# Patient Record
Sex: Female | Born: 1995 | Race: White | Hispanic: No | Smoking: Current every day smoker
Health system: Southern US, Community
[De-identification: ages and names within clinical notes are randomized; demographics above are authoritative.]

## PROBLEM LIST (undated history)

## (undated) DIAGNOSIS — T50901A Poisoning by unspecified drugs, medicaments and biological substances, accidental (unintentional), initial encounter: Secondary | ICD-10-CM

## (undated) DIAGNOSIS — G43909 Migraine, unspecified, not intractable, without status migrainosus: Secondary | ICD-10-CM

## (undated) DIAGNOSIS — F419 Anxiety disorder, unspecified: Secondary | ICD-10-CM

## (undated) DIAGNOSIS — K219 Gastro-esophageal reflux disease without esophagitis: Secondary | ICD-10-CM

## (undated) DIAGNOSIS — F32A Depression, unspecified: Secondary | ICD-10-CM

## (undated) DIAGNOSIS — F1110 Opioid abuse, uncomplicated: Secondary | ICD-10-CM

## (undated) DIAGNOSIS — R51 Headache: Secondary | ICD-10-CM

## (undated) DIAGNOSIS — A749 Chlamydial infection, unspecified: Secondary | ICD-10-CM

## (undated) DIAGNOSIS — I639 Cerebral infarction, unspecified: Secondary | ICD-10-CM

## (undated) DIAGNOSIS — F329 Major depressive disorder, single episode, unspecified: Secondary | ICD-10-CM

## (undated) DIAGNOSIS — R519 Headache, unspecified: Secondary | ICD-10-CM

## (undated) DIAGNOSIS — N179 Acute kidney failure, unspecified: Secondary | ICD-10-CM

## (undated) HISTORY — DX: Opioid abuse, uncomplicated: F11.10

## (undated) HISTORY — DX: Chlamydial infection, unspecified: A74.9

## (undated) HISTORY — DX: Cerebral infarction, unspecified: I63.9

## (undated) HISTORY — PX: TONGUE SURGERY: SHX810

---
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
## 2015-07-13 ENCOUNTER — Emergency Department (HOSPITAL_COMMUNITY)
Admission: EM | Admit: 2015-07-13 | Discharge: 2015-07-13 | Disposition: A | Discharge status: Home / Self Care | Payer: 59 | Attending: Emergency Medicine | Admitting: Emergency Medicine

## 2015-07-13 ENCOUNTER — Encounter (HOSPITAL_COMMUNITY): Payer: Self-pay | Admitting: Emergency Medicine

## 2015-07-13 DIAGNOSIS — F17210 Nicotine dependence, cigarettes, uncomplicated: Secondary | ICD-10-CM | POA: Insufficient documentation

## 2015-07-13 DIAGNOSIS — M2560 Stiffness of unspecified joint, not elsewhere classified: Secondary | ICD-10-CM

## 2015-07-13 DIAGNOSIS — M542 Cervicalgia: Secondary | ICD-10-CM | POA: Diagnosis not present

## 2015-07-13 DIAGNOSIS — R200 Anesthesia of skin: Secondary | ICD-10-CM

## 2015-07-13 DIAGNOSIS — M25561 Pain in right knee: Secondary | ICD-10-CM | POA: Diagnosis present

## 2015-07-13 DIAGNOSIS — M25571 Pain in right ankle and joints of right foot: Secondary | ICD-10-CM | POA: Diagnosis not present

## 2015-07-13 DIAGNOSIS — M2550 Pain in unspecified joint: Secondary | ICD-10-CM

## 2015-07-13 DIAGNOSIS — M79601 Pain in right arm: Secondary | ICD-10-CM | POA: Diagnosis not present

## 2015-07-13 LAB — BASIC METABOLIC PANEL
ANION GAP: 5 (ref 5–15)
BUN: 12 mg/dL (ref 6–20)
CHLORIDE: 108 mmol/L (ref 101–111)
CO2: 26 mmol/L (ref 22–32)
Calcium: 9 mg/dL (ref 8.9–10.3)
Creatinine, Ser: 0.59 mg/dL (ref 0.44–1.00)
GFR calc non Af Amer: >60 mL/min (ref >60)
Glucose, Bld: 122 mg/dL — ABNORMAL HIGH (ref 65–99)
Potassium: 3.7 mmol/L (ref 3.5–5.1)
Sodium: 139 mmol/L (ref 135–145)

## 2015-07-13 MED ORDER — IBUPROFEN 200 MG PO TABS
600.0000 mg | ORAL_TABLET | Freq: Four times a day (QID) | ORAL | Status: DC | PRN
Start: 2015-07-13 — End: 2015-07-13
  Administered 2015-07-13: 600 mg via ORAL
  Filled 2015-07-13: qty 3

## 2015-07-13 MED ORDER — IBUPROFEN 600 MG PO TABS: 600.0000 mg | ORAL_TABLET | Freq: Four times a day (QID) | ORAL | Status: DC | PRN

## 2015-07-13 MED ORDER — PREDNISONE 20 MG PO TABS: 40.0000 mg | ORAL_TABLET | Freq: Every day | ORAL | Status: DC

## 2015-07-13 NOTE — ED Provider Notes (Signed)
CSN: [id number]     Arrival date & time 07/13/15  6962 History   None    Chief Complaint  Patient presents with  . Numbness  . Joint Pain     (Consider location/radiation/quality/duration/timing/severity/associated sxs/prior Treatment) The history is provided by the patient.  Patient is a 20 yo F with no significant PMHx presenting to the ED complaining of numbness and stiffness in her fingers bilaterally for the past 1 month. States she is also experiencing intermittent right arm pain and numbness. Denies history of fall or trauma. Also reports having right knee and ankle pain and stiffness for a week. Denies fevers or chills. No prior history of joint problems. Denies any family history of rheumatological disorders. States she has had back problems since childhood because her "one hip is higher than the other" and she used to see a chiropracter regularly. Patient reports being sexually active with her boyfriend and does not use condoms. She denies having any vaginal discharge, odor, dysuria, or dyspareunia.   History reviewed. No pertinent past medical history.  Past Surgical History  Procedure Laterality Date  . Appendectomy    . Mouth surgery     Family History  Problem Relation Age of Onset  . Anemia Father    Social History  Substance Use Topics  . Smoking status: Current Every Day Smoker -- 1.00 packs/day for 4 years    Types: Cigarettes  . Smokeless tobacco: None  . Alcohol Use: No   OB History    No data available     Review of Systems  Constitutional: Negative for fever and chills.  HENT: Negative for congestion and sore throat.   Eyes: Negative for pain and visual disturbance.  Respiratory: Negative for cough, shortness of breath and wheezing.   Cardiovascular: Negative for chest pain and leg swelling.  Gastrointestinal: Negative for nausea and vomiting.  Genitourinary: Negative for flank pain and difficulty urinating.  Musculoskeletal: Positive for back pain,  arthralgias, neck pain and neck stiffness. Negative for joint swelling.  Skin: Negative for pallor and rash.  Neurological: Positive for numbness. Negative for dizziness.    Allergies  Other and Sulfa antibiotics  Home Medications   Prior to Admission medications   Medication Sig Start Date End Date Taking? Authorizing Provider  HYDROcodone-acetaminophen (NORCO) 7.5-325 MG tablet Take 1 tablet by mouth daily as needed for moderate pain.   Yes Historical Provider, MD  ranitidine (ZANTAC) 150 MG tablet Take 150 mg by mouth daily as needed for heartburn.   Yes Historical Provider, MD  ibuprofen (ADVIL,MOTRIN) 600 MG tablet Take 1 tablet (600 mg total) by mouth every 6 (six) hours as needed for moderate pain. 07/13/15   John Giovanni, MD  predniSONE (DELTASONE) 20 MG tablet Take 2 tablets (40 mg total) by mouth daily with breakfast. 07/13/15   John Giovanni, MD   BP 102/62 mmHg  Pulse 94  Temp(Src) 98.3 F (36.8 C) (Oral)  Resp 17  Ht  (1.575 m)  Wt 52.164 kg  BMI 21.03 kg/m2  SpO2 100%  LMP 06/25/2015 Physical Exam  Constitutional: She is oriented to person, place, and time. She appears well-developed and well-nourished. No distress.  HENT:  Head: Normocephalic and atraumatic.  Mouth/Throat: Oropharynx is clear and moist.  Eyes: EOM are normal. Pupils are equal, round, and reactive to light.  Neck: Normal range of motion. Neck supple.  Cardiovascular: Normal rate, regular rhythm and intact distal pulses.   Pulmonary/Chest: Effort normal and breath sounds normal. No respiratory  distress. She has no wheezes. She has no rales.  Abdominal: Soft. Bowel sounds are normal. She exhibits no distension. There is no tenderness.  Musculoskeletal:  Right upper extremity: Decreased ROM of shoulder and elbow joints. No erythema, increased warmth, or swelling of the joints. Decreased sensation to light touch. Hands: No erythema, increased warmth, or swelling of the joints. Grip strength  4/5 in the right hand and 5/5 in the left hand.  Knees: Normal normal ROM. No erythema, increased warmth, or swelling.   Ankle joints: No erythema, increased warmth, or swelling.  Strength 5/5 and sensation to light touch grossly intact in bilateral lower extremities.   Neurological: She is alert and oriented to person, place, and time. No cranial nerve deficit.  Skin: Skin is warm and dry.    ED Course  Procedures (including critical care time) Labs Review Labs Reviewed  BASIC METABOLIC PANEL - Abnormal; Notable for the following:    Glucose, Bld 122 (*)    All other components within normal limits    Imaging Review No results found. I have personally reviewed and evaluated these images and lab results as part of my medical decision-making.   EKG Interpretation None      MDM   Final diagnoses:  Joint pain  Numbness of arm  Stiffness in joint   Diffuse joint pains and numbness of the right upper extremity  Patient is complaining of numbness and stiffness in her fingers. Also experiencing intermittent right arm pain and numbness. In addition, right knee and ankle pain and stiffness. Denies fevers or chills. No prior history of joint problems. She is currently sexually active with her boyfriend and does not use condoms. However, denies having any vaginal discharge, odor, dysuria, or dyspareunia. BMP showing normal potassium. Her symptoms are concerning for a rheumatologic disorder. -Ibuprofen 600 mg q6 prn pain -Prednisone 40 mg daily for 5 days  -Patient has been advised to follow up with rheumatology as outpatient for further workup -She does mention having back problems/ malalignment since childhood, as such, has been advised to follow-up with orthopedics as outpatient.   John GiovanniVasundhra Duffy Dantonio, MD 07/13/15 1132  John GiovanniVasundhra Maryse Brierley, MD 07/13/15 [id number]  John GiovanniVasundhra Jaydrian Corpening, MD 07/13/15 1223  Blane OharaJoshua Zavitz, MD 07/13/15 1432

## 2015-07-13 NOTE — ED Notes (Signed)
Patient c/o bilat hand numbness that has been going on over a month occuring in mornings for couple hours after waking up then now intermittently during the day. Patient states that she is also having joint pain--hips, ankles, knees.

## 2015-07-13 NOTE — Discharge Instructions (Signed)
Take Ibuprofen as instructed for pain.  Take Prednisone 40 mg daily for 5 days as instructed.   Please call and make an appointment with:  [facility] [address] [place], [place] [address] [phone]  [facility] [address] [place], [place] [address]  [phone]  Joint Pain Joint pain, which is also called arthralgia, can be caused by many things. Joint pain often goes away when you follow your health care provider's instructions for relieving pain at home. However, joint pain can also be caused by conditions that require further treatment. Common causes of joint pain include:  Bruising in the area of the joint.  Overuse of the joint.  Wear and tear on the joints that occur with aging (osteoarthritis).  Various other forms of arthritis.  A buildup of a crystal form of uric acid in the joint (gout).  Infections of the joint (septic arthritis) or of the bone (osteomyelitis). Your health care provider may recommend medicine to help with the pain. If your joint pain continues, additional tests may be needed to diagnose your condition. HOME CARE INSTRUCTIONS Watch your condition for any changes. Follow these instructions as directed to lessen the pain that you are feeling.  Take medicines only as directed by your health care provider.  Rest the affected area for as long as your health care provider says that you should. If directed to do so, raise the painful joint above the level of your heart while you are sitting or lying down.  Do not do things that cause or worsen pain.  If directed, apply ice to the painful area:  Put ice in a plastic bag.  Place a towel between your skin and the bag.  Leave the ice on for 20 minutes, 2-3 times per day.  Wear an elastic bandage, splint, or sling as directed by your health care provider. Loosen the elastic bandage or splint if your fingers or toes become numb and tingle, or if  they turn cold and blue.  Begin exercising or stretching the affected area as directed by your health care provider. Ask your health care provider what types of exercise are safe for you.  Keep all follow-up visits as directed by your health care provider. This is important. SEEK MEDICAL CARE IF:  Your pain increases, and medicine does not help.  Your joint pain does not improve within 3 days.  You have increased bruising or swelling.  You have a fever.  You lose 10 lb (4.5 kg) or more without trying. SEEK IMMEDIATE MEDICAL CARE IF:  You are not able to move the joint.  Your fingers or toes become numb or they turn cold and blue.   This information is not intended to replace advice given to you by your health care provider. Make sure you discuss any questions you have with your health care provider.   Document Released: 01/27/2005 Document Revised: 02/17/2014 Document Reviewed: 11/08/2013 Elsevier Interactive Patient Education [employer]2016 [employer]

## 2015-12-16 ENCOUNTER — Emergency Department (HOSPITAL_COMMUNITY): Payer: 59

## 2015-12-16 ENCOUNTER — Emergency Department (HOSPITAL_COMMUNITY)
Admission: EM | Admit: 2015-12-16 | Discharge: 2015-12-16 | Disposition: A | Discharge status: Home / Self Care | Payer: 59 | Attending: Emergency Medicine | Admitting: Emergency Medicine

## 2015-12-16 ENCOUNTER — Encounter (HOSPITAL_COMMUNITY): Payer: Self-pay

## 2015-12-16 DIAGNOSIS — Y9389 Activity, other specified: Secondary | ICD-10-CM | POA: Insufficient documentation

## 2015-12-16 DIAGNOSIS — F17210 Nicotine dependence, cigarettes, uncomplicated: Secondary | ICD-10-CM | POA: Diagnosis not present

## 2015-12-16 DIAGNOSIS — Y999 Unspecified external cause status: Secondary | ICD-10-CM | POA: Insufficient documentation

## 2015-12-16 DIAGNOSIS — S39012A Strain of muscle, fascia and tendon of lower back, initial encounter: Secondary | ICD-10-CM | POA: Diagnosis not present

## 2015-12-16 DIAGNOSIS — S199XXA Unspecified injury of neck, initial encounter: Secondary | ICD-10-CM | POA: Diagnosis present

## 2015-12-16 DIAGNOSIS — Y92410 Unspecified street and highway as the place of occurrence of the external cause: Secondary | ICD-10-CM | POA: Diagnosis not present

## 2015-12-16 DIAGNOSIS — S161XXA Strain of muscle, fascia and tendon at neck level, initial encounter: Secondary | ICD-10-CM | POA: Diagnosis not present

## 2015-12-16 LAB — I-STAT BETA HCG BLOOD, ED (MC, WL, AP ONLY)

## 2015-12-16 MED ORDER — HYDROMORPHONE HCL 2 MG/ML IJ SOLN
0.5000 mg | Freq: Once | INTRAMUSCULAR | Status: AC
  Administered 2015-12-16: 0.5 mg via INTRAVENOUS
  Filled 2015-12-16: qty 1

## 2015-12-16 MED ORDER — ONDANSETRON HCL 4 MG/2ML IJ SOLN
4.0000 mg | Freq: Once | INTRAMUSCULAR | Status: AC
  Administered 2015-12-16: 4 mg via INTRAVENOUS
  Filled 2015-12-16: qty 2

## 2015-12-16 NOTE — ED Notes (Signed)
Patient transported to CT 

## 2015-12-16 NOTE — ED Notes (Signed)
Xray notified Asher MuirJamie RN that negative pregnancy needed to complete tests. I-stat ordered.

## 2015-12-16 NOTE — ED Notes (Signed)
Pt to radiology via stretcher.  

## 2015-12-16 NOTE — ED Notes (Signed)
ED Provider at bedside. 
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
## 2015-12-18 ENCOUNTER — Emergency Department (HOSPITAL_COMMUNITY)
Admission: EM | Admit: 2015-12-18 | Discharge: 2015-12-18 | Disposition: A | Discharge status: Home / Self Care | Payer: 59 | Attending: Emergency Medicine | Admitting: Emergency Medicine

## 2015-12-18 ENCOUNTER — Encounter (HOSPITAL_COMMUNITY): Payer: Self-pay | Admitting: Emergency Medicine

## 2015-12-18 ENCOUNTER — Emergency Department (HOSPITAL_COMMUNITY): Payer: 59

## 2015-12-18 DIAGNOSIS — Y939 Activity, unspecified: Secondary | ICD-10-CM | POA: Diagnosis not present

## 2015-12-18 DIAGNOSIS — F17210 Nicotine dependence, cigarettes, uncomplicated: Secondary | ICD-10-CM | POA: Insufficient documentation

## 2015-12-18 DIAGNOSIS — S0990XA Unspecified injury of head, initial encounter: Secondary | ICD-10-CM | POA: Diagnosis not present

## 2015-12-18 DIAGNOSIS — F0781 Postconcussional syndrome: Secondary | ICD-10-CM | POA: Diagnosis not present

## 2015-12-18 DIAGNOSIS — Y92410 Unspecified street and highway as the place of occurrence of the external cause: Secondary | ICD-10-CM | POA: Diagnosis not present

## 2015-12-18 DIAGNOSIS — Y999 Unspecified external cause status: Secondary | ICD-10-CM | POA: Insufficient documentation

## 2015-12-18 MED ORDER — PROCHLORPERAZINE EDISYLATE 5 MG/ML IJ SOLN
10.0000 mg | Freq: Once | INTRAMUSCULAR | Status: AC
  Administered 2015-12-18: 10 mg via INTRAVENOUS
  Filled 2015-12-18: qty 2

## 2015-12-18 MED ORDER — DIPHENHYDRAMINE HCL 50 MG/ML IJ SOLN
12.5000 mg | Freq: Once | INTRAMUSCULAR | Status: AC
  Administered 2015-12-18: 12.5 mg via INTRAVENOUS
  Filled 2015-12-18: qty 1
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
## 2015-12-18 NOTE — Discharge Instructions (Signed)
Follow up with your primary care provider or with neurology for re-evaluation if your symptoms do not improve. Avoid strenuous activity until symptoms resolve. Return to the ED if you experience severe worsening of your symptoms, vomiting, severe worsening of your headache, change in vision.
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
## 2015-12-18 NOTE — ED Notes (Signed)
Patient transported to CT 

## 2015-12-18 NOTE — ED Notes (Signed)
Pt returned from CT °

## 2016-02-02 ENCOUNTER — Encounter (HOSPITAL_COMMUNITY): Payer: Self-pay | Admitting: Emergency Medicine

## 2016-02-02 ENCOUNTER — Emergency Department (HOSPITAL_COMMUNITY)
Admission: EM | Admit: 2016-02-02 | Discharge: 2016-02-03 | Disposition: A | Discharge status: Home / Self Care | Payer: 59 | Attending: Emergency Medicine | Admitting: Emergency Medicine

## 2016-02-02 DIAGNOSIS — Z202 Contact with and (suspected) exposure to infections with a predominantly sexual mode of transmission: Secondary | ICD-10-CM | POA: Insufficient documentation

## 2016-02-02 DIAGNOSIS — R112 Nausea with vomiting, unspecified: Secondary | ICD-10-CM | POA: Diagnosis not present

## 2016-02-02 DIAGNOSIS — R1030 Lower abdominal pain, unspecified: Secondary | ICD-10-CM

## 2016-02-02 DIAGNOSIS — F17210 Nicotine dependence, cigarettes, uncomplicated: Secondary | ICD-10-CM | POA: Diagnosis not present

## 2016-02-02 DIAGNOSIS — R197 Diarrhea, unspecified: Secondary | ICD-10-CM | POA: Insufficient documentation

## 2016-02-02 DIAGNOSIS — Z711 Person with feared health complaint in whom no diagnosis is made: Secondary | ICD-10-CM

## 2016-02-02 LAB — COMPREHENSIVE METABOLIC PANEL
ALT: 11 U/L — AB (ref 14–54)
AST: 14 U/L — AB (ref 15–41)
Albumin: 4.5 g/dL (ref 3.5–5.0)
Alkaline Phosphatase: 64 U/L (ref 38–126)
Anion gap: 6 (ref 5–15)
BUN: 17 mg/dL (ref 6–20)
CHLORIDE: 103 mmol/L (ref 101–111)
CO2: 24 mmol/L (ref 22–32)
CREATININE: 0.66 mg/dL (ref 0.44–1.00)
Calcium: 9 mg/dL (ref 8.9–10.3)
GFR calc non Af Amer: >60 mL/min (ref >60)
Glucose, Bld: 90 mg/dL (ref 65–99)
POTASSIUM: 3.8 mmol/L (ref 3.5–5.1)
SODIUM: 133 mmol/L — AB (ref 135–145)
Total Bilirubin: 1.5 mg/dL — ABNORMAL HIGH (ref 0.3–1.2)
Total Protein: 7 g/dL (ref 6.5–8.1)

## 2016-02-02 LAB — URINALYSIS, ROUTINE W REFLEX MICROSCOPIC
BACTERIA UA: NONE SEEN
BILIRUBIN URINE: NEGATIVE
Glucose, UA: NEGATIVE mg/dL
HGB URINE DIPSTICK: NEGATIVE
KETONES UR: 5 mg/dL — AB
Leukocytes, UA: NEGATIVE
Nitrite: NEGATIVE
PROTEIN: 100 mg/dL — AB
SPECIFIC GRAVITY, URINE: 1.03 (ref 1.005–1.030)
pH: 5 (ref 5.0–8.0)

## 2016-02-02 LAB — CBC
HEMATOCRIT: 38.4 % (ref 36.0–46.0)
Hemoglobin: 13 g/dL (ref 12.0–15.0)
MCH: 30.2 pg (ref 26.0–34.0)
MCHC: 33.9 g/dL (ref 30.0–36.0)
MCV: 89.3 fL (ref 78.0–100.0)
PLATELETS: 266 10*3/uL (ref 150–400)
RBC: 4.3 MIL/uL (ref 3.87–5.11)
RDW: 13 % (ref 11.5–15.5)
WBC: 11.9 10*3/uL — ABNORMAL HIGH (ref 4.0–10.5)

## 2016-02-02 LAB — I-STAT BETA HCG BLOOD, ED (MC, WL, AP ONLY)

## 2016-02-02 LAB — LIPASE, BLOOD: LIPASE: 19 U/L (ref 11–51)

## 2016-02-02 MED ORDER — DICYCLOMINE HCL 10 MG PO CAPS
10.0000 mg | ORAL_CAPSULE | Freq: Once | ORAL | Status: AC
  Administered 2016-02-02: 10 mg via ORAL
  Filled 2016-02-02: qty 1

## 2016-02-02 MED ORDER — SODIUM CHLORIDE 0.9 % IV BOLUS (SEPSIS)
1000.0000 mL | Freq: Once | INTRAVENOUS | Status: AC
  Administered 2016-02-02: 1000 mL via INTRAVENOUS

## 2016-02-02 MED ORDER — ONDANSETRON HCL 4 MG/2ML IJ SOLN
4.0000 mg | Freq: Once | INTRAMUSCULAR | Status: AC
  Administered 2016-02-02: 4 mg via INTRAVENOUS
  Filled 2016-02-02: qty 2

## 2016-02-02 NOTE — ED Provider Notes (Signed)
[facility] Provider Note   CSN: [id number] Arrival date & time: 02/02/16  2044     History   Chief Complaint Chief Complaint  Patient presents with  . Abdominal Pain    HPI Jane Watkins is a 20 y.o. female.  Jane Watkins is a 20 y.o. Female who presents to the ED with her boyfriend complaining of suprapubic abdominal pain ongoing for the past 5 days. She reports nausea with this pain and has had one episode of vomiting today. She also reports 2 episodes of diarrhea each day over the past 5 days. Previous abdominal surgery includes an appendectomy. Patient reports she is sexually active and does not use protection. She denies any vaginal bleeding or vaginal discharge. She reports having nausea earlier but ate some cheese-its which made her nausea better. Patient denies fevers, vaginal bleeding, vaginal discharge, urinary symptoms, hematemesis, hematochezia, recent antibiotic use, or rashes.   The history is provided by the patient. No language interpreter was used.  Abdominal Pain   Associated symptoms include diarrhea, nausea and vomiting. Pertinent negatives include fever, dysuria, frequency, hematuria and headaches.    History reviewed. No pertinent past medical history.  There are no active problems to display for this patient.   Past Surgical History:  Procedure Laterality Date  . APPENDECTOMY    . MOUTH SURGERY      OB History    No data available       Home Medications    Prior to Admission medications   Medication Sig Start Date End Date Taking? Authorizing Provider  ibuprofen (ADVIL,MOTRIN) 600 MG tablet Take 1 tablet (600 mg total) by mouth every 6 (six) hours as needed for moderate pain. 07/13/15  Yes John Giovanni, MD  dicyclomine (BENTYL) 20 MG tablet Take 1 tablet (20 mg total) by mouth 2 (two) times daily. 02/03/16   Everlene Farrier, PA-C  FLUoxetine (PROZAC) 20 MG tablet Take 30 mg by mouth daily.    Historical Provider, MD    HYDROcodone-acetaminophen (NORCO) 7.5-325 MG tablet Take 1 tablet by mouth daily as needed for moderate pain.    Historical Provider, MD  Lactobacillus (ACIDOPHILUS PROBIOTIC) 10 MG TABS Take 10 mg by mouth 3 (three) times daily. 02/03/16   Everlene Farrier, PA-C  metroNIDAZOLE (FLAGYL) 500 MG tablet Take 1 tablet (500 mg total) by mouth 2 (two) times daily. 02/03/16   Everlene Farrier, PA-C  ondansetron (ZOFRAN ODT) 4 MG disintegrating tablet Take 1 tablet (4 mg total) by mouth every 8 (eight) hours as needed for nausea or vomiting. 02/03/16   Everlene Farrier, PA-C  predniSONE (DELTASONE) 20 MG tablet Take 2 tablets (40 mg total) by mouth daily with breakfast. 07/13/15   John Giovanni, MD  ranitidine (ZANTAC) 150 MG tablet Take 150 mg by mouth daily as needed for heartburn.    Historical Provider, MD    Family History Family History  Problem Relation Age of Onset  . Anemia Father     Social History Social History  Substance Use Topics  . Smoking status: Current Every Day Smoker    Packs/day: 1.00    Years: 4.00    Types: Cigarettes  . Smokeless tobacco: Never Used  . Alcohol use No     Allergies   Other and Sulfa antibiotics   Review of Systems Review of Systems  Constitutional: Negative for chills and fever.  HENT: Negative for congestion and sore throat.   Eyes: Negative for visual disturbance.  Respiratory: Negative for cough, shortness of breath and  wheezing.   Cardiovascular: Negative for chest pain.  Gastrointestinal: Positive for abdominal pain, diarrhea, nausea and vomiting. Negative for blood in stool.  Genitourinary: Negative for difficulty urinating, dysuria, flank pain, frequency, hematuria, menstrual problem, pelvic pain, urgency, vaginal bleeding and vaginal discharge.  Musculoskeletal: Negative for back pain and neck pain.  Skin: Negative for rash.  Neurological: Negative for dizziness, light-headedness and headaches.     Physical Exam Updated Vital  Signs BP 122/74 (BP Location: Right Arm)   Pulse 76   Temp 97.6 F (36.4 C) (Oral)   Resp 16   Ht 5\' 2"  (1.575 m)   Wt 52.2 kg   LMP 12/16/2015   SpO2 95%   BMI 21.03 kg/m   Physical Exam  Constitutional: She appears well-developed and well-nourished. No distress.  Nontoxic appearing.  HENT:  Head: Normocephalic and atraumatic.  Mouth/Throat: Oropharynx is clear and moist.  Eyes: Conjunctivae are normal. Pupils are equal, round, and reactive to light. Right eye exhibits no discharge. Left eye exhibits no discharge.  Neck: Neck supple.  Cardiovascular: Normal rate, regular rhythm, normal heart sounds and intact distal pulses.  Exam reveals no gallop and no friction rub.   No murmur heard. Pulmonary/Chest: Effort normal and breath sounds normal. No respiratory distress. She has no wheezes. She has no rales.  Abdominal: Soft. Bowel sounds are normal. She exhibits no distension and no mass. There is tenderness. There is no rebound and no guarding. No hernia.  Abdomen is soft. Bowel sounds are present. Patient has mild suprapubic, right lower quadrant and left lower quadrant abdominal tenderness to palpation. No peritoneal signs. No psoas or obturator sign. No CVA or flank tenderness. No rebound tenderness.  Genitourinary:  Genitourinary Comments: Pelvic exam by myself with female NT as chaperone. No external rashes or lesions. Mild amount of vaginal discharge. No vaginal bleeding. Cervix is closed. No CMT. No adnexal tenderness or fullness.   Musculoskeletal: She exhibits no edema.  Lymphadenopathy:    She has no cervical adenopathy.  Neurological: She is alert. Coordination normal.  Skin: Skin is warm and dry. Capillary refill takes less than 2 seconds. No rash noted. She is not diaphoretic. No erythema. No pallor.  Psychiatric: She has a normal mood and affect. Her behavior is normal.  Nursing note and vitals reviewed.    ED Treatments / Results  Labs (all labs ordered are  listed, but only abnormal results are displayed) Labs Reviewed  WET PREP, GENITAL - Abnormal; Notable for the following:       Result Value   Clue Cells Wet Prep HPF POC PRESENT (*)    WBC, Wet Prep HPF POC MANY (*)    All other components within normal limits  COMPREHENSIVE METABOLIC PANEL - Abnormal; Notable for the following:    Sodium 133 (*)    AST 14 (*)    ALT 11 (*)    Total Bilirubin 1.5 (*)    All other components within normal limits  CBC - Abnormal; Notable for the following:    WBC 11.9 (*)    All other components within normal limits  URINALYSIS, ROUTINE W REFLEX MICROSCOPIC - Abnormal; Notable for the following:    APPearance CLOUDY (*)    Ketones, ur 5 (*)    Protein, ur 100 (*)    Squamous Epithelial / LPF 0-5 (*)    All other components within normal limits  LIPASE, BLOOD  RPR  HIV ANTIBODY (ROUTINE TESTING)  I-STAT BETA HCG BLOOD, ED ([facility], [facility],  [facility] ONLY)  GC/CHLAMYDIA PROBE AMP ([place]) NOT AT [facility]    EKG  EKG Interpretation None       Radiology No results found.  Procedures Procedures (including critical care time)  Medications Ordered in ED Medications  cefTRIAXone (ROCEPHIN) injection 250 mg (not administered)  azithromycin (ZITHROMAX) tablet 1,000 mg (not administered)  sodium chloride 0.9 % bolus 1,000 mL (1,000 mLs Intravenous New Bag/Given 02/02/16 2340)  ondansetron (ZOFRAN) injection 4 mg (4 mg Intravenous Given 02/02/16 2340)  dicyclomine (BENTYL) capsule 10 mg (10 mg Oral Given 02/02/16 2340)     Initial Impression / Assessment and Plan / ED Course  I have reviewed the triage vital signs and the nursing notes.  Pertinent labs & imaging results that were available during my care of the patient were reviewed by me and considered in my medical decision making (see chart for details).  Clinical Course    This is a 20 y.o. Female who presents to the ED with her boyfriend complaining of suprapubic abdominal pain ongoing for the past  5 days. She reports nausea with this pain and has had one episode of vomiting today. She also reports 2 episodes of diarrhea each day over the past 5 days. Previous abdominal surgery includes an appendectomy. Patient reports she is sexually active and does not use protection. She denies any vaginal bleeding or vaginal discharge. She reports having nausea earlier but ate some cheese-its which made her nausea better.  On exam patient is afebrile nontoxic appearing. Patient has mild bilateral lower abdominal tenderness to palpation suprapubic tenderness palpation. No peritoneal signs. On pelvic exam she has no cervical motion tenderness. No adnexal tenderness or fullness. Mild amount of vaginal discharge noted. Pregnancy test is negative. Urinalysis is nitrite and leukocyte negative. Lipase is within normal limits. CMP is unremarkable. CBC is word wallet for white count of 11,900. Wet prep is remarkable for clue cells and many white blood cells. Patient has pending testing for gonorrhea, chlamydia, RPR and HIV. I discussed the test results with the patient. As she has many white blood cells and is sexually active without protection with a curet and treat her for gonorrhea chlamydia with Rocephin and azithromycin. Patient agrees to this plan. I have low suspicion for PID is patient has no cervical motion tenderness. She tells me she believes her pain was higher than this. She does not believe she is having pelvic pain. No pain on pelvic exam. Will also treat with Flagyl for bacterial vaginosis. After fluid bolus, Bentyl and Zofran patient reports she is doing much better. She is tolerating by mouth without nausea or vomiting. Repeat abdominal exam is benign. No abdominal tenderness to palpation. Suspect viral GI illness. We'll discharge with prescriptions for Zofran, Bentyl, probiotic and Flagyl. Discussed safe sex practices. I encouraged her to follow-up on her STD testing. I discussed strict and specific return  precautions. I advised the patient to follow-up with their primary care provider this week. I advised the patient to return to the emergency department with new or worsening symptoms or new concerns. The patient verbalized understanding and agreement with plan.    Final Clinical Impressions(s) / ED Diagnoses   Final diagnoses:  Concern about STD in female without diagnosis  Lower abdominal pain  Nausea vomiting and diarrhea    New Prescriptions New Prescriptions   DICYCLOMINE (BENTYL) 20 MG TABLET    Take 1 tablet (20 mg total) by mouth 2 (two) times daily.   LACTOBACILLUS (ACIDOPHILUS PROBIOTIC) 10 MG  TABS    Take 10 mg by mouth 3 (three) times daily.   METRONIDAZOLE (FLAGYL) 500 MG TABLET    Take 1 tablet (500 mg total) by mouth 2 (two) times daily.   ONDANSETRON (ZOFRAN ODT) 4 MG DISINTEGRATING TABLET    Take 1 tablet (4 mg total) by mouth every 8 (eight) hours as needed for nausea or vomiting.     Everlene FarrierWilliam Brandun Pinn, PA-C 02/03/16 [id number]    Dione Boozeavid Glick, MD 02/03/16 [id number]

## 2016-02-02 NOTE — ED Triage Notes (Signed)
Pt c/o lower abdominal pain x1 week, pointing to the lower quadrants of her stomach. C/o tenderness, cold chills, cotton mouth, n/v/d.

## 2016-02-03 LAB — WET PREP, GENITAL
Sperm: NONE SEEN
Trich, Wet Prep: NONE SEEN
Yeast Wet Prep HPF POC: NONE SEEN

## 2016-02-03 LAB — HIV ANTIBODY (ROUTINE TESTING W REFLEX): HIV SCREEN 4TH GENERATION: NONREACTIVE

## 2016-02-03 LAB — RPR: RPR: NONREACTIVE

## 2016-02-03 MED ORDER — AZITHROMYCIN 250 MG PO TABS
1000.0000 mg | ORAL_TABLET | Freq: Once | ORAL | Status: AC
  Administered 2016-02-03: 1000 mg via ORAL
  Filled 2016-02-03: qty 4

## 2016-02-03 MED ORDER — ACIDOPHILUS PROBIOTIC 10 MG PO TABS: 10.0000 mg | ORAL_TABLET | Freq: Three times a day (TID) | ORAL | 0 refills | Status: DC

## 2016-02-03 MED ORDER — STERILE WATER FOR INJECTION IJ SOLN
INTRAMUSCULAR | Status: AC
  Filled 2016-02-03: qty 10

## 2016-02-03 MED ORDER — DICYCLOMINE HCL 20 MG PO TABS: 20.0000 mg | ORAL_TABLET | Freq: Two times a day (BID) | ORAL | 0 refills | Status: DC

## 2016-02-03 MED ORDER — METRONIDAZOLE 500 MG PO TABS: 500.0000 mg | ORAL_TABLET | Freq: Two times a day (BID) | ORAL | 0 refills | Status: DC

## 2016-02-03 MED ORDER — ONDANSETRON 4 MG PO TBDP: 4.0000 mg | ORAL_TABLET | Freq: Three times a day (TID) | ORAL | 0 refills | Status: DC | PRN

## 2016-02-03 MED ORDER — CEFTRIAXONE SODIUM 250 MG IJ SOLR
250.0000 mg | Freq: Once | INTRAMUSCULAR | Status: DC
  Filled 2016-02-03: qty 250

## 2016-02-03 NOTE — ED Notes (Signed)
Pt stable, understands discharge instructions, and reasons for return.   

## 2016-02-05 LAB — GC/CHLAMYDIA PROBE AMP (~~LOC~~) NOT AT ARMC
Chlamydia: NEGATIVE
NEISSERIA GONORRHEA: NEGATIVE

## 2016-02-11 DIAGNOSIS — N179 Acute kidney failure, unspecified: Secondary | ICD-10-CM

## 2016-02-11 HISTORY — DX: Acute kidney failure, unspecified: N17.9

## 2016-04-10 DIAGNOSIS — T50901A Poisoning by unspecified drugs, medicaments and biological substances, accidental (unintentional), initial encounter: Secondary | ICD-10-CM

## 2016-04-10 HISTORY — PX: TRACHEOSTOMY: SUR1362

## 2016-04-10 HISTORY — DX: Poisoning by unspecified drugs, medicaments and biological substances, accidental (unintentional), initial encounter: T50.901A

## 2016-04-19 ENCOUNTER — Inpatient Hospital Stay (HOSPITAL_COMMUNITY)
Admission: EM | Admit: 2016-04-19 | Discharge: 2016-05-01 | DRG: 004 | Disposition: A | Discharge status: Rehabilitation Facility | Payer: 59 | Attending: Internal Medicine | Admitting: Internal Medicine

## 2016-04-19 ENCOUNTER — Emergency Department (HOSPITAL_COMMUNITY): Payer: 59

## 2016-04-19 ENCOUNTER — Encounter (HOSPITAL_COMMUNITY): Payer: Self-pay | Admitting: Emergency Medicine

## 2016-04-19 ENCOUNTER — Inpatient Hospital Stay (HOSPITAL_COMMUNITY): Payer: 59

## 2016-04-19 DIAGNOSIS — J810 Acute pulmonary edema: Secondary | ICD-10-CM

## 2016-04-19 DIAGNOSIS — T401X1A Poisoning by heroin, accidental (unintentional), initial encounter: Principal | ICD-10-CM | POA: Diagnosis present

## 2016-04-19 DIAGNOSIS — T40601A Poisoning by unspecified narcotics, accidental (unintentional), initial encounter: Secondary | ICD-10-CM | POA: Diagnosis not present

## 2016-04-19 DIAGNOSIS — M6282 Rhabdomyolysis: Secondary | ICD-10-CM | POA: Diagnosis present

## 2016-04-19 DIAGNOSIS — E872 Acidosis: Secondary | ICD-10-CM | POA: Diagnosis present

## 2016-04-19 DIAGNOSIS — Z0189 Encounter for other specified special examinations: Secondary | ICD-10-CM

## 2016-04-19 DIAGNOSIS — R0600 Dyspnea, unspecified: Secondary | ICD-10-CM | POA: Diagnosis not present

## 2016-04-19 DIAGNOSIS — T50902D Poisoning by unspecified drugs, medicaments and biological substances, intentional self-harm, subsequent encounter: Secondary | ICD-10-CM | POA: Diagnosis not present

## 2016-04-19 DIAGNOSIS — T40605A Adverse effect of unspecified narcotics, initial encounter: Secondary | ICD-10-CM | POA: Diagnosis not present

## 2016-04-19 DIAGNOSIS — R451 Restlessness and agitation: Secondary | ICD-10-CM | POA: Diagnosis not present

## 2016-04-19 DIAGNOSIS — R7989 Other specified abnormal findings of blood chemistry: Secondary | ICD-10-CM

## 2016-04-19 DIAGNOSIS — R1312 Dysphagia, oropharyngeal phase: Secondary | ICD-10-CM | POA: Diagnosis not present

## 2016-04-19 DIAGNOSIS — R40243 Glasgow coma scale score 3-8, unspecified time: Secondary | ICD-10-CM | POA: Diagnosis present

## 2016-04-19 DIAGNOSIS — N390 Urinary tract infection, site not specified: Secondary | ICD-10-CM | POA: Diagnosis present

## 2016-04-19 DIAGNOSIS — E876 Hypokalemia: Secondary | ICD-10-CM | POA: Diagnosis not present

## 2016-04-19 DIAGNOSIS — R0902 Hypoxemia: Secondary | ICD-10-CM

## 2016-04-19 DIAGNOSIS — Z9289 Personal history of other medical treatment: Secondary | ICD-10-CM

## 2016-04-19 DIAGNOSIS — G92 Toxic encephalopathy: Secondary | ICD-10-CM | POA: Diagnosis present

## 2016-04-19 DIAGNOSIS — T40601D Poisoning by unspecified narcotics, accidental (unintentional), subsequent encounter: Secondary | ICD-10-CM | POA: Diagnosis not present

## 2016-04-19 DIAGNOSIS — R061 Stridor: Secondary | ICD-10-CM | POA: Diagnosis not present

## 2016-04-19 DIAGNOSIS — B957 Other staphylococcus as the cause of diseases classified elsewhere: Secondary | ICD-10-CM | POA: Diagnosis not present

## 2016-04-19 DIAGNOSIS — I248 Other forms of acute ischemic heart disease: Secondary | ICD-10-CM | POA: Diagnosis present

## 2016-04-19 DIAGNOSIS — F1110 Opioid abuse, uncomplicated: Secondary | ICD-10-CM

## 2016-04-19 DIAGNOSIS — M7989 Other specified soft tissue disorders: Secondary | ICD-10-CM | POA: Diagnosis not present

## 2016-04-19 DIAGNOSIS — R748 Abnormal levels of other serum enzymes: Secondary | ICD-10-CM | POA: Diagnosis not present

## 2016-04-19 DIAGNOSIS — R7889 Finding of other specified substances, not normally found in blood: Secondary | ICD-10-CM | POA: Diagnosis not present

## 2016-04-19 DIAGNOSIS — J15212 Pneumonia due to Methicillin resistant Staphylococcus aureus: Secondary | ICD-10-CM | POA: Diagnosis not present

## 2016-04-19 DIAGNOSIS — J9600 Acute respiratory failure, unspecified whether with hypoxia or hypercapnia: Secondary | ICD-10-CM

## 2016-04-19 DIAGNOSIS — B9562 Methicillin resistant Staphylococcus aureus infection as the cause of diseases classified elsewhere: Secondary | ICD-10-CM | POA: Diagnosis present

## 2016-04-19 DIAGNOSIS — J690 Pneumonitis due to inhalation of food and vomit: Secondary | ICD-10-CM | POA: Diagnosis present

## 2016-04-19 DIAGNOSIS — Z930 Tracheostomy status: Secondary | ICD-10-CM | POA: Diagnosis not present

## 2016-04-19 DIAGNOSIS — T50901A Poisoning by unspecified drugs, medicaments and biological substances, accidental (unintentional), initial encounter: Secondary | ICD-10-CM | POA: Diagnosis present

## 2016-04-19 DIAGNOSIS — R402431 Glasgow coma scale score 3-8, in the field [EMT or ambulance]: Secondary | ICD-10-CM

## 2016-04-19 DIAGNOSIS — F17210 Nicotine dependence, cigarettes, uncomplicated: Secondary | ICD-10-CM | POA: Diagnosis not present

## 2016-04-19 DIAGNOSIS — R778 Other specified abnormalities of plasma proteins: Secondary | ICD-10-CM

## 2016-04-19 DIAGNOSIS — Z720 Tobacco use: Secondary | ICD-10-CM | POA: Diagnosis not present

## 2016-04-19 DIAGNOSIS — T50901D Poisoning by unspecified drugs, medicaments and biological substances, accidental (unintentional), subsequent encounter: Secondary | ICD-10-CM | POA: Diagnosis not present

## 2016-04-19 DIAGNOSIS — T50904S Poisoning by unspecified drugs, medicaments and biological substances, undetermined, sequela: Secondary | ICD-10-CM | POA: Diagnosis not present

## 2016-04-19 DIAGNOSIS — Z01818 Encounter for other preprocedural examination: Secondary | ICD-10-CM

## 2016-04-19 DIAGNOSIS — T40601S Poisoning by unspecified narcotics, accidental (unintentional), sequela: Secondary | ICD-10-CM | POA: Diagnosis not present

## 2016-04-19 DIAGNOSIS — J9601 Acute respiratory failure with hypoxia: Secondary | ICD-10-CM | POA: Diagnosis present

## 2016-04-19 DIAGNOSIS — G9340 Encephalopathy, unspecified: Secondary | ICD-10-CM | POA: Diagnosis not present

## 2016-04-19 DIAGNOSIS — N179 Acute kidney failure, unspecified: Secondary | ICD-10-CM | POA: Diagnosis not present

## 2016-04-19 DIAGNOSIS — T40604A Poisoning by unspecified narcotics, undetermined, initial encounter: Secondary | ICD-10-CM | POA: Diagnosis not present

## 2016-04-19 DIAGNOSIS — T40604S Poisoning by unspecified narcotics, undetermined, sequela: Secondary | ICD-10-CM | POA: Diagnosis not present

## 2016-04-19 DIAGNOSIS — R0603 Acute respiratory distress: Secondary | ICD-10-CM

## 2016-04-19 DIAGNOSIS — J9690 Respiratory failure, unspecified, unspecified whether with hypoxia or hypercapnia: Secondary | ICD-10-CM

## 2016-04-19 LAB — BLOOD GAS, ARTERIAL
Acid-Base Excess: 0.9 mmol/L (ref 0.0–2.0)
Acid-base deficit: 3.4 mmol/L — ABNORMAL HIGH (ref 0.0–2.0)
BICARBONATE: 21.4 mmol/L (ref 20.0–28.0)
BICARBONATE: 23.4 mmol/L (ref 20.0–28.0)
DRAWN BY: 235341
Drawn by: 22223
FIO2: 100
FIO2: 50
LHR: 14 {breaths}/min
MECHVT: 450 mL
MECHVT: 450 mL
O2 SAT: 99 %
O2 Saturation: 99.5 %
PCO2 ART: 41.3 mmHg (ref 32.0–48.0)
PCO2 ART: 44.2 mmHg (ref 32.0–48.0)
PEEP/CPAP: 5 cmH2O
PEEP: 5 cmH2O
PO2 ART: 395 mmHg — AB (ref 83.0–108.0)
Patient temperature: 37
RATE: 14 resp/min
pH, Arterial: 7.336 — ABNORMAL LOW (ref 7.350–7.450)
pH, Arterial: 7.353 (ref 7.350–7.450)
pO2, Arterial: 194 mmHg — ABNORMAL HIGH (ref 83.0–108.0)

## 2016-04-19 LAB — COMPREHENSIVE METABOLIC PANEL
ALBUMIN: 3.8 g/dL (ref 3.5–5.0)
ALK PHOS: 47 U/L (ref 38–126)
ALT: 142 U/L — ABNORMAL HIGH (ref 14–54)
AST: 174 U/L — AB (ref 15–41)
Anion gap: 7 (ref 5–15)
BILIRUBIN TOTAL: 1.3 mg/dL — AB (ref 0.3–1.2)
BUN: 24 mg/dL — AB (ref 6–20)
CO2: 21 mmol/L — ABNORMAL LOW (ref 22–32)
CREATININE: 0.74 mg/dL (ref 0.44–1.00)
Calcium: 8.7 mg/dL — ABNORMAL LOW (ref 8.9–10.3)
Chloride: 113 mmol/L — ABNORMAL HIGH (ref 101–111)
GFR calc Af Amer: >60 mL/min (ref >60)
GLUCOSE: 118 mg/dL — AB (ref 65–99)
Potassium: 4.4 mmol/L (ref 3.5–5.1)
Sodium: 141 mmol/L (ref 135–145)
TOTAL PROTEIN: 6.1 g/dL — AB (ref 6.5–8.1)

## 2016-04-19 LAB — URINALYSIS, ROUTINE W REFLEX MICROSCOPIC
Bilirubin Urine: NEGATIVE
GLUCOSE, UA: NEGATIVE mg/dL
Ketones, ur: NEGATIVE mg/dL
Leukocytes, UA: NEGATIVE
Nitrite: NEGATIVE
PROTEIN: NEGATIVE mg/dL
Specific Gravity, Urine: 1.016 (ref 1.005–1.030)
pH: 5 (ref 5.0–8.0)

## 2016-04-19 LAB — CBC WITH DIFFERENTIAL/PLATELET
BASOS ABS: 0 10*3/uL (ref 0.0–0.1)
BASOS PCT: 0 %
Eosinophils Absolute: 0 10*3/uL (ref 0.0–0.7)
Eosinophils Relative: 0 %
HEMATOCRIT: 40.7 % (ref 36.0–46.0)
HEMOGLOBIN: 13.9 g/dL (ref 12.0–15.0)
Lymphocytes Relative: 5 %
Lymphs Abs: 1.1 10*3/uL (ref 0.7–4.0)
MCH: 31.2 pg (ref 26.0–34.0)
MCHC: 34.2 g/dL (ref 30.0–36.0)
MCV: 91.3 fL (ref 78.0–100.0)
MONOS PCT: 12 %
Monocytes Absolute: 2.7 10*3/uL — ABNORMAL HIGH (ref 0.1–1.0)
NEUTROS ABS: 18 10*3/uL — AB (ref 1.7–7.7)
NEUTROS PCT: 83 %
Platelets: 261 10*3/uL (ref 150–400)
RBC: 4.46 MIL/uL (ref 3.87–5.11)
RDW: 12.7 % (ref 11.5–15.5)
WBC: 21.8 10*3/uL — ABNORMAL HIGH (ref 4.0–10.5)

## 2016-04-19 LAB — TROPONIN I: Troponin I: 1.5 ng/mL (ref <0.03)

## 2016-04-19 LAB — ACETAMINOPHEN LEVEL: Acetaminophen (Tylenol), Serum: <10 ug/mL — ABNORMAL LOW (ref 10–30)

## 2016-04-19 LAB — CBG MONITORING, ED: Glucose-Capillary: 84 mg/dL (ref 65–99)

## 2016-04-19 LAB — RAPID URINE DRUG SCREEN, HOSP PERFORMED
AMPHETAMINES: NOT DETECTED
BARBITURATES: NOT DETECTED
BENZODIAZEPINES: NOT DETECTED
COCAINE: NOT DETECTED
Opiates: POSITIVE — AB
Tetrahydrocannabinol: NOT DETECTED

## 2016-04-19 LAB — ETHANOL: Alcohol, Ethyl (B): <5 mg/dL (ref <5)

## 2016-04-19 LAB — CBC
HEMATOCRIT: 39 % (ref 36.0–46.0)
Hemoglobin: 12.9 g/dL (ref 12.0–15.0)
MCH: 30 pg (ref 26.0–34.0)
MCHC: 33.1 g/dL (ref 30.0–36.0)
MCV: 90.7 fL (ref 78.0–100.0)
PLATELETS: 220 10*3/uL (ref 150–400)
RBC: 4.3 MIL/uL (ref 3.87–5.11)
RDW: 12.9 % (ref 11.5–15.5)
WBC: 17.7 10*3/uL — ABNORMAL HIGH (ref 4.0–10.5)

## 2016-04-19 LAB — CK: CK TOTAL: 10284 U/L — AB (ref 38–234)

## 2016-04-19 LAB — LACTIC ACID, PLASMA
Lactic Acid, Venous: 3.4 mmol/L (ref 0.5–1.9)
Lactic Acid, Venous: 1 mmol/L (ref 0.5–1.9)

## 2016-04-19 LAB — SALICYLATE LEVEL: Salicylate Lvl: <7 mg/dL (ref 2.8–30.0)

## 2016-04-19 LAB — AMMONIA: Ammonia: 31 umol/L (ref 9–35)

## 2016-04-19 LAB — PREGNANCY, URINE: Preg Test, Ur: NEGATIVE

## 2016-04-19 MED ORDER — SODIUM CHLORIDE 0.9 % IV SOLN
0.5000 mg/h | INTRAVENOUS | Status: DC
  Administered 2016-04-19: 5 mg/h via INTRAVENOUS
  Filled 2016-04-19 (×2): qty 10

## 2016-04-19 MED ORDER — SUCCINYLCHOLINE CHLORIDE 20 MG/ML IJ SOLN
100.0000 mg | Freq: Once | INTRAMUSCULAR | Status: AC
  Administered 2016-04-19: 100 mg via INTRAVENOUS

## 2016-04-19 MED ORDER — MIDAZOLAM 50MG/50ML (1MG/ML) PREMIX INFUSION
INTRAVENOUS | Status: AC
  Filled 2016-04-19: qty 50

## 2016-04-19 MED ORDER — PROPOFOL 1000 MG/100ML IV EMUL
40.0000 ug/kg/min | Freq: Once | INTRAVENOUS | Status: AC
  Administered 2016-04-19 (×4): 40 ug/kg/min via INTRAVENOUS

## 2016-04-19 MED ORDER — SODIUM CHLORIDE 0.9 % IV SOLN: 250.0000 mL | INTRAVENOUS | Status: DC | PRN

## 2016-04-19 MED ORDER — ENOXAPARIN SODIUM 40 MG/0.4ML ~~LOC~~ SOLN
40.0000 mg | SUBCUTANEOUS | Status: DC
  Administered 2016-04-20 – 2016-05-01 (×11): 40 mg via SUBCUTANEOUS
  Filled 2016-04-19 (×12): qty 0.4

## 2016-04-19 MED ORDER — FENTANYL CITRATE (PF) 100 MCG/2ML IJ SOLN
INTRAMUSCULAR | Status: AC
  Filled 2016-04-19: qty 2

## 2016-04-19 MED ORDER — ETOMIDATE 2 MG/ML IV SOLN
20.0000 mg | Freq: Once | INTRAVENOUS | Status: AC
  Administered 2016-04-19: 20 mg via INTRAVENOUS

## 2016-04-19 MED ORDER — ACETAMINOPHEN 650 MG RE SUPP
650.0000 mg | Freq: Once | RECTAL | Status: AC
  Administered 2016-04-19: 650 mg via RECTAL
  Filled 2016-04-19: qty 1

## 2016-04-19 MED ORDER — MIDAZOLAM HCL 5 MG/5ML IJ SOLN
INTRAMUSCULAR | Status: AC
  Filled 2016-04-19: qty 5

## 2016-04-19 MED ORDER — PROPOFOL 1000 MG/100ML IV EMUL
INTRAVENOUS | Status: AC
  Administered 2016-04-19: 40 ug/kg/min via INTRAVENOUS
  Filled 2016-04-19: qty 100

## 2016-04-19 MED ORDER — FENTANYL CITRATE (PF) 100 MCG/2ML IJ SOLN
100.0000 ug | Freq: Once | INTRAMUSCULAR | Status: AC
  Administered 2016-04-19: 100 ug via INTRAVENOUS

## 2016-04-19 MED ORDER — NALOXONE HCL 2 MG/2ML IJ SOSY
PREFILLED_SYRINGE | INTRAMUSCULAR | Status: AC
  Filled 2016-04-19: qty 2

## 2016-04-19 MED ORDER — NALOXONE HCL 2 MG/2ML IJ SOSY
2.0000 mg | PREFILLED_SYRINGE | Freq: Once | INTRAMUSCULAR | Status: AC
  Administered 2016-04-19: 2 mg via INTRAVENOUS

## 2016-04-19 MED ORDER — PROPOFOL 1000 MG/100ML IV EMUL
5.0000 ug/kg/min | Freq: Once | INTRAVENOUS | Status: AC
  Administered 2016-04-19: 80 ug/kg/min via INTRAVENOUS
  Administered 2016-04-19: 40 ug/kg/min via INTRAVENOUS
  Administered 2016-04-19: 5 ug/kg/min via INTRAVENOUS
  Administered 2016-04-19: 80 ug/kg/min via INTRAVENOUS
  Filled 2016-04-19: qty 100

## 2016-04-19 MED ORDER — MIDAZOLAM HCL 2 MG/2ML IJ SOLN
4.0000 mg | Freq: Once | INTRAMUSCULAR | Status: AC
Start: 2016-04-19 — End: 2016-04-19
  Administered 2016-04-19: 4 mg via INTRAVENOUS

## 2016-04-19 MED ORDER — MIDAZOLAM HCL 10 MG/2ML IJ SOLN
INTRAMUSCULAR | Status: AC
  Administered 2016-04-19: 4 mg
  Filled 2016-04-19: qty 2

## 2016-04-19 MED ORDER — PIPERACILLIN-TAZOBACTAM 3.375 G IVPB 30 MIN
3.3750 g | Freq: Once | INTRAVENOUS | Status: AC
  Administered 2016-04-19: 3.375 g via INTRAVENOUS
  Filled 2016-04-19: qty 50

## 2016-04-19 NOTE — ED Notes (Signed)
CRITICAL VALUE ALERT  Critical value received:  Troponin 1.5  Date of notification:  04/19/16  Time of notification:  1838  Critical value read back:Yes.    Nurse who received alert:  Berdine DanceMandi Windsor Zirkelbach RN  MD notified (1st page):  Patria Maneampos  Time of first page:  1839  MD notified (2nd page):  Time of second page:  Responding MD:  Patria Maneampos  Time MD responded:  [id number]

## 2016-04-19 NOTE — ED Notes (Signed)
Attempted to contact pt father. No answer. Pt stepmother also listed as emergency contact. Pt stepmother notified and reported would be to see pt. AC notified of pt status as well.
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
## 2016-04-19 NOTE — ED Provider Notes (Addendum)
[facility]-EM[facility]ENCY DEPT Provider Note   CSN: [id number] Arrival date & time: 04/19/16  1709     History   Chief Complaint Chief Complaint  Patient presents with  . Drug Overdose    Level V caveat: Altered mental status  HPI Jane Watkins is a 21 y.o. female.  HPI Patient is a 21 year old female with a prior history of heroin abuse who was found minimally responsive lying on the bed next to another young person who was deceased.  It is unclear who contacted EMS or for what reason EMS was contacted.  EMS reports that she was normal.  Therefore the patient was tachycardic and hypoxic.  Brought in on nasal cannula.  Somewhat combative in route per EMS.  No Narcan was given.  History reviewed. No pertinent past medical history.  Patient Active Problem List   Diagnosis Date Noted  . Encephalopathy 04/19/2016    Past Surgical History:  Procedure Laterality Date  . APPENDECTOMY    . MOUTH SURGERY      OB History    No data available       Home Medications    Prior to Admission medications   Medication Sig Start Date End Date Taking? Authorizing Provider  dicyclomine (BENTYL) 20 MG tablet Take 1 tablet (20 mg total) by mouth 2 (two) times daily. 02/03/16   Everlene Farrier, PA-C  FLUoxetine (PROZAC) 20 MG tablet Take 30 mg by mouth daily.    Historical Provider, MD  HYDROcodone-acetaminophen (NORCO) 7.5-325 MG tablet Take 1 tablet by mouth daily as needed for moderate pain.    Historical Provider, MD  ibuprofen (ADVIL,MOTRIN) 600 MG tablet Take 1 tablet (600 mg total) by mouth every 6 (six) hours as needed for moderate pain. 07/13/15   John Giovanni, MD  Lactobacillus (ACIDOPHILUS PROBIOTIC) 10 MG TABS Take 10 mg by mouth 3 (three) times daily. 02/03/16   Everlene Farrier, PA-C  metroNIDAZOLE (FLAGYL) 500 MG tablet Take 1 tablet (500 mg total) by mouth 2 (two) times daily. 02/03/16   Everlene Farrier, PA-C  ondansetron (ZOFRAN ODT) 4 MG disintegrating tablet Take 1 tablet (4 mg  total) by mouth every 8 (eight) hours as needed for nausea or vomiting. 02/03/16   Everlene Farrier, PA-C  predniSONE (DELTASONE) 20 MG tablet Take 2 tablets (40 mg total) by mouth daily with breakfast. 07/13/15   John Giovanni, MD  ranitidine (ZANTAC) 150 MG tablet Take 150 mg by mouth daily as needed for heartburn.    Historical Provider, MD    Family History Family History  Problem Relation Age of Onset  . Anemia Father     Social History Social History  Substance Use Topics  . Smoking status: Current Every Day Smoker    Packs/day: 1.00    Years: 4.00    Types: Cigarettes  . Smokeless tobacco: Never Used  . Alcohol use No     Allergies   Other and Sulfa antibiotics   Review of Systems Review of Systems  Unable to perform ROS: Mental status change     Physical Exam Updated Vital Signs BP 117/81   Pulse (!) 126   Temp 101 F (38.3 C) (Rectal)   Resp 17   Ht 5\' 4"  (1.626 m)   Wt 120 lb (54.4 kg)   SpO2 100%   BMI 20.60 kg/m   Physical Exam  Constitutional: She appears well-developed and well-nourished.  HENT:  Head: Normocephalic and atraumatic.  Dried vomit covering her right side of her face and evident in  both of her nares  Eyes:  Dilated pupils bilaterally but still responsive to light  Neck: Neck supple. No tracheal deviation present.  Cardiovascular: Regular rhythm.   Tachycardic  Pulmonary/Chest: No stridor.  Rhonchi bilaterally.  No tachypnea  Abdominal: Soft. She exhibits no distension.  Genitourinary:  Genitourinary Comments: Normal external genitalia  Musculoskeletal: She exhibits no edema or deformity.  Full range of motion of bilateral upper and lower extremity joints  Lymphadenopathy:    She has no cervical adenopathy.  Neurological:  Decorticate posturing.  Does not follow commands.    Skin: Skin is warm and dry. There is pallor.  Psychiatric:  Unable to test  Nursing note and vitals reviewed.    ED Treatments / Results   Labs (all labs ordered are listed, but only abnormal results are displayed) Labs Reviewed  CBC WITH DIFFERENTIAL/PLATELET - Abnormal; Notable for the following:       Result Value   WBC 21.8 (*)    Neutro Abs 18.0 (*)    Monocytes Absolute 2.7 (*)    All other components within normal limits  COMPREHENSIVE METABOLIC PANEL - Abnormal; Notable for the following:    Chloride 113 (*)    CO2 21 (*)    Glucose, Bld 118 (*)    BUN 24 (*)    Calcium 8.7 (*)    Total Protein 6.1 (*)    AST 174 (*)    ALT 142 (*)    Total Bilirubin 1.3 (*)    All other components within normal limits  CK - Abnormal; Notable for the following:    Total CK 10,284 (*)    All other components within normal limits  LACTIC ACID, PLASMA - Abnormal; Notable for the following:    Lactic Acid, Venous 3.4 (*)    All other components within normal limits  TROPONIN I - Abnormal; Notable for the following:    Troponin I 1.50 (*)    All other components within normal limits  RAPID URINE DRUG SCREEN, HOSP PERFORMED - Abnormal; Notable for the following:    Opiates POSITIVE (*)    All other components within normal limits  BLOOD GAS, ARTERIAL - Abnormal; Notable for the following:    pH, Arterial 7.336 (*)    pO2, Arterial 395 (*)    Acid-base deficit 3.4 (*)    All other components within normal limits  ACETAMINOPHEN LEVEL - Abnormal; Notable for the following:    Acetaminophen (Tylenol), Serum <10 (*)    All other components within normal limits  URINALYSIS, ROUTINE W REFLEX MICROSCOPIC - Abnormal; Notable for the following:    APPearance HAZY (*)    Hgb urine dipstick LARGE (*)    Bacteria, UA RARE (*)    All other components within normal limits  BLOOD GAS, ARTERIAL - Abnormal; Notable for the following:    pO2, Arterial 194.0 (*)    All other components within normal limits  URINE CULTURE  AMMONIA  ETHANOL  PREGNANCY, URINE  SALICYLATE LEVEL  CBG MONITORING, ED    EKG  EKG  Interpretation  Date/Time:  Saturday April 19 2016 18:11:32 EST Ventricular Rate:  135 PR Interval:    QRS Duration: 79 QT Interval:  300 QTC Calculation: 450 R Axis:   47 Text Interpretation:  Sinus tachycardia No old tracing to compare Confirmed by Princessa Lesmeister  MD, Caryn Bee ([id number]) on 04/19/2016 6:45:23 PM       Radiology Ct Head Wo Contrast  Result Date: 04/19/2016 CLINICAL DATA:  21 year old  female with altered mental status -possible overdose. EXAM: CT HEAD WITHOUT CONTRAST TECHNIQUE: Contiguous axial images were obtained from the base of the skull through the vertex without intravenous contrast. COMPARISON:  12/18/2015 CT FINDINGS: Brain: No evidence of infarction, hemorrhage, hydrocephalus, extra-axial collection or mass lesion/mass effect. Vascular: No hyperdense vessel or unexpected calcification. Skull: Normal. Negative for fracture or focal lesion. Sinuses/Orbits: No acute finding. Other: None. IMPRESSION: Unremarkable noncontrast head CT. Electronically Signed   By: Harmon PierJeffrey  Hu M.D.   On: 04/19/2016 19:32   Dg Chest Port 1 View  Result Date: 04/19/2016 CLINICAL DATA:  ETT, drug overdose, it OG tube placement EXAM: PORTABLE CHEST 1 VIEW COMPARISON:  12/16/2015 FINDINGS: Endotracheal tube terminates 4 cm above the Watkins. Enteric tube courses into the proximal stomach. Mild hazy right infrahilar opacity, suspicious for aspiration. Left lung is clear. No pleural effusion or pneumothorax. The heart is normal in size. IMPRESSION: Endotracheal tube terminates 4 cm above the Watkins. Enteric tube courses into the proximal stomach. Mild hazy right infrahilar opacity, suspicious for aspiration. Electronically Signed   By: Charline BillsSriyesh  Krishnan M.D.   On: 04/19/2016 18:06      +++++++++++++++++++++++++++++++++++++++++++++++   Procedures .Critical Care Performed by: Azalia BilisAMPOS, Ikran Patman Authorized by: Azalia BilisAMPOS, Cina Klumpp   Critical care provider statement:    Critical care time (minutes):  45   Critical  care was necessary to treat or prevent imminent or life-threatening deterioration of the following conditions:  CNS failure or compromise and respiratory failure   Critical care was time spent personally by me on the following activities:  Development of treatment plan with patient or surrogate, discussions with consultants, evaluation of patient's response to treatment, examination of patient, obtaining history from patient or surrogate, ordering and performing treatments and interventions, ordering and review of laboratory studies, ordering and review of radiographic studies, pulse oximetry, re-evaluation of patient's condition, review of old charts and ventilator management      INTUBATION Performed by: Lyanne CoAMPOS,Ahmaad Neidhardt M Required items: required blood products, implants, devices, and special equipment available Patient identity confirmed: provided demographic data and hospital-assigned identification number Time out: Immediately prior to procedure a "time out" was called to verify the correct patient, procedure, equipment, support staff and site/side marked as required. Indications: respiratory failure Intubation method: Glidescope Laryngoscopy  Preoxygenation: BVM Sedatives: Etomidate Paralytic: Succinylcholine Tube Size: 7.5 cuffed Post-procedure assessment: chest rise and ETCO2 monitor Breath sounds: equal and absent over the epigastrium Tube secured with: ETT holder Chest x-ray interpreted by radiologist and me. Chest x-ray findings: endotracheal tube in appropriate position Patient tolerated the procedure well with no immediate complications.   ++++++++++++++++++++++++++++++++++++++++++++++++++++++++   Medications Ordered in ED Medications  midazolam (VERSED) 50 mg in sodium chloride 0.9 % 50 mL (1 mg/mL) infusion (0.5 mg/hr Intravenous Transfusing/Transfer 04/19/16 2121)  naloxone [facility](NARCAN) injection 2 mg (2 mg Intravenous Given 04/19/16 1716)  succinylcholine (ANECTINE) injection  100 mg (100 mg Intravenous Given 04/19/16 1723)  etomidate (AMIDATE) injection 20 mg (20 mg Intravenous Given 04/19/16 1723)  propofol (DIPRIVAN) 1000 MG/100ML infusion (5 mcg/kg/min  54.4 kg Intravenous Transfusing/Transfer 04/19/16 2120)  propofol (DIPRIVAN) 1000 MG/100ML infusion (40 mcg/kg/min  54.4 kg Intravenous Bolus from Bag 04/19/16 1757)  fentaNYL (SUBLIMAZE) injection 100 mcg (100 mcg Intravenous Given 04/19/16 1749)  midazolam (VERSED) 10 MG/2ML injection (4 mg  Given 04/19/16 1801)  piperacillin-tazobactam (ZOSYN) IVPB 3.375 g (0 g Intravenous Stopped 04/19/16 1902)  midazolam (VERSED) injection 4 mg (4 mg Intravenous Given 04/19/16 1826)  acetaminophen (TYLENOL) suppository 650 mg (650  mg Rectal Given 04/19/16 1904)     Initial Impression / Assessment and Plan / ED Course  I have reviewed the triage vital signs and the nursing notes.  Pertinent labs & imaging results that were available during my care of the patient were reviewed by me and considered in my medical decision making (see chart for details).    patient's urine drug screen was obtained prior to all medications given here in the emergency department.  It is positive for opioids.  Given her prior history of heroin abuse suspect this is read heroin overdose with prolonged downtime.  I'm most concerned about hypoxic encephalopathy given her prolonged downtime.  Intubated for airway protection as she was somewhat combative on arrival.  Elevated troponin elevated liver enzymes likely secondary to shock.  Started on IV Zosyn for presumed aspiration pneumonia with infiltrative pattern on x-ray and obvious vomit dried around both nares.  Patient will be transferred to [facility] intensive care unit.  Case was discussed with Dr. Levada Schilling of the ICU team.  Will work on transport with her critical care transport team at this time.  I had long discussions with both the father and stepmother who understand the critical nature of the  patient's illness.  Final Clinical Impressions(s) / ED Diagnoses   Final diagnoses:  Glasgow coma scale total score 3-8, in the field (EMT or ambulance) (HCC)  Encephalopathy acute  Elevated troponin  Elevated liver enzymes  Opioid abuse    New Prescriptions New Prescriptions   No medications on file     Azalia Bilis, MD 04/19/16 2138    Azalia Bilis, MD 04/19/16 2230

## 2016-04-19 NOTE — ED Notes (Signed)
CRITICAL VALUE ALERT  Critical value received:  Lactic acid 3.4  Date of notification:  04/19/16  Time of notification:  1825  Critical value read back:Yes.    Nurse who received alert:  Tilman NeatJennifer Demetrios Byron,RN  MD notified (1st page):  Dr. Patria Maneampos  Time of first page:  1825  MD notified (2nd page):  Time of second page:  Responding MD:  Dr. Patria Maneampos  Time MD responded:  [phone]

## 2016-04-20 ENCOUNTER — Inpatient Hospital Stay (HOSPITAL_COMMUNITY): Payer: 59

## 2016-04-20 DIAGNOSIS — J690 Pneumonitis due to inhalation of food and vomit: Secondary | ICD-10-CM

## 2016-04-20 DIAGNOSIS — T40604A Poisoning by unspecified narcotics, undetermined, initial encounter: Secondary | ICD-10-CM

## 2016-04-20 DIAGNOSIS — J9601 Acute respiratory failure with hypoxia: Secondary | ICD-10-CM

## 2016-04-20 LAB — BLOOD GAS, ARTERIAL
Acid-base deficit: 0.6 mmol/L (ref 0.0–2.0)
Bicarbonate: 22.8 mmol/L (ref 20.0–28.0)
Drawn by: 244851
FIO2: 0.6
LHR: 22 {breaths}/min
MECHVT: 450 mL
O2 SAT: 99.2 %
PATIENT TEMPERATURE: 100.3
PCO2 ART: 34.8 mmHg (ref 32.0–48.0)
PEEP/CPAP: 10 cmH2O
PH ART: 7.438 (ref 7.350–7.450)
PO2 ART: 191 mmHg — AB (ref 83.0–108.0)

## 2016-04-20 LAB — BASIC METABOLIC PANEL
Anion gap: 3 — ABNORMAL LOW (ref 5–15)
BUN: 16 mg/dL (ref 6–20)
CO2: 23 mmol/L (ref 22–32)
Calcium: 8.2 mg/dL — ABNORMAL LOW (ref 8.9–10.3)
Chloride: 112 mmol/L — ABNORMAL HIGH (ref 101–111)
Creatinine, Ser: 0.78 mg/dL (ref 0.44–1.00)
GFR calc Af Amer: >60 mL/min (ref >60)
GFR calc non Af Amer: >60 mL/min (ref >60)
Glucose, Bld: 94 mg/dL (ref 65–99)
Potassium: 3.6 mmol/L (ref 3.5–5.1)
Sodium: 138 mmol/L (ref 135–145)

## 2016-04-20 LAB — CK: Total CK: 7770 U/L — ABNORMAL HIGH (ref 38–234)

## 2016-04-20 LAB — MRSA PCR SCREENING: MRSA by PCR: POSITIVE — AB

## 2016-04-20 LAB — TROPONIN I
Troponin I: 2.75 ng/mL (ref <0.03)
Troponin I: 2.94 ng/mL (ref <0.03)
Troponin I: 2.34 ng/mL (ref <0.03)

## 2016-04-20 LAB — LACTIC ACID, PLASMA: Lactic Acid, Venous: 0.8 mmol/L (ref 0.5–1.9)

## 2016-04-20 LAB — TRIGLYCERIDES: TRIGLYCERIDES: 77 mg/dL (ref <150)

## 2016-04-20 MED ORDER — SODIUM CHLORIDE 0.9 % IV SOLN
INTRAVENOUS | Status: DC
  Administered 2016-04-20: 03:00:00 via INTRAVENOUS

## 2016-04-20 MED ORDER — SODIUM CHLORIDE 0.9 % IV BOLUS (SEPSIS)
1000.0000 mL | Freq: Once | INTRAVENOUS | Status: AC
  Administered 2016-04-20: 1000 mL via INTRAVENOUS

## 2016-04-20 MED ORDER — VANCOMYCIN HCL IN DEXTROSE 750-5 MG/150ML-% IV SOLN
750.0000 mg | Freq: Three times a day (TID) | INTRAVENOUS | Status: DC
  Administered 2016-04-21 – 2016-04-22 (×6): 750 mg via INTRAVENOUS
  Filled 2016-04-20 (×7): qty 150

## 2016-04-20 MED ORDER — LACTATED RINGERS IV SOLN
INTRAVENOUS | Status: DC
  Administered 2016-04-20 – 2016-04-24 (×4): via INTRAVENOUS

## 2016-04-20 MED ORDER — CHLORHEXIDINE GLUCONATE 0.12 % MT SOLN
OROMUCOSAL | Status: AC
  Filled 2016-04-20: qty 15

## 2016-04-20 MED ORDER — SODIUM CHLORIDE 0.9 % IV SOLN
3.0000 g | Freq: Three times a day (TID) | INTRAVENOUS | Status: DC
  Administered 2016-04-20 – 2016-04-23 (×9): 3 g via INTRAVENOUS
  Filled 2016-04-20 (×11): qty 3

## 2016-04-20 MED ORDER — ALBUTEROL SULFATE (2.5 MG/3ML) 0.083% IN NEBU
2.5000 mg | INHALATION_SOLUTION | RESPIRATORY_TRACT | Status: DC | PRN
  Administered 2016-04-29 – 2016-04-30 (×3): 2.5 mg via RESPIRATORY_TRACT
  Filled 2016-04-20 (×3): qty 3

## 2016-04-20 MED ORDER — ONDANSETRON HCL 4 MG/2ML IJ SOLN
4.0000 mg | Freq: Four times a day (QID) | INTRAMUSCULAR | Status: DC | PRN
  Administered 2016-04-28: 4 mg via INTRAVENOUS
  Filled 2016-04-20: qty 2

## 2016-04-20 MED ORDER — ORAL CARE MOUTH RINSE
15.0000 mL | OROMUCOSAL | Status: DC
  Administered 2016-04-20 – 2016-04-29 (×92): 15 mL via OROMUCOSAL

## 2016-04-20 MED ORDER — FENTANYL BOLUS VIA INFUSION
50.0000 ug | INTRAVENOUS | Status: DC | PRN
  Filled 2016-04-20: qty 50

## 2016-04-20 MED ORDER — VANCOMYCIN HCL IN DEXTROSE 1-5 GM/200ML-% IV SOLN
1000.0000 mg | Freq: Once | INTRAVENOUS | Status: AC
  Administered 2016-04-20: 1000 mg via INTRAVENOUS
  Filled 2016-04-20: qty 200

## 2016-04-20 MED ORDER — PROPOFOL 1000 MG/100ML IV EMUL
5.0000 ug/kg/min | INTRAVENOUS | Status: DC
  Administered 2016-04-20: 60 ug/kg/min via INTRAVENOUS
  Administered 2016-04-20 (×2): 40 ug/kg/min via INTRAVENOUS
  Administered 2016-04-21: 60 ug/kg/min via INTRAVENOUS
  Administered 2016-04-21: 40 ug/kg/min via INTRAVENOUS
  Administered 2016-04-21: 30 ug/kg/min via INTRAVENOUS
  Administered 2016-04-21: 60 ug/kg/min via INTRAVENOUS
  Administered 2016-04-21: 40 ug/kg/min via INTRAVENOUS
  Administered 2016-04-22: 60 ug/kg/min via INTRAVENOUS
  Administered 2016-04-22: 45 ug/kg/min via INTRAVENOUS
  Administered 2016-04-22: 40 ug/kg/min via INTRAVENOUS
  Administered 2016-04-22: 50 ug/kg/min via INTRAVENOUS
  Administered 2016-04-23: 60 ug/kg/min via INTRAVENOUS
  Filled 2016-04-20 (×14): qty 100

## 2016-04-20 MED ORDER — CHLORHEXIDINE GLUCONATE 0.12% ORAL RINSE (MEDLINE KIT)
15.0000 mL | Freq: Two times a day (BID) | OROMUCOSAL | Status: DC
  Administered 2016-04-20 – 2016-04-29 (×20): 15 mL via OROMUCOSAL

## 2016-04-20 MED ORDER — MIDAZOLAM HCL 2 MG/2ML IJ SOLN: 2.0000 mg | INTRAMUSCULAR | Status: DC | PRN

## 2016-04-20 MED ORDER — PANTOPRAZOLE SODIUM 40 MG IV SOLR
40.0000 mg | INTRAVENOUS | Status: DC
  Administered 2016-04-20 – 2016-04-27 (×8): 40 mg via INTRAVENOUS
  Filled 2016-04-20 (×8): qty 40

## 2016-04-20 MED ORDER — LACTATED RINGERS IV SOLN
INTRAVENOUS | Status: DC
  Administered 2016-04-20 (×3): via INTRAVENOUS

## 2016-04-20 MED ORDER — FENTANYL 2500MCG IN NS 250ML (10MCG/ML) PREMIX INFUSION
25.0000 ug/h | INTRAVENOUS | Status: DC
  Administered 2016-04-20: 50 ug/h via INTRAVENOUS
  Administered 2016-04-21: 25 ug/h via INTRAVENOUS
  Administered 2016-04-23: 250 ug/h via INTRAVENOUS
  Administered 2016-04-23: 350 ug/h via INTRAVENOUS
  Administered 2016-04-23: 200 ug/h via INTRAVENOUS
  Administered 2016-04-24: 300 ug/h via INTRAVENOUS
  Administered 2016-04-24: 350 ug/h via INTRAVENOUS
  Administered 2016-04-24 (×2): 325 ug/h via INTRAVENOUS
  Administered 2016-04-25 (×3): 375 ug/h via INTRAVENOUS
  Administered 2016-04-25: 325 ug/h via INTRAVENOUS
  Administered 2016-04-26: 250 ug/h via INTRAVENOUS
  Administered 2016-04-26 – 2016-04-27 (×2): 150 ug/h via INTRAVENOUS
  Administered 2016-04-28: 100 ug/h via INTRAVENOUS
  Administered 2016-04-29: 175 ug/h via INTRAVENOUS
  Filled 2016-04-20 (×21): qty 250

## 2016-04-20 NOTE — Progress Notes (Signed)
Pharmacy Antibiotic Note  Jane Watkins is a 21 y.o. female admitted on 04/19/2016 with pneumonia.  Pharmacy has been consulted for unasyn and vancomycin dosing.  Plan: Vancomycin 1000mg  IV x1 followed by 750mg  IV q8h Unasyn 3g IV q8h Monitor culture data, renal function and clinical course  Height: 5\' 4"  (162.6 cm) Weight: 127 lb 3.3 oz (57.7 kg) IBW/kg (Calculated) : 54.7  Temp (24hrs), Avg:99.2 F (37.3 C), Min:98.2 F (36.8 C), Max:101 F (38.3 C)   Recent Labs Lab 04/19/16 1742 04/19/16 2324 04/19/16 2325 04/20/16 0450  WBC 21.8* 17.7*  --   --   CREATININE 0.74 0.78  --   --   LATICACIDVEN 3.4*  --  1.0 0.8    Estimated Creatinine Clearance: 96.9 mL/min (by C-G formula based on SCr of 0.78 mg/dL).    Allergies  Allergen Reactions  . Other Rash    Tide detergent   . Sulfa Antibiotics Rash     Harland Germanndrew Alsace Dowd, Pharm D 04/20/2016 3:10 PM

## 2016-04-20 NOTE — Progress Notes (Signed)
Pharmacy Antibiotic Note  Laqueta CarinaLydia Skoglund is a 21 y.o. female admitted on 04/19/2016 with pneumonia.  Pharmacy has been consulted for unasyn dosing.  Plan: Unasyn 3g IV q8h Monitor culture data, renal function and clinical course  Height: 5\' 4"  (162.6 cm) Weight: 127 lb 3.3 oz (57.7 kg) IBW/kg (Calculated) : 54.7  Temp (24hrs), Avg:99.2 F (37.3 C), Min:98.2 F (36.8 C), Max:101 F (38.3 C)   Recent Labs Lab 04/19/16 1742 04/19/16 2324 04/19/16 2325 04/20/16 0450  WBC 21.8* 17.7*  --   --   CREATININE 0.74 0.78  --   --   LATICACIDVEN 3.4*  --  1.0 0.8    Estimated Creatinine Clearance: 96.9 mL/min (by C-G formula based on SCr of 0.78 mg/dL).    Allergies  Allergen Reactions  . Other Rash    Tide detergent   . Sulfa Antibiotics Rash     Arlean Hoppingorey M. Newman PiesBall, PharmD, BCPS Clinical Pharmacist [phone] 04/20/2016 2:59 PM

## 2016-04-20 NOTE — Progress Notes (Addendum)
PCCM Progress Note  Admission date: 04/19/2016 Referring provider: Dr. Patria Maneampos  CC: Altered mental status  HPI: 21 [facility]  She was intubated for airway protection.  Concern for aspiration.  She has hx of opiate abuse, and UDS positive for opiates.  Subjective: Hypoxic.  Vital signs: BP (!) 126/92   Pulse (!) 126   Temp 99.1 F (37.3 C) (Oral)   Resp (!) 28   Ht 5\' 4"  (1.626 m)   Wt 127 lb 3.3 oz (57.7 kg)   SpO2 95%   BMI 21.83 kg/m   Intake/output: I/O last 3 completed shifts: In: 3161.8 [I.V.:2111.8; IV Piggyback:1050] Out: 1225 [Urine:1225]  General: agitated Neuro: RASS +2 to -2 HEENT: ETT in place Cardiac: regular, tachycardic Chest: b/l crackles Abd: soft, non tender Ext: no edema Skin: no rashes   CMP Latest Ref Rng & Units 04/19/2016 04/19/2016 02/02/2016  Glucose 65 - 99 mg/dL 94 161(W118(H) 90  BUN 6 - 20 mg/dL 16 96(E24(H) 17  Creatinine 0.44 - 1.00 mg/dL 4.540.78 0.980.74 1.190.66  Sodium 135 - 145 mmol/L 138 141 133(L)  Potassium 3.5 - 5.1 mmol/L 3.6 4.4 3.8  Chloride 101 - 111 mmol/L 112(H) 113(H) 103  CO2 22 - 32 mmol/L 23 21(L) 24  Calcium 8.9 - 10.3 mg/dL 8.2(L) 8.7(L) 9.0  Total Protein 6.5 - 8.1 g/dL - 6.1(L) 7.0  Total Bilirubin 0.3 - 1.2 mg/dL - 1.3(H) 1.5(H)  Alkaline Phos 38 - 126 U/L - 47 64  AST 15 - 41 U/L - 174(H) 14(L)  ALT 14 - 54 U/L - 142(H) 11(L)     CBC Latest Ref Rng & Units 04/19/2016 04/19/2016 02/02/2016  WBC 4.0 - 10.5 K/uL 17.7(H) 21.8(H) 11.9(H)  Hemoglobin 12.0 - 15.0 g/dL 14.712.9 82.913.9 56.213.0  Hematocrit 36.0 - 46.0 % 39.0 40.7 38.4  Platelets 150 - 400 K/uL 220 261 266     ABG    Component Value Date/Time   PHART 7.353 04/19/2016 2120   PCO2ART 44.2 04/19/2016 2120   PO2ART 194.0 (H) 04/19/2016 2120   HCO3 23.4 04/19/2016 2120   ACIDBASEDEF 3.4 (H) 04/19/2016 1830   O2SAT 99.0 04/19/2016 2120     CBG (last 3)   Recent Labs  04/19/16 2024  GLUCAP 84     Imaging: Ct Head Wo  Contrast  Result Date: 04/19/2016 CLINICAL DATA:  21 year old female with altered mental status -possible overdose. EXAM: CT HEAD WITHOUT CONTRAST TECHNIQUE: Contiguous axial images were obtained from the base of the skull through the vertex without intravenous contrast. COMPARISON:  12/18/2015 CT FINDINGS: Brain: No evidence of infarction, hemorrhage, hydrocephalus, extra-axial collection or mass lesion/mass effect. Vascular: No hyperdense vessel or unexpected calcification. Skull: Normal. Negative for fracture or focal lesion. Sinuses/Orbits: No acute finding. Other: None. IMPRESSION: Unremarkable noncontrast head CT. Electronically Signed   By: Harmon PierJeffrey  Hu M.D.   On: 04/19/2016 19:32   Dg Chest Port 1 View  Result Date: 04/20/2016 CLINICAL DATA:  Initial evaluation for acute respiratory arrest. EXAM: PORTABLE CHEST 1 VIEW COMPARISON:  Prior radiograph from earlier the same day. FINDINGS: Endotracheal tube in place with tip positioned approximately 3.8 cm above the carina. Enteric tube courses in the the abdomen. Cardiac and mediastinal silhouettes are stable. Lungs are normally inflated. Previously seen right infrahilar opacity has largely cleared. No new focal airspace disease. No pulmonary edema or pleural effusion. No pneumothorax. Osseous structures unchanged. IMPRESSION: 1. Tip of the endotracheal tube approximately 3.8 cm above the carina. Enteric  tube courses in the the abdomen. 2. Near complete interval clearing of right infrahilar opacity. No new airspace disease identified. Electronically Signed   By: Rise Mu M.D.   On: 04/20/2016 00:40   Dg Chest Port 1 View  Result Date: 04/19/2016 CLINICAL DATA:  ETT, drug overdose, it OG tube placement EXAM: PORTABLE CHEST 1 VIEW COMPARISON:  12/16/2015 FINDINGS: Endotracheal tube terminates 4 cm above the carina. Enteric tube courses into the proximal stomach. Mild hazy right infrahilar opacity, suspicious for aspiration. Left lung is clear.  No pleural effusion or pneumothorax. The heart is normal in size. IMPRESSION: Endotracheal tube terminates 4 cm above the carina. Enteric tube courses into the proximal stomach. Mild hazy right infrahilar opacity, suspicious for aspiration. Electronically Signed   By: Charline Bills M.D.   On: 04/19/2016 18:06     Studies: CT head 3/10 >> negative  Antibiotics: Unasyn 3/11 >> Vancomycin 3/11 >>   Cultures: Sputum 3/11 >> Blood 3/11 >>  Lines/tubes: ETT 3/11 >>  Events: 3/11 Admit  Assessment/plan:  Acute hypoxic respiratory failure with compromised airway from opiate overdose. Tobacco abuse. - full vent support - f/u CXR, ABG - FiO2/PEEP to keep SpO2 > 92% - prn BDs  Aspiration pneumonia. MRSA positive. - check cultures, procalcitonin - start unasyn, vancomycin  Lactic acidosis > improved Rhabdomyolysis. - CPK - continue IV fluid  Elevated troponin. - likely demand ischemia - check Echo  Acute encephalopathy. Opiate overdose. - RASS goal -1 to -2  DVT prophylaxis - Lovenox SUP - protonix Nutrition - NPO Goals of care - full code  CC time 46 minutes  Coralyn Helling, MD [facility] Pulmonary/Critical Care 04/20/2016, 3:07 PM Pager:  [phone] After 3pm call: [phone]

## 2016-04-20 NOTE — Progress Notes (Signed)
RT called to room due to patients desats, sats in 80's upon my arrival, RN had placed pt back in rest mode and increased FIO2 to 100%. Pt recovering at this time. RT suctioned patient for moderate amount of thick brown substance, looks to be vomit more than secretions. Pt currently stable. Will cont to monitor

## 2016-04-20 NOTE — H&P (Signed)
PULMONARY / CRITICAL CARE MEDICINE   Name: Jane Watkins MRN: [id number] DOB: 15-Oct-1995    ADMISSION DATE:  04/19/2016  CHIEF COMPLAINT:  Unresponsive  HISTORY OF PRESENT ILLNESS:   [name] is a 21 y/o woman with a history of opiate abuse who was found next to her boyfriend (who was deceased). She was unresponsive. She was brought to [facility] where she was found to combative and was intubated. Her CK was 15k, and had a mild lactic acidosis. She was brought to [facility].  PAST MEDICAL HISTORY :  She  has no past medical history on file.  PAST SURGICAL HISTORY: She  has a past surgical history that includes Appendectomy and Mouth surgery.  Allergies  Allergen Reactions  . Other Rash    Tide detergent   . Sulfa Antibiotics Rash    No current facility-administered medications on file prior to encounter.    Current Outpatient Prescriptions on File Prior to Encounter  Medication Sig  . dicyclomine (BENTYL) 20 MG tablet Take 1 tablet (20 mg total) by mouth 2 (two) times daily.  Marland Kitchen FLUoxetine (PROZAC) 20 MG tablet Take 30 mg by mouth daily.  Marland Kitchen HYDROcodone-acetaminophen (NORCO) 7.5-325 MG tablet Take 1 tablet by mouth daily as needed for moderate pain.  Marland Kitchen ibuprofen (ADVIL,MOTRIN) 600 MG tablet Take 1 tablet (600 mg total) by mouth every 6 (six) hours as needed for moderate pain.  . Lactobacillus (ACIDOPHILUS PROBIOTIC) 10 MG TABS Take 10 mg by mouth 3 (three) times daily.  . metroNIDAZOLE (FLAGYL) 500 MG tablet Take 1 tablet (500 mg total) by mouth 2 (two) times daily.  . ondansetron (ZOFRAN ODT) 4 MG disintegrating tablet Take 1 tablet (4 mg total) by mouth every 8 (eight) hours as needed for nausea or vomiting.  . predniSONE (DELTASONE) 20 MG tablet Take 2 tablets (40 mg total) by mouth daily with breakfast.  . ranitidine (ZANTAC) 150 MG tablet Take 150 mg by mouth daily as needed for heartburn.    FAMILY HISTORY:  Her indicated that her father is alive.    SOCIAL HISTORY: She   reports that she has been smoking Cigarettes.  She has a 4.00 pack-year smoking history. She has never used smokeless tobacco. She reports that she does not drink alcohol or use drugs.  REVIEW OF SYSTEMS:   Cannot obtain at this time.  VITAL SIGNS: BP (!) 124/100   Pulse (!) 116   Temp 98.4 F (36.9 C) (Axillary)   Resp 15   Ht 5\' 4"  (1.626 m)   Wt 127 lb 3.3 oz (57.7 kg)   SpO2 100%   BMI 21.83 kg/m   HEMODYNAMICS:    VENTILATOR SETTINGS: Vent Mode: PRVC FiO2 (%):  [30 %-100 %] 30 % Set Rate:  [14 bmp] 14 bmp Vt Set:  [450 mL] 450 mL PEEP:  [5 cmH20] 5 cmH20 Plateau Pressure:  [15 cmH20-24 cmH20] 16 cmH20  INTAKE / OUTPUT: No intake/output data recorded.  PHYSICAL EXAMINATION: General:  Young woman, intubated and sedated. Neuro: Sedated (was on infusion of midazolam @ 5/hr,  HEENT:  MMM Cardiovascular:  Normal S1/S2 Lungs:  CTA Abdomen:  Soft Musculoskeletal:  No Deformities Skin:  No rashes, no needle marks  LABS:  BMET  Recent Labs Lab 04/19/16 1742 04/19/16 2324  NA 141 138  K 4.4 3.6  CL 113* 112*  CO2 21* 23  BUN 24* 16  CREATININE 0.74 0.78  GLUCOSE 118* 94    Electrolytes  Recent Labs Lab 04/19/16 1742  04/19/16 2324  CALCIUM 8.7* 8.2*    CBC  Recent Labs Lab 04/19/16 1742 04/19/16 2324  WBC 21.8* 17.7*  HGB 13.9 12.9  HCT 40.7 39.0  PLT 261 220    Coag's No results for input(s): APTT, INR in the last 168 hours.  Sepsis Markers  Recent Labs Lab 04/19/16 1742 04/19/16 2325  LATICACIDVEN 3.4* 1.0    ABG  Recent Labs Lab 04/19/16 1830 04/19/16 2120  PHART 7.336* 7.353  PCO2ART 41.3 44.2  PO2ART 395* 194.0*    Liver Enzymes  Recent Labs Lab 04/19/16 1742  AST 174*  ALT 142*  ALKPHOS 47  BILITOT 1.3*  ALBUMIN 3.8    Cardiac Enzymes  Recent Labs Lab 04/19/16 1742 04/19/16 2324  TROPONINI 1.50* 2.75*    Glucose  Recent Labs Lab 04/19/16 2024  GLUCAP 84    Imaging Ct Head Wo  Contrast  Result Date: 04/19/2016 CLINICAL DATA:  21 year old female with altered mental status -possible overdose. EXAM: CT HEAD WITHOUT CONTRAST TECHNIQUE: Contiguous axial images were obtained from the base of the skull through the vertex without intravenous contrast. COMPARISON:  12/18/2015 CT FINDINGS: Brain: No evidence of infarction, hemorrhage, hydrocephalus, extra-axial collection or mass lesion/mass effect. Vascular: No hyperdense vessel or unexpected calcification. Skull: Normal. Negative for fracture or focal lesion. Sinuses/Orbits: No acute finding. Other: None. IMPRESSION: Unremarkable noncontrast head CT. Electronically Signed   By: Harmon Pier M.D.   On: 04/19/2016 19:32   Dg Chest Port 1 View  Result Date: 04/20/2016 CLINICAL DATA:  Initial evaluation for acute respiratory arrest. EXAM: PORTABLE CHEST 1 VIEW COMPARISON:  Prior radiograph from earlier the same day. FINDINGS: Endotracheal tube in place with tip positioned approximately 3.8 cm above the Watkins. Enteric tube courses in the the abdomen. Cardiac and mediastinal silhouettes are stable. Lungs are normally inflated. Previously seen right infrahilar opacity has largely cleared. No new focal airspace disease. No pulmonary edema or pleural effusion. No pneumothorax. Osseous structures unchanged. IMPRESSION: 1. Tip of the endotracheal tube approximately 3.8 cm above the Watkins. Enteric tube courses in the the abdomen. 2. Near complete interval clearing of right infrahilar opacity. No new airspace disease identified. Electronically Signed   By: Rise Mu M.D.   On: 04/20/2016 00:40   Dg Chest Port 1 View  Result Date: 04/19/2016 CLINICAL DATA:  ETT, drug overdose, it OG tube placement EXAM: PORTABLE CHEST 1 VIEW COMPARISON:  12/16/2015 FINDINGS: Endotracheal tube terminates 4 cm above the Watkins. Enteric tube courses into the proximal stomach. Mild hazy right infrahilar opacity, suspicious for aspiration. Left lung is clear.  No pleural effusion or pneumothorax. The heart is normal in size. IMPRESSION: Endotracheal tube terminates 4 cm above the Watkins. Enteric tube courses into the proximal stomach. Mild hazy right infrahilar opacity, suspicious for aspiration. Electronically Signed   By: Charline Bills M.D.   On: 04/19/2016 18:06    STUDIES:  Head CT >> Normal  CULTURES: None  ANTIBIOTICS: None  SIGNIFICANT EVENTS: Intubated in ED  LINES/TUBES: ETT OGT PIV  DISCUSSION: 21 y/o p/w AMS and intoxication following apparent opiate O/D  ASSESSMENT / PLAN:  PULMONARY A: Need for mechanical ventilation Respiratory failure P:   Full Vent Support, SBT once awake  CARDIOVASCULAR A:  Troponinemia P:  Echo to evaluate for LV fucntion, ?stress cardiomyopathy  RENAL A:   Rhabdomyolysis Lactic acidosis P:   Hydrate aggressively with LR @ 300 h/r (now that LA has improved)  GASTROINTESTINAL A:   No active issues P:  HEMATOLOGIC A:   No active issues P:   INFECTIOUS A:   No active issues P:    ENDOCRINE A:   No active issues   P:    NEUROLOGIC A:   Opiate o/d P:   Appears to have escaped from hypoxic injury. Will hold sedation and allow to wake up  RASS goal: 0   FAMILY  - Updates:   - Inter-disciplinary family meet or Palliative Care meeting due by:  04/26/16  CRITICAL CARE Performed by: Jamie KatoRIMBLE, Bradden Tadros   Total critical care time: 85 minutes  Critical care time was exclusive of separately billable procedures and treating other patients.  Critical care was necessary to treat or prevent imminent or life-threatening deterioration.  Critical care was time spent personally by me on the following activities: development of treatment plan with patient and/or surrogate as well as nursing, discussions with consultants, evaluation of patient's response to treatment, examination of patient, obtaining history from patient or surrogate, ordering and performing treatments  and interventions, ordering and review of laboratory studies, ordering and review of radiographic studies, pulse oximetry and re-evaluation of patient's condition.   Jamie KatoAaron Makira Holleman, MD Pulmonary and Critical Care Medicine [facility] Pager: [phone]  04/20/2016, 5:03 AM

## 2016-04-21 ENCOUNTER — Inpatient Hospital Stay (HOSPITAL_COMMUNITY): Payer: 59

## 2016-04-21 DIAGNOSIS — T50904S Poisoning by unspecified drugs, medicaments and biological substances, undetermined, sequela: Secondary | ICD-10-CM

## 2016-04-21 DIAGNOSIS — T50901A Poisoning by unspecified drugs, medicaments and biological substances, accidental (unintentional), initial encounter: Secondary | ICD-10-CM

## 2016-04-21 DIAGNOSIS — G9340 Encephalopathy, unspecified: Secondary | ICD-10-CM

## 2016-04-21 DIAGNOSIS — R748 Abnormal levels of other serum enzymes: Secondary | ICD-10-CM

## 2016-04-21 DIAGNOSIS — R7889 Finding of other specified substances, not normally found in blood: Secondary | ICD-10-CM

## 2016-04-21 DIAGNOSIS — J9601 Acute respiratory failure with hypoxia: Secondary | ICD-10-CM

## 2016-04-21 LAB — CBC
HEMATOCRIT: 33.9 % — AB (ref 36.0–46.0)
HEMOGLOBIN: 11.2 g/dL — AB (ref 12.0–15.0)
MCH: 29.6 pg (ref 26.0–34.0)
MCHC: 33 g/dL (ref 30.0–36.0)
MCV: 89.7 fL (ref 78.0–100.0)
Platelets: 186 10*3/uL (ref 150–400)
RBC: 3.78 MIL/uL — AB (ref 3.87–5.11)
RDW: 12.9 % (ref 11.5–15.5)
WBC: 12.9 10*3/uL — ABNORMAL HIGH (ref 4.0–10.5)

## 2016-04-21 LAB — BLOOD GAS, ARTERIAL
ACID-BASE EXCESS: 0.8 mmol/L (ref 0.0–2.0)
Bicarbonate: 24.3 mmol/L (ref 20.0–28.0)
DRAWN BY: 24513
FIO2: 40
MECHVT: 450 mL
O2 SAT: 98.3 %
PATIENT TEMPERATURE: 98.6
PCO2 ART: 35 mmHg (ref 32.0–48.0)
PEEP/CPAP: 10 cmH2O
PH ART: 7.456 — AB (ref 7.350–7.450)
RATE: 22 resp/min
pO2, Arterial: 120 mmHg — ABNORMAL HIGH (ref 83.0–108.0)

## 2016-04-21 LAB — COMPREHENSIVE METABOLIC PANEL
ALK PHOS: 42 U/L (ref 38–126)
ALT: 68 U/L — ABNORMAL HIGH (ref 14–54)
ANION GAP: 6 (ref 5–15)
AST: 66 U/L — ABNORMAL HIGH (ref 15–41)
Albumin: 2.7 g/dL — ABNORMAL LOW (ref 3.5–5.0)
BILIRUBIN TOTAL: 1.5 mg/dL — AB (ref 0.3–1.2)
BUN: <5 mg/dL — ABNORMAL LOW (ref 6–20)
CALCIUM: 7.9 mg/dL — AB (ref 8.9–10.3)
CO2: 23 mmol/L (ref 22–32)
Chloride: 110 mmol/L (ref 101–111)
Creatinine, Ser: 0.57 mg/dL (ref 0.44–1.00)
GFR calc non Af Amer: >60 mL/min (ref >60)
Glucose, Bld: 88 mg/dL (ref 65–99)
POTASSIUM: 3 mmol/L — AB (ref 3.5–5.1)
SODIUM: 139 mmol/L (ref 135–145)
TOTAL PROTEIN: 4.7 g/dL — AB (ref 6.5–8.1)

## 2016-04-21 LAB — ECHOCARDIOGRAM COMPLETE
Height: 64 in
WEIGHTICAEL: 2141.11 [oz_av]

## 2016-04-21 LAB — CK: CK TOTAL: 1704 U/L — AB (ref 38–234)

## 2016-04-21 MED ORDER — CHLORHEXIDINE GLUCONATE CLOTH 2 % EX PADS
6.0000 | MEDICATED_PAD | Freq: Every day | CUTANEOUS | Status: DC
  Administered 2016-04-22: 6 via TOPICAL

## 2016-04-21 MED ORDER — SODIUM CHLORIDE 0.9 % IV SOLN
30.0000 meq | Freq: Once | INTRAVENOUS | Status: AC
  Administered 2016-04-21: 30 meq via INTRAVENOUS
  Filled 2016-04-21: qty 15

## 2016-04-21 MED ORDER — MUPIROCIN 2 % EX OINT
1.0000 "application " | TOPICAL_OINTMENT | Freq: Two times a day (BID) | CUTANEOUS | Status: AC
  Administered 2016-04-21 – 2016-04-25 (×10): 1 via NASAL
  Filled 2016-04-21: qty 22

## 2016-04-21 NOTE — Progress Notes (Signed)
  Echocardiogram 2D Echocardiogram has been performed.  Leta JunglingCooper, Stanislaw Acton M 04/21/2016, 10:47 AM

## 2016-04-21 NOTE — Progress Notes (Signed)
[facility] FOR AM LAB REPLACEMENT ONLY  The patient does [facility] based on the criteria listed below:   1. Is GFR >/= 40 ml/min? Yes.    Patient's GFR today is >60 2. Is urine output >/= 0.5 ml/kg/hr for the last 6 hours? Yes.   Patient's UOP is 1.3 ml/kg/hr 3. Is BUN < 60 mg/dL? Yes.    Patient's BUN today is <5 4. Abnormal electrolyte(s): K 3 5. Ordered repletion with: protocol 6. If a panic level lab has been reported, has the CCM MD in charge been notified? No..   Physician:    Markus DaftWHELAN, Jane Caskey A 04/21/2016 5:29 AM

## 2016-04-21 NOTE — Progress Notes (Signed)
Placed patient back on full support per NP, we'll try wean again in AM.

## 2016-04-21 NOTE — Progress Notes (Signed)
During AM shift, pt unable to be extubated and CCM note states to begin tube feeding in PM if unable to do so. Called CCM via eLink for orders and no orders received. Per MD, will wait for AM (04/22/2016) to make decision about tube feeding orders.  Francia GreavesSavannah R Bijon Mineer, RN

## 2016-04-21 NOTE — Progress Notes (Signed)
PULMONARY / CRITICAL CARE MEDICINE   Name: Jane Watkins MRN: [id number] DOB: [birth year]    ADMISSION DATE:  04/19/2016  CHIEF COMPLAINT:  Unresponsive  BRIEF SUMMARY:  21 y/o F with a history of opiate abuse who was found next to her boyfriend (who was deceased). She was unresponsive. She was brought to [facility] where she was found to combative and was intubated for airway protection, concern for aspiration. Her CK was 15k, and had a mild lactic acidosis. She was brought to [facility] for further evaluation.  SUBJECTIVE:  RN reports no acute events overnight.  Pt remains on propofol/fentanyl.  VITAL SIGNS: BP 98/67 (BP Location: Left Arm)   Pulse 94   Temp 98.7 F (37.1 C) (Axillary)   Resp (!) 22   Ht 5\' 4"  (1.626 m)   Wt 133 lb 13.1 oz (60.7 kg)   SpO2 100%   BMI 22.97 kg/m   HEMODYNAMICS:    VENTILATOR SETTINGS: Vent Mode: PRVC FiO2 (%):  [30 %-100 %] 40 % Set Rate:  [14 bmp-22 bmp] 22 bmp Vt Set:  [450 mL] 450 mL PEEP:  [5 cmH20-10 cmH20] 5 cmH20 Pressure Support:  [10 cmH20] 10 cmH20 Plateau Pressure:  [22 cmH20-24 cmH20] 24 cmH20  INTAKE / OUTPUT: I/O last 3 completed shifts: In: 8115.9 [I.V.:6250.9; IV Piggyback:1865] Out: 4285 [Urine:3950; Emesis/NG output:335]  PHYSICAL EXAMINATION: General: young adult female in NAD HEENT: MM pink/moist, ETT Neuro: sedation turned off with provider in room, awakens & looks around but does not make eye contact, moves all extremities, localizes to pain, +cough, ? If she stuck her tongue out on command > became tachypneic and WUA aborted CV: s1s2 rrr, no m/r/g PULM: even/non-labored, lungs bilaterally coarse WU:JWJX, non-tender, bsx4 active  Extremities: warm/dry, no edema  Skin: no rashes or lesions  LABS:  BMET  Recent Labs Lab 04/19/16 1742 04/19/16 2324 04/21/16 0235  NA 141 138 139  K 4.4 3.6 3.0*  CL 113* 112* 110  CO2 21* 23 23  BUN 24* 16 <5*  CREATININE 0.74 0.78 0.57  GLUCOSE 118* 94 88    Electrolytes  Recent Labs Lab 04/19/16 1742 04/19/16 2324 04/21/16 0235  CALCIUM 8.7* 8.2* 7.9*   CBC  Recent Labs Lab 04/19/16 1742 04/19/16 2324 04/21/16 0235  WBC 21.8* 17.7* 12.9*  HGB 13.9 12.9 11.2*  HCT 40.7 39.0 33.9*  PLT 261 220 186   Coag's No results for input(s): APTT, INR in the last 168 hours.  Sepsis Markers  Recent Labs Lab 04/19/16 1742 04/19/16 2325 04/20/16 0450  LATICACIDVEN 3.4* 1.0 0.8   ABG  Recent Labs Lab 04/19/16 2120 04/20/16 1750 04/21/16 0340  PHART 7.353 7.438 7.456*  PCO2ART 44.2 34.8 35.0  PO2ART 194.0* 191* 120*   Liver Enzymes  Recent Labs Lab 04/19/16 1742 04/21/16 0235  AST 174* 66*  ALT 142* 68*  ALKPHOS 47 42  BILITOT 1.3* 1.5*  ALBUMIN 3.8 2.7*   Cardiac Enzymes  Recent Labs Lab 04/19/16 2324 04/20/16 0450 04/20/16 1149  TROPONINI 2.75* 2.94* 2.34*   Glucose  Recent Labs Lab 04/19/16 2024  GLUCAP 84   Imaging Dg Chest Port 1 View  Result Date: 04/21/2016 CLINICAL DATA:  Respiratory failure. EXAM: PORTABLE CHEST 1 VIEW COMPARISON:  04/20/2016. FINDINGS: Endotracheal tube and NG tube in stable position. Right base infiltrate noted consistent pneumonia/ aspiration. Slight interim improvement from prior exam . No pleural effusion or pneumothorax. IMPRESSION: 1. Lines and tubes in stable position. 2. Right base infiltrate noted consistent  with pneumonia/aspiration. Slight interim improvement from prior exam . Electronically Signed   By: Maisie Fushomas  Register   On: 04/21/2016 07:45   Dg Chest Port 1 View  Result Date: 04/20/2016 CLINICAL DATA:  Respiratory distress EXAM: PORTABLE CHEST 1 VIEW COMPARISON:  04/19/2016 FINDINGS: Cardiac shadow is within normal limits. The endotracheal tube and nasogastric catheter are in satisfactory position. The lungs are well aerated bilaterally. New bibasilar changes are noted which would correspond with the patient's given clinical history of aspiration. No sizable  effusion is seen. No bony abnormality is noted. IMPRESSION: Increasing bibasilar changes which would correspond with the given clinical history of aspiration. Electronically Signed   By: Alcide CleverMark  Lukens M.D.   On: 04/20/2016 15:19    STUDIES:  Head CT 3/10 >> Normal ECHO 3/11 >>  CULTURES: Sputum 3/11 >>  BCx2 3/11 >>   ANTIBIOTICS: Unasyn 3/11 >>  Vancomycin 3/11 >>   SIGNIFICANT EVENTS: 3/10  Admit after overdose  LINES/TUBES: ETT 3/10 >>   DISCUSSION: 21 y/o p/w AMS and intoxication following apparent opiate O/D  ASSESSMENT / PLAN:  PULMONARY A: Need for mechanical ventilation Acute Respiratory failure RLL Aspiration PNA - mild airspace disease Tobacco Abuse  P:   PRVC 8 cc/kg  Wean PEEP / FiO2 for sats > 92% Intermittent CXR  Avoid hypoxemia  PRN Bronchodilators   CARDIOVASCULAR A:  Troponinemia P:  Await ECHO findings ICU monitoring  Trend troponin   RENAL A:   Rhabdomyolysis Lactic acidosis Hypokalemia  P:   LR at 50 ml/hr Trend BMP / urinary output Replace electrolytes as indicated Avoid nephrotoxic agents, ensure adequate renal perfusion  GASTROINTESTINAL A:   No active issues P:   Begin TF this PM if not extubated  NPO  PPI for SUP   HEMATOLOGIC A:   No active issues P:  Lovenox for DVT prophylaxis   INFECTIOUS A:   Suspected Aspiration - minimal airspace disease on CXR P:   Unasyn / Vacno, D2/x  Follow cultures   ENDOCRINE A:   No active issues   P:   Monitor glucose on BMP   NEUROLOGIC A:   Opiate Overdose  Acute Encephalopathy  Rule Out Anoxic Injury P:   RASS Goal: 0 to -1 Fentanyl gtt for pain  Propofol for sedation  Minimize sedating agents as able  Repeat WUA ~ 2pm, if not improved, will send for MRI brain    FAMILY  - Updates: Mother / Father updated extensively at bedside.   - Inter-disciplinary family meet or Palliative Care meeting due by:  04/26/16   NP CC Time: 40 minutes  Canary BrimBrandi Ollis,  NP-C  Pulmonary & Critical Care Pgr: [phone] or if no answer [phone] 04/21/2016, 9:07 AM  Attending Note:  21 year old female with narcotic overdose where she was found unresponsive next to her boyfriend who was deceased.  [facility] and was intubated for airway protection.  Noted to have a small aspiration pneumonia on CXR that I reviewed myself with ETT in good position.  On exam, lungs with coarse BS diffusely and patient localizes, withdraws to pain and follows commands.  Will continue sedation for now, hold in the afternoon, if mental status not improved then will order an MRI and consider neuro consult.  Keep intubated for now and continue abx and will recheck in AM.  The patient is critically ill with multiple organ systems failure and requires high complexity decision making for assessment and support, frequent evaluation and  titration of therapies, application of advanced monitoring technologies and extensive interpretation of multiple databases.   Critical Care Time devoted to patient care services described in this note is  35  Minutes. This time reflects time of care of this signee Dr Jennet Maduro. This critical care time does not reflect procedure time, or teaching time or supervisory time of PA/NP/Med student/Med Resident etc but could involve care discussion time.  Rush Farmer, M.D. [facility] Pulmonary/Critical Care Medicine. Pager: [phone]. After hours pager: [phone].

## 2016-04-22 ENCOUNTER — Inpatient Hospital Stay (HOSPITAL_COMMUNITY): Payer: 59

## 2016-04-22 DIAGNOSIS — T40601D Poisoning by unspecified narcotics, accidental (unintentional), subsequent encounter: Secondary | ICD-10-CM

## 2016-04-22 DIAGNOSIS — T50901D Poisoning by unspecified drugs, medicaments and biological substances, accidental (unintentional), subsequent encounter: Secondary | ICD-10-CM

## 2016-04-22 DIAGNOSIS — R7989 Other specified abnormal findings of blood chemistry: Secondary | ICD-10-CM

## 2016-04-22 DIAGNOSIS — R778 Other specified abnormalities of plasma proteins: Secondary | ICD-10-CM

## 2016-04-22 LAB — URINE CULTURE: Culture: >=100000 — AB

## 2016-04-22 LAB — CK: Total CK: 1147 U/L — ABNORMAL HIGH (ref 38–234)

## 2016-04-22 LAB — COMPREHENSIVE METABOLIC PANEL
ALT: 60 U/L — ABNORMAL HIGH (ref 14–54)
ANION GAP: 8 (ref 5–15)
AST: 55 U/L — ABNORMAL HIGH (ref 15–41)
Albumin: 3 g/dL — ABNORMAL LOW (ref 3.5–5.0)
Alkaline Phosphatase: 42 U/L (ref 38–126)
BUN: 5 mg/dL — ABNORMAL LOW (ref 6–20)
CHLORIDE: 111 mmol/L (ref 101–111)
CO2: 23 mmol/L (ref 22–32)
Calcium: 8.2 mg/dL — ABNORMAL LOW (ref 8.9–10.3)
Creatinine, Ser: 0.66 mg/dL (ref 0.44–1.00)
Glucose, Bld: 87 mg/dL (ref 65–99)
POTASSIUM: 3.5 mmol/L (ref 3.5–5.1)
SODIUM: 142 mmol/L (ref 135–145)
Total Bilirubin: 1.5 mg/dL — ABNORMAL HIGH (ref 0.3–1.2)
Total Protein: 5.6 g/dL — ABNORMAL LOW (ref 6.5–8.1)

## 2016-04-22 LAB — CBC
HCT: 34 % — ABNORMAL LOW (ref 36.0–46.0)
HEMOGLOBIN: 11.3 g/dL — AB (ref 12.0–15.0)
MCH: 29.9 pg (ref 26.0–34.0)
MCHC: 33.2 g/dL (ref 30.0–36.0)
MCV: 89.9 fL (ref 78.0–100.0)
PLATELETS: 203 10*3/uL (ref 150–400)
RBC: 3.78 MIL/uL — AB (ref 3.87–5.11)
RDW: 13.1 % (ref 11.5–15.5)
WBC: 12.5 10*3/uL — AB (ref 4.0–10.5)

## 2016-04-22 LAB — VANCOMYCIN, TROUGH: Vancomycin Tr: 8 ug/mL — ABNORMAL LOW (ref 15–20)

## 2016-04-22 LAB — TROPONIN I: TROPONIN I: 0.51 ng/mL — AB (ref <0.03)

## 2016-04-22 MED ORDER — SODIUM CHLORIDE 0.9% FLUSH: 10.0000 mL | INTRAVENOUS | Status: DC | PRN

## 2016-04-22 MED ORDER — MAGNESIUM SULFATE 2 GM/50ML IV SOLN
2.0000 g | Freq: Once | INTRAVENOUS | Status: AC
  Administered 2016-04-22: 2 g via INTRAVENOUS
  Filled 2016-04-22: qty 50

## 2016-04-22 MED ORDER — CHLORHEXIDINE GLUCONATE CLOTH 2 % EX PADS
6.0000 | MEDICATED_PAD | Freq: Every day | CUTANEOUS | Status: DC
  Administered 2016-04-23 – 2016-04-26 (×5): 6 via TOPICAL

## 2016-04-22 MED ORDER — VECURONIUM BROMIDE 10 MG IV SOLR
5.0000 mg | Freq: Once | INTRAVENOUS | Status: AC
  Administered 2016-04-22: 5 mg via INTRAVENOUS

## 2016-04-22 MED ORDER — VANCOMYCIN HCL 10 G IV SOLR
1250.0000 mg | Freq: Three times a day (TID) | INTRAVENOUS | Status: DC
  Filled 2016-04-22 (×2): qty 1250

## 2016-04-22 MED ORDER — VANCOMYCIN HCL 10 G IV SOLR
1250.0000 mg | Freq: Three times a day (TID) | INTRAVENOUS | Status: DC
  Administered 2016-04-22 – 2016-04-24 (×6): 1250 mg via INTRAVENOUS
  Filled 2016-04-22 (×6): qty 1250

## 2016-04-22 MED ORDER — SODIUM CHLORIDE 0.9 % IV SOLN
30.0000 meq | Freq: Once | INTRAVENOUS | Status: AC
  Administered 2016-04-22: 30 meq via INTRAVENOUS
  Filled 2016-04-22: qty 15

## 2016-04-22 MED ORDER — SODIUM CHLORIDE 0.9% FLUSH
10.0000 mL | Freq: Two times a day (BID) | INTRAVENOUS | Status: DC
  Administered 2016-04-22: 20 mL
  Administered 2016-04-23 (×2): 10 mL
  Administered 2016-04-24: 30 mL
  Administered 2016-04-25 – 2016-04-26 (×2): 10 mL
  Administered 2016-04-27: 20 mL
  Administered 2016-04-27 – 2016-05-01 (×8): 10 mL

## 2016-04-22 MED ORDER — VECURONIUM BROMIDE 10 MG IV SOLR: 5.0000 mg | Freq: Once | INTRAVENOUS | Status: DC

## 2016-04-22 MED ORDER — VECURONIUM BROMIDE 10 MG IV SOLR
INTRAVENOUS | Status: AC
  Filled 2016-04-22: qty 10

## 2016-04-22 NOTE — Consult Note (Signed)
Neurology Consultation Reason for Consult: obtunded Referring Physician: ICU  CC: opioid overdose  History is obtained from parents  HPI: Jane Watkins is a 21 y.o. female who was found down prior to admission and since it has been determined that she had an opioid overdose.  She is vented and the team has not been able to extubate as she keeps fighting the vent and reaching for the ET tube.  I am called to help with prognostication.  MRI was just completed  ROS: Unable to obtain due to altered mental status.   History reviewed. No pertinent past medical history.  Family History  Problem Relation Age of Onset  . Anemia Father     Social History: per chart  reports that she has been smoking Cigarettes.  She has a 4.00 pack-year smoking history. She has never used smokeless tobacco. She reports that she does not drink alcohol or use drugs.  Exam: Current vital signs: BP (!) 114/56   Pulse 83   Temp 97.7 F (36.5 C) (Axillary)   Resp (!) 22   Ht 5\' 4"  (1.626 m)   Wt 60.7 kg (133 lb 13.1 oz)   SpO2 100%   BMI 22.97 kg/m  Vital signs in last 24 hours: Temp:  [97.7 F (36.5 C)-99.6 F (37.6 C)] 97.7 F (36.5 C) (03/13 0400) Pulse Rate:  [52-115] 83 (03/13 1120) Resp:  [13-24] 22 (03/13 1120) BP: (92-125)/(38-87) 114/56 (03/13 1100) SpO2:  [88 %-100 %] 100 % (03/13 1120) FiO2 (%):  [30 %] 30 % (03/13 1120) Weight:  [60.7 kg (133 lb 13.1 oz)] 60.7 kg (133 lb 13.1 oz) (03/13 0545)   Physical Exam  Constitutional: Appears well-developed and well-nourished.  Psych: Affect appropriate to situation Eyes: No scleral injection HENT: No OP obstrucion Head: Normocephalic.  Cardiovascular: Normal rate and regular rhythm.  Respiratory: Effort normal and breath sounds normal to anterior ascultation GI: Soft.  No distension. There is no tenderness.  Skin: WDI  Neuro: Mental Status: Intubated and sedated with both propofol and fentanyl Cranial Nerves: II: unable to  assess III,IV, VI: EOMI  Dysconjugate gaze (sedated) V: Facial sensation is symmetric to temperature VII: Facial movement is symmetric.  VIII: hearing is intact to voice X: Uvula elevates symmetrically XI: Shoulder shrug is symmetric. XII: tongue is midline without atrophy or fasciculations.  Motor: Tone is normal. Bulk is normal. Moves all extremities equally except for the right leg which is extended and tends to shake to some extent. Sensory: Sensation - unable to test Deep Tendon Reflexes: Unable to obtain Plantars: Equivocal - no clonus Cerebellar: Unable to test   I have reviewed the images obtained:  There is no obvious abnormality in the MRI - the high intensity in the cortex on the DWI has no adc correlate.    Impression:  Opioid overdose with possible ischemic vs hypoxic brain injury, although MRI does not appear to demonstrate clear pathology.  I am going to hook pt up to continuous monitor.  Consider decreasing propofol and fentanyl and starting precedex instead.  Will continue to follow closely

## 2016-04-22 NOTE — Progress Notes (Signed)
Peripherally Inserted Central Catheter/Midline Placement  The IV Nurse has discussed with the patient and/or persons authorized to consent for the patient, the purpose of this procedure and the potential benefits and risks involved with this procedure.  The benefits include less needle sticks, lab draws from the catheter, and the patient may be discharged home with the catheter. Risks include, but not limited to, infection, bleeding, blood clot (thrombus formation), and puncture of an artery; nerve damage and irregular heartbeat and possibility to perform a PICC exchange if needed/ordered by physician.  Alternatives to this procedure were also discussed.  Bard Power PICC patient education guide, fact sheet on infection prevention and patient information card has been provided to patient /or left at bedside.    PICC/Midline Placement Documentation        Romie Jumperlford, Cheskel Silverio Terry 04/22/2016, 5:54 PM

## 2016-04-22 NOTE — Progress Notes (Signed)
LTM Day 1 started; all impedances under 5 kohms; patient agitated, so glued and wrapped her head. Patient event button tested.

## 2016-04-22 NOTE — Progress Notes (Signed)
Pharmacy Antibiotic Note Jane Watkins is a 21 y.o. female admitted on 04/19/2016 that is currently on day 3 of Unasyn and vancomycin for treatment of aspiration and MRSA PNA.   Vancomycin trough of 8 this evening is below desired range of 15-20 based on indication. Renal function is stable.   Plan: 1. Adjust vancomycin to 1250 mg IV every 8 hours  2. Continue Unasyn 3 grams IV every 8 hours    Height: 5\' 4"  (162.6 cm) Weight: 133 lb 13.1 oz (60.7 kg) IBW/kg (Calculated) : 54.7  Temp (24hrs), Avg:98.7 F (37.1 C), Min:97.7 F (36.5 C), Max:99.6 F (37.6 C)   Recent Labs Lab 04/19/16 1742 04/19/16 2324 04/19/16 2325 04/20/16 0450 04/21/16 0235 04/22/16 0334 04/22/16 1505  WBC 21.8* 17.7*  --   --  12.9* 12.5*  --   CREATININE 0.74 0.78  --   --  0.57 0.66  --   LATICACIDVEN 3.4*  --  1.0 0.8  --   --   --   VANCOTROUGH  --   --   --   --   --   --  8*    Estimated Creatinine Clearance: 96.9 mL/min (by C-G formula based on SCr of 0.66 mg/dL).    Allergies  Allergen Reactions  . Other Rash    Tide detergent   . Sulfa Antibiotics Rash    Antimicrobials this admission: Zosyn 3/10 x1  Unasyn 3/11>>  Vancomycin 3/11>>  Microbiology results: 3/11 blood cx: ngtd 3/11 resp cx: few MRSA 3/10 UCx: CoNS 3/10 MRSA PCR: pos  Thank you for allowing pharmacy to be a part of this patient's care.  Pollyann SamplesAndy [name], PharmD, BCPS 04/22/2016, 4:24 PM

## 2016-04-22 NOTE — Progress Notes (Signed)
PULMONARY / CRITICAL CARE MEDICINE   Name: Jane Watkins MRN: [id number] DOB: 06/14/1995    ADMISSION DATE:  04/19/2016  CHIEF COMPLAINT:  Unresponsive  BRIEF SUMMARY:  21 y/o F with a history of opiate abuse who was found next to her boyfriend (who was deceased). She was unresponsive. [facility] was [facility] where she was found to combative and was intubated for airway protection, concern for aspiration. Her CK was 15k, and had a mild lactic acidosis. She was [facility] to [facility].  SUBJECTIVE:  RN reports sedation off at 0700.  Pt moving all ext's but no follow commands.  There was question she was sticking tongue out yesterday to command but none today.  No acute events overnight  VITAL SIGNS: BP 101/87 (BP Location: Right Arm)   Pulse (!) 104   Temp 97.7 F (36.5 C) (Axillary)   Resp 17   Ht 5\' 4"  (1.626 m)   Wt 133 lb 13.1 oz (60.7 kg)   SpO2 92%   BMI 22.97 kg/m   HEMODYNAMICS:    VENTILATOR SETTINGS: Vent Mode: PRVC FiO2 (%):  [30 %-40 %] 30 % Set Rate:  [22 bmp] 22 bmp Vt Set:  [450 mL] 450 mL PEEP:  [5 cmH20] 5 cmH20 Pressure Support:  [5 cmH20] 5 cmH20 Plateau Pressure:  [15 cmH20-20 cmH20] 19 cmH20  INTAKE / OUTPUT: I/O last 3 completed shifts: In: 4046.4 [I.V.:2516.4; IV Piggyback:1530] Out: 3735 [Urine:3025; Emesis/NG output:710]  PHYSICAL EXAMINATION: General: young adult female on vent HEENT: MM pink/moist, ETT Neuro: pupils 4mm reactive, +gag/cough, localizes to pain, weaning on PSV, moves all ext's but does not follow commands CV: s1s2 rrr, no m/r/g PULM: even/non-labored, lungs bilaterally  WU:JWJXGI:soft, non-tender, bsx4 active  Extremities: warm/dry, trace generalized edema  Skin: no rashes or lesions   LABS:  BMET  Recent Labs Lab 04/19/16 2324 04/21/16 0235 04/22/16 0334  NA 138 139 142  K 3.6 3.0* 3.5  CL 112* 110 111  CO2 23 23 23   BUN 16 <5* 5*  CREATININE 0.78 0.57 0.66  GLUCOSE 94 88 87    Electrolytes  Recent Labs Lab 04/19/16 2324 04/21/16 0235 04/22/16 0334  CALCIUM 8.2* 7.9* 8.2*   CBC  Recent Labs Lab 04/19/16 2324 04/21/16 0235 04/22/16 0334  WBC 17.7* 12.9* 12.5*  HGB 12.9 11.2* 11.3*  HCT 39.0 33.9* 34.0*  PLT 220 186 203   Coag's No results for input(s): APTT, INR in the last 168 hours.  Sepsis Markers  Recent Labs Lab 04/19/16 1742 04/19/16 2325 04/20/16 0450  LATICACIDVEN 3.4* 1.0 0.8   ABG  Recent Labs Lab 04/19/16 2120 04/20/16 1750 04/21/16 0340  PHART 7.353 7.438 7.456*  PCO2ART 44.2 34.8 35.0  PO2ART 194.0* 191* 120*   Liver Enzymes  Recent Labs Lab 04/19/16 1742 04/21/16 0235 04/22/16 0334  AST 174* 66* 55*  ALT 142* 68* 60*  ALKPHOS 47 42 42  BILITOT 1.3* 1.5* 1.5*  ALBUMIN 3.8 2.7* 3.0*   Cardiac Enzymes  Recent Labs Lab 04/19/16 2324 04/20/16 0450 04/20/16 1149  TROPONINI 2.75* 2.94* 2.34*   Glucose  Recent Labs Lab 04/19/16 2024  GLUCAP 84   Imaging Dg Chest Port 1 View  Result Date: 04/22/2016 CLINICAL DATA:  Drug overdose. EXAM: PORTABLE CHEST 1 VIEW COMPARISON:  04/21/2016 . FINDINGS: Endotracheal tube and NG tube in stable position. Heart size stable. Bilateral pulmonary infiltrates particularly on the right again noted. Findings consistent with bilateral pulmonary edema and/or pneumonia. Aspiration cannot  be excluded. Small right pleural effusion cannot be excluded. No pneumothorax . IMPRESSION: 1. Lines and tubes in stable position. 2. Bilateral pulmonary infiltrates particularly on the right again noted. Findings consistent with pulmonary edema and/or pneumonia. Aspiration cannot be excluded. Electronically Signed   By: Maisie Fus  Register   On: 04/22/2016 07:22    STUDIES:  Head CT 3/10 >> Normal ECHO 3/11 >> LVEF 65-70%, no RWMA, no evidence of vegetation  CULTURES: Sputum 3/11 >>  BCx2 3/11 >>  UC 3/10 >> greater 100k coag neg staph>>   ANTIBIOTICS: Unasyn 3/11 >>  Vancomycin 3/11  >>   SIGNIFICANT EVENTS: 3/10  Admit after overdose  LINES/TUBES: ETT 3/10 >>   DISCUSSION: 21 y/o p/w AMS and intoxication following apparent opiate O/D.  Boyfriend was deceased at her side.   ASSESSMENT / PLAN:  PULMONARY A: Need for mechanical ventilation Acute Respiratory failure RLL Aspiration PNA - mild airspace disease Tobacco Abuse  P:   PRVC 8 cc/kg  Wean PEEP / FiO2 for sats > 92% Intermittent CXR Avoid hypoxemia  PRN bronchodilators PSV as tolerated   CARDIOVASCULAR A:  Troponinemia - likely demand ischemia P:  ECHO as above ICU monitoring  Repeat troponin x1   RENAL A:   Rhabdomyolysis Lactic acidosis Hypokalemia  P:   LR at 50 ml/hr  Trend BMP / UOP  Replace electrolytes as indicated, K & Mg 3/13  Trend CK   GASTROINTESTINAL A:   No active issues P:   NPO / OGT  PPI for SUP Begin TF after MRI  HEMATOLOGIC A:   No active issues P:  Lovenox for DVT prophylaxis   INFECTIOUS A:   Suspected Aspiration - minimal airspace disease on CXR Coag Neg Staph UTI P:   Trend PCT  Follow cutlures Continue Unasyn / Vanco D3/x  Likely can d/c abx after 3/13 dosing  ENDOCRINE A:   No active issues   P:   Trend glucose on BMP  NEUROLOGIC A:   Opiate Overdose  Acute Encephalopathy  Rule Out Anoxic Injury P:   RASS Goal: 0 to -1  Fentanyl gtt for pain  Propofol for sedation  Minimize sedating medications as able  MRI Brain now  Neurology consulted, appreciate input   FAMILY  - Updates: Father and Step-Mother updated at bedside  - Inter-disciplinary family meet or Palliative Care meeting due by:  04/26/16   NP CC Time: 30 minutes   Canary Brim, NP-C [place] [facility] Pgr: [phone] or if no answer [phone] 04/22/2016, 8:57 AM  Attending Note:  21 year old female s/p heroin overdose that is unresponsive and remains unresponsive on exam.  I reviewed CXR myself, ETT ok.  Lungs clear on exam.  I am concerned about  the fact that she is not waking up.  Consulted neurology who recommended an MRI and will see patient.  Concern for watershed infarcts.  Will not extubate today.  Wean as able.  Need to determine neuro status prior to extubation.  The patient is critically ill with multiple organ systems failure and requires high complexity decision making for assessment and support, frequent evaluation and titration of therapies, application of advanced monitoring technologies and extensive interpretation of multiple databases.   Critical Care Time devoted to patient care services described in this note is  35  Minutes. This time reflects time of care of this signee Dr Koren Bound. This critical care time does not reflect procedure time, or teaching time or supervisory time of  PA/NP/Med student/Med Resident etc but could involve care discussion time.  Rush Farmer, M.D. [facility] Pulmonary/Critical Care Medicine. Pager: [phone]. After hours pager: [phone].

## 2016-04-22 NOTE — Progress Notes (Signed)
At 0330, noticed a rapid tremor of pt's RLE - occurred intermittently (witnessed 3 separate instances) and would subside after 10-20 seconds. Visitor in room noticed this had been occurring intermittently about 5 minutes prior to being witnessed by RN.   Notified CCM via eLink to inform of current pt situation. MD informed RN that PRN order for ativan would be placed.  Will continue to monitor closely.  Francia GreavesSavannah R Katrin Grabel, RN

## 2016-04-22 NOTE — Care Management Note (Signed)
Case Management Note  Patient Details  Name: Jane Watkins MRN: [id number] Date of Birth: 12/05/1995  Subjective/Objective: Pt admitted on 04/19/16 with an apparent opiate OD.  PTA, pt independent of ADLS.                     Action/Plan: Pt currently remains intubated; not responding currently.  Will follow progress.    Expected Discharge Date:                  Expected Discharge Plan:     In-House Referral:  Clinical Social Work  Discharge planning Services  CM Consult  Post Acute Care Choice:    Choice offered to:     DME Arranged:    DME Agency:     HH Arranged:    HH Agency:     Status of Service:  In process, will continue to follow  If discussed at Long Length of Stay Meetings, dates discussed:    Additional Comments:  Glennon Macmerson, Jaymison Luber M, RN 04/22/2016, 4:50 PM

## 2016-04-22 NOTE — Progress Notes (Signed)
[facility] REPLACEMENT ONLY  The [facility] based on the criteria listed below:   1. Is GFR >/= 40 ml/min? Yes.    Patient's GFR today is >60 2. Is urine output >/= 0.5 ml/kg/hr for the last 6 hours? Yes.   Patient's UOP is 0.86 ml/kg/hr 3. Is BUN < 60 mg/dL? Yes.    Patient's BUN today is 5 4. Abnormal electrolyte(s): Potassium 3.5 5. Ordered repletion with: Potassium per protocol 6. If a panic level lab has been reported, has the CCM MD in charge been notified? No..   Physician:    Thomasenia BottomsLANTZY, Ethridge Sollenberger P 04/22/2016 6:13 AM

## 2016-04-22 NOTE — Progress Notes (Signed)
Initial Nutrition Assessment  INTERVENTION:   Increase Vital High Protein to 55 ml/hr (1200 ml/day) MVI daily  Provides: 1200 kcal, 105 grams protein, and 1003 ml free water TF regimen and propofol at current rate providing 1672 total kcal/day (98 % of kcal needs)  NUTRITION DIAGNOSIS:   Inadequate oral intake related to inability to eat as evidenced by NPO status.  GOAL:   Patient will meet greater than or equal to 90% of their needs  MONITOR:   TF tolerance, Vent status, I & O's  REASON FOR ASSESSMENT:   Consult Enteral/tube feeding initiation and management  ASSESSMENT:   21 y/o p/w AMS and intoxication following apparent opiate O/D.  Boyfriend was deceased at her side.  Patient is currently intubated on ventilator support MV: 9.4 L/min Temp (24hrs), Avg:99 F (37.2 C), Min:98.1 F (36.7 C), Max:100.3 F (37.9 C)  Propofol: 17.9 provides: 472 kcal  PO4 and magnesium are WNL Adult TF protocol started 3/14, Vital High Protein @ 40 ml/hr with 30 ml Prostat BID providing 1160 kcal, 114 grams protein  Diet Order:    NPO  Skin:  Reviewed, no issues  Last BM:  unknown  Height:   Ht Readings from Last 1 Encounters:  04/19/16 5\' 4"  (1.626 m)    Weight:   Wt Readings from Last 1 Encounters:  04/23/16 131 lb 6.3 oz (59.6 kg)    Ideal Body Weight:  54.5 kg  BMI:  Body mass index is 22.55 kg/m.  Estimated Nutritional Needs:   Kcal:  1767  Protein:  75-90 grams  Fluid:  > 1.7 L/day  EDUCATION NEEDS:   No education needs identified at this time  Kendell BaneHeather Johniece Hornbaker RD, LDN, CNSC [phone] Pager [phone] After Hours Pager

## 2016-04-23 ENCOUNTER — Inpatient Hospital Stay (HOSPITAL_COMMUNITY): Payer: 59

## 2016-04-23 DIAGNOSIS — F1110 Opioid abuse, uncomplicated: Secondary | ICD-10-CM

## 2016-04-23 DIAGNOSIS — T50901A Poisoning by unspecified drugs, medicaments and biological substances, accidental (unintentional), initial encounter: Secondary | ICD-10-CM

## 2016-04-23 LAB — POCT I-STAT 3, ART BLOOD GAS (G3+)
Acid-base deficit: 7 mmol/L — ABNORMAL HIGH (ref 0.0–2.0)
Bicarbonate: 18.8 mmol/L — ABNORMAL LOW (ref 20.0–28.0)
O2 SAT: 99 %
PCO2 ART: 38.3 mmHg (ref 32.0–48.0)
Patient temperature: 98.8
TCO2: 20 mmol/L (ref 0–100)
pH, Arterial: 7.299 — ABNORMAL LOW (ref 7.350–7.450)
pO2, Arterial: 136 mmHg — ABNORMAL HIGH (ref 83.0–108.0)

## 2016-04-23 LAB — CULTURE, RESPIRATORY W GRAM STAIN

## 2016-04-23 LAB — BASIC METABOLIC PANEL
Anion gap: 11 (ref 5–15)
CALCIUM: 8 mg/dL — AB (ref 8.9–10.3)
CO2: 19 mmol/L — ABNORMAL LOW (ref 22–32)
CREATININE: 0.51 mg/dL (ref 0.44–1.00)
Chloride: 112 mmol/L — ABNORMAL HIGH (ref 101–111)
GFR calc Af Amer: >60 mL/min (ref >60)
GLUCOSE: 74 mg/dL (ref 65–99)
Potassium: 3.5 mmol/L (ref 3.5–5.1)
SODIUM: 142 mmol/L (ref 135–145)

## 2016-04-23 LAB — CBC
HCT: 30.5 % — ABNORMAL LOW (ref 36.0–46.0)
HEMOGLOBIN: 10 g/dL — AB (ref 12.0–15.0)
MCH: 29.5 pg (ref 26.0–34.0)
MCHC: 32.8 g/dL (ref 30.0–36.0)
MCV: 90 fL (ref 78.0–100.0)
PLATELETS: 179 10*3/uL (ref 150–400)
RBC: 3.39 MIL/uL — AB (ref 3.87–5.11)
RDW: 13.2 % (ref 11.5–15.5)
WBC: 7.6 10*3/uL (ref 4.0–10.5)

## 2016-04-23 LAB — MAGNESIUM
MAGNESIUM: 1.9 mg/dL (ref 1.7–2.4)
MAGNESIUM: 1.9 mg/dL (ref 1.7–2.4)
MAGNESIUM: 1.7 mg/dL (ref 1.7–2.4)

## 2016-04-23 LAB — GLUCOSE, CAPILLARY
Glucose-Capillary: 69 mg/dL (ref 65–99)
Glucose-Capillary: 132 mg/dL — ABNORMAL HIGH (ref 65–99)

## 2016-04-23 LAB — CK: CK TOTAL: 439 U/L — AB (ref 38–234)

## 2016-04-23 LAB — PHOSPHORUS
Phosphorus: 4.1 mg/dL (ref 2.5–4.6)
Phosphorus: 4.6 mg/dL (ref 2.5–4.6)

## 2016-04-23 LAB — TRIGLYCERIDES: TRIGLYCERIDES: 145 mg/dL (ref <150)

## 2016-04-23 LAB — CULTURE, RESPIRATORY

## 2016-04-23 MED ORDER — VITAL HIGH PROTEIN PO LIQD
1000.0000 mL | ORAL | Status: DC
  Administered 2016-04-23: 1000 mL
  Administered 2016-04-24 (×2)

## 2016-04-23 MED ORDER — FOLIC ACID 1 MG PO TABS
1.0000 mg | ORAL_TABLET | Freq: Every day | ORAL | Status: DC
  Administered 2016-04-23 – 2016-04-27 (×5): 1 mg via ORAL
  Filled 2016-04-23 (×7): qty 1

## 2016-04-23 MED ORDER — ROCURONIUM BROMIDE 50 MG/5ML IV SOLN
50.0000 mg | Freq: Once | INTRAVENOUS | Status: AC
  Administered 2016-04-23: 50 mg via INTRAVENOUS

## 2016-04-23 MED ORDER — PRO-STAT SUGAR FREE PO LIQD
30.0000 mL | Freq: Two times a day (BID) | ORAL | Status: DC
  Administered 2016-04-23 – 2016-04-24 (×3): 30 mL
  Filled 2016-04-23 (×3): qty 30

## 2016-04-23 MED ORDER — ADULT MULTIVITAMIN W/MINERALS CH
1.0000 | ORAL_TABLET | Freq: Every day | ORAL | Status: DC
  Administered 2016-04-23 – 2016-05-01 (×6): 1 via ORAL
  Filled 2016-04-23 (×7): qty 1

## 2016-04-23 MED ORDER — PNEUMOCOCCAL VAC POLYVALENT 25 MCG/0.5ML IJ INJ
0.5000 mL | INJECTION | INTRAMUSCULAR | Status: AC
  Administered 2016-04-24: 0.5 mL via INTRAMUSCULAR
  Filled 2016-04-23: qty 0.5

## 2016-04-23 MED ORDER — DEXTROSE 50 % IV SOLN
25.0000 mL | Freq: Once | INTRAVENOUS | Status: AC
  Administered 2016-04-23: 25 mL via INTRAVENOUS

## 2016-04-23 MED ORDER — DEXMEDETOMIDINE HCL IN NACL 200 MCG/50ML IV SOLN
0.2000 ug/kg/h | INTRAVENOUS | Status: AC
  Administered 2016-04-23: 0.2 ug/kg/h via INTRAVENOUS
  Administered 2016-04-23 – 2016-04-24 (×3): 0.9 ug/kg/h via INTRAVENOUS
  Filled 2016-04-23 (×4): qty 50

## 2016-04-23 MED ORDER — VITAMIN B-1 100 MG PO TABS
100.0000 mg | ORAL_TABLET | Freq: Every day | ORAL | Status: DC
  Administered 2016-04-23 – 2016-05-01 (×6): 100 mg via ORAL
  Filled 2016-04-23 (×7): qty 1

## 2016-04-23 MED ORDER — ETOMIDATE 2 MG/ML IV SOLN
20.0000 mg | Freq: Once | INTRAVENOUS | Status: AC
  Administered 2016-04-23: 20 mg via INTRAVENOUS

## 2016-04-23 MED ORDER — DEXTROSE 50 % IV SOLN
INTRAVENOUS | Status: AC
  Filled 2016-04-23: qty 50

## 2016-04-23 MED ORDER — MIDAZOLAM HCL 2 MG/2ML IJ SOLN
INTRAMUSCULAR | Status: AC
  Administered 2016-04-23: 4 mg
  Filled 2016-04-23: qty 4

## 2016-04-23 MED ORDER — FENTANYL CITRATE (PF) 100 MCG/2ML IJ SOLN
INTRAMUSCULAR | Status: AC
  Administered 2016-04-23: 200 ug
  Filled 2016-04-23: qty 4

## 2016-04-23 MED ORDER — INFLUENZA VAC SPLIT QUAD 0.5 ML IM SUSY
0.5000 mL | PREFILLED_SYRINGE | INTRAMUSCULAR | Status: AC
  Administered 2016-04-24: 0.5 mL via INTRAMUSCULAR
  Filled 2016-04-23: qty 0.5

## 2016-04-23 NOTE — Progress Notes (Signed)
S: little change since yesterday.  LTM was negative.  Team currently actively attempting to extubate patient. O: 119/73 88 100% Intubated. She is off sedation and combative trying to pull at the ETT tube. Moves all extremities equally. Not following commands nor tracking, but keeps eyes open. A/P: opioid intoxication, now beginning to wake up.  Possible extubation as soon as her mental status improves.  EEG was d/c as low suspicion for sz.  Will continue to follow

## 2016-04-23 NOTE — Progress Notes (Signed)
PULMONARY / CRITICAL CARE MEDICINE   Name: Jane Watkins MRN: [id number] DOB: 1995-06-10    ADMISSION DATE:  04/19/2016  CHIEF COMPLAINT:  Unresponsive  BRIEF SUMMARY:  21 y/o F with a history of opiate abuse who was found next to her boyfriend (who was deceased). She was unresponsive. She was brought to [facility] where she was found to combative and was intubated for airway protection, concern for aspiration. Her CK was 15k, and had a mild lactic acidosis. She was brought to [facility] for further evaluation.  SUBJECTIVE:  Continuous EEG in place.  RN reports intermittent agitation despite sedation.   VITAL SIGNS: BP (!) 99/58   Pulse 96   Temp 99 F (37.2 C) (Axillary)   Resp (!) 22   Ht 5\' 4"  (1.626 m)   Wt 131 lb 6.3 oz (59.6 kg)   SpO2 91%   BMI 22.55 kg/m   HEMODYNAMICS:    VENTILATOR SETTINGS: Vent Mode: PRVC FiO2 (%):  [30 %] 30 % Set Rate:  [22 bmp] 22 bmp Vt Set:  [450 mL] 450 mL PEEP:  [5 cmH20] 5 cmH20 Plateau Pressure:  [19 cmH20-22 cmH20] 21 cmH20  INTAKE / OUTPUT: I/O last 3 completed shifts: In: 4200.5 [I.V.:2785.5; IV Piggyback:1415] Out: 4475 [Urine:3275; Emesis/NG output:1200]  PHYSICAL EXAMINATION: General: young adult female in NAD on vent HEENT: MM pink/moist, ETT Neuro: sedate, intermittent agitation, moves all extremities but no follow commands CV: s1s2 rrr, no m/r/g PULM: even/non-labored, lungs bilaterally clear WU:JWJX, non-tender, bsx4 active  Extremities: warm/dry, no edema  Skin: no rashes or lesions    LABS:  BMET  Recent Labs Lab 04/21/16 0235 04/22/16 0334 04/23/16 0535  NA 139 142 142  K 3.0* 3.5 3.5  CL 110 111 112*  CO2 23 23 19*  BUN <5* 5* <5*  CREATININE 0.57 0.66 0.51  GLUCOSE 88 87 74   Electrolytes  Recent Labs Lab 04/21/16 0235 04/22/16 0334 04/23/16 0535  CALCIUM 7.9* 8.2* 8.0*  MG  --   --  1.9   CBC  Recent Labs Lab 04/21/16 0235 04/22/16 0334 04/23/16 0535  WBC 12.9* 12.5* 7.6  HGB 11.2*  11.3* 10.0*  HCT 33.9* 34.0* 30.5*  PLT 186 203 179   Coag's No results for input(s): APTT, INR in the last 168 hours.  Sepsis Markers  Recent Labs Lab 04/19/16 1742 04/19/16 2325 04/20/16 0450  LATICACIDVEN 3.4* 1.0 0.8   ABG  Recent Labs Lab 04/19/16 2120 04/20/16 1750 04/21/16 0340  PHART 7.353 7.438 7.456*  PCO2ART 44.2 34.8 35.0  PO2ART 194.0* 191* 120*   Liver Enzymes  Recent Labs Lab 04/19/16 1742 04/21/16 0235 04/22/16 0334  AST 174* 66* 55*  ALT 142* 68* 60*  ALKPHOS 47 42 42  BILITOT 1.3* 1.5* 1.5*  ALBUMIN 3.8 2.7* 3.0*   Cardiac Enzymes  Recent Labs Lab 04/20/16 0450 04/20/16 1149 04/22/16 1203  TROPONINI 2.94* 2.34* 0.51*   Glucose  Recent Labs Lab 04/19/16 2024  GLUCAP 84   Imaging Dg Chest Port 1 View  Result Date: 04/23/2016 CLINICAL DATA:  Acute respiratory failure with hypoxia, intubated patient. Opioid overdose with encephalopathy. Current smoker. EXAM: PORTABLE CHEST 1 VIEW COMPARISON:  Portable chest x-ray of April 22, 2016 FINDINGS: The lungs are well-expanded. The pulmonary interstitial markings are less prominent. There remains central pulmonary vascular prominence. There is no significant pleural effusion. The heart is normal in size. The endotracheal tube tip lies 2 point 5 cm above the Watkins. The esophagogastric tube tip  in proximal port project below the inferior margin of the image. The right-sided PICC line tip projects over the right cavoatrial junction. IMPRESSION: Improving pulmonary interstitial edema. No discrete infiltrate. Normal cardiac size. The support tubes are in reasonable position. Electronically Signed   By: David  [place] M.D.   On: 04/23/2016 07:59    STUDIES:  Head CT 3/10 >> Normal ECHO 3/11 >> LVEF 65-70%, no RWMA, no evidence of vegetation Continuous EEG 3/13 >> MRI 3/13 >> unremarkable appearance of the brain  CULTURES: Sputum 3/11 >> MRSA  BCx2 3/11 >>  UC 3/10 >> greater 100k coag neg staph >>  sens vanco  ANTIBIOTICS: Unasyn 3/11 >> 3/14 Vancomycin 3/11 >>   SIGNIFICANT EVENTS: 3/10  Admit after overdose 3/13  Neurology consulted, continuous EEG initiated  LINES/TUBES: ETT 3/10 >>   DISCUSSION: 21 y/o p/w AMS and intoxication following apparent opiate O/D.  Boyfriend was deceased at her side.   ASSESSMENT / PLAN:  PULMONARY A: Need for mechanical ventilation Acute Respiratory failure RLL Aspiration PNA - mild airspace disease Tobacco Abuse  P:   Continue vent support  PRVC 8 cc/kg  Wean PEEP / FiO2 for sats > 92% Intermittent CXR Avoid periods of hypoxemia  PRN bronchodilators PSV as tolerated  CARDIOVASCULAR A:  Troponinemia - likely demand ischemia P:  ICU monitoring  Troponin cleared   RENAL A:   Rhabdomyolysis Lactic acidosis Hypokalemia  P:   LR at 50 ml/hr  Trend BMP / urinary output Replace electrolytes as indicated Avoid nephrotoxic agents, ensure adequate renal perfusion Trend CK > clearing  GASTROINTESTINAL A:   No active issues P:   Begin TF  NPO / OGT PPI for SUP  HEMATOLOGIC A:   No active issues P:  Lovenox for DVT prophylaxis   INFECTIOUS A:   Suspected Aspiration - minimal airspace disease on CXR MRSA in Sputum  Coag Neg Staph UTI P:   Trend PCT Follow cultures  Discontinue unasyn Vanco D4/x  ENDOCRINE A:   No active issues   P:   Trend glucose on BMP  NEUROLOGIC A:   Opiate Overdose  Acute Encephalopathy  Rule Out Anoxic Injury P:   RASS Goal: 0 to -1  Trial of precedex  Discontinue propofol  Fentanyl gtt for pain Appreciate Neurology input  Continuous EEG in process   FAMILY  - Updates:  Father and Step-mother updated at bedside 3/14.    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  Patient is on continuous EEG.  On exam, unresponsive.  Lungs with coarse BS diffusely.  I reviewed CXR myself, ETT in good position.  EEG to be stopped today at 3 PM, once complete then will hold sedation and attempt a trial extubation prior to consideration of a trach.  Family updated bedside.  The patient is critically ill with multiple organ systems failure and requires high complexity decision making for assessment and support, frequent evaluation and titration of therapies, application of advanced monitoring technologies and extensive interpretation of multiple databases.   Critical Care Time devoted to patient care services described in this note is  35  Minutes. This time reflects time of care of this signee Dr Koren BoundWesam Yacoub. This critical care time does not reflect  procedure time, or teaching time or supervisory time of PA/NP/Med student/Med Resident etc but could involve care discussion time.  Alyson Reedy, M.D. [facility] Pulmonary/Critical Care Medicine. Pager: [phone]. After hours pager: [phone].

## 2016-04-23 NOTE — Progress Notes (Signed)
LTM discontinued; no skin breakdown was seen. 

## 2016-04-23 NOTE — Progress Notes (Signed)
Hypoglycemic Event  CBG: 69  Treatment: D50 IV 25 mL  Symptoms: None  Follow-up CBG: Time:2047 CBG Result:132  Possible Reasons for Event: Unknown  Comments/MD notified:    Tanea Moga, [place] Marie 04/23/2016 11:58 PM

## 2016-04-23 NOTE — Procedures (Signed)
Intubation Procedure Note Vito Backers [id number] 01-23-1996  Procedure: Intubation Indications: Respiratory insufficiency  Procedure Details Consent: Unable to obtain consent because of emergent medical necessity. Time Out: Verified patient identification, verified procedure, site/side was marked, verified correct patient position, special equipment/implants available, medications/allergies/relevent history reviewed, required imaging and test results available.  Performed  Maximum sterile technique was used including gloves, hand hygiene and mask.  MAC    Evaluation Hemodynamic Status: BP stable throughout; O2 sats: stable throughout Patient's Current Condition: stable Complications: No apparent complications Patient did tolerate procedure well. Chest X-ray ordered to verify placement.  CXR: pending.   Jennet Maduro 04/23/2016

## 2016-04-23 NOTE — Procedures (Signed)
Electroencephalogram report- LTM  Ordering Physician : Dr. Cena BentonVega    Beginning date or time: 04/22/2016 2:06 PM Ending date or time: 04/23/2016 7:30AM  Day of study: day 1  Medications include: Per EMR  MENTAL STATUS (per technician's notes): Intubated  HISTORY: This 24 hours of intensive EEG monitoring with simultaneous video monitoring was performed for this patient with altered mental status and difficult to extubate. This EEG was requested to rule out subclinical electrographic seizures contributing to the mental status.  TECHNICAL DESCRIPTION:  The study consists of a continuous 16-channel multi-montage digital video EEG recording with twenty-one electrodes placed according to the International 10-20 System. Additional leads included eye leads, true temporal leads (T1, T2), and an EKG lead. Activation procedures were not done due to mental status.  REPORT: The background activity in this tracing consisted of polymorphic delta and theta background, with overriding faster frequencies. The  Is symmetric bilaterally. Stimulation and state change result in appearance of generalized rhythmic delta activity. No focal slowing or epileptiform activity was identified.    There were no push button events during the monitoring.  IMPRESSION: This is an abnormal EEG due to  Diffuse slowing - Delta and theta activity  CLINICAL CORRELATION: This EEG is consistent with severe non-specific diffuse cerebral dysfunction, at least in part due to sedating medications. No electrographic seizures were seen to explain the patients altered mental status. Clinical correlation is required.

## 2016-04-23 NOTE — Procedures (Signed)
OGT Placement By Physician  OGT inserted under direct laryngoscopy.  Jane ReedyWesam G. Watkins, M.D. [facility] Pulmonary/Critical Care Medicine. Pager: [phone]. After hours pager: [phone].

## 2016-04-24 ENCOUNTER — Inpatient Hospital Stay (HOSPITAL_COMMUNITY): Payer: 59

## 2016-04-24 DIAGNOSIS — M6282 Rhabdomyolysis: Secondary | ICD-10-CM

## 2016-04-24 LAB — PREGNANCY, URINE: PREG TEST UR: NEGATIVE

## 2016-04-24 LAB — BASIC METABOLIC PANEL
Anion gap: 7 (ref 5–15)
CO2: 19 mmol/L — AB (ref 22–32)
CREATININE: 0.53 mg/dL (ref 0.44–1.00)
Calcium: 8.2 mg/dL — ABNORMAL LOW (ref 8.9–10.3)
Chloride: 113 mmol/L — ABNORMAL HIGH (ref 101–111)
GFR calc Af Amer: >60 mL/min (ref >60)
GFR calc non Af Amer: >60 mL/min (ref >60)
GLUCOSE: 132 mg/dL — AB (ref 65–99)
Potassium: 3.5 mmol/L (ref 3.5–5.1)
Sodium: 139 mmol/L (ref 135–145)

## 2016-04-24 LAB — CBC
HEMATOCRIT: 29.9 % — AB (ref 36.0–46.0)
Hemoglobin: 10.2 g/dL — ABNORMAL LOW (ref 12.0–15.0)
MCH: 30 pg (ref 26.0–34.0)
MCHC: 34.1 g/dL (ref 30.0–36.0)
MCV: 87.9 fL (ref 78.0–100.0)
Platelets: 197 10*3/uL (ref 150–400)
RBC: 3.4 MIL/uL — ABNORMAL LOW (ref 3.87–5.11)
RDW: 12.9 % (ref 11.5–15.5)
WBC: 8.5 10*3/uL (ref 4.0–10.5)

## 2016-04-24 LAB — GLUCOSE, CAPILLARY
GLUCOSE-CAPILLARY: 96 mg/dL (ref 65–99)
GLUCOSE-CAPILLARY: 121 mg/dL — AB (ref 65–99)
GLUCOSE-CAPILLARY: 140 mg/dL — AB (ref 65–99)
Glucose-Capillary: 125 mg/dL — ABNORMAL HIGH (ref 65–99)
Glucose-Capillary: 137 mg/dL — ABNORMAL HIGH (ref 65–99)
Glucose-Capillary: 150 mg/dL — ABNORMAL HIGH (ref 65–99)
Glucose-Capillary: 92 mg/dL (ref 65–99)

## 2016-04-24 LAB — VANCOMYCIN, TROUGH: Vancomycin Tr: 9 ug/mL — ABNORMAL LOW (ref 15–20)

## 2016-04-24 LAB — PHOSPHORUS
Phosphorus: 3.5 mg/dL (ref 2.5–4.6)
Phosphorus: 3.8 mg/dL (ref 2.5–4.6)

## 2016-04-24 LAB — MAGNESIUM
MAGNESIUM: 1.5 mg/dL — AB (ref 1.7–2.4)
Magnesium: 1.8 mg/dL (ref 1.7–2.4)

## 2016-04-24 LAB — TRIGLYCERIDES: Triglycerides: 173 mg/dL — ABNORMAL HIGH (ref <150)

## 2016-04-24 MED ORDER — PROPOFOL 1000 MG/100ML IV EMUL
0.0000 ug/kg/min | INTRAVENOUS | Status: DC
  Administered 2016-04-24: 50 ug/kg/min via INTRAVENOUS
  Administered 2016-04-24: 40 ug/kg/min via INTRAVENOUS
  Administered 2016-04-24: 30 ug/kg/min via INTRAVENOUS
  Administered 2016-04-25: 50 ug/kg/min via INTRAVENOUS
  Administered 2016-04-29 (×2): 35 ug/kg/min via INTRAVENOUS
  Filled 2016-04-24 (×12): qty 100

## 2016-04-24 MED ORDER — VITAL HIGH PROTEIN PO LIQD
1000.0000 mL | ORAL | Status: DC
  Administered 2016-04-24 – 2016-04-26 (×3): 1000 mL
  Filled 2016-04-24: qty 1000

## 2016-04-24 MED ORDER — POTASSIUM CHLORIDE 20 MEQ/15ML (10%) PO SOLN
20.0000 meq | ORAL | Status: AC
  Administered 2016-04-24 (×2): 20 meq
  Filled 2016-04-24 (×2): qty 15

## 2016-04-24 MED ORDER — SODIUM CHLORIDE 0.9 % IV SOLN
4.0000 mg | Freq: Four times a day (QID) | INTRAVENOUS | Status: AC
  Administered 2016-04-24 – 2016-04-25 (×4): 4 mg via INTRAVENOUS
  Filled 2016-04-24 (×4): qty 0.4

## 2016-04-24 MED ORDER — FLUOXETINE HCL 20 MG PO TABS
30.0000 mg | ORAL_TABLET | Freq: Every day | ORAL | Status: DC
  Filled 2016-04-24: qty 2

## 2016-04-24 MED ORDER — QUETIAPINE FUMARATE 25 MG PO TABS
25.0000 mg | ORAL_TABLET | Freq: Two times a day (BID) | ORAL | Status: DC
  Administered 2016-04-24 – 2016-04-28 (×9): 25 mg via ORAL
  Filled 2016-04-24 (×11): qty 1

## 2016-04-24 MED ORDER — CLONAZEPAM 0.5 MG PO TBDP
0.5000 mg | ORAL_TABLET | Freq: Two times a day (BID) | ORAL | Status: DC
  Administered 2016-04-24 – 2016-04-25 (×3): 0.5 mg via ORAL
  Filled 2016-04-24 (×3): qty 1

## 2016-04-24 MED ORDER — FLUOXETINE HCL 20 MG PO CAPS
30.0000 mg | ORAL_CAPSULE | Freq: Every day | ORAL | Status: DC
  Administered 2016-04-24 – 2016-04-27 (×4): 30 mg via ORAL
  Filled 2016-04-24 (×8): qty 1

## 2016-04-24 MED ORDER — VANCOMYCIN HCL 10 G IV SOLR
1750.0000 mg | Freq: Three times a day (TID) | INTRAVENOUS | Status: AC
  Administered 2016-04-24 – 2016-04-29 (×16): 1750 mg via INTRAVENOUS
  Filled 2016-04-24 (×16): qty 1750

## 2016-04-24 MED ORDER — PROPOFOL 1000 MG/100ML IV EMUL
INTRAVENOUS | Status: AC
  Filled 2016-04-24: qty 100

## 2016-04-24 NOTE — Progress Notes (Signed)
[facility] LAB REPLACEMENT ONLY  The patient does [facility] Protocol based on the criteria listed below:   1. Is GFR >/= 40 ml/min? Yes.    Patient's GFR today is >60 2. Is urine output >/= 0.5 ml/kg/hr for the last 6 hours? Yes.   Patient's UOP is 1.42 ml/kg/hr 3. Is BUN < 60 mg/dL? Yes.    Patient's BUN today is <5 4. Abnormal electrolyte(s):Potassium  5. Ordered repletion with: Potassium per Protocol 6. If a panic level lab has been reported, has the CCM MD in charge been notified? No..   Physician:    Thomasenia BottomsLANTZY, Charlsey Moragne P 04/24/2016 5:48 AM

## 2016-04-24 NOTE — Progress Notes (Signed)
Patient very agitated with the precedex and fentanyl maxed out. Opens eyes but still unable to follow commands. Moves all extremities strongly and is at risk of self extubating. Notified [facility], order received to restart propofol gtt. Will continue to monitor.

## 2016-04-24 NOTE — Progress Notes (Signed)
eLink Physician-Brief Progress Note Patient Name: Jane Watkins DOB: 09/30/1995 MRN: [id number]   Date of Service  04/24/2016  HPI/Events of Note  Hypoxia - FiO2 requirement has increased from 40% to 70%. PEEP has gone up from 5 to 8.   eICU Interventions  Will order: 1. Portable CXR now.     Intervention Category Major Interventions: Hypoxemia - evaluation and management  Jane Watkins 04/24/2016, 5:13 PM

## 2016-04-24 NOTE — Progress Notes (Addendum)
Subjective: Patient is currently intubated and breathing over the vent. Patient will open her eyes and withdrawal from pain but does not follow any commands. In attempting to look at her she actually rolls them to the back of her head and then clenches her eyelid shot.  Exam: Vitals:   04/24/16 0800 04/24/16 0801  BP: (!) 93/54 (!) 93/54  Pulse: 87 93  Resp: (!) 22 20  Temp: 99.1 F (37.3 C)       Neuro:  CN: Pupils are equal and round. They are symmetrically reactive from 3-->2 mm. EOMI without nystagmus. Facial sensation is intact to light touch. Face is symmetric at rest with normal strength and mobility. Hearing is intact to conversational voice. Palate elevates symmetrically and uvula is midline. Voice is normal in tone, pitch and quality. Bilateral SCM and trapezii are 5/5. Tongue is midline with normal bulk and mobility.  Motor: Normal bulk, tone, and strength. 5/5 throughout--when noxious stimuli is given to both upper and lower extremities Sensation: As stated withdraws briskly from noxious stimuli in all 4 extremities  DTRs: 2+, symmetric  Toes downgoing bilaterally. No pathologic reflexes.  Coordination: Unable to obtain secondary to patient being encephalopathic  Pertinent Labs/Diagnostics: MRI brain: IMPRESSION: Unremarkable appearance of the brain.  EEG of brain: IMPRESSION: This is an abnormal EEG due to  Diffuse slowing - Delta and theta activity  CLINICAL CORRELATION: This EEG is consistent with severe non-specific diffuse cerebral dysfunction, at least in part due to sedating medications. No electrographic seizures were seen to explain the patients altered mental status.   Felicie MornDavid Smith PA-C [facility] [phone]  Impression: Opiate overdose, initially started to wake up and was extubated however due to significant stridor patient required reintubation. EEG showed no etiology or signs of seizure. MRI did not show any abnormalities. At this point  patient is intubated and on sedation.  Discussed with PCCM  We will continue to follow     04/24/2016, 11:19 AM

## 2016-04-24 NOTE — Progress Notes (Addendum)
Pharmacy Antibiotic Note Jane Watkins is a 21 y.o. female admitted on 04/19/2016 that is currently on day 5 of vancomycin for treatment of aspiration and MRSA PNA.   Vancomycin trough of 9 this afternoon is below desired range of 15-20 based on indication. Level was drawn a little late but would not have resulted in subtherapeutic level. Renal function is stable.   Plan: Adjust vancomycin to 1750 mg IV every 8 hours  Monitor culture data, renal function and clinical course [facility] with higher dosing   Height: 5\' 4"  (162.6 cm) Weight: 130 lb 11.7 oz (59.3 kg) IBW/kg (Calculated) : 54.7  Temp (24hrs), Avg:98.5 F (36.9 C), Min:97.6 F (36.4 C), Max:99.2 F (37.3 C)   Recent Labs Lab 04/19/16 1742 04/19/16 2324 04/19/16 2325 04/20/16 0450 04/21/16 0235 04/22/16 0334 04/22/16 1505 04/23/16 0535 04/24/16 0254 04/24/16 1343  WBC 21.8* 17.7*  --   --  12.9* 12.5*  --  7.6 8.5  --   CREATININE 0.74 0.78  --   --  0.57 0.66  --  0.51 0.53  --   LATICACIDVEN 3.4*  --  1.0 0.8  --   --   --   --   --   --   VANCOTROUGH  --   --   --   --   --   --  8*  --   --  9*    Estimated Creatinine Clearance: 96.9 mL/min (by C-G formula based on SCr of 0.53 mg/dL).    Allergies  Allergen Reactions  . Other Rash    Tide detergent   . Sulfa Antibiotics Rash    Antimicrobials this admission: Zosyn 3/10 x1 Unasyn 3/11>> Vancomycin 3/11>>  Microbiology results: 3/11 blood cx: ngtd 3/11 resp cx: MRSA 3/10 UCx: CoNS 3/10 MRSA PCR: pos  Jane Watkins, PharmD, BCPS Clinical Pharmacist [phone] 04/24/2016, 2:19 PM

## 2016-04-24 NOTE — Progress Notes (Signed)
PULMONARY / CRITICAL CARE MEDICINE   Name: Laqueta Carina MRN: [id number] DOB: 1995/02/13    ADMISSION DATE:  04/19/2016  CHIEF COMPLAINT:  Unresponsive  BRIEF SUMMARY:  21 y/o F with a history of opiate abuse who was found next to her boyfriend (who was deceased). She was unresponsive. She was brought to [facility] where she was found to combative and was intubated for airway protection, concern for aspiration. Her CK was 15k, and had a mild lactic acidosis. She was brought to [facility] for further evaluation.  SUBJECTIVE:  Continuous EEG in place.  Wild despite sedation.   VITAL SIGNS: BP (!) 93/54   Pulse 93   Temp 97.6 F (36.4 C) (Axillary)   Resp 20   Ht 5\' 4"  (1.626 m)   Wt 59.3 kg (130 lb 11.7 oz)   SpO2 99%   BMI 22.44 kg/m   HEMODYNAMICS:    VENTILATOR SETTINGS: Vent Mode: PRVC FiO2 (%):  [30 %-100 %] 40 % Set Rate:  [22 bmp] 22 bmp Vt Set:  [450 mL] 450 mL PEEP:  [5 cmH20] 5 cmH20 Plateau Pressure:  [13 cmH20-21 cmH20] 19 cmH20  INTAKE / OUTPUT: I/O last 3 completed shifts: In: 5430.6 [I.V.:3265.3; NG/GT:715.3; IV Piggyback:1450] Out: 5050 [Urine:4450; Emesis/NG output:600]  PHYSICAL EXAMINATION: General: young adult female in NAD on vent HEENT: MM pink/moist, ETT Neuro: Sedated but writhing in bed CV: s1s2 rrr, no m/r/g PULM: even/non-labored, lungs bilaterally clear NW:GNFA, non-tender, bsx4 active  Extremities: warm/dry, no edema  Skin: no rashes or lesions    LABS:  BMET  Recent Labs Lab 04/22/16 0334 04/23/16 0535 04/24/16 0254  NA 142 142 139  K 3.5 3.5 3.5  CL 111 112* 113*  CO2 23 19* 19*  BUN 5* <5* <5*  CREATININE 0.66 0.51 0.53  GLUCOSE 87 74 132*   Electrolytes  Recent Labs Lab 04/22/16 0334  04/23/16 0535 04/23/16 1142 04/23/16 1827 04/24/16 0254  CALCIUM 8.2*  --  8.0*  --   --  8.2*  MG  --   < > 1.9 1.9 1.7 1.5*  PHOS  --   --   --  4.1 4.6 3.5  < > = values in this interval not displayed. CBC  Recent Labs Lab  04/22/16 0334 04/23/16 0535 04/24/16 0254  WBC 12.5* 7.6 8.5  HGB 11.3* 10.0* 10.2*  HCT 34.0* 30.5* 29.9*  PLT 203 179 197   Coag's No results for input(s): APTT, INR in the last 168 hours.  Sepsis Markers  Recent Labs Lab 04/19/16 1742 04/19/16 2325 04/20/16 0450  LATICACIDVEN 3.4* 1.0 0.8   ABG  Recent Labs Lab 04/20/16 1750 04/21/16 0340 04/23/16 1756  PHART 7.438 7.456* 7.299*  PCO2ART 34.8 35.0 38.3  PO2ART 191* 120* 136.0*   Liver Enzymes  Recent Labs Lab 04/19/16 1742 04/21/16 0235 04/22/16 0334  AST 174* 66* 55*  ALT 142* 68* 60*  ALKPHOS 47 42 42  BILITOT 1.3* 1.5* 1.5*  ALBUMIN 3.8 2.7* 3.0*   Cardiac Enzymes  Recent Labs Lab 04/20/16 0450 04/20/16 1149 04/22/16 1203  TROPONINI 2.94* 2.34* 0.51*   Glucose  Recent Labs Lab 04/19/16 2024 04/23/16 2018 04/23/16 2047 04/24/16 0007 04/24/16 0337  GLUCAP 84 69 132* 92 125*   Imaging Dg Chest Port 1 View  Result Date: 04/24/2016 CLINICAL DATA:  Acute respiratory failure EXAM: PORTABLE CHEST 1 VIEW COMPARISON:  Yesterday FINDINGS: Endotracheal tube tip 11 mm above the carina. An orogastric tube reaches stomach at least. Improved aeration  at the bases with better defined diaphragm. No edema, visible effusion, or pneumothorax. Normal heart size. IMPRESSION: 1. Endotracheal tube tip 11 mm above the carina. 2. Improved aeration compared to yesterday. Electronically Signed   By: Marnee Spring M.D.   On: 04/24/2016 07:22   Dg Chest Port 1 View  Result Date: 04/23/2016 CLINICAL DATA:  Intubation EXAM: PORTABLE CHEST 1 VIEW COMPARISON:  Radiograph 04/23/2016 FINDINGS: Endotracheal tube and PICC line are unchanged. Normal cardiac silhouette. New moderate bilateral pleural effusions. NG tube extends into the stomach. No pneumothorax. IMPRESSION: 1. Stable support apparatus. 2. New bilateral pleural effusions. These results will be called to the ordering clinician or representative by the Radiologist  Assistant, and communication documented in the PACS or zVision Dashboard. Electronically Signed   By: Genevive Bi M.D.   On: 04/23/2016 16:30    STUDIES:  Head CT 3/10 >> Normal ECHO 3/11 >> LVEF 65-70%, no RWMA, no evidence of vegetation Continuous EEG 3/13 >> MRI 3/13 >> unremarkable appearance of the brain  CULTURES: Sputum 3/11 >> MRSA  BCx2 3/11 >>  UC 3/10 >> greater 100k coag neg staph >> sens vanco  ANTIBIOTICS: Unasyn 3/11 >> 3/14 Vancomycin 3/11 >>   SIGNIFICANT EVENTS: 3/10  Admit after overdose 3/13  Neurology consulted, continuous EEG initiated 3/15 wild  LINES/TUBES: ETT 3/10 >> 3/14 ett 3/14(FAILED EXTUBATION)>>  DISCUSSION: 21 y/o p/w AMS and intoxication following apparent opiate O/D.  Boyfriend was deceased at her side.   ASSESSMENT / PLAN:  PULMONARY A: Need for mechanical ventilation(extubated 3/14 and developed stridor and reintubated) Acute Respiratory failure, stridor post extubation RLL Aspiration PNA - mild airspace disease Tobacco Abuse  P:   Continue vent support  PRVC 8 cc/kg  Wean PEEP / FiO2 for sats > 92% Intermittent CXR Avoid periods of hypoxemia  PRN bronchodilators PSV as tolerated  CARDIOVASCULAR A:  Troponinemia - likely demand ischemia P:  ICU monitoring  Troponin cleared   RENAL  Recent Labs Lab 04/22/16 0334 04/23/16 0535 04/24/16 0254  K 3.5 3.5 3.5     A:   Rhabdomyolysis(improving) Lactic acidosis->resolved Hypokalemia  P:   LR at 50 ml/hr  Trend BMP / urinary output Replace electrolytes as indicated Avoid nephrotoxic agents, ensure adequate renal perfusion Trend CK > clearing  GASTROINTESTINAL A:   No active issues P:   Begin TF  NPO / OGT PPI for SUP  HEMATOLOGIC A:   No active issues P:  Lovenox for DVT prophylaxis   INFECTIOUS A:   Suspected Aspiration - minimal airspace disease on CXR MRSA in Sputum  Coag Neg Staph UTI P:   Trend PCT Follow cultures  Discontinue  unasyn Vanco D5/x  ENDOCRINE A:   No active issues   P:   Trend glucose on BMP  NEUROLOGIC A:   Opiate Overdose  Acute Encephalopathy  Rule Out Anoxic Injury, not evident on head MRI 3/13 P:   RASS Goal: 0 to -1  precedex   propofol  Fentanyl gtt for pain Appreciate Neurology input  Continuous EEG in process  Thiamine/folic acid   FAMILY  - Updates:  Father and Step-mother updated at bedside 3/14.  No family at bedside 3/15 at time of exam.  - Inter-disciplinary family meet or Palliative Care meeting due by:  04/26/16   NP CC Time: 30 minutes  Brett Canales Minor ACNP Adolph Pollack PCCM Pager [phone] till 3 pm If no answer page [phone] 04/24/2016, 8:26 AM   ATTENDING NOTE / ATTESTATION NOTE :  I have discussed the case with the resident/APP  Brett Canales Minor NP  I agree with the resident/APP's  history, physical examination, assessment, and plans.    I have edited the above note and modified it according to our agreed history, physical examination, assessment and plan.   Briefly, 21 y/o F with a history of opiate abuse who was found next to her boyfriend (who was deceased). She was unresponsive. She was brought to [facility] where she was found to combative and was intubated for airway protection, concern for aspiration. Her CK was 15k, and had a mild lactic acidosis. She was brought to [facility] for further evaluation.  Patient was extubated on 3/14 but had to be re-intubated for stridor.  Gets intermittently agitated. BP is soft.   Vitals:  Vitals:   04/24/16 0700 04/24/16 0746 04/24/16 0800 04/24/16 0801  BP: (!) 89/53 (!) 89/53 (!) 93/54 (!) 93/54  Pulse: 71  87 93  Resp: (!) 22  (!) 22 20  Temp:   99.1 F (37.3 C)   TempSrc:   Axillary   SpO2: 100%  100% 99%  Weight:      Height:        Constitutional/General: well-nourished, well-developed, intubated, sedated, not in any distress, moves all over the bed  Body mass index is 22.44 kg/m. Wt Readings from Last 3  Encounters:  04/24/16 59.3 kg (130 lb 11.7 oz)  02/02/16 52.2 kg (115 lb)  12/16/15 52.2 kg (115 lb)    HEENT: PERLA, anicteric sclerae. (-) Oral thrush. Intubated, ETT in place  Neck: No masses. Midline trachea. No JVD, (-) LAD. (-) bruits appreciated.  Respiratory/Chest: Grossly normal chest. (-) deformity. (-) Accessory muscle use.  Symmetric expansion. Diminished BS on both lower lung zones. (-) wheezing, crackles, rhonchi (-) egophony  Cardiovascular: Regular rate and  rhythm, heart sounds normal, no murmur or gallops,  Trace peripheral edema  Gastrointestinal:  Normal bowel sounds. Soft, non-tender. No hepatosplenomegaly.  (-) masses.   Musculoskeletal:  Normal muscle tone.   Extremities: Grossly normal. (-) clubbing, cyanosis.  (-) edema  Skin: (-) rash,lesions seen.   Neurological/Psychiatric : sedated, intubated. CN grossly intact. (-) lateralizing signs.  Moves both UE and LE but non purposeful. Awake but does NOT follow commands.  I am not sure if she tries to track when you call her name.     CBC Recent Labs     04/22/16  0334  04/23/16  0535  04/24/16  0254  WBC  12.5*  7.6  8.5  HGB  11.3*  10.0*  10.2*  HCT  34.0*  30.5*  29.9*  PLT  203  179  197    Coag's No results for input(s): APTT, INR in the last 72 hours.  BMET Recent Labs     04/22/16  0334  04/23/16  0535  04/24/16  0254  NA  142  142  139  K  3.5  3.5  3.5  CL  111  112*  113*  CO2  23  19*  19*  BUN  5*  <5*  <5*  CREATININE  0.66  0.51  0.53  GLUCOSE  87  74  132*    Electrolytes Recent Labs     04/22/16  0334   04/23/16  0535  04/23/16  1142  04/23/16  1827  04/24/16  0254  CALCIUM  8.2*   --   8.0*   --    --   8.2*  MG   --    < >  1.9  1.9  1.7  1.5*  PHOS   --    --    --   4.1  4.6  3.5   < > = values in this interval not displayed.    Sepsis Markers No results for input(s): PROCALCITON, O2SATVEN in the last 72 hours.  Invalid input(s):  LACTICACIDVEN  ABG Recent Labs     04/23/16  1756  PHART  7.299*  PCO2ART  38.3  PO2ART  136.0*    Liver Enzymes Recent Labs     04/22/16  0334  AST  55*  ALT  60*  ALKPHOS  42  BILITOT  1.5*  ALBUMIN  3.0*    Cardiac Enzymes Recent Labs     04/22/16  1203  TROPONINI  0.51*    Glucose Recent Labs     04/23/16  2018  04/23/16  2047  04/24/16  0007  04/24/16  0337  04/24/16  0825  GLUCAP  69  132*  92  125*  96    Imaging Mr Brain Wo Contrast  Result Date: 04/22/2016 CLINICAL DATA:  Found unresponsive.  Opiate overdose. EXAM: MRI HEAD WITHOUT CONTRAST TECHNIQUE: Multiplanar, multiecho pulse sequences of the brain and surrounding structures were obtained without intravenous contrast. COMPARISON:  Head CT 04/19/2016 FINDINGS: Brain: There is no evidence of acute infarct, intracranial hemorrhage, mass, midline shift, or extra-axial fluid collection. The ventricles and sulci are normal in size. FLAIR sulcal hyperintensity is attributed to the patient's intubated state with supplemental oxygenation. The cerebellar tonsils project at or minimally below the foramen magnum, within normal limits. The brain is normal in signal. Vascular: Major intracranial vascular flow voids are preserved. Skull and upper cervical spine: Unremarkable bone marrow signal. Sinuses/Orbits: Unremarkable orbits. Fluid in the sphenoid sinuses. Mild bilateral ethmoid air cell mucosal thickening. Other: None. IMPRESSION: Unremarkable appearance of the brain. Electronically Signed   By: Sebastian Ache M.D.   On: 04/22/2016 12:30   Dg Chest Port 1 View  Result Date: 04/24/2016 CLINICAL DATA:  Acute respiratory failure EXAM: PORTABLE CHEST 1 VIEW COMPARISON:  Yesterday FINDINGS: Endotracheal tube tip 11 mm above the carina. An orogastric tube reaches stomach at least. Improved aeration at the bases with better defined diaphragm. No edema, visible effusion, or pneumothorax. Normal heart size. IMPRESSION: 1.  Endotracheal tube tip 11 mm above the carina. 2. Improved aeration compared to yesterday. Electronically Signed   By: Marnee Spring M.D.   On: 04/24/2016 07:22   Dg Chest Port 1 View  Result Date: 04/23/2016 CLINICAL DATA:  Intubation EXAM: PORTABLE CHEST 1 VIEW COMPARISON:  Radiograph 04/23/2016 FINDINGS: Endotracheal tube and PICC line are unchanged. Normal cardiac silhouette. New moderate bilateral pleural effusions. NG tube extends into the stomach. No pneumothorax. IMPRESSION: 1. Stable support apparatus. 2. New bilateral pleural effusions. These results will be called to the ordering clinician or representative by the Radiologist Assistant, and communication documented in the PACS or zVision Dashboard. Electronically Signed   By: Genevive Bi M.D.   On: 04/23/2016 16:30   Dg Chest Port 1 View  Result Date: 04/23/2016 CLINICAL DATA:  Acute respiratory failure with hypoxia, intubated patient. Opioid overdose with encephalopathy. Current smoker. EXAM: PORTABLE CHEST 1 VIEW COMPARISON:  Portable chest x-ray of April 22, 2016 FINDINGS: The lungs are well-expanded. The pulmonary interstitial markings are less prominent. There remains central pulmonary vascular prominence. There is no significant pleural effusion. The heart is normal in size. The endotracheal tube tip lies 2 point 5 cm  above the carina. The esophagogastric tube tip in proximal port project below the inferior margin of the image. The right-sided PICC line tip projects over the right cavoatrial junction. IMPRESSION: Improving pulmonary interstitial edema. No discrete infiltrate. Normal cardiac size. The support tubes are in reasonable position. Electronically Signed   By: David  SwazilandJordan M.D.   On: 04/23/2016 07:59    Assessment/Plan : Acute hypoxemic respiratory failure 2/2 unable to protect the airway 2/2 encephalopathy + stridor on extubation 3/14 + asp pna/pneumonitis - cont vent support - likely needs a trache - main barrier to  extubation is mental status. Need to adjust sedation meds - cont Vanc for now >> plan for 7 days - will give decadron x 24 hrs for stridor  Encephalopathy 2/2 Opiate Overdose  - work up U/R as far as EEG and MRI are concerned but pt remains encephalopathic, non purposeful - on fentanyl drip + propofol drip - start clonazepam 0.5 mg BID + seroquel 25 mg BID - will resume home dose prozac  AKI 2/2 Rhabdomyolysis - cont IVF  Best Practice - lovenox for DVT prophylaxi - cont PPI   I spent  30  minutes of Critical Care time with this patient today. This is my time spent independent of the APP or resident.   Family :Family updated at length today.  Father updated at bedside   J. Alexis FrockAngelo A de Dios, MD 04/24/2016, 9:31 AM [facility] Pager [phone] After 3 pm or if no answer, call [phone]

## 2016-04-25 ENCOUNTER — Inpatient Hospital Stay (HOSPITAL_COMMUNITY): Payer: 59

## 2016-04-25 DIAGNOSIS — J15212 Pneumonia due to Methicillin resistant Staphylococcus aureus: Secondary | ICD-10-CM

## 2016-04-25 DIAGNOSIS — J810 Acute pulmonary edema: Secondary | ICD-10-CM

## 2016-04-25 LAB — POCT I-STAT 3, ART BLOOD GAS (G3+)
Acid-Base Excess: 1 mmol/L (ref 0.0–2.0)
BICARBONATE: 26.7 mmol/L (ref 20.0–28.0)
O2 Saturation: 100 %
PCO2 ART: 47.2 mmHg (ref 32.0–48.0)
Patient temperature: 100
TCO2: 28 mmol/L (ref 0–100)
pH, Arterial: 7.364 (ref 7.350–7.450)
pO2, Arterial: 371 mmHg — ABNORMAL HIGH (ref 83.0–108.0)

## 2016-04-25 LAB — CK TOTAL AND CKMB (NOT AT ARMC)
CK TOTAL: 168 U/L (ref 38–234)
CK, MB: 2.4 ng/mL (ref 0.5–5.0)
RELATIVE INDEX: 1.4 (ref 0.0–2.5)

## 2016-04-25 LAB — CULTURE, BLOOD (ROUTINE X 2)
CULTURE: NO GROWTH
Culture: NO GROWTH

## 2016-04-25 LAB — BASIC METABOLIC PANEL
ANION GAP: 4 — AB (ref 5–15)
BUN: 8 mg/dL (ref 6–20)
CO2: 25 mmol/L (ref 22–32)
Calcium: 8.1 mg/dL — ABNORMAL LOW (ref 8.9–10.3)
Chloride: 112 mmol/L — ABNORMAL HIGH (ref 101–111)
Creatinine, Ser: 0.36 mg/dL — ABNORMAL LOW (ref 0.44–1.00)
GFR calc non Af Amer: >60 mL/min (ref >60)
Glucose, Bld: 145 mg/dL — ABNORMAL HIGH (ref 65–99)
POTASSIUM: 3.9 mmol/L (ref 3.5–5.1)
Sodium: 141 mmol/L (ref 135–145)

## 2016-04-25 LAB — HEPATIC FUNCTION PANEL
ALT: 36 U/L (ref 14–54)
AST: 25 U/L (ref 15–41)
Albumin: 2.6 g/dL — ABNORMAL LOW (ref 3.5–5.0)
Alkaline Phosphatase: 33 U/L — ABNORMAL LOW (ref 38–126)
BILIRUBIN INDIRECT: 0.3 mg/dL (ref 0.3–0.9)
Bilirubin, Direct: 0.1 mg/dL (ref 0.1–0.5)
TOTAL PROTEIN: 5.4 g/dL — AB (ref 6.5–8.1)
Total Bilirubin: 0.4 mg/dL (ref 0.3–1.2)

## 2016-04-25 LAB — CBC
HEMATOCRIT: 31.2 % — AB (ref 36.0–46.0)
HEMOGLOBIN: 10.7 g/dL — AB (ref 12.0–15.0)
MCH: 30.6 pg (ref 26.0–34.0)
MCHC: 34.3 g/dL (ref 30.0–36.0)
MCV: 89.1 fL (ref 78.0–100.0)
Platelets: 280 10*3/uL (ref 150–400)
RBC: 3.5 MIL/uL — ABNORMAL LOW (ref 3.87–5.11)
RDW: 13.2 % (ref 11.5–15.5)
WBC: 7.8 10*3/uL (ref 4.0–10.5)

## 2016-04-25 LAB — GLUCOSE, CAPILLARY
GLUCOSE-CAPILLARY: 104 mg/dL — AB (ref 65–99)
GLUCOSE-CAPILLARY: 111 mg/dL — AB (ref 65–99)
GLUCOSE-CAPILLARY: 116 mg/dL — AB (ref 65–99)
Glucose-Capillary: 154 mg/dL — ABNORMAL HIGH (ref 65–99)
Glucose-Capillary: 138 mg/dL — ABNORMAL HIGH (ref 65–99)
Glucose-Capillary: 140 mg/dL — ABNORMAL HIGH (ref 65–99)

## 2016-04-25 LAB — MAGNESIUM: Magnesium: 2.2 mg/dL (ref 1.7–2.4)

## 2016-04-25 LAB — PHOSPHORUS: PHOSPHORUS: 3.5 mg/dL (ref 2.5–4.6)

## 2016-04-25 MED ORDER — CLONAZEPAM 0.5 MG PO TBDP
1.0000 mg | ORAL_TABLET | Freq: Two times a day (BID) | ORAL | Status: DC
  Administered 2016-04-25: 1 mg via ORAL
  Filled 2016-04-25: qty 2

## 2016-04-25 MED ORDER — MIDAZOLAM HCL 2 MG/2ML IJ SOLN
INTRAMUSCULAR | Status: AC
  Administered 2016-04-25: 2 mg via INTRAVENOUS
  Filled 2016-04-25: qty 2

## 2016-04-25 MED ORDER — MIDAZOLAM HCL 2 MG/2ML IJ SOLN
2.0000 mg | INTRAMUSCULAR | Status: DC | PRN
  Administered 2016-04-25 – 2016-04-30 (×16): 2 mg via INTRAVENOUS
  Filled 2016-04-25 (×15): qty 2

## 2016-04-25 MED ORDER — FUROSEMIDE 10 MG/ML IJ SOLN
40.0000 mg | Freq: Every day | INTRAMUSCULAR | Status: DC
  Administered 2016-04-25: 40 mg via INTRAVENOUS
  Filled 2016-04-25: qty 4

## 2016-04-25 MED ORDER — MIDAZOLAM HCL 2 MG/2ML IJ SOLN
2.0000 mg | INTRAMUSCULAR | Status: DC | PRN
  Administered 2016-04-26: 2 mg via INTRAVENOUS
  Filled 2016-04-25 (×2): qty 2

## 2016-04-25 MED ORDER — MIDAZOLAM HCL 2 MG/2ML IJ SOLN
2.0000 mg | Freq: Once | INTRAMUSCULAR | Status: AC
  Administered 2016-04-25: 2 mg via INTRAVENOUS

## 2016-04-25 NOTE — Progress Notes (Signed)
PULMONARY / CRITICAL CARE MEDICINE   Name: Jane Watkins MRN: [id number] DOB: 1995/07/13    ADMISSION DATE:  04/19/2016  CHIEF COMPLAINT:  Unresponsive  BRIEF SUMMARY:  21 y/o F with a history of opiate abuse who was found next to her boyfriend (who was deceased). She was unresponsive. She was brought to [facility] where she was found to combative and was intubated for airway protection, concern for aspiration. Her CK was 15k, and had a mild lactic acidosis. She was brought to [facility] for further evaluation.  SUBJECTIVE:  Continues to require high dose sedation. Fio2 100% will check abg.  VITAL SIGNS: BP (!) 103/58   Pulse 67   Temp 100 F (37.8 C) (Axillary)   Resp (!) 22   Ht 5\' 4"  (1.626 m)   Wt 60.1 kg (132 lb 7.9 oz)   SpO2 98%   BMI 22.74 kg/m   HEMODYNAMICS:    VENTILATOR SETTINGS: Vent Mode: PRVC FiO2 (%):  [40 %-100 %] 100 % Set Rate:  [22 bmp] 22 bmp Vt Set:  [450 mL] 450 mL PEEP:  [5 cmH20-8 cmH20] 8 cmH20 Plateau Pressure:  [20 cmH20-25 cmH20] 22 cmH20  INTAKE / OUTPUT: I/O last 3 completed shifts: In: 7367.5 [I.V.:3544.6; NG/GT:1871.3; IV Piggyback:1951.6] Out: 5900 [Urine:5900]  PHYSICAL EXAMINATION: General: young adult female in NAD on vent HEENT: MM pink/moist, ETT Neuro: Sedated or writhing in bed CV: s1s2 rrr, no m/r/g PULM: even/non-labored, lungs bilaterally clear WU:JWJX, non-tender, bsx4 active  Extremities: warm/dry, no edema  Skin: no rashes or lesions    LABS:  BMET  Recent Labs Lab 04/23/16 0535 04/24/16 0254 04/25/16 0505  NA 142 139 141  K 3.5 3.5 3.9  CL 112* 113* 112*  CO2 19* 19* 25  BUN <5* <5* 8  CREATININE 0.51 0.53 0.36*  GLUCOSE 74 132* 145*   Electrolytes  Recent Labs Lab 04/23/16 0535  04/24/16 0254 04/24/16 1734 04/25/16 0505  CALCIUM 8.0*  --  8.2*  --  8.1*  MG 1.9  < > 1.5* 1.8 2.2  PHOS  --   < > 3.5 3.8 3.5  < > = values in this interval not displayed. CBC  Recent Labs Lab 04/23/16 0535  04/24/16 0254 04/25/16 0505  WBC 7.6 8.5 7.8  HGB 10.0* 10.2* 10.7*  HCT 30.5* 29.9* 31.2*  PLT 179 197 280   Coag's No results for input(s): APTT, INR in the last 168 hours.  Sepsis Markers  Recent Labs Lab 04/19/16 1742 04/19/16 2325 04/20/16 0450  LATICACIDVEN 3.4* 1.0 0.8   ABG  Recent Labs Lab 04/20/16 1750 04/21/16 0340 04/23/16 1756  PHART 7.438 7.456* 7.299*  PCO2ART 34.8 35.0 38.3  PO2ART 191* 120* 136.0*   Liver Enzymes  Recent Labs Lab 04/21/16 0235 04/22/16 0334 04/25/16 0505  AST 66* 55* 25  ALT 68* 60* 36  ALKPHOS 42 42 33*  BILITOT 1.5* 1.5* 0.4  ALBUMIN 2.7* 3.0* 2.6*   Cardiac Enzymes  Recent Labs Lab 04/20/16 0450 04/20/16 1149 04/22/16 1203  TROPONINI 2.94* 2.34* 0.51*   Glucose  Recent Labs Lab 04/24/16 1159 04/24/16 1528 04/24/16 1938 04/24/16 2304 04/25/16 0331 04/25/16 0755  GLUCAP 121* 140* 150* 137* 154* 138*   Imaging Dg Chest Port 1 View  Result Date: 04/25/2016 CLINICAL DATA:  Respiratory failure. EXAM: PORTABLE CHEST 1 VIEW COMPARISON:  04/24/2016. FINDINGS: Endotracheal tube, NG tube, right PICC line in stable position. Heart size normal. Diffuse bilateral pulmonary infiltrates and or edema noted on today's exam.  Small left pleural effusion. No pneumothorax . IMPRESSION: 1. Lines and tubes in stable position. 2. Diffuse mild bilateral pulmonary infiltrates and or edema noted on today's exam. Small left pleural effusion. Electronically Signed   By: Maisie Fus  Register   On: 04/25/2016 07:31   Dg Chest Port 1 View  Result Date: 04/24/2016 CLINICAL DATA:  Acute respiratory failure with hypoxia EXAM: PORTABLE CHEST 1 VIEW COMPARISON:  04/24/2016 FINDINGS: Endotracheal tube tip is approximately 1.5 cm superior to the Watkins. Esophageal tube tip is below the diaphragm but is not included. The right lung is grossly clear. There is slight increased consolidation at the left lung base. Heart size stable. No pneumothorax.  IMPRESSION: Slight increased consolidation at the left lung base. Otherwise no significant interval change. Electronically Signed   By: Jasmine Pang M.D.   On: 04/24/2016 19:05    STUDIES:  Head CT 3/10 >> Normal ECHO 3/11 >> LVEF 65-70%, no RWMA, no evidence of vegetation Continuous EEG 3/13 >> MRI 3/13 >> unremarkable appearance of the brain  CULTURES: Sputum 3/11 >> MRSA  BCx2 3/11 >>  UC 3/10 >> greater 100k coag neg staph >> sens vanco  ANTIBIOTICS: Unasyn 3/11 >> 3/14 Vancomycin 3/11 >>   SIGNIFICANT EVENTS: 3/10  Admit after overdose 3/13  Neurology consulted, continuous EEG initiated 3/15 wild   3/16 not as agitated  LINES/TUBES: ETT 3/10 >> 3/14 ett 3/14(FAILED EXTUBATION)>>  DISCUSSION: 21 y/o p/w AMS and intoxication following apparent opiate O/D.  Person was deceased at her side.   ASSESSMENT / PLAN:  PULMONARY A: Need for mechanical ventilation(extubated 3/14 and developed stridor and reintubated) Acute Respiratory failure, stridor post extubation RLL Aspiration PNA - mild airspace disease Tobacco Abuse  3/16 increased FIO2 needs P:   Continue vent support  PRVC 8 cc/kg  Wean PEEP / FiO2 for sats > 92% Intermittent CXR Avoid periods of hypoxemia  PRN bronchodilators PSV as tolerated 3/16 check abg to confirm FIO2 needs  CARDIOVASCULAR A:  Troponinemia - likely demand ischemia P:  ICU monitoring  Troponin cleared   RENAL  Recent Labs Lab 04/23/16 0535 04/24/16 0254 04/25/16 0505  K 3.5 3.5 3.9    Recent Labs Lab 04/23/16 0535 04/24/16 0254 04/25/16 0505  NA 142 139 141     A:   Rhabdomyolysis(improving) resolved 3/16 ck 168 Lactic acidosis->resolved Hypokalemia  P:   LR at 50 ml/hr  Trend BMP / urinary output Replace electrolytes as indicated Avoid nephrotoxic agents, ensure adequate renal perfusion Trend CK > cleared  GASTROINTESTINAL A:   No active issues P:   Begin TF  NPO / OGT PPI for SUP  HEMATOLOGIC A:    No active issues P:  Lovenox for DVT prophylaxis   INFECTIOUS A:   Suspected Aspiration - minimal airspace disease on CXR MRSA in Sputum  Coag Neg Staph UTI P:   Trend PCT Follow cultures  Discontinue unasyn Vanco D6/x  ENDOCRINE A:   No active issues   P:   Trend glucose on BMP  NEUROLOGIC A:   Opiate Overdose  Acute Encephalopathy  Rule Out Anoxic Injury, not evident on head MRI 3/13 P:   RASS Goal: 0 to -1  precedex   propofol  Fentanyl gtt for pain Appreciate Neurology input  Continuous EEG in process  Thiamine/folic acid Low dose Klonopin and Seroquel started 3/15   FAMILY  - Updates:  Father and Step-mother updated at bedside 3/14.  No family at bedside 3/15 at time of exam. Father  updated 3/16.  - Inter-disciplinary family meet or Palliative Care meeting due by:  04/26/16   NP CC Time: 30 minutes  Brett CanalesSteve Minor ACNP [facility] Pager [phone] till 3 pm If no answer page [phone] 04/25/2016, 9:39 AM   ATTENDING NOTE / ATTESTATION NOTE :   I have discussed the case with the resident/APP  Brett CanalesSteve Minor NP.   I agree with the resident/APP's  history, physical examination, assessment, and plans.    I have edited the above note and modified it according to our agreed history, physical examination, assessment and plan.   Briefly, 21 y/o F with a history of opiate abuse who was found next to a deceased man  (? dealer). She was unresponsive. [facility] was [facility] where she was found to combative and was intubated for airway protection, concern for aspiration. Her CK was 15k, and had a mild lactic acidosis. [facility].  Patient was extubated on 3/14 but had to be re-intubated for stridor.  Gets intermittently agitated and remains encephalopathic.    On PST, she was tachypneic and had alar flaring. FiO2 is better with ABG reassuring. CXR with pulm edema.   Vitals:  Vitals:   04/25/16 0700 04/25/16 0800 04/25/16  0943 04/25/16 1000  BP: (!) 105/58 (!) 103/58 111/60 (!) 109/54  Pulse: 68 67  92  Resp: (!) 22 (!) 22  20  Temp:  100 F (37.8 C)    TempSrc:  Axillary    SpO2: 100% 98%  100%  Weight:      Height:        Constitutional/General: chronically ill,  intubated, sedated, in mild resp distress during PST   Body mass index is 22.74 kg/m. Wt Readings from Last 3 Encounters:  04/25/16 60.1 kg (132 lb 7.9 oz)  02/02/16 52.2 kg (115 lb)  12/16/15 52.2 kg (115 lb)    HEENT: PERLA, anicteric sclerae. (-) Oral thrush. Intubated, ETT in place  Neck: No masses. Midline trachea. No JVD, (-) LAD. (-) bruits appreciated.  Respiratory/Chest: Grossly normal chest. (-) deformity. (-) Accessory muscle use.  Symmetric expansion. Diminished BS on both lower lung zones. (-) wheezing,  (+) rhonchi in BULF (none heard on 3/15) Crackles at the bases (-) egophony  Cardiovascular: Regular rate and  rhythm, heart sounds normal, no murmur or gallops,  Trace peripheral edema  Gastrointestinal:  Normal bowel sounds. Soft, non-tender. No hepatosplenomegaly.  (-) masses.   Musculoskeletal:  Normal muscle tone.   Extremities: Grossly normal. (-) clubbing, cyanosis.  (-) edema  Skin: (-) rash,lesions seen.   Neurological/Psychiatric : sedated, intubated. CN grossly intact. (-) lateralizing signs.     CBC Recent Labs     04/23/16  0535  04/24/16  0254  04/25/16  0505  WBC  7.6  8.5  7.8  HGB  10.0*  10.2*  10.7*  HCT  30.5*  29.9*  31.2*  PLT  179  197  280    Coag's No results for input(s): APTT, INR in the last 72 hours.  BMET Recent Labs     04/23/16  0535  04/24/16  0254  04/25/16  0505  NA  142  139  141  K  3.5  3.5  3.9  CL  112*  113*  112*  CO2  19*  19*  25  BUN  <5*  <5*  8  CREATININE  0.51  0.53  0.36*  GLUCOSE  74  132*  145*    Electrolytes Recent Labs     04/23/16  0535   04/24/16  0254  04/24/16  1734  04/25/16  0505  CALCIUM  8.0*   --   8.2*   --    8.1*  MG  1.9   < >  1.5*  1.8  2.2  PHOS   --    < >  3.5  3.8  3.5   < > = values in this interval not displayed.    Sepsis Markers No results for input(s): PROCALCITON, O2SATVEN in the last 72 hours.  Invalid input(s): LACTICACIDVEN  ABG Recent Labs     04/23/16  1756  04/25/16  0949  PHART  7.299*  7.364  PCO2ART  38.3  47.2  PO2ART  136.0*  371.0*    Liver Enzymes Recent Labs     04/25/16  0505  AST  25  ALT  36  ALKPHOS  33*  BILITOT  0.4  ALBUMIN  2.6*    Cardiac Enzymes Recent Labs     04/22/16  1203  TROPONINI  0.51*    Glucose Recent Labs     04/24/16  1528  04/24/16  1938  04/24/16  2304  04/25/16  0331  04/25/16  0755  04/25/16  1123  GLUCAP  140*  150*  137*  154*  138*  140*    Imaging Dg Chest Port 1 View  Result Date: 04/25/2016 CLINICAL DATA:  Respiratory failure. EXAM: PORTABLE CHEST 1 VIEW COMPARISON:  04/24/2016. FINDINGS: Endotracheal tube, NG tube, right PICC line in stable position. Heart size normal. Diffuse bilateral pulmonary infiltrates and or edema noted on today's exam. Small left pleural effusion. No pneumothorax . IMPRESSION: 1. Lines and tubes in stable position. 2. Diffuse mild bilateral pulmonary infiltrates and or edema noted on today's exam. Small left pleural effusion. Electronically Signed   By: Maisie Fus  Register   On: 04/25/2016 07:31   Dg Chest Port 1 View  Result Date: 04/24/2016 CLINICAL DATA:  Acute respiratory failure with hypoxia EXAM: PORTABLE CHEST 1 VIEW COMPARISON:  04/24/2016 FINDINGS: Endotracheal tube tip is approximately 1.5 cm superior to the Watkins. Esophageal tube tip is below the diaphragm but is not included. The right lung is grossly clear. There is slight increased consolidation at the left lung base. Heart size stable. No pneumothorax. IMPRESSION: Slight increased consolidation at the left lung base. Otherwise no significant interval change. Electronically Signed   By: Jasmine Pang M.D.   On:  04/24/2016 19:05   Dg Chest Port 1 View  Result Date: 04/24/2016 CLINICAL DATA:  Acute respiratory failure EXAM: PORTABLE CHEST 1 VIEW COMPARISON:  Yesterday FINDINGS: Endotracheal tube tip 11 mm above the Watkins. An orogastric tube reaches stomach at least. Improved aeration at the bases with better defined diaphragm. No edema, visible effusion, or pneumothorax. Normal heart size. IMPRESSION: 1. Endotracheal tube tip 11 mm above the Watkins. 2. Improved aeration compared to yesterday. Electronically Signed   By: Marnee Spring M.D.   On: 04/24/2016 07:22   Dg Chest Port 1 View  Result Date: 04/23/2016 CLINICAL DATA:  Intubation EXAM: PORTABLE CHEST 1 VIEW COMPARISON:  Radiograph 04/23/2016 FINDINGS: Endotracheal tube and PICC line are unchanged. Normal cardiac silhouette. New moderate bilateral pleural effusions. NG tube extends into the stomach. No pneumothorax. IMPRESSION: 1. Stable support apparatus. 2. New bilateral pleural effusions. These results will be called to the ordering clinician or representative by the Radiologist Assistant, and communication documented  in the PACS or zVision Dashboard. Electronically Signed   By: Genevive Bi M.D.   On: 04/23/2016 16:30   Assessment/Plan : Acute hypoxemic respiratory failure 2/2 unable to protect the airway 2/2 encephalopathy + stridor on extubation 3/14 + asp pna/pneumonitis + pulm edema on 3/16 - cont vent support - main barrier to extubation is mental status as she remains encephalopathic. See below.  - may need a trache - there is a component of pulm edema on 3/16 2/2 hydration related to rhabdo. Will diuresce.  - cont Vanc for now >> plan for 7 days - S/P decadron for 24 hrs to rx stridor.    Encephalopathy 2/2 Opiate Overdose  - work up U/R as far as EEG and MRI are concerned but pt remains encephalopathic, non purposeful. I think this is all related to opiate overdose/withdrawal/mental health issues/trauma (?PTSD) - on fentanyl drip  + propofol drip >> try to wean off propofol drip - we tried versed push and she liked it.  - will inc clonazepam from 0.5 mg BID to 1 mg BID on 3/16 and keep on versed prn.  - cont seroquel 25 mg BID - cont prozac  AKI 2/2 Rhabdomyolysis. Improved - will d/c IVF and start lasix 2/2 pulm edema   Best Practice - lovenox for DVT prophylaxi - cont PPI   I spent  30  minutes of Critical Care time with this patient today. This is my time spent independent of the APP or resident.   Family :Family updated at length today.  Father updated at bedside    J. Alexis Frock, MD 04/25/2016, 11:30 AM [place] [facility] Pager [phone] After 3 pm or if no answer, call [phone]

## 2016-04-25 NOTE — Progress Notes (Signed)
eLink Physician-Brief Progress Note Patient Name: Jane CarinaLydia Watkins DOB: 09/17/1995 MRN: [id number]   Date of Service  04/25/2016  HPI/Events of Note  Agitation - Unable to wean Propofol in spite of high dose Fentanyl IV infusion and Versed 2 mg IV Q 2 hours PRN.   eICU Interventions  Will order: 1. Increase ceiling on Propofol IV infusion to 60 mcg/kg/min.      Intervention Category Minor Interventions: Agitation / anxiety - evaluation and management  Lenell AntuSommer,Steven Eugene 04/25/2016, 5:33 PM

## 2016-04-26 ENCOUNTER — Inpatient Hospital Stay (HOSPITAL_COMMUNITY): Payer: 59

## 2016-04-26 DIAGNOSIS — R451 Restlessness and agitation: Secondary | ICD-10-CM

## 2016-04-26 LAB — GLUCOSE, CAPILLARY
GLUCOSE-CAPILLARY: 118 mg/dL — AB (ref 65–99)
GLUCOSE-CAPILLARY: 93 mg/dL (ref 65–99)
Glucose-Capillary: 111 mg/dL — ABNORMAL HIGH (ref 65–99)
Glucose-Capillary: 100 mg/dL — ABNORMAL HIGH (ref 65–99)
Glucose-Capillary: 100 mg/dL — ABNORMAL HIGH (ref 65–99)
Glucose-Capillary: 110 mg/dL — ABNORMAL HIGH (ref 65–99)

## 2016-04-26 LAB — BASIC METABOLIC PANEL
ANION GAP: 4 — AB (ref 5–15)
BUN: 11 mg/dL (ref 6–20)
CHLORIDE: 112 mmol/L — AB (ref 101–111)
CO2: 25 mmol/L (ref 22–32)
Calcium: 8.5 mg/dL — ABNORMAL LOW (ref 8.9–10.3)
Creatinine, Ser: 0.33 mg/dL — ABNORMAL LOW (ref 0.44–1.00)
GFR calc non Af Amer: >60 mL/min (ref >60)
GLUCOSE: 127 mg/dL — AB (ref 65–99)
Potassium: 3.7 mmol/L (ref 3.5–5.1)
Sodium: 141 mmol/L (ref 135–145)

## 2016-04-26 LAB — BLOOD GAS, ARTERIAL
ACID-BASE DEFICIT: 0 mmol/L (ref 0.0–2.0)
Bicarbonate: 24.4 mmol/L (ref 20.0–28.0)
Drawn by: 23604
FIO2: 30
O2 SAT: 95.2 %
PEEP/CPAP: 5 cmH2O
PH ART: 7.383 (ref 7.350–7.450)
Patient temperature: 98.6
RATE: 22 resp/min
VT: 450 mL
pCO2 arterial: 41.9 mmHg (ref 32.0–48.0)
pO2, Arterial: 80.8 mmHg — ABNORMAL LOW (ref 83.0–108.0)

## 2016-04-26 LAB — CBC
HCT: 31.5 % — ABNORMAL LOW (ref 36.0–46.0)
Hemoglobin: 10.5 g/dL — ABNORMAL LOW (ref 12.0–15.0)
MCH: 30.1 pg (ref 26.0–34.0)
MCHC: 33.3 g/dL (ref 30.0–36.0)
MCV: 90.3 fL (ref 78.0–100.0)
PLATELETS: 290 10*3/uL (ref 150–400)
RBC: 3.49 MIL/uL — AB (ref 3.87–5.11)
RDW: 13.5 % (ref 11.5–15.5)
WBC: 7 10*3/uL (ref 4.0–10.5)

## 2016-04-26 LAB — MAGNESIUM: Magnesium: 2 mg/dL (ref 1.7–2.4)

## 2016-04-26 LAB — VANCOMYCIN, TROUGH: Vancomycin Tr: 18 ug/mL (ref 15–20)

## 2016-04-26 LAB — PHOSPHORUS: PHOSPHORUS: 3.7 mg/dL (ref 2.5–4.6)

## 2016-04-26 MED ORDER — CLONAZEPAM 0.5 MG PO TBDP
2.0000 mg | ORAL_TABLET | Freq: Two times a day (BID) | ORAL | Status: DC
  Administered 2016-04-26 – 2016-05-01 (×8): 2 mg via ORAL
  Filled 2016-04-26 (×9): qty 4

## 2016-04-26 MED ORDER — DEXMEDETOMIDINE HCL IN NACL 200 MCG/50ML IV SOLN
0.4000 ug/kg/h | INTRAVENOUS | Status: DC
  Administered 2016-04-26: 0.4 ug/kg/h via INTRAVENOUS
  Administered 2016-04-26: 0.7 ug/kg/h via INTRAVENOUS
  Administered 2016-04-26: 0.4 ug/kg/h via INTRAVENOUS
  Administered 2016-04-27: 0.7 ug/kg/h via INTRAVENOUS
  Administered 2016-04-27: 0.6 ug/kg/h via INTRAVENOUS
  Administered 2016-04-28 (×3): 0.7 ug/kg/h via INTRAVENOUS
  Filled 2016-04-26 (×10): qty 50

## 2016-04-26 NOTE — Progress Notes (Signed)
PULMONARY / CRITICAL CARE MEDICINE   Name: Jane Watkins MRN: [id number] DOB: 16-Mar-1995    ADMISSION DATE:  04/19/2016  CHIEF COMPLAINT:  Unresponsive  BRIEF SUMMARY:  21 y/o F with a history of opiate abuse who was found next to her boyfriend (who was deceased). She was unresponsive. She was brought to [facility] where she was found to combative and was intubated for airway protection, concern for aspiration. Her CK was 15k, and had a mild lactic acidosis. She was brought to [facility] for further evaluation.  SUBJECTIVE:  Continues to require high dose sedation. Fio2 100% will check abg.  VITAL SIGNS: BP 108/72   Pulse 68   Temp 98.9 F (37.2 C) (Axillary)   Resp (!) 22   Ht 5\' 4"  (1.626 m)   Wt 56.6 kg (124 lb 12.5 oz)   SpO2 100%   BMI 21.42 kg/m   HEMODYNAMICS:    VENTILATOR SETTINGS: Vent Mode: PRVC FiO2 (%):  [30 %-100 %] 30 % Set Rate:  [22 bmp] 22 bmp Vt Set:  [450 mL] 450 mL PEEP:  [5 cmH20] 5 cmH20 Plateau Pressure:  [17 cmH20-25 cmH20] 21 cmH20  INTAKE / OUTPUT: I/O last 3 completed shifts: In: 7969.5 [I.V.:3237.5; NG/GT:2131.3; IV Piggyback:2600.8] Out: 7100 [Urine:7100]  PHYSICAL EXAMINATION: General: young adult female in NAD on vent, sedate HEENT: MM pink/moist, ETT Neuro: Sedated, moving all ext spontaneously CV: s1s2 rrr, no m/r/g PULM: even/non-labored, lungs bilaterally clear JX:BJYN, non-tender, bsx4 active  Extremities: warm/dry, no edema  Skin: no rashes or lesions  LABS:  BMET  Recent Labs Lab 04/24/16 0254 04/25/16 0505 04/26/16 0500  NA 139 141 141  K 3.5 3.9 3.7  CL 113* 112* 112*  CO2 19* 25 25  BUN <5* 8 11  CREATININE 0.53 0.36* 0.33*  GLUCOSE 132* 145* 127*   Electrolytes  Recent Labs Lab 04/24/16 0254 04/24/16 1734 04/25/16 0505 04/26/16 0500  CALCIUM 8.2*  --  8.1* 8.5*  MG 1.5* 1.8 2.2 2.0  PHOS 3.5 3.8 3.5 3.7   CBC  Recent Labs Lab 04/24/16 0254 04/25/16 0505 04/26/16 0500  WBC 8.5 7.8 7.0  HGB 10.2*  10.7* 10.5*  HCT 29.9* 31.2* 31.5*  PLT 197 280 290   Coag's No results for input(s): APTT, INR in the last 168 hours.  Sepsis Markers  Recent Labs Lab 04/19/16 1742 04/19/16 2325 04/20/16 0450  LATICACIDVEN 3.4* 1.0 0.8   ABG  Recent Labs Lab 04/23/16 1756 04/25/16 0949 04/26/16 0406  PHART 7.299* 7.364 7.383  PCO2ART 38.3 47.2 41.9  PO2ART 136.0* 371.0* 80.8*   Liver Enzymes  Recent Labs Lab 04/21/16 0235 04/22/16 0334 04/25/16 0505  AST 66* 55* 25  ALT 68* 60* 36  ALKPHOS 42 42 33*  BILITOT 1.5* 1.5* 0.4  ALBUMIN 2.7* 3.0* 2.6*   Cardiac Enzymes  Recent Labs Lab 04/20/16 0450 04/20/16 1149 04/22/16 1203  TROPONINI 2.94* 2.34* 0.51*   Glucose  Recent Labs Lab 04/25/16 1123 04/25/16 1545 04/25/16 2038 04/25/16 2332 04/26/16 0319 04/26/16 0742  GLUCAP 140* 116* 104* 111* 111* 100*   Imaging Dg Chest Port 1 View  Result Date: 04/26/2016 CLINICAL DATA:  Respiratory failure. EXAM: PORTABLE CHEST 1 VIEW COMPARISON:  April 25, 2016 FINDINGS: The ETT terminates 16 mm above the Watkins. Recommend withdrawing 1 cm. Right PICC line is in good position. No pneumothorax. The cardiomediastinal silhouette is normal. Mild increased interstitial markings on the right greater than left, particularly the medial lower lobe may be positional versusa  an early developing infiltrate. IMPRESSION: 1. The ETT is low lying, 16 mm above the Watkins. Recommend withdrawing 1 cm. Right PICC line in good position. 2. Increased interstitial markings in the right lung versus the left, particularly in the medial right lower lobe could be due to positioning or subtle developing infiltrate. Recommend attention on follow-up. These results will be called to the ordering clinician or representative by the Radiologist Assistant, and communication documented in the PACS or zVision Dashboard. Electronically Signed   By: Gerome Sam III M.D   On: 04/26/2016 07:58    STUDIES:  Head CT 3/10  >> Normal ECHO 3/11 >> LVEF 65-70%, no RWMA, no evidence of vegetation Continuous EEG 3/13 >> MRI 3/13 >> unremarkable appearance of the brain  CULTURES: Sputum 3/11 >> MRSA  BCx2 3/11 >>  UC 3/10 >> greater 100k coag neg staph >> sens vanco  ANTIBIOTICS: Unasyn 3/11 >> 3/14 Vancomycin 3/11 >>   SIGNIFICANT EVENTS: 3/10  Admit after overdose 3/13  Neurology consulted, continuous EEG initiated 3/15 wild   3/16 not as agitated  LINES/TUBES: ETT 3/10 >> 3/14 ett 3/14(FAILED EXTUBATION)>>  DISCUSSION: 21 y/o p/w AMS and intoxication following apparent opiate O/D.  Person was deceased at her side.   ASSESSMENT / PLAN:  PULMONARY A: Need for mechanical ventilation(extubated 3/14 and developed stridor and reintubated) Acute Respiratory failure, stridor post extubation RLL Aspiration PNA - mild airspace disease Tobacco Abuse  3/16 increased FIO2 needs P:   Begin PS trials but no extubation given mental status, anticipate will need trach next week. Intermittent CXR Avoid periods of hypoxemia  PRN bronchodilators  CARDIOVASCULAR A:  Troponinemia - likely demand ischemia P:  ICU monitoring  Troponin cleared   RENAL  Recent Labs Lab 04/24/16 0254 04/25/16 0505 04/26/16 0500  K 3.5 3.9 3.7    Recent Labs Lab 04/24/16 0254 04/25/16 0505 04/26/16 0500  NA 139 141 141   A:   Rhabdomyolysis(improving) resolved 3/16 ck 168 Lactic acidosis->resolved Hypokalemia  P:   KVO IVF Trend BMP / urinary output Replace electrolytes as indicated Avoid nephrotoxic agents, ensure adequate renal perfusion Trend CK > cleared D/C daily lasix  GASTROINTESTINAL A:   No active issues P:   TF  NPO / OGT PPI for SUP  HEMATOLOGIC A:   No active issues P:  Lovenox for DVT prophylaxis   INFECTIOUS A:   Suspected Aspiration - minimal airspace disease on CXR MRSA in Sputum  Coag Neg Staph UTI P:   Trend PCT Follow cultures  Discontinue unasyn Vanco  D6/x  ENDOCRINE A:   No active issues   P:   Trend glucose on BMP  NEUROLOGIC A:   Opiate Overdose  Acute Encephalopathy  Rule Out Anoxic Injury, not evident on head MRI 3/13 P:   RASS Goal: 0 to -1  Precedex will restart Propofol down to 35 mcg Fentanyl gtt for pain Appreciate Neurology input  Thiamine/folic acid Low dose Klonopin and Seroquel started 3/15,  Increase Klonopin to 2 mg PO BID  FAMILY  - Updates:  No family bedside  - Inter-disciplinary family meet or Palliative Care meeting due by:  04/26/16  The patient is critically ill with multiple organ systems failure and requires high complexity decision making for assessment and support, frequent evaluation and titration of therapies, application of advanced monitoring technologies and extensive interpretation of multiple databases.   Critical Care Time devoted to patient care services described in this note is  35  Minutes. This time  reflects time of care of this signee Dr Koren BoundWesam Yacoub. This critical care time does not reflect procedure time, or teaching time or supervisory time of PA/NP/Med student/Med Resident etc but could involve care discussion time.  Alyson ReedyWesam G. Yacoub, M.D. [facility]. Pager: [phone]. After hours pager: [phone].

## 2016-04-26 NOTE — Progress Notes (Signed)
Pharmacy Antibiotic Note Jane Watkins is a 21 y.o. female admitted on 04/19/2016 that is currently on day 7 of vancomycin for treatment of aspiration and MRSA PNA.   Vancomycin trough of 18 today is within desired range of 15-20 based on indication.  Plan: Continue vancomycin 1750 mg IV every 8 hours  Monitor culture data, renal function and clinical course   Height: 5\' 4"  (162.6 cm) Weight: 124 lb 12.5 oz (56.6 kg) IBW/kg (Calculated) : 54.7  Temp (24hrs), Avg:99 F (37.2 C), Min:98.4 F (36.9 C), Max:99.7 F (37.6 C)   Recent Labs Lab 04/19/16 1742  04/19/16 2325 04/20/16 0450  04/22/16 0334  04/23/16 0535 04/24/16 0254 04/24/16 1343 04/25/16 0505 04/26/16 0500 04/26/16 1030  WBC 21.8*  < >  --   --   < > 12.5*  --  7.6 8.5  --  7.8 7.0  --   CREATININE 0.74  < >  --   --   < > 0.66  --  0.51 0.53  --  0.36* 0.33*  --   LATICACIDVEN 3.4*  --  1.0 0.8  --   --   --   --   --   --   --   --   --   VANCOTROUGH  --   --   --   --   --   --   < >  --   --  9*  --   --  18  < > = values in this interval not displayed.  Estimated Creatinine Clearance: 96.9 mL/min (A) (by C-G formula based on SCr of 0.33 mg/dL (L)).    Allergies  Allergen Reactions  . Other Rash    Tide detergent   . Sulfa Antibiotics Rash    Antimicrobials this admission: Zosyn 3/10 x1 Unasyn 3/11>> Vancomycin 3/11>>  Microbiology results: 3/11 blood cx: NEG 3/11 resp cx: MRSA 3/10 UCx: CoNS 3/10 MRSA PCR: pos  Lysle Pearlachel Tatem Fesler, PharmD, BCPS Pager # [phone] 04/26/2016 12:24 PM

## 2016-04-27 ENCOUNTER — Inpatient Hospital Stay (HOSPITAL_COMMUNITY): Payer: 59

## 2016-04-27 DIAGNOSIS — T50902D Poisoning by unspecified drugs, medicaments and biological substances, intentional self-harm, subsequent encounter: Secondary | ICD-10-CM

## 2016-04-27 LAB — BLOOD GAS, ARTERIAL
ACID-BASE DEFICIT: 0.5 mmol/L (ref 0.0–2.0)
BICARBONATE: 23 mmol/L (ref 20.0–28.0)
Drawn by: 236041
FIO2: 0.3
LHR: 22 {breaths}/min
MECHVT: 450 mL
O2 Saturation: 97.1 %
PATIENT TEMPERATURE: 98.6
PCO2 ART: 33.5 mmHg (ref 32.0–48.0)
PEEP/CPAP: 5 cmH2O
PH ART: 7.451 — AB (ref 7.350–7.450)
PO2 ART: 94.8 mmHg (ref 83.0–108.0)

## 2016-04-27 LAB — BASIC METABOLIC PANEL
Anion gap: 7 (ref 5–15)
BUN: 14 mg/dL (ref 6–20)
CHLORIDE: 107 mmol/L (ref 101–111)
CO2: 25 mmol/L (ref 22–32)
Calcium: 8.2 mg/dL — ABNORMAL LOW (ref 8.9–10.3)
Creatinine, Ser: 0.3 mg/dL — ABNORMAL LOW (ref 0.44–1.00)
GFR calc Af Amer: >60 mL/min (ref >60)
GFR calc non Af Amer: >60 mL/min (ref >60)
GLUCOSE: 98 mg/dL (ref 65–99)
POTASSIUM: 3.3 mmol/L — AB (ref 3.5–5.1)
Sodium: 139 mmol/L (ref 135–145)

## 2016-04-27 LAB — CBC
HCT: 31 % — ABNORMAL LOW (ref 36.0–46.0)
HEMOGLOBIN: 10.5 g/dL — AB (ref 12.0–15.0)
MCH: 30.3 pg (ref 26.0–34.0)
MCHC: 33.9 g/dL (ref 30.0–36.0)
MCV: 89.6 fL (ref 78.0–100.0)
PLATELETS: 273 10*3/uL (ref 150–400)
RBC: 3.46 MIL/uL — AB (ref 3.87–5.11)
RDW: 13.1 % (ref 11.5–15.5)
WBC: 9.2 10*3/uL (ref 4.0–10.5)

## 2016-04-27 LAB — GLUCOSE, CAPILLARY
GLUCOSE-CAPILLARY: 113 mg/dL — AB (ref 65–99)
GLUCOSE-CAPILLARY: 97 mg/dL (ref 65–99)
Glucose-Capillary: 109 mg/dL — ABNORMAL HIGH (ref 65–99)
Glucose-Capillary: 70 mg/dL (ref 65–99)
Glucose-Capillary: 87 mg/dL (ref 65–99)
Glucose-Capillary: 96 mg/dL (ref 65–99)

## 2016-04-27 LAB — TRIGLYCERIDES: Triglycerides: 185 mg/dL — ABNORMAL HIGH (ref <150)

## 2016-04-27 LAB — PHOSPHORUS: Phosphorus: 3.2 mg/dL (ref 2.5–4.6)

## 2016-04-27 LAB — MAGNESIUM: Magnesium: 1.7 mg/dL (ref 1.7–2.4)

## 2016-04-27 MED ORDER — CHLORHEXIDINE GLUCONATE CLOTH 2 % EX PADS
6.0000 | MEDICATED_PAD | Freq: Every day | CUTANEOUS | Status: DC
  Administered 2016-04-28 – 2016-04-29 (×2): 6 via TOPICAL

## 2016-04-27 MED ORDER — POTASSIUM PHOSPHATES 15 MMOLE/5ML IV SOLN
30.0000 mmol | Freq: Once | INTRAVENOUS | Status: AC
  Administered 2016-04-27: 30 mmol via INTRAVENOUS
  Filled 2016-04-27: qty 10

## 2016-04-27 MED ORDER — MAGNESIUM SULFATE 2 GM/50ML IV SOLN
2.0000 g | Freq: Once | INTRAVENOUS | Status: AC
  Administered 2016-04-27: 2 g via INTRAVENOUS
  Filled 2016-04-27: qty 50

## 2016-04-27 MED ORDER — CHLORHEXIDINE GLUCONATE 0.12 % MT SOLN
OROMUCOSAL | Status: AC
  Administered 2016-04-27: 15 mL via OROMUCOSAL
  Filled 2016-04-27: qty 15

## 2016-04-27 MED ORDER — FUROSEMIDE 10 MG/ML IJ SOLN
20.0000 mg | Freq: Three times a day (TID) | INTRAMUSCULAR | Status: AC
  Administered 2016-04-27: 20 mg via INTRAVENOUS
  Filled 2016-04-27 (×2): qty 2

## 2016-04-27 MED ORDER — POTASSIUM CHLORIDE 20 MEQ/15ML (10%) PO SOLN
20.0000 meq | ORAL | Status: AC
  Administered 2016-04-27 (×2): 20 meq
  Filled 2016-04-27 (×2): qty 15

## 2016-04-27 NOTE — Progress Notes (Signed)
[facility] FOR AM LAB REPLACEMENT ONLY  The patient does [facility]ly[facility] Protocol based on the criteria listed below:   1. Is GFR >/= 40 ml/min? Yes.    Patient's GFR today is >60 2. Is urine output >/= 0.5 ml/kg/hr for the last 6 hours? Yes.   Patient's UOP is 1.4 ml/kg/hr 3. Is BUN < 60 mg/dL? Yes.    Patient's BUN today is 14 4. Abnormal electrolyte(s):K 3.3, Mag 1.7 5. Ordered repletion with: protocol 6. If a panic level lab has been reported, has the CCM MD in charge been notified? No..   Physician:    Markus DaftWHELAN, Iysis Germain A 04/27/2016 5:43 AM

## 2016-04-27 NOTE — Progress Notes (Signed)
S: She looks much better today and she is finally able to follow commands.  ZOX:WRUEAVROS:Unable to obtain due to pt is intubated  History reviewed. No pertinent past medical history.  Family History  Problem Relation Age of Onset  . Anemia Father      Social History:  reports that she has been smoking Cigarettes.  She has a 4.00 pack-year smoking history. She has never used smokeless tobacco. She reports that she does not drink alcohol or use drugs.   Exam: Current vital signs: BP 107/64   Pulse 77   Temp 98.2 F (36.8 C) (Axillary)   Resp (!) 22   Ht 5\' 4"  (1.626 m)   Wt 59 kg (130 lb 1.1 oz)   SpO2 95%   BMI 22.33 kg/m  Vital signs in last 24 hours: Temp:  [98.2 F (36.8 C)-99.3 F (37.4 C)] 98.2 F (36.8 C) (03/18 0345) Pulse Rate:  [66-121] 77 (03/18 0730) Resp:  [17-25] 22 (03/18 0730) BP: (99-134)/(56-93) 107/64 (03/18 0730) SpO2:  [90 %-100 %] 95 % (03/18 0730) FiO2 (%):  [30 %] 30 % (03/18 0400) Weight:  [59 kg (130 lb 1.1 oz)] 59 kg (130 lb 1.1 oz) (03/18 0200)   Physical Exam  Constitutional: Appears well-developed and well-nourished.  Psych: Affect appropriate to situation Eyes: No scleral injection HENT: No OP obstrucion Head: Normocephalic.  Cardiovascular: Normal rate and regular rhythm.  Respiratory: Effort normal and breath sounds normal to anterior ascultation GI: Soft.  No distension. There is no tenderness.  Skin: WDI  Neuro: Mental Status: Awake alert and following pulils are equal and react CNII-XII grossly intact Moves all extremities equally and on command  A/P - opioid overdose now awake and following commands. EEG and MRI negative.  Needs extubation and further supportive care.  Please feel free to reach out to us with any further questions you might have.  I will be signing off at this time

## 2016-04-27 NOTE — Progress Notes (Signed)
PULMONARY / CRITICAL CARE MEDICINE   Name: Jane Watkins MRN: [id number] DOB: [birth year]    ADMISSION DATE:  04/19/2016  CHIEF COMPLAINT:  Unresponsive  BRIEF SUMMARY:  21 y/o F with a history of opiate abuse who was found next to her boyfriend (who was deceased). She was unresponsive. She was brought to [facility] where she was found to combative and was intubated for airway protection, concern for aspiration. Her CK was 15k, and had a mild lactic acidosis. She was brought to [facility] for further evaluation.  SUBJECTIVE:  Weaning this AM, no events overnight  VITAL SIGNS: BP 113/67   Pulse (!) 103   Temp 98.2 F (36.8 C) (Axillary)   Resp (!) 23   Ht 5\' 4"  (1.626 m)   Wt 59 kg (130 lb 1.1 oz)   SpO2 97%   BMI 22.33 kg/m   HEMODYNAMICS:    VENTILATOR SETTINGS: Vent Mode: PSV;CPAP FiO2 (%):  [30 %-40 %] 40 % Set Rate:  [22 bmp] 22 bmp Vt Set:  [450 mL] 450 mL PEEP:  [5 cmH20] 5 cmH20 Pressure Support:  [5 cmH20] 5 cmH20 Plateau Pressure:  [16 cmH20-24 cmH20] 18 cmH20  INTAKE / OUTPUT: I/O last 3 completed shifts: In: 5494.5 [I.V.:1464.5; NG/GT:1980; IV Piggyback:2050] Out: 3980 [Urine:3980]  PHYSICAL EXAMINATION: General: Young adult female agitated off sedation but following commands this AM HEENT: MM pink/moist, ETT, [place]/AT, PERRL Neuro: Arousable, moving all ext to command CV: s1s2 rrr, no m/r/g PULM: CTA bilaterally WU:JWJX, non-tender, bsx4 active  Extremities: warm/dry, no edema  Skin: no rashes or lesions  LABS:  BMET  Recent Labs Lab 04/25/16 0505 04/26/16 0500 04/27/16 0501  NA 141 141 139  K 3.9 3.7 3.3*  CL 112* 112* 107  CO2 25 25 25   BUN 8 11 14   CREATININE 0.36* 0.33* 0.30*  GLUCOSE 145* 127* 98   Electrolytes  Recent Labs Lab 04/25/16 0505 04/26/16 0500 04/27/16 0501  CALCIUM 8.1* 8.5* 8.2*  MG 2.2 2.0 1.7  PHOS 3.5 3.7 3.2   CBC  Recent Labs Lab 04/25/16 0505 04/26/16 0500 04/27/16 0501  WBC 7.8 7.0 9.2  HGB 10.7* 10.5*  10.5*  HCT 31.2* 31.5* 31.0*  PLT 280 290 273   Coag's No results for input(s): APTT, INR in the last 168 hours.  Sepsis Markers No results for input(s): LATICACIDVEN, PROCALCITON, O2SATVEN in the last 168 hours. ABG  Recent Labs Lab 04/25/16 0949 04/26/16 0406 04/27/16 0420  PHART 7.364 7.383 7.451*  PCO2ART 47.2 41.9 33.5  PO2ART 371.0* 80.8* 94.8   Liver Enzymes  Recent Labs Lab 04/21/16 0235 04/22/16 0334 04/25/16 0505  AST 66* 55* 25  ALT 68* 60* 36  ALKPHOS 42 42 33*  BILITOT 1.5* 1.5* 0.4  ALBUMIN 2.7* 3.0* 2.6*   Cardiac Enzymes  Recent Labs Lab 04/20/16 1149 04/22/16 1203  TROPONINI 2.34* 0.51*   Glucose  Recent Labs Lab 04/26/16 1150 04/26/16 1559 04/26/16 2005 04/26/16 2343 04/27/16 0323 04/27/16 0814  GLUCAP 100* 110* 93 118* 109* 70   Imaging Dg Chest Port 1 View  Result Date: 04/27/2016 CLINICAL DATA:  Endotracheal tube placement. EXAM: PORTABLE CHEST 1 VIEW COMPARISON:  04/26/2016 FINDINGS: Endotracheal tube along the right side of the trachea at 2.2 cm above the Watkins. Nasogastric tube courses into the region of the stomach and off the inferior portion of the film as tip is not visualized although side port projects over the stomach. Right-sided PICC line tip at the cavoatrial junction. Lungs are  adequately inflated and demonstrate improvement in the previously noted hazy opacification of the right base. There is now mild hazy opacification of the left midlung/perihilar region. No effusion or pneumothorax. Cardiothymic silhouette and remainder of the exam is unchanged. IMPRESSION: Resolved hazy density over the right base with development of hazy opacification in the left midlung/perihilar region which may be due to atelectasis or infection. Tubes and lines as described. Electronically Signed   By: Elberta Fortis M.D.   On: 04/27/2016 07:28    STUDIES:  Head CT 3/10 >> Normal ECHO 3/11 >> LVEF 65-70%, no RWMA, no evidence of  vegetation Continuous EEG 3/13 >> MRI 3/13 >> unremarkable appearance of the brain  CULTURES: Sputum 3/11 >> MRSA  BCx2 3/11 >>  UC 3/10 >> greater 100k coag neg staph >> sens vanco  ANTIBIOTICS: Unasyn 3/11 >> 3/14 Vancomycin 3/11 >>   SIGNIFICANT EVENTS: 3/10  Admit after overdose 3/13  Neurology consulted, continuous EEG initiated 3/15 wild   3/16 not as agitated  LINES/TUBES: ETT 3/10 >> 3/14 ett 3/14(FAILED EXTUBATION)>>  DISCUSSION: 21 y/o p/w AMS and intoxication following apparent opiate O/D.  Person was deceased at her side.   ASSESSMENT / PLAN:  PULMONARY A: Need for mechanical ventilation(extubated 3/14 and developed stridor and reintubated) Acute Respiratory failure, stridor post extubation RLL Aspiration PNA - mild airspace disease Tobacco Abuse  3/16 increased FIO2 needs P:   Maintain on PS as tolerated but no extubation until mental status improves Intermittent CXR Avoid periods of hypoxemia  PRN bronchodilators  CARDIOVASCULAR A:  Troponinemia - likely demand ischemia P:  ICU monitoring  Troponin cleared, no further interventions needed  RENAL  Recent Labs Lab 04/25/16 0505 04/26/16 0500 04/27/16 0501  K 3.9 3.7 3.3*   Recent Labs Lab 04/25/16 0505 04/26/16 0500 04/27/16 0501  NA 141 141 139   A:   Rhabdomyolysis(improving) resolved 3/16 ck 168 Lactic acidosis->resolved Hypokalemia  P:   KVO IVF Trend BMP / urinary output Replace electrolytes as indicated (K, Mg and Phos). Avoid nephrotoxic agents, ensure adequate renal perfusion Trend CK > cleared Low dose lasix today  GASTROINTESTINAL A:   No active issues P:   TF  NPO / OGT PPI for SUP  HEMATOLOGIC A:   No active issues P:  Lovenox for DVT prophylaxis   INFECTIOUS A:   Suspected Aspiration - minimal airspace disease on CXR MRSA in Sputum  Coag Neg Staph UTI P:   Trend PCT Follow cultures  Discontinue unasyn Vanco D7/8, stop after Monday's  dose.  ENDOCRINE A:   No active issues   P:   Trend glucose on BMP  NEUROLOGIC A:   Opiate Overdose  Acute Encephalopathy  Rule Out Anoxic Injury, not evident on head MRI 3/13 P:   RASS Goal: 0 to -1  Precedex at 0.7 D/C Propofol Fentanyl gtt for pain, will add fentanyl patch to minimize drip Appreciate Neurology input  Thiamine/folic acid Increase Seroquel to 50 mg BID Increased Klonopin to 2 mg PO BID, will continue  FAMILY  - Updates:  No family bedside.  Weaning well today, likely give another chance at extubation in AM, if fails then family is ok with trach.  - Inter-disciplinary family meet or Palliative Care meeting due by:  04/26/16  The patient is critically ill with multiple organ systems failure and requires high complexity decision making for assessment and support, frequent evaluation and titration of therapies, application of advanced monitoring technologies and extensive interpretation of multiple databases.  Critical Care Time devoted to patient care services described in this note is  35  Minutes. This time reflects time of care of this signee Dr Koren BoundWesam Trayon Krantz. This critical care time does not reflect procedure time, or teaching time or supervisory time of PA/NP/Med student/Med Resident etc but could involve care discussion time.  Alyson ReedyWesam G. Avel Ogawa, M.D. [facility] Medicine. Pager: [phone]. After hours pager: [phone].

## 2016-04-28 ENCOUNTER — Inpatient Hospital Stay (HOSPITAL_COMMUNITY): Payer: 59

## 2016-04-28 DIAGNOSIS — R748 Abnormal levels of other serum enzymes: Secondary | ICD-10-CM

## 2016-04-28 DIAGNOSIS — F1110 Opioid abuse, uncomplicated: Secondary | ICD-10-CM

## 2016-04-28 DIAGNOSIS — R402431 Glasgow coma scale score 3-8, in the field [EMT or ambulance]: Secondary | ICD-10-CM

## 2016-04-28 DIAGNOSIS — R40243 Glasgow coma scale score 3-8, unspecified time: Secondary | ICD-10-CM

## 2016-04-28 LAB — BASIC METABOLIC PANEL
Anion gap: 9 (ref 5–15)
BUN: 9 mg/dL (ref 6–20)
CALCIUM: 8.6 mg/dL — AB (ref 8.9–10.3)
CO2: 25 mmol/L (ref 22–32)
CREATININE: 0.4 mg/dL — AB (ref 0.44–1.00)
Chloride: 103 mmol/L (ref 101–111)
Glucose, Bld: 92 mg/dL (ref 65–99)
Potassium: 3.9 mmol/L (ref 3.5–5.1)
Sodium: 137 mmol/L (ref 135–145)

## 2016-04-28 LAB — CBC
HCT: 32.6 % — ABNORMAL LOW (ref 36.0–46.0)
Hemoglobin: 11 g/dL — ABNORMAL LOW (ref 12.0–15.0)
MCH: 29.9 pg (ref 26.0–34.0)
MCHC: 33.7 g/dL (ref 30.0–36.0)
MCV: 88.6 fL (ref 78.0–100.0)
PLATELETS: 305 10*3/uL (ref 150–400)
RBC: 3.68 MIL/uL — ABNORMAL LOW (ref 3.87–5.11)
RDW: 12.8 % (ref 11.5–15.5)
WBC: 12 10*3/uL — AB (ref 4.0–10.5)

## 2016-04-28 LAB — BLOOD GAS, ARTERIAL
Acid-Base Excess: 0.3 mmol/L (ref 0.0–2.0)
BICARBONATE: 24.7 mmol/L (ref 20.0–28.0)
DRAWN BY: 50222
FIO2: 40
MECHVT: 450 mL
O2 Saturation: 97.3 %
PEEP: 5 cmH2O
PO2 ART: 104 mmHg (ref 83.0–108.0)
Patient temperature: 98.6
RATE: 15 resp/min
pCO2 arterial: 41.8 mmHg (ref 32.0–48.0)
pH, Arterial: 7.389 (ref 7.350–7.450)

## 2016-04-28 LAB — GLUCOSE, CAPILLARY
GLUCOSE-CAPILLARY: 89 mg/dL (ref 65–99)
GLUCOSE-CAPILLARY: 83 mg/dL (ref 65–99)
GLUCOSE-CAPILLARY: 74 mg/dL (ref 65–99)
Glucose-Capillary: 84 mg/dL (ref 65–99)
Glucose-Capillary: 89 mg/dL (ref 65–99)

## 2016-04-28 LAB — MAGNESIUM: Magnesium: 2 mg/dL (ref 1.7–2.4)

## 2016-04-28 LAB — PHOSPHORUS: Phosphorus: 4.2 mg/dL (ref 2.5–4.6)

## 2016-04-28 LAB — PROTIME-INR
INR: 1.08
Prothrombin Time: 14 seconds (ref 11.4–15.2)

## 2016-04-28 LAB — APTT: APTT: 28 s (ref 24–36)

## 2016-04-28 MED ORDER — VECURONIUM BROMIDE 10 MG IV SOLR
INTRAVENOUS | Status: AC
  Filled 2016-04-28: qty 10

## 2016-04-28 MED ORDER — FENTANYL CITRATE (PF) 100 MCG/2ML IJ SOLN
200.0000 ug | Freq: Once | INTRAMUSCULAR | Status: AC
  Administered 2016-04-28: 100 ug via INTRAVENOUS
  Filled 2016-04-28: qty 4

## 2016-04-28 MED ORDER — CHLORHEXIDINE GLUCONATE 0.12 % MT SOLN
OROMUCOSAL | Status: AC
  Administered 2016-04-28: 15 mL via OROMUCOSAL
  Filled 2016-04-28: qty 15

## 2016-04-28 MED ORDER — MIDAZOLAM HCL 2 MG/2ML IJ SOLN
4.0000 mg | Freq: Once | INTRAMUSCULAR | Status: AC
  Administered 2016-04-28: 2 mg via INTRAVENOUS
  Filled 2016-04-28: qty 4

## 2016-04-28 MED ORDER — PANTOPRAZOLE SODIUM 40 MG PO PACK
40.0000 mg | PACK | Freq: Every day | ORAL | Status: DC
  Filled 2016-04-28: qty 20

## 2016-04-28 MED ORDER — PROPOFOL 500 MG/50ML IV EMUL
5.0000 ug/kg/min | Freq: Once | INTRAVENOUS | Status: AC
  Administered 2016-04-28: 50 ug/kg/min via INTRAVENOUS

## 2016-04-28 MED ORDER — VECURONIUM BROMIDE 10 MG IV SOLR
10.0000 mg | Freq: Once | INTRAVENOUS | Status: AC
  Administered 2016-04-28: 7 mg via INTRAVENOUS
  Filled 2016-04-28: qty 10

## 2016-04-28 MED ORDER — BISACODYL 10 MG RE SUPP
10.0000 mg | Freq: Every day | RECTAL | Status: DC | PRN
Start: 2016-04-28 — End: 2016-05-01
  Administered 2016-04-28: 10 mg via RECTAL
  Filled 2016-04-28: qty 1

## 2016-04-28 MED ORDER — ETOMIDATE 2 MG/ML IV SOLN
40.0000 mg | Freq: Once | INTRAVENOUS | Status: AC
  Administered 2016-04-28: 20 mg via INTRAVENOUS
  Filled 2016-04-28: qty 20

## 2016-04-28 MED ORDER — FUROSEMIDE 10 MG/ML IJ SOLN
20.0000 mg | Freq: Two times a day (BID) | INTRAMUSCULAR | Status: DC
  Administered 2016-04-28 (×2): 20 mg via INTRAVENOUS
  Filled 2016-04-28: qty 2

## 2016-04-28 MED ORDER — FENTANYL CITRATE (PF) 100 MCG/2ML IJ SOLN
25.0000 ug | INTRAMUSCULAR | Status: AC | PRN
  Administered 2016-04-28: 100 ug via INTRAVENOUS
  Filled 2016-04-28: qty 2

## 2016-04-28 NOTE — Progress Notes (Signed)
Pt vomit X3 Elink MD made aware. OG placed to ILWS.

## 2016-04-28 NOTE — Progress Notes (Signed)
eLink Physician-Brief Progress Note Patient Name: Jane CarinaLydia Skoglund DOB: 03/09/1995 MRN: [id number]   Date of Service  04/28/2016  HPI/Events of Note  No bowel movements since February 28.   eICU Interventions  Will try suppository. If it doesn't work, we'll try enema. May need  SMOG.      Intervention Category Major Interventions: Other:  Daneen SchickJose Angelo A De Dios 04/28/2016, 6:02 PM

## 2016-04-28 NOTE — Progress Notes (Signed)
PULMONARY / CRITICAL CARE MEDICINE   Name: Jane Watkins MRN: [id number] DOB: Jan 12, 1996    ADMISSION DATE:  04/19/2016  CHIEF COMPLAINT:  Unresponsive  BRIEF SUMMARY:  21 y/o F with a history of opiate abuse who was found next to her boyfriend (who was deceased). She was unresponsive. She was brought to [facility] where she was found to combative and was intubated for airway protection, concern for aspiration. Her CK was 15k, and had a mild lactic acidosis. She was brought to [facility] for further evaluation.  SUBJECTIVE:  Follows commands, decadron prior for failed extubation, neg 1.2 liters  VITAL SIGNS: BP (!) 111/56   Pulse 89   Temp 99.9 F (37.7 C) (Axillary)   Resp 20   Ht 5\' 4"  (1.626 m)   Wt 59 kg (130 lb 1.1 oz)   LMP  (LMP Unknown)   SpO2 98%   BMI 22.33 kg/m   HEMODYNAMICS:    VENTILATOR SETTINGS: Vent Mode: CPAP;PSV FiO2 (%):  [40 %] 40 % Set Rate:  [15 bmp-22 bmp] 15 bmp Vt Set:  [450 mL] 450 mL PEEP:  [5 cmH20] 5 cmH20 Pressure Support:  [5 cmH20-10 cmH20] 5 cmH20 Plateau Pressure:  [17 cmH20-18 cmH20] 18 cmH20  INTAKE / OUTPUT: I/O last 3 completed shifts: In: 5415.9 [I.V.:825.9; NG/GT:1980; IV Piggyback:2610] Out: 6250 [Urine:5950; Emesis/NG output:300]  PHYSICAL EXAMINATION: General: Young adult female agitated off sedation but following commands HEENT: jvd w nl, ETT Neuro: FC, moves all ext eqaully CV: s1s2 rrr, no m/r/g PULM: CTA  WU:JWJX, non-tender, bsx4 active  Extremities: warm/dry, no edema  Skin: no rashes or lesions  LABS:  BMET  Recent Labs Lab 04/26/16 0500 04/27/16 0501 04/28/16 0519  NA 141 139 137  K 3.7 3.3* 3.9  CL 112* 107 103  CO2 25 25 25   BUN 11 14 9   CREATININE 0.33* 0.30* 0.40*  GLUCOSE 127* 98 92   Electrolytes  Recent Labs Lab 04/26/16 0500 04/27/16 0501 04/28/16 0519  CALCIUM 8.5* 8.2* 8.6*  MG 2.0 1.7 2.0  PHOS 3.7 3.2 4.2   CBC  Recent Labs Lab 04/26/16 0500 04/27/16 0501 04/28/16 0519  WBC  7.0 9.2 12.0*  HGB 10.5* 10.5* 11.0*  HCT 31.5* 31.0* 32.6*  PLT 290 273 305   Coag's No results for input(s): APTT, INR in the last 168 hours.  Sepsis Markers No results for input(s): LATICACIDVEN, PROCALCITON, O2SATVEN in the last 168 hours. ABG  Recent Labs Lab 04/26/16 0406 04/27/16 0420 04/28/16 0445  PHART 7.383 7.451* 7.389  PCO2ART 41.9 33.5 41.8  PO2ART 80.8* 94.8 104   Liver Enzymes  Recent Labs Lab 04/22/16 0334 04/25/16 0505  AST 55* 25  ALT 60* 36  ALKPHOS 42 33*  BILITOT 1.5* 0.4  ALBUMIN 3.0* 2.6*   Cardiac Enzymes  Recent Labs Lab 04/22/16 1203  TROPONINI 0.51*   Glucose  Recent Labs Lab 04/27/16 1148 04/27/16 1604 04/27/16 2014 04/27/16 2331 04/28/16 0340 04/28/16 0817  GLUCAP 113* 87 97 96 89 84   Imaging Dg Chest Port 1 View  Result Date: 04/28/2016 CLINICAL DATA:  Hypoxia EXAM: PORTABLE CHEST 1 VIEW COMPARISON:  April 27, 2016 FINDINGS: Endotracheal tube tip is 1.6 cm above the Watkins. Nasogastric tube tip and side port below the diaphragm. Central catheter tip is in the superior vena cava. No pneumothorax. No edema or consolidation. Heart size and pulmonary vascularity are normal. No adenopathy. No bone lesions. IMPRESSION: Tube and catheter positions as described without pneumothorax. No edema  or consolidation. Interval clearing of opacity from the left perihilar region. Electronically Signed   By: Bretta BangWilliam  Woodruff III M.D.   On: 04/28/2016 07:11   Dg Abd Portable 1v  Result Date: 04/27/2016 CLINICAL DATA:  21 year old female with enteric tube placement. EXAM: PORTABLE ABDOMEN - 1 VIEW COMPARISON:  Lumbar spine radiograph dated 12/16/2015 and chest radiograph dated 04/27/2016 FINDINGS: Partially visualized enteric tube with tip in the left upper abdomen likely in the proximal stomach. The side port of the enteric tube is in the region of the gastroesophageal junction. Recommend advancing of the tube further into the stomach. There is  no bowel dilatation or evidence of obstruction. Air is noted within the colon. No free air or radiopaque calculi identified on the provided image. The osseous structures and soft tissues are grossly unremarkable. IMPRESSION: Enteric tube with tip in the proximal stomach and side port likely in the region of the gastroesophageal junction. Recommend advancing the tube further into the stomach. Electronically Signed   By: Elgie CollardArash  Radparvar M.D.   On: 04/27/2016 22:20    STUDIES:  Head CT 3/10 >> Normal ECHO 3/11 >> LVEF 65-70%, no RWMA, no evidence of vegetation Continuous EEG 3/13 >> MRI 3/13 >> unremarkable appearance of the brain  CULTURES: Sputum 3/11 >> MRSA  BCx2 3/11 >>  UC 3/10 >> greater 100k coag neg staph >> sens vanco  ANTIBIOTICS: Unasyn 3/11 >> 3/14 Vancomycin 3/11 >> 3/20  SIGNIFICANT EVENTS: 3/10  Admit after overdose 3/13  Neurology consulted, continuous EEG initiated 3/15 wild   3/16 not as agitated  LINES/TUBES: ETT 3/10 >> 3/14 ett 3/14(FAILED EXTUBATION)>>  DISCUSSION: 21 y/o p/w AMS and intoxication following apparent opiate O/D.  Person was deceased at her side.   ASSESSMENT / PLAN:  PULMONARY A: Need for mechanical ventilation(extubated 3/14 and developed stridor and reintubated) Acute Respiratory failure, stridor post extubation RLL Aspiration PNA - mild airspace disease Tobacco Abuse  3/16 increased FIO2 needs P:   ABg reviewed, keep same MV Wean cpap 5 ps 5, goal 2 hour Leak test needed again Even to neg balance as tolerated PRN bronchodilators - not needed  CARDIOVASCULAR A:  Troponinemia - likely demand ischemia P:  ICU monitoring   RENAL  Recent Labs Lab 04/26/16 0500 04/27/16 0501 04/28/16 0519  K 3.7 3.3* 3.9    Recent Labs Lab 04/26/16 0500 04/27/16 0501 04/28/16 0519  NA 141 139 137   A:   Rhabdomyolysis(improving) resolved 3/16 ck 168 Lactic acidosis->resolved Hypokalemia  P:   Chem in am  Lasix repeat   kvo  GASTROINTESTINAL A:   vomting ( from upper airway irritation?) P:   TF on hold NPO / OGT PPI for SUP  HEMATOLOGIC A:   No active issues P:  Lovenox for DVT prophylaxis   INFECTIOUS A:   Suspected Aspiration - minimal airspace disease on CXR MRSA in Sputum  Coag Neg Staph UTI P:   vanc to 20th then dc pcxr in am   ENDOCRINE A:   No active issues   P:   Trend glucose on BMP  NEUROLOGIC A:   Opiate Overdose  Acute Encephalopathy  Rule Out Anoxic Injury, not evident on head MRI 3/13 P:   RASS Goal: 0  Precedex WUA Thiamine/folic acid Increase Seroquel to 50 mg BID Increased Klonopin to 2 mg PO BID, will continue  Ccm time 35 min   Mcarthur Rossettianiel J. Tyson AliasFeinstein, MD, FACP Pgr: [phone] [place] [facility]

## 2016-04-28 NOTE — Procedures (Signed)
Bronchoscopy Procedure Note Jane Watkins [id number] 11/21/1995  Procedure: Bronchoscopy Indications: Diagnostic evaluation of the airways  Procedure Details Consent: Risks of procedure as well as the alternatives and risks of each were explained to the (patient/caregiver).  Consent for procedure obtained. Time Out: Verified patient identification, verified procedure, site/side was marked, verified correct patient position, special equipment/implants available, medications/allergies/relevent history reviewed, required imaging and test results available.  Performed  In preparation for procedure, patient was given 100% FiO2 and bronchoscope lubricated. Sedation: Benzodiazepines, Muscle relaxants and Etomidate  Airway entered and the following bronchi were examined: RUL, RML, RLL, LUL, LLL and Bronchi.   Bronchoscope removed.  , Patient placed back on 100% FiO2 at conclusion of procedure.    Evaluation Hemodynamic Status: BP stable throughout; O2 sats: stable throughout Patient's Current Condition: stable Specimens:  None Complications: No apparent complications Patient did tolerate procedure well.   Jane BoundYACOUB,Miia Blanks 04/28/2016

## 2016-04-28 NOTE — Procedures (Signed)
Name:  Jane Watkins MRN:  [id number] DOB:  08/20/1995  OPERATIVE NOTE  Procedure:  Percutaneous tracheostomy.  Indications:  Ventilator-dependent respiratory failure.  Consent:  Procedure, alternatives, risks and benefits discussed with medical POA.  Questions answered.  Consent obtained.  Anesthesia:  Prop, versed, fent,. vec  Procedure summary:  Appropriate equipment was assembled.  The patient was identified as Jane Watkins and safety timeout was performed. The patient was placed in supine position with a towel roll behind shoulder blades and neck extended.  Sterile technique was used. The patient's neck and upper chest were prepped using chlorhexidine / alcohol scrub and the field was draped in usual sterile fashion with full body drape. After the adequate sedation / anesthesia was achieved, attention was directed at the midline trachea, where the cricothyroid membrane was palpated. Approximately two fingerbreadths above the sternal notch, a verticle  incision was created with a scalpel after local infiltration with 0.2% Lidocaine. Then, using Seldinger technique and a percutaneous tracheostomy set, the trachea was entered with a 14 gauge needle with an overlying sheath. This was all confirmed under direct visualization of a fiberoptic flexible bronchoscope. Entrance into the trachea was identified through the third tracheal ring interspace. Following this, a guidewire was inserted. The needle was removed, leaving the sheath and the guidewire intact. Next, the sheath was removed and a small dilator was inserted. The tracheal rings were then dilated. A #6 Shiley was then opened. The balloon was checked. It was placed over a tracheal dilator, which was then advanced over the guidewire and through the previously dilated tract. The Shiley tracheostomy tube was noted to pass in the trachea with little resistance. The guidewire and dilator tubes were removed from the trachea. An inner cannula was placed  through the tracheostomy tube. The tracheostomy was then secured at the anterior neck with 4 monofilament sutures. The oral endotracheal tube was removed and the ventilator was attached to the newly placed tracheostomy tube. Adequate tidal volumes were noted. The cuff was inflated and no evidence of air leak was noted. No evidence of bleeding was noted. At this point, the procedure was concluded. Post-procedure chest x-ray was ordered.  Complications:  No immediate complications were noted.  Hemodynamic parameters and oxygenation remained stable throughout the procedure.  Estimated blood loss:  Less then 1mL.  Nelda BucksFEINSTEIN,DANIEL J., MD Pulmonary and Critical Care Medicine [facility] Pager: [phone]  04/28/2016, 1:39 PM   Should follow up [facility] trach clinic [phone]

## 2016-04-28 NOTE — Procedures (Signed)
Bedside Tracheostomy Insertion Procedure Note   Patient Details:   Name: Jane Watkins DOB: 05/17/1995 MRN: [id number]  Procedure: Tracheostomy  Pre Procedure Assessment: ET Tube Size: 7.5 ET Tube secured at lip (cm): 21 lips Bite block in plac  yes Breath Sounds: Rhonch  Post Procedure Assessment: BP (!) 87/51   Pulse (!) 114   Temp 98.5 F (36.9 C) (Axillary)   Resp (!) 21   Ht 5\' 4"  (1.626 m)   Wt 130 lb 1.1 oz (59 kg)   LMP  (LMP Unknown)   SpO2 98%   BMI 22.33 kg/m  O2 sats: stable throughout Complications: No apparent complications Patient did tolerate procedure well Tracheostomy Brand:Shiley Tracheostomy Style:Cuffed Tracheostomy Size: 6 Tracheostomy Secured UXL:[id number]:Sutures and velcro Tracheostomy Placement Confirmation:Trach cuff visualized and in place and chest x-ray.  No complications, 100% oxygen given during procedure.  Full ventilator support during procedure.  RT Jane Watkins notified.    Jane Watkins, Jane Watkins 04/28/2016, 1:51 PM

## 2016-04-29 ENCOUNTER — Inpatient Hospital Stay (HOSPITAL_COMMUNITY): Payer: 59

## 2016-04-29 LAB — GLUCOSE, CAPILLARY
GLUCOSE-CAPILLARY: 112 mg/dL — AB (ref 65–99)
GLUCOSE-CAPILLARY: 86 mg/dL (ref 65–99)
Glucose-Capillary: 109 mg/dL — ABNORMAL HIGH (ref 65–99)
Glucose-Capillary: 75 mg/dL (ref 65–99)
Glucose-Capillary: 84 mg/dL (ref 65–99)
Glucose-Capillary: 91 mg/dL (ref 65–99)
Glucose-Capillary: 94 mg/dL (ref 65–99)

## 2016-04-29 LAB — BASIC METABOLIC PANEL
Anion gap: 12 (ref 5–15)
BUN: 12 mg/dL (ref 6–20)
CHLORIDE: 100 mmol/L — AB (ref 101–111)
CO2: 24 mmol/L (ref 22–32)
CREATININE: 0.49 mg/dL (ref 0.44–1.00)
Calcium: 9.2 mg/dL (ref 8.9–10.3)
GFR calc non Af Amer: >60 mL/min (ref >60)
GLUCOSE: 85 mg/dL (ref 65–99)
Potassium: 3.5 mmol/L (ref 3.5–5.1)
Sodium: 136 mmol/L (ref 135–145)

## 2016-04-29 MED ORDER — CHLORHEXIDINE GLUCONATE 0.12 % MT SOLN
OROMUCOSAL | Status: AC
  Filled 2016-04-29: qty 15

## 2016-04-29 MED ORDER — QUETIAPINE FUMARATE 100 MG PO TABS
100.0000 mg | ORAL_TABLET | Freq: Two times a day (BID) | ORAL | Status: DC
  Administered 2016-04-30 – 2016-05-01 (×3): 100 mg via ORAL
  Filled 2016-04-29 (×3): qty 1

## 2016-04-29 MED ORDER — CHLORHEXIDINE GLUCONATE 0.12 % MT SOLN
15.0000 mL | Freq: Two times a day (BID) | OROMUCOSAL | Status: DC
  Administered 2016-04-30 – 2016-05-01 (×3): 15 mL via OROMUCOSAL
  Filled 2016-04-29: qty 15

## 2016-04-29 MED ORDER — QUETIAPINE FUMARATE 25 MG PO TABS: 50.0000 mg | ORAL_TABLET | Freq: Two times a day (BID) | ORAL | Status: DC

## 2016-04-29 MED ORDER — DEXTROSE IN LACTATED RINGERS 5 % IV SOLN
INTRAVENOUS | Status: DC
  Administered 2016-04-29 – 2016-04-30 (×2): via INTRAVENOUS
  Filled 2016-04-29 (×6): qty 1000

## 2016-04-29 MED ORDER — SODIUM CHLORIDE 0.9 % IV SOLN
30.0000 meq | Freq: Once | INTRAVENOUS | Status: AC
  Administered 2016-04-29: 30 meq via INTRAVENOUS
  Filled 2016-04-29: qty 15

## 2016-04-29 MED ORDER — ORAL CARE MOUTH RINSE
15.0000 mL | Freq: Two times a day (BID) | OROMUCOSAL | Status: DC
  Administered 2016-04-30 – 2016-05-01 (×4): 15 mL via OROMUCOSAL

## 2016-04-29 MED ORDER — FENTANYL CITRATE (PF) 100 MCG/2ML IJ SOLN
25.0000 ug | INTRAMUSCULAR | Status: DC | PRN
  Administered 2016-04-29 – 2016-04-30 (×4): 50 ug via INTRAVENOUS
  Filled 2016-04-29 (×3): qty 2

## 2016-04-29 MED ORDER — DEXMEDETOMIDINE HCL IN NACL 200 MCG/50ML IV SOLN: 0.4000 ug/kg/h | INTRAVENOUS | Status: DC

## 2016-04-29 MED ORDER — ACETYLCYSTEINE 20 % IN SOLN
4.0000 mL | Freq: Two times a day (BID) | RESPIRATORY_TRACT | Status: AC
Start: 2016-04-29 — End: 2016-04-30
  Administered 2016-04-29 – 2016-04-30 (×3): 4 mL via RESPIRATORY_TRACT
  Filled 2016-04-29 (×3): qty 4

## 2016-04-29 MED ORDER — FUROSEMIDE 10 MG/ML IJ SOLN
20.0000 mg | Freq: Every day | INTRAMUSCULAR | Status: DC
  Administered 2016-04-29: 20 mg via INTRAVENOUS
  Filled 2016-04-29: qty 2

## 2016-04-29 MED ORDER — PANTOPRAZOLE SODIUM 40 MG IV SOLR
40.0000 mg | INTRAVENOUS | Status: DC
  Administered 2016-04-29 – 2016-05-01 (×3): 40 mg via INTRAVENOUS
  Filled 2016-04-29 (×3): qty 40

## 2016-04-29 NOTE — Progress Notes (Signed)
Instructed on procedure. Pt held breath during line pull. Pressure held to site with no s/sx of bleeding at this time. Instructed pt that pressure drsg needs to remain CDI for 24 hours. Monitor for s/sx of bleeding and to report. Pt nurse aware line is out at this time. VU. Tomasita MorrowHeather Maren Wiesen, RN VAST

## 2016-04-29 NOTE — Progress Notes (Signed)
Case Management Note  Patient Details  Name: Jane Watkins MRN: [id number] Date of Birth: 05/24/1995  Subjective/Objective: Pt admitted on 04/19/16 with an apparent opiate OD.  PTA, pt independent of ADLS.                     Action/Plan: Pt currently remains intubated; not responding currently.  Will follow progress.    Expected Discharge Date:                         Expected Discharge Plan:     In-House Referral:  Clinical Social Work  Discharge planning Services  CM Consult  Post Acute Care Choice:    Choice offered to:     DME Arranged:    DME Agency:     HH Arranged:    HH Agency:     Status of Service:  In process, will continue to follow  If discussed at Long Length of Stay Meetings, dates discussed:    Additional Comments:  04/29/16 J. Lealon Vanputten, RN, BSN Pt s/p tracheostomy on 04/28/16.  PT/OT evaluations done today; recommending CIR consult.  MD, please order if you agree.    Glennon MacAmerson, Khrystina Bonnes M, RN 04/22/2016, 4:50 PM

## 2016-04-29 NOTE — Progress Notes (Signed)
CPT done at 10:38.

## 2016-04-29 NOTE — Progress Notes (Addendum)
PULMONARY / CRITICAL CARE MEDICINE   Name: Jane Watkins MRN: [id number] DOB: 03/10/1995    ADMISSION DATE:  04/19/2016  CHIEF COMPLAINT:  Unresponsive  BRIEF SUMMARY:  21 y/o F with a history of opiate abuse who was found next to her boyfriend (who was deceased). She was unresponsive. She was brought to [facility] where she was found to combative and was intubated for airway protection, concern for aspiration. Her CK was 15k, and had a mild lactic acidosis. She was brought to [facility] for further evaluation.  SUBJECTIVE:  Follows commands, less agitated  VITAL SIGNS: BP 109/60   Pulse 95   Temp 99.8 F (37.7 C) (Axillary)   Resp 20   Ht 5\' 4"  (1.626 m)   Wt 55 kg (121 lb 4.1 oz)   LMP  (LMP Unknown)   SpO2 99%   BMI 20.81 kg/m   HEMODYNAMICS:    VENTILATOR SETTINGS: Vent Mode: PRVC FiO2 (%):  [40 %-90 %] 50 % Set Rate:  [20 bmp] 20 bmp Vt Set:  [450 mL] 450 mL PEEP:  [5 cmH20-8 cmH20] 8 cmH20 Pressure Support:  [5 cmH20] 5 cmH20 Plateau Pressure:  [18 cmH20-24 cmH20] 18 cmH20  INTAKE / OUTPUT: I/O last 3 completed shifts: In: 3350.6 [I.V.:690.6; NG/GT:660; IV Piggyback:2000] Out: 7829 [FAOZH:[id number] [Urine:4473; Emesis/NG output:300]  PHYSICAL EXAMINATION: General: Young adult female , on diprivan but follows commands HEENT: jvd w nl, Trach Neuro: FC, moves all ext eqaully CV: s1s2 rrr, no m/r/g PULM: CTA , full vent support with peep 8 HQ:IONGGI:soft, non-tender, bsx4 active , 1 bm, tf on hold Extremities: warm/dry, no edema  Skin: no rashes or lesions  LABS:  BMET  Recent Labs Lab 04/27/16 0501 04/28/16 0519 04/29/16 0500  NA 139 137 136  K 3.3* 3.9 3.5  CL 107 103 100*  CO2 25 25 24   BUN 14 9 12   CREATININE 0.30* 0.40* 0.49  GLUCOSE 98 92 85   Electrolytes  Recent Labs Lab 04/26/16 0500 04/27/16 0501 04/28/16 0519 04/29/16 0500  CALCIUM 8.5* 8.2* 8.6* 9.2  MG 2.0 1.7 2.0  --   PHOS 3.7 3.2 4.2  --    CBC  Recent Labs Lab 04/26/16 0500 04/27/16 0501  04/28/16 0519  WBC 7.0 9.2 12.0*  HGB 10.5* 10.5* 11.0*  HCT 31.5* 31.0* 32.6*  PLT 290 273 305   Coag's  Recent Labs Lab 04/28/16 1034  APTT 28  INR 1.08    Sepsis Markers No results for input(s): LATICACIDVEN, PROCALCITON, O2SATVEN in the last 168 hours. ABG  Recent Labs Lab 04/26/16 0406 04/27/16 0420 04/28/16 0445  PHART 7.383 7.451* 7.389  PCO2ART 41.9 33.5 41.8  PO2ART 80.8* 94.8 104   Liver Enzymes  Recent Labs Lab 04/25/16 0505  AST 25  ALT 36  ALKPHOS 33*  BILITOT 0.4  ALBUMIN 2.6*   Cardiac Enzymes  Recent Labs Lab 04/22/16 1203  TROPONINI 0.51*   Glucose  Recent Labs Lab 04/28/16 0817 04/28/16 1130 04/28/16 1554 04/28/16 2031 04/29/16 0052 04/29/16 0419  GLUCAP 84 89 83 74 84 86   Imaging Dg Chest Port 1 View  Result Date: 04/28/2016 CLINICAL DATA:  Acute respiratory failure. EXAM: PORTABLE CHEST 1 VIEW COMPARISON:  04/28/2016. FINDINGS: Interval extubation with placement of a midline tracheostomy tube. Right PICC tip projects over the low SVC. Heart size normal. New bibasilar airspace opacification, right greater than left. No pleural fluid. IMPRESSION: New bibasilar airspace opacification, right greater than left, may be due to atelectasis or aspiration.  Electronically Signed   By: Leanna Battles M.D.   On: 04/28/2016 14:30    STUDIES:  Head CT 3/10 >> Normal ECHO 3/11 >> LVEF 65-70%, no RWMA, no evidence of vegetation Continuous EEG 3/13 >> MRI 3/13 >> unremarkable appearance of the brain  CULTURES: Sputum 3/11 >> MRSA  BCx2 3/11 >>neg  UC 3/10 >> greater 100k coag neg staph >> sens vanco  ANTIBIOTICS: Unasyn 3/11 >> 3/14 Vancomycin 3/11 >> 05-25-2022  SIGNIFICANT EVENTS: 3/10  Admit after overdose 3/13  Neurology consulted, continuous EEG initiated 3/15 wild   3/16 not as agitated 05/25/22 will try t collar  LINES/TUBES: ETT 3/10 >> 3/14 ett 3/14(FAILED EXTUBATION)>>3/19 Trach 3/19>>  DISCUSSION: 21 y/o p/w AMS and  intoxication following apparent opiate O/D.  Person was deceased at her side. 05-25-2022 follows commands.   ASSESSMENT / PLAN:  PULMONARY A: Need for mechanical ventilation(extubated 3/14 and developed stridor and reintubated) Acute Respiratory failure, stridor post extubation RLL Aspiration PNA - mild airspace disease Tobacco Abuse  3/16 increased FIO2 needs Trached 3/19 and required increased fio2 and peep 8 with possible mucus plug. Resolved 2022-05-25 ? If clonidine might help with wd sxs but BP low P:  Decrease peep to 5  Try t collar May 25, 2022 Wean vent as tolerated Decrease sedation. Use precedex and turn diprivan off during day Increase Seroquel 2022/05/25 to 100 mg bid PRN bronchodilators - not needed  CARDIOVASCULAR A:  Troponinemia - likely demand ischemia P:  ICU monitoring   RENAL  Recent Labs Lab 04/27/16 0501 04/28/16 0519 May 24, 2016 0500  K 3.3* 3.9 3.5    Recent Labs Lab 04/27/16 0501 04/28/16 0519 05-24-2016 0500  NA 139 137 136   Filed Weights   04/27/16 0200 04/28/16 0600 May 24, 2016 0500  Weight: 59 kg (130 lb 1.1 oz) 59 kg (130 lb 1.1 oz) 55 kg (121 lb 4.1 oz)    Intake/Output Summary (Last 24 hours) at 2016/05/24 0802 Last data filed at 05-24-2016 0700  Gross per 24 hour  Intake          1908.34 ml  Output             2743 ml  Net          -834.66 ml   A:   Rhabdomyolysis(improving) resolved 3/16 ck 168 Lactic acidosis->resolved Hypokalemia  P:   Chem in am  kvo but if no tf will need to increase IVF to 50. Hold lasix 2022/05/25.  GASTROINTESTINAL A:   vomting ( from upper airway irritation?) P:   TF on hold NPO / no ogt waiting for swallow eval PPI for SUP  HEMATOLOGIC A:   No active issues P:  Lovenox for DVT prophylaxis   INFECTIOUS A:   Suspected Aspiration - minimal airspace disease on CXR MRSA in Sputum  Coag Neg Staph UTI P:   vanc to 20th then dc, stop date in place   ENDOCRINE A:   No active issues   P:   Trend glucose on  BMP  NEUROLOGIC A:   Opiate Overdose  Acute Encephalopathy  Rule Out Anoxic Injury, not evident on head MRI 3/13 P:   RASS Goal: 0  Diprivan + versed needed at night, weaning sedation as tolerated Thiamine/folic acid Increase Seroquel to 100mg  BID Increased Klonopin to 2 mg PO BID, will continue 25-May-2022 dc continuous IV fentanyl and go to intermittent  She will need psych consult in future  [facility] Minor ACNP Adolph Pollack PCCM Pager [phone] till 3 pm  If no answer page [phone] 04/29/2016, 7:45 AM    STAFF NOTE: I, Rory Percy, MD FACP have personally reviewed patient's available data, including medical history, events of note, physical examination and test results as part of my evaluation. I have discussed with resident/NP and other care providers such as pharmacist, RN and RRT. In addition, I personally evaluated patient and elicited key findings of: more awake, agitation, FC well, trach dry, lungs clarse improved rt base, improved O2 needs post trach had ATX from mucous, pcxr now resolved, consider mucomysts nebs and chest pt x 24 hours, dc foley, trach collar trial as goal, maintain lasix, k supp, re assess lytes in am, pt, ot consult, to triad as primary, stay pccm for trach, goal is to dc precedex The patient is critically ill with multiple organ systems failure and requires high complexity decision making for assessment and support, frequent evaluation and titration of therapies, application of advanced monitoring technologies and extensive interpretation of multiple databases.   Critical Care Time devoted to patient care services described in this note is 30 Minutes. This time reflects time of care of this signee: Rory Percy, MD FACP. This critical care time does not reflect procedure time, or teaching time or supervisory time of PA/NP/Med student/Med Resident etc but could involve care discussion time. Rest per NP/medical resident whose note is outlined above and that I agree  with   Mcarthur Rossetti. Tyson Alias, MD, FACP Pgr: [phone] [facility] 04/29/2016 9:10 AM

## 2016-04-29 NOTE — Progress Notes (Signed)
Inpatient Rehabilitation  Per OT request, patient was screened by Chrisean Kloth for appropriateness for an Inpatient Acute Rehab consult.  At this time we are recommending an Inpatient Rehab consult.  Please order if you are agreeable.    Jamey Demchak, M.A., CCC/SLP Admission Coordinator  [facility]  Cell [phone]  

## 2016-04-29 NOTE — Evaluation (Signed)
[facility] Speaking [facility]ve - Evaluation Patient Details  Name: Jane Watkins MRN: [id number] Date of Birth: 12/03/1995  Today's Date: 04/29/2016 Time: [id number] SLP Time Calculation (min) (ACUTE ONLY): 19 min  Past Medical History: History reviewed. No pertinent past medical history. Past Surgical History:  Past Surgical History:  Procedure Laterality Date  . APPENDECTOMY    . MOUTH SURGERY     HPI:  21 y/o F with a history of opiate abuse who was found unresponsive next to her boyfriend (who was deceased). She was intubated 3/10-3/14 but failed extubation due to stridor/swelling and was reintubated 3/14 until trach 3/19.    Assessment / Plan / Recommendation Clinical Impression  Pt's cuff was deflated with VS stable and no significant secretions. She did not show any signs of upper airway patency with digital occlusion and PMV placement though. Highly suspect edema is playing a role given swelling that was reportedly present prior to trach placement combined with recent trach placement on previous date. Size of trach may also be playing a role. Recommend use of PMV with SLP only for now, but would allow for cuff deflation as tolerated (cuff left deflated upon SLP departure - RN present and aware). Swallow eval deferred pending signs of increased upper airway patency.  SLP Visit Diagnosis: Aphonia (R49.1)    SLP Assessment  Patient needs continued Speech Lanaguage Pathology Services    Follow Up Recommendations  Inpatient Rehab    Frequency and Duration min 2x/week  2 weeks    PMSV Trial PMSV was placed for: few respiratory cycles Able to redirect subglottic air through upper airway: No Able to Attain Phonation: No Voice Quality: Aphonic Able to Expectorate Secretions: No attempts Level of Secretion Expectoration with PMSV: Not observed Breath Support for Phonation: Inadequate Intelligibility: Unable to assess (comment) Respirations During Trial:  (WFL) SpO2 During Trial:   (WFL) Pulse During Trial:  [facility](WFL) Behavior: Alert;Controlled;Cooperative   Tracheostomy Tube       Vent Dependency  FiO2 (%): 40 %    Cuff Deflation Trial  GO Tolerated Cuff Deflation: Yes Length of Time for Cuff Deflation Trial:  (left deflated) Behavior: Alert;Controlled;Cooperative        Maxcine Hamaiewonsky, Dellia Donnelly 04/29/2016, 1:08 PM  Maxcine HamLaura Paiewonsky, M.A. CCC-SLP [phone]

## 2016-04-29 NOTE — Evaluation (Signed)
Occupational Therapy Evaluation Patient Details Name: Jane Watkins MRN: [id number] DOB: 06/06/1995 Today's Date: 04/29/2016    History of Present Illness this 21 y.o. female admitted with opoid overdose after being found unresponsive next to boyfriend who was deceased.   She was intubated 3/10-3/14, but failed extubation and was reintubated.  Trach placed 3/19.  She has had periods of agitation with acute encephalopathy.  MRI of brain was negative for acute event.  PMH:  opoid abuse   Clinical Impression   Pt admitted with above. She demonstrates the below listed deficits and will benefit from continued OT to maximize safety and independence with BADLs.  Pt presents to OT with generalized weakness, decreased activity tolerance, generalized weakness, impaired balance, impaired cognition.  She requires max - total A for ADLs and max A +2 for functional mobility.  Family will be providing 24 hour assist at discharge and plans for her to eventually go to drug treatment/rehab.  Feel she will benefit from CIR to allow her perform ADLs and functional transfers with min A - mod I to allow her to return home safely with family and eventually to drug treatment/rehab.  Will follow acutely.       Follow Up Recommendations  CIR;Supervision/Assistance - 24 hour    Equipment Recommendations  3 in 1 bedside commode    Recommendations for Other Services Rehab consult     Precautions / Restrictions Precautions Precautions: Fall;Other (comment) (trach )      Mobility Bed Mobility Overal bed mobility: Needs Assistance Bed Mobility: Supine to Sit     Supine to sit: Max assist     General bed mobility comments: Pt able to assist with lifting trunk from bed   Transfers Overall transfer level: Needs assistance Equipment used: 1 person hand held assist Transfers: Sit to/from [employer] Transfers Sit to Stand: Mod assist Stand pivot transfers: Max assist;+2 physical assistance        General transfer comment: Pt stands with max prompting and assist to lift hips and extend trunk.  Knees buckl mid transfer requiring max A +2 to safely move to chair     Balance Overall balance assessment: Needs assistance Sitting-balance support: Feet supported;Single extremity supported Sitting balance-Leahy Scale: Poor Sitting balance - Comments: Pt leaning all directions with poor trunk control.  requires min guard to max A for balance  Postural control: Posterior lean;Right lateral lean;Left lateral lean Standing balance support: Bilateral upper extremity supported Standing balance-Leahy Scale: Poor Standing balance comment: requires mod - max A                             ADL Overall ADL's : Needs assistance/impaired Eating/Feeding: NPO   Grooming: Wash/dry hands;Wash/dry face;Oral care;Brushing hair;Maximal assistance;Sitting Grooming Details (indicate cue type and reason): requires assist to maintain attention to task  Upper Body Bathing: Total assistance;Sitting   Lower Body Bathing: Total assistance;Sit to/from stand   Upper Body Dressing : Total assistance;Sitting   Lower Body Dressing: Total assistance;Sit to/from stand   Toilet Transfer: Maximal assistance;+2 for physical assistance;Stand-pivot;BSC Toilet Transfer Details (indicate cue type and reason): knees buckle mid transfer  Toileting- Clothing Manipulation and Hygiene: Total assistance;Sit to/from stand       Functional mobility during ADLs: Maximal assistance;+2 for physical assistance General ADL Comments: step mom and dad present.  Pt unable to sustain attention to fully engage in ADL tasks      Vision   Additional Comments: unable to  accurately assess      Perception Perception Perception Tested?: No   Praxis Praxis Praxis tested?: Deficits Deficits: Motor Impersistence    Pertinent Vitals/Pain Pain Assessment: Faces Faces Pain Scale: No hurt     Hand Dominance Right    Extremity/Trunk Assessment Upper Extremity Assessment Upper Extremity Assessment: Generalized weakness   Lower Extremity Assessment Lower Extremity Assessment: Defer to PT evaluation   Cervical / Trunk Assessment Cervical / Trunk Assessment: Other exceptions Cervical / Trunk Exceptions: trunk weakness    Communication Communication Communication: Tracheostomy   Cognition Arousal/Alertness: Awake/alert Behavior During Therapy: Restless;Flat affect Overall Cognitive Status: Impaired/Different from baseline Area of Impairment: Orientation;Attention;Following commands;Safety/judgement;Problem solving Orientation Level: Disoriented to;Situation Current Attention Level: Sustained;Focused   Following Commands: Follows one step commands inconsistently;Follows one step commands with increased time Safety/Judgement: Decreased awareness of safety;Decreased awareness of deficits   Problem Solving: Slow processing;Decreased initiation;Difficulty sequencing;Requires verbal cues;Requires tactile cues General Comments: Pt requires mod multimodal commands to follow simple commands.  pt is easily distracted.  She knows she is in hospital, March, 2018   General Comments  Family plans for pt to eventually admit to drug treatment/rehab     Exercises       Shoulder Instructions      Home Living Family/patient expects to be discharged to:: Private residence Living Arrangements: Alone Available Help at Discharge: Family;Available 24 hours/day Type of Home: House Home Access: Stairs to enter [employer] of Steps: 5 at her dad's home; 1 step into her apartment    Home Layout: One level     Bathroom Shower/Tub: Tub/shower unit;Walk-in shower   Bathroom Toilet: Standard     Home Equipment: None   Additional Comments: Pt will either discharge to her apt with her dad staying with her, or she may discharge to her dad and stepmom's home.  She will have 24/7 asssit       Prior  Functioning/Environment Level of Independence: Independent                 OT Problem List: Decreased strength;Decreased activity tolerance;Impaired balance (sitting and/or standing);Decreased coordination;Decreased cognition;Decreased safety awareness;Decreased knowledge of use of DME or AE;Cardiopulmonary status limiting activity      OT Treatment/Interventions: Self-care/ADL training;Therapeutic exercise;Neuromuscular education;DME and/or AE instruction;Therapeutic activities;Cognitive remediation/compensation;Patient/family education;Balance training    OT Goals(Current goals can be found in the care plan section) Acute Rehab OT Goals Patient Stated Goal: unable  OT Goal Formulation: Patient unable to participate in goal setting Time For Goal Achievement: 05/13/16 Potential to Achieve Goals: Good  OT Frequency: Min 3X/week   Barriers to D/C:            Co-evaluation              End of Session Equipment Utilized During Treatment: Gait belt;Oxygen Nurse Communication: Mobility status  Activity Tolerance: Patient limited by fatigue Patient left: in chair;with call bell/phone within reach;with chair alarm set;with family/visitor present  OT Visit Diagnosis: Unsteadiness on feet (R26.81);Muscle weakness (generalized) (M62.81);Cognitive communication deficit (R41.841)                ADL either performed or assessed with clinical judgement  Time: 1237-1307 OT Time Calculation (min): 30 min Charges:  OT General Charges $OT Visit: 1 Procedure OT Evaluation $OT Eval Moderate Complexity: 1 Procedure OT Treatments $Therapeutic Activity: 8-22 mins G-Codes:     [employer], OTR/L [phone]   Kemiah Booz M 04/29/2016, 2:02 PM

## 2016-04-29 NOTE — Consult Note (Signed)
Physical Medicine and Rehabilitation Consult Reason for Consult: Acute encephalopathy Referring Physician: Dr. Tyson AliasFeinstein   HPI: Jane Watkins is a 21 y.o. right handed female with history of opiate abuse. Per chart review patient lived alone and was independent prior to admission. Current plan is to discharge to her apartment with her father staying with her and providing assistance or she may discharge to her father and stepmom's home who can provide care. Presented 04/19/2016 after being found next to her boyfriend who was deceased. She was unresponsive. She was brought initially to [facility] combative requiring intubation. Her CK was 15,000 with mild lactic acidosis. Cranial CT scan negative. Follow-up MRI of the brain unremarkable 04/22/2016. UDS positive for opiates. EEG consistent with severe nonspecific diffuse cerebral dysfunction with no seizure activity. Maintain on ventilatory support with failed extubation and ultimately with percutaneous tracheostomy placement 04/28/2016. Attempts to place a nasogastric tube was unsuccessful currently on IV fluids for hydration. MRSA PCR screen positive maintained on contact precautions. Subcutaneous Lovenox for DVT prophylaxis. Physical and Occupational therapy evaluations completed  with recommendations of physical medicine rehabilitation consult.  Discussed with speech therapy, tolerated ice chips, modified barium swallow this afternoon. Discussed with pulmonology, vocal cord edema noted during intubation. He does not think tracheostomy will be removed in the near term  Review of Systems  Unable to perform ROS: Acuity of condition   History reviewed. No pertinent past medical history. Past Surgical History:  Procedure Laterality Date  . APPENDECTOMY    . MOUTH SURGERY     Family History  Problem Relation Age of Onset  . Anemia Father    Social History:  reports that she has been smoking Cigarettes.  She has a 4.00 pack-year  smoking history. She has never used smokeless tobacco. She reports that she does not drink alcohol or use drugs. Allergies:  Allergies  Allergen Reactions  . Other Rash    Tide detergent   . Sulfa Antibiotics Rash   Medications Prior to Admission  Medication Sig Dispense Refill  . ranitidine (ZANTAC) 150 MG tablet Take 150 mg by mouth daily as needed for heartburn.    Marland Kitchen. FLUoxetine (PROZAC) 20 MG tablet Take 30 mg by mouth daily.    Marland Kitchen. HYDROcodone-acetaminophen (NORCO) 7.5-325 MG tablet Take 1 tablet by mouth daily as needed for moderate pain.    . [DISCONTINUED] dicyclomine (BENTYL) 20 MG tablet Take 1 tablet (20 mg total) by mouth 2 (two) times daily. (Patient not taking: Reported on 04/20/2016) 20 tablet 0  . [DISCONTINUED] ibuprofen (ADVIL,MOTRIN) 600 MG tablet Take 1 tablet (600 mg total) by mouth every 6 (six) hours as needed for moderate pain. (Patient not taking: Reported on 04/20/2016) 20 tablet 0  . [DISCONTINUED] Lactobacillus (ACIDOPHILUS PROBIOTIC) 10 MG TABS Take 10 mg by mouth 3 (three) times daily. (Patient not taking: Reported on 04/20/2016) 30 tablet 0  . [DISCONTINUED] metroNIDAZOLE (FLAGYL) 500 MG tablet Take 1 tablet (500 mg total) by mouth 2 (two) times daily. (Patient not taking: Reported on 04/20/2016) 14 tablet 0  . [DISCONTINUED] ondansetron (ZOFRAN ODT) 4 MG disintegrating tablet Take 1 tablet (4 mg total) by mouth every 8 (eight) hours as needed for nausea or vomiting. (Patient not taking: Reported on 04/20/2016) 10 tablet 0  . [DISCONTINUED] predniSONE (DELTASONE) 20 MG tablet Take 2 tablets (40 mg total) by mouth daily with breakfast. (Patient not taking: Reported on 04/20/2016) 10 tablet 0    Home: Home Living Family/patient expects to be  discharged to:: Private residence Living Arrangements: Alone Available Help at Discharge: Family, Available 24 hours/day Type of Home: House Home Access: Stairs to enter [employer] of Steps: 5 at her dad's home; 1 step  into her apartment  Home Layout: One level Bathroom Shower/Tub: Tub/shower unit, Health visitor: Standard Home Equipment: None Additional Comments: Pt will either discharge to her apt with her dad staying with her, or she may discharge to her dad and stepmom's home. She will have 24/7 assist.   Functional History: Prior Function Level of Independence: Independent Functional Status:  Mobility: Bed Mobility Overal bed mobility: Needs Assistance Bed Mobility: Supine to Sit Supine to sit: Max assist General bed mobility comments: Pt able to assist with lifting trunk from bed  Transfers Overall transfer level: Needs assistance Equipment used: 1 person hand held assist Transfers: Sit to/from Stand, Stand Pivot Transfers Sit to Stand: Mod assist Stand pivot transfers: Max assist, +2 physical assistance General transfer comment: Pt stands with max prompting and assist to lift hips and extend trunk.  Knees buckl mid transfer requiring max A +2 to safely move to chair       ADL: ADL Overall ADL's : Needs assistance/impaired Eating/Feeding: NPO Grooming: Wash/dry hands, Wash/dry face, Oral care, Brushing hair, Maximal assistance, Sitting Grooming Details (indicate cue type and reason): requires assist to maintain attention to task  Upper Body Bathing: Total assistance, Sitting Lower Body Bathing: Total assistance, Sit to/from stand Upper Body Dressing : Total assistance, Sitting Lower Body Dressing: Total assistance, Sit to/from stand Toilet Transfer: Maximal assistance, +2 for physical assistance, Stand-pivot, BSC Toilet Transfer Details (indicate cue type and reason): knees buckle mid transfer  Toileting- Clothing Manipulation and Hygiene: Total assistance, Sit to/from stand Functional mobility during ADLs: Maximal assistance, +2 for physical assistance General ADL Comments: step mom and dad present.  Pt unable to sustain attention to fully engage in ADL tasks    Cognition: Cognition Overall Cognitive Status: Impaired/Different from baseline Orientation Level: Intubated/Tracheostomy - Unable to assess Cognition Arousal/Alertness: Awake/alert Behavior During Therapy: Restless, Flat affect Overall Cognitive Status: Impaired/Different from baseline Area of Impairment: Attention, Following commands, Safety/judgement, Problem solving Orientation Level: Disoriented to, Situation Current Attention Level: Focused Following Commands: Follows one step commands inconsistently, Follows one step commands with increased time Safety/Judgement: Decreased awareness of safety, Decreased awareness of deficits Problem Solving: Slow processing, Decreased initiation, Difficulty sequencing, Requires verbal cues, Requires tactile cues General Comments: Pt requires mod multimodal commands to follow simple commands.  pt is easily distracted.  She knows she is in hospital, March, 2018  Blood pressure 123/70, pulse (!) 129, temperature 97.9 F (36.6 C), temperature source Axillary, resp. rate (!) 23, height 5\' 4"  (1.626 m), weight 55 kg (121 lb 4.1 oz), SpO2 94 %. Physical Exam  HENT:  Head: Normocephalic.  Eyes:  Pupils round and reactive to light  Neck:  Tracheostomy tube in place  Cardiovascular: Normal rate and regular rhythm.   Respiratory:  Limited inspiratory effort but clear to auscultation  GI: Soft. Bowel sounds are normal. She exhibits no distension.  Neurological:  Lethargic but arousable. She would make eye contact with examiner. Inconsistent to follow one-step commands. She was nonverbal  Per speech therapy, who is just finished working with the patient. She was oriented to person, place. Patient started becoming fatigued during the end of the session. She mouthes words. Strong cough through trach Motor exam limited by participation. She has 5/5. Upper limb strength at the deltoid, bicep, triceps grip.  Lower limb does not give full effort is briefly  able to lift both legs antigravity. Withdraws to pinch in both lower limbs. Tone without evidence of hyperreflexia in the lower limbs. No evidence of ataxia with finger nose finger testing Results for orders placed or performed during the hospital encounter of 04/19/16 (from the past 24 hour(s))  Glucose, capillary     Status: None   Collection Time: 04/28/16  3:54 PM  Result Value Ref Range   Glucose-Capillary 83 65 - 99 mg/dL  Glucose, capillary     Status: None   Collection Time: 04/28/16  8:31 PM  Result Value Ref Range   Glucose-Capillary 74 65 - 99 mg/dL   Comment 1 Notify RN    Comment 2 Document in Chart   Glucose, capillary     Status: None   Collection Time: 04/29/16 12:52 AM  Result Value Ref Range   Glucose-Capillary 84 65 - 99 mg/dL  Glucose, capillary     Status: None   Collection Time: 04/29/16  4:19 AM  Result Value Ref Range   Glucose-Capillary 86 65 - 99 mg/dL   Comment 1 Notify RN    Comment 2 Document in Chart   Basic metabolic panel     Status: Abnormal   Collection Time: 04/29/16  5:00 AM  Result Value Ref Range   Sodium 136 135 - 145 mmol/L   Potassium 3.5 3.5 - 5.1 mmol/L   Chloride 100 (L) 101 - 111 mmol/L   CO2 24 22 - 32 mmol/L   Glucose, Bld 85 65 - 99 mg/dL   BUN 12 6 - 20 mg/dL   Creatinine, Ser 1.61 0.44 - 1.00 mg/dL   Calcium 9.2 8.9 - 09.6 mg/dL   GFR calc non Af Amer >60 >60 mL/min   GFR calc Af Amer >60 >60 mL/min   Anion gap 12 5 - 15  Glucose, capillary     Status: None   Collection Time: 04/29/16  8:22 AM  Result Value Ref Range   Glucose-Capillary 75 65 - 99 mg/dL  Glucose, capillary     Status: None   Collection Time: 04/29/16 11:41 AM  Result Value Ref Range   Glucose-Capillary 91 65 - 99 mg/dL   Comment 1 Notify RN    Comment 2 Document in Chart    Dg Chest Port 1 View  Result Date: 04/29/2016 CLINICAL DATA:  Acute respiratory failure. EXAM: PORTABLE CHEST 1 VIEW COMPARISON:  04/28/2016. FINDINGS: Tracheostomy tube and  right PICC line stable position. Heart size normal. Interim near complete clearing of bibasilar atelectasis and infiltrates. Mild residual in the right base. No pleural effusion or pneumothorax . IMPRESSION: 1. Tracheostomy tube and right PICC line stable position. 2. Interim near complete clearing of bibasilar atelectasis and infiltrates. Mild residual in the right base . Electronically Signed   By: Maisie Fus  Register   On: 04/29/2016 07:57   Dg Chest Port 1 View  Result Date: 04/28/2016 CLINICAL DATA:  Acute respiratory failure. EXAM: PORTABLE CHEST 1 VIEW COMPARISON:  04/28/2016. FINDINGS: Interval extubation with placement of a midline tracheostomy tube. Right PICC tip projects over the low SVC. Heart size normal. New bibasilar airspace opacification, right greater than left. No pleural fluid. IMPRESSION: New bibasilar airspace opacification, right greater than left, may be due to atelectasis or aspiration. Electronically Signed   By: Leanna Battles M.D.   On: 04/28/2016 14:30   Dg Chest Port 1 View  Result Date: 04/28/2016 CLINICAL DATA:  Hypoxia EXAM:  PORTABLE CHEST 1 VIEW COMPARISON:  April 27, 2016 FINDINGS: Endotracheal tube tip is 1.6 cm above the carina. Nasogastric tube tip and side port below the diaphragm. Central catheter tip is in the superior vena cava. No pneumothorax. No edema or consolidation. Heart size and pulmonary vascularity are normal. No adenopathy. No bone lesions. IMPRESSION: Tube and catheter positions as described without pneumothorax. No edema or consolidation. Interval clearing of opacity from the left perihilar region. Electronically Signed   By: Bretta Bang III M.D.   On: 04/28/2016 07:11   Dg Abd Portable 1v  Result Date: 04/27/2016 CLINICAL DATA:  21 year old female with enteric tube placement. EXAM: PORTABLE ABDOMEN - 1 VIEW COMPARISON:  Lumbar spine radiograph dated 12/16/2015 and chest radiograph dated 04/27/2016 FINDINGS: Partially visualized enteric tube  with tip in the left upper abdomen likely in the proximal stomach. The side port of the enteric tube is in the region of the gastroesophageal junction. Recommend advancing of the tube further into the stomach. There is no bowel dilatation or evidence of obstruction. Air is noted within the colon. No free air or radiopaque calculi identified on the provided image. The osseous structures and soft tissues are grossly unremarkable. IMPRESSION: Enteric tube with tip in the proximal stomach and side port likely in the region of the gastroesophageal junction. Recommend advancing the tube further into the stomach. Electronically Signed   By: Elgie Collard M.D.   On: 04/27/2016 22:20    Assessment/Plan: Diagnosis: Hypoxic encephalopathy with cognitive deficits, balance disorder 1. Does the need for close, 24 hr/day medical supervision in concert with the patient's rehab needs make it unreasonable for this patient to be served in a less intensive setting? Yes 2. Co-Morbidities requiring supervision/potential complications: Opiate abuse, laryngeal edema 3. Due to bladder management, bowel management, safety, skin/wound care, disease management, medication administration, pain management and patient education, does the patient require 24 hr/day rehab nursing? Yes 4. Does the patient require coordinated care of a physician, rehab nurse, PT (1-2 hrs/day, 5 days/week), OT (1-2 hrs/day, 5 days/week) and SLP (.5 hrs/day, 1 days/week) to address physical and functional deficits in the context of the above medical diagnosis(es)? Yes Addressing deficits in the following areas: balance, endurance, locomotion, strength, transferring, bowel/bladder control, bathing, dressing, feeding, grooming, toileting, cognition, speech, language, swallowing and psychosocial support 5. Can the patient actively participate in an intensive therapy program of at least 3 hrs of therapy per day at least 5 days per week? Yes 6. The potential  for patient to make measurable gains while on inpatient rehab is excellent 7. Anticipated functional outcomes upon discharge from inpatient rehab are modified independent  with PT, supervision with OT, supervision with SLP. 8. Estimated rehab length of stay to reach the above functional goals is: 12-16d 9. Does the patient have adequate social supports and living environment to accommodate these discharge functional goals? Yes 10. Anticipated D/C setting: Home 11. Anticipated post D/C treatments: HH therapy 12. Overall Rehab/Functional Prognosis: good  RECOMMENDATIONS: This patient's condition is appropriate for continued rehabilitative care in the following setting: CIR Patient has agreed to participate in recommended program. N/A Note that insurance prior authorization may be required for reimbursement for recommended care.  Comment: Scheduled for modified barium swallow today   ANGIULLI,DANIEL J., PA-C 04/29/2016

## 2016-04-29 NOTE — Evaluation (Signed)
Physical Therapy Evaluation Patient Details Name: Jane Watkins MRN: [id number] DOB: 10/04/1995 Today's Date: 04/29/2016   History of Present Illness  Pt is a 21 y.o. female admitted with opoid overdose after being found unresponsive next to boyfriend who was deceased. Intubated 3/10-3/14, but failed extubation and was reintubated. Trach placed 3/19. She has had periods of agitation with acute encephalopathy. Brain MRI negative for acute event. PMH includes opoid abuse.  Clinical Impression  Pt presents to PT with generalized weakness, decreased awareness, and an overall decrease in functional mobility secondary to above. PTA, pt indep and living alone; family will be available 24/7 at d/c. Today, pt able to stand pivot to bed with bilat HHA and maxA; further mobility limited by fatigue. Pt would benefit from continued acute PT services to maximize functional mobility and independence.     Follow Up Recommendations CIR;Supervision/Assistance - 24 hour    Equipment Recommendations  Other (comment) (Defer to next venue)    Recommendations for Other Services Rehab consult     Precautions / Restrictions Precautions Precautions: Fall;Other (comment) Precaution Comments: Trach Restrictions Weight Bearing Restrictions: No      Mobility  Bed Mobility Overal bed mobility: Needs Assistance Bed Mobility: Sit to Supine            Transfers Overall transfer level: Needs assistance Equipment used: 1 person hand held assist Transfers: Sit to/from Stand;Stand Pivot Transfers Sit to Stand: Max assist Stand pivot transfers: Max assist       General transfer comment: Reliant on BUE support for standing. Locked bilat knees into extension for stand pivot with maxA.   Ambulation/Gait                Stairs            Wheelchair Mobility    Modified Rankin (Stroke Patients Only)       Balance Overall balance assessment: Needs assistance Sitting-balance support: No  upper extremity supported;Feet supported Sitting balance-Leahy Scale: Fair Sitting balance - Comments: Pt able to maintain upright sitting intermittently without support, leaning in all directions with poor trunk control overall.  Postural control: Posterior lean;Right lateral lean;Left lateral lean Standing balance support: Bilateral upper extremity supported Standing balance-Leahy Scale: Poor Standing balance comment: requires mod - max A                              Pertinent Vitals/Pain Pain Assessment: Faces Faces Pain Scale: No hurt    Home Living Family/patient expects to be discharged to:: Private residence Living Arrangements: Alone Available Help at Discharge: Family;Available 24 hours/day Type of Home: House Home Access: Stairs to enter   [employer] Stairs-Number of Steps: 5 at her dad's home; 1 step into her apartment  Home Layout: One level Home Equipment: None Additional Comments: Pt will either discharge to her apt with her dad staying with her, or she may discharge to her dad and stepmom's home. She will have 24/7 assist.     Prior Function Level of Independence: Independent               Hand Dominance   Dominant Hand: Right    Extremity/Trunk Assessment   Upper Extremity Assessment Upper Extremity Assessment: Defer to OT evaluation    Lower Extremity Assessment Lower Extremity Assessment: Generalized weakness    Cervical / Trunk Assessment Cervical / Trunk Assessment: Other exceptions Cervical / Trunk Exceptions: Trunk weakness  Communication   Communication: Tracheostomy  Cognition  Arousal/Alertness: Awake/alert Behavior During Therapy: Restless;Flat affect Overall Cognitive Status: Impaired/Different from baseline Area of Impairment: Attention;Following commands;Safety/judgement;Problem solving Current Attention Level: Focused   Following Commands: Follows one step commands inconsistently;Follows one step commands with increased  time Safety/Judgement: Decreased awareness of safety;Decreased awareness of deficits   Problem Solving: Slow processing;Decreased initiation;Difficulty sequencing;Requires verbal cues;Requires tactile cues    General Comments General comments (skin integrity, edema, etc.): HR up to 140s.     Exercises     Assessment/Plan    PT Assessment Patient needs continued PT services  PT Problem List Decreased strength;Decreased mobility;Decreased activity tolerance;Decreased cognition;Decreased knowledge of use of DME;Decreased balance;Cardiopulmonary status limiting activity;Decreased knowledge of precautions;Decreased safety awareness       PT Treatment Interventions DME instruction;Therapeutic activities;Therapeutic exercise;Gait training;Stair training;Balance training;Neuromuscular re-education;Functional mobility training;Patient/family education    PT Goals (Current goals can be found in the Care Plan section)  Acute Rehab PT Goals Patient Stated Goal: unable  PT Goal Formulation: Patient unable to participate in goal setting Time For Goal Achievement: 05/13/16    Frequency Min 3X/week   Barriers to discharge        Co-evaluation               End of Session Equipment Utilized During Treatment: Gait belt Activity Tolerance: Patient limited by fatigue Patient left: in bed;with bed alarm set;with family/visitor present;with call bell/phone within reach Nurse Communication: Mobility status PT Visit Diagnosis: Muscle weakness (generalized) (M62.81);Difficulty in walking, not elsewhere classified (R26.2)         Time: 9604-5409 PT Time Calculation (min) (ACUTE ONLY): 12 min   Charges:   PT Evaluation $PT Eval Low Complexity: 1 Procedure     PT G Codes:       Dewayne Hatch, SPT [phone]  Ina Homes 04/29/2016, 3:34 PM

## 2016-04-29 NOTE — Progress Notes (Signed)
This note also relates to the following rows which could not be included: BP - Cannot attach notes to unvalidated device data  Titrated FIO2 to 30%, sat 100%.

## 2016-04-29 NOTE — Progress Notes (Signed)
Cortrak feeding tube consult received and tube placement attempted but unsuccessful. Discussed with LawyerHeather RN.   Romelle Starcherate Bannie Lobban MS, RD, LDN [phone] Pager  [phone] Weekend/On-Call Pager

## 2016-04-29 NOTE — Progress Notes (Signed)
This note also relates to the following rows which could not be included: Pulse Rate - Cannot attach notes to unvalidated device data Resp - Cannot attach notes to unvalidated device data SpO2 - Cannot attach notes to unvalidated device data  Titrated to 6L, 28% on ATC, sat 96%.

## 2016-04-29 NOTE — Clinical Social Work Note (Signed)
Clinical Social Work Assessment  Patient Details  Name: Jane Watkins MRN: [id number] Date of Birth: [birth year]  Date of referral:  04/29/16               Reason for consult:  Substance Use/ETOH Abuse, Trauma, Grief and Loss                Permission sought to share information with:  Case Manager, Family Supports Permission granted to share information::  No  Name::     Jane Watkins (Father) [phone]   Agency::     Relationship::     Contact Information:     Housing/Transportation Living arrangements for the past 2 months:  Apartment Source of Information:  Medical Team, Sports coach, Parent Patient Interpreter Needed:  None Criminal Activity/Legal Involvement Pertinent to Current Situation/Hospitalization:  No - Comment as needed Significant Relationships:  Parents, Other Family Members, Siblings Lives with:  Self, Spouse Do you feel safe going back to the place where you live?  No Need for family participation in patient care:  Yes (Comment)  Care giving concerns:  Patient with history of opiate abuse. New trach. Will likely need SNF vs CIR at discharge from hospital.    Social Worker assessment / plan: 21 y/o F with a history of opiate abuse who was found next to her boyfriend (who was deceased). She was unresponsive. She was brought to [facility] where she was found to combative and was intubated for airway protection, concern for aspiration.   CSW engaged with Patient's father and stepmother at Patient's bedside. CSW and family then moved to waiting room. CSW introduced self, role of CSW, and provided emotional support and supportive counseling. Patient's family reports that Patient had been clean for 6 months prior to recent relapse. During this time, Patient's family notes that Patient's boyfriend (whom she told them was her ex-boyfriend) was in jail. Patient has never been to residential substance abuse treatment but family notes that she was going to a outpatient SA  treatment program where she was prescribed suboxone. Patient's family reports that Patient was eventually able to wean off of the suboxone. 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 Patient's father reports that he had also agreed to [place] an apartment for Patient. Patient's family identifies a lot of familial support systems for Patient including Patient's mother who lives in [place], [place]. Family notes that (ex)boyfriend, who is now deceased, was a negative influence on Patient and notes that he was recently released from jail and had unbeknownst of them, moved into the apartment with Patient. Patient's family reports Patient had also pressed charges against Patient's (ex) boyfriend in the past for assault and destruction of property.   Patient's father inquired about the process for obtaining temporary guardianship for Patient in order to maintain Patient's current financial responsibilities. CSW discussed this process with family. Family also inquired about residential substance abuse treatment facilities. CSW to provide Patient's family with resources. CSW discussed in depth the stages of change and Patient's need and desire to be ready for change. Family believes that this hospitalization and the events that occurred prior to will be enough for Patient to want to change. Patient's family also requesting Patient be evaluated by psychiatry once medically stable. CSW also discussed the possibility of needing physical rehabilitation at discharge. Family agreeable.   Patient's family reports that they have not yet told Patient that her boyfriend  is deceased. Patient's father reports concern for Patient's anxiety and Patient's uncertainty of what Patient remembers. CSW informed family that she is available to assist with facilitating that conversation along with chaplain if family would like once they are ready. CSW provided  family with CSW contact information. CSW will continue to follow for support.   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   Patient/Family's Understanding of and Emotional Response to Diagnosis, Current Treatment, and Prognosis:  Patient's family able to verbalize strong understanding of diagnosis, current treatment, and prognosis.   Emotional Assessment Appearance:  Appears stated age Attitude/Demeanor/Rapport:  Unable to Assess Affect (typically observed):  Unable to Assess Orientation:    Alcohol / Substance use:  Illicit Drugs Psych involvement (Current and /or in the community):  No (Comment)  Discharge Needs  Concerns to be addressed:  Discharge Planning Concerns, Grief and Loss Concerns, Substance Abuse Concerns, Care Coordination Readmission within the last 30 days:  No Current discharge risk:  Substance Abuse Barriers to Discharge:  Continued Medical Work up, Active Substance Use   Lew Dawesshley N Ellyssa Zagal, LCSW 04/29/2016, 11:58 AM

## 2016-04-29 NOTE — Progress Notes (Signed)
eLink Physician-Brief Progress Note Patient Name: Jane CarinaLydia Skoglund DOB: 08/27/1995 MRN: [id number]   Date of Service  04/29/2016  HPI/Events of Note  Unable to place NGT  eICU Interventions  Maintenance IVFs ordered     Intervention Category Intermediate Interventions: Other:  Merwyn KatosDavid B Niema Carrara 04/29/2016, 4:54 PM

## 2016-04-30 ENCOUNTER — Inpatient Hospital Stay (HOSPITAL_COMMUNITY): Payer: 59

## 2016-04-30 DIAGNOSIS — R0600 Dyspnea, unspecified: Secondary | ICD-10-CM

## 2016-04-30 DIAGNOSIS — R0603 Acute respiratory distress: Secondary | ICD-10-CM

## 2016-04-30 LAB — CBC
HCT: 33.9 % — ABNORMAL LOW (ref 36.0–46.0)
HEMOGLOBIN: 11.5 g/dL — AB (ref 12.0–15.0)
MCH: 29.4 pg (ref 26.0–34.0)
MCHC: 33.9 g/dL (ref 30.0–36.0)
MCV: 86.7 fL (ref 78.0–100.0)
Platelets: 410 10*3/uL — ABNORMAL HIGH (ref 150–400)
RBC: 3.91 MIL/uL (ref 3.87–5.11)
RDW: 12.6 % (ref 11.5–15.5)
WBC: 21.2 10*3/uL — AB (ref 4.0–10.5)

## 2016-04-30 LAB — BASIC METABOLIC PANEL
ANION GAP: 11 (ref 5–15)
BUN: 13 mg/dL (ref 6–20)
CHLORIDE: 104 mmol/L (ref 101–111)
CO2: 21 mmol/L — ABNORMAL LOW (ref 22–32)
Calcium: 9.1 mg/dL (ref 8.9–10.3)
Creatinine, Ser: 0.34 mg/dL — ABNORMAL LOW (ref 0.44–1.00)
GFR calc non Af Amer: >60 mL/min (ref >60)
Glucose, Bld: 112 mg/dL — ABNORMAL HIGH (ref 65–99)
Potassium: 3.8 mmol/L (ref 3.5–5.1)
SODIUM: 136 mmol/L (ref 135–145)

## 2016-04-30 LAB — GLUCOSE, CAPILLARY
GLUCOSE-CAPILLARY: 118 mg/dL — AB (ref 65–99)
GLUCOSE-CAPILLARY: 103 mg/dL — AB (ref 65–99)
GLUCOSE-CAPILLARY: 90 mg/dL (ref 65–99)
Glucose-Capillary: 107 mg/dL — ABNORMAL HIGH (ref 65–99)
Glucose-Capillary: 107 mg/dL — ABNORMAL HIGH (ref 65–99)

## 2016-04-30 LAB — PHOSPHORUS: PHOSPHORUS: 3.2 mg/dL (ref 2.5–4.6)

## 2016-04-30 LAB — MAGNESIUM: MAGNESIUM: 2 mg/dL (ref 1.7–2.4)

## 2016-04-30 MED ORDER — RESOURCE THICKENUP CLEAR PO POWD
ORAL | Status: DC | PRN
  Filled 2016-04-30: qty 125

## 2016-04-30 NOTE — Evaluation (Signed)
Speech Language Pathology Evaluation Patient Details Name: Jane CarinaLydia Skoglund MRN: [id number] DOB: 01/17/1996 Today's Date: 04/30/2016 Time: [id number] SLP Time Calculation (min) (ACUTE ONLY): 11 min  Problem List:  Patient Active Problem List   Diagnosis Date Noted  . Glasgow coma scale total score 3-8 (HCC)   . Pneumonia of both lower lobes due to methicillin resistant Staphylococcus aureus (MRSA) (HCC)   . Acute pulmonary edema (HCC)   . Non-traumatic rhabdomyolysis   . Opioid abuse   . Elevated troponin   . Acute respiratory failure with hypoxia (HCC)   . Drug overdose   . Elevated liver enzymes   . Encephalopathy acute   . Encephalopathy 04/19/2016  . Opiate overdose 04/19/2016   Past Medical History: History reviewed. No pertinent past medical history. Past Surgical History:  Past Surgical History:  Procedure Laterality Date  . APPENDECTOMY    . MOUTH SURGERY     HPI:  21 y/o F with a history of opiate abuse who was found unresponsive next to her boyfriend (who was deceased). She was intubated 3/10-3/14 but failed extubation due to stridor/swelling and was reintubated 3/14 until trach 3/19.    Assessment / Plan / Recommendation Clinical Impression  Pt responds to simple questions and commands with delayed responses but good accuracy. She used the calendar on her wall with Mod I to reorient herself to time, although she does need additional cues for recall of daily events. Her verbal expression is limited due to her inability to tolerate the PMV, but she used a complex communication board with pictures and words with Min cues. Father present was also instructed on use of communication board. Will continue to follow to assess higher levels of cognition and communication as pt progresses.    SLP Assessment  SLP Recommendation/Assessment: Patient needs continued Speech Lanaguage Pathology Services SLP Visit Diagnosis: Cognitive communication deficit (R41.841)    Follow Up  Recommendations  Inpatient Rehab    Frequency and Duration min 2x/week  2 weeks      SLP Evaluation Cognition  Overall Cognitive Status: Impaired/Different from baseline Arousal/Alertness: Awake/alert Orientation Level: Oriented to person;Oriented to place;Oriented to time Attention: Sustained Sustained Attention: Appears intact Memory: Impaired Memory Impairment: Decreased recall of new information (uses compensatory strategies) Behaviors: Impulsive Safety/Judgment: Impaired       Comprehension  Auditory Comprehension Overall Auditory Comprehension: Impaired Commands:  (follows commands but with delay)    Expression Expression Primary Mode of Expression: Other (comment) (mouths/gestures/writes  due to trach) Verbal Expression Overall Verbal Expression: Impaired (limited assessment due to trach) Non-Verbal Means of Communication: Communication board   Oral / Motor  Oral Motor/Sensory Function Overall Oral Motor/Sensory Function: Within functional limits Motor Speech Overall Motor Speech: Other (comment) (aphonic secondary to trach) Intelligibility: Unable to assess (comment)   GO                    Maxcine Hamaiewonsky, [place] Voytko 04/30/2016, 10:28 AM  Maxcine HamLaura Paiewonsky, M.A. CCC-SLP [phone]

## 2016-04-30 NOTE — Progress Notes (Signed)
  Physical Therapy Treatment Patient Details Name: Laqueta CarinaLydia Skoglund MRN: [id number] DOB: 02/19/1995 Today's Date: 04/30/2016    History of Present Illness      PT Comments    Pt slowly progressing with mobility, able to perform bed mob with min guard and stand pivot with bilat HHA and modA. Remains easily distracted, requiring multimodal cues to redirect to task; HR up to 130s with mobility. Appropriately responding to questions, able to communicate by mouthing words with intermittent soft vocalizations. Will continue to follow acutely.    Follow Up Recommendations        Equipment Recommendations       Recommendations for Other Services       Precautions / Restrictions      Mobility  Bed Mobility                  Transfers                    Ambulation/Gait                 Stairs            Wheelchair Mobility    Modified Rankin (Stroke Patients Only)       Balance                                    Cognition                            Exercises      General Comments        Pertinent Vitals/Pain Pain Assessment: No/denies pain    Home Living                      Prior Function            PT Goals (current goals can now be found in the care plan section)      Frequency           PT Plan      Co-evaluation             End of Session               Time:  -     Charges:  $Therapeutic Activity: 8-22 mins                    G Codes:      Dewayne HatchJaclyn Leigh Bowden Boody, SPT Office-[phone]  Ina HomesJaclyn Savvas Roper 04/30/2016, 3:06 PM

## 2016-04-30 NOTE — Progress Notes (Signed)
  Speech Language Pathology Treatment: Jane Watkins Speaking valve  Patient Details Name: Jane Watkins MRN: [id number] DOB: 03/22/1995 Today's Date: 04/30/2016 Time: [id number] SLP Time Calculation (min) (ACUTE ONLY): 8 min  Assessment / Plan / Recommendation Clinical Impression  Pt still does not show any evidence of upper airway patency with attempts at digital occlusion or PMV placement. Attempted cued coughs but no audible wetness is noted. Continue to suspect that this is largely edema related, but cannot exclude the size of the trach itself. Recommend PMV with SLP only but allowing for cuff deflation as tolerated.   HPI HPI: 21 y/o F with a history of opiate abuse who was found unresponsive next to her boyfriend (who was deceased). She was intubated 3/10-3/14 but failed extubation due to stridor/swelling and was reintubated 3/14 until trach 3/19.       SLP Plan  MBS       Recommendations         Patient may use Passy-Watkins Speech Valve: with SLP only MD: Please consider changing trach tube to : Cuffless;Smaller size (when medically appropriate)         Oral Care Recommendations: Oral care QID Follow up Recommendations: Inpatient Rehab SLP Visit Diagnosis: Aphonia (R49.1) Plan: MBS       GO                Jane Hamaiewonsky, Emie Sommerfeld 04/30/2016, 10:07 AM  Jane Watkins, M.A. CCC-SLP [phone]

## 2016-04-30 NOTE — Progress Notes (Signed)
Pt refused.

## 2016-04-30 NOTE — Evaluation (Signed)
Clinical/Bedside Swallow Evaluation Patient Details  Name: Jane Watkins MRN: [id number] Date of Birth: 02/27/1995  Today's Date: 04/30/2016 Time: SLP Start Time (ACUTE ONLY): 0934 SLP Stop Time (ACUTE ONLY): 0944 SLP Time Calculation (min) (ACUTE ONLY): 10 min  Past Medical History: History reviewed. No pertinent past medical history. Past Surgical History:  Past Surgical History:  Procedure Laterality Date  . APPENDECTOMY    . MOUTH SURGERY     HPI:  21 y/o F with a history of opiate abuse who was found unresponsive next to her boyfriend (who was deceased). She was intubated 3/10-3/14 but failed extubation due to stridor/swelling and was reintubated 3/14 until trach 3/19.    Assessment / Plan / Recommendation Clinical Impression  Pt has signs concerning for aspiration given ice chip trials and has several risk factors for reduced airway protection, including prolonged intubations, edema, generalized deconditioning, and open trach. Safe diet cannot be recommended at bedside; however, Cortrak was not able to be passed through her nare on previous date. Recommend to proceed with instrumental testing to assess for any PO readiness. Will plan for MBS as scope will likely be difficult to pass through her nare as well. SLP Visit Diagnosis: Dysphagia, unspecified (R13.10)    Aspiration Risk  Moderate aspiration risk;Severe aspiration risk    Diet Recommendation NPO   Medication Administration: Via alternative means    Other  Recommendations Oral Care Recommendations: Oral care QID   Follow up Recommendations Inpatient Rehab      Frequency and Duration            Prognosis Prognosis for Safe Diet Advancement: Good      Swallow Study   General HPI: 21 y/o F with a history of opiate abuse who was found unresponsive next to her boyfriend (who was deceased). She was intubated 3/10-3/14 but failed extubation due to stridor/swelling and was reintubated 3/14 until trach 3/19.  Type of  Study: Bedside Swallow Evaluation Previous Swallow Assessment: none in chart Diet Prior to this Study: NPO Temperature Spikes Noted: Yes (100) Respiratory Status: Room air;Trach Trach Size and Type: Cuff;#6;Deflated;With PMSV not in place History of Recent Intubation: Yes Length of Intubations (days): 9 days (across 2 intubations) Date extubated:  (trach 3/19) Behavior/Cognition: Alert;Cooperative Oral Cavity Assessment: Within Functional Limits Oral Care Completed by SLP: Yes Oral Cavity - Dentition: Adequate natural dentition Vision: Functional for self-feeding Patient Positioning: Upright in chair Baseline Vocal Quality: Aphonic (trach, unable to wear PMV) Volitional Cough: Strong;Other (Comment) (given no PMV)    Oral/Motor/Sensory Function Overall Oral Motor/Sensory Function: Within functional limits   Ice Chips Ice chips: Impaired Presentation: Spoon Pharyngeal Phase Impairments: Cough - Delayed   Thin Liquid Thin Liquid: Not tested    Nectar Thick Nectar Thick Liquid: Not tested   Honey Thick Honey Thick Liquid: Not tested   Puree Puree: Not tested   Solid   GO   Solid: Not tested        Maxcine Hamaiewonsky, Yehuda Printup 04/30/2016,10:21 AM  Maxcine HamLaura Paiewonsky, M.A. CCC-SLP [phone]

## 2016-04-30 NOTE — Progress Notes (Signed)
PULMONARY / CRITICAL CARE MEDICINE   Name: Jane Watkins MRN: [id number] DOB: May 14, 1995    ADMISSION DATE:  04/19/2016  CHIEF COMPLAINT:  Unresponsive  BRIEF SUMMARY:  21 y/o F with a history of opiate abuse who was found next to her boyfriend (who was deceased). She was unresponsive. She was brought to [facility] where she was found to combative and was intubated for airway protection, concern for aspiration. Her CK was 15k, and had a mild lactic acidosis. She was brought to [facility] for further evaluation.  SUBJECTIVE:  Follows commands, much more cooperative. No PO route available  VITAL SIGNS: BP 110/60   Pulse 98   Temp 100 F (37.8 C) (Oral)   Resp 14   Ht 5\' 4"  (1.626 m)   Wt 53 kg (116 lb 13.5 oz)   LMP  (LMP Unknown)   SpO2 97%   BMI 20.06 kg/m   HEMODYNAMICS:    VENTILATOR SETTINGS: FiO2 (%):  [28 %-40 %] 28 %  INTAKE / OUTPUT: I/O last 3 completed shifts: In: 1766.9 [I.V.:766.9; IV Piggyback:1000] Out: 2495 [Urine:2495]  PHYSICAL EXAMINATION: General: Young adult female ,off all IV sedation x 24 hours. Follows commands HEENT: jvd w nl, Trach Neuro: FC, moves all ext eqaully CV: s1s2 rrr, no m/r/g PULM: CTA , CTA, dry cough WU:JWJX, non-tender, bsx4 active , 1 bm, tf on hold as no ft. Try sip of H20 promptly coughed Extremities: warm/dry, no edema  Skin: no rashes or lesions  LABS:  BMET  Recent Labs Lab 04/28/16 0519 04/29/16 0500 04/30/16 0312  NA 137 136 136  K 3.9 3.5 3.8  CL 103 100* 104  CO2 25 24 21*  BUN 9 12 13   CREATININE 0.40* 0.49 0.34*  GLUCOSE 92 85 112*   Electrolytes  Recent Labs Lab 04/27/16 0501 04/28/16 0519 04/29/16 0500 04/30/16 0312  CALCIUM 8.2* 8.6* 9.2 9.1  MG 1.7 2.0  --  2.0  PHOS 3.2 4.2  --  3.2   CBC  Recent Labs Lab 04/27/16 0501 04/28/16 0519 04/30/16 0312  WBC 9.2 12.0* 21.2*  HGB 10.5* 11.0* 11.5*  HCT 31.0* 32.6* 33.9*  PLT 273 305 410*   Coag's  Recent Labs Lab 04/28/16 1034  APTT 28   INR 1.08    Sepsis Markers No results for input(s): LATICACIDVEN, PROCALCITON, O2SATVEN in the last 168 hours. ABG  Recent Labs Lab 04/26/16 0406 04/27/16 0420 04/28/16 0445  PHART 7.383 7.451* 7.389  PCO2ART 41.9 33.5 41.8  PO2ART 80.8* 94.8 104   Liver Enzymes  Recent Labs Lab 04/25/16 0505  AST 25  ALT 36  ALKPHOS 33*  BILITOT 0.4  ALBUMIN 2.6*   Cardiac Enzymes No results for input(s): TROPONINI, PROBNP in the last 168 hours. Glucose  Recent Labs Lab 04/29/16 0822 04/29/16 1141 04/29/16 1609 04/29/16 2013 04/29/16 2343 04/30/16 0441  GLUCAP 75 91 94 109* 112* 107*   Imaging No results found.  STUDIES:  Head CT 3/10 >> Normal ECHO 3/11 >> LVEF 65-70%, no RWMA, no evidence of vegetation Continuous EEG 3/13 >> MRI 3/13 >> unremarkable appearance of the brain  CULTURES: Sputum 3/11 >> MRSA  BCx2 3/11 >>neg  UC 3/10 >> greater 100k coag neg staph >> sens vanco  ANTIBIOTICS: Unasyn 3/11 >> 3/14 Vancomycin 3/11 >> 3/20  SIGNIFICANT EVENTS: 3/10  Admit after overdose 3/13  Neurology consulted, continuous EEG initiated 3/15 wild   3/16 not as agitated 3/20 will try t collar 3/21 24 hours off vent.  No feeding tube unable to place due to edema  LINES/TUBES: ETT 3/10 >> 3/14 ett 3/14(FAILED EXTUBATION)>>3/19 Trach (df) 3/19>>  DISCUSSION: 21 y/o p/w AMS and intoxication following apparent opiate O/D.  Person was deceased at her side. 2022-05-17 follows commands.   ASSESSMENT / PLAN:  PULMONARY A: Need for mechanical ventilation(extubated 3/14 and developed stridor and reintubated) Acute Respiratory failure, stridor post extubation RLL Aspiration PNA - mild airspace disease Tobacco Abuse  3/16 increased FIO2 needs Trached 3/19 and required increased fio2 and peep 8 with possible mucus plug. Resolved 05/17/22 ? If clonidine might help with wd sxs but BP low P:  T collar x 24 hours Leave off vent Decrease sedation.  Increase Seroquel 17-May-2022 to 100  mg bid(not getting 3/21) Mucomyst/percussion x 24 hours, complete 3/21  CARDIOVASCULAR A:  Troponinemia - likely demand ischemia P:  Resoved  RENAL  Recent Labs Lab 04/28/16 0519 May 16, 2016 0500 04/30/16 0312  K 3.9 3.5 3.8    Recent Labs Lab 04/28/16 0519 05/16/2016 0500 04/30/16 0312  NA 137 136 136   Filed Weights   04/28/16 0600 May 16, 2016 0500 04/30/16 0500  Weight: 59 kg (130 lb 1.1 oz) 55 kg (121 lb 4.1 oz) 53 kg (116 lb 13.5 oz)    Intake/Output Summary (Last 24 hours) at 04/30/16 0747 Last data filed at 04/30/16 0700  Gross per 24 hour  Intake          1063.03 ml  Output             2245 ml  Net         -1181.97 ml     A:   Rhabdomyolysis(improving) resolved 3/16 ck 168 Lactic acidosis->resolved Hypokalemia  P:   Chem in am  IVF to 50. Hold lasix 05-17-2022. 3/21 autodiuresing   GASTROINTESTINAL A:   vomting ( from upper airway irritation?) P:   TF on hold, no feeding tube able to be placed NPO / no ogt reorder swallow eval PPI for SUP  HEMATOLOGIC A:   No active issues P:  Lovenox for DVT prophylaxis   INFECTIOUS A:   Suspected Aspiration - minimal airspace disease on CXR MRSA in Sputum  Coag Neg Staph UTI P:   vanc finished 17-May-2022   ENDOCRINE A:   No active issues   P:   Trend glucose on BMP  NEUROLOGIC A:   Opiate Overdose  Acute Encephalopathy  Rule Out Anoxic Injury, not evident on head MRI 3/13 P:   RASS Goal: 0  Off sedation drips 3/21. Calmer and more interactive. No prn IV drugs overnight Thiamine/folic acid Increase Seroquel to 100mg  BID Increased Klonopin to 2 mg PO BID, will continue(note 3/21 no oral route available as no feeding tube) May 17, 2022 dc continuous IV fentanyl and go to intermittent  She will need psych consult in future  [facility] ACNP Adolph Pollack PCCM Pager [phone] till 3 pm If no answer page [phone] 04/30/2016, 7:47 AM   STAFF NOTE: I, Rory Percy, MD FACP have personally reviewed patient's  available data, including medical history, events of note, physical examination and test results as part of my evaluation. I have discussed with resident/NP and other care providers such as pharmacist, RN and RRT. In addition, I personally evaluated patient and elicited key findings of: awake, alert, calm in chair, lungs less ronchi, abdo soft, trach clean, was neg 1.2 liters, some hemoconcentration noted, tolerated lasix, could reduce, given upper airway concerns remains, would have concervatve slow approach to PMV, get  SLP, hope she can just swallow from here, cuff down okay if secretions lower, allow mucomysts to dc after 2 more doses, she is mobilizing secretions well now, to sdu, triad as primary, will follow for trach care plan, I updated dad at bedside   Mcarthur Rossettianiel J. Tyson AliasFeinstein, MD, FACP Pgr: [phone] [facility] 04/30/2016 10:07 AM

## 2016-04-30 NOTE — Progress Notes (Signed)
Modified Barium Swallow Progress Note  Patient Details  Name: Jane Watkins MRN: [id number] Date of Birth: 08/31/1995  Today's Date: 04/30/2016  Modified Barium Swallow completed.  Full report located under Chart Review in the Imaging Section.  Brief recommendations include the following:  Clinical Impression  Pt has a primarily pharyngeal dysphagia due to incomplete airway closure, suspect related to prolonged intubations and reduced subglottic pressure as PMV can not be worn. Nectar thick liquids are able to enter into the airway during the swallow with silent aspiration observed. A chin tuck still resulted in silent, frank penetration, and pt is not able to expel penetrates with a cued cough. Trace penetration is observed with spoonfuls of honey thick liquids, but it is spontaneously ejected with additional swallows. Her oral phase is mildly discoordinated with solid POs, resulting in mild premature spillage. Given the above and the likelihood that this is acute and reversible with good prognosis for advancement even as PMV can be tolerated, recommend a more conservative diet to start: Dys 1 textures, honey thick liquids by spoon. Will follow for tolerance and advancement as warranted.   Swallow Evaluation Recommendations       SLP Diet Recommendations: Dysphagia 1 (Puree) solids;Honey thick liquids   Liquid Administration via: Spoon   Medication Administration: Crushed with puree   Supervision: Patient able to self feed;Full supervision/cueing for compensatory strategies   Compensations: Slow rate;Small sips/bites   Postural Changes: Seated upright at 90 degrees   Oral Care Recommendations: Oral care BID   Other Recommendations: Order thickener from pharmacy;Prohibited food (jello, ice cream, thin soups);Remove water pitcher    Jane Watkins, Jane Watkins 04/30/2016,2:46 PM   Jane Watkins, Jane Watkins [phone]

## 2016-04-30 NOTE — Progress Notes (Signed)
[employer] camera placed in pts room. Report given to Mia, in monitoring. She confirms being able to visualize this patient.

## 2016-04-30 NOTE — Progress Notes (Signed)
Pt refused CPT at this time, sleeping.

## 2016-04-30 NOTE — Progress Notes (Signed)
Nutrition Follow-up  INTERVENTION:   Magic cup TID with meals, each supplement provides 290 kcal and 9 grams of protein  NUTRITION DIAGNOSIS:   Inadequate oral intake related to dysphagia as evidenced by  (altered texture diet). Ongoing.   GOAL:   Patient will meet greater than or equal to 90% of their needs Progressing.   MONITOR:   PO intake, Supplement acceptance, Diet advancement, Skin  ASSESSMENT:   21 y/o p/w AMS and intoxication following apparent opiate O/D.  Boyfriend was deceased at her side.  3/19 trach placed 3/20 failed Cortrak placement due to excessive swelling 3/21 MBS just completed, started dysphagia 1 diet with Honey thickened liquids   Diet Order:  DIET - DYS 1 Room service appropriate? Yes; Fluid consistency: Honey Thick  Skin:   (skin tear right face)  Last BM:  3/19  Height:   Ht Readings from Last 1 Encounters:  04/19/16 5\' 4"  (1.626 m)    Weight:   Wt Readings from Last 1 Encounters:  04/30/16 116 lb 13.5 oz (53 kg)    Ideal Body Weight:  54.5 kg  BMI:  Body mass index is 20.06 kg/m.  Estimated Nutritional Needs:   Kcal:  1700-1900  Protein:  70-85 grams   Fluid:  > 1.6 L/day  EDUCATION NEEDS:   No education needs identified at this time  Kendell BaneHeather Azriel Dancy RD, LDN, CNSC [phone] Pager [phone] After Hours Pager

## 2016-05-01 ENCOUNTER — Inpatient Hospital Stay (HOSPITAL_COMMUNITY)
Admission: RE | Admit: 2016-05-01 | Discharge: 2016-05-09 | DRG: 093 | Disposition: A | Discharge status: Home / Self Care | Payer: 59 | Source: Intra-hospital | Attending: Physical Medicine & Rehabilitation | Admitting: Physical Medicine & Rehabilitation

## 2016-05-01 DIAGNOSIS — Z930 Tracheostomy status: Secondary | ICD-10-CM

## 2016-05-01 DIAGNOSIS — R000 Tachycardia, unspecified: Secondary | ICD-10-CM | POA: Diagnosis not present

## 2016-05-01 DIAGNOSIS — R1310 Dysphagia, unspecified: Secondary | ICD-10-CM | POA: Diagnosis not present

## 2016-05-01 DIAGNOSIS — F419 Anxiety disorder, unspecified: Secondary | ICD-10-CM | POA: Diagnosis not present

## 2016-05-01 DIAGNOSIS — Z720 Tobacco use: Secondary | ICD-10-CM | POA: Diagnosis not present

## 2016-05-01 DIAGNOSIS — G931 Anoxic brain damage, not elsewhere classified: Secondary | ICD-10-CM | POA: Diagnosis present

## 2016-05-01 DIAGNOSIS — D72829 Elevated white blood cell count, unspecified: Secondary | ICD-10-CM

## 2016-05-01 DIAGNOSIS — F17210 Nicotine dependence, cigarettes, uncomplicated: Secondary | ICD-10-CM | POA: Diagnosis not present

## 2016-05-01 DIAGNOSIS — T40601A Poisoning by unspecified narcotics, accidental (unintentional), initial encounter: Secondary | ICD-10-CM | POA: Diagnosis not present

## 2016-05-01 DIAGNOSIS — M7989 Other specified soft tissue disorders: Secondary | ICD-10-CM | POA: Diagnosis not present

## 2016-05-01 DIAGNOSIS — R4189 Other symptoms and signs involving cognitive functions and awareness: Secondary | ICD-10-CM | POA: Diagnosis not present

## 2016-05-01 DIAGNOSIS — Z22322 Carrier or suspected carrier of Methicillin resistant Staphylococcus aureus: Secondary | ICD-10-CM

## 2016-05-01 DIAGNOSIS — T40601S Poisoning by unspecified narcotics, accidental (unintentional), sequela: Secondary | ICD-10-CM | POA: Diagnosis not present

## 2016-05-01 DIAGNOSIS — F1010 Alcohol abuse, uncomplicated: Secondary | ICD-10-CM

## 2016-05-01 DIAGNOSIS — T40604S Poisoning by unspecified narcotics, undetermined, sequela: Secondary | ICD-10-CM

## 2016-05-01 DIAGNOSIS — S069X9A Unspecified intracranial injury with loss of consciousness of unspecified duration, initial encounter: Secondary | ICD-10-CM | POA: Insufficient documentation

## 2016-05-01 DIAGNOSIS — S069XAA Unspecified intracranial injury with loss of consciousness status unknown, initial encounter: Secondary | ICD-10-CM | POA: Insufficient documentation

## 2016-05-01 DIAGNOSIS — T40601D Poisoning by unspecified narcotics, accidental (unintentional), subsequent encounter: Secondary | ICD-10-CM | POA: Diagnosis not present

## 2016-05-01 DIAGNOSIS — R1312 Dysphagia, oropharyngeal phase: Secondary | ICD-10-CM | POA: Diagnosis not present

## 2016-05-01 LAB — CBC
HEMATOCRIT: 39.7 % (ref 36.0–46.0)
HEMOGLOBIN: 13.3 g/dL (ref 12.0–15.0)
MCH: 29.9 pg (ref 26.0–34.0)
MCHC: 33.5 g/dL (ref 30.0–36.0)
MCV: 89.2 fL (ref 78.0–100.0)
Platelets: 496 10*3/uL — ABNORMAL HIGH (ref 150–400)
RBC: 4.45 MIL/uL (ref 3.87–5.11)
RDW: 13.4 % (ref 11.5–15.5)
WBC: 14.8 10*3/uL — ABNORMAL HIGH (ref 4.0–10.5)

## 2016-05-01 LAB — BASIC METABOLIC PANEL
Anion gap: 11 (ref 5–15)
BUN: 11 mg/dL (ref 6–20)
CALCIUM: 9.6 mg/dL (ref 8.9–10.3)
CO2: 23 mmol/L (ref 22–32)
CREATININE: 0.39 mg/dL — AB (ref 0.44–1.00)
Chloride: 107 mmol/L (ref 101–111)
Glucose, Bld: 102 mg/dL — ABNORMAL HIGH (ref 65–99)
Potassium: 3.8 mmol/L (ref 3.5–5.1)
Sodium: 141 mmol/L (ref 135–145)

## 2016-05-01 LAB — GLUCOSE, CAPILLARY
Glucose-Capillary: 79 mg/dL (ref 65–99)
Glucose-Capillary: 92 mg/dL (ref 65–99)

## 2016-05-01 MED ORDER — ENOXAPARIN SODIUM 40 MG/0.4ML ~~LOC~~ SOLN
40.0000 mg | SUBCUTANEOUS | Status: DC
  Administered 2016-05-02 – 2016-05-08 (×7): 40 mg via SUBCUTANEOUS
  Filled 2016-05-01 (×8): qty 0.4

## 2016-05-01 MED ORDER — CLONAZEPAM 0.5 MG PO TBDP
2.0000 mg | ORAL_TABLET | Freq: Two times a day (BID) | ORAL | Status: DC
  Administered 2016-05-02 – 2016-05-08 (×12): 2 mg via ORAL
  Filled 2016-05-01 (×12): qty 4

## 2016-05-01 MED ORDER — BISACODYL 10 MG RE SUPP: 10.0000 mg | Freq: Every day | RECTAL | Status: DC | PRN

## 2016-05-01 MED ORDER — SODIUM CHLORIDE 0.45 % IV SOLN
INTRAVENOUS | Status: DC
  Administered 2016-05-01 – 2016-05-06 (×6): via INTRAVENOUS

## 2016-05-01 MED ORDER — SORBITOL 70 % SOLN
30.0000 mL | Freq: Every day | Status: DC | PRN
  Administered 2016-05-08: 30 mL via ORAL
  Filled 2016-05-01: qty 30

## 2016-05-01 MED ORDER — RESOURCE THICKENUP CLEAR PO POWD
ORAL | Status: DC | PRN
  Filled 2016-05-01: qty 125

## 2016-05-01 MED ORDER — QUETIAPINE FUMARATE 100 MG PO TABS
100.0000 mg | ORAL_TABLET | Freq: Two times a day (BID) | ORAL | Status: DC
  Administered 2016-05-01 – 2016-05-06 (×10): 100 mg via ORAL
  Filled 2016-05-01 (×10): qty 1

## 2016-05-01 MED ORDER — VITAMIN B-1 100 MG PO TABS
100.0000 mg | ORAL_TABLET | Freq: Every day | ORAL | Status: DC
  Administered 2016-05-02 – 2016-05-07 (×6): 100 mg via ORAL
  Filled 2016-05-01 (×6): qty 1

## 2016-05-01 MED ORDER — ENOXAPARIN SODIUM 40 MG/0.4ML ~~LOC~~ SOLN: 40.0000 mg | SUBCUTANEOUS | Status: DC

## 2016-05-01 MED ORDER — FOLIC ACID 1 MG PO TABS
1.0000 mg | ORAL_TABLET | Freq: Every day | ORAL | Status: DC
  Administered 2016-05-02 – 2016-05-07 (×6): 1 mg via ORAL
  Filled 2016-05-01 (×6): qty 1

## 2016-05-01 MED ORDER — ADULT MULTIVITAMIN W/MINERALS CH
1.0000 | ORAL_TABLET | Freq: Every day | ORAL | Status: DC
  Administered 2016-05-02 – 2016-05-09 (×8): 1 via ORAL
  Filled 2016-05-01 (×8): qty 1

## 2016-05-01 MED ORDER — ALBUTEROL SULFATE (2.5 MG/3ML) 0.083% IN NEBU: 2.5000 mg | INHALATION_SOLUTION | RESPIRATORY_TRACT | Status: DC | PRN

## 2016-05-01 MED ORDER — FLUOXETINE HCL 20 MG/5ML PO SOLN
30.0000 mg | Freq: Every day | ORAL | Status: DC
  Administered 2016-05-02 – 2016-05-08 (×7): 30 mg via ORAL
  Filled 2016-05-01 (×7): qty 10

## 2016-05-01 MED ORDER — ONDANSETRON HCL 4 MG PO TABS: 4.0000 mg | ORAL_TABLET | Freq: Four times a day (QID) | ORAL | Status: DC | PRN

## 2016-05-01 MED ORDER — FLUOXETINE HCL 20 MG/5ML PO SOLN
30.0000 mg | Freq: Every day | ORAL | Status: DC
  Administered 2016-05-01: 30 mg via ORAL
  Filled 2016-05-01: qty 10

## 2016-05-01 MED ORDER — ONDANSETRON HCL 4 MG/2ML IJ SOLN
4.0000 mg | Freq: Four times a day (QID) | INTRAMUSCULAR | Status: DC | PRN
  Administered 2016-05-03: 4 mg via INTRAVENOUS
  Filled 2016-05-01 (×2): qty 2

## 2016-05-01 NOTE — Progress Notes (Signed)
Rehab admissions - I met with patient's dad and step mom yesterday morning.  I have opened the case with [facility] insurance carrier.  I await a call back from insurance case manager regarding acute inpatient rehab admission.  Dad does want inpatient rehab admission.  I should hear from insurance carrier today.  I will follow up this morning.  Call me for questions.  #[phone]

## 2016-05-01 NOTE — Progress Notes (Signed)
PROGRESS NOTE    Jane CarinaLydia Skoglund  WNU:[id number] DOB: 04/15/1995 DOA: 04/19/2016 PCP: No PCP Per Patient     Brief Narrative:  In brief, this is a 21 year old female with history of opiate abuse was found next to her boyfriend (who was found deceased). She was unresponsive at the time. [facility] was [facility] where she was found to be combative and was intubated for airway protection. [facility] MRI did not show any abnormalities, EEG showed no etiology or signs of seizure. Neurology was consulted during admission.  SIGNIFICANT EVENTS: 3/10  Admit after overdose, intubated  3/13  Neurology consulted, continuous EEG initiated 3/14  Failed extubation 3/19  Trach 3/21  24 hours off vent. No feeding tube unable to place due to edema  Assessment & Plan:   Active Problems:   Encephalopathy   Opiate overdose   Acute respiratory failure with hypoxia (HCC)   Drug overdose   Elevated liver enzymes   Encephalopathy acute   Elevated troponin   Opioid abuse   Non-traumatic rhabdomyolysis   Pneumonia of both lower lobes due to methicillin resistant Staphylococcus aureus (MRSA) (HCC)   Acute pulmonary edema (HCC)   Glasgow coma scale total score 3-8 (HCC)   Respiratory distress  Acute hypoxemic respiratory failure -Secondary to opioid overdose  -Now on trach collar  Acute Encephalopathy  -Due to above, possibly anoxic brain injury -Continue therapy, may need psych eval down the line   Suspected aspiration, MRSA in sputum, coag-negative staph UTI -Finished vancomycin -Blood culture negative   Leukocytosis -Improving   Elevated troponin -Demand ischemia, peak at 2.94 and trended down   Rhabdomyolysis -Resolved  Lactic acidosis -Resolved   Subjective: Patient remains calm, in no acute events overnight. She does not readily participate in exam or answer my questions. She does shake her head no when asked if she has had breakfast this  morning.   Patient to transfer to inpatient rehab today.   Objective: Vitals:   05/01/16 0700 05/01/16 0800 05/01/16 0900 05/01/16 1000  BP: 114/71 106/74 (!) 101/49 117/85  Pulse: 88 86 94   Resp: (!) 23 (!) 22 (!) 21 (!) 28  Temp:  100.2 F (37.9 C)    TempSrc:  Oral    SpO2: 100% 99% 100%   Weight:      Height:        Intake/Output Summary (Last 24 hours) at 05/01/16 1152 Last data filed at 05/01/16 1000  Gross per 24 hour  Intake             1160 ml  Output                0 ml  Net             1160 ml   Filed Weights   04/29/16 0500 04/30/16 0500 05/01/16 0500  Weight: 55 kg (121 lb 4.1 oz) 53 kg (116 lb 13.5 oz) 51.6 kg (113 lb 12.1 oz)    Examination:  General exam: Appears calm and comfortable  Respiratory system: Clear to auscultation. Respiratory effort normal. Cardiovascular system: S1 & S2 heard, RRR. No JVD, murmurs, rubs, gallops or clicks. No pedal edema. Gastrointestinal system: Abdomen is nondistended, soft and nontender. No organomegaly or masses felt. Normal bowel sounds heard. Central nervous system: Alert  Extremities: Symmetric 5 x 5 power. Skin: No rashes, lesions or ulcers  Data Reviewed: I have personally reviewed following labs and imaging studies  CBC:  Recent  Labs Lab 04/26/16 0500 04/27/16 0501 04/28/16 0519 04/30/16 0312 05/01/16 0842  WBC 7.0 9.2 12.0* 21.2* 14.8*  HGB 10.5* 10.5* 11.0* 11.5* 13.3  HCT 31.5* 31.0* 32.6* 33.9* 39.7  MCV 90.3 89.6 88.6 86.7 89.2  PLT 290 273 305 410* 496*   Basic Metabolic Panel:  Recent Labs Lab 04/25/16 0505 04/26/16 0500 04/27/16 0501 04/28/16 0519 04/29/16 0500 04/30/16 0312 05/01/16 0339  NA 141 141 139 137 136 136 141  K 3.9 3.7 3.3* 3.9 3.5 3.8 3.8  CL 112* 112* 107 103 100* 104 107  CO2 25 25 25 25 24  21* 23  GLUCOSE 145* 127* 98 92 85 112* 102*  BUN 8 11 14 9 12 13 11   CREATININE 0.36* 0.33* 0.30* 0.40* 0.49 0.34* 0.39*  CALCIUM 8.1* 8.5* 8.2* 8.6* 9.2 9.1 9.6  MG 2.2 2.0  1.7 2.0  --  2.0  --   PHOS 3.5 3.7 3.2 4.2  --  3.2  --    GFR: Estimated Creatinine Clearance: 91.4 mL/min (A) (by C-G formula based on SCr of 0.39 mg/dL (L)). Liver Function Tests:  Recent Labs Lab 04/25/16 0505  AST 25  ALT 36  ALKPHOS 33*  BILITOT 0.4  PROT 5.4*  ALBUMIN 2.6*   No results for input(s): LIPASE, AMYLASE in the last 168 hours. No results for input(s): AMMONIA in the last 168 hours. Coagulation Profile:  Recent Labs Lab 04/28/16 1034  INR 1.08   Cardiac Enzymes:  Recent Labs Lab 04/25/16 0505  CKTOTAL 168  CKMB 2.4   BNP (last 3 results) No results for input(s): PROBNP in the last 8760 hours. HbA1C: No results for input(s): HGBA1C in the last 72 hours. CBG:  Recent Labs Lab 04/30/16 0809 04/30/16 1131 04/30/16 2002 04/30/16 2308 05/01/16 0809  GLUCAP 107* 118* 103* 90 79   Lipid Profile: No results for input(s): CHOL, HDL, LDLCALC, TRIG, CHOLHDL, LDLDIRECT in the last 72 hours. Thyroid Function Tests: No results for input(s): TSH, T4TOTAL, FREET4, T3FREE, THYROIDAB in the last 72 hours. Anemia Panel: No results for input(s): VITAMINB12, FOLATE, FERRITIN, TIBC, IRON, RETICCTPCT in the last 72 hours. Sepsis Labs: No results for input(s): PROCALCITON, LATICACIDVEN in the last 168 hours.  No results found for this or any previous visit (from the past 240 hour(s)).     Radiology Studies: Dg Chest Port 1 View  Result Date: 04/30/2016 CLINICAL DATA:  Respiratory failure. EXAM: PORTABLE CHEST 1 VIEW COMPARISON:  April 29, 2016 FINDINGS: Tracheostomy catheter tip is 5.3 cm above the Watkins. No pneumothorax. There is no edema or consolidation. Heart size and pulmonary vascularity are normal. No adenopathy. No bone lesions. IMPRESSION: Tracheostomy catheter as described without pneumothorax. No edema or consolidation. Stable cardiac silhouette. Electronically Signed   By: Bretta Bang III M.D.   On: 04/30/2016 08:37   Dg Swallowing  Func-speech Pathology  Result Date: 04/30/2016 Objective Swallowing Evaluation: Type of Study: MBS-Modified Barium Swallow Study Patient Details Name: Jane Watkins MRN: [id number] Date of Birth: 22-Apr-1995 Today's Date: 04/30/2016 Time: SLP Start Time (ACUTE ONLY): 1325-SLP Stop Time (ACUTE ONLY): 1342 SLP Time Calculation (min) (ACUTE ONLY): 17 min Past Medical History: No past medical history on file. Past Surgical History: Past Surgical History: Procedure Laterality Date . APPENDECTOMY   . MOUTH SURGERY   HPI: 21 y/o F with a history of opiate abuse who was found unresponsive next to her boyfriend (who was deceased). She was intubated 3/10-3/14 but failed extubation due to stridor/swelling and was reintubated  3/14 until trach 3/19.  Subjective: pt alert, calm Assessment / Plan / Recommendation CHL IP CLINICAL IMPRESSIONS 04/30/2016 Clinical Impression Pt has a primarily pharyngeal dysphagia due to incomplete airway closure, suspect related to prolonged intubations and reduced subglottic pressure as PMV can not be worn. Nectar thick liquids are able to enter into the airway during the swallow with silent aspiration observed. A chin tuck still resulted in silent, frank penetration, and pt is not able to expel penetrates with a cued cough. Trace penetration is observed with spoonfuls of honey thick liquids, but it is spontaneously ejected with additional swallows. Her oral phase is mildly discoordinated with solid POs, resulting in mild premature spillage. Given the above and the likelihood that this is acute and reversible with good prognosis for advancement even as PMV can be tolerated, recommend a more conservative diet to start: Dys 1 textures, honey thick liquids by spoon. Will follow for tolerance and advancement as warranted. SLP Visit Diagnosis Dysphagia, pharyngeal phase (R13.13) Attention and concentration deficit following -- Frontal lobe and executive function deficit following -- Impact on safety and  function Mild aspiration risk;Moderate aspiration risk   CHL IP TREATMENT RECOMMENDATION 04/30/2016 Treatment Recommendations Therapy as outlined in treatment plan below   Prognosis 04/30/2016 Prognosis for Safe Diet Advancement Good Barriers to Reach Goals -- Barriers/Prognosis Comment -- CHL IP DIET RECOMMENDATION 04/30/2016 SLP Diet Recommendations Dysphagia 1 (Puree) solids;Honey thick liquids Liquid Administration via Spoon Medication Administration Crushed with puree Compensations Slow rate;Small sips/bites Postural Changes Seated upright at 90 degrees   CHL IP OTHER RECOMMENDATIONS 04/30/2016 Recommended Consults -- Oral Care Recommendations Oral care BID Other Recommendations Order thickener from pharmacy;Prohibited food (jello, ice cream, thin soups);Remove water pitcher   CHL IP FOLLOW UP RECOMMENDATIONS 04/30/2016 Follow up Recommendations Inpatient Rehab   CHL IP FREQUENCY AND DURATION 04/30/2016 Speech Therapy Frequency (ACUTE ONLY) min 2x/week Treatment Duration 2 weeks      CHL IP ORAL PHASE 04/30/2016 Oral Phase Impaired Oral - Pudding Teaspoon -- Oral - Pudding Cup -- Oral - Honey Teaspoon Piecemeal swallowing Oral - Honey Cup Piecemeal swallowing Oral - Nectar Teaspoon Piecemeal swallowing Oral - Nectar Cup -- Oral - Nectar Straw -- Oral - Thin Teaspoon -- Oral - Thin Cup -- Oral - Thin Straw -- Oral - Puree Piecemeal swallowing Oral - Mech Soft Decreased bolus cohesion;Premature spillage Oral - Regular -- Oral - Multi-Consistency -- Oral - Pill -- Oral Phase - Comment --  CHL IP PHARYNGEAL PHASE 04/30/2016 Pharyngeal Phase Impaired Pharyngeal- Pudding Teaspoon -- Pharyngeal -- Pharyngeal- Pudding Cup -- Pharyngeal -- Pharyngeal- Honey Teaspoon Reduced airway/laryngeal closure;Penetration/Aspiration during swallow Pharyngeal Material enters airway, remains ABOVE vocal cords then ejected out Pharyngeal- Honey Cup Reduced airway/laryngeal closure;Penetration/Aspiration during swallow Pharyngeal Material  enters airway, remains ABOVE vocal cords then ejected out Pharyngeal- Nectar Teaspoon Reduced airway/laryngeal closure;Penetration/Aspiration during swallow;Compensatory strategies attempted (with notebox) Pharyngeal Material enters airway, passes BELOW cords without attempt by patient to eject out (silent aspiration);Material enters airway, remains ABOVE vocal cords and not ejected out Pharyngeal- Nectar Cup -- Pharyngeal -- Pharyngeal- Nectar Straw -- Pharyngeal -- Pharyngeal- Thin Teaspoon -- Pharyngeal -- Pharyngeal- Thin Cup -- Pharyngeal -- Pharyngeal- Thin Straw -- Pharyngeal -- Pharyngeal- Puree Reduced airway/laryngeal closure Pharyngeal -- Pharyngeal- Mechanical Soft Reduced airway/laryngeal closure Pharyngeal -- Pharyngeal- Regular -- Pharyngeal -- Pharyngeal- Multi-consistency -- Pharyngeal -- Pharyngeal- Pill -- Pharyngeal -- Pharyngeal Comment --  CHL IP CERVICAL ESOPHAGEAL PHASE 04/30/2016 Cervical Esophageal Phase WFL Pudding Teaspoon -- Pudding Cup -- Honey  Teaspoon -- Honey Cup -- Nectar Teaspoon -- Nectar Cup -- Nectar Straw -- Thin Teaspoon -- Thin Cup -- Thin Straw -- Puree -- Mechanical Soft -- Regular -- Multi-consistency -- Pill -- Cervical Esophageal Comment -- No flowsheet data found. Maxcine Ham 04/30/2016, 2:44 PM  Maxcine Ham, M.A. CCC-SLP [phone]                Scheduled Meds: . chlorhexidine  15 mL Mouth Rinse BID  . clonazepam  2 mg Oral BID  . enoxaparin (LOVENOX) injection  40 mg Subcutaneous Q24H  . FLUoxetine  30 mg Oral Daily  . folic acid  1 mg Oral Daily  . mouth rinse  15 mL Mouth Rinse q12n4p  . multivitamin with minerals  1 tablet Oral Daily  . pantoprazole (PROTONIX) IV  40 mg Intravenous Q24H  . QUEtiapine  100 mg Oral BID  . sodium chloride flush  10-40 mL Intracatheter Q12H  . thiamine  100 mg Oral Daily   Continuous Infusions: . dextrose 5 % lactated ringers with kcl 50 mL/hr at 05/01/16 0600     LOS: 12 days    Time spent: 40  minutes   Noralee Stain, DO [facility] [email or web address] Password [facility] 05/01/2016, 11:52 AM

## 2016-05-01 NOTE — Progress Notes (Signed)
Trach check done changed pt inner cannula placed ambu bag in patients room no bedside pulse ox in room RN ordering at this time. [facility] in room

## 2016-05-01 NOTE — Clinical Social Work Note (Signed)
Clinical Social Worker continuing to follow patient and family for support and discharge planning needs.  Patient has been accepted to [facility] and plans to admit today.  CSW spoke with patient father and stepmother at bedside who are agreeable with [facility] admission.  Patient stepmother inquired about patient eligibility for Medicaid - CSW referred patient family to financial counseling to explore patient options.  Patient family agreeable.  Clinical Social Worker will sign off for now as social work intervention is no longer needed. Please consult us again if new need arises.  Jane GoldsJesse [name], [place] [phone]

## 2016-05-01 NOTE — Progress Notes (Signed)
Erick ColaceAndrew E Kirsteins, MD Physician Signed Physical Medicine and Rehabilitation  Consult Note Date of Service: 04/29/2016 3:18 PM  Related encounter: ED to Hosp-Admission (Current) [facility]     Expand All Collapse All   [] Hide copied text [] Hover for attribution information      Physical Medicine and Rehabilitation Consult Reason for Consult: Acute encephalopathy Referring Physician: Dr. Tyson AliasFeinstein   HPI: Jane Watkins is a 21 y.o. right handed female with history of opiate abuse. Per chart review patient lived alone and was independent prior to admission. Current plan is to discharge to her apartment with her father staying with her and providing assistance or she may discharge to her father and stepmom's home who can provide care. Presented 04/19/2016 after being found next to her boyfriend who was deceased. She was unresponsive. [facility] combative requiring intubation. Her CK was 15,000 with mild lactic acidosis. Cranial CT scan negative. Follow-up MRI of the brain unremarkable 04/22/2016. UDS positive for opiates. EEG consistent with severe nonspecific diffuse cerebral dysfunction with no seizure activity. Maintain on ventilatory support with failed extubation and ultimately with percutaneous tracheostomy placement 04/28/2016. Attempts to place a nasogastric tube was unsuccessful currently on IV fluids for hydration. MRSA PCR screen positive maintained on contact precautions. Subcutaneous Lovenox for DVT prophylaxis. Physical and Occupational therapy evaluations completed  with recommendations of physical medicine rehabilitation consult.  Discussed with speech therapy, tolerated ice chips, modified barium swallow this afternoon. Discussed with pulmonology, vocal cord edema noted during intubation. He does not think tracheostomy will be removed in the near term  Review of Systems  Unable to  perform ROS: Acuity of condition   History reviewed. No pertinent past medical history.      Past Surgical History:  Procedure Laterality Date  . APPENDECTOMY    . MOUTH SURGERY          Family History  Problem Relation Age of Onset  . Anemia Father    Social History:  reports that she has been smoking Cigarettes.  She has a 4.00 pack-year smoking history. She has never used smokeless tobacco. She reports that she does not drink alcohol or use drugs. Allergies:       Allergies  Allergen Reactions  . Other Rash    Tide detergent   . Sulfa Antibiotics Rash         Medications Prior to Admission  Medication Sig Dispense Refill  . ranitidine (ZANTAC) 150 MG tablet Take 150 mg by mouth daily as needed for heartburn.    Marland Kitchen. FLUoxetine (PROZAC) 20 MG tablet Take 30 mg by mouth daily.    Marland Kitchen. HYDROcodone-acetaminophen (NORCO) 7.5-325 MG tablet Take 1 tablet by mouth daily as needed for moderate pain.    . [DISCONTINUED] dicyclomine (BENTYL) 20 MG tablet Take 1 tablet (20 mg total) by mouth 2 (two) times daily. (Patient not taking: Reported on 04/20/2016) 20 tablet 0  . [DISCONTINUED] ibuprofen (ADVIL,MOTRIN) 600 MG tablet Take 1 tablet (600 mg total) by mouth every 6 (six) hours as needed for moderate pain. (Patient not taking: Reported on 04/20/2016) 20 tablet 0  . [DISCONTINUED] Lactobacillus (ACIDOPHILUS PROBIOTIC) 10 MG TABS Take 10 mg by mouth 3 (three) times daily. (Patient not taking: Reported on 04/20/2016) 30 tablet 0  . [DISCONTINUED] metroNIDAZOLE (FLAGYL) 500 MG tablet Take 1 tablet (500 mg total) by mouth 2 (two) times daily. (Patient not taking: Reported on 04/20/2016) 14 tablet  0  . [DISCONTINUED] ondansetron (ZOFRAN ODT) 4 MG disintegrating tablet Take 1 tablet (4 mg total) by mouth every 8 (eight) hours as needed for nausea or vomiting. (Patient not taking: Reported on 04/20/2016) 10 tablet 0  . [DISCONTINUED] predniSONE (DELTASONE) 20 MG tablet Take 2 tablets (40 mg  total) by mouth daily with breakfast. (Patient not taking: Reported on 04/20/2016) 10 tablet 0    Home: Home Living Family/patient expects to be discharged to:: Private residence Living Arrangements: Alone Available Help at Discharge: Family, Available 24 hours/day Type of Home: House Home Access: Stairs to enter [employer] of Steps: 5 at her dad's home; 1 step into her apartment  Home Layout: One level Bathroom Shower/Tub: Tub/shower unit, Health visitor: Standard Home Equipment: None Additional Comments: Pt will either discharge to her apt with her dad staying with her, or she may discharge to her dad and stepmom's home. She will have 24/7 assist.   Functional History: Prior Function Level of Independence: Independent Functional Status:  Mobility: Bed Mobility Overal bed mobility: Needs Assistance Bed Mobility: Supine to Sit Supine to sit: Max assist General bed mobility comments: Pt able to assist with lifting trunk from bed  Transfers Overall transfer level: Needs assistance Equipment used: 1 person hand held assist Transfers: Sit to/from Stand, Stand Pivot Transfers Sit to Stand: Mod assist Stand pivot transfers: Max assist, +2 physical assistance General transfer comment: Pt stands with max prompting and assist to lift hips and extend trunk.  Knees buckl mid transfer requiring max A +2 to safely move to chair   ADL: ADL Overall ADL's : Needs assistance/impaired Eating/Feeding: NPO Grooming: Wash/dry hands, Wash/dry face, Oral care, Brushing hair, Maximal assistance, Sitting Grooming Details (indicate cue type and reason): requires assist to maintain attention to task  Upper Body Bathing: Total assistance, Sitting Lower Body Bathing: Total assistance, Sit to/from stand Upper Body Dressing : Total assistance, Sitting Lower Body Dressing: Total assistance, Sit to/from stand Toilet Transfer: Maximal assistance, +2 for physical assistance,  Stand-pivot, BSC Toilet Transfer Details (indicate cue type and reason): knees buckle mid transfer  Toileting- Clothing Manipulation and Hygiene: Total assistance, Sit to/from stand Functional mobility during ADLs: Maximal assistance, +2 for physical assistance General ADL Comments: step mom and dad present.  Pt unable to sustain attention to fully engage in ADL tasks   Cognition: Cognition Overall Cognitive Status: Impaired/Different from baseline Orientation Level: Intubated/Tracheostomy - Unable to assess Cognition Arousal/Alertness: Awake/alert Behavior During Therapy: Restless, Flat affect Overall Cognitive Status: Impaired/Different from baseline Area of Impairment: Attention, Following commands, Safety/judgement, Problem solving Orientation Level: Disoriented to, Situation Current Attention Level: Focused Following Commands: Follows one step commands inconsistently, Follows one step commands with increased time Safety/Judgement: Decreased awareness of safety, Decreased awareness of deficits Problem Solving: Slow processing, Decreased initiation, Difficulty sequencing, Requires verbal cues, Requires tactile cues General Comments: Pt requires mod multimodal commands to follow simple commands.  pt is easily distracted.  She knows she is in hospital, March, 2018  Blood pressure 123/70, pulse (!) 129, temperature 97.9 F (36.6 C), temperature source Axillary, resp. rate (!) 23, height 5\' 4"  (1.626 m), weight 55 kg (121 lb 4.1 oz), SpO2 94 %. Physical Exam  HENT:  Head: Normocephalic.  Eyes:  Pupils round and reactive to light  Neck:  Tracheostomy tube in place  Cardiovascular: Normal rate and regular rhythm.   Respiratory:  Limited inspiratory effort but clear to auscultation  GI: Soft. Bowel sounds are normal. She exhibits no distension.  Neurological:  Lethargic but arousable. She would make eye contact with examiner. Inconsistent to follow one-step commands. She was  nonverbal  Per speech therapy, who is just finished working with the patient. She was oriented to person, place. Patient started becoming fatigued during the end of the session. She mouthes words. Strong cough through trach Motor exam limited by participation. She has 5/5. Upper limb strength at the deltoid, bicep, triceps grip. Lower limb does not give full effort is briefly able to lift both legs antigravity. Withdraws to pinch in both lower limbs. Tone without evidence of hyperreflexia in the lower limbs. No evidence of ataxia with finger nose finger testing Lab Results Last 24 Hours       Results for orders placed or performed during the hospital encounter of 04/19/16 (from the past 24 hour(s))  Glucose, capillary     Status: None   Collection Time: 04/28/16  3:54 PM  Result Value Ref Range   Glucose-Capillary 83 65 - 99 mg/dL  Glucose, capillary     Status: None   Collection Time: 04/28/16  8:31 PM  Result Value Ref Range   Glucose-Capillary 74 65 - 99 mg/dL   Comment 1 Notify RN    Comment 2 Document in Chart   Glucose, capillary     Status: None   Collection Time: 04/29/16 12:52 AM  Result Value Ref Range   Glucose-Capillary 84 65 - 99 mg/dL  Glucose, capillary     Status: None   Collection Time: 04/29/16  4:19 AM  Result Value Ref Range   Glucose-Capillary 86 65 - 99 mg/dL   Comment 1 Notify RN    Comment 2 Document in Chart   Basic metabolic panel     Status: Abnormal   Collection Time: 04/29/16  5:00 AM  Result Value Ref Range   Sodium 136 135 - 145 mmol/L   Potassium 3.5 3.5 - 5.1 mmol/L   Chloride 100 (L) 101 - 111 mmol/L   CO2 24 22 - 32 mmol/L   Glucose, Bld 85 65 - 99 mg/dL   BUN 12 6 - 20 mg/dL   Creatinine, Ser 1.61 0.44 - 1.00 mg/dL   Calcium 9.2 8.9 - 09.6 mg/dL   GFR calc non Af Amer >60 >60 mL/min   GFR calc Af Amer >60 >60 mL/min   Anion gap 12 5 - 15  Glucose, capillary     Status: None   Collection Time: 04/29/16   8:22 AM  Result Value Ref Range   Glucose-Capillary 75 65 - 99 mg/dL  Glucose, capillary     Status: None   Collection Time: 04/29/16 11:41 AM  Result Value Ref Range   Glucose-Capillary 91 65 - 99 mg/dL   Comment 1 Notify RN    Comment 2 Document in Chart       Imaging Results (Last 48 hours)  Dg Chest Port 1 View  Result Date: 04/29/2016 CLINICAL DATA:  Acute respiratory failure. EXAM: PORTABLE CHEST 1 VIEW COMPARISON:  04/28/2016. FINDINGS: Tracheostomy tube and right PICC line stable position. Heart size normal. Interim near complete clearing of bibasilar atelectasis and infiltrates. Mild residual in the right base. No pleural effusion or pneumothorax . IMPRESSION: 1. Tracheostomy tube and right PICC line stable position. 2. Interim near complete clearing of bibasilar atelectasis and infiltrates. Mild residual in the right base . Electronically Signed   By: Maisie Fus  Register   On: 04/29/2016 07:57   Dg Chest Port 1 View  Result Date: 04/28/2016  CLINICAL DATA:  Acute respiratory failure. EXAM: PORTABLE CHEST 1 VIEW COMPARISON:  04/28/2016. FINDINGS: Interval extubation with placement of a midline tracheostomy tube. Right PICC tip projects over the low SVC. Heart size normal. New bibasilar airspace opacification, right greater than left. No pleural fluid. IMPRESSION: New bibasilar airspace opacification, right greater than left, may be due to atelectasis or aspiration. Electronically Signed   By: Leanna Battles M.D.   On: 04/28/2016 14:30   Dg Chest Port 1 View  Result Date: 04/28/2016 CLINICAL DATA:  Hypoxia EXAM: PORTABLE CHEST 1 VIEW COMPARISON:  April 27, 2016 FINDINGS: Endotracheal tube tip is 1.6 cm above the carina. Nasogastric tube tip and side port below the diaphragm. Central catheter tip is in the superior vena cava. No pneumothorax. No edema or consolidation. Heart size and pulmonary vascularity are normal. No adenopathy. No bone lesions. IMPRESSION: Tube and  catheter positions as described without pneumothorax. No edema or consolidation. Interval clearing of opacity from the left perihilar region. Electronically Signed   By: Bretta Bang III M.D.   On: 04/28/2016 07:11   Dg Abd Portable 1v  Result Date: 04/27/2016 CLINICAL DATA:  21 year old female with enteric tube placement. EXAM: PORTABLE ABDOMEN - 1 VIEW COMPARISON:  Lumbar spine radiograph dated 12/16/2015 and chest radiograph dated 04/27/2016 FINDINGS: Partially visualized enteric tube with tip in the left upper abdomen likely in the proximal stomach. The side port of the enteric tube is in the region of the gastroesophageal junction. Recommend advancing of the tube further into the stomach. There is no bowel dilatation or evidence of obstruction. Air is noted within the colon. No free air or radiopaque calculi identified on the provided image. The osseous structures and soft tissues are grossly unremarkable. IMPRESSION: Enteric tube with tip in the proximal stomach and side port likely in the region of the gastroesophageal junction. Recommend advancing the tube further into the stomach. Electronically Signed   By: Elgie Collard M.D.   On: 04/27/2016 22:20     Assessment/Plan: Diagnosis: Hypoxic encephalopathy with cognitive deficits, balance disorder 1. Does the need for close, 24 hr/day medical supervision in concert with the patient's rehab needs make it unreasonable for this patient to be served in a less intensive setting? Yes 2. Co-Morbidities requiring supervision/potential complications: Opiate abuse, laryngeal edema 3. Due to bladder management, bowel management, safety, skin/wound care, disease management, medication administration, pain management and patient education, does the patient require 24 hr/day rehab nursing? Yes 4. Does the patient require coordinated care of a physician, rehab nurse, PT (1-2 hrs/day, 5 days/week), OT (1-2 hrs/day, 5 days/week) and SLP (.5 hrs/day, 1  days/week) to address physical and functional deficits in the context of the above medical diagnosis(es)? Yes Addressing deficits in the following areas: balance, endurance, locomotion, strength, transferring, bowel/bladder control, bathing, dressing, feeding, grooming, toileting, cognition, speech, language, swallowing and psychosocial support 5. Can the patient actively participate in an intensive therapy program of at least 3 hrs of therapy per day at least 5 days per week? Yes 6. The potential for patient to make measurable gains while on inpatient rehab is excellent 7. Anticipated functional outcomes upon discharge from inpatient rehab are modified independent  with PT, supervision with OT, supervision with SLP. 8. Estimated rehab length of stay to reach the above functional goals is: 12-16d 9. Does the patient have adequate social supports and living environment to accommodate these discharge functional goals? Yes 10. Anticipated D/C setting: Home 11. Anticipated post D/C treatments: HH therapy 12. Overall  Rehab/Functional Prognosis: good  RECOMMENDATIONS: This patient's condition is appropriate for continued rehabilitative care in the following setting: CIR Patient has agreed to participate in recommended program. N/A Note that insurance prior authorization may be required for reimbursement for recommended care.  Comment: Scheduled for modified barium swallow today   ANGIULLI,DANIEL J., PA-C 04/29/2016    Revision History                        Routing History
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
## 2016-05-01 NOTE — H&P (Signed)
Physical Medicine and Rehabilitation Admission H&P    No chief complaint on file. : HPI: : Jane Watkins is a 21 y.o. right handed female with history of opiate abuse as well as depression and tobacco abuse. Per chart review patient lived alone and was independent prior to admission. Current plan is to discharge to her apartment with her father staying with her and providing assistance or she may discharge to her father and stepmom's home who can provide care. Presented 04/19/2016 after being found next to her boyfriend who was deceased. She was unresponsive. She was brought initially to [facility] combative requiring intubation. Her CK was 15,000 with mild lactic acidosis. Mildly elevated troponin felt to be related to demand ischemia. Cranial CT scan negative. Follow-up MRI of the brain unremarkable 04/22/2016. UDS positive for opiates. EEG consistent with severe nonspecific diffuse cerebral dysfunction with no seizure activity. Echocardiogram with ejection fraction of 70% no wall motion abnormalities. Maintain on ventilatory support with failed extubation and ultimately with percutaneous tracheostomy placement 04/28/2016. Attempts to place a nasogastric tube was unsuccessful currently on IV fluids for hydration and plan modified barium swallow completed 04/30/2016 and placed on a dysphagia #1 honey thick liquid diet. Marland Kitchen MRSA PCR screen positive maintained on contact precautions.UC 04/19/2016 greater than 100,000 coag negative staph completed course of vancomycin 04/29/2016.Patient with mild tachycardia when up with therapies no acute distress normal sinus rhythm and continued to be monitored. Subcutaneous Lovenox for DVT prophylaxis. Physical and Occupational therapy evaluations completed  with recommendations of physical medicine rehabilitation consult.Patient was admitted for a comprehensive rehabilitation program  ROS Unable to perform ROS: Acuity of condition    Past Surgical  History:  Procedure Laterality Date  . APPENDECTOMY    . MOUTH SURGERY     Family History  Problem Relation Age of Onset  . Anemia Father    Social History:  reports that she has been smoking Cigarettes.  She has a 4.00 pack-year smoking history. She has never used smokeless tobacco. She reports that she does not drink alcohol or use drugs. Allergies:  Allergies  Allergen Reactions  . Other Rash    Tide detergent   . Sulfa Antibiotics Rash   Medications Prior to Admission  Medication Sig Dispense Refill  . ranitidine (ZANTAC) 150 MG tablet Take 150 mg by mouth daily as needed for heartburn.      Home:     Functional History:    Functional Status:  Mobility:          ADL:    Cognition:      Physical Exam: There were no vitals taken for this visit. Physical Exam HENT.  Head. Normocephalic Eyes. Pupils round and reactive to light Neck. Tracheostomy tube in place Cardiovascular. Normal rate and rhythm. She is a bit tachycardic at 118 Respiratory. Limited inspiratory effort but clear to auscultation GI. Soft. Bowel sounds normal. She exhibits no distention Skin. Warm and dry Neurological. Lethargic but arousable. She did make eye contact with examiner. Inconsistent a one-step commands. She did attempt to mouth words. Motor exam limited by participation. She has 5/5. Upper limb strength at the deltoid, bicep, triceps grip. Lower limb does not give full effort is briefly able to lift both legs antigravity. Withdraws to pinch in both lower limbs. Tone without evidence of hyperreflexia in the lower limbs. No evidence of ataxia with finger nose finger testing   Results for orders placed or performed during the hospital encounter of 04/19/16 (from the past  48 hour(s))  Glucose, capillary     Status: Abnormal   Collection Time: 04/29/16  8:13 PM  Result Value Ref Range   Glucose-Capillary 109 (H) 65 - 99 mg/dL  Glucose, capillary     Status: Abnormal    Collection Time: 04/29/16 11:43 PM  Result Value Ref Range   Glucose-Capillary 112 (H) 65 - 99 mg/dL  CBC     Status: Abnormal   Collection Time: 04/30/16  3:12 AM  Result Value Ref Range   WBC 21.2 (H) 4.0 - 10.5 K/uL   RBC 3.91 3.87 - 5.11 MIL/uL   Hemoglobin 11.5 (L) 12.0 - 15.0 g/dL   HCT 33.9 (L) 36.0 - 46.0 %   MCV 86.7 78.0 - 100.0 fL   MCH 29.4 26.0 - 34.0 pg   MCHC 33.9 30.0 - 36.0 g/dL   RDW 12.6 11.5 - 15.5 %   Platelets 410 (H) 150 - 400 K/uL  Basic metabolic panel     Status: Abnormal   Collection Time: 04/30/16  3:12 AM  Result Value Ref Range   Sodium 136 135 - 145 mmol/L   Potassium 3.8 3.5 - 5.1 mmol/L   Chloride 104 101 - 111 mmol/L   CO2 21 (L) 22 - 32 mmol/L   Glucose, Bld 112 (H) 65 - 99 mg/dL   BUN 13 6 - 20 mg/dL   Creatinine, Ser 0.34 (L) 0.44 - 1.00 mg/dL   Calcium 9.1 8.9 - 10.3 mg/dL   GFR calc non Af Amer >60 >60 mL/min   GFR calc Af Amer >60 >60 mL/min    Comment: (NOTE) The eGFR has been calculated using the CKD EPI equation. This calculation has not been validated in all clinical situations. eGFR's persistently <60 mL/min signify possible Chronic Kidney Disease.    Anion gap 11 5 - 15  Phosphorus     Status: None   Collection Time: 04/30/16  3:12 AM  Result Value Ref Range   Phosphorus 3.2 2.5 - 4.6 mg/dL  Magnesium     Status: None   Collection Time: 04/30/16  3:12 AM  Result Value Ref Range   Magnesium 2.0 1.7 - 2.4 mg/dL  Glucose, capillary     Status: Abnormal   Collection Time: 04/30/16  4:41 AM  Result Value Ref Range   Glucose-Capillary 107 (H) 65 - 99 mg/dL  Glucose, capillary     Status: Abnormal   Collection Time: 04/30/16  8:09 AM  Result Value Ref Range   Glucose-Capillary 107 (H) 65 - 99 mg/dL  Glucose, capillary     Status: Abnormal   Collection Time: 04/30/16 11:31 AM  Result Value Ref Range   Glucose-Capillary 118 (H) 65 - 99 mg/dL  Glucose, capillary     Status: Abnormal   Collection Time: 04/30/16  8:02 PM    Result Value Ref Range   Glucose-Capillary 103 (H) 65 - 99 mg/dL  Glucose, capillary     Status: None   Collection Time: 04/30/16 11:08 PM  Result Value Ref Range   Glucose-Capillary 90 65 - 99 mg/dL  Basic metabolic panel     Status: Abnormal   Collection Time: 05/01/16  3:39 AM  Result Value Ref Range   Sodium 141 135 - 145 mmol/L   Potassium 3.8 3.5 - 5.1 mmol/L   Chloride 107 101 - 111 mmol/L   CO2 23 22 - 32 mmol/L   Glucose, Bld 102 (H) 65 - 99 mg/dL   BUN 11 6 - 20 mg/dL  Creatinine, Ser 0.39 (L) 0.44 - 1.00 mg/dL   Calcium 9.6 8.9 - 10.3 mg/dL   GFR calc non Af Amer >60 >60 mL/min   GFR calc Af Amer >60 >60 mL/min    Comment: (NOTE) The eGFR has been calculated using the CKD EPI equation. This calculation has not been validated in all clinical situations. eGFR's persistently <60 mL/min signify possible Chronic Kidney Disease.    Anion gap 11 5 - 15  Glucose, capillary     Status: None   Collection Time: 05/01/16  8:09 AM  Result Value Ref Range   Glucose-Capillary 79 65 - 99 mg/dL   Comment 1 Notify RN    Comment 2 Document in Chart   CBC     Status: Abnormal   Collection Time: 05/01/16  8:42 AM  Result Value Ref Range   WBC 14.8 (H) 4.0 - 10.5 K/uL   RBC 4.45 3.87 - 5.11 MIL/uL   Hemoglobin 13.3 12.0 - 15.0 g/dL   HCT 39.7 36.0 - 46.0 %   MCV 89.2 78.0 - 100.0 fL   MCH 29.9 26.0 - 34.0 pg   MCHC 33.5 30.0 - 36.0 g/dL   RDW 13.4 11.5 - 15.5 %   Platelets 496 (H) 150 - 400 K/uL  Glucose, capillary     Status: None   Collection Time: 05/01/16 11:54 AM  Result Value Ref Range   Glucose-Capillary 92 65 - 99 mg/dL   Comment 1 Notify RN    Comment 2 Document in Chart    Dg Chest Port 1 View  Result Date: 04/30/2016 CLINICAL DATA:  Respiratory failure. EXAM: PORTABLE CHEST 1 VIEW COMPARISON:  April 29, 2016 FINDINGS: Tracheostomy catheter tip is 5.3 cm above the carina. No pneumothorax. There is no edema or consolidation. Heart size and pulmonary vascularity  are normal. No adenopathy. No bone lesions. IMPRESSION: Tracheostomy catheter as described without pneumothorax. No edema or consolidation. Stable cardiac silhouette. Electronically Signed   By: Lowella Grip III M.D.   On: 04/30/2016 08:37   Dg Swallowing Func-speech Pathology  Result Date: 04/30/2016 Objective Swallowing Evaluation: Type of Study: MBS-Modified Barium Swallow Study Patient Details Name: Jane Watkins MRN: [id number] Date of Birth: 1995-05-14 Today's Date: 04/30/2016 Time: SLP Start Time (ACUTE ONLY): 1325-SLP Stop Time (ACUTE ONLY): 1342 SLP Time Calculation (min) (ACUTE ONLY): 17 min Past Medical History: No past medical history on file. Past Surgical History: Past Surgical History: Procedure Laterality Date . APPENDECTOMY   . MOUTH SURGERY   HPI: 21 y/o F with a history of opiate abuse who was found unresponsive next to her boyfriend (who was deceased). She was intubated 3/10-3/14 but failed extubation due to stridor/swelling and was reintubated 3/14 until trach 3/19.  Subjective: pt alert, calm Assessment / Plan / Recommendation CHL IP CLINICAL IMPRESSIONS 04/30/2016 Clinical Impression Pt has a primarily pharyngeal dysphagia due to incomplete airway closure, suspect related to prolonged intubations and reduced subglottic pressure as PMV can not be worn. Nectar thick liquids are able to enter into the airway during the swallow with silent aspiration observed. A chin tuck still resulted in silent, frank penetration, and pt is not able to expel penetrates with a cued cough. Trace penetration is observed with spoonfuls of honey thick liquids, but it is spontaneously ejected with additional swallows. Her oral phase is mildly discoordinated with solid POs, resulting in mild premature spillage. Given the above and the likelihood that this is acute and reversible with good prognosis for advancement even as PMV  can be tolerated, recommend a more conservative diet to start: Dys 1 textures, honey  thick liquids by spoon. Will follow for tolerance and advancement as warranted. SLP Visit Diagnosis Dysphagia, pharyngeal phase (R13.13) Attention and concentration deficit following -- Frontal lobe and executive function deficit following -- Impact on safety and function Mild aspiration risk;Moderate aspiration risk   CHL IP TREATMENT RECOMMENDATION 04/30/2016 Treatment Recommendations Therapy as outlined in treatment plan below   Prognosis 04/30/2016 Prognosis for Safe Diet Advancement Good Barriers to Reach Goals -- Barriers/Prognosis Comment -- CHL IP DIET RECOMMENDATION 04/30/2016 SLP Diet Recommendations Dysphagia 1 (Puree) solids;Honey thick liquids Liquid Administration via Spoon Medication Administration Crushed with puree Compensations Slow rate;Small sips/bites Postural Changes Seated upright at 90 degrees   CHL IP OTHER RECOMMENDATIONS 04/30/2016 Recommended Consults -- Oral Care Recommendations Oral care BID Other Recommendations Order thickener from pharmacy;Prohibited food (jello, ice cream, thin soups);Remove water pitcher   CHL IP FOLLOW UP RECOMMENDATIONS 04/30/2016 Follow up Recommendations Inpatient Rehab   CHL IP FREQUENCY AND DURATION 04/30/2016 Speech Therapy Frequency (ACUTE ONLY) min 2x/week Treatment Duration 2 weeks      CHL IP ORAL PHASE 04/30/2016 Oral Phase Impaired Oral - Pudding Teaspoon -- Oral - Pudding Cup -- Oral - Honey Teaspoon Piecemeal swallowing Oral - Honey Cup Piecemeal swallowing Oral - Nectar Teaspoon Piecemeal swallowing Oral - Nectar Cup -- Oral - Nectar Straw -- Oral - Thin Teaspoon -- Oral - Thin Cup -- Oral - Thin Straw -- Oral - Puree Piecemeal swallowing Oral - Mech Soft Decreased bolus cohesion;Premature spillage Oral - Regular -- Oral - Multi-Consistency -- Oral - Pill -- Oral Phase - Comment --  CHL IP PHARYNGEAL PHASE 04/30/2016 Pharyngeal Phase Impaired Pharyngeal- Pudding Teaspoon -- Pharyngeal -- Pharyngeal- Pudding Cup -- Pharyngeal -- Pharyngeal- Honey Teaspoon  Reduced airway/laryngeal closure;Penetration/Aspiration during swallow Pharyngeal Material enters airway, remains ABOVE vocal cords then ejected out Pharyngeal- Honey Cup Reduced airway/laryngeal closure;Penetration/Aspiration during swallow Pharyngeal Material enters airway, remains ABOVE vocal cords then ejected out Pharyngeal- Nectar Teaspoon Reduced airway/laryngeal closure;Penetration/Aspiration during swallow;Compensatory strategies attempted (with notebox) Pharyngeal Material enters airway, passes BELOW cords without attempt by patient to eject out (silent aspiration);Material enters airway, remains ABOVE vocal cords and not ejected out Pharyngeal- Nectar Cup -- Pharyngeal -- Pharyngeal- Nectar Straw -- Pharyngeal -- Pharyngeal- Thin Teaspoon -- Pharyngeal -- Pharyngeal- Thin Cup -- Pharyngeal -- Pharyngeal- Thin Straw -- Pharyngeal -- Pharyngeal- Puree Reduced airway/laryngeal closure Pharyngeal -- Pharyngeal- Mechanical Soft Reduced airway/laryngeal closure Pharyngeal -- Pharyngeal- Regular -- Pharyngeal -- Pharyngeal- Multi-consistency -- Pharyngeal -- Pharyngeal- Pill -- Pharyngeal -- Pharyngeal Comment --  CHL IP CERVICAL ESOPHAGEAL PHASE 04/30/2016 Cervical Esophageal Phase WFL Pudding Teaspoon -- Pudding Cup -- Honey Teaspoon -- Honey Cup -- Nectar Teaspoon -- Nectar Cup -- Nectar Straw -- Thin Teaspoon -- Thin Cup -- Thin Straw -- Puree -- Mechanical Soft -- Regular -- Multi-consistency -- Pill -- Cervical Esophageal Comment -- No flowsheet data found. Germain Osgood 04/30/2016, 2:44 PM  Germain Osgood, M.A. CCC-SLP [phone]                 Medical Problem List and Plan: 1.  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  DVT Prophylaxis/Anticoagulation: Subcutaneous Lovenox. Monitor platelet counts and any signs of bleeding. Check vascular study 3. Pain Management: Tylenol as needed 4. Mood: Klonopin 2 mg twice a day, Prozac 30 mg daily,  Seroquel 100 mg twice a day 5. Neuropsych: This patient is not capable of making  decisions on her own behalf. 6. Skin/Wound Care: Routine skin checks 7. Fluids/Electrolytes/Nutrition: Routine I&O with follow-up chemistries 8. Tracheostomy 04/28/2016 per Dr. Titus Mould. #6 Shiley. Speech therapy follow-up for PMV 9. Dysphagia. Dysphagia #1 honey thick liquid diet. Follow-up speech therapy. Monitor hydration 10. MRSA PCR screen positive. Contact precautions 11. Polysubstance tobacco and alcohol abuse. Counseling 12.Tachycardia. Patient normal sinus rhythm. No acute distress. Monitor with increased mobility 13.Leukocytosis.WBC to 14,800 and improved from 21,000. Patient remains afebrile.   Post Admission Physician Evaluation: 1. Functional deficits secondary  to Hypoxic encephalopathy. 2. Patient is admitted to receive collaborative, interdisciplinary care between the physiatrist, rehab nursing staff, and therapy team. 3. Patient's level of medical complexity and substantial therapy needs in context of that medical necessity cannot be provided at a lesser intensity of care such as a SNF. 4. Patient has experienced substantial functional loss from his/her baseline which was documented above under the "Functional History" and "Functional Status" headings.  Judging by the patient's diagnosis, physical exam, and functional history, the patient has potential for functional progress which will result in measurable gains while on inpatient rehab.  These gains will be of substantial and practical use upon discharge  in facilitating mobility and self-care at the household level. 5. Physiatrist will provide 24 hour management of medical needs as well as oversight of the therapy plan/treatment and provide guidance as appropriate regarding the interaction of the two. 6. The Preadmission Screening has been reviewed and patient status is unchanged unless otherwise stated above. 7. 24 hour rehab nursing will assist  with bladder management, bowel management, safety, skin/wound care, disease management, medication administration, pain management and patient education  and help integrate therapy concepts, techniques,education, etc. 8. PT will assess and treat for/with: pre gait, gait training, endurance , safety, equipment, neuromuscular re education.   Goals are: Sup. 9. OT will assess and treat for/with: ADLs, Cognitive perceptual skills, Neuromuscular re education, safety, endurance, equipment.   Goals are: Sup. Therapy may not proceed with showering this patient. 10. SLP will assess and treat for/with: cognition, swallowing and speech.  Goals are: safe and adequate po intake, intelligible speech, . 11. Case Management and Social Worker will assess and treat for psychological issues and discharge planning. 12. Team conference will be held weekly to assess progress toward goals and to determine barriers to discharge. 13. Patient will receive at least 3 hours of therapy per day at least 5 days per week. 14. ELOS: 12-16d       15. Prognosis:  good     DAN ANGIULLI PA-C Charlett Blake, MD 05/01/2016

## 2016-05-01 NOTE — Progress Notes (Signed)
Pt arrived approx 1700 via bed from 3M07. Pt alert to self, slow response time, making needs known by nodding and using communication board for assistance.  Father and step mother at bedside.  Shiley #6 cuffed trach secured with ties, deflated at time of arrival, spare trach and supplies at bedside.  No skin issues.  ABD is non tender with BS x 4. Incontinent of bowel and bladder.  Full supervision with meals, honey thick liquids. Tele sitter at bedside and bed alarm on and functioning. TBI book provided.

## 2016-05-01 NOTE — Discharge Summary (Signed)
Physician Discharge Summary  Patient ID: Jane Watkins MRN: [id number] DOB/AGE: [birth year] 20 y.o.  Admit date: 04/19/2016 Discharge date: 05/01/2016  Problem List Active Problems:   Encephalopathy   Opiate overdose   Acute respiratory failure with hypoxia (HCC)   Drug overdose   Elevated liver enzymes   Encephalopathy acute   Elevated troponin   Opioid abuse   Non-traumatic rhabdomyolysis   Pneumonia of both lower lobes due to methicillin resistant Staphylococcus aureus (MRSA) (HCC)   Acute pulmonary edema (HCC)   Glasgow coma scale total score 3-8 (HCC)   Respiratory distress  HPI: Jane Watkins is a 21 y/o woman with a history of opiate abuse who was found next to her boyfriend (who was deceased). She was unresponsive. She was brought to [facility] where she was found to combative and was intubated. Her CK was 15k, and had a mild lactic acidosis. She was brought to [facility].  Hospital Course: STUDIES:  Head CT 3/10 >> Normal ECHO 3/11 >> LVEF 65-70%, no RWMA, no evidence of vegetation Continuous EEG 3/13 >> MRI 3/13 >> unremarkable appearance of the brain  CULTURES: Sputum 3/11 >> MRSA  BCx2 3/11 >>neg  UC 3/10 >> greater 100k coag neg staph >> sens vanco  ANTIBIOTICS: Unasyn 3/11 >> 3/14 Vancomycin 3/11 >> 05-21-2022  SIGNIFICANT EVENTS: 3/10  Admit after overdose 3/13  Neurology consulted, continuous EEG initiated 3/15 wild   3/16 not as agitated 2022/05/21 will try t collar 3/21 24 hours off vent. No feeding tube unable to place due to edema  LINES/TUBES: ETT 3/10 >> 3/14 ett 3/14(FAILED EXTUBATION)>>3/19 Trach (df) 3/19>>  DISCUSSION: 21 y/o p/w AMS and intoxication following apparent opiate O/D.  Person was deceased at her side. 05/21/2022 follows commands.   ASSESSMENT / PLAN:  PULMONARY A: Need for mechanical ventilation(extubated 3/14 and developed stridor and reintubated) Acute Respiratory failure, stridor post extubation RLL Aspiration PNA - mild airspace  disease Tobacco Abuse  3/16 increased FIO2 needs Trached 3/19 and required increased fio2 and peep 8 with possible mucus plug. Resolved 2022-05-21 ? If clonidine might help with wd sxs but BP low P:  T collar x 24 hours Leave off vent Decrease sedation.  Increase Seroquel 21-May-2022 to 100 mg bid(not getting 3/21) Mucomyst/percussion x 24 hours, complete 3/21 Leave trach in place due to upper airway edema/injury.  CARDIOVASCULAR A:  Troponinemia - likely demand ischemia P:  Resoved  RENAL  Last Labs    Recent Labs Lab 04/28/16 0519 20-May-2016 0500 04/30/16 0312  K 3.9 3.5 3.8      Last Labs    Recent Labs Lab 04/28/16 0519 May 20, 2016 0500 04/30/16 0312  NA 137 136 136          Filed Weights   04/28/16 0600 2016-05-20 0500 04/30/16 0500  Weight: 59 kg (130 lb 1.1 oz) 55 kg (121 lb 4.1 oz) 53 kg (116 lb 13.5 oz)    Intake/Output Summary (Last 24 hours) at 04/30/16 0747 Last data filed at 04/30/16 0700  Gross per 24 hour  Intake          1063.03 ml  Output             2245 ml  Net         -1181.97 ml     A:   Rhabdomyolysis(improving) resolved 3/16 ck 168 Lactic acidosis->resolved Hypokalemia  P:   Chem in am  IVF to 50. Hold lasix 05-21-2022. 3/21 autodiuresing   GASTROINTESTINAL A:   vomting (  from upper airway irritation?) P:   TF on hold, no feeding tube able to be placed NPO / no ogt reorder swallow eval PPI for SUP  HEMATOLOGIC A:   No active issues P:  Lovenox for DVT prophylaxis   INFECTIOUS A:   Suspected Aspiration - minimal airspace disease on CXR MRSA in Sputum  Coag Neg Staph UTI P:   vanc finished 3/20   ENDOCRINE A:   No active issues   P:   Trend glucose on BMP  NEUROLOGIC A:   Opiate Overdose  Acute Encephalopathy  Rule Out Anoxic Injury, not evident on head MRI 3/13 P:   RASS Goal: 0  Off sedation drips 3/21. Calmer and more interactive. No prn IV drugs overnight Thiamine/folic acid Increase Seroquel to  100mg  BID Increased Klonopin to 2 mg PO BID, will continue(note 3/21 no oral route available as no feeding tube) 3/20 dc continuous IV fentanyl and go to intermittent  She will need psych consult in future    Labs at discharge Lab Results  Component Value Date   CREATININE 0.39 (L) 05/01/2016   BUN 11 05/01/2016   NA 141 05/01/2016   K 3.8 05/01/2016   CL 107 05/01/2016   CO2 23 05/01/2016   Lab Results  Component Value Date   WBC 14.8 (H) 05/01/2016   HGB 13.3 05/01/2016   HCT 39.7 05/01/2016   MCV 89.2 05/01/2016   PLT 496 (H) 05/01/2016   Lab Results  Component Value Date   ALT 36 04/25/2016   AST 25 04/25/2016   ALKPHOS 33 (L) 04/25/2016   BILITOT 0.4 04/25/2016   Lab Results  Component Value Date   INR 1.08 04/28/2016    Current radiology studies Dg Chest Port 1 View  Result Date: 04/30/2016 CLINICAL DATA:  Respiratory failure. EXAM: PORTABLE CHEST 1 VIEW COMPARISON:  April 29, 2016 FINDINGS: Tracheostomy catheter tip is 5.3 cm above the Watkins. No pneumothorax. There is no edema or consolidation. Heart size and pulmonary vascularity are normal. No adenopathy. No bone lesions. IMPRESSION: Tracheostomy catheter as described without pneumothorax. No edema or consolidation. Stable cardiac silhouette. Electronically Signed   By: Bretta Bang III M.D.   On: 04/30/2016 08:37   Dg Swallowing Func-speech Pathology  Result Date: 04/30/2016 Objective Swallowing Evaluation: Type of Study: MBS-Modified Barium Swallow Study Patient Details Name: Jane Watkins MRN: [id number] Date of Birth: Jun 12, 1995 Today's Date: 04/30/2016 Time: SLP Start Time (ACUTE ONLY): 1325-SLP Stop Time (ACUTE ONLY): 1342 SLP Time Calculation (min) (ACUTE ONLY): 17 min Past Medical History: No past medical history on file. Past Surgical History: Past Surgical History: Procedure Laterality Date . APPENDECTOMY   . MOUTH SURGERY   HPI: 21 y/o F with a history of opiate abuse who was found unresponsive next  to her boyfriend (who was deceased). She was intubated 3/10-3/14 but failed extubation due to stridor/swelling and was reintubated 3/14 until trach 3/19.  Subjective: Jane Watkins alert, calm Assessment / Plan / Recommendation CHL IP CLINICAL IMPRESSIONS 04/30/2016 Clinical Impression Jane Watkins has a primarily pharyngeal dysphagia due to incomplete airway closure, suspect related to prolonged intubations and reduced subglottic pressure as PMV can not be worn. Nectar thick liquids are able to enter into the airway during the swallow with silent aspiration observed. A chin tuck still resulted in silent, frank penetration, and Jane Watkins is not able to expel penetrates with a cued cough. Trace penetration is observed with spoonfuls of honey thick liquids, but it is spontaneously ejected with additional swallows. Her  oral phase is mildly discoordinated with solid POs, resulting in mild premature spillage. Given the above and the likelihood that this is acute and reversible with good prognosis for advancement even as PMV can be tolerated, recommend a more conservative diet to start: Dys 1 textures, honey thick liquids by spoon. Will follow for tolerance and advancement as warranted. SLP Visit Diagnosis Dysphagia, pharyngeal phase (R13.13) Attention and concentration deficit following -- Frontal lobe and executive function deficit following -- Impact on safety and function Mild aspiration risk;Moderate aspiration risk   CHL IP TREATMENT RECOMMENDATION 04/30/2016 Treatment Recommendations Therapy as outlined in treatment plan below   Prognosis 04/30/2016 Prognosis for Safe Diet Advancement Good Barriers to Reach Goals -- Barriers/Prognosis Comment -- CHL IP DIET RECOMMENDATION 04/30/2016 SLP Diet Recommendations Dysphagia 1 (Puree) solids;Honey thick liquids Liquid Administration via Spoon Medication Administration Crushed with puree Compensations Slow rate;Small sips/bites Postural Changes Seated upright at 90 degrees   CHL IP OTHER RECOMMENDATIONS  04/30/2016 Recommended Consults -- Oral Care Recommendations Oral care BID Other Recommendations Order thickener from pharmacy;Prohibited food (jello, ice cream, thin soups);Remove water pitcher   CHL IP FOLLOW UP RECOMMENDATIONS 04/30/2016 Follow up Recommendations Inpatient Rehab   CHL IP FREQUENCY AND DURATION 04/30/2016 Speech Therapy Frequency (ACUTE ONLY) min 2x/week Treatment Duration 2 weeks      CHL IP ORAL PHASE 04/30/2016 Oral Phase Impaired Oral - Pudding Teaspoon -- Oral - Pudding Cup -- Oral - Honey Teaspoon Piecemeal swallowing Oral - Honey Cup Piecemeal swallowing Oral - Nectar Teaspoon Piecemeal swallowing Oral - Nectar Cup -- Oral - Nectar Straw -- Oral - Thin Teaspoon -- Oral - Thin Cup -- Oral - Thin Straw -- Oral - Puree Piecemeal swallowing Oral - Mech Soft Decreased bolus cohesion;Premature spillage Oral - Regular -- Oral - Multi-Consistency -- Oral - Pill -- Oral Phase - Comment --  CHL IP PHARYNGEAL PHASE 04/30/2016 Pharyngeal Phase Impaired Pharyngeal- Pudding Teaspoon -- Pharyngeal -- Pharyngeal- Pudding Cup -- Pharyngeal -- Pharyngeal- Honey Teaspoon Reduced airway/laryngeal closure;Penetration/Aspiration during swallow Pharyngeal Material enters airway, remains ABOVE vocal cords then ejected out Pharyngeal- Honey Cup Reduced airway/laryngeal closure;Penetration/Aspiration during swallow Pharyngeal Material enters airway, remains ABOVE vocal cords then ejected out Pharyngeal- Nectar Teaspoon Reduced airway/laryngeal closure;Penetration/Aspiration during swallow;Compensatory strategies attempted (with notebox) Pharyngeal Material enters airway, passes BELOW cords without attempt by patient to eject out (silent aspiration);Material enters airway, remains ABOVE vocal cords and not ejected out Pharyngeal- Nectar Cup -- Pharyngeal -- Pharyngeal- Nectar Straw -- Pharyngeal -- Pharyngeal- Thin Teaspoon -- Pharyngeal -- Pharyngeal- Thin Cup -- Pharyngeal -- Pharyngeal- Thin Straw -- Pharyngeal --  Pharyngeal- Puree Reduced airway/laryngeal closure Pharyngeal -- Pharyngeal- Mechanical Soft Reduced airway/laryngeal closure Pharyngeal -- Pharyngeal- Regular -- Pharyngeal -- Pharyngeal- Multi-consistency -- Pharyngeal -- Pharyngeal- Pill -- Pharyngeal -- Pharyngeal Comment --  CHL IP CERVICAL ESOPHAGEAL PHASE 04/30/2016 Cervical Esophageal Phase WFL Pudding Teaspoon -- Pudding Cup -- Honey Teaspoon -- Honey Cup -- Nectar Teaspoon -- Nectar Cup -- Nectar Straw -- Thin Teaspoon -- Thin Cup -- Thin Straw -- Puree -- Mechanical Soft -- Regular -- Multi-consistency -- Pill -- Cervical Esophageal Comment -- No flowsheet data found. Maxcine Hamaiewonsky, Laura 04/30/2016, 2:44 PM  Maxcine HamLaura Paiewonsky, M.A. CCC-SLP [phone]              Disposition: Rehabilitation   Medications: Current Facility-Administered Medications  Medication Dose Route Frequency Provider Last Rate Last Dose  . albuterol (PROVENTIL) (2.5 MG/3ML) 0.083% nebulizer solution 2.5 mg  2.5 mg Nebulization Q2H PRN Coralyn HellingVineet Sood, MD  2.5 mg at 04/30/16 0738  . bisacodyl (DULCOLAX) suppository 10 mg  10 mg Rectal Daily PRN Louann Sjogren, MD   10 mg at 04/28/16 1836  . chlorhexidine (PERIDEX) 0.12 % solution 15 mL  15 mL Mouth Rinse BID Merwyn Katos, MD   15 mL at 05/01/16 0915  . clonazePAM (KLONOPIN) disintegrating tablet 2 mg  2 mg Oral BID Alyson Reedy, MD   2 mg at 05/01/16 0916  . dextrose 5% lactated ringers 1,000 mL with potassium chloride 40 mEq infusion   Intravenous Continuous Merwyn Katos, MD 50 mL/hr at 05/01/16 0600    . enoxaparin (LOVENOX) injection 40 mg  40 mg Subcutaneous Q24H Jamie Kato, MD   40 mg at 05/01/16 0915  . FLUoxetine (PROZAC) 20 MG/5ML solution 30 mg  30 mg Oral Daily Jordan Hawks, DO      . folic acid (FOLVITE) tablet 1 mg  1 mg Oral Daily Jeanella Craze, NP   Stopped at 04/30/16 1000  . MEDLINE mouth rinse  15 mL Mouth Rinse q12n4p Merwyn Katos, MD   15 mL at 04/30/16 1600  . midazolam  (VERSED) injection 2 mg  2 mg Intravenous Q2H PRN Jose Alexis Frock, MD   2 mg at 04/30/16 1955  . multivitamin with minerals tablet 1 tablet  1 tablet Oral Daily Jeanella Craze, NP   1 tablet at 05/01/16 0916  . ondansetron (ZOFRAN) injection 4 mg  4 mg Intravenous Q6H PRN Coralyn Helling, MD   4 mg at 04/28/16 0007  . pantoprazole (PROTONIX) injection 40 mg  40 mg Intravenous Q24H Nelda Bucks, MD   40 mg at 05/01/16 0915  . QUEtiapine (SEROQUEL) tablet 100 mg  100 mg Oral BID Vilinda Blanks Jonpaul Lumm, NP   100 mg at 05/01/16 0916  . RESOURCE THICKENUP CLEAR   Oral PRN Nelda Bucks, MD      . sodium chloride flush (NS) 0.9 % injection 10-40 mL  10-40 mL Intracatheter Q12H Nelda Bucks, MD   10 mL at 05/01/16 0917  . sodium chloride flush (NS) 0.9 % injection 10-40 mL  10-40 mL Intracatheter PRN Nelda Bucks, MD      . thiamine (VITAMIN B-1) tablet 100 mg  100 mg Oral Daily Jeanella Craze, NP   100 mg at 05/01/16 2130    Discharged Condition: fair  Time spent on discharge greater than 40 minutes.  Vital signs at Discharge. Temp:  [97.9 F (36.6 C)-100.2 F (37.9 C)] 100.2 F (37.9 C) (03/22 0800) Pulse Rate:  [77-109] 94 (03/22 0900) Resp:  [8-28] 28 (03/22 1000) BP: (98-124)/(49-97) 117/85 (03/22 1000) SpO2:  [95 %-100 %] 100 % (03/22 0900) FiO2 (%):  [21 %] 21 % (03/21 2000) Weight:  [51.6 kg (113 lb 12.1 oz)] 51.6 kg (113 lb 12.1 oz) (03/22 0500) Office follow up Special Information or instructions. Per Rehab services Signed: Brett Canales Rogina Schiano ACNP Adolph Pollack PCCM Pager [phone] till 3 pm If no answer page [phone] 05/01/2016, 11:39 AM
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
## 2016-05-01 NOTE — Progress Notes (Signed)
Rehab admissions - I have approval from insurance carrier for acute inpatient rehab admission.  I will check with MD to determine medical readiness.  I do have a rehab bed available today for patient.  Call me for questions.  #[phone]

## 2016-05-02 ENCOUNTER — Inpatient Hospital Stay (HOSPITAL_COMMUNITY): Payer: 59 | Admitting: Occupational Therapy

## 2016-05-02 ENCOUNTER — Inpatient Hospital Stay (HOSPITAL_COMMUNITY): Payer: 59 | Admitting: Speech Pathology

## 2016-05-02 ENCOUNTER — Inpatient Hospital Stay (HOSPITAL_COMMUNITY): Payer: 59 | Admitting: Physical Therapy

## 2016-05-02 ENCOUNTER — Inpatient Hospital Stay (HOSPITAL_COMMUNITY): Payer: 59

## 2016-05-02 DIAGNOSIS — M7989 Other specified soft tissue disorders: Secondary | ICD-10-CM

## 2016-05-02 DIAGNOSIS — T40601S Poisoning by unspecified narcotics, accidental (unintentional), sequela: Secondary | ICD-10-CM

## 2016-05-02 DIAGNOSIS — R1312 Dysphagia, oropharyngeal phase: Secondary | ICD-10-CM

## 2016-05-02 LAB — COMPREHENSIVE METABOLIC PANEL
ALK PHOS: 58 U/L (ref 38–126)
ALT: 82 U/L — ABNORMAL HIGH (ref 14–54)
ANION GAP: 13 (ref 5–15)
AST: 30 U/L (ref 15–41)
Albumin: 3.7 g/dL (ref 3.5–5.0)
BUN: 14 mg/dL (ref 6–20)
CALCIUM: 9.7 mg/dL (ref 8.9–10.3)
CO2: 21 mmol/L — AB (ref 22–32)
Chloride: 105 mmol/L (ref 101–111)
Creatinine, Ser: 0.44 mg/dL (ref 0.44–1.00)
Glucose, Bld: 88 mg/dL (ref 65–99)
Potassium: 4.1 mmol/L (ref 3.5–5.1)
SODIUM: 139 mmol/L (ref 135–145)
TOTAL PROTEIN: 7.5 g/dL (ref 6.5–8.1)
Total Bilirubin: 1.3 mg/dL — ABNORMAL HIGH (ref 0.3–1.2)

## 2016-05-02 LAB — CBC WITH DIFFERENTIAL/PLATELET
BASOS ABS: 0 10*3/uL (ref 0.0–0.1)
Basophils Relative: 0 %
Eosinophils Absolute: 0.2 10*3/uL (ref 0.0–0.7)
Eosinophils Relative: 2 %
HEMATOCRIT: 26.3 % — AB (ref 36.0–46.0)
Hemoglobin: 8.8 g/dL — ABNORMAL LOW (ref 12.0–15.0)
LYMPHS PCT: 27 %
Lymphs Abs: 2 10*3/uL (ref 0.7–4.0)
MCH: 29.8 pg (ref 26.0–34.0)
MCHC: 33.5 g/dL (ref 30.0–36.0)
MCV: 89.2 fL (ref 78.0–100.0)
MONO ABS: 0.6 10*3/uL (ref 0.1–1.0)
MONOS PCT: 9 %
NEUTROS ABS: 4.5 10*3/uL (ref 1.7–7.7)
Neutrophils Relative %: 62 %
Platelets: 385 10*3/uL (ref 150–400)
RBC: 2.95 MIL/uL — ABNORMAL LOW (ref 3.87–5.11)
RDW: 13.1 % (ref 11.5–15.5)
WBC: 7.2 10*3/uL (ref 4.0–10.5)

## 2016-05-02 LAB — CBC
HCT: 37.5 % (ref 36.0–46.0)
HEMOGLOBIN: 12.9 g/dL (ref 12.0–15.0)
MCH: 30.5 pg (ref 26.0–34.0)
MCHC: 34.4 g/dL (ref 30.0–36.0)
MCV: 88.7 fL (ref 78.0–100.0)
Platelets: 590 10*3/uL — ABNORMAL HIGH (ref 150–400)
RBC: 4.23 MIL/uL (ref 3.87–5.11)
RDW: 13 % (ref 11.5–15.5)
WBC: 11.5 10*3/uL — ABNORMAL HIGH (ref 4.0–10.5)

## 2016-05-02 NOTE — Progress Notes (Signed)
*  PRELIMINARY RESULTS* Vascular Ultrasound Bilateral lower extremity venous duplex has been completed.  Preliminary findings: No evidence of deep vein thrombosis or baker's cysts bilaterally.    Jane FischerCharlotte C Raghav Watkins 05/02/2016, 9:47 AM
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
## 2016-05-02 NOTE — Plan of Care (Signed)
Problem: RH SAFETY Goal: RH STG ADHERE TO SAFETY PRECAUTIONS W/ASSISTANCE/DEVICE STG Adhere to Safety Precautions With max assist, telesitter, family at bedside Outcome: Progressing Max assist for safety with telesitter, family at bedside

## 2016-05-02 NOTE — Evaluation (Signed)
Speech Language Pathology Assessment and Plan  Patient Details  Name: Jane Watkins MRN: [id number] Date of Birth: 10/05/1995  SLP Diagnosis: Speech and Language deficits;Dysphagia  Rehab Potential: Good ELOS: 3 weeks    Today's Date: 05/02/2016 SLP Individual Time: 1445-1540 SLP Individual Time Calculation (min): 55 min   Problem List:  Patient Active Problem List   Diagnosis Date Noted  . TBI (traumatic brain injury) ([place]) 05/01/2016  . 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 Respiratory distress   . Glasgow coma scale total score 3-8 ([place])   . Pneumonia of both lower lobes due to methicillin resistant Staphylococcus aureus (MRSA) ([place])   . Acute pulmonary edema ([facility])   . Non-traumatic rhabdomyolysis   . Opioid abuse   . Elevated troponin   . Acute respiratory failure with hypoxia ([place])   . Drug overdose   . Elevated liver enzymes   . Encephalopathy acute   . Encephalopathy 04/19/2016  . Opiate overdose 04/19/2016   Past Medical History: No past medical history on file. Past Surgical History:  Past Surgical History:  Procedure Laterality Date  . APPENDECTOMY    . MOUTH SURGERY      Assessment / Plan / Recommendation Clinical Impression Attempts to place a nasogastric tube was unsuccessful currently on IV fluids for hydration, modified barium swallow completed 04/30/2016 and placed on a dysphagia #1 honey thick liquid diet. MRSA PCR screen positive maintained on contact precautions.UC 04/19/2016 greater than 100,000 coag negative staph completed course of vancomycin 04/29/2016.Patient with mild tachycardia when up with therapies no acute distress normal sinus rhythm and continued to be monitored.Physical andOccupational therapy evaluationscompleted with recommendations of physical medicine rehabilitation consult.Patient was admitted for a comprehensive rehabilitation program on 05/01/16.   Bedside Swallow and speech-language evaluations completed on 05/02/16. Pt  presents with significant cognitive deficits in the area of focused attention/alertness. Pt's inability to focus attention for less than 1 minute required that pt be a Total A for all areas of cognitive function. On one occasion, pt able to write that she had to pee. Pt unable to tolerate finger occlusion as evidenced by explosive air trapping indicative of no upper airway patency. Pt able to focus attention for several boluses of honey thick liquids via spoon without any respiratory decline or overt s/s of aspiration. Pt requires skilled ST to address all areas of speech-language function and advance swallow function to increase functional independence and reduce caregiver burden. Anticipate that pt will require 24 hour supervision and follow up ST services at time of discharge.    Skilled Therapeutic Interventions          Skilled treatment session focused on completion of bedside swallow and speech-language evaluations, see above. Pt with decreased focused attention to < 1 minute and was Total A for all functional tasks. Pt unable to demonstrate upper airway patency as evidenced by explosive air trapping. Pt to attempt trials of PMSV with SLP only.    SLP Assessment  Patient will need skilled Speech Lanaguage Pathology Services during CIR admission    Recommendations  Patient may use Passy-Muir Speech Valve: with SLP only PMSV Supervision: Full MD: Please consider changing trach tube to : Smaller size;Cuffless SLP Diet Recommendations: Dysphagia 1 (Puree);Honey Liquid Administration via: Spoon Medication Administration: Crushed with puree Supervision: Full supervision/cueing for compensatory strategies;Staff to assist with self feeding Compensations: Minimize environmental distractions;Slow rate;Small sips/bites Postural Changes and/or Swallow Maneuvers: Seated upright 90 degrees Oral Care Recommendations: Oral care QID Recommendations for Other Services: Neuropsych consult Patient destination:  Home Follow up Recommendations: 24 hour supervision/assistance;Outpatient SLP Equipment Recommended: To be determined    SLP Frequency 3 to 5 out of 7 days   SLP Duration  SLP Intensity  SLP Treatment/Interventions 3 weeks  Minumum of 1-2 x/day, 30 to 90 minutes  Cognitive remediation/compensation;Cueing hierarchy;DME/adaptive equipment instruction;Dysphagia/aspiration precaution training;Environmental controls;Functional tasks;Internal/external aids;Medication managment;Multimodal communication approach;Patient/family education;Speech/Language facilitation;Therapeutic Activities    Pain Pain Assessment Pain Assessment: No/denies pain  Prior Functioning Cognitive/Linguistic Baseline: Within functional limits Type of Home: Apartment  Lives With: Alone Available Help at Discharge: Family;Available 24 hours/day Education: High school Vocation: Full time employment  Function:  Eating Eating   Modified Consistency Diet: Yes Eating Assist Level: Helper feeds patient;Helper checks for pocketed food;Supervision or verbal cues;More than reasonable amount of time;Helper scoops food on utensil;Helper brings food to mouth   Eating Set Up Assist For: Opening containers Helper [place] on Utensil: Every scoop Helper [place] to Mouth: Every scoop   Cognition Comprehension Comprehension assist level: Understands basic 90% of the time/cues < 10% of the time  Expression Expression assistive device: Talk trach valve Expression assist level: Expresses basic 25 - 49% of the time/requires cueing 50 - 75% of the time. Uses single words/gestures.  Social Interaction Social Interaction assist level: Interacts appropriately 50 - 74% of the time - May be physically or verbally inappropriate.  Problem Solving Problem solving assist level: Solves basic less than 25% of the time - needs direction nearly all the time or does not effectively solve problems and may need a restraint for safety   Memory Memory assist level: Recognizes or recalls less than 25% of the time/requires cueing greater than 75% of the time   Short Term Goals: Week 1: SLP Short Term Goal 1 (Week 1): Pt will consume puree with honey thick with Max A multimodal cues for compensatory swallow strategies.  SLP Short Term Goal 2 (Week 1): Pt will consume trials of ice chips without overt s/s of aspiration to demonstrate readiness for repeat instrumental study.  SLP Short Term Goal 3 (Week 1): Pt will tolerate PMSV placement for 1 to 2 breath cycles without respiratory decline.  SLP Short Term Goal 4 (Week 1): Pt will demonstrate focused attention for 2 minutes with supervision cues.  SLP Short Term Goal 5 (Week 1): Pt will use multimodal communication to communicate basic wants and needs with ~ 50% accuracy and Min A cues.   Refer to Care Plan for Long Term Goals  Recommendations for other services: Neuropsych  Discharge Criteria: Patient will be discharged from SLP if patient refuses treatment 3 consecutive times without medical reason, if treatment goals not met, if there is a change in medical status, if patient makes no progress towards goals or if patient is discharged from hospital.  The above assessment, treatment plan, treatment alternatives and goals were discussed and mutually agreed upon: by patient and by family   Jane Watkins, M.S., CCC-SLP Speech-Language Pathologist   Jane Watkins 05/02/2016, 4:48 PM

## 2016-05-02 NOTE — Progress Notes (Signed)
OT notified RN that pt was not responding with multiple attempts. Pt responded to RN with sternal rub and had good pupillary response. Pt's vitals are stable. Trach care completed and pt responded to pain and demonstrated a strong cough that was productive. Pt lethargic but arousable. Will continue to monitor.

## 2016-05-02 NOTE — Progress Notes (Signed)
Patient information reviewed and entered into e[facility] system by Kaulana Brindle, RN, CRRN, PPS Coordinator.  Information including medical coding and functional independence measure will be reviewed and updated through discharge.    

## 2016-05-02 NOTE — Progress Notes (Signed)
[facility] PHYSICAL MEDICINE & REHABILITATION     PROGRESS NOTE    Subjective/Complaints: Pt side-lying in bed in fetal position putting on shirt with therapy. Apparently slept last night  ROS: Limited due cognitive/behavioral   Objective: Vital Signs: Blood pressure 109/67, pulse 95, temperature 98.3 F (36.8 C), temperature source Oral, resp. rate 18, height 5\' 2"  (1.575 m), weight 52.2 kg (115 lb), SpO2 100 %. Dg Swallowing Func-speech Pathology  Result Date: 04/30/2016 Objective Swallowing Evaluation: Type of Study: MBS-Modified Barium Swallow Study Patient Details Name: Jane Watkins MRN: [id number] Date of Birth: February 09, 1996 Today's Date: 04/30/2016 Time: SLP Start Time (ACUTE ONLY): 1325-SLP Stop Time (ACUTE ONLY): 1342 SLP Time Calculation (min) (ACUTE ONLY): 17 min Past Medical History: No past medical history on file. Past Surgical History: Past Surgical History: Procedure Laterality Date . APPENDECTOMY   . MOUTH SURGERY   HPI: 21 y/o F with a history of opiate abuse who was found unresponsive next to her boyfriend (who was deceased). She was intubated 3/10-3/14 but failed extubation due to stridor/swelling and was reintubated 3/14 until trach 3/19.  Subjective: pt alert, calm Assessment / Plan / Recommendation CHL IP CLINICAL IMPRESSIONS 04/30/2016 Clinical Impression Pt has a primarily pharyngeal dysphagia due to incomplete airway closure, suspect related to prolonged intubations and reduced subglottic pressure as PMV can not be worn. Nectar thick liquids are able to enter into the airway during the swallow with silent aspiration observed. A chin tuck still resulted in silent, frank penetration, and pt is not able to expel penetrates with a cued cough. Trace penetration is observed with spoonfuls of honey thick liquids, but it is spontaneously ejected with additional swallows. Her oral phase is mildly discoordinated with solid POs, resulting in mild premature spillage. Given the above and  the likelihood that this is acute and reversible with good prognosis for advancement even as PMV can be tolerated, recommend a more conservative diet to start: Dys 1 textures, honey thick liquids by spoon. Will follow for tolerance and advancement as warranted. SLP Visit Diagnosis Dysphagia, pharyngeal phase (R13.13) Attention and concentration deficit following -- Frontal lobe and executive function deficit following -- Impact on safety and function Mild aspiration risk;Moderate aspiration risk   CHL IP TREATMENT RECOMMENDATION 04/30/2016 Treatment Recommendations Therapy as outlined in treatment plan below   Prognosis 04/30/2016 Prognosis for Safe Diet Advancement Good Barriers to Reach Goals -- Barriers/Prognosis Comment -- CHL IP DIET RECOMMENDATION 04/30/2016 SLP Diet Recommendations Dysphagia 1 (Puree) solids;Honey thick liquids Liquid Administration via Spoon Medication Administration Crushed with puree Compensations Slow rate;Small sips/bites Postural Changes Seated upright at 90 degrees   CHL IP OTHER RECOMMENDATIONS 04/30/2016 Recommended Consults -- Oral Care Recommendations Oral care BID Other Recommendations Order thickener from pharmacy;Prohibited food (jello, ice cream, thin soups);Remove water pitcher   CHL IP FOLLOW UP RECOMMENDATIONS 04/30/2016 Follow up Recommendations Inpatient Rehab   CHL IP FREQUENCY AND DURATION 04/30/2016 Speech Therapy Frequency (ACUTE ONLY) min 2x/week Treatment Duration 2 weeks      CHL IP ORAL PHASE 04/30/2016 Oral Phase Impaired Oral - Pudding Teaspoon -- Oral - Pudding Cup -- Oral - Honey Teaspoon Piecemeal swallowing Oral - Honey Cup Piecemeal swallowing Oral - Nectar Teaspoon Piecemeal swallowing Oral - Nectar Cup -- Oral - Nectar Straw -- Oral - Thin Teaspoon -- Oral - Thin Cup -- Oral - Thin Straw -- Oral - Puree Piecemeal swallowing Oral - Mech Soft Decreased bolus cohesion;Premature spillage Oral - Regular -- Oral - Multi-Consistency -- Oral - Pill --  Oral Phase -  Comment --  CHL IP PHARYNGEAL PHASE 04/30/2016 Pharyngeal Phase Impaired Pharyngeal- Pudding Teaspoon -- Pharyngeal -- Pharyngeal- Pudding Cup -- Pharyngeal -- Pharyngeal- Honey Teaspoon Reduced airway/laryngeal closure;Penetration/Aspiration during swallow Pharyngeal Material enters airway, remains ABOVE vocal cords then ejected out Pharyngeal- Honey Cup Reduced airway/laryngeal closure;Penetration/Aspiration during swallow Pharyngeal Material enters airway, remains ABOVE vocal cords then ejected out Pharyngeal- Nectar Teaspoon Reduced airway/laryngeal closure;Penetration/Aspiration during swallow;Compensatory strategies attempted (with notebox) Pharyngeal Material enters airway, passes BELOW cords without attempt by patient to eject out (silent aspiration);Material enters airway, remains ABOVE vocal cords and not ejected out Pharyngeal- Nectar Cup -- Pharyngeal -- Pharyngeal- Nectar Straw -- Pharyngeal -- Pharyngeal- Thin Teaspoon -- Pharyngeal -- Pharyngeal- Thin Cup -- Pharyngeal -- Pharyngeal- Thin Straw -- Pharyngeal -- Pharyngeal- Puree Reduced airway/laryngeal closure Pharyngeal -- Pharyngeal- Mechanical Soft Reduced airway/laryngeal closure Pharyngeal -- Pharyngeal- Regular -- Pharyngeal -- Pharyngeal- Multi-consistency -- Pharyngeal -- Pharyngeal- Pill -- Pharyngeal -- Pharyngeal Comment --  CHL IP CERVICAL ESOPHAGEAL PHASE 04/30/2016 Cervical Esophageal Phase WFL Pudding Teaspoon -- Pudding Cup -- Honey Teaspoon -- Honey Cup -- Nectar Teaspoon -- Nectar Cup -- Nectar Straw -- Thin Teaspoon -- Thin Cup -- Thin Straw -- Puree -- Mechanical Soft -- Regular -- Multi-consistency -- Pill -- Cervical Esophageal Comment -- No flowsheet data found. Maxcine Hamaiewonsky, Laura 04/30/2016, 2:44 PM  Maxcine HamLaura Paiewonsky, M.A. CCC-SLP [phone]              Recent Labs  05/02/16 0605 05/02/16 0743  WBC 7.2 11.5*  HGB 8.8* 12.9  HCT 26.3* 37.5  PLT 385 590*    Recent Labs  05/01/16 0339 05/02/16 0743  NA 141 139   K 3.8 4.1  CL 107 105  GLUCOSE 102* 88  BUN 11 14  CREATININE 0.39* 0.44  CALCIUM 9.6 9.7   CBG (last 3)   Recent Labs  04/30/16 2308 05/01/16 0809 05/01/16 1154  GLUCAP 90 79 92    Wt Readings from Last 3 Encounters:  05/01/16 52.2 kg (115 lb)  05/01/16 51.6 kg (113 lb 12.1 oz)  02/02/16 52.2 kg (115 lb)    Physical Exam:  HENT.  Head. Normocephalic Eyes. Pupils round and reactive to light Neck. Tracheostomy tube in place, open Cardiovascular. RRR.   Respiratory. Clear to aus bilaterally GI. Soft. Bowel sounds normal. She exhibits no distention Skin. Warm and dry Neurological. Makes eye contact. No CN abnl.  Follows simple commands. Has difficulty initiating or sequencing more complex tasks. Moves all 4's with at least 4- to 4/5 strength. Senses pain in all 4's. No resting tone or clonus seen. Psych: very flat and withdrawn.   Assessment/Plan: 1. Functional and cognitive deficits secondary to anoxic brain injury which require 3+ hours per day of interdisciplinary therapy in a comprehensive inpatient rehab setting. Physiatrist is providing close team supervision and 24 hour management of active medical problems listed below. Physiatrist and rehab team continue to assess barriers to discharge/monitor patient progress toward functional and medical goals.  Function:  Bathing Bathing position      Bathing parts      Bathing assist        Upper Body Dressing/Undressing Upper body dressing   What is the patient wearing?: Hospital gown                Upper body assist Assist Level: 2 helpers      Lower Body Dressing/Undressing Lower body dressing   What [facility] the patient wearing?: [facility]  Lower body assist Assist for lower body dressing: 2 Helpers      Toileting Toileting Toileting activity did not occur: No continent bowel/bladder event        Toileting assist     Transfers Chair/bed transfer  Chair/bed transfer activity did not occur: N/A           Lobbyist          Cognition Comprehension Comprehension assist level: Follows basic conversation/direction with extra time/assistive device  Expression Expression assist level: Expresses basis less than 25% of the time/requires cueing >75% of the time.  Social Interaction Social Interaction assist level: Interacts appropriately 25 - 49% of time - Needs frequent redirection.  Problem Solving Problem solving assist level: Solves basic less than 25% of the time - needs direction nearly all the time or does not effectively solve problems and may need a restraint for safety  Memory Memory assist level: Recognizes or recalls 25 - 49% of the time/requires cueing 50 - 75% of the time   Medical Problem List and Plan: 1.  Hypoxic encephalopathy cognitive deficits, balance disorder secondary to drug overdose  -begin CIR therapies today 2.  DVT Prophylaxis/Anticoagulation: Subcutaneous Lovenox. Monitor platelet counts and any signs of bleeding. Dopplers pending 3. Pain Management: Tylenol as needed 4. Mood: Klonopin 2 mg twice a day, Prozac 30 mg daily, Seroquel 100 mg twice a day 5. Neuropsych: This patient is not capable of making decisions on her own behalf. 6. Skin/Wound Care: Routine skin checks 7. Fluids/Electrolytes/Nutrition: Routine I&O. Push po---eating little so far  -I personally reviewed the patient's labs today.  8. Tracheostomy 04/28/2016 per Dr. Tyson Alias. #6 [place]. Begin plugging trials. 9. Dysphagia. Dysphagia #1 honey thick liquid diet.   -encourage fluids. Electrolytes and renal functional normal upon review of labs  -IVF HS 10. MRSA PCR screen positive. Contact precautions 11. Polysubstance tobacco and alcohol abuse. Counseling 12.Tachycardia. Patient normal sinus rhythm. No acute distress. Monitor with increased mobility 13.Leukocytosis.WBC up to 11.5k.  Patient remains  afebrile. No clinical signs of infection   LOS (Days) 1 A FACE TO FACE EVALUATION WAS PERFORMED  Faith Rogue T, MD 05/02/2016 10:14 AM

## 2016-05-02 NOTE — Evaluation (Signed)
Physical Therapy Assessment and Plan  Patient Details  Name: Jane Watkins MRN: [id number] Date of Birth: [birth year]  PT Diagnosis: Abnormal posture, Abnormality of gait, Cognitive deficits, Coordination disorder, Difficulty walking and Muscle weakness Rehab Potential: Good ELOS: 21-24 days   Today's Date: 05/02/2016 PT Individual Time: 0800-0900 PT Individual Time Calculation (min): 60 min    Problem List:  Patient Active Problem List   Diagnosis Date Noted  . TBI (traumatic brain injury) ([place]) 05/01/2016  . 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 Respiratory distress   . Glasgow coma scale total score 3-8 ([place])   . Pneumonia of both lower lobes due to methicillin resistant Staphylococcus aureus (MRSA) ([place])   . Acute pulmonary edema ([facility])   . Non-traumatic rhabdomyolysis   . Opioid abuse   . Elevated troponin   . Acute respiratory failure with hypoxia ([place])   . Drug overdose   . Elevated liver enzymes   . Encephalopathy acute   . Encephalopathy 04/19/2016  . Opiate overdose 04/19/2016    Past Medical History: No past medical history on file. Past Surgical History:  Past Surgical History:  Procedure Laterality Date  . APPENDECTOMY    . MOUTH SURGERY      Assessment & Plan Clinical Impression: A20 y.o.right handed femalewith history of opiate abuse as well as depression and tobacco abuse. Per chart review patient lived alone and was independent prior to admission. Current plan is to discharge to her apartment with her father staying with her and providing assistance or she may discharge to her father and stepmom's home who can provide care. Presented 04/19/2016 after being found next to her boyfriend who was deceased. She was unresponsive. She was brought initially to [facility] combative requiring intubation. Her CK was 15,000 with mild lactic acidosis.Mildly elevated troponin felt to be related to demand ischemia.Cranial CT scan negative. Follow-up MRI  of the brain unremarkable 04/22/2016. UDS positive for opiates. EEG consistent with severe nonspecific diffuse cerebral dysfunction with no seizure activity.Echocardiogram with ejection fraction of 70% no wall motion abnormalities.Maintain on ventilatory support with failed extubation and ultimately with percutaneous tracheostomy placement 04/28/2016. Attempts to place a nasogastric tube was unsuccessful currently on IV fluids for hydration and plan modified barium swallowcompleted 04/30/2016 and placed on a dysphagia #1 honey thick liquid diet. Marland KitchenMRSA PCR screen positive maintained on contact precautions. UC03/11/2016 greater than 100,000 coag negative staph completedcourse of vancomycin 04/29/2016. Patient with mild tachycardia when up with therapies no acute distress normal sinus rhythm and continued to be monitored.Subcutaneous Lovenox for DVT prophylaxis.Physical andOccupational therapy evaluationscompleted with recommendations of physical medicine rehabilitation consult. Patient to beadmitted for a comprehensive inpatient rehabilitation program. Patient transferred to CIR on 05/01/2016 .   Patient currently requires mod with mobility secondary to muscle weakness and muscle joint tightness, decreased cardiorespiratoy endurance, decreased initiation, decreased attention, decreased awareness, decreased problem solving, decreased safety awareness, decreased memory and delayed processing and decreased sitting balance, decreased standing balance, decreased postural control and decreased balance strategies.  Prior to hospitalization, patient was independent  with mobility and lived with Alone in a [place] home.  Home access is 1 step at entryStairs to enter.  Patient will benefit from skilled PT intervention to maximize safe functional mobility, minimize fall risk and decrease caregiver burden for planned discharge home with 24 hour supervision.  Anticipate patient will benefit from follow up OP  at discharge.  PT - End of Session Activity Tolerance: Tolerates 30+ min activity with multiple rests Endurance Deficit: Yes Endurance Deficit  Description: d/t physical limitations as well as cognitive fatigue PT Assessment Rehab Potential (ACUTE/IP ONLY): Good PT Patient demonstrates impairments in the following area(s): Balance;Behavior;Endurance;Motor;Perception;Safety PT Transfers Functional Problem(s): Bed Mobility;Bed to Chair;Car;Furniture PT Locomotion Functional Problem(s): Ambulation;Stairs;Wheelchair Mobility PT Plan PT Intensity: Minimum of 1-2 x/day ,45 to 90 minutes PT Frequency: 5 out of 7 days PT Duration Estimated Length of Stay: 21-24 days PT Treatment/Interventions: Ambulation/gait training;Balance/vestibular training;Cognitive remediation/compensation;Community reintegration;Functional electrical stimulation;Patient/family education;Stair training;UE/LE Coordination activities;UE/LE Strength taining/ROM;Splinting/orthotics;Pain management;DME/adaptive equipment instruction;Neuromuscular re-education;Disease management/prevention;Discharge planning;Functional mobility training;Psychosocial support;Therapeutic Activities;Visual/perceptual remediation/compensation;Wheelchair propulsion/positioning;Therapeutic Exercise;Skin care/wound management PT Transfers Anticipated Outcome(s): S PT Locomotion Anticipated Outcome(s): S with LRAD ambulatory PT Recommendation Recommendations for Other Services: Neuropsych consult;Therapeutic Recreation consult Therapeutic Recreation Interventions: Pet therapy;Stress management;Outing/community reintergration Follow Up Recommendations: Outpatient PT Patient destination: Home Equipment Recommended: To be determined  Skilled Therapeutic Intervention Pt received sidelying in fetal position in bed; awake and responsive to therapist talking. Pt's mother present but asleep for majority of session until the end. Pt's female friend in room; unable to  provide details about d/c plan but is supportive and encouraging throughout session, reports that pt's activity during session is much improved over past several days. Pt performed dressing with modA lower body after pt threading BLEs into one leg opening and required max cues to redirect and allow therapist to assist. Setup/S for upper body with assist needed from nursing to manage IV. Stand pivot transfer modA with pt's LUE over therapist's shoulder, LLE buckled and decreased coordination for foot placement. Once in chair, pt indicated she wanted to brush teeth; setupA with suction brush and pt performed brushing without assist. Seated at sink pt initiates washing face, hands, managing sink faucet, reaching for towels, etc without cueing. Pt propelled w/c with minA due to decreased coordination R vs L hand for navigation. Gait x15' with modA, LUE over therapist's shoulder and +2A for IV management and w/c follow. Pt with antalgic gait pattern LLE; held LE In knee/hip flexion, ankle plantarflexion with poor weight bearing through L side. Unable to determine cause of gait pattern, pt appears to indicate pain/discomfort at L knee however unclear. Pt remained seated in w/c at end of session, quick release belt intact and family present. Pt verbalized during session "Can I go home in three days if I do everything I'm supposed to" demonstrating improving awareness, however did suggest to pt that she will need longer than 3 days in rehab before going home. Discussed goals of therapy, estimated LOB TBD following OT/SLP evaluations, and falls prevention safety with nursing/therapy staff to assist with all transfers; pt's mother in agreement.   PT Evaluation Precautions/Restrictions Precautions Precautions: Fall;Other (comment) Precaution Comments: Trach Restrictions Weight Bearing Restrictions: No General Chart Reviewed: Yes PT Amount of Missed Time (min): 45 Minutes PT Missed Treatment Reason: Other  (Comment) Response to Previous Treatment: Not applicable Family/Caregiver Present: Yes   Vital SignsTherapy Vitals Pulse Rate: 87 Resp: 18 BP: 110/69 Patient Position (if appropriate): Lying Oxygen Therapy SpO2: 99 % O2 Device: Not Delivered Pain Pain Assessment Pain Assessment: No/denies pain Home Living/Prior Functioning Home Living Available Help at Discharge: Family;Available 24 hours/day Type of Home: Apartment Home Access: Stairs to enter Entrance Stairs-Number of Steps: 1 step at entry Home Layout: One level Bathroom Shower/Tub: Tub/shower unit;Walk-in shower Bathroom Toilet: Standard Additional Comments: Pt will either discharge to her apt with her dad staying with her, or she may discharge to her dad and stepmom's home. She will have 24/7 assist.   Lives With: Alone Prior Function  Vocation: Full time employment Vision/Perception  Vision - Assessment Additional Comments: unable to accurately assess  Cognition Overall Cognitive Status: Impaired/Different from baseline Arousal/Alertness: Lethargic Orientation Level: Oriented to person Attention: Focused Focused Attention: Impaired Focused Attention Impairment: Verbal basic;Functional basic Sustained Attention: Impaired Sustained Attention Impairment: Verbal basic;Functional basic Memory: Impaired Memory Impairment: Decreased recall of new information Awareness: Impaired Awareness Impairment: Intellectual impairment Problem Solving: Impaired Problem Solving Impairment: Verbal basic Executive Function:  (all areas impacted by lethargy) Behaviors: Impulsive;Poor frustration tolerance;Restless Safety/Judgment: Impaired Sensation Sensation Additional Comments: none formally assessed d/t cognitive/communication deficits Coordination Gross Motor Movements are Fluid and Coordinated: No Motor  Motor Motor: Abnormal postural alignment and control;Hemiplegia;Motor impersistence Motor - Skilled Clinical Observations:  difficult to formally assess; apparent L side weakness>R during functional tasks  Mobility Bed Mobility Bed Mobility: Rolling Right;Rolling Left;Supine to Sit;Sit to Supine Rolling Right: 5: Supervision Rolling Right Details: Verbal cues for precautions/safety Rolling Left: 5: Supervision Rolling Left Details: Verbal cues for precautions/safety Supine to Sit: 4: Min guard;HOB flat Sit to Supine: 4: Min guard;HOB flat Transfers Transfers: Yes Sit to Stand: 4: Min assist Sit to Stand Details: Verbal cues for precautions/safety;Verbal cues for technique;Tactile cues for weight shifting Stand to Sit: 4: Min assist Stand to Sit Details (indicate cue type and reason): Verbal cues for technique;Tactile cues for weight shifting;Verbal cues for precautions/safety Stand Pivot Transfers: 3: Mod assist Stand Pivot Transfer Details: Verbal cues for technique;Verbal cues for precautions/safety;Tactile cues for posture;Tactile cues for sequencing;Manual facilitation for placement Stand Pivot Transfer Details (indicate cue type and reason): LLE buckled, poor foot placement and progression with scissoring Locomotion  Ambulation Ambulation: Yes Ambulation/Gait Assistance: 3: Mod assist;1: +2 Total assist (+2 w/c follow and IV management) Ambulation Distance (Feet): 15 Feet Assistive device: 1 person hand held assist;Other (Comment) (LUE over therapist's shoulder) Ambulation/Gait Assistance Details: Verbal cues for gait pattern;Verbal cues for technique;Verbal cues for sequencing;Tactile cues for placement Ambulation/Gait Assistance Details: assist for LLE progression, stance control d/t maintaining hip/knee flexion, hip IR, plantarflexion, appeared antalgic. Pt limited by communication deficits but appeared to indicate something hurting at L knee. Gait Gait: Yes Gait Pattern: Left flexed knee in stance;Poor foot clearance - right;Narrow base of support;Decreased weight shift to  left;Scissoring;Antalgic;Step-to pattern;Decreased stance time - left;Decreased stride length Gait velocity: decreased Stairs / Additional Locomotion Stairs: No Architect: Yes Wheelchair Assistance: 4: Advertising account executive Details: Verbal cues for sequencing;Tactile cues for sequencing;Tactile cues for placement;Verbal cues for precautions/safety Wheelchair Propulsion: Both upper extremities Wheelchair Parts Management: Needs assistance Distance: 20'  Trunk/Postural Assessment  Cervical Assessment Cervical Assessment: Within Functional Limits Thoracic Assessment Thoracic Assessment: Within Functional Limits Lumbar Assessment Lumbar Assessment: Within Functional Limits Postural Control Postural Control: Deficits on evaluation (delayed righting reactions in sitting)  Balance Balance Balance Assessed: Yes Static Sitting Balance Static Sitting - Balance Support: Right upper extremity supported Static Sitting - Level of Assistance: 5: Stand by assistance Dynamic Sitting Balance Dynamic Sitting - Balance Support: Right upper extremity supported Dynamic Sitting - Level of Assistance: 5: Stand by assistance Sitting balance - Comments: dons socks in crossed leg sitting on edge of bed Static Standing Balance Static Standing - Balance Support: Left upper extremity supported Static Standing - Level of Assistance: 4: Min assist Dynamic Standing Balance Dynamic Standing - Balance Support: Left upper extremity supported Dynamic Standing - Level of Assistance: 3: Mod assist Dynamic Standing - Comments: during stand pivot transfer Extremity Assessment  RUE Assessment RUE Assessment: Within Functional Limits (grossly [facility]  for basic mobility/ADLs) LUE Assessment LUE Assessment: Within Functional Limits (grossly WFL for basic mobility/ADLs) RLE Assessment RLE Assessment: Exceptions to [facility] (generalized weakness; 4/5 grossly in functional tasks) LLE  Assessment LLE Assessment: Exceptions to [facility] (generalized weakness; decreased knee stability with buckling/severe flexion during gait, appears to indicate pain in knee)   See Function Navigator for Current Functional Status.   Refer to Care Plan for Long Term Goals  Recommendations for other services: Neuropsych and Therapeutic Recreation  Pet therapy, Stress management and Outing/community reintegration  Discharge Criteria: Patient will be discharged from PT if patient refuses treatment 3 consecutive times without medical reason, if treatment goals not met, if there is a change in medical status, if patient makes no progress towards goals or if patient is discharged from hospital.  The above assessment, treatment plan, treatment alternatives and goals were discussed and mutually agreed upon: by patient and by family  Luberta Mutter 05/02/2016, 1:00 PM

## 2016-05-02 NOTE — Procedures (Signed)
Tracheostomy Change Note  Patient Details:   Name: Jane Watkins DOB: 08/07/1995 MRN: [id number]    Airway Documentation:     Evaluation  O2 sats: stable throughout Complications: No apparent complications Patient did tolerate procedure well. Bilateral Breath Sounds: Clear     Pt's trach changed to #4 shiley cuffless per MD order and cap placed on trach. Pt is on RA.  Vital signs stable. RN made aware [facility]  Equipment is at bedside.  RT will continue to monitor.      Ronny FlurryMorgan B Atreus Hasz 05/02/2016, 5:07 PM

## 2016-05-02 NOTE — Evaluation (Signed)
Occupational Therapy Assessment and Plan  Patient Details  Name: Jane Watkins MRN: [id number] Date of Birth: [birth year]  OT Diagnosis: abnormal posture, cognitive deficits and muscle weakness (generalized) Rehab Potential: Rehab Potential (ACUTE ONLY): Good ELOS: 3 weeks   Today's date: 05/02/16 Individual treatment time: 1100-1110 and 1540-1636 Individual treatment time calculation: 10 min and 56 min   Problem List:  Patient Active Problem List   Diagnosis Date Noted  . TBI (traumatic brain injury) ([place]) 05/01/2016  . 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 Respiratory distress   . Glasgow coma scale total score 3-8 ([place])   . Pneumonia of both lower lobes due to methicillin resistant Staphylococcus aureus (MRSA) ([place])   . Acute pulmonary edema ([facility])   . Non-traumatic rhabdomyolysis   . Opioid abuse   . Elevated troponin   . Acute respiratory failure with hypoxia ([place])   . Drug overdose   . Elevated liver enzymes   . Encephalopathy acute   . Encephalopathy 04/19/2016  . Opiate overdose 04/19/2016    Past Medical History: No past medical history on file. Past Surgical History:  Past Surgical History:  Procedure Laterality Date  . APPENDECTOMY    . MOUTH SURGERY      Assessment & Plan Clinical Impression: Jane Watkins a 21 y.o.right handed femalewith history of opiate abuse as well as depression and tobacco abuse. Per chart review patient lived alone and was independent prior to admission. Current plan is to discharge to her apartment with her father staying with her and providing assistance or she may discharge to her father and stepmom's home who can provide care. Presented 04/19/2016 after being found next to her boyfriend who was deceased. She was unresponsive. She was brought initially to [facility] combative requiring intubation. Her CK was 15,000 with mild lactic acidosis. Mildly elevated troponin felt to be related to demand ischemia. Cranial CT  scan negative. Follow-up MRI of the brain unremarkable 04/22/2016. UDS positive for opiates. EEG consistent with severe nonspecific diffuse cerebral dysfunction with no seizure activity. Echocardiogram with ejection fraction of 70% no wall motion abnormalities. Maintain on ventilatory support with failed extubation and ultimately with percutaneous tracheostomy placement 04/28/2016. Attempts to place a nasogastric tube was unsuccessful currently on IV fluids for hydration and plan modified barium swallow completed 04/30/2016 and placed on a dysphagia #1 honey thick liquid diet. Marland KitchenMRSA PCR screen positive maintained on contact precautions.UC 04/19/2016 greater than 100,000 coag negative staph completed course of vancomycin 04/29/2016.Patient with mild tachycardia when up with therapies no acute distress normal sinus rhythm and continued to be monitored. Subcutaneous Lovenox for DVT prophylaxis.Physical andOccupational therapy evaluationscompleted with recommendations of physical medicine rehabilitation consult.Patient was admitted for a comprehensive rehabilitation program   Patient currently requires Max A with basic self-care skills secondary to muscle weakness, decreased cardiorespiratoy endurance, decreased coordination, decreased attention, decreased awareness, decreased problem solving, decreased safety awareness and decreased memory and decreased sitting balance, decreased standing balance and decreased postural control.  Prior to hospitalization, patient could complete BADLs with independent .  Patient will benefit from skilled intervention to increase independence with basic self-care skills prior to discharge home with care partner.  Anticipate patient will require 24 hour supervision and no further OT follow recommended.  OT - End of Session Endurance Deficit: Yes Endurance Deficit Description:  (Pt required max encouragement to maintain alertness during tx and to not lie back down in bed) OT  Assessment Rehab Potential (ACUTE ONLY): Good Barriers to Discharge:  (N/A) OT Patient demonstrates impairments  in the following area(s): Balance;Safety;Behavior;Cognition;Endurance;Motor OT Basic ADL's Functional Problem(s): Bathing;Dressing;Toileting OT Advanced ADL's Functional Problem(s): Simple Meal Preparation OT Transfers Functional Problem(s): Toilet;Tub/Shower OT Additional Impairment(s): None OT Plan OT Intensity: Minimum of 1-2 x/day, 45 to 90 minutes OT Frequency: 5 out of 7 days OT Duration/Estimated Length of Stay: 3 weeks OT Treatment/Interventions: Discharge planning;Self Care/advanced ADL retraining;Therapeutic Activities;UE/LE Coordination activities;Cognitive remediation/compensation;Functional mobility training;Patient/family education;Therapeutic Exercise;Neuromuscular re-education;Psychosocial support;UE/LE Strength taining/ROM OT Self Feeding Anticipated Outcome(s): N/A OT Basic Self-Care Anticipated Outcome(s): Supervision OT Toileting Anticipated Outcome(s): Supervision OT Bathroom Transfers Anticipated Outcome(s): Supervision OT Recommendation Recommendations for Other Services: Neuropsych consult Patient destination: Home Follow Up Recommendations: 24 hour supervision/assistance Equipment Recommended: To be determined   Skilled Therapeutic Intervention Pt seen for OT eval with mother Jane Watkins present. Pt was lying in bed asleep, unable to be aroused when cold wash cloth placed on face or open eyes when asked to do so by therapist. RN made aware. Pt unable to be fully aroused to participate in tx, plans made to return to complete eval.   2nd Session 1:1 tx (56 min) On 3rd attempt to see pt, she was on [facility] with SLP, nurse tech, and mother present. Pt able to follow 1 step commands and assist with hygiene in standing. Therapist facilitated sit<stand with Max A for lifting/lowering assist. Pt exhibiting lack of awareness regarding posture/balance, as well as  thoroughness with hygiene. +2 assist for finishing hygiene and for clothing mgt. Pt required cues for termination of BADL tasks she initiated due to attention deficits. Stand pivot transfer completed back to EOB with Max A and left knee buckling. Teds donned by therapist at EOB, pt closing eyes and attempting to lie back down in bed due to fatigue. IV leaking during session with RN/nurse tech providing care while pt at EOB. Tactile cues provided to increase alertness.Afterwards she was returned to bed, pointed to the correct button to press on call bell if she needed anything. She was left with all needs within reach and bed alarm activated at time of departure. Pt oriented to self, place, month and year. during tx. 02 sats <98% during session.   OT Evaluation Precautions/Restrictions  Precautions Precautions: Fall;Other (comment) Precaution Comments: Trach Restrictions Weight Bearing Restrictions: No General Chart Reviewed: Yes Family/Caregiver Present: Yes (Mother Jane Watkins) Vital Signs Therapy Vitals Pulse Rate: 85 Resp: 18 Oxygen Therapy SpO2: 98 % O2 Device: Not Delivered Pain Pt shakes her head "no" when asked if she had pain   Home Living/Prior Functioning Home Living Family/patient expects to be discharged to:: Private residence Living Arrangements: Alone Available Help at Discharge: Family, Available 24 hours/day Type of Home: Apartment Home Access: Stairs to enter [employer] of Steps: 1 step at entry Home Layout: One level Bathroom Shower/Tub: Public librarian, Multimedia programmer: Standard Additional Comments:  (Per mother, pt will most likely d/c to her own apartment with mother and father (who are divorced) alternating 24/7 supervision)  Lives With: Alone IADL History Homemaking Responsibilities: Yes Meal Prep Responsibility: Primary Laundry Responsibility: Primary Cleaning Responsibility: Primary Bill Paying/Finance Responsibility:  Primary Shopping Responsibility: Primary Education: High school Occupation: Full time employment Type of Occupation: Worked FT as Programme researcher, broadcasting/film/video at a Art [employer] Leisure and Hobbies: Taking care of her dog Prior Function Level of Independence: Independent with basic ADLs, Independent with homemaking with ambulation, Independent with gait, Independent with transfers Driving: Yes Vocation: Full time employment ADL ADL ADL Comments: Please see functional navigator for ADL status Vision/Perception  Vision- History Baseline Vision/History: No  visual deficits Vision- Assessment Vision Assessment?: No apparent visual deficits Perception Comments:  (WFL during self care tasks)  Cognition Overall Cognitive Status: Impaired/Different from baseline Arousal/Alertness: Lethargic Orientation Level: Person;Place Year: 2018 Month: March Day of Week: Incorrect Memory: Impaired Memory Impairment: Decreased recall of new information Immediate Memory Recall:  (N/A due to pt nonverbal) Attention: Sustained;Focused Focused Attention: Impaired Focused Attention Impairment: Verbal basic;Functional basic Sustained Attention: Impaired Sustained Attention Impairment: Verbal basic;Functional basic Awareness: Impaired Problem Solving: Impaired Behaviors: Impulsive;Poor frustration tolerance;Restless Safety/Judgment: Impaired Sensation Sensation Light Touch: Appears Intact Stereognosis: Not tested Hot/Cold: Not tested Proprioception: Not tested Additional Comments:  (assessment completion limited due to lethargy  and cognitive deficits) Coordination Gross Motor Movements are Fluid and Coordinated: No Fine Motor Movements are Fluid and Coordinated: No Coordination and Movement Description: Slight tremorous activity with bilateral UEs when donning socks and using hands functionally Motor  Motor Motor: Abnormal postural alignment and control;Motor impersistence Mobility  Transfers Sit to Stand: 2: Max  assist Sit to Stand Details: Verbal cues for precautions/safety;Verbal cues for technique;Tactile cues for weight shifting Stand to Sit: 2: Max assist Stand to Sit Details (indicate cue type and reason): Verbal cues for technique;Tactile cues for weight shifting;Verbal cues for precautions/safety  Trunk/Postural Assessment  Cervical Assessment Cervical Assessment: Within Functional Limits Thoracic Assessment Thoracic Assessment: Within Functional Limits Lumbar Assessment Lumbar Assessment: Within Functional Limits Postural Control Postural Control: Deficits on evaluation (postural deficits in sitting/standing)  Balance Balance Balance Assessed: Yes Dynamic Sitting Balance Dynamic Sitting - Balance Support: No upper extremity supported Dynamic Sitting - Level of Assistance: 5: Stand by assistance (while donning socks) Static Standing Balance Static Standing - Balance Support: During functional activity Static Standing - Level of Assistance: 3: Mod assist Dynamic Standing Balance Dynamic Standing - Balance Support: No upper extremity supported Dynamic Standing - Level of Assistance: 2: Max assist Dynamic Standing - Balance Activities:  (clothing mgt post toileting) Extremity/Trunk Assessment RUE Assessment RUE Assessment: Exceptions to [facility] (3/5 proximal to distal) LUE Assessment LUE Assessment: Exceptions to [facility] (3/5 proximal to distal)   See Function Navigator for Current Functional Status.   Refer to Care Plan for Long Term Goals  Recommendations for other services: Neuropsych and Therapeutic Recreation  Pet therapy   Discharge Criteria: Patient will be discharged from OT if patient refuses treatment 3 consecutive times without medical reason, if treatment goals not met, if there is a change in medical status, if patient makes no progress towards goals or if patient is discharged from hospital.  The above assessment, treatment plan, treatment alternatives and goals were  discussed and mutually agreed upon: by patient and by family  Skeet Simmer 05/02/2016, 8:24 PM

## 2016-05-02 NOTE — Progress Notes (Signed)
Physical Therapy Session Note  Patient Details  Name: Jane Watkins MRN: [id number] Date of Birth: 02/18/1995  Today's Date: 05/02/2016 PT Individual Time: 1300-1315 PT Individual Time Calculation (min): 15 min   Short Term Goals: Week 1:  PT Short Term Goal 1 (Week 1): Pt will perform bed mobility modI PT Short Term Goal 2 (Week 1): Pt will perform transfers minA PT Short Term Goal 3 (Week 1): Pt will ambulate x30' with modA PT Short Term Goal 4 (Week 1): Pt will initiate stair training PT Short Term Goal 5 (Week 1): Pt will attend to task >5 min at a time with min cues for redirection  Skilled Therapeutic Interventions/Progress Updates:  Pt found in bed, noted to be very lethargic. Mother present and reports that the pt has been sleeping heavily. Pt able to sit EOB with max assist X1. In sitting pt unable to hold balance and leaning heavily forward. Pt noted to scratch her nose and move LEs when getting in/out of bed but not assisting during session. At this time the pt is not appropriate for PT session due to lethargy (nursing aware). Pt in bed with bed alarm on and mother present, call light in reach.   Therapy Documentation Precautions:  Restrictions Weight Bearing Restrictions: No General: PT Amount of Missed Time (min): 45 Minutes PT Missed Treatment Reason: Other (Comment) lethargy/level of arousal.  Pain:  No observed pain, monitored during session.   See Function Navigator for Current Functional Status.   Therapy/Group: Individual Therapy  Delton SeeBenjamin Brant Peets, PT 05/02/2016, 1:58 PM

## 2016-05-03 ENCOUNTER — Inpatient Hospital Stay (HOSPITAL_COMMUNITY): Payer: 59 | Admitting: Speech Pathology

## 2016-05-03 ENCOUNTER — Inpatient Hospital Stay (HOSPITAL_COMMUNITY): Payer: 59 | Admitting: Occupational Therapy

## 2016-05-03 ENCOUNTER — Inpatient Hospital Stay (HOSPITAL_COMMUNITY): Payer: 59

## 2016-05-03 DIAGNOSIS — Z930 Tracheostomy status: Secondary | ICD-10-CM

## 2016-05-03 MED ORDER — ACETAMINOPHEN 160 MG/5ML PO SOLN
500.0000 mg | Freq: Four times a day (QID) | ORAL | Status: DC | PRN
  Administered 2016-05-03 – 2016-05-08 (×5): 500 mg via ORAL
  Filled 2016-05-03 (×7): qty 20.3

## 2016-05-03 NOTE — Progress Notes (Signed)
Speech Language Pathology Daily Session Note  Patient Details  Name: Jane Watkins MRN: [id number] Date of Birth: 05/02/1995  Today's Date: 05/03/2016 SLP Individual Time: [id number] SLP Individual Time Calculation (min): 45 min  Short Term Goals: Week 1: SLP Short Term Goal 1 (Week 1): Pt will consume puree with honey thick with Max A multimodal cues for compensatory swallow strategies.  SLP Short Term Goal 2 (Week 1): Pt will consume trials of ice chips without overt s/s of aspiration to demonstrate readiness for repeat instrumental study.  SLP Short Term Goal 3 (Week 1): Pt will tolerate PMSV placement for 1 to 2 breath cycles without respiratory decline.  SLP Short Term Goal 4 (Week 1): Pt will demonstrate focused attention for 2 minutes with supervision cues.  SLP Short Term Goal 5 (Week 1): Pt will use multimodal communication to communicate basic wants and needs with ~ 50% accuracy and Min A cues.   Skilled Therapeutic Interventions: Skilled treatment session focused on cognition goals. SLP facilitated session by providing Total A to initiate basic familiar task (brushing hair in front of mirror). As session progressed and her mother left the room, pt's attention increased and she was able to sustain attention for ~ 3 minute intervals (4 times at the end of the session). Pt's trach was changed to #4 cuffless and is currently capped. Pt able to demonstrate upper airway patency and blow air on kleenex. Pt with decreased speech intelligibility d/t significantly decreased vocal intensity and possible hoarseness - almost aphonic voicing. Despite Total A multimodal cues, pt unable to increase vocal intensity or speech intelligibility beyond 25%. Pt attempted writing but was not legible. Max cues given with only mild increase in effectiveness. Pt able to answer basic biographical questions accurately with gestures and mouthing. Pt was left upright in wheelchair, safety belt donned, telesitter in place  with visitor entering room. Continue per current plan of care.      Function:   Cognition Comprehension Comprehension assist level: Understands basic 90% of the time/cues < 10% of the time  Expression   Expression assist level: Expresses basic 25 - 49% of the time/requires cueing 50 - 75% of the time. Uses single words/gestures.  Social Interaction Social Interaction assist level: Interacts appropriately 50 - 74% of the time - May be physically or verbally inappropriate.  Problem Solving Problem solving assist level: Solves basic 25 - 49% of the time - needs direction more than half the time to initiate, plan or complete simple activities  Memory Memory assist level: Recognizes or recalls 25 - 49% of the time/requires cueing 50 - 75% of the time    Pain    Therapy/Group: Individual Therapy  Charletha Dalpe B. Dreama Saaverton, M.S., CCC-SLP Speech-Language Pathologist  Kimanh Templeman 05/03/2016, 11:36 AM

## 2016-05-03 NOTE — Progress Notes (Signed)
Occupational Therapy Session Note  Patient Details  Name: Jane Watkins MRN: [id number] Date of Birth: 07/17/1995  Today's Date: 05/03/2016 OT Individual Time: [id number] OT Individual Time Calculation (min): 34 min    Short Term Goals: Week 1:  OT Short Term Goal 1 (Week 1): Pt will intiate and terminate 1/3 components of toileting  OT Short Term Goal 2 (Week 1): Pt will maintain sustained attention to complete bathing/dressing ADL during 1 OT session OT Short Term Goal 3 (Week 1): Pt will complete toilet transfes with Mod A and LRAD OT Short Term Goal 4 (Week 1): Pt will complete LB dressing at EOB with Mod A sit<stand  Skilled Therapeutic Interventions/Progress Updates:    Pt seated in w/c in room upon arrival. Pt initially wanting to lie down in bed upon and sleep instead of participate in tx. Pt decline opportunity to brush/detangle hair, however pt family/friends arrive and pt agreeable to playing board game. Focus of session on attention, handwriting, and command following. OT explains rules of game, and Pt writes names of participants on sheet of paper and scores with encouragement to participate. With MAX VC pt able to identify appropriate moves to make in game matching colors/shapes, but frequently rest head on table and closes eyes. Pt able to take turns appropriately. Exited session with pt in dayroom with QRB donned and resting head on table with mother present and nursing/front desk staff aware she is in dayroom as they are relocating her belongings to another room.  Therapy Documentation Precautions:  Precautions Precautions: Fall, Other (comment) Precaution Comments: Trach Restrictions Weight Bearing Restrictions: No Pain:   no c/o pain ADL: ADL ADL Comments: Please see functional navigator for ADL status Exercises:   Other Treatments:    See Function Navigator for Current Functional Status.   Therapy/Group: Individual Therapy  Shon HaleStephanie M Zelie Asbill 05/03/2016,  6:07 PM

## 2016-05-03 NOTE — Progress Notes (Signed)
[place] PHYSICAL MEDICINE & REHABILITATION     PROGRESS NOTE    Subjective/Complaints: Lying in bed. States she was able to sleep. Trach plugged without issues  ROS: Limited due cognitive/behavioral   Objective: Vital Signs: Blood pressure 128/68, pulse 89, temperature 98 F (36.7 C), temperature source Oral, resp. rate 16, height 5\' 2"  (1.575 m), weight 52.2 kg (115 lb), SpO2 99 %. No results found.  Recent Labs  05/02/16 0605 05/02/16 0743  WBC 7.2 11.5*  HGB 8.8* 12.9  HCT 26.3* 37.5  PLT 385 590*    Recent Labs  05/01/16 0339 05/02/16 0743  NA 141 139  K 3.8 4.1  CL 107 105  GLUCOSE 102* 88  BUN 11 14  CREATININE 0.39* 0.44  CALCIUM 9.6 9.7   CBG (last 3)   Recent Labs  04/30/16 2308 05/01/16 0809 05/01/16 1154  GLUCAP 90 79 92    Wt Readings from Last 3 Encounters:  05/01/16 52.2 kg (115 lb)  05/01/16 51.6 kg (113 lb 12.1 oz)  02/02/16 52.2 kg (115 lb)    Physical Exam:  HENT.  Head. Normocephalic Eyes. Pupils round and reactive to light Neck. Tracheostomy tube #4 capped.  Cardiovascular. RRR   Respiratory. CTA B. No distress. Phonates with trach plugged GI. Soft. Bowel sounds normal. She exhibits no distention Skin. Warm and dry Neurological. Makes eye contact. No CN abnl. Repeats words from conversations/slow to process. Limited insight and awareness.  Moves all 4's with at least 4- to 4/5 strength. Senses pain in all 4's. No resting tone or clonus seen. Psych: very flat     Assessment/Plan: 1. Functional and cognitive deficits secondary to anoxic brain injury which require 3+ hours per day of interdisciplinary therapy in a comprehensive inpatient rehab setting. Physiatrist is providing close team supervision and 24 hour management of active medical problems listed below. Physiatrist and rehab team continue to assess barriers to discharge/monitor patient progress toward functional and medical goals.  Function:  Bathing Bathing  position Bathing activity did not occur: Refused    Bathing parts      Bathing assist        Upper Body Dressing/Undressing Upper body dressing   What is the patient wearing?: Pull over shirt/dress     Pull over shirt/dress - Perfomed by patient: Thread/unthread right sleeve, Thread/unthread left sleeve, Put head through opening, Pull shirt over trunk          Upper body assist Assist Level: Supervision or verbal cues      Lower Body Dressing/Undressing Lower body dressing   What is the patient wearing?: Non-skid slipper socks, Ted Hose     Pants- Performed by patient: Thread/unthread right pants leg Pants- Performed by helper: Thread/unthread left pants leg, Pull pants up/down Non-skid slipper socks- Performed by patient: Don/doff right sock, Don/doff left sock                 TED Hose - Performed by helper: Don/doff right TED hose, Don/doff left TED hose  Lower body assist Assist for lower body dressing: Touching or steadying assistance (Pt > 75%)      Toileting Toileting Toileting activity did not occur: No continent bowel/bladder event   Toileting steps completed by helper: Performs perineal hygiene, Adjust clothing after toileting, Adjust clothing prior to toileting    Toileting assist Assist level: Two helpers   Transfers Chair/bed transfer Chair/bed transfer activity did not occur: N/A Chair/bed transfer method: Stand pivot Chair/bed transfer assist level: Moderate assist (Pt  50 - 74%/lift or lower) Chair/bed transfer assistive device: Armrests     Locomotion Ambulation     Max distance: 15' Assist level: 2 helpers   Wheelchair   Type: Manual Max wheelchair distance: 50 Assist Level: Touching or steadying assistance (Pt > 75%)  Cognition Comprehension Comprehension assist level: Understands basic 90% of the time/cues < 10% of the time  Expression Expression assist level: Expresses basic 25 - 49% of the time/requires cueing 50 - 75% of the time.  Uses single words/gestures.  Social Interaction Social Interaction assist level: Interacts appropriately 50 - 74% of the time - May be physically or verbally inappropriate.  Problem Solving Problem solving assist level: Solves basic less than 25% of the time - needs direction nearly all the time or does not effectively solve problems and may need a restraint for safety  Memory Memory assist level: Recognizes or recalls less than 25% of the time/requires cueing greater than 75% of the time   Medical Problem List and Plan: 1.  Hypoxic encephalopathy cognitive deficits, balance disorder secondary to drug overdose  -continue CIR therapies 2.  DVT Prophylaxis/Anticoagulation: Subcutaneous Lovenox. Monitor platelet counts and any signs of bleeding. Dopplers pending 3. Pain Management: Tylenol as needed 4. Mood: Klonopin 2 mg twice a day, Prozac 30 mg daily, Seroquel 100 mg twice a day 5. Neuropsych: This patient is not capable of making decisions on her own behalf. 6. Skin/Wound Care: Routine skin checks 7. Fluids/Electrolytes/Nutrition: Routine I&O. Push po---eating little so far  -I personally reviewed the patient's labs today.  8. Tracheostomy 04/28/2016 per Dr. Tyson Alias.   -trach downsized so that we could cap.   -if no issues over weekend, decannulate Monday. So far so good 9. Dysphagia. Dysphagia #1 honey thick liquid diet.   -encourage fluids. Electrolytes and renal functional normal upon review of labs  -continue IVF HS  -initiation an issue 10. MRSA PCR screen positive. Contact precautions 11. Polysubstance tobacco and alcohol abuse. Counseling 12.Tachycardia. Patient normal sinus rhythm. No acute distress. Monitor with increased mobility 13.Leukocytosis.WBC up to 11.5k.  Patient remains afebrile. No clinical signs of infection   LOS (Days) 2 A FACE TO FACE EVALUATION WAS PERFORMED  Ranelle Oyster, MD 05/03/2016 9:29 AM

## 2016-05-03 NOTE — Progress Notes (Signed)
Occupational Therapy Session Note  Patient Details  Name: Jane Watkins MRN: [id number] Date of Birth: 01/12/1996  Today's Date: 05/03/2016 OT Individual Time: [id number] and 1003-1200 OT Individual Time Calculation (min): 68 min and 57 min   Short Term Goals: Week 1:  OT Short Term Goal 1 (Week 1): Pt will intiate and terminate 1/3 components of toileting  OT Short Term Goal 2 (Week 1): Pt will maintain sustained attention to complete bathing/dressing ADL during 1 OT session OT Short Term Goal 3 (Week 1): Pt will complete toilet transfes with Mod A and LRAD OT Short Term Goal 4 (Week 1): Pt will complete LB dressing at EOB with Mod A sit<stand  Skilled Therapeutic Interventions/Progress Updates: Skilled OT session completed with focus on adaptive bathing/dressing, safety awareness, endurance, and attention. Pt was lying in bed with family present at time of arrival, alert and verbalizing desire (with capped trach) to get up with therapy. Stand pivot from bed<w/c completed with Mod A and left knee blocked. Bathing/dressing and hair washing completed w/c level at sink. Mod A for sit<stands, pt able to follow 2 step directions and engage in conversation with therapist. We talked about her substance abuse history. She verbalized desire to "leave that stuff" when she goes home. OT provided emotional support and encouragement. Mod cues provided for safety with sit<stands and termination of tasks. Pt required increased assist at times due to poor frustration tolerance. After therapist washed her hair, had pt try to detangle some of her hair with comb for attention. Pt requested to remain at sink to continue with combing hair. She told OT she would call for nurse tech assist when ready to be returned to bedside. Safety belt was donned, call bell attached to w/c and nurse tech made aware of pts position at time of departure.   2nd Session 1:1 tx (57 min) Skilled OT session completed with focus on endurance  and attention. Pt was sitting in w/c with head on table at time of arrival. Per family, she had become very lethargic after session this AM. Pt had stable HR/02, BP 101/64. She reported nausea. RN notified about physical status. Teds hose were donned with Total A, had pt don her socks again with twice the time it took this morning and max cues for terminating task. She was then taken to dayroom, had her practice buttoning due to coordination deficits observed when donning button up shirt earlier. Max cues for attention to task. She frequently placed her head on the table and closed her eyes. Afterwards pt was returned to room and left with safety belt donned, family, and all needs within reach. Pt agreeable to stay up for lunch before laying back down with nurse tech made aware.      Therapy Documentation Precautions:  Precautions Precautions: Fall, Other (comment) Precaution Comments: Trach Restrictions Weight Bearing Restrictions: No General:   Vital Signs: Therapy Vitals Resp: 16 Patient Position (if appropriate): Sitting Oxygen Therapy O2 Device: Not Delivered Pain:   ADL: ADL ADL Comments: Please see functional navigator for ADL status     See Function Navigator for Current Functional Status.   Therapy/Group: Individual Therapy  Maricel Swartzendruber A Rema Lievanos 05/03/2016, 12:19 PM

## 2016-05-04 ENCOUNTER — Inpatient Hospital Stay (HOSPITAL_COMMUNITY): Payer: 59 | Admitting: Physical Therapy

## 2016-05-04 NOTE — Progress Notes (Signed)
Physical Therapy Session Note  Patient Details  Name: Jane Watkins MRN: [id number] Date of Birth: 06/07/1995  Today's Date: 05/04/2016 PT Individual Time: 0800-0900 PT Individual Time Calculation (min): 60 min   Short Term Goals: Week 1:  PT Short Term Goal 1 (Week 1): Pt will perform bed mobility modI PT Short Term Goal 2 (Week 1): Pt will perform transfers minA PT Short Term Goal 3 (Week 1): Pt will ambulate x30' with modA PT Short Term Goal 4 (Week 1): Pt will initiate stair training PT Short Term Goal 5 (Week 1): Pt will attend to task >5 min at a time with min cues for redirection  Skilled Therapeutic Interventions/Progress Updates: Pt received seated in w/c eating breakfast with family present; denies pain and agreeable to treatment. Pt dons socks/shoes with setupA and increased time with min cues. W/c propulsion with BUE x100' for UE strengthening and coordination, problem solving. Min cues for hand placement for turning w/c around corners. Gait 2x30' with RW and min guard; cued pt in normalized gait pattern with heel-toe step LLE. Pt reporting pain in L knee; observed minimal swelling inferior to patella, not tender to palpation. Valgus/varus stress test (MCL/LCL), anterior/posterior drawer (ACL/PCL), mcmurrays test (meniscus) all negative and not provocative for pain. Educated pt on benefit of weight bearing Performed nustep x5 min with BUE/BLE on level 4; pt reports "it feels really good" to ride bike. Stairs ascent/descent 8 four-inch stairs, 4 six-inch stairs with B handrails; min guard overall however when turning at bottom of 6" stairs pt LLE buckled and required totalA to recover and prevent fall. Seated rest break to allow pt to rest. Performed car transfer with min guard to sedan height car. Returned to room totalA at end of session for energy conservation; remained seated in w/c with SO present and all needs in reach. Updated safety plan and alerted NT to update for pt to ambulate  in/out of bathroom and watch for LLE buckling.      Therapy Documentation Precautions:  Precautions Precautions: Fall, Other (comment) Precaution Comments: Trach Restrictions Weight Bearing Restrictions: No   See Function Navigator for Current Functional Status.   Therapy/Group: Individual Therapy  Vista Lawmanlizabeth J Tygielski 05/04/2016, 9:01 AM

## 2016-05-04 NOTE — Progress Notes (Signed)
[place] PHYSICAL MEDICINE & REHABILITATION     PROGRESS NOTE    Subjective/Complaints: In bed. Family at bedside. No new problems. Denies pain. Admits that left side still weak  ROS: pt denies nausea, vomiting, diarrhea, cough, shortness of breath or chest pain   Objective: Vital Signs: Blood pressure (!) 98/58, pulse 92, temperature 98.7 F (37.1 C), temperature source Oral, resp. rate 18, height 5\' 2"  (1.575 m), weight 52.2 kg (115 lb), SpO2 98 %. No results found.  Recent Labs  05/02/16 0605 05/02/16 0743  WBC 7.2 11.5*  HGB 8.8* 12.9  HCT 26.3* 37.5  PLT 385 590*    Recent Labs  05/02/16 0743  NA 139  K 4.1  CL 105  GLUCOSE 88  BUN 14  CREATININE 0.44  CALCIUM 9.7   CBG (last 3)   Recent Labs  05/01/16 1154  GLUCAP 92    Wt Readings from Last 3 Encounters:  05/01/16 52.2 kg (115 lb)  05/01/16 51.6 kg (113 lb 12.1 oz)  02/02/16 52.2 kg (115 lb)    Physical Exam:  HENT.  Head. Normocephalic Eyes. Pupils round and reactive to light Neck. Tracheostomy tube #4 capped. Voice improving Cardiovascular.  RRR   Respiratory. CTA B GI. Soft. Bowel sounds normal. She exhibits no distention Skin. Warm and dry Neurological. Makes eye contact. No CN abnl. Repeats words from conversations/slow to process. Limited insight and awareness.  Moves all 4's with at least 4- to 4/5 strength. Senses pain in all 4's. No resting tone or clonus seen. Psych: very flat     Assessment/Plan: 1. Functional and cognitive deficits secondary to anoxic brain injury which require 3+ hours per day of interdisciplinary therapy in a comprehensive inpatient rehab setting. Physiatrist is providing close team supervision and 24 hour management of active medical problems listed below. Physiatrist and rehab team continue to assess barriers to discharge/monitor patient progress toward functional and medical goals.  Function:  Bathing Bathing position Bathing activity did not occur:  Refused Position: Wheelchair/chair at sink  Bathing parts Body parts bathed by patient: Right arm, Left arm, Chest, Abdomen, Right upper leg, Left upper leg, Right lower leg, Left lower leg Body parts bathed by helper: Back  Bathing assist Assist Level: Touching or steadying assistance(Pt > 75%)      Upper Body Dressing/Undressing Upper body dressing   What is the patient wearing?: Button up shirt     Pull over shirt/dress - Perfomed by patient: Thread/unthread right sleeve, Thread/unthread left sleeve, Put head through opening, Pull shirt over trunk   Button up shirt - Perfomed by patient: Thread/unthread right sleeve, Thread/unthread left sleeve, Pull shirt around back Button up shirt - Perfomed by helper: Button/unbutton shirt    Upper body assist Assist Level: Touching or steadying assistance(Pt > 75%)      Lower Body Dressing/Undressing Lower body dressing   What is the patient wearing?: Non-skid slipper socks, Ted Hose, Underwear, Pants Underwear - Performed by patient: Thread/unthread right underwear leg, Thread/unthread left underwear leg Underwear - Performed by helper: Pull underwear up/down Pants- Performed by patient: Thread/unthread right pants leg Pants- Performed by helper: Thread/unthread left pants leg, Pull pants up/down Non-skid slipper socks- Performed by patient: Don/doff right sock, Don/doff left sock                 TED Hose - Performed by helper: Don/doff right TED hose, Don/doff left TED hose  Lower body assist Assist for lower body dressing: Touching or steadying assistance (Pt >  75%)      Toileting Toileting Toileting activity did not occur: No continent bowel/bladder event   Toileting steps completed by helper: Performs perineal hygiene, Adjust clothing after toileting, Adjust clothing prior to toileting    Toileting assist Assist level: Two helpers   Transfers Chair/bed transfer Chair/bed transfer activity did not occur: N/A Chair/bed  transfer method: Stand pivot Chair/bed transfer assist level: Touching or steadying assistance (Pt > 75%) Chair/bed transfer assistive device: Armrests     Locomotion Ambulation     Max distance: 30 Assist level: Touching or steadying assistance (Pt > 75%)   Wheelchair   Type: Manual Max wheelchair distance: 100 Assist Level: Touching or steadying assistance (Pt > 75%)  Cognition Comprehension Comprehension assist level: Understands basic 90% of the time/cues < 10% of the time  Expression Expression assist level: Expresses basic 25 - 49% of the time/requires cueing 50 - 75% of the time. Uses single words/gestures.  Social Interaction Social Interaction assist level: Interacts appropriately 50 - 74% of the time - May be physically or verbally inappropriate.  Problem Solving Problem solving assist level: Solves basic 25 - 49% of the time - needs direction more than half the time to initiate, plan or complete simple activities  Memory Memory assist level: Recognizes or recalls 25 - 49% of the time/requires cueing 50 - 75% of the time   Medical Problem List and Plan: 1.  Hypoxic encephalopathy cognitive deficits, balance disorder secondary to drug overdose  -continue CIR therapies 2.  DVT Prophylaxis/Anticoagulation: Subcutaneous Lovenox. Monitor platelet counts and any signs of bleeding. Dopplers pending 3. Pain Management: Tylenol as needed 4. Mood: Klonopin 2 mg twice a day, Prozac 30 mg daily, Seroquel 100 mg twice a day 5. Neuropsych: This patient is not capable of making decisions on her own behalf. 6. Skin/Wound Care: Routine skin checks 7. Fluids/Electrolytes/Nutrition: Routine I&O. Push po---eating little so far but doesn't like diet  -   8. Tracheostomy 04/28/2016 per Dr. Tyson Alias.   -trach downsized so that we could cap.   -if no issues over weekend, decannulate tomorrow. Doing well so far. 9. Dysphagia. Dysphagia #1 honey thick liquid diet.   -encourage fluids.  Electrolytes and renal functional normal upon review of labs  -continue IVF HS while on this diet    10. MRSA PCR screen positive. Contact precautions 11. Polysubstance tobacco and alcohol abuse. Counseling 12.Tachycardia. Patient normal sinus rhythm. No acute distress. Monitor with increased mobility 13.Leukocytosis.WBC up to 11.5k.  Patient remains afebrile. No clinical signs of infection  -recheck this week   LOS (Days) 3 A FACE TO FACE EVALUATION WAS PERFORMED  Ranelle Oyster, MD 05/04/2016 9:32 AM

## 2016-05-05 ENCOUNTER — Inpatient Hospital Stay (HOSPITAL_COMMUNITY): Payer: 59

## 2016-05-05 ENCOUNTER — Inpatient Hospital Stay (HOSPITAL_COMMUNITY): Payer: 59 | Admitting: Physical Therapy

## 2016-05-05 NOTE — Progress Notes (Signed)
RT remover #6 SH per doctor orders at 1450. Pt's SATS 936% rr 17, HR 139. Pt was able to talk and was stable. RT will conitinue to monitor

## 2016-05-05 NOTE — Progress Notes (Signed)
Speech Language Pathology Daily Session Note  Patient Details  Name: Jane Watkins MRN: [id number] Date of Birth: 03/26/1995  Today's Date: 05/05/2016 SLP Individual Time: [id number] SLP Individual Time Calculation (min): 45 min  Short Term Goals: Week 1: SLP Short Term Goal 1 (Week 1): Pt will consume puree with honey thick with Max A multimodal cues for compensatory swallow strategies.  SLP Short Term Goal 2 (Week 1): Pt will consume trials of ice chips without overt s/s of aspiration to demonstrate readiness for repeat instrumental study.  SLP Short Term Goal 3 (Week 1): Pt will tolerate PMSV placement for 1 to 2 breath cycles without respiratory decline.  SLP Short Term Goal 4 (Week 1): Pt will demonstrate focused attention for 2 minutes with supervision cues.  SLP Short Term Goal 5 (Week 1): Pt will use multimodal communication to communicate basic wants and needs with ~ 50% accuracy and Min A cues.   Skilled Therapeutic Interventions: Skilled treatment session focused on cognition and dysphagia goals. Pt continue with #4 cuffless trach, capped. SLP facilitated session by introducing novel semi-complex card game. Pt required Min A faded to supervision for completion of game. Pt was Mod I for selective attention for ~30 minutes in moderately distracting environment. Pt able to express complex ideas with >90% intelligibility at the simple conversation level, decreased vocal intensity and mild hoarseness noted. Pt consumed ice chips and 8 oz thin water via cup with 1 throat clear and no vocal changes. Recommend implementing water protocol with Modified Barium Swallow Study planned. Pt returned to room, left upright in wheelchair with family present. Continue per current plan of care.      Function:  Eating Eating   Modified Consistency Diet: No Eating Assist Level: No help, No cues           Cognition Comprehension Comprehension assist level: Understands complex 90% of the time/cues  10% of the time  Expression Expression assistive device: Talk trach valve Expression assist level: Expresses complex 90% of the time/cues < 10% of the time  Social Interaction Social Interaction assist level: Interacts appropriately with others with medication or extra time (anti-anxiety, antidepressant).  Problem Solving Problem solving assist level: Solves complex 90% of the time/cues < 10% of the time  Memory Memory assist level: Recognizes or recalls 90% of the time/requires cueing < 10% of the time    Pain    Therapy/Group: Individual Therapy   Jackqueline Aquilar B. Dreama Saaverton, M.S., CCC-SLP Speech-Language Pathologist   Kaiyan Luczak 05/05/2016, 9:55 AM

## 2016-05-05 NOTE — IPOC Note (Addendum)
Overall Plan of Care [facility](IPOC) Patient Details Name: Jane Watkins MRN: [id number] DOB: 01/29/1996  Admitting Diagnosis: Acutw Encephalopagthy After Drug Overdose  Hospital Problems: Principal Problem:   Hypoxic-ischemic encephalopathy Active Problems:   Opiate overdose     Functional Problem List: Nursing Bladder, Bowel, Behavior, Endurance, Nutrition, Safety  PT Balance, Behavior, Endurance, Motor, Perception, Safety  OT Balance, Safety, Behavior, Cognition, Endurance, Motor  SLP Cognition, Edema, Endurance, Linguistic, Nutrition, Perception, Safety  TR         Basic ADL's: OT Bathing, Dressing, Toileting     Advanced  ADL's: OT Simple Meal Preparation     Transfers: PT Bed Mobility, Bed to Chair, Car, Occupational psychologisturniture  OT Toilet, Research scientist (life sciences)Tub/Shower     Locomotion: PT Ambulation, Stairs, Psychologist, prison and probation servicesWheelchair Mobility     Additional Impairments: OT None  SLP Swallowing, Communication comprehension, expression Social Interaction, Problem Solving, Memory, Attention, Awareness  TR      Anticipated Outcomes Item Anticipated Outcome  Self Feeding N/A  Swallowing  Mod A   Basic self-care  Supervision  Toileting  Supervision   Bathroom Transfers Supervision  Bowel/Bladder  To be continent of bowel and bladder  Transfers  S  Locomotion  S with LRAD ambulatory  Communication  Mod A  Cognition  Mod A  Pain  less than 2  Safety/Judgment  remain fall free   Therapy Plan: PT Intensity: Minimum of 1-2 x/day ,45 to 90 minutes PT Frequency: 5 out of 7 days PT Duration Estimated Length of Stay: 21-24 days OT Intensity: Minimum of 1-2 x/day, 45 to 90 minutes OT Frequency: 5 out of 7 days OT Duration/Estimated Length of Stay: 3 weeks SLP Intensity: Minumum of 1-2 x/day, 30 to 90 minutes SLP Frequency: 3 to 5 out of 7 days SLP Duration/Estimated Length of Stay: 3 weeks       Team Interventions: Nursing Interventions Patient/Family Education, Bladder Management, Bowel Management,  Cognitive Remediation/Compensation, Discharge Planning, Psychosocial Support  PT interventions Ambulation/gait training, Warden/rangerBalance/vestibular training, Cognitive remediation/compensation, Community reintegration, Development worker, international aidunctional electrical stimulation, Patient/family education, Museum/gallery curatortair training, UE/LE Coordination activities, UE/LE Strength taining/ROM, Splinting/orthotics, Pain management, DME/adaptive equipment instruction, Neuromuscular re-education, Disease management/prevention, Discharge planning, Functional mobility training, Psychosocial support, Therapeutic Activities, Visual/perceptual remediation/compensation, Wheelchair propulsion/positioning, Therapeutic Exercise, Skin care/wound management  OT Interventions Discharge planning, Self Care/advanced ADL retraining, Therapeutic Activities, UE/LE Coordination activities, Cognitive remediation/compensation, Functional mobility training, Patient/family education, Therapeutic Exercise, Neuromuscular re-education, Psychosocial support, UE/LE Strength taining/ROM  SLP Interventions Cognitive remediation/compensation, Cueing hierarchy, DME/adaptive equipment instruction, Dysphagia/aspiration precaution training, Environmental controls, Functional tasks, Internal/external aids, Medication managment, Multimodal communication approach, Patient/family education, Speech/Language facilitation, Therapeutic Activities  TR Interventions    SW/CM Interventions Discharge Planning, Psychosocial Support, Patient/Family Education    Team Discharge Planning: Destination: PT-Home ,OT- Home , SLP-Home Projected Follow-up: PT-Outpatient PT, OT-  24 hour supervision/assistance, SLP-24 hour supervision/assistance, Outpatient SLP Projected Equipment Needs: PT-To be determined, OT- To be determined, SLP-To be determined Equipment Details: PT- , OT-  Patient/family involved in discharge planning: PT- Family member/caregiver, Patient,  OT-Patient, Family member/caregiver,  SLP-Patient, Family member/caregiver  MD ELOS: 20-24 days Medical Rehab Prognosis:  Excellent Assessment: The patient has been admitted for CIR therapies with the diagnosis of anoxic encephalopathy. The team will be addressing functional mobility, strength, stamina, balance, safety, adaptive techniques and equipment, self-care, bowel and bladder mgt, patient and caregiver education, trach mgt, NMR, pain control, cognition, communication, swallowing. Goals have been set at supervision for mobility and self-care and mod assist with cognition.    Ranelle OysterZachary T. Swartz, MD, [place] DomFAAPMR  See Team Conference Notes for weekly updates to the plan of care

## 2016-05-05 NOTE — Progress Notes (Signed)
Occupational Therapy Session Note  Patient Details  Name: Jane Watkins MRN: [id number] Date of Birth: 02/02/1996  Today's Date: 05/05/2016 OT Individual Time: 1000-1100 OT Individual Time Calculation (min): 60 min    Short Term Goals: Week 1:  OT Short Term Goal 1 (Week 1): Pt will intiate and terminate 1/3 components of toileting  OT Short Term Goal 2 (Week 1): Pt will maintain sustained attention to complete bathing/dressing ADL during 1 OT session OT Short Term Goal 3 (Week 1): Pt will complete toilet transfes with Mod A and LRAD OT Short Term Goal 4 (Week 1): Pt will complete LB dressing at EOB with Mod A sit<stand  Skilled Therapeutic Interventions/Progress Updates:    Pt resting in bed upon arrival with Mom present.  Pt noticeably upset and tearful.  Mom and pt shared that pt was informed last night about the circumstances of her hospitalization and her boyfriend's death.  Referral made to Neuropsych. Discussed LTGs and pt stated she would prefer to have a female therapist assist with BADLs.  Pt transferred to w/c and transitioned to tub room to practice stepping over into tub with steady A.  Pt amb with RW in room and tub room for simple home mgmt tasks (steady A). Pt returned to bed and remained in bed with family present.    Therapy Documentation Precautions:  Precautions Precautions: Fall, Other (comment) Precaution Comments: Trach Restrictions Weight Bearing Restrictions: No  Pain:  Pt denied pain ADL: ADL ADL Comments: Please see functional navigator for ADL status  See Function Navigator for Current Functional Status.   Therapy/Group: Individual Therapy  Rich BraveLanier, Naz Denunzio Chappell 05/05/2016, 12:13 PM

## 2016-05-05 NOTE — Progress Notes (Signed)
Physical Therapy Session Note  Patient Details  Name: Jane Watkins MRN: [id number] Date of Birth: 03/04/1995  Today's Date: 05/05/2016 PT Individual Time: 0800-0900 and 1430-1530 PT Individual Time Calculation (min): 60 min and 60 min (total 120 min)   Short Term Goals: Week 1:  PT Short Term Goal 1 (Week 1): Pt will perform bed mobility modI PT Short Term Goal 2 (Week 1): Pt will perform transfers minA PT Short Term Goal 3 (Week 1): Pt will ambulate x30' with modA PT Short Term Goal 4 (Week 1): Pt will initiate stair training PT Short Term Goal 5 (Week 1): Pt will attend to task >5 min at a time with min cues for redirection  Skilled Therapeutic Interventions/Progress Updates: Tx 1: Pt received seated in bed finishing breakfast, father present. Denies pain and agreeable to treatment. Father present for entire session, mother arrived half way through session to observe. Father expresses concerns about painful LLE; discussed testing done yesterday in therapy, and pt discussed with father all education she received yesterday from this therapist with 100% accuracy. Pt dons shirt and shoes with setupA. Ambulatory transfer to w/c with min guard and RW. Gait [id number]' per trial throughout session, min guard overall and use of RW to unweigh painful LLE.Stairs 1x12 with B handrails and RLE lead for safety; educated pt in variations of gait pattern for safety vs strengthening. Step ups x10 reps LLE on 6" step; minA and tactile cueing at L knee for concentric/eccentric control, pt reports not painful just difficult. Performed car transfer, gait on ramp and uneven mulch surface with RW and min guard. Assessed Berg balance scale with results as below indicating high risk for falls; educated pt and family on high risk for falls. One episode of L knee buckling during berg while turning in a circle; maxA to recover balance with little effort noted by pt to A with preventing fall. Performed biodex catch game on  stable and unstable surface with UE support>no UE support with repetition. Gait to return to room; remained seated in recliner with family present and all needs in reach.   Tx 2: Pt received asleep in bed; difficult to arouse however ultimately able to wake up with use of cold wash cloth and increased time with family assisting to wake pt up and encourage to participate. Bed mobility with modI; stand pivot transfer to w/c with min guard. Gait in/out of bathroom with RW and min guard; one LOB d/t LLE buckling when exiting bathroom on small decline to exit bathroom; modA to recover with improving reaction time and effort to assist in preventing fall. Pt performed clothing management and hygiene with close S due to significant lethargy. Pt remained seated in w/c with RT present to remove trach; pt vitals remained WNL during/after procedure. Pt ambulated outdoors with RW and min guard x150'. W/c propulsion x100' with BUE and S with min cues for navigation and pt's friend assisting with encouraging pt to participate. Pt impulsively stood from unlocked w/c while therapist donning contact room gown; returned safely to w/c and reminded pt about w/c safety to lock brakes and have assist prior to getting up. Transferred back to bed min guard stand pivot. Remained supine in bed at end of session, family present and all needs in reach. Discussed scheduling with family; have been trying to avoid PM sessions d/t pt lethargy and difficulty participating however therapist and pt's mother discussed benefit to encouraging pt to get up later in the day to promote normal sleep/wake cycle  and prepare for d/c home.      Therapy Documentation Precautions:  Precautions Precautions: Fall, Other (comment) Precaution Comments: Trach Restrictions Weight Bearing Restrictions: No Balance Balance Assessed: Yes Standardized Balance Assessment Standardized Balance Assessment: Berg Balance Test Berg Balance Test Sit to Stand: Able  to stand without using hands and stabilize independently Standing Unsupported: Able to stand safely 2 minutes Sitting with Back Unsupported but Feet Supported on Floor or Stool: Able to sit safely and securely 2 minutes Stand to Sit: Sits safely with minimal use of hands Transfers: Able to transfer safely, definite need of hands Standing Unsupported with Eyes Closed: Able to stand 10 seconds with supervision Standing Ubsupported with Feet Together: Able to place feet together independently and stand for 1 minute with supervision From Standing, Reach Forward with Outstretched Arm: Can reach forward >12 cm safely (5") From Standing Position, Pick up Object from Floor: Able to pick up shoe, needs supervision From Standing Position, Turn to Look Behind Over each Shoulder: Looks behind from both sides and weight shifts well Turn 360 Degrees: Needs assistance while turning (buckled) Standing Unsupported, Alternately Place Feet on Step/Stool: Able to complete >2 steps/needs minimal assist Standing Unsupported, One Foot in Front: Able to plae foot ahead of the other independently and hold 30 seconds Standing on One Leg: Able to lift leg independently and hold 5-10 seconds Total Score: 42  See Function Navigator for Current Functional Status.   Therapy/Group: Individual Therapy  Vista Lawman 05/05/2016, 9:07 AM

## 2016-05-05 NOTE — Progress Notes (Signed)
[facility]YSICAL MEDICINE & REHABILITATION     PROGRESS NOTE    Subjective/Complaints: Up walking with therapies. Denies any new issues. Slept ok  ROS: Limited due cognitive/behavioral    Objective: Vital Signs: Blood pressure (!) 92/57, pulse 85, temperature 98.3 F (36.8 C), temperature source Oral, resp. rate 17, height 5\' 2"  (1.575 m), weight 52.2 kg (115 lb), SpO2 98 %. No results found. No results for input(s): WBC, HGB, HCT, PLT in the last 72 hours. No results for input(s): NA, K, CL, GLUCOSE, BUN, CREATININE, CALCIUM in the last 72 hours.  Invalid input(s): CO CBG (last 3)  No results for input(s): GLUCAP in the last 72 hours.  Wt Readings from Last 3 Encounters:  05/01/16 52.2 kg (115 lb)  05/01/16 51.6 kg (113 lb 12.1 oz)  02/02/16 52.2 kg (115 lb)    Physical Exam:  HENT.  Head. Normocephalic Eyes. Pupils round and reactive to light Neck. Tracheostomy tube #4 capped. Voice stronger Cardiovascular.  RRR   Respiratory. CTA B GI. Soft. Bowel sounds normal. She exhibits no distention Skin. Warm and dry Neurological. Makes eye contact. No CN abnl. Repeats words from conversations/slow to process. Limited insight and awareness.  Moves all 4's with at least 4- to 4/5 strength. Senses pain in all 4's. No resting tone or clonus seen. Psych: very flat     Assessment/Plan: 1. Functional and cognitive deficits secondary to anoxic brain injury which require 3+ hours per day of interdisciplinary therapy in a comprehensive inpatient rehab setting. Physiatrist is providing close team supervision and 24 hour management of active medical problems listed below. Physiatrist and rehab team continue to assess barriers to discharge/monitor patient progress toward functional and medical goals.  Function:  Bathing Bathing position Bathing activity did not occur: Refused Position: Wheelchair/chair at sink  Bathing parts Body parts bathed by patient: Right arm, Left arm,  Chest, Abdomen, Right upper leg, Left upper leg, Right lower leg, Left lower leg Body parts bathed by helper: Back  Bathing assist Assist Level: Touching or steadying assistance(Pt > 75%)      Upper Body Dressing/Undressing Upper body dressing   What is the patient wearing?: Pull over shirt/dress     Pull over shirt/dress - Perfomed by patient: Thread/unthread right sleeve, Put head through opening, Thread/unthread left sleeve, Pull shirt over trunk   Button up shirt - Perfomed by patient: Thread/unthread right sleeve, Thread/unthread left sleeve, Pull shirt around back Button up shirt - Perfomed by helper: Button/unbutton shirt    Upper body assist Assist Level: Touching or steadying assistance(Pt > 75%)      Lower Body Dressing/Undressing Lower body dressing   What is the patient wearing?: Shoes Underwear - Performed by patient: Thread/unthread right underwear leg, Thread/unthread left underwear leg Underwear - Performed by helper: Pull underwear up/down Pants- Performed by patient: Thread/unthread right pants leg Pants- Performed by helper: Thread/unthread left pants leg, Pull pants up/down Non-skid slipper socks- Performed by patient: Don/doff right sock, Don/doff left sock       Shoes - Performed by patient: Don/doff right shoe, Don/doff left shoe         TED Hose - Performed by helper: Don/doff right TED hose, Don/doff left TED hose  Lower body assist Assist for lower body dressing: Touching or steadying assistance (Pt > 75%)      Toileting Toileting Toileting activity did not occur: No continent bowel/bladder event   Toileting steps completed by helper: Performs perineal hygiene, Adjust clothing after toileting, Adjust clothing  prior to toileting    Toileting assist Assist level: Two helpers   Transfers Chair/bed transfer Chair/bed transfer activity did not occur: N/A Chair/bed transfer method: Stand pivot Chair/bed transfer assist level: Touching or steadying  assistance (Pt > 75%) Chair/bed transfer assistive device: Armrests     Locomotion Ambulation     Max distance: 175 Assist level: Touching or steadying assistance (Pt > 75%)   Wheelchair   Type: Manual Max wheelchair distance: 100 Assist Level: Touching or steadying assistance (Pt > 75%)  Cognition Comprehension Comprehension assist level: Understands complex 90% of the time/cues 10% of the time  Expression Expression assist level: Expresses complex 90% of the time/cues < 10% of the time  Social Interaction Social Interaction assist level: Interacts appropriately with others with medication or extra time (anti-anxiety, antidepressant).  Problem Solving Problem solving assist level: Solves complex 90% of the time/cues < 10% of the time  Memory Memory assist level: Recognizes or recalls 90% of the time/requires cueing < 10% of the time   Medical Problem List and Plan: 1.  Hypoxic encephalopathy cognitive deficits, balance disorder secondary to drug overdose  -continue CIR therapies 2.  DVT Prophylaxis/Anticoagulation: Subcutaneous Lovenox. Monitor platelet counts and any signs of bleeding. Dopplers pending 3. Pain Management: Tylenol as needed 4. Mood: Klonopin 2 mg twice a day, Prozac 30 mg daily, Seroquel 100 mg twice a day 5. Neuropsych: This patient is not capable of making decisions on her own behalf. 6. Skin/Wound Care: Routine skin checks 7. Fluids/Electrolytes/Nutrition: Routine I&O. Push po---eating little so far but doesn't like diet  -   8. Tracheostomy 04/28/2016 per Dr. Tyson Alias.   -remove trach today. 9. Dysphagia. Dysphagia #1 honey thick liquid diet.   -encourage fluids. Electrolytes and renal functional normal upon review of labs  -continue IVF HS while on this diet    10. MRSA PCR screen positive. Contact precautions 11. Polysubstance tobacco and alcohol abuse. Counseling 12.Tachycardia. Patient normal sinus rhythm. No acute distress. Monitor with increased  mobility 13.Leukocytosis.WBC up to 11.5k.  Patient remains afebrile. No clinical signs of infection  -recheck labs this week   LOS (Days) 4 A FACE TO FACE EVALUATION WAS PERFORMED  Ranelle Oyster, MD 05/05/2016 10:46 AM

## 2016-05-05 NOTE — Progress Notes (Signed)
Rt went to remove the Pt's Trach and the Pt was sleepy. The mom placed cold wash cloths on the Pt's arms and face but the Pt was still to drowsy. The mother stated that the Pt had been given some medicine and that's why she was so sleepy. I asked the mom to tell the nurse when her daughter wakes up and I would remove the trach. RT will continue to monitor

## 2016-05-06 ENCOUNTER — Inpatient Hospital Stay (HOSPITAL_COMMUNITY): Payer: 59 | Admitting: Occupational Therapy

## 2016-05-06 ENCOUNTER — Inpatient Hospital Stay (HOSPITAL_COMMUNITY): Payer: 59 | Admitting: Physical Therapy

## 2016-05-06 ENCOUNTER — Inpatient Hospital Stay (HOSPITAL_COMMUNITY): Payer: 59 | Admitting: Speech Pathology

## 2016-05-06 ENCOUNTER — Encounter (HOSPITAL_COMMUNITY): Payer: 59 | Admitting: Psychology

## 2016-05-06 DIAGNOSIS — T40601A Poisoning by unspecified narcotics, accidental (unintentional), initial encounter: Secondary | ICD-10-CM

## 2016-05-06 MED ORDER — QUETIAPINE FUMARATE 50 MG PO TABS
50.0000 mg | ORAL_TABLET | Freq: Two times a day (BID) | ORAL | Status: DC
Start: 2016-05-06 — End: 2016-05-07
  Administered 2016-05-06 – 2016-05-07 (×2): 50 mg via ORAL
  Filled 2016-05-06 (×2): qty 1

## 2016-05-06 NOTE — Progress Notes (Signed)
Physical Therapy Note  Patient Details  Name: Jane Watkins MRN: [id number] Date of Birth: 08/17/1995 Today's Date: 05/06/2016    Time: 1305  PT attempted to arouse pt x 10 minutes, pt rolls over in bed but refuses to open eyes.  PT and pt's mother attempted to encourage pt to participate but she continues to keep eyes closed.  RN made aware.   Abiha Lukehart 05/06/2016, 1:20 PM
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
## 2016-05-06 NOTE — Progress Notes (Signed)
Occupational Therapy Session Note  Patient Details  Name: Jane Watkins MRN: [id number] Date of Birth: 09/19/1995  Today's Date: 05/06/2016 OT Individual Time: 6213-08650830-0930 OT Individual Time Calculation (min): 60 min    Short Term Goals: Week 1:  OT Short Term Goal 1 (Week 1): Pt will intiate and terminate 1/3 components of toileting  OT Short Term Goal 2 (Week 1): Pt will maintain sustained attention to complete bathing/dressing ADL during 1 OT session OT Short Term Goal 3 (Week 1): Pt will complete toilet transfes with Mod A and LRAD OT Short Term Goal 4 (Week 1): Pt will complete LB dressing at EOB with Mod A sit<stand  Skilled Therapeutic Interventions/Progress Updates:    Pt seen this session to facilitate balance with self care skills. Pt received in bed, anxious for a shower.  Stoma site covered with aqua guard patch.  Pt used RW  To ambulate to toilet to undress and then into shower with steadying A. She demonstrated L leg stability with forward movement patterns and with static standing in the shower.  She sat for most of the shower but wanted to stand and turn around. Cued pt to keep a hand on the bar for safety.  She dried off and then used RW back to bed.  She then needed to side step to her R to get around a chair.  She had a LOB to the R with mod A to recover.  Pt felt that her feet may have still been wet.  This clinician also noticed that as pt was moving to the R she leaned in with her R shoulder and did not advance R foot for support first. She did not have the reaction time to lunge R foot out to side for support.  Discussed this and made plan to focus some of the treatment exercises on this. Dressed from EOB with supervision.   She then worked on side stepping to L 2x, R 2x with balance support from therapist.  Worked on increasing speed with movement, as step speed increased she had a LOB to L with min A to recover.  Slowed pace down integrating squats into lateral steps.  Squats  12x.  Pt understood rational for treatment and had questions about her injury and why she had more weakness in her LLE.     Pt has progressed a considerable amount, but continues to need close supervision with self care and steadying A with mobility skills due to decreased protective responses, reaction time with foot movement, LLE strength and balance.  Her cognitive skills of attention and initiation have improved in that she did not need any cuing.    Pt resting in bed for next session with her boyfriend in the room with her.   Therapy Documentation Precautions:  Precautions Precautions: Fall, Other (comment) Precaution Comments: Trach Restrictions Weight Bearing Restrictions: No   Pain: no c/o pain    ADL: ADL ADL Comments: Please see functional navigator for ADL status   See Function Navigator for Current Functional Status.   Therapy/Group: Individual Therapy  SAGUIER,JULIA 05/06/2016, 12:24 PM

## 2016-05-06 NOTE — Progress Notes (Signed)
[place] PHYSICAL MEDICINE & REHABILITATION     PROGRESS NOTE    Subjective/Complaints: No new complaints. Happy trach is out. Air leaking from dressing  ROS: Limited due cognitive/behavioral    Objective: Vital Signs: Blood pressure 114/72, pulse 83, temperature 98.9 F (37.2 C), temperature source Oral, resp. rate 16, height 5\' 2"  (1.575 m), weight 52.2 kg (115 lb), SpO2 98 %. No results found. No results for input(s): WBC, HGB, HCT, PLT in the last 72 hours. No results for input(s): NA, K, CL, GLUCOSE, BUN, CREATININE, CALCIUM in the last 72 hours.  Invalid input(s): CO CBG (last 3)  No results for input(s): GLUCAP in the last 72 hours.  Wt Readings from Last 3 Encounters:  05/01/16 52.2 kg (115 lb)  05/01/16 51.6 kg (113 lb 12.1 oz)  02/02/16 52.2 kg (115 lb)    Physical Exam:  HENT.  Head. Normocephalic Eyes. Pupils round and reactive to light Neck. Trach stoma dressed--air leaks when speaking Cardiovascular.  RRR   Respiratory. CTA B GI. Soft. Bowel sounds normal. She exhibits no distention Skin. Warm and dry Neurological. Improving awareness. Remembers me. Carries conversation. Follows commands.  Moves all 4's with at least 4- to 4/5 strength. Senses pain in all 4's. No resting tone or clonus seen. Psych: very flat     Assessment/Plan: 1. Functional and cognitive deficits secondary to anoxic brain injury which require 3+ hours per day of interdisciplinary therapy in a comprehensive inpatient rehab setting. Physiatrist is providing close team supervision and 24 hour management of active medical problems listed below. Physiatrist and rehab team continue to assess barriers to discharge/monitor patient progress toward functional and medical goals.  Function:  Bathing Bathing position Bathing activity did not occur: Refused Position: Wheelchair/chair at sink  Bathing parts Body parts bathed by patient: Right arm, Left arm, Chest, Abdomen, Right upper leg,  Left upper leg, Right lower leg, Left lower leg Body parts bathed by helper: Back  Bathing assist Assist Level: Touching or steadying assistance(Pt > 75%)      Upper Body Dressing/Undressing Upper body dressing   What is the patient wearing?: Pull over shirt/dress     Pull over shirt/dress - Perfomed by patient: Thread/unthread right sleeve, Put head through opening, Thread/unthread left sleeve, Pull shirt over trunk   Button up shirt - Perfomed by patient: Thread/unthread right sleeve, Thread/unthread left sleeve, Pull shirt around back Button up shirt - Perfomed by helper: Button/unbutton shirt    Upper body assist Assist Level: Touching or steadying assistance(Pt > 75%)      Lower Body Dressing/Undressing Lower body dressing   What is the patient wearing?: Shoes Underwear - Performed by patient: Thread/unthread right underwear leg, Thread/unthread left underwear leg Underwear - Performed by helper: Pull underwear up/down Pants- Performed by patient: Thread/unthread right pants leg Pants- Performed by helper: Thread/unthread left pants leg, Pull pants up/down Non-skid slipper socks- Performed by patient: Don/doff right sock, Don/doff left sock       Shoes - Performed by patient: Don/doff right shoe, Don/doff left shoe         TED Hose - Performed by helper: Don/doff right TED hose, Don/doff left TED hose  Lower body assist Assist for lower body dressing: Touching or steadying assistance (Pt > 75%) (patient completed 5/8, 63%, moderate assist)      Toileting Toileting Toileting activity did not occur: No continent bowel/bladder event Toileting steps completed by patient: Adjust clothing prior to toileting, Performs perineal hygiene (per Jobelle Mesias, NT) Toileting  steps completed by helper: Adjust clothing prior to toileting, Performs perineal hygiene, Adjust clothing after toileting    Toileting assist Assist level: More than reasonable time, Supervision or verbal  cues   Transfers Chair/bed transfer Chair/bed transfer activity did not occur: N/A Chair/bed transfer method: Stand pivot Chair/bed transfer assist level: Touching or steadying assistance (Pt > 75%) Chair/bed transfer assistive device: Armrests     Locomotion Ambulation     Max distance: 175 Assist level: Touching or steadying assistance (Pt > 75%)   Wheelchair   Type: Manual Max wheelchair distance: 100 Assist Level: Supervision or verbal cues  Cognition Comprehension Comprehension assist level: Understands complex 90% of the time/cues 10% of the time  Expression Expression assist level: Expresses basic needs/ideas: With no assist  Social Interaction Social Interaction assist level: Interacts appropriately 90% of the time - Needs monitoring or encouragement for participation or interaction.  Problem Solving Problem solving assist level: Solves basic 90% of the time/requires cueing < 10% of the time  Memory Memory assist level: Recognizes or recalls 90% of the time/requires cueing < 10% of the time   Medical Problem List and Plan: 1.  Hypoxic encephalopathy cognitive deficits, balance disorder secondary to drug overdose  -continue CIR therapies--progressing nicely  -team conf today 2.  DVT Prophylaxis/Anticoagulation: Subcutaneous Lovenox. Monitor platelet counts and any signs of bleeding. Dopplers pending 3. Pain Management: Tylenol as needed 4. Mood: Klonopin 2 mg twice a day, Prozac 30 mg daily, Seroquel 100 mg twice a day 5. Neuropsych: This patient is not capable of making decisions on her own behalf. 6. Skin/Wound Care: Routine skin checks 7. Fluids/Electrolytes/Nutrition: Routine I&O. Push po---eating little so far but doesn't like diet  -   8. Tracheostomy 04/28/2016 per Dr. Tyson AliasFeinstein.   -trach out---occlusive dressing. 9. Dysphagia. Dysphagia #1 honey thick liquid diet----MBS today with SLP   -encourage fluids. Electrolytes and renal functional normal upon review of  labs  -continue IVF HS until liquids upgraded    10. MRSA PCR screen positive. Contact precautions 11. Polysubstance tobacco and alcohol abuse. Counseling 12.Tachycardia. Patient normal sinus rhythm. No acute distress. Monitor with increased mobility 13.Leukocytosis.WBC up to 11.5k.  Patient remains afebrile. No clinical signs of infection  -recheck labs Thursday   LOS (Days) 5 A FACE TO FACE EVALUATION WAS PERFORMED  Ranelle OysterSWARTZ,Silverio Hagan T, MD 05/06/2016 12:03 PM

## 2016-05-06 NOTE — Care Management Note (Signed)
Inpatient Rehabilitation Center Individual Statement of Services  Patient Name:  Jane Watkins  Date:  05/05/2016  Welcome to the Inpatient Rehabilitation Center.  Our goal is to provide you with an individualized program based on your diagnosis and situation, designed to meet your specific needs.  With this comprehensive rehabilitation program, you will be expected to participate in at least 3 hours of rehabilitation therapies Monday-Friday, with modified therapy programming on the weekends.  Your rehabilitation program will include the following services:  Physical Therapy (PT), Occupational Therapy (OT), Speech Therapy (ST), 24 hour per day rehabilitation nursing, Therapeutic Recreaction (TR), Neuropsychology, Case Management (Social Worker), Rehabilitation Medicine, Nutrition Services and Pharmacy Services  Weekly team conferences will be held on Tuesdays to discuss your progress.  Your Social Worker will talk with you frequently to get your input and to update you on team discussions.  Team conferences with you and your family in attendance may also be held.  Expected length of stay: 7-10 days  Overall anticipated outcome: supervision  Depending on your progress and recovery, your program may change. Your Social Worker will coordinate services and will keep you informed of any changes. Your Social Worker's name and contact numbers are listed  below.  The following services may also be recommended but are not provided by the Inpatient Rehabilitation Center:   Driving Evaluations  Home Health Rehabiltiation Services  Outpatient Rehabilitation Services  Vocational Rehabilitation   Arrangements will be made to provide these services after discharge if needed.  Arrangements include referral to agencies that provide these services.  Your insurance has been verified to be:  [facility] Your primary doctor is:  None  Pertinent information will be shared with your doctor and your insurance  company.  Social Worker:  [place], [place] [phone] or ([phone]) [phone]   Information discussed with and copy given to patient by: Amada JupiterHOYLE, Coltrane Tugwell, 05/05/2016, 4:23 PM

## 2016-05-06 NOTE — Progress Notes (Addendum)
Speech Language Pathology Daily Session Note  Patient Details  Name: Jane Watkins MRN: [id number] Date of Birth: 01/20/1996  Today's Date: 05/06/2016 SLP Individual Time: 1030-1130 SLP Individual Time Calculation (min): 60 min  Short Term Goals: Week 1: SLP Short Term Goal 1 (Week 1): Pt will consume puree with honey thick with Max A multimodal cues for compensatory swallow strategies.  SLP Short Term Goal 2 (Week 1): Pt will consume trials of ice chips without overt s/s of aspiration to demonstrate readiness for repeat instrumental study.  SLP Short Term Goal 3 (Week 1): Pt will tolerate PMSV placement for 1 to 2 breath cycles without respiratory decline.  SLP Short Term Goal 4 (Week 1): Pt will demonstrate focused attention for 2 minutes with supervision cues.  SLP Short Term Goal 5 (Week 1): Pt will use multimodal communication to communicate basic wants and needs with ~ 50% accuracy and Min A cues.   Skilled Therapeutic Interventions: Skilled treatment session focused on addressing dysphagia and cogniiton goals. SLP facilitated session by providing Min guard/steady assist with rolling walker to walk to the sink, she completed oral care with extra time, and then consumed trials with set-up assist and Min-Mod verbal cues for portion control and pacing.  Patient consumed thin liquids via cup, Dys.2 textures, Dys.3 textures and regular textures, with throat clear following regular texture trials.  SLP then provided Mod encouragement for consecutive sips to complete the 3 oz. Chug challenge with weak reflexive cough response.  SLP educated on need to provide light pressure to stoma covering for seal and to promote healing with talking and coughing.  Patient able to state that she was told that but required Max assist verbal cues to carryover.  SLP also facilitated session with schedule task and Supervision level question cues to calculate times of therapy and between therapy sessions.  Plan to repeat  objective swallow assessment tomorrow prior to diet upgrade given documented sensory deficits.  Educated patient and family on recommendations to continue with current plan of care for now.    Function:  Eating Eating   Modified Consistency Diet: No (trials with SLP) Eating Assist Level: Set up assist for;Supervision or verbal cues;Helper checks for pocketed food   Eating Set Up Assist For: Opening containers;Cutting food Helper Scoops Food on Utensil: Occasionally Helper Brings Food to Mouth: Occasionally   Cognition Comprehension Comprehension assist level: Understands basic 90% of the time/cues < 10% of the time  Expression   Expression assist level: Expresses basic 75 - 89% of the time/requires cueing 10 - 24% of the time. Needs helper to occlude trach/needs to repeat words.  Social Interaction Social Interaction assist level: Interacts appropriately 75 - 89% of the time - Needs redirection for appropriate language or to initiate interaction.  Problem Solving Problem solving assist level: Solves basic 90% of the time/requires cueing < 10% of the time  Memory Memory assist level: Recognizes or recalls 50 - 74% of the time/requires cueing 25 - 49% of the time    Pain Pain Assessment Pain Assessment: No/denies pain  Therapy/Group: Individual Therapy  Charlane FerrettiMelissa Lizzette Carbonell, M.A., CCC-SLP [phone]  Jacquelyne Quarry 05/06/2016, 1:09 PM
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
## 2016-05-06 NOTE — Progress Notes (Signed)
Physical Therapy Session Note  Patient Details  Name: Jane Watkins MRN: [id number] Date of Birth: 08/07/1995  Today's Date: 05/06/2016 PT Individual Time: 1500-1530 PT Individual Time Calculation (min): 30 min   Short Term Goals: Week 1:  PT Short Term Goal 1 (Week 1): Pt will perform bed mobility modI PT Short Term Goal 2 (Week 1): Pt will perform transfers minA PT Short Term Goal 3 (Week 1): Pt will ambulate x30' with modA PT Short Term Goal 4 (Week 1): Pt will initiate stair training PT Short Term Goal 5 (Week 1): Pt will attend to task >5 min at a time with min cues for redirection  Skilled Therapeutic Interventions/Progress Updates:    Pt in bed upon arrival, difficult to arouse initially but becoming more alert and cooperative with gradual mobility. Pt willing to initially extend feet to place shoes on and gradually sit EOB. Static sitting balance performed on bed and in gym mat table. Pt able to sit criss-cross on mat but had posterior loss of balance with max assist to recover. Pt did not demonstrate any balance reactions once starting to fall. Pt able to ambulate from room to small gym, initially using rw and HHA upon return. Pt requesting to return to bed following session. Pt in bed with rails up, family present. Call light in reach. Pt did c/o Lt knee pain, localized to anterolateral joint line. No observed antalgia with ambulation.   Therapy Documentation Precautions:  Precautions Precautions: Fall, Other (comment) Precaution Comments: Trach Restrictions Weight Bearing Restrictions: No   Pain: Indicates pain in Lt knee. Non-antalgic gait. Indicates pain at the anterolateral lt knee joint line. Monitored during session.   See Function Navigator for Current Functional Status.   Therapy/Group: Individual Therapy  Delton SeeBenjamin Elizeth Weinrich, PT 05/06/2016, 4:08 PM

## 2016-05-06 NOTE — Progress Notes (Signed)
Physical Therapy Session Note  Patient Details  Name: Jane Watkins MRN: [id number] Date of Birth: [birth year]  Today's Date: 05/06/2016 PT Individual Time: 1000-1030 PT Individual Time Calculation (min): 30 min   Short Term Goals: Week 1:  PT Short Term Goal 1 (Week 1): Pt will perform bed mobility modI PT Short Term Goal 2 (Week 1): Pt will perform transfers minA PT Short Term Goal 3 (Week 1): Pt will ambulate x30' with modA PT Short Term Goal 4 (Week 1): Pt will initiate stair training PT Short Term Goal 5 (Week 1): Pt will attend to task >5 min at a time with min cues for redirection  Skilled Therapeutic Interventions/Progress Updates: Pt presented sitting at EOB with family present agreeable to therapy. Nsg also present to administer meds. Performed treatment session in room for time management. Performed coordination and quick alternating activities to increase reaction time. Pt hitting target approx 60% of trials with LLE. Performed side stepping L/R with RW no LOB, demonstrating good safety. Pt returned to bed for administration of meds. Left pt with family in room and all needs met.      Therapy Documentation Precautions:  Precautions Precautions: Fall, Other (comment) Precaution Comments: Trach Restrictions Weight Bearing Restrictions: No   Other Treatments:     See Function Navigator for Current Functional Status.   Therapy/Group: Individual Therapy  Yousuf Ager  Roneshia Drew, PTA  05/06/2016, 12:14 PM

## 2016-05-07 ENCOUNTER — Encounter (HOSPITAL_COMMUNITY): Payer: 59 | Admitting: Psychology

## 2016-05-07 ENCOUNTER — Inpatient Hospital Stay (HOSPITAL_COMMUNITY): Payer: 59 | Admitting: Physical Therapy

## 2016-05-07 ENCOUNTER — Inpatient Hospital Stay (HOSPITAL_COMMUNITY): Payer: 59

## 2016-05-07 ENCOUNTER — Inpatient Hospital Stay (HOSPITAL_COMMUNITY): Payer: 59 | Admitting: Occupational Therapy

## 2016-05-07 ENCOUNTER — Encounter (HOSPITAL_COMMUNITY): Payer: 59 | Admitting: Speech Pathology

## 2016-05-07 DIAGNOSIS — T40601D Poisoning by unspecified narcotics, accidental (unintentional), subsequent encounter: Secondary | ICD-10-CM

## 2016-05-07 DIAGNOSIS — Z720 Tobacco use: Secondary | ICD-10-CM

## 2016-05-07 MED ORDER — CLONAZEPAM 0.5 MG PO TABS
0.5000 mg | ORAL_TABLET | Freq: Every day | ORAL | Status: DC
  Administered 2016-05-07: 0.5 mg via ORAL
  Filled 2016-05-07: qty 1

## 2016-05-07 MED ORDER — QUETIAPINE FUMARATE 25 MG PO TABS
25.0000 mg | ORAL_TABLET | Freq: Two times a day (BID) | ORAL | Status: DC
  Administered 2016-05-07: 25 mg via ORAL
  Filled 2016-05-07 (×2): qty 1

## 2016-05-07 NOTE — Progress Notes (Signed)
Social Work Patient ID: Jane Watkins, female   DOB: 09-16-1995, 21 y.o.   MRN: [id number]   Met with pt and family following team conference.  All aware and agreeable with targeted d/c date of 3/30, however, this morning pt was stating she would be leaving Thursday - corrected this with her.  Will coordinate therapy follow as well as recommendations for drug/ [facility] recommendations per neuropsychology.  Continue to follow.  Jane Raygoza, LCSW

## 2016-05-07 NOTE — Progress Notes (Signed)
[facility] PHYSICAL MEDICINE & REHABILITATION     PROGRESS NOTE    Subjective/Complaints: No new complaints. Happy trach is out. Air leaking from dressing  ROS: Limited due cognitive/behavioral    Objective: Vital Signs: Blood pressure 112/65, pulse 74, temperature 98 F (36.7 C), temperature source Oral, resp. rate 16, height 5\' 2"  (1.575 m), weight 49.5 kg (109 lb 1.6 oz), last menstrual period 04/19/2016, SpO2 99 %. Dg Swallowing Func-speech Pathology  Result Date: 05/07/2016 Objective Swallowing Evaluation: Type of Study: MBS-Modified Barium Swallow Study Patient Details Name: Jane Watkins MRN: [id number] Date of Birth: 06/08/1995 Today's Date: 05/07/2016 Time: SLP Start Time (ACUTE ONLY): 0917-SLP Stop Time (ACUTE ONLY): 0930 SLP Time Calculation (min) (ACUTE ONLY): 13 min Past Medical History: No past medical history on file. Past Surgical History: Past Surgical History: Procedure Laterality Date . APPENDECTOMY   . MOUTH SURGERY   HPI: 21 y/o F with a history of opiate abuse who was found unresponsive next to her boyfriend (who was deceased). She was intubated 3/10-3/14 but failed extubation due to stridor/swelling and was reintubated 3/14 until trach 3/19.  Subjective: pt alert, calm Assessment / Plan / Recommendation CHL IP CLINICAL IMPRESSIONS 05/07/2016 Clinical Impression Pt with funcitonal oropharyngeal abilities when consuming thin liquids via cup and straw as well as graham cracker and diced peaches with juice. Overall pt with timely initiation and strength of swallow. When consuming multiple sips of thin liquids via straw via straw pt with x1 flash penetration. Cued cough cleared and further consumpiton via straw was not penetrated during remainder of study. Pt with great improvement and recommend regular diet, thin liquids via cup or straw and medicine whole with thin liquids.  SLP Visit Diagnosis Dysphagia, pharyngeal phase (R13.13) Attention and concentration deficit following --  Frontal lobe and executive function deficit following -- Impact on safety and function Mild aspiration risk   CHL IP TREATMENT RECOMMENDATION 05/07/2016 Treatment Recommendations Therapy as outlined in treatment plan below   Prognosis 05/07/2016 Prognosis for Safe Diet Advancement Good Barriers to Reach Goals -- Barriers/Prognosis Comment -- CHL IP DIET RECOMMENDATION 05/07/2016 SLP Diet Recommendations Regular solids;Thin liquid Liquid Administration via Cup;Straw Medication Administration Whole meds with liquid Compensations Minimize environmental distractions;Slow rate;Small sips/bites Postural Changes Seated upright at 90 degrees   CHL IP OTHER RECOMMENDATIONS 05/07/2016 Recommended Consults -- Oral Care Recommendations Oral care BID Other Recommendations --   CHL IP FOLLOW UP RECOMMENDATIONS 05/01/2016 Follow up Recommendations Inpatient Rehab   CHL IP FREQUENCY AND DURATION 04/30/2016 Speech Therapy Frequency (ACUTE ONLY) min 2x/week Treatment Duration 2 weeks      CHL IP ORAL PHASE 05/07/2016 Oral Phase WFL Oral - Pudding Teaspoon -- Oral - Pudding Cup -- Oral - Honey Teaspoon -- Oral - Honey Cup -- Oral - Nectar Teaspoon -- Oral - Nectar Cup -- Oral - Nectar Straw -- Oral - Thin Teaspoon -- Oral - Thin Cup -- Oral - Thin Straw -- Oral - Puree -- Oral - Mech Soft -- Oral - Regular -- Oral - Multi-Consistency -- Oral - Pill -- Oral Phase - Comment --  CHL IP PHARYNGEAL PHASE 05/07/2016 Pharyngeal Phase WFL Pharyngeal- Pudding Teaspoon -- Pharyngeal -- Pharyngeal- Pudding Cup -- Pharyngeal -- Pharyngeal- Honey Teaspoon -- Pharyngeal -- Pharyngeal- Honey Cup -- Pharyngeal -- Pharyngeal- Nectar Teaspoon -- Pharyngeal -- Pharyngeal- Nectar Cup -- Pharyngeal -- Pharyngeal- Nectar Straw -- Pharyngeal -- Pharyngeal- Thin Teaspoon -- Pharyngeal -- Pharyngeal- Thin Cup -- Pharyngeal -- Pharyngeal- Thin Straw -- Pharyngeal -- Pharyngeal- Puree --  Pharyngeal -- Pharyngeal- Mechanical Soft -- Pharyngeal -- Pharyngeal- Regular --  Pharyngeal -- Pharyngeal- Multi-consistency -- Pharyngeal -- Pharyngeal- Pill -- Pharyngeal -- Pharyngeal Comment --  CHL IP CERVICAL ESOPHAGEAL PHASE 05/07/2016 Cervical Esophageal Phase WFL Pudding Teaspoon -- Pudding Cup -- Honey Teaspoon -- Honey Cup -- Nectar Teaspoon -- Nectar Cup -- Nectar Straw -- Thin Teaspoon -- Thin Cup -- Thin Straw -- Puree -- Mechanical Soft -- Regular -- Multi-consistency -- Pill -- Cervical Esophageal Comment -- No flowsheet data found. Jane Watkins 05/07/2016, 9:46 AM              No results for input(s): WBC, HGB, HCT, PLT in the last 72 hours. No results for input(s): NA, K, CL, GLUCOSE, BUN, CREATININE, CALCIUM in the last 72 hours.  Invalid input(s): CO CBG (last 3)  No results for input(s): GLUCAP in the last 72 hours.  Wt Readings from Last 3 Encounters:  05/07/16 49.5 kg (109 lb 1.6 oz)  05/01/16 51.6 kg (113 lb 12.1 oz)  02/02/16 52.2 kg (115 lb)    Physical Exam:  HENT.  Head. Normocephalic Eyes. Pupils round and reactive to light Neck. Trach stoma dressed--air leaks when speaking Cardiovascular.  RRR   Respiratory. CTA B GI. Soft. Bowel sounds normal. She exhibits no distention Skin. Warm and dry Neurological. Improving awareness. Remembers me. Carries conversation. Follows commands.  Moves all 4's with at least 4- to 4/5 strength. Senses pain in all 4's. No resting tone or clonus seen. Psych: very flat     Assessment/Plan: 1. Functional and cognitive deficits secondary to anoxic brain injury which require 3+ hours per day of interdisciplinary therapy in a comprehensive inpatient rehab setting. Physiatrist is providing close team supervision and 24 hour management of active medical problems listed below. Physiatrist and rehab team continue to assess barriers to discharge/monitor patient progress toward functional and medical goals.  Function:  Bathing Bathing position Bathing activity did not occur: Refused Position: Shower  Bathing  parts Body parts bathed by patient: Right arm, Left arm, Chest, Abdomen, Right upper leg, Left upper leg, Right lower leg, Left lower leg, Front perineal area, Buttocks Body parts bathed by helper: Back  Bathing assist Assist Level: Supervision or verbal cues      Upper Body Dressing/Undressing Upper body dressing   What is the patient wearing?: Bra, Pull over shirt/dress Bra - Perfomed by patient: Thread/unthread right bra strap, Thread/unthread left bra strap, Hook/unhook bra (pull down sports bra)   Pull over shirt/dress - Perfomed by patient: Thread/unthread right sleeve, Put head through opening, Thread/unthread left sleeve, Pull shirt over trunk   Button up shirt - Perfomed by patient: Thread/unthread right sleeve, Thread/unthread left sleeve, Pull shirt around back Button up shirt - Perfomed by helper: Button/unbutton shirt    Upper body assist Assist Level: Supervision or verbal cues      Lower Body Dressing/Undressing Lower body dressing   What is the patient wearing?: Underwear, Pants, Non-skid slipper socks Underwear - Performed by patient: Thread/unthread right underwear leg, Thread/unthread left underwear leg, Pull underwear up/down Underwear - Performed by helper: Pull underwear up/down Pants- Performed by patient: Thread/unthread right pants leg, Thread/unthread left pants leg, Pull pants up/down Pants- Performed by helper: Thread/unthread left pants leg, Pull pants up/down Non-skid slipper socks- Performed by patient: Don/doff right sock, Don/doff left sock       Shoes - Performed by patient: Don/doff right shoe, Don/doff left shoe         TED Hose -  Performed by helper: Don/doff right TED hose, Don/doff left TED hose  Lower body assist Assist for lower body dressing: Supervision or verbal cues      Toileting Toileting Toileting activity did not occur: No continent bowel/bladder event Toileting steps completed by patient: Adjust clothing prior to toileting,  Performs perineal hygiene, Adjust clothing after toileting Toileting steps completed by helper: Adjust clothing prior to toileting, Performs perineal hygiene, Adjust clothing after toileting    Toileting assist Assist level: Supervision or verbal cues   Transfers Chair/bed transfer Chair/bed transfer activity did not occur: N/A Chair/bed transfer method: Ambulatory Chair/bed transfer assist level: Touching or steadying assistance (Pt > 75%) Chair/bed transfer assistive device: Armrests     Locomotion Ambulation     Max distance:  (150 ft) Assist level: Touching or steadying assistance (Pt > 75%)   Wheelchair   Type: Manual Max wheelchair distance: 100 Assist Level: Supervision or verbal cues  Cognition Comprehension Comprehension assist level: Follows basic conversation/direction with no assist  Expression Expression assist level: Expresses basic needs/ideas: With no assist  Social Interaction Social Interaction assist level: Interacts appropriately 90% of the time - Needs monitoring or encouragement for participation or interaction.  Problem Solving Problem solving assist level: Solves basic problems with no assist  Memory Memory assist level: More than reasonable amount of time   Medical Problem List and Plan: 1.  Hypoxic encephalopathy cognitive deficits, balance disorder secondary to drug overdose  -continue CIR therapies--progressing nicely  -had discussion with pt and family member about nicotine/smoking. Everyone clear that she should NOT be smoking 2.  DVT Prophylaxis/Anticoagulation: Subcutaneous Lovenox. Monitor platelet counts and any signs of bleeding. Dopplers pending 3. Pain Management: Tylenol as needed 4. Mood: Klonopin 2 mg twice a day, Prozac 30 mg daily, Seroquel 100 mg twice a day 5. Neuropsych: This patient is not capable of making decisions on her own behalf. 6. Skin/Wound Care: Routine skin checks 7. Fluids/Electrolytes/Nutrition: Routine I&O. Push  po---eating little so far but doesn't like diet  -   8. Tracheostomy 04/28/2016 per Dr. Tyson Alias.   -trach out---occlusive dressing.--stoma closing---pt occluding 9. Dysphagia. Dysphagia #1 honey thick liquid diet----MBS today with SLP   -encourage fluids. Electrolytes and renal functional normal upon review of labs  -continue IVF HS until liquids upgraded    10. MRSA PCR screen positive. Contact precautions 11. Polysubstance tobacco and alcohol abuse. Counseling 12.Tachycardia. Patient normal sinus rhythm. No acute distress. Monitor with increased mobility 13.Leukocytosis.WBC up to 11.5k.  Patient remains afebrile. No clinical signs of infection  -recheck labs tomorrow   LOS (Days) 6 A FACE TO FACE EVALUATION WAS PERFORMED  Ranelle Oyster, MD 05/07/2016 12:58 PM

## 2016-05-07 NOTE — Progress Notes (Signed)
Physical Therapy Session Note  Patient Details  Name: Jane Watkins MRN: [id number] Date of Birth: 09/06/1995  Today's Date: 05/07/2016 PT Individual Time: 1300-1355 PT Individual Time Calculation (min): 55 min   Short Term Goals: Week 1:  PT Short Term Goal 1 (Week 1): Pt will perform bed mobility modI PT Short Term Goal 2 (Week 1): Pt will perform transfers minA PT Short Term Goal 3 (Week 1): Pt will ambulate x30' with modA PT Short Term Goal 4 (Week 1): Pt will initiate stair training PT Short Term Goal 5 (Week 1): Pt will attend to task >5 min at a time with min cues for redirection  Skilled Therapeutic Interventions/Progress Updates: Tx 1: Pt received asleep in bed, lethargic and difficult to arouse with multimodal stimulation however ultimately got up to participate in therapy. Pt dons shoes with setupA. Transfer stand pivot to w/c with minA provided by pt's mother in law. Outside pt ambulated x160' with no AD and min guard, second trial LLE buckled requiring maxA to recover and prevent fall. Discussed with pt and two friends recommendation that she continue to use RW after d/c home d/t continued knee pain, buckling, especially when fatigued. Also discussed with pt follow up OP therapy for continued strengthening and balance training to reduce falls risk and promote independence. Returned to room totalA. Stand pivot to return to bed with S. Remained supine in bed at end of session, encouraged family to help pt stay awake for upcoming neuropsych visit.   Tx 2: Pt received asleep in bed; easily awoken however pt politely declining participation in therapy d/t fatigue. Will continue to follow per POC.      Therapy Documentation Precautions:  Precautions Precautions: Fall, Other (comment) Precaution Comments: Trach Restrictions Weight Bearing Restrictions: No General: PT Amount of Missed Time (min): 30 Minutes PT Missed Treatment Reason: Patient fatigue   See Function Navigator for  Current Functional Status.   Therapy/Group: Individual Therapy  Vista Lawmanlizabeth J Tygielski 05/07/2016, 1:59 PM

## 2016-05-07 NOTE — Progress Notes (Signed)
Occupational Therapy Session Note  Patient Details  Name: Jane Watkins MRN: [id number] Date of Birth: 10/18/1995  Today's Date: 05/07/2016 OT Individual Time: 1100-1156 OT Individual Time Calculation (min): 56 min    Short Term Goals: Week 1:  OT Short Term Goal 1 (Week 1): Pt will intiate and terminate 1/3 components of toileting  OT Short Term Goal 2 (Week 1): Pt will maintain sustained attention to complete bathing/dressing ADL during 1 OT session OT Short Term Goal 3 (Week 1): Pt will complete toilet transfes with Mod A and LRAD OT Short Term Goal 4 (Week 1): Pt will complete LB dressing at EOB with Mod A sit<stand  Skilled Therapeutic Interventions/Progress Updates:    Upon entering the room, pt supine in bed with RN present giving medications. Pt ambulating in room with min HHA to obtain clothing items from dresser. Pt ambulating to bathroom and performing LB clothing management and hygiene for toileting with overall supervision. Pt engaged in bathing at shower level while seated on TTB for entirety. Pt standing and having 1 LOB with L knee buckling and pt needing min A to correct. Pt transferred to sit in recliner chair and don clothing items with overall supervision this session for min cues for safety awareness and balance. Pt returned to bed at end of session with bed alarm activated and call bell within reach. Visitor present in room as well.   Therapy Documentation Precautions:  Precautions Precautions: Fall, Other (comment) Precaution Comments: Trach Restrictions Weight Bearing Restrictions: No General:   Vital Signs:   Pain:   ADL: ADL ADL Comments: Please see functional navigator for ADL status Exercises:   Other Treatments:    See Function Navigator for Current Functional Status.   Therapy/Group: Individual Therapy  Alen BleacherBradsher, Deacon Gadbois P 05/07/2016, 12:03 PM

## 2016-05-07 NOTE — Progress Notes (Signed)
Speech Language Pathology Daily Session Note  Patient Details  Name: Jane Watkins MRN: [id number] Date of Birth: 10/24/1995  Today's Date: 05/07/2016 SLP Individual Time: 0945-1000 SLP Individual Time Calculation (min): 15 min  Short Term Goals: Week 1: SLP Short Term Goal 1 (Week 1): Pt will consume puree with honey thick with Max A multimodal cues for compensatory swallow strategies.  SLP Short Term Goal 2 (Week 1): Pt will consume trials of ice chips without overt s/s of aspiration to demonstrate readiness for repeat instrumental study.  SLP Short Term Goal 3 (Week 1): Pt will tolerate PMSV placement for 1 to 2 breath cycles without respiratory decline.  SLP Short Term Goal 4 (Week 1): Pt will demonstrate focused attention for 2 minutes with supervision cues.  SLP Short Term Goal 5 (Week 1): Pt will use multimodal communication to communicate basic wants and needs with ~ 50% accuracy and Min A cues.   Skilled Therapeutic Interventions:   Skilled treatment session focused on self-care and education on current diet. Pt able to demonstrate understanding, asked appropriate questions and sustained attention for ~15 minutes. Pt was left in bed, family present and all needs within reach. Diet upgraded to regular with thin liquids. Continue per current plan of care.   Function:    Cognition Comprehension Comprehension assist level: Follows basic conversation/direction with no assist  Expression   Expression assist level: Expresses basic needs/ideas: With no assist  Social Interaction Social Interaction assist level: Interacts appropriately 90% of the time - Needs monitoring or encouragement for participation or interaction.  Problem Solving Problem solving assist level: Solves basic problems with no assist  Memory Memory assist level: More than reasonable amount of time    Pain    Therapy/Group: Individual Therapy  Alexy Bringle Dreama SaaOverton 05/07/2016, 12:36 PM

## 2016-05-07 NOTE — Patient Care Conference (Signed)
Inpatient RehabilitationTeam Conference and Plan of Care Update Date: 05/06/2016   Time: 2:10 PM    Patient Name: Jane Watkins      Medical Record Number: [id number]  Date of Birth: 25-Jun-1995 Sex: Female         Room/Bed: 4W12C/4W12C-01 Payor Info: Payor: Advertising copywriter / Plan: Advertising copywriter OTHER / Product Type: *No Product type* /    Admitting Diagnosis: Acutw Encephalopagthy After Drug Overdose  Admit Date/Time:  05/01/2016  4:39 PM Admission Comments: No comment available   Primary Diagnosis:  Hypoxic-ischemic encephalopathy Principal Problem: Hypoxic-ischemic encephalopathy  Patient Active Problem List   Diagnosis Date Noted  . TBI (traumatic brain injury) (HCC) 05/01/2016  . 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 Respiratory distress   . Glasgow coma scale total score 3-8 (HCC)   . Pneumonia of both lower lobes due to methicillin resistant Staphylococcus aureus (MRSA) (HCC)   . Acute pulmonary edema (HCC)   . Non-traumatic rhabdomyolysis   . Opioid abuse   . Elevated troponin   . Acute respiratory failure with hypoxia (HCC)   . Drug overdose   . Elevated liver enzymes   . Encephalopathy acute   . Encephalopathy 04/19/2016  . Opiate overdose 04/19/2016    Expected Discharge Date: Expected Discharge Date: 05/09/16  Team Members Present: Physician leading conference: Dr. Faith Rogue Social Worker Present: Amada Jupiter, LCSW Nurse Present: Carmie End, RN PT Present: Karolee Stamps, PT OT Present: Johnsie Cancel, OT;Ardis Rowan, COTA SLP Present: Reuel Derby, SLP PPS Coordinator present : Tora Duck, RN, CRRN     Current Status/Progress Goal Weekly Team Focus  Medical   improving cognitive status after anoxic brain injury. trach out--no complications  improve sleep cycle and cognition  trach, cognitive perceptual awarenes   Bowel/Bladder   pt is continent of bowel and bladder  continued continence  continued continance withut the need to wear depends when  sleeping   Swallow/Nutrition/ Hydration   Dys.1 textures and honey-thick liquids via teaspoon   least restrictive PO intake with Mod assist  repeat objective assessment 3/18 at 0900, family education    ADL's   min A overall, fatigues easily  supervision overall  activity tolerance, functional transfers, standing balance, cognitive remediation, family educaiton   Mobility   min guard overall with RW, however occasional severe buckling LLE requiring max/totalA to recover; decreased arousal/participation in PM sessions  S overall  activity tolerance, LE strengthening, dynamic standing balance, pt/family education   Communication   Min assist for vocal intensity with stoma air leak  Mod assist   recall and carryover of light pressure to stome with communication    Safety/Cognition/ Behavioral Observations  Supervision-Mod assist   Mod assist   more carryover and consistency, family education    Pain   pain of 4-5 in left knee and leg  continue to improve with use of tylenol  continue to strengthen and improve with mobility without increase in pain   Skin   slightly pikl healed area on rt cheek where pt had a blister/ dressing over trach site/trach removed yesterday.... healing  resolve rt cheek area/ continued healing of trach site without ny signs or symptoms of infection  closing of trach area with healthy granulation tissue/ no sing or symtoms of infection    Rehab Goals Patient on target to meet rehab goals: Yes *See Care Plan and progress notes for long and short-term goals.  Barriers to Discharge: safety/cognition/decreasd balance    Possible Resolutions to Barriers:  supervision, continued  cogntive based therapies, balance therapy    Discharge Planning/Teaching Needs:  Plan to d/c to her own apt with family/ friends providing 24/7 supervision.  TEaching is ongoing.   Team Discussion:  More alert today; cognition improved overall.  Currently min assist with rw;  Left LE with  some buckling.  Swallow study tomorrow and anticipate upgrading.  Pt requesting cigarettes - MD aware.  Cont b/b.  Neuropsychology following.  Revisions to Treatment Plan:  None   Continued Need for Acute Rehabilitation Level of Care: The patient requires daily medical management by a physician with specialized training in physical medicine and rehabilitation for the following conditions: Daily direction of a multidisciplinary physical rehabilitation program to ensure safe treatment while eliciting the highest outcome that is of practical value to the patient.: Yes Daily medical management of patient stability for increased activity during participation in an intensive rehabilitation regime.: Yes Daily analysis of laboratory values and/or radiology reports with any subsequent need for medication adjustment of medical intervention for : Neurological problems;Pulmonary problems  Kalianna Verbeke 05/07/2016, 2:07 PM

## 2016-05-07 NOTE — Progress Notes (Addendum)
Physical Therapy Session Note  Patient Details  Name: Jane Watkins MRN: [id number] Date of Birth: 10/22/1995  Today's Date: 05/07/2016 PT Individual Time: 0801-0828 PT Individual Time Calculation (min): 27 min    Skilled Therapeutic Interventions/Progress Updates:    Treatment session today focused on safety with functional tasks as well as sustained attention.  Pt continues to demonstrate L knee buckling with stairs and demos decreased safety awareness with appropriate safety techniques for ascending and descending stairs to control L knee buckling.  Worked on tandem ambulation to improve balance, ambulation with RW, and also with standing balance with eyes closed.  Pt was left in bed following session with rails up and bed alarm set and call bell/phone in reach.  Family in room.  Pt reported 6/10 pain in L knee at onset of PT, however, declined meds from nursing.  Therapy Documentation Precautions:  Precautions Precautions: Fall, Other (comment) Precaution Comments: Trach Restrictions Weight Bearing Restrictions: No   See Function Navigator for Current Functional Status.   Therapy/Group: Individual Therapy  Jane Watkins 05/07/2016, 9:55 AM

## 2016-05-07 NOTE — Progress Notes (Signed)
Speech Language Pathology Note  Patient Details  Name: Jane Watkins MRN: [id number] Date of Birth: 12/10/1995 Today's Date: 05/07/2016  MBSS completed, see full report under imaging section.   Jane Watkins, M.S., CCC-SLP Speech-Language Pathologist    Jane Watkins 05/07/2016, 9:45 AM

## 2016-05-07 NOTE — Consult Note (Signed)
Neuropsychological consultation   Patient:  Jane Watkins   DOB: 1995/07/01  MR Number: [id number]  Location: [facility] [id number] [place] [address] Dept: [phone] Loc: [phone]  Start: 2 PM End: 3 PM  Provider/Observer:    Arley Phenix, Psy.D.    Chief Complaint:     Opiate abuse, recovery from hypoxic event  Reason For Service:     The patient was referred for neuropsychological consultation. The patient overdosed and was unconscious for many hours until being found unresponsive.  Below is information derived from the patient's medical chart regarding the initial admission.  Jane Course Skoglundis a 21 y.o.right handed femalewith history of opiate abuse as well as depression and tobacco abuse. Per chart review patient lived alone and was independent prior to admission. Current plan is to discharge to her apartment with her father staying with her and providing assistance or she may discharge to her father and stepmom's home who can provide care. Presented 04/19/2016 after being found next to her boyfriend who was deceased. She was unresponsive. She was brought initially to [facility] combative requiring intubation. Her CK was 15,000 with mild lactic acidosis. Mildly elevated troponin felt to be related to demand ischemia. Cranial CT scan negative. Follow-up MRI of the brain unremarkable 04/22/2016. UDS positive for opiates. EEG consistent with severe nonspecific diffuse cerebral dysfunction with no seizure activity. Echocardiogram with ejection fraction of 70% no wall motion abnormalities. Maintain on ventilatory support with failed extubation and ultimately with percutaneous tracheostomy placement 04/28/2016. Attempts to place a nasogastric tube was unsuccessful currently on IV fluids for hydration and plan modified barium swallow completed 04/30/2016 and placed on a dysphagia #1  honey thick liquid diet.  The patient presented to emergency department on 04/19/2016 after being found unresponsive next to a boyfriend who was deceased. Both of them had overdosed on heroin that was likely laced with other powerful opiates. The patient reports that they did not inject this hair when but snorted the hair when. The patient has been through a long hospital stay and is now on the neuro rehabilitation unit for further rehabilitation and recovery efforts. The patient has been having a lot of difficulty from a emotional standpoint with regard to adjustment. The patient talks about what transpired before she and her boyfriend overdosed and her shame, guilt, remorse about what it happen. The patient describes symptoms consistent with survivor's guilt in relationship to her surviving in him dying from this incident. The patient's boyfriend had recently gotten out of jail.   The patient is now having to deal with discharge planning. The patient is very motivated to try to get discharged as soon as possible. At some levels this is a good aspect her recovery but the patient is still clearly having some difficulties from her hypoxic event.   Interventions Strategy:  Cognitive/behavioral psychotherapeutic interventions and building coping skills and strategies around dealing with the acute events.  Participation Level:   Active  Participation Quality:  Appropriate and Drowsy      Behavioral Observation:  Well Groomed, Drowsy and Lethargic, and Depressed.   Current Psychosocial Factors: The patient is still dealing with psychosocial issues around the events of her overdose. The patient reports that she watched her boyfriend's funeral that he been recorded input on Facebook. We talked some about the potential complications and difficulties of her sustaining time looking at [employer] while she is in the hospital and the clear  potential for problems from communicating with other people about what has  transpired and interacting in ways that may magnify her feelings of guilt and depression.  Content of Session:   Reviewed current symptoms and continue to work on building coping skills and strategies.  Current Status:   The patient is continuing to deal with adjustment difficulties and depressive types of symptoms. We talked about follow-up care is far as her substance abuse therapeutic interventions. The patient seemed hesitant to try to return to [facility] stating that while they were helpful at the beginning she did not think there very helpful towards the end of her care. However, they are someone who knows her and his already treated her but at the very least they may be a practice that could help her find another referral source. I talked with the clinical social worker about this possibility.  Patient Progress:   Stable  Target Goals:   Working on follow-up goals as far as her care regarding her history of opiate abuse and addiction.  L Impression/Diagnosis:   The patient is a long history of substance abuse which is primarily related to opiates and heroin use. The patient reports that she had been through many years of counseling for substance abuse and other issues related to conflicts that have been happening within her family through the years. She reports that she was clean from August 2017 until 1 March. Her boyfriend recently was released from jail and they ended up acquiring some heroin when and snorted heroin together. It is possible that this heroin was laced with other more powerful opiates.  A major issues is bereavement around the death of her long-time boyfriend and the long standing issues with her and her family.  The patient is still having issues coping with the death of her boyfriend and her relationship to his death.      Diagnosis:   Severe hypoxic-ischemic encephalopathy - Plan: Ambulatory referral to Physical Medicine Rehab     Opioid overdose

## 2016-05-08 ENCOUNTER — Inpatient Hospital Stay (HOSPITAL_COMMUNITY): Payer: 59 | Admitting: Physical Therapy

## 2016-05-08 ENCOUNTER — Inpatient Hospital Stay (HOSPITAL_COMMUNITY): Payer: 59 | Admitting: Speech Pathology

## 2016-05-08 ENCOUNTER — Inpatient Hospital Stay (HOSPITAL_COMMUNITY): Payer: 59 | Admitting: Occupational Therapy

## 2016-05-08 LAB — CBC
HCT: 40.1 % (ref 36.0–46.0)
Hemoglobin: 13.1 g/dL (ref 12.0–15.0)
MCH: 29.2 pg (ref 26.0–34.0)
MCHC: 32.7 g/dL (ref 30.0–36.0)
MCV: 89.5 fL (ref 78.0–100.0)
PLATELETS: 598 10*3/uL — AB (ref 150–400)
RBC: 4.48 MIL/uL (ref 3.87–5.11)
RDW: 12.7 % (ref 11.5–15.5)
WBC: 7.4 10*3/uL (ref 4.0–10.5)

## 2016-05-08 MED ORDER — NICOTINE 21 MG/24HR TD PT24
21.0000 mg | MEDICATED_PATCH | Freq: Every day | TRANSDERMAL | Status: DC
  Administered 2016-05-08 – 2016-05-09 (×2): 21 mg via TRANSDERMAL
  Filled 2016-05-08 (×2): qty 1

## 2016-05-08 MED ORDER — CLONAZEPAM 0.5 MG PO TABS
1.0000 mg | ORAL_TABLET | Freq: Two times a day (BID) | ORAL | Status: DC
  Administered 2016-05-08 – 2016-05-09 (×2): 1 mg via ORAL
  Filled 2016-05-08 (×2): qty 2

## 2016-05-08 MED ORDER — FLUOXETINE HCL 20 MG PO CAPS
20.0000 mg | ORAL_CAPSULE | Freq: Every day | ORAL | Status: DC
  Administered 2016-05-09: 20 mg via ORAL
  Filled 2016-05-08: qty 1

## 2016-05-08 MED ORDER — QUETIAPINE FUMARATE 25 MG PO TABS: 25.0000 mg | ORAL_TABLET | Freq: Every day | ORAL | Status: DC

## 2016-05-08 NOTE — Progress Notes (Signed)
Physical Therapy Discharge Summary  Patient Details  Name: Jane Watkins MRN: [id number] Date of Birth: [birth year]  Today's Date: 05/08/2016 PT Individual Time: 1500-1530 and 1000-1100 PT Individual Time Calculation (min): 30 min and 60 min (total 90 min)    Patient has met 13 of 13 long term goals due to improved activity tolerance, improved balance, improved postural control, increased strength, decreased pain, ability to compensate for deficits, functional use of  right upper extremity, right lower extremity, left upper extremity and left lower extremity, improved attention and improved awareness.  Patient to discharge at an ambulatory level Supervision.   Patient's care partner is independent to provide the necessary physical and cognitive assistance at discharge.  Reasons goals not met: All goals met  Recommendation:  Patient will benefit from ongoing skilled PT services in outpatient setting to continue to advance safe functional mobility, address ongoing impairments in strength, balance, coordination, activity tolerance, and minimize fall risk.  Equipment: RW  Reasons for discharge: treatment goals met and discharge from hospital  Patient/family agrees with progress made and goals achieved: Yes  PT Discharge Precautions/Restrictions Precautions Precautions: Fall Precaution Comments: LLE buckles Restrictions Weight Bearing Restrictions: No Vital Signs Therapy Vitals Temp: 97.8 F (36.6 C) Temp Source: Oral Pulse Rate: 85 Resp: 17 BP: 109/66 Patient Position (if appropriate): Lying Oxygen Therapy SpO2: 98 % O2 Device: Not Delivered Pain  C/o intermittent pain at L knee, anterolateral joint line Vision/Perception    WFL Cognition Overall Cognitive Status: Impaired/Different from baseline Arousal/Alertness: Awake/alert Orientation Level: Oriented X4 Attention: Sustained Focused Attention: Appears intact Sustained Attention: Appears intact Memory:  Impaired Memory Impairment: Decreased recall of new information Awareness: Impaired Awareness Impairment: Emergent impairment Problem Solving: Impaired Problem Solving Impairment: Verbal complex;Functional complex Behaviors: Impulsive Safety/Judgment: Impaired Sensation Sensation Light Touch: Appears Intact Stereognosis: Not tested Hot/Cold: Not tested Proprioception: Appears Intact Coordination Gross Motor Movements are Fluid and Coordinated: Yes Fine Motor Movements are Fluid and Coordinated: Yes Heel Shin Test: WFL BLE Motor  Motor Motor: Within Functional Limits  Mobility Bed Mobility Bed Mobility: Rolling Right;Rolling Left;Supine to Sit;Sit to Supine Rolling Right: 7: Independent Rolling Left: 7: Independent Supine to Sit: 7: Independent;HOB flat Sit to Supine: 7: Independent;HOB flat Transfers Transfers: Yes Sit to Stand: 5: Supervision;With armrests Sit to Stand Details: Verbal cues for precautions/safety Stand to Sit: 5: Supervision;With armrests Stand to Sit Details (indicate cue type and reason): Verbal cues for precautions/safety Stand Pivot Transfers: 5: Supervision;With armrests Stand Pivot Transfer Details: Verbal cues for precautions/safety Locomotion  Ambulation Ambulation: Yes Ambulation/Gait Assistance: 5: Supervision Ambulation Distance (Feet): 175 Feet Assistive device: Rolling walker Ambulation/Gait Assistance Details: Verbal cues for precautions/safety Gait Gait: Yes Gait Pattern: Impaired Gait Pattern:  (grossly WFL, occasional staggering, LLE buckles occasionally when ambulating without AD) Stairs / Additional Locomotion Stairs: Yes Stairs Assistance: 5: Supervision Stair Management Technique: Forwards;Alternating pattern;One rail Right Number of Stairs: 12 Height of Stairs: 6 Ramp: 5: Supervision Curb: 5: Psychiatric nurse: Yes Wheelchair Assistance: 5: Investment banker, operational Details:  Verbal cues for precautions/safety;Verbal cues for Astronomer: Both upper extremities Wheelchair Parts Management: Supervision/cueing Distance: 150'  Trunk/Postural Assessment  Cervical Assessment Cervical Assessment: Within Functional Limits Thoracic Assessment Thoracic Assessment: Within Functional Limits Lumbar Assessment Lumbar Assessment: Within Functional Limits Postural Control Postural Control: Deficits on evaluation (occasional LLE buckling with poor effort noted to attempt to assist with recovering balance)  Balance Balance Balance Assessed: Yes Standardized Balance Assessment Standardized Balance Assessment: Berg Balance Test;Timed Up and Go  Test Berg Balance Test Sit to Stand: Able to stand without using hands and stabilize independently Standing Unsupported: Able to stand safely 2 minutes Sitting with Back Unsupported but Feet Supported on Floor or Stool: Able to sit safely and securely 2 minutes Stand to Sit: Sits safely with minimal use of hands Transfers: Able to transfer safely, minor use of hands Standing Unsupported with Eyes Closed: Able to stand 10 seconds safely Standing Ubsupported with Feet Together: Able to place feet together independently and stand 1 minute safely From Standing, Reach Forward with Outstretched Arm: Can reach confidently >25 cm (10") From Standing Position, Pick up Object from Floor: Able to pick up shoe, needs supervision From Standing Position, Turn to Look Behind Over each Shoulder: Looks behind from both sides and weight shifts well Turn 360 Degrees: Able to turn 360 degrees safely in 4 seconds or less Standing Unsupported, Alternately Place Feet on Step/Stool: Able to stand independently and safely and complete 8 steps in 20 seconds Standing Unsupported, One Foot in Front: Able to place foot tandem independently and hold 30 seconds Standing on One Leg: Able to lift leg independently and hold 5-10 seconds Total  Score: 54 Timed Up and Go Test TUG: Normal TUG Normal TUG (seconds): 15 Static Sitting Balance Static Sitting - Balance Support: Feet supported;No upper extremity supported Static Sitting - Level of Assistance: 7: Independent Dynamic Sitting Balance Dynamic Sitting - Balance Support: Feet supported;No upper extremity supported Dynamic Sitting - Level of Assistance: 6: Modified independent (Device/Increase time) Dynamic Sitting - Balance Activities: Forward lean/weight shifting;Lateral lean/weight shifting;Reaching for objects;Reaching across midline Static Standing Balance Static Standing - Balance Support: During functional activity Static Standing - Level of Assistance: 5: Stand by assistance Dynamic Standing Balance Dynamic Standing - Balance Support: No upper extremity supported Dynamic Standing - Level of Assistance: 5: Stand by assistance Dynamic Standing - Balance Activities: Lateral lean/weight shifting;Forward lean/weight shifting;Reaching for objects;Reaching across midline Extremity Assessment  RUE Assessment RUE Assessment: Within Functional Limits LUE Assessment LUE Assessment: Within Functional Limits RLE Assessment RLE Assessment: Within Functional Limits (grossly 4+/5 throughout) LLE Assessment LLE Assessment: Exceptions to [facility] (grossly 4/5 to 4+/5 throughout; demonstrates 1/5 quad activation in MMT however question effort d/t observed strength in functional activities.)  Skilled Therapeutic Intervention: Tx 1: Pt received seated on EOB; denies pain and agreeable to treatment. Gait throughout session with RW and S; no LOB or buckling noted when using RW. Assessed all mobility as described above with S overall and RW. Performed nustep x8 min with BLE for strengthening and endurance. When performing bed mobility in ADL apartment, pt lies down in bed and reports "My left knee won't go straight so I just have to leave it bent when I lay down", despite therapist noting full  knee extension ROM in gait. Instructed pt in [place] A exercises for continued balance/strengthening at home. When attempting to perform long arc quad, pt able to perform with RLE, unable on LLE. Pt reports "It feels like my knee is twisting weird" and poor activation of quads noted despite >3/5 strength functionally during gait, sit <>stands, mini-squats in standing. Reduced activity to short arc quad, with pt still unable to perform normal ROM. Reduced again to quad set; minimal contraction noted. Question poor effort or psych involvement. Cued pt in straight leg raise in supine; again unable to perform unsure due to strength deficits or psych/cog involvement; therapist performed PROM into straight leg raise and pt able to demonstrate eccentric control back down to table  while maintaining full knee extension and quad activation. Will require continued follow up as outpatient to determine cause of impairments. Returned to room as above; remained seated in bed at end of session, all needs in reach.   Tx 2: Pt received seated in bed, denies pain and agreeable to treatment. Pt ambulates within room S without AD. Gait to/from gym no AD and min guard; one episode of LLE buckling with modA to regain balance and improving RLE stepping to A with recovering. Standing wii balance games for weight shifting, ankle strategy; min guard overall with no episodes of buckling. Reviewed recommendation with pt and friend in room to use RW at home and gradually increase independence from device as she and outpatient therapist determines it is safe. Also encouraged AD use with fatigue or increased pain. Pt with no further questions/concerns regarding d/c at this time. Remained seated in bed at end of session, friend present and all needs in reach.   See Function Navigator for Current Functional Status.  Jane Watkins 05/08/2016, 7:55 AM

## 2016-05-08 NOTE — Progress Notes (Signed)
[facility] PHYSICAL MEDICINE & REHABILITATION     PROGRESS NOTE    Subjective/Complaints: Felt tired yesterday afternoon "because of seroquel"---explained to her that I had decreased it  ROS: pt denies nausea, vomiting, diarrhea, cough, shortness of breath or chest pain     Objective: Vital Signs: Blood pressure 109/66, pulse 85, temperature 97.8 F (36.6 C), temperature source Oral, resp. rate 17, height 5\' 2"  (1.575 m), weight 49.5 kg (109 lb 1.6 oz), last menstrual period 04/19/2016, SpO2 98 %. Dg Swallowing Func-speech Pathology  Result Date: 05/07/2016 Objective Swallowing Evaluation: Type of Study: MBS-Modified Barium Swallow Study Patient Details Name: Jane Watkins MRN: [id number] Date of Birth: 12/28/1995 Today's Date: 05/07/2016 Time: SLP Start Time (ACUTE ONLY): 0917-SLP Stop Time (ACUTE ONLY): 0930 SLP Time Calculation (min) (ACUTE ONLY): 13 min Past Medical History: No past medical history on file. Past Surgical History: Past Surgical History: Procedure Laterality Date . APPENDECTOMY   . MOUTH SURGERY   HPI: 21 y/o F with a history of opiate abuse who was found unresponsive next to her boyfriend (who was deceased). She was intubated 3/10-3/14 but failed extubation due to stridor/swelling and was reintubated 3/14 until trach 3/19.  Subjective: pt alert, calm Assessment / Plan / Recommendation CHL IP CLINICAL IMPRESSIONS 05/07/2016 Clinical Impression Pt with funcitonal oropharyngeal abilities when consuming thin liquids via cup and straw as well as graham cracker and diced peaches with juice. Overall pt with timely initiation and strength of swallow. When consuming multiple sips of thin liquids via straw via straw pt with x1 flash penetration. Cued cough cleared and further consumpiton via straw was not penetrated during remainder of study. Pt with great improvement and recommend regular diet, thin liquids via cup or straw and medicine whole with thin liquids.  SLP Visit Diagnosis  Dysphagia, pharyngeal phase (R13.13) Attention and concentration deficit following -- Frontal lobe and executive function deficit following -- Impact on safety and function Mild aspiration risk   CHL IP TREATMENT RECOMMENDATION 05/07/2016 Treatment Recommendations Therapy as outlined in treatment plan below   Prognosis 05/07/2016 Prognosis for Safe Diet Advancement Good Barriers to Reach Goals -- Barriers/Prognosis Comment -- CHL IP DIET RECOMMENDATION 05/07/2016 SLP Diet Recommendations Regular solids;Thin liquid Liquid Administration via Cup;Straw Medication Administration Whole meds with liquid Compensations Minimize environmental distractions;Slow rate;Small sips/bites Postural Changes Seated upright at 90 degrees   CHL IP OTHER RECOMMENDATIONS 05/07/2016 Recommended Consults -- Oral Care Recommendations Oral care BID Other Recommendations --   CHL IP FOLLOW UP RECOMMENDATIONS 05/01/2016 Follow up Recommendations Inpatient Rehab   CHL IP FREQUENCY AND DURATION 04/30/2016 Speech Therapy Frequency (ACUTE ONLY) min 2x/week Treatment Duration 2 weeks      CHL IP ORAL PHASE 05/07/2016 Oral Phase WFL Oral - Pudding Teaspoon -- Oral - Pudding Cup -- Oral - Honey Teaspoon -- Oral - Honey Cup -- Oral - Nectar Teaspoon -- Oral - Nectar Cup -- Oral - Nectar Straw -- Oral - Thin Teaspoon -- Oral - Thin Cup -- Oral - Thin Straw -- Oral - Puree -- Oral - Mech Soft -- Oral - Regular -- Oral - Multi-Consistency -- Oral - Pill -- Oral Phase - Comment --  CHL IP PHARYNGEAL PHASE 05/07/2016 Pharyngeal Phase WFL Pharyngeal- Pudding Teaspoon -- Pharyngeal -- Pharyngeal- Pudding Cup -- Pharyngeal -- Pharyngeal- Honey Teaspoon -- Pharyngeal -- Pharyngeal- Honey Cup -- Pharyngeal -- Pharyngeal- Nectar Teaspoon -- Pharyngeal -- Pharyngeal- Nectar Cup -- Pharyngeal -- Pharyngeal- Nectar Straw -- Pharyngeal -- Pharyngeal- Thin Teaspoon -- Pharyngeal -- Pharyngeal-  Thin Cup -- Pharyngeal -- Pharyngeal- Thin Straw -- Pharyngeal -- Pharyngeal- Puree  -- Pharyngeal -- Pharyngeal- Mechanical Soft -- Pharyngeal -- Pharyngeal- Regular -- Pharyngeal -- Pharyngeal- Multi-consistency -- Pharyngeal -- Pharyngeal- Pill -- Pharyngeal -- Pharyngeal Comment --  CHL IP CERVICAL ESOPHAGEAL PHASE 05/07/2016 Cervical Esophageal Phase WFL Pudding Teaspoon -- Pudding Cup -- Honey Teaspoon -- Honey Cup -- Nectar Teaspoon -- Nectar Cup -- Nectar Straw -- Thin Teaspoon -- Thin Cup -- Thin Straw -- Puree -- Mechanical Soft -- Regular -- Multi-consistency -- Pill -- Cervical Esophageal Comment -- No flowsheet data found. [name] 05/07/2016, 9:46 AM              No results for input(s): WBC, HGB, HCT, PLT in the last 72 hours. No results for input(s): NA, K, CL, GLUCOSE, BUN, CREATININE, CALCIUM in the last 72 hours.  Invalid input(s): CO CBG (last 3)  No results for input(s): GLUCAP in the last 72 hours.  Wt Readings from Last 3 Encounters:  05/07/16 49.5 kg (109 lb 1.6 oz)  05/01/16 51.6 kg (113 lb 12.1 oz)  02/02/16 52.2 kg (115 lb)    Physical Exam:  HENT.  Head. Normocephalic Eyes. Pupils round and reactive to light Neck. Trach stoma dressed--air leaks with cough Cardiovascular.  RRR   Respiratory. CTA B GI. Soft. Bowel sounds normal. She exhibits no distention Skin. Warm and dry Neurological. Initiates more. More engaging.   Moves all 4's with at least 4- to 4/5 strength. Senses pain in all 4's. No resting tone or clonus seen. Psych: affect more dynamic     Assessment/Plan: 1. Functional and cognitive deficits secondary to anoxic brain injury which require 3+ hours per day of interdisciplinary therapy in a comprehensive inpatient rehab setting. Physiatrist is providing close team supervision and 24 hour management of active medical problems listed below. Physiatrist and rehab team continue to assess barriers to discharge/monitor patient progress toward functional and medical goals.  Function:  Bathing Bathing position Bathing activity did  not occur: Refused Position: Shower  Bathing parts Body parts bathed by patient: Right arm, Left arm, Chest, Abdomen, Right upper leg, Left upper leg, Right lower leg, Left lower leg, Front perineal area, Buttocks Body parts bathed by helper: Back  Bathing assist Assist Level: Supervision or verbal cues      Upper Body Dressing/Undressing Upper body dressing   What is the patient wearing?: Bra, Pull over shirt/dress Bra - Perfomed by patient: Thread/unthread right bra strap, Thread/unthread left bra strap, Hook/unhook bra (pull down sports bra)   Pull over shirt/dress - Perfomed by patient: Thread/unthread right sleeve, Put head through opening, Thread/unthread left sleeve, Pull shirt over trunk   Button up shirt - Perfomed by patient: Thread/unthread right sleeve, Thread/unthread left sleeve, Pull shirt around back Button up shirt - Perfomed by helper: Button/unbutton shirt    Upper body assist Assist Level: Supervision or verbal cues      Lower Body Dressing/Undressing Lower body dressing   What is the patient wearing?: Underwear, Pants, Non-skid slipper socks Underwear - Performed by patient: Thread/unthread right underwear leg, Thread/unthread left underwear leg, Pull underwear up/down Underwear - Performed by helper: Pull underwear up/down Pants- Performed by patient: Thread/unthread right pants leg, Thread/unthread left pants leg, Pull pants up/down Pants- Performed by helper: Thread/unthread left pants leg, Pull pants up/down Non-skid slipper socks- Performed by patient: Don/doff right sock, Don/doff left sock       Shoes - Performed by patient: Don/doff right shoe, Don/doff left  shoe         TED Hose - Performed by helper: Don/doff right TED hose, Don/doff left TED hose  Lower body assist Assist for lower body dressing: Supervision or verbal cues      Toileting Toileting Toileting activity did not occur: No continent bowel/bladder event Toileting steps completed by  patient: Adjust clothing prior to toileting, Performs perineal hygiene, Adjust clothing after toileting Toileting steps completed by helper: Adjust clothing prior to toileting, Performs perineal hygiene, Adjust clothing after toileting    Toileting assist Assist level: Supervision or verbal cues   Transfers Chair/bed transfer Chair/bed transfer activity did not occur: N/A Chair/bed transfer method: Stand pivot Chair/bed transfer assist level: Touching or steadying assistance (Pt > 75%) Chair/bed transfer assistive device: Armrests     Locomotion Ambulation     Max distance: 160 Assist level: Touching or steadying assistance (Pt > 75%)   Wheelchair   Type: Manual Max wheelchair distance: 100 Assist Level: Supervision or verbal cues  Cognition Comprehension Comprehension assist level: Follows basic conversation/direction with no assist  Expression Expression assist level: Expresses basic needs/ideas: With no assist  Social Interaction Social Interaction assist level: Interacts appropriately 75 - 89% of the time - Needs redirection for appropriate language or to initiate interaction.  Problem Solving Problem solving assist level: Solves basic problems with no assist  Memory Memory assist level: Requires cues to use assistive device   Medical Problem List and Plan: 1.  Hypoxic encephalopathy cognitive deficits, balance disorder secondary to drug overdose  -finalize dc planning for tomorrow  - 2.  DVT Prophylaxis/Anticoagulation: ambulatory. Dc lovenox 3. Pain Management: Tylenol as needed 4. Mood: Klonopin qhs, Prozac 30 mg daily, Seroquel decr to 25 qhs 5. Neuropsych: This patient is not capable of making decisions on her own behalf. 6. Skin/Wound Care: Routine skin checks 7. Fluids/Electrolytes/Nutrition: better intake  - recheck in am  8. Tracheostomy 04/28/2016 per Dr. Tyson Alias.   -trach out---occlusive dressing.--stoma closing---pt occluding 9. Dysphagia. Diet upgrade to  regular--good intake  -ivf stopped   10. MRSA PCR screen positive. Contact precautions 11. Polysubstance tobacco and alcohol abuse. Counseling 12.Tachycardia. Patient normal sinus rhythm. No acute distress. Monitor with increased mobility 13.Leukocytosis.WBC up to 11.5k.  Patient remains afebrile. No clinical signs of infection  -recheck labs Friday morning   LOS (Days) 7 A FACE TO FACE EVALUATION WAS PERFORMED  Ranelle Oyster, MD 05/08/2016 9:14 AM

## 2016-05-08 NOTE — Progress Notes (Signed)
Patient unsure of when LBM was.  RN educated patient on maintaining regular bowel emptying pattern and agreed to take sorbitol for constipation.  Dani Gobbleeardon, Nazirah Tri J, RN

## 2016-05-08 NOTE — Progress Notes (Signed)
Speech Language Pathology Discharge Summary  Patient Details  Name: Jane Watkins MRN: [id number] Date of Birth: [birth year]  Today's Date: 05/08/2016 SLP Individual Time: 1125-1210 SLP Individual Time Calculation (min): 45 min   Skilled Therapeutic Interventions:  Skilled treatment session focused on cognition goals. SLP facilitated session by providing question cues for return demonstration of previously learned game. Pt with Mod I when playing game against friend. Pt was emotional when describing addiction to drugs and loss of her boyfriend. Emotional support given. Education completed with step-mother.      Patient has met 8 of 8 long term goals.  Patient to discharge at overall Supervision level.  Reasons goals not met:     Clinical Impression/Discharge Summary:   Pt has made excellent progress in ST and as a result had met her LTGs. Pt is currently at a Min A to supervision level with higher level cognitive tasks. Pt's family will provide needed supervision. She is currently tolerating regular diet with thin liquids without overt s/s of aspiration.  Care Partner:  Caregiver Able to Provide Assistance: Yes  Type of Caregiver Assistance: Cognitive  Recommendation:  24 hour supervision/assistance;Outpatient SLP  Rationale for SLP Follow Up: Maximize functional communication;Maximize cognitive function and independence;Maximize swallowing safety;Reduce caregiver burden   Equipment: N/A   Reasons for discharge: Treatment goals met   Patient/Family Agrees with Progress Made and Goals Achieved: Yes   Function:    Cognition Comprehension Comprehension assist level: Follows complex conversation/direction with extra time/assistive device  Expression   Expression assist level: Expresses complex 90% of the time/cues < 10% of the time  Social Interaction Social Interaction assist level: Interacts appropriately 90% of the time - Needs monitoring or encouragement for participation or  interaction.  Problem Solving Problem solving assist level: Solves complex 90% of the time/cues < 10% of the time  Memory Memory assist level: Requires cues to use assistive device   Roisin Mones B. Rutherford Nail, M.S., CCC-SLP Speech-Language Pathologist  Jane Watkins 05/08/2016, 12:33 PM

## 2016-05-08 NOTE — Progress Notes (Signed)
Occupational Therapy Discharge Summary  Patient Details  Name: Jane Watkins MRN: [id number] Date of Birth: 09-Mar-1995  Today's Date: 05/08/2016 OT Individual Time: 4765-4650 OT Individual Time Calculation (min): 60 min   Grad day: Self care retraining at shower level with VC for RW safety and remembering to use RW throughout session to increase safety due to buckling of knee at times. Pt able to bathe without VC with setup. Pt able to demonstrate functional ambulation without buckling of left knee; requiring steadying A when would forget the walker. Pt able to dress sit to stand without cues but did not dress in a methodical order but was able to self correct without needing cues. Pt's friend present in session - no family. Pt Left at sink to brush teeth.   Patient has met 12 of 12 long term goals due to improved activity tolerance, improved balance, postural control, ability to compensate for deficits, functional use of  RIGHT upper, RIGHT lower, LEFT upper and LEFT lower extremity, improved attention, improved awareness and improved coordination.  Patient to discharge at overall Supervision level.  Patient's care partner is independent to provide the necessary physical and cognitive assistance at discharge.    Reasons goals not met: n/a  Recommendation:  Patient will benefit from ongoing skilled OT services in outpatient setting to continue to advance functional skills in the area of BADL and Reduce care partner burden, IADLs.  Equipment: No equipment provided  Reasons for discharge: treatment goals met and discharge from hospital  Patient/family agrees with progress made and goals achieved: Yes  OT Discharge Precautions/Restrictions  Precautions Precautions: Fall Precaution Comments: LLE buckles Restrictions Weight Bearing Restrictions: No Pain Pain Assessment Pain Assessment: 0-10 Pain Score: 3  Pain Type: Acute pain Pain Location: Knee Pain Orientation: Right;Left Pain  Descriptors / Indicators: Aching;Sore Pain Onset: Gradual Patients Stated Pain Goal: 2 Pain Intervention(s): Repositioned ADL ADL ADL Comments: see functional navigator Vision/Perception  Vision- History Baseline Vision/History: No visual deficits Patient Visual Report: No change from baseline Vision- Assessment Vision Assessment?: No apparent visual deficits  Cognition Overall Cognitive Status: Impaired/Different from baseline Arousal/Alertness: Lethargic (at times) Orientation Level: Oriented X4 Attention: Sustained;Focused Focused Attention: Appears intact Sustained Attention: Appears intact Memory: Impaired Memory Impairment: Decreased recall of new information Awareness: Impaired Awareness Impairment: Emergent impairment Problem Solving: Impaired Problem Solving Impairment: Functional basic Behaviors: Impulsive Safety/Judgment: Impaired Sensation Sensation Light Touch: Appears Intact Stereognosis: Not tested Hot/Cold: Appears Intact Proprioception: Appears Intact Coordination Gross Motor Movements are Fluid and Coordinated: Yes Fine Motor Movements are Fluid and Coordinated: Yes Coordination and Movement Description: poor reactions timing with LOB and ability to self correct Heel Shin Test: [facility] [place] BLE Motor  Motor Motor: Within Functional Limits Mobility  Bed Mobility Bed Mobility: Rolling Right;Rolling Left;Supine to Sit;Sit to Supine Rolling Right: 7: Independent Rolling Left: 7: Independent Supine to Sit: 7: Independent;HOB flat Sit to Supine: 7: Independent;HOB flat Transfers Sit to Stand: 5: Supervision;With armrests Sit to Stand Details: Verbal cues for precautions/safety Stand to Sit: 5: Supervision;With armrests Stand to Sit Details (indicate cue type and reason): Verbal cues for precautions/safety  Trunk/Postural Assessment  Cervical Assessment Cervical Assessment: Within Functional Limits Thoracic Assessment Thoracic Assessment: Within Functional  Limits Lumbar Assessment Lumbar Assessment: Within Functional Limits Postural Control Postural Control: Deficits on evaluation Righting Reactions: delayed  Balance Balance Balance Assessed: Yes Static Sitting Balance Static Sitting - Balance Support: Feet supported;No upper extremity supported Static Sitting - Level of Assistance: 7: Independent Dynamic Sitting Balance Dynamic Sitting - Balance  Support: Feet supported;No upper extremity supported Dynamic Sitting - Level of Assistance: 6: Modified independent (Device/Increase time) Dynamic Sitting - Balance Activities: Forward lean/weight shifting;Lateral lean/weight shifting;Reaching for objects;Reaching across midline Static Standing Balance Static Standing - Balance Support: During functional activity Static Standing - Level of Assistance: 5: Stand by assistance Dynamic Standing Balance Dynamic Standing - Balance Support: No upper extremity supported Dynamic Standing - Level of Assistance: 5: Stand by assistance Dynamic Standing - Balance Activities: Lateral lean/weight shifting;Forward lean/weight shifting;Reaching for objects;Reaching across midline Extremity/Trunk Assessment RUE Assessment RUE Assessment: Within Functional Limits LUE Assessment LUE Assessment: Within Functional Limits   See Function Navigator for Current Functional Status.  Willeen Cass [facility] 05/08/2016, 10:11 AM

## 2016-05-08 NOTE — Discharge Summary (Signed)
Discharge summary job # [id number]

## 2016-05-08 NOTE — Discharge Summary (Signed)
NAMECain Watkins NO.:  [id number]  MEDICAL RECORD NO.:  [id number]  LOCATION:                                 FACILITY:  PHYSICIAN:  Ranelle Oyster, M.D.DATE OF BIRTH:  [birth year]  DATE OF ADMISSION:  05/01/2016 DATE OF DISCHARGE:  05/09/2016                              DISCHARGE SUMMARY   DISCHARGE DIAGNOSES: 1. 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 2. Subcutaneous Lovenox for deep vein thrombosis prophylaxis. 3. Anxiety. 4. Tracheostomy April 28, 2016-decannulated. 5. Dysphagia. 6. Methicillin resistant Staphylococcus aureus PCR screening positive. 7. Polysubstance, tobacco, alcohol abuse. 8. Tachycardia, resolved.  HISTORY OF PRESENT ILLNESS:  This is a 21 year old right-handed female with history of opiate abuse as well as tobacco abuse.  Lived alone, independent prior to admission.  She plans discharge to home with her father and stepmother.  Presented April 19, 2016, after being found next to her boyfriend unresponsive.  Her boyfriend was deceased.  She was brought initially to [facility] combative requiring intubation. CK of 15,000, mild lactic acidosis.  Mildly elevated troponin, felt to be related to demand ischemia.  Cranial CT scan negative.  Follow up MRI unremarkable.  Urine drug screen positive for opiates.  EEG with severe nonspecific diffuse cerebral dysfunction, no seizure activity. Echocardiogram with ejection fraction of 70%, no wall motion abnormalities.  Maintained on ventilatory support, ultimately with tracheostomy tube April 28, 2016.  Maintained on a dysphagia #1 honey thick liquid diet.  MRSA PCR screening positive with contact precautions.  Urine culture, greater than 100,000 coag-negative staph. She did complete a course of vancomycin.  Mild tachycardia went up with therapies and monitored.  Subcutaneous Lovenox for DVT prophylaxis. Physical and occupational therapy  ongoing.  The patient was admitted for comprehensive rehab program.  PAST MEDICAL HISTORY:  See discharge diagnoses.  SOCIAL HISTORY:  Independent, lived alone prior to admission.  Plan is to be discharged home with her father and stepmother.  Functional status upon admission to rehab services, min-to-mod assist overall for functional mobility min-to-mod assist activities of daily living with cuing.  PHYSICAL EXAMINATION:  VITAL SIGNS:  Blood pressure 120/70, pulse 118, temperature 98, respirations 18. GENERAL:  This was an alert female, she made eye contact with examiner. Inconsistent to 1 step commands.  She did mouth some words. Tracheostomy tube in place. HEENT:  Pupils round and reactive to light. LUNGS:  Clear to auscultation without wheeze. CARDIAC:  Regular rate and rhythm.  No murmur. ABDOMEN:  Soft, nontender.  Good bowel sounds.  She was moving all extremities.  REHABILITATION HOSPITAL COURSE:  The patient was admitted to Inpatient Rehab Services with therapies initiated on a 3-hour daily basis, consisting of physical therapy, occupational therapy, speech therapy, and rehabilitation nursing.  The following issues were addressed during the patient's rehabilitation stay.  Pertaining to [name]'s hypoxic encephalopathy due to drug overdose remained stable.  She continued to participate fully with her therapy programs.  She received followup by Neuropsychology in regard to her hypoxic event noting cognitive behavioral deficits.  All issues were discussed with the patient on the death of her boyfriend and symptoms consistent with  survivor guilt.  She was dealing with adjustment difficulties and depressive-type symptoms. She would continue to receive ongoing follow up.  She was maintained on Klonopin for anxiety as well as Seroquel.  She was sleeping much better at night.  Her tracheostomy tube has since been removed, her diet advanced to regular.  She continued on  contact precautions for MRSA PCR screening positive.  In regard to her long history of tobacco and alcohol abuse, all issues were discussed.  The need to cessation of any illicit drug products and issues again discussed with family.  Await plan on seeking further drug rehab.  The patient received weekly collaborative interdisciplinary team conferences to discuss estimated length of stay, family teaching, any barriers to discharge.  She was ambulating extended distances with a rolling walker, working with standing balance, monitoring of safety and awareness as this was ongoing with discussion of family.  She could gather her belongings for activities of daily living and homemaking, ambulate to the bathroom, performing cloth management, hygiene, toileting, and overall supervision.  She would ask appropriate question, sustained attention for approximately 15 minutes.  She was discharged to home with family and recommendations of ongoing supervision.  DISCHARGE MEDICATIONS: 1. Klonopin 2 mg p.o. b.i.d. and 0.5 mg at bedtime. 2. Prozac 30 mg p.o. daily. 3. Multivitamin daily. 4. Seroquel 25 mg p.o. b.i.d. 5. Tylenol as needed. 6. She had been on Lovenox for DVT prophylaxis throughout her rehab     course.  This was discontinued at the time of discharge as the     patient was ambulatory.  FOLLOWUP:  She would follow up with Dr. Faith RogueZachary Swartz at the outpatient rehab service office as directed; Dr. Rory Percyaniel Feinstein, Pulmonary Services, call for appointment as needed.  SPECIAL INSTRUCTIONS:  No driving.  No smoking.  No alcohol.  No illicit drug products.     Mariam Dollaraniel Angiulli, P.A.   ______________________________ Ranelle OysterZachary T. Swartz, M.D.    DA/MEDQ  D:  05/08/2016  T:  05/08/2016  Job:  [id number]  cc:   Ranelle OysterZachary T. Swartz, M.D. Trauma Services Nelda Bucksaniel J Feinstein, MD

## 2016-05-08 NOTE — Progress Notes (Signed)
Pt complains of lf knee and lower leg pain. She reports that she was outside walking with the physical therapist and she fell to her knees on the concrete.she also reports falling in her room today to her knees while working with physical therapy. Pt has no signs of injury. No swelling or bruising. Pt reports pain in her left knee and shooting pain down left leg. It is to early for  administration of tylenol. Pt informed. I mentioned to call the medical doctor to have an X-ray of the area. Pt stated that she did not think it was broken. I had her straighten her leg and lay on her back. She has been laying on that leg while sleeping.pt agreed to wait until 1230 when she could have the tylenol and in the meantime keep her leg straight and lay on her back.

## 2016-05-09 LAB — BASIC METABOLIC PANEL
ANION GAP: 12 (ref 5–15)
BUN: 13 mg/dL (ref 6–20)
CALCIUM: 9.9 mg/dL (ref 8.9–10.3)
CO2: 24 mmol/L (ref 22–32)
Chloride: 104 mmol/L (ref 101–111)
Creatinine, Ser: 0.6 mg/dL (ref 0.44–1.00)
GFR calc Af Amer: >60 mL/min (ref >60)
GLUCOSE: 91 mg/dL (ref 65–99)
Potassium: 4 mmol/L (ref 3.5–5.1)
Sodium: 140 mmol/L (ref 135–145)

## 2016-05-09 MED ORDER — ADULT MULTIVITAMIN W/MINERALS CH: 1.0000 | ORAL_TABLET | Freq: Every day | ORAL | Status: DC

## 2016-05-09 MED ORDER — FLUOXETINE HCL 20 MG PO CAPS: 20.0000 mg | ORAL_CAPSULE | Freq: Every day | ORAL | 3 refills | Status: DC

## 2016-05-09 MED ORDER — CLONAZEPAM 1 MG PO TABS: 1.0000 mg | ORAL_TABLET | Freq: Two times a day (BID) | ORAL | 0 refills | Status: DC

## 2016-05-09 MED ORDER — QUETIAPINE FUMARATE 25 MG PO TABS: 25.0000 mg | ORAL_TABLET | Freq: Every day | ORAL | 0 refills | Status: DC

## 2016-05-09 MED ORDER — NICOTINE 21 MG/24HR TD PT24: MEDICATED_PATCH | TRANSDERMAL | 0 refills | Status: DC

## 2016-05-09 NOTE — Discharge Instructions (Signed)
Inpatient Rehab Discharge Instructions  Jane Watkins Discharge date and time: No discharge date for patient encounter.   Activities/Precautions/ Functional Status: Activity: activity as tolerated Diet:  Wound Care: keep wound clean and dry Functional status:  ___ No restrictions     ___ Walk up steps independently ___ 24/7 supervision/assistance   ___ Walk up steps with assistance ___ Intermittent supervision/assistance  ___ Bathe/dress independently ___ Walk with walker     _x__ Bathe/dress with assistance ___ Walk Independently    ___ Shower independently ___ Walk with assistance    ___ Shower with assistance ___ No alcohol     ___ Return to work/school ________     COMMUNITY REFERRALS UPON DISCHARGE:    Outpatient: PT     OT    ST                   Agency:  [facility]      Phone: [phone]                Appointment Date/Time:  4/2 @ 11:45 am (please arrive at 11:15 ) for PT                                                                   4/3 @ 2:00 pm for OT                                                                         @ 3:00 pm for Speech  Medical Equipment/Items Ordered: rolling walker                                                     Agency/Supplier:  [facility] @ [phone]   GENERAL COMMUNITY RESOURCES FOR PATIENT/FAMILY:  Support Groups: (handout)   Mental Health:  (handout)       Special Instructions: NO DRIVING SMOKING ALCOHOL   My questions have been answered and I understand these instructions. I will adhere to these goals and the provided educational materials after my discharge from the hospital.  Patient/Caregiver Signature _______________________________ Date __________  Clinician Signature _______________________________________ Date __________  Please bring this form and your medication list with you to all your follow-up doctor's appointments.

## 2016-05-09 NOTE — Progress Notes (Signed)
Patient discharged home.  Left floor ambulating via walker, escorted by family and nursing staff.  Patient and family verbalized understanding of discharge instructions as given by Deatra Ina, PA.  All patient belongings sent with patient, including DME and prescriptions.  Appears to be in no immediate distress at this time.  Dani Gobble, RN

## 2016-05-09 NOTE — Progress Notes (Signed)
Social Work  Discharge Note  The overall goal for the admission was met for:   Discharge location: Yes - home with parents, friends providing 24/7 supervision  Length of Stay: Yes - 8 days  Discharge activity level: Yes - supervision  Home/community participation: Yes  Services provided included: MD, RD, PT, OT, SLP, RN, TR, Pharmacy, Neuropsych and SW  Financial Services: Private Insurance: [place]  Follow-up services arranged: Outpatient: PT, OT, ST via [facility], DME: rolling walker via [facility] and Patient/Family has no preference for HH/DME agencies  Comments (or additional information): Met with pt and mother to review recommendations per neuropsychology that 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  Pt very agreeable with this plan.  Reviewed program options with both including [facility] and [facility].  Pt and her family plan to contact these programs and discuss admission, program details, etc.  Patient/Family verbalized understanding of follow-up arrangements: Yes  Individual responsible for coordination of the follow-up plan: pt  Confirmed correct DME delivered: Radley Teston 05/09/2016    Nicholous Girgenti

## 2016-05-09 NOTE — Progress Notes (Signed)
Pt requested something to help her sleep. She stated that she wanted what they offered her yesterday, Seroquel.I explained to the patient what she had ordered at the present time and what she could have PRN. Pt stated that she did not want any of that. She stated that she just wanted something to help her sleep.I explained to her that the information would be passed on to the physician that she was unable to sleep and she wanted something to help. Pt. Did not complain of pain in her knee at this time. I did ask if she needed the Tylenol that she requested earlier. She stated that she did not. The CNA found that the bed alarm was off and the patient reported that she had been up to the bathroom by herself several times. She reported that she had a bowel movement and that she urinated several times.

## 2016-05-12 ENCOUNTER — Ambulatory Visit: Payer: 59 | Attending: Physical Medicine & Rehabilitation | Admitting: Rehabilitation

## 2016-05-12 ENCOUNTER — Encounter: Payer: Self-pay | Admitting: Rehabilitation

## 2016-05-12 DIAGNOSIS — M6281 Muscle weakness (generalized): Secondary | ICD-10-CM | POA: Diagnosis present

## 2016-05-12 DIAGNOSIS — R41844 Frontal lobe and executive function deficit: Secondary | ICD-10-CM | POA: Diagnosis present

## 2016-05-12 DIAGNOSIS — R2681 Unsteadiness on feet: Secondary | ICD-10-CM | POA: Diagnosis not present

## 2016-05-12 DIAGNOSIS — R41841 Cognitive communication deficit: Secondary | ICD-10-CM | POA: Insufficient documentation

## 2016-05-12 DIAGNOSIS — R2689 Other abnormalities of gait and mobility: Secondary | ICD-10-CM | POA: Diagnosis present

## 2016-05-12 NOTE — Therapy (Signed)
[facility] [address] [place], [place], [address] Phone: [phone]   Fax:  [phone]  Physical Therapy Evaluation  Patient Details  Name: Jane Watkins MRN: [id number] Date of Birth: Jun 19, 1995 Referring Provider: Faith Rogue, MD  Encounter Date: 05/12/2016      PT End of Session - 05/12/16 1415    Visit Number 1   Number of Visits 13   Date for PT Re-Evaluation 06/26/16   Authorization Type [employer], 60 visit limit per discipline   Authorization - Visit Number 1   Authorization - Number of Visits 60   PT Start Time 1147   PT Stop Time 1230   PT Time Calculation (min) 43 min   Equipment Utilized During Treatment Gait belt   Activity Tolerance Patient tolerated treatment well   Behavior During Therapy Flat affect      History reviewed. No pertinent past medical history.  Past Surgical History:  Procedure Laterality Date  . APPENDECTOMY    . MOUTH SURGERY      There were no vitals filed for this visit.       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  Note that her boyfriend at the time also overdosed and passed away (she is seeing OP psych for this).  She would like to work on her left leg strength, balance    Limitations House hold activities;Walking   Patient Stated Goals "I want to not use this walker in two weeks and I want to strengthen my left leg so I can move around."     Currently in Pain? Yes   Pain Score 3    Pain Location Knee   Pain Orientation Left   Pain Descriptors / Indicators Aching   Pain Type Acute pain   Pain Onset 1 to 4 weeks ago   Pain Frequency Intermittent   Aggravating Factors  walking   Pain Relieving Factors Aleve, ice, elevation            [facility] PT Assessment - 05/12/16 1156      Assessment   Medical Diagnosis hypoxic encephalopathy   Referring Provider Faith Rogue, MD   Onset  Date/Surgical Date 04/19/16   Prior Therapy acute, IP rehab (from 3/22-3/30)     Precautions   Precautions Fall   Precaution Comments Has healing trach site, L knee buckles at times when has no AD     Restrictions   Weight Bearing Restrictions No     Balance Screen   Has the patient fallen in the past 6 months Yes   How many times? 1   Has the patient had a decrease in activity level because of a fear of falling?  No   Is the patient reluctant to leave their home because of a fear of falling?  No     Home Nurse, mental health Private residence   Living Arrangements Parent   Available Help at Discharge Family;Friend(s);Available 24 hours/day   Type of Home Apartment   Home Access Stairs to enter   Entrance Stairs-Number of Steps 1 small step   Entrance Stairs-Rails None   Home Layout One level   Home Equipment Walker - 2 wheels;Shower seat     Prior Function   Level of Independence Independent   Vocation Full time employment   Engineer, technical sales at [employer] Going to parks, walking, being outside  Cognition   Overall Cognitive Status Impaired/Different from baseline     Sensation   Light Touch Appears Intact   Hot/Cold Appears Intact   Proprioception Appears Intact     Coordination   Gross Motor Movements are Fluid and Coordinated Yes  in LEs   Fine Motor Movements are Fluid and Coordinated Yes  in LEs   Heel Shin Test [facility] BLE     ROM / Strength   AROM / PROM / Strength Strength     Strength   Overall Strength Deficits   Overall Strength Comments grossly WFL, however do note weakness in R and L hips (3+/5 in sitting), L knee ext in sitting pt demonstrates 1/5 strength.  Tried to have pt do SLR in supine, however she was unable to perform, but note that functional quad strength seems to be at least 3/5     Transfers   Transfers Sit to Stand;Stand to Sit   Sit to Stand 7: Independent   Five time sit to stand comments   14.60 without UE support   Stand to Sit 7: Independent     Ambulation/Gait   Ambulation/Gait Yes   Ambulation/Gait Assistance 6: Modified independent (Device/Increase time);5: Supervision;4: Min guard   Ambulation/Gait Assistance Details Pt mod I with use of RW, S to min/guard without AD due to noted L knee buckling at times in hospital.  She did not demonstrate that with me today, but did provide min/guard with gait belt for safety.     Ambulation Distance (Feet) 350 Feet   Assistive device Rolling walker;None   Gait Pattern Step-through pattern;Decreased stride length;Lateral hip instability;Trunk flexed  keeps BUEs locked into extension when walking with RW   Ambulation Surface Level;Indoor   Gait velocity 2.64 ft/sec with RW, 2.41 ft/sec without AD w/ min/guard A   Stairs Yes   Stairs Assistance 6: Modified independent (Device/Increase time)   Stair Management Technique Two rails;Step to pattern;Forwards   Number of Stairs 4   Height of Stairs 6     Standardized Balance Assessment   Standardized Balance Assessment Dynamic Gait Index     Dynamic Gait Index   Level Surface Mild Impairment   Change in Gait Speed Mild Impairment   Gait with Horizontal Head Turns Mild Impairment   Gait with Vertical Head Turns Mild Impairment   Gait and Pivot Turn Mild Impairment   Step Over Obstacle Mild Impairment   Step Around Obstacles Normal   Steps Moderate Impairment   Total Score 16   DGI comment: Scores of 19 or less are predictive of falls in older community living adults  note that PT did provide min/guard with gait belt                           PT Education - 05/12/16 1413    Education provided Yes   Education Details education on evaluation findings, beginning community gait with S and RW, continuing to ambulate in apt with S and without AD   Person(s) Educated Patient   Methods Explanation   Comprehension Verbalized understanding          PT Short Term  Goals - 05/12/16 1422      PT SHORT TERM GOAL #1   Title Pt will be indpendent with initial HEP in order to indicate improved strength and decreased fall risk.  (Target Date: 06/02/16)   Time 3   Period Weeks   Status New  PT SHORT TERM GOAL #2   Title Pt will improve DGI to 19/24 in order to indicate decreased fall risk.     Time 3   Period Weeks   Status New     PT SHORT TERM GOAL #3   Title Pt will ambulate up to 300' over indoor surfaces (carpet, thresholds) without AD at mod I level in order to indicate safe home negotiation and increased independence.     Time 3   Period Weeks   Status New     PT SHORT TERM GOAL #4   Title Pt will negotiate up/down 4 stairs with single rail in alternating fashion at mod I level in order to indicate improved functional strength.    Time 3   Period Weeks   Status New     PT SHORT TERM GOAL #5   Title Pt will ambulate up to 300' over unlevel paved surfaces at S level without AD in order to indicate improved independence with community negotiation.    Time 3   Period Weeks   Status New           PT Long Term Goals - 05/12/16 1425      PT LONG TERM GOAL #1   Title Pt will be independent with final HEP in order to indicate improved strength and decreased fall risk.  (Target Date: 06/23/16)   Time 6   Period Weeks   Status New     PT LONG TERM GOAL #2   Title Pt will improve DGI to 22/24 in order to indicate decreased fall risk.    Time 6   Period Weeks   Status New     PT LONG TERM GOAL #3   Title Pt will perform gait up to 1000' over varying outdoor surfaces (grass, gravel, curb, etc) without AD at mod I level in order to indicate safe community/leisure negotiation.     Time 6   Period Weeks   Status New     PT LONG TERM GOAL #4   Title Pt will negotiate up/down 8 steps without rail in alternating fashion in order to indicate improved functional strength and independence with community access.     Time 6   Period Weeks    Status New     PT LONG TERM GOAL #5   Title Pt will ambulate with gait speed >2.62 ft/sec without AD in order to indicate pt is safe community ambulator.    Time 6   Period Weeks   Status New     Additional Long Term Goals   Additional Long Term Goals Yes     PT LONG TERM GOAL #6   Title Pt will perform work simulated tasks (dual tasking) at S level in order to indicate beginning to return to work.     Time 6   Period Weeks   Status New               Plan - 05/12/16 1416    Clinical Impression Statement Pt presents s/p hypoxic encephalopathy following heroin and opioid drug overdose with hospital admission on 04/19/16, with CIR stay from 3/22-3/30.  Note that her boyfriend also overdosed with her but passed away.  She is currently seeking OP psych services to address coping with this.   Note that she is unable to return to work or live on her own due to physical and cognitive impairments.  Upon PT evaluation, note pt with relatively good functional strength with 5TSS  of 14.60 without UE support, however when testing pts L quadricep strength, she exhibits 1/5 strength.  Unsure if this is cognitive/psych related.  Her walking speed is decreased for community ambulator at 2.41 ft/sec without AD, 2.64 ft/sec with RW.  Feel that she is close to being able to ambulate without RW as her L knee did not buckle during PT evaluation.  She also scored 16/24 on DGI, indicative of increased fall risk.  Pt is of evolving presentation and moderate complexity.   Pt will benefit from skilled OP neuro PT in order to address deficits.     Rehab Potential Excellent   Clinical Impairments Affecting Rehab Potential unsure of severity of cognitive/psych deficits   PT Frequency 2x / week   PT Duration 6 weeks   PT Treatment/Interventions ADLs/Self Care Home Management;DME Instruction;Gait training;Stair training;Functional mobility training;Therapeutic activities;Therapeutic exercise;Balance  training;Neuromuscular re-education;Cognitive remediation;Patient/family education;Vestibular;Energy conservation   PT Next Visit Plan Est HEP for LLE strengthening (BLE), balance, assess gait with  RW vs SPC over outdoor surfaces (have gait belt as L knee has known to buckle), balance strategies   Consulted and Agree with Plan of Care Patient      Patient will benefit from skilled therapeutic intervention in order to improve the following deficits and impairments:  Decreased activity tolerance, Decreased balance, Decreased cognition, Decreased endurance, Decreased knowledge of use of DME, Decreased mobility, Decreased strength, Impaired perceived functional ability, Postural dysfunction  Visit Diagnosis: Unsteadiness on feet - Plan: PT plan of care cert/re-cert  Muscle weakness (generalized) - Plan: PT plan of care cert/re-cert  Other abnormalities of gait and mobility - Plan: PT plan of care cert/re-cert     Problem List Patient Active Problem List   Diagnosis Date Noted  . TBI (traumatic brain injury) (HCC) 05/01/2016  . 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 Respiratory distress   . [place] coma scale total score 3-8 (HCC)   . Pneumonia of both lower lobes due to methicillin resistant Staphylococcus aureus (MRSA) (HCC)   . Acute pulmonary edema (HCC)   . Non-traumatic rhabdomyolysis   . Opioid abuse   . Elevated troponin   . Acute respiratory failure with hypoxia (HCC)   . Drug overdose   . Elevated liver enzymes   . Encephalopathy acute   . Encephalopathy 04/19/2016  . Opiate overdose 04/19/2016    Harriet Butte, PT, MPT [facility] [address] [place], [place], [address] Phone: [phone]   Fax:  [phone] 05/12/16, 2:33 PM  Name: Jane Watkins MRN: [id number] Date of Birth: 02-11-1996

## 2016-05-13 ENCOUNTER — Ambulatory Visit: Payer: 59 | Admitting: Occupational Therapy

## 2016-05-13 ENCOUNTER — Ambulatory Visit: Payer: 59

## 2016-05-13 ENCOUNTER — Encounter: Payer: Self-pay | Admitting: Occupational Therapy

## 2016-05-13 DIAGNOSIS — R2681 Unsteadiness on feet: Secondary | ICD-10-CM | POA: Diagnosis not present

## 2016-05-13 DIAGNOSIS — R41844 Frontal lobe and executive function deficit: Secondary | ICD-10-CM

## 2016-05-13 DIAGNOSIS — R41841 Cognitive communication deficit: Secondary | ICD-10-CM

## 2016-05-13 DIAGNOSIS — M6281 Muscle weakness (generalized): Secondary | ICD-10-CM

## 2016-05-13 NOTE — Patient Instructions (Signed)
Try doing some change calculations and improving your attention for listening and thinking about things at the same time

## 2016-05-13 NOTE — Therapy (Signed)
[facility] [address] [place], [place], [address] Phone: [phone]   Fax:  [phone]  Occupational Therapy Evaluation  Patient Details  Name: Jane Watkins MRN: [id number] Date of Birth: 1995/11/14 Referring Provider: Dr. [name]  Encounter Date: 05/13/2016      OT End of Session - 05/13/16 1635    Visit Number 1   Number of Visits 2   Date for OT Re-Evaluation 06/10/16   Authorization Type [employer] - 60 visit limit per discipline no auth required.    OT Start Time 1406   OT Stop Time 1443   OT Time Calculation (min) 37 min   Activity Tolerance Patient tolerated treatment well      History reviewed. No pertinent past medical history.  Past Surgical History:  Procedure Laterality Date  . APPENDECTOMY    . MOUTH SURGERY      There were no vitals filed for this visit.      Subjective Assessment - 05/13/16 1408    Subjective  I don't have it now but I get pain in my left knee - aleve helps it   Pertinent History see epic; hypoxic encephalopathy due to heroin overdose (pt found unresponsive in bed next to boyfriend who was deceased.   Patient Stated Goals I am not really sure what I want from OT but I want to gain weight back, be able to walk without the walker, and really just be in a better healthier state.    Currently in Pain? No/denies           [facility] OT Assessment - 05/13/16 0001      Assessment   Diagnosis hypoxic encephalopathy due to heroin overdose   Referring Provider Dr. [name]   Onset Date 04/19/16   Prior Therapy inpt rehab and had all three therapies     Precautions   Precautions Fall   Precaution Comments has healing trach site, L knee can buckle sometimes if pt has no AD     Restrictions   Weight Bearing Restrictions No     Balance Screen   Has the patient fallen in the past 6 months Yes  fell getting off the couch not wearing no slip socks     Home  Environment    Family/patient expects to be discharged to: Private residence   Living Arrangements Parent  step dad, dad and mom taking turns coming to pt's apt.   Available Help at Discharge Available 24 hours/day   Type of Home Aartment   Home Access Stairs  half step only   Home Layout One level   Bathroom Museum/gallery exhibitions officer   Additional Comments pt has shower seat, no slip on shower floor and grab bar in the shower.       Prior Function   Level of Independence Independent   Vocation Full time employment   Engineer, technical sales at [employer]   Leisure going to the park, being outside, walking     ADL   Eating/Feeding Independent   Grooming Independent   Upper Body Bathing Independent   Lower Body Bathing Modified independent   Upper Body Dressing Independent   Lower Body Dressing Modified independent   Toilet Tranfer Modified independent   Toileting - Conservator, museum/gallery -  Hygiene Independent   Tub/Shower Transfer Modified independent     IADL   Shopping Needs to be accompanied on any shopping trip   [employer]  Performs light daily tasks but cannot maintain acceptable level of cleanliness   Meal Prep Able to complete simple warm meal prep   Community Mobility Relies on family or friends for transportation   Medication Management Takes responsibility if medication is prepared in advance in seperate dosage   Financial Management Requires assistance     Mobility   Mobility Status History of falls     Written Expression   Dominant Hand Left     Vision - History   Baseline Vision Wears glasses for distance only  wore glasses to drive only   Additional Comments Pt denies any visual changes     Vision Assessment   Comment Pt denies any visual changes     Activity Tolerance   Activity Tolerance Tolerate 30+ min activity without fatigue     Cognition   Overall Cognitive Status Impaired/Different from  baseline   Mini Mental State Exam  MOCA score: 22/30 (normal is 26 or greater/30.  Pt displays perceptual organizational deficits, impaired working memory, poor error identification, poor abstract thought process and decreased ability to generate novel ideas.     Attention Sustained   Sustained Attention Impaired   Memory Impaired   Memory Impairment --  impaired working memory   Awareness Impaired   Awareness Impairment Emergent impairment   Problem Solving Impaired   Executive Function Self Monitoring;Self Correcting;Decision Making;Reasoning   Reasoning Impaired   Decision Making Impaired   Self Monitoring Impaired   Self Correcting Impaired   Behaviors Poor frustration tolerance     Sensation   Light Touch Appears Intact   Hot/Cold Appears Intact   Proprioception Appears Intact     Coordination   Gross Motor Movements are Fluid and Coordinated Yes   9 Hole Peg Test Right;Left   Right 9 Hole Peg Test 22.96   Left 9 Hole Peg Test 19.61  pt is L handed     Tone   Assessment Location --     ROM / Strength   AROM / PROM / Strength AROM;Strength     AROM   Overall AROM  Within functional limits for tasks performed   Overall AROM Comments BUE's     Strength   Overall Strength Within functional limits for tasks performed   Overall Strength Comments For BUE's except for grip strength see below     Hand Function   Right Hand Gross Grasp Functional   Right Hand Grip (lbs) 50   Left Hand Gross Grasp Impaired   Left Hand Grip (lbs) 40, then 35                              OT Long Term Goals - 05/13/16 1629      OT LONG TERM GOAL #1   Title Pt will be mod I for HEP for grip strength for LUE   Status New               Plan - 05/13/16 1629    Clinical Impression Statement Pt is a 21 year female with 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  EMT suspected  that pt was down for long period of time.  PMH: depression, h/o poly substance abuse.  Pt presents to OT with impaired cognition, impaired high level balance deficits and decreased L grip strength. PT is currently addressing balanc and mobility and  ST is addressing cognitive deficits.  Pt is independent in ADL's and most IADL's.  Will see pt for one treatment session to address L grip strength given that ST and PT are currently addressing other deficts. Pt in agreement.    Rehab Potential Good   OT Frequency 1x / week   OT Duration 2 weeks   OT Treatment/Interventions Therapeutic exercise;Patient/family education   Plan provided HEP for grip strength for L dominant hand.    Recommended Other Services pt currently being seen by ST and PT as well as neuropsychology   Consulted and Agree with Plan of Care Patient      Patient will benefit from skilled therapeutic intervention in order to improve the following deficits and impairments:  Decreased balance, Decreased cognition, Decreased strength  Visit Diagnosis: Frontal lobe and executive function deficit - Plan: Ot plan of care cert/re-cert  Muscle weakness (generalized) - Plan: Ot plan of care cert/re-cert    Problem List Patient Active Problem List   Diagnosis Date Noted  . TBI (traumatic brain injury) (HCC) 05/01/2016  . 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 Respiratory distress   . Glasgow coma scale total score 3-8 (HCC)   . Pneumonia of both lower lobes due to methicillin resistant Staphylococcus aureus (MRSA) (HCC)   . Acute pulmonary edema (HCC)   . Non-traumatic rhabdomyolysis   . Opioid abuse   . Elevated troponin   . Acute respiratory failure with hypoxia (HCC)   . Drug overdose   . Elevated liver enzymes   . Encephalopathy acute   . Encephalopathy 04/19/2016  . Opiate overdose 04/19/2016    Norton Pastel, OTR/L 05/13/2016, 4:40 PM  [facility] [address] [place], [place], [address] Phone: [phone]   Fax:  [phone]  Name: Jane Watkins MRN: [id number] Date of Birth: 11-05-1995

## 2016-05-14 NOTE — Therapy (Signed)
[facility] [address] [place], [place], [address] Phone: [phone]   Fax:  [phone]  Speech Language Pathology Evaluation  Patient Details  Name: Jane Watkins MRN: [id number] Date of Birth: [birth year] Referring Provider: Faith Rogue, MD  Encounter Date: 05/13/2016      End of Session - 05/13/16 1709    Visit Number 1   Number of Visits 17   Date for SLP Re-Evaluation 07/25/16   SLP Start Time 1448   SLP Stop Time  1532   SLP Time Calculation (min) 44 min   Activity Tolerance Patient tolerated treatment well      No past medical history on file.  Past Surgical History:  Procedure Laterality Date  . APPENDECTOMY    . MOUTH SURGERY      There were no vitals filed for this visit.      Subjective Assessment - 05/13/16 1454    Subjective Pt reports attention as her only concern.   Currently in Pain? No/denies   Pain Score 3    Pain Location Knee   Pain Orientation Left   Pain Descriptors / Indicators Aching   Pain Type Acute pain   Pain Onset 1 to 4 weeks ago   Pain Frequency Intermittent            SLP Evaluation [facility] - 05/13/16 1454      SLP Visit Information   SLP Received On 05/12/16   Referring Provider Faith Rogue, MD   Onset Date 04-19-16   Medical Diagnosis Hypoxic encephalopathy     General Information   HPI 21 y/o F with a history of opiate abuse who was found unresponsive next to her boyfriend (who was deceased). She was intubated 3/10-3/14 but failed extubation due to stridor/swelling and was reintubated 3/14 until trach 3/19. Pt with ST on CIR until d/c on 05-08-16.     Prior Functional Status   Cognitive/Linguistic Baseline Within functional limits   Type of Home Apartment    Lives With Alone   Education High school   Vocation Full time employment  [employer]      Cognition   Overall Cognitive Status Impaired/Different from baseline   Area of Impairment  Attention;Awareness   Attention Comments Pt commented she was having difficulty focusing as her only cognitive linguistic deficit. SLP noted processing time (selective attention) as deficit area OT noted working memory (selective attention) as a deficit area   Awareness Emergent   Awareness Comments Pt made errors during the test and self corrected 50% of the time (3/6)   Executive Function Self Monitoring;Self Education officer, community Impairment Verbal basic;Functional basic  no organization with word generation   Self Monitoring Impaired   Self Monitoring Impairment Functional basic  clock drawing   Self Correcting Impaired   Self Correcting Impairment Verbal complex;Functional complex  3/6 self correction on eval   Behaviors --  somewhat flat affect     Auditory Comprehension   Overall Auditory Comprehension Appears within functional limits for tasks assessed     Verbal Expression   Overall Verbal Expression Appears within functional limits for tasks assessed     Oral Motor/Sensory Function   Overall Oral Motor/Sensory Function Appears within functional limits for tasks assessed     Motor Speech   Overall Motor Speech Appears within functional limits for tasks assessed     Standardized Assessments   Standardized Assessments  Other Assessment  Cognitive Linguistic Quick Test-WNL (clock draw-low  score)                         SLP Education - 05/13/16 1709    Education provided Yes   Education Details ST findings, therapy course   Person(s) Educated Patient   Methods Explanation   Comprehension Verbalized understanding          SLP Short Term Goals - 05/14/16 7829      SLP SHORT TERM GOAL #1   Title pt will demo knowledge of errors made in therapy tasks 90% of the time over two sessions   Time 4   Period Weeks   Status New     SLP SHORT TERM GOAL #2   Title pt will demo selective attention adequate to complete  functional mod complex therapy tasks with rare min A back to task   Time 4   Period Weeks   Status New     SLP SHORT TERM GOAL #3   Title pt will complete working memory tasks with 80% succcess over three sessions   Time 4   Period Weeks   Status New          SLP Long Term Goals - 05/14/16 0830      SLP LONG TERM GOAL #1   Title pt will demo divided attention in cognitive linguistic tasks with rare min A   Time 8   Period Weeks   Status New     SLP LONG TERM GOAL #2   Title pt will demo error awareness in mod complex/complex therapy tasks    Time 8   Period Weeks   Status New     SLP LONG TERM GOAL #3   Title pt will complete working memory tasks with modified independence   Time 8   Period Weeks   Status New          Plan - 05/13/16 1709    Clinical Impression Statement Pt presents with higher level cognitive-linguistic deficits in the areas of awareness, attention (working memory and processing speed), and executive function. Skilled ST is warranted at this time to address and improve these abilities in order for the pt to return to work and engage in the community.   Speech Therapy Frequency 2x / week   Duration --  8 weeks   Treatment/Interventions Cognitive reorganization;Internal/external aids;SLP instruction and feedback;Environmental controls;Functional tasks;Cueing hierarchy;Compensatory strategies;Patient/family education   Potential to Achieve Goals Good      Patient will benefit from skilled therapeutic intervention in order to improve the following deficits and impairments:   Cognitive communication deficit    Problem List Patient Active Problem List   Diagnosis Date Noted  . TBI (traumatic brain injury) (HCC) 05/01/2016  . 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 Respiratory distress   . Glasgow coma scale total score 3-8 (HCC)   . Pneumonia of both lower lobes due to methicillin resistant Staphylococcus aureus (MRSA) (HCC)   . Acute  pulmonary edema (HCC)   . Non-traumatic rhabdomyolysis   . Opioid abuse   . Elevated troponin   . Acute respiratory failure with hypoxia (HCC)   . Drug overdose   . Elevated liver enzymes   . Encephalopathy acute   . Encephalopathy 04/19/2016  . Opiate overdose 04/19/2016    [facility] ,MS, CCC-SLP  05/14/2016, 8:43 AM  [facility] [address] [place], [place], [address] Phone: [phone]   Fax:  [phone]  Name: Jane Watkins MRN:  [id number] Date of Birth: 1995/04/08

## 2016-05-20 ENCOUNTER — Encounter (HOSPITAL_COMMUNITY): Payer: Self-pay | Admitting: Family Medicine

## 2016-05-20 ENCOUNTER — Emergency Department (HOSPITAL_COMMUNITY)
Admission: EM | Admit: 2016-05-20 | Discharge: 2016-05-21 | Disposition: A | Discharge status: Home / Self Care | Payer: 59 | Attending: Emergency Medicine | Admitting: Emergency Medicine

## 2016-05-20 DIAGNOSIS — T401X1A Poisoning by heroin, accidental (unintentional), initial encounter: Secondary | ICD-10-CM | POA: Insufficient documentation

## 2016-05-20 DIAGNOSIS — F17210 Nicotine dependence, cigarettes, uncomplicated: Secondary | ICD-10-CM | POA: Insufficient documentation

## 2016-05-20 HISTORY — DX: Poisoning by unspecified drugs, medicaments and biological substances, accidental (unintentional), initial encounter: T50.901A

## 2016-05-20 LAB — CBC WITH DIFFERENTIAL/PLATELET
BASOS ABS: 0 10*3/uL (ref 0.0–0.1)
Basophils Relative: 0 %
EOS PCT: 1 %
Eosinophils Absolute: 0.1 10*3/uL (ref 0.0–0.7)
HCT: 38.7 % (ref 36.0–46.0)
HEMOGLOBIN: 13 g/dL (ref 12.0–15.0)
LYMPHS ABS: 2.4 10*3/uL (ref 0.7–4.0)
LYMPHS PCT: 17 %
MCH: 30.5 pg (ref 26.0–34.0)
MCHC: 33.6 g/dL (ref 30.0–36.0)
MCV: 90.8 fL (ref 78.0–100.0)
Monocytes Absolute: 1 10*3/uL (ref 0.1–1.0)
Monocytes Relative: 7 %
NEUTROS ABS: 10.5 10*3/uL — AB (ref 1.7–7.7)
NEUTROS PCT: 75 %
PLATELETS: 255 10*3/uL (ref 150–400)
RBC: 4.26 MIL/uL (ref 3.87–5.11)
RDW: 13.3 % (ref 11.5–15.5)
WBC: 13.9 10*3/uL — AB (ref 4.0–10.5)

## 2016-05-20 LAB — BASIC METABOLIC PANEL
ANION GAP: 7 (ref 5–15)
BUN: 12 mg/dL (ref 6–20)
CHLORIDE: 110 mmol/L (ref 101–111)
CO2: 26 mmol/L (ref 22–32)
Calcium: 9.3 mg/dL (ref 8.9–10.3)
Creatinine, Ser: 0.57 mg/dL (ref 0.44–1.00)
GFR calc Af Amer: >60 mL/min (ref >60)
GLUCOSE: 138 mg/dL — AB (ref 65–99)
POTASSIUM: 3.6 mmol/L (ref 3.5–5.1)
Sodium: 143 mmol/L (ref 135–145)

## 2016-05-20 MED ORDER — ONDANSETRON HCL 4 MG/2ML IJ SOLN
4.0000 mg | Freq: Once | INTRAMUSCULAR | Status: AC
  Administered 2016-05-20: 4 mg via INTRAVENOUS
  Filled 2016-05-20: qty 2

## 2016-05-20 MED ORDER — KETOROLAC TROMETHAMINE 15 MG/ML IJ SOLN
15.0000 mg | Freq: Once | INTRAMUSCULAR | Status: AC
  Administered 2016-05-20: 15 mg via INTRAVENOUS
  Filled 2016-05-20: qty 1

## 2016-05-20 MED ORDER — ONDANSETRON 4 MG PO TBDP: 4.0000 mg | ORAL_TABLET | Freq: Three times a day (TID) | ORAL | 0 refills | Status: DC | PRN

## 2016-05-20 MED ORDER — SODIUM CHLORIDE 0.9 % IV BOLUS (SEPSIS)
1000.0000 mL | Freq: Once | INTRAVENOUS | Status: AC
  Administered 2016-05-20: 1000 mL via INTRAVENOUS

## 2016-05-20 NOTE — ED Notes (Signed)
Bed: RESB Expected date:  Expected time:  Means of arrival:  Comments: EMS 21 yo female unresponsive and apneic-hx drug abuse/narcan

## 2016-05-20 NOTE — ED Provider Notes (Signed)
[facility]-EMERGENCY DEPT Provider Note   CSN: [id number] Arrival date & time: 05/20/16  2204    History   Chief Complaint Chief Complaint  Patient presents with  . Drug Overdose    HPI Jane Watkins is a 21 y.o. female.  21 year old female with a history of heroin abuse and recent hospitalization for TBI secondary to hypoxic ischemic encephalopathy due to overdose (discharge 05/08/16) presents to the emergency department by EMS. She was found unresponsive with agonal respirations. EMS gave 2 mg Narcan prior to arrival which improved the patient's cognition. She is alert and tearful upon my assessment. She has complaints of nausea and denies chest pain, abdominal pain, and shortness of breath. She states that she snorted heroin for the first time since her discharge from the hospital 2 weeks ago. She reports that she has been "dealing with a lot". She has not been able to reach out to any treatment facilities regarding drug rehabilitation. She explicitly denies suicidal ideations.   The history is provided by the patient and the EMS personnel. No language interpreter was used.  Drug Overdose     Past Medical History:  Diagnosis Date  . Overdose     Patient Active Problem List   Diagnosis Date Noted  . TBI (traumatic brain injury) (HCC) 05/01/2016  . 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 Respiratory distress   . Glasgow coma scale total score 3-8 (HCC)   . Pneumonia of both lower lobes due to methicillin resistant Staphylococcus aureus (MRSA) (HCC)   . Acute pulmonary edema (HCC)   . Non-traumatic rhabdomyolysis   . Opioid abuse   . Elevated troponin   . Acute respiratory failure with hypoxia (HCC)   . Drug overdose   . Elevated liver enzymes   . Encephalopathy acute   . Encephalopathy 04/19/2016  . Opiate overdose 04/19/2016    Past Surgical History:  Procedure Laterality Date  . APPENDECTOMY    . MOUTH SURGERY    . TONGUE SURGERY      OB History    No data  available       Home Medications    Prior to Admission medications   Medication Sig Start Date End Date Taking? Authorizing Provider  clonazePAM (KLONOPIN) 1 MG tablet Take 1 tablet (1 mg total) by mouth 2 (two) times daily. 05/09/16  Yes Daniel J Angiulli, PA-C  FLUoxetine (PROZAC) 20 MG capsule Take 1 capsule (20 mg total) by mouth daily. 05/10/16  Yes Daniel J Angiulli, PA-C  ibuprofen (ADVIL,MOTRIN) 200 MG tablet Take 600 mg by mouth every 6 (six) hours as needed for moderate pain.   Yes Historical Provider, MD  Multiple Vitamin (MULTIVITAMIN WITH MINERALS) TABS tablet Take 1 tablet by mouth daily. 05/10/16  Yes Daniel J Angiulli, PA-C  QUEtiapine (SEROQUEL) 25 MG tablet Take 1 tablet (25 mg total) by mouth at bedtime. 05/09/16  Yes Daniel J Angiulli, PA-C  ranitidine (ZANTAC) 150 MG tablet Take 150 mg by mouth daily as needed for heartburn.   Yes Historical Provider, MD  nicotine (NICODERM CQ - DOSED IN MG/24 HOURS) 21 mg/24hr patch 21 mg patch daily for two weeks then 14 mg daily for three weeks then 7 mg patch daily for three weeks then and stop Patient not taking: Reported on 05/12/2016 05/09/16   Mcarthur Rossetti Angiulli, PA-C  ondansetron (ZOFRAN ODT) 4 MG disintegrating tablet Take 1 tablet (4 mg total) by mouth every 8 (eight) hours as needed for nausea or vomiting. 05/20/16   Antony Madura, PA-C  Family History Family History  Problem Relation Age of Onset  . Anemia Father     Social History Social History  Substance Use Topics  . Smoking status: Current Every Day Smoker    Packs/day: 1.00    Years: 4.00    Types: Cigarettes  . Smokeless tobacco: Never Used  . Alcohol use No     Allergies   Other and Sulfa antibiotics   Review of Systems Review of Systems Ten systems reviewed and are negative for acute change, except as noted in the HPI.    Physical Exam Updated Vital Signs BP 112/73   Pulse (!) 104   Temp 98 F (36.7 C) (Oral)   Resp (!) 21   Ht  (1.575 m)    Wt 49.4 kg   LMP 04/19/2016 Comment: Neg Preg Test 04/24/2016  SpO2 99%   BMI 19.94 kg/m   Physical Exam  Constitutional: She is oriented to person, place, and time. She appears well-developed and well-nourished. No distress.  Tearful and anxious  HENT:  Head: Normocephalic and atraumatic.  Eyes: Conjunctivae and EOM are normal. No scleral icterus.  Pupils dilated bilaterally  Neck: Normal range of motion.  Prior tracheostomy site to anterior neck  Cardiovascular: Regular rhythm and intact distal pulses.   Tachycardia  Pulmonary/Chest: Effort normal. No respiratory distress. She has no wheezes. She has no rales.  Respirations even and unlabored. Lungs CTAB.  Abdominal: She exhibits no distension.  Musculoskeletal: Normal range of motion.  Neurological: She is alert and oriented to person, place, and time. She exhibits normal muscle tone. Coordination normal.  GCS 15. Speech is goal oriented. Patient answers questions appropriately and follows commands.  Skin: Skin is warm and dry. No rash noted. She is not diaphoretic. No erythema. No pallor.  Psychiatric: Her speech is normal. Her mood appears anxious. She expresses no homicidal and no suicidal ideation.  Nursing note and vitals reviewed.    ED Treatments / Results  Labs (all labs ordered are listed, but only abnormal results are displayed) Labs Reviewed  CBC WITH DIFFERENTIAL/PLATELET - Abnormal; Notable for the following:       Result Value   WBC 13.9 (*)    Neutro Abs 10.5 (*)    All other components within normal limits  BASIC METABOLIC PANEL - Abnormal; Notable for the following:    Glucose, Bld 138 (*)    All other components within normal limits    EKG ED ECG REPORT   Date: 05/21/2016  Rate: 100  Rhythm: sinus tachycardia  QRS Axis: normal  Intervals: normal  ST/T Wave abnormalities: normal  Conduction Disutrbances:none  Narrative Interpretation: Sinus tachycardia; no STEMI  Old EKG Reviewed: none  available  I have personally reviewed the EKG tracing and agree with the computerized printout as noted.   Radiology No results found.  Procedures Procedures (including critical care time)  Medications Ordered in ED Medications  sodium chloride 0.9 % bolus 1,000 mL (0 mLs Intravenous Stopped 05/20/16 2331)  ondansetron (ZOFRAN) injection 4 mg (4 mg Intravenous Given 05/20/16 2230)  ketorolac (TORADOL) 15 MG/ML injection 15 mg (15 mg Intravenous Given 05/20/16 2344)  ondansetron (ZOFRAN) injection 4 mg (4 mg Intravenous Given 05/20/16 2344)     Initial Impression / Assessment and Plan / ED Course  I have reviewed the triage vital signs and the nursing notes.  Pertinent labs & imaging results that were available during my care of the patient were reviewed by me and considered in my  medical decision making (see chart for details).     21 year old female presents to the emergency department for evaluation of accidental overdose on heroin. She was given Narcan prior to arrival by EMS. Patient anxious and tearful upon my assessment. She has been monitored in the emergency department for 2 hours without any decompensation. Leukocytosis noted; likely stress response. Plan for outpatient management. I have encouraged that patient look into different detox/rehab facilities regarding her heroin abuse. Return precautions provided. Patient discharged in stable condition in the care of her family.   Final Clinical Impressions(s) / ED Diagnoses   Final diagnoses:  Accidental overdose of heroin, initial encounter    New Prescriptions Discharge Medication List as of 05/20/2016 11:56 PM    START taking these medications   Details  ondansetron (ZOFRAN ODT) 4 MG disintegrating tablet Take 1 tablet (4 mg total) by mouth every 8 (eight) hours as needed for nausea or vomiting., Starting Tue 05/20/2016, Print         [place], PA-C 05/21/16 6213    Linwood Dibbles, MD 05/21/16 2055

## 2016-05-20 NOTE — Discharge Instructions (Signed)
Take Zofran for nausea, as needed. Take tylenol or ibuprofen for headache.

## 2016-05-20 NOTE — ED Triage Notes (Signed)
Patient was transported by [facility]. Pt has overdosed on HEROIN. When EMS arrived, patient was unresponsive, agonal breathing, strong pulse, and had to be bagged-masked. First responders administered NARCAN  IM. When patient arrived, she was alert, oriented x 4 with complaints of nausea.

## 2016-05-21 NOTE — Therapy (Signed)
[place] [address] [place], [place], [address] Phone: [phone]   Fax:  [phone]  Patient Details  Name: Jane Watkins MRN: [id number] Date of Birth: 11-07-1995 Referring Provider:  Alger Simons, MD  Encounter Date: 05/21/2016 SPEECH THERAPY DISCHARGE SUMMARY  Visits from Start of Care:  One (eval)  Current functional level related to goals / functional outcomes: Pt was seen for initial ST eval last week where high level cognitive linguistic deficits were ID'd. Pt was scheduled for skilled ST at x2/week. Pt called clinic today requesting d/c from ST due to inpatient rehab (assume this means drug rehab).   Remaining deficits: All deficits remain.   Education / Equipment: ST therapy course.   Plan: Patient agrees to discharge.  Patient goals were not met. Patient is being discharged due to the patient's request.  ?????      [facility] ,MS, CCC-SLP  05/21/2016, 4:13 PM  [place] [address] [place], [place], [address] Phone: [phone]   Fax:  [phone]

## 2016-05-22 ENCOUNTER — Encounter: Payer: 59 | Admitting: Occupational Therapy

## 2016-05-22 ENCOUNTER — Ambulatory Visit: Payer: 59 | Admitting: Rehabilitation

## 2016-05-23 ENCOUNTER — Ambulatory Visit: Payer: 59 | Admitting: Rehabilitation

## 2016-05-27 ENCOUNTER — Encounter: Payer: 59 | Admitting: Speech Pathology

## 2016-05-27 ENCOUNTER — Encounter: Payer: 59 | Admitting: Occupational Therapy

## 2016-05-27 ENCOUNTER — Ambulatory Visit: Payer: 59 | Admitting: Physical Therapy

## 2016-05-28 ENCOUNTER — Ambulatory Visit: Payer: 59 | Admitting: Physical Therapy

## 2016-05-29 ENCOUNTER — Encounter: Payer: 59 | Admitting: Speech Pathology

## 2016-05-29 ENCOUNTER — Ambulatory Visit: Payer: 59 | Admitting: Rehabilitation

## 2016-05-30 ENCOUNTER — Encounter: Payer: 59 | Admitting: Occupational Therapy

## 2016-05-30 ENCOUNTER — Ambulatory Visit: Payer: 59 | Admitting: Rehabilitation

## 2016-06-02 ENCOUNTER — Ambulatory Visit: Payer: 59 | Admitting: Rehabilitation

## 2016-06-02 ENCOUNTER — Encounter: Payer: 59 | Admitting: Occupational Therapy

## 2016-06-03 ENCOUNTER — Encounter: Payer: 59 | Admitting: Speech Pathology

## 2016-06-03 ENCOUNTER — Ambulatory Visit: Payer: 59 | Admitting: Physical Therapy

## 2016-06-04 ENCOUNTER — Encounter: Payer: 59 | Admitting: Occupational Therapy

## 2016-06-04 ENCOUNTER — Ambulatory Visit: Payer: 59 | Admitting: Physical Therapy

## 2016-06-10 ENCOUNTER — Encounter: Payer: 59 | Admitting: Occupational Therapy

## 2016-06-10 ENCOUNTER — Ambulatory Visit: Payer: 59 | Admitting: Physical Therapy

## 2016-06-12 ENCOUNTER — Ambulatory Visit: Payer: 59 | Admitting: Physical Therapy

## 2016-06-12 ENCOUNTER — Encounter: Payer: 59 | Admitting: Speech Pathology

## 2016-06-12 ENCOUNTER — Encounter: Payer: 59 | Admitting: Occupational Therapy

## 2016-06-17 ENCOUNTER — Encounter: Payer: 59 | Admitting: Physical Medicine & Rehabilitation

## 2016-06-18 ENCOUNTER — Encounter: Payer: Self-pay | Admitting: Physical Medicine & Rehabilitation

## 2016-06-18 ENCOUNTER — Encounter: Payer: 59 | Attending: Physical Medicine & Rehabilitation | Admitting: Physical Medicine & Rehabilitation

## 2016-06-18 DIAGNOSIS — M6281 Muscle weakness (generalized): Secondary | ICD-10-CM | POA: Diagnosis not present

## 2016-06-18 DIAGNOSIS — M6282 Rhabdomyolysis: Secondary | ICD-10-CM | POA: Insufficient documentation

## 2016-06-18 DIAGNOSIS — F17210 Nicotine dependence, cigarettes, uncomplicated: Secondary | ICD-10-CM | POA: Diagnosis not present

## 2016-06-18 MED ORDER — QUETIAPINE FUMARATE 25 MG PO TABS: 25.0000 mg | ORAL_TABLET | Freq: Every day | ORAL | 4 refills | Status: DC

## 2016-06-18 MED ORDER — DICLOFENAC SODIUM 75 MG PO TBEC: 75.0000 mg | DELAYED_RELEASE_TABLET | Freq: Two times a day (BID) | ORAL | 3 refills | Status: DC

## 2016-06-18 MED ORDER — CLONAZEPAM 0.5 MG PO TABS: 0.5000 mg | ORAL_TABLET | Freq: Two times a day (BID) | ORAL | 1 refills | Status: DC

## 2016-06-18 NOTE — Progress Notes (Signed)
Subjective:    Patient ID: Jane Watkins, female    DOB: [birth year], 20 y.o.   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 She states she has residual weakness in her left leg. Her may have some short term memory deficits at times. Her attention appears to be improving. After rehab she was in an inpt drug rehab program. She  reports that she was able to attend sessions in drug relab for 8 hours per day in the program and seemed to do well cognitively for the duration of each day. She remembers bits and pieces of rehab but most of her memory is "things that I imagined".    She seems to be getting along fairly well with others. She does have some situational anxiety sometimes in crowds and around others.   She has occasional pain in her left leg which responds to ibuprofen. She sleeps well if she uses seroquel.   Jane Watkins was in the ED for heroine OD again early last month two weeks after leaving inpt rehab at [facility]. She subseqeuntly went to an inpatient drug program in [place], [place]. She returned to [place] a week ago and has an IOP set up currently with [facility] with outpt group, individual therapy as well as UDS's. She tells me that the bad influences are "no longer around". She has friends with her today who provide her support.   Pain Inventory Average Pain 0 Pain Right Now 0 My pain is no pain  In the last 24 hours, has pain interfered with the following? General activity 0 Relation with others 0 Enjoyment of life 0 What TIME of day is your pain at its worst? no pain Sleep (in general) Poor  Pain is worse with: no pain Pain improves with: no pain Relief from Meds: no pain  Mobility walk without assistance ability to climb steps?  no do you drive?  yes  Function not employed: date last employed  .  Neuro/Psych dizziness confusion depression anxiety  Prior Studies Any changes since last visit?  no  Physicians involved in your care Any changes since last visit?  no   Family History  Problem Relation Age of Onset  . Anemia Father    Social History   Social History  . Marital status: Single    Spouse name: N/A  . Number of children: N/A  . Years of education: N/A   Social History Main Topics  . Smoking status: Current Every Day Smoker    Packs/day: 1.00    Years: 4.00    Types: Cigarettes  . Smokeless tobacco: Never Used  . Alcohol use No  . Drug use: Yes     Comment: Heroin   . Sexual activity: Not Asked   Other Topics Concern  . None   Social History Narrative  . None   Past Surgical History:  Procedure Laterality Date  . APPENDECTOMY    . MOUTH SURGERY    . TONGUE SURGERY     Past Medical History:  Diagnosis Date  . Overdose    BP 118/82 (BP Location: Left Arm, Patient Position: Sitting, Cuff Size: Normal)   Pulse (!) 112   Resp 14   SpO2 98%   Opioid Risk Score:   Fall Risk Score:  `1  Depression screen PHQ 2/9  No flowsheet data found.  Review of  Systems  Constitutional: Negative.   HENT: Negative.   Eyes: Negative.   Respiratory: Negative.   Cardiovascular: Negative.   Gastrointestinal: Negative.   Endocrine: Negative.   Genitourinary: Negative.   Musculoskeletal: Negative.   Skin: Negative.   Allergic/Immunologic: Negative.   Neurological: Negative.   Hematological: Negative.   Psychiatric/Behavioral: Positive for behavioral problems.  All other systems reviewed and are negative.      Objective:   Physical Exam Head. Normocephalic Eyes. Pupils round and reactive to light Neck. Trach stoma closed Cardiovascular.  RRR   Respiratory. CTA B GI. Soft. Bowel sounds normal. She exhibits no distention  Skin. Warm and dry Neurological. Alert and oriented x 3. Spelled world forwards and backwards. Serial 7's intact.  Able to sequence numbers. Abstract thinking appropriate. No issues with attention or focus. Conversation and language normal  Strength 5/5 except for left quad which is 2+/5. Gait and tandem gait nearly normal. Has more difficulty with marching and when she attempts to kick left leg out.  Psych: affect more dynamic and quite appropriate       Assessment & Plan:  1. Hypoxic encephalopathy cognitive deficits, balance disorder secondary to drug overdose             -she has made nice gains with cognition---appears to be near baseline 2. Hx Heroine abuse---just discharged from inpatient program in [place]  3. Left quad weakness, pain. ?etiology. rhabdo vs femoral nerve injury  Plan: 1. Continue outpt drug rehab program--needs to establish psychiatry follow up in addition to her group progrmas 2. Trial of diclofenac 75mg  BID for left quad pain. 3. Referral to outpt neuro-rehab PT for left quad strengthening,gait. 4. Discussed longterm plan as well. She would like to go back to school. She has a lot of supportive people around her. She needs to avoid the others who have influenced her with drugs, etc----she seems to be aware of that.  5. Refilled klonopin but decreased to 0.5mg  BID. Would expect that we could wean to off.  6. Refilled seroquel for sleep 25mg  qhs  Thirty minutes of face to face patient care time were spent during this visit. All questions were encouraged and answered. Follow up in  6 weeks time

## 2016-06-18 NOTE — Patient Instructions (Signed)
PLEASE FEEL FREE TO CALL OUR OFFICE WITH ANY PROBLEMS OR QUESTIONS ([phone])      

## 2016-06-20 ENCOUNTER — Ambulatory Visit: Payer: 59 | Admitting: Rehabilitation

## 2016-06-20 ENCOUNTER — Encounter: Payer: 59 | Admitting: Occupational Therapy

## 2016-06-20 ENCOUNTER — Encounter: Payer: 59 | Admitting: *Deleted

## 2016-06-23 ENCOUNTER — Encounter: Payer: 59 | Admitting: Occupational Therapy

## 2016-06-23 ENCOUNTER — Ambulatory Visit: Payer: 59 | Admitting: Rehabilitation

## 2016-06-26 ENCOUNTER — Encounter: Payer: 59 | Admitting: Occupational Therapy

## 2016-06-26 ENCOUNTER — Ambulatory Visit: Payer: 59 | Admitting: Rehabilitation

## 2016-07-30 ENCOUNTER — Encounter: Payer: 59 | Attending: Physical Medicine & Rehabilitation | Admitting: Physical Medicine & Rehabilitation

## 2016-07-30 DIAGNOSIS — F17210 Nicotine dependence, cigarettes, uncomplicated: Secondary | ICD-10-CM | POA: Insufficient documentation

## 2016-07-30 DIAGNOSIS — M6281 Muscle weakness (generalized): Secondary | ICD-10-CM | POA: Insufficient documentation

## 2016-07-30 DIAGNOSIS — M6282 Rhabdomyolysis: Secondary | ICD-10-CM | POA: Insufficient documentation

## 2016-10-06 ENCOUNTER — Other Ambulatory Visit: Payer: Self-pay | Admitting: Physical Medicine & Rehabilitation

## 2016-10-06 DIAGNOSIS — M6282 Rhabdomyolysis: Secondary | ICD-10-CM

## 2016-10-06 DIAGNOSIS — M6281 Muscle weakness (generalized): Secondary | ICD-10-CM

## 2016-12-12 ENCOUNTER — Encounter (HOSPITAL_COMMUNITY): Payer: Self-pay | Admitting: Emergency Medicine

## 2016-12-12 ENCOUNTER — Emergency Department (HOSPITAL_COMMUNITY)
Admission: EM | Admit: 2016-12-12 | Discharge: 2016-12-12 | Disposition: A | Discharge status: Home / Self Care | Payer: 59 | Attending: Emergency Medicine | Admitting: Emergency Medicine

## 2016-12-12 DIAGNOSIS — T401X1A Poisoning by heroin, accidental (unintentional), initial encounter: Secondary | ICD-10-CM | POA: Diagnosis present

## 2016-12-12 DIAGNOSIS — Z79899 Other long term (current) drug therapy: Secondary | ICD-10-CM | POA: Insufficient documentation

## 2016-12-12 DIAGNOSIS — F17210 Nicotine dependence, cigarettes, uncomplicated: Secondary | ICD-10-CM | POA: Diagnosis not present

## 2016-12-12 LAB — BASIC METABOLIC PANEL
Anion gap: 9 (ref 5–15)
BUN: 7 mg/dL (ref 6–20)
CALCIUM: 8.2 mg/dL — AB (ref 8.9–10.3)
CO2: 21 mmol/L — ABNORMAL LOW (ref 22–32)
CREATININE: 0.71 mg/dL (ref 0.44–1.00)
Chloride: 106 mmol/L (ref 101–111)
GFR calc Af Amer: >60 mL/min (ref >60)
GLUCOSE: 103 mg/dL — AB (ref 65–99)
POTASSIUM: 3.7 mmol/L (ref 3.5–5.1)
SODIUM: 136 mmol/L (ref 135–145)

## 2016-12-12 LAB — CBC
HEMATOCRIT: 36.6 % (ref 36.0–46.0)
Hemoglobin: 12.1 g/dL (ref 12.0–15.0)
MCH: 29.4 pg (ref 26.0–34.0)
MCHC: 33.1 g/dL (ref 30.0–36.0)
MCV: 88.8 fL (ref 78.0–100.0)
PLATELETS: 229 10*3/uL (ref 150–400)
RBC: 4.12 MIL/uL (ref 3.87–5.11)
RDW: 13.3 % (ref 11.5–15.5)
WBC: 7.2 10*3/uL (ref 4.0–10.5)

## 2016-12-12 LAB — RAPID URINE DRUG SCREEN, HOSP PERFORMED
Amphetamines: NOT DETECTED
BARBITURATES: NOT DETECTED
BENZODIAZEPINES: NOT DETECTED
Cocaine: POSITIVE — AB
OPIATES: POSITIVE — AB
Tetrahydrocannabinol: POSITIVE — AB

## 2016-12-12 LAB — ETHANOL: Alcohol, Ethyl (B): <10 mg/dL (ref <10)

## 2016-12-12 MED ORDER — NALOXONE HCL 2 MG/2ML IJ SOSY
PREFILLED_SYRINGE | INTRAMUSCULAR | Status: AC
  Filled 2016-12-12: qty 2

## 2016-12-12 MED ORDER — NALOXONE HCL 2 MG/2ML IJ SOSY
2.0000 mg | PREFILLED_SYRINGE | Freq: Once | INTRAMUSCULAR | Status: AC
  Administered 2016-12-12: 2 mg via INTRAVENOUS

## 2016-12-12 MED ORDER — NALOXONE HCL 4 MG/0.1ML NA LIQD
1.0000 | Freq: Once | NASAL | Status: AC
  Administered 2016-12-12: 1 via NASAL
  Filled 2016-12-12: qty 4

## 2016-12-12 NOTE — ED Provider Notes (Addendum)
[facility] EMERGENCY DEPARTMENT Provider Note   CSN: [id number] Arrival date & time: 12/12/16  1949     History   Chief Complaint Chief Complaint  Patient presents with  . Drug Overdose    HPI Jane Watkins is a 21 y.o. female.  Patient presents with accidental heroin overdose - friend stating pt recently started using heroin.  Pt w resp depression and unresponsive - level 5 caveat.   The history is provided by the patient. The history is limited by the condition of the patient.  Drug Overdose     Past Medical History:  Diagnosis Date  . Overdose     Patient Active Problem List   Diagnosis Date Noted  . Quadriceps weakness 06/18/2016  . TBI (traumatic brain injury) ([place]) 05/01/2016  . 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 Respiratory distress   . Glasgow coma scale total score 3-8 ([place])   . Pneumonia of both lower lobes due to methicillin resistant Staphylococcus aureus (MRSA) ([place])   . Acute pulmonary edema (HCC)   . Non-traumatic rhabdomyolysis   . Opioid abuse ([place])   . Elevated troponin   . Acute respiratory failure with hypoxia ([place])   . Drug overdose   . Elevated liver enzymes   . Encephalopathy acute   . Encephalopathy 04/19/2016  . Opiate overdose ([place]) 04/19/2016    Past Surgical History:  Procedure Laterality Date  . APPENDECTOMY    . MOUTH SURGERY    . TONGUE SURGERY      OB History    No data available       Home Medications    Prior to Admission medications   Medication Sig Start Date End Date Taking? Authorizing Provider  clonazePAM (KLONOPIN) 0.5 MG tablet Take 1 tablet (0.5 mg total) by mouth 2 (two) times daily. 06/18/16   Meredith Staggers, MD  diclofenac (VOLTAREN) 75 MG EC tablet Take 1 tablet (75 mg total) by mouth 2 (two) times daily with a meal. 06/18/16   Meredith Staggers, MD  FLUoxetine (PROZAC) 20 MG capsule Take 1 capsule (20 mg total) by mouth daily. 05/10/16   Angiulli, Lavon Paganini, PA-C    ibuprofen (ADVIL,MOTRIN) 200 MG tablet Take 600 mg by mouth every 6 (six) hours as needed for moderate pain.    [provider]  Multiple Vitamin (MULTIVITAMIN WITH MINERALS) TABS tablet Take 1 tablet by mouth daily. 05/10/16   Angiulli, Lavon Paganini, PA-C  QUEtiapine (SEROQUEL) 25 MG tablet Take 1 tablet (25 mg total) by mouth at bedtime. 06/18/16   Meredith Staggers, MD  ranitidine (ZANTAC) 150 MG tablet Take 150 mg by mouth daily as needed for heartburn.    [provider]    Family History Family History  Problem Relation Age of Onset  . Anemia Father     Social History Social History  Substance Use Topics  . Smoking status: Current Every Day Smoker    Packs/day: 1.00    Years: 4.00    Types: Cigarettes  . Smokeless tobacco: Never Used  . Alcohol use No     Allergies   Other and Sulfa antibiotics   Review of Systems Review of Systems  Unable to perform ROS: Mental status change  overdose/unresponsive - level 5 caveat.    Physical Exam Updated Vital Signs BP (!) 148/87 (BP Location: Right Arm)   Pulse (!) 154   Temp 98.5 F (36.9 C) (Oral)   Resp 18   Ht 1.575 m ('5\' 2"' )  Wt 56.7 kg (125 lb)   LMP  (LMP Unknown)   SpO2 100%   BMI 22.86 kg/m   Physical Exam  Constitutional: She appears well-developed and well-nourished. No distress.  HENT:  Head: Atraumatic.  Mouth/Throat: Oropharynx is clear and moist.  Eyes: Pupils are equal, round, and reactive to light. Conjunctivae are normal. No scleral icterus.  Neck: Neck supple. No tracheal deviation present.  Cardiovascular: Regular rhythm, normal heart sounds and intact distal pulses.  Exam reveals no gallop and no friction rub.   No murmur heard. Tachycardic.   Pulmonary/Chest: No respiratory distress.  V shallow/slow resp effort.   Abdominal: Soft. Normal appearance and bowel sounds are normal. She exhibits no distension. There is no tenderness.  Genitourinary:  Genitourinary Comments: No cva  tenderness  Musculoskeletal: She exhibits no edema.  Neurological:  Obtunded. V poorly responsive/not compliant w exam.   Skin: Skin is warm and dry. No rash noted. She is not diaphoretic.  Psychiatric:  Obtunded/unresponsive.   Nursing note and vitals reviewed.    ED Treatments / Results  Labs (all labs ordered are listed, but only abnormal results are displayed) Results for orders placed or performed during the hospital encounter of 16/10/96  Basic metabolic panel  Result Value Ref Range   Sodium 136 135 - 145 mmol/L   Potassium 3.7 3.5 - 5.1 mmol/L   Chloride 106 101 - 111 mmol/L   CO2 21 (L) 22 - 32 mmol/L   Glucose, Bld 103 (H) 65 - 99 mg/dL   BUN 7 6 - 20 mg/dL   Creatinine, Ser 0.71 0.44 - 1.00 mg/dL   Calcium 8.2 (L) 8.9 - 10.3 mg/dL   GFR calc non Af Amer >60 >60 mL/min   GFR calc Af Amer >60 >60 mL/min   Anion gap 9 5 - 15  CBC  Result Value Ref Range   WBC 7.2 4.0 - 10.5 K/uL   RBC 4.12 3.87 - 5.11 MIL/uL   Hemoglobin 12.1 12.0 - 15.0 g/dL   HCT 36.6 36.0 - 46.0 %   MCV 88.8 78.0 - 100.0 fL   MCH 29.4 26.0 - 34.0 pg   MCHC 33.1 30.0 - 36.0 g/dL   RDW 13.3 11.5 - 15.5 %   Platelets 229 150 - 400 K/uL  Ethanol  Result Value Ref Range   Alcohol, Ethyl (B) <10 <10 mg/dL   EKG  EKG Interpretation None       Radiology No results found.  Procedures Procedures (including critical care time)  Medications Ordered in ED Medications  naloxone (NARCAN) injection 2 mg (2 mg Intravenous Given 12/12/16 1955)     Initial Impression / Assessment and Plan / ED Course  I have reviewed the triage vital signs and the nursing notes.  Pertinent labs & imaging results that were available during my care of the patient were reviewed by me and considered in my medical decision making (see chart for details).  Bag assisted ventilations from triage to resuscitation room.  Continuous pulse ox and monitor. o2 mask/bag assisted ventilations.   Iv.  Narcan 2 mg  iv.  Post narcan, improvement in mental status and resp effort.   Continued monitoring.   Reviewed nursing notes and prior charts for additional history.   Patient awake and alert on recheck.  Indicates occurred accidentally, no thoughts of self harm.   Narcan kit for home ordered.   Pt remains awake and alert. No resp distress.   Pt remains asymptomatic. Requests d/c to home.  Pt appears stable for d/c.     Final Clinical Impressions(s) / ED Diagnoses   Final diagnoses:  None    New Prescriptions New Prescriptions   No medications on file         Lajean Saver, MD 12/12/16 2321

## 2016-12-12 NOTE — ED Triage Notes (Signed)
Brought through front pov unresponsive.  Bystander reports she has history of heroine use and recently started shooting up.

## 2016-12-12 NOTE — ED Notes (Signed)
Narcan scanned and given to patient to take home per physician instruction.  Taught patient and family member about use.  Verbalizes understanding.

## 2016-12-12 NOTE — ED Notes (Signed)
Alert and answering questions immediately after giving narcan.

## 2016-12-12 NOTE — Discharge Instructions (Signed)
It was our pleasure to provide your ER care today - we hope that you feel better.  Do not use drugs/heroin - drug use can result in accidental death.  Use resource guide provided for help.   In event heroin/narcotic overdose - use narcan as directed.   Return to ER right away if worse, symptoms recur, new symptoms, other concern.

## 2017-04-20 ENCOUNTER — Encounter: Payer: Self-pay | Admitting: Occupational Therapy

## 2017-04-20 DIAGNOSIS — R41844 Frontal lobe and executive function deficit: Secondary | ICD-10-CM

## 2017-04-20 NOTE — Therapy (Signed)
[facility] [address] [place], [place], [address] Phone: [phone]   Fax:  [phone]  Occupational Therapy Treatment  Patient Details  Name: Jane Watkins MRN: [id number] Date of Birth: 14-Mar-1995 Referring Provider: Alger Simons, MD   Encounter Date: 04/20/2017    Past Medical History:  Diagnosis Date  . Overdose     Past Surgical History:  Procedure Laterality Date  . APPENDECTOMY    . MOUTH SURGERY    . TONGUE SURGERY      There were no vitals filed for this visit.                             OT Long Term Goals - 04/20/17 0900      OT LONG TERM GOAL #1   Title  Pt will be mod I for HEP for grip strength for LUE    Status  Unable to assess            Plan - 04/20/17 0900    Clinical Impression Statement  Pt did not return to therapy - will d/c from OT at this time.       Patient will benefit from skilled therapeutic intervention in order to improve the following deficits and impairments:     Visit Diagnosis: Frontal lobe and executive function deficit    Problem List Patient Active Problem List   Diagnosis Date Noted  . Quadriceps weakness 06/18/2016  . TBI (traumatic brain injury) ([place]) 05/01/2016  . 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 Respiratory distress   . Glasgow coma scale total score 3-8 ([place])   . Pneumonia of both lower lobes due to methicillin resistant Staphylococcus aureus (MRSA) ([place])   . Acute pulmonary edema (HCC)   . Non-traumatic rhabdomyolysis   . Opioid abuse ([place])   . Elevated troponin   . Acute respiratory failure with hypoxia ([place])   . Drug overdose   . Elevated liver enzymes   . Encephalopathy acute   . Encephalopathy 04/19/2016  . Opiate overdose ([place]) 04/19/2016  OCCUPATIONAL THERAPY DISCHARGE SUMMARY  Visits from Start of Care: 1  Current functional level related to goals / functional outcomes: See eval pt did  not return    Remaining deficits: See eval pt did not return   Education / Equipment: See eval pt did not return Plan: Patient agrees to discharge.  Patient goals were not met. Patient is being discharged due to not returning since the last visit.  ?????       Forde Radon [facility] 04/20/2017, 9:01 AM  [facility] [address] [place], [place], [address] Phone: [phone]   Fax:  [phone]  Name: Jane Watkins MRN: [id number] Date of Birth: 02-Jul-1995

## 2017-09-20 ENCOUNTER — Other Ambulatory Visit: Payer: Self-pay

## 2017-09-20 ENCOUNTER — Inpatient Hospital Stay (HOSPITAL_COMMUNITY): Payer: 59 | Admitting: Certified Registered Nurse Anesthetist

## 2017-09-20 ENCOUNTER — Encounter (HOSPITAL_COMMUNITY)
Admission: EM | Disposition: A | Discharge status: Home / Self Care | Payer: Self-pay | Source: Home / Self Care | Attending: Internal Medicine

## 2017-09-20 ENCOUNTER — Encounter (HOSPITAL_COMMUNITY): Payer: Self-pay

## 2017-09-20 ENCOUNTER — Inpatient Hospital Stay (HOSPITAL_COMMUNITY)
Admission: EM | Admit: 2017-09-20 | Discharge: 2017-09-24 | DRG: 581 | Disposition: A | Discharge status: Home / Self Care | Payer: 59 | Attending: Internal Medicine | Admitting: Internal Medicine

## 2017-09-20 ENCOUNTER — Emergency Department (HOSPITAL_COMMUNITY): Payer: 59

## 2017-09-20 DIAGNOSIS — F17210 Nicotine dependence, cigarettes, uncomplicated: Secondary | ICD-10-CM | POA: Diagnosis present

## 2017-09-20 DIAGNOSIS — L03119 Cellulitis of unspecified part of limb: Secondary | ICD-10-CM | POA: Diagnosis present

## 2017-09-20 DIAGNOSIS — L0390 Cellulitis, unspecified: Secondary | ICD-10-CM

## 2017-09-20 DIAGNOSIS — F1910 Other psychoactive substance abuse, uncomplicated: Secondary | ICD-10-CM | POA: Diagnosis not present

## 2017-09-20 DIAGNOSIS — L02414 Cutaneous abscess of left upper limb: Secondary | ICD-10-CM | POA: Diagnosis present

## 2017-09-20 DIAGNOSIS — L03114 Cellulitis of left upper limb: Secondary | ICD-10-CM | POA: Diagnosis not present

## 2017-09-20 DIAGNOSIS — F1190 Opioid use, unspecified, uncomplicated: Secondary | ICD-10-CM | POA: Diagnosis present

## 2017-09-20 DIAGNOSIS — F1490 Cocaine use, unspecified, uncomplicated: Secondary | ICD-10-CM | POA: Diagnosis present

## 2017-09-20 DIAGNOSIS — Z882 Allergy status to sulfonamides status: Secondary | ICD-10-CM | POA: Diagnosis not present

## 2017-09-20 DIAGNOSIS — L02419 Cutaneous abscess of limb, unspecified: Secondary | ICD-10-CM | POA: Diagnosis not present

## 2017-09-20 DIAGNOSIS — L0291 Cutaneous abscess, unspecified: Secondary | ICD-10-CM | POA: Diagnosis not present

## 2017-09-20 HISTORY — DX: Migraine, unspecified, not intractable, without status migrainosus: G43.909

## 2017-09-20 HISTORY — DX: Depression, unspecified: F32.A

## 2017-09-20 HISTORY — DX: Poisoning by unspecified drugs, medicaments and biological substances, accidental (unintentional), initial encounter: T50.901A

## 2017-09-20 HISTORY — DX: Headache: R51

## 2017-09-20 HISTORY — DX: Other psychoactive substance abuse, uncomplicated: F19.10

## 2017-09-20 HISTORY — DX: Headache, unspecified: R51.9

## 2017-09-20 HISTORY — DX: Anxiety disorder, unspecified: F41.9

## 2017-09-20 HISTORY — DX: Major depressive disorder, single episode, unspecified: F32.9

## 2017-09-20 HISTORY — PX: I & D EXTREMITY: SHX5045

## 2017-09-20 HISTORY — DX: Gastro-esophageal reflux disease without esophagitis: K21.9

## 2017-09-20 HISTORY — DX: Acute kidney failure, unspecified: N17.9

## 2017-09-20 LAB — CBC WITH DIFFERENTIAL/PLATELET
BASOS PCT: 1 %
Basophils Absolute: 0.2 10*3/uL — ABNORMAL HIGH (ref 0.0–0.1)
Eosinophils Absolute: 0.2 10*3/uL (ref 0.0–0.7)
Eosinophils Relative: 1 %
HEMATOCRIT: 44.3 % (ref 36.0–46.0)
HEMOGLOBIN: 13.8 g/dL (ref 12.0–15.0)
LYMPHS PCT: 21 %
Lymphs Abs: 3.5 10*3/uL (ref 0.7–4.0)
MCH: 28.1 pg (ref 26.0–34.0)
MCHC: 31.2 g/dL (ref 30.0–36.0)
MCV: 90.2 fL (ref 78.0–100.0)
MONOS PCT: 7 %
Monocytes Absolute: 1.2 10*3/uL — ABNORMAL HIGH (ref 0.1–1.0)
NEUTROS ABS: 11.6 10*3/uL — AB (ref 1.7–7.7)
NEUTROS PCT: 70 %
Platelets: 359 10*3/uL (ref 150–400)
RBC: 4.91 MIL/uL (ref 3.87–5.11)
RDW: 13.5 % (ref 11.5–15.5)
WBC: 16.7 10*3/uL — ABNORMAL HIGH (ref 4.0–10.5)

## 2017-09-20 LAB — I-STAT CHEM 8, ED
BUN: 10 mg/dL (ref 6–20)
CALCIUM ION: 1.11 mmol/L — AB (ref 1.15–1.40)
CHLORIDE: 108 mmol/L (ref 98–111)
CREATININE: 0.5 mg/dL (ref 0.44–1.00)
Glucose, Bld: 118 mg/dL — ABNORMAL HIGH (ref 70–99)
HEMATOCRIT: 43 % (ref 36.0–46.0)
Hemoglobin: 14.6 g/dL (ref 12.0–15.0)
Potassium: 5 mmol/L (ref 3.5–5.1)
Sodium: 140 mmol/L (ref 135–145)
TCO2: 22 mmol/L (ref 22–32)

## 2017-09-20 LAB — URINALYSIS, ROUTINE W REFLEX MICROSCOPIC
BILIRUBIN URINE: NEGATIVE
GLUCOSE, UA: NEGATIVE mg/dL
Hgb urine dipstick: NEGATIVE
Ketones, ur: NEGATIVE mg/dL
Leukocytes, UA: NEGATIVE
Nitrite: NEGATIVE
PH: 6 (ref 5.0–8.0)
Protein, ur: NEGATIVE mg/dL
SPECIFIC GRAVITY, URINE: 1.003 — AB (ref 1.005–1.030)

## 2017-09-20 LAB — PROTIME-INR
INR: 1.14
Prothrombin Time: 14.5 seconds (ref 11.4–15.2)

## 2017-09-20 LAB — LACTIC ACID, PLASMA: LACTIC ACID, VENOUS: 1.5 mmol/L (ref 0.5–1.9)

## 2017-09-20 LAB — RAPID URINE DRUG SCREEN, HOSP PERFORMED
AMPHETAMINES: NOT DETECTED
BENZODIAZEPINES: NOT DETECTED
Barbiturates: NOT DETECTED
COCAINE: POSITIVE — AB
Opiates: POSITIVE — AB
TETRAHYDROCANNABINOL: NOT DETECTED

## 2017-09-20 LAB — I-STAT BETA HCG BLOOD, ED (MC, WL, AP ONLY): I-stat hCG, quantitative: <5 m[IU]/mL (ref <5)

## 2017-09-20 LAB — ETHANOL: Alcohol, Ethyl (B): <10 mg/dL (ref <10)

## 2017-09-20 LAB — APTT: APTT: 32 s (ref 24–36)

## 2017-09-20 SURGERY — IRRIGATION AND DEBRIDEMENT EXTREMITY
Anesthesia: General | Site: Arm Upper | Laterality: Left

## 2017-09-20 MED ORDER — 0.9 % SODIUM CHLORIDE (POUR BTL) OPTIME
TOPICAL | Status: DC | PRN
  Administered 2017-09-20: 1000 mL

## 2017-09-20 MED ORDER — ONDANSETRON HCL 4 MG PO TABS: 4.0000 mg | ORAL_TABLET | Freq: Four times a day (QID) | ORAL | Status: DC | PRN

## 2017-09-20 MED ORDER — SODIUM CHLORIDE 0.9 % IV BOLUS
1000.0000 mL | Freq: Once | INTRAVENOUS | Status: AC
  Administered 2017-09-20: 1000 mL via INTRAVENOUS

## 2017-09-20 MED ORDER — KETOROLAC TROMETHAMINE 30 MG/ML IJ SOLN
30.0000 mg | Freq: Once | INTRAMUSCULAR | Status: AC
  Administered 2017-09-20: 30 mg via INTRAVENOUS
  Filled 2017-09-20: qty 1

## 2017-09-20 MED ORDER — ZOLPIDEM TARTRATE 5 MG PO TABS: 5.0000 mg | ORAL_TABLET | Freq: Every evening | ORAL | Status: DC | PRN

## 2017-09-20 MED ORDER — BUPIVACAINE HCL (PF) 0.25 % IJ SOLN
INTRAMUSCULAR | Status: DC | PRN
  Administered 2017-09-20: 10 mL

## 2017-09-20 MED ORDER — LORAZEPAM 2 MG/ML IJ SOLN
0.5000 mg | INTRAMUSCULAR | Status: DC | PRN
  Administered 2017-09-20 – 2017-09-23 (×7): 0.5 mg via INTRAVENOUS
  Filled 2017-09-20 (×7): qty 1

## 2017-09-20 MED ORDER — NICOTINE 14 MG/24HR TD PT24
14.0000 mg | MEDICATED_PATCH | Freq: Every day | TRANSDERMAL | Status: DC
  Administered 2017-09-20 – 2017-09-24 (×5): 14 mg via TRANSDERMAL
  Filled 2017-09-20 (×5): qty 1

## 2017-09-20 MED ORDER — BISACODYL 10 MG RE SUPP: 10.0000 mg | Freq: Every day | RECTAL | Status: DC | PRN

## 2017-09-20 MED ORDER — VANCOMYCIN HCL IN DEXTROSE 1-5 GM/200ML-% IV SOLN
1000.0000 mg | Freq: Once | INTRAVENOUS | Status: AC
  Administered 2017-09-20: 1000 mg via INTRAVENOUS
  Filled 2017-09-20: qty 200

## 2017-09-20 MED ORDER — IBUPROFEN 400 MG PO TABS
400.0000 mg | ORAL_TABLET | Freq: Four times a day (QID) | ORAL | Status: DC | PRN
  Administered 2017-09-23: 400 mg via ORAL
  Filled 2017-09-20 (×2): qty 1

## 2017-09-20 MED ORDER — HYDRALAZINE HCL 20 MG/ML IJ SOLN: 5.0000 mg | Freq: Three times a day (TID) | INTRAMUSCULAR | Status: DC | PRN

## 2017-09-20 MED ORDER — DIPHENHYDRAMINE HCL 25 MG PO CAPS
25.0000 mg | ORAL_CAPSULE | Freq: Once | ORAL | Status: AC
  Administered 2017-09-20: 25 mg via ORAL
  Filled 2017-09-20: qty 1

## 2017-09-20 MED ORDER — NICOTINE POLACRILEX 2 MG MT GUM
2.0000 mg | CHEWING_GUM | OROMUCOSAL | Status: DC | PRN
  Administered 2017-09-20: 2 mg via ORAL
  Filled 2017-09-20 (×2): qty 1

## 2017-09-20 MED ORDER — HYDROMORPHONE HCL 1 MG/ML IJ SOLN
0.2500 mg | INTRAMUSCULAR | Status: DC | PRN
  Administered 2017-09-20 (×4): 0.5 mg via INTRAVENOUS

## 2017-09-20 MED ORDER — LIDOCAINE 2% (20 MG/ML) 5 ML SYRINGE
INTRAMUSCULAR | Status: DC | PRN
  Administered 2017-09-20: 100 mg via INTRAVENOUS

## 2017-09-20 MED ORDER — ONDANSETRON HCL 4 MG/2ML IJ SOLN: 4.0000 mg | Freq: Four times a day (QID) | INTRAMUSCULAR | Status: DC | PRN

## 2017-09-20 MED ORDER — MEPERIDINE HCL 50 MG/ML IJ SOLN: 6.2500 mg | INTRAMUSCULAR | Status: DC | PRN

## 2017-09-20 MED ORDER — TETANUS-DIPHTH-ACELL PERTUSSIS 5-2.5-18.5 LF-MCG/0.5 IM SUSP
0.5000 mL | Freq: Once | INTRAMUSCULAR | Status: AC
  Administered 2017-09-20: 0.5 mL via INTRAMUSCULAR
  Filled 2017-09-20: qty 0.5

## 2017-09-20 MED ORDER — FENTANYL CITRATE (PF) 250 MCG/5ML IJ SOLN
INTRAMUSCULAR | Status: DC | PRN
  Administered 2017-09-20: 100 ug via INTRAVENOUS
  Administered 2017-09-20 (×3): 50 ug via INTRAVENOUS

## 2017-09-20 MED ORDER — HYDROMORPHONE HCL 1 MG/ML IJ SOLN
INTRAMUSCULAR | Status: AC
  Filled 2017-09-20: qty 1

## 2017-09-20 MED ORDER — KETAMINE HCL 50 MG/5ML IJ SOSY
PREFILLED_SYRINGE | INTRAMUSCULAR | Status: AC
  Filled 2017-09-20: qty 5

## 2017-09-20 MED ORDER — DEXAMETHASONE SODIUM PHOSPHATE 10 MG/ML IJ SOLN
INTRAMUSCULAR | Status: DC | PRN
  Administered 2017-09-20: 10 mg via INTRAVENOUS

## 2017-09-20 MED ORDER — FENTANYL CITRATE (PF) 250 MCG/5ML IJ SOLN
INTRAMUSCULAR | Status: AC
  Filled 2017-09-20: qty 5

## 2017-09-20 MED ORDER — MIDAZOLAM HCL 2 MG/2ML IJ SOLN
INTRAMUSCULAR | Status: AC
  Filled 2017-09-20: qty 2

## 2017-09-20 MED ORDER — KETOROLAC TROMETHAMINE 30 MG/ML IJ SOLN
INTRAMUSCULAR | Status: AC
  Administered 2017-09-20: 30 mg
  Filled 2017-09-20: qty 1

## 2017-09-20 MED ORDER — SODIUM CHLORIDE 0.9 % IV BOLUS: 1000.0000 mL | Freq: Once | INTRAVENOUS | Status: DC

## 2017-09-20 MED ORDER — ONDANSETRON HCL 4 MG/2ML IJ SOLN: 4.0000 mg | Freq: Once | INTRAMUSCULAR | Status: DC | PRN

## 2017-09-20 MED ORDER — BUPIVACAINE HCL (PF) 0.25 % IJ SOLN
INTRAMUSCULAR | Status: AC
  Filled 2017-09-20: qty 30

## 2017-09-20 MED ORDER — VANCOMYCIN HCL IN DEXTROSE 750-5 MG/150ML-% IV SOLN
750.0000 mg | Freq: Three times a day (TID) | INTRAVENOUS | Status: DC
Start: 2017-09-20 — End: 2017-09-22
  Administered 2017-09-20 – 2017-09-22 (×7): 750 mg via INTRAVENOUS
  Filled 2017-09-20 (×7): qty 150

## 2017-09-20 MED ORDER — PROPOFOL 10 MG/ML IV BOLUS
INTRAVENOUS | Status: AC
  Filled 2017-09-20: qty 20

## 2017-09-20 MED ORDER — PIPERACILLIN-TAZOBACTAM 3.375 G IVPB
3.3750 g | Freq: Three times a day (TID) | INTRAVENOUS | Status: DC
  Administered 2017-09-20 – 2017-09-21 (×4): 3.375 g via INTRAVENOUS
  Filled 2017-09-20 (×3): qty 50

## 2017-09-20 MED ORDER — HYDROCODONE-ACETAMINOPHEN 5-325 MG PO TABS
ORAL_TABLET | ORAL | Status: AC
  Filled 2017-09-20: qty 2

## 2017-09-20 MED ORDER — QUETIAPINE FUMARATE 100 MG PO TABS
100.0000 mg | ORAL_TABLET | Freq: Every day | ORAL | Status: DC
  Administered 2017-09-20 – 2017-09-23 (×4): 100 mg via ORAL
  Filled 2017-09-20 (×5): qty 1

## 2017-09-20 MED ORDER — ACETAMINOPHEN 325 MG PO TABS: 650.0000 mg | ORAL_TABLET | Freq: Four times a day (QID) | ORAL | Status: DC | PRN

## 2017-09-20 MED ORDER — SODIUM CHLORIDE 0.9 % IV SOLN
INTRAVENOUS | Status: DC
  Administered 2017-09-20 – 2017-09-22 (×3): via INTRAVENOUS

## 2017-09-20 MED ORDER — DIPHENHYDRAMINE HCL 50 MG/ML IJ SOLN
INTRAMUSCULAR | Status: DC | PRN
  Administered 2017-09-20: 12.5 mg via INTRAVENOUS

## 2017-09-20 MED ORDER — KETOROLAC TROMETHAMINE 30 MG/ML IJ SOLN
30.0000 mg | Freq: Four times a day (QID) | INTRAMUSCULAR | Status: DC | PRN
  Administered 2017-09-20 – 2017-09-24 (×9): 30 mg via INTRAVENOUS
  Filled 2017-09-20 (×9): qty 1

## 2017-09-20 MED ORDER — SENNOSIDES-DOCUSATE SODIUM 8.6-50 MG PO TABS: 1.0000 | ORAL_TABLET | Freq: Every evening | ORAL | Status: DC | PRN

## 2017-09-20 MED ORDER — ONDANSETRON HCL 4 MG/2ML IJ SOLN
INTRAMUSCULAR | Status: DC | PRN
  Administered 2017-09-20: 4 mg via INTRAVENOUS

## 2017-09-20 MED ORDER — PROPOFOL 10 MG/ML IV BOLUS
INTRAVENOUS | Status: DC | PRN
  Administered 2017-09-20: 120 mg via INTRAVENOUS

## 2017-09-20 MED ORDER — MIDAZOLAM HCL 2 MG/2ML IJ SOLN
INTRAMUSCULAR | Status: DC | PRN
  Administered 2017-09-20: 2 mg via INTRAVENOUS

## 2017-09-20 MED ORDER — QUETIAPINE FUMARATE 50 MG PO TABS: 25.0000 mg | ORAL_TABLET | Freq: Every day | ORAL | Status: DC

## 2017-09-20 MED ORDER — SODIUM CHLORIDE 0.9 % IR SOLN
Status: DC | PRN
  Administered 2017-09-20: 3000 mL

## 2017-09-20 MED ORDER — HYDROCODONE-ACETAMINOPHEN 5-325 MG PO TABS
1.0000 | ORAL_TABLET | ORAL | Status: DC | PRN
Start: 2017-09-20 — End: 2017-09-22
  Administered 2017-09-20 – 2017-09-21 (×5): 2 via ORAL
  Filled 2017-09-20 (×5): qty 2

## 2017-09-20 MED ORDER — PIPERACILLIN-TAZOBACTAM 3.375 G IVPB 30 MIN
3.3750 g | Freq: Once | INTRAVENOUS | Status: AC
  Administered 2017-09-20: 3.375 g via INTRAVENOUS
  Filled 2017-09-20: qty 50

## 2017-09-20 MED ORDER — LACTATED RINGERS IV SOLN
INTRAVENOUS | Status: DC | PRN
  Administered 2017-09-20: 08:00:00 via INTRAVENOUS

## 2017-09-20 MED ORDER — OXYCODONE HCL 5 MG PO TABS
5.0000 mg | ORAL_TABLET | Freq: Once | ORAL | Status: AC
  Administered 2017-09-20: 5 mg via ORAL
  Filled 2017-09-20: qty 1

## 2017-09-20 SURGICAL SUPPLY — 65 items
BANDAGE ACE 3X5.8 VEL STRL LF (GAUZE/BANDAGES/DRESSINGS) ×2 IMPLANT
BANDAGE ACE 4X5 VEL STRL LF (GAUZE/BANDAGES/DRESSINGS) ×2 IMPLANT
BANDAGE ELASTIC 4 VELCRO ST LF (GAUZE/BANDAGES/DRESSINGS) ×1 IMPLANT
BNDG CMPR 9X4 STRL LF SNTH (GAUZE/BANDAGES/DRESSINGS) ×1
BNDG COHESIVE 1X5 TAN STRL LF (GAUZE/BANDAGES/DRESSINGS) IMPLANT
BNDG CONFORM 2 STRL LF (GAUZE/BANDAGES/DRESSINGS) IMPLANT
BNDG ESMARK 4X9 LF (GAUZE/BANDAGES/DRESSINGS) ×2 IMPLANT
BNDG GAUZE ELAST 4 BULKY (GAUZE/BANDAGES/DRESSINGS) ×2 IMPLANT
CORDS BIPOLAR (ELECTRODE) ×2 IMPLANT
COVER SURGICAL LIGHT HANDLE (MISCELLANEOUS) ×2 IMPLANT
CUFF TOURNIQUET SINGLE 18IN (TOURNIQUET CUFF) ×2 IMPLANT
CUFF TOURNIQUET SINGLE 24IN (TOURNIQUET CUFF) IMPLANT
DRAIN PENROSE 1/4X12 LTX STRL (WOUND CARE) IMPLANT
DRAPE SURG 17X23 STRL (DRAPES) ×2 IMPLANT
DRESSING ADAPTIC 1/2  N-ADH (PACKING) ×1 IMPLANT
DRSG ADAPTIC 3X8 NADH LF (GAUZE/BANDAGES/DRESSINGS) ×2 IMPLANT
ELECT REM PT RETURN 9FT ADLT (ELECTROSURGICAL)
ELECTRODE REM PT RTRN 9FT ADLT (ELECTROSURGICAL) IMPLANT
GAUZE PACKING IODOFORM 1/4X15 (GAUZE/BANDAGES/DRESSINGS) ×1 IMPLANT
GAUZE SPONGE 4X4 12PLY STRL (GAUZE/BANDAGES/DRESSINGS) ×2 IMPLANT
GAUZE XEROFORM 1X8 LF (GAUZE/BANDAGES/DRESSINGS) ×2 IMPLANT
GAUZE XEROFORM 5X9 LF (GAUZE/BANDAGES/DRESSINGS) IMPLANT
GLOVE BIOGEL PI IND STRL 8.5 (GLOVE) ×1 IMPLANT
GLOVE BIOGEL PI INDICATOR 8.5 (GLOVE) ×1
GLOVE SURG ORTHO 8.0 STRL STRW (GLOVE) ×2 IMPLANT
GOWN STRL REUS W/ TWL LRG LVL3 (GOWN DISPOSABLE) ×3 IMPLANT
GOWN STRL REUS W/ TWL XL LVL3 (GOWN DISPOSABLE) ×1 IMPLANT
GOWN STRL REUS W/TWL LRG LVL3 (GOWN DISPOSABLE) ×6
GOWN STRL REUS W/TWL XL LVL3 (GOWN DISPOSABLE) ×2
HANDPIECE INTERPULSE COAX TIP (DISPOSABLE)
KIT BASIN OR (CUSTOM PROCEDURE TRAY) ×2 IMPLANT
KIT TURNOVER KIT B (KITS) ×2 IMPLANT
MANIFOLD NEPTUNE II (INSTRUMENTS) ×2 IMPLANT
NDL HYPO 25GX1X1/2 BEV (NEEDLE) IMPLANT
NEEDLE HYPO 25GX1X1/2 BEV (NEEDLE) IMPLANT
NS IRRIG 1000ML POUR BTL (IV SOLUTION) ×2 IMPLANT
PACK ORTHO EXTREMITY (CUSTOM PROCEDURE TRAY) ×2 IMPLANT
PAD ARMBOARD 7.5X6 YLW CONV (MISCELLANEOUS) ×4 IMPLANT
PAD CAST 4YDX4 CTTN HI CHSV (CAST SUPPLIES) ×1 IMPLANT
PADDING CAST ABS 6INX4YD NS (CAST SUPPLIES) ×1
PADDING CAST ABS COTTON 6X4 NS (CAST SUPPLIES) IMPLANT
PADDING CAST COTTON 4X4 STRL (CAST SUPPLIES) ×2
PADDING CAST SYNTHETIC 4 (CAST SUPPLIES) ×1
PADDING CAST SYNTHETIC 4X4 STR (CAST SUPPLIES) IMPLANT
SET CYSTO W/LG BORE CLAMP LF (SET/KITS/TRAYS/PACK) IMPLANT
SET HNDPC FAN SPRY TIP SCT (DISPOSABLE) IMPLANT
SOAP 2 % CHG 4 OZ (WOUND CARE) ×2 IMPLANT
SPLINT FIBERGLASS 3X35 (CAST SUPPLIES) ×1 IMPLANT
SPONGE LAP 18X18 X RAY DECT (DISPOSABLE) ×2 IMPLANT
SPONGE LAP 4X18 RFD (DISPOSABLE) ×3 IMPLANT
SUT ETHILON 4 0 PS 2 18 (SUTURE) IMPLANT
SUT ETHILON 5 0 P 3 18 (SUTURE)
SUT NYLON ETHILON 5-0 P-3 1X18 (SUTURE) IMPLANT
SUT PROLENE 3 0 PS 2 (SUTURE) ×2 IMPLANT
SUT SILK 2 0 (SUTURE) ×2
SUT SILK 2-0 18XBRD TIE 12 (SUTURE) IMPLANT
SWAB COLLECTION DEVICE MRSA (MISCELLANEOUS) ×2 IMPLANT
SWAB CULTURE ESWAB REG 1ML (MISCELLANEOUS) ×1 IMPLANT
SYR CONTROL 10ML LL (SYRINGE) IMPLANT
TOWEL OR 17X24 6PK STRL BLUE (TOWEL DISPOSABLE) ×2 IMPLANT
TOWEL OR 17X26 10 PK STRL BLUE (TOWEL DISPOSABLE) ×2 IMPLANT
TUBE CONNECTING 12X1/4 (SUCTIONS) ×2 IMPLANT
UNDERPAD 30X30 (UNDERPADS AND DIAPERS) ×2 IMPLANT
WATER STERILE IRR 1000ML POUR (IV SOLUTION) ×2 IMPLANT
YANKAUER SUCT BULB TIP NO VENT (SUCTIONS) ×2 IMPLANT

## 2017-09-20 NOTE — ED Provider Notes (Signed)
[facility] EMERGENCY DEPARTMENT Provider Note   CSN: [id number] Arrival date & time: 09/20/17  0142     History   Chief Complaint Chief Complaint  Patient presents with  . Allergic Reaction    HPI Jane Watkins is a 22 y.o. female.  The history is provided by the patient.  Allergic Reaction  Presenting symptoms: itching and rash   Presenting symptoms: no difficulty breathing   Presenting symptoms comment:  Injected right forearm Severity:  Moderate Prior allergic episodes:  No prior episodes Context: medications   Context comment:  Injected what she thought was heroin but got hives, and did not get high.   Relieved by:  Nothing Worsened by:  Nothing Ineffective treatments:  None tried Extremity Pain  This is a new problem. The current episode started more than 1 week ago. The problem occurs constantly. The problem has not changed since onset.Pertinent negatives include no chest pain, no abdominal pain, no headaches and no shortness of breath. Nothing aggravates the symptoms. Nothing relieves the symptoms. She has tried nothing for the symptoms. The treatment provided no relief.  IVDA injected left forearm with swelling warmth and redness.  No f/c/r.    Past Medical History:  Diagnosis Date  . Overdose     Patient Active Problem List   Diagnosis Date Noted  . Cellulitis of arm 09/20/2017  . Quadriceps weakness 06/18/2016  . TBI (traumatic brain injury) (HCC) 05/01/2016  . 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 Respiratory distress   . Glasgow coma scale total score 3-8 (HCC)   . Pneumonia of both lower lobes due to methicillin resistant Staphylococcus aureus (MRSA) (HCC)   . Acute pulmonary edema (HCC)   . Non-traumatic rhabdomyolysis   . Opioid abuse (HCC)   . Elevated troponin   . Acute respiratory failure with hypoxia (HCC)   . Drug overdose   . Elevated liver enzymes   . Encephalopathy acute   . Encephalopathy 04/19/2016  . Opiate  overdose (HCC) 04/19/2016    Past Surgical History:  Procedure Laterality Date  . APPENDECTOMY    . MOUTH SURGERY    . TONGUE SURGERY       OB History   None      Home Medications    Prior to Admission medications   Medication Sig Start Date End Date Taking? Authorizing Provider  clonazePAM (KLONOPIN) 0.5 MG tablet Take 1 tablet (0.5 mg total) by mouth 2 (two) times daily. 06/18/16  Yes Ranelle Oyster, MD  gabapentin (NEURONTIN) 300 MG capsule Take 300 mg by mouth 3 (three) times daily.   Yes [provider]  QUEtiapine (SEROQUEL) 100 MG tablet Take 100 mg by mouth at bedtime.   Yes [provider]  diclofenac (VOLTAREN) 75 MG EC tablet Take 1 tablet (75 mg total) by mouth 2 (two) times daily with a meal. Patient not taking: Reported on 09/20/2017 06/18/16   Ranelle Oyster, MD  FLUoxetine (PROZAC) 20 MG capsule Take 1 capsule (20 mg total) by mouth daily. Patient not taking: Reported on 09/20/2017 05/10/16   Angiulli, Mcarthur Rossetti, PA-C  Multiple Vitamin (MULTIVITAMIN WITH MINERALS) TABS tablet Take 1 tablet by mouth daily. Patient not taking: Reported on 09/20/2017 05/10/16   Angiulli, Mcarthur Rossetti, PA-C  QUEtiapine (SEROQUEL) 25 MG tablet Take 1 tablet (25 mg total) by mouth at bedtime. Patient not taking: Reported on 09/20/2017 06/18/16   Ranelle Oyster, MD    Family History Family History  Problem Relation Age of  Onset  . Anemia Father     Social History Social History   Tobacco Use  . Smoking status: Current Every Day Smoker    Packs/day: 1.00    Years: 4.00    Pack years: 4.00    Types: Cigarettes  . Smokeless tobacco: Never Used  Substance Use Topics  . Alcohol use: No  . Drug use: Yes    Types: IV    Comment: Heroin      Allergies   Other and Sulfa antibiotics   Review of Systems Review of Systems  Constitutional: Negative for fever.  Respiratory: Negative for shortness of breath.   Cardiovascular: Negative for chest pain.    Gastrointestinal: Negative for abdominal pain.  Musculoskeletal: Positive for arthralgias and joint swelling.  Skin: Positive for itching and rash.  Neurological: Negative for headaches.  All other systems reviewed and are negative.    Physical Exam Updated Vital Signs BP (!) 148/109 (BP Location: Right Arm)   Pulse (!) 145   Temp 98.2 F (36.8 C) (Oral)   Resp 18   SpO2 98%   Physical Exam  Constitutional: She is oriented to person, place, and time. She appears well-developed and well-nourished.  HENT:  Head: Normocephalic and atraumatic.  Mouth/Throat: Oropharynx is clear and moist. No oropharyngeal exudate.  Eyes: Pupils are equal, round, and reactive to light. Conjunctivae are normal.  Neck: Normal range of motion. Neck supple.  Cardiovascular: Normal rate, regular rhythm, normal heart sounds and intact distal pulses. Exam reveals no gallop and no friction rub.  No murmur heard. Pulmonary/Chest: Effort normal and breath sounds normal. No stridor. She has no wheezes. She has no rales.  Abdominal: Soft. Bowel sounds are normal. She exhibits no mass. There is no tenderness. There is no rebound and no guarding.  Musculoskeletal: She exhibits edema and tenderness.  Neurological: She is alert and oriented to person, place, and time. She displays normal reflexes.  Skin: Skin is warm and dry. Capillary refill takes less than 2 seconds. She is not diaphoretic. There is erythema.  Lacelike eruption of the right forearm  Swelling of the left elbow and antecubital fossa with track mark.  No stigmata of endocarditis  Psychiatric: She has a normal mood and affect.  3+ radial pulses   Nursing note and vitals reviewed.    ED Treatments / Results  Labs (all labs ordered are listed, but only abnormal results are displayed) Results for orders placed or performed during the hospital encounter of 09/20/17  I-stat chem 8, ed  Result Value Ref Range   Sodium 140 135 - 145 mmol/L    Potassium 5.0 3.5 - 5.1 mmol/L   Chloride 108 98 - 111 mmol/L   BUN 10 6 - 20 mg/dL   Creatinine, Ser 9.60 0.44 - 1.00 mg/dL   Glucose, Bld 454 (H) 70 - 99 mg/dL   Calcium, Ion 0.98 (L) 1.15 - 1.40 mmol/L   TCO2 22 22 - 32 mmol/L   Hemoglobin 14.6 12.0 - 15.0 g/dL   HCT 11.9 14.7 - 82.9 %  I-Stat Beta hCG blood, ED ([facility], [facility], [facility] only)  Result Value Ref Range   I-stat hCG, quantitative <5.0 <5 mIU/mL   Comment 3           Dg Elbow Complete Left  Result Date: 09/20/2017 CLINICAL DATA:  22 y/o F; right arm swollen with hives. Recently injected heroin to the right arm. EXAM: LEFT ELBOW - COMPLETE 3+ VIEW; LEFT FOREARM - 2 VIEW COMPARISON:  None. FINDINGS: Left elbow: There is no evidence of fracture, dislocation, or joint effusion. There is no evidence of arthropathy or other focal bone abnormality. Left forearm: There is no evidence of fracture, dislocation, or joint effusion. There is no evidence of arthropathy or other focal bone abnormality. IMPRESSION: No acute fracture or dislocation identified. No findings of osteomyelitis. Electronically Signed   By: Mitzi Hansen M.D.   On: 09/20/2017 05:41   Dg Forearm Left  Result Date: 09/20/2017 CLINICAL DATA:  22 y/o F; right arm swollen with hives. Recently injected heroin to the right arm. EXAM: LEFT ELBOW - COMPLETE 3+ VIEW; LEFT FOREARM - 2 VIEW COMPARISON:  None. FINDINGS: Left elbow: There is no evidence of fracture, dislocation, or joint effusion. There is no evidence of arthropathy or other focal bone abnormality. Left forearm: There is no evidence of fracture, dislocation, or joint effusion. There is no evidence of arthropathy or other focal bone abnormality. IMPRESSION: No acute fracture or dislocation identified. No findings of osteomyelitis. Electronically Signed   By: Mitzi Hansen M.D.   On: 09/20/2017 05:41   Dg Forearm Right  Result Date: 09/20/2017 CLINICAL DATA:  Shot up heroin 30 minutes ago, with acute onset  of right arm swelling and hives. EXAM: RIGHT FOREARM - 2 VIEW COMPARISON:  Right forearm radiographs performed 12/16/2015 FINDINGS: The radius and ulna appear intact. There is no evidence of fracture or dislocation. There is no evidence of osseous erosion. Soft tissue swelling about the forearm is difficult to fully characterize on radiograph. No elbow joint effusion is seen. The carpal rows appear grossly intact, and demonstrate normal alignment. No radiopaque foreign bodies are seen. IMPRESSION: No evidence of osseous erosion.  No radiopaque foreign bodies seen. Electronically Signed   By: Roanna Raider M.D.   On: 09/20/2017 05:54    Radiology Dg Elbow Complete Left  Result Date: 09/20/2017 CLINICAL DATA:  22 y/o F; right arm swollen with hives. Recently injected heroin to the right arm. EXAM: LEFT ELBOW - COMPLETE 3+ VIEW; LEFT FOREARM - 2 VIEW COMPARISON:  None. FINDINGS: Left elbow: There is no evidence of fracture, dislocation, or joint effusion. There is no evidence of arthropathy or other focal bone abnormality. Left forearm: There is no evidence of fracture, dislocation, or joint effusion. There is no evidence of arthropathy or other focal bone abnormality. IMPRESSION: No acute fracture or dislocation identified. No findings of osteomyelitis. Electronically Signed   By: Mitzi Hansen M.D.   On: 09/20/2017 05:41   Dg Forearm Left  Result Date: 09/20/2017 CLINICAL DATA:  22 y/o F; right arm swollen with hives. Recently injected heroin to the right arm. EXAM: LEFT ELBOW - COMPLETE 3+ VIEW; LEFT FOREARM - 2 VIEW COMPARISON:  None. FINDINGS: Left elbow: There is no evidence of fracture, dislocation, or joint effusion. There is no evidence of arthropathy or other focal bone abnormality. Left forearm: There is no evidence of fracture, dislocation, or joint effusion. There is no evidence of arthropathy or other focal bone abnormality. IMPRESSION: No acute fracture or dislocation identified. No  findings of osteomyelitis. Electronically Signed   By: Mitzi Hansen M.D.   On: 09/20/2017 05:41   Dg Forearm Right  Result Date: 09/20/2017 CLINICAL DATA:  Shot up heroin 30 minutes ago, with acute onset of right arm swelling and hives. EXAM: RIGHT FOREARM - 2 VIEW COMPARISON:  Right forearm radiographs performed 12/16/2015 FINDINGS: The radius and ulna appear intact. There is no evidence of fracture or dislocation. There is  no evidence of osseous erosion. Soft tissue swelling about the forearm is difficult to fully characterize on radiograph. No elbow joint effusion is seen. The carpal rows appear grossly intact, and demonstrate normal alignment. No radiopaque foreign bodies are seen. IMPRESSION: No evidence of osseous erosion.  No radiopaque foreign bodies seen. Electronically Signed   By: Roanna RaiderJeffery  Chang M.D.   On: 09/20/2017 05:54    Procedures Procedures (including critical care time)  Medications Ordered in ED Medications  vancomycin (VANCOCIN) IVPB 1000 mg/200 mL premix (1,000 mg Intravenous New Bag/Given 09/20/17 16100638)  sodium chloride 0.9 % bolus 1,000 mL (1,000 mLs Intravenous New Bag/Given 09/20/17 0637)  Tdap (BOOSTRIX) injection 0.5 mL (has no administration in time range)  0.9 %  sodium chloride infusion (has no administration in time range)  acetaminophen (TYLENOL) tablet 650 mg (has no administration in time range)  ibuprofen (ADVIL,MOTRIN) tablet 400 mg (has no administration in time range)  ketorolac (TORADOL) 30 MG/ML injection 30 mg (has no administration in time range)  LORazepam (ATIVAN) injection 0.5 mg (has no administration in time range)  sodium chloride 0.9 % bolus 1,000 mL (has no administration in time range)  diphenhydrAMINE (BENADRYL) capsule 25 mg (25 mg Oral Given 09/20/17 0207)  piperacillin-tazobactam (ZOSYN) IVPB 3.375 g (0 g Intravenous Stopped 09/20/17 0631)  ketorolac (TORADOL) 30 MG/ML injection 30 mg (30 mg Intravenous Given 09/20/17 0600)      Case d/w Dr. Melvyn Novasrtmann will keep NPO for OR wash out  No visible abscess on US bedside Final Clinical Impressions(s) / ED Diagnoses   Final diagnoses:  Substance abuse (HCC)  Cellulitis, unspecified cellulitis site    Admit to medicine with hand consult    Kylieann Eagles, MD 09/20/17 [id number]

## 2017-09-20 NOTE — Anesthesia Postprocedure Evaluation (Addendum)
Anesthesia Post Note  Patient: Jane Watkins  Procedure(s) Performed: IRRIGATION AND DEBRIDEMENT LEFT ARM (Left Arm Upper)     Patient location during evaluation: PACU Anesthesia Type: General Level of consciousness: awake and alert Pain management: pain level controlled Vital Signs Assessment: post-procedure vital signs reviewed and stable Respiratory status: spontaneous breathing, nonlabored ventilation, respiratory function stable and patient connected to nasal cannula oxygen Cardiovascular status: blood pressure returned to baseline and stable Postop Assessment: no apparent nausea or vomiting Anesthetic complications: no Comments: Needed Ketamine in PACU for pain control secondary to narcotic tolerance.    Last Vitals:  Vitals:   09/20/17 1205 09/20/17 1215  BP:  (!) 95/56  Pulse:  72  Resp: 15 11  Temp:    SpO2:  98%    Last Pain:  Vitals:   09/20/17 1215  TempSrc:   PainSc: Asleep                 Lonia Roane DAVID

## 2017-09-20 NOTE — Op Note (Signed)
PREOPERATIVE DIAGNOSIS: Left elbow deep abscess Intravenous drug use, heroin  POSTOPERATIVE DIAGNOSIS: Same  ATTENDING SURGEON: Dr. Gilman SchmidtFred Ortman who was scrubbed and present for the entire procedure  ASSISTANT SURGEON: None  ANESTHESIA: Gen. via endotracheal tube  OPERATIVE PROCEDURE: #1: Incision and drainage of left elbow deep abscess #2: Left arm forearm fascia release, fasciotomy #3: Excision of portion of the cephalic vein, partial vein excision  IMPLANTS: None  RADIOGRAPHIC INTERPRETATION: None  SURGICAL INDICATIONS: Patient is a right-hand-dominant female who continues to use intravenous drugs was injecting into her left antecubital region. Patient presented with a worsening abscess. Risks benefits and alternatives were discussed in detail the patient and signed informed consent was obtained. Risks include but not limited to bleeding infection damage to nearby nerves arteries or tendons loss of motion of the wrists and digits incomplete relief of symptoms and need for further surgical intervention  SURGICAL TECHNIQUE: Patient is properly identified in the preoperative holding area marked for permanent marker made on the left elbow to indicate correct operative site. Patient brought back to operating room placed supine on anesthesia and table where general anesthesia was administered. Patient tolerated this well. Well-padded tourniquet was then placed on the left brachium and sealed with the appropriate drape. Left upper extremities and prepped and draped in normal sterile fashion. A timeout was called the correct site was identified and the procedure then begun. Attention was then turned to the left elbow. A C-type incision was then made transversely at the elbow crease and extended proximally and distally. Dissection carried down through the skin and subcutaneous tissue. The fascia and the arm was then released anteriorly. There did not appear to be any deep space infection tracking into  the arm after arm fasciotomy. Following this dissection was then carried out distally into the forearm with a large abscess area was then encountered around the elbow region. This is deep abscess tracking along between the interval between the brachialis and the brachia radialis. The wound was then thoroughly irrigated. The patient did have thrombosed and coagulated cephalic vein. The cephalic vein appear to be where she was using. This was then partially excised. It was tied off with silk sutures and vein excision was then carried out. The wound was then thoroughly irrigated. 3 L of solution were then run through the elbow copiously draining it. Wound cultures had been taken. The wound was then packed with iodoform packing gauze and closed with simple Prolene sutures. Adaptic dressing and a sterile compressive bandage then applied. Patient was then placed in a well-padded long-arm splint and extubated taken recovery room in good condition.  POSTOPERATIVE PLAN: Patient be admitted to the hospitalist service. See the patient back in the hospital in 24-48 hours for wound check intake packing out and change the packing. IV antibiotics by the internal medicine service continued follow him closely.

## 2017-09-20 NOTE — Progress Notes (Signed)
Pt placed on hold, no beds available to transfer

## 2017-09-20 NOTE — ED Notes (Signed)
Nurse starting IV and will draw labs. 

## 2017-09-20 NOTE — Care Management (Signed)
This is a no charge note  Pending admission per Dr. Nicanor AlconPalumbo  22 year old lady with past medical history of IVDU and overdose, who presents with arm cellulitis and hives after shoting up heroine. Hand surgeon, Dr. Melvyn Novasrtmann was consulted-->NPO, possible I&D.  Vancomycin and Zosyn were started in ED.  Patient is admitted to telemetry bed as inpatient.   Lorretta HarpXilin Ahilyn Nell, MD  [facility] Pager [phone]  If 7PM-7AM, please contact night-coverage [email or web address] Password [email or web address] 09/20/2017, 6:20 AM

## 2017-09-20 NOTE — Progress Notes (Signed)
Pt pain was too strong, Dilaudid was not working so called Dr. Michelle Piperssey to bedside and Ketamine administered.

## 2017-09-20 NOTE — Progress Notes (Signed)
Spoke with the patient at length, trying to convince her to stay to receive further care.  The patient refuses, she is in apparent drug withdrawal, she states that she was given Ativan, and "has not touch her "she wants to smoke outside of the facility, she refuses nicotine patch or gum.  In short, the patient is refusing any further care.  She wants to leave AMA.  She was instructed to return to the hospital if any symptoms of infection, fever, chills or other medical issues arise.  She also was advised to the see counseling as an outpatient.

## 2017-09-20 NOTE — ED Triage Notes (Signed)
Pt states that she shot up some heroin about 30 minutes ago and now her r arm is very swollen and broken out in hives, no hives any where else denies SOB. Pt also has abscess to L arm where she also used

## 2017-09-20 NOTE — Anesthesia Procedure Notes (Signed)
Procedure Name: Intubation Date/Time: 09/20/2017 8:14 AM Performed by: Clearnce Sorrel, CRNA Pre-anesthesia Checklist: Patient identified, Emergency Drugs available, Suction available, Patient being monitored and Timeout performed Patient Re-evaluated:Patient Re-evaluated prior to induction Oxygen Delivery Method: Circle system utilized Preoxygenation: Pre-oxygenation with 100% oxygen Induction Type: IV induction, Rapid sequence and Cricoid Pressure applied Laryngoscope Size: Mac and 3 Grade View: Grade I Tube type: Oral Tube size: 6.5 mm Number of attempts: 1 Airway Equipment and Method: Stylet Placement Confirmation: ETT inserted through vocal cords under direct vision,  positive ETCO2 and breath sounds checked- equal and bilateral Secured at: 22 cm Tube secured with: Tape Dental Injury: Teeth and Oropharynx as per pre-operative assessment

## 2017-09-20 NOTE — Transfer of Care (Signed)
Immediate Anesthesia Transfer of Care Note  Patient: Jane Watkins  Procedure(s) Performed: IRRIGATION AND DEBRIDEMENT LEFT ARM (Left Arm Upper)  Patient Location: PACU  Anesthesia Type:General  Level of Consciousness: awake, alert  and oriented  Airway & Oxygen Therapy: Patient Spontanous Breathing and Patient connected to nasal cannula oxygen  Post-op Assessment: Report given to RN and Post -op Vital signs reviewed and stable  Post vital signs: Reviewed and stable  Last Vitals:  Vitals Value Taken Time  BP 124/99 09/20/2017  9:13 AM  Temp    Pulse 102 09/20/2017  9:15 AM  Resp 17 09/20/2017  9:15 AM  SpO2 100 % 09/20/2017  9:15 AM  Vitals shown include unvalidated device data.  Last Pain:  Vitals:   09/20/17 0203  TempSrc: Oral  PainSc:          Complications: No apparent anesthesia complications

## 2017-09-20 NOTE — H&P (Signed)
History and Physical    Jane Watkins ZOX:[id number] DOB: [birth year] DOA: 09/20/2017   PCP: Patient, No Pcp Per   Patient coming from:  Home    Chief Complaint:  HPI: Jane Watkins is a 22 y.o. 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  The patient did not achieved getting high however.  She denies any shortness of breath, chest pain tongue swelling, face swelling, trouble swallowing, or any other rashes throughout her body.  She denies any chest pain or palpitations.  She denies any abdominal pain, nausea or vomiting, trouble urinating, or leg swelling.  She denies any gum or nosebleed.  She is on her menses at this time.  She reports generalized arthralgias.  She denies any headaches, or vision changes. The patient has reported "trouble to quit IV drug use ".  She has been using it for many years, and does it every day, sometimes more than once, mostly heroine including at work.  She is with her husband at the room, who also does IV drug use with her.  The patient also uses cocaine, last 2 days ago.  She did try detoxing on her own, but relapsed, last trial about 2 weeks ago.  She smokes half a pack a day of cigarettes.  She denies any alcohol abuse or marijuana.  She denies any history of HIV or hepatitis.  She is very tearful at this time.  ED Course:  BP 109/76   Pulse 83   Temp 98.2 F (36.8 C) (Oral)   Resp 18   SpO2 100%   CBC, and chemistries are unremarkable. hCG negative Left forearm without acute fracture or dislocation.  No osteomyelitis is noted. Right forearm negative for any findings Left elbow negative Ultrasound at bedside of the left forearm, does not appear to show  abscess, however hand surgery has been contacted, for evaluation, possible debridement in the area if needed. Patient received Boostrix, and was placed on Vancocin, and Zosyn. He also received IV pain medication, and generous IV fluids. Blood cultures are pending. Lactic acid is pending   Review of Systems:  As per HPI otherwise all other systems reviewed and are negative  Past Medical History:  Diagnosis Date  . Overdose     Past Surgical History:  Procedure Laterality Date  . APPENDECTOMY    . MOUTH SURGERY    . TONGUE SURGERY      Social History Social History   Socioeconomic History  . Marital status: Single    Spouse name: Not on file  . Number of children: Not on file  . Years of education: Not on file  . Highest education level: Not on file  Occupational History  . Not on file  Social Needs  . Financial resource strain: Not on file  . Food insecurity:    Worry: Not on file    Inability: Not on file  . Transportation needs:    Medical: Not on file    Non-medical: Not on file  Tobacco Use  . Smoking status: Current Every Day Smoker    Packs/day: 1.00    Years: 4.00    Pack years: 4.00    Types: Cigarettes  . Smokeless tobacco: Never Used  Substance and Sexual Activity  .  Alcohol use: No  . Drug use: Yes    Types: IV    Comment: Heroin   . Sexual activity: Not on file  Lifestyle  . Physical activity:    Days per week: Not on file    Minutes per session: Not on file  . Stress: Not on file  Relationships  . Social connections:    Talks on phone: Not on file    Gets together: Not on file    Attends religious service: Not on file    Active member of club or organization: Not on file    Attends meetings of clubs or organizations: Not on file    Relationship status: Not on file  . Intimate partner violence:    Fear of current or ex partner: Not on file    Emotionally abused: Not on file    Physically abused: Not on file    Forced sexual activity:  Not on file  Other Topics Concern  . Not on file  Social History Narrative  . Not on file     Allergies  Allergen Reactions  . Other Rash    Tide detergent   . Sulfa Antibiotics Rash    Family History  Problem Relation Age of Onset  . Anemia Father        Prior to Admission medications   Medication Sig Start Date End Date Taking? Authorizing Provider  clonazePAM (KLONOPIN) 0.5 MG tablet Take 1 tablet (0.5 mg total) by mouth 2 (two) times daily. 06/18/16  Yes Ranelle Oyster, MD  gabapentin (NEURONTIN) 300 MG capsule Take 300 mg by mouth 3 (three) times daily.   Yes [provider]  QUEtiapine (SEROQUEL) 100 MG tablet Take 100 mg by mouth at bedtime.   Yes [provider]  diclofenac (VOLTAREN) 75 MG EC tablet Take 1 tablet (75 mg total) by mouth 2 (two) times daily with a meal. Patient not taking: Reported on 09/20/2017 06/18/16   Ranelle Oyster, MD  FLUoxetine (PROZAC) 20 MG capsule Take 1 capsule (20 mg total) by mouth daily. Patient not taking: Reported on 09/20/2017 05/10/16   Angiulli, Mcarthur Rossetti, PA-C  Multiple Vitamin (MULTIVITAMIN WITH MINERALS) TABS tablet Take 1 tablet by mouth daily. Patient not taking: Reported on 09/20/2017 05/10/16   Angiulli, Mcarthur Rossetti, PA-C  QUEtiapine (SEROQUEL) 25 MG tablet Take 1 tablet (25 mg total) by mouth at bedtime. Patient not taking: Reported on 09/20/2017 06/18/16   Ranelle Oyster, MD     Physical Exam:  Vitals:   09/20/17 0203 09/20/17 0645  BP: (!) 148/109 109/76  Pulse: (!) 145 83  Resp: 18   Temp: 98.2 F (36.8 C)   TempSrc: Oral   SpO2: 98% 100%   Constitutional: NAD, calm, but tearful Eyes: PERRL, lids and conjunctivae normal ENMT: Mucous membranes are moist, without exudate or lesions  Neck: normal, supple, no masses, no thyromegaly.  Well-healed area of extubation Respiratory: clear to auscultation bilaterally, no wheezing, no crackles. Normal respiratory effort  Cardiovascular: Regular rate and  rhythm,  murmur, rubs or gallops. No extremity edema. 2+ pedal pulses. No carotid bruits.  Abdomen: Soft, non tender, No hepatosplenomegaly. Bowel sounds positive.  Musculoskeletal: no clubbing / cyanosis. Moves all extremities.  Tenderness in the left arm due to cellulitis Skin: no jaundice. There is an area of erythema, in the right forearm, with swelling up to the left elbow, and antecubital fossa, with track mark. Neurologic: Sensation intact  Strength equal in all extremities Psychiatric:  Alert and oriented x 3. Normal mood.     Labs on Admission: I have personally reviewed following labs and imaging studies  CBC: Recent Labs  Lab 09/20/17 0608 09/20/17 0609  WBC 16.7*  --   NEUTROABS PENDING  --   HGB 13.8 14.6  HCT 44.3 43.0  MCV 90.2  --   PLT PENDING  --     Basic Metabolic Panel: Recent Labs  Lab 09/20/17 0609  NA 140  K 5.0  CL 108  GLUCOSE 118*  BUN 10  CREATININE 0.50    GFR: CrCl cannot be calculated (Unknown ideal weight.).  Liver Function Tests: No results for input(s): AST, ALT, ALKPHOS, BILITOT, PROT, ALBUMIN in the last 168 hours. No results for input(s): LIPASE, AMYLASE in the last 168 hours. No results for input(s): AMMONIA in the last 168 hours.  Coagulation Profile: Recent Labs  Lab 09/20/17 0643  INR 1.14    Cardiac Enzymes: No results for input(s): CKTOTAL, CKMB, CKMBINDEX, TROPONINI in the last 168 hours.  BNP (last 3 results) No results for input(s): PROBNP in the last 8760 hours.  HbA1C: No results for input(s): HGBA1C in the last 72 hours.  CBG: No results for input(s): GLUCAP in the last 168 hours.  Lipid Profile: No results for input(s): CHOL, HDL, LDLCALC, TRIG, CHOLHDL, LDLDIRECT in the last 72 hours.  Thyroid Function Tests: No results for input(s): TSH, T4TOTAL, FREET4, T3FREE, THYROIDAB in the last 72 hours.  Anemia Panel: No results for input(s): VITAMINB12, FOLATE, FERRITIN, TIBC, IRON, RETICCTPCT in the  last 72 hours.  Urine analysis:    Component Value Date/Time   COLORURINE YELLOW 04/19/2016 1732   APPEARANCEUR HAZY (A) 04/19/2016 1732   LABSPEC 1.016 04/19/2016 1732   PHURINE 5.0 04/19/2016 1732   GLUCOSEU NEGATIVE 04/19/2016 1732   HGBUR LARGE (A) 04/19/2016 1732   BILIRUBINUR NEGATIVE 04/19/2016 1732   KETONESUR NEGATIVE 04/19/2016 1732   PROTEINUR NEGATIVE 04/19/2016 1732   NITRITE NEGATIVE 04/19/2016 1732   LEUKOCYTESUR NEGATIVE 04/19/2016 1732    Sepsis Labs: @LABRCNTIP (procalcitonin:4,lacticidven:4) )No results found for this or any previous visit (from the past 240 hour(s)).   Radiological Exams on Admission: Dg Elbow Complete Left  Result Date: 09/20/2017 CLINICAL DATA:  22 y/o F; right arm swollen with hives. Recently injected heroin to the right arm. EXAM: LEFT ELBOW - COMPLETE 3+ VIEW; LEFT FOREARM - 2 VIEW COMPARISON:  None. FINDINGS: Left elbow: There is no evidence of fracture, dislocation, or joint effusion. There is no evidence of arthropathy or other focal bone abnormality. Left forearm: There is no evidence of fracture, dislocation, or joint effusion. There is no evidence of arthropathy or other focal bone abnormality. IMPRESSION: No acute fracture or dislocation identified. No findings of osteomyelitis. Electronically Signed   By: Mitzi Hansen M.D.   On: 09/20/2017 05:41   Dg Forearm Left  Result Date: 09/20/2017 CLINICAL DATA:  22 y/o F; right arm swollen with hives. Recently injected heroin to the right arm. EXAM: LEFT ELBOW - COMPLETE 3+ VIEW; LEFT FOREARM - 2 VIEW COMPARISON:  None. FINDINGS: Left elbow: There is no evidence of fracture, dislocation, or joint effusion. There is no evidence of arthropathy or other focal bone abnormality. Left forearm: There is no evidence of fracture, dislocation, or joint effusion. There is no evidence of arthropathy or other focal bone abnormality. IMPRESSION: No acute fracture or dislocation identified. No  findings of osteomyelitis. Electronically Signed   By: Mitzi Hansen M.D.   On: 09/20/2017  05:41   Dg Forearm Right  Result Date: 09/20/2017 CLINICAL DATA:  Shot up heroin 30 minutes ago, with acute onset of right arm swelling and hives. EXAM: RIGHT FOREARM - 2 VIEW COMPARISON:  Right forearm radiographs performed 12/16/2015 FINDINGS: The radius and ulna appear intact. There is no evidence of fracture or dislocation. There is no evidence of osseous erosion. Soft tissue swelling about the forearm is difficult to fully characterize on radiograph. No elbow joint effusion is seen. The carpal rows appear grossly intact, and demonstrate normal alignment. No radiopaque foreign bodies are seen. IMPRESSION: No evidence of osseous erosion.  No radiopaque foreign bodies seen. Electronically Signed   By: Roanna RaiderJeffery  Chang M.D.   On: 09/20/2017 05:54    EKG: Independently reviewed.  Assessment/Plan Active Problems:   Cellulitis of arm   IV drug abuse (HCC)  Left arm Cellulitis in a patient with a history of IVDU. WBC normal at 7.2, Chemistries normal  Afebrile. Lactic acid pending. Blood cultures pending.  L forearm negative for osteo. U S bedside of L forearm does not appear to show abscess but hand surgery has been contacted, for evaluation, possible debridement/ washout in the area if needed.   Vanc and Zozyn initiated at the ED, Toradol for pain, tetanus injection, and pain medications. Hand surgery consulted, possible I+D today  Admit to telemetry IP  Cellulitis order set Follow blood cultures Lactic acid  Continue broad spectrum antibiotics with vancomycin and Zosyn  toradol for pain  IVF at 125 cc/h  Appreciate hand surgery evaluation.   History of IV drug abuse, with recurrent heroine overdose, last admission 12/2016 admitted after using heroin around 1 AM today, presenting with right arm swelling, and initial hives in the area of injection.  We had a long discussion with the patient, who  is receptive to try counseling once discharged from the hospital, as she states she cannot do it on her own.  Unfortunately, her home environment (her husband), is heroin addict as well, and she admits that they enable each other. CIWA protocol with Ativan  UDS  Patient may need evaluation by psychiatry once she is discharged from the hospital, and perhaps even checking in a  detox-rehab facilitty   DVT prophylaxis: SCDs for now until it is decided if the patient will undergo OR Code Status:    Full code Family Communication:  Discussed with patient her husband Disposition Plan: Expect patient to be discharged to home after condition improves Consults called:    Hand surgery per EDP Admission status: Inpatient telemetry   Marlowe KaysSara Zaryia Markel, PA-C [facility]   Amion text  [phone]   09/20/2017, 7:30 AM

## 2017-09-20 NOTE — Plan of Care (Signed)
  Problem: Clinical Measurements: Goal: Ability to maintain clinical measurements within normal limits will improve Outcome: Progressing   Problem: Coping: Goal: Level of anxiety will decrease Outcome: Progressing   Problem: Pain Managment: Goal: General experience of comfort will improve Outcome: Progressing   

## 2017-09-20 NOTE — Progress Notes (Signed)
Had lengthy conversations with the patient on about leaving AMA and staying in the hospital to receive treatment for her infection to left hand.  After discussion, the Pt has decided to stay in the hospital.  Pt is tearful at time due to the pain, but is willing to try and take the prescribed medication along with the Ativan to see if it controls her pain.  I told her that if need, the night nurse can call the covering MD.  Advised, the Pt she needs to keep her arm elevated and ice apply. Pt is resting and diet ordered

## 2017-09-20 NOTE — ED Notes (Signed)
Pt witnessed by [employer] leaving the waiting area, going outside without stating where she is going.

## 2017-09-20 NOTE — ED Notes (Signed)
Attempted to call report to floor 

## 2017-09-20 NOTE — Anesthesia Preprocedure Evaluation (Signed)
Anesthesia Evaluation  Patient identified by MRN, date of birth, ID band Patient awake    Reviewed: Allergy & Precautions, NPO status , Patient's Chart, lab work & pertinent test results  Airway Mallampati: I  TM Distance: >3 FB Neck ROM: Full    Dental   Pulmonary Current Smoker,    Pulmonary exam normal        Cardiovascular Normal cardiovascular exam     Neuro/Psych    GI/Hepatic   Endo/Other    Renal/GU      Musculoskeletal   Abdominal   Peds  Hematology   Anesthesia Other Findings   Reproductive/Obstetrics                             Anesthesia Physical Anesthesia Plan  ASA: III and emergent  Anesthesia Plan: General   Post-op Pain Management:    Induction: Intravenous  PONV Risk Score and Plan: 2  Airway Management Planned: Oral ETT  Additional Equipment:   Intra-op Plan:   Post-operative Plan: Extubation in OR  Informed Consent: I have reviewed the patients History and Physical, chart, labs and discussed the procedure including the risks, benefits and alternatives for the proposed anesthesia with the patient or authorized representative who has indicated his/her understanding and acceptance.     Plan Discussed with: CRNA and Surgeon  Anesthesia Plan Comments:         Anesthesia Quick Evaluation

## 2017-09-20 NOTE — Consult Note (Signed)
Reason for Consult:Left antecubital abscess Referring Physician: [name] is an 22 y.o. female. HPI: This is a new problem. The current episode started more than 1 week ago. The problem occurs constantly. The problem has not changed since onset.Pertinent negatives include no chest pain, no abdominal pain, no headaches and no shortness of breath. Nothing aggravates the symptoms. Nothing relieves the symptoms. She has tried nothing for the symptoms. The treatment provided no relief.  IVDA injected left forearm with swelling warmth and redness.  No f/c/r.    Past Medical History:  Diagnosis Date  . Overdose     Past Surgical History:  Procedure Laterality Date  . APPENDECTOMY    . MOUTH SURGERY    . TONGUE SURGERY      Family History  Problem Relation Age of Onset  . Anemia Father     Social History:  reports that she has been smoking cigarettes. She has a 4.00 pack-year smoking history. She has never used smokeless tobacco. She reports that she has current or past drug history. Drug: IV. She reports that she does not drink alcohol.  Allergies:  Allergies  Allergen Reactions  . Other Rash    Tide detergent   . Sulfa Antibiotics Rash    Medications: I have reviewed the patient's current medications.  Results for orders placed or performed during the hospital encounter of 09/20/17 (from the past 48 hour(s))  CBC with Differential/Platelet     Status: Abnormal   Collection Time: 09/20/17  6:08 AM  Result Value Ref Range   WBC 16.7 (H) 4.0 - 10.5 K/uL   RBC 4.91 3.87 - 5.11 MIL/uL   Hemoglobin 13.8 12.0 - 15.0 g/dL   HCT 29.5 62.1 - 30.8 %   MCV 90.2 78.0 - 100.0 fL   MCH 28.1 26.0 - 34.0 pg   MCHC 31.2 30.0 - 36.0 g/dL   RDW 65.7 84.6 - 96.2 %   Platelets 359 150 - 400 K/uL    Comment: REPEATED TO VERIFY PLATELET COUNT CONFIRMED BY SMEAR    Neutrophils Relative % 70 %   Lymphocytes Relative 21 %   Monocytes Relative 7 %   Eosinophils Relative 1 %    Basophils Relative 1 %   Neutro Abs 11.6 (H) 1.7 - 7.7 K/uL   Lymphs Abs 3.5 0.7 - 4.0 K/uL   Monocytes Absolute 1.2 (H) 0.1 - 1.0 K/uL   Eosinophils Absolute 0.2 0.0 - 0.7 K/uL   Basophils Absolute 0.2 (H) 0.0 - 0.1 K/uL   RBC Morphology OVAL MACROCYTES     Comment: Performed at [facility], [address], [place], [place] [address]  I-Stat Beta hCG blood, ED ([facility], [facility], [facility] only)     Status: None   Collection Time: 09/20/17  6:08 AM  Result Value Ref Range   I-stat hCG, quantitative <5.0 <5 mIU/mL   Comment 3            Comment:   GEST. AGE      CONC.  (mIU/mL)   <=1 WEEK        5 - 50     2 WEEKS       50 - 500     3 WEEKS       100 - 10,000     4 WEEKS     1,000 - 30,000        FEMALE AND NON-PREGNANT FEMALE:     LESS THAN 5 mIU/mL  I-stat chem 8, ed     Status: Abnormal   Collection Time: 09/20/17  6:09 AM  Result Value Ref Range   Sodium 140 135 - 145 mmol/L   Potassium 5.0 3.5 - 5.1 mmol/L   Chloride 108 98 - 111 mmol/L   BUN 10 6 - 20 mg/dL   Creatinine, Ser 1.61 0.44 - 1.00 mg/dL   Glucose, Bld 096 (H) 70 - 99 mg/dL   Calcium, Ion 0.45 (L) 1.15 - 1.40 mmol/L   TCO2 22 22 - 32 mmol/L   Hemoglobin 14.6 12.0 - 15.0 g/dL   HCT 40.9 81.1 - 91.4 %  Lactic acid, plasma     Status: None   Collection Time: 09/20/17  6:43 AM  Result Value Ref Range   Lactic Acid, Venous 1.5 0.5 - 1.9 mmol/L    Comment: Performed at [facility], [address] [place], [place] [address]  Protime-INR     Status: None   Collection Time: 09/20/17  6:43 AM  Result Value Ref Range   Prothrombin Time 14.5 11.4 - 15.2 seconds   INR 1.14     Comment: Performed at [facility], [address], [place], [place] [address]  APTT     Status: None   Collection Time: 09/20/17  6:43 AM  Result Value Ref Range   aPTT 32 24 - 36 seconds    Comment: Performed at [facility], [address], [place], [place] [address]  Urinalysis, Routine w reflex microscopic     Status: Abnormal    Collection Time: 09/20/17  7:31 AM  Result Value Ref Range   Color, Urine COLORLESS (A) YELLOW   APPearance CLEAR CLEAR   Specific Gravity, Urine 1.003 (L) 1.005 - 1.030   pH 6.0 5.0 - 8.0   Glucose, UA NEGATIVE NEGATIVE mg/dL   Hgb urine dipstick NEGATIVE NEGATIVE   Bilirubin Urine NEGATIVE NEGATIVE   Ketones, ur NEGATIVE NEGATIVE mg/dL   Protein, ur NEGATIVE NEGATIVE mg/dL   Nitrite NEGATIVE NEGATIVE   Leukocytes, UA NEGATIVE NEGATIVE    Comment: Performed at [facility], [address], [place], [place] [address]    Dg Elbow Complete Left  Result Date: 09/20/2017 CLINICAL DATA:  22 y/o F; right arm swollen with hives. Recently injected heroin to the right arm. EXAM: LEFT ELBOW - COMPLETE 3+ VIEW; LEFT FOREARM - 2 VIEW COMPARISON:  None. FINDINGS: Left elbow: There is no evidence of fracture, dislocation, or joint effusion. There is no evidence of arthropathy or other focal bone abnormality. Left forearm: There is no evidence of fracture, dislocation, or joint effusion. There is no evidence of arthropathy or other focal bone abnormality. IMPRESSION: No acute fracture or dislocation identified. No findings of osteomyelitis. Electronically Signed   By: Jane Watkins M.D.   On: 09/20/2017 05:41   Dg Forearm Left  Result Date: 09/20/2017 CLINICAL DATA:  22 y/o F; right arm swollen with hives. Recently injected heroin to the right arm. EXAM: LEFT ELBOW - COMPLETE 3+ VIEW; LEFT FOREARM - 2 VIEW COMPARISON:  None. FINDINGS: Left elbow: There is no evidence of fracture, dislocation, or joint effusion. There is no evidence of arthropathy or other focal bone abnormality. Left forearm: There is no evidence of fracture, dislocation, or joint effusion. There is no evidence of arthropathy or other focal bone abnormality. IMPRESSION: No acute fracture or dislocation identified. No findings of osteomyelitis. Electronically Signed   By: Jane Watkins.D.  On: 09/20/2017 05:41    Dg Forearm Right  Result Date: 09/20/2017 CLINICAL DATA:  Shot up heroin 30 minutes ago, with acute onset of right arm swelling and hives. EXAM: RIGHT FOREARM - 2 VIEW COMPARISON:  Right forearm radiographs performed 12/16/2015 FINDINGS: The radius and ulna appear intact. There is no evidence of fracture or dislocation. There is no evidence of osseous erosion. Soft tissue swelling about the forearm is difficult to fully characterize on radiograph. No elbow joint effusion is seen. The carpal rows appear grossly intact, and demonstrate normal alignment. No radiopaque foreign bodies are seen. IMPRESSION: No evidence of osseous erosion.  No radiopaque foreign bodies seen. Electronically Signed   By: Roanna RaiderJeffery  Chang M.D.   On: 09/20/2017 05:54    ROS AS NOTED IN HISTORY  Blood pressure 109/76, pulse 83, temperature 98.2 F (36.8 C), temperature source Oral, resp. rate 18, SpO2 100 %. Physical Exam  General Appearance:  Alert, cooperative, no distress, appears stated age  Head:  Normocephalic, without obvious abnormality, atraumatic  Eyes:  Pupils equal, conjunctiva/corneas clear,         Throat: s normal  Neck: No visible masses     Lungs:   respirations unlabored  Chest Wall:  No tenderness or deformity  Heart:  Regular rate and rhythm,  Abdomen:           Extremities: LUE: VERY TTP OVER LEFT ANTECUBITAL REGION, MARKS FROM  REPEATED IV DRUG USE LIMITED ELBOW MOBILITY FINGERS WARM WELL PERFUSED  Pulses:   Skin: Skin color, texture, turgor normal, no rashes or lesions     Neurologic: Normal    Assessment/Plan: HEROIN USE COCAINE USE LEFT ARM ABSCESS  LEFT ARM/ELBOW REGION INCISION AND DRAINAGE  WE ARE PLANNING SURGERY FOR YOUR UPPER EXTREMITY. THE RISKS AND BENEFITS OF SURGERY INCLUDE BUT NOT LIMITED TO BLEEDING INFECTION, DAMAGE TO NEARBY NERVES ARTERIES TENDONS, FAILURE OF SURGERY TO ACCOMPLISH ITS INTENDED GOALS, PERSISTENT SYMPTOMS AND NEED FOR FURTHER SURGICAL INTERVENTION.  WITH THIS IN MIND WE WILL PROCEED. I HAVE DISCUSSED WITH THE PATIENT THE PRE AND POSTOPERATIVE REGIMEN AND THE DOS AND DON'TS. PT VOICED UNDERSTANDING AND INFORMED CONSENT SIGNED.  Thomasene RippleFred W Mease Countryside Hospitalrtmann 09/20/2017, 8:12 AM

## 2017-09-20 NOTE — Progress Notes (Signed)
Pharmacy Antibiotic Note  Jane EvangelistLydia Watkins is a 22 y.o. female admitted on 09/20/2017 with L arm abscess.  Pt went to OR 8/11 for I&D.  Pharmacy has been consulted for Vancomycin and Zosyn dosing.  Received Zosyn 3.375gm x 1 at 0600 in ED Received Vancomycin 1gm x 1 at 0630 in ED  Plan: Vancomycin 750 IV every 8 hours.  Goal trough 10-15 mcg/mL.  Zosyn 3.375 gm IV q8h (4 hour infusion). Follow-up cx data, renal function, and clinical progress  Height: 5\' 2"  (157.5 cm) Weight: 128 lb 15.5 oz (58.5 kg) IBW/kg (Calculated) : 50.1  Temp (24hrs), Avg:97.9 F (36.6 C), Min:97.5 F (36.4 C), Max:98.2 F (36.8 C)  Recent Labs  Lab 09/20/17 0608 09/20/17 0609 09/20/17 0643  WBC 16.7*  --   --   CREATININE  --  0.50  --   LATICACIDVEN  --   --  1.5    Estimated Creatinine Clearance: 87.2 mL/min (by C-G formula based on SCr of 0.5 mg/dL).    Allergies  Allergen Reactions  . Other Rash    Tide detergent   . Sulfa Antibiotics Rash    Antimicrobials this admission: Vanc 8/11 >>  Zosyn 8/11 >>   Dose adjustments this admission:   Microbiology results:  Thank you for allowing pharmacy to be a part of this patient's care.  Jane 'R' UsKimberly Cagney Watkins, Pharm.D., BCPS Clinical Pharmacist  **Pharmacist phone directory can now be found on [email or web address] ([email or web address]).  Listed under [facility].  09/20/2017 4:03 PM

## 2017-09-20 NOTE — ED Notes (Signed)
Pt. witnessed back in waiting area at this time.

## 2017-09-21 ENCOUNTER — Other Ambulatory Visit: Payer: Self-pay

## 2017-09-21 ENCOUNTER — Encounter (HOSPITAL_COMMUNITY): Payer: Self-pay | Admitting: Orthopedic Surgery

## 2017-09-21 DIAGNOSIS — L02419 Cutaneous abscess of limb, unspecified: Secondary | ICD-10-CM

## 2017-09-21 DIAGNOSIS — F1910 Other psychoactive substance abuse, uncomplicated: Secondary | ICD-10-CM

## 2017-09-21 LAB — BASIC METABOLIC PANEL
Anion gap: 7 (ref 5–15)
BUN: 7 mg/dL (ref 6–20)
CO2: 22 mmol/L (ref 22–32)
CREATININE: 0.52 mg/dL (ref 0.44–1.00)
Calcium: 8.6 mg/dL — ABNORMAL LOW (ref 8.9–10.3)
Chloride: 114 mmol/L — ABNORMAL HIGH (ref 98–111)
GFR calc Af Amer: >60 mL/min (ref >60)
GFR calc non Af Amer: >60 mL/min (ref >60)
Glucose, Bld: 107 mg/dL — ABNORMAL HIGH (ref 70–99)
Potassium: 4 mmol/L (ref 3.5–5.1)
Sodium: 143 mmol/L (ref 135–145)

## 2017-09-21 LAB — CBC
HCT: 37.1 % (ref 36.0–46.0)
Hemoglobin: 11.7 g/dL — ABNORMAL LOW (ref 12.0–15.0)
MCH: 27.9 pg (ref 26.0–34.0)
MCHC: 31.5 g/dL (ref 30.0–36.0)
MCV: 88.5 fL (ref 78.0–100.0)
PLATELETS: 329 10*3/uL (ref 150–400)
RBC: 4.19 MIL/uL (ref 3.87–5.11)
RDW: 13.8 % (ref 11.5–15.5)
WBC: 11.3 10*3/uL — ABNORMAL HIGH (ref 4.0–10.5)

## 2017-09-21 LAB — HIV ANTIBODY (ROUTINE TESTING W REFLEX): HIV SCREEN 4TH GENERATION: NONREACTIVE

## 2017-09-21 LAB — MRSA PCR SCREENING: MRSA by PCR: NEGATIVE

## 2017-09-21 MED ORDER — ENSURE ENLIVE PO LIQD
237.0000 mL | Freq: Two times a day (BID) | ORAL | Status: DC
  Administered 2017-09-21 – 2017-09-22 (×2): 237 mL via ORAL

## 2017-09-21 MED ORDER — DICLOFENAC SODIUM 1 % TD GEL
2.0000 g | Freq: Four times a day (QID) | TRANSDERMAL | Status: DC
  Administered 2017-09-21 – 2017-09-24 (×10): 2 g via TOPICAL
  Filled 2017-09-21: qty 100

## 2017-09-21 MED ORDER — ENOXAPARIN SODIUM 40 MG/0.4ML ~~LOC~~ SOLN
40.0000 mg | SUBCUTANEOUS | Status: DC
  Administered 2017-09-21 – 2017-09-24 (×2): 40 mg via SUBCUTANEOUS
  Filled 2017-09-21 (×4): qty 0.4

## 2017-09-21 MED ORDER — GABAPENTIN 300 MG PO CAPS
300.0000 mg | ORAL_CAPSULE | Freq: Three times a day (TID) | ORAL | Status: DC
  Administered 2017-09-21 – 2017-09-24 (×10): 300 mg via ORAL
  Filled 2017-09-21 (×10): qty 1

## 2017-09-21 MED ORDER — PHENOL 1.4 % MT LIQD: 1.0000 | OROMUCOSAL | Status: DC | PRN

## 2017-09-21 MED ORDER — ACETAMINOPHEN 325 MG PO TABS
650.0000 mg | ORAL_TABLET | Freq: Four times a day (QID) | ORAL | Status: DC
  Administered 2017-09-21 – 2017-09-24 (×11): 650 mg via ORAL
  Filled 2017-09-21 (×12): qty 2

## 2017-09-21 NOTE — Progress Notes (Addendum)
Progress Note    Jane Watkins  RWE:[id number] DOB: 08/01/1995  DOA: 09/20/2017 PCP: Patient, No Pcp Per    Brief Narrative:     Medical records reviewed and are as summarized below:  Jane Watkins is an 22 y.o. female  presents with complications related to injection of heroin.  She has had previous episodes of heroin overdose.  s/p I&D by Dr. Melvyn Novasrtmann.  Admitted into the hospital for IV antibiotics.   Assessment/Plan:   Active Problems:   Cellulitis of arm   IV drug abuse (HCC)  Left arm Cellulitis and abscess in a patient with a history of IVDU -Vanc and Zozyn initiated at the ED -Toradol for pain, tetanus injection, and pain medications -Follow blood cultures -Dr. Melvyn Novasrtmann (hand surgery) -s/p: #1: Incision and drainage of left elbow deep abscess #2: Left arm forearm fascia release, fasciotomy #3: Excision of portion of the cephalic vein, partial vein excision -culture pending  leucocytosis -trending down  History of IV drug abuse, with recurrent heroin overdose -social work consult -after discussion with patient, it does not seem she is ready to commit to quiting  Family Communication/Anticipated D/C date and plan/Code Status   DVT prophylaxis: Lovenox ordered. Code Status: Full Code.  Family Communication:  Disposition Plan: pending improvement   Medical Consultants:    Hand surgery     Subjective:   C/o sore throat and chest tightness  Objective:    Vitals:   09/20/17 2000 09/21/17 0034 09/21/17 0430 09/21/17 1224  BP: 119/60 105/64 95/60 104/61  Pulse: 75 (!) 117 69 68  Resp: 18 18 18 16   Temp: 98.4 F (36.9 C) 99.1 F (37.3 C) 98.1 F (36.7 C) 98.6 F (37 C)  TempSrc: Oral Oral Oral Oral  SpO2: 100% 100% 100% 100%  Weight:      Height:        Intake/Output Summary (Last 24 hours) at 09/21/2017 1255 Last data filed at 09/21/2017 0526 Gross per 24 hour  Intake 1472.33 ml  Output -  Net 1472.33 ml   Filed Weights   09/20/17 1450    Weight: 58.5 kg    Exam: In bed, sleeping Left arm wrapped Clear breath sounds, equal, no increased work of breathing No LE edema A+OX3  Data Reviewed:   I have personally reviewed following labs and imaging studies:  Labs: Labs show the following:   Basic Metabolic Panel: Recent Labs  Lab 09/20/17 0609 09/21/17 0713  NA 140 143  K 5.0 4.0  CL 108 114*  CO2  --  22  GLUCOSE 118* 107*  BUN 10 7  CREATININE 0.50 0.52  CALCIUM  --  8.6*   GFR Estimated Creatinine Clearance: 87.2 mL/min (by C-G formula based on SCr of 0.52 mg/dL). Liver Function Tests: No results for input(s): AST, ALT, ALKPHOS, BILITOT, PROT, ALBUMIN in the last 168 hours. No results for input(s): LIPASE, AMYLASE in the last 168 hours. No results for input(s): AMMONIA in the last 168 hours. Coagulation profile Recent Labs  Lab 09/20/17 0643  INR 1.14    CBC: Recent Labs  Lab 09/20/17 0608 09/20/17 0609 09/21/17 0713  WBC 16.7*  --  11.3*  NEUTROABS 11.6*  --   --   HGB 13.8 14.6 11.7*  HCT 44.3 43.0 37.1  MCV 90.2  --  88.5  PLT 359  --  329   Cardiac Enzymes: No results for input(s): CKTOTAL, CKMB, CKMBINDEX, TROPONINI in the last 168 hours. BNP (last 3 results) No  results for input(s): PROBNP in the last 8760 hours. CBG: No results for input(s): GLUCAP in the last 168 hours. D-Dimer: No results for input(s): DDIMER in the last 72 hours. Hgb A1c: No results for input(s): HGBA1C in the last 72 hours. Lipid Profile: No results for input(s): CHOL, HDL, LDLCALC, TRIG, CHOLHDL, LDLDIRECT in the last 72 hours. Thyroid function studies: No results for input(s): TSH, T4TOTAL, T3FREE, THYROIDAB in the last 72 hours.  Invalid input(s): FREET3 Anemia work up: No results for input(s): VITAMINB12, FOLATE, FERRITIN, TIBC, IRON, RETICCTPCT in the last 72 hours. Sepsis Labs: Recent Labs  Lab 09/20/17 0608 09/20/17 0643 09/21/17 0713  WBC 16.7*  --  11.3*  LATICACIDVEN  --  1.5  --      Microbiology Recent Results (from the past 240 hour(s))  Blood culture (routine x 2)     Status: None (Preliminary result)   Collection Time: 09/20/17  5:55 AM  Result Value Ref Range Status   Specimen Description BLOOD LEFT HAND  Final   Special Requests   Final    BOTTLES DRAWN AEROBIC AND ANAEROBIC Blood Culture results may not be optimal due to an inadequate volume of blood received in culture bottles   Culture   Final    NO GROWTH 1 DAY Performed at [facility], [address], [place], [place] [address]    Report Status PENDING  Incomplete  Blood culture (routine x 2)     Status: None (Preliminary result)   Collection Time: 09/20/17  5:59 AM  Result Value Ref Range Status   Specimen Description BLOOD RIGHT ANTECUBITAL  Final   Special Requests   Final    BOTTLES DRAWN AEROBIC AND ANAEROBIC Blood Culture adequate volume   Culture   Final    NO GROWTH 1 DAY Performed at [facility], [address], [place], [place] [address]    Report Status PENDING  Incomplete  Aerobic/Anaerobic Culture (surgical/deep wound)     Status: None (Preliminary result)   Collection Time: 09/20/17  8:37 AM  Result Value Ref Range Status   Specimen Description ABSCESS LEFT ANTECUBITAL  Final   Special Requests NONE  Final   Gram Stain   Final    RARE WBC PRESENT,BOTH PMN AND MONONUCLEAR FEW GRAM POSITIVE COCCI IN PAIRS RARE GRAM VARIABLE ROD    Culture   Final    CULTURE REINCUBATED FOR BETTER GROWTH Performed at [facility], [address] [place], [place] [address]    Report Status PENDING  Incomplete    Procedures and diagnostic studies:  Dg Elbow Complete Left  Result Date: 09/20/2017 CLINICAL DATA:  22 y/o F; right arm swollen with hives. Recently injected heroin to the right arm. EXAM: LEFT ELBOW - COMPLETE 3+ VIEW; LEFT FOREARM - 2 VIEW COMPARISON:  None. FINDINGS: Left elbow: There is no evidence of fracture, dislocation, or joint effusion. There is no  evidence of arthropathy or other focal bone abnormality. Left forearm: There is no evidence of fracture, dislocation, or joint effusion. There is no evidence of arthropathy or other focal bone abnormality. IMPRESSION: No acute fracture or dislocation identified. No findings of osteomyelitis. Electronically Signed   By: Mitzi Hansen M.D.   On: 09/20/2017 05:41   Dg Forearm Left  Result Date: 09/20/2017 CLINICAL DATA:  22 y/o F; right arm swollen with hives. Recently injected heroin to the right arm. EXAM: LEFT ELBOW - COMPLETE 3+ VIEW; LEFT FOREARM - 2 VIEW COMPARISON:  None. FINDINGS: Left elbow: There is no evidence of fracture, dislocation, or joint effusion. There is no evidence of arthropathy or other focal bone abnormality. Left forearm: There is no evidence of fracture, dislocation, or joint effusion. There is no evidence of arthropathy or other focal bone abnormality. IMPRESSION: No acute fracture or dislocation identified. No findings of osteomyelitis. Electronically Signed   By: Mitzi HansenLance  Furusawa-Stratton M.D.   On: 09/20/2017 05:41   Dg Forearm Right  Result Date: 09/20/2017 CLINICAL DATA:  Shot up heroin 30 minutes ago, with acute onset of right arm swelling and hives. EXAM: RIGHT FOREARM - 2 VIEW COMPARISON:  Right forearm radiographs performed 12/16/2015 FINDINGS: The radius and ulna appear intact. There is no evidence of fracture or dislocation. There is no evidence of osseous erosion. Soft tissue swelling about the forearm is difficult to fully characterize on radiograph. No elbow joint effusion is seen. The carpal rows appear grossly intact, and demonstrate normal alignment. No radiopaque foreign bodies are seen. IMPRESSION: No evidence of osseous erosion.  No radiopaque foreign bodies seen. Electronically Signed   By: Roanna RaiderJeffery  Chang M.D.   On: 09/20/2017 05:54    Medications:   . acetaminophen  650 mg Oral Q6H  . diclofenac sodium  2 g Topical QID  . gabapentin  300 mg Oral  TID  . nicotine  14 mg Transdermal Daily  . QUEtiapine  100 mg Oral QHS   Continuous Infusions: . sodium chloride 100 mL/hr at 09/20/17 1800  . piperacillin-tazobactam (ZOSYN)  IV 3.375 g (09/21/17 0526)  . sodium chloride 984 mL/hr at 09/20/17 1800  . vancomycin 750 mg (09/21/17 0836)     LOS: 1 day   Joseph ArtJessica U Chidiebere Wynn  [facility]   *Please refer to [email or web address], password [email or web address] to get updated schedule on who will round on this patient, as hospitalists switch teams weekly. If 7PM-7AM, please contact night-coverage at [email or web address], password [email or web address] for any overnight needs.  09/21/2017, 12:55 PM

## 2017-09-22 DIAGNOSIS — L0291 Cutaneous abscess, unspecified: Secondary | ICD-10-CM

## 2017-09-22 LAB — BASIC METABOLIC PANEL
Anion gap: 9 (ref 5–15)
BUN: <5 mg/dL — ABNORMAL LOW (ref 6–20)
CO2: 21 mmol/L — ABNORMAL LOW (ref 22–32)
CREATININE: 0.53 mg/dL (ref 0.44–1.00)
Calcium: 8.5 mg/dL — ABNORMAL LOW (ref 8.9–10.3)
Chloride: 113 mmol/L — ABNORMAL HIGH (ref 98–111)
Glucose, Bld: 85 mg/dL (ref 70–99)
Potassium: 3.6 mmol/L (ref 3.5–5.1)
Sodium: 143 mmol/L (ref 135–145)

## 2017-09-22 LAB — CBC
HCT: 34 % — ABNORMAL LOW (ref 36.0–46.0)
HEMOGLOBIN: 10.6 g/dL — AB (ref 12.0–15.0)
MCH: 27.8 pg (ref 26.0–34.0)
MCHC: 31.2 g/dL (ref 30.0–36.0)
MCV: 89.2 fL (ref 78.0–100.0)
Platelets: 292 10*3/uL (ref 150–400)
RBC: 3.81 MIL/uL — ABNORMAL LOW (ref 3.87–5.11)
RDW: 13.9 % (ref 11.5–15.5)
WBC: 6.8 10*3/uL (ref 4.0–10.5)

## 2017-09-22 LAB — VANCOMYCIN, TROUGH: VANCOMYCIN TR: 11 ug/mL — AB (ref 15–20)

## 2017-09-22 MED ORDER — HYDROCODONE-ACETAMINOPHEN 5-325 MG PO TABS: 1.0000 | ORAL_TABLET | ORAL | Status: DC | PRN

## 2017-09-22 MED ORDER — VANCOMYCIN HCL IN DEXTROSE 750-5 MG/150ML-% IV SOLN
750.0000 mg | Freq: Three times a day (TID) | INTRAVENOUS | Status: DC
  Administered 2017-09-22 – 2017-09-24 (×5): 750 mg via INTRAVENOUS
  Filled 2017-09-22 (×5): qty 150

## 2017-09-22 MED ORDER — VANCOMYCIN HCL IN DEXTROSE 1-5 GM/200ML-% IV SOLN: 1000.0000 mg | Freq: Three times a day (TID) | INTRAVENOUS | Status: DC

## 2017-09-22 NOTE — Progress Notes (Signed)
Early in shift patient asked boyfriend to get something for her and he said, "I just don't want to be an enabler, but you will thank me for it someday, maybe." To which patient replied, "I doubt It." Boy friend did leave unit and come back though after the conversation. Patient groggy at this time. Awakens to touch with verbal stimuli but immediately goes back to sleep.

## 2017-09-22 NOTE — Progress Notes (Signed)
Nutrition Brief Note  Patient identified on the Malnutrition Screening Tool (MST) Report  Wt Readings from Last 15 Encounters:  09/20/17 58.5 kg  12/12/16 56.7 kg  05/20/16 49.4 kg  05/07/16 49.5 kg  05/01/16 51.6 kg  02/02/16 52.2 kg  12/16/15 52.2 kg  07/13/15 52.2 kg (24 %, Z= -0.72)*   * Growth percentiles are based on CDC (Girls, 2-20 Years) data.   22 year old female presents with complications related to injection of heroin.  She has had previous episodes of heroin overdose.  She will be evaluated by hand surgery for possible I&D or debridement.  Admitted into the hospital for IV antibiotics.  She may benefit from referral to a Suboxone clinic at [facility]   8/11- S/p I&D of lt elbow abscess,  drainage of left elbow deep abscess; Left arm forearm fascia release, fasciotomy; Excision of portion of the cephalic vein, partial vein excision  Pt receiving nursing care at time of visit. Per MD, pt very groggy today. Noted outside food (milkshake) on bedside table.   Reviewed wt hx; no wt loss noted.  Body mass index is 23.59 kg/m. Patient meets criteria for normal weight range based on current BMI.   Current diet order is regular, patient is consuming approximately n/a% of meals at this time. Labs and medications reviewed.   No nutrition interventions warranted at this time. If nutrition issues arise, please consult RD.   Shawniece Oyola A. Mayford KnifeWilliams, RD, LDN, CDE Pager: [phone] After hours Pager: [phone]

## 2017-09-22 NOTE — Progress Notes (Signed)
RN walked in room to find a medicine cup under the patient's cell phone. White oblong pill found in medicine cup. Different pill than meds administered earlier today. Pill appears to have been wet at one time. Nurse questioned patient about the pill. Pt. Responded, "it is a pill I got here at the hospital. I was just saving it to take when  I have really bad pain." RN reminded patient that pain meds have changed.This is not a pill given today. Encouraged to take meds when administered. Patient asked if she could take it now. RN replied, this is not ordered by the physician.And permission would not be given to take pill.  Medicine cup and pill removed from room and discarded.  Oncoming RN made aware.

## 2017-09-22 NOTE — Progress Notes (Signed)
Pharmacy Antibiotic Note  Jane EvangelistLydia Watkins is a 22 y.o. female admitted on 09/20/2017 with L arm abscess.  Pt went to OR 8/11 for I&D.    Continues on IV vancomycin Vancomycin trough today = 11 (therapeutic)  Plan: Continue Vancomycin 750 mg IV Q 8 hours Follow-up cx data, renal function, and clinical progress  Height: 5\' 2"  (157.5 cm) Weight: 128 lb 15.5 oz (58.5 kg) IBW/kg (Calculated) : 50.1  Temp (24hrs), Avg:98.3 F (36.8 C), Min:97.8 F (36.6 C), Max:98.7 F (37.1 C)  Recent Labs  Lab 09/20/17 0608 09/20/17 0609 09/20/17 0643 09/21/17 0713 09/22/17 0616 09/22/17 1502  WBC 16.7*  --   --  11.3* 6.8  --   CREATININE  --  0.50  --  0.52 0.53  --   LATICACIDVEN  --   --  1.5  --   --   --   VANCOTROUGH  --   --   --   --   --  11*    Estimated Creatinine Clearance: 87.2 mL/min (by C-G formula based on SCr of 0.53 mg/dL).    Allergies  Allergen Reactions  . Other Rash    Tide detergent   . Sulfa Antibiotics Rash    Thank you [name], PharmD [phone] 09/22/2017 4:52 PM
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
## 2017-09-22 NOTE — Progress Notes (Signed)
PLAN FOR WOUND CHANGE AND PACKING CHANGE IN AM SHOULD BE BY AROUND 8 AM TO LOOK AT WOUND WILL CONTINUE TO FOLLOW, IF WORSENS WILL NEED REPEAT I/D
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
## 2017-09-23 NOTE — Progress Notes (Addendum)
Subjective: 3 Days Post-Op Procedure(s) (LRB): IRRIGATION AND DEBRIDEMENT LEFT ARM (Left) Patient reports pain as 6 on 0-10 scale.   The patient is comfortable in bed.  She states that her pain is well-controlled with the pain medicine, She has been able to tolerate food and drink without difficulty.  She denies chest pain, shortness of breath, fever, chills, nausea, vomiting, diarrhea, or numbness and tingling in the hand.    Objective: Vital signs in last 24 hours: Temp:  [98.2 F (36.8 C)-99.3 F (37.4 C)] 98.3 F (36.8 C) (08/14 0531) Pulse Rate:  [44-81] 69 (08/14 0531) Resp:  [15-18] 18 (08/13 2327) BP: (117-148)/(82-98) 117/82 (08/14 0528) SpO2:  [99 %-100 %] 100 % (08/14 0531) Weight:  [59 kg] 59 kg (08/14 0531)  Intake/Output from previous day: 08/13 0701 - 08/14 0700 In: 220 [P.O.:120; IV Piggyback:100] Out: -  Intake/Output this shift: Total I/O In: 500 [P.O.:500] Out: -   Recent Labs    09/21/17 0713 09/22/17 0616  HGB 11.7* 10.6*   Recent Labs    09/21/17 0713 09/22/17 0616  WBC 11.3* 6.8  RBC 4.19 3.81*  HCT 37.1 34.0*  PLT 329 292   Recent Labs    09/21/17 0713 09/22/17 0616  NA 143 143  K 4.0 3.6  CL 114* 113*  CO2 22 21*  BUN 7 <5*  CREATININE 0.52 0.53  GLUCOSE 107* 85  CALCIUM 8.6* 8.5*   No results for input(s): LABPT, INR in the last 72 hours.  Sensation intact distally Intact pulses distally A&O x 4 Splint is clean, dry, and intact. There is moderate drainage on the dressing when removed.  No erythema or purulent drainage at the incision. The sutures are intact.  Able to wiggle all digits without difficulty.    Assessment/Plan: 3 Days Post-Op Procedure(s) (LRB): IRRIGATION AND DEBRIDEMENT LEFT ARM (Left) Advance diet The packing was removed and the dressing was changed today. Continue to keep the splint clean and dry. She will need wet to dry dressing changes daily.  Continue with pain management as needed.  Continue  with the current treatment plan  She will follow up with us in the office next week for wound check.    Karma GreaserSamantha Bonham Barton 09/23/2017, 11:37 AM

## 2017-09-23 NOTE — Progress Notes (Signed)
Progress Note    Jane Watkins  ZOX:[id number] DOB: 12/03/1995  DOA: 09/20/2017 PCP: Patient, No Pcp Per    Brief Narrative:     Medical records reviewed and are as summarized below:  Jane Watkins is an 22 y.o. female  presents with complications related to injection of heroin.  She has had previous episodes of heroin overdose.  s/p I&D by Dr. Melvyn Novasrtmann.  Admitted into the hospital for IV antibiotics.   Assessment/Plan:   Active Problems:   Cellulitis of arm   IV drug abuse (HCC)  Left arm Cellulitis and abscess in a patient with a history of IVDU -Vanc and Zozyn initiated at the ED- narrow to vanc only -Follow blood cultures -Dr. Melvyn Novasrtmann (hand surgery) -s/p: #1: Incision and drainage of left elbow deep abscess #2: Left arm forearm fascia release, fasciotomy #3: Excision of portion of the cephalic vein, partial vein excision -culture of wound still pending -per hand:  Continue to keep the splint clean and dry. She will need wet to dry dressing changes daily.  Continue with pain management as needed.  Continue with the current treatment plan  She will follow up with us in the office next week for wound check  leucocytosis -trending down  History of IV drug abuse, with recurrent heroin overdose -social work consult -after discussion with patient, it does not seem she is ready to commit to quitting -note from nursing questioning if patient's husband is bringing in something for her causing her to be groggy -nursing also commented on finding an unknown pill in patient's possession -d/c norco  Family Communication/Anticipated D/C date and plan/Code Status   DVT prophylaxis: Lovenox ordered. Code Status: Full Code.  Family Communication:  Disposition Plan: d/c in AM if culture back so abx can be de-escalated   Medical Consultants:    Hand surgery     Subjective:   sleeping  Objective:    Vitals:   09/23/17 0528 09/23/17 0531 09/23/17 1320 09/23/17 1331    BP: 117/82  (!) 105/59   Pulse: 81 69 (!) 58   Resp:   (!) 9 14  Temp:  98.3 F (36.8 C) 98.2 F (36.8 C)   TempSrc:  Oral Oral   SpO2: 100% 100% 100%   Weight:  59 kg    Height:        Intake/Output Summary (Last 24 hours) at 09/23/2017 1550 Last data filed at 09/23/2017 0939 Gross per 24 hour  Intake 720 ml  Output -  Net 720 ml   Filed Weights   09/20/17 1450 09/23/17 0531  Weight: 58.5 kg 59 kg    Exam: sleeping   Data Reviewed:   I have personally reviewed following labs and imaging studies:  Labs: Labs show the following:   Basic Metabolic Panel: Recent Labs  Lab 09/20/17 0609 09/21/17 0713 09/22/17 0616  NA 140 143 143  K 5.0 4.0 3.6  CL 108 114* 113*  CO2  --  22 21*  GLUCOSE 118* 107* 85  BUN 10 7 <5*  CREATININE 0.50 0.52 0.53  CALCIUM  --  8.6* 8.5*   GFR Estimated Creatinine Clearance: 87.2 mL/min (by C-G formula based on SCr of 0.53 mg/dL). Liver Function Tests: No results for input(s): AST, ALT, ALKPHOS, BILITOT, PROT, ALBUMIN in the last 168 hours. No results for input(s): LIPASE, AMYLASE in the last 168 hours. No results for input(s): AMMONIA in the last 168 hours. Coagulation profile Recent Labs  Lab 09/20/17 0643  INR  1.14    CBC: Recent Labs  Lab 09/20/17 0608 09/20/17 0609 09/21/17 0713 09/22/17 0616  WBC 16.7*  --  11.3* 6.8  NEUTROABS 11.6*  --   --   --   HGB 13.8 14.6 11.7* 10.6*  HCT 44.3 43.0 37.1 34.0*  MCV 90.2  --  88.5 89.2  PLT 359  --  329 292   Cardiac Enzymes: No results for input(s): CKTOTAL, CKMB, CKMBINDEX, TROPONINI in the last 168 hours. BNP (last 3 results) No results for input(s): PROBNP in the last 8760 hours. CBG: No results for input(s): GLUCAP in the last 168 hours. D-Dimer: No results for input(s): DDIMER in the last 72 hours. Hgb A1c: No results for input(s): HGBA1C in the last 72 hours. Lipid Profile: No results for input(s): CHOL, HDL, LDLCALC, TRIG, CHOLHDL, LDLDIRECT in the last  72 hours. Thyroid function studies: No results for input(s): TSH, T4TOTAL, T3FREE, THYROIDAB in the last 72 hours.  Invalid input(s): FREET3 Anemia work up: No results for input(s): VITAMINB12, FOLATE, FERRITIN, TIBC, IRON, RETICCTPCT in the last 72 hours. Sepsis Labs: Recent Labs  Lab 09/20/17 0608 09/20/17 0643 09/21/17 0713 09/22/17 0616  WBC 16.7*  --  11.3* 6.8  LATICACIDVEN  --  1.5  --   --     Microbiology Recent Results (from the past 240 hour(s))  Blood culture (routine x 2)     Status: None (Preliminary result)   Collection Time: 09/20/17  5:55 AM  Result Value Ref Range Status   Specimen Description BLOOD LEFT HAND  Final   Special Requests   Final    BOTTLES DRAWN AEROBIC AND ANAEROBIC Blood Culture results may not be optimal due to an inadequate volume of blood received in culture bottles   Culture   Final    NO GROWTH 3 DAYS Performed at [facility], [address] [place], [place] [address]    Report Status PENDING  Incomplete  Blood culture (routine x 2)     Status: None (Preliminary result)   Collection Time: 09/20/17  5:59 AM  Result Value Ref Range Status   Specimen Description BLOOD RIGHT ANTECUBITAL  Final   Special Requests   Final    BOTTLES DRAWN AEROBIC AND ANAEROBIC Blood Culture adequate volume   Culture   Final    NO GROWTH 3 DAYS Performed at [facility], [address] [place], [place] [address]    Report Status PENDING  Incomplete  Aerobic/Anaerobic Culture (surgical/deep wound)     Status: None (Preliminary result)   Collection Time: 09/20/17  8:37 AM  Result Value Ref Range Status   Specimen Description ABSCESS LEFT ANTECUBITAL  Final   Special Requests NONE  Final   Gram Stain   Final    RARE WBC PRESENT,BOTH PMN AND MONONUCLEAR FEW GRAM POSITIVE COCCI IN PAIRS RARE GRAM VARIABLE ROD    Culture   Final    CULTURE REINCUBATED FOR BETTER GROWTH HOLDING FOR POSSIBLE ANAEROBE Performed at [facility],  [address], [place], [place] [address]    Report Status PENDING  Incomplete  MRSA PCR Screening     Status: None   Collection Time: 09/21/17  2:49 PM  Result Value Ref Range Status   MRSA by PCR NEGATIVE NEGATIVE Final    Comment:        The GeneXpert MRSA Assay (FDA approved for NASAL specimens only), is one component of a comprehensive MRSA colonization surveillance program. It is  not intended to diagnose MRSA infection nor to guide or monitor treatment for MRSA infections. Performed at [facility], [address] [place], [place] [address]     Procedures and diagnostic studies:  No results found.  Medications:   . acetaminophen  650 mg Oral Q6H  . diclofenac sodium  2 g Topical QID  . enoxaparin (LOVENOX) injection  40 mg Subcutaneous Q24H  . gabapentin  300 mg Oral TID  . nicotine  14 mg Transdermal Daily  . QUEtiapine  100 mg Oral QHS   Continuous Infusions: . sodium chloride 984 mL/hr at 09/20/17 1800  . vancomycin 750 mg (09/23/17 0904)     LOS: 3 days   Joseph ArtJessica U Adar Rase  [facility]   *Please refer to [email or web address], password [email or web address] to get updated schedule on who will round on this patient, as hospitalists switch teams weekly. If 7PM-7AM, please contact night-coverage at [email or web address], password [email or web address] for any overnight needs.  09/23/2017, 3:50 PM

## 2017-09-24 DIAGNOSIS — L03119 Cellulitis of unspecified part of limb: Secondary | ICD-10-CM

## 2017-09-24 MED ORDER — ACETAMINOPHEN 325 MG PO TABS: 650.0000 mg | ORAL_TABLET | Freq: Four times a day (QID) | ORAL | Status: DC

## 2017-09-24 MED ORDER — AMOXICILLIN-POT CLAVULANATE 875-125 MG PO TABS: 1.0000 | ORAL_TABLET | Freq: Two times a day (BID) | ORAL | 0 refills | Status: DC

## 2017-09-24 MED ORDER — IBUPROFEN 800 MG PO TABS: 800.0000 mg | ORAL_TABLET | Freq: Three times a day (TID) | ORAL | 0 refills | Status: DC | PRN

## 2017-09-24 MED ORDER — AMOXICILLIN-POT CLAVULANATE 875-125 MG PO TABS
1.0000 | ORAL_TABLET | Freq: Two times a day (BID) | ORAL | Status: DC
  Administered 2017-09-24: 1 via ORAL
  Filled 2017-09-24: qty 1

## 2017-09-24 NOTE — Discharge Instructions (Signed)
Can take OTC lactobacillus while on antibiotics

## 2017-09-24 NOTE — Progress Notes (Signed)
Pt crying at night, wanting to go home.  Pt wants to speak with a doctor regarding care. Have lots of questions including wound culture results and current antibiotic, question about pain medicine. Pt wants to speak with a doctor about why she had to stay this long in the hospital.  Educated pt on current care. Will pass along to oncoming staff so questions will be addressed clearly.

## 2017-09-24 NOTE — Discharge Summary (Signed)
Physician Discharge Summary  Recardo EvangelistLydia Kumagai ZOX:[id number] DOB: 10/08/1995 DOA: 09/20/2017  PCP: Patient, No Pcp Per  Admit date: 09/20/2017 Discharge date: 09/24/2017  Admitted From: home Discharge disposition: home   Recommendations for Outpatient Follow-Up:   1. Wound care: wet to dry dressings-- keep splint clean--- follow up with Dr. Melvyn Novasrtmann in 1 week 2. suboxone information given- not yet ready to quit 3. Follow wound culture to final   Discharge Diagnosis:   Active Problems:   Cellulitis of arm   IV drug abuse [facility](HCC)    Discharge Condition: Improved.  Diet recommendation:   Regular.  Wound care: None.  Code status: Full.   History of Present Illness:   22 year old female presents with complications related to injection of heroin.  She has had previous episodes of heroin overdose.  She will be evaluated by hand surgery for possible I&D or debridement.  Admitted into the hospital for IV antibiotics.  She may benefit from referral to a Suboxone clinic at [place].   Hospital Course by Problem:   Left armCellulitis and abscessin a patient with a history of IVDU -Vanc and Zozyn initiated at the ED- narrow to vanc and now narrowed to augmentin-- per lab, culture shows skin bacteria -BC- NGTD -Dr. Melvyn Novasrtmann (hand surgery) -s/p: #1: Incision and drainage of left elbow deep abscess #2: Left arm forearm fascia release, fasciotomy #3:Excision of portion of the cephalic vein, partial vein excision -culture of wound-- final still pending -per hand:  Continue to keep the splint clean and dry. She will need wet to dry dressing changes daily. Continue with pain management as needed.  Continue with the current treatment plan She will follow up with us in the office next week for wound check  leucocytosis -trending down  History of IV drugabuse, with recurrent heroin overdose -social work consult -after discussion with patient, it does not seem she is ready to  commit to quitting -note from nursing questioning if patient's husband is bringing in something for her causing her to be groggy -nursing also commented on finding an unknown pill in patient's possession -d/c norco    Medical Consultants:   Hand surgery   Discharge Exam:   Vitals:   09/23/17 2132 09/24/17 0452  BP: 119/72 130/71  Pulse: 64 (!) 45  Resp: 18 16  Temp: 98.8 F (37.1 C) 97.7 F (36.5 C)  SpO2: 100% 99%   Vitals:   09/23/17 1320 09/23/17 1331 09/23/17 2132 09/24/17 0452  BP: (!) 105/59  119/72 130/71  Pulse: (!) 58  64 (!) 45  Resp: (!) 9 14 18 16   Temp: 98.2 F (36.8 C)  98.8 F (37.1 C) 97.7 F (36.5 C)  TempSrc: Oral  Oral Oral  SpO2: 100%  100% 99%  Weight:      Height:        General exam: Appears calm and comfortable. .    The results of significant diagnostics from this hospitalization (including imaging, microbiology, ancillary and laboratory) are listed below for reference.     Procedures and Diagnostic Studies:   Dg Elbow Complete Left  Result Date: 09/20/2017 CLINICAL DATA:  22 y/o F; right arm swollen with hives. Recently injected heroin to the right arm. EXAM: LEFT ELBOW - COMPLETE 3+ VIEW; LEFT FOREARM - 2 VIEW COMPARISON:  None. FINDINGS: Left elbow: There is no evidence of fracture, dislocation, or joint effusion. There is no evidence of arthropathy or other focal bone abnormality. Left forearm: There is no evidence  of fracture, dislocation, or joint effusion. There is no evidence of arthropathy or other focal bone abnormality. IMPRESSION: No acute fracture or dislocation identified. No findings of osteomyelitis. Electronically Signed   By: Mitzi Hansen M.D.   On: 09/20/2017 05:41   Dg Forearm Left  Result Date: 09/20/2017 CLINICAL DATA:  22 y/o F; right arm swollen with hives. Recently injected heroin to the right arm. EXAM: LEFT ELBOW - COMPLETE 3+ VIEW; LEFT FOREARM - 2 VIEW COMPARISON:  None. FINDINGS: Left elbow: There  is no evidence of fracture, dislocation, or joint effusion. There is no evidence of arthropathy or other focal bone abnormality. Left forearm: There is no evidence of fracture, dislocation, or joint effusion. There is no evidence of arthropathy or other focal bone abnormality. IMPRESSION: No acute fracture or dislocation identified. No findings of osteomyelitis. Electronically Signed   By: Mitzi Hansen M.D.   On: 09/20/2017 05:41   Dg Forearm Right  Result Date: 09/20/2017 CLINICAL DATA:  Shot up heroin 30 minutes ago, with acute onset of right arm swelling and hives. EXAM: RIGHT FOREARM - 2 VIEW COMPARISON:  Right forearm radiographs performed 12/16/2015 FINDINGS: The radius and ulna appear intact. There is no evidence of fracture or dislocation. There is no evidence of osseous erosion. Soft tissue swelling about the forearm is difficult to fully characterize on radiograph. No elbow joint effusion is seen. The carpal rows appear grossly intact, and demonstrate normal alignment. No radiopaque foreign bodies are seen. IMPRESSION: No evidence of osseous erosion.  No radiopaque foreign bodies seen. Electronically Signed   By: Roanna Raider M.D.   On: 09/20/2017 05:54     Labs:   Basic Metabolic Panel: Recent Labs  Lab 09/20/17 0609 09/21/17 0713 09/22/17 0616  NA 140 143 143  K 5.0 4.0 3.6  CL 108 114* 113*  CO2  --  22 21*  GLUCOSE 118* 107* 85  BUN 10 7 <5*  CREATININE 0.50 0.52 0.53  CALCIUM  --  8.6* 8.5*   GFR Estimated Creatinine Clearance: 87.2 mL/min (by C-G formula based on SCr of 0.53 mg/dL). Liver Function Tests: No results for input(s): AST, ALT, ALKPHOS, BILITOT, PROT, ALBUMIN in the last 168 hours. No results for input(s): LIPASE, AMYLASE in the last 168 hours. No results for input(s): AMMONIA in the last 168 hours. Coagulation profile Recent Labs  Lab 09/20/17 0643  INR 1.14    CBC: Recent Labs  Lab 09/20/17 0608 09/20/17 0609 09/21/17 0713  09/22/17 0616  WBC 16.7*  --  11.3* 6.8  NEUTROABS 11.6*  --   --   --   HGB 13.8 14.6 11.7* 10.6*  HCT 44.3 43.0 37.1 34.0*  MCV 90.2  --  88.5 89.2  PLT 359  --  329 292   Cardiac Enzymes: No results for input(s): CKTOTAL, CKMB, CKMBINDEX, TROPONINI in the last 168 hours. BNP: Invalid input(s): POCBNP CBG: No results for input(s): GLUCAP in the last 168 hours. D-Dimer No results for input(s): DDIMER in the last 72 hours. Hgb A1c No results for input(s): HGBA1C in the last 72 hours. Lipid Profile No results for input(s): CHOL, HDL, LDLCALC, TRIG, CHOLHDL, LDLDIRECT in the last 72 hours. Thyroid function studies No results for input(s): TSH, T4TOTAL, T3FREE, THYROIDAB in the last 72 hours.  Invalid input(s): FREET3 Anemia work up No results for input(s): VITAMINB12, FOLATE, FERRITIN, TIBC, IRON, RETICCTPCT in the last 72 hours. Microbiology Recent Results (from the past 240 hour(s))  Blood culture (routine x 2)  Status: None (Preliminary result)   Collection Time: 09/20/17  5:55 AM  Result Value Ref Range Status   Specimen Description BLOOD LEFT HAND  Final   Special Requests   Final    BOTTLES DRAWN AEROBIC AND ANAEROBIC Blood Culture results may not be optimal due to an inadequate volume of blood received in culture bottles   Culture   Final    NO GROWTH 3 DAYS Performed at [facility], [address] [place], [place] [address]    Report Status PENDING  Incomplete  Blood culture (routine x 2)     Status: None (Preliminary result)   Collection Time: 09/20/17  5:59 AM  Result Value Ref Range Status   Specimen Description BLOOD RIGHT ANTECUBITAL  Final   Special Requests   Final    BOTTLES DRAWN AEROBIC AND ANAEROBIC Blood Culture adequate volume   Culture   Final    NO GROWTH 3 DAYS Performed at [facility], [address] [place], [place] [address]    Report Status PENDING  Incomplete  Aerobic/Anaerobic Culture (surgical/deep wound)     Status:  None (Preliminary result)   Collection Time: 09/20/17  8:37 AM  Result Value Ref Range Status   Specimen Description ABSCESS LEFT ANTECUBITAL  Final   Special Requests NONE  Final   Gram Stain   Final    RARE WBC PRESENT,BOTH PMN AND MONONUCLEAR FEW GRAM POSITIVE COCCI IN PAIRS RARE GRAM VARIABLE ROD    Culture   Final    CULTURE REINCUBATED FOR BETTER GROWTH HOLDING FOR POSSIBLE ANAEROBE Performed at [facility], [address] [place], [place] [address]    Report Status PENDING  Incomplete  MRSA PCR Screening     Status: None   Collection Time: 09/21/17  2:49 PM  Result Value Ref Range Status   MRSA by PCR NEGATIVE NEGATIVE Final    Comment:        The GeneXpert MRSA Assay (FDA approved for NASAL specimens only), is one component of a comprehensive MRSA colonization surveillance program. It is not intended to diagnose MRSA infection nor to guide or monitor treatment for MRSA infections. Performed at [facility], [address] [place], [place] [address]      Discharge Instructions:   Discharge Instructions    Diet general   Complete by:  As directed    Discharge instructions   Complete by:  As directed    Continue to keep the splint clean and dry. wet to dry dressing changes daily.   Increase activity slowly   Complete by:  As directed      Allergies as of 09/24/2017      Reactions   Other Rash   Tide detergent    Sulfa Antibiotics Rash      Medication List    STOP taking these medications   clonazePAM 0.5 MG tablet Commonly known as:  KLONOPIN   diclofenac 75 MG EC tablet Commonly known as:  VOLTAREN   FLUoxetine 20 MG capsule Commonly known as:  PROZAC   multivitamin with minerals Tabs tablet     TAKE these medications   acetaminophen 325 MG tablet Commonly known as:  TYLENOL Take 2 tablets (650 mg total) by mouth every 6 (six) hours.   amoxicillin-clavulanate 875-125 MG tablet Commonly known as:  AUGMENTIN Take 1 tablet  by mouth every 12 (twelve) hours.   gabapentin 300 MG capsule Commonly known as:  NEURONTIN Take 300 mg  by mouth 3 (three) times daily.   ibuprofen 800 MG tablet Commonly known as:  ADVIL,MOTRIN Take 1 tablet (800 mg total) by mouth every 8 (eight) hours as needed for fever or moderate pain.   QUEtiapine 100 MG tablet Commonly known as:  SEROQUEL Take 100 mg by mouth at bedtime. What changed:  Another medication with the same name was removed. Continue taking this medication, and follow the directions you see here.      Follow-up Information    Bradly Bienenstockrtmann, Fred, MD Follow up in 1 week(s).   Specialty:  Orthopedic Surgery Why:  for wound check Contact information: [address] [place] [id number] [phone]            Time coordinating discharge: 35 min  Signed:  Joseph ArtJessica U Bluma Buresh  [facility] 09/24/2017, 9:18 AM

## 2017-09-24 NOTE — Progress Notes (Signed)
Seen by Dr. Benjamine MolaVann.Discharged home. Personal belongings,prescription,MD note and discharged instructions given to patient. Verbalized understanding of instructions

## 2017-09-25 LAB — CULTURE, BLOOD (ROUTINE X 2)
CULTURE: NO GROWTH
Culture: NO GROWTH
Special Requests: ADEQUATE

## 2017-09-25 LAB — AEROBIC/ANAEROBIC CULTURE (SURGICAL/DEEP WOUND): CULTURE: NORMAL

## 2017-09-25 LAB — AEROBIC/ANAEROBIC CULTURE W GRAM STAIN (SURGICAL/DEEP WOUND)

## 2019-02-07 ENCOUNTER — Emergency Department: Payer: 59

## 2019-02-07 ENCOUNTER — Inpatient Hospital Stay (HOSPITAL_COMMUNITY)
Admit: 2019-02-07 | Discharge: 2019-02-07 | Disposition: A | Discharge status: Home / Self Care | Payer: 59 | Attending: Neurology | Admitting: Neurology

## 2019-02-07 ENCOUNTER — Other Ambulatory Visit: Payer: Self-pay

## 2019-02-07 ENCOUNTER — Encounter: Payer: Self-pay | Admitting: Emergency Medicine

## 2019-02-07 ENCOUNTER — Inpatient Hospital Stay: Payer: 59

## 2019-02-07 ENCOUNTER — Inpatient Hospital Stay
Admission: EM | Admit: 2019-02-07 | Discharge: 2019-02-16 | DRG: 917 | Disposition: A | Discharge status: Rehabilitation Facility | Payer: 59 | Attending: Internal Medicine | Admitting: Internal Medicine

## 2019-02-07 DIAGNOSIS — F3342 Major depressive disorder, recurrent, in full remission: Secondary | ICD-10-CM | POA: Diagnosis present

## 2019-02-07 DIAGNOSIS — G43909 Migraine, unspecified, not intractable, without status migrainosus: Secondary | ICD-10-CM | POA: Diagnosis present

## 2019-02-07 DIAGNOSIS — F1510 Other stimulant abuse, uncomplicated: Secondary | ICD-10-CM | POA: Diagnosis present

## 2019-02-07 DIAGNOSIS — I63511 Cerebral infarction due to unspecified occlusion or stenosis of right middle cerebral artery: Secondary | ICD-10-CM | POA: Diagnosis present

## 2019-02-07 DIAGNOSIS — K219 Gastro-esophageal reflux disease without esophagitis: Secondary | ICD-10-CM | POA: Diagnosis present

## 2019-02-07 DIAGNOSIS — T50901A Poisoning by unspecified drugs, medicaments and biological substances, accidental (unintentional), initial encounter: Secondary | ICD-10-CM | POA: Diagnosis not present

## 2019-02-07 DIAGNOSIS — G9341 Metabolic encephalopathy: Secondary | ICD-10-CM | POA: Diagnosis not present

## 2019-02-07 DIAGNOSIS — Z832 Family history of diseases of the blood and blood-forming organs and certain disorders involving the immune mechanism: Secondary | ICD-10-CM

## 2019-02-07 DIAGNOSIS — Y92009 Unspecified place in unspecified non-institutional (private) residence as the place of occurrence of the external cause: Secondary | ICD-10-CM | POA: Diagnosis not present

## 2019-02-07 DIAGNOSIS — J9601 Acute respiratory failure with hypoxia: Secondary | ICD-10-CM | POA: Diagnosis present

## 2019-02-07 DIAGNOSIS — G92 Toxic encephalopathy: Secondary | ICD-10-CM | POA: Diagnosis present

## 2019-02-07 DIAGNOSIS — F1110 Opioid abuse, uncomplicated: Secondary | ICD-10-CM

## 2019-02-07 DIAGNOSIS — I6782 Cerebral ischemia: Secondary | ICD-10-CM | POA: Diagnosis present

## 2019-02-07 DIAGNOSIS — G8194 Hemiplegia, unspecified affecting left nondominant side: Secondary | ICD-10-CM | POA: Diagnosis present

## 2019-02-07 DIAGNOSIS — I63411 Cerebral infarction due to embolism of right middle cerebral artery: Secondary | ICD-10-CM | POA: Diagnosis not present

## 2019-02-07 DIAGNOSIS — F419 Anxiety disorder, unspecified: Secondary | ICD-10-CM | POA: Diagnosis present

## 2019-02-07 DIAGNOSIS — Z79899 Other long term (current) drug therapy: Secondary | ICD-10-CM

## 2019-02-07 DIAGNOSIS — I361 Nonrheumatic tricuspid (valve) insufficiency: Secondary | ICD-10-CM

## 2019-02-07 DIAGNOSIS — E875 Hyperkalemia: Secondary | ICD-10-CM | POA: Diagnosis not present

## 2019-02-07 DIAGNOSIS — R001 Bradycardia, unspecified: Secondary | ICD-10-CM | POA: Diagnosis present

## 2019-02-07 DIAGNOSIS — R404 Transient alteration of awareness: Secondary | ICD-10-CM | POA: Diagnosis present

## 2019-02-07 DIAGNOSIS — F17210 Nicotine dependence, cigarettes, uncomplicated: Secondary | ICD-10-CM | POA: Diagnosis present

## 2019-02-07 DIAGNOSIS — F0630 Mood disorder due to known physiological condition, unspecified: Secondary | ICD-10-CM | POA: Diagnosis not present

## 2019-02-07 DIAGNOSIS — Z20822 Contact with and (suspected) exposure to covid-19: Secondary | ICD-10-CM | POA: Diagnosis present

## 2019-02-07 DIAGNOSIS — F1210 Cannabis abuse, uncomplicated: Secondary | ICD-10-CM | POA: Diagnosis present

## 2019-02-07 DIAGNOSIS — Z9109 Other allergy status, other than to drugs and biological substances: Secondary | ICD-10-CM | POA: Diagnosis not present

## 2019-02-07 DIAGNOSIS — R748 Abnormal levels of other serum enzymes: Secondary | ICD-10-CM

## 2019-02-07 DIAGNOSIS — Z882 Allergy status to sulfonamides status: Secondary | ICD-10-CM | POA: Diagnosis not present

## 2019-02-07 DIAGNOSIS — F1990 Other psychoactive substance use, unspecified, uncomplicated: Secondary | ICD-10-CM

## 2019-02-07 DIAGNOSIS — F331 Major depressive disorder, recurrent, moderate: Secondary | ICD-10-CM | POA: Diagnosis present

## 2019-02-07 DIAGNOSIS — D72829 Elevated white blood cell count, unspecified: Secondary | ICD-10-CM | POA: Diagnosis present

## 2019-02-07 DIAGNOSIS — R1310 Dysphagia, unspecified: Secondary | ICD-10-CM | POA: Diagnosis not present

## 2019-02-07 DIAGNOSIS — F1910 Other psychoactive substance abuse, uncomplicated: Secondary | ICD-10-CM | POA: Diagnosis not present

## 2019-02-07 DIAGNOSIS — I639 Cerebral infarction, unspecified: Secondary | ICD-10-CM | POA: Diagnosis present

## 2019-02-07 DIAGNOSIS — J45909 Unspecified asthma, uncomplicated: Secondary | ICD-10-CM | POA: Diagnosis present

## 2019-02-07 DIAGNOSIS — T2007XA Burn of unspecified degree of neck, initial encounter: Secondary | ICD-10-CM | POA: Diagnosis present

## 2019-02-07 DIAGNOSIS — Z515 Encounter for palliative care: Secondary | ICD-10-CM | POA: Diagnosis not present

## 2019-02-07 DIAGNOSIS — F1921 Other psychoactive substance dependence, in remission: Secondary | ICD-10-CM

## 2019-02-07 DIAGNOSIS — T22051A Burn of unspecified degree of right shoulder, initial encounter: Secondary | ICD-10-CM | POA: Diagnosis present

## 2019-02-07 DIAGNOSIS — R233 Spontaneous ecchymoses: Secondary | ICD-10-CM | POA: Diagnosis present

## 2019-02-07 DIAGNOSIS — F1410 Cocaine abuse, uncomplicated: Secondary | ICD-10-CM | POA: Diagnosis present

## 2019-02-07 DIAGNOSIS — E785 Hyperlipidemia, unspecified: Secondary | ICD-10-CM | POA: Diagnosis not present

## 2019-02-07 DIAGNOSIS — R739 Hyperglycemia, unspecified: Secondary | ICD-10-CM | POA: Diagnosis present

## 2019-02-07 DIAGNOSIS — R29810 Facial weakness: Secondary | ICD-10-CM | POA: Diagnosis present

## 2019-02-07 DIAGNOSIS — T23001A Burn of unspecified degree of right hand, unspecified site, initial encounter: Secondary | ICD-10-CM | POA: Diagnosis present

## 2019-02-07 DIAGNOSIS — R29714 NIHSS score 14: Secondary | ICD-10-CM | POA: Diagnosis present

## 2019-02-07 DIAGNOSIS — F1111 Opioid abuse, in remission: Secondary | ICD-10-CM

## 2019-02-07 DIAGNOSIS — Z7189 Other specified counseling: Secondary | ICD-10-CM | POA: Diagnosis not present

## 2019-02-07 DIAGNOSIS — I6389 Other cerebral infarction: Secondary | ICD-10-CM | POA: Diagnosis not present

## 2019-02-07 DIAGNOSIS — I6330 Cerebral infarction due to thrombosis of unspecified cerebral artery: Secondary | ICD-10-CM | POA: Diagnosis not present

## 2019-02-07 LAB — CBC WITH DIFFERENTIAL/PLATELET
Abs Immature Granulocytes: 0.18 10*3/uL — ABNORMAL HIGH (ref 0.00–0.07)
Basophils Absolute: 0 10*3/uL (ref 0.0–0.1)
Basophils Relative: 0 %
Eosinophils Absolute: 0 10*3/uL (ref 0.0–0.5)
Eosinophils Relative: 0 %
HCT: 42.3 % (ref 36.0–46.0)
Hemoglobin: 13.8 g/dL (ref 12.0–15.0)
Immature Granulocytes: 1 %
Lymphocytes Relative: 3 %
Lymphs Abs: 0.7 10*3/uL (ref 0.7–4.0)
MCH: 28.3 pg (ref 26.0–34.0)
MCHC: 32.6 g/dL (ref 30.0–36.0)
MCV: 86.7 fL (ref 80.0–100.0)
Monocytes Absolute: 0.9 10*3/uL (ref 0.1–1.0)
Monocytes Relative: 4 %
Neutro Abs: 18.9 10*3/uL — ABNORMAL HIGH (ref 1.7–7.7)
Neutrophils Relative %: 92 %
Platelets: 277 10*3/uL (ref 150–400)
RBC: 4.88 MIL/uL (ref 3.87–5.11)
RDW: 16.3 % — ABNORMAL HIGH (ref 11.5–15.5)
WBC: 20.7 10*3/uL — ABNORMAL HIGH (ref 4.0–10.5)
nRBC: 0 % (ref 0.0–0.2)

## 2019-02-07 LAB — HIV ANTIBODY (ROUTINE TESTING W REFLEX): HIV Screen 4th Generation wRfx: NONREACTIVE

## 2019-02-07 LAB — URINE DRUG SCREEN, QUALITATIVE (ARMC ONLY)
Amphetamines, Ur Screen: NOT DETECTED
Barbiturates, Ur Screen: NOT DETECTED
Benzodiazepine, Ur Scrn: POSITIVE — AB
Cannabinoid 50 Ng, Ur ~~LOC~~: POSITIVE — AB
Cocaine Metabolite,Ur ~~LOC~~: POSITIVE — AB
MDMA (Ecstasy)Ur Screen: NOT DETECTED
Methadone Scn, Ur: NOT DETECTED
Opiate, Ur Screen: NOT DETECTED
Phencyclidine (PCP) Ur S: NOT DETECTED
Tricyclic, Ur Screen: NOT DETECTED

## 2019-02-07 LAB — COMPREHENSIVE METABOLIC PANEL
ALT: 58 U/L — ABNORMAL HIGH (ref 0–44)
AST: 31 U/L (ref 15–41)
Albumin: 4.6 g/dL (ref 3.5–5.0)
Alkaline Phosphatase: 83 U/L (ref 38–126)
Anion gap: 10 (ref 5–15)
BUN: 25 mg/dL — ABNORMAL HIGH (ref 6–20)
CO2: 20 mmol/L — ABNORMAL LOW (ref 22–32)
Calcium: 8.6 mg/dL — ABNORMAL LOW (ref 8.9–10.3)
Chloride: 110 mmol/L (ref 98–111)
Creatinine, Ser: 0.67 mg/dL (ref 0.44–1.00)
GFR calc Af Amer: >60 mL/min (ref >60)
GFR calc non Af Amer: >60 mL/min (ref >60)
Glucose, Bld: 199 mg/dL — ABNORMAL HIGH (ref 70–99)
Potassium: 5.2 mmol/L — ABNORMAL HIGH (ref 3.5–5.1)
Sodium: 140 mmol/L (ref 135–145)
Total Bilirubin: 0.9 mg/dL (ref 0.3–1.2)
Total Protein: 7.9 g/dL (ref 6.5–8.1)

## 2019-02-07 LAB — SEDIMENTATION RATE: Sed Rate: 3 mm/hr (ref 0–20)

## 2019-02-07 LAB — SARS CORONAVIRUS 2 (TAT 6-24 HRS): SARS Coronavirus 2: NEGATIVE

## 2019-02-07 LAB — URINALYSIS, COMPLETE (UACMP) WITH MICROSCOPIC
Bacteria, UA: NONE SEEN
Bilirubin Urine: NEGATIVE
Glucose, UA: >=500 mg/dL — AB
Hgb urine dipstick: NEGATIVE
Ketones, ur: NEGATIVE mg/dL
Leukocytes,Ua: NEGATIVE
Nitrite: NEGATIVE
Protein, ur: 100 mg/dL — AB
Specific Gravity, Urine: 1.015 (ref 1.005–1.030)
pH: 5 (ref 5.0–8.0)

## 2019-02-07 LAB — CK
Total CK: 418 U/L — ABNORMAL HIGH (ref 38–234)
Total CK: 4477 U/L — ABNORMAL HIGH (ref 38–234)

## 2019-02-07 LAB — BASIC METABOLIC PANEL
Anion gap: 7 (ref 5–15)
BUN: 22 mg/dL — ABNORMAL HIGH (ref 6–20)
CO2: 22 mmol/L (ref 22–32)
Calcium: 8 mg/dL — ABNORMAL LOW (ref 8.9–10.3)
Chloride: 111 mmol/L (ref 98–111)
Creatinine, Ser: 0.66 mg/dL (ref 0.44–1.00)
GFR calc Af Amer: >60 mL/min (ref >60)
GFR calc non Af Amer: >60 mL/min (ref >60)
Glucose, Bld: 100 mg/dL — ABNORMAL HIGH (ref 70–99)
Potassium: 4.9 mmol/L (ref 3.5–5.1)
Sodium: 140 mmol/L (ref 135–145)

## 2019-02-07 LAB — POCT PREGNANCY, URINE: Preg Test, Ur: NEGATIVE

## 2019-02-07 LAB — GLUCOSE, CAPILLARY
Glucose-Capillary: 69 mg/dL — ABNORMAL LOW (ref 70–99)
Glucose-Capillary: 72 mg/dL (ref 70–99)

## 2019-02-07 LAB — TROPONIN I (HIGH SENSITIVITY)
Troponin I (High Sensitivity): 62 ng/L — ABNORMAL HIGH (ref <18)
Troponin I (High Sensitivity): 119 ng/L (ref <18)

## 2019-02-07 LAB — LACTIC ACID, PLASMA: Lactic Acid, Venous: 1.3 mmol/L (ref 0.5–1.9)

## 2019-02-07 LAB — HEMOGLOBIN A1C
Hgb A1c MFr Bld: 5 % (ref 4.8–5.6)
Mean Plasma Glucose: 96.8 mg/dL

## 2019-02-07 LAB — ETHANOL: Alcohol, Ethyl (B): <10 mg/dL (ref <10)

## 2019-02-07 MED ORDER — NALOXONE HCL 2 MG/2ML IJ SOSY
0.4000 mg | PREFILLED_SYRINGE | INTRAMUSCULAR | Status: DC | PRN
  Filled 2019-02-07: qty 2

## 2019-02-07 MED ORDER — PIPERACILLIN-TAZOBACTAM 3.375 G IVPB 30 MIN
3.3750 g | Freq: Once | INTRAVENOUS | Status: AC
  Administered 2019-02-07: 3.375 g via INTRAVENOUS
  Filled 2019-02-07: qty 50

## 2019-02-07 MED ORDER — ENSURE ENLIVE PO LIQD: 237.0000 mL | Freq: Two times a day (BID) | ORAL | Status: DC

## 2019-02-07 MED ORDER — ASPIRIN 300 MG RE SUPP
300.0000 mg | Freq: Every day | RECTAL | Status: DC
  Administered 2019-02-07 – 2019-02-08 (×2): 300 mg via RECTAL
  Filled 2019-02-07 (×2): qty 1

## 2019-02-07 MED ORDER — VANCOMYCIN HCL IN DEXTROSE 1-5 GM/200ML-% IV SOLN
1000.0000 mg | Freq: Two times a day (BID) | INTRAVENOUS | Status: DC
  Administered 2019-02-07 – 2019-02-09 (×4): 1000 mg via INTRAVENOUS
  Filled 2019-02-07 (×5): qty 200

## 2019-02-07 MED ORDER — SODIUM CHLORIDE 0.9 % IV SOLN: INTRAVENOUS | Status: DC

## 2019-02-07 MED ORDER — ASPIRIN 300 MG RE SUPP: 300.0000 mg | Freq: Once | RECTAL | Status: DC

## 2019-02-07 MED ORDER — SODIUM CHLORIDE 0.9 % IV SOLN: Freq: Once | INTRAVENOUS | Status: AC

## 2019-02-07 MED ORDER — SENNOSIDES-DOCUSATE SODIUM 8.6-50 MG PO TABS: 1.0000 | ORAL_TABLET | Freq: Every evening | ORAL | Status: DC | PRN

## 2019-02-07 MED ORDER — IOHEXOL 350 MG/ML SOLN
75.0000 mL | Freq: Once | INTRAVENOUS | Status: AC | PRN
  Administered 2019-02-07: 75 mL via INTRAVENOUS

## 2019-02-07 MED ORDER — QUETIAPINE FUMARATE 25 MG PO TABS
100.0000 mg | ORAL_TABLET | Freq: Every day | ORAL | Status: DC
  Administered 2019-02-07 – 2019-02-15 (×7): 100 mg via ORAL
  Filled 2019-02-07 (×8): qty 4

## 2019-02-07 MED ORDER — PIPERACILLIN-TAZOBACTAM 3.375 G IVPB 30 MIN: 3.3750 g | Freq: Three times a day (TID) | INTRAVENOUS | Status: DC

## 2019-02-07 MED ORDER — NALOXONE HCL 2 MG/2ML IJ SOSY
PREFILLED_SYRINGE | INTRAMUSCULAR | Status: AC
  Administered 2019-02-07: 2 mg
  Filled 2019-02-07: qty 2

## 2019-02-07 MED ORDER — VANCOMYCIN HCL IN DEXTROSE 1-5 GM/200ML-% IV SOLN
1000.0000 mg | Freq: Once | INTRAVENOUS | Status: AC
  Administered 2019-02-07: 1000 mg via INTRAVENOUS
  Filled 2019-02-07: qty 200

## 2019-02-07 MED ORDER — ACETAMINOPHEN 325 MG PO TABS
650.0000 mg | ORAL_TABLET | ORAL | Status: DC | PRN
  Administered 2019-02-10 – 2019-02-16 (×4): 650 mg via ORAL
  Filled 2019-02-07 (×4): qty 2

## 2019-02-07 MED ORDER — STROKE: EARLY STAGES OF RECOVERY BOOK: Freq: Once | Status: DC

## 2019-02-07 MED ORDER — GADOBUTROL 1 MMOL/ML IV SOLN
5.0000 mL | Freq: Once | INTRAVENOUS | Status: AC | PRN
  Administered 2019-02-07: 5 mL via INTRAVENOUS

## 2019-02-07 MED ORDER — ENOXAPARIN SODIUM 40 MG/0.4ML ~~LOC~~ SOLN
40.0000 mg | SUBCUTANEOUS | Status: DC
  Administered 2019-02-07 – 2019-02-09 (×3): 40 mg via SUBCUTANEOUS
  Filled 2019-02-07 (×3): qty 0.4

## 2019-02-07 MED ORDER — ACETAMINOPHEN 160 MG/5ML PO SOLN
650.0000 mg | ORAL | Status: DC | PRN
  Filled 2019-02-07: qty 20.3

## 2019-02-07 MED ORDER — PIPERACILLIN-TAZOBACTAM 3.375 G IVPB
3.3750 g | Freq: Three times a day (TID) | INTRAVENOUS | Status: DC
  Administered 2019-02-07 – 2019-02-09 (×6): 3.375 g via INTRAVENOUS
  Filled 2019-02-07 (×6): qty 50

## 2019-02-07 MED ORDER — INSULIN ASPART 100 UNIT/ML ~~LOC~~ SOLN
0.0000 [IU] | SUBCUTANEOUS | Status: DC
  Administered 2019-02-07: 21:00:00 2 [IU] via SUBCUTANEOUS
  Filled 2019-02-07: qty 1

## 2019-02-07 MED ORDER — ACETAMINOPHEN 650 MG RE SUPP
650.0000 mg | RECTAL | Status: DC | PRN
  Administered 2019-02-08: 17:00:00 650 mg via RECTAL
  Filled 2019-02-07: qty 1

## 2019-02-07 NOTE — Progress Notes (Signed)
Patient IVC, 1:1 sitter at bedside. Madlyn Frankel, RN

## 2019-02-07 NOTE — ED Notes (Signed)
IVC for safety/ Pending further Medical Evaluation

## 2019-02-07 NOTE — ED Triage Notes (Signed)
EMS called to home, called by boyfriend.  Patient unresonsive, overdose, 2 mg Naran given.  C/O right arm pain.  Red areas to right hand and neck.

## 2019-02-07 NOTE — Progress Notes (Addendum)
Pharmacy Antibiotic Note  Jane Watkins is a 23 y.o. female admitted on 02/07/2019 with sepsis.  Pharmacy has been consulted for Vancomycin dosing. Pt also on Zosyn.   Vancomycin 1 g IV x1 given in the ED at 1232  Of note, called ED RN for height. RN stated pt is about 5'5". May need to recalculate dose if height is updated.   Plan: Vancomycin 1000 mg IV Q 12 hrs to start at 2000 as pt did not receive full loading dose. Goal AUC 400-550. Expected AUC: 547 SCr used: 0.8 Expected Cmin 14.2  (Also ran numbers with SCr 0.7 - AUC 482, Expected Cmin 11.6)  Will order SCr for AM to continue to monitor renal function as pt is also on Zosyn.   Weight: 130 lb 1.1 oz (59 kg)  Temp (24hrs), Avg:98.1 F (36.7 C), Min:98.1 F (36.7 C), Max:98.1 F (36.7 C)  Recent Labs  Lab 02/07/19 0938 02/07/19 1134  WBC 20.7*  --   CREATININE 0.67  --   LATICACIDVEN  --  1.3    CrCl cannot be calculated (Unknown ideal weight.).    Allergies  Allergen Reactions  . Other Rash    Tide detergent   . Sulfa Antibiotics Rash    Antimicrobials this admission: Vancomycin/Zosyn 12/28 >>  Dose adjustments this admission:   Microbiology results: 12/28 BCx: collected x2   Thank you for allowing pharmacy to be a part of this patient's care.  Rocky Morel 02/07/2019 1:50 PM
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
## 2019-02-07 NOTE — ED Notes (Signed)
MD aware of increasing trop

## 2019-02-07 NOTE — ED Notes (Signed)
RA SpO2 88-89%. 4l/ [place] placed on patient.

## 2019-02-07 NOTE — ED Notes (Signed)
1C called to see if there were any questions r/t handoff.  Will proceed with admission process.

## 2019-02-07 NOTE — ED Notes (Signed)
Patient transported to CT 
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
## 2019-02-07 NOTE — ED Provider Notes (Signed)
IMPRESSION:  Acute infarction in the right MCA territory affecting the basal  ganglia and affecting the cortical and subcortical brain in a  largely watershed distribution. Findings likely due to embolic  disease of the right middle cerebral artery. Mild swelling of the  affected areas with petechial blood products but no frank hematoma.  No abnormal enhancement to suggest septic emboli.   MRI findings as dictated above    Earleen Newport, MD 02/07/19 1241

## 2019-02-07 NOTE — Progress Notes (Signed)
*  PRELIMINARY RESULTS* Echocardiogram 2D Echocardiogram has been performed.  Jane Watkins 02/07/2019, 8:05 PM

## 2019-02-07 NOTE — ED Notes (Signed)
Patient transported to MRI 
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
## 2019-02-08 ENCOUNTER — Encounter: Payer: Self-pay | Admitting: Internal Medicine

## 2019-02-08 ENCOUNTER — Other Ambulatory Visit: Payer: Self-pay

## 2019-02-08 DIAGNOSIS — Z7189 Other specified counseling: Secondary | ICD-10-CM

## 2019-02-08 DIAGNOSIS — Z515 Encounter for palliative care: Secondary | ICD-10-CM

## 2019-02-08 LAB — CBC WITH DIFFERENTIAL/PLATELET
Abs Immature Granulocytes: 0.02 10*3/uL (ref 0.00–0.07)
Basophils Absolute: 0 10*3/uL (ref 0.0–0.1)
Basophils Relative: 0 %
Eosinophils Absolute: 0.1 10*3/uL (ref 0.0–0.5)
Eosinophils Relative: 1 %
HCT: 35.3 % — ABNORMAL LOW (ref 36.0–46.0)
Hemoglobin: 11.2 g/dL — ABNORMAL LOW (ref 12.0–15.0)
Immature Granulocytes: 0 %
Lymphocytes Relative: 31 %
Lymphs Abs: 2.9 10*3/uL (ref 0.7–4.0)
MCH: 28.9 pg (ref 26.0–34.0)
MCHC: 31.7 g/dL (ref 30.0–36.0)
MCV: 91.2 fL (ref 80.0–100.0)
Monocytes Absolute: 0.7 10*3/uL (ref 0.1–1.0)
Monocytes Relative: 8 %
Neutro Abs: 5.7 10*3/uL (ref 1.7–7.7)
Neutrophils Relative %: 60 %
Platelets: 212 10*3/uL (ref 150–400)
RBC: 3.87 MIL/uL (ref 3.87–5.11)
RDW: 17 % — ABNORMAL HIGH (ref 11.5–15.5)
WBC: 9.4 10*3/uL (ref 4.0–10.5)
nRBC: 0 % (ref 0.0–0.2)

## 2019-02-08 LAB — GLUCOSE, CAPILLARY
Glucose-Capillary: 75 mg/dL (ref 70–99)
Glucose-Capillary: 78 mg/dL (ref 70–99)
Glucose-Capillary: 85 mg/dL (ref 70–99)
Glucose-Capillary: 95 mg/dL (ref 70–99)
Glucose-Capillary: 98 mg/dL (ref 70–99)

## 2019-02-08 LAB — BASIC METABOLIC PANEL
Anion gap: 5 (ref 5–15)
BUN: 7 mg/dL (ref 6–20)
CO2: 24 mmol/L (ref 22–32)
Calcium: 8.4 mg/dL — ABNORMAL LOW (ref 8.9–10.3)
Chloride: 111 mmol/L (ref 98–111)
Creatinine, Ser: 0.56 mg/dL (ref 0.44–1.00)
GFR calc Af Amer: >60 mL/min (ref >60)
GFR calc non Af Amer: >60 mL/min (ref >60)
Glucose, Bld: 101 mg/dL — ABNORMAL HIGH (ref 70–99)
Potassium: 3.9 mmol/L (ref 3.5–5.1)
Sodium: 140 mmol/L (ref 135–145)

## 2019-02-08 LAB — LIPID PANEL
Cholesterol: 87 mg/dL (ref 0–200)
HDL: 35 mg/dL — ABNORMAL LOW (ref >40)
LDL Cholesterol: 35 mg/dL (ref 0–99)
Total CHOL/HDL Ratio: 2.5 RATIO
Triglycerides: 87 mg/dL (ref <150)
VLDL: 17 mg/dL (ref 0–40)

## 2019-02-08 LAB — CREATININE, SERUM
Creatinine, Ser: 0.48 mg/dL (ref 0.44–1.00)
GFR calc Af Amer: >60 mL/min (ref >60)
GFR calc non Af Amer: >60 mL/min (ref >60)

## 2019-02-08 MED ORDER — ADULT MULTIVITAMIN W/MINERALS CH
1.0000 | ORAL_TABLET | Freq: Every day | ORAL | Status: DC
  Administered 2019-02-09 – 2019-02-16 (×8): 1 via ORAL
  Filled 2019-02-08 (×8): qty 1

## 2019-02-08 MED ORDER — ENSURE ENLIVE PO LIQD
237.0000 mL | Freq: Three times a day (TID) | ORAL | Status: DC
  Administered 2019-02-10 – 2019-02-15 (×11): 237 mL via ORAL

## 2019-02-08 NOTE — Progress Notes (Signed)
Rehab Admissions Coordinator Note:  Per PT recommendation, this patient was screened by Raechel Ache for appropriateness for an Inpatient Acute Rehab Consult.  At this time, we will follow greater active participation and tolerance with therapies prior to requesting consult order.  Raechel Ache 02/08/2019, 11:56 AM  I can be reached at [phone].

## 2019-02-08 NOTE — Progress Notes (Signed)
Initial Nutrition Assessment  RD working remotely.  DOCUMENTATION CODES:   Not applicable  INTERVENTION:  Increase to Ensure Enlive po TID, each supplement provides 350 kcal and 20 grams of protein.  Provide daily MVI.  NUTRITION DIAGNOSIS:   Inadequate oral intake related to decreased appetite as evidenced by meal completion < 50%.  GOAL:   Patient will meet greater than or equal to 90% of their needs  MONITOR:   PO intake, Supplement acceptance, Labs, Weight trends, I & O's  REASON FOR ASSESSMENT:   Consult Assessment of nutrition requirement/status  ASSESSMENT:   23 year old female with PMHx of GERD, migraines, anxiety, depression admitted with suspected drug overdose, acute MCA infarct.   Attempted to call patient over the phone for nutrition/weight history but she was unable to answer. According to provider notes from today patient has been resting in bed with her eyes closed most of the day and did not verbalize any words during PT session today. She was placed on a dysphagia 3 diet with thin liquids yesterday following SLP evaluation. No meal completion data has been documented at this time. Per chaplain note patient has not had much to eat today.  Unsure if current weight is a true measured weight or just reported as it is the same exact weight that was documented on last encounter 09/23/2017.  Medications reviewed and include: Novolog 0-15 units Q4hrs, Zosyn, vancomycin, NS at 100 mL/hr.  Labs reviewed.  Unable to determine if patient meets criteria for malnutrition at this time.  NUTRITION - FOCUSED PHYSICAL EXAM:  Unable to complete at this time.  Diet Order:   Diet Order            DIET DYS 3 Room service appropriate? Yes with Assist; Fluid consistency: Thin  Diet effective now             EDUCATION NEEDS:   No education needs have been identified at this time  Skin:  Skin Assessment: Reviewed RN Assessment(petechiae)  Last BM:   Unknown  Height:   Ht Readings from Last 1 Encounters:  02/08/19 5\' 5"  (1.651 m)   Weight:   Wt Readings from Last 1 Encounters:  02/07/19 59 kg   Ideal Body Weight:  56.8 kg  BMI:  Body mass index is 21.64 kg/m.  Estimated Nutritional Needs:   Kcal:  6387-5643  Protein:  70-80 grams  Fluid:  1.6-1.8 L/day  Jacklynn Barnacle, MS, RD, LDN Office: [phone] Pager: [phone] After Hours/Weekend Pager: [phone]

## 2019-02-08 NOTE — Progress Notes (Signed)
Inpatient Diabetes Program Recommendations  AACE/ADA: New Consensus Statement on Inpatient Glycemic Control (2015)  Target Ranges:  Prepandial:   less than 140 mg/dL      Peak postprandial:   less than 180 mg/dL (1-2 hours)      Critically ill patients:  140 - 180 mg/dL    Results for Jane Watkins, Jane Watkins (MRN [id number]) as of 02/08/2019 09:32  Ref. Range 02/07/2019 16:47 02/07/2019 23:56 02/08/2019 00:27 02/08/2019 04:06 02/08/2019 07:30  Glucose-Capillary Latest Ref Range: 70 - 99 mg/dL 72  Pt received 2 units NOVOLOG at 9:23pm 69 (L) 78 95 85   Results for Jane Watkins, Jane Watkins (MRN [id number]) as of 02/08/2019 09:32  Ref. Range 02/07/2019 14:21  Hemoglobin A1C Latest Ref Range: 4.8 - 5.6 % 5.0     Admit with: Found down/unresponsive/ Urine drug screen positive for cocaine, benzodiazepines, marijuana/ Acute MCA Infarct  History: IVDU, polysubstance abuse   Current Orders: Novolog Moderate Correction Scale/ SSI (0-15 units) Q4 hours     Pt received 2 units Novolog at 9:23pm.  Hypoglycemic at Midnight (CBG 69).  Current A1c= 5%.    MD- Please consider stopping Novolog SSI for now.  Would continue CBG checks.     --Will follow patient during hospitalization--  Wyn Quaker RN, MSN, CDE Diabetes Coordinator Inpatient Glycemic Control Team Team Pager: [phone] (8a-5p)

## 2019-02-08 NOTE — Progress Notes (Addendum)
Progress Note    Jane Watkins  XLK:[id number] DOB: 1995/12/08  DOA: 02/07/2019 PCP: Patient, No Pcp Per      Brief Narrative:    Medical records reviewed and are as summarized below:   Jane Watkins is a 23 y.o. female with medical history significant of IVDU, polysubstance abuse, asthma, anxiety, GERD, migraines presents to the emergency department after being found down/unresponsive at home.  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      Assessment/Plan:   Principal Problem:   CVA (cerebral vascular accident) ([place])   Body mass index is 21.64 kg/m.   Acute right MCA stroke with left-sided hemiparesis/unresponsiveness: Continue rectal aspirin and supportive care.  Start Plavix and statins once patient is awake enough to swallow.  Patient has been evaluated by the neurologist.  Consulted palliative care team.  2D echo showed EF estimated at 50 to 55% and there was no evidence of endocarditis.  I met with her boyfriend at the bedside.  He requested test for STI and hepatitis C.  I informed him that I would have to speak to the patient's father regarding plan of care.  Case was discussed with Mr. Dario Ave, her father.  Follow-up with neurologist for further recommendations.  Leukocytosis/bandemia/fever: Leukocytosis probably reactive.  She developed a fever later this afternoon. It is not clear whether fever is due to acute stroke.  No evidence of infection thus far.  Repeat CBC.  Repeat blood cultures.  Consider empiric IV antibiotics if fever persists.  Sinus bradycardia: Monitor on telemetry  Hyperglycemia: Improved.  Hemoglobin A1c 5.0.  History of asthma, GERD and migraine headaches  Polysubstance abuse   Family Communication/Anticipated D/C date and plan/Code Status   DVT prophylaxis: Lovenox Code Status: DNR Family Communication: Plan discussed with patient's father over the phone disposition Plan: To be  determined      Subjective:   Patient is unresponsive and unable to provide any history  Objective:    Vitals:   02/08/19 0915 02/08/19 1523 02/08/19 1544 02/08/19 1547  BP: 116/69 97/66    Pulse:  (!) 55 60   Resp:  16    Temp:    (!) 101.4 F (38.6 C)  TempSrc:    Axillary  SpO2:  100%    Weight:      Height:        Intake/Output Summary (Last 24 hours) at 02/08/2019 1619 Last data filed at 02/08/2019 1528 Gross per 24 hour  Intake 986.68 ml  Output 1200 ml  Net -213.32 ml   Filed Weights   02/07/19 0945  Weight: 59 kg    Exam:  GEN: NAD SKIN: No rash EYES: PERRLA ENT: MMM CV: RRR PULM: CTA B ABD: soft, ND, +BS CNS: Unresponsive, she moves right upper and lower extremity slightly in response to pain but she does not move her left upper and lower extremities. EXT: No edema   Data Reviewed:   I have personally reviewed following labs and imaging studies:  Labs: Labs show the following:   Basic Metabolic Panel: Recent Labs  Lab 02/07/19 0938 02/07/19 1421 02/08/19 0705  NA 140 140  --   K 5.2* 4.9  --   CL 110 111  --   CO2 20* 22  --   GLUCOSE 199* 100*  --   BUN 25* 22*  --   CREATININE 0.67 0.66 0.48  CALCIUM 8.6* 8.0*  --    GFR Estimated Creatinine  Clearance: 98.4 mL/min (by C-G formula based on SCr of 0.48 mg/dL). Liver Function Tests: Recent Labs  Lab 02/07/19 0938  AST 31  ALT 58*  ALKPHOS 83  BILITOT 0.9  PROT 7.9  ALBUMIN 4.6   No results for input(s): LIPASE, AMYLASE in the last 168 hours. No results for input(s): AMMONIA in the last 168 hours. Coagulation profile No results for input(s): INR, PROTIME in the last 168 hours.  CBC: Recent Labs  Lab 02/07/19 0938  WBC 20.7*  NEUTROABS 18.9*  HGB 13.8  HCT 42.3  MCV 86.7  PLT 277   Cardiac Enzymes: Recent Labs  Lab 02/07/19 0938 02/07/19 1421  CKTOTAL 418* 4,477*   BNP (last 3 results) No results for input(s): PROBNP in the last 8760  hours. CBG: Recent Labs  Lab 02/08/19 0027 02/08/19 0406 02/08/19 0730 02/08/19 1203 02/08/19 1545  GLUCAP 78 95 85 98 75   D-Dimer: No results for input(s): DDIMER in the last 72 hours. Hgb A1c: Recent Labs    02/07/19 1421  HGBA1C 5.0   Lipid Profile: Recent Labs    02/08/19 0705  CHOL 87  HDL 35*  LDLCALC 35  TRIG 87  CHOLHDL 2.5   Thyroid function studies: No results for input(s): TSH, T4TOTAL, T3FREE, THYROIDAB in the last 72 hours.  Invalid input(s): FREET3 Anemia work up: No results for input(s): VITAMINB12, FOLATE, FERRITIN, TIBC, IRON, RETICCTPCT in the last 72 hours. Sepsis Labs: Recent Labs  Lab 02/07/19 0938 02/07/19 1134  WBC 20.7*  --   LATICACIDVEN  --  1.3    Microbiology Recent Results (from the past 240 hour(s))  SARS CORONAVIRUS 2 (TAT 6-24 HRS) Nasopharyngeal Nasopharyngeal Swab     Status: None   Collection Time: 02/07/19 10:32 AM   Specimen: Nasopharyngeal Swab  Result Value Ref Range Status   SARS Coronavirus 2 NEGATIVE NEGATIVE Final    Comment: (NOTE) SARS-CoV-2 target nucleic acids are NOT DETECTED. The SARS-CoV-2 RNA is generally detectable in upper and lower respiratory specimens during the acute phase of infection. Negative results do not preclude SARS-CoV-2 infection, do not rule out co-infections with other pathogens, and should not be used as the sole basis for treatment or other patient management decisions. Negative results must be combined with clinical observations, patient history, and epidemiological information. The expected result is Negative. Fact Sheet for Patients: SugarRoll.be Fact Sheet for Healthcare Providers: https://www.woods-mathews.com/ This test is not yet approved or cleared by the [place] FDA and  has been authorized for detection and/or diagnosis of SARS-CoV-2 by FDA under an Emergency Use Authorization (EUA). This EUA will remain  in effect (meaning  this test can be used) for the duration of the COVID-19 declaration under Section 56 4(b)(1) of the Act, 21 U.S.C. section 360bbb-3(b)(1), unless the authorization is terminated or revoked sooner. Performed at [facility], [place] [address], [place], [place] [address]   Blood culture (routine x 2)     Status: None (Preliminary result)   Collection Time: 02/07/19 11:34 AM   Specimen: BLOOD  Result Value Ref Range Status   Specimen Description BLOOD BLOOD RIGHT ARM  Final   Special Requests   Final    BOTTLES DRAWN AEROBIC AND ANAEROBIC Blood Culture results may not be optimal due to an excessive volume of blood received in culture bottles   Culture   Final    NO GROWTH < 24 HOURS Performed at [facility], [address] [place], [place] [address]  Report Status PENDING  Incomplete  Blood culture (routine x 2)     Status: None (Preliminary result)   Collection Time: 02/07/19 11:34 AM   Specimen: BLOOD  Result Value Ref Range Status   Specimen Description BLOOD BLOOD LEFT FOREARM  Final   Special Requests   Final    BOTTLES DRAWN AEROBIC AND ANAEROBIC Blood Culture results may not be optimal due to an excessive volume of blood received in culture bottles   Culture   Final    NO GROWTH < 24 HOURS Performed at [facility], [address], [place], [place] [address]    Report Status PENDING  Incomplete    Procedures and diagnostic studies:  CT ANGIO HEAD W OR WO CONTRAST  Result Date: 02/07/2019 CLINICAL DATA:  Stroke follow-up. Acute right MCA infarct on MRI. EXAM: CT ANGIOGRAPHY HEAD AND NECK TECHNIQUE: Multidetector CT imaging of the head and neck was performed using the standard protocol during bolus administration of intravenous contrast. Multiplanar CT image reconstructions and MIPs were obtained to evaluate the vascular anatomy. Carotid stenosis measurements (when applicable) are obtained utilizing NASCET criteria, using the distal internal carotid  diameter as the denominator. CONTRAST:  46m OMNIPAQUE IOHEXOL 350 MG/ML SOLN COMPARISON:  Head MRI 02/07/2019 FINDINGS: CT HEAD FINDINGS Brain: Hypodensity involving the right basal ganglia as well as cortex and subcortical white matter in the right frontal and parietal lobes corresponds to acute infarcts on today's earlier MRI. No acute intracranial hemorrhage, mass, midline shift, or extra-axial fluid collection is identified. Right basal ganglia edema effaces the frontal horn of the right lateral ventricle. There is no ventricular dilatation. Vascular: No hyperdense vessel. Skull: No fracture or focal osseous lesion. Sinuses: Paranasal sinuses and mastoid air cells are clear. Orbits: Unremarkable. Review of the MIP images confirms the above findings CTA NECK FINDINGS Aortic arch: Standard 3 vessel aortic arch with widely patent arch vessel origins. Right carotid system: Patent and smooth without evidence of stenosis or dissection. Left carotid system: Patent and smooth without evidence of stenosis or dissection. Vertebral arteries: Patent, codominant, and smooth without evidence of stenosis or dissection. Skeleton: No acute osseous abnormality or suspicious osseous lesion. Other neck: No evidence of cervical lymphadenopathy or mass. Upper chest: No apical lung consolidation or mass. Review of the MIP images confirms the above findings CTA HEAD FINDINGS Anterior circulation: The internal carotid arteries are widely patent from skull base to carotid termini. ACAs and MCAs are patent without evidence of proximal branch occlusion or significant proximal stenosis. No aneurysm is identified. Posterior circulation: The intracranial vertebral arteries are widely patent to the basilar. The right PICA and left AICA appear dominant. Patent SCAs are seen bilaterally. The basilar artery is widely patent. There are right larger than left posterior communicating arteries. Both PCAs are patent without evidence of significant  proximal stenosis. No aneurysm is identified. Venous sinuses: Patent. Anatomic variants: None. Review of the MIP images confirms the above findings IMPRESSION: 1. No major arterial occlusion or significant stenosis in the head and neck. 2. Acute right cerebral infarcts as described on MRI. Electronically Signed   By: ALogan BoresM.D.   On: 02/07/2019 16:21   DG Chest 1 View  Result Date: 02/07/2019 CLINICAL DATA:  Altered mental status. Additional history provided: Brought in by ambulance, unresponsive. EXAM: CHEST  1 VIEW COMPARISON:  Chest radiograph 04/30/2016 FINDINGS: A previously demonstrated tracheostomy tube is no longer seen. Heart size within normal limits. There is no airspace consolidation within the lungs.  No evidence of pleural effusion or pneumothorax. No acute bony abnormality is identified. Overlying cardiac monitoring leads. IMPRESSION: No evidence of acute cardiopulmonary abnormality. Electronically Signed   By: Kellie Simmering DO   On: 02/07/2019 09:59   CT Head Wo Contrast  Result Date: 02/07/2019 CLINICAL DATA:  Unresponsive.  Right arm pain. EXAM: CT HEAD WITHOUT CONTRAST TECHNIQUE: Contiguous axial images were obtained from the base of the skull through the vertex without intravenous contrast. COMPARISON:  CT head 04/19/2016. MRI head 04/22/2016. FINDINGS: Brain: Area of ill-defined low-density noted in the right basal ganglia. Some mass effect on the right lateral ventricle. No hemorrhage or hydrocephalus. No midline shift. Vascular: No hyperdense vessel or unexpected calcification. Skull: No acute calvarial abnormality. Sinuses/Orbits: Visualized paranasal sinuses and mastoids clear. Orbital soft tissues unremarkable. Other: None IMPRESSION: Area of indistinct low-density in the right basal ganglia with mass effect on the right lateral ventricle. This could reflect acute infarct, less likely mass lesion although this cannot be excluded. Recommend further evaluation with MRI.  Electronically Signed   By: Rolm Baptise M.D.   On: 02/07/2019 11:01   CT ANGIO NECK W OR WO CONTRAST  Result Date: 02/07/2019 CLINICAL DATA:  Stroke follow-up. Acute right MCA infarct on MRI. EXAM: CT ANGIOGRAPHY HEAD AND NECK TECHNIQUE: Multidetector CT imaging of the head and neck was performed using the standard protocol during bolus administration of intravenous contrast. Multiplanar CT image reconstructions and MIPs were obtained to evaluate the vascular anatomy. Carotid stenosis measurements (when applicable) are obtained utilizing NASCET criteria, using the distal internal carotid diameter as the denominator. CONTRAST:  48m OMNIPAQUE IOHEXOL 350 MG/ML SOLN COMPARISON:  Head MRI 02/07/2019 FINDINGS: CT HEAD FINDINGS Brain: Hypodensity involving the right basal ganglia as well as cortex and subcortical white matter in the right frontal and parietal lobes corresponds to acute infarcts on today's earlier MRI. No acute intracranial hemorrhage, mass, midline shift, or extra-axial fluid collection is identified. Right basal ganglia edema effaces the frontal horn of the right lateral ventricle. There is no ventricular dilatation. Vascular: No hyperdense vessel. Skull: No fracture or focal osseous lesion. Sinuses: Paranasal sinuses and mastoid air cells are clear. Orbits: Unremarkable. Review of the MIP images confirms the above findings CTA NECK FINDINGS Aortic arch: Standard 3 vessel aortic arch with widely patent arch vessel origins. Right carotid system: Patent and smooth without evidence of stenosis or dissection. Left carotid system: Patent and smooth without evidence of stenosis or dissection. Vertebral arteries: Patent, codominant, and smooth without evidence of stenosis or dissection. Skeleton: No acute osseous abnormality or suspicious osseous lesion. Other neck: No evidence of cervical lymphadenopathy or mass. Upper chest: No apical lung consolidation or mass. Review of the MIP images confirms the  above findings CTA HEAD FINDINGS Anterior circulation: The internal carotid arteries are widely patent from skull base to carotid termini. ACAs and MCAs are patent without evidence of proximal branch occlusion or significant proximal stenosis. No aneurysm is identified. Posterior circulation: The intracranial vertebral arteries are widely patent to the basilar. The right PICA and left AICA appear dominant. Patent SCAs are seen bilaterally. The basilar artery is widely patent. There are right larger than left posterior communicating arteries. Both PCAs are patent without evidence of significant proximal stenosis. No aneurysm is identified. Venous sinuses: Patent. Anatomic variants: None. Review of the MIP images confirms the above findings IMPRESSION: 1. No major arterial occlusion or significant stenosis in the head and neck. 2. Acute right cerebral infarcts as described on MRI.  Electronically Signed   By: Logan Bores M.D.   On: 02/07/2019 16:21   MR Brain W and Wo Contrast  Result Date: 02/07/2019 CLINICAL DATA:  Altered mental status.  Left-sided weakness. EXAM: MRI HEAD WITHOUT AND WITH CONTRAST TECHNIQUE: Multiplanar, multiecho pulse sequences of the brain and surrounding structures were obtained without and with intravenous contrast. CONTRAST:  44m GADAVIST GADOBUTROL 1 MMOL/ML IV SOLN COMPARISON:  Head CT same day FINDINGS: Brain: The brainstem and cerebellum are normal. Left cerebral hemispheres normal. There is acute infarction throughout the right basal ganglia. There is acute infarction of the right cortical and subcortical brain in a watershed distribution. Separate small focus of acute infarction at the right parietal cortical brain. Areas of infarction show mild swelling with petechial blood products but no frank hematoma. Mild swelling but no midline shift. No extra-axial collection. After contrast administration, there is no abnormal enhancement to suggest cerebritis. Vascular: Major vessels at  the base of the brain show flow. Skull and upper cervical spine: Negative Sinuses/Orbits: Some mucosal disease of the frontal and ethmoid sinuses. Orbits negative. Other: None IMPRESSION: Acute infarction in the right MCA territory affecting the basal ganglia and affecting the cortical and subcortical brain in a largely watershed distribution. 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 Mild swelling of the affected areas with petechial blood products but no frank hematoma. No abnormal enhancement to suggest septic emboli. Electronically Signed   By: MNelson ChimesM.D.   On: 02/07/2019 12:38   ECHOCARDIOGRAM COMPLETE BUBBLE STUDY  Result Date: 02/08/2019   ECHOCARDIOGRAM REPORT   Patient Name:   LCITLALLY CAPTAINDate of Exam: 02/07/2019 Medical Rec #:  [id number] Height:       62.0 in Accession #:    [id number]:       130.1 lb Date of Birth:  [birth year]  BSA:          1.59 m Patient Age:    23 years   BP:           113/76 mmHg Patient Gender: F          HR:           85 bpm. Exam Location:  [facility] Procedure: 2D Echo, Cardiac Doppler and Color Doppler Indications:     stroke 434.91  History:         Patient has prior history of Echocardiogram examinations. Drug                  overdose                  Anxiety.  Sonographer:     CAlyse LowRoar Referring Phys:  [id number] Phys: CNelva BushMD IMPRESSIONS  1. Left ventricular ejection fraction, by visual estimation, is 50 to 55%. The left ventricle has low normal function. There is no left ventricular hypertrophy.  2. The left ventricle has no regional wall motion abnormalities.  3. Global right ventricle has low normal systolic function.The right ventricular size is normal. No increase in right ventricular wall thickness.  4. Left atrial size was normal.  5. Right atrial size was normal.  6. The mitral valve is normal in structure. No evidence of mitral valve regurgitation. No evidence of mitral stenosis.  7.  The tricuspid valve is normal in structure.  8. The aortic valve is normal in structure. Aortic valve regurgitation is not visualized. No evidence of aortic valve sclerosis or  stenosis.  9. The pulmonic valve was not well visualized. Pulmonic valve regurgitation is not visualized. 10. Normal pulmonary artery systolic pressure. 11. The inferior vena cava is dilated in size with <50% respiratory variability, suggesting right atrial pressure of 15 mmHg. 12. The interatrial septum was not well visualized. FINDINGS  Left Ventricle: Left ventricular ejection fraction, by visual estimation, is 50 to 55%. The left ventricle has low normal function. The left ventricle has no regional wall motion abnormalities. The left ventricular internal cavity size was the left ventricle is normal in size. There is no left ventricular hypertrophy. Left ventricular diastolic parameters were normal. Right Ventricle: The right ventricular size is normal. No increase in right ventricular wall thickness. Global RV systolic function is has low normal systolic function. The tricuspid regurgitant velocity is 1.99 m/s, and with an assumed right atrial pressure of 15 mmHg, the estimated right ventricular systolic pressure is normal at 30.8 mmHg. Left Atrium: Left atrial size was normal in size. Right Atrium: Right atrial size was normal in size Pericardium: There is no evidence of pericardial effusion. Mitral Valve: The mitral valve is normal in structure. No evidence of mitral valve regurgitation. No evidence of mitral valve stenosis by observation. Tricuspid Valve: The tricuspid valve is normal in structure. Tricuspid valve regurgitation is mild. Aortic Valve: The aortic valve is normal in structure. Aortic valve regurgitation is not visualized. The aortic valve is structurally normal, with no evidence of sclerosis or stenosis. Aortic valve mean gradient measures 4.0 mmHg. Aortic valve peak gradient measures 6.8 mmHg. Aortic valve area, by VTI  measures 1.83 cm. Pulmonic Valve: The pulmonic valve was not well visualized. Pulmonic valve regurgitation is not visualized. Pulmonic regurgitation is not visualized. No evidence of pulmonic stenosis. Aorta: The aortic root is normal in size and structure. Pulmonary Artery: The pulmonary artery is not well seen. Venous: The inferior vena cava is dilated in size with less than 50% respiratory variability, suggesting right atrial pressure of 15 mmHg. IAS/Shunts: The interatrial septum was not well visualized. Agitated saline contrast was given intravenously to evaluate for intracardiac shunting. Saline contrast bubble study was negative, with no evidence of any interatrial shunt.  LEFT VENTRICLE PLAX 2D LVIDd:         4.45 cm  Diastology LVIDs:         3.27 cm  LV e' lateral:   19.30 cm/s LV PW:         0.87 cm  LV E/e' lateral: 3.9 LV IVS:        0.80 cm  LV e' medial:    14.40 cm/s LVOT diam:     1.90 cm  LV E/e' medial:  5.2 LV SV:         47 ml LV SV Index:   29.02 LVOT Area:     2.84 cm  RIGHT VENTRICLE RV S prime:     11.10 cm/s TAPSE (M-mode): 1.5 cm LEFT ATRIUM             Index       RIGHT ATRIUM           Index LA diam:        3.20 cm 2.01 cm/m  RA Area:     10.10 cm LA Vol (A2C):   44.7 ml 28.07 ml/m RA Volume:   22.90 ml  14.38 ml/m LA Vol (A4C):   27.8 ml 17.46 ml/m LA Biplane Vol: 35.3 ml 22.17 ml/m  AORTIC VALVE  PULMONIC VALVE AV Area (Vmax):    2.02 cm    PV Vmax:       0.81 m/s AV Area (Vmean):   1.71 cm    PV Peak grad:  2.7 mmHg AV Area (VTI):     1.83 cm AV Vmax:           130.00 cm/s AV Vmean:          93.100 cm/s AV VTI:            0.238 m AV Peak Grad:      6.8 mmHg AV Mean Grad:      4.0 mmHg LVOT Vmax:         92.40 cm/s LVOT Vmean:        56.000 cm/s LVOT VTI:          0.154 m LVOT/AV VTI ratio: 0.65  AORTA Ao Root diam: 2.30 cm MITRAL VALVE                        TRICUSPID VALVE MV Area (PHT): 4.36 cm             TR Peak grad:   15.8 mmHg MV PHT:        50.46  msec           TR Vmax:        199.00 cm/s MV Decel Time: 174 msec MV E velocity: 75.20 cm/s 103 cm/s  SHUNTS MV A velocity: 44.10 cm/s 70.3 cm/s Systemic VTI:  0.15 m MV E/A ratio:  1.71       1.5       Systemic Diam: 1.90 cm  Nelva Bush MD Electronically signed by Nelva Bush MD Signature Date/Time: 02/08/2019/7:35:17 AM    Final (Updated)    CT Maxillofacial Wo Contrast  Result Date: 02/07/2019 CLINICAL DATA:  Found unresponsive. EXAM: CT MAXILLOFACIAL WITHOUT CONTRAST TECHNIQUE: Multidetector CT imaging of the maxillofacial structures was performed. Multiplanar CT image reconstructions were also generated. COMPARISON:  None. FINDINGS: Osseous: No fracture or mandibular dislocation. No destructive process. Orbits: Negative. No traumatic or inflammatory finding. Sinuses: Mucosal thickening in the left frontal sinus and anterior left ethmoid air cells. No air-fluid levels. Mastoids clear. Soft tissues: Negative Limited intracranial: See head CT report. IMPRESSION: No acute bony abnormality in the face/orbits. Minimal chronic sinusitis changes. Electronically Signed   By: Rolm Baptise M.D.   On: 02/07/2019 11:02    Medications:   .  stroke: mapping our early stages of recovery book   Does not apply Once  . aspirin  300 mg Rectal Daily  . enoxaparin (LOVENOX) injection  40 mg Subcutaneous Q24H  . feeding supplement (ENSURE ENLIVE)  237 mL Oral TID BM  . insulin aspart  0-15 Units Subcutaneous Q4H  . [START ON 02/09/2019] multivitamin with minerals  1 tablet Oral Daily  . QUEtiapine  100 mg Oral QHS   Continuous Infusions: . sodium chloride 100 mL/hr at 02/08/19 1424  . piperacillin-tazobactam (ZOSYN)  IV 3.375 g (02/08/19 1016)  . vancomycin 1,000 mg (02/08/19 1014)     LOS: 1 day   Elie Gragert  [facility]   *Please refer to [email or web address], password [email or web address] to get updated schedule on who will round on this patient, as hospitalists switch teams weekly. If 7PM-7AM, please  contact night-coverage at [email or web address], password [email or web address] for any overnight needs.  02/08/2019, 4:19 PM

## 2019-02-08 NOTE — Progress Notes (Addendum)
Pt on cont moniter for hr and b/p/ and sats. Pt arouses and hr will increase. When pt is  Sleeping hrs  Goes to high 50s to mid 60s. No resp distress resp 19.sats at 100 on 2 l [place] 02.
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
## 2019-02-08 NOTE — Progress Notes (Signed)
   02/08/19 1300  Clinical Encounter Type  Visited With Patient;Family;Health care provider  Visit Type Initial  Referral From Physician  Ch received an OR for emotional support. Pt was resting in bed with eye closed and boyfriend and sitter were at bedside. Pt kept her eyes closed and did not respond to ch. Ch noticed a doggy plushie and asked the boyfriend about it. He said that the pt had to put down her dog two weeks ago. They reported that pt had not been eating much all day and was just drinking. Ch will follow up at a later time when the pt is awake.

## 2019-02-08 NOTE — Progress Notes (Signed)
Subjective: Patient unchanged.  No new neurological complaints.    Objective: Current vital signs: BP 116/69   Pulse (!) 56   Temp 98.5 F (36.9 C) (Axillary)   Resp 16   Ht 5\' 5"  (1.651 m)   Wt 59 kg   LMP 01/31/2019 Comment: neg preg test today  SpO2 100%   BMI 21.64 kg/m  Vital signs in last 24 hours: Temp:  [97.7 F (36.5 C)-98.5 F (36.9 C)] 98.5 F (36.9 C) (12/29 0716) Pulse Rate:  [53-88] 56 (12/29 0716) Resp:  [11-16] 16 (12/29 0716) BP: (93-117)/(58-80) 116/69 (12/29 0915) SpO2:  [95 %-100 %] 100 % (12/29 0716)  Intake/Output from previous day: 12/28 0701 - 12/29 0700 In: 3218.2 [I.V.:2669; IV Piggyback:549.1] Out: 500 [Urine:500] Intake/Output this shift: No intake/output data recorded. Nutritional status:  Diet Order            DIET DYS 3 Room service appropriate? Yes with Assist; Fluid consistency: Thin  Diet effective now              Neurologic Exam: Mental Status: Lethargic.  Speech unintelligible.  Follows simple commands.   Cranial Nerves: II: Discs flat bilaterally; Blinks to bilateral confrontation.  Pupils equal, round, reactive to light and accommodation III,IV, VI: Oculocephalic maneuver intact bilaterally V,VII: left facial droop VIII: hearing normal bilaterally IX,X: gag reflex reduced XI: unable to test XII: midline tongue extension Motor: Able to move the right upper and right lower extremities well against gravity.  Minimal movement noted on the left Sensory: Responds to noxious stimuli throughout  Lab Results: Basic Metabolic Panel: Recent Labs  Lab 02/07/19 0938 02/07/19 1421 02/08/19 0705  NA 140 140  --   K 5.2* 4.9  --   CL 110 111  --   CO2 20* 22  --   GLUCOSE 199* 100*  --   BUN 25* 22*  --   CREATININE 0.67 0.66 0.48  CALCIUM 8.6* 8.0*  --     Liver Function Tests: Recent Labs  Lab 02/07/19 0938  AST 31  ALT 58*  ALKPHOS 83  BILITOT 0.9  PROT 7.9  ALBUMIN 4.6   No results for input(s): LIPASE,  AMYLASE in the last 168 hours. No results for input(s): AMMONIA in the last 168 hours.  CBC: Recent Labs  Lab 02/07/19 0938  WBC 20.7*  NEUTROABS 18.9*  HGB 13.8  HCT 42.3  MCV 86.7  PLT 277    Cardiac Enzymes: Recent Labs  Lab 02/07/19 0938 02/07/19 1421  CKTOTAL 418* 4,477*    Lipid Panel: Recent Labs  Lab 02/08/19 0705  CHOL 87  TRIG 87  HDL 35*  CHOLHDL 2.5  VLDL 17  LDLCALC 35    CBG: Recent Labs  Lab 02/07/19 1647 02/07/19 2356 02/08/19 0027 02/08/19 0406 02/08/19 0730  GLUCAP 72 69* 78 95 85    Microbiology: Results for orders placed or performed during the hospital encounter of 02/07/19  SARS CORONAVIRUS 2 (TAT 6-24 HRS) Nasopharyngeal Nasopharyngeal Swab     Status: None   Collection Time: 02/07/19 10:32 AM   Specimen: Nasopharyngeal Swab  Result Value Ref Range Status   SARS Coronavirus 2 NEGATIVE NEGATIVE Final    Comment: (NOTE) SARS-CoV-2 target nucleic acids are NOT DETECTED. The SARS-CoV-2 RNA is generally detectable in upper and lower respiratory specimens during the acute phase of infection. Negative results do not preclude SARS-CoV-2 infection, do not rule out co-infections with other pathogens, and should not be used as the sole  basis for treatment or other patient management decisions. Negative results must be combined with clinical observations, patient history, and epidemiological information. The expected result is Negative. Fact Sheet for Patients: SugarRoll.be Fact Sheet for Healthcare Providers: https://www.woods-mathews.com/ This test is not yet approved or cleared by the [place] FDA and  has been authorized for detection and/or diagnosis of SARS-CoV-2 by FDA under an Emergency Use Authorization (EUA). This EUA will remain  in effect (meaning this test can be used) for the duration of the COVID-19 declaration under Section 56 4(b)(1) of the Act, 21 U.S.C. section  360bbb-3(b)(1), unless the authorization is terminated or revoked sooner. Performed at [facility], [place] [address] [place], [place] [address]   Blood culture (routine x 2)     Status: None (Preliminary result)   Collection Time: 02/07/19 11:34 AM   Specimen: BLOOD  Result Value Ref Range Status   Specimen Description BLOOD BLOOD RIGHT ARM  Final   Special Requests   Final    BOTTLES DRAWN AEROBIC AND ANAEROBIC Blood Culture results may not be optimal due to an excessive volume of blood received in culture bottles   Culture   Final    NO GROWTH < 24 HOURS Performed at [facility], [address] [place], [place] [address]    Report Status PENDING  Incomplete  Blood culture (routine x 2)     Status: None (Preliminary result)   Collection Time: 02/07/19 11:34 AM   Specimen: BLOOD  Result Value Ref Range Status   Specimen Description BLOOD BLOOD LEFT FOREARM  Final   Special Requests   Final    BOTTLES DRAWN AEROBIC AND ANAEROBIC Blood Culture results may not be optimal due to an excessive volume of blood received in culture bottles   Culture   Final    NO GROWTH < 24 HOURS Performed at [facility], [address] [place], [place] [address]    Report Status PENDING  Incomplete    Coagulation Studies: No results for input(s): LABPROT, INR in the last 72 hours.  Imaging: CT ANGIO HEAD W OR WO CONTRAST  Result Date: 02/07/2019 CLINICAL DATA:  Stroke follow-up. Acute right MCA infarct on MRI. EXAM: CT ANGIOGRAPHY HEAD AND NECK TECHNIQUE: Multidetector CT imaging of the head and neck was performed using the standard protocol during bolus administration of intravenous contrast. Multiplanar CT image reconstructions and MIPs were obtained to evaluate the vascular anatomy. Carotid stenosis measurements (when applicable) are obtained utilizing NASCET criteria, using the distal internal carotid diameter as the denominator. CONTRAST:  10mL OMNIPAQUE IOHEXOL 350  MG/ML SOLN COMPARISON:  Head MRI 02/07/2019 FINDINGS: CT HEAD FINDINGS Brain: Hypodensity involving the right basal ganglia as well as cortex and subcortical white matter in the right frontal and parietal lobes corresponds to acute infarcts on today's earlier MRI. No acute intracranial hemorrhage, mass, midline shift, or extra-axial fluid collection is identified. Right basal ganglia edema effaces the frontal horn of the right lateral ventricle. There is no ventricular dilatation. Vascular: No hyperdense vessel. Skull: No fracture or focal osseous lesion. Sinuses: Paranasal sinuses and mastoid air cells are clear. Orbits: Unremarkable. Review of the MIP images confirms the above findings CTA NECK FINDINGS Aortic arch: Standard 3 vessel aortic arch with widely patent arch vessel origins. Right carotid system: Patent and smooth without evidence of stenosis or dissection. Left carotid system: Patent and smooth without evidence of stenosis or dissection. Vertebral arteries: Patent, codominant, and smooth without evidence of stenosis or dissection.  Skeleton: No acute osseous abnormality or suspicious osseous lesion. Other neck: No evidence of cervical lymphadenopathy or mass. Upper chest: No apical lung consolidation or mass. Review of the MIP images confirms the above findings CTA HEAD FINDINGS Anterior circulation: The internal carotid arteries are widely patent from skull base to carotid termini. ACAs and MCAs are patent without evidence of proximal branch occlusion or significant proximal stenosis. No aneurysm is identified. Posterior circulation: The intracranial vertebral arteries are widely patent to the basilar. The right PICA and left AICA appear dominant. Patent SCAs are seen bilaterally. The basilar artery is widely patent. There are right larger than left posterior communicating arteries. Both PCAs are patent without evidence of significant proximal stenosis. No aneurysm is identified. Venous sinuses: Patent.  Anatomic variants: None. Review of the MIP images confirms the above findings IMPRESSION: 1. No major arterial occlusion or significant stenosis in the head and neck. 2. Acute right cerebral infarcts as described on MRI. Electronically Signed   By: Sebastian Ache M.D.   On: 02/07/2019 16:21   DG Chest 1 View  Result Date: 02/07/2019 CLINICAL DATA:  Altered mental status. Additional history provided: Brought in by ambulance, unresponsive. EXAM: CHEST  1 VIEW COMPARISON:  Chest radiograph 04/30/2016 FINDINGS: A previously demonstrated tracheostomy tube is no longer seen. Heart size within normal limits. There is no airspace consolidation within the lungs. No evidence of pleural effusion or pneumothorax. No acute bony abnormality is identified. Overlying cardiac monitoring leads. IMPRESSION: No evidence of acute cardiopulmonary abnormality. Electronically Signed   By: Jackey Loge DO   On: 02/07/2019 09:59   CT Head Wo Contrast  Result Date: 02/07/2019 CLINICAL DATA:  Unresponsive.  Right arm pain. EXAM: CT HEAD WITHOUT CONTRAST TECHNIQUE: Contiguous axial images were obtained from the base of the skull through the vertex without intravenous contrast. COMPARISON:  CT head 04/19/2016. MRI head 04/22/2016. FINDINGS: Brain: Area of ill-defined low-density noted in the right basal ganglia. Some mass effect on the right lateral ventricle. No hemorrhage or hydrocephalus. No midline shift. Vascular: No hyperdense vessel or unexpected calcification. Skull: No acute calvarial abnormality. Sinuses/Orbits: Visualized paranasal sinuses and mastoids clear. Orbital soft tissues unremarkable. Other: None IMPRESSION: Area of indistinct low-density in the right basal ganglia with mass effect on the right lateral ventricle. This could reflect acute infarct, less likely mass lesion although this cannot be excluded. Recommend further evaluation with MRI. Electronically Signed   By: Charlett Nose M.D.   On: 02/07/2019 11:01   CT  ANGIO NECK W OR WO CONTRAST  Result Date: 02/07/2019 CLINICAL DATA:  Stroke follow-up. Acute right MCA infarct on MRI. EXAM: CT ANGIOGRAPHY HEAD AND NECK TECHNIQUE: Multidetector CT imaging of the head and neck was performed using the standard protocol during bolus administration of intravenous contrast. Multiplanar CT image reconstructions and MIPs were obtained to evaluate the vascular anatomy. Carotid stenosis measurements (when applicable) are obtained utilizing NASCET criteria, using the distal internal carotid diameter as the denominator. CONTRAST:  75mL OMNIPAQUE IOHEXOL 350 MG/ML SOLN COMPARISON:  Head MRI 02/07/2019 FINDINGS: CT HEAD FINDINGS Brain: Hypodensity involving the right basal ganglia as well as cortex and subcortical white matter in the right frontal and parietal lobes corresponds to acute infarcts on today's earlier MRI. No acute intracranial hemorrhage, mass, midline shift, or extra-axial fluid collection is identified. Right basal ganglia edema effaces the frontal horn of the right lateral ventricle. There is no ventricular dilatation. Vascular: No hyperdense vessel. Skull: No fracture or focal osseous lesion. Sinuses:  Paranasal sinuses and mastoid air cells are clear. Orbits: Unremarkable. Review of the MIP images confirms the above findings CTA NECK FINDINGS Aortic arch: Standard 3 vessel aortic arch with widely patent arch vessel origins. Right carotid system: Patent and smooth without evidence of stenosis or dissection. Left carotid system: Patent and smooth without evidence of stenosis or dissection. Vertebral arteries: Patent, codominant, and smooth without evidence of stenosis or dissection. Skeleton: No acute osseous abnormality or suspicious osseous lesion. Other neck: No evidence of cervical lymphadenopathy or mass. Upper chest: No apical lung consolidation or mass. Review of the MIP images confirms the above findings CTA HEAD FINDINGS Anterior circulation: The internal carotid  arteries are widely patent from skull base to carotid termini. ACAs and MCAs are patent without evidence of proximal branch occlusion or significant proximal stenosis. No aneurysm is identified. Posterior circulation: The intracranial vertebral arteries are widely patent to the basilar. The right PICA and left AICA appear dominant. Patent SCAs are seen bilaterally. The basilar artery is widely patent. There are right larger than left posterior communicating arteries. Both PCAs are patent without evidence of significant proximal stenosis. No aneurysm is identified. Venous sinuses: Patent. Anatomic variants: None. Review of the MIP images confirms the above findings IMPRESSION: 1. No major arterial occlusion or significant stenosis in the head and neck. 2. Acute right cerebral infarcts as described on MRI. Electronically Signed   By: Sebastian Ache M.D.   On: 02/07/2019 16:21   MR Brain W and Wo Contrast  Result Date: 02/07/2019 CLINICAL DATA:  Altered mental status.  Left-sided weakness. EXAM: MRI HEAD WITHOUT AND WITH CONTRAST TECHNIQUE: Multiplanar, multiecho pulse sequences of the brain and surrounding structures were obtained without and with intravenous contrast. CONTRAST:  5mL GADAVIST GADOBUTROL 1 MMOL/ML IV SOLN COMPARISON:  Head CT same day FINDINGS: Brain: The brainstem and cerebellum are normal. Left cerebral hemispheres normal. There is acute infarction throughout the right basal ganglia. There is acute infarction of the right cortical and subcortical brain in a watershed distribution. Separate small focus of acute infarction at the right parietal cortical brain. Areas of infarction show mild swelling with petechial blood products but no frank hematoma. Mild swelling but no midline shift. No extra-axial collection. After contrast administration, there is no abnormal enhancement to suggest cerebritis. Vascular: Major vessels at the base of the brain show flow. Skull and upper cervical spine: Negative  Sinuses/Orbits: Some mucosal disease of the frontal and ethmoid sinuses. Orbits negative. Other: None IMPRESSION: Acute infarction in the right MCA territory affecting the basal ganglia and affecting the cortical and subcortical brain in a largely watershed distribution. 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 Mild swelling of the affected areas with petechial blood products but no frank hematoma. No abnormal enhancement to suggest septic emboli. Electronically Signed   By: Paulina Fusi M.D.   On: 02/07/2019 12:38   ECHOCARDIOGRAM COMPLETE BUBBLE STUDY  Result Date: 02/08/2019   ECHOCARDIOGRAM REPORT   Patient Name:   Jane Watkins Date of Exam: 02/07/2019 Medical Rec #:  [id number]  Height:       62.0 in Accession #:    [id number] Weight:       130.1 lb Date of Birth:  February 21, 1995   BSA:          1.59 m Patient Age:    23 years   BP:           113/76 mmHg Patient Gender: F  HR:           85 bpm. Exam Location:  [facility] Procedure: 2D Echo, Cardiac Doppler and Color Doppler Indications:     stroke 434.91  History:         Patient has prior history of Echocardiogram examinations. Drug                  overdose                  Anxiety.  Sonographer:     Neysa Bonito Roar Referring Phys:  [id number] Diagnosing Phys: Yvonne Kendall MD IMPRESSIONS  1. Left ventricular ejection fraction, by visual estimation, is 50 to 55%. The left ventricle has low normal function. There is no left ventricular hypertrophy.  2. The left ventricle has no regional wall motion abnormalities.  3. Global right ventricle has low normal systolic function.The right ventricular size is normal. No increase in right ventricular wall thickness.  4. Left atrial size was normal.  5. Right atrial size was normal.  6. The mitral valve is normal in structure. No evidence of mitral valve regurgitation. No evidence of mitral stenosis.  7. The tricuspid valve is normal in structure.  8. The aortic valve is normal  in structure. Aortic valve regurgitation is not visualized. No evidence of aortic valve sclerosis or stenosis.  9. The pulmonic valve was not well visualized. Pulmonic valve regurgitation is not visualized. 10. Normal pulmonary artery systolic pressure. 11. The inferior vena cava is dilated in size with <50% respiratory variability, suggesting right atrial pressure of 15 mmHg. 12. The interatrial septum was not well visualized. FINDINGS  Left Ventricle: Left ventricular ejection fraction, by visual estimation, is 50 to 55%. The left ventricle has low normal function. The left ventricle has no regional wall motion abnormalities. The left ventricular internal cavity size was the left ventricle is normal in size. There is no left ventricular hypertrophy. Left ventricular diastolic parameters were normal. Right Ventricle: The right ventricular size is normal. No increase in right ventricular wall thickness. Global RV systolic function is has low normal systolic function. The tricuspid regurgitant velocity is 1.99 m/s, and with an assumed right atrial pressure of 15 mmHg, the estimated right ventricular systolic pressure is normal at 30.8 mmHg. Left Atrium: Left atrial size was normal in size. Right Atrium: Right atrial size was normal in size Pericardium: There is no evidence of pericardial effusion. Mitral Valve: The mitral valve is normal in structure. No evidence of mitral valve regurgitation. No evidence of mitral valve stenosis by observation. Tricuspid Valve: The tricuspid valve is normal in structure. Tricuspid valve regurgitation is mild. Aortic Valve: The aortic valve is normal in structure. Aortic valve regurgitation is not visualized. The aortic valve is structurally normal, with no evidence of sclerosis or stenosis. Aortic valve mean gradient measures 4.0 mmHg. Aortic valve peak gradient measures 6.8 mmHg. Aortic valve area, by VTI measures 1.83 cm. Pulmonic Valve: The pulmonic valve was not well  visualized. Pulmonic valve regurgitation is not visualized. Pulmonic regurgitation is not visualized. No evidence of pulmonic stenosis. Aorta: The aortic root is normal in size and structure. Pulmonary Artery: The pulmonary artery is not well seen. Venous: The inferior vena cava is dilated in size with less than 50% respiratory variability, suggesting right atrial pressure of 15 mmHg. IAS/Shunts: The interatrial septum was not well visualized. Agitated saline contrast was given intravenously to evaluate for intracardiac shunting. Saline contrast bubble study was negative, with no evidence of  any interatrial shunt.  LEFT VENTRICLE PLAX 2D LVIDd:         4.45 cm  Diastology LVIDs:         3.27 cm  LV e' lateral:   19.30 cm/s LV PW:         0.87 cm  LV E/e' lateral: 3.9 LV IVS:        0.80 cm  LV e' medial:    14.40 cm/s LVOT diam:     1.90 cm  LV E/e' medial:  5.2 LV SV:         47 ml LV SV Index:   29.02 LVOT Area:     2.84 cm  RIGHT VENTRICLE RV S prime:     11.10 cm/s TAPSE (M-mode): 1.5 cm LEFT ATRIUM             Index       RIGHT ATRIUM           Index LA diam:        3.20 cm 2.01 cm/m  RA Area:     10.10 cm LA Vol (A2C):   44.7 ml 28.07 ml/m RA Volume:   22.90 ml  14.38 ml/m LA Vol (A4C):   27.8 ml 17.46 ml/m LA Biplane Vol: 35.3 ml 22.17 ml/m  AORTIC VALVE                   PULMONIC VALVE AV Area (Vmax):    2.02 cm    PV Vmax:       0.81 m/s AV Area (Vmean):   1.71 cm    PV Peak grad:  2.7 mmHg AV Area (VTI):     1.83 cm AV Vmax:           130.00 cm/s AV Vmean:          93.100 cm/s AV VTI:            0.238 m AV Peak Grad:      6.8 mmHg AV Mean Grad:      4.0 mmHg LVOT Vmax:         92.40 cm/s LVOT Vmean:        56.000 cm/s LVOT VTI:          0.154 m LVOT/AV VTI ratio: 0.65  AORTA Ao Root diam: 2.30 cm MITRAL VALVE                        TRICUSPID VALVE MV Area (PHT): 4.36 cm             TR Peak grad:   15.8 mmHg MV PHT:        50.46 msec           TR Vmax:        199.00 cm/s MV Decel Time: 174 msec  MV E velocity: 75.20 cm/s 103 cm/s  SHUNTS MV A velocity: 44.10 cm/s 70.3 cm/s Systemic VTI:  0.15 m MV E/A ratio:  1.71       1.5       Systemic Diam: 1.90 cm  Yvonne Kendall MD Electronically signed by Yvonne Kendall MD Signature Date/Time: 02/08/2019/7:35:17 AM    Final (Updated)    CT Maxillofacial Wo Contrast  Result Date: 02/07/2019 CLINICAL DATA:  Found unresponsive. EXAM: CT MAXILLOFACIAL WITHOUT CONTRAST TECHNIQUE: Multidetector CT imaging of the maxillofacial structures was performed. Multiplanar CT image reconstructions were also generated. COMPARISON:  None. FINDINGS: Osseous: No fracture or mandibular dislocation.  No destructive process. Orbits: Negative. No traumatic or inflammatory finding. Sinuses: Mucosal thickening in the left frontal sinus and anterior left ethmoid air cells. No air-fluid levels. Mastoids clear. Soft tissues: Negative Limited intracranial: See head CT report. IMPRESSION: No acute bony abnormality in the face/orbits. Minimal chronic sinusitis changes. Electronically Signed   By: Charlett NoseKevin  Dover M.D.   On: 02/07/2019 11:02    Medications:  I have reviewed the patient's current medications. Scheduled: .  stroke: mapping our early stages of recovery book   Does not apply Once  . aspirin  300 mg Rectal Daily  . enoxaparin (LOVENOX) injection  40 mg Subcutaneous Q24H  . feeding supplement (ENSURE ENLIVE)  237 mL Oral BID BM  . insulin aspart  0-15 Units Subcutaneous Q4H  . QUEtiapine  100 mg Oral QHS    Assessment/Plan: 23 y.o. female presenting with altered mental status after being found down.  UDS positive for cocaine, benzos and THC.  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  CTA of the head and neck show no evidence of hemodynamically significant stenosis.  Echocardiogram shows no cardiac source of emboli with an EF of 50-55%.  Interatrial septum not well visualized.  A1c  5.0, LDL 35.  Recommendations: 1. ASA 81mg  and Plavix 75mg  daily to continue for 3 weeks before decrease to ASA 81mg  daily alone.   2. Telemetry monitoring 3. Frequent neuro checks 4. TEE    LOS: 1 day   Thana FarrLeslie Neamiah Sciarra, MD Neurology [phone] 02/08/2019  10:31 AM

## 2019-02-08 NOTE — Consult Note (Addendum)
Consultation Note Date: 02/08/2019   Patient Name: Jane Watkins  DOB: 17-Nov-1995  MRN: [id number]  Age / Sex: 23 y.o., female  PCP: Patient, No Pcp Per Referring Physician: Jennye Boroughs, MD  Reason for Consultation: Establishing goals of care  HPI/Patient Profile: Jane Watkins is a 23 y.o. female with medical history significant of IVDU, polysubstance abuse, anxiety, GERD, migraines presents to the emergency department after being found down/unresponsive at home.  Unknown time down.    Clinical Assessment and Goals of Care: Patient is resting in bed with eyes closed. 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 He states she interacts with both parents but favors her father more in conversation. He states they have been together for around 4 months and live together. He states he is aware of her history of drug use and hospitalizations, and intubations including tracheostomy which is well healed currently.  She has red marks on her neck which boyfriend thinks may be a spoon mark.   Per sitter, she has not eaten well today.    Attempted unsuccessfully to reach patient's father.     SUMMARY OF RECOMMENDATIONS   Will continue to follow, and will reattempt to reach father tomorrow.   Prognosis:   Unable to determine  Discharge Planning: To Be Determined      Primary Diagnoses: Present on Admission: . CVA (cerebral vascular accident) ([place])   I have reviewed the medical record, interviewed the patient and family, and examined the patient. The following aspects are pertinent.  Past Medical History:  Diagnosis Date  . AKI (acute kidney injury) ([place]) 2018   "from overdose"  . Anxiety   . Daily headache   . Depression   . Drug overdose 04/2016   [name] 05/01/2016  . GERD (gastroesophageal reflux disease)    "when I was younger; gone now" (09/21/2017)  . Migraine    "q couple  weeks" (09/21/2017)  . Overdose    Social History   Socioeconomic History  . Marital status: Legally Separated    Spouse name: Not on file  . Number of children: Not on file  . Years of education: Not on file  . Highest education level: Not on file  Occupational History  . Not on file  Tobacco Use  . Smoking status: Current Every Day Smoker    Packs/day: 0.50    Years: 10.00    Pack years: 5.00    Types: Cigarettes  . Smokeless tobacco: Never Used  Substance and Sexual Activity  . Alcohol use: No  . Drug use: Yes    Types: IV, Heroin, Cocaine    Comment: 09/21/2017 "cocaine a couple times in the last month; clean from heroin for 2 wks before yesterday; been struggling w/that for a couple years now""  . Sexual activity: Yes  Other Topics Concern  . Not on file  Social History Narrative  . Not on file   Social Determinants of Health   Financial Resource Strain:   . Difficulty of Paying Living  Expenses: Not on file  Food Insecurity:   . Worried About Programme researcher, broadcasting/film/video in the Last Year: Not on file  . Ran Out of Food in the Last Year: Not on file  Transportation Needs:   . Lack of Transportation (Medical): Not on file  . Lack of Transportation (Non-Medical): Not on file  Physical Activity:   . Days of Exercise per Week: Not on file  . Minutes of Exercise per Session: Not on file  Stress:   . Feeling of Stress : Not on file  Social Connections:   . Frequency of Communication with Friends and Family: Not on file  . Frequency of Social Gatherings with Friends and Family: Not on file  . Attends Religious Services: Not on file  . Active Member of Clubs or Organizations: Not on file  . Attends Banker Meetings: Not on file  . Marital Status: Not on file   Family History  Problem Relation Age of Onset  . Anemia Father    Scheduled Meds: .  stroke: mapping our early stages of recovery book   Does not apply Once  . aspirin  300 mg Rectal Daily  .  enoxaparin (LOVENOX) injection  40 mg Subcutaneous Q24H  . feeding supplement (ENSURE ENLIVE)  237 mL Oral TID BM  . insulin aspart  0-15 Units Subcutaneous Q4H  . [START ON 02/09/2019] multivitamin with minerals  1 tablet Oral Daily  . QUEtiapine  100 mg Oral QHS   Continuous Infusions: . sodium chloride 100 mL/hr at 02/08/19 1424  . piperacillin-tazobactam (ZOSYN)  IV 3.375 g (02/08/19 1016)  . vancomycin 1,000 mg (02/08/19 1014)   PRN Meds:.acetaminophen **OR** acetaminophen (TYLENOL) oral liquid 160 mg/5 mL **OR** acetaminophen, naLOXone (NARCAN)  injection, senna-docusate Medications Prior to Admission:  Prior to Admission medications   Not on File   Allergies  Allergen Reactions  . Other Rash    Tide detergent   . Sulfa Antibiotics Rash   Review of Systems  Unable to perform ROS   Physical Exam Constitutional:      Comments: Eyes closed.   Pulmonary:     Effort: Pulmonary effort is normal.  Skin:    Comments: Red mark on neck.      Vital Signs: BP 97/66 (BP Location: Right Arm)   Pulse 60 Comment: per moniter  Temp 98.5 F (36.9 C) (Axillary)   Resp 16   Ht 5\' 5"  (1.651 m)   Wt 59 kg   LMP 01/31/2019 Comment: neg preg test today  SpO2 100%   BMI 21.64 kg/m  Pain Scale: PAINAD   Pain Score: 0-No pain   SpO2: SpO2: 100 % O2 Device:SpO2: 100 % O2 Flow Rate: .O2 Flow Rate (L/min): 4 L/min  IO: Intake/output summary:   Intake/Output Summary (Last 24 hours) at 02/08/2019 1546 Last data filed at 02/08/2019 1528 Gross per 24 hour  Intake 986.68 ml  Output 1200 ml  Net -213.32 ml    LBM:   Baseline Weight: Weight: 59 kg Most recent weight: Weight: 59 kg     Palliative Assessment/Data:     Time In: 3:20 Time Out: 3:52 Time Total: 32 min Greater than 50%  of this time was spent counseling and coordinating care related to the above assessment and plan.  Signed by: 02/10/2019, NP   Please contact Palliative Medicine Team phone at  [phone] for questions and concerns.  For individual provider: See [phone]

## 2019-02-08 NOTE — TOC Initial Note (Signed)
Transition of Care [facility]) - Initial/Assessment Note    Patient Details  Name: Jane Watkins MRN: [id number] Date of Birth: 1996/01/29  [facility] CM/SW Contact:    Jane Butcher, RN Phone Number: 02/08/2019, 10:42 AM  Clinical Narrative:                 Patient admitted with stroke.  Patient is lying in bed with eyes closed.  Patient is not responding to questions at this time but did work with physical therapy this morning.  Physical therapy is recommending [facility].   RNCM called and spoke with patient's father Jane Watkins.  Jane Watkins reports that the family is on board with CIR.  Patient lives with her boyfriend Jane Watkins, as far as Jane Watkins is aware the patient and her boyfriend have a good relationship.  Patient works at the [employer] in [place].  Patient has insurance through her father.   Patient's father would also like to find mental health resources available in the this area for when patient gets out of rehab.  RNCM will cont to follow.    Expected Discharge Plan: IP Rehab Facility Barriers to Discharge: Continued Medical Work up   Patient Goals and CMS Choice Patient states their goals for this hospitalization and ongoing recovery are:: patient is unable to verbalize CMS Medicare.gov Compare Post Acute Care list provided to:: Patient Represenative (must comment)(father Jane Watkins) Choice offered to / list presented to : Parent  Expected Discharge Plan and Services Expected Discharge Plan: IP Rehab Facility   Discharge Planning Services: CM Consult Post Acute Care Choice: IP Rehab                                        Prior Living Arrangements/Services   Lives with:: Significant Other Patient language and need for interpreter reviewed:: Yes        Need for Family Participation in Patient Care: Yes (Comment)(stroke) Care giver support system in place?: Yes (comment)(father and mother and boyfriend)   Criminal Activity/Legal Involvement Pertinent to  Current Situation/Hospitalization: No - Comment as needed  Activities of Daily Living Home Assistive Devices/Equipment: None ADL Screening (condition at time of admission) Patient's cognitive ability adequate to safely complete daily activities?: No Is the patient deaf or have difficulty hearing?: No Does the patient have difficulty seeing, even when wearing glasses/contacts?: No Does the patient have difficulty concentrating, remembering, or making decisions?: No Patient able to express need for assistance with ADLs?: Yes Does the patient have difficulty dressing or bathing?: No Independently performs ADLs?: Yes (appropriate for developmental age) Does the patient have difficulty walking or climbing stairs?: Yes Weakness of Legs: Left Weakness of Arms/Hands: Left  Permission Sought/Granted Permission sought to share information with : Case Manager, Magazine features editor, Family Supports Permission granted to share information with : Yes, Verbal Permission Granted     Permission granted to share info w AGENCY: CIR  Permission granted to share info w Relationship: Father and Mother     Emotional Assessment Appearance:: Appears stated age       Alcohol / Substance Use: Illicit Drugs Psych Involvement: No (comment)  Admission diagnosis:  CVA (cerebral vascular accident) (HCC) [I63.9] Polysubstance abuse (HCC) [F19.10] Cerebrovascular accident (CVA), unspecified mechanism (HCC) [I63.9] Patient Active Problem List   Diagnosis Date Noted  . CVA (cerebral vascular accident) (HCC) 02/07/2019  . Cellulitis of arm 09/20/2017  . IV  drug abuse ([place]) 09/20/2017  . Quadriceps weakness 06/18/2016  . TBI (traumatic brain injury) ([place]) 05/01/2016  . 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 Respiratory distress   . Glasgow coma scale total score 3-8 ([place])   . Pneumonia of both lower lobes due to methicillin resistant Staphylococcus aureus (MRSA) ([place])   . Acute pulmonary  edema (HCC)   . Non-traumatic rhabdomyolysis   . Opioid abuse ([place])   . Elevated troponin   . Acute respiratory failure with hypoxia ([place])   . Drug overdose   . Elevated liver enzymes   . Encephalopathy acute   . Encephalopathy 04/19/2016  . Opiate overdose ([place]) 04/19/2016   PCP:  Patient, No Pcp Per Pharmacy:   [facility], [place] [place] [place] [address] Phone: [phone] Fax: [phone]  [facility] [place], [place] - [place] [address] [place] [address] Phone: [phone] Fax: [phone]     Social Determinants of Health (SDOH) Interventions    Readmission Risk Interventions No flowsheet data found.

## 2019-02-08 NOTE — Progress Notes (Signed)
CBG 69 per folowed hypoglycemic protocol : juice given will recheck in 30 minutes

## 2019-02-08 NOTE — Plan of Care (Signed)
Pt only responds when asked questions with minimal response.  Will follow request for NIH responses to evaluate stroke symptoms
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
## 2019-02-09 DIAGNOSIS — R1310 Dysphagia, unspecified: Secondary | ICD-10-CM

## 2019-02-09 DIAGNOSIS — R748 Abnormal levels of other serum enzymes: Secondary | ICD-10-CM

## 2019-02-09 LAB — GLUCOSE, CAPILLARY
Glucose-Capillary: 100 mg/dL — ABNORMAL HIGH (ref 70–99)
Glucose-Capillary: 103 mg/dL — ABNORMAL HIGH (ref 70–99)
Glucose-Capillary: 137 mg/dL — ABNORMAL HIGH (ref 70–99)
Glucose-Capillary: 78 mg/dL (ref 70–99)
Glucose-Capillary: 79 mg/dL (ref 70–99)
Glucose-Capillary: 83 mg/dL (ref 70–99)

## 2019-02-09 MED ORDER — ASPIRIN 81 MG PO CHEW
81.0000 mg | CHEWABLE_TABLET | Freq: Every day | ORAL | Status: DC
  Administered 2019-02-09 – 2019-02-16 (×8): 81 mg via ORAL
  Filled 2019-02-09 (×8): qty 1

## 2019-02-09 MED ORDER — CLOPIDOGREL BISULFATE 75 MG PO TABS
75.0000 mg | ORAL_TABLET | Freq: Every day | ORAL | Status: DC
  Administered 2019-02-09 – 2019-02-16 (×8): 75 mg via ORAL
  Filled 2019-02-09 (×8): qty 1

## 2019-02-09 MED ORDER — IBUPROFEN 400 MG PO TABS
400.0000 mg | ORAL_TABLET | Freq: Once | ORAL | Status: AC
  Administered 2019-02-09: 400 mg via ORAL
  Filled 2019-02-09: qty 1

## 2019-02-09 MED ORDER — ATORVASTATIN CALCIUM 80 MG PO TABS
80.0000 mg | ORAL_TABLET | Freq: Every day | ORAL | Status: DC
  Administered 2019-02-09 – 2019-02-15 (×7): 80 mg via ORAL
  Filled 2019-02-09 (×3): qty 1
  Filled 2019-02-09 (×3): qty 4
  Filled 2019-02-09: qty 1
  Filled 2019-02-09: qty 4
  Filled 2019-02-09: qty 1
  Filled 2019-02-09: qty 4
  Filled 2019-02-09 (×3): qty 1
  Filled 2019-02-09: qty 4

## 2019-02-09 NOTE — Progress Notes (Signed)
Pt boyfriend, [name], @ bedside @ approximately 1100. I spoke to [place] about whether or not pt was allowed visitors due to being IVC. I was educated by [place] that pt was able to have a visitor all day but that they were not allowed to bring anything in to pt at all. @ approximately 1215 NTs [place] and Lovena Le were putting pt on [facility] when I received a call that there was a prescription medication bottle on the bedside table belonging to the boyfriend.  I spoke with [name] about how he could not bring any medications, food etc into pt and that he would have to leave. [name] began to cry and repeatedly stated "No one told me. I've been coming here two days and no one told me." At that point I went to [facility] office and spoke to [place] again. She said that if he had not previously been informed then we could allow him to take stuff out to his car and educate him that he was not allowed to bring anything into the room for her. I again spoke with pt boyfriend [name] and told him that we would let him stay this time, but that he had to take anything he brought into the room back out to the car and that he could not bring it back in or he would be asked to leave and would be unable to return.  [name] stated understanding and apologized for bringing medication in, saying that it belonged to him and he needed it. I did allow pt to keep flowers that boyfriend had brought in to her. They were set on her counter by her sink. Will continue to monitor.
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
## 2019-02-09 NOTE — Progress Notes (Signed)
PROGRESS NOTE  Shanekia Latella FKC:[id number] DOB: 19-May-1995 DOA: 02/07/2019 PCP: Patient, No Pcp Per  Brief History   23 year old woman PMH IV drug use, polysubstance abuse found down unresponsive at home. 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 Complicated by suspected drug overdose. On admission not felt to be a candidate for thrombolytic therapy, neurosurgery reportedly recommended no surgical intervention. CTA head and neck no evidence of significant stenosis. Echocardiogram normal cardiac source of emboli. --Start Plavix and statin, continue aspirin but change to oral. Plavix for 3 weeks only. --CIR recommended per physical therapy --TEE planned  Elevated CK, troponin, presumably secondary to being found down --No labs today but renal function was preserved yesterday. Check CK and BMP in a.m.  Dysphagia. Diet per speech therapy 12/28 mechanical soft diet with thin liquids, aspiration precautions.  Leukocytosis thought to be reactive, 1 episode of fever yesterday. --Leukocytosis has resolved.  Stop antibiotics.  Monitor clinically.  PMH IV drug use, urine drug screen positive for cocaine, benzodiazepines and marijuana.  Tolerating diet, start Plavix, change aspirin to oral, add high-dose statin.  Needs to participate aggressively with therapy.  Neurology plans TEE.  DVT prophylaxis: enoxaparin Code Status: Full Family Communication: none Disposition Plan: CIR?    Brendia Sacks, MD  [facility] Direct contact: see [email or web address] (further directions at bottom of note if needed) 7PM-7AM contact night coverage as at bottom of note 02/09/2019, 2:19 PM  LOS: 2 days   Significant Hospital Events   . 12/28 admission for MCA infarct . 12/28 neurology consultation   Consults:  . Neurology   Procedures:  .   Significant Diagnostic Tests:  . Chest x-ray no acute  disease . CT head 12/28 area indistinct low-density right basal ganglia with mass-effect on the ventricles. Marland Kitchen MRI brain acute infarct right MCA, thought embolic. No frank hematoma. No findings to suggest septic emboli. . CTA head and neck. No major arterial occlusion or significant stenosis in the head neck. . Echocardiogram LVEF 50-55%. Low normal function. Global right ventricular systolic function normal   Micro Data:  . Covid negative   Antimicrobials:  .   Interval History/Subjective  Per tech patient doing much better, eating quite a bit better today, was able to ambulate to the commode and brush her own teeth today.  Patient indicates she is okay.  No complaints  Objective   Vitals:  Vitals:   02/09/19 0800 02/09/19 1200  BP: (!) 100/57 109/68  Pulse: 60 69  Resp: 16 17  Temp: 98 F (36.7 C) 98.1 F (36.7 C)  SpO2: 100% 100%    Exam:  Cardiovascular.  Regular rate and rhythm.  No murmur, rub or gallop. Respiratory.  Clear to auscultation bilaterally.  No wheezes, rales or rhonchi.  Normal respiratory effort. Psychiatric.  Briefly opens eyes, answers a few questions. Neurologic.  Does not comply with examination. Musculoskeletal does not comply with examination.  I have personally reviewed the following:   Today's Data  . CBG stable . No new labs  Scheduled Meds: .  stroke: mapping our early stages of recovery book   Does not apply Once  . aspirin  81 mg Oral Daily  . atorvastatin  80 mg Oral q1800  . clopidogrel  75 mg Oral Daily  . enoxaparin (LOVENOX) injection  40 mg Subcutaneous Q24H  . feeding supplement (ENSURE ENLIVE)  237 mL Oral TID BM  . multivitamin  with minerals  1 tablet Oral Daily  . QUEtiapine  100 mg Oral QHS   Continuous Infusions: . sodium chloride 100 mL/hr at 02/09/19 0136    Principal Problem:   CVA (cerebral vascular accident) [facility]) Active Problems:   IV drug abuse ([place])   Dysphagia   Elevated CK   LOS: 2 days   How to  contact the [facility] Attending or Consulting provider [place] or covering provider during after hours [place], for this patient?  1. Check the care team in [facility] and look for a) attending/consulting [facility] provider listed and b) the [facility] team listed 2. Log into [email or web address] and use [place]'s universal password to access. If you do not have the password, please contact the hospital operator. 3. Locate the [facility] provider you are looking for under [facility] and page to a number that you can be directly reached. 4. If you still have difficulty reaching the provider, please page the [facility] (Director on Call) for the Hospitalists listed on amion for assistance.
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
## 2019-02-09 NOTE — Progress Notes (Signed)
Rehab Admissions Coordinator Note:  Per PT and OT recommendation, this patient was screened by Raechel Ache for appropriateness for an Inpatient Acute Rehab Consult.  At this time, we are recommending Inpatient Rehab consult. AC will place order to allow for further assessment and evaluation of pt's candidacy for CIR.   Raechel Ache 02/09/2019, 1:43 PM  I can be reached at [phone].

## 2019-02-10 ENCOUNTER — Encounter: Payer: Self-pay | Admitting: Internal Medicine

## 2019-02-10 ENCOUNTER — Inpatient Hospital Stay: Payer: 59 | Admitting: Certified Registered Nurse Anesthetist

## 2019-02-10 ENCOUNTER — Inpatient Hospital Stay (HOSPITAL_COMMUNITY)
Admit: 2019-02-10 | Discharge: 2019-02-10 | Disposition: A | Discharge status: Home / Self Care | Payer: 59 | Attending: Physician Assistant | Admitting: Physician Assistant

## 2019-02-10 ENCOUNTER — Encounter
Admission: EM | Disposition: A | Discharge status: Rehabilitation Facility | Payer: Self-pay | Source: Home / Self Care | Attending: Family Medicine

## 2019-02-10 DIAGNOSIS — F3342 Major depressive disorder, recurrent, in full remission: Secondary | ICD-10-CM | POA: Diagnosis present

## 2019-02-10 DIAGNOSIS — I6389 Other cerebral infarction: Secondary | ICD-10-CM

## 2019-02-10 DIAGNOSIS — F331 Major depressive disorder, recurrent, moderate: Secondary | ICD-10-CM | POA: Diagnosis present

## 2019-02-10 HISTORY — PX: TEE WITHOUT CARDIOVERSION: SHX5443

## 2019-02-10 LAB — BASIC METABOLIC PANEL
Anion gap: 8 (ref 5–15)
BUN: 7 mg/dL (ref 6–20)
CO2: 24 mmol/L (ref 22–32)
Calcium: 8.4 mg/dL — ABNORMAL LOW (ref 8.9–10.3)
Chloride: 114 mmol/L — ABNORMAL HIGH (ref 98–111)
Creatinine, Ser: 0.47 mg/dL (ref 0.44–1.00)
GFR calc Af Amer: >60 mL/min (ref >60)
GFR calc non Af Amer: >60 mL/min (ref >60)
Glucose, Bld: 84 mg/dL (ref 70–99)
Potassium: 3.3 mmol/L — ABNORMAL LOW (ref 3.5–5.1)
Sodium: 146 mmol/L — ABNORMAL HIGH (ref 135–145)

## 2019-02-10 LAB — URINE DRUG SCREEN, QUALITATIVE (ARMC ONLY)
Amphetamines, Ur Screen: NOT DETECTED
Barbiturates, Ur Screen: NOT DETECTED
Benzodiazepine, Ur Scrn: NOT DETECTED
Cannabinoid 50 Ng, Ur ~~LOC~~: POSITIVE — AB
Cocaine Metabolite,Ur ~~LOC~~: NOT DETECTED
MDMA (Ecstasy)Ur Screen: NOT DETECTED
Methadone Scn, Ur: NOT DETECTED
Opiate, Ur Screen: NOT DETECTED
Phencyclidine (PCP) Ur S: NOT DETECTED
Tricyclic, Ur Screen: NOT DETECTED

## 2019-02-10 LAB — GLUCOSE, CAPILLARY
Glucose-Capillary: 77 mg/dL (ref 70–99)
Glucose-Capillary: 77 mg/dL (ref 70–99)
Glucose-Capillary: 132 mg/dL — ABNORMAL HIGH (ref 70–99)

## 2019-02-10 LAB — CK: Total CK: 2245 U/L — ABNORMAL HIGH (ref 38–234)

## 2019-02-10 SURGERY — ECHOCARDIOGRAM, TRANSESOPHAGEAL
Anesthesia: General

## 2019-02-10 MED ORDER — FLUOXETINE HCL 20 MG PO CAPS
20.0000 mg | ORAL_CAPSULE | Freq: Every day | ORAL | Status: DC
  Administered 2019-02-11 – 2019-02-16 (×6): 20 mg via ORAL
  Filled 2019-02-10 (×7): qty 1

## 2019-02-10 MED ORDER — MIDAZOLAM HCL 2 MG/2ML IJ SOLN
INTRAMUSCULAR | Status: AC
  Filled 2019-02-10: qty 2

## 2019-02-10 MED ORDER — PROPOFOL 10 MG/ML IV BOLUS
INTRAVENOUS | Status: DC | PRN
  Administered 2019-02-10 (×2): 30 mg via INTRAVENOUS
  Administered 2019-02-10: 50 mg via INTRAVENOUS
  Administered 2019-02-10: 20 mg via INTRAVENOUS
  Administered 2019-02-10: 50 mg via INTRAVENOUS
  Administered 2019-02-10: 20 mg via INTRAVENOUS

## 2019-02-10 MED ORDER — ENOXAPARIN SODIUM 40 MG/0.4ML ~~LOC~~ SOLN
40.0000 mg | SUBCUTANEOUS | Status: DC
  Administered 2019-02-12: 11:00:00 40 mg via SUBCUTANEOUS
  Filled 2019-02-10 (×5): qty 0.4

## 2019-02-10 MED ORDER — HYDROXYZINE HCL 25 MG PO TABS
25.0000 mg | ORAL_TABLET | Freq: Three times a day (TID) | ORAL | Status: DC | PRN
  Administered 2019-02-10 – 2019-02-14 (×3): 25 mg via ORAL
  Filled 2019-02-10 (×4): qty 1

## 2019-02-10 MED ORDER — SODIUM CHLORIDE 0.9 % IV SOLN: INTRAVENOUS | Status: DC

## 2019-02-10 MED ORDER — PROPOFOL 10 MG/ML IV BOLUS
INTRAVENOUS | Status: AC
  Filled 2019-02-10: qty 20

## 2019-02-10 MED ORDER — QUETIAPINE FUMARATE 25 MG PO TABS
25.0000 mg | ORAL_TABLET | Freq: Every morning | ORAL | Status: DC
  Administered 2019-02-11 – 2019-02-16 (×6): 25 mg via ORAL
  Filled 2019-02-10 (×6): qty 1

## 2019-02-10 MED ORDER — LIDOCAINE VISCOUS HCL 2 % MT SOLN
OROMUCOSAL | Status: AC
  Filled 2019-02-10: qty 15

## 2019-02-10 MED ORDER — BUTAMBEN-TETRACAINE-BENZOCAINE 2-2-14 % EX AERO
INHALATION_SPRAY | CUTANEOUS | Status: AC
  Filled 2019-02-10: qty 5

## 2019-02-10 MED ORDER — SODIUM CHLORIDE FLUSH 0.9 % IV SOLN
INTRAVENOUS | Status: AC
  Filled 2019-02-10: qty 10

## 2019-02-10 MED ORDER — POTASSIUM CHLORIDE CRYS ER 20 MEQ PO TBCR
40.0000 meq | EXTENDED_RELEASE_TABLET | Freq: Once | ORAL | Status: AC
  Administered 2019-02-10: 17:00:00 40 meq via ORAL
  Filled 2019-02-10: qty 2

## 2019-02-10 MED ORDER — POTASSIUM CHLORIDE 10 MEQ/100ML IV SOLN
10.0000 meq | INTRAVENOUS | Status: AC
  Filled 2019-02-10: qty 100

## 2019-02-10 NOTE — Progress Notes (Signed)
SLP Cancellation Note  Patient Details Name: Jane Watkins MRN: [id number] DOB: [birth year]   Cancelled treatment:       Reason Eval/Treat Not Completed: (chart reviewed; consulted NSG re: pt's status overall). Pt has been tolerating a mech soft/regular diet for the past 2 days per NSG w/out any overt s/s of aspiration noted. Labs and temps unchanged, wnl. 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 Pt is primarily sleepy but does awaken to eat and engage w/ boyfriend who visits, [facility] staff. She is verbal indicating her general wants/needs including foods.  In light of her toleration of diet w/out any reported s/s of aspiration noted by [facility] staff, Sitter in room, no further skilled ST services indicated for swallowing. With regard to her Cognitive functioning, pt has a baseline of Cognitive deficits w/ f/u skilled therapy in 2018 s/p overdose then. She is currently communicating adequately w/ NSG staff. Recommend f/u at next venue of care post acute admission for formal assessment if any concerns of change/decline from her baseline prior. NSG updated and agreed.  Recommend ongoing general aspiration precautions and reducing distractions during communication/conversation.     Orinda Kenner, MS, CCC-SLP Sharryn Belding 02/10/2019, 10:16 AM

## 2019-02-10 NOTE — Progress Notes (Addendum)
PROGRESS NOTE  Jane Watkins VQM:[id number] DOB: 08/28/1995 DOA: 02/07/2019 PCP: Patient, No Pcp Per  Brief History   23 year old woman PMH IV drug use, polysubstance abuse found down unresponsive at home. 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 Complicated by suspected drug overdose. On admission not felt to be a candidate for thrombolytic therapy, neurosurgery reportedly recommended no surgical intervention. CTA head and neck no evidence of significant stenosis. Echocardiogram normal cardiac source of emboli. --Continue aspirin, Plavix and statin. Plavix for 3 weeks only.  Finished Plavix 1/20. --CIR recommended per physical therapy --TEE done today, unremarkable  Elevated CK, troponin, presumably secondary to being found down --Potassium slightly low but BMP otherwise unremarkable, normal renal function.  Dysphagia. Diet per speech therapy 12/28 mechanical soft diet with thin liquids, aspiration precautions. --Tolerating diet  Leukocytosis thought to be reactive, 1 episode of fever yesterday. --No further fever fever.  Leukocytosis resolved.  PMH IV drug use, urine drug screen positive for cocaine, benzodiazepines and marijuana.  Comment.  Appears to be stable.  Discussed with Dr. Doy Mince, neurology, her work-up is complete.  TEE was unremarkable.  Patient cleared for CIR.  Bedside RN and TOC notified.  DVT prophylaxis: enoxaparin Code Status: Full Family Communication: none Disposition Plan: CIR?    Murray Hodgkins, MD  [facility] Direct contact: see [email or web address] (further directions at bottom of note if needed) 7PM-7AM contact night coverage as at bottom of note 02/10/2019, 1:07 PM  LOS: 3 days   Significant Hospital Events   . 12/28 admission for MCA infarct . 12/28 neurology consultation   Consults:  . Neurology   Procedures:  .   Significant Diagnostic  Tests:  . Chest x-ray no acute disease . CT head 12/28 area indistinct low-density right basal ganglia with mass-effect on the ventricles. [facility] MRI brain acute infarct right MCA, thought embolic. No frank hematoma. No findings to suggest septic emboli. . CTA head and neck. No major arterial occlusion or significant stenosis in the head neck. . Echocardiogram LVEF 50-55%. Low normal function. Global right ventricular systolic function normal . 12/31 transesophageal echocardiogram, normal LV function, no PFO, ASD, VSD or thrombus   Micro Data:  . Covid negative   Antimicrobials:  .   Interval History/Subjective  Has been doing okay, tolerating diet, able to get up.  Left leg more for problem the left arm.  Objective   Vitals:  Vitals:   02/10/19 1010 02/10/19 1202  BP: 115/74 (!) 119/94  Pulse: (!) 51 (!) 58  Resp: (!) 21   Temp:  98.7 F (37.1 C)  SpO2: 100% 94%    Exam:  Constitutional.  Appears calm, comfortable. Psychiatric.  Participates with examination, answers a few questions. Cardiovascular.  Regular rate and rhythm.  No murmur, rub or gallop.  No significant lower extremity edema. Respiratory.  Clear to auscultation bilaterally.  No wheezes, rales or rhonchi.  Normal respiratory effort. Musculoskeletal.  Left upper and left lower extremity weakness noted.  I have personally reviewed the following:   Today's Data  . CBG stable.  No lows. . Potassium 3.3, remainder BMP unremarkable. . CK trending down, 2245  Scheduled Meds: .  stroke: mapping our early stages of recovery book   Does not apply Once  . aspirin  81 mg Oral Daily  . atorvastatin  80 mg Oral q1800  . clopidogrel  75 mg Oral Daily  . enoxaparin (LOVENOX)  injection  40 mg Subcutaneous Q24H  . feeding supplement (ENSURE ENLIVE)  237 mL Oral TID BM  . lidocaine      . multivitamin with minerals  1 tablet Oral Daily  . QUEtiapine  100 mg Oral QHS  . sodium chloride flush       Continuous Infusions:  . sodium chloride 100 mL/hr at 02/09/19 2359    Principal Problem:   CVA (cerebral vascular accident) [facility]) Active Problems:   IV drug abuse (HCC)   Dysphagia   Elevated CK   LOS: 3 days   How to contact the [facility] Attending or Consulting provider 7A - 7P or covering provider during after hours 7P -7A, for this patient?  1. Check the care team in [facility] and look for a) attending/consulting TR[facility] provider listed and b) the [facility] team listed 2. Log into [email or web address] and use [place]'s universal password to access. If you do not have the password, please contact the hospital operator. 3. Locate the [facility] provider you are looking for under [facility] and page to a number that you can be directly reached. 4. If you still have difficulty reaching the provider, please page the [facility] (Director on Call) for the Hospitalists listed on amion for assistance.

## 2019-02-10 NOTE — Progress Notes (Signed)
Pt boyfriend Jane Watkins returned from leaving hospital and brought back with him a body pillow, food, and drinks. Per my note from yesterday pt had been made aware that he was not allowed to bring anything into the pt room or he would have to leave and would not be allowed back. I spoke with charge nurse Anderson Malta and together we walked to pt room. I spoke with pt boyfriend outside of 89 and informed him that he would have to leave because he violated the rules. I told him I would give him 10 minutes to say goodbye and gather his belongings. At that time he stated "I think that's childish, but whatever. Why can't I just take the stuff back out to my car. I tried to call and ask if I could bring the pillow when I talked to Freda Munro and she transferred me, but I couldn't reach you." I reiterated to him that I had already told him that he couldn't bring anything the previous day and that meant he wasn't to bring anything in. Pt then stormed off into the room and picked up pt tray and threw it. At that point security was called and pt boyfriend was escorted out of the building. Medical Jane Watkins has been informed that Jane Watkins, pt boyfriend and designated visitor is not to be allowed back.

## 2019-02-10 NOTE — Anesthesia Postprocedure Evaluation (Signed)
Anesthesia Post Note  Patient: Jane Watkins  Procedure(s) Performed: TRANSESOPHAGEAL ECHOCARDIOGRAM (TEE) (N/A )  Patient location during evaluation: PACU Anesthesia Type: General Level of consciousness: oriented and sedated Pain management: pain level controlled Vital Signs Assessment: post-procedure vital signs reviewed and stable Respiratory status: spontaneous breathing Cardiovascular status: blood pressure returned to baseline Anesthetic complications: no     Last Vitals:  Vitals:   02/10/19 1000 02/10/19 1010  BP: 120/86 115/74  Pulse: 77 (!) 51  Resp: 15 (!) 21  Temp:    SpO2: 99% 100%    Last Pain:  Vitals:   02/10/19 1010  TempSrc:   PainSc: Asleep                 Doris Mcgilvery

## 2019-02-10 NOTE — Progress Notes (Signed)
Subjective: Speech improved but continues to have significant left sided weakness  Objective: Current vital signs: BP (!) 119/94 (BP Location: Left Arm)   Pulse (!) 58   Temp 98.7 F (37.1 C)   Resp (!) 21   Ht 5\' 5"  (1.651 m)   Wt 59 kg   LMP 01/31/2019 Comment: neg preg test today  SpO2 94%   BMI 21.64 kg/m  Vital signs in last 24 hours: Temp:  [98.2 F (36.8 C)-98.8 F (37.1 C)] 98.7 F (37.1 C) (12/31 1202) Pulse Rate:  [44-136] 58 (12/31 1202) Resp:  [11-34] 21 (12/31 1010) BP: (98-133)/(67-94) 119/94 (12/31 1202) SpO2:  [94 %-100 %] 94 % (12/31 1202)  Intake/Output from previous day: 12/30 0701 - 12/31 0700 In: 120 [P.O.:120] Out: -  Intake/Output this shift: Total I/O In: 400 [I.V.:400] Out: -  Nutritional status:  Diet Order    None      Neurologic Exam: Mental Status: Lethargic. Speech minimal but intelligible. Follows simple commands.  Cranial Nerves: II: Discs flat bilaterally;Blinks to bilateral confrontation. Pupils equal, round, reactive to light and accommodation III,IV, ZO:XWRUVI:EOMs intact V,VII:left facial droop VIII: hearing normal bilaterally IX,X: gag reflexreduced EA:VWUJWJXI:unable to test XII: midline tongue extension Motor: Able to move the right upper and right lower extremities well against gravity. No movement noted in the LUE.  1-2/5 in the LLE Sensory:Responds to light noxious stimuli throughout  Lab Results: Basic Metabolic Panel: Recent Labs  Lab 02/07/19 0938 02/07/19 1421 02/08/19 0705 02/08/19 1800 02/10/19 0305  NA 140 140  --  140 146*  K 5.2* 4.9  --  3.9 3.3*  CL 110 111  --  111 114*  CO2 20* 22  --  24 24  GLUCOSE 199* 100*  --  101* 84  BUN 25* 22*  --  7 7  CREATININE 0.67 0.66 0.48 0.56 0.47  CALCIUM 8.6* 8.0*  --  8.4* 8.4*    Liver Function Tests: Recent Labs  Lab 02/07/19 0938  AST 31  ALT 58*  ALKPHOS 83  BILITOT 0.9  PROT 7.9  ALBUMIN 4.6   No results for input(s): LIPASE, AMYLASE in the  last 168 hours. No results for input(s): AMMONIA in the last 168 hours.  CBC: Recent Labs  Lab 02/07/19 0938 02/08/19 1800  WBC 20.7* 9.4  NEUTROABS 18.9* 5.7  HGB 13.8 11.2*  HCT 42.3 35.3*  MCV 86.7 91.2  PLT 277 212    Cardiac Enzymes: Recent Labs  Lab 02/07/19 0938 02/07/19 1421 02/10/19 0305  CKTOTAL 418* 4,477* 2,245*    Lipid Panel: Recent Labs  Lab 02/08/19 0705  CHOL 87  TRIG 87  HDL 35*  CHOLHDL 2.5  VLDL 17  LDLCALC 35    CBG: Recent Labs  Lab 02/09/19 1247 02/09/19 1603 02/09/19 2025 02/10/19 0417 02/10/19 1159  GLUCAP 100* 103* 137* 77 77    Microbiology: Results for orders placed or performed during the hospital encounter of 02/07/19  SARS CORONAVIRUS 2 (TAT 6-24 HRS) Nasopharyngeal Nasopharyngeal Swab     Status: None   Collection Time: 02/07/19 10:32 AM   Specimen: Nasopharyngeal Swab  Result Value Ref Range Status   SARS Coronavirus 2 NEGATIVE NEGATIVE Final    Comment: (NOTE) SARS-CoV-2 target nucleic acids are NOT DETECTED. The SARS-CoV-2 RNA is generally detectable in upper and lower respiratory specimens during the acute phase of infection. Negative results do not preclude SARS-CoV-2 infection, do not rule out co-infections with other pathogens, and should not be used  as the sole basis for treatment or other patient management decisions. Negative results must be combined with clinical observations, patient history, and epidemiological information. The expected result is Negative. Fact Sheet for Patients: HairSlick.no Fact Sheet for Healthcare Providers: quierodirigir.com This test is not yet approved or cleared by the [place] FDA and  has been authorized for detection and/or diagnosis of SARS-CoV-2 by FDA under an Emergency Use Authorization (EUA). This EUA will remain  in effect (meaning this test can be used) for the duration of the COVID-19 declaration under  Section 56 4(b)(1) of the Act, 21 U.S.C. section 360bbb-3(b)(1), unless the authorization is terminated or revoked sooner. Performed at [facility], [address] [place], [place] [address]   Blood culture (routine x 2)     Status: None (Preliminary result)   Collection Time: 02/07/19 11:34 AM   Specimen: BLOOD  Result Value Ref Range Status   Specimen Description BLOOD BLOOD RIGHT ARM  Final   Special Requests   Final    BOTTLES DRAWN AEROBIC AND ANAEROBIC Blood Culture results may not be optimal due to an excessive volume of blood received in culture bottles   Culture   Final    NO GROWTH 3 DAYS Performed at [facility], [address], [place], [place] [address]    Report Status PENDING  Incomplete  Blood culture (routine x 2)     Status: None (Preliminary result)   Collection Time: 02/07/19 11:34 AM   Specimen: BLOOD  Result Value Ref Range Status   Specimen Description BLOOD BLOOD LEFT FOREARM  Final   Special Requests   Final    BOTTLES DRAWN AEROBIC AND ANAEROBIC Blood Culture results may not be optimal due to an excessive volume of blood received in culture bottles   Culture   Final    NO GROWTH 3 DAYS Performed at [facility], [address] [place], [place] [address]    Report Status PENDING  Incomplete  CULTURE, BLOOD (ROUTINE X 2) w Reflex to ID Panel     Status: None (Preliminary result)   Collection Time: 02/08/19  5:42 PM   Specimen: BLOOD  Result Value Ref Range Status   Specimen Description BLOOD RIGHT ANTECUBITAL  Final   Special Requests   Final    BOTTLES DRAWN AEROBIC AND ANAEROBIC Blood Culture adequate volume   Culture   Final    NO GROWTH 2 DAYS Performed at [facility], [address], [place], [place] [address]    Report Status PENDING  Incomplete  CULTURE, BLOOD (ROUTINE X 2) w Reflex to ID Panel     Status: None (Preliminary result)   Collection Time: 02/08/19  6:01 PM   Specimen: BLOOD  Result Value  Ref Range Status   Specimen Description BLOOD BLOOD LEFT HAND  Final   Special Requests   Final    BOTTLES DRAWN AEROBIC AND ANAEROBIC Blood Culture adequate volume   Culture   Final    NO GROWTH 2 DAYS Performed at [facility], [address] [place], [place] [address]    Report Status PENDING  Incomplete    Coagulation Studies: No results for input(s): LABPROT, INR in the last 72 hours.  Imaging: ECHO TEE  Result Date: 02/10/2019   TRANSESOPHOGEAL ECHO REPORT   Patient Name:   Jane Watkins Date of Exam: 02/10/2019 Medical Rec #:  [id number]  Height:       65.0 in Accession #:    [id number]  Weight:       130.1 lb Date of Birth:  10/26/1995   BSA:          1.65 m Patient Age:    23 years   BP:           126/79 mmHg Patient Gender: F          HR:           61 bpm. Exam Location:  [facility]  Procedure: Transesophageal Echo, Color Doppler, Cardiac Doppler and Saline            Contrast Bubble Study Indications:     Stroke 434.91  History:         Patient has prior history of Echocardiogram examinations, most                  recent 02/07/2019. Anxiety, migraine , Overdose.  Sonographer:     Cristela Blue RDCS (AE) Referring Phys:  [id number] Lennon Alstrom Diagnosing Phys: Debbe Odea MD  PROCEDURE: The transesophogeal probe was passed through the esophogus of the patient. Image quality was good. The patient developed no complications during the procedure. TEE SUMMARY  There was no evidence of thrombus or vegetation/endocarditis in this study.  IMPRESSIONS  1. Left ventricular ejection fraction, by visual estimation, is 55 to 60%. The left ventricle has normal function. There is no left ventricular hypertrophy.  2. The left ventricle has no regional wall motion abnormalities.  3. Global right ventricle has normal systolic function.The right ventricular size is normal. No increase in right ventricular wall thickness.  4. Left atrial size was normal.  5. Right atrial size was normal.  6. The  mitral valve is normal in structure. Trivial mitral valve regurgitation.  7. The tricuspid valve is normal in structure.  8. The aortic valve is normal in structure. Aortic valve regurgitation is not visualized.  9. The pulmonic valve was grossly normal. Pulmonic valve regurgitation is not visualized. FINDINGS  Left Ventricle: Left ventricular ejection fraction, by visual estimation, is 55 to 60%. The left ventricle has normal function. The left ventricle has no regional wall motion abnormalities. The left ventricular internal cavity size was the left ventricle is normal in size. There is no left ventricular hypertrophy. Right Ventricle: The right ventricular size is normal. No increase in right ventricular wall thickness. Global RV systolic function is has normal systolic function. Left Atrium: Left atrial size was normal in size. Right Atrium: Right atrial size was normal in size Pericardium: There is no evidence of pericardial effusion. Mitral Valve: The mitral valve is normal in structure. Trivial mitral valve regurgitation. Tricuspid Valve: The tricuspid valve is normal in structure. Tricuspid valve regurgitation is trivial. Aortic Valve: The aortic valve is normal in structure. Aortic valve regurgitation is not visualized. Pulmonic Valve: The pulmonic valve was grossly normal. Pulmonic valve regurgitation is not visualized. Aorta: The aortic root is normal in size and structure. Venous: The inferior vena cava was not well visualized. Shunts: Agitated saline contrast was given intravenously to evaluate for intracardiac shunting. Saline contrast bubble study was negative, with no evidence of any interatrial shunt. No ventricular septal defect is seen or detected. There is no evidence of an atrial septal defect. No atrial level shunt detected by color flow Doppler.  Debbe Odea MD Electronically signed by Debbe Odea MD Signature Date/Time: 02/10/2019/10:53:50 AM    Final     Medications:  I have  reviewed the patient's current medications. Scheduled: .  stroke: mapping  our early stages of recovery book   Does not apply Once  . aspirin  81 mg Oral Daily  . atorvastatin  80 mg Oral q1800  . clopidogrel  75 mg Oral Daily  . enoxaparin (LOVENOX) injection  40 mg Subcutaneous Q24H  . feeding supplement (ENSURE ENLIVE)  237 mL Oral TID BM  . lidocaine      . multivitamin with minerals  1 tablet Oral Daily  . QUEtiapine  100 mg Oral QHS  . sodium chloride flush        Assessment/Plan: 23 y.o.femalepresenting with altered mental status after being found down. UDS positive for cocaine, benzos and THC. 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 CTA of the head and neck show no evidence of hemodynamically significant stenosis.  Echocardiogram shows no cardiac source of emboli with an EF of 50-55%.  TEE unremarkable.  A1c 5.0, LDL 35.  Recommendations: 1. ASA 81mg  and Plavix 75mg  daily to continue for 3 weeks before decrease to ASA 81mg  daily alone.  2. Telemetry monitoring 3. Frequent neuro checks 4. Continued therapy   LOS: 3 days   Alexis Goodell, MD Neurology [id number] 02/10/2019  2:06 PM
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
## 2019-02-10 NOTE — Progress Notes (Signed)
*  PRELIMINARY RESULTS* Echocardiogram Echocardiogram Transesophageal has been performed.  Sherrie Sport 02/10/2019, 10:04 AM

## 2019-02-10 NOTE — Anesthesia Post-op Follow-up Note (Signed)
Anesthesia QCDR form completed.        

## 2019-02-10 NOTE — Progress Notes (Signed)
PT Cancellation Note  Patient Details Name: Jane Watkins MRN: [id number] DOB: Jun 25, 1995   Cancelled Treatment:    Reason Eval/Treat Not Completed: Patient at procedure or test/unavailable.  Chart reviewed.  Upon PT arrival to pt's room, pt (and pt's bed) gone; nurse reports pt off floor for TEE.  Will re-attempt PT treatment session at a later date/time.  Leitha Bleak, PT 02/10/19, 8:44 AM

## 2019-02-10 NOTE — Procedures (Signed)
Transesophageal Echocardiogram :  Indication: stroke Requesting/ordering  physician: Dr. Sarajane Jews  Procedure: 4cc of viscous lidocaine were given orally to provide local anesthesia to the oropharynx. The patient was positioned supine on the left side, bite block provided. The patient was sedated with propofol by anesthesia.  Using digital technique an omniplane probe was advanced into the esophagus without incident.   Moderate sedation: 1. Sedation performed by anesthesia  See report in EPIC  for complete details: In brief, imaging revealed normal LV function with no RWMAs and no mural thrombus noted. Estimated ejection fraction was 55%.  Right sided cardiac chambers were normal with no evidence of pulmonary hypertension.  Imaging of the septum showed no ASD or VSD Bubble study was negative for shunt 2D and color flow confirmed no PFO  The LA was well visualized in orthogonal views.  There was no spontaneous contrast and no thrombus in the LA and LA appendage.  The valves were visualized with no evidence for infection or endocarditis.  The descending thoracic aorta had no  mural aortic debris with no evidence of aneurysmal dilation or disection   Jane Watkins 02/10/2019 9:43 AM

## 2019-02-10 NOTE — Consult Note (Addendum)
Physical Medicine and Rehabilitation Consult Reason for Consult: Impaired mobility and ADLs following CVA Referring Physician: Dr. Brendia Sacksaniel Goodrich   HPI: Jane EvangelistLydia Watkins is a 23 y.o. 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 Urine drug screen was positive for benzodiazepines, cocaine, and marijuana.  She is left-handed and works as a Production designer, theatre/television/filmmanager at [employer]. She lives with her boy friend.   TEE yesterday.   ROS negative except as indicated in HPI. Past Medical History:  Diagnosis Date  . AKI (acute kidney injury) (HCC) 2018   "from overdose"  . Anxiety   . Daily headache   . Depression   . Drug overdose 04/2016   [name]/notes 05/01/2016  . GERD (gastroesophageal reflux disease)    "when I was younger; gone now" (09/21/2017)  . Migraine    "q couple weeks" (09/21/2017)  . Overdose    Past Surgical History:  Procedure Laterality Date  . APPENDECTOMY  04/2013  . I & D EXTREMITY Left 09/20/2017   Procedure: IRRIGATION AND DEBRIDEMENT LEFT ARM;  Surgeon: Bradly Bienenstockrtmann, Fred, MD;  Location: [facility] OR;  Service: Orthopedics;  Laterality: Left;  . TONGUE SURGERY  ~ 2007   "related to lisp"  . TRACHEOSTOMY  04/2016   [name]/notes 05/01/2016   Family History  Problem Relation Age of Onset  . Anemia Father    Social History:  reports that she has been smoking cigarettes. She has a 5.00 pack-year smoking history. She has never used smokeless tobacco. She reports current drug use. Drugs: IV, Heroin, and Cocaine. She reports that she does not drink alcohol. Allergies:  Allergies  Allergen Reactions  . Other Rash    Tide detergent   . Sulfa Antibiotics Rash   No medications prior to admission.    Home: Home Living Family/patient expects to be discharged to:: Private residence Living Arrangements: (boyfriend) Type of Home:  Apartment Home Access: Level entry Home Layout: One level Bathroom Shower/Tub: Engineer, manufacturing systemsTub/shower unit Bathroom Toilet: Standard Home Equipment: None Additional Comments: boyfriend present, provided home set up information, as pt unable to verbalize  Functional History: Prior Function Level of Independence: Independent Comments: Per boyfriend/chart, pt indep and working Functional Status:  Mobility: Bed Mobility Overal bed mobility: Needs Assistance Bed Mobility: Supine to Sit Supine to sit: Mod assist, Max assist, HOB elevated Sit to supine: Min assist, Mod assist, +2 for physical assistance General bed mobility comments: assist for trunk and L>R LE (sat up on L side of bed); with cueing pt able to use bottom of bed rail with R UE to scoot towards edge of bed with CGA for safety Transfers Overall transfer level: Needs assistance Equipment used: 1 person hand held assist Transfers: Sit to/from Stand, Stand Pivot Transfers Sit to Stand: Min assist, +2 physical assistance Stand pivot transfers: Min assist, +2 physical assistance General transfer comment: min assist x2 to perform stand pivot [facility] to R (cueing for positioning and R UE placement); x2 trials standing [facility] with min assist x2 (1st trial pt stood with assist for clean-up post toileting and assist with donning underwear; pt then sat back down in order to set-up for mobility to recliner) Ambulation/Gait Ambulation/Gait assistance: Min assist, Mod assist, +2 physical assistance Gait Distance (Feet): 3 Feet Assistive device: 1 person hand held assist General Gait Details: pt walked forward (from  BSC towards recliner), then turned to the R, then took a couple steps backwards, and then sat in recliner with 2 assist; assist to advance L LE each step (muscle activation noted L hip flexors though); assist to block L knee and make sure L ankle properly aligned prior to taking a step with R LE; vc's to shift weight to R in order to  advance L LE; hand hold assist with R UE and assist for L UE support during activity Gait velocity: decreased    ADL: ADL Overall ADL's : Needs assistance/impaired Eating/Feeding: Set up, Minimal assistance Eating/Feeding Details (indicate cue type and reason): Limited endurance for self-feeding  applesauce using her nondominant RUE. Pt. is able to perform hand to mouth patterns with the utensil, and scooping complete set-up is provided, and stabilized in front of her. Grooming: Maximal assistance Upper Body Bathing: Maximal assistance Lower Body Bathing: Total assistance Upper Body Dressing : Total assistance Lower Body Dressing: Total assistance Toileting- Clothing Manipulation and Hygiene: Total assistance General ADL Comments: limited ADL assessment 2/2 decreased arousal/alertness, per nurse tech/sitter present, pt able to attempt self feeding this morning after PT session using non-dom R hand but had difficult performing 2/2 decreased ability to maintain alertness for task  Cognition: Cognition Overall Cognitive Status: Difficult to assess Orientation Level: Oriented X4 Cognition Arousal/Alertness: Awake/alert Behavior During Therapy: Flat affect Overall Cognitive Status: Difficult to assess General Comments: Pt occasionally speaking during session; otherwise intermittently nodding head yes/no to questions Difficult to assess due to: Impaired communication  Blood pressure 115/74, pulse (!) 51, temperature 98.8 F (37.1 C), temperature source Oral, resp. rate (!) 21, height 5\' 5"  (1.651 m), weight 59 kg, last menstrual period 01/31/2019, SpO2 100 %. Physical Exam General: Alert and oriented x 3, No apparent distress HEENT: Head is normocephalic, atraumatic, PERRLA, EOMI, sclera anicteric, oral mucosa pink and moist, dentition intact, ext ear canals clear,  Neck: Supple without JVD or lymphadenopathy Heart: Reg rate and rhythm. No murmurs rubs or gallops Chest: CTA bilaterally  without wheezes, rales, or rhonchi; no distress Abdomen: Soft, non-tender, non-distended, bowel sounds positive. Extremities: No clubbing, cyanosis, or edema. Pulses are 2+ Skin: Clean and intact without signs of breakdown Neuro/Musculoskeletal:: Pt is cognitively appropriate with normal insight, memory, and awareness. Cranial nerves 2-12 are intact except for left facial droop. Sensory exam is normal. Reflexes are 2+ in all 4's. Fine motor coordination is intact. No tremors. Motor function is grossly 5/5 on right side and 0/5 on left side, exam limited by patient's fatigue Psych: Pt's affect flat, fatiged   Results for orders placed or performed during the hospital encounter of 02/07/19 (from the past 24 hour(s))  Glucose, capillary     Status: Abnormal   Collection Time: 02/09/19 12:47 PM  Result Value Ref Range   Glucose-Capillary 100 (H) 70 - 99 mg/dL   Comment 1 Notify RN   Glucose, capillary     Status: Abnormal   Collection Time: 02/09/19  4:03 PM  Result Value Ref Range   Glucose-Capillary 103 (H) 70 - 99 mg/dL   Comment 1 Notify RN   Glucose, capillary     Status: Abnormal   Collection Time: 02/09/19  8:25 PM  Result Value Ref Range   Glucose-Capillary 137 (H) 70 - 99 mg/dL  BMP in AM     Status: Abnormal   Collection Time: 02/10/19  3:05 AM  Result Value Ref Range   Sodium 146 (H) 135 - 145 mmol/L   Potassium 3.3 (  L) 3.5 - 5.1 mmol/L   Chloride 114 (H) 98 - 111 mmol/L   CO2 24 22 - 32 mmol/L   Glucose, Bld 84 70 - 99 mg/dL   BUN 7 6 - 20 mg/dL   Creatinine, Ser 0.47 0.44 - 1.00 mg/dL   Calcium 8.4 (L) 8.9 - 10.3 mg/dL   GFR calc non Af Amer >60 >60 mL/min   GFR calc Af Amer >60 >60 mL/min   Anion gap 8 5 - 15  CK     Status: Abnormal   Collection Time: 02/10/19  3:05 AM  Result Value Ref Range   Total CK 2,245 (H) 38 - 234 U/L  Glucose, capillary     Status: None   Collection Time: 02/10/19  4:17 AM  Result Value Ref Range   Glucose-Capillary 77 70 - 99 mg/dL   Urine Drug Screen, Qualitative ([facility] only)     Status: Abnormal   Collection Time: 02/10/19  7:33 AM  Result Value Ref Range   Tricyclic, Ur Screen NONE DETECTED NONE DETECTED   Amphetamines, Ur Screen NONE DETECTED NONE DETECTED   MDMA (Ecstasy)Ur Screen NONE DETECTED NONE DETECTED   Cocaine Metabolite,Ur [place] NONE DETECTED NONE DETECTED   Opiate, Ur Screen NONE DETECTED NONE DETECTED   Phencyclidine (PCP) Ur S NONE DETECTED NONE DETECTED   Cannabinoid 50 Ng, Ur  POSITIVE (A) NONE DETECTED   Barbiturates, Ur Screen NONE DETECTED NONE DETECTED   Benzodiazepine, Ur Scrn NONE DETECTED NONE DETECTED   Methadone Scn, Ur NONE DETECTED NONE DETECTED   ECHO TEE  Result Date: 02/10/2019   TRANSESOPHOGEAL ECHO REPORT   Patient Name:   Jane Watkins Date of Exam: 02/10/2019 Medical Rec #:  [id number]  Height:       65.0 in Accession #:    [id number] Weight:       130.1 lb Date of Birth:  05-18-1995   BSA:          1.65 m Patient Age:    23 years   BP:           126/79 mmHg Patient Gender: F          HR:           61 bpm. Exam Location:  [facility]  Procedure: Transesophageal Echo, Color Doppler, Cardiac Doppler and Saline            Contrast Bubble Study Indications:     Stroke 434.91  History:         Patient has prior history of Echocardiogram examinations, most                  recent 02/07/2019. Anxiety, migraine , Overdose.  Sonographer:     Sherrie Sport RDCS (AE) Referring Phys:  [id number] Arvil Chaco Diagnosing Phys: [name] MD  PROCEDURE: The transesophogeal probe was passed through the esophogus of the patient. Image quality was good. The patient developed no complications during the procedure. TEE SUMMARY  There was no evidence of thrombus or vegetation/endocarditis in this study.  IMPRESSIONS  1. Left ventricular ejection fraction, by visual estimation, is 55 to 60%. The left ventricle has normal function. There is no left ventricular hypertrophy.  2. The left ventricle has no regional wall  motion abnormalities.  3. Global right ventricle has normal systolic function.The right ventricular size is normal. No increase in right ventricular wall thickness.  4. Left atrial size was normal.  5. Right atrial size was normal.  6. The  mitral valve is normal in structure. Trivial mitral valve regurgitation.  7. The tricuspid valve is normal in structure.  8. The aortic valve is normal in structure. Aortic valve regurgitation is not visualized.  9. The pulmonic valve was grossly normal. Pulmonic valve regurgitation is not visualized. FINDINGS  Left Ventricle: Left ventricular ejection fraction, by visual estimation, is 55 to 60%. The left ventricle has normal function. The left ventricle has no regional wall motion abnormalities. The left ventricular internal cavity size was the left ventricle is normal in size. There is no left ventricular hypertrophy. Right Ventricle: The right ventricular size is normal. No increase in right ventricular wall thickness. Global RV systolic function is has normal systolic function. Left Atrium: Left atrial size was normal in size. Right Atrium: Right atrial size was normal in size Pericardium: There is no evidence of pericardial effusion. Mitral Valve: The mitral valve is normal in structure. Trivial mitral valve regurgitation. Tricuspid Valve: The tricuspid valve is normal in structure. Tricuspid valve regurgitation is trivial. Aortic Valve: The aortic valve is normal in structure. Aortic valve regurgitation is not visualized. Pulmonic Valve: The pulmonic valve was grossly normal. Pulmonic valve regurgitation is not visualized. Aorta: The aortic root is normal in size and structure. Venous: The inferior vena cava was not well visualized. Shunts: Agitated saline contrast was given intravenously to evaluate for intracardiac shunting. Saline contrast bubble study was negative, with no evidence of any interatrial shunt. No ventricular septal defect is seen or detected. There is no  evidence of an atrial septal defect. No atrial level shunt detected by color flow Doppler.  Debbe Odea MD Electronically signed by Debbe Odea MD Signature Date/Time: 02/10/2019/10:53:50 AM    Final      Assessment/Plan: Diagnosis: Impaired mobility and ADLs secondary to right MCA stroke 1. Does the need for close, 24 hr/day medical supervision in concert with the patient's rehab needs make it unreasonable for this patient to be served in a less intensive setting? Yes 2. Co-Morbidities requiring supervision/potential complications: UDS positive for cocaine, benzos, THC, major depressive disorder, dysphagia, elevated CK and liver enzymes 3. Due to bladder management, bowel management, safety, skin/wound care, disease management, medication administration, pain management and patient education, does the patient require 24 hr/day rehab nursing? Yes 4. Does the patient require coordinated care of a physician, rehab nurse, therapy disciplines of PT, OT, SLP to address physical and functional deficits in the context of the above medical diagnosis(es)? Yes Addressing deficits in the following areas: balance, endurance, locomotion, strength, transferring, bowel/bladder control, bathing, dressing, feeding, grooming, toileting, cognition, speech, language, swallowing and psychosocial support 5. Can the patient actively participate in an intensive therapy program of at least 3 hrs of therapy per day at least 5 days per week? Yes 6. The potential for patient to make measurable gains while on inpatient rehab is good 7. Anticipated functional outcomes upon discharge from inpatient rehab are modified independent  with PT, modified independent with OT, independent with SLP. 8. Estimated rehab length of stay to reach the above functional goals is: 20-22 days 9. Anticipated discharge destination: Home 10. Overall Rehab/Functional Prognosis: good  RECOMMENDATIONS: This patient's condition is appropriate  for continued rehabilitative care in the following setting: CIR Patient has agreed to participate in recommended program. Yes Note that insurance prior authorization may be required for reimbursement for recommended care.  Comment: Patient has dense left-sided hemiplegia and is left handed and was independent prior to stroke. She would benefit from inpatient rehab. Would need  to establish family support prior to rehab admission. As per nursing note, boy friend is not allowed to visit due to poor compliance with rules and disruptive visit.   Horton Chin, MD 02/10/2019

## 2019-02-10 NOTE — Anesthesia Preprocedure Evaluation (Addendum)
Anesthesia Evaluation  Patient identified by MRN, date of birth, ID band Patient awake    Reviewed: Allergy & Precautions, NPO status , Patient's Chart, lab work & pertinent test results  Airway Mallampati: I  TM Distance: >3 FB Neck ROM: Full    Dental   Pulmonary pneumonia, resolved, Current Smoker,    Pulmonary exam normal        Cardiovascular negative cardio ROS Normal cardiovascular exam     Neuro/Psych  Headaches, PSYCHIATRIC DISORDERS Anxiety Depression  Neuromuscular disease CVA    GI/Hepatic GERD  Controlled,(+)     substance abuse  cocaine use,   Endo/Other    Renal/GU Renal disease  negative genitourinary   Musculoskeletal  (+) narcotic dependent  Abdominal   Peds negative pediatric ROS (+)  Hematology negative hematology ROS (+)   Anesthesia Other Findings Past Medical History: 2018: AKI (acute kidney injury) ([facility])     Comment:  "from overdose" No date: Anxiety No date: Daily headache No date: Depression 04/2016: Drug overdose     Comment:  [name] 05/01/2016 No date: GERD (gastroesophageal reflux disease)     Comment:  "when I was younger; gone now" (09/21/2017) No date: Migraine     Comment:  "q couple weeks" (09/21/2017) No date: Overdose  Reproductive/Obstetrics                             Anesthesia Physical  Anesthesia Plan  ASA: III  Anesthesia Plan: General   Post-op Pain Management:    Induction: Intravenous  PONV Risk Score and Plan: 2 and Propofol infusion  Airway Management Planned: Nasal Cannula  Additional Equipment:   Intra-op Plan:   Post-operative Plan:   Informed Consent: I have reviewed the patients History and Physical, chart, labs and discussed the procedure including the risks, benefits and alternatives for the proposed anesthesia with the patient or authorized representative who has indicated his/her understanding and acceptance.        Plan Discussed with: CRNA and Surgeon  Anesthesia Plan Comments: (Talked with patient's mother and discussed the risks of the procedure .  The mother understands the risks and wishes to proceed with the procedure.)       Anesthesia Quick Evaluation

## 2019-02-10 NOTE — Progress Notes (Signed)
OT Cancellation Note  Patient Details Name: [name] MRN: [id number] DOB: [birth year]   Cancelled Treatment:    Reason Eval/Treat Not Completed: Patient at procedure or test/ unavailable. Pt out of room for procedure. Will re-attempt at later date/time as pt is available.   [name], MPH, MS, OTR/L ascom [phone] 02/10/19, 8:46 AM

## 2019-02-10 NOTE — Transfer of Care (Signed)
Immediate Anesthesia Transfer of Care Note  Patient: Jane Watkins  Procedure(s) Performed: TRANSESOPHAGEAL ECHOCARDIOGRAM (TEE) (N/A )  Patient Location: PACU  Anesthesia Type:General  Level of Consciousness: sedated  Airway & Oxygen Therapy: Patient Spontanous Breathing and Patient connected to nasal cannula oxygen  Post-op Assessment: Report given to RN and Post -op Vital signs reviewed and stable  Post vital signs: Reviewed and stable  Last Vitals:  Vitals Value Taken Time  BP 126/79 02/10/19 0936  Temp    Pulse 61 02/10/19 0939  Resp 22 02/10/19 0939  SpO2 97 % 02/10/19 0939    Last Pain:  Vitals:   02/10/19 0849  TempSrc: Oral  PainSc: 0-No pain         Complications: No apparent anesthesia complications

## 2019-02-10 NOTE — Consult Note (Addendum)
[facility] Face-to-Face Psychiatry Consult   Reason for Consult:  Depression Referring Physician:  Hospitalist Patient Identification: Jane Watkins MRN:  [id number] Principal Diagnosis: CVA (cerebral vascular accident) [facility]) Diagnosis:  Principal Problem:   CVA (cerebral vascular accident) (HCC) Active Problems:   IV drug abuse (HCC)   Dysphagia   Elevated CK   MDD (major depressive disorder), recurrent episode, moderate (HCC)   Total Time spent with patient: 45 minutes  Subjective:   Jane Watkins is a 23 y.o. 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  Patient reports today that she has been abusing numerous drugs specifically recently and has been cocaine mainly.  She thought that she is using some heroin however she states that she has found recently that her UDS was only positive for benzodiazepines, cocaine, and marijuana.  Patient denies any suicidal or homicidal ideations and denies any hallucinations.  Patient's boyfriend is in the room with her during the evaluation which she agreed he could stay.  He also reports that there was no concern for suicidal intent with this drug use.  She states that she has that with depression for a while and that has caused her to go back to using drugs most the time.  She states that she has been on Prozac in the past and it did help, however, she had to continually have the doses increased to maintain stability, however she continued using substances during this time.  Patient reports that she still dealing with some depression especially with her current medical situation and also has some issues with anxiety as well as some difficulty with sleep.  HPI:  23 year old woman PMH IV drug use, polysubstance abuse found down unresponsive at home. Found to have acute right MCA stroke with left-sided hemiparesis  Patient is seen by this provider via face-to-face with  the patient's boyfriend present.  The patient has continually denied having any suicidal or homicidal ideations and denies any hallucinations.  Patient does admit to having chronic substance abuse as well as dealing with depression and anxiety.  Patient did report using Prozac with benefit.  I have consulted with Dr. Jola Babinski and the recommendation was to continue the current Seroquel 100 mg p.o. nightly, start Seroquel 25 mg p.o. daily to assist with anxiety and to restart the patient's Prozac at 20 mg p.o. daily to assist with anxiety as well as depression.  These may be titrated as needed.  I have notified the hospitalist to secure chat about the recommendations.  At this time the patient does not meet inpatient criteria.  Past Psychiatric History: MDD, polysubstance abuse, patient denied any suicide attempts  Risk to Self:   Risk to Others:   Prior Inpatient Therapy:   Prior Outpatient Therapy:    Past Medical History:  Past Medical History:  Diagnosis Date   AKI (acute kidney injury) (HCC) 2018   "from overdose"   Anxiety    Daily headache    Depression    Drug overdose 04/2016   [name] 05/01/2016   GERD (gastroesophageal reflux disease)    "when I was younger; gone now" (09/21/2017)   Migraine    "q couple weeks" (09/21/2017)   Overdose     Past Surgical History:  Procedure Laterality Date   APPENDECTOMY  04/2013   I & D EXTREMITY Left 09/20/2017   Procedure: IRRIGATION AND DEBRIDEMENT LEFT ARM;  Surgeon: Bradly Bienenstock, MD;  Location: [facility] OR;  Service:  Orthopedics;  Laterality: Left;   TONGUE SURGERY  ~ 2007   "related to lisp"   TRACHEOSTOMY  04/2016   [name] 05/01/2016   Family History:  Family History  Problem Relation Age of Onset   Anemia Father    Family Psychiatric  History: None reported Social History:  Social History   Substance and Sexual Activity  Alcohol Use No     Social History   Substance and Sexual Activity  Drug Use Yes   Types: IV, Heroin, Cocaine    Comment: 09/21/2017 "cocaine a couple times in the last month; clean from heroin for 2 wks before yesterday; been struggling w/that for a couple years now""    Social History   Socioeconomic History   Marital status: Legally Separated    Spouse name: Not on file   Number of children: Not on file   Years of education: Not on file   Highest education level: Not on file  Occupational History   Not on file  Tobacco Use   Smoking status: Current Every Day Smoker    Packs/day: 0.50    Years: 10.00    Pack years: 5.00    Types: Cigarettes   Smokeless tobacco: Never Used  Substance and Sexual Activity   Alcohol use: No   Drug use: Yes    Types: IV, Heroin, Cocaine    Comment: 09/21/2017 "cocaine a couple times in the last month; clean from heroin for 2 wks before yesterday; been struggling w/that for a couple years now""   Sexual activity: Yes  Other Topics Concern   Not on file  Social History Narrative   Not on file   Social Determinants of Health   Financial Resource Strain:    Difficulty of Paying Living Expenses: Not on file  Food Insecurity:    Worried About Charity fundraiser in the Last Year: Not on file   [employer] of Food in the Last Year: Not on file  Transportation Needs:    Lack of Transportation (Medical): Not on file   Lack of Transportation (Non-Medical): Not on file  Physical Activity:    Days of Exercise per Week: Not on file   Minutes of Exercise per Session: Not on file  Stress:    Feeling of Stress : Not on file  Social Connections:    Frequency of Communication with Friends and Family: Not on file   Frequency of Social Gatherings with Friends and Family: Not on file   Attends Religious Services: Not on file   Active Member of Clubs or Organizations: Not on file   Attends Archivist Meetings: Not on file   Marital Status: Not on file   Additional Social History:    Allergies:   Allergies  Allergen Reactions   Other Rash    Tide detergent     Sulfa Antibiotics Rash    Labs:  Results for orders placed or performed during the hospital encounter of 02/07/19 (from the past 48 hour(s))  Glucose, capillary     Status: None   Collection Time: 02/08/19  3:45 PM  Result Value Ref Range   Glucose-Capillary 75 70 - 99 mg/dL  CULTURE, BLOOD (ROUTINE X 2) w Reflex to ID Panel     Status: None (Preliminary result)   Collection Time: 02/08/19  5:42 PM   Specimen: BLOOD  Result Value Ref Range   Specimen Description BLOOD RIGHT ANTECUBITAL    Special Requests      BOTTLES DRAWN AEROBIC  AND ANAEROBIC Blood Culture adequate volume   Culture      NO GROWTH 2 DAYS Performed at [facility], [address] [place], [place] [address]    Report Status PENDING   CBC with Differential/Platelet     Status: Abnormal   Collection Time: 02/08/19  6:00 PM  Result Value Ref Range   WBC 9.4 4.0 - 10.5 K/uL   RBC 3.87 3.87 - 5.11 MIL/uL   Hemoglobin 11.2 (L) 12.0 - 15.0 g/dL   HCT 63.7 (L) 85.8 - 85.0 %   MCV 91.2 80.0 - 100.0 fL   MCH 28.9 26.0 - 34.0 pg   MCHC 31.7 30.0 - 36.0 g/dL   RDW 27.7 (H) 41.2 - 87.8 %   Platelets 212 150 - 400 K/uL   nRBC 0.0 0.0 - 0.2 %   Neutrophils Relative % 60 %   Neutro Abs 5.7 1.7 - 7.7 K/uL   Lymphocytes Relative 31 %   Lymphs Abs 2.9 0.7 - 4.0 K/uL   Monocytes Relative 8 %   Monocytes Absolute 0.7 0.1 - 1.0 K/uL   Eosinophils Relative 1 %   Eosinophils Absolute 0.1 0.0 - 0.5 K/uL   Basophils Relative 0 %   Basophils Absolute 0.0 0.0 - 0.1 K/uL   Immature Granulocytes 0 %   Abs Immature Granulocytes 0.02 0.00 - 0.07 K/uL    Comment: Performed at [facility], [address] [place], [place] [address]  Basic metabolic panel     Status: Abnormal   Collection Time: 02/08/19  6:00 PM  Result Value Ref Range   Sodium 140 135 - 145 mmol/L   Potassium 3.9 3.5 - 5.1 mmol/L   Chloride 111 98 - 111 mmol/L   CO2 24 22 - 32 mmol/L   Glucose, Bld 101 (H) 70 - 99 mg/dL   BUN 7 6 - 20  mg/dL   Creatinine, Ser 0.94 0.44 - 1.00 mg/dL   Calcium 8.4 (L) 8.9 - 10.3 mg/dL   GFR calc non Af Amer >60 >60 mL/min   GFR calc Af Amer >60 >60 mL/min   Anion gap 5 5 - 15    Comment: Performed at [facility], [address], [place], [place] [address]  CULTURE, BLOOD (ROUTINE X 2) w Reflex to ID Panel     Status: None (Preliminary result)   Collection Time: 02/08/19  6:01 PM   Specimen: BLOOD  Result Value Ref Range   Specimen Description BLOOD BLOOD LEFT HAND    Special Requests      BOTTLES DRAWN AEROBIC AND ANAEROBIC Blood Culture adequate volume   Culture      NO GROWTH 2 DAYS Performed at [facility], [address], [place], [place] [address]    Report Status PENDING   Glucose, capillary     Status: None   Collection Time: 02/09/19 12:24 AM  Result Value Ref Range   Glucose-Capillary 79 70 - 99 mg/dL  Glucose, capillary     Status: None   Collection Time: 02/09/19  4:01 AM  Result Value Ref Range   Glucose-Capillary 83 70 - 99 mg/dL  Glucose, capillary     Status: None   Collection Time: 02/09/19  8:05 AM  Result Value Ref Range   Glucose-Capillary 78 70 - 99 mg/dL  Glucose, capillary     Status: Abnormal   Collection Time: 02/09/19 12:47 PM  Result Value Ref Range   Glucose-Capillary 100 (H) 70 - 99 mg/dL  Comment 1 Notify RN   Glucose, capillary     Status: Abnormal   Collection Time: 02/09/19  4:03 PM  Result Value Ref Range   Glucose-Capillary 103 (H) 70 - 99 mg/dL   Comment 1 Notify RN   Glucose, capillary     Status: Abnormal   Collection Time: 02/09/19  8:25 PM  Result Value Ref Range   Glucose-Capillary 137 (H) 70 - 99 mg/dL  BMP in AM     Status: Abnormal   Collection Time: 02/10/19  3:05 AM  Result Value Ref Range   Sodium 146 (H) 135 - 145 mmol/L   Potassium 3.3 (L) 3.5 - 5.1 mmol/L   Chloride 114 (H) 98 - 111 mmol/L   CO2 24 22 - 32 mmol/L   Glucose, Bld 84 70 - 99 mg/dL   BUN 7 6 - 20 mg/dL   Creatinine, Ser 1.61 0.44  - 1.00 mg/dL   Calcium 8.4 (L) 8.9 - 10.3 mg/dL   GFR calc non Af Amer >60 >60 mL/min   GFR calc Af Amer >60 >60 mL/min   Anion gap 8 5 - 15    Comment: Performed at [facility], [address] [place], [place] [address]  CK     Status: Abnormal   Collection Time: 02/10/19  3:05 AM  Result Value Ref Range   Total CK 2,245 (H) 38 - 234 U/L    Comment: Performed at [facility], [address] [place], [place] [address]  Glucose, capillary     Status: None   Collection Time: 02/10/19  4:17 AM  Result Value Ref Range   Glucose-Capillary 77 70 - 99 mg/dL  Urine Drug Screen, Qualitative ([facility] only)     Status: Abnormal   Collection Time: 02/10/19  7:33 AM  Result Value Ref Range   Tricyclic, Ur Screen NONE DETECTED NONE DETECTED   Amphetamines, Ur Screen NONE DETECTED NONE DETECTED   MDMA (Ecstasy)Ur Screen NONE DETECTED NONE DETECTED   Cocaine Metabolite,Ur [place] NONE DETECTED NONE DETECTED   Opiate, Ur Screen NONE DETECTED NONE DETECTED   Phencyclidine (PCP) Ur S NONE DETECTED NONE DETECTED   Cannabinoid 50 Ng, Ur [place] POSITIVE (A) NONE DETECTED   Barbiturates, Ur Screen NONE DETECTED NONE DETECTED   Benzodiazepine, Ur Scrn NONE DETECTED NONE DETECTED   Methadone Scn, Ur NONE DETECTED NONE DETECTED    Comment: (NOTE) Tricyclics + metabolites, urine    Cutoff 1000 ng/mL Amphetamines + metabolites, urine  Cutoff 1000 ng/mL MDMA (Ecstasy), urine              Cutoff 500 ng/mL Cocaine Metabolite, urine          Cutoff 300 ng/mL Opiate + metabolites, urine        Cutoff 300 ng/mL Phencyclidine (PCP), urine         Cutoff 25 ng/mL Cannabinoid, urine                 Cutoff 50 ng/mL Barbiturates + metabolites, urine  Cutoff 200 ng/mL Benzodiazepine, urine              Cutoff 200 ng/mL Methadone, urine                   Cutoff 300 ng/mL The urine drug screen provides only a preliminary, unconfirmed analytical test result and should not be used for non-medical purposes.  Clinical consideration and professional judgment should be applied to any positive drug screen result due to possible  interfering substances. A more specific alternate chemical method must be used in order to obtain a confirmed analytical result. Gas chromatography / mass spectrometry (GC/MS) is the preferred confirmat ory method. Performed at [facility], [address] [place], [place] [address]   Glucose, capillary     Status: None   Collection Time: 02/10/19 11:59 AM  Result Value Ref Range   Glucose-Capillary 77 70 - 99 mg/dL    Current Facility-Administered Medications  Medication Dose Route Frequency Provider Last Rate Last Admin    stroke: mapping our early stages of recovery book   Does not apply Once Lolita PatellaSreenath, Sudheer B, MD       0.9 %  sodium chloride infusion   Intravenous Continuous Lolita PatellaSreenath, Sudheer B, MD 100 mL/hr at 02/09/19 2359 Restarted at 02/10/19 0847   acetaminophen (TYLENOL) tablet 650 mg  650 mg Oral Q4H PRN Lolita PatellaSreenath, Sudheer B, MD       Or   acetaminophen (TYLENOL) 160 MG/5ML solution 650 mg  650 mg Per Tube Q4H PRN Lolita PatellaSreenath, Sudheer B, MD       Or   acetaminophen (TYLENOL) suppository 650 mg  650 mg Rectal Q4H PRN Lolita PatellaSreenath, Sudheer B, MD   650 mg at 02/08/19 1702   aspirin chewable tablet 81 mg  81 mg Oral Daily Standley BrookingGoodrich, Daniel P, MD   81 mg at 02/09/19 1845   atorvastatin (LIPITOR) tablet 80 mg  80 mg Oral q1800 Standley BrookingGoodrich, Daniel P, MD   80 mg at 02/09/19 1845   clopidogrel (PLAVIX) tablet 75 mg  75 mg Oral Daily Standley BrookingGoodrich, Daniel P, MD   75 mg at 02/09/19 1845   [START ON 02/11/2019] enoxaparin (LOVENOX) injection 40 mg  40 mg Subcutaneous Q24H Standley BrookingGoodrich, Daniel P, MD       feeding supplement (ENSURE ENLIVE) (ENSURE ENLIVE) liquid 237 mL  237 mL Oral TID BM Lurene ShadowAyiku, Bernard, MD       FLUoxetine (PROZAC) capsule 20 mg  20 mg Oral Daily Standley BrookingGoodrich, Daniel P, MD       lidocaine (XYLOCAINE) 2 % viscous mouth solution            multivitamin with minerals  tablet 1 tablet  1 tablet Oral Daily Lurene ShadowAyiku, Bernard, MD   1 tablet at 02/09/19 0911   naloxone [facility](NARCAN) injection 0.4 mg  0.4 mg Intravenous PRN Lolita PatellaSreenath, Sudheer B, MD       potassium chloride SA (KLOR-CON) CR tablet 40 mEq  40 mEq Oral Once Standley BrookingGoodrich, Daniel P, MD       QUEtiapine (SEROQUEL) tablet 100 mg  100 mg Oral QHS Georgeann OppenheimSreenath, Sudheer B, MD   100 mg at 02/07/19 2124   [START ON 02/11/2019] QUEtiapine (SEROQUEL) tablet 25 mg  25 mg Oral q morning - 10a Standley BrookingGoodrich, Daniel P, MD       senna-docusate (Senokot-S) tablet 1 tablet  1 tablet Oral QHS PRN Georgeann OppenheimSreenath, Sudheer B, MD       sodium chloride flush 0.9 % injection             Musculoskeletal: Strength & Muscle Tone:  Left-sided weakness due to to the MCA Gait & Station:  Patient remained in bed during evaluation Patient leans: N/A  Psychiatric Specialty Exam: Physical Exam  Nursing note and vitals reviewed. Constitutional: She is oriented to person, place, and time. She appears well-developed and well-nourished.  Respiratory: Effort normal.  Neurological: She is alert and oriented to person, place, and time.  Skin: Skin is warm.  Review of Systems  Constitutional: Negative.   HENT: Negative.   Eyes: Negative.   Respiratory: Negative.   Cardiovascular: Negative.   Genitourinary: Negative.   Skin: Negative.   Neurological: Negative.   Psychiatric/Behavioral: Positive for dysphoric mood. The patient is nervous/anxious.     Blood pressure (!) 119/94, pulse (!) 58, temperature 98.7 F (37.1 C), resp. rate (!) 21, height  (1.651 m), weight 59 kg, last menstrual period 01/31/2019, SpO2 94 %.Body mass index is 21.64 kg/m.  General Appearance: Casual  Eye Contact:  Fair  Speech:  Clear and Coherent and Normal Rate  Volume:  Decreased  Mood:  Depressed  Affect:  Flat  Thought Process:  Coherent and Descriptions of Associations: Intact  Orientation:  Full (Time, Place, and Person)  Thought Content:  WDL  Suicidal Thoughts:   No  Homicidal Thoughts:  No  Memory:  Immediate;   Good Recent;   Good Remote;   Good  Judgement:  Fair  Insight:  Fair  Psychomotor Activity:  Normal  Concentration:  Concentration: Good  Recall:  Good  Fund of Knowledge:  Fair  Language:  Fair  Akathisia:  No  Handed:  Right  AIMS (if indicated):     Assets:  Communication Skills Desire for Improvement Housing Resilience Social Support  ADL's:  Impaired  Cognition:  WNL  Sleep:        Treatment Plan Summary: Medication management Continue Seroquel 100 mg p.o. nightly Start Seroquel 25 mg p.o. daily Start Prozac 20 mg p.o. daily Patient was asked to consider residential rehab for substance abuse when she gets them with her neuro rehab and patient refused at this time but stated that she would think about reconsidering it.  Would recommend social work consult to assist with this as patient progresses through her hospital stay  Disposition: No evidence of imminent risk to self or others at present.   Patient does not meet criteria for psychiatric inpatient admission.  Gerlene Burdock Money, FNP 02/10/2019 3:24 PM  Case discussed and plan agreed upon as outlined above.

## 2019-02-11 DIAGNOSIS — F331 Major depressive disorder, recurrent, moderate: Secondary | ICD-10-CM

## 2019-02-11 LAB — GLUCOSE, CAPILLARY
Glucose-Capillary: 119 mg/dL — ABNORMAL HIGH (ref 70–99)
Glucose-Capillary: 95 mg/dL (ref 70–99)

## 2019-02-11 NOTE — Progress Notes (Signed)
Daily Progress Note   Patient Name: Jane Watkins       Date: 02/11/2019 DOB: 06/13/1995  Age: 24 y.o. MRN#: [id number] Attending Physician: Samuella Cota, MD Primary Care Physician: Patient, No Pcp Per Admit Date: 02/07/2019  Reason for Consultation/Follow-up: Establishing goals of care  Subjective: Patient is resting in bed with lights off and sitter present. She responds to initial questions with a soft voice and 1 word responses, and then drifts off to sleep. She denies pain or other complaint. She states she would like to see one of her parents. Covid restrictions explained and inquired which parent, and she states "whichever".    Called to speak with patient's mother. She states she is being updated and understands her daughter's plan of care. She states she and her husband have been discussing changing to a new visitor for the patient. She states she and the patient's father will talk further about this change.    Length of Stay: 4  Current Medications: Scheduled Meds:  .  stroke: mapping our early stages of recovery book   Does not apply Once  . aspirin  81 mg Oral Daily  . atorvastatin  80 mg Oral q1800  . clopidogrel  75 mg Oral Daily  . enoxaparin (LOVENOX) injection  40 mg Subcutaneous Q24H  . feeding supplement (ENSURE ENLIVE)  237 mL Oral TID BM  . FLUoxetine  20 mg Oral Daily  . multivitamin with minerals  1 tablet Oral Daily  . QUEtiapine  100 mg Oral QHS  . QUEtiapine  25 mg Oral q morning - 10a    Continuous Infusions: . sodium chloride 100 mL/hr at 02/11/19 0329    PRN Meds: acetaminophen **OR** acetaminophen (TYLENOL) oral liquid 160 mg/5 mL **OR** acetaminophen, hydrOXYzine, naLOXone (NARCAN)  injection, senna-docusate  Physical Exam Pulmonary:     Effort:  Pulmonary effort is normal.  Neurological:     Mental Status: She is alert.             Vital Signs: BP 130/69 (BP Location: Left Arm)   Pulse (!) 59   Temp 98.5 F (36.9 C)   Resp 16   Ht 5\' 5"  (1.651 m)   Wt 59 kg   LMP 01/31/2019 Comment: neg preg test today  SpO2 100%   BMI 21.64 kg/m  SpO2: SpO2: 100 % O2 Device: O2 Device: Room Air O2 Flow Rate: O2 Flow Rate (L/min): 4 L/min  Intake/output summary:   Intake/Output Summary (Last 24 hours) at 02/11/2019 1012 Last data filed at 02/11/2019 0329 Gross per 24 hour  Intake 730.87 ml  Output --  Net 730.87 ml   LBM: Last BM Date: 02/10/19 Baseline Weight: Weight: 59 kg Most recent weight: Weight: 59 kg       Palliative Assessment/Data:      Patient Active Problem List   Diagnosis Date Noted  . MDD (major depressive disorder), recurrent episode, moderate (HCC) 02/10/2019  . Dysphagia 02/09/2019  . Elevated CK 02/09/2019  . CVA (cerebral vascular accident) (HCC) 02/07/2019  . Cellulitis of arm 09/20/2017  . IV drug abuse (HCC) 09/20/2017  . Quadriceps weakness 06/18/2016  . TBI (traumatic brain injury) (HCC) 05/01/2016  . 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 Respiratory distress   . Glasgow coma scale total score 3-8 (HCC)   . Pneumonia of both lower lobes due to methicillin resistant Staphylococcus aureus (MRSA) (HCC)   . Acute pulmonary edema (HCC)   . Non-traumatic rhabdomyolysis   . Opioid abuse (HCC)   . Elevated troponin   . Acute respiratory failure with hypoxia (HCC)   . Drug overdose   . Elevated liver enzymes   . Encephalopathy acute   . Encephalopathy 04/19/2016  . Opiate overdose (HCC) 04/19/2016    Palliative Care Assessment & Plan     Recommendations/Plan: Recommend inpatient CIR/neurorehab, and inpatient drug rehab to follow if patient is amenable.     Code Status:    Code Status Orders  (From admission, onward)         Start     Ordered   02/07/19 1307  Full code   Continuous     02/07/19 1309        Code Status History    Date Active Date Inactive Code Status Order ID Comments User Context   09/20/2017 0709 09/24/2017 1915 Full Code [id number]  Elwyn Reach ED   05/01/2016 1650 05/09/2016 1251 Full Code [id number]  Charlton Amor, PA-C Inpatient   05/01/2016 1650 05/01/2016 1650 Full Code [id number]  Lynnae Prude Inpatient   04/19/2016 2319 05/01/2016 1639 Full Code [id number]  Jamie Kato, MD Inpatient   Advance Care Planning Activity      Prognosis:  Unable to determine    Care plan was discussed with RN  Thank you for allowing the Palliative Medicine Team to assist in the care of this patient.   Time In: 9:40 Time Out: 10:30 Total Time 50 min Prolonged Time Billed  no       Greater than 50%  of this time was spent counseling and coordinating care related to the above assessment and plan.  Morton Stall, NP  Please contact Palliative Medicine Team phone at [phone] for questions and concerns.

## 2019-02-11 NOTE — Progress Notes (Signed)
PT Cancellation Note  Patient Details Name: Akari Defelice MRN: [id number] DOB: 01-Jun-1995   Cancelled Treatment:    Reason Eval/Treat Not Completed: Fatigue/lethargy limiting ability to participate;Other (comment)   Attempted x 2.  Pt asleep x 2 on right side.  Did not respond to encouragement to participate.  Will continue as appropriate.   Danielle Dess 02/11/2019, 12:06 PM

## 2019-02-11 NOTE — Progress Notes (Signed)
PROGRESS NOTE  Jane Watkins SWF:[id number] DOB: 1995/03/08 DOA: 02/07/2019 PCP: Patient, No Pcp Per  Brief History   24 year old woman PMH IV drug use, polysubstance abuse found down unresponsive at home. 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 Complicated by suspected drug overdose. On admission not felt to be a candidate for thrombolytic therapy, neurosurgery reportedly recommended no surgical intervention. CTA head and neck no evidence of significant stenosis. Echocardiogram normal cardiac source of emboli.  TEE unremarkable. --Remained stable.  Continue aspirin, Plavix and statin. Plavix for 3 weeks only.  Finished Plavix 1/20. --CIR recommended per physical therapy  Elevated CK, troponin, presumably secondary to being found down --Potassium slightly low but BMP otherwise unremarkable, normal renal function.  CK trending down, no further evaluation suggested.  Dysphagia. Diet per speech therapy 12/28 mechanical soft diet with thin liquids, aspiration precautions. --Tolerating diet.  Depression.  Appreciate psychiatry consultation.  Continue Seroquel at night, add Seroquel during the day, start Prozac. --Recommend substance abuse rehab after neuro rehab --Discussed with psychiatry, no imminent risk of self-harm, does not meet criteria for psychiatric admission, IVC rescinded 12/31 in discussion with Mr. Liliane Shi.  Leukocytosis thought to be reactive, 1 episode of fever yesterday. --No further fever fever.  Leukocytosis resolved.  PMH IV drug use, urine drug screen positive for cocaine, benzodiazepines and marijuana.  Comment.  Remains stable for transfer to CIR.  DVT prophylaxis: enoxaparin Code Status: Full Family Communication: none Disposition Plan: CIR?    Brendia Sacks, MD  [facility] Direct contact: see [email or web address] (further directions at bottom of note if  needed) 7PM-7AM contact night coverage as at bottom of note 02/11/2019, 3:16 PM  LOS: 4 days   Significant Hospital Events   . 12/28 admission for MCA infarct . 12/28 neurology consultation   Consults:  . Neurology   Procedures:  .   Significant Diagnostic Tests:  . Chest x-ray no acute disease . CT head 12/28 area indistinct low-density right basal ganglia with mass-effect on the ventricles. Marland Kitchen MRI brain acute infarct right MCA, thought embolic. No frank hematoma. No findings to suggest septic emboli. . CTA head and neck. No major arterial occlusion or significant stenosis in the head neck. . Echocardiogram LVEF 50-55%. Low normal function. Global right ventricular systolic function normal . 12/31 transesophageal echocardiogram, normal LV function, no PFO, ASD, VSD or thrombus   Micro Data:  . Covid negative   Antimicrobials:  .   Interval History/Subjective  No new issues.  Seems to feel okay.  Objective   Vitals:  Vitals:   02/11/19 0810 02/11/19 1221  BP: 130/69 118/73  Pulse: (!) 59 (!) 56  Resp: 16 17  Temp: 98.5 F (36.9 C) 98.8 F (37.1 C)  SpO2: 100% 100%    Exam:  Constitutional.  Appears calm, comfortable. Cardiovascular.  Regular rate and rhythm.  No murmur, rub or gallop.  No significant lower extremity edema. Respiratory.  Clear to auscultation bilaterally.  No wheezes, rales or rhonchi.  Normal respiratory effort. Psychiatric.  Does not participate in interview or exam.  Occasionally will respond with brief words or head nod.  Does follow some commands.  I have personally reviewed the following:   Today's Data  . CBG stable  Scheduled Meds: .  stroke: mapping our early stages of recovery book   Does not apply Once  . aspirin  81 mg Oral Daily  . atorvastatin  80 mg Oral q1800  . clopidogrel  75 mg Oral Daily  . enoxaparin (LOVENOX) injection  40 mg Subcutaneous Q24H  . feeding supplement (ENSURE ENLIVE)  237 mL Oral TID BM  . FLUoxetine  20  mg Oral Daily  . multivitamin with minerals  1 tablet Oral Daily  . QUEtiapine  100 mg Oral QHS  . QUEtiapine  25 mg Oral q morning - 10a   Continuous Infusions: . sodium chloride 100 mL/hr at 02/11/19 2706    Principal Problem:   CVA (cerebral vascular accident) ([place]) Active Problems:   IV drug abuse ([place])   Dysphagia   Elevated CK   MDD (major depressive disorder), recurrent episode, moderate ([place])   LOS: 4 days   How to contact the [facility] Attending or Consulting provider [place] or covering provider during after hours [place], for this patient?  1. Check the care team in [facility] and look for a) attending/consulting TRH provider listed and b) the [facility] team listed 2. Log into [email or web address] and use [place]'s universal password to access. If you do not have the password, please contact the hospital operator. 3. Locate the [facility] provider you are looking for under [facility] and page to a number that you can be directly reached. 4. If you still have difficulty reaching the provider, please page the [facility] (Director on Call) for the Hospitalists listed on amion for assistance.

## 2019-02-12 LAB — CULTURE, BLOOD (ROUTINE X 2)
Culture: NO GROWTH
Culture: NO GROWTH

## 2019-02-12 LAB — GLUCOSE, CAPILLARY
Glucose-Capillary: 108 mg/dL — ABNORMAL HIGH (ref 70–99)
Glucose-Capillary: 82 mg/dL (ref 70–99)
Glucose-Capillary: 82 mg/dL (ref 70–99)

## 2019-02-12 MED ORDER — NICOTINE 21 MG/24HR TD PT24
21.0000 mg | MEDICATED_PATCH | Freq: Every day | TRANSDERMAL | Status: DC
  Administered 2019-02-12 – 2019-02-16 (×5): 21 mg via TRANSDERMAL
  Filled 2019-02-12 (×7): qty 1

## 2019-02-12 NOTE — Plan of Care (Signed)
  Problem: Education: Goal: Knowledge of General Education information will improve Description: Including pain rating scale, medication(s)/side effects and non-pharmacologic comfort measures Outcome: Progressing   Problem: Education: Goal: Knowledge of disease or condition will improve Outcome: Progressing Goal: Knowledge of secondary prevention will improve Outcome: Progressing Goal: Knowledge of patient specific risk factors addressed and post discharge goals established will improve Outcome: Progressing Goal: Individualized Educational Video(s) Outcome: Progressing   Problem: Coping: Goal: Will verbalize positive feelings about self Outcome: Progressing Goal: Will identify appropriate support needs Outcome: Progressing   Problem: Health Behavior/Discharge Planning: Goal: Ability to manage health-related needs will improve Outcome: Progressing   Problem: Self-Care: Goal: Ability to participate in self-care as condition permits will improve Outcome: Progressing Goal: Ability to communicate needs accurately will improve Outcome: Progressing   Problem: Nutrition: Goal: Risk of aspiration will decrease Outcome: Progressing   Problem: Ischemic Stroke/TIA Tissue Perfusion: Goal: Complications of ischemic stroke/TIA will be minimized Outcome: Progressing

## 2019-02-12 NOTE — Progress Notes (Signed)
PROGRESS NOTE  Jane Watkins QQI:[id number] DOB: 06/24/1995 DOA: 02/07/2019 PCP: Patient, No Pcp Per  Brief History   24 year old woman PMH IV drug use, polysubstance abuse found down unresponsive at home. 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  Seen by neurology, started on aspirin and Plavix.  Stroke work-up complete.  Recommendation for CIR.  Pending acceptance.  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 Complicated by suspected drug overdose. On admission not felt to be a candidate for thrombolytic therapy, neurosurgery reportedly recommended no surgical intervention. CTA head and neck no evidence of significant stenosis. Echocardiogram normal cardiac source of emboli.  TEE unremarkable. --Remained stable, did better with physical therapy today.  Continue aspirin, Plavix and statin. Plavix for 3 weeks only.  Finish Plavix 1/20. --Continue to recommend CIR.  Did better with therapy today.  Dysphagia. Diet per speech therapy 12/28 mechanical soft diet with thin liquids, aspiration precautions. --Tolerating diet.  Depression.  Appreciate psychiatry consultation.  Continue Seroquel at night, add Seroquel during the day, start Prozac. --Recommend substance abuse rehab after neuro rehab --Per psychiatry no imminent risk of self-harm, does not meet criteria for psychiatric admission, IVC rescinded 12/31 in discussion with Mr. Liliane Shi.  PMH IV drug use, urine drug screen positive for cocaine, benzodiazepines and marijuana.  Resolved conditions. Leukocytosis thought to be reactive  Elevated CK, troponin, presumably secondary to being found down   Comment.  Father at bedside today.  Discussed recommendation for CIR and timing, hopefully 1/4.  DVT prophylaxis: enoxaparin Code Status: Full Family Communication: none Disposition Plan: CIR?    Brendia Sacks, MD  [facility] Direct contact: see [email or web address] (further directions  at bottom of note if needed) 7PM-7AM contact night coverage as at bottom of note 02/12/2019, 3:29 PM  LOS: 5 days   Significant Hospital Events   . 12/28 admission for MCA infarct . 12/28 neurology consultation   Consults:  . Neurology   Procedures:  .   Significant Diagnostic Tests:  . Chest x-ray no acute disease . CT head 12/28 area indistinct low-density right basal ganglia with mass-effect on the ventricles. Marland Kitchen MRI brain acute infarct right MCA, thought embolic. No frank hematoma. No findings to suggest septic emboli. . CTA head and neck. No major arterial occlusion or significant stenosis in the head neck. . Echocardiogram LVEF 50-55%. Low normal function. Global right ventricular systolic function normal . 12/31 transesophageal echocardiogram, normal LV function, no PFO, ASD, VSD or thrombus   Micro Data:  . Covid negative   Antimicrobials:  .   Interval History/Subjective  Request nicotine patch.  Did well with physical therapy.  Objective   Vitals:  Vitals:   02/12/19 1100 02/12/19 1445  BP: 120/68   Pulse: (!) 49 (!) 58  Resp: 18   Temp: 98 F (36.7 C)   SpO2: 99% 99%    Exam: Constitutional.  Sitting in chair.  Awake, alert, interactive. Psychiatric.  Appears depressed.  Flat affect.  Speech is fluent and clear. Cardiovascular.  Regular rate and rhythm.  No murmur, rub or gallop.  No lower extremity edema of note. Respiratory.  Clear to auscultation bilaterally.  No wheezes, rales or rhonchi.  Normal respiratory effort.  I have personally reviewed the following:   Today's Data  . No new data  Scheduled Meds: .  stroke: mapping our early stages of recovery book   Does not apply Once  . aspirin  81 mg Oral Daily  .  atorvastatin  80 mg Oral q1800  . clopidogrel  75 mg Oral Daily  . enoxaparin (LOVENOX) injection  40 mg Subcutaneous Q24H  . feeding supplement (ENSURE ENLIVE)  237 mL Oral TID BM  . FLUoxetine  20 mg Oral Daily  . multivitamin with  minerals  1 tablet Oral Daily  . nicotine  21 mg Transdermal Daily  . QUEtiapine  100 mg Oral QHS  . QUEtiapine  25 mg Oral q morning - 10a   Continuous Infusions:   Principal Problem:   CVA (cerebral vascular accident) ([place]) Active Problems:   IV drug abuse ([place])   Dysphagia   Elevated CK   MDD (major depressive disorder), recurrent episode, moderate ([place])   LOS: 5 days   How to contact the [facility] Attending or Consulting provider 7A - 7P or covering provider during after hours [place], for this patient?  1. Check the care team in [facility] and look for a) attending/consulting TRH provider listed and b) the [facility] team listed 2. Log into [email or web address] and use [place]'s universal password to access. If you do not have the password, please contact the hospital operator. 3. Locate the [facility] provider you are looking for under [facility] and page to a number that you can be directly reached. 4. If you still have difficulty reaching the provider, please page the [facility] (Director on Call) for the Hospitalists listed on amion for assistance.
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
## 2019-02-12 NOTE — Progress Notes (Signed)
Pt is on her menstrual period at this time, peri care done

## 2019-02-13 DIAGNOSIS — I639 Cerebral infarction, unspecified: Secondary | ICD-10-CM | POA: Diagnosis present

## 2019-02-13 LAB — CULTURE, BLOOD (ROUTINE X 2)
Culture: NO GROWTH
Culture: NO GROWTH
Special Requests: ADEQUATE
Special Requests: ADEQUATE

## 2019-02-13 LAB — BASIC METABOLIC PANEL
Anion gap: 10 (ref 5–15)
BUN: 13 mg/dL (ref 6–20)
CO2: 20 mmol/L — ABNORMAL LOW (ref 22–32)
Calcium: 8.8 mg/dL — ABNORMAL LOW (ref 8.9–10.3)
Chloride: 112 mmol/L — ABNORMAL HIGH (ref 98–111)
Creatinine, Ser: 0.58 mg/dL (ref 0.44–1.00)
GFR calc Af Amer: >60 mL/min (ref >60)
GFR calc non Af Amer: >60 mL/min (ref >60)
Glucose, Bld: 107 mg/dL — ABNORMAL HIGH (ref 70–99)
Potassium: 3.7 mmol/L (ref 3.5–5.1)
Sodium: 142 mmol/L (ref 135–145)

## 2019-02-13 LAB — CK: Total CK: 182 U/L (ref 38–234)
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
## 2019-02-13 NOTE — Progress Notes (Addendum)
PROGRESS NOTE  Jane Watkins QVZ:[id number] DOB: 1996-01-07 DOA: 02/07/2019 PCP: Patient, No Pcp Per  Brief History   24 year old woman PMH IV drug use, polysubstance abuse found down unresponsive at home. 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  Seen by neurology, started on aspirin and Plavix.  Stroke work-up complete.  Recommendation for CIR.  Pending acceptance.  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 Complicated by suspected drug overdose. On admission not felt to be a candidate for thrombolytic therapy, neurosurgery reportedly recommended no surgical intervention. CTA head and neck no evidence of significant stenosis. Echocardiogram normal cardiac source of emboli.  TEE unremarkable. --Remained stable, did better with physical therapy today.  Continue aspirin, Plavix and statin. Plavix for 3 weeks only.  Finish Plavix 1/20. --Continue to recommend CIR.  Did better with therapy today. - Awaiting CIR approval and bed availability   Dysphagia. Diet per speech therapy 12/28 mechanical soft diet with thin liquids, aspiration precautions. --Tolerating diet.  Depression.  Appreciate psychiatry consultation.  Continue Seroquel at night, add Seroquel during the day, start Prozac. --Recommend substance abuse rehab after neuro rehab --Per psychiatry no imminent risk of self-harm, does not meet criteria for psychiatric admission, IVC rescinded 12/31 in discussion with Mr. Yisroel Ramming.  PMH IV drug use, urine drug screen positive for cocaine, benzodiazepines and marijuana. - Provided counseling   Resolved conditions. Leukocytosis thought to be reactive  Elevated CK, troponin, presumably secondary to being found down   Comment.  Father at bedside today.  Discussed recommendation for CIR and timing, hopefully 1/4. - per nursing staffs, pt has sitter since her boyfriend, who is not  the designated visitor, visited her recently.  - Sitter  should continue to monitor unauthorized visitor   DVT prophylaxis: enoxaparin Code Status: Full Family Communication: none Disposition Plan: CIR; pending approval    Thornell Mule, MD  [facility] Direct contact: see [email or web address] (further directions at bottom of note if needed) 7PM-7AM contact night coverage as at bottom of note 02/13/2019, 1:26 PM  LOS: 6 days   Significant Hospital Events   . 12/28 admission for MCA infarct . 12/28 neurology consultation   Consults:  . Neurology   Procedures:  .   Significant Diagnostic Tests:  . Chest x-ray no acute disease . CT head 12/28 area indistinct low-density right basal ganglia with mass-effect on the ventricles. Marland Kitchen MRI brain acute infarct right MCA, thought embolic. No frank hematoma. No findings to suggest septic emboli. . CTA head and neck. No major arterial occlusion or significant stenosis in the head neck. . Echocardiogram LVEF 50-55%. Low normal function. Global right ventricular systolic function normal . 12/31 transesophageal echocardiogram, normal LV function, no PFO, ASD, VSD or thrombus   Micro Data:  . Covid negative   Antimicrobials:  .   Interval History/Subjective  Lying on bed.  Denies any suicidal thoughts or ideas or homicidal thoughts or ideas.  Also denies any hallucination.  Wondered when she might be living.  Objective   Vitals:  Vitals:   02/12/19 1615 02/12/19 2157  BP: (!) 99/58 126/79  Pulse: (!) 52 65  Resp: 20 16  Temp: 98.8 F (37.1 C)   SpO2: 100% 100%    Exam: Constitutional.  Lying on bed.  Awake, alert, interactive. Psychiatric.  Appears depressed.  Flat affect.  Speech is fluent and clear.  Denies suicidal thoughts or ideas and homicidal thoughts or ideas.  Also denies hallucination Cardiovascular.  Regular  rate and rhythm.  No murmur, rub or gallop.  No lower extremity edema of note. Respiratory.  Clear to auscultation bilaterally.  No wheezes, rales or rhonchi.  Normal  respiratory effort. Abdomen: Positive bowel sound, nontender nondistended Neuro: Left upper extremity weakness 0-1 out of 4 left lower extremity weakness. I have personally reviewed the following:   Today's Data  . No new data  Scheduled Meds: .  stroke: mapping our early stages of recovery book   Does not apply Once  . aspirin  81 mg Oral Daily  . atorvastatin  80 mg Oral q1800  . clopidogrel  75 mg Oral Daily  . enoxaparin (LOVENOX) injection  40 mg Subcutaneous Q24H  . feeding supplement (ENSURE ENLIVE)  237 mL Oral TID BM  . FLUoxetine  20 mg Oral Daily  . multivitamin with minerals  1 tablet Oral Daily  . nicotine  21 mg Transdermal Daily  . QUEtiapine  100 mg Oral QHS  . QUEtiapine  25 mg Oral q morning - 10a   Continuous Infusions:   Principal Problem:   CVA (cerebral vascular accident) (HCC) Active Problems:   IV drug abuse (HCC)   Dysphagia   Elevated CK   MDD (major depressive disorder), recurrent episode, moderate (HCC)   Ischemic stroke diagnosed during current admission (HCC)   LOS: 6 days   How to contact the [facility] Attending or Consulting provider 7A - 7P or covering provider during after hours 7P -7A, for this patient?  1. Check the care team in [facility] and look for a) attending/consulting [facility] provider listed and b) the [facility] team listed 2. Log into [email or web address] and use [place]'s universal password to access. If you do not have the password, please contact the hospital operator. 3. Locate the [facility] provider you are looking for under [facility] and page to a number that you can be directly reached. 4. If you still have difficulty reaching the provider, please page the [facility] (Director on Call) for the Hospitalists listed on amion for assistance.

## 2019-02-13 NOTE — Plan of Care (Signed)
  Problem: Education: Goal: Knowledge of General Education information will improve Description: Including pain rating scale, medication(s)/side effects and non-pharmacologic comfort measures Outcome: Progressing   Problem: Education: Goal: Knowledge of disease or condition will improve Outcome: Progressing Goal: Knowledge of secondary prevention will improve Outcome: Progressing Goal: Knowledge of patient specific risk factors addressed and post discharge goals established will improve Outcome: Progressing Goal: Individualized Educational Video(s) Outcome: Progressing   Problem: Coping: Goal: Will verbalize positive feelings about self Outcome: Progressing Goal: Will identify appropriate support needs Outcome: Progressing   Problem: Health Behavior/Discharge Planning: Goal: Ability to manage health-related needs will improve Outcome: Progressing   Problem: Self-Care: Goal: Ability to participate in self-care as condition permits will improve Outcome: Progressing Goal: Ability to communicate needs accurately will improve Outcome: Progressing   Problem: Nutrition: Goal: Risk of aspiration will decrease Outcome: Progressing   Problem: Ischemic Stroke/TIA Tissue Perfusion: Goal: Complications of ischemic stroke/TIA will be minimized Outcome: Progressing   

## 2019-02-14 DIAGNOSIS — I639 Cerebral infarction, unspecified: Secondary | ICD-10-CM

## 2019-02-14 LAB — CREATININE, SERUM
Creatinine, Ser: 0.55 mg/dL (ref 0.44–1.00)
GFR calc Af Amer: >60 mL/min (ref >60)
GFR calc non Af Amer: >60 mL/min (ref >60)

## 2019-02-14 NOTE — Progress Notes (Signed)
Inpatient Rehab Admissions:  Inpatient Rehab Consult received.  I spoke with the patient over the phone for rehabilitation assessment and to discuss goals and expectations of an inpatient rehab admission.  She is hopeful for CIR once bed available and pending insurance authorization.  She states her plan is to return home with BF, per chart review family would also possibly be an option.  Since her boyfriend has been asked not to visit, she states her father, Kathlene November Renae Fickle in the chart) has been visiting.  I attempted to reach him but was unable and she states he is working [employer].  Will attempt to reach him again in the morning to determine support/dispo.  Will need firm dispo, and insurance authorization before possible admission pending both and bed availability.   Signed: Estill Dooms, PT, DPT Admissions Coordinator [phone] 02/14/19  4:56 PM
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
## 2019-02-14 NOTE — Progress Notes (Signed)
PROGRESS NOTE  Jane Watkins GDJ:[id number] DOB: 07-Jun-1995 DOA: 02/07/2019 PCP: Patient, No Pcp Per  Brief History   24 year old woman PMH IV drug use, polysubstance abuse found down unresponsive at home. 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  Seen by neurology, started on aspirin and Plavix.  Stroke work-up complete.  Recommendation for CIR.  Pending acceptance.  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 Complicated by suspected drug overdose. On admission not felt to be a candidate for thrombolytic therapy, neurosurgery reportedly recommended no surgical intervention. CTA head and neck no evidence of significant stenosis. Echocardiogram normal cardiac source of emboli.  TEE unremarkable. --Remained stable, did better with physical therapy today.  Continue aspirin, Plavix and statin. Plavix for 3 weeks only.  Finish Plavix 1/20. --Continue to recommend CIR.  Did better with therapy today. - Awaiting CIR approval and bed availability   Dysphagia. Diet per speech therapy 12/28 mechanical soft diet with thin liquids, aspiration precautions. --Tolerating diet.  Depression.  Appreciate psychiatry consultation.  Continue Seroquel at night, add Seroquel during the day, start Prozac. --Recommend substance abuse rehab after neuro rehab --Per psychiatry no imminent risk of self-harm, does not meet criteria for psychiatric admission, IVC rescinded 12/31 in discussion with Mr. Yisroel Ramming.  PMH IV drug use, urine drug screen positive for cocaine, benzodiazepines and marijuana. - Provided counseling   Resolved conditions. Leukocytosis thought to be reactive  Elevated CK, troponin, presumably secondary to being found down   Comment.  Discussed recommendation for CIR and timing, hopefully 1/4.  - per nursing staffs, pt has sitter since her boyfriend, who is not  the designated visitor, visited her recently.  - Sitter should continue to monitor  unauthorized visitor   Disposition: Pending CIR approval and bed availability   DVT prophylaxis: enoxaparin Code Status: Full Family Communication: none Disposition Plan: CIR; pending approval    Thornell Mule, MD  [facility] Direct contact: see [email or web address] (further directions at bottom of note if needed) 7PM-7AM contact night coverage as at bottom of note 02/14/2019, 11:40 AM  LOS: 7 days   Significant Hospital Events   . 12/28 admission for MCA infarct . 12/28 neurology consultation   Consults:  . Neurology   Procedures:  .   Significant Diagnostic Tests:  . Chest x-ray no acute disease . CT head 12/28 area indistinct low-density right basal ganglia with mass-effect on the ventricles. Marland Kitchen MRI brain acute infarct right MCA, thought embolic. No frank hematoma. No findings to suggest septic emboli. . CTA head and neck. No major arterial occlusion or significant stenosis in the head neck. . Echocardiogram LVEF 50-55%. Low normal function. Global right ventricular systolic function normal . 12/31 transesophageal echocardiogram, normal LV function, no PFO, ASD, VSD or thrombus   Micro Data:  . Covid negative   Antimicrobials:  .   Interval History/Subjective  Lying on bed.  Denies any suicidal thoughts or ideas or homicidal thoughts or ideas.  Also denies any hallucination.  Asked if she is leaving today. Awaiting placement/ CIR.   Objective   Vitals:  Vitals:   02/14/19 0339 02/14/19 0908  BP: 97/69 107/60  Pulse: 65 67  Resp: 17 18  Temp: 98.2 F (36.8 C) 98.5 F (36.9 C)  SpO2: 100% 100%    Exam: Constitutional.  Lying on bed.  Awake, alert, interactive. Psychiatric.  Appears depressed.  Flat affect.  Speech is fluent and clear.  Denies suicidal thoughts or ideas and homicidal  thoughts or ideas.  Also denies hallucination Cardiovascular.  Regular rate and rhythm.  No murmur, rub or gallop.  No lower extremity edema of note. Respiratory.  Clear to  auscultation bilaterally.  No wheezes, rales or rhonchi.  Normal respiratory effort. Abdomen: Positive bowel sound, nontender nondistended Neuro: Left upper extremity weakness 0-1 out of 4 left lower extremity weakness. I have personally reviewed the following:   Today's Data  . No new data  Scheduled Meds: .  stroke: mapping our early stages of recovery book   Does not apply Once  . aspirin  81 mg Oral Daily  . atorvastatin  80 mg Oral q1800  . clopidogrel  75 mg Oral Daily  . enoxaparin (LOVENOX) injection  40 mg Subcutaneous Q24H  . feeding supplement (ENSURE ENLIVE)  237 mL Oral TID BM  . FLUoxetine  20 mg Oral Daily  . multivitamin with minerals  1 tablet Oral Daily  . nicotine  21 mg Transdermal Daily  . QUEtiapine  100 mg Oral QHS  . QUEtiapine  25 mg Oral q morning - 10a   Continuous Infusions:   Principal Problem:   CVA (cerebral vascular accident) (HCC) Active Problems:   IV drug abuse (HCC)   Dysphagia   Elevated CK   MDD (major depressive disorder), recurrent episode, moderate (HCC)   Ischemic stroke diagnosed during current admission (HCC)   LOS: 7 days   How to contact the [facility] Attending or Consulting provider 7A - 7P or covering provider during after hours 7P -7A, for this patient?  1. Check the care team in [facility] and look for a) attending/consulting TRH provider listed and b) the [facility] team listed 2. Log into [email or web address] and use [place]'s universal password to access. If you do not have the password, please contact the hospital operator. 3. Locate the [facility] provider you are looking for under [facility] and page to a number that you can be directly reached. 4. If you still have difficulty reaching the provider, please page the [facility] (Director on Call) for the Hospitalists listed on amion for assistance.

## 2019-02-14 NOTE — Plan of Care (Signed)
  Problem: Education: Goal: Knowledge of General Education information will improve Description: Including pain rating scale, medication(s)/side effects and non-pharmacologic comfort measures Outcome: Progressing   Problem: Education: Goal: Knowledge of disease or condition will improve Outcome: Progressing Goal: Knowledge of secondary prevention will improve Outcome: Progressing Goal: Knowledge of patient specific risk factors addressed and post discharge goals established will improve Outcome: Progressing Goal: Individualized Educational Video(s) Outcome: Progressing   Problem: Coping: Goal: Will verbalize positive feelings about self Outcome: Progressing Goal: Will identify appropriate support needs Outcome: Progressing   Problem: Health Behavior/Discharge Planning: Goal: Ability to manage health-related needs will improve Outcome: Progressing   Problem: Self-Care: Goal: Ability to participate in self-care as condition permits will improve Outcome: Progressing Goal: Ability to communicate needs accurately will improve Outcome: Progressing   Problem: Nutrition: Goal: Risk of aspiration will decrease Outcome: Progressing   Problem: Ischemic Stroke/TIA Tissue Perfusion: Goal: Complications of ischemic stroke/TIA will be minimized Outcome: Progressing   

## 2019-02-14 NOTE — TOC Progression Note (Signed)
Transition of Care [facility]) - Progression Note    Patient Details  Name: Jane Watkins MRN: [id number] Date of Birth: 1995-04-08  [facility] CM/SW Contact  Trenton Founds, RN Phone Number: 02/14/2019, 1:44 PM  Clinical Narrative:     Attempted call to [facility] admissions coordinator at [facility], voice mail left awaiting call back.     Expected Discharge Plan: IP Rehab Facility Barriers to Discharge: Continued Medical Work up  Expected Discharge Plan and Services Expected Discharge Plan: IP Rehab Facility   Discharge Planning Services: CM Consult Post Acute Care Choice: IP Rehab                                         Social Determinants of Health (SDOH) Interventions    Readmission Risk Interventions No flowsheet data found.

## 2019-02-15 MED ORDER — ENSURE ENLIVE PO LIQD
237.0000 mL | Freq: Two times a day (BID) | ORAL | Status: DC
  Administered 2019-02-16: 237 mL via ORAL

## 2019-02-15 MED ORDER — PANTOPRAZOLE SODIUM 20 MG PO TBEC
20.0000 mg | DELAYED_RELEASE_TABLET | Freq: Every day | ORAL | Status: DC
  Administered 2019-02-15 – 2019-02-16 (×2): 20 mg via ORAL
  Filled 2019-02-15 (×2): qty 1

## 2019-02-15 NOTE — H&P (Signed)
Physical Medicine and Rehabilitation Admission H&P    Chief Complaint  Patient presents with  . Drug Overdose  : HPI: Jane Watkins is a 24 year old left-handed female with history of IV drug use, polysubstance/tobacco abuse, anxiety.  History taken from chart review, patient, and boyfriend. Patient on no prescription medications at time of admission.  Patient lives with her boyfriend.  She reportedly works at [employer].  Independent prior to admission.  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  She was discharged home with family ambulating extended distances with a rolling walker.  Presented 02/07/2019 after being found unresponsive. EMS was contacted the patient received Narcan with some improvement in mental status.  Admission labs with WBC 20,700, potassium 5.2, BUN 25, creatinine 0.67, urine drug screen positive cocaine, marijuana and benzodiazepines, alcohol level less than 10, SARS coronavirus negative, blood cultures no growth to date.  Cranial CT scan showed areas of indistinct low-density in the right basal ganglia with mass-effect on the right lateral ventricle.  Patient did not receive TPA.  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  CTA of head and neck showed no major arterial occlusion or significant stenosis.  Echocardiogram with EF of 60%. Neurology follow-up currently maintained on aspirin and Plavix for CVA prophylaxis x3 weeks then aspirin alone with Plavix to be completed 03/02/2019.  Subcutaneous Lovenox for DVT prophylaxis.  Psychiatry consulted for depression and maintained on Seroquel as well as Prozaac.  Tolerating a regular diet.  Therapy evaluations completed and patient was admitted for a comprehensive rehab program. Please see  preadmission assessment from earlier today as well.   Review of Systems  Constitutional: Negative for chills and fever.  HENT: Negative for hearing loss.   Eyes: Negative for blurred vision and double vision.  Respiratory: Negative for cough and shortness of breath.   Cardiovascular: Negative for chest pain, palpitations and leg swelling.  Gastrointestinal: Positive for constipation. Negative for heartburn and nausea.       GERD  Genitourinary: Negative for dysuria, flank pain and hematuria.  Musculoskeletal: Positive for joint pain and myalgias.  Skin: Negative for rash.  Neurological: Positive for focal weakness and headaches. Negative for sensory change.  Psychiatric/Behavioral: Positive for depression. The patient has insomnia.        Anxiety   Past Medical History:  Diagnosis Date  . AKI (acute kidney injury) (HCC) 2018   "from overdose"  . Anxiety   . Daily headache   . Depression   . Drug overdose 04/2016   [name] 05/01/2016  . GERD (gastroesophageal reflux disease)    "when I was younger; gone now" (09/21/2017)  . Migraine    "q couple weeks" (09/21/2017)  . Overdose    Past Surgical History:  Procedure Laterality Date  . APPENDECTOMY  04/2013  . I & D EXTREMITY Left 09/20/2017   Procedure: IRRIGATION AND DEBRIDEMENT LEFT ARM;  Surgeon: Bradly Bienenstock, MD;  Location: [facility] OR;  Service: Orthopedics;  Laterality: Left;  . TEE WITHOUT CARDIOVERSION N/A 02/10/2019   Procedure: TRANSESOPHAGEAL ECHOCARDIOGRAM (TEE);  Surgeon: Debbe Odea, MD;  Location: [facility] ORS;  Service: Cardiovascular;  Laterality: N/A;  Polysubstance abuser  . TONGUE SURGERY  ~ 2007   "related to lisp"  . TRACHEOSTOMY  04/2016   [name] 05/01/2016   Family History  Problem Relation Age of  Onset  . Anemia Father    Social History:  reports that she has been smoking cigarettes. She has a 5.00 pack-year smoking history. She has never used smokeless tobacco. She reports current drug use. Drugs: IV,  Heroin, and Cocaine. She reports that she does not drink alcohol. Allergies:  Allergies  Allergen Reactions  . Other Rash    Tide detergent   . Sulfa Antibiotics Rash   Medications Prior to Admission  Medication Sig Dispense Refill  .  stroke: mapping our early stages of recovery book MISC 1 each by Does not apply route once for 1 dose.    Marland Kitchen aspirin 81 MG chewable tablet Chew 1 tablet (81 mg total) by mouth daily.    Marland Kitchen atorvastatin (LIPITOR) 80 MG tablet Take 1 tablet (80 mg total) by mouth daily at 6 PM.    . clopidogrel (PLAVIX) 75 MG tablet Take 1 tablet (75 mg total) by mouth daily.    . feeding supplement, ENSURE ENLIVE, (ENSURE ENLIVE) LIQD Take 237 mLs by mouth 2 (two) times daily between meals. 237 mL 12  . FLUoxetine (PROZAC) 20 MG capsule Take 1 capsule (20 mg total) by mouth daily.  3  . hydrOXYzine (ATARAX/VISTARIL) 25 MG tablet Take 1 tablet (25 mg total) by mouth 3 (three) times daily as needed for anxiety. 30 tablet 0  . Multiple Vitamin (MULTIVITAMIN WITH MINERALS) TABS tablet Take 1 tablet by mouth daily.    . nicotine (NICODERM CQ - DOSED IN MG/24 HOURS) 21 mg/24hr patch Place 1 patch (21 mg total) onto the skin daily. 28 patch 0  . pantoprazole (PROTONIX) 20 MG tablet Take 1 tablet (20 mg total) by mouth daily.    . QUEtiapine (SEROQUEL) 25 MG tablet Take 1 tablet (25 mg total) by mouth every morning.      Drug Regimen Review Drug regimen was reviewed and remains appropriate with no significant issues identified  Home: Home Living Family/patient expects to be discharged to:: Private residence Living Arrangements: (boyfriend) Type of Home: Apartment Home Access: Level entry Home Layout: One level Bathroom Shower/Tub: Chiropodist: Standard Home Equipment: None Additional Comments: boyfriend present, provided home set up information, as pt unable to verbalize   Functional History: Prior Function Level of Independence: Independent Comments:  Per boyfriend/chart, pt indep and working  Functional Status:  Mobility: Bed Mobility Overal bed mobility: Needs Assistance Bed Mobility: Supine to Sit Supine to sit: Supervision, HOB elevated Sit to supine: Min assist General bed mobility comments: Pt. initiated hooking her LLE under her right to slide over tot he EOB. Transfers Overall transfer level: Needs assistance Equipment used: Hemi-walker Transfers: Sit to/from Stand Sit to Stand: Min guard, Min assist Stand pivot transfers: Min guard, Min assist([place] to the right to BS[place]mond, MinA to the Left to bed.) General transfer comment: vc's for positioning to stand and also prior to sitting; posterior lean noted standing from bed initially (pt pushing B LE's against bed to stabilize) Ambulation/Gait Ambulation/Gait assistance: Min assist Gait Distance (Feet): 130 Feet Assistive device: Hemi-walker Gait Pattern/deviations: Step-to pattern, Decreased step length - right, Decreased stance time - left General Gait Details: slow gait with narrow BOS with decreased step length and height,  While no outright LOB with foward gait, she requires +1 assist at all times for balance and safety. Gait velocity: decreased    ADL: ADL Overall ADL's : Needs assistance/impaired Eating/Feeding: Set up, Independent Eating/Feeding Details (indicate cue type and reason): Limited endurance for self-feeding  applesauce  using her nondominant RUE. Pt. is able to perform hand to mouth patterns with the utensil, and scooping complete set-up is provided, and stabilized in front of her. Grooming: Set up, Min guard, Minimal assistance Grooming Details (indicate cue type and reason): Pt set up with grooming supplies at tray table, Min A to stand EOB, CGA to maintain standing balance with therapist providing proprioceptive input to LUE joints while pt performed standing grooming tasks with non-dom R hand. Min A to perform bilat tasks such as opening toothpaste,  applying to toothbrush, and applying deodorant to R underarm. Cues for compensatory strategies manipulate objects Upper Body Bathing: Maximal assistance Lower Body Bathing: Total assistance Upper Body Dressing : Total assistance Lower Body Dressing: Total assistance Toilet Transfer: Minimal assistance, Min guard Toilet Transfer Details (indicate cue type and reason): Min guard t/fs to the right to [place]; MinA wih t/fs back to the bed with increased time to advance the Left LE. Toileting- Clothing Manipulation and Hygiene: Minimal assistance Functional mobility during ADLs: Min guard, Minimal assistance General ADL Comments: Pt demo's improvement in deficits, requiring at least min-mod assist for ADL tasks  Cognition: Cognition Overall Cognitive Status: Within Functional Limits for tasks assessed Orientation Level: Oriented X4 Cognition Arousal/Alertness: Awake/alert Behavior During Therapy: Flat affect Overall Cognitive Status: Within Functional Limits for tasks assessed General Comments: responds when talked to but does not initiate conversation Difficult to assess due to: Impaired communication  Physical Exam: Blood pressure 106/64, pulse 71, temperature 98 F (36.7 C), temperature source Oral, resp. rate 18, height 5\' 5"  (1.651 m), weight 59 kg, last menstrual period 01/31/2019, SpO2 99 %. Physical Exam  Vitals reviewed. Constitutional: She appears well-developed.  Thin  HENT:  Head: Normocephalic and atraumatic.  Eyes: EOM are normal. Right eye exhibits no discharge. Left eye exhibits no discharge.  Neck: No tracheal deviation present. No thyromegaly present.  Respiratory: Effort normal. No respiratory distress.  GI: Soft. She exhibits no distension.  Musculoskeletal:     Comments: No edema or tenderness in extremities  Neurological: She is alert.  Follows commands.   Oriented x3.   Motor: RUE/RLE: 5/5 proximal to distal LUE: 0/5 proximal to distal LLE: HF, KE 2/5,  ADF 0/5 Emerging tone elbow flexors LUE  Skin: Skin is warm and dry.  Psychiatric: She has a normal mood and affect. Her behavior is normal.    No results found for this or any previous visit (from the past 48 hour(s)). No results found.     Medical Problem List and Plan: 1.  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  Antithrombotics: -DVT/anticoagulation: Subcutaneous Lovenox  -antiplatelet therapy: Aspirin 81 mg daily and Plavix 75 mg daily until 03/02/2019 and then aspirin alone 3. Pain Management: Tylenol as needed 4. Mood: Seroquel 25 mg every morning and 100 mg nightly, Prozac 25 mg daily.     Emotional support  -antipsychotic agents: N/A 5. Neuropsych: This patient is capable of making decisions on her own behalf. 6. Skin/Wound Care: Routine skin checks 7. Fluids/Electrolytes/Nutrition: Routine in and outs. CMP ordered for tomorrow AM. 8.  Polysubstance abuse as well as tobacco abuse.  Provide counseling 9.  Hyperlipidemia.  Continue Lipitor  Lavon Paganini Angiulli, PA-C 02/16/2019  I have personally performed a face to face diagnostic evaluation, including, but not limited  to relevant history and physical exam findings, of this patient and developed relevant assessment and plan.  Additionally, I have reviewed and concur with the physician assistant's documentation above.  Maryla Morrow, MD, ABPMR

## 2019-02-15 NOTE — Plan of Care (Signed)
  Problem: Education: Goal: Knowledge of General Education information will improve Description: Including pain rating scale, medication(s)/side effects and non-pharmacologic comfort measures Outcome: Progressing   Problem: Education: Goal: Knowledge of disease or condition will improve Outcome: Progressing Goal: Knowledge of secondary prevention will improve Outcome: Progressing Goal: Knowledge of patient specific risk factors addressed and post discharge goals established will improve Outcome: Progressing Goal: Individualized Educational Video(s) Outcome: Progressing   Problem: Coping: Goal: Will verbalize positive feelings about self Outcome: Progressing Goal: Will identify appropriate support needs Outcome: Progressing   Problem: Health Behavior/Discharge Planning: Goal: Ability to manage health-related needs will improve Outcome: Progressing   Problem: Self-Care: Goal: Ability to participate in self-care as condition permits will improve Outcome: Progressing Goal: Ability to communicate needs accurately will improve Outcome: Progressing   Problem: Nutrition: Goal: Risk of aspiration will decrease Outcome: Progressing   Problem: Ischemic Stroke/TIA Tissue Perfusion: Goal: Complications of ischemic stroke/TIA will be minimized Outcome: Progressing   

## 2019-02-15 NOTE — Progress Notes (Signed)
Nutrition Follow-up  DOCUMENTATION CODES:   Not applicable  INTERVENTION:  Will decrease to Ensure Enlive po BID, each supplement provides 350 kcal and 20 grams of protein.  Continue daily MVI.  Encouraged ongoing adequate intake of calories and protein at meals.  NUTRITION DIAGNOSIS:   Inadequate oral intake related to decreased appetite as evidenced by meal completion < 50%.  Resolving - PO intake improving.  GOAL:   Patient will meet greater than or equal to 90% of their needs  Progressing.  MONITOR:   PO intake, Supplement acceptance, Labs, Weight trends, I & O's  REASON FOR ASSESSMENT:   Consult Assessment of nutrition requirement/status  ASSESSMENT:   24 year old female with PMHx of GERD, migraines, anxiety, depression admitted with suspected drug overdose, acute MCA infarct.  Diet was advanced to regular on 1/1. Patient reports her appetite and intake are improving. She is now eating mainly 80-100% of meals. She is drinking 1-2 bottles of Ensure daily and enjoys these. May transfer to [facility] pending insurance authorization and bed availability.  Medications reviewed and include: MVI daily, nicotine patch, Seroquel.  Labs reviewed: CBG 95-132, Chloride 112, CO2 20.  Diet Order:   Diet Order            Diet regular Room service appropriate? Yes; Fluid consistency: Thin  Diet effective now             EDUCATION NEEDS:   No education needs have been identified at this time  Skin:  Skin Assessment: Reviewed RN Assessment  Last BM:  02/13/2019 medium type 4  Height:   Ht Readings from Last 1 Encounters:  02/08/19 5\' 5"  (1.651 m)   Weight:   Wt Readings from Last 1 Encounters:  02/07/19 59 kg   Ideal Body Weight:  56.8 kg  BMI:  Body mass index is 21.64 kg/m.  Estimated Nutritional Needs:   Kcal:  02/09/19  Protein:  70-80 grams  Fluid:  1.6-1.8 L/day  [phone], MS, RD, LDN Office: [phone] Pager: [phone] After  Hours/Weekend Pager: [phone]
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
## 2019-02-15 NOTE — Progress Notes (Signed)
PROGRESS NOTE  Jane Watkins BMW:[id number] DOB: April 21, 1995 DOA: 02/07/2019 PCP: Patient, No Pcp Per  Brief History   24 year old woman PMH IV drug use, polysubstance abuse found down unresponsive at home. 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  Seen by neurology, started on aspirin and Plavix.  Stroke work-up complete.  Recommendation for CIR.  Pending acceptance.  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 Complicated by suspected drug overdose. On admission not felt to be a candidate for thrombolytic therapy, neurosurgery reportedly recommended no surgical intervention. CTA head and neck no evidence of significant stenosis. Echocardiogram normal cardiac source of emboli.  TEE unremarkable. --Remained stable, did better with physical therapy today.  Continue aspirin, Plavix and statin. Plavix for 3 weeks only.  Finish Plavix 1/20. --Continue to recommend CIR.  Did better with therapy today. - Awaiting CIR approval and bed availability   Dysphagia. Diet per speech therapy 12/28 mechanical soft diet with thin liquids, aspiration precautions. --Tolerating diet.  Depression.  Appreciate psychiatry consultation.  Continue Seroquel at night, add Seroquel during the day, start Prozac. --Recommend substance abuse rehab after neuro rehab --Per psychiatry no imminent risk of self-harm, does not meet criteria for psychiatric admission, IVC rescinded 12/31 in discussion with Mr. Yisroel Ramming.  PMH IV drug use, urine drug screen positive for cocaine, benzodiazepines and marijuana. - Provided counseling   Resolved conditions. Leukocytosis thought to be reactive  Elevated CK, troponin, presumably secondary to being found down   Comment.  Discussed recommendation for CIR and timing, hopefully 1/4.  - per nursing staffs, pt has sitter since her boyfriend, who is not  the designated visitor, visited her recently.  - Sitter should continue to monitor  unauthorized visitor   Disposition: Pending CIR approval and bed availability   DVT prophylaxis: enoxaparin Code Status: Full Family Communication: none Disposition Plan: CIR; pending approval    Thornell Mule, MD  [facility] Direct contact: see [email or web address] (further directions at bottom of note if needed) 7PM-7AM contact night coverage as at bottom of note 02/15/2019, 12:43 PM  LOS: 8 days   Significant Hospital Events   . 12/28 admission for MCA infarct . 12/28 neurology consultation   Consults:  . Neurology   Procedures:  .   Significant Diagnostic Tests:  . Chest x-ray no acute disease . CT head 12/28 area indistinct low-density right basal ganglia with mass-effect on the ventricles. Marland Kitchen MRI brain acute infarct right MCA, thought embolic. No frank hematoma. No findings to suggest septic emboli. . CTA head and neck. No major arterial occlusion or significant stenosis in the head neck. . Echocardiogram LVEF 50-55%. Low normal function. Global right ventricular systolic function normal . 12/31 transesophageal echocardiogram, normal LV function, no PFO, ASD, VSD or thrombus   Micro Data:  . Covid negative   Antimicrobials:  .   Interval History/Subjective  Lying on bed.  Awaiting CIR better with ability pending approval.  No acute issues overnight and this morning.  Objective   Vitals:  Vitals:   02/14/19 2339 02/15/19 0741  BP: 96/62 108/66  Pulse: 71 74  Resp: 16 18  Temp: 98.3 F (36.8 C) 98.1 F (36.7 C)  SpO2: 100% 100%    Exam: Constitutional.  Lying on bed.  Awake, alert, interactive. Psychiatric.  Appears depressed.  Flat affect.  Speech is fluent and clear.  Denies suicidal thoughts or ideas and homicidal thoughts or ideas.  Also denies hallucination Cardiovascular.  Regular rate and  rhythm.  No murmur, rub or gallop.  No lower extremity edema of note. Respiratory.  Clear to auscultation bilaterally.  No wheezes, rales or rhonchi.  Normal  respiratory effort. Abdomen: Positive bowel sound, nontender nondistended Neuro: Left upper extremity weakness 0-1 out of 4 left lower extremity weakness. I have personally reviewed the following:   Today's Data  . No new data  Scheduled Meds: .  stroke: mapping our early stages of recovery book   Does not apply Once  . aspirin  81 mg Oral Daily  . atorvastatin  80 mg Oral q1800  . clopidogrel  75 mg Oral Daily  . enoxaparin (LOVENOX) injection  40 mg Subcutaneous Q24H  . feeding supplement (ENSURE ENLIVE)  237 mL Oral BID BM  . FLUoxetine  20 mg Oral Daily  . multivitamin with minerals  1 tablet Oral Daily  . nicotine  21 mg Transdermal Daily  . QUEtiapine  100 mg Oral QHS  . QUEtiapine  25 mg Oral q morning - 10a   Continuous Infusions:   Principal Problem:   CVA (cerebral vascular accident) (HCC) Active Problems:   IV drug abuse (HCC)   Dysphagia   Elevated CK   MDD (major depressive disorder), recurrent episode, moderate (HCC)   Ischemic stroke diagnosed during current admission (HCC)   LOS: 8 days   How to contact the [facility] Attending or Consulting provider 7A - 7P or covering provider during after hours 7P -7A, for this patient?  1. Check the care team in [facility] and look for a) attending/consulting [facility] provider listed and b) the [facility] team listed 2. Log into [email or web address] and use [facility] universal password to access. If you do not have the password, please contact the hospital operator. 3. Locate the [facility] provider you are looking for under [facility] and page to a number that you can be directly reached. 4. If you still have difficulty reaching the provider, please page the [facility] (Director on Call) for the Hospitalists listed on amion for assistance.

## 2019-02-15 NOTE — Progress Notes (Signed)
Inpatient Rehab Admissions Coordinator:   Attempted to reach father again, but was unable to and his voicemail is not set up.  I did speak extensively with pt's mother, Scarlett Presto, and pt's boyfriend, [name], regarding possible transition to rehab, pending insurance authorization, and dispo plan from CIR.  I do not have any bed available for this pt today, but will follow for possible admission tomorrow pending ins auth and bed availability.   Estill Dooms, PT, DPT Admissions Coordinator [phone] 02/15/19  11:52 AM
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
## 2019-02-16 ENCOUNTER — Encounter (HOSPITAL_COMMUNITY): Payer: Self-pay | Admitting: Physical Medicine & Rehabilitation

## 2019-02-16 ENCOUNTER — Other Ambulatory Visit: Payer: Self-pay

## 2019-02-16 ENCOUNTER — Inpatient Hospital Stay (HOSPITAL_COMMUNITY)
Admission: RE | Admit: 2019-02-16 | Discharge: 2019-02-28 | DRG: 057 | Disposition: A | Discharge status: Home / Self Care | Payer: 59 | Source: Other Acute Inpatient Hospital | Attending: Physical Medicine & Rehabilitation | Admitting: Physical Medicine & Rehabilitation

## 2019-02-16 DIAGNOSIS — R45851 Suicidal ideations: Secondary | ICD-10-CM | POA: Diagnosis present

## 2019-02-16 DIAGNOSIS — I63511 Cerebral infarction due to unspecified occlusion or stenosis of right middle cerebral artery: Secondary | ICD-10-CM | POA: Diagnosis not present

## 2019-02-16 DIAGNOSIS — G4700 Insomnia, unspecified: Secondary | ICD-10-CM | POA: Diagnosis present

## 2019-02-16 DIAGNOSIS — I69354 Hemiplegia and hemiparesis following cerebral infarction affecting left non-dominant side: Principal | ICD-10-CM

## 2019-02-16 DIAGNOSIS — F419 Anxiety disorder, unspecified: Secondary | ICD-10-CM | POA: Diagnosis present

## 2019-02-16 DIAGNOSIS — F1490 Cocaine use, unspecified, uncomplicated: Secondary | ICD-10-CM | POA: Diagnosis present

## 2019-02-16 DIAGNOSIS — Z7982 Long term (current) use of aspirin: Secondary | ICD-10-CM

## 2019-02-16 DIAGNOSIS — F1290 Cannabis use, unspecified, uncomplicated: Secondary | ICD-10-CM | POA: Diagnosis present

## 2019-02-16 DIAGNOSIS — I69312 Visuospatial deficit and spatial neglect following cerebral infarction: Secondary | ICD-10-CM | POA: Diagnosis not present

## 2019-02-16 DIAGNOSIS — G43909 Migraine, unspecified, not intractable, without status migrainosus: Secondary | ICD-10-CM | POA: Diagnosis not present

## 2019-02-16 DIAGNOSIS — Z716 Tobacco abuse counseling: Secondary | ICD-10-CM | POA: Diagnosis not present

## 2019-02-16 DIAGNOSIS — F1110 Opioid abuse, uncomplicated: Secondary | ICD-10-CM | POA: Diagnosis present

## 2019-02-16 DIAGNOSIS — R252 Cramp and spasm: Secondary | ICD-10-CM | POA: Diagnosis not present

## 2019-02-16 DIAGNOSIS — Z7151 Drug abuse counseling and surveillance of drug abuser: Secondary | ICD-10-CM

## 2019-02-16 DIAGNOSIS — F1910 Other psychoactive substance abuse, uncomplicated: Secondary | ICD-10-CM | POA: Diagnosis present

## 2019-02-16 DIAGNOSIS — E785 Hyperlipidemia, unspecified: Secondary | ICD-10-CM | POA: Diagnosis present

## 2019-02-16 DIAGNOSIS — F0630 Mood disorder due to known physiological condition, unspecified: Secondary | ICD-10-CM

## 2019-02-16 DIAGNOSIS — F17210 Nicotine dependence, cigarettes, uncomplicated: Secondary | ICD-10-CM | POA: Diagnosis present

## 2019-02-16 DIAGNOSIS — Z915 Personal history of self-harm: Secondary | ICD-10-CM | POA: Diagnosis not present

## 2019-02-16 DIAGNOSIS — K219 Gastro-esophageal reflux disease without esophagitis: Secondary | ICD-10-CM | POA: Diagnosis present

## 2019-02-16 DIAGNOSIS — I6330 Cerebral infarction due to thrombosis of unspecified cerebral artery: Secondary | ICD-10-CM | POA: Diagnosis not present

## 2019-02-16 DIAGNOSIS — F1111 Opioid abuse, in remission: Secondary | ICD-10-CM

## 2019-02-16 DIAGNOSIS — R799 Abnormal finding of blood chemistry, unspecified: Secondary | ICD-10-CM

## 2019-02-16 DIAGNOSIS — G8110 Spastic hemiplegia affecting unspecified side: Secondary | ICD-10-CM

## 2019-02-16 DIAGNOSIS — R1310 Dysphagia, unspecified: Secondary | ICD-10-CM | POA: Diagnosis not present

## 2019-02-16 DIAGNOSIS — F1310 Sedative, hypnotic or anxiolytic abuse, uncomplicated: Secondary | ICD-10-CM | POA: Diagnosis present

## 2019-02-16 DIAGNOSIS — Z7902 Long term (current) use of antithrombotics/antiplatelets: Secondary | ICD-10-CM | POA: Diagnosis not present

## 2019-02-16 DIAGNOSIS — F329 Major depressive disorder, single episode, unspecified: Secondary | ICD-10-CM | POA: Diagnosis present

## 2019-02-16 DIAGNOSIS — Z888 Allergy status to other drugs, medicaments and biological substances status: Secondary | ICD-10-CM

## 2019-02-16 DIAGNOSIS — Z882 Allergy status to sulfonamides status: Secondary | ICD-10-CM

## 2019-02-16 DIAGNOSIS — Z79899 Other long term (current) drug therapy: Secondary | ICD-10-CM

## 2019-02-16 DIAGNOSIS — Z818 Family history of other mental and behavioral disorders: Secondary | ICD-10-CM

## 2019-02-16 DIAGNOSIS — F331 Major depressive disorder, recurrent, moderate: Secondary | ICD-10-CM | POA: Diagnosis not present

## 2019-02-16 DIAGNOSIS — G441 Vascular headache, not elsewhere classified: Secondary | ICD-10-CM

## 2019-02-16 DIAGNOSIS — I639 Cerebral infarction, unspecified: Secondary | ICD-10-CM

## 2019-02-16 DIAGNOSIS — F1921 Other psychoactive substance dependence, in remission: Secondary | ICD-10-CM

## 2019-02-16 LAB — CBC
HCT: 47.5 % — ABNORMAL HIGH (ref 36.0–46.0)
Hemoglobin: 15.5 g/dL — ABNORMAL HIGH (ref 12.0–15.0)
MCH: 28.5 pg (ref 26.0–34.0)
MCHC: 32.6 g/dL (ref 30.0–36.0)
MCV: 87.5 fL (ref 80.0–100.0)
Platelets: 385 10*3/uL (ref 150–400)
RBC: 5.43 MIL/uL — ABNORMAL HIGH (ref 3.87–5.11)
RDW: 16.1 % — ABNORMAL HIGH (ref 11.5–15.5)
WBC: 8.6 10*3/uL (ref 4.0–10.5)
nRBC: 0 % (ref 0.0–0.2)

## 2019-02-16 LAB — CREATININE, SERUM
Creatinine, Ser: 0.59 mg/dL (ref 0.44–1.00)
GFR calc Af Amer: >60 mL/min (ref >60)
GFR calc non Af Amer: >60 mL/min (ref >60)

## 2019-02-16 MED ORDER — ZOLPIDEM TARTRATE 5 MG PO TABS: 5.0000 mg | ORAL_TABLET | Freq: Once | ORAL | Status: DC

## 2019-02-16 MED ORDER — HYDROXYZINE HCL 25 MG PO TABS: 25.0000 mg | ORAL_TABLET | Freq: Three times a day (TID) | ORAL | 0 refills | Status: DC | PRN

## 2019-02-16 MED ORDER — ACETAMINOPHEN 650 MG RE SUPP: 650.0000 mg | RECTAL | Status: DC | PRN

## 2019-02-16 MED ORDER — SORBITOL 70 % SOLN: 30.0000 mL | Freq: Every day | Status: DC | PRN

## 2019-02-16 MED ORDER — ACETAMINOPHEN 160 MG/5ML PO SOLN: 650.0000 mg | ORAL | Status: DC | PRN

## 2019-02-16 MED ORDER — PANTOPRAZOLE SODIUM 20 MG PO TBEC
20.0000 mg | DELAYED_RELEASE_TABLET | Freq: Every day | ORAL | Status: DC
  Filled 2019-02-16: qty 1

## 2019-02-16 MED ORDER — QUETIAPINE FUMARATE 50 MG PO TABS
100.0000 mg | ORAL_TABLET | Freq: Every day | ORAL | Status: DC
  Administered 2019-02-16 – 2019-02-20 (×5): 100 mg via ORAL
  Filled 2019-02-16 (×6): qty 2

## 2019-02-16 MED ORDER — ATORVASTATIN CALCIUM 80 MG PO TABS: 80.0000 mg | ORAL_TABLET | Freq: Every day | ORAL | Status: DC

## 2019-02-16 MED ORDER — NICOTINE 21 MG/24HR TD PT24
21.0000 mg | MEDICATED_PATCH | Freq: Every day | TRANSDERMAL | Status: DC
  Administered 2019-02-17 – 2019-02-27 (×10): 21 mg via TRANSDERMAL
  Filled 2019-02-16 (×13): qty 1

## 2019-02-16 MED ORDER — PANTOPRAZOLE SODIUM 20 MG PO TBEC: 20.0000 mg | DELAYED_RELEASE_TABLET | Freq: Every day | ORAL | Status: DC

## 2019-02-16 MED ORDER — ACETAMINOPHEN 325 MG PO TABS
650.0000 mg | ORAL_TABLET | ORAL | Status: DC | PRN
  Administered 2019-02-17 – 2019-02-28 (×15): 650 mg via ORAL
  Filled 2019-02-16 (×16): qty 2

## 2019-02-16 MED ORDER — ASPIRIN 81 MG PO CHEW: 81.0000 mg | CHEWABLE_TABLET | Freq: Every day | ORAL | Status: DC

## 2019-02-16 MED ORDER — ENOXAPARIN SODIUM 40 MG/0.4ML ~~LOC~~ SOLN
40.0000 mg | SUBCUTANEOUS | Status: DC
  Administered 2019-02-19 – 2019-02-27 (×7): 40 mg via SUBCUTANEOUS
  Filled 2019-02-16 (×10): qty 0.4

## 2019-02-16 MED ORDER — FLUOXETINE HCL 20 MG PO CAPS
20.0000 mg | ORAL_CAPSULE | Freq: Every day | ORAL | Status: DC
  Administered 2019-02-17 – 2019-02-22 (×6): 20 mg via ORAL
  Filled 2019-02-16 (×6): qty 1

## 2019-02-16 MED ORDER — CLOPIDOGREL BISULFATE 75 MG PO TABS: 75.0000 mg | ORAL_TABLET | Freq: Every day | ORAL | Status: DC

## 2019-02-16 MED ORDER — CLOPIDOGREL BISULFATE 75 MG PO TABS
75.0000 mg | ORAL_TABLET | Freq: Every day | ORAL | Status: DC
  Administered 2019-02-17 – 2019-02-28 (×12): 75 mg via ORAL
  Filled 2019-02-16 (×12): qty 1

## 2019-02-16 MED ORDER — QUETIAPINE FUMARATE 25 MG PO TABS: 25.0000 mg | ORAL_TABLET | Freq: Every morning | ORAL | Status: DC

## 2019-02-16 MED ORDER — ATORVASTATIN CALCIUM 80 MG PO TABS
80.0000 mg | ORAL_TABLET | Freq: Every day | ORAL | Status: DC
  Administered 2019-02-16 – 2019-02-27 (×12): 80 mg via ORAL
  Filled 2019-02-16 (×12): qty 1

## 2019-02-16 MED ORDER — ADULT MULTIVITAMIN W/MINERALS CH: 1.0000 | ORAL_TABLET | Freq: Every day | ORAL | Status: DC

## 2019-02-16 MED ORDER — STROKE: EARLY STAGES OF RECOVERY BOOK: 1.0000 | Freq: Once | Status: DC

## 2019-02-16 MED ORDER — FLUOXETINE HCL 20 MG PO CAPS: 20.0000 mg | ORAL_CAPSULE | Freq: Every day | ORAL | 3 refills | Status: DC

## 2019-02-16 MED ORDER — ADULT MULTIVITAMIN W/MINERALS CH
1.0000 | ORAL_TABLET | Freq: Every day | ORAL | Status: DC
  Administered 2019-02-17 – 2019-02-28 (×12): 1 via ORAL
  Filled 2019-02-16 (×12): qty 1

## 2019-02-16 MED ORDER — NICOTINE 21 MG/24HR TD PT24: 21.0000 mg | MEDICATED_PATCH | Freq: Every day | TRANSDERMAL | 0 refills | Status: DC

## 2019-02-16 MED ORDER — PANTOPRAZOLE SODIUM 40 MG PO TBEC
40.0000 mg | DELAYED_RELEASE_TABLET | Freq: Every day | ORAL | Status: DC
  Administered 2019-02-17 – 2019-02-28 (×12): 40 mg via ORAL
  Filled 2019-02-16 (×12): qty 1

## 2019-02-16 MED ORDER — ENOXAPARIN SODIUM 40 MG/0.4ML ~~LOC~~ SOLN: 40.0000 mg | SUBCUTANEOUS | Status: DC

## 2019-02-16 MED ORDER — SENNOSIDES-DOCUSATE SODIUM 8.6-50 MG PO TABS
1.0000 | ORAL_TABLET | Freq: Every evening | ORAL | Status: DC | PRN
  Administered 2019-02-18: 17:00:00 1 via ORAL
  Filled 2019-02-16: qty 1

## 2019-02-16 MED ORDER — ASPIRIN 81 MG PO CHEW
81.0000 mg | CHEWABLE_TABLET | Freq: Every day | ORAL | Status: DC
  Administered 2019-02-17 – 2019-02-28 (×12): 81 mg via ORAL
  Filled 2019-02-16 (×12): qty 1

## 2019-02-16 MED ORDER — QUETIAPINE FUMARATE 25 MG PO TABS
25.0000 mg | ORAL_TABLET | Freq: Every morning | ORAL | Status: DC
  Administered 2019-02-17 – 2019-02-21 (×5): 25 mg via ORAL
  Filled 2019-02-16 (×5): qty 1

## 2019-02-16 MED ORDER — ENSURE ENLIVE PO LIQD: 237.0000 mL | Freq: Two times a day (BID) | ORAL | 12 refills | Status: DC

## 2019-02-16 NOTE — PMR Pre-admission (Addendum)
PMR Admission Coordinator Pre-Admission Assessment   Patient: Jane Watkins is an 23 y.o., female MRN: [id number] DOB: 06/28/1995 Height: 5' 5" (165.1 cm) Weight: 59 kg                                                                                                                                                  Insurance Information HMO:     PPO: yes     PCP:      IPA:      80/20:      OTHER:  PRIMARY: [employer] Commercial (child policy)      Policy#: [id number]      Subscriber: Paul Skoglund (father) CM Name: tbd      Phone#: [phone]     Fax#: [phone] Pre-Cert#: [id number]      Employer:  Benefits:  Phone #: [phone]    Name:  Eff. Date: 02/11/19     Deduct: $0      Out of Pocket Max: $3500 ($0 met)      Life Max: n/a CIR: 80%      SNF: 80% limit 100 days Outpatient: 80%     Co-Pay: 20%, limited to 60 visits combined PT/OT/SLP Home Health: 80%      Co-Pay: 20% limited to 180 visits (1 visit =4 hours) DME: 80%     Co-Pay: 20% Providers:  In network SECONDARY:       Policy#:       Subscriber:  CM Name:       Phone#:      Fax#:  Pre-Cert#:       Employer:  Benefits:  Phone #:      Name:  Eff. Date:      Deduct:       Out of Pocket Max:       Life Max:  CIR:       SNF:  Outpatient:      Co-Pay:  Home Health:       Co-Pay:  DME:      Co-Pay:    Medicaid Application Date:       Case Manager:  Disability Application Date:       Case Worker:    The "Data Collection Information Summary" for patients in Inpatient Rehabilitation Facilities with attached "Privacy Act Statement-Health Care Records" was provided and verbally reviewed with: N/A   Emergency Contact Information Contact Information       Name Relation Home Work Mobile    Lindsley,Carol Mother     [phone]    Skoglund,Paul "mike" Father     [phone]    Skoglund,Lori Stepmother     [phone]         Current Medical History  Patient Admitting Diagnosis: 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  Patient on no prescription medications at time of admission.  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  She was discharged home with family ambulating extended distances with a rolling walker.  Presented 02/07/2019 after being found down and unresponsive.  EMS was contacted the patient did receive Narcan with some improvement in mental status.  Admission labs with WBC 20,700, potassium 5.2, BUN 25, creatinine 0.67, urine drug screen positive cocaine, marijuana and benzodiazepines, alcohol level less than 10 SARS coronavirus negative, blood cultures no growth to date.  Cranial CT scan showed areas of indistinct low-density in the right basal ganglia with mass-effect on the right lateral ventricle.  Patient did not receive TPA.  MRI showed acute infarction right MCA territory affecting the basal ganglia and affecting the cortical and subcortical brain in a largely watershed distribution.  CTA of head and neck showed no major arterial occlusion or significant stenosis.  Echocardiogram with ejection fraction of 60%.  Neurology follow-up currently maintained on aspirin and Plavix for CVA prophylaxis x3 weeks then aspirin alone with Plavix to be completed 03/02/2019.  Subcutaneous Lovenox for DVT prophylaxis.  Psychiatry consulted for depression and maintained on Seroquel as well as Prozac.  Tolerating a regular diet.  Therapy evaluations completed and patient was recommended for a comprehensive rehab program.       Complete NIHSS TOTAL: 6 Glasgow Coma Scale Score: (!) 20   Past Medical History      Past Medical History:  Diagnosis Date  . AKI (acute kidney injury) (HCC) 2018    "from overdose"  . Anxiety    . Daily headache    . Depression    . Drug overdose  04/2016    /notes 05/01/2016  . GERD (gastroesophageal reflux disease)      "when I was younger; gone now" (09/21/2017)  . Migraine      "q couple weeks" (09/21/2017)  . Overdose        Family History  family history includes Anemia in her father.   Prior Rehab/Hospitalizations:  Has the patient had prior rehab or hospitalizations prior to admission? Yes   Has the patient had major surgery during 100 days prior to admission? No   Current Medications    Current Facility-Administered Medications:  .   stroke: mapping our early stages of recovery book, , Does not apply, Once, Sreenath, Sudheer B, MD .  acetaminophen (TYLENOL) tablet 650 mg, 650 mg, Oral, Q4H PRN, 650 mg at 02/13/19 0228 **OR** acetaminophen (TYLENOL) 160 MG/5ML solution 650 mg, 650 mg, Per Tube, Q4H PRN **OR** acetaminophen (TYLENOL) suppository 650 mg, 650 mg, Rectal, Q4H PRN, Sreenath, Sudheer B, MD, 650 mg at 02/08/19 1702 .  aspirin chewable tablet 81 mg, 81 mg, Oral, Daily, Goodrich, Daniel P, MD, 81 mg at 02/15/19 0854 .  atorvastatin (LIPITOR) tablet 80 mg, 80 mg, Oral, q1800, Goodrich, Daniel P, MD, 80 mg at 02/15/19 1825 .  clopidogrel (PLAVIX) tablet 75 mg, 75 mg, Oral, Daily, Goodrich, Daniel P, MD, 75 mg at 02/15/19 0854 .  enoxaparin (LOVENOX) injection 40 mg, 40 mg, Subcutaneous, Q24H, Goodrich, Daniel P, MD, 40 mg at 02/12/19 1042 .  feeding supplement (ENSURE ENLIVE) (ENSURE ENLIVE) liquid 237 mL, 237 mL, Oral, BID BM, Alam, Tawfikul, MD .  FLUoxetine (PROZAC) capsule 20 mg, 20 mg, Oral, Daily, Goodrich, Daniel P, MD, 20 mg at 02/15/19 0853 .    hydrOXYzine (ATARAX/VISTARIL) tablet 25 mg, 25 mg, Oral, TID PRN, Goodrich, Daniel P, MD, 25 mg at 02/14/19 2041 .  multivitamin with minerals tablet 1 tablet, 1 tablet, Oral, Daily, Ayiku, Bernard, MD, 1 tablet at 02/15/19 0854 .  naloxone (NARCAN) injection 0.4 mg, 0.4 mg, Intravenous, PRN, Sreenath, Sudheer B, MD .  nicotine (NICODERM CQ - dosed in mg/24 hours) patch  21 mg, 21 mg, Transdermal, Daily, Goodrich, Daniel P, MD, 21 mg at 02/15/19 1825 .  pantoprazole (PROTONIX) EC tablet 20 mg, 20 mg, Oral, Daily, Alam, Tawfikul, MD, 20 mg at 02/15/19 1838 .  QUEtiapine (SEROQUEL) tablet 100 mg, 100 mg, Oral, QHS, Sreenath, Sudheer B, MD, 100 mg at 02/15/19 2101 .  QUEtiapine (SEROQUEL) tablet 25 mg, 25 mg, Oral, q morning - 10a, Goodrich, Daniel P, MD, 25 mg at 02/15/19 0854 .  senna-docusate (Senokot-S) tablet 1 tablet, 1 tablet, Oral, QHS PRN, Sreenath, Sudheer B, MD .  zolpidem (AMBIEN) tablet 5 mg, 5 mg, Oral, Once, Morrison, Brenda, NP   Patients Current Diet:  Diet Order                  Diet regular Room service appropriate? Yes; Fluid consistency: Thin  Diet effective now                      Precautions / Restrictions Precautions Precautions: Fall Precaution Comments: Seizure precautions; aspiration precautions Restrictions Weight Bearing Restrictions: No    Has the patient had 2 or more falls or a fall with injury in the past year?No   Prior Activity Level Community (5-7x/wk): driving, working at [employer]   Prior Functional Level Prior Function Level of Independence: Independent Comments: Per boyfriend/chart, pt indep and working   Self Care: Did the patient need help bathing, dressing, using the toilet or eating?  Independent   Indoor Mobility: Did the patient need assistance with walking from room to room (with or without device)? Independent   Stairs: Did the patient need assistance with internal or external stairs (with or without device)? Independent   Functional Cognition: Did the patient need help planning regular tasks such as shopping or remembering to take medications? Independent   Home Assistive Devices / Equipment Home Assistive Devices/Equipment: None Home Equipment: None   Prior Device Use: Indicate devices/aids used by the patient prior to current illness, exacerbation or injury? None of the above    Current Functional Level Cognition   Overall Cognitive Status: Within Functional Limits for tasks assessed Difficult to assess due to: Impaired communication Orientation Level: Oriented X4 General Comments: responds when talked to but does not initiate conversation    Extremity Assessment (includes Sensation/Coordination)   Upper Extremity Assessment: Difficult to assess due to impaired cognition, RUE deficits/detail, LUE deficits/detail RUE Deficits / Details: fair R hand grip strength; at least 3/5 AROM R elbow flexion/extension, shoulder flexion, and shoulder shrug LUE Deficits / Details: no movement initiation noted on L UE, likely sensory impairment, does not respond to noxious stimuli on L side (although nurse tech reports pt did respond when blood sugar taken on L index finger earlier) LUE Sensation: decreased light touch, decreased proprioception LUE Coordination: decreased fine motor, decreased gross motor  Lower Extremity Assessment: RLE deficits/detail, LLE deficits/detail, Difficult to assess due to impaired cognition RLE Deficits / Details: at least 3/5 hip flexion, knee flexion/extension, and DF/PF AROM LLE Deficits / Details: no initiation of movement noted L LE; increased tone noted intermittently with L LE with movement/mobility, +clonus   in ankle LLE Sensation: decreased light touch, decreased proprioception LLE Coordination: decreased fine motor, decreased gross motor     ADLs   Overall ADL's : Needs assistance/impaired Eating/Feeding: Set up, Independent Eating/Feeding Details (indicate cue type and reason): Limited endurance for self-feeding  applesauce using her nondominant RUE. Pt. is able to perform hand to mouth patterns with the utensil, and scooping complete set-up is provided, and stabilized in front of her. Grooming: Set up, Min guard, Minimal assistance Grooming Details (indicate cue type and reason): Pt set up with grooming supplies at tray table, Min A to  stand EOB, CGA to maintain standing balance with therapist providing proprioceptive input to LUE joints while pt performed standing grooming tasks with non-dom R hand. Min A to perform bilat tasks such as opening toothpaste, applying to toothbrush, and applying deodorant to R underarm. Cues for compensatory strategies manipulate objects Upper Body Bathing: Maximal assistance Lower Body Bathing: Total assistance Upper Body Dressing : Total assistance Lower Body Dressing: Total assistance Toilet Transfer: Minimal assistance, Min guard Toilet Transfer Details (indicate cue type and reason): Min guard t/fs to the right to BSCommode; MinA wih t/fs back to the bed with increased time to advance the Left LE. Toileting- Clothing Manipulation and Hygiene: Minimal assistance Functional mobility during ADLs: Min guard, Minimal assistance General ADL Comments: Pt demo's improvement in deficits, requiring at least min-mod assist for ADL tasks     Mobility   Overal bed mobility: Needs Assistance Bed Mobility: Supine to Sit Supine to sit: Supervision, HOB elevated Sit to supine: Min assist General bed mobility comments: Pt. initiated hooking her LLE under her right to slide over tot he EOB.     Transfers   Overall transfer level: Needs assistance Equipment used: Hemi-walker Transfers: Sit to/from Stand Sit to Stand: Min guard, Min assist Stand pivot transfers: Min guard, Min assist(Minguard to the right to BSCommond, MinA to the Left to bed.) General transfer comment: vc's for positioning to stand and also prior to sitting; posterior lean noted standing from bed initially (pt pushing B LE's against bed to stabilize)     Ambulation / Gait / Stairs / Wheelchair Mobility   Ambulation/Gait Ambulation/Gait assistance: Min assist Gait Distance (Feet): 130 Feet Assistive device: Hemi-walker Gait Pattern/deviations: Step-to pattern, Decreased step length - right, Decreased stance time - left General Gait  Details: slow gait with narrow BOS with decreased step length and height,  While no outright LOB with foward gait, she requires +1 assist at all times for balance and safety. Gait velocity: decreased     Posture / Balance Dynamic Sitting Balance Sitting balance - Comments: steady sitting reaching within BOS Balance Overall balance assessment: Needs assistance Sitting-balance support: No upper extremity supported Sitting balance-Leahy Scale: Good Sitting balance - Comments: steady sitting reaching within BOS Standing balance support: Single extremity supported, During functional activity Standing balance-Leahy Scale: Poor Standing balance comment: requires at least single UE support for static standing balance     Special needs/care consideration BiPAP/CPAP no CPM no Continuous Drip IV no Dialysis no        Days n/a Life Vest no Oxygen no Special Bed no Trach Size no Wound Vac (area) no      Location n/a Skin blisters to neck/chest                        Bowel mgmt: no Bladder mgmt: no Diabetic mgmt no Behavioral consideration hx of polysubstance abuse/OD Chemo/radiation           Previous Home Environment (from acute therapy documentation) Living Arrangements: (boyfriend) Type of Home: Apartment Home Layout: One level Home Access: Level entry Bathroom Shower/Tub: Tub/shower unit Bathroom Toilet: Standard Home Care Services: No Additional Comments: boyfriend present, provided home set up information, as pt unable to verbalize   Discharge Living Setting Plans for Discharge Living Setting: Patient's home, Apartment(lives with boyfriend, [name]) Type of Home at Discharge: Apartment Discharge Home Layout: One level Discharge Home Access: Level entry Discharge Bathroom Shower/Tub: Tub/shower unit Discharge Bathroom Toilet: Standard Discharge Bathroom Accessibility: Yes How Accessible: Accessible via walker Does the patient have any problems obtaining your medications?: No    Social/Family/Support Systems Anticipated Caregiver: [name] (BF), Carol LIndsley (Mom), Paul Skoglund (dad) Anticipated Caregiver's Contact Information: [name] [phone], Carol [phone], Paul [phone] (works 7a-7p) Ability/Limitations of Caregiver: [name] will be able to take some time off of work, and when he has to work he said he will drop Jane off at her mothers.  Carol is okay being backup for 24/7 as well.  Caregiver Availability: 24/7 Discharge Plan Discussed with Primary Caregiver: Yes Is Caregiver In Agreement with Plan?: Yes Does Caregiver/Family have Issues with Lodging/Transportation while Pt is in Rehab?: No     Goals/Additional Needs Patient/Family Goal for Rehab: PT/OT supervision to mod I Expected length of stay: 4-7 days Dietary Needs: reg/thin Equipment Needs: tbd Additional Information: Pt with history of polysubstance abuse and OD (here for same in 2018 with Dr. Swartz).  Pt/Family Agrees to Admission and willing to participate: Yes Program Orientation Provided & Reviewed with Pt/Caregiver Including Roles  & Responsibilities: Yes     Decrease burden of Care through IP rehab admission: n/a   Possible need for SNF placement upon discharge: not anticipated    Patient Condition: This patient's medical and functional status has changed since the consult dated: 02/11/19 in which the Rehabilitation Physician determined and documented that the patient's condition is appropriate for intensive rehabilitative care in an inpatient rehabilitation facility. See "History of Present Illness" (above) for medical update. Functional changes are: min assist x130'. Patient's medical and functional status update has been discussed with the Rehabilitation physician and patient remains appropriate for inpatient rehabilitation. Will admit to inpatient rehab today.   Preadmission Screen Completed By:  Caitlin E Warren, PT, DPT 02/16/2019 10:20  AM ______________________________________________________________________   Discussed status with Dr. Lataysha Vohra on 02/16/19 at 10:45 AM and received approval for admission today.   Admission Coordinator:  Caitlin E Warren, PT, DPT time 10:45 AM /Date 02/16/19    

## 2019-02-16 NOTE — Progress Notes (Addendum)
Inpatient Rehab Admissions Coordinator:   I have insurance approval and bed available for this pt today. I await word from Dr. Marylu Lund that she is ready to d/c.    Addendum: I have approval from Dr. Marylu Lund.  I let RN and CSW know.  [facility] arranged, however they recommended trying to set up alternative transport ([facility]) to ensure pt arrives to CIR today.   Estill Dooms, PT, DPT Admissions Coordinator [phone] 02/16/19  10:07 AM

## 2019-02-16 NOTE — Discharge Summary (Addendum)
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 Minimal chronic sinusitis changes. Electronically Signed   By: Charlett Nose M.D.   On: 02/07/2019 11:02       Subjective: Patient seen and examined this AM.  She has no complaints.  Denies chest pain, headache, shortness of breath, or any other complaints.  She is aware that she is being transferred to CIR today.  Discharge Exam: Vitals:   02/16/19 0522 02/16/19 0722  BP: 104/81 111/75  Pulse: 77 73  Resp: 14 17  Temp: 97.8 F (36.6 C) 98 F (36.7 C)  SpO2: 100% 100%   Vitals:   02/15/19 0741 02/15/19 1439 02/16/19 0522 02/16/19 0722  BP: 108/66 99/70 104/81 111/75  Pulse: 74 79 77 73  Resp: 18 18 14 17   Temp: 98.1 F (36.7 C) 98.4 F (36.9 C) 97.8 F (36.6 C) 98 F (36.7 C)  TempSrc: Oral Oral Oral Oral  SpO2: 100% 100% 100% 100%  Weight:      Height:        General: Pt is alert, awake, not in acute distress, lying in bed. Cardiovascular: RRR, S1/S2 +, no rubs, no gallops Respiratory: CTA bilaterally, no wheezing, no rhonchi Abdominal: Soft, NT, ND, bowel sounds + Extremities: no edema, no cyanosis    The results of significant diagnostics from this hospitalization (including imaging, microbiology, ancillary and laboratory) are listed below for reference.      Microbiology: Recent Results (from the past 240 hour(s))  SARS CORONAVIRUS 2 (TAT 6-24 HRS) Nasopharyngeal Nasopharyngeal Swab     Status: None   Collection Time: 02/07/19 10:32 AM   Specimen: Nasopharyngeal Swab  Result Value Ref Range Status   SARS Coronavirus 2 NEGATIVE NEGATIVE Final    Comment: (NOTE) SARS-CoV-2 target nucleic acids are NOT DETECTED. The SARS-CoV-2 RNA is generally detectable in upper and lower respiratory specimens during the acute phase of infection. Negative results do not preclude SARS-CoV-2 infection, do not rule out co-infections with other pathogens, and should not be used as the sole basis  for treatment or other patient management decisions. Negative results must be combined with clinical observations, patient history, and epidemiological information. The expected result is Negative. Fact Sheet for Patients: HairSlick.no Fact Sheet for Healthcare Providers: quierodirigir.com This test is not yet approved or cleared by the [place] FDA and  has been authorized for detection and/or diagnosis of SARS-CoV-2 by FDA under an Emergency Use Authorization (EUA). This EUA will remain  in effect (meaning this test can be used) for the duration of the COVID-19 declaration under Section 56 4(b)(1) of the Act, 21 U.S.C. section 360bbb-3(b)(1), unless the authorization is terminated or revoked sooner. Performed at [facility], [address] [place], [place] [address]   Blood culture (routine x 2)     Status: None   Collection Time: 02/07/19 11:34 AM   Specimen: BLOOD  Result Value Ref Range Status   Specimen Description BLOOD BLOOD RIGHT ARM  Final   Special Requests   Final    BOTTLES DRAWN AEROBIC AND ANAEROBIC Blood Culture results may not be optimal due to an excessive volume of blood received in culture bottles   Culture   Final    NO GROWTH 5 DAYS Performed at [facility],  [address] [place], [place] [address]    Report Status 02/12/2019 FINAL  Final  Blood culture (routine x 2)     Status: None   Collection Time: 02/07/19 11:34 AM   Specimen: BLOOD  Result Value Ref Range Status   Specimen Description BLOOD BLOOD LEFT FOREARM  Final   Special Requests   Final    BOTTLES DRAWN AEROBIC AND ANAEROBIC Blood Culture results may not be optimal due to an excessive volume of blood received in culture bottles   Culture   Final    NO GROWTH 5 DAYS Performed at [facility], [address] [place], [place] [address]    Report Status 02/12/2019 FINAL  Final  CULTURE, BLOOD (ROUTINE X 2) w Reflex to ID Panel     Status: None   Collection Time: 02/08/19  5:42 PM   Specimen: BLOOD  Result Value Ref Range Status   Specimen Description BLOOD RIGHT ANTECUBITAL  Final   Special Requests   Final    BOTTLES DRAWN AEROBIC AND ANAEROBIC Blood Culture adequate volume   Culture   Final    NO GROWTH 5 DAYS Performed at [facility], [address] [place], [place] [address]    Report Status 02/13/2019 FINAL  Final  CULTURE, BLOOD (ROUTINE X 2) w Reflex to ID Panel     Status: None   Collection Time: 02/08/19  6:01 PM   Specimen: BLOOD  Result Value Ref Range Status   Specimen Description BLOOD BLOOD LEFT HAND  Final   Special Requests   Final    BOTTLES DRAWN AEROBIC AND ANAEROBIC Blood Culture adequate volume   Culture   Final    NO GROWTH 5 DAYS Performed at [facility], [address] [place], [place] [address]    Report Status 02/13/2019 FINAL  Final     Labs: BNP (last 3 results) No results for input(s): BNP in the last 8760 hours. Basic Metabolic Panel: Recent Labs  Lab 02/10/19 0305 02/13/19 0919 02/14/19 0337  NA 146* 142  --   K 3.3* 3.7  --   CL 114* 112*  --   CO2 24 20*  --   GLUCOSE 84 107*  --   BUN 7 13  --  CREATININE 0.47 0.58 0.55  CALCIUM 8.4* 8.8*  --    Liver Function Tests: No results for  input(s): AST, ALT, ALKPHOS, BILITOT, PROT, ALBUMIN in the last 168 hours. No results for input(s): LIPASE, AMYLASE in the last 168 hours. No results for input(s): AMMONIA in the last 168 hours. CBC: No results for input(s): WBC, NEUTROABS, HGB, HCT, MCV, PLT in the last 168 hours. Cardiac Enzymes: Recent Labs  Lab 02/10/19 0305 02/13/19 0919  CKTOTAL 2,245* 182   BNP: Invalid input(s): POCBNP CBG: Recent Labs  Lab 02/10/19 0833 02/10/19 1159 02/10/19 1950 02/11/19 0001 02/11/19 0403  GLUCAP 82 77 132* 119* 95   D-Dimer No results for input(s): DDIMER in the last 72 hours. Hgb A1c No results for input(s): HGBA1C in the last 72 hours. Lipid Profile No results for input(s): CHOL, HDL, LDLCALC, TRIG, CHOLHDL, LDLDIRECT in the last 72 hours. Thyroid function studies No results for input(s): TSH, T4TOTAL, T3FREE, THYROIDAB in the last 72 hours.  Invalid input(s): FREET3 Anemia work up No results for input(s): VITAMINB12, FOLATE, FERRITIN, TIBC, IRON, RETICCTPCT in the last 72 hours. Urinalysis    Component Value Date/Time   COLORURINE YELLOW (A) 02/07/2019 0938   APPEARANCEUR CLEAR (A) 02/07/2019 0938   LABSPEC 1.015 02/07/2019 0938   PHURINE 5.0 02/07/2019 0938   GLUCOSEU >=500 (A) 02/07/2019 0938   HGBUR NEGATIVE 02/07/2019 0938   BILIRUBINUR NEGATIVE 02/07/2019 0938   KETONESUR NEGATIVE 02/07/2019 0938   PROTEINUR 100 (A) 02/07/2019 0938   NITRITE NEGATIVE 02/07/2019 0938   LEUKOCYTESUR NEGATIVE 02/07/2019 0938   Sepsis Labs Invalid input(s): PROCALCITONIN,  WBC,  LACTICIDVEN Microbiology Recent Results (from the past 240 hour(s))  SARS CORONAVIRUS 2 (TAT 6-24 HRS) Nasopharyngeal Nasopharyngeal Swab     Status: None   Collection Time: 02/07/19 10:32 AM   Specimen: Nasopharyngeal Swab  Result Value Ref Range Status   SARS Coronavirus 2 NEGATIVE NEGATIVE Final    Comment: (NOTE) SARS-CoV-2 target nucleic acids are NOT DETECTED. The SARS-CoV-2 RNA is generally  detectable in upper and lower respiratory specimens during the acute phase of infection. Negative results do not preclude SARS-CoV-2 infection, do not rule out co-infections with other pathogens, and should not be used as the sole basis for treatment or other patient management decisions. Negative results must be combined with clinical observations, patient history, and epidemiological information. The expected result is Negative. Fact Sheet for Patients: SugarRoll.be Fact Sheet for Healthcare Providers: https://www.woods-mathews.com/ This test is not yet approved or cleared by the [place] FDA and  has been authorized for detection and/or diagnosis of SARS-CoV-2 by FDA under an Emergency Use Authorization (EUA). This EUA will remain  in effect (meaning this test can be used) for the duration of the COVID-19 declaration under Section 56 4(b)(1) of the Act, 21 U.S.C. section 360bbb-3(b)(1), unless the authorization is terminated or revoked sooner. Performed at [facility], [place] [address] [place], [place] [address]   Blood culture (routine x 2)     Status: None   Collection Time: 02/07/19 11:34 AM   Specimen: BLOOD  Result Value Ref Range Status   Specimen Description BLOOD BLOOD RIGHT ARM  Final   Special Requests   Final    BOTTLES DRAWN AEROBIC AND ANAEROBIC Blood Culture results may not be optimal due to an excessive volume of blood received in culture bottles   Culture   Final    NO GROWTH 5 DAYS Performed at [facility], [address] [place],  [place] [address]    Report Status 02/12/2019 FINAL  Final  Blood culture (routine x 2)     Status: None   Collection Time: 02/07/19 11:34 AM   Specimen: BLOOD  Result Value Ref Range Status   Specimen Description BLOOD BLOOD LEFT FOREARM  Final   Special Requests   Final    BOTTLES DRAWN AEROBIC AND ANAEROBIC Blood Culture results may not be optimal due to an  excessive volume of blood received in culture bottles   Culture   Final    NO GROWTH 5 DAYS Performed at [facility], [address] [place], [place] [address]    Report Status 02/12/2019 FINAL  Final  CULTURE, BLOOD (ROUTINE X 2) w Reflex to ID Panel     Status: None   Collection Time: 02/08/19  5:42 PM   Specimen: BLOOD  Result Value Ref Range Status   Specimen Description BLOOD RIGHT ANTECUBITAL  Final   Special Requests   Final    BOTTLES DRAWN AEROBIC AND ANAEROBIC Blood Culture adequate volume   Culture   Final    NO GROWTH 5 DAYS Performed at [facility], [address] [place], [place] [address]    Report Status 02/13/2019 FINAL  Final  CULTURE, BLOOD (ROUTINE X 2) w Reflex to ID Panel     Status: None   Collection Time: 02/08/19  6:01 PM   Specimen: BLOOD  Result Value Ref Range Status   Specimen Description BLOOD BLOOD LEFT HAND  Final   Special Requests   Final    BOTTLES DRAWN AEROBIC AND ANAEROBIC Blood Culture adequate volume   Culture   Final    NO GROWTH 5 DAYS Performed at [facility], [address], [place], [place] [address]    Report Status 02/13/2019 FINAL  Final     Time coordinating discharge: Over 30 minutes  SIGNED:   Lynn Ito, MD  [facility] 02/16/2019, 10:59 AM Pager   If 7PM-7AM, please contact night-coverage [email or web address] Password [email or web address]

## 2019-02-16 NOTE — Plan of Care (Signed)
  Problem: Education: Goal: Knowledge of General Education information will improve Description: Including pain rating scale, medication(s)/side effects and non-pharmacologic comfort measures Outcome: Progressing   Problem: Education: Goal: Knowledge of disease or condition will improve Outcome: Progressing Goal: Knowledge of secondary prevention will improve Outcome: Progressing Goal: Knowledge of patient specific risk factors addressed and post discharge goals established will improve Outcome: Progressing Goal: Individualized Educational Video(s) Outcome: Progressing   Problem: Coping: Goal: Will verbalize positive feelings about self Outcome: Progressing Goal: Will identify appropriate support needs Outcome: Progressing   Problem: Health Behavior/Discharge Planning: Goal: Ability to manage health-related needs will improve Outcome: Progressing   Problem: Self-Care: Goal: Ability to participate in self-care as condition permits will improve Outcome: Progressing Goal: Ability to communicate needs accurately will improve Outcome: Progressing   Problem: Nutrition: Goal: Risk of aspiration will decrease Outcome: Progressing   Problem: Ischemic Stroke/TIA Tissue Perfusion: Goal: Complications of ischemic stroke/TIA will be minimized Outcome: Progressing   

## 2019-02-16 NOTE — Progress Notes (Signed)
Physical Medicine and Rehabilitation Consult Reason for Consult: Impaired mobility and ADLs following CVA Referring Physician: Dr. Brendia Sacks     HPI: Jane Watkins is a 24 y.o. 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 Urine drug screen was positive for benzodiazepines, cocaine, and marijuana.  She is left-handed and works as a Production designer, theatre/television/film at [employer]. She lives with her boy friend.    TEE yesterday.     ROS negative except as indicated in HPI.     Past Medical History:  Diagnosis Date  . AKI (acute kidney injury) (HCC) 2018    "from overdose"  . Anxiety    . Daily headache    . Depression    . Drug overdose 04/2016    [name] 05/01/2016  . GERD (gastroesophageal reflux disease)      "when I was younger; gone now" (09/21/2017)  . Migraine      "q couple weeks" (09/21/2017)  . Overdose           Past Surgical History:  Procedure Laterality Date  . APPENDECTOMY   04/2013  . I & D EXTREMITY Left 09/20/2017    Procedure: IRRIGATION AND DEBRIDEMENT LEFT ARM;  Surgeon: Bradly Bienenstock, MD;  Location: [facility] OR;  Service: Orthopedics;  Laterality: Left;  . TONGUE SURGERY   ~ 2007    "related to lisp"  . TRACHEOSTOMY   04/2016    [name] 05/01/2016    Family History  Problem Relation Age of Onset  . Anemia Father      Social History:  reports that she has been smoking cigarettes. She has a 5.00 pack-year smoking history. She has never used smokeless tobacco. She reports current drug use. Drugs: IV, Heroin, and Cocaine. She reports that she does not drink alcohol. Allergies:       Allergies  Allergen Reactions  . Other Rash      Tide detergent   . Sulfa Antibiotics Rash    No medications prior to admission.      Home: Home Living Family/patient expects to be discharged to:: Private  residence Living Arrangements: (boyfriend) Type of Home: Apartment Home Access: Level entry Home Layout: One level Bathroom Shower/Tub: Engineer, manufacturing systems: Standard Home Equipment: None Additional Comments: boyfriend present, provided home set up information, as pt unable to verbalize  Functional History: Prior Function Level of Independence: Independent Comments: Per boyfriend/chart, pt indep and working Functional Status:  Mobility: Bed Mobility Overal bed mobility: Needs Assistance Bed Mobility: Supine to Sit Supine to sit: Mod assist, Max assist, HOB elevated Sit to supine: Min assist, Mod assist, +2 for physical assistance General bed mobility comments: assist for trunk and L>R LE (sat up on L side of bed); with cueing pt able to use bottom of bed rail with R UE to scoot towards edge of bed with CGA for safety Transfers Overall transfer level: Needs assistance Equipment used: 1 person hand held assist Transfers: Sit to/from Stand, Stand Pivot Transfers Sit to Stand: Min assist, +2 physical assistance Stand pivot transfers: Min assist, +2 physical assistance General transfer comment: min assist x2 to perform stand pivot bed to [facility] to R (cueing for positioning and R UE placement); x2 trials standing from [facility] with min assist x2 (1st trial pt stood with assist for clean-up post  toileting and assist with donning underwear; pt then sat back down in order to set-up for mobility to recliner) Ambulation/Gait Ambulation/Gait assistance: Min assist, Mod assist, +2 physical assistance Gait Distance (Feet): 3 Feet Assistive device: 1 person hand held assist General Gait Details: pt walked forward (from [facility] towards recliner), then turned to the R, then took a couple steps backwards, and then sat in recliner with 2 assist; assist to advance L LE each step (muscle activation noted L hip flexors though); assist to block L knee and make sure L ankle properly aligned prior to taking a  step with R LE; vc's to shift weight to R in order to advance L LE; hand hold assist with R UE and assist for L UE support during activity Gait velocity: decreased   ADL: ADL Overall ADL's : Needs assistance/impaired Eating/Feeding: Set up, Minimal assistance Eating/Feeding Details (indicate cue type and reason): Limited endurance for self-feeding  applesauce using her nondominant RUE. Pt. is able to perform hand to mouth patterns with the utensil, and scooping complete set-up is provided, and stabilized in front of her. Grooming: Maximal assistance Upper Body Bathing: Maximal assistance Lower Body Bathing: Total assistance Upper Body Dressing : Total assistance Lower Body Dressing: Total assistance Toileting- Clothing Manipulation and Hygiene: Total assistance General ADL Comments: limited ADL assessment 2/2 decreased arousal/alertness, per nurse tech/sitter present, pt able to attempt self feeding this morning after PT session using non-dom R hand but had difficult performing 2/2 decreased ability to maintain alertness for task   Cognition: Cognition Overall Cognitive Status: Difficult to assess Orientation Level: Oriented X4 Cognition Arousal/Alertness: Awake/alert Behavior During Therapy: Flat affect Overall Cognitive Status: Difficult to assess General Comments: Pt occasionally speaking during session; otherwise intermittently nodding head yes/no to questions Difficult to assess due to: Impaired communication   Blood pressure 115/74, pulse (!) 51, temperature 98.8 F (37.1 C), temperature source Oral, resp. rate (!) 21, height 5\' 5"  (1.651 m), weight 59 kg, last menstrual period 01/31/2019, SpO2 100 %. Physical Exam General: Alert and oriented x 3, No apparent distress HEENT: Head is normocephalic, atraumatic, PERRLA, EOMI, sclera anicteric, oral mucosa pink and moist, dentition intact, ext ear canals clear,  Neck: Supple without JVD or lymphadenopathy Heart: Reg rate and  rhythm. No murmurs rubs or gallops Chest: CTA bilaterally without wheezes, rales, or rhonchi; no distress Abdomen: Soft, non-tender, non-distended, bowel sounds positive. Extremities: No clubbing, cyanosis, or edema. Pulses are 2+ Skin: Clean and intact without signs of breakdown Neuro/Musculoskeletal:: Pt is cognitively appropriate with normal insight, memory, and awareness. Cranial nerves 2-12 are intact except for left facial droop. Sensory exam is normal. Reflexes are 2+ in all 4's. Fine motor coordination is intact. No tremors. Motor function is grossly 5/5 on right side and 0/5 on left side, exam limited by patient's fatigue Psych: Pt's affect flat, fatiged     Lab Results Last 24 Hours       Results for orders placed or performed during the hospital encounter of 02/07/19 (from the past 24 hour(s))  Glucose, capillary     Status: Abnormal    Collection Time: 02/09/19 12:47 PM  Result Value Ref Range    Glucose-Capillary 100 (H) 70 - 99 mg/dL    Comment 1 Notify RN    Glucose, capillary     Status: Abnormal    Collection Time: 02/09/19  4:03 PM  Result Value Ref Range    Glucose-Capillary 103 (H) 70 - 99 mg/dL    Comment 1 Notify  RN    Glucose, capillary     Status: Abnormal    Collection Time: 02/09/19  8:25 PM  Result Value Ref Range    Glucose-Capillary 137 (H) 70 - 99 mg/dL  BMP in AM     Status: Abnormal    Collection Time: 02/10/19  3:05 AM  Result Value Ref Range    Sodium 146 (H) 135 - 145 mmol/L    Potassium 3.3 (L) 3.5 - 5.1 mmol/L    Chloride 114 (H) 98 - 111 mmol/L    CO2 24 22 - 32 mmol/L    Glucose, Bld 84 70 - 99 mg/dL    BUN 7 6 - 20 mg/dL    Creatinine, Ser 9.02 0.44 - 1.00 mg/dL    Calcium 8.4 (L) 8.9 - 10.3 mg/dL    GFR calc non Af Amer >60 >60 mL/min    GFR calc Af Amer >60 >60 mL/min    Anion gap 8 5 - 15  CK     Status: Abnormal    Collection Time: 02/10/19  3:05 AM  Result Value Ref Range    Total CK 2,245 (H) 38 - 234 U/L  Glucose, capillary      Status: None    Collection Time: 02/10/19  4:17 AM  Result Value Ref Range    Glucose-Capillary 77 70 - 99 mg/dL  Urine Drug Screen, Qualitative ([facility] only)     Status: Abnormal    Collection Time: 02/10/19  7:33 AM  Result Value Ref Range    Tricyclic, Ur Screen NONE DETECTED NONE DETECTED    Amphetamines, Ur Screen NONE DETECTED NONE DETECTED    MDMA (Ecstasy)Ur Screen NONE DETECTED NONE DETECTED    Cocaine Metabolite,Ur [place] NONE DETECTED NONE DETECTED    Opiate, Ur Screen NONE DETECTED NONE DETECTED    Phencyclidine (PCP) Ur S NONE DETECTED NONE DETECTED    Cannabinoid 50 Ng, Ur [place] POSITIVE (A) NONE DETECTED    Barbiturates, Ur Screen NONE DETECTED NONE DETECTED    Benzodiazepine, Ur Scrn NONE DETECTED NONE DETECTED    Methadone Scn, Ur NONE DETECTED NONE DETECTED       Imaging Results (Last 48 hours)  ECHO TEE   Result Date: 02/10/2019   TRANSESOPHOGEAL ECHO REPORT   Patient Name:   Jane Watkins Date of Exam: 02/10/2019 Medical Rec #:  [id number]  Height:       65.0 in Accession #:    [id number] Weight:       130.1 lb Date of Birth:  [birth year]   BSA:          1.65 m Patient Age:    23 years   BP:           126/79 mmHg Patient Gender: F          HR:           61 bpm. Exam Location:  [facility]  Procedure: Transesophageal Echo, Color Doppler, Cardiac Doppler and Saline            Contrast Bubble Study Indications:     Stroke 434.91  History:         Patient has prior history of Echocardiogram examinations, most                  recent 02/07/2019. Anxiety, migraine , Overdose.  Sonographer:     Cristela Blue RDCS (AE) Referring Phys:  [id number] Lennon Alstrom Diagnosing Phys: Debbe Odea MD  PROCEDURE: The transesophogeal probe was  passed through the esophogus of the patient. Image quality was good. The patient developed no complications during the procedure. TEE SUMMARY  There was no evidence of thrombus or vegetation/endocarditis in this study.  IMPRESSIONS  1. Left ventricular ejection  fraction, by visual estimation, is 55 to 60%. The left ventricle has normal function. There is no left ventricular hypertrophy.  2. The left ventricle has no regional wall motion abnormalities.  3. Global right ventricle has normal systolic function.The right ventricular size is normal. No increase in right ventricular wall thickness.  4. Left atrial size was normal.  5. Right atrial size was normal.  6. The mitral valve is normal in structure. Trivial mitral valve regurgitation.  7. The tricuspid valve is normal in structure.  8. The aortic valve is normal in structure. Aortic valve regurgitation is not visualized.  9. The pulmonic valve was grossly normal. Pulmonic valve regurgitation is not visualized. FINDINGS  Left Ventricle: Left ventricular ejection fraction, by visual estimation, is 55 to 60%. The left ventricle has normal function. The left ventricle has no regional wall motion abnormalities. The left ventricular internal cavity size was the left ventricle is normal in size. There is no left ventricular hypertrophy. Right Ventricle: The right ventricular size is normal. No increase in right ventricular wall thickness. Global RV systolic function is has normal systolic function. Left Atrium: Left atrial size was normal in size. Right Atrium: Right atrial size was normal in size Pericardium: There is no evidence of pericardial effusion. Mitral Valve: The mitral valve is normal in structure. Trivial mitral valve regurgitation. Tricuspid Valve: The tricuspid valve is normal in structure. Tricuspid valve regurgitation is trivial. Aortic Valve: The aortic valve is normal in structure. Aortic valve regurgitation is not visualized. Pulmonic Valve: The pulmonic valve was grossly normal. Pulmonic valve regurgitation is not visualized. Aorta: The aortic root is normal in size and structure. Venous: The inferior vena cava was not well visualized. Shunts: Agitated saline contrast was given intravenously to evaluate for  intracardiac shunting. Saline contrast bubble study was negative, with no evidence of any interatrial shunt. No ventricular septal defect is seen or detected. There is no evidence of an atrial septal defect. No atrial level shunt detected by color flow Doppler.  Debbe OdeaBrian Agbor-Etang MD Electronically signed by Debbe OdeaBrian Agbor-Etang MD Signature Date/Time: 02/10/2019/10:53:50 AM    Final          Assessment/Plan: Diagnosis: Impaired mobility and ADLs secondary to right MCA stroke 1. Does the need for close, 24 hr/day medical supervision in concert with the patient's rehab needs make it unreasonable for this patient to be served in a less intensive setting? Yes 2. Co-Morbidities requiring supervision/potential complications: UDS positive for cocaine, benzos, THC, major depressive disorder, dysphagia, elevated CK and liver enzymes 3. Due to bladder management, bowel management, safety, skin/wound care, disease management, medication administration, pain management and patient education, does the patient require 24 hr/day rehab nursing? Yes 4. Does the patient require coordinated care of a physician, rehab nurse, therapy disciplines of PT, OT, SLP to address physical and functional deficits in the context of the above medical diagnosis(es)? Yes Addressing deficits in the following areas: balance, endurance, locomotion, strength, transferring, bowel/bladder control, bathing, dressing, feeding, grooming, toileting, cognition, speech, language, swallowing and psychosocial support 5. Can the patient actively participate in an intensive therapy program of at least 3 hrs of therapy per day at least 5 days per week? Yes 6. The potential for patient to make measurable gains while on inpatient  rehab is good 7. Anticipated functional outcomes upon discharge from inpatient rehab are modified independent  with PT, modified independent with OT, independent with SLP. 8. Estimated rehab length of stay to reach the above  functional goals is: 20-22 days 9. Anticipated discharge destination: Home 10. Overall Rehab/Functional Prognosis: good   RECOMMENDATIONS: This patient's condition is appropriate for continued rehabilitative care in the following setting: CIR Patient has agreed to participate in recommended program. Yes Note that insurance prior authorization may be required for reimbursement for recommended care.   Comment: Patient has dense left-sided hemiplegia and is left handed and was independent prior to stroke. She would benefit from inpatient rehab. Would need to establish family support prior to rehab admission. As per nursing note, boy friend is not allowed to visit due to poor compliance with rules and disruptive visit.    Izora Ribas, MD 02/10/2019

## 2019-02-16 NOTE — H&P (Signed)
Physical Medicine and Rehabilitation Admission H&P    Chief Complaint  Patient presents with  . Drug Overdose  : HPI: Jane Watkins is a 24 year old left-handed female with history of IV drug use, polysubstance/tobacco abuse, anxiety.  History taken from chart review, patient, and boyfriend. Patient on no prescription medications at time of admission.  Patient lives with her boyfriend.  She reportedly works at [employer].  Independent prior to admission.  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  She was discharged home with family ambulating extended distances with a rolling walker.  Presented 02/07/2019 after being found unresponsive. EMS was contacted the patient received Narcan with some improvement in mental status.  Admission labs with WBC 20,700, potassium 5.2, BUN 25, creatinine 0.67, urine drug screen positive cocaine, marijuana and benzodiazepines, alcohol level less than 10, SARS coronavirus negative, blood cultures no growth to date.  Cranial CT scan showed areas of indistinct low-density in the right basal ganglia with mass-effect on the right lateral ventricle.  Patient did not receive TPA.  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  CTA of head and neck showed no major arterial occlusion or significant stenosis.  Echocardiogram with EF of 60%. Neurology follow-up currently maintained on aspirin and Plavix for CVA prophylaxis x3 weeks then aspirin alone with Plavix to be completed 03/02/2019.  Subcutaneous Lovenox for DVT prophylaxis.  Psychiatry consulted for depression and maintained on Seroquel as well as Prozaac.  Tolerating a regular diet.  Therapy evaluations completed and patient was admitted for a comprehensive rehab program. Please see  preadmission assessment from earlier today as well.   Review of Systems  Constitutional: Negative for chills and fever.  HENT: Negative for hearing loss.   Eyes: Negative for blurred vision and double vision.  Respiratory: Negative for cough and shortness of breath.   Cardiovascular: Negative for chest pain, palpitations and leg swelling.  Gastrointestinal: Positive for constipation. Negative for heartburn and nausea.       GERD  Genitourinary: Negative for dysuria, flank pain and hematuria.  Musculoskeletal: Positive for joint pain and myalgias.  Skin: Negative for rash.  Neurological: Positive for focal weakness and headaches. Negative for sensory change.  Psychiatric/Behavioral: Positive for depression. The patient has insomnia.        Anxiety   Past Medical History:  Diagnosis Date  . AKI (acute kidney injury) (HCC) 2018   "from overdose"  . Anxiety   . Daily headache   . Depression   . Drug overdose 04/2016   [name] 05/01/2016  . GERD (gastroesophageal reflux disease)    "when I was younger; gone now" (09/21/2017)  . Migraine    "q couple weeks" (09/21/2017)  . Overdose    Past Surgical History:  Procedure Laterality Date  . APPENDECTOMY  04/2013  . I & D EXTREMITY Left 09/20/2017   Procedure: IRRIGATION AND DEBRIDEMENT LEFT ARM;  Surgeon: Bradly Bienenstock, MD;  Location: [facility] OR;  Service: Orthopedics;  Laterality: Left;  . TEE WITHOUT CARDIOVERSION N/A 02/10/2019   Procedure: TRANSESOPHAGEAL ECHOCARDIOGRAM (TEE);  Surgeon: Debbe Odea, MD;  Location: [facility] ORS;  Service: Cardiovascular;  Laterality: N/A;  Polysubstance abuser  . TONGUE SURGERY  ~ 2007   "related to lisp"  . TRACHEOSTOMY  04/2016   [name] 05/01/2016   Family History  Problem Relation Age of  Onset  . Anemia Father    Social History:  reports that she has been smoking cigarettes. She has a 5.00 pack-year smoking history. She has never used smokeless tobacco. She reports current drug use. Drugs: IV,  Heroin, and Cocaine. She reports that she does not drink alcohol. Allergies:  Allergies  Allergen Reactions  . Other Rash    Tide detergent   . Sulfa Antibiotics Rash   Medications Prior to Admission  Medication Sig Dispense Refill  .  stroke: mapping our early stages of recovery book MISC 1 each by Does not apply route once for 1 dose.    Marland Kitchen aspirin 81 MG chewable tablet Chew 1 tablet (81 mg total) by mouth daily.    Marland Kitchen atorvastatin (LIPITOR) 80 MG tablet Take 1 tablet (80 mg total) by mouth daily at 6 PM.    . clopidogrel (PLAVIX) 75 MG tablet Take 1 tablet (75 mg total) by mouth daily.    . feeding supplement, ENSURE ENLIVE, (ENSURE ENLIVE) LIQD Take 237 mLs by mouth 2 (two) times daily between meals. 237 mL 12  . FLUoxetine (PROZAC) 20 MG capsule Take 1 capsule (20 mg total) by mouth daily.  3  . hydrOXYzine (ATARAX/VISTARIL) 25 MG tablet Take 1 tablet (25 mg total) by mouth 3 (three) times daily as needed for anxiety. 30 tablet 0  . Multiple Vitamin (MULTIVITAMIN WITH MINERALS) TABS tablet Take 1 tablet by mouth daily.    . nicotine (NICODERM CQ - DOSED IN MG/24 HOURS) 21 mg/24hr patch Place 1 patch (21 mg total) onto the skin daily. 28 patch 0  . pantoprazole (PROTONIX) 20 MG tablet Take 1 tablet (20 mg total) by mouth daily.    . QUEtiapine (SEROQUEL) 25 MG tablet Take 1 tablet (25 mg total) by mouth every morning.      Drug Regimen Review Drug regimen was reviewed and remains appropriate with no significant issues identified  Home: Home Living Family/patient expects to be discharged to:: Private residence Living Arrangements: (boyfriend) Type of Home: Apartment Home Access: Level entry Home Layout: One level Bathroom Shower/Tub: Chiropodist: Standard Home Equipment: None Additional Comments: boyfriend present, provided home set up information, as pt unable to verbalize   Functional History: Prior Function Level of Independence: Independent Comments:  Per boyfriend/chart, pt indep and working  Functional Status:  Mobility: Bed Mobility Overal bed mobility: Needs Assistance Bed Mobility: Supine to Sit Supine to sit: Supervision, HOB elevated Sit to supine: Min assist General bed mobility comments: Pt. initiated hooking her LLE under her right to slide over tot he EOB. Transfers Overall transfer level: Needs assistance Equipment used: Hemi-walker Transfers: Sit to/from Stand Sit to Stand: Min guard, Min assist Stand pivot transfers: Min guard, Min assist([place] to the right to BS[place], MinA to the Left to bed.) General transfer comment: vc's for positioning to stand and also prior to sitting; posterior lean noted standing from bed initially (pt pushing B LE's against bed to stabilize) Ambulation/Gait Ambulation/Gait assistance: Min assist Gait Distance (Feet): 130 Feet Assistive device: Hemi-walker Gait Pattern/deviations: Step-to pattern, Decreased step length - right, Decreased stance time - left General Gait Details: slow gait with narrow BOS with decreased step length and height,  While no outright LOB with foward gait, she requires +1 assist at all times for balance and safety. Gait velocity: decreased    ADL: ADL Overall ADL's : Needs assistance/impaired Eating/Feeding: Set up, Independent Eating/Feeding Details (indicate cue type and reason): Limited endurance for self-feeding  applesauce  using her nondominant RUE. Pt. is able to perform hand to mouth patterns with the utensil, and scooping complete set-up is provided, and stabilized in front of her. Grooming: Set up, Min guard, Minimal assistance Grooming Details (indicate cue type and reason): Pt set up with grooming supplies at tray table, Min A to stand EOB, CGA to maintain standing balance with therapist providing proprioceptive input to LUE joints while pt performed standing grooming tasks with non-dom R hand. Min A to perform bilat tasks such as opening toothpaste,  applying to toothbrush, and applying deodorant to R underarm. Cues for compensatory strategies manipulate objects Upper Body Bathing: Maximal assistance Lower Body Bathing: Total assistance Upper Body Dressing : Total assistance Lower Body Dressing: Total assistance Toilet Transfer: Minimal assistance, Min guard Toilet Transfer Details (indicate cue type and reason): Min guard t/fs to the right to [place]; MinA wih t/fs back to the bed with increased time to advance the Left LE. Toileting- Clothing Manipulation and Hygiene: Minimal assistance Functional mobility during ADLs: Min guard, Minimal assistance General ADL Comments: Pt demo's improvement in deficits, requiring at least min-mod assist for ADL tasks  Cognition: Cognition Overall Cognitive Status: Within Functional Limits for tasks assessed Orientation Level: Oriented X4 Cognition Arousal/Alertness: Awake/alert Behavior During Therapy: Flat affect Overall Cognitive Status: Within Functional Limits for tasks assessed General Comments: responds when talked to but does not initiate conversation Difficult to assess due to: Impaired communication  Physical Exam: Blood pressure 106/64, pulse 71, temperature 98 F (36.7 C), temperature source Oral, resp. rate 18, height 5\' 5"  (1.651 m), weight 59 kg, last menstrual period 01/31/2019, SpO2 99 %. Physical Exam  Vitals reviewed. Constitutional: She appears well-developed.  Thin  HENT:  Head: Normocephalic and atraumatic.  Eyes: EOM are normal. Right eye exhibits no discharge. Left eye exhibits no discharge.  Neck: No tracheal deviation present. No thyromegaly present.  Respiratory: Effort normal. No respiratory distress.  GI: Soft. She exhibits no distension.  Musculoskeletal:     Comments: No edema or tenderness in extremities  Neurological: She is alert.  Follows commands.   Oriented x3.   Motor: RUE/RLE: 5/5 proximal to distal LUE: 0/5 proximal to distal LLE: HF, KE 2/5,  ADF 0/5 Emerging tone elbow flexors LUE  Skin: Skin is warm and dry.  Psychiatric: She has a normal mood and affect. Her behavior is normal.    No results found for this or any previous visit (from the past 48 hour(s)). No results found.     Medical Problem List and Plan: 1.  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  Antithrombotics: -DVT/anticoagulation: Subcutaneous Lovenox  -antiplatelet therapy: Aspirin 81 mg daily and Plavix 75 mg daily until 03/02/2019 and then aspirin alone 3. Pain Management: Tylenol as needed 4. Mood: Seroquel 25 mg every morning and 100 mg nightly, Prozac 25 mg daily.     Emotional support  -antipsychotic agents: N/A 5. Neuropsych: This patient is capable of making decisions on her own behalf. 6. Skin/Wound Care: Routine skin checks 7. Fluids/Electrolytes/Nutrition: Routine in and outs. CMP ordered for tomorrow AM. 8.  Polysubstance abuse as well as tobacco abuse.  Provide counseling 9.  Hyperlipidemia.  Continue Lipitor  Lavon Paganini Angiulli, PA-C 02/16/2019  I have personally performed a face to face diagnostic evaluation, including, but not limited  to relevant history and physical exam findings, of this patient and developed relevant assessment and plan.  Additionally, I have reviewed and concur with the physician assistant's documentation above.  Maryla Morrow, MD, ABPMR  The patient's status has not changed. The original post admission physician evaluation remains appropriate, and any changes from the pre-admission screening or documentation from the acute chart are noted above.   Maryla Morrow, MD, ABPMR

## 2019-02-16 NOTE — Progress Notes (Signed)
PMR Admission Coordinator Pre-Admission Assessment   Patient: Jane Watkins is an 24 y.o., female MRN: [id number] DOB: 05/24/1995 Height: _0  (165.1 cm) Weight: 59 kg                                                                                                                                                  Insurance Information HMO:     PPO: yes     PCP:      IPA:      80/20:      OTHER:  PRIMARY: Information systems manager (child policy)      Policy#: [id number]      Subscriber: [address] (father) CM Name: tbd      Phone#: [phone]     Fax#: [phone] Pre-Cert#: [id number]      Employer:  Benefits:  Phone #: [phone]    Name:  Eff. Date: 02/11/19     Deduct: $0      Out of Pocket Max: $3500 ($0 met)      Life Max: n/a CIR: 80%      SNF: 80% limit 100 days Outpatient: 80%     Co-Pay: 20%, limited to 60 visits combined PT/OT/SLP Home Health: 80%      Co-Pay: 20% limited to 180 visits (1 visit =4 hours) DME: 80%     Co-Pay: 20% Providers:  In network SECONDARY:       Policy#:       Subscriber:  CM Name:       Phone#:      Fax#:  Pre-Cert#:       Employer:  Benefits:  Phone #:      Name:  Eff. Date:      Deduct:       Out of Pocket Max:       Life Max:  CIR:       SNF:  Outpatient:      Co-Pay:  Home Health:       Co-Pay:  DME:      Co-Pay:    Medicaid Application Date:       Case Manager:  Disability Application Date:       Case Worker:    The "Data Collection Information Summary" for patients in Inpatient Rehabilitation Facilities with attached "[facility] Records" was provided and verbally reviewed with: N/A   Emergency Contact Information Contact Information       Name Relation Home Work Mobile    Lindsley,Carol Mother     [phone]    Skoglund,Paul "mike" Father     [phone]    [facility] Stepmother     [phone]         Current Medical History  Patient Admitting Diagnosis: 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  Patient on no prescription medications at time of admission.  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  She was discharged home with family ambulating extended distances with a rolling walker.  Presented 02/07/2019 after being found down and unresponsive.  EMS was contacted the patient did receive Narcan with some improvement in mental status.  Admission labs with WBC 20,700, potassium 5.2, BUN 25, creatinine 0.67, urine drug screen positive cocaine, marijuana and benzodiazepines, alcohol level less than 10 SARS coronavirus negative, blood cultures no growth to date.  Cranial CT scan showed areas of indistinct low-density in the right basal ganglia with mass-effect on the right lateral ventricle.  Patient did not receive TPA.  MRI showed acute infarction right MCA territory affecting the basal ganglia and affecting the cortical and subcortical brain in a largely watershed distribution.  CTA of head and neck showed no major arterial occlusion or significant stenosis.  Echocardiogram with ejection fraction of 60%.  Neurology follow-up currently maintained on aspirin and Plavix for CVA prophylaxis x3 weeks then aspirin alone with Plavix to be completed 03/02/2019.  Subcutaneous Lovenox for DVT prophylaxis.  Psychiatry consulted for depression and maintained on Seroquel as well as Prozac.  Tolerating a regular diet.  Therapy evaluations completed and patient was recommended for a comprehensive rehab program.       Complete NIHSS TOTAL: 6 Glasgow Coma Scale Score: (!) 20   Past Medical History      Past Medical History:  Diagnosis Date  . AKI (acute kidney injury) ([place]) 2018    "from overdose"  . Anxiety    . Daily headache    . Depression    . Drug overdose  04/2016    [name] 05/01/2016  . GERD (gastroesophageal reflux disease)      "when I was younger; gone now" (09/21/2017)  . Migraine      "q couple weeks" (09/21/2017)  . Overdose        Family History  family history includes Anemia in her father.   Prior Rehab/Hospitalizations:  Has the patient had prior rehab or hospitalizations prior to admission? Yes   Has the patient had major surgery during 100 days prior to admission? No   Current Medications    Current Facility-Administered Medications:  .   stroke: mapping our early stages of recovery book, , Does not apply, Once, Sreenath, Sudheer B, MD .  acetaminophen (TYLENOL) tablet 650 mg, 650 mg, Oral, Q4H PRN, 650 mg at 02/13/19 0228 **OR** acetaminophen (TYLENOL) 160 MG/5ML solution 650 mg, 650 mg, Per Tube, Q4H PRN **OR** acetaminophen (TYLENOL) suppository 650 mg, 650 mg, Rectal, Q4H PRN, Priscella Mann, Sudheer B, MD, 650 mg at 02/08/19 1702 .  aspirin chewable tablet 81 mg, 81 mg, Oral, Daily, Samuella Cota, MD, 81 mg at 02/15/19 0854 .  atorvastatin (LIPITOR) tablet 80 mg, 80 mg, Oral, q1800, Samuella Cota, MD, 80 mg at 02/15/19 1825 .  clopidogrel (PLAVIX) tablet 75 mg, 75 mg, Oral, Daily, Samuella Cota, MD, 75 mg at 02/15/19 0854 .  enoxaparin (LOVENOX) injection 40 mg, 40 mg, Subcutaneous, Q24H, Samuella Cota, MD, 40 mg at 02/12/19 1042 .  feeding supplement (ENSURE ENLIVE) (ENSURE ENLIVE) liquid 237 mL, 237 mL, Oral, BID BM, Thornell Mule, MD .  FLUoxetine (PROZAC) capsule 20 mg, 20 mg, Oral, Daily, Samuella Cota, MD, 20 mg at 02/15/19 0853 .  hydrOXYzine (ATARAX/VISTARIL) tablet 25 mg, 25 mg, Oral, TID PRN, Samuella Cota, MD, 25 mg at 02/14/19 2041 .  multivitamin with minerals tablet 1 tablet, 1 tablet, Oral, Daily, Jennye Boroughs, MD, 1 tablet at 02/15/19 0854 .  naloxone (NARCAN) injection 0.4 mg, 0.4 mg, Intravenous, PRN, Sreenath, Sudheer B, MD .  nicotine (NICODERM CQ - dosed in mg/24 hours) patch  21 mg, 21 mg, Transdermal, Daily, Samuella Cota, MD, 21 mg at 02/15/19 1825 .  pantoprazole (PROTONIX) EC tablet 20 mg, 20 mg, Oral, Daily, Thornell Mule, MD, 20 mg at 02/15/19 1838 .  QUEtiapine (SEROQUEL) tablet 100 mg, 100 mg, Oral, QHS, Sreenath, Sudheer B, MD, 100 mg at 02/15/19 2101 .  QUEtiapine (SEROQUEL) tablet 25 mg, 25 mg, Oral, q morning - 10a, Samuella Cota, MD, 25 mg at 02/15/19 0854 .  senna-docusate (Senokot-S) tablet 1 tablet, 1 tablet, Oral, QHS PRN, Priscella Mann, Sudheer B, MD .  zolpidem (AMBIEN) tablet 5 mg, 5 mg, Oral, Once, Sharion Settler, NP   Patients Current Diet:  Diet Order                  Diet regular Room service appropriate? Yes; Fluid consistency: Thin  Diet effective now                      Precautions / Restrictions Precautions Precautions: Fall Precaution Comments: Seizure precautions; aspiration precautions Restrictions Weight Bearing Restrictions: No    Has the patient had 2 or more falls or a fall with injury in the past year?No   Prior Activity Level Community (5-7x/wk): driving, working at [employer]   Prior Functional Level Prior Function Level of Independence: Independent Comments: Per boyfriend/chart, pt indep and working   Self Care: Did the patient need help bathing, dressing, using the toilet or eating?  Independent   Indoor Mobility: Did the patient need assistance with walking from room to room (with or without device)? Independent   Stairs: Did the patient need assistance with internal or external stairs (with or without device)? Independent   Functional Cognition: Did the patient need help planning regular tasks such as shopping or remembering to take medications? Independent   Home Assistive Devices / Equipment Home Assistive Devices/Equipment: None Home Equipment: None   Prior Device Use: Indicate devices/aids used by the patient prior to current illness, exacerbation or injury? None of the above    Current Functional Level Cognition   Overall Cognitive Status: Within Functional Limits for tasks assessed Difficult to assess due to: Impaired communication Orientation Level: Oriented X4 General Comments: responds when talked to but does not initiate conversation    Extremity Assessment (includes Sensation/Coordination)   Upper Extremity Assessment: Difficult to assess due to impaired cognition, RUE deficits/detail, LUE deficits/detail RUE Deficits / Details: fair R hand grip strength; at least 3/5 AROM R elbow flexion/extension, shoulder flexion, and shoulder shrug LUE Deficits / Details: no movement initiation noted on L UE, likely sensory impairment, does not respond to noxious stimuli on L side (although nurse tech reports pt did respond when blood sugar taken on L index finger earlier) LUE Sensation: decreased light touch, decreased proprioception LUE Coordination: decreased fine motor, decreased gross motor  Lower Extremity Assessment: RLE deficits/detail, LLE deficits/detail, Difficult to assess due to impaired cognition RLE Deficits / Details: at least 3/5 hip flexion, knee flexion/extension, and DF/PF AROM LLE Deficits / Details: no initiation of movement noted L LE; increased tone noted intermittently with L LE with movement/mobility, +clonus  in ankle LLE Sensation: decreased light touch, decreased proprioception LLE Coordination: decreased fine motor, decreased gross motor     ADLs   Overall ADL's : Needs assistance/impaired Eating/Feeding: Set up, Independent Eating/Feeding Details (indicate cue type and reason): Limited endurance for self-feeding  applesauce using her nondominant RUE. Pt. is able to perform hand to mouth patterns with the utensil, and scooping complete set-up is provided, and stabilized in front of her. Grooming: Set up, Min guard, Minimal assistance Grooming Details (indicate cue type and reason): Pt set up with grooming supplies at tray table, Min A to  stand EOB, CGA to maintain standing balance with therapist providing proprioceptive input to LUE joints while pt performed standing grooming tasks with non-dom R hand. Min A to perform bilat tasks such as opening toothpaste, applying to toothbrush, and applying deodorant to R underarm. Cues for compensatory strategies manipulate objects Upper Body Bathing: Maximal assistance Lower Body Bathing: Total assistance Upper Body Dressing : Total assistance Lower Body Dressing: Total assistance Toilet Transfer: Minimal assistance, Min guard Toilet Transfer Details (indicate cue type and reason): Min guard t/fs to the right to [place]; MinA wih t/fs back to the bed with increased time to advance the Left LE. Toileting- Clothing Manipulation and Hygiene: Minimal assistance Functional mobility during ADLs: Min guard, Minimal assistance General ADL Comments: Pt demo's improvement in deficits, requiring at least min-mod assist for ADL tasks     Mobility   Overal bed mobility: Needs Assistance Bed Mobility: Supine to Sit Supine to sit: Supervision, HOB elevated Sit to supine: Min assist General bed mobility comments: Pt. initiated hooking her LLE under her right to slide over tot he EOB.     Transfers   Overall transfer level: Needs assistance Equipment used: Hemi-walker Transfers: Sit to/from Stand Sit to Stand: Min guard, Min assist Stand pivot transfers: Min guard, Min assist([place] to the right to [place], MinA to the Left to bed.) General transfer comment: vc's for positioning to stand and also prior to sitting; posterior lean noted standing from bed initially (pt pushing B LE's against bed to stabilize)     Ambulation / Gait / Stairs / Wheelchair Mobility   Ambulation/Gait Ambulation/Gait assistance: Herbalist (Feet): 130 Feet Assistive device: Hemi-walker Gait Pattern/deviations: Step-to pattern, Decreased step length - right, Decreased stance time - left General Gait  Details: slow gait with narrow BOS with decreased step length and height,  While no outright LOB with foward gait, she requires +1 assist at all times for balance and safety. Gait velocity: decreased     Posture / Balance Dynamic Sitting Balance Sitting balance - Comments: steady sitting reaching within BOS Balance Overall balance assessment: Needs assistance Sitting-balance support: No upper extremity supported Sitting balance-Leahy Scale: Good Sitting balance - Comments: steady sitting reaching within BOS Standing balance support: Single extremity supported, During functional activity Standing balance-Leahy Scale: Poor Standing balance comment: requires at least single UE support for static standing balance     Special needs/care consideration BiPAP/CPAP no CPM no Continuous Drip IV no Dialysis no        Days n/a Life Vest no Oxygen no Special Bed no Trach Size no Wound Vac (area) no      Location n/a Skin blisters to neck/chest                        Bowel mgmt: no Bladder mgmt: no Diabetic mgmt no Behavioral consideration hx of polysubstance abuse/OD Chemo/radiation  Previous Home Environment (from acute therapy documentation) Living Arrangements: (boyfriend) Type of Home: Apartment Home Layout: One level Home Access: Level entry Bathroom Shower/Tub: Chiropodist: [place]: No Additional Comments: boyfriend present, provided home set up information, as pt unable to verbalize   Discharge Living Setting Plans for Discharge Living Setting: Patient's home, Apartment(lives with boyfriend, [name]) Type of Home at Discharge: Apartment Discharge Home Layout: One level Discharge Home Access: Level entry Discharge Bathroom Shower/Tub: Tub/shower unit Discharge Bathroom Toilet: Standard Discharge Bathroom Accessibility: Yes How Accessible: Accessible via walker Does the patient have any problems obtaining your medications?: No    Social/Family/Support Systems Anticipated Caregiver: [name] (BF), Viann Fish (Mom), Dario Ave (dad) Anticipated Caregiver's Contact Information: [name] [phone], Arbie Cookey [phone], Eddie Dibbles [phone] (works 7a-7p) Ability/Limitations of Caregiver: [name] will be able to take some time off of work, and when he has to work he said he will drop [name] off at her mothers.  Arbie Cookey is okay being backup for 24/7 as well.  Caregiver Availability: 24/7 Discharge Plan Discussed with Primary Caregiver: Yes Is Caregiver In Agreement with Plan?: Yes Does Caregiver/Family have Issues with Lodging/Transportation while Pt is in Rehab?: No     Goals/Additional Needs Patient/Family Goal for Rehab: PT/OT supervision to mod I Expected length of stay: 4-7 days Dietary Needs: reg/thin Equipment Needs: tbd Additional Information: Pt with history of polysubstance abuse and OD (here for same in 2018 with Dr. [name]).  Pt/Family Agrees to Admission and willing to participate: Yes Program Orientation Provided & Reviewed with Pt/Caregiver Including Roles  & Responsibilities: Yes     Decrease burden of Care through IP rehab admission: n/a   Possible need for SNF placement upon discharge: not anticipated    Patient Condition: This patient's medical and functional status has changed since the consult dated: 02/11/19 in which the Rehabilitation Physician determined and documented that the patient's condition is appropriate for intensive rehabilitative care in an inpatient rehabilitation facility. See "History of Present Illness" (above) for medical update. Functional changes are: min assist x130'. Patient's medical and functional status update has been discussed with the Rehabilitation physician and patient remains appropriate for inpatient rehabilitation. Will admit to inpatient rehab today.   Preadmission Screen Completed By:  Michel Santee, PT, DPT 02/16/2019 10:20  AM ______________________________________________________________________   Discussed status with Dr. Posey Pronto on 02/16/19 at 10:45 AM and received approval for admission today.   Admission Coordinator:  Michel Santee, PT, DPT time 10:45 AM Sudie Grumbling 02/16/19

## 2019-02-16 NOTE — Evaluation (Signed)
Occupational Therapy Assessment and Plan  Patient Details  Name: Jane Watkins MRN: [id number] Date of Birth: 08/28/1995  OT Diagnosis: hemiplegia affecting dominant side and muscle weakness (generalized) Rehab Potential: Rehab Potential (ACUTE ONLY): Excellent ELOS: 7-10 days   Today's Date: 02/17/2019 OT Individual Time: 1100-1200 OT Individual Time Calculation (min): 60 min     Problem List:  Patient Active Problem List   Diagnosis Date Noted  . 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 Polysubstance abuse ([place])   . Dyslipidemia   . Ischemic stroke diagnosed during current admission ([place]) 02/13/2019  . MDD (major depressive disorder), recurrent episode, moderate ([place]) 02/10/2019  . Dysphagia 02/09/2019  . Elevated CK 02/09/2019  . CVA (cerebral vascular accident) ([place]) 02/07/2019  . Cellulitis of arm 09/20/2017  . IV drug abuse ([place]) 09/20/2017  . Quadriceps weakness 06/18/2016  . TBI (traumatic brain injury) ([place]) 05/01/2016  . 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 Respiratory distress   . Glasgow coma scale total score 3-8 ([place])   . Pneumonia of both lower lobes due to methicillin resistant Staphylococcus aureus (MRSA) ([place])   . Acute pulmonary edema ([facility])   . Non-traumatic rhabdomyolysis   . Opioid abuse ([place])   . Elevated troponin   . Acute respiratory failure with hypoxia ([place])   . Drug overdose   . Elevated liver enzymes   . Encephalopathy acute   . Encephalopathy 04/19/2016  . Opiate overdose ([place]) 04/19/2016    Past Medical History:  Past Medical History:  Diagnosis Date  . AKI (acute kidney injury) ([place]) 2018   "from overdose"  . Anxiety   . Daily headache   . Depression   . Drug overdose 04/2016   [name] 05/01/2016  . GERD (gastroesophageal reflux disease)    "when I was younger; gone now" (09/21/2017)  . Migraine    "q couple weeks" (09/21/2017)  . Overdose    Past Surgical History:  Past Surgical History:  Procedure Laterality  Date  . APPENDECTOMY  04/2013  . I & D EXTREMITY Left 09/20/2017   Procedure: IRRIGATION AND DEBRIDEMENT LEFT ARM;  Surgeon: [name], MD;  Location: [facility];  Service: Orthopedics;  Laterality: Left;  . TEE WITHOUT CARDIOVERSION N/A 02/10/2019   Procedure: TRANSESOPHAGEAL ECHOCARDIOGRAM (TEE);  Surgeon: [name], MD;  Location: [facility];  Service: Cardiovascular;  Laterality: N/A;  Polysubstance abuser  . TONGUE SURGERY  ~ 2007   "related to lisp"  . TRACHEOSTOMY  04/2016   [name] 05/01/2016    Assessment & Plan Clinical Impression: Patient is a 24 y.o. year old female with recent admission to the hospital onleft-handed female with history of IV drug use, polysubstance/tobacco abuse, anxiety. History taken from chart review, patient, and boyfriend. Patient on no prescription medications at time of admission. Patient lives with her boyfriend. She reportedly works at [employer]. Independent prior to admission. 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 She was discharged home with family ambulating extended distances with a rolling walker. Presented 02/07/2019 after being found unresponsive. EMS was contacted the patient received Narcan with some improvement in mental status. Admission labs with WBC 20,700, potassium 5.2, BUN 25, creatinine 0.67, urine drug screen positive cocaine, marijuana and benzodiazepines, alcohol level less than 10, SARS coronavirus negative, blood cultures no growth to date. Cranial CT scan showed areas of indistinct low-density in the right basal ganglia with mass-effect on the right lateral ventricle. Patient did not  receive TPA. 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 CTA of head and neck showed no major arterial occlusion or  significant stenosis. Echocardiogram with EF of 60%. Neurology follow-up currently maintained on aspirin and Plavix for CVA prophylaxis x3 weeks then aspirin alone with Plavix to be completed 03/02/2019. Subcutaneous Lovenox for DVT prophylaxis. Psychiatry consulted for depression and maintained on Seroquel as well as Prozaac. Tolerating a regular diet.  Patient transferred to CIR on 02/16/2019 .    Patient currently requires mod with basic self-care skills secondary to muscle weakness, abnormal tone, unbalanced muscle activation and decreased coordination and decreased standing balance, decreased postural control, hemiplegia and decreased balance strategies.  Prior to hospitalization, patient could complete BADL with independent .  Patient will benefit from skilled intervention to increase independence with basic self-care skills prior to discharge home with care partner.  Anticipate patient will require 24 hour supervision and follow up outpatient.  OT - End of Session Endurance Deficit: Yes Endurance Deficit Description: rest breaks within BADL tasks OT Assessment Rehab Potential (ACUTE ONLY): Excellent OT Patient demonstrates impairments in the following area(s): Balance;Endurance;Motor;Safety OT Basic ADL's Functional Problem(s): Eating;Grooming;Dressing;Bathing;Toileting OT Transfers Functional Problem(s): Toilet;Tub/Shower OT Additional Impairment(s): Fuctional Use of Upper Extremity OT Plan OT Intensity: Minimum of 1-2 x/day, 45 to 90 minutes OT Frequency: 5 out of 7 days OT Duration/Estimated Length of Stay: 7-10 days OT Treatment/Interventions: Balance/vestibular training;Community reintegration;Discharge planning;Disease mangement/prevention;DME/adaptive equipment instruction;Functional electrical stimulation;Functional mobility training;Neuromuscular re-education;Pain management;Patient/family education;Psychosocial support;Self Care/advanced ADL retraining;Splinting/orthotics;Therapeutic  Activities;Therapeutic Exercise;UE/LE Strength taining/ROM;UE/LE Coordination activities;Wheelchair propulsion/positioning OT Self Feeding Anticipated Outcome(s): Mod I OT Basic Self-Care Anticipated Outcome(s): Supervision OT Toileting Anticipated Outcome(s): supervision OT Bathroom Transfers Anticipated Outcome(s): supervision OT Recommendation Recommendations for Other Services: Neuropsych consult;Therapeutic Recreation consult Therapeutic Recreation Interventions: Stress management;Outing/community reintergration Patient destination: Home Follow Up Recommendations: Outpatient OT Equipment Recommended: To be determined   Skilled Therapeutic Intervention Initial eval completed with treatment provided to address functional transfers, functional use of L UE, improved sit<>stand, standing tolerance, and adapted bathing/dressing skills. Pt greeted sitting in wc and agreeable to shower. Pt ambulated with min/mod HHA into bathroom and onto shower chair. Incorporated L NMR weight bearing techniques within bathing tasks with hand over hand A. Min A for balance when reaching to wash buttocks. LB/UB dressing completed wc level at the sink with assistance to thread L LE into clothing and assist to pull up pants. UB/LB dressing with mod A overall 2/2 L hemiplegia. Incorporate weight bearing through L UE when standing at the sink. Pt needed OT assist to comb, dry, and braid hair. Pt completed stand-pivot to recliner with min A.   OT placed SAEBO e-stim system on L UE during lunch and returned to remove e-stim after 60 minutes. Pt reported no adverse reactions.   OT Evaluation Precautions/Restrictions  Precautions Precautions: Fall Precaution Comments: L hemi Restrictions Weight Bearing Restrictions: No Pain  pt reports indigestion, no number given. Nursing notified of discomfort.  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[employer] Leisure: Hobbies-yes (Comment) Comments: Enjoys  traveling ADL ADL Eating: Set up Grooming: Minimal assistance Upper Body Bathing: Moderate assistance Lower Body Bathing: Moderate assistance Upper Body Dressing: Moderate assistance Lower Body Dressing: Maximal assistance Toileting: Minimal assistance Toilet Transfer: Moderate assistance Tub/Shower Transfer: Moderate assistance Vision Baseline Vision/History: No visual deficits Cognition Overall Cognitive Status: Within Functional Limits for tasks assessed Arousal/Alertness: Awake/alert Orientation Level: Place;Person;Situation Person: Oriented Place: Oriented Situation: Oriented Year: 2021 Month: January Day of Week: Correct Memory: Appears intact Immediate Memory Recall: Sock;Blue;Bed Memory Recall Sock: Without Cue Memory Recall Blue: Without Cue Memory Recall Bed: With Cue Awareness: Appears intact Problem Solving: Appears intact Comments: flat affect Sensation Sensation Light Touch: Appears Intact Coordination Gross Motor Movements are Fluid and Coordinated: No Fine Motor Movements are Fluid and Coordinated: No Coordination and Movement Description: L UE flaccid hemiplegia Motor  Motor Motor: Hemiplegia;Abnormal tone;Abnormal postural alignment and control Motor - Skilled Clinical Observations: L hemi Mobility  Bed Mobility Bed Mobility: Rolling Right;Rolling Left;Supine to Sit Rolling Right: Supervision/verbal cueing Rolling Left: Supervision/Verbal cueing Supine to  Sit: Minimal Assistance - Patient > 75% Transfers Sit to Stand: Minimal Assistance - Patient > 75% Stand to Sit: Minimal Assistance - Patient > 75%  Trunk/Postural Assessment  Cervical Assessment Cervical Assessment: Within Functional Limits Thoracic Assessment Thoracic Assessment: Within Functional Limits Lumbar Assessment Lumbar Assessment: Within Functional Limits Postural Control Postural Control: Deficits on evaluation Protective Responses: delayed in adequate  Balance Balance Balance Assessed: Yes Static Sitting Balance Static Sitting - Level of Assistance: 5: Stand by assistance Dynamic Sitting Balance Dynamic Sitting - Level of Assistance: 4: Min assist Static Standing Balance Static Standing - Level of Assistance: 4: Min assist Dynamic Standing Balance Dynamic Standing - Level of Assistance: 3: Mod assist Extremity/Trunk Assessment RUE Assessment RUE Assessment: Within Functional Limits LUE Assessment LUE Assessment: Exceptions to [facility] LUE Body System: Neuro Brunstrum levels for arm and hand: Arm;Hand Brunstrum level for arm: Stage I Presynergy Brunstrum level for hand: Stage I Flaccidity LUE Tone LUE Tone: Flaccid     Refer to Care Plan for Long Term Goals  Recommendations for other services: Neuropsych and Therapeutic Recreation  Stress management and Outing/community reintegration   Discharge Criteria: Patient will be discharged from OT if patient refuses treatment 3 consecutive times without medical reason, if treatment goals not met, if there is a change in medical status, if patient makes no progress towards goals or if patient is discharged from hospital.  The above assessment, treatment plan, treatment alternatives and goals were discussed and mutually agreed upon: by patient  Valma Cava 02/17/2019, 3:10 PM

## 2019-02-16 NOTE — TOC Transition Note (Signed)
Transition of Care [facility]) - CM/SW Discharge Note   Patient Details  Name: Sagan Wurzel MRN: [id number] Date of Birth: 04/02/1995  [facility] CM/SW Contact:  Margarito Liner, LCSW Phone Number: 02/16/2019, 11:42 AM   Clinical Narrative:  Patient has insurance approval and discharge orders to go to [facility] today. She will transport by EMS. RN will call report to [phone] (Room [phone]. Accepting physician is Maryla Morrow). No further concerns. CSW signing off.   Final next level of care: IP Rehab Facility Barriers to Discharge: Barriers Resolved   Patient Goals and CMS Choice Patient states their goals for this hospitalization and ongoing recovery are:: patient is unable to verbalize CMS Medicare.gov Compare Post Acute Care list provided to:: Patient Represenative (must comment)(father Renae Fickle) Choice offered to / list presented to : Parent  Discharge Placement                Patient to be transferred to facility by: EMS   Patient and family notified of of transfer: 02/16/19  Discharge Plan and Services   Discharge Planning Services: CM Consult Post Acute Care Choice: IP Rehab                               Social Determinants of Health (SDOH) Interventions     Readmission Risk Interventions No flowsheet data found.

## 2019-02-16 NOTE — Progress Notes (Signed)
Pt cont to wait for ems for transport/  Ems called to see what the holdup is and they reported emergencies and could not give a time  For pickup.

## 2019-02-16 NOTE — Progress Notes (Signed)
Called cir  To give report on pt earlier and  Was told nurse would call me back to get report.  Ronny Bacon RN called at this time from cir and report given on pt.  Sl d/cd earlier/  Ems called for transport  And pt ready to go to CIR

## 2019-02-16 NOTE — Progress Notes (Signed)
Pt arrived to unit via EMS, pt is alert and able to make needs known, boyfriend at bedside and both oriented to rehab.

## 2019-02-17 ENCOUNTER — Inpatient Hospital Stay (HOSPITAL_COMMUNITY): Payer: 59 | Admitting: Occupational Therapy

## 2019-02-17 ENCOUNTER — Inpatient Hospital Stay (HOSPITAL_COMMUNITY): Payer: 59

## 2019-02-17 DIAGNOSIS — F1910 Other psychoactive substance abuse, uncomplicated: Secondary | ICD-10-CM

## 2019-02-17 DIAGNOSIS — I63511 Cerebral infarction due to unspecified occlusion or stenosis of right middle cerebral artery: Secondary | ICD-10-CM

## 2019-02-17 LAB — CBC WITH DIFFERENTIAL/PLATELET
Abs Immature Granulocytes: 0.03 10*3/uL (ref 0.00–0.07)
Basophils Absolute: 0 10*3/uL (ref 0.0–0.1)
Basophils Relative: 0 %
Eosinophils Absolute: 0.1 10*3/uL (ref 0.0–0.5)
Eosinophils Relative: 1 %
HCT: 43.6 % (ref 36.0–46.0)
Hemoglobin: 14.5 g/dL (ref 12.0–15.0)
Immature Granulocytes: 0 %
Lymphocytes Relative: 42 %
Lymphs Abs: 3.7 10*3/uL (ref 0.7–4.0)
MCH: 29.1 pg (ref 26.0–34.0)
MCHC: 33.3 g/dL (ref 30.0–36.0)
MCV: 87.4 fL (ref 80.0–100.0)
Monocytes Absolute: 0.7 10*3/uL (ref 0.1–1.0)
Monocytes Relative: 8 %
Neutro Abs: 4.3 10*3/uL (ref 1.7–7.7)
Neutrophils Relative %: 49 %
Platelets: 353 10*3/uL (ref 150–400)
RBC: 4.99 MIL/uL (ref 3.87–5.11)
RDW: 16 % — ABNORMAL HIGH (ref 11.5–15.5)
WBC: 9 10*3/uL (ref 4.0–10.5)
nRBC: 0 % (ref 0.0–0.2)

## 2019-02-17 LAB — COMPREHENSIVE METABOLIC PANEL
ALT: 49 U/L — ABNORMAL HIGH (ref 0–44)
AST: 30 U/L (ref 15–41)
Albumin: 4.1 g/dL (ref 3.5–5.0)
Alkaline Phosphatase: 52 U/L (ref 38–126)
Anion gap: 9 (ref 5–15)
BUN: 26 mg/dL — ABNORMAL HIGH (ref 6–20)
CO2: 22 mmol/L (ref 22–32)
Calcium: 9.4 mg/dL (ref 8.9–10.3)
Chloride: 110 mmol/L (ref 98–111)
Creatinine, Ser: 0.69 mg/dL (ref 0.44–1.00)
GFR calc Af Amer: >60 mL/min (ref >60)
GFR calc non Af Amer: >60 mL/min (ref >60)
Glucose, Bld: 90 mg/dL (ref 70–99)
Potassium: 4.2 mmol/L (ref 3.5–5.1)
Sodium: 141 mmol/L (ref 135–145)
Total Bilirubin: 1.3 mg/dL — ABNORMAL HIGH (ref 0.3–1.2)
Total Protein: 7 g/dL (ref 6.5–8.1)

## 2019-02-17 MED ORDER — ACETAMINOPHEN 325 MG PO TABS
325.0000 mg | ORAL_TABLET | Freq: Once | ORAL | Status: AC
  Administered 2019-02-17: 22:00:00 325 mg via ORAL

## 2019-02-17 NOTE — Plan of Care (Signed)
  Problem: Consults Goal: RH STROKE PATIENT EDUCATION Description: See Patient Education module for education specifics  Outcome: Progressing   Problem: RH BOWEL ELIMINATION Goal: RH STG MANAGE BOWEL WITH ASSISTANCE Description: STG Manage Bowel with min Assistance. Outcome: Progressing Goal: RH STG MANAGE BOWEL W/MEDICATION W/ASSISTANCE Description: STG Manage Bowel with Medication with min Assistance. Outcome: Progressing   Problem: RH BLADDER ELIMINATION Goal: RH STG MANAGE BLADDER WITH ASSISTANCE Description: STG Manage Bladder With min Assistance Outcome: Progressing Goal: RH STG MANAGE BLADDER WITH EQUIPMENT WITH ASSISTANCE Description: STG Manage Bladder With Equipment With min Assistance Outcome: Progressing   Problem: RH SKIN INTEGRITY Goal: RH STG SKIN FREE OF INFECTION/BREAKDOWN Description: Skin will be free of infection/breakdown with min assistance Outcome: Progressing Goal: RH STG MAINTAIN SKIN INTEGRITY WITH ASSISTANCE Description: STG Maintain Skin Integrity With min Assistance. Outcome: Progressing   Problem: RH SAFETY Goal: RH STG ADHERE TO SAFETY PRECAUTIONS W/ASSISTANCE/DEVICE Description: STG Adhere to Safety Precautions With min Assistance/Device. Outcome: Progressing   Problem: RH COGNITION-NURSING Goal: RH STG ANTICIPATES NEEDS/CALLS FOR ASSIST W/ASSIST/CUES Description: STG Anticipates Needs/Calls for Assist With min Assistance/Cues. Outcome: Progressing   Problem: RH PAIN MANAGEMENT Goal: RH STG PAIN MANAGED AT OR BELOW PT'S PAIN GOAL Description: Less than 2 out of 10 Outcome: Progressing   Problem: RH KNOWLEDGE DEFICIT Goal: RH STG INCREASE KNOWLEDGE OF STROKE PROPHYLAXIS Description: Pt will be able to name stroke prophylaxis  Outcome: Progressing   

## 2019-02-17 NOTE — Progress Notes (Signed)
Physical Therapy Session Note  Patient Details  Name: Jane Watkins MRN: [id number] Date of Birth: 1995-12-03  Today's Date: 02/17/2019 PT Individual Time: 1415-1530 PT Individual Time Calculation (min): 75 min   Short Term Goals: Week 1:  PT Short Term Goal 1 (Week 1): = LTGs overall supervision Week 2:    Week 3:     Skilled Therapeutic Interventions/Progress Updates:      Therapy Documentation Precautions:  Precautions Precautions: Fall Precaution Comments: L hemi Restrictions Weight Bearing Restrictions: No PAIN Denies pain this pm  PT session this pm focusing on L ankle instability, balance, wt shifting, and increased awareness/use of LLE w/transitional movements.  Pt initially sleeping soundly in recliner.  Slow to arouse but fully awake after being brought to upright.  Static sit on edge of chair w/supervision.  STS from recliner to RW w/min assist.  Gait 23ft w/RW and tactile cues/manual facilitation of [place] to prevent rolling of ankle which is a signficant issue.  Decreased clearance LLE thru swing, tends to catch toe mid swing due to absent DF which progresses to inversion if not stabilized by therapist.  Attempted to utilize AFO, but available shoes with mouth type opening vs tongue and opening too small for brace + foot.  Also attempted gait w/acewrap DF/EVE assist but inadequate control achieved. Best results w/taping of toe of shoe as "shoe cap" to decrease floor/shoe resistance.   Attempted taping ankle, but again without successful control achieved.   Gait trials w/various ankle strategies: 148ft, 42ft, 231ft x 2, min assist required primarily for stabilization at ankle, management of RW due to LUE paresis.  Last gait effort included return to room at end of session.  Inversion tendency increased at initiation of each gait trial and w/fatigue.  Dynamic balance w/acewrap [place]:   Standing tapping 4in step first w/LLE w/poor control x 8reps                      RLE w/verbal and tactile cueing for glut and quad activation x 15, initially w/mod assist progressing to min assist STS w/isometric resistance at L hip to promote wt shifting to L and verbal /tactile cues to encourage wt shift L/midline w/transition.  Repeated 12-15x.   Educated pt purpose of increasing/optimising use of LLE to facilitate recovery.  Commode transfer w/verbal cues and min assist w/RW.  Pt able to urinate and perform hygiene independently.  Pt did attempt STS without assist and reminded pt/boyfriend that pt must have assistance to get up/to BR, etc at all times.  Educated re falls risk.   Short distance gait approx 64ft commode to bed w/rw and min assist as above.  Transfers to bed w/min assist.  Dynamic sitting balance on edbe of bed/pt w/tendency to lose balance to L w/challenges w/absent LUE strategies/delayed LLE.    Requested boyfriend bring in alternate shoes which would be more conducive to use of AFO.  Also discussed possibility of training w/assisting pt in room w/commode transfers once bracing is possible/appropriate shoes obtained.    Pt left supine w/rails up x 3, alarm set, bed in lowest position, and needs in reach.   Therapy/Group: Individual Therapy Rada Hay, PT  Shearon Balo 02/17/2019, 4:17 PM

## 2019-02-17 NOTE — Progress Notes (Signed)
[facility] PHYSICAL MEDICINE & REHABILITATION PROGRESS NOTE   Subjective/Complaints: Had a fair night. Sleeping when I entered  Offered little on ROS   Objective:   No results found. Recent Labs    02/16/19 1658 02/17/19 0505  WBC 8.6 9.0  HGB 15.5* 14.5  HCT 47.5* 43.6  PLT 385 353   Recent Labs    02/16/19 1658 02/17/19 0505  NA  --  141  K  --  4.2  CL  --  110  CO2  --  22  GLUCOSE  --  90  BUN  --  26*  CREATININE 0.59 0.69  CALCIUM  --  9.4    Intake/Output Summary (Last 24 hours) at 02/17/2019 1005 Last data filed at 02/16/2019 1845 Gross per 24 hour  Intake 220 ml  Output --  Net 220 ml     Physical Exam: Vital Signs Blood pressure (!) 98/54, pulse 67, temperature 97.7 F (36.5 C), resp. rate 16, last menstrual period 01/31/2019, SpO2 98 %. Constitutional: No distress . Vital signs reviewed. Frail appearing HEENT: EOMI, oral membranes moist Neck: supple Cardiovascular: RRR without murmur. No JVD    Respiratory: CTA Bilaterally without wheezes or rales. Normal effort    GI: BS +, non-tender, non-distended  Musculoskeletal:     Comments: No edema or tenderness in extremities  Neurological: She is alert.  Follows basic commands.   Oriented x3.   Motor: RUE/RLE: 5/5 proximal to distal LUE: 0/5 proximal to distal LLE: HF, KE 2/5, ADF 0/5---stable Emerging tone elbow flexors LUE  Skin: Skin is warm and dry.  Psychiatric: flat    Assessment/Plan: 1. Functional deficits secondary to right MCA infarction which require 3+ hours per day of interdisciplinary therapy in a comprehensive inpatient rehab setting.  Physiatrist is providing close team supervision and 24 hour management of active medical problems listed below.  Physiatrist and rehab team continue to assess barriers to discharge/monitor patient progress toward functional and medical goals  Care Tool:  Bathing              Bathing assist       Upper Body Dressing/Undressing Upper  body dressing        Upper body assist      Lower Body Dressing/Undressing Lower body dressing      What is the patient wearing?: Pants(sweat pants)     Lower body assist Assist for lower body dressing: Moderate Assistance - Patient 50 - 74%     Toileting Toileting    Toileting assist Assist for toileting: Moderate Assistance - Patient 50 - 74%     Transfers Chair/bed transfer  Transfers assist     Chair/bed transfer assist level: Minimal Assistance - Patient > 75%     Locomotion Ambulation   Ambulation assist              Walk 10 feet activity   Assist           Walk 50 feet activity   Assist           Walk 150 feet activity   Assist           Walk 10 feet on uneven surface  activity   Assist           Wheelchair     Assist               Wheelchair 50 feet with 2 turns activity    Assist  Wheelchair 150 feet activity     Assist          Blood pressure (!) 98/54, pulse 67, temperature 97.7 F (36.5 C), resp. rate 16, last menstrual period 01/31/2019, SpO2 98 %.  Medical Problem List and Plan: 1.  Left side weakness secondary to right MCA infarction as well as history of hypoxic encephalopathy after drug overdose 2018 requiring tracheostomy and received inpatient rehab services             -patient may may shower             -ELOS/Goals: 4-7 days/Mod I/Supervision             Patient is beginning CIR therapies today including PT and OT  2.  Antithrombotics: -DVT/anticoagulation: Subcutaneous Lovenox             -antiplatelet therapy: Aspirin 81 mg daily and Plavix 75 mg daily until 03/02/2019 and then aspirin alone 3. Pain Management: Tylenol as needed 4. Mood: Seroquel 25 mg every morning and 100 mg nightly, Prozac 25 mg daily.                           Emotional support             -antipsychotic agents: N/A 5. Neuropsych: This patient is capable of making decisions on her own  behalf. 6. Skin/Wound Care: Routine skin checks 7. Fluids/Electrolytes/Nutrition: encourage PO  -BUN sl elevated---encourage fluids.  -I personally reviewed the patient's labs today.   8.  Polysubstance abuse as well as tobacco abuse.  Provide counseling  -will need intensive outpt follow up given history 9.  Hyperlipidemia.  Continue Lipitor    LOS: 1 days A FACE TO FACE EVALUATION WAS PERFORMED  Ranelle Oyster 02/17/2019, 10:05 AM

## 2019-02-17 NOTE — Progress Notes (Signed)
Inpatient Rehabilitation  Patient information reviewed and entered into eRehab system by Alechia Lezama M. Gilmore List, M.A., CCC/SLP, PPS Coordinator.  Information including medical coding, functional ability and quality indicators will be reviewed and updated through discharge.    

## 2019-02-17 NOTE — Plan of Care (Signed)
  Problem: RH Balance Goal: LTG Patient will maintain dynamic standing with ADLs (OT) Description: LTG:  Patient will maintain dynamic standing balance with assist during activities of daily living (OT)  Flowsheets (Taken 02/17/2019 1359) LTG: Pt will maintain dynamic standing balance during ADLs with: Supervision/Verbal cueing   Problem: Sit to Stand Goal: LTG:  Patient will perform sit to stand in prep for activites of daily living with assistance level (OT) Description: LTG:  Patient will perform sit to stand in prep for activites of daily living with assistance level (OT) Flowsheets (Taken 02/17/2019 1359) LTG: PT will perform sit to stand in prep for activites of daily living with assistance level: Supervision/Verbal cueing   Problem: RH Eating Goal: LTG Patient will perform eating w/assist, cues/equip (OT) Description: LTG: Patient will perform eating with assist, with/without cues using equipment (OT) Flowsheets (Taken 02/17/2019 1359) LTG: Pt will perform eating with assistance level of: Independent with assistive device    Problem: RH Grooming Goal: LTG Patient will perform grooming w/assist,cues/equip (OT) Description: LTG: Patient will perform grooming with assist, with/without cues using equipment (OT) Flowsheets (Taken 02/17/2019 1359) LTG: Pt will perform grooming with assistance level of: Independent with assistive device    Problem: RH Bathing Goal: LTG Patient will bathe all body parts with assist levels (OT) Description: LTG: Patient will bathe all body parts with assist levels (OT) Flowsheets (Taken 02/17/2019 1359) LTG: Pt will perform bathing with assistance level/cueing: Supervision/Verbal cueing   Problem: RH Dressing Goal: LTG Patient will perform upper body dressing (OT) Description: LTG Patient will perform upper body dressing with assist, with/without cues (OT). Flowsheets (Taken 02/17/2019 1359) LTG: Pt will perform upper body dressing with assistance level of:  Supervision/Verbal cueing Goal: LTG Patient will perform lower body dressing w/assist (OT) Description: LTG: Patient will perform lower body dressing with assist, with/without cues in positioning using equipment (OT) Flowsheets (Taken 02/17/2019 1359) LTG: Pt will perform lower body dressing with assistance level of: Supervision/Verbal cueing   Problem: RH Toileting Goal: LTG Patient will perform toileting task (3/3 steps) with assistance level (OT) Description: LTG: Patient will perform toileting task (3/3 steps) with assistance level (OT)  Flowsheets (Taken 02/17/2019 1359) LTG: Pt will perform toileting task (3/3 steps) with assistance level: Supervision/Verbal cueing   Problem: RH Functional Use of Upper Extremity Goal: LTG Patient will use RT/LT upper extremity as a (OT) Description: LTG: Patient will use right/left upper extremity as a stabilizer/gross assist/diminished/nondominant/dominant level with assist, with/without cues during functional activity (OT) Flowsheets (Taken 02/17/2019 1359) LTG: Use of upper extremity in functional activities: LUE as a stabilizer   Problem: RH Toilet Transfers Goal: LTG Patient will perform toilet transfers w/assist (OT) Description: LTG: Patient will perform toilet transfers with assist, with/without cues using equipment (OT) Flowsheets (Taken 02/17/2019 1359) LTG: Pt will perform toilet transfers with assistance level of: Supervision/Verbal cueing   Problem: RH Tub/Shower Transfers Goal: LTG Patient will perform tub/shower transfers w/assist (OT) Description: LTG: Patient will perform tub/shower transfers with assist, with/without cues using equipment (OT) Flowsheets (Taken 02/17/2019 1359) LTG: Pt will perform tub/shower stall transfers with assistance level of: Supervision/Verbal cueing

## 2019-02-17 NOTE — Evaluation (Signed)
Physical Therapy Assessment and Plan  Patient Details  Name: Jane Watkins MRN: [id number] Date of Birth: [birth year]  PT Diagnosis: Difficulty walking, Hemiplegia dominant, Hypertonia, Muscle weakness and Pain in joint Rehab Potential: Good ELOS: 7-10 days   Today's Date: 02/17/2019 PT Individual Time: 8250-5397 PT Individual Time Calculation (min): 64 min    Problem List:  Patient Active Problem List   Diagnosis Date Noted  . 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 Polysubstance abuse ([place])   . Dyslipidemia   . Ischemic stroke diagnosed during current admission ([place]) 02/13/2019  . MDD (major depressive disorder), recurrent episode, moderate ([place]) 02/10/2019  . Dysphagia 02/09/2019  . Elevated CK 02/09/2019  . CVA (cerebral vascular accident) ([place]) 02/07/2019  . Cellulitis of arm 09/20/2017  . IV drug abuse ([place]) 09/20/2017  . Quadriceps weakness 06/18/2016  . TBI (traumatic brain injury) ([place]) 05/01/2016  . 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 Respiratory distress   . Glasgow coma scale total score 3-8 ([place])   . Pneumonia of both lower lobes due to methicillin resistant Staphylococcus aureus (MRSA) ([place])   . Acute pulmonary edema ([facility])   . Non-traumatic rhabdomyolysis   . Opioid abuse ([place])   . Elevated troponin   . Acute respiratory failure with hypoxia ([place])   . Drug overdose   . Elevated liver enzymes   . Encephalopathy acute   . Encephalopathy 04/19/2016  . Opiate overdose ([place]) 04/19/2016    Past Medical History:  Past Medical History:  Diagnosis Date  . AKI (acute kidney injury) ([place]) 2018   "from overdose"  . Anxiety   . Daily headache   . Depression   . Drug overdose 04/2016   Jane Watkins 05/01/2016  . GERD (gastroesophageal reflux disease)    "when I was younger; gone now" (09/21/2017)  . Migraine    "q couple weeks" (09/21/2017)  . Overdose    Past Surgical History:  Past Surgical History:  Procedure Laterality Date  . APPENDECTOMY   04/2013  . I & D EXTREMITY Left 09/20/2017   Procedure: IRRIGATION AND DEBRIDEMENT LEFT ARM;  Surgeon: Jane Planas, MD;  Location: [facility];  Service: Orthopedics;  Laterality: Left;  . TEE WITHOUT CARDIOVERSION N/A 02/10/2019   Procedure: TRANSESOPHAGEAL ECHOCARDIOGRAM (TEE);  Surgeon: Jane Sable, MD;  Location: [facility];  Service: Cardiovascular;  Laterality: N/A;  Polysubstance abuser  . TONGUE SURGERY  ~ 2007   "related to lisp"  . TRACHEOSTOMY  04/2016   Jane Watkins 05/01/2016    Assessment & Plan Clinical Impression: Patient is a 24 year old left-handed female with history of IV drug use, polysubstance/tobacco abuse, anxiety.  History taken from chart review, patient, and boyfriend. Patient on no prescription medications at time of admission.  Patient lives with her boyfriend.  She reportedly works at [employer].  Independent prior to admission.  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  She was discharged home with family ambulating extended distances with a rolling walker.  Presented 02/07/2019 after being found unresponsive. EMS was contacted the patient received Narcan with some improvement in mental status.  Admission labs with WBC 20,700, potassium 5.2, BUN 25, creatinine 0.67, urine drug screen positive cocaine, marijuana and benzodiazepines, alcohol level less than 10, SARS coronavirus negative, blood cultures no growth to date.  Cranial CT scan showed areas of indistinct low-density in the right basal ganglia with mass-effect on the right lateral ventricle.  Patient did not receive TPA.  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  CTA of head and neck showed no major arterial occlusion or significant stenosis.  Echocardiogram with EF of 60%. Neurology  follow-up currently maintained on aspirin and Plavix for CVA prophylaxis x3 weeks then aspirin alone with Plavix to be completed 03/02/2019.  Subcutaneous Lovenox for DVT prophylaxis.  Psychiatry consulted for depression and maintained on Seroquel as well as Prozaac.  Tolerating a regular diet.  Therapy evaluations completed and patient was admitted for a comprehensive rehab program. Please see preadmission assessment from earlier today as well Patient transferred to CIR on 02/16/2019 .   Patient currently requires min/ mod with mobility secondary to muscle weakness, muscle joint tightness and muscle paralysis, decreased cardiorespiratoy endurance, impaired timing and sequencing, abnormal tone and decreased coordination and decreased sitting balance, decreased standing balance, decreased postural control, hemiplegia and decreased balance strategies.  Prior to hospitalization, patient was independent  with mobility and lived with Significant other in a [place] home.  Home access is  Level entry.  Patient will benefit from skilled PT intervention to maximize safe functional mobility, minimize fall risk and decrease caregiver burden for planned discharge home with 24 hour supervision.  Anticipate patient will benefit from follow up [place] at discharge.  PT - End of Session Activity Tolerance: Decreased this session PT Assessment Rehab Potential (ACUTE/IP ONLY): Good PT Patient demonstrates impairments in the following area(s): Balance;Endurance;Motor;Safety;Sensory;Skin Integrity;Perception;Pain PT Transfers Functional Problem(s): Bed Mobility;Bed to Chair;Car;Furniture PT Locomotion Functional Problem(s): Ambulation;Stairs PT Plan PT Intensity: Minimum of 1-2 x/day ,45 to 90 minutes PT Frequency: 5 out of 7 days PT Duration Estimated Length of Stay: 7-10 days PT Treatment/Interventions: Ambulation/gait training;Balance/vestibular training;Community reintegration;Discharge planning;Disease  management/prevention;DME/adaptive equipment instruction;Functional mobility training;Neuromuscular re-education;Pain management;Patient/family education;Functional electrical stimulation;Psychosocial support;Skin care/wound management;Splinting/orthotics;Stair training;Therapeutic Activities;Therapeutic Exercise;UE/LE Coordination activities;UE/LE Strength taining/ROM;Wheelchair propulsion/positioning PT Transfers Anticipated Outcome(s): supervision to CGA PT Locomotion Anticipated Outcome(s): supervision to CGA PT Recommendation Recommendations for Other Services: Therapeutic Recreation consult Therapeutic Recreation Interventions: Stress management;Outing/community reintergration Follow Up Recommendations: Home health PT;24 hour supervision/assistance Patient destination: Home Equipment Recommended: Rolling walker with 5" wheels(L hand orthosis)  Skilled Therapeutic Intervention Evaluation completed (see details above and below) with education on PT POC and goals and individual treatment initiated with focus on functional bed mobility, transfers, gait, and NMR to address impairments due to impaired postural control and L hemiplegia. Pt requires extra time to arouse but once awake, pt presents with flat affect but follows all commands without difficulty and demonstrates appropriate behaviors. Pt requires overall min to mod assist for functional transfers, due to weakness in LLE hip extension has difficulty with stepping backwards during ambulatory transfers without some physical assist by the PT. Pt was able to don pants and socks with extra time and some assistance due to LUE/LLE weakness. Requires total assist for L shoe. Initial gait assessment without AD with min/mod +2 assist HHA with occasional need for L knee blocking and for L ankle due to tendency for ankle inverting due to questionable increased tone. Pt performed toileting with min/mod assist overall for ambulatory transfer as described and  requires some assist with pulling up left side of pants. Performed simulated car transfer with RW and L hand orthosis with overall min to mod assist with assist needed for management of RW during turning and assist for LLE during stepping backward to transfer. Performed gait up/down ramp with RW and L hand orthosis  with overall mod to max assist for management of RW and occasional need to assist with placement of LLE due to ankle inverting.  End of session transferred to recliner with all needs in reach and LUE supported on pillow, requiring min assist for transfer without AD to the R.   PT Evaluation Precautions/Restrictions Precautions Precautions: Fall Precaution Comments: L hemi Restrictions Weight Bearing Restrictions: No Pain Pain Assessment Pain Scale: 0-10 Pain Score: 0-No pain Home Living/Prior Functioning Home Living Available Help at Discharge: Available 24 hours/day;Other (Comment)(boyfriend) Type of Home: Apartment Home Access: Level entry Home Layout: One level  Lives With: Significant other Prior Function Level of Independence: Independent with gait;Independent with transfers  Able to Take Stairs?: Yes Vocation: Full time employment Vocation Requirements: manages a [employer] Leisure: Hobbies-yes (Comment) Comments: likes the beachand travel    Cognition Orientation Level: Oriented X4 Comments: flat affect Sensation Sensation Light Touch: Appears Intact Coordination Gross Motor Movements are Fluid and Coordinated: No Motor  Motor Motor: Hemiplegia;Abnormal tone;Abnormal postural alignment and control Motor - Skilled Clinical Observations: L hemi  Mobility Bed Mobility Bed Mobility: Rolling Right;Rolling Left;Supine to Sit Rolling Right: Supervision/verbal cueing Rolling Left: Supervision/Verbal cueing Supine to Sit: Minimal Assistance - Patient > 75% Transfers Transfers: Stand to Sit;Sit to Stand Sit to Stand: Minimal Assistance - Patient > 75% Stand to  Sit: Minimal Assistance - Patient > 75% Transfer (Assistive device): 1 person hand held assistmin/mod assist due to poor LLE strength Locomotion  Gait Gait Distance (Feet): 50 Feet Assistive device: None Gait Gait Pattern: Impaired Stairs / Additional Locomotion Stairs: Yes Stairs Assistance: 2 Helpers(mod assist except +2 for LLE managemnet) Stair Management Technique: One rail Right;Step to pattern Number of Stairs: 4 Height of Stairs: 6 Ramp: Maximal Assistance - Patient 25 - 49% Wheelchair Mobility Wheelchair Mobility: No  Trunk/Postural Assessment  Cervical Assessment Cervical Assessment: Within Functional Limits Thoracic Assessment Thoracic Assessment: Within Functional Limits Lumbar Assessment Lumbar Assessment: Within Functional Limits Postural Control Postural Control: Deficits on evaluation Protective Responses: delayed in adequate  Balance Balance Balance Assessed: Yes Static Sitting Balance Static Sitting - Level of Assistance: 5: Stand by assistance Dynamic Sitting Balance Dynamic Sitting - Level of Assistance: 4: Min assist Static Standing Balance Static Standing - Level of Assistance: 4: Min assist Dynamic Standing Balance Dynamic Standing - Level of Assistance: 3: Mod assist Extremity Assessment      RLE Assessment RLE Assessment: Within Functional Limits LLE Assessment LLE Assessment: Exceptions to [facility] Passive Range of Motion (PROM) Comments: [facility] Strength Comments: 0/5 ankle PF/DF; 2-/5 knee; 3-/5 hip flexion, 2-/5 hip extension LLE Tone LLE Tone: Mild(plantarflexion)    Refer to Care Plan for Long Term Goals  Recommendations for other services: Neuropsych and Therapeutic Recreation  Stress management and Outing/community reintegration  Discharge Criteria: Patient will be discharged from PT if patient refuses treatment 3 consecutive times without medical reason, if treatment goals not met, if there is a change in medical status, if  patient makes no progress towards goals or if patient is discharged from hospital.  The above assessment, treatment plan, treatment alternatives and goals were discussed and mutually agreed upon: by patient  Juanna Cao, PT, DPT, CBIS  02/17/2019, 12:29 PM

## 2019-02-17 NOTE — Progress Notes (Signed)
New admit, slept part of night, large BM on BSC with stand pivot. Difficulty sleeping reported

## 2019-02-17 NOTE — Progress Notes (Signed)
Patient c/o headache that has increased since tylenol was administered.  Pt has waited over an hour with no relief.  Pt is lying flat in a dark room with an ice pack on her head but is getting no relief.  Paged on call Delle Reining at [phone]

## 2019-02-18 ENCOUNTER — Encounter: Payer: Self-pay | Admitting: Obstetrics and Gynecology

## 2019-02-18 ENCOUNTER — Ambulatory Visit (HOSPITAL_COMMUNITY): Payer: 59 | Admitting: *Deleted

## 2019-02-18 ENCOUNTER — Inpatient Hospital Stay (HOSPITAL_COMMUNITY): Payer: 59

## 2019-02-18 ENCOUNTER — Inpatient Hospital Stay (HOSPITAL_COMMUNITY): Payer: 59 | Admitting: Occupational Therapy

## 2019-02-18 MED ORDER — TOPIRAMATE 25 MG PO TABS
25.0000 mg | ORAL_TABLET | Freq: Every day | ORAL | Status: DC
  Administered 2019-02-18: 25 mg via ORAL
  Filled 2019-02-18: qty 1

## 2019-02-18 MED ORDER — TOPIRAMATE 25 MG PO TABS
25.0000 mg | ORAL_TABLET | Freq: Once | ORAL | Status: AC
  Administered 2019-02-18: 25 mg via ORAL
  Filled 2019-02-18: qty 1

## 2019-02-18 NOTE — Progress Notes (Signed)
Occupational Therapy Session Note  Patient Details  Name: Jane Watkins MRN: [id number] Date of Birth: [birth year]  Today's Date: 02/18/2019 OT Individual Time: 6244-6950 OT Individual Time Calculation (min): 13 min  and Today's Date: 02/18/2019 OT Missed Time: 47 Minutes Missed Time Reason: Patient fatigue;Patient unwilling/refused to participate without medical reason(Pt with migraine and declines OOB activities)  Short Term Goals: Week 1:  OT Short Term Goal 1 (Week 1): LTG=STG 2/2 ELOS  Skilled Therapeutic Interventions/Progress Updates:    Pt in bed with eyes closed and reporting migraine headache. Pt declined any OOB activity at this time. PT agreeable to [facility] system and ROM of L UE. OT provided gentle ROM and stretching to L UE and repositioined for support on pillow. SAEBO system placed on wrist extensors and left for 1 hour. OT returned to removed SAEBO system. Skin intact and no adverse reactions. Pt left in bed with needs met.  Therapy Documentation Precautions:  Precautions Precautions: Fall Precaution Comments: L hemi Restrictions Weight Bearing Restrictions: No General: General OT Amount of Missed Time: 52 Minutes OT Missed Treatment Reason: Pain(pt c/o migraine) Pain: Pain Assessment Pain Scale: 0-10 Pain Score: 7  Pain Type: Acute pain Pain Location: Head Pain Orientation: Right;Left;Anterior Pain Descriptors / Indicators: Headache Pain Onset: Awakened from sleep Pain Intervention(s): Repositioned/rest  Therapy/Group: Individual Therapy  Valma Cava 02/18/2019, 12:56 PM

## 2019-02-18 NOTE — IPOC Note (Signed)
Overall Plan of Care [facility]) Patient Details Name: Jane Watkins MRN: [id number] DOB: 1996/01/16  Admitting Diagnosis: Right middle cerebral artery stroke [facility])  Hospital Problems: Principal Problem:   Right middle cerebral artery stroke [facility])     Functional Problem List: Nursing Behavior, Safety, Medication Management  PT Balance, Endurance, Motor, Safety, Sensory, Skin Integrity, Perception, Pain  OT Balance, Endurance, Motor, Safety  SLP    TR         Basic ADL's: OT Eating, Grooming, Dressing, Bathing, Toileting     Advanced  ADL's: OT       Transfers: PT Bed Mobility, Bed to Chair, Car, Manufacturing systems engineer, Metallurgist: PT Ambulation, Stairs     Additional Impairments: OT Fuctional Use of Upper Extremity  SLP        TR      Anticipated Outcomes Item Anticipated Outcome  Self Feeding Mod I  Swallowing      Basic self-care  Supervision  Toileting  supervision   Bathroom Transfers supervision  Bowel/Bladder  pt is continent x 2 LBM 02/15/19  Transfers  supervision to CGA  Locomotion  supervision to CGA  Communication     Cognition     Pain  less than 2, pt reports no pain  Safety/Judgment  to remain fall free while in rehab   Therapy Plan: PT Intensity: Minimum of 1-2 x/day ,45 to 90 minutes PT Frequency: 5 out of 7 days PT Duration Estimated Length of Stay: 7-10 days OT Intensity: Minimum of 1-2 x/day, 45 to 90 minutes OT Frequency: 5 out of 7 days OT Duration/Estimated Length of Stay: 7-10 days     Due to the current state of emergency, patients may not be receiving their 3-hours of Medicare-mandated therapy.   Team Interventions: Nursing Interventions Patient/Family Education, Disease Management/Prevention, Discharge Planning, Medication Management  PT interventions Ambulation/gait training, Training and development officer, Community reintegration, Discharge planning, Disease management/prevention, DME/adaptive equipment  instruction, Functional mobility training, Neuromuscular re-education, Pain management, Patient/family education, Functional electrical stimulation, Psychosocial support, Skin care/wound management, Splinting/orthotics, Stair training, Therapeutic Activities, Therapeutic Exercise, UE/LE Coordination activities, UE/LE Strength taining/ROM, Wheelchair propulsion/positioning  OT Interventions Training and development officer, Community reintegration, Discharge planning, Disease mangement/prevention, Engineer, drilling, Functional electrical stimulation, Functional mobility training, Neuromuscular re-education, Pain management, Patient/family education, Psychosocial support, Self Care/advanced ADL retraining, Splinting/orthotics, Therapeutic Activities, Therapeutic Exercise, UE/LE Strength taining/ROM, UE/LE Coordination activities, Wheelchair propulsion/positioning  SLP Interventions    TR Interventions    SW/CM Interventions     Barriers to Discharge MD  Medical stability  Nursing      PT      OT      SLP      SW       Team Discharge Planning: Destination: PT-Home ,OT- Home , SLP-  Projected Follow-up: PT-Home health PT, 24 hour supervision/assistance, OT-  Outpatient OT, SLP-  Projected Equipment Needs: PT-Rolling walker with 5" wheels(L hand orthosis), OT- To be determined, SLP-  Equipment Details: PT- , OT-  Patient/family involved in discharge planning: PT- Patient,  OT-Patient, SLP-   MD ELOS: 7-10 days Medical Rehab Prognosis:  Excellent Assessment: The patient has been admitted for CIR therapies with the diagnosis of right MCA infarct. The team will be addressing functional mobility, strength, stamina, balance, safety, adaptive techniques and equipment, self-care, bowel and bladder mgt, patient and caregiver education, NMR spasticity mgt, community reentry, substance abuse. Goals have been set at supervision for self-care and mobility.   Due to the current state  of  emergency, patients may not be receiving their 3 hours per day of Medicare-mandated therapy.    Ranelle Oyster, MD, FAAPMR      See Team Conference Notes for weekly updates to the plan of care

## 2019-02-18 NOTE — Progress Notes (Signed)
Physical Therapy Note  Patient Details  Name: Jane Watkins MRN: [id number] Date of Birth: 30-Oct-1995 Today's Date: 02/18/2019    Attempted to see pt with recreational therapist. Pt received in bed asleep with lights off & windows closed. Pt briefly responds to verbal communication reporting she has a migraine but does not open eyes & no further communication. Pt missed 60 minutes of skilled PT treatment 2/2 migraine, will f/u per POC.  Sandi Mariscal 02/18/2019, 11:17 AM

## 2019-02-18 NOTE — Progress Notes (Signed)
[facility] PHYSICAL MEDICINE & REHABILITATION PROGRESS NOTE   Subjective/Complaints: C/o of "migraine" headache. Says that therapy went pretty well yesterday. Able to sleep last night  ROS: Patient denies fever, rash, sore throat, blurred vision, nausea, vomiting, diarrhea, cough, shortness of breath or chest pain, joint or back pain,  or mood change.    Objective:   No results found. Recent Labs    02/16/19 1658 02/17/19 0505  WBC 8.6 9.0  HGB 15.5* 14.5  HCT 47.5* 43.6  PLT 385 353   Recent Labs    02/16/19 1658 02/17/19 0505  NA  --  141  K  --  4.2  CL  --  110  CO2  --  22  GLUCOSE  --  90  BUN  --  26*  CREATININE 0.59 0.69  CALCIUM  --  9.4    Intake/Output Summary (Last 24 hours) at 02/18/2019 1151 Last data filed at 02/17/2019 1756 Gross per 24 hour  Intake 460 ml  Output --  Net 460 ml     Physical Exam: Vital Signs Blood pressure 106/66, pulse 66, temperature 98 F (36.7 C), temperature source Oral, resp. rate 17, last menstrual period 01/31/2019, SpO2 100 %. Constitutional: No distress . Vital signs reviewed. Frail appearing HEENT: EOMI, oral membranes moist Neck: supple Cardiovascular: RRR without murmur. No JVD    Respiratory: CTA Bilaterally without wheezes or rales. Normal effort    GI: BS +, non-tender, non-distended  Musculoskeletal:     Comments: No edema or tenderness in extremities  Neurological: She is alert.  Follows basic commands.   Oriented x3.   Motor: RUE/RLE: 5/5 proximal to distal LUE: 2- to 2/5 proximal to distal LLE: HF, KE 2/5, ADF trace/5- APF 1 to 1+/5. Heel cord tight Tone: MAS 1 LLE and 1+ LUE  Skin: Skin is warm and dry.  Psychiatric: flat but cooperative and pleasant    Assessment/Plan: 1. Functional deficits secondary to right MCA infarction which require 3+ hours per day of interdisciplinary therapy in a comprehensive inpatient rehab setting.  Physiatrist is providing close team supervision and 24 hour  management of active medical problems listed below.  Physiatrist and rehab team continue to assess barriers to discharge/monitor patient progress toward functional and medical goals  Care Tool:  Bathing    Body parts bathed by patient: Left arm, Chest, Abdomen, Front perineal area, Buttocks, Right upper leg, Left upper leg, Face   Body parts bathed by helper: Left lower leg, Right lower leg, Right arm     Bathing assist Assist Level: Moderate Assistance - Patient 50 - 74%     Upper Body Dressing/Undressing Upper body dressing   What is the patient wearing?: Pull over shirt, Bra    Upper body assist Assist Level: Moderate Assistance - Patient 50 - 74%    Lower Body Dressing/Undressing Lower body dressing      What is the patient wearing?: Pants, Underwear/pull up     Lower body assist Assist for lower body dressing: Moderate Assistance - Patient 50 - 74%     Toileting Toileting Toileting Activity did not occur Landscape architect and hygiene only): N/A (no void or bm)  Toileting assist Assist for toileting: Minimal Assistance - Patient > 75%     Transfers Chair/bed transfer  Transfers assist     Chair/bed transfer assist level: Minimal Assistance - Patient > 75%     Locomotion Ambulation   Ambulation assist      Assist level: 2 helpers  Max distance: 30'   Walk 10 feet activity   Assist     Assist level: 2 helpers     Walk 50 feet activity   Assist    Assist level: 2 helpers      Walk 150 feet activity   Assist Walk 150 feet activity did not occur: Safety/medical concerns         Walk 10 feet on uneven surface  activity   Assist     Assist level: 2 helpers     Wheelchair     Assist Will patient use wheelchair at discharge?: No             Wheelchair 50 feet with 2 turns activity    Assist            Wheelchair 150 feet activity     Assist          Blood pressure 106/66, pulse 66,  temperature 98 F (36.7 C), temperature source Oral, resp. rate 17, last menstrual period 01/31/2019, SpO2 100 %.  Medical Problem List and Plan: 1.  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  Antithrombotics: -DVT/anticoagulation: Subcutaneous Lovenox             -antiplatelet therapy: Aspirin 81 mg daily and Plavix 75 mg daily until 03/02/2019 and then aspirin alone 3. 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  4. Mood: Seroquel 25 mg every morning and 100 mg nightly, Prozac 25 mg daily.                           Emotional support             -antipsychotic agents: N/A 5. Neuropsych: This patient is capable of making decisions on her own behalf. 6. Skin/Wound Care: Routine skin checks 7. Fluids/Electrolytes/Nutrition: encourage PO  1/7 -BUN sl elevated  Continue to encourage fluids.     8.  Polysubstance abuse as well as tobacco abuse.  Provide counseling  -will need intensive outpt follow up given history 9.  Hyperlipidemia.  Continue Lipitor    LOS: 2 days A FACE TO FACE EVALUATION WAS PERFORMED  3/7 02/18/2019, 11:51 AM

## 2019-02-18 NOTE — Progress Notes (Signed)
Recreational Therapy Session Note  Patient Details  Name: Genesys Coggeshall MRN: [id number] Date of Birth: [birth year] Today's Date: 02/18/2019  Eval deferred until next week.  Pt in bed upon arrival with c/o migraine and difficult to arouse.  Pt did not open eyes, only stated that she had a migraine.  Sonna Lipsky 02/18/2019, 11:40 AM

## 2019-02-18 NOTE — Progress Notes (Signed)
Physical Therapy Session Note  Patient Details  Name: Jane Watkins MRN: [id number] Date of Birth: 1995/08/15  Today's Date: 02/18/2019 PT Individual Time: 0626-9485 PT Individual Time Calculation (min): 72 min   Short Term Goals: Week 1:  PT Short Term Goal 1 (Week 1): = LTGs overall supervision Week 2:    Week 3:     Skilled Therapeutic Interventions/Progress Updates:      Therapy Documentation Precautions:  Precautions Precautions: Fall Precaution Comments: L hemi Restrictions Weight Bearing Restrictions: No  PAIN states she has headache today.  Treatment to tolerance.    Pt initially supine in bed w/boyfriend present. Boyfriend/Jane Watkins had brought shoes as requested so use of AFO possible today.  Supine to sit to L side of bed w/supervision using rails.  STS w/RW and cga, gait 64ft to commode w/RW and min assist/careful guarding of L ankle. Commode transfer w/verbal cues for sequencing and safety backing fully to commode.  Gait 56ft to wc, turns/sit transfer to wc w/cues for safety/sequencing/fully backing, min assist. Pt transported to gym for continued session.    Pt fitted w/trial L AFO and instructed w/process for putting into shoe and donning.  Therapist adjusted and donned AFO then pt performed: STS w/cga to RW, gait 173ft w/RW w/improved ankle stability and LLE clearance, but contines to have difficulty fully clearing due to decreased hamstring strength, decreased knee flexion w/swing.  Added tape toe cap to shoe and this further improved gait pattern w/ less catching of toe thru swing and improved efficiency/safety.  Pt does demonstrate strength and tone issues causing further deviations including decreased step length, mild ataxia, wobbles L knee w/loading and episodes of extension thrust w/loading.    Gait 164ft without AD w/min assist and mild increase in deviations, performed as gait challenge/balance challenge.  Mat therex: single leg bridge w/LLE only, RLE elevated by  therapist to isolate L, 8 reps w/3 sec hold Supine to prone w/mod assist for LUE and LLE w/transition.  Prone eccentric hamstrings x 8 reps w/ improved acivation after 2 attempts then quick to fatigue. Prone to supine to sit w/min assist. SPT to wc w/cga and cues for safety/fully turning and backing to chair.    Returned to room w/pt and performed training w/Jane Watkins for wc to/from bed, use of AFO and gait belt. Jane Watkins able to properly assist pt w/donning of AFO/shoe and voiced understanding that this was absolutely required for any transfers he would perform w/pt.   Demonstrated safe technique w/pt for performing and guarding supine to sit, sts, and spt to/from wc going to both L and R sides.  Jane Watkins then practiced this mult times w/pt and was able to demonstrate safe technique.  Educated pt/Jane Watkins that he is not cleared for ambulating w/pt at this time as this will require further training.  Pt voiced understanding.  Nursing notified of above clearance.  Pt left supine w/needs in reach and [place] present.  Therapy/Group: Individual Therapy  Jane Watkins, PT   Shearon Balo 02/18/2019, 3:56 PM

## 2019-02-19 ENCOUNTER — Inpatient Hospital Stay (HOSPITAL_COMMUNITY): Payer: 59 | Admitting: Physical Therapy

## 2019-02-19 ENCOUNTER — Inpatient Hospital Stay (HOSPITAL_COMMUNITY): Payer: 59

## 2019-02-19 ENCOUNTER — Inpatient Hospital Stay (HOSPITAL_COMMUNITY): Payer: 59 | Admitting: Occupational Therapy

## 2019-02-19 MED ORDER — TOPIRAMATE 25 MG PO TABS
25.0000 mg | ORAL_TABLET | Freq: Two times a day (BID) | ORAL | Status: DC
  Administered 2019-02-19 – 2019-02-20 (×2): 25 mg via ORAL
  Filled 2019-02-19 (×2): qty 1

## 2019-02-19 NOTE — Progress Notes (Signed)
Occupational Therapy Session Note  Patient Details  Name: Jane Watkins MRN: [id number] Date of Birth: 1995/07/29  Today's Date: 02/19/2019 OT Individual Time: 9381-0175 OT Individual Time Calculation (min): 60 min   Short Term Goals: Week 1:  OT Short Term Goal 1 (Week 1): LTG=STG 2/2 ELOS  Skilled Therapeutic Interventions/Progress Updates:    Pt greeted asleep in bed, difficult to get moving this morning. Pt reports she still has a migraine. Nursing to see if any meds can be given. With encouragement, pt agreeable to shower. Stand-pivot transfers with min A throughout session. Pt reported need to urinate. Min A to manage clothing. Pt with successful void of bladder, then min A to ambulate short distance into shower. Bathing completed with min A to shampoo hair and CGA for balance when standing to wash buttocks. Educated on hemi-dressing techniques with pt still needing mod A for dressing 2/2 tighter fitting clothing. Educated on trying sweat pants instead of leggings, but pt reports she mostly wears leggings. Pt completed hair brushing and grooming tasks with set-up A. Stand-pivot back to bed with CGA. OT set pt up for breakfast with HOB raised in bed. OT then placed SAEBO e-stim system to wrist extensors for 60 minutes. OT returned to remove SAEBO with skin intact and no complications.   Therapy Documentation Precautions:  Precautions Precautions: Fall Precaution Comments: L hemi Restrictions Weight Bearing Restrictions: No Pain: Pain Assessment Pain Score: Asleep Pt reported headache pain, no number given. Rest and relaxation for pain management Therapy/Group: Individual Therapy  [name] 02/19/2019, 9:07 AM

## 2019-02-19 NOTE — Progress Notes (Addendum)
Physical Therapy Session Note  Patient Details  Name: Jane Watkins MRN: [id number] Date of Birth: 1995-04-29  Today's Date: 02/19/2019 PT Individual Time: 1255-1350 PT Individual Time Calculation (min): 55 min   Short Term Goals: Week 1:  PT Short Term Goal 1 (Week 1): = LTGs overall supervision  Skilled Therapeutic Interventions/Progress Updates:  Pt received sound asleep in bed with boyfriend Jane Watkins) present & waking pt with pt requiring extra time. [name] dons B tennis shoes & L AFO. Pt transfers sit>stand with CGA & ambulates short distance around bed with min assist from therapist. MD assessed pt & recommends arm trough as he notes mild L shoulder subluxation. Therapist provides pt with half lap tray as no troughs are available. In gym, pt ambulates 100 ft without AD & min/mod assist 2/2 frequent LOB with pt demonstrating L knee A/P instability, intermittent inversion at the ankle, and significantly impaired awareness & placement of LLE with pt demonstrating excessive increased weight shifting R to allow increased ease of L foot advancement during swing phase. Obtained PLS with lateral support to assist with increased ankle support but unable to fit this AFO in pt's shoe due to tongue of shoe still being attached to shoe. Reverted back to using PLS & added small heel wedge to attempt to correct hyperextension. Pt ambulated ~25 ft with mod assist without AD with pt maintaining L knee flexion throughout & worsening balance. Pt assisted back to room & transferred w/c<>toilet with min assist & grab bar with pt requiring assistance to manage clothing over L hips. Pt with continent void on toilet & performs peri hygiene. Pt requests to use hand sanitizer vs washing hands at sink. Pt transfers w/c>bed with min assist stand pivot with cuing for LLE placement prior to standing and min assist for LLE for sit>supine. Pt left in bed with alarm set & all needs in reach, NT in room.  Therapy  Documentation Precautions:  Precautions Precautions: Fall Precaution Comments: L hemi Restrictions Weight Bearing Restrictions: No  Pain: Pt c/o 6/10 HA - MD & RN made aware & RN administered meds during session.    Therapy/Group: Individual Therapy  Sandi Mariscal 02/19/2019, 1:55 PM

## 2019-02-19 NOTE — Progress Notes (Signed)
Physical Therapy Session Note Extended session x 24 min for makeup  Patient Details  Name: Jane Watkins MRN: [id number] Date of Birth: Jun 26, 1995  Today's Date: 02/19/2019 PT Individual Time: 1040-1204 PT Individual Time Calculation (min): 84 min   Short Term Goals: Week 1:  PT Short Term Goal 1 (Week 1): = LTGs overall supervision Week 2:     Skilled Therapeutic Interventions/Progress Updates:      Therapy Documentation Precautions:  Precautions Precautions: Fall Precaution Comments: L hemi Restrictions Weight Bearing Restrictions: No  PAIN pt c/o headache /10   Rest provided as needed  Pt initially supine in bed.  Supine to sit on EOB w/supervision, cues to attend to L hand positioning.  Maintains balance w/therapist assisting pt to donn shoes/AFO.  Pt able to donn R shoe w/assist to tie, would benefit from shoe buttons.  Dependent on L due to difficulty w/AFO.  STS w/cues for midline, increased tone LLE.  Gait 35ft including turns w/min assist due to increased tone , difficulty maintaining foot flat contact w/floor initially on L Attempted wc propulsion w/Rexts but height of wc seat makes contact w/floor difficult.  Pt w/very apparent L inattention w/attempting to negotiate doorway w/wc. Pt transported to gym for remainder of session.  Stairs:  Asecends/descends w/min assist and use of single rail, scissoring of LLE w/descent difficult for pt to control plamnt of Lfoot safely on stair. Gait 144ft w/min assist to cga, cues for L foot placement.  Increasing extensor tone causes heel to rise in current AFO, increased L knee extension thrust today, increased scissoring of LLE. Second cait effort attempted to ID, negotiate, then pick up cones placed along path to L side but after 3 cones pt c/o dizzyness and increased headache pain so discontinued w/this. Pt instructed w/and performed Seated hamstring stretching 2 x45 sec  SPT to mat w/min assist for balance.  Sit to supine w/cues and  supervision.  Pt w/questions re: tone - educated pt about origin, progression/changes w/CVA, interventions.  Supine hip adducteo stretching 2x45 L in abd/add "clamshell" w/cues to attend x 8 reps Bridge w/L foot bias positioning 6reps w/3 count hold for glut/hs strengthening Hip abd/add w/L foot on Beasy board and cues for attention to task x 8 L TKE's w/cues to attent to L, tapping and overflow w/resistance to R all utilized to facilitate L quad activation w/success. Reverse plank (no UE use)  w/feet on yellow ball and cues to hold lower body at midline/attend to position of hips/feet for proprioceptive training/strengthening. 10 reps, holds 5-10 sec each  Supine to sit w/cues to attend to LUE.  STS w/cues for positioning feet/maintain midline w/transition.  Gait 40ft w/cga, slightly improved gait pattern.   Commode transfer w/cga and cues for safety.  Pt independent w/lowering pants and w/hygiene.  Requires min assist w/raising L side of pants.  Short distance gait to bed w/episode of inadvertant "kicking" of object on L side, boyfriend present and educated him re L inattention/safety implications.  He stated that he had read about this in the provided literature and was very receptive to further education.  Pt left seatedd on EOB w/boyfriend assisting w/removal of AFO and return to bed.  Reviewed need for AFO at all times and also that we would not be progressing training to include gait in room due to changing tone/fluctuating needs for assistance.    Pt w/increasing tone L exts noted today which did negatively effect gait pattern, use of AFO, safety.  Need to track this  and consider when ordering AFO.  May also need to be checked for leglength discrepancy- ? L longer than R.    Therapy/Group: Individual Therapy  Callie Fielding, [place] 02/19/2019, 12:31 PM

## 2019-02-19 NOTE — Progress Notes (Signed)
[facility] PHYSICAL MEDICINE & REHABILITATION PROGRESS NOTE   Subjective/Complaints:  No issues overnight except for headaches.  On daily Topamax as well as Tylenol.  ROS: Patient deniesnausea, vomiting, diarrhea, cough, shortness of breath or chest pain, joint or back pain,  or mood change.    Objective:   No results found. Recent Labs    02/16/19 1658 02/17/19 0505  WBC 8.6 9.0  HGB 15.5* 14.5  HCT 47.5* 43.6  PLT 385 353   Recent Labs    02/16/19 1658 02/17/19 0505  NA  --  141  K  --  4.2  CL  --  110  CO2  --  22  GLUCOSE  --  90  BUN  --  26*  CREATININE 0.59 0.69  CALCIUM  --  9.4    Intake/Output Summary (Last 24 hours) at 02/19/2019 1337 Last data filed at 02/18/2019 2012 Gross per 24 hour  Intake 120 ml  Output --  Net 120 ml     Physical Exam: Vital Signs Blood pressure 102/65, pulse 75, temperature 98.2 F (36.8 C), resp. rate 18, last menstrual period 01/31/2019, SpO2 100 %. Constitutional: No distress . Vital signs reviewed. Frail appearing HEENT: EOMI, oral membranes moist Neck: supple Cardiovascular: RRR without murmur. No JVD    Respiratory: CTA Bilaterally without wheezes or rales. Normal effort    GI: BS +, non-tender, non-distended  Musculoskeletal:     Comments: No edema or tenderness in extremities  Neurological: She is alert.  Follows basic commands.   Oriented x3.   Motor: RUE/RLE: 5/5 proximal to distal LUE: Trace proximal to distal LLE: HF, KE 2/5, ADF trace/5- APF 1 to 1+/5. Heel cord tight Tone: MAS 1 LLE and 1+ LUE  Left neglect noted Skin: Skin is warm and dry.  Psychiatric: flat but cooperative and pleasant    Assessment/Plan: 1. Functional deficits secondary to right MCA infarction which require 3+ hours per day of interdisciplinary therapy in a comprehensive inpatient rehab setting.  Physiatrist is providing close team supervision and 24 hour management of active medical problems listed below.  Physiatrist and  rehab team continue to assess barriers to discharge/monitor patient progress toward functional and medical goals  Care Tool:  Bathing    Body parts bathed by patient: Left arm, Chest, Abdomen, Front perineal area, Buttocks, Right upper leg, Left upper leg, Face   Body parts bathed by helper: Left lower leg, Right lower leg, Right arm     Bathing assist Assist Level: Moderate Assistance - Patient 50 - 74%     Upper Body Dressing/Undressing Upper body dressing   What is the patient wearing?: Pull over shirt, Bra    Upper body assist Assist Level: Moderate Assistance - Patient 50 - 74%    Lower Body Dressing/Undressing Lower body dressing      What is the patient wearing?: Pants, Underwear/pull up     Lower body assist Assist for lower body dressing: Moderate Assistance - Patient 50 - 74%     Toileting Toileting Toileting Activity did not occur Press photographer and hygiene only): N/A (no void or bm)  Toileting assist Assist for toileting: Minimal Assistance - Patient > 75%     Transfers Chair/bed transfer  Transfers assist     Chair/bed transfer assist level: Contact Guard/Touching assist     Locomotion Ambulation   Ambulation assist      Assist level: Minimal Assistance - Patient > 75%(increased scissoring of LLE today) Assistive device: No Device Max  distance: 150   Walk 10 feet activity   Assist     Assist level: Minimal Assistance - Patient > 75% Assistive device: No Device   Walk 50 feet activity   Assist    Assist level: Minimal Assistance - Patient > 75% Assistive device: No Device    Walk 150 feet activity   Assist Walk 150 feet activity did not occur: Safety/medical concerns  Assist level: Minimal Assistance - Patient > 75% Assistive device: No Device    Walk 10 feet on uneven surface  activity   Assist     Assist level: 2 helpers     Wheelchair     Assist Will patient use wheelchair at discharge?: No              Wheelchair 50 feet with 2 turns activity    Assist            Wheelchair 150 feet activity     Assist          Blood pressure 102/65, pulse 75, temperature 98.2 F (36.8 C), resp. rate 18, last menstrual period 01/31/2019, SpO2 100 %.  Medical Problem List and Plan: 1.  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  Antithrombotics: -DVT/anticoagulation: Subcutaneous Lovenox             -antiplatelet therapy: Aspirin 81 mg daily and Plavix 75 mg daily until 03/02/2019 and then aspirin alone 3. Pain Management: Tylenol as needed  1/8 add topamax 25mg QHS, will increase to twice daily on 1/10  1/8 -spastic left hemiparesis---ROM/splinting for now.  No painful tone noted. 4. Mood: Seroquel 25 mg every morning and 100 mg nightly, Prozac 25 mg daily.                           Emotional support             -antipsychotic agents: N/A 5. Neuropsych: This patient is capable of making decisions on her own behalf. 6. Skin/Wound Care: Routine skin checks 7. Fluids/Electrolytes/Nutrition: encourage PO  1/7 -BUN sl elevated  Continue to encourage fluids.     8.  Polysubstance abuse as well as tobacco abuse.  Provide counseling  -will need intensive outpt follow up given history 9.  Hyperlipidemia.  Continue Lipitor    LOS: 3 days A FACE TO FACE EVALUATION WAS PERFORMED  Charlett Blake 02/19/2019, 1:37 PM

## 2019-02-20 MED ORDER — TIZANIDINE HCL 2 MG PO TABS
2.0000 mg | ORAL_TABLET | Freq: Every day | ORAL | Status: DC
  Administered 2019-02-20: 2 mg via ORAL
  Filled 2019-02-20: qty 1

## 2019-02-20 MED ORDER — TOPIRAMATE 25 MG PO TABS
50.0000 mg | ORAL_TABLET | Freq: Two times a day (BID) | ORAL | Status: DC
  Administered 2019-02-20 – 2019-02-28 (×16): 50 mg via ORAL
  Filled 2019-02-20 (×16): qty 2

## 2019-02-20 NOTE — Progress Notes (Addendum)
Pt's friend has passed and has no knowledge, [name] (boyfriend) has concerns because this seems to be a repeat of what happened last stay and he doesn't want her to sign herself out as date is same day as discharge according in him. He is requesting Neuropsych see pt first. Feels that not going to handle it well. Neuropsych to see pt in am.

## 2019-02-20 NOTE — Progress Notes (Signed)
Social Work Assessment and Plan   Patient Details  Name: Jane Watkins MRN: [id number] Date of Birth: 07/08/1995  Today's Date: 02/18/2019  Problem List:  Patient Active Problem List   Diagnosis Date Noted  . 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 Polysubstance abuse (HCC)   . Dyslipidemia   . Ischemic stroke diagnosed during current admission (HCC) 02/13/2019  . MDD (major depressive disorder), recurrent episode, moderate (HCC) 02/10/2019  . Dysphagia 02/09/2019  . Elevated CK 02/09/2019  . CVA (cerebral vascular accident) (HCC) 02/07/2019  . Cellulitis of arm 09/20/2017  . IV drug abuse (HCC) 09/20/2017  . Quadriceps weakness 06/18/2016  . TBI (traumatic brain injury) (HCC) 05/01/2016  . 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 Respiratory distress   . Glasgow coma scale total score 3-8 (HCC)   . Pneumonia of both lower lobes due to methicillin resistant Staphylococcus aureus (MRSA) (HCC)   . Acute pulmonary edema (HCC)   . Non-traumatic rhabdomyolysis   . Opioid abuse (HCC)   . Elevated troponin   . Acute respiratory failure with hypoxia (HCC)   . Drug overdose   . Elevated liver enzymes   . Encephalopathy acute   . Encephalopathy 04/19/2016  . Opiate overdose (HCC) 04/19/2016   Past Medical History:  Past Medical History:  Diagnosis Date  . AKI (acute kidney injury) (HCC) 2018   "from overdose"  . Anxiety   . Daily headache   . Depression   . Drug overdose 04/2016   [name] 05/01/2016  . GERD (gastroesophageal reflux disease)    "when I was younger; gone now" (09/21/2017)  . Migraine    "q couple weeks" (09/21/2017)  . Overdose    Past Surgical History:  Past Surgical History:  Procedure Laterality Date  . APPENDECTOMY  04/2013  . I & D EXTREMITY Left 09/20/2017   Procedure: IRRIGATION AND DEBRIDEMENT LEFT ARM;  Surgeon: Bradly Bienenstock, MD;  Location: [facility] OR;  Service: Orthopedics;  Laterality: Left;  . TEE WITHOUT CARDIOVERSION N/A 02/10/2019    Procedure: TRANSESOPHAGEAL ECHOCARDIOGRAM (TEE);  Surgeon: Debbe Odea, MD;  Location: [facility] ORS;  Service: Cardiovascular;  Laterality: N/A;  Polysubstance abuser  . TONGUE SURGERY  ~ 2007   "related to lisp"  . TRACHEOSTOMY  04/2016   [name] 05/01/2016   Social History:  reports that she has been smoking cigarettes. She has a 5.00 pack-year smoking history. She has never used smokeless tobacco. She reports current drug use. Drugs: IV, Heroin, and Cocaine. She reports that she does not drink alcohol.  Family / Support Systems Marital Status: Separated Patient Roles: Partner Spouse/Significant Other: boyfriend of ~ 83 mos, [name] @ [phone] Other Supports: mother, Koren Bound [facility]) @ [phone];  father, Myer Peer @ [phone] Anticipated Caregiver: [name] (BF), Scarlett Presto (Mom), Myer Peer (dad) Ability/Limitations of Caregiver: [name] will be able to take some time off of work, and when he has to work he said he will drop [name] off at her mothers.  Okey Regal is okay being backup for 24/7 as well.  Caregiver Availability: 24/7 Family Dynamics: Per patient, she reports she has a good relationship with her mother and father.  Mother is in agreement with [name], patient's boyfriend, to be caregiver upon discharge.  Still to speak with parents.  Social History Preferred language: English Religion: Non-Denominational Cultural Background: N/A Read: Yes Write: Yes Employment Status: Employed Name of Employer: [employer] Return to Work Plans: Patient eager to return as soon as she is able. Legal History/Current Legal Issues:  None Guardian/Conservator: None -per MD, patient is capable of making decisions on her own behalf   Abuse/Neglect Abuse/Neglect Assessment Can Be Completed: Yes Physical Abuse: Denies Verbal Abuse: Denies Sexual Abuse: Denies Exploitation of patient/patient's resources: Denies Self-Neglect: Yes, present (Comment)(Patient with ongoing  substance abuse which has resulted in multiple hospitalizations.)  Emotional Status Pt's affect, behavior and adjustment status: Patient lying in bed and agreeable to assessment interview.  This patient is familiar to me as I followed her during her last CIR admission.  Patient very pleasant and soft-spoken.  Patient appears to have a good understanding that her current drug use was likely because of her stroke.  She denies any significant emotional distress, however, affect is rather flat.  Given the nature of her ongoing substance abuse issues and prior traumatic events, will refer for neuropsychology to see patient again with this state. Recent Psychosocial Issues: Patient with several hospitalizations over the past few years related to substance abuse injuries.  Death of boyfriend and 03-Jun-2016 due to drug overdose. Psychiatric History: Patient reports history and ongoing anxiety and feels that her drug use is her "way of self-medicating". Substance Abuse History: As noted, patient with significant substance abuse history.  She has been through treatment programs in the past.  States she is interested in finding a "new counselor and may be another type of medication I can take to manage my anxiety."  We will discuss with CIR neuropsychologist.  Patient / Family Perceptions, Expectations & Goals Pt/Family understanding of illness & functional limitations: Patient and family aware that patient's stroke likely, directly related to her ongoing substance abuse. Premorbid pt/family roles/activities: Patient was working as a Freight forwarder at [employer]. Anticipated changes in roles/activities/participation: Her goals of supervision, boyfriend will assume primary caregiver role with intermittent "backup" from patient's mother. Pt/family expectations/goals: Patient hopeful for short LOS.  Community Resources [employer]: Other (Comment)(Drug addiction programs) Transportation available at discharge:  Yes Resource referrals recommended: Neuropsychology, Advocacy groups  Discharge Planning Living Arrangements: Other (Comment)(Boyfriend) Support Systems: Other (Comment), Parent(Boyfriend) Type of Residence: Private residence Insurance Resources: Multimedia programmer (specify)([employer]) Financial Resources: Employment Financial Screen Referred: No Living Expenses: Rent Money Management: Other (Comment), Patient(Boyfriend) Does the patient have any problems obtaining your medications?: No(Compliance) Home Management: Patient and boyfriend living together Patient/Family Preliminary Plans: Patient to return home with boyfriend to [facility].  Plan for patient to stay with mother when boyfriend is working. Social Work Anticipated Follow Up Needs: HH/OP Expected length of stay: 7 to 10 days  Clinical Impression Very unfortunate, young woman here following another medical event related to her ongoing substance abuse.  She speaks very openly and easily with me about her SA issues.  Familiar to this social work and [employer] neuropsychologist from prior [place] stay in Jun 03, 2016.  Patient reports her boyfriend is to be primary caregiver with intermittent support from her mother.  Will, again, attempt to address SA issues as well as deficits from recent stroke.  Mateja Dier 02/18/2019, 1:34 PM

## 2019-02-20 NOTE — Progress Notes (Signed)
[facility] PHYSICAL MEDICINE & REHABILITATION PROGRESS NOTE   Subjective/Complaints:  Still having headaches.  Mainly frontal both sides as well as temporal areas.  No visual changes. Also complaining of some increasing tone of left upper limb and left lower limb mainly bothering her at night this does interfere with sleep  ROS: Patient deniesnausea, vomiting, diarrhea, cough, shortness of breath or chest pain, joint or back pain,  or mood change.    Objective:   No results found. No results for input(s): WBC, HGB, HCT, PLT in the last 72 hours. No results for input(s): NA, K, CL, CO2, GLUCOSE, BUN, CREATININE, CALCIUM in the last 72 hours.  Intake/Output Summary (Last 24 hours) at 02/20/2019 1502 Last data filed at 02/20/2019 0851 Gross per 24 hour  Intake 360 ml  Output --  Net 360 ml     Physical Exam: Vital Signs Blood pressure (!) 94/50, pulse 60, temperature 98.2 F (36.8 C), resp. rate 15, last menstrual period 01/31/2019, SpO2 100 %. Constitutional: No distress . Vital signs reviewed. Frail appearing HEENT: EOMI, oral membranes moist Neck: supple Cardiovascular: RRR without murmur. No JVD    Respiratory: CTA Bilaterally without wheezes or rales. Normal effort    GI: BS +, non-tender, non-distended  Musculoskeletal:     Comments: No edema or tenderness in extremities  Neurological: She is alert.  Follows basic commands.   Oriented x3.   Motor: RUE/RLE: 5/5 proximal to distal LUE: Trace proximal to distal LLE: HF, KE 2/5, ADF trace/5- APF 1 to 1+/5. Heel cord tight Tone: MAS 1 LLE and 1+ LUE , no clonus noted in the left upper or left lower limb. Left neglect noted Skin: Skin is warm and dry.  Psychiatric: flat but cooperative and pleasant    Assessment/Plan: 1. Functional deficits secondary to right MCA infarction which require 3+ hours per day of interdisciplinary therapy in a comprehensive inpatient rehab setting.  Physiatrist is providing close team  supervision and 24 hour management of active medical problems listed below.  Physiatrist and rehab team continue to assess barriers to discharge/monitor patient progress toward functional and medical goals  Care Tool:  Bathing    Body parts bathed by patient: Left arm, Chest, Abdomen, Front perineal area, Buttocks, Right upper leg, Left upper leg, Face   Body parts bathed by helper: Left lower leg, Right lower leg, Right arm     Bathing assist Assist Level: Moderate Assistance - Patient 50 - 74%     Upper Body Dressing/Undressing Upper body dressing   What is the patient wearing?: Pull over shirt, Bra    Upper body assist Assist Level: Moderate Assistance - Patient 50 - 74%    Lower Body Dressing/Undressing Lower body dressing      What is the patient wearing?: Pants, Underwear/pull up     Lower body assist Assist for lower body dressing: Moderate Assistance - Patient 50 - 74%     Toileting Toileting Toileting Activity did not occur Press photographer and hygiene only): N/A (no void or bm)  Toileting assist Assist for toileting: Minimal Assistance - Patient > 75%     Transfers Chair/bed transfer  Transfers assist     Chair/bed transfer assist level: Contact Guard/Touching assist     Locomotion Ambulation   Ambulation assist      Assist level: Moderate Assistance - Patient 50 - 74% Assistive device: No Device Max distance: 100 ft   Walk 10 feet activity   Assist     Assist level:  Moderate Assistance - Patient - 50 - 74% Assistive device: No Device   Walk 50 feet activity   Assist    Assist level: Moderate Assistance - Patient - 50 - 74% Assistive device: No Device    Walk 150 feet activity   Assist Walk 150 feet activity did not occur: Safety/medical concerns  Assist level: Minimal Assistance - Patient > 75% Assistive device: No Device    Walk 10 feet on uneven surface  activity   Assist     Assist level: 2 helpers      Wheelchair     Assist Will patient use wheelchair at discharge?: No             Wheelchair 50 feet with 2 turns activity    Assist            Wheelchair 150 feet activity     Assist          Blood pressure (!) 94/50, pulse 60, temperature 98.2 F (36.8 C), resp. rate 15, last menstrual period 01/31/2019, SpO2 100 %.  Medical Problem List and Plan: 1.  Left side weakness secondary to right MCA infarction as well as history of hypoxic encephalopathy after drug overdose 2018 requiring tracheostomy and received inpatient rehab services             -patient may may shower             -ELOS/Goals: 4-7 days/Mod I/Supervision             -Continue CIR therapies including PT, OT  1/8 -ordered left resting WHO and PRAFO   Increasing tone mainly problematic at night start tizanidine 2 mg nightly 2.  Antithrombotics: -DVT/anticoagulation: Subcutaneous Lovenox             -antiplatelet therapy: Aspirin 81 mg daily and Plavix 75 mg daily until 03/02/2019 and then aspirin alone 3. Pain Management: Tylenol as needed  Increase Topamax to 50 mg twice daily  1/8 -spastic left hemiparesis---ROM/splinting for now.  No painful tone noted. 4. Mood: Seroquel 25 mg every morning and 100 mg nightly, Prozac 25 mg daily.                           Emotional support             -antipsychotic agents: N/A 5. Neuropsych: This patient is capable of making decisions on her own behalf. 6. Skin/Wound Care: Routine skin checks 7. Fluids/Electrolytes/Nutrition: encourage PO  1/7 -BUN sl elevated  Continue to encourage fluids.     8.  Polysubstance abuse as well as tobacco abuse.  Provide counseling  -will need intensive outpt follow up given history 9.  Hyperlipidemia.  Continue Lipitor    LOS: 4 days A FACE TO FACE EVALUATION WAS PERFORMED  Charlett Blake 02/20/2019, 3:02 PM

## 2019-02-20 NOTE — Care Management (Signed)
Inpatient Rehabilitation Center Individual Statement of Services  Patient Name:  Aariona Momon  Date:  02/20/2019  Welcome to the Inpatient Rehabilitation Center.  Our goal is to provide you with an individualized program based on your diagnosis and situation, designed to meet your specific needs.  With this comprehensive rehabilitation program, you will be expected to participate in at least 3 hours of rehabilitation therapies Monday-Friday, with modified therapy programming on the weekends.  Your rehabilitation program will include the following services:  Physical Therapy (PT), Occupational Therapy (OT), Speech Therapy (ST), 24 hour per day rehabilitation nursing, Therapeutic Recreaction (TR), Neuropsychology, Case Management (Social Worker), Rehabilitation Medicine, Nutrition Services and Pharmacy Services  Weekly team conferences will be held on Tuesdays to discuss your progress.  Your Social Worker will talk with you frequently to get your input and to update you on team discussions.  Team conferences with you and your family in attendance may also be held.  Expected length of stay: 7-10 days   Overall anticipated outcome: supervision  Depending on your progress and recovery, your program may change. Your Social Worker will coordinate services and will keep you informed of any changes. Your Social Worker's name and contact numbers are listed  below.  The following services may also be recommended but are not provided by the Inpatient Rehabilitation Center:   Driving Evaluations  Home Health Rehabiltiation Services  Outpatient Rehabilitation Services  Vocational Rehabilitation   Arrangements will be made to provide these services after discharge if needed.  Arrangements include referral to agencies that provide these services.  Your insurance has been verified to be:  [facility] Your primary doctor is:  none  Pertinent information will be shared with your doctor and your insurance  company.  Social Worker:  [place], [place] [phone] or ([phone]   Information discussed with and copy given to patient by: Amada Jupiter, 02/20/2019, 1:39 PM

## 2019-02-20 NOTE — Progress Notes (Signed)
[name] questioning transfers in room. Wanted to verify if had permission to toilet pt. Informed him that notes only indication transfer to and from wc to bed with AFO use. Education provided in relation to toilet but doesn't say that has been cleared. Will need to follow up with therapy for continued training at this time.

## 2019-02-21 ENCOUNTER — Inpatient Hospital Stay (HOSPITAL_COMMUNITY): Payer: 59

## 2019-02-21 ENCOUNTER — Inpatient Hospital Stay (HOSPITAL_COMMUNITY): Payer: 59 | Admitting: Occupational Therapy

## 2019-02-21 ENCOUNTER — Encounter (HOSPITAL_COMMUNITY): Payer: 59 | Admitting: Psychology

## 2019-02-21 DIAGNOSIS — F0630 Mood disorder due to known physiological condition, unspecified: Secondary | ICD-10-CM

## 2019-02-21 MED ORDER — QUETIAPINE FUMARATE 50 MG PO TABS
50.0000 mg | ORAL_TABLET | Freq: Every day | ORAL | Status: DC
  Administered 2019-02-21: 21:00:00 50 mg via ORAL
  Filled 2019-02-21: qty 1

## 2019-02-21 MED ORDER — TIZANIDINE HCL 2 MG PO TABS
2.0000 mg | ORAL_TABLET | Freq: Two times a day (BID) | ORAL | Status: DC
  Administered 2019-02-21 – 2019-02-28 (×14): 2 mg via ORAL
  Filled 2019-02-21 (×14): qty 1

## 2019-02-21 NOTE — Plan of Care (Signed)
  Problem: Consults Goal: RH STROKE PATIENT EDUCATION Description: See Patient Education module for education specifics  Outcome: Progressing   Problem: RH BOWEL ELIMINATION Goal: RH STG MANAGE BOWEL WITH ASSISTANCE Description: STG Manage Bowel with min Assistance. Outcome: Progressing Goal: RH STG MANAGE BOWEL W/MEDICATION W/ASSISTANCE Description: STG Manage Bowel with Medication with min Assistance. Outcome: Progressing   Problem: RH BLADDER ELIMINATION Goal: RH STG MANAGE BLADDER WITH ASSISTANCE Description: STG Manage Bladder With min Assistance Outcome: Progressing Goal: RH STG MANAGE BLADDER WITH EQUIPMENT WITH ASSISTANCE Description: STG Manage Bladder With Equipment With min Assistance Outcome: Progressing   Problem: RH SKIN INTEGRITY Goal: RH STG SKIN FREE OF INFECTION/BREAKDOWN Description: Skin will be free of infection/breakdown with min assistance Outcome: Progressing Goal: RH STG MAINTAIN SKIN INTEGRITY WITH ASSISTANCE Description: STG Maintain Skin Integrity With min Assistance. Outcome: Progressing   Problem: RH SAFETY Goal: RH STG ADHERE TO SAFETY PRECAUTIONS W/ASSISTANCE/DEVICE Description: STG Adhere to Safety Precautions With min Assistance/Device. Outcome: Progressing   Problem: RH COGNITION-NURSING Goal: RH STG ANTICIPATES NEEDS/CALLS FOR ASSIST W/ASSIST/CUES Description: STG Anticipates Needs/Calls for Assist With min Assistance/Cues. Outcome: Progressing   Problem: RH PAIN MANAGEMENT Goal: RH STG PAIN MANAGED AT OR BELOW PT'S PAIN GOAL Description: Less than 2 out of 10 Outcome: Progressing   Problem: RH KNOWLEDGE DEFICIT Goal: RH STG INCREASE KNOWLEDGE OF STROKE PROPHYLAXIS Description: Pt will be able to name stroke prophylaxis  Outcome: Progressing

## 2019-02-21 NOTE — Progress Notes (Signed)
Occupational Therapy Session Note  Patient Details  Name: Jane Watkins MRN: [id number] Date of Birth: 07-Feb-1996  Today's Date: 02/21/2019 OT Individual Time: 1015-1100 OT Individual Time Calculation (min): 45 min    Short Term Goals: Week 1:  OT Short Term Goal 1 (Week 1): LTG=STG 2/2 ELOS  Skilled Therapeutic Interventions/Progress Updates:    Pt seen for ADL training to include shower and dressing.  Pt received in w/c ready for therapy.  She picked out her clothing from drawers and undressed with min A using hemidressing techniques.  Stand pivot in and out of shower with min A/CGA.  Using her long loofah sponge, pt was able to wash her feet and R arm with sponge handle in R hand.  Stood to wash bottom with CGA.  Dressed from wc using hemi dressing strategies following cues well with min A.   Set pt up with lap tray and encouraged her to work on self ROM. Belt alarm on and all needs met.   Therapy Documentation Precautions:  Precautions Precautions: Fall Precaution Comments: L hemi Restrictions Weight Bearing Restrictions: No     Pain: Pain Assessment Pain Score: 0-No pain ADL: ADL Eating: Set up Grooming: Minimal assistance Upper Body Bathing: Moderate assistance Lower Body Bathing: Moderate assistance Upper Body Dressing: Moderate assistance Lower Body Dressing: Maximal assistance Toileting: Minimal assistance Toilet Transfer: Moderate assistance Tub/Shower Transfer: Moderate assistance   Therapy/Group: Individual Therapy  [name] 02/21/2019, 12:16 PM

## 2019-02-21 NOTE — Progress Notes (Signed)
Physical Therapy Session Note  Patient Details  Name: Jane Watkins MRN: [id number] Date of Birth: Jul 05, 1995  Today's Date: 02/21/2019 PT Individual Time: 0800-0905 PT Individual Time Calculation (min): 65 min   Short Term Goals: Week 1:  PT Short Term Goal 1 (Week 1): = LTGs overall supervision      Skilled Therapeutic Interventions/Progress Updates:   Pt asleep, and slow to wake up to touch and PT stating her name.  She stated that she had a HA, unrated, and used call bell with urging to ask for pain meds.  [name], LPN administered meds at start of session.  neuromuscular re-education via multimodal cues for L hip rotation, L heel cord stretch in supine.  Pt stated that she needed to use toilet.  Supine> sit to L with min assist.  Sit> stand with CGA, to pivot with min assist.  Toilet transfer with CGA and use of wall bar.  Pt continent of urine. Sustained stretch and balance challenge, standing on red wedge without UE support, min assist for balance, x 2 minutes.  Hand washing at sink from w/c level, set up.  PT set pt up for breakfast in w/c.  Min assist for containers and set up for use of L hand for gross assist.  Reciprocal scooting in unsupported sitting for L pelvic activation, forward with tactile cues, backwards with min assist for L pelvis.  Seated activity without LE support, reaching out of BOS to R with R hand to retrieve small items, to facilitate L trunk shortening, and retrieving small items from under R thigh with R hand, to facilitate L trunk lengthening; L rotation to place q item next to L hip.  Pt education initiated for L hamstring stretching in sitting, using foot stool under L foot.   Stand pivot transfer to L with CGA, cues for LLE movements.  At end of session, pt seated in w/c with needs at hand and seat belt alarm set. Lap tray under LUE.     Therapy Documentation Precautions:  Precautions Precautions: Fall Precaution Comments: L  hemi Restrictions Weight Bearing Restrictions: No       Therapy/Group: Individual Therapy  Aliyha Fornes 02/21/2019, 9:30 AM

## 2019-02-21 NOTE — Consult Note (Signed)
Neuropsychological Consultation   Patient:   Jane Watkins   DOB:   Dec 29, 1995  MR Number:  [id number]  Location:  [facility] [address] CENTER A [id number] [place] [address] Dept: [phone] Loc: [phone]           Date of Service:   02/21/2019  Start Time:   9 AM End Time:   10 AM  Provider/Observer:  Arley Phenix, Psy.D.       Clinical Neuropsychologist       Billing Code/Service: [phone]  Chief Complaint:    Jane Watkins is a 24 year old left-handed female with a history of IV drug use, polysubstance/tobacco abuse, anxiety and depression.  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  The patient has a long history of opiate abuse, benzodiazepine abuse, cocaine use and marijuana use.  The patient presented on 02/07/2019 after being found unresponsive.  EMS was contacted and the patient received Narcan with some improvement in mental status.  Urine drug screen was positive for cocaine, marijuana and benzodiazepines although the patient was also potentially using opiates as well.  Cranial CT scan showed areas of distinct low-density in the right basal ganglia with mass-effect on the right lateral ventricle.  Patient did not receive TPA.  MRI showed acute infarct of right MCA territory affecting the basal ganglia and affecting the cortical and subcortical brain in a largely watershed type distribution.  The patient has had psychiatric consult due to issues of depression and also is being followed outpatient by psychiatry.  Reason for Service:  Patient referred for neuropsychological consultation due to coping and adjustment issues due to residual effects of her CVA as well as history of polysubstance abuse.  Below is the HPI for the current admission.  HPI: Jane Watkins is a 24 year old left-handed female  with history of IV drug use, polysubstance/tobacco abuse, anxiety.  History taken from chart review, patient, and boyfriend. Patient on no prescription medications at time of admission.  Patient lives with her boyfriend.  She reportedly works at [employer].  Independent prior to admission.  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  She was discharged home with family ambulating extended distances with a rolling walker.  Presented 02/07/2019 after being found unresponsive. EMS was contacted the patient received Narcan with some improvement in mental status.  Admission labs with WBC 20,700, potassium 5.2, BUN 25, creatinine 0.67, urine drug screen positive cocaine, marijuana and benzodiazepines, alcohol level less than 10, SARS coronavirus negative, blood cultures no growth to date.  Cranial CT scan showed areas of indistinct low-density in the right basal ganglia with mass-effect on the right lateral ventricle.  Patient did not receive TPA.  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  CTA of head and neck showed no major arterial occlusion or significant stenosis.  Echocardiogram with EF of 60%. Neurology follow-up currently maintained on aspirin and Plavix for CVA prophylaxis x3 weeks then aspirin alone with Plavix to be completed 03/02/2019.  Subcutaneous Lovenox for DVT prophylaxis.  Psychiatry consulted for depression and maintained on Seroquel as well as Prozaac.  Tolerating a regular diet.  Therapy evaluations completed and patient was admitted for a comprehensive rehab program. Please  see preadmission assessment from earlier today as well.  Current Status:  The patient was somewhat slowed in her response time today but was aware and oriented x4.  The patient acknowledged  her history of substance abuse and was aware of her previous hospitalization and rehab stay.  Patient acknowledged her long-term polysubstance abuse and reports that she had been using cocaine, street manufactured and obtained benzo diazepam as well as opiate abuse but she is not sure whether or not she had used opiates at the time of her stroke.  The patient had previously been told that her urine drug screen was not positive for opiates however it is unclear whether this was actually looked at initially.  The patient acknowledges a long history of substance abuse including substance abuse going back to when she was 76 or 24 years of age.  The patient reports that there is a family history of bipolar disorder and feels like she likely has bipolar disorder.  The patient reports times of anxiety and significant depression that she has ultimately been self-medicating with substance abuse for many years now.  The patient reports that she has been through many difficult and stressful situations including the death of her boyfriend back in 2008 when she was brought in herself for overdose.  Behavioral Observation: Jane Watkins  presents as a 24 y.o.-year-old Left Caucasian Female who appeared her stated age. her dress was Appropriate and she was Well Groomed and her manners were Appropriate to the situation.  her participation was indicative of Appropriate and Drowsy behaviors.  There were any physical disabilities noted.  she displayed an appropriate level of cooperation and motivation.     Interactions:    Active Appropriate and Drowsy  Attention:   abnormal and attention span appeared shorter than expected for age  Memory:   within normal limits; recent and remote memory intact  Visuo-spatial:  not examined  Speech (Volume):  low  Speech:   normal; slowed response time  Thought Process:  Coherent and Relevant  Though Content:  WNL; not suicidal and not homicidal  Orientation:   person, place,  time/date and situation  Judgment:   Poor  Planning:   Poor  Affect:    Anxious and Depressed  Mood:    Anxious, Depressed and Dysphoric  Insight:   Fair  Intelligence:   normal  Substance Use:  There is a documented history of alcohol, cocaine, heroin, marijuana, prescription drug and tobacco abuse confirmed by the patient.    Education:   HS Graduate  Medical History:   Past Medical History:  Diagnosis Date  . AKI (acute kidney injury) (HCC) 2018   "from overdose"  . Anxiety   . Daily headache   . Depression   . Drug overdose 04/2016   [name] 05/01/2016  . GERD (gastroesophageal reflux disease)    "when I was younger; gone now" (09/21/2017)  . Migraine    "q couple weeks" (09/21/2017)  . Overdose         Abuse/Trauma History: The patient acknowledged history of traumatic experiences but did not go into detail today.  Psychiatric History:  The patient has a history of anxiety and depression reports that she feels like she may have bipolar disorder.  She reports that her mother has been diagnosed with bipolar disorder.  She reports that she is largely self medicated for her mood disorder and depression with substance abuse.  The patient has done some drug rehabilitation efforts in the past but acknowledges she  is not always been a committed participant.  Family Med/Psych History:  Family History  Problem Relation Age of Onset  . Anemia Father     Risk of Suicide/Violence: The patient reports that she does not feel like her recent substance use and episode where she had a stroke was related to any type of attempts to do self-harm.  She reports that her boyfriend has voiced concerns about what led up to her recent CVA.  Impression/DX:  Jane Watkins is a 24 year old left-handed female with a history of IV drug use, polysubstance/tobacco abuse, anxiety and depression.  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  The patient has a long history of opiate abuse, benzodiazepine abuse, cocaine use and marijuana use.  The patient presented on 02/07/2019 after being found unresponsive.  EMS was contacted and the patient received Narcan with some improvement in mental status.  Urine drug screen was positive for cocaine, marijuana and benzodiazepines although the patient was also potentially using opiates as well.  Cranial CT scan showed areas of distinct low-density in the right basal ganglia with mass-effect on the right lateral ventricle.  Patient did not receive TPA.  MRI showed acute infarct of right MCA territory affecting the basal ganglia and affecting the cortical and subcortical brain in a largely watershed type distribution.  The patient has had psychiatric consult due to issues of depression and also is being followed outpatient by psychiatry.  The patient was somewhat slowed in her response time today but was aware and oriented x4.  The patient acknowledged her history of substance abuse and was aware of her previous hospitalization and rehab stay.  Patient acknowledged her long-term polysubstance abuse and reports that she had been using cocaine, street manufactured and obtained benzo diazepam as well as opiate abuse but she is not sure whether or not she had used opiates at the time of her stroke.  The patient had previously been told that her urine drug screen was not positive for opiates however it is unclear whether this was actually looked at initially.  The patient acknowledges a long history of substance abuse including substance abuse going back to when she was 34 or 24 years of age.  The patient reports that there is a family history of bipolar disorder and feels like she likely has bipolar disorder.  The patient reports times of anxiety and significant depression that she has ultimately been self-medicating with substance abuse for many years now.  The patient  reports that she has been through many difficult and stressful situations including the death of her boyfriend back in 2008 when she was brought in herself for overdose.   Disposition/Plan:  Will follow up with the patient later this week although I do not suspect her to be on the unit for that long.  The patient acknowledges significant depression but she is being managed psycho pharmacologically as the patient is taking Seroquel at night and a low dose in the morning as well as Prozac.  Diagnosis:    Right middle cerebral artery stroke [facility]) - Plan: Ambulatory referral to Neurology      Polysubstance abuse, history of significant depression anxiety and likely underlying mood disorder.  Electronically Signed   _______________________ Ilean Skill, Psy.D.

## 2019-02-21 NOTE — Progress Notes (Signed)
Occupational Therapy Session Note  Patient Details  Name: Jane Watkins MRN: [id number] Date of Birth: [birth year]  Today's Date: 02/21/2019 OT Individual Time: 1401-1500 OT Individual Time Calculation (min): 59 min   Short Term Goals: Week 1:  OT Short Term Goal 1 (Week 1): LTG=STG 2/2 ELOS    Skilled Therapeutic Interventions/Progress Updates:    Pt greeted in bed, agreeable to session and requesting to use the bathroom. Boyfriend [name] assisted pt with bed mobility and donning shoes including Lt AFO. He safely set up her w/c and provided stand pivot assist<w/c<toilet. Discussed safety with clothing mgt and hygiene with [name] exhibiting good understanding. Cleared him off on safety plan to assist pt with toilet transfers + toileting. RN made aware. While sitting at the sink, pt required HOH to integrate L UE during handwashing and oral care. Afterwards provided pt with a self ROM packet, reviewed exercise techniques with both pt and [name]. [name] had hands on practice assisting her with stretches. Emphasized gentle stretching in end ranges, avoidance of pain, and joint protection. [name] stated that her nighttime splint was too big for her, so OT relayed this to RN to order a better fitting splint. Also looked for an arm trough with none in supply. Left pt via PT handoff at end of session.   After PT session, Saebo Stim One applied to Lt wrist extensors for 60 minutes with boyfriend [place] present. Checked on pt within 1st 30 minutes to assess tolerance, with pt requesting an additional 30 minutes of e-stim. OT returned after 60 minutes total of e-stim to remove pads. Skin intact with no redness, pt reported she tolerated it well. Saebo Stim One parameters detailed below.   Saebo Stim One 330 pulse width 35 Hz pulse rate On 8 sec/ off 8 sec Ramp up/ down 2 sec Symmetrical Biphasic wave form  Max intensity at 500 Ohm load  Therapy Documentation Precautions:  Precautions Precautions:  Fall Precaution Comments: L hemi Restrictions Weight Bearing Restrictions: No Pain: HA at end of session, notified RN that she'd like some pain medicine. OT provided lavender essential oil cotton balls inside of her pillowcase to address via aromatherapy interventions.    ADL: ADL Eating: Set up Grooming: Minimal assistance Upper Body Bathing: Moderate assistance Lower Body Bathing: Moderate assistance Upper Body Dressing: Moderate assistance Lower Body Dressing: Maximal assistance Toileting: Minimal assistance Toilet Transfer: Moderate assistance Tub/Shower Transfer: Moderate assistance      Therapy/Group: Individual Therapy  Pattrick Bady A Yasmina Chico 02/21/2019, 4:19 PM

## 2019-02-21 NOTE — Progress Notes (Signed)
[facility] PHYSICAL MEDICINE & REHABILITATION PROGRESS NOTE   Subjective/Complaints:  C/o of ongoing headaches and spasticity on left side. topamax helps but lasts only for a few hours  ROS: Limited due to cognitive/behavioral    Objective:   No results found. No results for input(s): WBC, HGB, HCT, PLT in the last 72 hours. No results for input(s): NA, K, CL, CO2, GLUCOSE, BUN, CREATININE, CALCIUM in the last 72 hours.  Intake/Output Summary (Last 24 hours) at 02/21/2019 1055 Last data filed at 02/20/2019 2100 Gross per 24 hour  Intake 120 ml  Output --  Net 120 ml     Physical Exam: Vital Signs Blood pressure (!) 96/57, pulse 67, temperature 97.6 F (36.4 C), resp. rate 15, last menstrual period 01/31/2019, SpO2 100 %. Constitutional: No distress . Vital signs reviewed. HEENT: EOMI, oral membranes moist Neck: supple Cardiovascular: RRR without murmur. No JVD    Respiratory: CTA Bilaterally without wheezes or rales. Normal effort    GI: BS +, non-tender, non-distended  Musculoskeletal:     Comments: No edema or tenderness in extremities  Neurological: She is alert.  Follows basic commands.   Oriented x3.   Motor: RUE/RLE: 5/5 proximal to distal LUE: Trace proximal to distal LLE: HF, KE 2/5, ADF trace/5- APF 1 to 1+/5. Heel cord tight Tone: MAS 2 LLE gastroc/hamstrings, and 1+ LUE biceps , no clonus noted in the left upper or left lower limb. Left neglect noted Skin: Skin is warm and dry.  Psychiatric: very flat    Assessment/Plan: 1. Functional deficits secondary to right MCA infarction which require 3+ hours per day of interdisciplinary therapy in a comprehensive inpatient rehab setting.  Physiatrist is providing close team supervision and 24 hour management of active medical problems listed below.  Physiatrist and rehab team continue to assess barriers to discharge/monitor patient progress toward functional and medical goals  Care Tool:  Bathing    Body  parts bathed by patient: Left arm, Chest, Abdomen, Front perineal area, Buttocks, Right upper leg, Left upper leg, Face   Body parts bathed by helper: Left lower leg, Right lower leg, Right arm     Bathing assist Assist Level: Moderate Assistance - Patient 50 - 74%     Upper Body Dressing/Undressing Upper body dressing   What is the patient wearing?: Pull over shirt, Bra    Upper body assist Assist Level: Moderate Assistance - Patient 50 - 74%    Lower Body Dressing/Undressing Lower body dressing      What is the patient wearing?: Pants, Underwear/pull up     Lower body assist Assist for lower body dressing: Moderate Assistance - Patient 50 - 74%     Toileting Toileting Toileting Activity did not occur Landscape architect and hygiene only): N/A (no void or bm)  Toileting assist Assist for toileting: Minimal Assistance - Patient > 75%     Transfers Chair/bed transfer  Transfers assist     Chair/bed transfer assist level: Minimal Assistance - Patient > 75%     Locomotion Ambulation   Ambulation assist      Assist level: Moderate Assistance - Patient 50 - 74% Assistive device: No Device Max distance: 100 ft   Walk 10 feet activity   Assist     Assist level: Moderate Assistance - Patient - 50 - 74% Assistive device: No Device   Walk 50 feet activity   Assist    Assist level: Moderate Assistance - Patient - 50 - 74% Assistive device: No Device  Walk 150 feet activity   Assist Walk 150 feet activity did not occur: Safety/medical concerns  Assist level: Minimal Assistance - Patient > 75% Assistive device: No Device    Walk 10 feet on uneven surface  activity   Assist     Assist level: 2 helpers     Wheelchair     Assist Will patient use wheelchair at discharge?: No             Wheelchair 50 feet with 2 turns activity    Assist            Wheelchair 150 feet activity     Assist          Blood  pressure (!) 96/57, pulse 67, temperature 97.6 F (36.4 C), resp. rate 15, last menstrual period 01/31/2019, SpO2 100 %.  Medical Problem List and Plan: 1.  Left side weakness secondary to right MCA infarction as well as history of hypoxic encephalopathy after drug overdose 2018 requiring tracheostomy and received inpatient rehab services             -patient may may shower             -ELOS/Goals: 4-7 days/Mod I/Supervision             -Continue CIR therapies including PT, OT  -continue left resting WHO and PRAFO      2.  Antithrombotics: -DVT/anticoagulation: Subcutaneous Lovenox             -antiplatelet therapy: Aspirin 81 mg daily and Plavix 75 mg daily until 03/02/2019 and then aspirin alone 3. Pain Management: Tylenol as needed  1/10 Increased Topamax to 50 mg twice daily---observe  1/8 -spastic left hemiparesis---ROM/splinting  1/11: continue tizanidine. Add day time dosing also 4. Mood: Seroquel 25 mg every morning and 100 mg nightly, Prozac 25 mg daily.                           Emotional support             -antipsychotic agents: N/A 5. Neuropsych: This patient is capable of making decisions on her own behalf. 6. Skin/Wound Care: Routine skin checks 7. Fluids/Electrolytes/Nutrition: encourage PO  1/7 -BUN sl elevated  Continue to encourage fluids. ---recheck labs tomorrow    8.  Polysubstance abuse as well as tobacco abuse.  Provide counseling  -will need intensive outpt follow up given history 9.  Hyperlipidemia.  Continue Lipitor    LOS: 5 days A FACE TO FACE EVALUATION WAS PERFORMED  Ranelle Oyster 02/21/2019, 10:55 AM

## 2019-02-21 NOTE — Progress Notes (Signed)
Physical Therapy Session Note  Patient Details  Name: Jane Watkins MRN: [id number] Date of Birth: 1995-12-06  Today's Date: 02/21/2019 PT Individual Time: 1505-1540 PT Individual Time Calculation (min): 35 min   Short Term Goals: Week 1:  PT Short Term Goal 1 (Week 1): = LTGs overall supervision  Skilled Therapeutic Interventions/Progress Updates:     Patient in w/c with her boyfriend in the room upon PT arrival. Patient alert and agreeable to PT session. Patient reported 7-8/10 headache pain during session, RN made aware and provided pain medicine during session. PT provided repositioning, rest breaks, and distraction as pain interventions throughout session. Adjusted AFO in new, larger sized tennis shoes with total A for time management. OT came in to apply e-stim unit to wrist extensors during session.   Therapeutic Activity: Bed Mobility: Patient performed sit to supine with min A for L LE managment. Provided verbal cues for scooping L LE with R, required manual facilitation of placement of L foot on R . Transfers: Patient performed sit to/from stand x2 and a toilet transfer x1 with min A-CGA using a RW with L hand splint. Provided verbal cues for and hand-over hand assist for L hand placement in splint.  Gait Training:  Patient ambulated 16 feet to/from the bathroom using RW with L hand splint and L AFO with CGA-min A for safety/balance. Ambulated with step-to progressing to step-through leading with R, decreased knee extension in stance on L, and decreased step length on L. Provided verbal cues for L quad activation and increased step height.  Neuromuscular Re-ed: E-stim unit turned off for NMR activities and turned back on after. Educated patient and her boyfriend on managing unit.  Patient performed standing balance with weight shifts to L with L UE support only for increased weight bearing in L UE and LE focused on L quad activation and shoulder activation with approximation to shoulder  and hand for increased proprioceptive feedback x2 min. Performed shoulder elevation with manual facilitation using B shoulders and elbow flexion/extension with PNF tapping for increased motor activation.  Patient in bed with B tennis shoes and L AFO doffed with total A at end of session with her boyfriend in the room with breaks locked and all needs within reach.    Therapy Documentation Precautions:  Precautions Precautions: Fall Precaution Comments: L hemi Restrictions Weight Bearing Restrictions: No    Therapy/Group: Individual Therapy  Jane Watkins PT, DPT  02/21/2019, 3:56 PM

## 2019-02-22 ENCOUNTER — Inpatient Hospital Stay (HOSPITAL_COMMUNITY): Payer: 59 | Admitting: Occupational Therapy

## 2019-02-22 ENCOUNTER — Inpatient Hospital Stay (HOSPITAL_COMMUNITY): Payer: 59 | Admitting: Physical Therapy

## 2019-02-22 DIAGNOSIS — F331 Major depressive disorder, recurrent, moderate: Secondary | ICD-10-CM

## 2019-02-22 MED ORDER — FLUOXETINE HCL 20 MG PO CAPS
40.0000 mg | ORAL_CAPSULE | Freq: Every day | ORAL | Status: DC
  Administered 2019-02-23 – 2019-02-28 (×6): 40 mg via ORAL
  Filled 2019-02-22 (×6): qty 2

## 2019-02-22 MED ORDER — QUETIAPINE FUMARATE 50 MG PO TABS
75.0000 mg | ORAL_TABLET | Freq: Every day | ORAL | Status: DC
  Administered 2019-02-22 – 2019-02-27 (×6): 75 mg via ORAL
  Filled 2019-02-22 (×6): qty 1

## 2019-02-22 NOTE — Progress Notes (Signed)
-*/  Occupational Therapy Session Note  Patient Details  Name: Jane Watkins MRN: [id number] Date of Birth: 02/04/1996  Today's Date: 02/22/2019 OT Individual Time: 1302-1402 OT Individual Time Calculation (min): 60 min    Short Term Goals: Week 1:  OT Short Term Goal 1 (Week 1): LTG=STG 2/2 ELOS  Skilled Therapeutic Interventions/Progress Updates:    Pt greeted on commode with boyfriend assisting pt. Pt completed stand-pivot back to wc after voiding with min A. Pt requested OT braid pt's hair. Pts boyfriend with questions regarding DME needs for home. Discussed process of getting most DME through case manager, but he could look into getting pt a tub transfer bench if he could find a better price. Pt brought down to tub room and OT demonstrated tub bench transfer with min A. Discussed getting HH shower hose as well. Pt brought to therapy gym and completed stand-pivot to therapy mat with min A. Brought pt into gravity eliminated sidelying position. Placed L UE in arm skate and OT provided joint input to facilitate elbow flex/ext and shoulder flex/ext. Pt with trace elbow flexion and trace scapula activation. Tone was better today as well. OT placed kinesiotape for shoulder sublux support. Pt requesting we start using hemi-walker in therapy and reports not liking the RW. OT informed pt to discuss this with PT. Pt completed stand-pivot back to wc with min A. OT returned pt to room and placed SAEBO e-stim. SAEBO left on for 60 minutes. OT returned to remove SAEBO with skin intact and no adverse reactions.  Saebo Stim One 330 pulse width 35 Hz pulse rate On 8 sec/ off 8 sec Ramp up/ down 2 sec Symmetrical Biphasic wave form  Max intensity at 500 Ohm load   Therapy Documentation Precautions:  Precautions Precautions: Fall Precaution Comments: L hemi Restrictions Weight Bearing Restrictions: No Pain: Pain Assessment Pain Score: 0-No pain   Therapy/Group: Individual Therapy  [name] 02/22/2019, 2:11 PM

## 2019-02-22 NOTE — Patient Care Conference (Signed)
Inpatient RehabilitationTeam Conference and Plan of Care Update Date: 02/22/2019   Time: 10:45 AM   Patient Name: Jane Watkins      Medical Record Number: [id number]  Date of Birth: 1995-11-29 Sex: Female         Room/Bed: 4W09C/4W09C-01 Payor Info: Payor: Advertising copywriter / Plan: Advertising copywriter OTHER / Product Type: *No Product type* /    Admit Date/Time:  02/16/2019  4:38 PM  Primary Diagnosis:  Right middle cerebral artery stroke [facility])  Patient Active Problem List   Diagnosis Date Noted  . Mood disorder in conditions classified elsewhere   . 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 Polysubstance abuse (HCC)   . Dyslipidemia   . Ischemic stroke diagnosed during current admission (HCC) 02/13/2019  . MDD (major depressive disorder), recurrent episode, moderate (HCC) 02/10/2019  . Dysphagia 02/09/2019  . Elevated CK 02/09/2019  . CVA (cerebral vascular accident) (HCC) 02/07/2019  . Cellulitis of arm 09/20/2017  . IV drug abuse (HCC) 09/20/2017  . Quadriceps weakness 06/18/2016  . TBI (traumatic brain injury) (HCC) 05/01/2016  . 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 Respiratory distress   . Glasgow coma scale total score 3-8 (HCC)   . Pneumonia of both lower lobes due to methicillin resistant Staphylococcus aureus (MRSA) (HCC)   . Acute pulmonary edema (HCC)   . Non-traumatic rhabdomyolysis   . Opioid abuse (HCC)   . Elevated troponin   . Acute respiratory failure with hypoxia (HCC)   . Drug overdose   . Elevated liver enzymes   . Encephalopathy acute   . Encephalopathy 04/19/2016  . Opiate overdose (HCC) 04/19/2016    Expected Discharge Date: Expected Discharge Date: 03/01/19  Team Members Present: Physician leading conference: Dr. Faith Rogue Social Worker Present: Amada Jupiter, LCSW Nurse Present: Doran Durand, LPN Case manager: Roderic Palau, RN PT Present: Aleda Grana, PT OT Present: Kearney Hard, OT SLP Present: Suzzette Righter,  CF-SLP PPS Coordinator present : Fae Pippin, [place], PT     Current Status/Progress Goal Weekly Team Focus  Bowel/Bladder   Continent  Remain continent  Continue to monitor   Swallow/Nutrition/ Hydration             ADL's   Min A overall, increased tone in L UE- using SAEBO e-stim  Supervision  L UE NMR, NMES, balance, self-care retraining, pt/family education   Mobility   CGA overall, gait with RW, AFO consult this week, poor neuromuscular control L LE during mobility  supervision<>CGA overall with LRAD  transfers, gait, balance, NMR, d/c planning, stairs, awareness   Communication             Safety/Cognition/ Behavioral Observations            Pain   Complains of migraines  Less occurence of migranes  Manage patients pain   Skin   No current issues  To continue to not have any new skin break down  Asses skin q shift    Rehab Goals Patient on target to meet rehab goals: Yes *See Care Plan and progress notes for long and short-term goals.     Barriers to Discharge  Current Status/Progress Possible Resolutions Date Resolved   Nursing                  PT                    OT  SLP                SW                Discharge Planning/Teaching Needs:  Home with boyfriend who, along with pt's mother, will provide 24/7 supervision.  Teaching needs still TBD   Team Discussion: L hemi, headaches, spasticity.  RN A+O, cont B/B, headache with tylenol, boyfriend concerned about ? Suicide attempt, will need OP f/u.  OT min A overall, more tone UE, ed with BF yesterday, very involved.  PT AFO consult, min/CGA overall, S goals.  Neuoropsych has seen.   Revisions to Treatment Plan: N/A     Medical Summary Current Status: right MCA infarct with left hemiparesis, drug overdose/suicide attempt, headaches, left-sided tone Weekly Focus/Goal: improve functional use and tone on left side. headache mgt, mood mgt/ego support  Barriers to Discharge:  Behavior;Medical stability       Continued Need for Acute Rehabilitation Level of Care: The patient requires daily medical management by a physician with specialized training in physical medicine and rehabilitation for the following reasons: Medical management of patient stability for increased activity during participation in an intensive rehabilitation regime.: Yes Analysis of laboratory values and/or radiology reports with any subsequent need for medication adjustment and/or medical intervention. : Yes   I attest that I was present, lead the team conference, and concur with the assessment and plan of the team.   Jodell Cipro M 02/22/2019, 8:02 PM   Team conference was held via web/ teleconference due to [place] - 19

## 2019-02-22 NOTE — Progress Notes (Signed)
Occupational Therapy Session Note  Patient Details  Name: Jane Watkins MRN: [id number] Date of Birth: [birth year]  Today's Date: 02/22/2019 OT Individual Time: 8333-8329 OT Individual Time Calculation (min): 60 min    Short Term Goals: Week 1:  OT Short Term Goal 1 (Week 1): LTG=STG 2/2 ELOS  Skilled Therapeutic Interventions/Progress Updates:    pt seen this session for ADL training - see documentation below. Excellent participation and pt is learning hemi plegic techniques for bathing and dressing well. Has tone in elbow and shoulder with full PROM. Checked her hand splint. The size small fits her well, reviewed with her how it should be put on her to avoid slipping.   Informed her PT that pt had on AFO and shoe before the session started and pt c/o that it was hurting her foot and felt too tight.  Doffed AFO and mild redness on heel and sides of foot.  Pt resting in wc with belt alarm on and all needs met.   Therapy Documentation Precautions:  Precautions Precautions: Fall Precaution Comments: L hemi Restrictions Weight Bearing Restrictions: No  Pain: Pain Assessment Pain Score: 0-No pain ADL: ADL Eating: Set up Grooming: Setup Where Assessed-Grooming: Sitting at sink Upper Body Bathing: Supervision/safety(using long handled sponge) Where Assessed-Upper Body Bathing: Shower Lower Body Bathing: Contact guard Where Assessed-Lower Body Bathing: Shower Upper Body Dressing: Minimal assistance Where Assessed-Upper Body Dressing: Wheelchair Lower Body Dressing: Moderate assistance Where Assessed-Lower Body Dressing: Wheelchair Toileting: Minimal assistance Toilet Transfer: Therapist, music Method: Arts development officer: Energy manager: Moderate assistance Social research officer, government: Curator Method: Radiographer, therapeutic: Grab bars, Shower seat with back   Therapy/Group: Individual  Therapy  Laquinta Hazell 02/22/2019, 12:51 PM

## 2019-02-22 NOTE — Progress Notes (Signed)
[place] PHYSICAL MEDICINE & REHABILITATION PROGRESS NOTE   Subjective/Complaints:  Feels that headaches are a little better. Sleep is an issue still. Spasms aren't as bad LUE and LLE  ROS: Patient denies fever, rash, sore throat, blurred vision, nausea, vomiting, diarrhea, cough, shortness of breath or chest pain, joint or back pain, headache, or mood change.   Objective:   No results found. No results for input(s): WBC, HGB, HCT, PLT in the last 72 hours. No results for input(s): NA, K, CL, CO2, GLUCOSE, BUN, CREATININE, CALCIUM in the last 72 hours.  Intake/Output Summary (Last 24 hours) at 02/22/2019 1112 Last data filed at 02/21/2019 2037 Gross per 24 hour  Intake 237 ml  Output --  Net 237 ml     Physical Exam: Vital Signs Blood pressure (!) 83/53, pulse 70, temperature 98.3 F (36.8 C), resp. rate 16, last menstrual period 01/31/2019, SpO2 99 %. Constitutional: No distress . Vital signs reviewed. HEENT: EOMI, oral membranes moist Neck: supple Cardiovascular: RRR without murmur. No JVD    Respiratory: CTA Bilaterally without wheezes or rales. Normal effort    GI: BS +, non-tender, non-distended  Musculoskeletal:     Comments: No edema or tenderness in extremities  Neurological: She is alert.  Follows basic commands.   Oriented x3.   Motor: RUE/RLE: 5/5 proximal to distal LUE: Trace proximal to distal LLE: HF, KE 2/5, ADF trace/5- APF 1 to 1+/5. Heel cord tight Tone: MAS 2 LLE gastroc/1+hamstrings, and 1+ LUE biceps , no clonus noted in the left upper or left lower limb. Left inattention.  Skin: Skin is warm and dry.  Psychiatric: more engaging, brighter today    Assessment/Plan: 1. Functional deficits secondary to right MCA infarction which require 3+ hours per day of interdisciplinary therapy in a comprehensive inpatient rehab setting.  Physiatrist is providing close team supervision and 24 hour management of active medical problems listed  below.  Physiatrist and rehab team continue to assess barriers to discharge/monitor patient progress toward functional and medical goals  Care Tool:  Bathing    Body parts bathed by patient: Left arm, Chest, Abdomen, Front perineal area, Buttocks, Right upper leg, Left upper leg, Face, Right arm, Right lower leg, Left lower leg(used long sponge)   Body parts bathed by helper: Left lower leg, Right lower leg, Right arm     Bathing assist Assist Level: Contact Guard/Touching assist     Upper Body Dressing/Undressing Upper body dressing   What is the patient wearing?: Pull over shirt, Bra    Upper body assist Assist Level: Minimal Assistance - Patient > 75%    Lower Body Dressing/Undressing Lower body dressing      What is the patient wearing?: Pants, Underwear/pull up     Lower body assist Assist for lower body dressing: Minimal Assistance - Patient > 75%     Toileting Toileting Toileting Activity did not occur (Clothing management and hygiene only): N/A (no void or bm)  Toileting assist Assist for toileting: Minimal Assistance - Patient > 75%     Transfers Chair/bed transfer  Transfers assist     Chair/bed transfer assist level: Minimal Assistance - Patient > 75%     Locomotion Ambulation   Ambulation assist      Assist level: Contact Guard/Touching assist Assistive device: Walker-rolling Max distance: 100 ft   Walk 10 feet activity   Assist     Assist level: Contact Guard/Touching assist Assistive device: Walker-rolling   Walk 50 feet activity   Assist  Assist level: Contact Guard/Touching assist Assistive device: Walker-rolling    Walk 150 feet activity   Assist Walk 150 feet activity did not occur: Safety/medical concerns  Assist level: Minimal Assistance - Patient > 75% Assistive device: No Device    Walk 10 feet on uneven surface  activity   Assist     Assist level: 2 helpers     Wheelchair     Assist Will  patient use wheelchair at discharge?: No             Wheelchair 50 feet with 2 turns activity    Assist            Wheelchair 150 feet activity     Assist          Blood pressure (!) 83/53, pulse 70, temperature 98.3 F (36.8 C), resp. rate 16, last menstrual period 01/31/2019, SpO2 99 %.  Medical Problem List and Plan: 1.  Left side weakness secondary to right MCA infarction as well as history of hypoxic encephalopathy after drug overdose 2018 requiring tracheostomy and received inpatient rehab services             -patient may may shower             -ELOS/Goals: 4-7 days/Mod I/Supervision             -Continue CIR therapies including PT, OT  -continue left resting WHO and PRAFO, AFO  --Interdisciplinary Team Conference today      2.  Antithrombotics: -DVT/anticoagulation: Subcutaneous Lovenox             -antiplatelet therapy: Aspirin 81 mg daily and Plavix 75 mg daily until 03/02/2019 and then aspirin alone 3. Pain Management: Tylenol as needed  1/10 Increased Topamax to 50 mg twice daily---observe  1/8 -spastic left hemiparesis---ROM/splinting  1/11: tizanidine increased to 2mg  bid 4. Mood:   1/12 -shift seroquel to 75mg  qhs for better sleep   -increase prozac to 40mg  for depression   -appreciate Dr. 3/12 input. Concerned about pt's suicidal ideations   -will at the very least need an aggressive outpt program and 24 hr supervision                              -antipsychotic agents: seroquel 5. Neuropsych: This patient is capable of making decisions on her own behalf. 6. Skin/Wound Care: Routine skin checks 7. Fluids/Electrolytes/Nutrition: encourage PO  1/7 -BUN sl elevated  Continue to encourage fluids. ---recheck labs Thursday    8.  Polysubstance abuse as well as tobacco abuse.  Provide counseling  -will need intensive outpt follow up given history 9.  Hyperlipidemia.  Continue Lipitor    LOS: 6 days A FACE TO FACE EVALUATION WAS  PERFORMED  Marvetta Gibbons 02/22/2019, 11:12 AM

## 2019-02-22 NOTE — Progress Notes (Signed)
Physical Therapy Weekly Progress Note  Patient Details  Name: Jane Watkins MRN: [id number] Date of Birth: 22-Nov-1995  Beginning of progress report period: February 16, 2018 End of progress report period: February 21, 2018  Today's Date: 02/22/2019  Pt is making fair progress towards LTG's. Pt continues to require CGA/min assist overall with RW for mobility tasks. Pt demonstrates impaired neuromuscular control of L UE/LE and impaired awareness during gait. Pt had AFO consult on this date with recommendation of PLS and toe cap to increase ease & safety of gait. Pt would benefit from ongoing skilled PT treatment to focus on transfers, gait, balance, L NMR, stair negotiation & d/c planning.  Patient continues to demonstrate the following deficits muscle weakness, decreased cardiorespiratoy endurance, decreased coordination, decreased attention to left, decreased attention, decreased awareness, decreased problem solving, decreased safety awareness and decreased memory, and decreased standing balance, decreased postural control, decreased balance strategies and L hemiparesis and therefore will continue to benefit from skilled PT intervention to increase functional independence with mobility.  Patient progressing toward long term goals..  Continue plan of care.  PT Short Term Goals Week 1:  PT Short Term Goal 1 (Week 1): = LTGs overall supervision Week 2:  PT Short Term Goal 1 (Week 2): STG = LTG due to estimated d/c date.    Therapy Documentation Precautions:  Precautions Precautions: Fall Precaution Comments: L hemi Restrictions Weight Bearing Restrictions: No   Therapy/Group: Individual Therapy  Sandi Mariscal 02/22/2019, 11:02 AM

## 2019-02-22 NOTE — Progress Notes (Signed)
Physical Therapy Session Note  Patient Details  Name: Jane Watkins MRN: [id number] Date of Birth: 1995/09/14  Today's Date: 02/22/2019 PT Individual Time: 0805-0900  PT Individual Time Calculation (min): 55 min   Short Term Goals: Week 1:  PT Short Term Goal 1 (Week 1): = LTGs overall supervision  Skilled Therapeutic Interventions/Progress Updates:   Pt received asleep in bed requiring extra time to awaken. Pt agreeable to tx & transfers supine>sitting EOB with bed flat, no rails with extra time & cuing to attempt task without assistance. Therapist assists with donning shoes & L AFO & pt transfers bed>w/c with min assist without AD and is transported into bathroom via w/c dependent assist. Pt transfers w/c<>toilet with CGA & grab bar, with assistance to manage clothing over L hips & pt with continent void on toilet. In dayroom, Meghan Science writer) present for AFO consult. Pt ambulates ~30 ft x 2 with RW & without AD with L PLS AFO with CGA & min assist respectively. Pt with slight hip abduction during LLE swing phase to advance foot, with much less ankle & knee instability compared to Saturday. Meghan recommends slightly taller & more rigid AFO and plans to place toe cap on shoe. Pt then ambulates 100 ft with RW & CGA with cuing for increased attention to L foot placement for increased safety with minimal return demo. Pt utilized kinetron in standing with BUE support and min assist with therapist assisting with L foot positioning to reduce pain & task focusing on weight shifting L<>R, dynamic balance, & LLE NMR with activity ended early 2/2 pt's c/o L foot pain. At end of session pt left in w/c with chair alarm donned, set up with meal tray & nurse in room.    Therapy Documentation Precautions:  Precautions Precautions: Fall Precaution Comments: L hemi Restrictions Weight Bearing Restrictions: No    Therapy/Group: Individual Therapy  Sandi Mariscal 02/22/2019, 9:04 AM

## 2019-02-23 ENCOUNTER — Inpatient Hospital Stay (HOSPITAL_COMMUNITY): Payer: 59 | Admitting: Physical Therapy

## 2019-02-23 ENCOUNTER — Inpatient Hospital Stay (HOSPITAL_COMMUNITY): Payer: 59 | Admitting: Occupational Therapy

## 2019-02-23 ENCOUNTER — Inpatient Hospital Stay (HOSPITAL_COMMUNITY): Payer: 59

## 2019-02-23 LAB — RAPID URINE DRUG SCREEN, HOSP PERFORMED
Amphetamines: NOT DETECTED
Barbiturates: NOT DETECTED
Benzodiazepines: NOT DETECTED
Cocaine: NOT DETECTED
Opiates: NOT DETECTED
Tetrahydrocannabinol: NOT DETECTED

## 2019-02-23 MED ORDER — LIDOCAINE 5 % EX PTCH
1.0000 | MEDICATED_PATCH | CUTANEOUS | Status: DC
  Administered 2019-02-23 – 2019-02-27 (×5): 1 via TRANSDERMAL
  Filled 2019-02-23 (×6): qty 1

## 2019-02-23 NOTE — Plan of Care (Signed)
  Problem: Consults Goal: RH STROKE PATIENT EDUCATION Description: See Patient Education module for education specifics  Outcome: Progressing   Problem: RH BOWEL ELIMINATION Goal: RH STG MANAGE BOWEL WITH ASSISTANCE Description: STG Manage Bowel with min Assistance. Outcome: Progressing Goal: RH STG MANAGE BOWEL W/MEDICATION W/ASSISTANCE Description: STG Manage Bowel with Medication with min Assistance. Outcome: Progressing   Problem: RH BLADDER ELIMINATION Goal: RH STG MANAGE BLADDER WITH ASSISTANCE Description: STG Manage Bladder With min Assistance Outcome: Progressing Goal: RH STG MANAGE BLADDER WITH EQUIPMENT WITH ASSISTANCE Description: STG Manage Bladder With Equipment With min Assistance Outcome: Progressing   Problem: RH SKIN INTEGRITY Goal: RH STG SKIN FREE OF INFECTION/BREAKDOWN Description: Skin will be free of infection/breakdown with min assistance Outcome: Progressing Goal: RH STG MAINTAIN SKIN INTEGRITY WITH ASSISTANCE Description: STG Maintain Skin Integrity With min Assistance. Outcome: Progressing   Problem: RH SAFETY Goal: RH STG ADHERE TO SAFETY PRECAUTIONS W/ASSISTANCE/DEVICE Description: STG Adhere to Safety Precautions With min Assistance/Device. Outcome: Progressing   Problem: RH COGNITION-NURSING Goal: RH STG ANTICIPATES NEEDS/CALLS FOR ASSIST W/ASSIST/CUES Description: STG Anticipates Needs/Calls for Assist With min Assistance/Cues. Outcome: Progressing   Problem: RH PAIN MANAGEMENT Goal: RH STG PAIN MANAGED AT OR BELOW PT'S PAIN GOAL Description: Less than 2 out of 10 Outcome: Progressing   Problem: RH KNOWLEDGE DEFICIT Goal: RH STG INCREASE KNOWLEDGE OF STROKE PROPHYLAXIS Description: Pt will be able to name stroke prophylaxis  Outcome: Progressing   

## 2019-02-23 NOTE — Progress Notes (Signed)
Physical Therapy Session Note  Patient Details  Name: Jane Watkins MRN: [id number] Date of Birth: May 21, 1995  Today's Date: 02/23/2019 PT Individual Time: 7078-6754 PT Individual Time Calculation (min): 54 min   Short Term Goals: Week 2:  PT Short Term Goal 1 (Week 2): STG = LTG due to estimated d/c date.  Skilled Therapeutic Interventions/Progress Updates:  Pt received in bed & agreeable to tx, transferring supine>sitting EOB with bed features & supervision. Therapist dons B shoes & L AFO dependent assist for time management. Pt transfers bed>w/c with CGA without AD via stand pivot and is transported into bathroom via w/c dependent assist. Pt transfers w/c<>toilet with close supervision & grab bar, managing clothing with encouragement to attempt on her own & with supervision overall, toilet transfer with supervision & continent void on toilet. Pt performs hand hygiene set up at sink from w/c level & is transported to gym. Pt transfers w/c<>mat table with supervision via stand pivot. While standing EOM pt engaged in 3" step taps with mod/max assist for standing balance with task focusing on weight shifting L<>R, LLE coordination & NMR, LLE single leg stance for strengthening, and dynamic balance. At rail in hallway pt performed side stepping with 1UE support and min assist with cuing for technique and focus on LLE strengthening & NMR. Pt ambulates 50 ft with RW & L AFO with min assist with pt frequently bumping LLE on RW, impulsivity & increased gait speed with reduced safety awareness despite max cuing for decreased speed & need to focus on LLE placement. Pt c/o L foot coming out of sock & shoe, potentially due to extensor tone & therapist readjusted sock & shoe. At end of session pt left in w/c with chair alarm donned, set up with meal tray, all needs in reach.   Therapy Documentation Precautions:  Precautions Precautions: Fall Precaution Comments: L hemi Restrictions Weight Bearing Restrictions:  No    Pain: Pt c/o toes on L foot going numb 2/2 laces being too tight so therapist loosened laces with pt reporting increased comfort.   Therapy/Group: Individual Therapy  Sandi Mariscal 02/23/2019, 9:02 AM

## 2019-02-23 NOTE — Progress Notes (Signed)
Physical Therapy Session Note  Patient Details  Name: Jane Watkins MRN: [id number] Date of Birth: [birth year]  Today's Date: 02/23/2019 PT Individual Time: 1000-1100 PT Individual Time Calculation (min): 60 min   Short Term Goals: Week 1:  PT Short Term Goal 1 (Week 1): = LTGs overall supervision  Skilled Therapeutic Interventions/Progress Updates:   Pt in supine and agreeable to therapy, no c/o pain. Supine>sit w/ supervision and CGA stand pivot to w/c. Total assist w/c transport to/from therapy gym. Worked on dynamic standing balance and lateral weight shifting while playing Wii bowling and tennis. Pt utilizing non-dominant RUE, so had pt learn technique in seated. No UE support once in standing, min assist from therapist to facilitate trunk rotation and lateral weight shifting. Pt avoiding shifting to LLE, manual and tactile cues to reach neutral LLE alignment in stance. Continued to work on L lateral weight shifting while bending to pick up horseshoes from raised surface and hang on basketball rim. Pt w/ slightly more weight shifting. Worked on gait w/o AD, ambulated 20' x2 w/ mod assist for L lateral weight shifting, L foot placement, and L knee extension in stance. If therapist was able to facilitate adequate L knee extension and L lateral weight shifting in single leg stance, pt able to take controlled, swing-through step w/ R foot. Continued to work on forced L WB while standing to kick soccer ball w/ R foot. Therapist providing manual facilitate of L weight shifting and L knee extension. 2nd helper kicking ball back and forth. Added in challenge of stopping ball w/ RLE and then kicking to increase stance time on L. Intermittently worked on kicking w/ LLE. Returned to room via w/c, ended session in supine and all needs in reach.   Therapy Documentation Precautions:  Precautions Precautions: Fall Precaution Comments: L hemi Restrictions Weight Bearing Restrictions: No  Therapy/Group:  Individual Therapy  Zarahi Fuerst Melton Krebs 02/23/2019, 12:15 PM

## 2019-02-23 NOTE — Progress Notes (Signed)
Recreational Therapy Session Note  Patient Details  Name: Jane Watkins MRN: [id number] Date of Birth: 1995/03/31 Today's Date: 02/23/2019 Time: 1035-1100 Pain: no c/o Skilled Therapeutic Interventions/Progress Updates: Session focused on activity tolerance, dynamic standing balance, weight shifting , WBing through LLE and discharge planning during co-treat with PT.  Pt stood with min assist for soccer/kicking skills.  Pt stood WBing through LLE while kicking a ball with the RLE.  Transitioned to kicking with RLE Min assist for balance and intermittent assistance to bring RLE back underneath her.  Pt then stood WBing through LLE while stopping the ball with RLE before kicking it.  Pt smiling and talkative this session, sharing previous soccer experiences.  Pt questioning if her discharge date could be moved up, wants to compromise on the date.  Discussed therapy goals and need for supervision at discharge.  Pt stated her boyfriend will be taking a week off to stay with her.  Discussed the recommendation and need  for supervision would likely be longer than the week after discharge.  Pt stated understanding, but still requests treatment team consider her request.  Informed primary PT of pts request. Therapy/Group: Co-Treatment Kristelle Cavallaro 02/23/2019, 11:30 AM

## 2019-02-23 NOTE — Progress Notes (Signed)
This nurse was calling into the room at around 6:15pm by the nurse tech. Upon arrival into the room the patient  was sitting in the wheelchair and boyfriend was explaining how he assisted the patient into the bathroom she dropped the toilet paper on the floor and was trying to pick it up and hit her head on the assist bar. This writer begin to assess patient and noted her pupils to be dilated, patient complained to feeling nauseous and increased anxiety. No knot or  impairments were noted to right side of head.  This Clinical research associate went to [employer] and explained what was going on new orders were placed. Patient v/s were retrieved at 103/76, 96, o2 100 on RA. On call Camillia Herter called and informed of situation. Kalman Shan, LPN

## 2019-02-23 NOTE — Progress Notes (Signed)
Occupational Therapy Session Note  Patient Details  Name: Jane Watkins MRN: [id number] Date of Birth: April 03, 1995  Today's Date: 02/23/2019  Short Term Goals: Week 1:  OT Short Term Goal 1 (Week 1): LTG=STG 2/2 ELOS    1st session: OT individual time 1330-1435 OT Individual Time Calculation (min): 65 min  Skilled Therapeutic Interventions/Progress Updates: patient approached while she was sitting up in bed with head of bed elevated.   Boyfriend with whom she will live after discharge was present for patient and family education.   Patient slow to answer and affect quiet and sullen as evidenced by not responding to questions initially and looking down or away and holding her head and moving away eye contact.   Boyfriend often asked her to "speak up for yourself."    Initially patient denied interest in shower and opted for this clinician's offer to provide balance and self care mobility activities.   Boyfriend spoke to [facility] and voiced need for him to be educated on showers and other self care.  She concurred and she participated in patient fmaily education as follows:  Bed to w/c transfer:   Close stand step transfer to her right in order to help prevent left ankle lateral hyper extension/roll over.    W/c to toilet transfer via grab bar=  Stand pivot with close S and slow and carefully        W/c to/from shower chair transfer via grab bar= contact guard assist           UB bathing and dressing for hemi technique and left hand over hand assist= moderate assistance due to left UE hemiparesis  LB bathing and dressing (she brought right leg up to meet her wash cloth and hands on shower chair and w/c.... very flexible and was able to  Maintain left leg over right knee) utilizing hemi dressing techniques and reminders to attend to left arm....she tended to neglect left arm for washing and dressing= moderate assistance.  Sit to stand= close S no knee buckles or left ankle 'roll outs/hyperextension'  this session.  Patient was left in the care of boyfriend who has prior been signed off the asssist with in room short transfer.  He was preparing to help her with oral care as she sat in her w/c at sink.  During education boyfriend demonstrated intact and very good safety awareness and carry over of hemi dressing and bathing techniques to help Jane Watkins regain restorative function and where necessary to utilize compensatory techniques.  Continue OT Plan of Care  2nd session:returned to patient room to retrieve SAEBO estim that had been applied by boyfriend from  [phone].   He applied leads ealier in the day under supervision of this cliniican as he stated he was trained by primary therapist.  ( Due to patient training from primary therapist, staff agreed ok to allow him to apply estim) . Also this session, patient asked for help to braid her hair.   Due to left UE hemiparesis she required total assist to comb hair into one braid.  Continue OT Plan of Care     Therapy Documentation Precautions:  Precautions Precautions: Fall Precaution Comments: L hemi Restrictions Weight Bearing Restrictions: No  Pain:denied   Therapy/Group: Individual Therapy  Bud Face [facility] 02/23/2019, 4:31 PM

## 2019-02-23 NOTE — Evaluation (Signed)
Recreational Therapy Assessment and Plan  Patient Details  Name: Jane Watkins MRN: [id number] Date of Birth: [birth year] Today's Date: 02/23/2019  Rehab Potential:  Good ELOS:   d/c 1/19  Assessment Problem List:      Patient Active Problem List   Diagnosis Date Noted  . 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 Polysubstance abuse ([place])   . Dyslipidemia   . Ischemic stroke diagnosed during current admission ([place]) 02/13/2019  . MDD (major depressive disorder), recurrent episode, moderate ([place]) 02/10/2019  . Dysphagia 02/09/2019  . Elevated CK 02/09/2019  . CVA (cerebral vascular accident) ([place]) 02/07/2019  . Cellulitis of arm 09/20/2017  . IV drug abuse ([place]) 09/20/2017  . Quadriceps weakness 06/18/2016  . TBI (traumatic brain injury) ([place]) 05/01/2016  . 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 Respiratory distress   . Glasgow coma scale total score 3-8 ([place])   . Pneumonia of both lower lobes due to methicillin resistant Staphylococcus aureus (MRSA) ([place])   . Acute pulmonary edema ([facility])   . Non-traumatic rhabdomyolysis   . Opioid abuse ([place])   . Elevated troponin   . Acute respiratory failure with hypoxia ([place])   . Drug overdose   . Elevated liver enzymes   . Encephalopathy acute   . Encephalopathy 04/19/2016  . Opiate overdose ([place]) 04/19/2016    Past Medical History:      Past Medical History:  Diagnosis Date  . AKI (acute kidney injury) ([place]) 2018   "from overdose"  . Anxiety   . Daily headache   . Depression   . Drug overdose 04/2016   [name] 05/01/2016  . GERD (gastroesophageal reflux disease)    "when I was younger; gone now" (09/21/2017)  . Migraine    "q couple weeks" (09/21/2017)  . Overdose    Past Surgical History:       Past Surgical History:  Procedure Laterality Date  . APPENDECTOMY  04/2013  . I & D EXTREMITY Left 09/20/2017   Procedure: IRRIGATION AND DEBRIDEMENT LEFT ARM;  Surgeon: [name],  MD;  Location: [facility];  Service: Orthopedics;  Laterality: Left;  . TEE WITHOUT CARDIOVERSION N/A 02/10/2019   Procedure: TRANSESOPHAGEAL ECHOCARDIOGRAM (TEE);  Surgeon: [name], MD;  Location: [facility];  Service: Cardiovascular;  Laterality: N/A;  Polysubstance abuser  . TONGUE SURGERY  ~ 2007   "related to lisp"  . TRACHEOSTOMY  04/2016   [name] 05/01/2016    Assessment & Plan Clinical Impression: Patient is a 24 y.o. year old female with recent admission to the hospital onleft-handed female with history of IV drug use, polysubstance/tobacco abuse, anxiety. History taken from chart review, patient, and boyfriend. Patient on no prescription medications at time of admission. Patient lives with her boyfriend. She reportedly works at [employer]. Independent prior to admission. 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 She was discharged home with family ambulating extended distances with a rolling walker. Presented 02/07/2019 after being found unresponsive. EMS was contacted the patient received Narcan with some improvement in mental status. Admission labs with WBC 20,700, potassium 5.2, BUN 25, creatinine 0.67, urine drug screen positive cocaine, marijuana and benzodiazepines, alcohol level less than 10, SARS coronavirus negative, blood cultures no growth to date. Cranial CT scan showed areas of indistinct low-density in the right basal ganglia with mass-effect on the right lateral ventricle. Patient did not receive TPA. MRI showed acute infarct of right MCA territory, affecting the basal  ganglia and affecting the cortical and subcortical brain in a largely watershed distribution. CTA of head and neck showed no major arterial occlusion or significant stenosis. Echocardiogram with EF of 60%. Neurology follow-up currently maintained on aspirin and Plavix for CVA prophylaxis  x3 weeks then aspirin alone with Plavix to be completed 03/02/2019. Subcutaneous Lovenox for DVT prophylaxis. Psychiatry consulted for depression and maintained on Seroquel as well as Prozaac. Tolerating a regular diet.  Patient transferred to CIR on 02/16/2019 .    Pt presents with decreased activity tolerance, decreased functional mobility, decreased balance, decreased coordination, expresses feelings of anxiety from hospitalization Limiting pt's independence with leisure/community pursuits.   Plan Min 2 TR sessions >25 minutes per week during LOS  Recommendations for other services: Neuropsych  Discharge Criteria: Patient will be discharged from TR if patient refuses treatment 3 consecutive times without medical reason.  If treatment goals not met, if there is a change in medical status, if patient makes no progress towards goals or if patient is discharged from hospital.  The above assessment, treatment plan, treatment alternatives and goals were discussed and mutually agreed upon: by patient.  [place] 02/23/2019, 11:10 AM

## 2019-02-23 NOTE — Progress Notes (Signed)
[facility] PHYSICAL MEDICINE & REHABILITATION PROGRESS NOTE   Subjective/Complaints: Feels that headaches are a little better. Sleep is an issue still. Spasms aren't as bad LUE and LLE. Complains of left shoulder pain.   ROS: Patient denies fever, rash, sore throat, blurred vision, nausea, vomiting, diarrhea, cough, shortness of breath or chest pain, joint or back pain, headache, or mood change.   Objective:   No results found. No results for input(s): WBC, HGB, HCT, PLT in the last 72 hours. No results for input(s): NA, K, CL, CO2, GLUCOSE, BUN, CREATININE, CALCIUM in the last 72 hours.  Intake/Output Summary (Last 24 hours) at 02/23/2019 0937 Last data filed at 02/23/2019 0925 Gross per 24 hour  Intake 240 ml  Output --  Net 240 ml     Physical Exam: Vital Signs Blood pressure 95/66, pulse 68, temperature 98.4 F (36.9 C), resp. rate 14, last menstrual period 01/31/2019, SpO2 100 %. Constitutional: No distress . Vital signs reviewed. HEENT: EOMI, oral membranes moist Neck: supple Cardiovascular: RRR without murmur. No JVD    Respiratory: CTA Bilaterally without wheezes or rales. Normal effort    GI: BS +, non-tender, non-distended  Musculoskeletal:     Comments: No edema or tenderness in extremities  Neurological: She is alert.  Follows basic commands.   Oriented x3.   Motor: RUE/RLE: 5/5 proximal to distal LUE: Trace proximal to distal LLE: HF, KE 2/5, ADF trace/5- APF 1 to 1+/5. Heel cord tight Tone: MAS 2 LLE gastroc/1+hamstrings, and 1+ LUE biceps , no clonus noted in the left upper or left lower limb. Left inattention.  Skin: Skin is warm and dry.  Psychiatric: more engaging, brighter today  Assessment/Plan: 1. Functional deficits secondary to right MCA infarction which require 3+ hours per day of interdisciplinary therapy in a comprehensive inpatient rehab setting.  Physiatrist is providing close team supervision and 24 hour management of active medical problems  listed below.  Physiatrist and rehab team continue to assess barriers to discharge/monitor patient progress toward functional and medical goals  Care Tool:  Bathing    Body parts bathed by patient: Left arm, Chest, Abdomen, Front perineal area, Buttocks, Right upper leg, Left upper leg, Face, Right arm, Right lower leg, Left lower leg   Body parts bathed by helper: Left lower leg, Right lower leg, Right arm     Bathing assist Assist Level: Contact Guard/Touching assist     Upper Body Dressing/Undressing Upper body dressing   What is the patient wearing?: Pull over shirt, Bra    Upper body assist Assist Level: Minimal Assistance - Patient > 75%    Lower Body Dressing/Undressing Lower body dressing      What is the patient wearing?: Pants, Underwear/pull up     Lower body assist Assist for lower body dressing: Minimal Assistance - Patient > 75%     Toileting Toileting Toileting Activity did not occur (Clothing management and hygiene only): N/A (no void or bm)  Toileting assist Assist for toileting: Minimal Assistance - Patient > 75%     Transfers Chair/bed transfer  Transfers assist     Chair/bed transfer assist level: Supervision/Verbal cueing     Locomotion Ambulation   Ambulation assist      Assist level: Minimal Assistance - Patient > 75% Assistive device: Walker-rolling Max distance: 50 ft   Walk 10 feet activity   Assist     Assist level: Minimal Assistance - Patient > 75% Assistive device: Walker-rolling, Orthosis   Walk 50 feet activity  Assist    Assist level: Minimal Assistance - Patient > 75% Assistive device: Walker-rolling, Orthosis    Walk 150 feet activity   Assist Walk 150 feet activity did not occur: Safety/medical concerns  Assist level: Minimal Assistance - Patient > 75% Assistive device: No Device    Walk 10 feet on uneven surface  activity   Assist     Assist level: 2 helpers      Wheelchair     Assist Will patient use wheelchair at discharge?: No             Wheelchair 50 feet with 2 turns activity    Assist            Wheelchair 150 feet activity     Assist          Blood pressure 95/66, pulse 68, temperature 98.4 F (36.9 C), resp. rate 14, last menstrual period 01/31/2019, SpO2 100 %.  Medical Problem List and Plan: 1.  Left side weakness secondary to right MCA infarction as well as history of hypoxic encephalopathy after drug overdose 2018 requiring tracheostomy and received inpatient rehab services             -patient may may shower             -ELOS/Goals: 4-7 days/Mod I/Supervision             -Continue CIR therapies including PT, OT  -continue left resting WHO and PRAFO, AFO 2.  Antithrombotics: -DVT/anticoagulation: Subcutaneous Lovenox             -antiplatelet therapy: Aspirin 81 mg daily and Plavix 75 mg daily until 03/02/2019 and then aspirin alone 3. Pain Management: Tylenol as needed  1/10 Increased Topamax to 50 mg twice daily---observe  1/8 -spastic left hemiparesis---ROM/splinting  1/11: tizanidine increased to 2mg  bid  1/13: added Lidocaine patch to left shoulder.  4. Mood:   1/12 -shift seroquel to 75mg  qhs for better sleep   -increase prozac to 40mg  for depression   -appreciate Dr. 3/12 input. Concerned about pt's suicidal ideations   -will at the very least need an aggressive outpt program and 24 hr supervision               -antipsychotic agents: seroquel 5. Neuropsych: This patient is capable of making decisions on her own behalf. 6. Skin/Wound Care: Routine skin checks 7. Fluids/Electrolytes/Nutrition: encourage PO  1/7 -BUN sl elevated  Continue to encourage fluids. ---recheck labs Thursday   8.  Polysubstance abuse as well as tobacco abuse.  Provide counseling  -will need intensive outpt follow up given history 9.  Hyperlipidemia.  Continue Lipitor    LOS: 7 days A FACE TO FACE  EVALUATION WAS PERFORMED  Marvetta Gibbons Micole Delehanty 02/23/2019, 9:37 AM

## 2019-02-23 NOTE — Progress Notes (Signed)
Patient still having headaches. Patient stated "it's not that bad". Scheduled Topamax was given and patient was made aware by writer that Tylenol is available as needed if headache increase or does not relieve. Patient also  Refused Prafo boot for the left leg, patient stated "it's not comfortable".

## 2019-02-24 ENCOUNTER — Inpatient Hospital Stay (HOSPITAL_COMMUNITY): Payer: Self-pay

## 2019-02-24 ENCOUNTER — Inpatient Hospital Stay (HOSPITAL_COMMUNITY): Payer: Self-pay | Admitting: Occupational Therapy

## 2019-02-24 ENCOUNTER — Ambulatory Visit (HOSPITAL_COMMUNITY): Payer: Self-pay | Admitting: *Deleted

## 2019-02-24 LAB — BASIC METABOLIC PANEL
Anion gap: 8 (ref 5–15)
BUN: 29 mg/dL — ABNORMAL HIGH (ref 6–20)
CO2: 21 mmol/L — ABNORMAL LOW (ref 22–32)
Calcium: 9.2 mg/dL (ref 8.9–10.3)
Chloride: 113 mmol/L — ABNORMAL HIGH (ref 98–111)
Creatinine, Ser: 0.75 mg/dL (ref 0.44–1.00)
GFR calc Af Amer: >60 mL/min (ref >60)
GFR calc non Af Amer: >60 mL/min (ref >60)
Glucose, Bld: 94 mg/dL (ref 70–99)
Potassium: 4 mmol/L (ref 3.5–5.1)
Sodium: 142 mmol/L (ref 135–145)

## 2019-02-24 NOTE — Progress Notes (Signed)
Occupational Therapy Progress Note  Patient Details  Name: Jane Watkins MRN: [id number] Date of Birth: 31-Dec-1995  Today's Date: 02/24/2019 OT Individual Time: 1032-1130 OT Individual Time Calculation (min): 58 min   Skilled Therapeutic Interventions/Progress Updates:    Pt greeted semi-reclined in bed and agreeable to OT treatment session. Pt declined bathing/dressing today. Pt ambulated to the bathroom with RW and min A. Pt needed min A to manage clothing.  Pt brought down to therapy gym in wc and completed stand-pivot to therapy mat with CGA. OT fit pt for Giv-Mor sling in a small and educated on purpose, positioning, and how to don/doff. Pt practiced sit<>stadns x 5 to make sure sling fit appropriately. 1:1 NMES applied to supraspinatus and middle deltoid to help approximate shoulder joint to reduce sublux and reduce pain.   Ratio 1:3 Rate 35 pps Waveform- Asymmetric Ramp 1.0 Pulse 300 Intensity- 20 Duration -   15  Report of pain at the beginning of session 0 Report of pain at the end of session 0  No adverse reactions after treatment and is skin intact.   During NMES, OT worked on L NMR with cup stacking activity with hand over hand A. Pt with slight scapula activation to help push cups forward and back slightly. OT brought pt through elbow, wrist, hand, shoulder ROM with joint input provided for neuro re-ed. OT placed SAEBO e-stim. SAEBO left on for 60 minutes. OT returned to remove SAEBO with skin intact and no adverse reactions.  [place]o Stim One 330 pulse width 35 Hz pulse rate On 8 sec/ off 8 sec Ramp up/ down 2 sec Symmetrical Biphasic wave form  Max intensity at 500 Ohm load  Therapy Documentation Precautions:  Precautions Precautions: Fall Precaution Comments: L hemi Restrictions Weight Bearing Restrictions: No Pain:  denies pain   Therapy/Group: Individual Therapy  [name] 02/24/2019, 11:34 AM

## 2019-02-24 NOTE — Progress Notes (Signed)
Physical Therapy Session Note  Patient Details  Name: Jane Watkins MRN: [id number] Date of Birth: 07-26-1995  Today's Date: 02/24/2019 PT Individual Time: 1345-1445  And 800-859 PT Individual Time Calculation (min): 60 min and 59 min  Short Term Goals: Week 1:  PT Short Term Goal 1 (Week 1): = LTGs overall supervision Week 2:  PT Short Term Goal 1 (Week 2): STG = LTG due to estimated d/c date. Week 3:     Skilled Therapeutic Interventions/Progress Updates:      Therapy Documentation Precautions:  Precautions Precautions: Fall Precaution Comments: L hemi Restrictions Weight Bearing Restrictions: No  PAIN  Am session - denies pain  Pt seen this am for session w/focus on dynamic balance, wt shifting, and knee control.  Pt initially seated on EOB w/nursing.  Reminded nursing that pt needed to wear AFO for transfers to protect ankle from injury.  Donned AFO/shoe max assist.  STS from edge of bed w/cga and verbal cues for midline/increased wbing on LLE, short distance gait to BR w/min to cga in room including turns and negotiating transition at doorway.  Commode transfer w/cues for symmetrical wbing, attention to L, cga.  Manages pants,  up/down without assist, performs hygiene w/set up.  Gait 171ft to gym w/cga, verbal cues to attend to L knee control.  Pt able to decrease/partially control extension thrust at loading w/cues to attend and when not fatigued.  Pt educated re use of maxi ski for dynamic balance training.  Harness applied by therapist in sitting and standing w/cues to maintain L knee extension during this.   Gait 57ft forward/retrogait to allow pt to test harness/understand support/adjustments made.  Dynamic balance/wt shifting in maxi sky: tapping 3in step w/RLE w/cues to facilitate L quad/glut activation which improved w/repitiion, cues to attend L Standing kicking slow roll ball w/RLE, cues as above, improved wt shifting to L noted Standing stopping slow roll ball w/RLE  then kicking ball back to therapist w/continued increased wt shifting to L.   Single limb stance LLE held max 3 sec, cues for quad and glut activation, focus on wt shifting.  Gait 120ft to room w/cga, cues for knee control at loading/midstance, no AD for balance challenge. Pt transferred to bed w/cues for midline, cues to attend to L, cga.  Sit to supine w/supervision, pt left supine in bed w/needs in reach, bed alarm set.  PM session - co rx w/recreation therapist PAIN denies pain Pt initially seated on eob w/boyfriend assisting pt w/donning AFO in prep for session.  Educated pt/Jane Watkins w/donning of Giv-mor sling.  STS from bed w/cga and gait 158ft to gym w/cues/assist as described above.  Increased tendency of extension thrust on L vs am session, likely due to fatigue.  Pt stood x 2 min w/cues for midline while therapist adjusted sling.  Session again focusing on wt shifting, knee/hip control, balance, and tasks to promote attention to L environment.  Treatment activities including: Standing w/R foot on 2in step, balance disk while performing beach ball toss w/cga and tactile cueing to promote wt shift L, activation of L quads Standing stepping w/RLE, loading knee w/focus on control, and tossing ball forward or to L into goal for more dynamic challenge/dual task challenge  Obstacle course including stepping onto and over step, over box, length of balance beam w/single limb (alternated w/return pass), locating and retriving pegs placed in L environment (50% success) at varying heights, and negotiating obstacles on floor (failed to recognize garbage can directly in path on  floor to L).  Repeated x 2 w/pt able to locate and retrieve remaining pegs on second pass.  Requires varying amount of assistance from cga to min assist.  Standing at stabilized shopping cart for balance - performed hip extension w/knee held in flexion by therapist x 10 w/manual resistance.  Attempted eccentric hamstrings but pt  unable to activate hamstrings in this position w/mutimodal cues.  Gait 161ft to room w/cga w/increased incidence of exension thrust at loading and compensatory trunk rotation to aid w/advancement of LLE due to fatigue, cga for balance, cues to attend to L.  Pt transferred to edge of bed and Jane Watkins w/patient assisting w/removal of bracing.  Boyfriend w/questions regarding dc date, discussed w/patient and Jane Watkins and agreed to address w/team which occurred following am session at patient request.   Therapy/Group: Individual Therapy  Jane Watkins, PT   Jane Watkins 02/24/2019, 4:28 PM

## 2019-02-24 NOTE — Progress Notes (Signed)
Orthopedic Tech Progress Note Patient Details:  Jane Watkins 1996-02-10 [id number]  Patient ID: Jane Watkins, female   DOB: 1995-08-23, 24 y.o.   MRN: [id number]   Saul Fordyce 02/24/2019, 3:59 PMCalled [facility] for brace orders.

## 2019-02-24 NOTE — Progress Notes (Signed)
[facility] PHYSICAL MEDICINE & REHABILITATION PROGRESS NOTE   Subjective/Complaints: Felt some tingling in left arm last night. Hit her head slightly while in BR with staff. Denies any problems this morning. No more tingling. Has headache but overall improved  ROS: Patient denies fever, rash, sore throat, blurred vision, nausea, vomiting, diarrhea, cough, shortness of breath or chest pain, joint or back pain,   or mood change.    Objective:   CT HEAD WO CONTRAST  Result Date: 02/23/2019 CLINICAL DATA:  Initial evaluation for recent fall, struck head. EXAM: CT HEAD WITHOUT CONTRAST TECHNIQUE: Contiguous axial images were obtained from the base of the skull through the vertex without intravenous contrast. COMPARISON:  Comparison made with previous MRI from 02/07/2019. FINDINGS: Brain: Continued interval evolution of previously identified infarcts involving the right cerebral hemisphere, stable in size and distribution as compared to previous. Associated edema and regional mass effect has mildly increased from previous, with progressive 5 mm of right-to-left shift. Right lateral ventricle is partially effaced. No hydrocephalus or ventricular trapping. Basilar cisterns remain patent. Patchy hyperdensity at the level of the right basal ganglia consistent with petechial hemorrhage, similar to previous. No frank hemorrhagic transformation. No other new acute intracranial hemorrhage or large vessel territory infarct. No mass lesion. No extra-axial fluid collection. Vascular: No hyperdense vessel. Skull: Scalp soft tissues and calvarium within normal limits. Sinuses/Orbits: Globes and orbital soft tissues within normal limits. Retention cyst present at the left frontal sinus. Paranasal sinuses are otherwise largely clear. No mastoid effusion. Other: None. IMPRESSION: 1. Continued interval evolution of subacute ischemic infarcts involving the right cerebral hemisphere with progressive regional mass effect.  Resultant 5 mm right-to-left midline shift increased from previous. No hydrocephalus or ventricular trapping. Associated petechial hemorrhage at the right basal ganglia unchanged. No frank hemorrhagic transformation. 2. No other new acute intracranial abnormality. No acute traumatic injury status post recent fall. Electronically Signed   By: Rise Mu M.D.   On: 02/23/2019 22:30   No results for input(s): WBC, HGB, HCT, PLT in the last 72 hours. Recent Labs    02/24/19 0508  NA 142  K 4.0  CL 113*  CO2 21*  GLUCOSE 94  BUN 29*  CREATININE 0.75  CALCIUM 9.2    Intake/Output Summary (Last 24 hours) at 02/24/2019 1037 Last data filed at 02/23/2019 1807 Gross per 24 hour  Intake 580 ml  Output --  Net 580 ml     Physical Exam: Vital Signs Blood pressure 108/73, pulse 92, temperature 98.8 F (37.1 C), resp. rate 16, last menstrual period 01/31/2019, SpO2 98 %. Constitutional: No distress . Vital signs reviewed. HEENT: EOMI, oral membranes moist Neck: supple Cardiovascular: RRR without murmur. No JVD    Respiratory: CTA Bilaterally without wheezes or rales. Normal effort    GI: BS +, non-tender, non-distended  Musculoskeletal:     Comments: No edema or tenderness in extremities  Neurological: very alert. Oriented x 3.  Motor: RUE/RLE: 5/5 proximal to distal LUE: Trace proximal to distal-- no change LLE: HF, KE 2/5, ADF trace/5- APF 1 to 1+/5. Heel cord tight Tone: MAS 1-2 LLE gastroc/trace hamstrings, and trace LUE biceps  Skin: Skin is warm and dry.  Psychiatric: more engaging  Assessment/Plan: 1. Functional deficits secondary to right MCA infarction which require 3+ hours per day of interdisciplinary therapy in a comprehensive inpatient rehab setting.  Physiatrist is providing close team supervision and 24 hour management of active medical problems listed below.  Physiatrist and rehab team continue  to assess barriers to discharge/monitor patient progress toward  functional and medical goals  Care Tool:  Bathing    Body parts bathed by patient: Left arm, Chest, Abdomen, Front perineal area, Buttocks, Right upper leg, Left upper leg, Face, Right arm, Right lower leg, Left lower leg   Body parts bathed by helper: Left lower leg, Right lower leg, Right arm     Bathing assist Assist Level: Contact Guard/Touching assist     Upper Body Dressing/Undressing Upper body dressing   What is the patient wearing?: Pull over shirt, Bra    Upper body assist Assist Level: Minimal Assistance - Patient > 75%    Lower Body Dressing/Undressing Lower body dressing      What is the patient wearing?: Pants, Underwear/pull up     Lower body assist Assist for lower body dressing: Minimal Assistance - Patient > 75%     Toileting Toileting Toileting Activity did not occur (Clothing management and hygiene only): N/A (no void or bm)  Toileting assist Assist for toileting: Minimal Assistance - Patient > 75%     Transfers Chair/bed transfer  Transfers assist     Chair/bed transfer assist level: Contact Guard/Touching assist     Locomotion Ambulation   Ambulation assist      Assist level: Moderate Assistance - Patient 50 - 74% Assistive device: No Device Max distance: 20'   Walk 10 feet activity   Assist     Assist level: Moderate Assistance - Patient - 50 - 74% Assistive device: No Device   Walk 50 feet activity   Assist    Assist level: Minimal Assistance - Patient > 75% Assistive device: Walker-rolling, Orthosis    Walk 150 feet activity   Assist Walk 150 feet activity did not occur: Safety/medical concerns  Assist level: Minimal Assistance - Patient > 75% Assistive device: No Device    Walk 10 feet on uneven surface  activity   Assist     Assist level: 2 helpers     Wheelchair     Assist Will patient use wheelchair at discharge?: No             Wheelchair 50 feet with 2 turns  activity    Assist            Wheelchair 150 feet activity     Assist          Blood pressure 108/73, pulse 92, temperature 98.8 F (37.1 C), resp. rate 16, last menstrual period 01/31/2019, SpO2 98 %.  Medical Problem List and Plan: 1.  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 She does have a little more edema and shift which bears watching. Clinically, she's back to baseline today. May have been an anxiety component to symptoms last night as well.  2.  Antithrombotics: -DVT/anticoagulation: Subcutaneous Lovenox             -antiplatelet therapy: Aspirin 81 mg daily and Plavix 75 mg daily until 03/02/2019 and then aspirin alone 3. Pain Management: Tylenol  as needed  1/10 Increased Topamax to 50 mg twice daily---improved  1/8 -spastic left hemiparesis---ROM/splinting  1/11: tizanidine increased to 2mg  bid  1/13: added Lidocaine patch to left shoulder.   1/14: overall pain improved 4. Mood:   1/12 -shift seroquel to 75mg  qhs for better sleep   -increased prozac to 40mg  for depression   -appreciate Dr. Ferne Coe input. Concerned about pt's suicidal ideations   -will at the very least need an aggressive outpt program and 24 hr supervision               -antipsychotic agents: seroquel 5. Neuropsych: This patient is capable of making decisions on her own behalf. 6. Skin/Wound Care: Routine skin checks 7. Fluids/Electrolytes/Nutrition: encourage PO  1/7 -BUN sl elevated  1/14- I personally reviewed the patient's labs today.  BUN improved   8.  Polysubstance abuse as well as tobacco abuse.   Provide counseling  -will need intensive outpt follow up given history 9.  Hyperlipidemia.  Continue Lipitor    LOS: 8 days A FACE TO FACE EVALUATION WAS PERFORMED  Meredith Staggers 02/24/2019, 10:37 AM

## 2019-02-24 NOTE — Progress Notes (Signed)
Recreational Therapy Session Note  Patient Details  Name: Jane Watkins MRN: [id number] Date of Birth: 12/05/1995 Today's Date: 02/24/2019 Time:  3643-8377  Pain: no c/o  Skilled Therapeutic Interventions/Progress Updates: Session focused on activity tolerance, ambulation, weight shifting, weightbearing through LLE, left inattention during co-treat with PT.  Pt ambulated from her room to the gym without AD with min assist.  ONce in the gym, pt performed standing activities with R foot on 2 inch step for volleyball task with contact guard assist.  Transitioned to standing, stepping with RLE and coming up onto toe of L foot for basketball activity.  Pt stated she enjoyed leisure activities in therapy.  Lastly, pt ambulated through an obstacle course stepping on and over obstacles including blocks of various heights and walking over balance beam, on foot on the beam and the other on the floor whiles scanning for items on her left.  Pt required contact guard- miin assist for obstacle course negotiation.  Therapy/Group: Co-Treatment  Shiron Whetsel 02/24/2019, 4:32 PM

## 2019-02-24 NOTE — Progress Notes (Signed)
Beginning of the shift patient denies any pain and partner asking questions about when is the patient going to CT. Writer informed patient's partner that there is no exact time for pick-up. Patient called around 8:20 and requested to bring her bedtime medications d/t headache. When writer arrived in patient room, patient observed being tearful and worried. Patient stated " I'm worried that I'm going to have another stroke". Writer reassured patient that if there's any changes the doctor will let patient know.

## 2019-02-25 ENCOUNTER — Inpatient Hospital Stay (HOSPITAL_COMMUNITY): Payer: Medicaid Other | Admitting: Physical Therapy

## 2019-02-25 ENCOUNTER — Inpatient Hospital Stay (HOSPITAL_COMMUNITY): Payer: Medicaid Other | Admitting: Occupational Therapy

## 2019-02-25 NOTE — Progress Notes (Signed)
Recreational Therapy Session Note  Patient Details  Name: Jane Watkins MRN: [id number] Date of Birth: 1995-11-05 Today's Date: 02/25/2019 Time:  830-9 Pain: no c/o Skilled Therapeutic Interventions/Progress Updates: Session focused on community reintegration skills and floor transfer during co-treat with PT.  Pt ambulated using RW through an obstacle course negotiating over and around obstacles simulating home and community environment.  Pt requires instructional cues for safety and visual scanning and min assist to navigate obstacles with RW.  Discussed floor transfer and had pt practice getting up from the floor with mod assist.    Therapy/Group: Co-Treatment  Jane Watkins 02/25/2019, 11:11 AM

## 2019-02-25 NOTE — Progress Notes (Signed)
Recreational Therapy Discharge Summary Patient Details  Name: [name] MRN: [id number] Date of Birth: 1995-11-13 Today's Date: 02/25/2019  Long term goals set: 2  Long term goals met: 1  Comments on progress toward goals: Pt has made good progress during LOS and is scheduled for discharge home with boyfriend on 1/18 to provide supervision/min assist.  TR sessions/education focused on activity analysis, activity tolerance, dynamic standing balance & community reintegration.  Pt met Min assist level for community skills ambulatory level using RW and verbal cues for safety and visual scanning.   Pt requires min assist for balance with moderately complex standing TR tasks.  Pt is anxious to discharge home.  Reasons for discharge: discharge from hospital   Patient/family agrees with progress made and goals achieved: Yes  Jane Watkins 02/25/2019, 11:27 AM

## 2019-02-25 NOTE — Progress Notes (Signed)
Physical Therapy Session Note  Patient Details  Name: Jane Watkins MRN: [id number] Date of Birth: 1995-09-17  Today's Date: 02/25/2019 PT Individual Time: 6294-7654 and 6503-5465 PT Individual Time Calculation (min): 59 min and 84 min  Short Term Goals: Week 2:  PT Short Term Goal 1 (Week 2): STG = LTG due to estimated d/c date.  Skilled Therapeutic Interventions/Progress Updates:  Treatment 1: Pt received in bed & agreeable to tx. Pt reports unrated HA & nurse administered medicine. Pt transfers to sitting EOB with supervision and therapist assists with donning L shoe & AFO, pt dons R but needs assistance to tie laces. Pt ambulates short distance bed>w/c without AD & CGA with ongoing impaired neuromuscular control LLE. Gait training without AD with pt requiring min assist & demonstrating decreased balance during gait, then trialed gait with RW & pt able to progress from CGA to very close supervision with ongoing impaired placement of LLE but slightly improved balance. Discussed benefits & increased safety with use of RW at home & pt agreeable. Recreational therapist then joined session. Pt ambulates through obstacle course (weaving between cones, negotiating uneven mat & step, and stepping over poles) with RW & min assist with therapist providing cuing for attention to L LE & RW & L side of environment with pt continuing to demonstrate impulsivity with task and requiring ongoing assistance for RW management up/down step. Pt completes floor transfer with mod assist with assistance for LE placement and cuing for technique. At end of session pt left in w/c with chair alarm donned & all needs in reach, set up with meal tray.  Pt makes a comment about being home alone after d/c & reports her boyfriend plans to return to work 1 week after pt returns home - educated pt on recommendation of supervision & need to discuss with OPPT to see when pt is safe to be more independent at home.    Treatment 2: Pt  received in bed & agreeable to tx, reporting 6/10 HA but states she's premedicated. Pt transfers to sitting EOB & pt's boyfriend arrives & participates in caregiver training. Therapist and boyfriend assist with donning jacket, shoes & L AFO. Educated [place] on recommendation of pt using RW with hand orthosis at d/c, and progressing to no AD as her balance improves with OPPT. Educated [place] on pt's ability to complete floor transfer with assistance, L inattention, need to move items on high shelves to more accessible locations (ex: countertops), discussed need to secure or remove throw rugs to prevent tripping/fall hazard, and provided pt with RW bag to allow her to transport items. Therapist provides demonstration re: gait and positioning in relation to pt with pt & Jane Watkins return demonstrating with pt requiring close supervision for gait with RW & cuing for increased hip flexion LLE as pt abducts and frequently hits RW with LLE. Also educated pt on proper posture as pt with significant R lateral lean with use of AD. Pt completes car transfer at sedan simulated height with RW & supervision from [place]. Jane Watkins then returns to room as no further caregiver training needs are at this time. Pt utilizes cybex kinetron in standing with RUE support with task focusing on weight shifting L<>R, dynamic balance, and LLE NMR & strengthening. During activity pt reports dizziness that decreases with seated rest break. Pt progressed to using kinetron without BUE support and min assist with ongoing cuing for upright posture and terminal L knee extension. Pt ambulates 100 ft + 20 ft + 20  ft without AD & min assist with therapist providing verbal/tactile cuing for increased weight shifting L. Pt demonstrates ongoing decreased hip/knee strength & still attempts to abduct LLE to bring it forward, with some L knee hyperextension noted in stance phase that greatly decreases with increased weight shifting & weight bearing through LLE during L stance  phase. Pt with intermittent occasional L ankle instability during trial as well. Pt utilized nu-step on level 3 x 5 minutes with BLE only with therapist providing facilitation & assistance for L knee neutral alignment as pt abducts and is unable to maintain neutral without assistance; task focused on LLE strengthening & NMR. Pt returned to room & reports need to use restroom but Jane Watkins checked off to assist her & nurse in room. Pt left in w/c.   Therapy Documentation Precautions:  Precautions Precautions: Fall Precaution Comments: L hemi Restrictions Weight Bearing Restrictions: No     Therapy/Group: Individual Therapy  Waunita Schooner 02/25/2019, 3:34 PM

## 2019-02-25 NOTE — Progress Notes (Signed)
Occupational Therapy Weekly Progress Note  Patient Details  Name: Jane Watkins MRN: [id number] Date of Birth: 22-Oct-1995  Beginning of progress report period: February 17, 2019 End of progress report period: February 25, 2019  Today's Date: 02/25/2019 OT Individual Time: 0930-1030 OT Individual Time Calculation (min): 60 min    Pt is making steady progress toward long term OT goals. Pt is at an overall CGA/min A level for BADL tasks. Pt continues to have dense L UE hemiplegia, but is progressing well with self-care modifications. Continue current POC.  Skilled Therapeutic Interventions/Progress Updates:    Pt greeted in bathroom with nurse tech present, handoff to OT. Pt voided bladder and completed peri-care. Pt then ambulated into shower without AD and min A. Pt doffed clothing sitting on shower seat with increased time and CGA. Worked on L UE NMR with hand over hand A to wash body parts. Slight scap activation noted. Worked on hemi-dressing strategies at the sink with pt able to don shirt with increased time and supervision. Pt needed min A to don tight leggings. OT discussed pt could be more independent with looser fitting clothing. OT provided pt with shoe buttons and educated on fastening shoes. Educated on weight bearing strategies when standing at the sink or at a counter level. Pt demonstrated understanding. Pt completed stand-pivot back to bed with CGA, she doffed shoes and AFO with supervision then returned to supine. OT placed SAEBO e-stim. SAEBO left on for 60 minutes. OT returned to remove SAEBO with skin intact and no adverse reactions.  [place] Stim One 330 pulse width 35 Hz pulse rate On 8 sec/ off 8 sec Ramp up/ down 2 sec Symmetrical Biphasic wave form  Max intensity at 500 Ohm load   Therapy Documentation Precautions:  Precautions Precautions: Fall Precaution Comments: L hemi Restrictions Weight Bearing Restrictions: No Pain:  denies pain  Therapy/Group: Individual  Therapy  [name] 02/25/2019, 10:36 AM

## 2019-02-25 NOTE — Progress Notes (Signed)
[facility] PHYSICAL MEDICINE & REHABILITATION PROGRESS NOTE   Subjective/Complaints: Up in chair. In fairly good spirits. A little anxious about going home and what lies ahead for her. Spasms and headaches better  ROS: Patient denies fever, rash, sore throat, blurred vision, nausea, vomiting, diarrhea, cough, shortness of breath or chest pain, joint or back pain, headache, or mood change.   Objective:   CT HEAD WO CONTRAST  Result Date: 02/23/2019 CLINICAL DATA:  Initial evaluation for recent fall, struck head. EXAM: CT HEAD WITHOUT CONTRAST TECHNIQUE: Contiguous axial images were obtained from the base of the skull through the vertex without intravenous contrast. COMPARISON:  Comparison made with previous MRI from 02/07/2019. FINDINGS: Brain: Continued interval evolution of previously identified infarcts involving the right cerebral hemisphere, stable in size and distribution as compared to previous. Associated edema and regional mass effect has mildly increased from previous, with progressive 5 mm of right-to-left shift. Right lateral ventricle is partially effaced. No hydrocephalus or ventricular trapping. Basilar cisterns remain patent. Patchy hyperdensity at the level of the right basal ganglia consistent with petechial hemorrhage, similar to previous. No frank hemorrhagic transformation. No other new acute intracranial hemorrhage or large vessel territory infarct. No mass lesion. No extra-axial fluid collection. Vascular: No hyperdense vessel. Skull: Scalp soft tissues and calvarium within normal limits. Sinuses/Orbits: Globes and orbital soft tissues within normal limits. Retention cyst present at the left frontal sinus. Paranasal sinuses are otherwise largely clear. No mastoid effusion. Other: None. IMPRESSION: 1. Continued interval evolution of subacute ischemic infarcts involving the right cerebral hemisphere with progressive regional mass effect. Resultant 5 mm right-to-left midline shift  increased from previous. No hydrocephalus or ventricular trapping. Associated petechial hemorrhage at the right basal ganglia unchanged. No frank hemorrhagic transformation. 2. No other new acute intracranial abnormality. No acute traumatic injury status post recent fall. Electronically Signed   By: Rise Mu M.D.   On: 02/23/2019 22:30   No results for input(s): WBC, HGB, HCT, PLT in the last 72 hours. Recent Labs    02/24/19 0508  NA 142  K 4.0  CL 113*  CO2 21*  GLUCOSE 94  BUN 29*  CREATININE 0.75  CALCIUM 9.2    Intake/Output Summary (Last 24 hours) at 02/25/2019 1205 Last data filed at 02/24/2019 1828 Gross per 24 hour  Intake 840 ml  Output --  Net 840 ml     Physical Exam: Vital Signs Blood pressure 106/68, pulse 76, temperature 97.9 F (36.6 C), resp. rate 16, last menstrual period 01/31/2019, SpO2 100 %. Constitutional: No distress . Vital signs reviewed. HEENT: EOMI, oral membranes moist Neck: supple Cardiovascular: RRR without murmur. No JVD    Respiratory: CTA Bilaterally without wheezes or rales. Normal effort    GI: BS +, non-tender, non-distended  Musculoskeletal:     Comments: No edema or tenderness in extremities  Neurological: very alert. Oriented x 3.  Motor: RUE/RLE: 5/5 proximal to distal LUE: Trace proximal to distal-- no change LLE: HF, KE 2/5, ADF trace/5- APF 1 to 1+/5. Heel cord still a bit tight Tone: MAS 1+ LLE gastroc/trace hamstrings, and trace to absent LUE biceps  Skin: Skin is warm and dry.  Psychiatric: makes good eye contact. Much more engaging  Assessment/Plan: 1. Functional deficits secondary to right MCA infarction which require 3+ hours per day of interdisciplinary therapy in a comprehensive inpatient rehab setting.  Physiatrist is providing close team supervision and 24 hour management of active medical problems listed below.  Physiatrist and rehab team  continue to assess barriers to discharge/monitor patient progress  toward functional and medical goals  Care Tool:  Bathing    Body parts bathed by patient: Left arm, Chest, Abdomen, Front perineal area, Buttocks, Right upper leg, Left upper leg, Face, Right arm, Right lower leg, Left lower leg   Body parts bathed by helper: Left lower leg, Right lower leg, Right arm     Bathing assist Assist Level: Contact Guard/Touching assist     Upper Body Dressing/Undressing Upper body dressing   What is the patient wearing?: Pull over shirt, Bra    Upper body assist Assist Level: Minimal Assistance - Patient > 75%    Lower Body Dressing/Undressing Lower body dressing      What is the patient wearing?: Pants, Underwear/pull up     Lower body assist Assist for lower body dressing: Minimal Assistance - Patient > 75%     Toileting Toileting Toileting Activity did not occur (Clothing management and hygiene only): N/A (no void or bm)  Toileting assist Assist for toileting: Minimal Assistance - Patient > 75%     Transfers Chair/bed transfer  Transfers assist     Chair/bed transfer assist level: Contact Guard/Touching assist     Locomotion Ambulation   Ambulation assist      Assist level: Contact Guard/Touching assist Assistive device: No Device Max distance: 150   Walk 10 feet activity   Assist     Assist level: Contact Guard/Touching assist Assistive device: No Device   Walk 50 feet activity   Assist    Assist level: Contact Guard/Touching assist Assistive device: No Device    Walk 150 feet activity   Assist Walk 150 feet activity did not occur: Safety/medical concerns  Assist level: Contact Guard/Touching assist Assistive device: No Device    Walk 10 feet on uneven surface  activity   Assist     Assist level: 2 helpers     Wheelchair     Assist Will patient use wheelchair at discharge?: No             Wheelchair 50 feet with 2 turns activity    Assist            Wheelchair 150  feet activity     Assist          Blood pressure 106/68, pulse 76, temperature 97.9 F (36.6 C), resp. rate 16, last menstrual period 01/31/2019, SpO2 100 %.  Medical Problem List and Plan: 1.  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 She said her boyfriend is clean. I asked her if she had ongoing thoughts of harming herself, and she told me no. She wants to get back to work and get past her substance abuse problem. She will need ongoing family/professional support re: substance abuse for sure once she's home.  2.  Antithrombotics: -DVT/anticoagulation: Subcutaneous Lovenox             -antiplatelet therapy: Aspirin 81  mg daily and Plavix 75 mg daily until 03/02/2019 and then aspirin alone 3. Pain Management: Tylenol as needed  1/10 Increased Topamax to 50 mg twice daily---improved  1/8 -spastic left hemiparesis---ROM/splinting  1/11: tizanidine increased to 2mg  bid  1/13: added Lidocaine patch to left shoulder.   1/15 pain much improved 4. Mood:   1/12 -shift seroquel to 75mg  qhs for better sleep   -increased prozac to 40mg  for depression   -appreciate Dr. 3/12 input. Concerned about pt's suicidal ideations    -antipsychotic agents: seroquel  1/15 -continue am prozac and hs seroquel 5. Neuropsych: This patient is capable of making decisions on her own behalf. 6. Skin/Wound Care: Routine skin checks 7. Fluids/Electrolytes/Nutrition: encourage PO  1/7 -BUN sl elevated  1/14-   BUN improved   8.  Polysubstance abuse as well as tobacco  abuse.  Provide counseling  -will need intensive outpt follow up given history (see above) 9.  Hyperlipidemia.  Continue Lipitor    LOS: 9 days A FACE TO FACE EVALUATION WAS PERFORMED  2/15 02/25/2019, 12:05 PM

## 2019-02-25 NOTE — Progress Notes (Signed)
  Patient ID: Jane Watkins, female   DOB: 12-Aug-1995, 24 y.o.   MRN: [id number]    Diagnosis codes:  I63.511;  I69.354  Height:       5'5"          Weight:     130 lbs       Patient suffers from CVA with left hemiplegia which impairs her ability to perform daily activities like bathing, dressing and mobility in the home.  A walker will not resolve issue with performing activities of daily living.  A wheelchair will allow patient to safely perform daily activities.  Patient is not able to propel themselves in the home using a standard weight wheelchair due to  hemiplegia.  Patient can self propel in the lightweight wheelchair.   Mariam Dollar, PA-C

## 2019-02-26 ENCOUNTER — Inpatient Hospital Stay (HOSPITAL_COMMUNITY): Payer: Medicaid Other | Admitting: Physical Therapy

## 2019-02-26 ENCOUNTER — Inpatient Hospital Stay (HOSPITAL_COMMUNITY): Payer: Medicaid Other

## 2019-02-26 DIAGNOSIS — G8110 Spastic hemiplegia affecting unspecified side: Secondary | ICD-10-CM

## 2019-02-26 DIAGNOSIS — R799 Abnormal finding of blood chemistry, unspecified: Secondary | ICD-10-CM

## 2019-02-26 DIAGNOSIS — G441 Vascular headache, not elsewhere classified: Secondary | ICD-10-CM

## 2019-02-26 NOTE — Progress Notes (Signed)
[facility] PHYSICAL MEDICINE & REHABILITATION PROGRESS NOTE   Subjective/Complaints: Patient seen laying in bed this morning.  She states she slept well overnight.  Limited engagement.  ROS: Denies CP, SOB, N/V/D  Objective:   No results found. No results for input(s): WBC, HGB, HCT, PLT in the last 72 hours. Recent Labs    02/24/19 0508  NA 142  K 4.0  CL 113*  CO2 21*  GLUCOSE 94  BUN 29*  CREATININE 0.75  CALCIUM 9.2    Intake/Output Summary (Last 24 hours) at 02/26/2019 1102 Last data filed at 02/26/2019 0900 Gross per 24 hour  Intake 720 ml  Output -  Net 720 ml     Physical Exam: Vital Signs Blood pressure (!) 96/57, pulse 72, temperature 98.1 F (36.7 C), temperature source Oral, resp. rate 16, last menstrual period 01/31/2019, SpO2 100 %. Constitutional: No distress . Vital signs reviewed. HENT: Normocephalic.  Atraumatic. Eyes: EOMI. No discharge. Cardiovascular: No JVD. Respiratory: Normal effort.  No stridor. GI: Non-distended. Skin: Warm and dry.  Intact. Psych: Flat. Musc: No edema in extremities.  No tenderness in extremities. Neurological: Alert Motor: RUE/RLE: 5/5 proximal to distal, unchanged LUE: 0/5 proximal distal LLE: HF, KE 2/5, 0/5 ADF  Tone: MAS 1+ LLE gastroc/trace hamstrings, and LUE elbow flexors  Assessment/Plan: 1. Functional deficits secondary to right MCA infarction which require 3+ hours per day of interdisciplinary therapy in a comprehensive inpatient rehab setting.  Physiatrist is providing close team supervision and 24 hour management of active medical problems listed below.  Physiatrist and rehab team continue to assess barriers to discharge/monitor patient progress toward functional and medical goals  Care Tool:  Bathing    Body parts bathed by patient: Left arm, Chest, Abdomen, Front perineal area, Buttocks, Right upper leg, Left upper leg, Face, Right arm, Right lower leg, Left lower leg   Body parts bathed by  helper: Left lower leg, Right lower leg, Right arm     Bathing assist Assist Level: Contact Guard/Touching assist     Upper Body Dressing/Undressing Upper body dressing   What is the patient wearing?: Pull over shirt, Bra    Upper body assist Assist Level: Minimal Assistance - Patient > 75%    Lower Body Dressing/Undressing Lower body dressing      What is the patient wearing?: Pants, Underwear/pull up     Lower body assist Assist for lower body dressing: Minimal Assistance - Patient > 75%     Toileting Toileting Toileting Activity did not occur (Clothing management and hygiene only): N/A (no void or bm)  Toileting assist Assist for toileting: Minimal Assistance - Patient > 75%     Transfers Chair/bed transfer  Transfers assist     Chair/bed transfer assist level: Contact Guard/Touching assist     Locomotion Ambulation   Ambulation assist      Assist level: Minimal Assistance - Patient > 75% Assistive device: Orthosis Max distance: 100 ft   Walk 10 feet activity   Assist     Assist level: Minimal Assistance - Patient > 75% Assistive device: Orthosis   Walk 50 feet activity   Assist    Assist level: Minimal Assistance - Patient > 75% Assistive device: Orthosis    Walk 150 feet activity   Assist Walk 150 feet activity did not occur: Safety/medical concerns  Assist level: Contact Guard/Touching assist Assistive device: No Device    Walk 10 feet on uneven surface  activity   Assist     Assist  level: 2 helpers     Materials engineer Will patient use wheelchair at discharge?: No             Wheelchair 50 feet with 2 turns activity    Assist            Wheelchair 150 feet activity     Assist          Blood pressure (!) 96/57, pulse 72, temperature 98.1 F (36.7 C), temperature source Oral, resp. rate 16, last menstrual period 01/31/2019, SpO2 100 %.  Medical Problem List and Plan: 1.  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  Antithrombotics: -DVT/anticoagulation: Subcutaneous Lovenox             -antiplatelet therapy: Aspirin 81 mg daily and Plavix 75 mg daily until 03/02/2019 and then aspirin alone 3. Pain Management: Tylenol as needed  1/10 Increased Topamax to 50 mg twice daily---improved  1/8 -spastic left hemiparesis---ROM/splinting   Consider medications/injections as outpatient if necessary  1/11: tizanidine increased to 2mg  bid  1/13: added Lidocaine patch to left shoulder.   Controlled with meds on 1/16   4. Mood:   1/12 -seroquel to 75mg  qhs for better sleep   -increased prozac to 40mg  for depression   -appreciate Dr. 3/12 input. Concerned about pt's suicidal ideations   -antipsychotic agents: seroquel 5. Neuropsych: This patient is capable of making decisions on her own behalf. 6. Skin/Wound Care: Routine skin checks 7. Fluids/Electrolytes/Nutrition: encourage PO  BUN elevated on 1/14 8.  Polysubstance abuse as well as tobacco abuse.  Provide counseling  -will need intensive outpt follow up given history (see above) 9.  Hyperlipidemia.  Continue Lipitor    LOS: 10 aspirin alone okay okay thank you: 39 A FACE TO FACE EVALUATION WAS PERFORMED  Jane Watkins 02/26/2019, 11:02 AM

## 2019-02-26 NOTE — Progress Notes (Signed)
Physical Therapy Session Note  Patient Details  Name: Jane Watkins MRN: [id number] Date of Birth: March 15, 1995  Today's Date: 02/26/2019 PT Individual Time: 7944-4619 PT Individual Time Calculation (min): 55 min   Short Term Goals: Week 1:  PT Short Term Goal 1 (Week 1): = LTGs overall supervision Week 2:  PT Short Term Goal 1 (Week 2): STG = LTG due to estimated d/c date.  Skilled Therapeutic Interventions/Progress Updates:   Pt received supine in bed and agreeable to PT. Supine>sit transfer without assist. Min cues for scooting to EOB, to allow SO to don shoes and AFO.    Gait training with AFO and RW over level surface x 14f, 1538fand [id number] progressing to supervision assist. Cues for improved step height/hip flexion to prevent foot drag on the L as well as improved AD management in turns. Dynamic gait training to weave through 8 cones x 3 with supervision assist and occasional cues for improved visual scanning to the L to prevent hitting RW on against cone. Pt also instructed in stepping over 1 inch hockey sticks with RW x 15. Min assist overall to navigate front L wheel over obstacles throughout. Moderate cues for safety and problem solving to improve sequencing and keep BLE within BOS of RW; due to strength deficits pt unable to navigate over cane without assist from PT to manage RW.    Balance trainnig with Wii sports and no UE support x 10 frames. Supervision assist overall from PT with one near LOB, but pt able to correct without additional assist from PT. Min cues for increased BOS width and equal WB through BLE to force use of LLE.    Patient returned to room and left sitting EOB with SO present, call bell in reach, and all needs met.           Therapy Documentation Precautions:  Precautions Precautions: Fall Precaution Comments: L hemi Restrictions Weight Bearing Restrictions: No    Vital Signs: Therapy Vitals Pulse Rate: 74 Resp: 20 BP: (!) 84/41(rn  notified) Patient Position (if appropriate): Lying Oxygen Therapy SpO2: 97 % O2 Device: Room Air Pain: denies  Therapy/Group: Individual Therapy  AusLorie Phenix16/2021, 4:45 PM

## 2019-02-26 NOTE — Progress Notes (Signed)
Occupational Therapy Session Note  Patient Details  Name: Jane Watkins MRN: [id number] Date of Birth: 07/08/1995  Today's Date: 02/26/2019 OT Individual Time: 1007-1100 OT Individual Time Calculation (min): 53 min    Short Term Goals: Week 1:  OT Short Term Goal 1 (Week 1): LTG=STG 2/2 ELOS  Skilled Therapeutic Interventions/Progress Updates:    1;1. Pt received in bed initially agreeable to shower, however after set up and realization that pt does not have clean undergarments, pt elects to wait till tomorrow. Pt completes stand pivot transfer with no AD and CGA to w/c and similar transfer using grab bar to toilet with S. Pt able to manage clothing and hygiene with S for toileting in standing. Pt washes hands with set up. Grooming completed with HOH A to incorporate LUE into task by grasping toothpaste for NMR. Pt completes stand pivot transfer as stated above to mat table in supine to complete LUE NMR in supine/sidelyig for shoulder/elbow, flex/ext, int/ext rotation, wrist flex/ext, digit flex/ext and ab/adduciton. Pt with trace activation with shoulder/elbow ext/flexion and adduction of shoulder. Exited session with pt seated in bed with saebo estim on wrist extensors for 60 min unattended, exit alarm on and call light in reach. Skin in tact upon return, no redness.  Saebo Stim One 330 pulse width 35 Hz pulse rate On 8 sec/ off 8 sec Ramp up/ down 2 sec Symmetrical Biphasic wave form  Max intensity at 500 Ohm load   Therapy Documentation Precautions:  Precautions Precautions: Fall Precaution Comments: L hemi Restrictions Weight Bearing Restrictions: No General:   Vital Signs:  Pain:   ADL: ADL Eating: Set up Grooming: Setup Where Assessed-Grooming: Sitting at sink Upper Body Bathing: Supervision/safety(using long handled sponge) Where Assessed-Upper Body Bathing: Shower Lower Body Bathing: Contact guard Where Assessed-Lower Body Bathing: Shower Upper Body Dressing:  Minimal assistance Where Assessed-Upper Body Dressing: Wheelchair Lower Body Dressing: Moderate assistance Where Assessed-Lower Body Dressing: Wheelchair Toileting: Minimal assistance Toilet Transfer: Furniture conservator/restorer Method: Surveyor, minerals: Engineer, technical sales: Moderate assistance Film/video editor: Administrator, arts Method: Warden/ranger: Grab bars, Shower seat with back Clinical research associate   Exercises:   Other Treatments:     Therapy/Group: Individual Therapy  Shon Duchesneau 02/26/2019, 10:56 AM

## 2019-02-27 ENCOUNTER — Inpatient Hospital Stay (HOSPITAL_COMMUNITY): Payer: Medicaid Other | Admitting: Occupational Therapy

## 2019-02-27 ENCOUNTER — Inpatient Hospital Stay (HOSPITAL_COMMUNITY): Payer: Medicaid Other

## 2019-02-27 NOTE — Progress Notes (Signed)
Social Work Patient ID: Jane Watkins, female   DOB: [birth year], 24 y.o.   MRN: [id number]  Pt d/c date changed to 1/18 following therapies as well as session with neuropsychology.  Grissel Tyrell, LCSW

## 2019-02-27 NOTE — Progress Notes (Signed)
[facility] PHYSICAL MEDICINE & REHABILITATION PROGRESS NOTE   Subjective/Complaints: Patient seen laying in bed this AM.  She states she slept well overnight.  She is looking forward to discharge tomorrow.   ROS: Denies CP, SOB, N/V/D  Objective:   No results found. No results for input(s): WBC, HGB, HCT, PLT in the last 72 hours. No results for input(s): NA, K, CL, CO2, GLUCOSE, BUN, CREATININE, CALCIUM in the last 72 hours.  Intake/Output Summary (Last 24 hours) at 02/27/2019 2000 Last data filed at 02/27/2019 1749 Gross per 24 hour  Intake 1040 ml  Output --  Net 1040 ml     Physical Exam: Vital Signs Blood pressure 103/63, pulse 82, temperature 98.2 F (36.8 C), temperature source Oral, resp. rate 18, height 5\' 5"  (1.651 m), last menstrual period 01/31/2019, SpO2 100 %. Constitutional: No distress . Vital signs reviewed. HENT: Normocephalic.  Atraumatic. Eyes: EOMI. No discharge. Cardiovascular: No JVD. Respiratory: Normal effort.  No stridor. GI: Non-distended. Skin: Warm and dry.  Intact. Psych: Flat. Musc: No edema in extremities.  No tenderness in extremities. Neurological: Alert Motor:  LUE: 0/5 proximal distal, unchanged LLE: HF, KE 2/5, 0/5 ADF  MAS: 1+ LLE gastroc/trace hamstrings, and LUE elbow flexors  Assessment/Plan: 1. Functional deficits secondary to right MCA infarction which require 3+ hours per day of interdisciplinary therapy in a comprehensive inpatient rehab setting.  Physiatrist is providing close team supervision and 24 hour management of active medical problems listed below.  Physiatrist and rehab team continue to assess barriers to discharge/monitor patient progress toward functional and medical goals  Care Tool:  Bathing    Body parts bathed by patient: Left arm, Chest, Abdomen, Front perineal area, Buttocks, Right upper leg, Left upper leg, Face, Right arm, Right lower leg, Left lower leg   Body parts bathed by helper: Left lower leg,  Right lower leg, Right arm     Bathing assist Assist Level: Contact Guard/Touching assist     Upper Body Dressing/Undressing Upper body dressing   What is the patient wearing?: Pull over shirt, Bra    Upper body assist Assist Level: Minimal Assistance - Patient > 75%    Lower Body Dressing/Undressing Lower body dressing      What is the patient wearing?: Pants, Underwear/pull up     Lower body assist Assist for lower body dressing: Minimal Assistance - Patient > 75%     Toileting Toileting Toileting Activity did not occur (Clothing management and hygiene only): N/A (no void or bm)  Toileting assist Assist for toileting: Minimal Assistance - Patient > 75%     Transfers Chair/bed transfer  Transfers assist     Chair/bed transfer assist level: Supervision/Verbal cueing     Locomotion Ambulation   Ambulation assist      Assist level: Supervision/Verbal cueing Assistive device: Walker-rolling Max distance: 150   Walk 10 feet activity   Assist     Assist level: Supervision/Verbal cueing Assistive device: Walker-rolling, Orthosis   Walk 50 feet activity   Assist    Assist level: Supervision/Verbal cueing Assistive device: Orthosis, Walker-rolling    Walk 150 feet activity   Assist Walk 150 feet activity did not occur: Safety/medical concerns  Assist level: Supervision/Verbal cueing Assistive device: Walker-rolling, Orthosis    Walk 10 feet on uneven surface  activity   Assist     Assist level: Contact Guard/Touching assist Assistive device: Cane-quad   Wheelchair     Assist Will patient use wheelchair at discharge?: No  Wheelchair 50 feet with 2 turns activity    Assist            Wheelchair 150 feet activity     Assist          Blood pressure 103/63, pulse 82, temperature 98.2 F (36.8 C), temperature source Oral, resp. rate 18, height 5\' 5"  (1.651 m), last menstrual period 01/31/2019, SpO2  100 %.  Medical Problem List and Plan: 1.  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  Antithrombotics: -DVT/anticoagulation: Subcutaneous Lovenox             -antiplatelet therapy: Aspirin 81 mg daily and Plavix 75 mg daily until 03/02/2019 and then aspirin alone 3. Pain Management: Tylenol as needed  1/10 Increased Topamax to 50 mg twice daily---improved  1/8 -spastic left hemiparesis---ROM/splinting   Consider medications/injections as outpatient if necessary, stable at present  1/11: tizanidine increased to 2mg  bid  1/13: added Lidocaine patch to left shoulder.   Controlled with meds on 1/17  4. Mood:   1/12 -seroquel to 75mg  qhs for better sleep   -increased prozac to 40mg  for depression   -appreciate Dr. 2/17 input. Concerned about pt's suicidal ideations   -antipsychotic agents: seroquel 5. Neuropsych: This patient is capable of making decisions on her own behalf. 6. Skin/Wound Care: Routine skin checks 7. Fluids/Electrolytes/Nutrition: encourage PO  BUN elevated on 1/14 8.  Polysubstance abuse as well as tobacco abuse.  Continues to counsel   -will need intensive outpt follow up given history (see above) 9.  Hyperlipidemia.  Continue Lipitor    LOS: 11 aspirin alone okay okay thank you: 39 A FACE TO FACE EVALUATION WAS PERFORMED  Sylvester Salonga 02/27/2019, 8:00 PM

## 2019-02-27 NOTE — Progress Notes (Signed)
Physical Therapy Session Note  Patient Details  Name: Jane Watkins MRN: [id number] Date of Birth: 11-Dec-1995  Today's Date: 02/27/2019 PT Individual Time: 1100-1210 PT Individual Time Calculation (min): 70 min   Short Term Goals: Week 1:  PT Short Term Goal 1 (Week 1): = LTGs overall supervision Week 2:  PT Short Term Goal 1 (Week 2): STG = LTG due to estimated d/c date. Week 3:     Skilled Therapeutic Interventions/Progress Updates:    PAIN denies pain this am Pt initially seated on edge of bed.  Therapist assisted pt w/donning AFO/shoes, adjusted laces to tighten due to improved stability w/AFO.  SPT to wc w/supervision.   Pt transported to gym for functional training including: Gait 134ft w/RW w/2 turns w/supervision, clear environment, level surfaces. Stairs:  Ascends/descends w/single rail and min assist due to occasional difficulty clearing L foot w/ascending Car transfer:  Using RW performs w/ supervision, without AD performed w/close supervision Gait on uneven surfaces:  47ft w/RW and min assist to manage RW, walker becomes stuck in mulch, pt attempts to advance by kicking w/L foot w/limited success, unable to turn w/rw  Instructed pt w/use of sbqc for gait.  Gait 131ft w/SBQC and close supervision, uses step thru gait pattern. Repeated gait on uneven surfaces using sbqc x 40ft w/cga for balance only including turning w/cane. Ramp:  Ascends/descends w/both RW and repeated w/sbqc w/supervision, required assist w/RW only to manage lip at base of ramp.   HEP:  Pt instructed w/and performed below HEP.  Provided w/written instructions and weblink page.    Access Code: [email or web address]  URL: [email or web address]  Date: 02/27/2019  Prepared by: Rada Hay   Exercises Bent Knee Fallouts - 10 reps - 2 sets - 1x daily - 7x weekly Single Leg Bridge - 10 reps - 2 sets - 1x daily - 7x weekly Sit to Stand - 10 reps - 2 sets - 1x daily - 7x weekly Side Stepping with Counter  Support - 10 reps - 2 sets - 1x daily - 7x weekly Standing Tandem Balance with Counter Support - 5 reps - 2 sets - 20-30 hold - 1x daily - 7x weekly   Caregiver training - boyfriend present last of session.  Instructed [name]/boyfriend w/safe guarding for gait to/fro bathroom using RW and sbqc.  Discussed use of RW at home for safety primarily due to inattention.  Patient voiced concern w/difficulty managing in tight spaces and uneven surfaces w/rw.  Discussed progression to sbqc w/OPT.  Patient stated dad purchasing hemiwalker, but advised against this due to safety w/RW and likelihood of being able to use less restrictive device in near future.     Therapy Documentation Precautions:  Precautions Precautions: Fall Precaution Comments: L hemi Restrictions Weight Bearing Restrictions: No    Therapy/Group: Individual Therapy  Rada Hay, PT   Shearon Balo 02/27/2019, 1:56 PM

## 2019-02-27 NOTE — Progress Notes (Signed)
Occupational Therapy Session Note  Patient Details  Name: Jane Watkins MRN: [id number] Date of Birth: 1995/09/23  Today's Date: 02/27/2019 OT Individual Time: 1455-1537 OT Individual Time Calculation (min): 42 min   Short Term Goals: Week 2:  OT Short Term Goal 1 (Week 2): LTG=STG 2/2 ELOS      Skilled Therapeutic Interventions/Progress Updates:    Pt greeted in bed, requesting to shower. Started session with ambulatory transfer to TTB using quad cane with steady assist (after shoes and Lt AFO were donned). Pt then bathed at sit<stand level from shower chair. Steady assist for dynamic standing balance with facilitation for Lt hand placement on grab bar. Taught her one-handed techniques to wash Rt side. Since pts w/c was missing, she donned shoes + Lt AFO again with assist and ambulated to recliner to dress using her quad cane. Boyfriend [name] stepped in to help her dress. Encouraged pt to dress herself as much as possible to increase her functional independence with minimal carryover of education. Left pt in recliner with [name] present. Tx focus placed on NMR, functional ambulation, functional transfers, and ADL retraining.   Therapy Documentation Precautions:  Precautions Precautions: Fall Precaution Comments: L hemi Restrictions Weight Bearing Restrictions: No Vital Signs: Therapy Vitals Temp: 98.2 F (36.8 C) Temp Source: Oral Pulse Rate: 82 Resp: 18 BP: 103/63 Patient Position (if appropriate): Lying Oxygen Therapy SpO2: 100 % O2 Device: Room Air Pain: No c/o pain during session    ADL: ADL Eating: Set up Grooming: Setup Where Assessed-Grooming: Sitting at sink Upper Body Bathing: Supervision/safety(using long handled sponge) Where Assessed-Upper Body Bathing: Shower Lower Body Bathing: Contact guard Where Assessed-Lower Body Bathing: Shower Upper Body Dressing: Minimal assistance Where Assessed-Upper Body Dressing: Wheelchair Lower Body Dressing: Moderate  assistance Where Assessed-Lower Body Dressing: Wheelchair Toileting: Minimal assistance Toilet Transfer: Furniture conservator/restorer Method: Surveyor, minerals: Engineer, technical sales: Moderate assistance Film/video editor: Administrator, arts Method: Warden/ranger: Grab bars, Shower seat with back      Therapy/Group: Individual Therapy  Barnard Sharps A Darnise Montag 02/27/2019, 5:19 PM

## 2019-02-27 NOTE — Discharge Summary (Signed)
Physician Discharge Summary  Patient ID: Jane Watkins MRN: [id number] DOB/AGE: 1995-05-06 24 y.o.  Admit date: 02/16/2019 Discharge date: 02/28/2019  Discharge Diagnoses:  Principal Problem:   Right middle cerebral artery stroke [facility]) Active Problems:   Mood disorder in conditions classified elsewhere   Spastic hemiparesis (HCC)   Elevated BUN   Vascular headache Pain management DVT prophylaxis Polysubstance abuse Hyperlipidemia  Discharged Condition: Stable  Significant Diagnostic Studies: CT ANGIO HEAD W OR WO CONTRAST  Result Date: 02/07/2019 CLINICAL DATA:  Stroke follow-up. Acute right MCA infarct on MRI. EXAM: CT ANGIOGRAPHY HEAD AND NECK TECHNIQUE: Multidetector CT imaging of the head and neck was performed using the standard protocol during bolus administration of intravenous contrast. Multiplanar CT image reconstructions and MIPs were obtained to evaluate the vascular anatomy. Carotid stenosis measurements (when applicable) are obtained utilizing NASCET criteria, using the distal internal carotid diameter as the denominator. CONTRAST:  75mL OMNIPAQUE IOHEXOL 350 MG/ML SOLN COMPARISON:  Head MRI 02/07/2019 FINDINGS: CT HEAD FINDINGS Brain: Hypodensity involving the right basal ganglia as well as cortex and subcortical white matter in the right frontal and parietal lobes corresponds to acute infarcts on today's earlier MRI. No acute intracranial hemorrhage, mass, midline shift, or extra-axial fluid collection is identified. Right basal ganglia edema effaces the frontal horn of the right lateral ventricle. There is no ventricular dilatation. Vascular: No hyperdense vessel. Skull: No fracture or focal osseous lesion. Sinuses: Paranasal sinuses and mastoid air cells are clear. Orbits: Unremarkable. Review of the MIP images confirms the above findings CTA NECK FINDINGS Aortic arch: Standard 3 vessel aortic arch with widely patent arch vessel origins. Right carotid system: Patent and smooth  without evidence of stenosis or dissection. Left carotid system: Patent and smooth without evidence of stenosis or dissection. Vertebral arteries: Patent, codominant, and smooth without evidence of stenosis or dissection. Skeleton: No acute osseous abnormality or suspicious osseous lesion. Other neck: No evidence of cervical lymphadenopathy or mass. Upper chest: No apical lung consolidation or mass. Review of the MIP images confirms the above findings CTA HEAD FINDINGS Anterior circulation: The internal carotid arteries are widely patent from skull base to carotid termini. ACAs and MCAs are patent without evidence of proximal branch occlusion or significant proximal stenosis. No aneurysm is identified. Posterior circulation: The intracranial vertebral arteries are widely patent to the basilar. The right PICA and left AICA appear dominant. Patent SCAs are seen bilaterally. The basilar artery is widely patent. There are right larger than left posterior communicating arteries. Both PCAs are patent without evidence of significant proximal stenosis. No aneurysm is identified. Venous sinuses: Patent. Anatomic variants: None. Review of the MIP images confirms the above findings IMPRESSION: 1. No major arterial occlusion or significant stenosis in the head and neck. 2. Acute right cerebral infarcts as described on MRI. Electronically Signed   By: Sebastian Ache M.D.   On: 02/07/2019 16:21   DG Chest 1 View  Result Date: 02/07/2019 CLINICAL DATA:  Altered mental status. Additional history provided: Brought in by ambulance, unresponsive. EXAM: CHEST  1 VIEW COMPARISON:  Chest radiograph 04/30/2016 FINDINGS: A previously demonstrated tracheostomy tube is no longer seen. Heart size within normal limits. There is no airspace consolidation within the lungs. No evidence of pleural effusion or pneumothorax. No acute bony abnormality is identified. Overlying cardiac monitoring leads. IMPRESSION: No evidence of acute  cardiopulmonary abnormality. Electronically Signed   By: Jackey Loge DO   On: 02/07/2019 09:59   CT HEAD WO CONTRAST  Result Date: 02/23/2019  CLINICAL DATA:  Initial evaluation for recent fall, struck head. EXAM: CT HEAD WITHOUT CONTRAST TECHNIQUE: Contiguous axial images were obtained from the base of the skull through the vertex without intravenous contrast. COMPARISON:  Comparison made with previous MRI from 02/07/2019. FINDINGS: Brain: Continued interval evolution of previously identified infarcts involving the right cerebral hemisphere, stable in size and distribution as compared to previous. Associated edema and regional mass effect has mildly increased from previous, with progressive 5 mm of right-to-left shift. Right lateral ventricle is partially effaced. No hydrocephalus or ventricular trapping. Basilar cisterns remain patent. Patchy hyperdensity at the level of the right basal ganglia consistent with petechial hemorrhage, similar to previous. No frank hemorrhagic transformation. No other new acute intracranial hemorrhage or large vessel territory infarct. No mass lesion. No extra-axial fluid collection. Vascular: No hyperdense vessel. Skull: Scalp soft tissues and calvarium within normal limits. Sinuses/Orbits: Globes and orbital soft tissues within normal limits. Retention cyst present at the left frontal sinus. Paranasal sinuses are otherwise largely clear. No mastoid effusion. Other: None. IMPRESSION: 1. Continued interval evolution of subacute ischemic infarcts involving the right cerebral hemisphere with progressive regional mass effect. Resultant 5 mm right-to-left midline shift increased from previous. No hydrocephalus or ventricular trapping. Associated petechial hemorrhage at the right basal ganglia unchanged. No frank hemorrhagic transformation. 2. No other new acute intracranial abnormality. No acute traumatic injury status post recent fall. Electronically Signed   By: Rise MuBenjamin  McClintock  M.D.   On: 02/23/2019 22:30   CT Head Wo Contrast  Result Date: 02/07/2019 CLINICAL DATA:  Unresponsive.  Right arm pain. EXAM: CT HEAD WITHOUT CONTRAST TECHNIQUE: Contiguous axial images were obtained from the base of the skull through the vertex without intravenous contrast. COMPARISON:  CT head 04/19/2016. MRI head 04/22/2016. FINDINGS: Brain: Area of ill-defined low-density noted in the right basal ganglia. Some mass effect on the right lateral ventricle. No hemorrhage or hydrocephalus. No midline shift. Vascular: No hyperdense vessel or unexpected calcification. Skull: No acute calvarial abnormality. Sinuses/Orbits: Visualized paranasal sinuses and mastoids clear. Orbital soft tissues unremarkable. Other: None IMPRESSION: Area of indistinct low-density in the right basal ganglia with mass effect on the right lateral ventricle. This could reflect acute infarct, less likely mass lesion although this cannot be excluded. Recommend further evaluation with MRI. Electronically Signed   By: Charlett NoseKevin  Dover M.D.   On: 02/07/2019 11:01   CT ANGIO NECK W OR WO CONTRAST  Result Date: 02/07/2019 CLINICAL DATA:  Stroke follow-up. Acute right MCA infarct on MRI. EXAM: CT ANGIOGRAPHY HEAD AND NECK TECHNIQUE: Multidetector CT imaging of the head and neck was performed using the standard protocol during bolus administration of intravenous contrast. Multiplanar CT image reconstructions and MIPs were obtained to evaluate the vascular anatomy. Carotid stenosis measurements (when applicable) are obtained utilizing NASCET criteria, using the distal internal carotid diameter as the denominator. CONTRAST:  75mL OMNIPAQUE IOHEXOL 350 MG/ML SOLN COMPARISON:  Head MRI 02/07/2019 FINDINGS: CT HEAD FINDINGS Brain: Hypodensity involving the right basal ganglia as well as cortex and subcortical white matter in the right frontal and parietal lobes corresponds to acute infarcts on today's earlier MRI. No acute intracranial hemorrhage,  mass, midline shift, or extra-axial fluid collection is identified. Right basal ganglia edema effaces the frontal horn of the right lateral ventricle. There is no ventricular dilatation. Vascular: No hyperdense vessel. Skull: No fracture or focal osseous lesion. Sinuses: Paranasal sinuses and mastoid air cells are clear. Orbits: Unremarkable. Review of the MIP images confirms the above findings CTA  NECK FINDINGS Aortic arch: Standard 3 vessel aortic arch with widely patent arch vessel origins. Right carotid system: Patent and smooth without evidence of stenosis or dissection. Left carotid system: Patent and smooth without evidence of stenosis or dissection. Vertebral arteries: Patent, codominant, and smooth without evidence of stenosis or dissection. Skeleton: No acute osseous abnormality or suspicious osseous lesion. Other neck: No evidence of cervical lymphadenopathy or mass. Upper chest: No apical lung consolidation or mass. Review of the MIP images confirms the above findings CTA HEAD FINDINGS Anterior circulation: The internal carotid arteries are widely patent from skull base to carotid termini. ACAs and MCAs are patent without evidence of proximal branch occlusion or significant proximal stenosis. No aneurysm is identified. Posterior circulation: The intracranial vertebral arteries are widely patent to the basilar. The right PICA and left AICA appear dominant. Patent SCAs are seen bilaterally. The basilar artery is widely patent. There are right larger than left posterior communicating arteries. Both PCAs are patent without evidence of significant proximal stenosis. No aneurysm is identified. Venous sinuses: Patent. Anatomic variants: None. Review of the MIP images confirms the above findings IMPRESSION: 1. No major arterial occlusion or significant stenosis in the head and neck. 2. Acute right cerebral infarcts as described on MRI. Electronically Signed   By: Sebastian Ache M.D.   On: 02/07/2019 16:21   MR  Brain W and Wo Contrast  Result Date: 02/07/2019 CLINICAL DATA:  Altered mental status.  Left-sided weakness. EXAM: MRI HEAD WITHOUT AND WITH CONTRAST TECHNIQUE: Multiplanar, multiecho pulse sequences of the brain and surrounding structures were obtained without and with intravenous contrast. CONTRAST:  53mL GADAVIST GADOBUTROL 1 MMOL/ML IV SOLN COMPARISON:  Head CT same day FINDINGS: Brain: The brainstem and cerebellum are normal. Left cerebral hemispheres normal. There is acute infarction throughout the right basal ganglia. There is acute infarction of the right cortical and subcortical brain in a watershed distribution. Separate small focus of acute infarction at the right parietal cortical brain. Areas of infarction show mild swelling with petechial blood products but no frank hematoma. Mild swelling but no midline shift. No extra-axial collection. After contrast administration, there is no abnormal enhancement to suggest cerebritis. Vascular: Major vessels at the base of the brain show flow. Skull and upper cervical spine: Negative Sinuses/Orbits: Some mucosal disease of the frontal and ethmoid sinuses. Orbits negative. Other: None IMPRESSION: Acute infarction in the right MCA territory affecting the basal ganglia and affecting the cortical and subcortical brain in a largely watershed distribution. 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 Mild swelling of the affected areas with petechial blood products but no frank hematoma. No abnormal enhancement to suggest septic emboli. Electronically Signed   By: Paulina Fusi M.D.   On: 02/07/2019 12:38   ECHO TEE  Result Date: 02/10/2019   TRANSESOPHOGEAL ECHO REPORT   Patient Name:   Jane Watkins Date of Exam: 02/10/2019 Medical Rec #:  [id number]  Height:       65.0 in Accession #:    [id number] Weight:       130.1 lb Date of Birth:  1995/09/03   BSA:          1.65 m Patient Age:    23 years   BP:           126/79 mmHg Patient  Gender: F          HR:           61 bpm. Exam Location:  [facility]  Procedure: Transesophageal Echo, Color Doppler, Cardiac Doppler and Saline            Contrast Bubble Study Indications:     Stroke 434.91  History:         Patient has prior history of Echocardiogram examinations, most                  recent 02/07/2019. Anxiety, migraine , Overdose.  Sonographer:     Cristela Blue RDCS (AE) Referring Phys:  [id number] Lennon Alstrom Diagnosing Phys: Debbe Odea MD  PROCEDURE: The transesophogeal probe was passed through the esophogus of the patient. Image quality was good. The patient developed no complications during the procedure. TEE SUMMARY  There was no evidence of thrombus or vegetation/endocarditis in this study.  IMPRESSIONS  1. Left ventricular ejection fraction, by visual estimation, is 55 to 60%. The left ventricle has normal function. There is no left ventricular hypertrophy.  2. The left ventricle has no regional wall motion abnormalities.  3. Global right ventricle has normal systolic function.The right ventricular size is normal. No increase in right ventricular wall thickness.  4. Left atrial size was normal.  5. Right atrial size was normal.  6. The mitral valve is normal in structure. Trivial mitral valve regurgitation.  7. The tricuspid valve is normal in structure.  8. The aortic valve is normal in structure. Aortic valve regurgitation is not visualized.  9. The pulmonic valve was grossly normal. Pulmonic valve regurgitation is not visualized. FINDINGS  Left Ventricle: Left ventricular ejection fraction, by visual estimation, is 55 to 60%. The left ventricle has normal function. The left ventricle has no regional wall motion abnormalities. The left ventricular internal cavity size was the left ventricle is normal in size. There is no left ventricular hypertrophy. Right Ventricle: The right ventricular size is normal. No increase in right ventricular wall thickness. Global RV systolic function is  has normal systolic function. Left Atrium: Left atrial size was normal in size. Right Atrium: Right atrial size was normal in size Pericardium: There is no evidence of pericardial effusion. Mitral Valve: The mitral valve is normal in structure. Trivial mitral valve regurgitation. Tricuspid Valve: The tricuspid valve is normal in structure. Tricuspid valve regurgitation is trivial. Aortic Valve: The aortic valve is normal in structure. Aortic valve regurgitation is not visualized. Pulmonic Valve: The pulmonic valve was grossly normal. Pulmonic valve regurgitation is not visualized. Aorta: The aortic root is normal in size and structure. Venous: The inferior vena cava was not well visualized. Shunts: Agitated saline contrast was given intravenously to evaluate for intracardiac shunting. Saline contrast bubble study was negative, with no evidence of any interatrial shunt. No ventricular septal defect is seen or detected. There is no evidence of an atrial septal defect. No atrial level shunt detected by color flow Doppler.  Debbe Odea MD Electronically signed by Debbe Odea MD Signature Date/Time: 02/10/2019/10:53:50 AM    Final    ECHOCARDIOGRAM COMPLETE BUBBLE STUDY  Result Date: 02/08/2019   ECHOCARDIOGRAM REPORT   Patient Name:   Jane Watkins Date of Exam: 02/07/2019 Medical Rec #:  [id number]  Height:       62.0 in Accession #:    [id number] Weight:       130.1 lb Date of Birth:  May 28, 1995   BSA:          1.59 m Patient Age:    23 years   BP:           113/76  mmHg Patient Gender: F          HR:           85 bpm. Exam Location:  [facility] Procedure: 2D Echo, Cardiac Doppler and Color Doppler Indications:     stroke 434.91  History:         Patient has prior history of Echocardiogram examinations. Drug                  overdose                  Anxiety.  Sonographer:     Alyse Low Roar Referring Phys:  [place] Diagnosing Phys: Nelva Bush MD IMPRESSIONS  1. Left ventricular ejection fraction,  by visual estimation, is 50 to 55%. The left ventricle has low normal function. There is no left ventricular hypertrophy.  2. The left ventricle has no regional wall motion abnormalities.  3. Global right ventricle has low normal systolic function.The right ventricular size is normal. No increase in right ventricular wall thickness.  4. Left atrial size was normal.  5. Right atrial size was normal.  6. The mitral valve is normal in structure. No evidence of mitral valve regurgitation. No evidence of mitral stenosis.  7. The tricuspid valve is normal in structure.  8. The aortic valve is normal in structure. Aortic valve regurgitation is not visualized. No evidence of aortic valve sclerosis or stenosis.  9. The pulmonic valve was not well visualized. Pulmonic valve regurgitation is not visualized. 10. Normal pulmonary artery systolic pressure. 11. The inferior vena cava is dilated in size with <50% respiratory variability, suggesting right atrial pressure of 15 mmHg. 12. The interatrial septum was not well visualized. FINDINGS  Left Ventricle: Left ventricular ejection fraction, by visual estimation, is 50 to 55%. The left ventricle has low normal function. The left ventricle has no regional wall motion abnormalities. The left ventricular internal cavity size was the left ventricle is normal in size. There is no left ventricular hypertrophy. Left ventricular diastolic parameters were normal. Right Ventricle: The right ventricular size is normal. No increase in right ventricular wall thickness. Global RV systolic function is has low normal systolic function. The tricuspid regurgitant velocity is 1.99 m/s, and with an assumed right atrial pressure of 15 mmHg, the estimated right ventricular systolic pressure is normal at 30.8 mmHg. Left Atrium: Left atrial size was normal in size. Right Atrium: Right atrial size was normal in size Pericardium: There is no evidence of pericardial effusion. Mitral Valve: The mitral valve  is normal in structure. No evidence of mitral valve regurgitation. No evidence of mitral valve stenosis by observation. Tricuspid Valve: The tricuspid valve is normal in structure. Tricuspid valve regurgitation is mild. Aortic Valve: The aortic valve is normal in structure. Aortic valve regurgitation is not visualized. The aortic valve is structurally normal, with no evidence of sclerosis or stenosis. Aortic valve mean gradient measures 4.0 mmHg. Aortic valve peak gradient measures 6.8 mmHg. Aortic valve area, by VTI measures 1.83 cm. Pulmonic Valve: The pulmonic valve was not well visualized. Pulmonic valve regurgitation is not visualized. Pulmonic regurgitation is not visualized. No evidence of pulmonic stenosis. Aorta: The aortic root is normal in size and structure. Pulmonary Artery: The pulmonary artery is not well seen. Venous: The inferior vena cava is dilated in size with less than 50% respiratory variability, suggesting right atrial pressure of 15 mmHg. IAS/Shunts: The interatrial septum was not well visualized. Agitated saline contrast was given intravenously to evaluate  for intracardiac shunting. Saline contrast bubble study was negative, with no evidence of any interatrial shunt.  LEFT VENTRICLE PLAX 2D LVIDd:         4.45 cm  Diastology LVIDs:         3.27 cm  LV e' lateral:   19.30 cm/s LV PW:         0.87 cm  LV E/e' lateral: 3.9 LV IVS:        0.80 cm  LV e' medial:    14.40 cm/s LVOT diam:     1.90 cm  LV E/e' medial:  5.2 LV SV:         47 ml LV SV Index:   29.02 LVOT Area:     2.84 cm  RIGHT VENTRICLE RV S prime:     11.10 cm/s TAPSE (M-mode): 1.5 cm LEFT ATRIUM             Index       RIGHT ATRIUM           Index LA diam:        3.20 cm 2.01 cm/m  RA Area:     10.10 cm LA Vol (A2C):   44.7 ml 28.07 ml/m RA Volume:   22.90 ml  14.38 ml/m LA Vol (A4C):   27.8 ml 17.46 ml/m LA Biplane Vol: 35.3 ml 22.17 ml/m  AORTIC VALVE                   PULMONIC VALVE AV Area (Vmax):    2.02 cm    PV  Vmax:       0.81 m/s AV Area (Vmean):   1.71 cm    PV Peak grad:  2.7 mmHg AV Area (VTI):     1.83 cm AV Vmax:           130.00 cm/s AV Vmean:          93.100 cm/s AV VTI:            0.238 m AV Peak Grad:      6.8 mmHg AV Mean Grad:      4.0 mmHg LVOT Vmax:         92.40 cm/s LVOT Vmean:        56.000 cm/s LVOT VTI:          0.154 m LVOT/AV VTI ratio: 0.65  AORTA Ao Root diam: 2.30 cm MITRAL VALVE                        TRICUSPID VALVE MV Area (PHT): 4.36 cm             TR Peak grad:   15.8 mmHg MV PHT:        50.46 msec           TR Vmax:        199.00 cm/s MV Decel Time: 174 msec MV E velocity: 75.20 cm/s 103 cm/s  SHUNTS MV A velocity: 44.10 cm/s 70.3 cm/s Systemic VTI:  0.15 m MV E/A ratio:  1.71       1.5       Systemic Diam: 1.90 cm  Yvonne Kendallhristopher End MD Electronically signed by Yvonne Kendallhristopher End MD Signature Date/Time: 02/08/2019/7:35:17 AM    Final (Updated)    CT Maxillofacial Wo Contrast  Result Date: 02/07/2019 CLINICAL DATA:  Found unresponsive. EXAM: CT MAXILLOFACIAL WITHOUT CONTRAST TECHNIQUE: Multidetector CT imaging of the maxillofacial structures was performed. Multiplanar CT image reconstructions  were also generated. COMPARISON:  None. FINDINGS: Osseous: No fracture or mandibular dislocation. No destructive process. Orbits: Negative. No traumatic or inflammatory finding. Sinuses: Mucosal thickening in the left frontal sinus and anterior left ethmoid air cells. No air-fluid levels. Mastoids clear. Soft tissues: Negative Limited intracranial: See head CT report. IMPRESSION: No acute bony abnormality in the face/orbits. Minimal chronic sinusitis changes. Electronically Signed   By: Charlett Nose M.D.   On: 02/07/2019 11:02    Labs:  Basic Metabolic Panel: Recent Labs  Lab 02/24/19 0508  NA 142  K 4.0  CL 113*  CO2 21*  GLUCOSE 94  BUN 29*  CREATININE 0.75  CALCIUM 9.2    CBC: No results for input(s): WBC, NEUTROABS, HGB, HCT, MCV, PLT in the last 168 hours.  CBG: No results  for input(s): GLUCAP in the last 168 hours.  Family history.  Father with anemia and hypertension.  Denies any diabetes mellitus or colon cancer  Brief HPI:   Jane Watkins is a 24 y.o. right-handed female with history of IV drug use, polysubstance as well as tobacco abuse, anxiety.  History taken from chart review.  Patient on no prescription medications at admission she lives with her boyfriend.  Reportedly works at [employer] and independent prior to admission.  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  She was discharged home with family ambulating extended distances with a rolling walker.  Presented 02/07/2019 after being found unresponsive.  EMS contacted patient received Narcan with some improvement in mental status.  Admission labs with WBC 20,700, potassium 5.2, BUN 25, creatinine 0.67, urine drug screen positive cocaine, marijuana and benzodiazepines, alcohol level less than 10, SARS coronavirus negative.  Cranial CT scan showed areas of indistinct low-density in the right basal ganglia with mass-effect on the right lateral ventricle.  Patient did not receive TPA.  MRI showed acute infarction of right MCA territory, affecting the basal ganglia and affecting the cortical and subcortical brain in a largely watershed distribution.  CTA of head and neck showed no major arterial occlusion or significant stenosis.  Echocardiogram with ejection fraction of 60%.  Neurology follow-up maintained on aspirin and Plavix for CVA prophylaxis x3 weeks then aspirin alone with Plavix to be completed 03/02/2019.  Subcutaneous Lovenox for DVT prophylaxis.  Psychiatry consulted for depression maintained on Seroquel as well as Prozac.  Patient was admitted for a comprehensive rehab program   Hospital Course: Celena Lanius was admitted to rehab 02/16/2019 for inpatient therapies to consist of PT, ST and OT at  least three hours five days a week. Past admission physiatrist, therapy team and rehab RN have worked together to provide customized collaborative inpatient rehab.  Pertaining to patient's right MCA infarction as well as history of hypoxic encephalopathy after drug overdose 2018 she continued to progress in therapies maintain on aspirin and Plavix through 03/02/2019 and then aspirin alone.  Subcutaneous Lovenox for DVT prophylaxis no bleeding episodes a repeat CT of the head was completed 02/23/2019 after she bumped her head on the bedside rail that showed continued interval evolution of subacute ischemic infarcts no frank hemorrhagic transformation no other acute abnormalities.  Pain management noted headaches as well as spasticity maintained on Topamax titrated as needed Zanaflex 2 mg twice daily as well as the addition of a Lidoderm patch.  Mood stabilization Seroquel 75 mg nightly for sleep Prozac increased to 40 mg daily she did receive follow-up per neuropsychology.  Long history of polysubstance tobacco abuse patient did receive counseling in regards to cessation of illicit drug use it was quite questionable she would be compliant with these request as well as discussing with Neuropsychology..  Hyperlipidemia she continued on Lipitor.   Blood pressures were monitored on TID basis and controlled  She is continent of bowel and bladder.  She has made gains during rehab stay and is attending therapies  She will continue to receive follow up therapies   after discharge  Rehab course: During patient's stay in rehab weekly team conferences were held to monitor patient's progress, set goals and discuss barriers to discharge. At admission, patient required minimal assist ambulate 130 feet hemiwalker, minimal assist sit to stand, minimal assist sit to supine.  Max assist upper body bathing total assist lower body bathing total assist upper body dressing total assist lower body dressing minimal assist toilet  transfers  Physical exam.  Blood pressure 106/64 pulse 71 temperature 98 respirations 18 oxygen saturation 99% room air Constitutional.  Well-developed HEENT Head.  Normocephalic and atraumatic Eyes.  Pupils round and reactive to light no discharge without nystagmus Neck.  Supple nontender no tracheal deviation no thyromegaly Cardiac regular rate rhythm without any extra sounds or murmur heard Respiratory.  Effort normal no respiratory distress without wheezes GI.  Soft nontender positive bowel sounds without rebound Musculoskeletal no edema or tenderness in extremities Neurological/musculoskeletal.  Motor strength right upper extremity right lower extremity 5 out of 5 proximal to distal left upper extremity 0 out of 5 proximal to distal left lower extremity hip flexors knee extension 2 out of 5 ankle dorsiflexion 0 out of 5.  Emerging tone elbow flexors left upper extremity  She  has had improvement in activity tolerance, balance, postural control as well as ability to compensate for deficits. She has had improvement in functional use RUE/LUE  and RLE/LLE as well as improvement in awareness.  Working with energy conservation techniques.  Gait training with AFO and rolling walker over level surfaces x100 feet increasing to 250 feet.  Contact-guard assist progressing to supervision assist.  Cues for improved step height hip flexion.  Dynamic gait training to wean through 8 cones x3 with supervision.  Patient also instructed in stepping over 1 inch sticks rolling walker minimal assist.  She could gather her belongings for activities of daily living and homemaking.  Full family teaching was completed and plan discharge to home       Disposition: Discharge to home    Diet: Regular  Special Instructions: No driving smoking or alcohol or illicit drug use  Medications at discharge 1.  Tylenol as needed 2.  Aspirin 81 mg p.o. daily 3.  Lipitor 80 mg p.o. daily 4.  Plavix 75 mg daily until  03/02/2019 and stop 5.  Prozac 40 mg p.o. daily 6.  Lidoderm patch as directed 7.  Multivitamin daily 8.  NicoDerm patch taper as directed 9.  Protonix 40 mg p.o. daily 10.  Seroquel 75 mg p.o. nightly 11.  Topamax 50 mg p.o. twice daily 12.  Zanaflex 2 mg p.o. every 12 hours  Discharge Instructions    Ambulatory referral to Neurology   Complete by: As directed    An appointment is requested in approximately 4 weeks right MCA infarction      Follow-up Information    Ranelle Oyster, MD Follow up.   Specialty: Physical Medicine and Rehabilitation Why: Office to call for appointment Contact information: [address] [place]  [place] [address] [phone]           Signed: Mcarthur Rossetti Aahil Fredin 02/28/2019, 5:20 AM

## 2019-02-28 ENCOUNTER — Encounter (HOSPITAL_COMMUNITY): Payer: Medicaid Other | Admitting: Psychology

## 2019-02-28 ENCOUNTER — Inpatient Hospital Stay (HOSPITAL_COMMUNITY): Payer: Medicaid Other | Admitting: Physical Therapy

## 2019-02-28 ENCOUNTER — Inpatient Hospital Stay (HOSPITAL_COMMUNITY): Payer: Medicaid Other | Admitting: Occupational Therapy

## 2019-02-28 MED ORDER — QUETIAPINE FUMARATE 25 MG PO TABS: 75.0000 mg | ORAL_TABLET | Freq: Every day | ORAL | 0 refills | Status: DC

## 2019-02-28 MED ORDER — PANTOPRAZOLE SODIUM 40 MG PO TBEC: 40.0000 mg | DELAYED_RELEASE_TABLET | Freq: Every day | ORAL | 0 refills | Status: DC

## 2019-02-28 MED ORDER — TIZANIDINE HCL 2 MG PO TABS: 2.0000 mg | ORAL_TABLET | Freq: Two times a day (BID) | ORAL | 0 refills | Status: DC

## 2019-02-28 MED ORDER — FLUOXETINE HCL 40 MG PO CAPS: 40.0000 mg | ORAL_CAPSULE | Freq: Every day | ORAL | 3 refills | Status: DC

## 2019-02-28 MED ORDER — NICOTINE 21 MG/24HR TD PT24: MEDICATED_PATCH | TRANSDERMAL | 0 refills | Status: DC

## 2019-02-28 MED ORDER — ATORVASTATIN CALCIUM 80 MG PO TABS: 80.0000 mg | ORAL_TABLET | Freq: Every day | ORAL | 0 refills | Status: DC

## 2019-02-28 MED ORDER — CLOPIDOGREL BISULFATE 75 MG PO TABS: 75.0000 mg | ORAL_TABLET | Freq: Every day | ORAL | 0 refills | Status: DC

## 2019-02-28 MED ORDER — TOPIRAMATE 50 MG PO TABS: 50.0000 mg | ORAL_TABLET | Freq: Two times a day (BID) | ORAL | 0 refills | Status: DC

## 2019-02-28 MED ORDER — LIDOCAINE 5 % EX PTCH: 1.0000 | MEDICATED_PATCH | CUTANEOUS | 0 refills | Status: DC

## 2019-02-28 MED ORDER — ACETAMINOPHEN 325 MG PO TABS: 650.0000 mg | ORAL_TABLET | ORAL | Status: DC | PRN

## 2019-02-28 NOTE — Progress Notes (Signed)
[facility] PHYSICAL MEDICINE & REHABILITATION PROGRESS NOTE   Subjective/Complaints: Up with therapy. Happy but nervous about discharge. Has a list of questions.   ROS: Patient denies fever, rash, sore throat, blurred vision, nausea, vomiting, diarrhea, cough, shortness of breath or chest pain, joint or back pain, headache .    Objective:   No results found. No results for input(s): WBC, HGB, HCT, PLT in the last 72 hours. No results for input(s): NA, K, CL, CO2, GLUCOSE, BUN, CREATININE, CALCIUM in the last 72 hours.  Intake/Output Summary (Last 24 hours) at 02/28/2019 1201 Last data filed at 02/28/2019 0800 Gross per 24 hour  Intake 1300 ml  Output --  Net 1300 ml     Physical Exam: Vital Signs Blood pressure 106/63, pulse 70, temperature 97.9 F (36.6 C), resp. rate 16, height 5\' 5"  (1.651 m), last menstrual period 01/31/2019, SpO2 100 %. Constitutional: No distress . Vital signs reviewed. HEENT: EOMI, oral membranes moist Neck: supple Cardiovascular: RRR without murmur. No JVD    Respiratory: CTA Bilaterally without wheezes or rales. Normal effort    GI: BS +, non-tender, non-distended  Skin: Warm and dry.  Intact. Psych: Flat. Musc: No edema in extremities.  No tenderness in extremities. Neurological: Alert Motor:  LUE: tr to 1/5 deltoid, pecs, 0/5 distal LLE: HF, KE 2/5, 0/5 ADF  MAS: tr 1+ LLE gastroc/trace hamstrings, and LUE elbow flexors  Assessment/Plan: 1. Functional deficits secondary to right MCA infarction which require 3+ hours per day of interdisciplinary therapy in a comprehensive inpatient rehab setting.  Physiatrist is providing close team supervision and 24 hour management of active medical problems listed below.  Physiatrist and rehab team continue to assess barriers to discharge/monitor patient progress toward functional and medical goals  Care Tool:  Bathing    Body parts bathed by patient: Left arm, Chest, Abdomen, Front perineal area,  Buttocks, Right upper leg, Left upper leg, Face, Right arm, Right lower leg, Left lower leg   Body parts bathed by helper: Left lower leg, Right lower leg, Right arm     Bathing assist Assist Level: Supervision/Verbal cueing     Upper Body Dressing/Undressing Upper body dressing   What is the patient wearing?: Pull over shirt    Upper body assist Assist Level: Supervision/Verbal cueing    Lower Body Dressing/Undressing Lower body dressing      What is the patient wearing?: Pants     Lower body assist Assist for lower body dressing: Supervision/Verbal cueing     Toileting Toileting Toileting Activity did not occur (Clothing management and hygiene only): N/A (no void or bm)  Toileting assist Assist for toileting: Supervision/Verbal cueing     Transfers Chair/bed transfer  Transfers assist     Chair/bed transfer assist level: Supervision/Verbal cueing     Locomotion Ambulation   Ambulation assist      Assist level: Supervision/Verbal cueing Assistive device: Walker-rolling Max distance: 150   Walk 10 feet activity   Assist     Assist level: Supervision/Verbal cueing Assistive device: Walker-rolling, Orthosis   Walk 50 feet activity   Assist    Assist level: Supervision/Verbal cueing Assistive device: Orthosis, Walker-rolling    Walk 150 feet activity   Assist Walk 150 feet activity did not occur: Safety/medical concerns  Assist level: Supervision/Verbal cueing Assistive device: Walker-rolling, Orthosis    Walk 10 feet on uneven surface  activity   Assist     Assist level: Contact Guard/Touching assist Assistive device: 02/02/2019  Assist Will patient use wheelchair at discharge?: No             Wheelchair 50 feet with 2 turns activity    Assist            Wheelchair 150 feet activity     Assist          Blood pressure 106/63, pulse 70, temperature 97.9 F (36.6 C), resp. rate 16,  height 5\' 5"  (1.651 m), last menstrual period 01/31/2019, SpO2 100 %.  Medical Problem List and Plan: 1.  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  2.  Antithrombotics: -DVT/anticoagulation: Subcutaneous Lovenox             -antiplatelet therapy: Aspirin 81 mg daily and Plavix 75 mg daily until 03/02/2019 and then aspirin alone 3. Pain Management: Tylenol as needed  1/10 Increased Topamax to 50 mg twice daily---improved  1/8 -spastic left hemiparesis---ROM/splinting   Consider medications/injections as outpatient if necessary, stable at present  1/11: tizanidine increased to 2mg  bid  Pain controlled at time of discharge 4. Mood:   1/12 -seroquel to 75mg  qhs for better sleep   -increased prozac to 40mg  for depression   -appreciate Dr. Ferne Coe input.   1/18 -I have talked with patient re: her mood/substance abuse/stroke recovery etc. She has the will to improve and rehab through this. Expressed to her that team is here for her support.    -antipsychotic agents: seroquel 5. Neuropsych: This patient is capable of making decisions on her own behalf. 6. Skin/Wound Care: Routine skin checks 7. Fluids/Electrolytes/Nutrition: encourage PO  BUN elevated on 1/14 8.  Polysubstance abuse as well as tobacco abuse.  Continues to counsel   -will need intensive outpt follow up given history (see above) 9.  Hyperlipidemia.  Continue Lipitor    LOS: 12 aspirin alone okay okay thank you: [place] EVALUATION WAS PERFORMED  Meredith Staggers 02/28/2019, 12:01 PM

## 2019-02-28 NOTE — Progress Notes (Signed)
Social Work Discharge Note   The overall goal for the admission was met for:   Discharge location: Yes - home with boyfriend as primary caregiver  Length of Stay: Yes - 12 days  Discharge activity level: Yes - supervision overall  Home/community participation: Yes  Services provided included: MD, RD, PT, OT, SLP, RN, TR, Pharmacy, Neuropsych and SW  Financial Services: Private Insurance: [facility]  Follow-up services arranged: Outpatient: PT, OT via [facility], DME: 16x16 lightweigh/ hemi wheelchair, cushion, rolling walker, 1/2 lap tray for w/c all via [facility] and Patient/Family has no preference for HH/DME agencies  Comments (or additional information): Pt @ [phone] or boyfriend, [name] @ [phone]  Patient/Family verbalized understanding of follow-up arrangements: Yes  Individual responsible for coordination of the follow-up plan: pt  Also, provided pt with list of "in network" behavioral health providers per her request.  Confirmed correct DME delivered: Lennart Pall 02/28/2019    Salaya Holtrop

## 2019-02-28 NOTE — Progress Notes (Signed)
Physical Therapy Discharge Summary  Patient Details  Name: Jane Watkins MRN: [id number] Date of Birth: 1995-12-21  Today's Date: 02/28/2019 PT Individual Time: 1007-1101 PT Individual Time Calculation (min): 54 min    Patient has met 8 of 8 long term goals due to improved activity tolerance, improved balance, improved postural control, increased strength, ability to compensate for deficits, functional use of  left lower extremity, improved attention, improved awareness and improved coordination.  Patient to discharge at an ambulatory level close supervision with RW, hand orthosis, AFO.   Patient's care partner is independent to provide the necessary physical and cognitive assistance at discharge.  Reasons goals not met: n/a  Recommendation:  Patient will benefit from ongoing skilled PT services in outpatient setting to continue to advance safe functional mobility, address ongoing impairments in L NMR, balance, awareness, coordination, progress gait with LRAD, and minimize fall risk.  Equipment: w/c with half lap tray, RW, AFO, hand orthosis  Reasons for discharge: treatment goals met and discharge from hospital  Patient/family agrees with progress made and goals achieved: Yes  Skilled PT Treatment: Pt received in room with boyfriend [name]) present. No c/o pain reported. Manual testing completed, see below. Pt already with B shoes & L AFO donned & pt ambulates bed>w/c with RW & supervision. Transported pt to apartment via w/c dependent assist for time management. Gait training through obstacles in household environment with therapist providing education/demo on need for side stepping with RW when in small spaces with pt requiring min assist to manage RW when side stepping to L but close supervision when side stepping to R. Pt transfers w/c>mat table without AD & supervision. While standing EOM without UE support pt engaged in reaching to L to obtain then toss horseshoes with task focusing on  weight shifting to L for LLE NMR & weight bearing, and standing balance. Pt then performs sit<>stand from EOM with RLE elevated on 3" step with focus on more anterior/L weight shifting for LLE strengthening & NMR. Pt then reports need to use restroom so pt returned to room. Pt ambulates in/out of bathroom with RW & CGA, performs toilet transfer with supervision, clothing management with supervision, and has continent void on toilet. Pt then ambulates around nurses station with RW & supervision. At end of session pt left sitting on EOB with family present.   Pain: pt reports "a little in my L shoulder" but states this is not new & reports receiving tylenol recently.  PT Discharge Precautions/Restrictions Precautions Precautions: Fall Precaution Comments: L hemi Restrictions Weight Bearing Restrictions: No  Vision/Perception  Pt wears glasses for driving only, no changes in baseline vision. Perception Perception: Impaired Inattention/Neglect: (decreased attention to L side of body & environment)   Cognition Overall Cognitive Status: Within Functional Limits for tasks assessed Arousal/Alertness: Awake/alert Orientation Level: Oriented X4 Memory: Appears intact Awareness: Impaired Awareness Impairment: Anticipatory impairment Problem Solving: Appears intact Safety/Judgment: Appears intact Comments: flat affect   Sensation Sensation Light Touch: Not tested Proprioception: Impaired by gross assessment Coordination Gross Motor Movements are Fluid and Coordinated: No Fine Motor Movements are Fluid and Coordinated: No Coordination and Movement Description: L hemiplegia (UE>LE)  Motor  Motor Motor: Hemiplegia;Abnormal tone;Abnormal postural alignment and control Motor - Discharge Observations: L hemiplegia, slight extensor tone noted in LLE   Mobility Bed Mobility Bed Mobility: Rolling Right;Rolling Left;Supine to Sit;Sit to Supine Rolling Right: Independent with assistive  device Rolling Left: Independent with assistive device Supine to Sit: Independent with assistive device Sit to Supine: Independent  with assistive device Transfers Transfers: Stand to Sit;Sit to Stand;Stand Pivot Transfers Sit to Stand: Supervision/Verbal cueing Stand to Sit: Supervision/Verbal cueing Stand Pivot Transfers: Supervision/Verbal cueing   Locomotion  Gait Ambulation: Yes Gait Assistance: Supervision/Verbal cueing Gait Distance (Feet): 150 Feet Assistive device: Rolling walker(L hand orthosis, L AFO & toe cap) Gait Assistance Details: Verbal cues for precautions/safety Gait Assistance Details: cuing for attention to L side Gait Gait: Yes Gait Pattern: Impaired Gait Pattern: Decreased step length - left;Decreased stance time - left;Decreased stride length;Decreased hip/knee flexion - left;Decreased dorsiflexion - left;Decreased weight shift to left Gait velocity: decreased Stairs / Additional Locomotion Stairs: Yes Stairs Assistance: Minimal Assistance - Patient > 75% Stair Management Technique: (1 rail) Number of Stairs: 12 Ramp: Supervision/Verbal cueing(with RW) Wheelchair Mobility Wheelchair Mobility: No   Trunk/Postural Assessment  Cervical Assessment Cervical Assessment: Within Functional Limits Thoracic Assessment Thoracic Assessment: Within Functional Limits Lumbar Assessment Lumbar Assessment: Within Functional Limits Postural Control Postural Control: Deficits on evaluation Righting Reactions: delayed Protective Responses: delayed   Balance Balance Balance Assessed: Yes Static Sitting Balance Static Sitting - Level of Assistance: 7: Independent Dynamic Sitting Balance Dynamic Sitting - Level of Assistance: 5: Stand by assistance Static Standing Balance Static Standing - Level of Assistance: 5: Stand by assistance   Extremity Assessment  Per OT Assessment: RUE Assessment RUE Assessment: Within Functional Limits LUE Assessment LUE  Assessment: Exceptions to [facility] LUE Body System: Neuro;Ortho Brunstrum levels for arm and hand: Arm;Hand Brunstrum level for arm: Stage I Presynergy Brunstrum level for hand: Stage I Flaccidity LUE Tone LUE Tone: Flaccid;Modified Ashworth Modified Ashworth Scale for Grading Hypertonia LUE: Slight increase in muscle tone, manifested by a catch and release or by minimal resistance at the end of the range of motion when the affected part(s) is moved in flexion or extension   RLE Assessment Passive Range of Motion (PROM) Comments: [facility] General Strength Comments: grossly 4/5 hip, knee & dorsiflexors all tested in sitting  LLE Assessment LLE Assessment: Exceptions to [facility] Passive Range of Motion (PROM) Comments: [facility] General Strength Comments: 0/5 ankle dorsiflexion, 3-/5 knee extension, 3/5 hip flexion all tested in sitting    Waunita Schooner 02/28/2019, 12:31 PM

## 2019-02-28 NOTE — Progress Notes (Signed)
Occupational Therapy Discharge Summary  Patient Details  Name: Jane Watkins MRN: [id number] Date of Birth: 1995/10/01  Today's Date: 02/28/2019 OT Individual Time: 7939-0300 OT Individual Time Calculation (min): 70 min   Pt greeted semi-reclined in bed asleep, easy to wake, and agreeable to OT treatment session. Pt reported being excited for dc tomorrow. Pt came to sitting EOB from flat surface with supervision. Worked on donning shoes and L AFO, with pt still needing min A to don L AFO. Pt then ambulated into bathroom using quad cane and close supervision. Pt managed clothing, and had successful BM and voided bladder. Pt completed peri-care with supervision. Toothbrushing task completed from wc at the sink mod I. Pt brought down to therapy gym for L UE NMR. OT brought pt into gravity eliminated sidelying position and placed L UE in UE ranger. Pt with slight flexor synergy activation. Pt returned to room at end of session and left seated in wc with alarm belt on, call bell in reach and needs.  Patient has met 11 of 11 long term goals due to improved activity tolerance, improved balance, postural control, ability to compensate for deficits and functional use of  LEFT upper and LEFT lower extremity.  Patient to discharge at overall Supervision level.  Patient's care partner is independent to provide the necessary physical assistance at discharge.    Reasons goals not met: n/a  Recommendation:  Patient will benefit from ongoing skilled OT services in outpatient setting to continue to advance functional skills in the area of BADL and functional use of L UE.  Equipment: tub transfer bench (pt to purchase privately) wc with 1/2 lap tray, RW  Reasons for discharge: treatment goals met and discharge from hospital  Patient/family agrees with progress made and goals achieved: Yes  OT Discharge Precautions/Restrictions  Precautions Precautions: Fall Precaution Comments: L hemi Restrictions Weight  Bearing Restrictions: No Pain Pain Assessment Pain Scale: 0-10 Pain Score: 1  Pain Type: Acute pain Pain Location: Head Pain Descriptors / Indicators: Headache ADL ADL Eating: Modified independent Grooming: Modified independent Where Assessed-Grooming: Sitting at sink Upper Body Bathing: Supervision/safety Where Assessed-Upper Body Bathing: Shower Lower Body Bathing: Supervision/safety Where Assessed-Lower Body Bathing: Shower Upper Body Dressing: Supervision/safety Where Assessed-Upper Body Dressing: Wheelchair Lower Body Dressing: Contact guard Where Assessed-Lower Body Dressing: Wheelchair Toileting: Copy: Close supervision Toilet Transfer Method: Arts development officer: Energy manager: Close supervison Social research officer, government: Curator Method: Radiographer, therapeutic: Grab bars, Shower seat with back Perception  Perception: Impaired Inattention/Neglect: (decreased attention to L side of body & environment) Cognition Overall Cognitive Status: Within Functional Limits for tasks assessed Arousal/Alertness: Awake/alert Orientation Level: Oriented X4 Memory: Appears intact Immediate Memory Recall: Sock;Blue;Bed Memory Recall Sock: Without Cue Memory Recall Blue: Without Cue Memory Recall Bed: With Cue Awareness: Impaired Awareness Impairment: Anticipatory impairment Problem Solving: Appears intact Safety/Judgment: Appears intact Comments: flat affect Sensation Sensation Light Touch: Not tested Proprioception: Impaired by gross assessment Coordination Gross Motor Movements are Fluid and Coordinated: No Fine Motor Movements are Fluid and Coordinated: No Coordination and Movement Description: L hemiplegia (UE>LE) Motor  Motor Motor: Hemiplegia;Abnormal tone;Abnormal postural alignment and control Motor - Discharge Observations: L hemiplegia, slight extensor tone  noted in LLE Mobility  Bed Mobility Bed Mobility: Rolling Right;Rolling Left;Supine to Sit;Sit to Supine Rolling Right: Independent with assistive device Rolling Left: Independent with assistive device Supine to Sit: Independent with assistive device Sit to Supine: Independent with assistive device  Transfers Sit to Stand: Supervision/Verbal cueing Stand to Sit: Supervision/Verbal cueing  Trunk/Postural Assessment  Cervical Assessment Cervical Assessment: Within Functional Limits Thoracic Assessment Thoracic Assessment: Within Functional Limits Lumbar Assessment Lumbar Assessment: Within Functional Limits Postural Control Postural Control: Deficits on evaluation Righting Reactions: delayed Protective Responses: delayed  Balance Balance Balance Assessed: Yes Static Sitting Balance Static Sitting - Level of Assistance: 7: Independent Dynamic Sitting Balance Dynamic Sitting - Level of Assistance: 5: Stand by assistance Static Standing Balance Static Standing - Level of Assistance: 5: Stand by assistance Dynamic Standing Balance Dynamic Standing - Level of Assistance: 5: Stand by assistance Extremity/Trunk Assessment RUE Assessment RUE Assessment: Within Functional Limits LUE Assessment LUE Assessment: Exceptions to [facility] LUE Body System: Neuro;Ortho Brunstrum levels for arm and hand: Arm;Hand Brunstrum level for arm: Stage II Synergy is developing Brunstrum level for hand: Stage II Synergy is developing LUE Tone LUE Tone: Flaccid;Modified Ashworth Modified Ashworth Scale for Grading Hypertonia LUE: Slight increase in muscle tone, manifested by a catch, followed by minimal resistance throughout the remainder (less than half) of the ROM  Valma Cava 02/28/2019, 12:50 PM

## 2019-02-28 NOTE — Plan of Care (Signed)
Pt discharging to home.

## 2019-02-28 NOTE — Discharge Instructions (Signed)
Inpatient Rehab Discharge Instructions  Shaeley Segall Discharge date and time: No discharge date for patient encounter.   Activities/Precautions/ Functional Status: Activity: activity as tolerated Diet: regular diet Wound Care: none needed Functional status:  ___ No restrictions     ___ Walk up steps independently ___ 24/7 supervision/assistance   ___ Walk up steps with assistance ___ Intermittent supervision/assistance  ___ Bathe/dress independently ___ Walk with walker     _x__ Bathe/dress with assistance ___ Walk Independently    ___ Shower independently ___ Walk with assistance    ___ Shower with assistance ___ No alcohol     ___ Return to work/school ________     COMMUNITY REFERRALS UPON DISCHARGE:         Outpatient: PT     OT                  Agency:  [facility] Phone: [phone]               Appointment Date/Time:  1/20 @ 2:30 pm    Medical Equipment/Items Ordered:  Wheelchair, cushion, walker                                                      Agency/Supplier:  [facility] @ [phone]        Special Instructions:  No driving smoking or alcohol  Continue aspirin and Plavix with Plavix to be completed 03/02/2019 and stop STROKE/TIA DISCHARGE INSTRUCTIONS SMOKING Cigarette smoking nearly doubles your risk of having a stroke & is the single most alterable risk factor  If you smoke or have smoked in the last 12 months, you are advised to quit smoking for your health.  Most of the excess cardiovascular risk related to smoking disappears within a year of stopping.  Ask you doctor about anti-smoking medications  [place] Quit Line: 1-800-QUIT NOW  Free Smoking Cessation Classes [phone]  CHOLESTEROL Know your levels; limit fat & cholesterol in your diet  Lipid Panel     Component Value Date/Time   CHOL 87 02/08/2019 0705   TRIG 87 02/08/2019 0705   HDL 35 (L) 02/08/2019 0705   CHOLHDL 2.5 02/08/2019 0705   VLDL 17 02/08/2019 0705     LDLCALC 35 02/08/2019 0705      Many patients benefit from treatment even if their cholesterol is at goal.  Goal: Total Cholesterol (CHOL) less than 160  Goal:  Triglycerides (TRIG) less than 150  Goal:  HDL greater than 40  Goal:  LDL (LDLCALC) less than 100   BLOOD PRESSURE American Stroke Association blood pressure target is less that 120/80 mm/Hg  Your discharge blood pressure is:  BP: (!) 98/54  Monitor your blood pressure  Limit your salt and alcohol intake  Many individuals will require more than one medication for high blood pressure  DIABETES (A1c is a blood sugar average for last 3 months) Goal HGBA1c is under 7% (HBGA1c is blood sugar average for last 3 months)  Diabetes: No known diagnosis of diabetes    Lab Results  Component Value Date   HGBA1C 5.0 02/07/2019     Your HGBA1c can be lowered with medications, healthy diet, and exercise.  Check your blood sugar as directed by your physician  Call your physician if you experience unexplained or low blood sugars.  PHYSICAL  ACTIVITY/REHABILITATION Goal is 30 minutes at least 4 days per week  Activity: Increase activity slowly, Therapies: Physical Therapy: Home Health Return to work:   Activity decreases your risk of heart attack and stroke and makes your heart stronger.  It helps control your weight and blood pressure; helps you relax and can improve your mood.  Participate in a regular exercise program.  Talk with your doctor about the best form of exercise for you (dancing, walking, swimming, cycling).  DIET/WEIGHT Goal is to maintain a healthy weight  Your discharge diet is:  Diet Order            Diet regular Room service appropriate? Yes; Fluid consistency: Thin  Diet effective now              liquids Your height is:    Your current weight is:   Your Body Mass Index (BMI) is:     Following the type of diet specifically designed for you will help prevent another stroke.  Your goal weight  range is:    Your goal Body Mass Index (BMI) is 19-24.  Healthy food habits can help reduce 3 risk factors for stroke:  High cholesterol, hypertension, and excess weight.  RESOURCES Stroke/Support Group:  Call [phone]   STROKE EDUCATION PROVIDED/REVIEWED AND GIVEN TO PATIENT Stroke warning signs and symptoms How to activate emergency medical system (call 911). Medications prescribed at discharge. Need for follow-up after discharge. Personal risk factors for stroke. Pneumonia vaccine given:  Flu vaccine given:  My questions have been answered, the writing is legible, and I understand these instructions.  I will adhere to these goals & educational materials that have been provided to me after my discharge from the hospital.       My questions have been answered and I understand these instructions. I will adhere to these goals and the provided educational materials after my discharge from the hospital.  Patient/Caregiver Signature _______________________________ Date __________  Clinician Signature _______________________________________ Date __________  Please bring this form and your medication list with you to all your follow-up doctor's appointments.

## 2019-02-28 NOTE — Progress Notes (Signed)
Discharge instructions provided to pt and family by D. Anguilli, PA-c. Verbalized an understanding. Pt discharged with personal property and equipment.

## 2019-02-28 NOTE — Plan of Care (Signed)
  Problem: RH Balance Goal: LTG Patient will maintain dynamic standing with ADLs (OT) Description: LTG:  Patient will maintain dynamic standing balance with assist during activities of daily living (OT)  Outcome: Completed/Met   Problem: Sit to Stand Goal: LTG:  Patient will perform sit to stand in prep for activites of daily living with assistance level (OT) Description: LTG:  Patient will perform sit to stand in prep for activites of daily living with assistance level (OT) Outcome: Completed/Met   Problem: RH Eating Goal: LTG Patient will perform eating w/assist, cues/equip (OT) Description: LTG: Patient will perform eating with assist, with/without cues using equipment (OT) Outcome: Completed/Met   Problem: RH Grooming Goal: LTG Patient will perform grooming w/assist,cues/equip (OT) Description: LTG: Patient will perform grooming with assist, with/without cues using equipment (OT) Outcome: Completed/Met   Problem: RH Bathing Goal: LTG Patient will bathe all body parts with assist levels (OT) Description: LTG: Patient will bathe all body parts with assist levels (OT) Outcome: Completed/Met   Problem: RH Dressing Goal: LTG Patient will perform upper body dressing (OT) Description: LTG Patient will perform upper body dressing with assist, with/without cues (OT). Outcome: Completed/Met Goal: LTG Patient will perform lower body dressing w/assist (OT) Description: LTG: Patient will perform lower body dressing with assist, with/without cues in positioning using equipment (OT) Outcome: Completed/Met   Problem: RH Toileting Goal: LTG Patient will perform toileting task (3/3 steps) with assistance level (OT) Description: LTG: Patient will perform toileting task (3/3 steps) with assistance level (OT)  Outcome: Completed/Met   Problem: RH Functional Use of Upper Extremity Goal: LTG Patient will use RT/LT upper extremity as a (OT) Description: LTG: Patient will use right/left upper  extremity as a stabilizer/gross assist/diminished/nondominant/dominant level with assist, with/without cues during functional activity (OT) Outcome: Completed/Met   Problem: RH Toilet Transfers Goal: LTG Patient will perform toilet transfers w/assist (OT) Description: LTG: Patient will perform toilet transfers with assist, with/without cues using equipment (OT) Outcome: Completed/Met   Problem: RH Tub/Shower Transfers Goal: LTG Patient will perform tub/shower transfers w/assist (OT) Description: LTG: Patient will perform tub/shower transfers with assist, with/without cues using equipment (OT) Outcome: Completed/Met

## 2019-03-01 ENCOUNTER — Telehealth: Payer: Self-pay

## 2019-03-01 NOTE — Telephone Encounter (Signed)
Error

## 2019-03-01 NOTE — Telephone Encounter (Signed)
Transitional Care call-patient    1. Are you/is patient experiencing any problems since coming home? Nose bleeds but did get them to stop. Advised patient to call 911 if ever bleeds and can't get it to stop Are there any questions regarding any aspect of care? No 2. Are there any questions regarding medications administration/dosing? No Are meds being taken as prescribed? Yes Patient should review meds with caller to confirm 3. Have there been any falls? No 4. Has Home Health been to the house and/or have they contacted you? Starting O[facility] therapy on Wednesday If not, have you tried to contact them? Can we help you contact them? 5. Are bowels and bladder emptying properly? Yes Are there any unexpected incontinence issues? No If applicable, is patient following bowel/bladder programs? 6. Any fevers, problems with breathing, unexpected pain? No 7. Are there any skin problems or new areas of breakdown? No 8. Has the patient/family member arranged specialty MD follow up (ie cardiology/neurology/renal/surgical/etc)? No other follows up on discharge summary Can we help arrange? 9. Does the patient need any other services or support that we can help arrange? No 10. Are caregivers following through as expected in assisting the patient? Yes 11. Has the patient quit smoking, drinking alcohol, or using drugs as recommended? Yes  Appointment time 1:00 pm , arrive time 12:45 pm with Dr. Carlis Abbott on 03/11/1999 [address]

## 2019-03-02 ENCOUNTER — Other Ambulatory Visit: Payer: Self-pay

## 2019-03-02 ENCOUNTER — Ambulatory Visit: Payer: 59 | Attending: Physical Medicine & Rehabilitation

## 2019-03-02 VITALS — BP 98/58 | HR 77

## 2019-03-02 DIAGNOSIS — M25512 Pain in left shoulder: Secondary | ICD-10-CM | POA: Insufficient documentation

## 2019-03-02 DIAGNOSIS — I63511 Cerebral infarction due to unspecified occlusion or stenosis of right middle cerebral artery: Secondary | ICD-10-CM | POA: Insufficient documentation

## 2019-03-02 DIAGNOSIS — M6281 Muscle weakness (generalized): Secondary | ICD-10-CM

## 2019-03-02 DIAGNOSIS — I639 Cerebral infarction, unspecified: Secondary | ICD-10-CM | POA: Diagnosis present

## 2019-03-02 DIAGNOSIS — R2689 Other abnormalities of gait and mobility: Secondary | ICD-10-CM

## 2019-03-02 DIAGNOSIS — R41844 Frontal lobe and executive function deficit: Secondary | ICD-10-CM | POA: Diagnosis present

## 2019-03-02 NOTE — Therapy (Signed)
[place] MAIN [facility] SERVICES [address] [place], [place], [address] Phone: [phone]   Fax:  [phone]  Physical Therapy Treatment  Patient Details  Name: Jane Watkins MRN: [id number] Date of Birth: Jun 12, 1995 Referring Provider (PT): Dr. Earnest Conroy. [name]   Encounter Date: 03/02/2019  PT End of Session - 03/02/19 1541    Visit Number  1    Number of Visits  24    Date for PT Re-Evaluation  05/25/19    Authorization - Visit Number  1    PT Start Time  5427    PT Stop Time  1540    PT Time Calculation (min)  58 min    Equipment Utilized During Treatment  Gait belt    Activity Tolerance  Patient tolerated treatment well    Behavior During Therapy  Flat affect       Past Medical History:  Diagnosis Date  . AKI (acute kidney injury) ([place]) 2018   "from overdose"  . Anxiety   . Daily headache   . Depression   . Drug overdose 04/2016   [name] 05/01/2016  . GERD (gastroesophageal reflux disease)    "when I was younger; gone now" (09/21/2017)  . Migraine    "q couple weeks" (09/21/2017)  . Overdose     Past Surgical History:  Procedure Laterality Date  . APPENDECTOMY  04/2013  . I & D EXTREMITY Left 09/20/2017   Procedure: IRRIGATION AND DEBRIDEMENT LEFT ARM;  Surgeon: [name], MD;  Location: [facility];  Service: Orthopedics;  Laterality: Left;  . TEE WITHOUT CARDIOVERSION N/A 02/10/2019   Procedure: TRANSESOPHAGEAL ECHOCARDIOGRAM (TEE);  Surgeon: [name], MD;  Location: [facility];  Service: Cardiovascular;  Laterality: N/A;  Polysubstance abuser  . TONGUE SURGERY  ~ 2007   "related to lisp"  . TRACHEOSTOMY  04/2016   [name] 05/01/2016    Vitals:   03/02/19 1501  BP: (!) 98/58  Pulse: 77  SpO2: 99%    Subjective Assessment - 03/02/19 1445    Subjective  Patient is motivated to regain as much independence as possible with therapy, has ongoing complaints of L shoulder pain.    Patient is accompained by:  --    significant other   Pertinent History  Pt is 24 y.o. female presenting to hospital 02/07/19 initially,  MRI showing acute infarct R MCA territory affecting basal ganglia and affecting cortical and subcortical brain in a largely watershed distribution. After in hospital stay patient transitioned to CIR from 02/16/2019-03/01/2019.  PMH includes IV drug abuse, anxiety, depression, migraines, AKI, hypoxic ischemic encephalopathy 2018, opiate overdose 2018, h/o trach 2018, h/o I&D L UE. PLOF: lives with boyfriend in one story apartment, prior to CVA patient was independent, working.    Limitations  Walking;Reading;Lifting;Writing;House hold activities;Standing    How long can you sit comfortably?  NA    How long can you stand comfortably?  20 mins    How long can you walk comfortably?  20 mins    Diagnostic tests  see MRI results    Patient Stated Goals  Patient wants to maximize how much she can walk, get back to normal    Currently in Pain?  Yes    Pain Score  3     Pain Location  Shoulder    Pain Orientation  Left    Pain Descriptors / Indicators  Aching    Pain Type  Chronic pain    Pain Onset  1 to 4 weeks  ago    Pain Frequency  Intermittent    Aggravating Factors   moving L arm    Pain Relieving Factors  rest      OBJECTIVE  MUSCULOSKELETAL: Tremor: Absent Bulk: Normal Tone: Normal, no clonus  Posture Patient noted to have R lean in sitting after ambulation, often protective of LUE. Forward head, rounded shoulders  Gait Patient exhibited circumduction of LLE especially with fatigue, L AFO in place to avoid foot drop while ambulating, some tone response noted with weight bearing. Occasionally would hit L foot off of RW. Decreased hip extension on L and decreased knee flexion on L   Strength R/L 5/5 Hip flexion 4+/2- Hip external rotation 4+/1 Hip internal rotation 5/1 Hip abduction 5/1 Hip adduction 5/2+ Knee extension 5/2- Knee flexion 5/0 Ankle Plantarflexion 5/0 Ankle  Dorsiflexion 4/0 Ankle eversion 4/0 Ankle inversion   NEUROLOGICAL:  Mental Status Patient is oriented to person, place and time.  Recent memory is intact.  Remote memory is intact.  Attention span and concentration are intact.  Expressive speech is intact.  Patient's fund of knowledge is within normal limits for educational level.  Cranial Nerves Facial strength is intact bilaterally  Hearing is normal as tested by gross conversation Palate elevates midline, normal phonation, though patient does speech softly Often flat affect noted   Sensation Grossly intact to light touch bilateral UEs/LEs as determined by testing dermatomes C2-T2/L2-S2 respectively    Coordination/Cerebellar Unable to perform on LUE and LLE due to profound weakness   FUNCTIONAL OUTCOME MEASURES   Results Comments                  5TSTS 17seconds no UE, very little weight bearing in LLE   6 Minute Walk Test 51ft   10 Meter Gait Speed Self-selected: s =.57 m/s; Fastest: s = .72 m/s (performed with quad cane) Below normative values for full community ambulation              ASSESSMENT Clinical Impression: Pt is a 24 year-old female that presented to clinic after CVA with L hemiplegia/hemiparesis. Pt experienced a CVA after drug overdose in 01/2019, and attended CIR prior to outpatient PT evaluation. Upon assessment the patient exhibited deficits in LE and UE ROM and strength, as well as gait and balance abnormalities. These deficits limit the patient's ability to perform age appropriate functional activities/ADLs/recreational activities such as lifting, walking, squatting, stair navigation, work duties, Catering manager. The patient would benefit from further skilled PT intervention to address these limitations to maximize patient independence, mobility, and decrease risk of falls. .    Patient instructed in and performed HEP 1-2 reps to ensure carry over at end of session Sit to stands without UE  support Seated hip abduction/isometric with RTB Seated LAQ bilaterally    PT Education - 03/02/19 1447    Education provided  Yes    Education Details  POC, HEP    Person(s) Educated  Patient    Methods  Explanation    Comprehension  Verbalized understanding;Returned demonstration       PT Short Term Goals - 03/02/19 1542      PT SHORT TERM GOAL #1   Title  Pt will be independent with initial HEP in order to indicate improved strength and decreased fall risk.    Baseline  initial HEP administered on eval 03/02/2019    Time  6    Period  Weeks    Status  New    Target Date  04/13/19        PT Long Term Goals - 03/02/19 1543      PT LONG TERM GOAL #1   Title  Pt will be independent with final HEP in order to indicate improved strength and decreased fall risk.    Baseline  initial HEP administered on eval 03/02/19    Time  12    Period  Weeks    Status  New    Target Date  05/25/19      PT LONG TERM GOAL #2   Title  Pt will decrease 5TSTS to less than 10 seconds in order to demonstrate clinically significant improvement in LE strength and functional mobility.    Baseline  03/02/2019: 17 seconds without UE support, dependent on RLE, very little weight shift to LLE    Time  12    Period  Weeks    Status  New    Target Date  05/25/19      PT LONG TERM GOAL #3   Title  Pt will perform > 1071ft with LRAD at mod I level in order to indicate safe community/leisure negotiation.    Baseline  03/02/2019 52ft    Time  12    Period  Weeks    Status  New    Target Date  05/25/19      PT LONG TERM GOAL #4   Title  The patient will demonstrate at least 1/2 grade improvement in LE MMT grade to improve ability to perform functional activities.    Baseline  see eval for details 03/02/2019    Time  12    Period  Weeks    Status  New    Target Date  05/25/19      PT LONG TERM GOAL #5   Title  Pt will ambulate >1 m/s with LRAD mod I to indicate gait velocity of community  ambulator.    Baseline  self selected: .57 m/s, fastest .9m/s with quad cane    Time  12    Period  Weeks    Status  New    Target Date  05/25/19            Plan - 03/03/19 1556    Clinical Impression Statement  Pt is a 24 year-old female that presented to clinic after CVA with L hemiplegia/hemiparesis. Pt experienced a CVA after drug overdose in 01/2019, and attended CIR prior to outpatient PT evaluation. Upon assessment the patient exhibited deficits in LE and UE ROM and strength, as well as gait and balance abnormalities. These deficits limit the patient's ability to perform age appropriate functional activities/ADLs/recreational activities such as lifting, walking, squatting, stair navigation, work duties, Catering manager. The patient would benefit from further skilled PT intervention to address these limitations to maximize patient independence, mobility, and decrease risk of falls.    Personal Factors and Comorbidities  Behavior Pattern    Examination-Activity Limitations  Bathing;Dressing;Sit;Transfers;Sleep;Bed Mobility;Bend;Caring for Others;Carry;Toileting;Reach Overhead;Stand;Locomotion Level;Stairs;Squat;Lift;Hygiene/Grooming    Examination-Participation Restrictions  Church;Interpersonal Relationship;Personal Finances;Yard Work;Cleaning;Laundry;School;Community Activity;Medication Management;Driving;Meal Prep;Shop    Stability/Clinical Decision Making  Evolving/Moderate complexity    Clinical Decision Making  Moderate    Rehab Potential  Good    PT Frequency  2x / week    PT Duration  12 weeks    PT Treatment/Interventions  ADLs/Self Care Home Management;DME Instruction;Gait training;Stair training;Functional mobility training;Therapeutic activities;Therapeutic exercise;Balance training;Neuromuscular re-education;Cognitive remediation;Patient/family education;Vestibular;Energy conservation;Cryotherapy;Ultrasound;Electrical Stimulation;Fluidtherapy;Iontophoresis 4mg /ml Dexamethasone;Canalith  Repostioning;Moist Heat;Traction;Passive range of motion;Manual techniques;Joint Manipulations;Spinal Manipulations;Splinting;Taping;Scar mobilization;Wheelchair mobility training;Prosthetic  Training;Orthotic Fit/Training;Dry needling;Visual/perceptual remediation/compensation;Aquatic Therapy    PT Next Visit Plan  higher level balance assessment, FOTO    PT Home Exercise Plan  Access Code:    Consulted and Agree with Plan of Care  Patient;Other (Comment)   boyfriend [name]      Patient will benefit from skilled therapeutic intervention in order to improve the following deficits and impairments:  Decreased activity tolerance, Decreased balance, Decreased cognition, Decreased endurance, Decreased knowledge of use of DME, Decreased mobility, Decreased strength, Impaired perceived functional ability, Postural dysfunction, Abnormal gait, Difficulty walking, Impaired tone, Decreased range of motion, Decreased coordination, Impaired UE functional use, Pain  Visit Diagnosis: Right middle cerebral artery stroke (HCC)  Muscle weakness (generalized)  Other abnormalities of gait and mobility  Acute pain of left shoulder     Problem List Patient Active Problem List   Diagnosis Date Noted  . Spastic hemiparesis (HCC)   . Elevated BUN   . Vascular headache   . Mood disorder in conditions classified elsewhere   . 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 Polysubstance abuse (HCC)   . Dyslipidemia   . Ischemic stroke diagnosed during current admission (HCC) 02/13/2019  . MDD (major depressive disorder), recurrent episode, moderate (HCC) 02/10/2019  . Dysphagia 02/09/2019  . Elevated CK 02/09/2019  . CVA (cerebral vascular accident) (HCC) 02/07/2019  . Cellulitis of arm 09/20/2017  . IV drug abuse (HCC) 09/20/2017  . Quadriceps weakness 06/18/2016  . TBI (traumatic brain injury) (HCC) 05/01/2016  . 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 Respiratory distress   .  Glasgow coma scale total score 3-8 (HCC)   . Pneumonia of both lower lobes due to methicillin resistant Staphylococcus aureus (MRSA) (HCC)   . Acute pulmonary edema (HCC)   . Non-traumatic rhabdomyolysis   . Opioid abuse (HCC)   . Elevated troponin   . Acute respiratory failure with hypoxia (HCC)   . Drug overdose   . Elevated liver enzymes   . Encephalopathy acute   . Encephalopathy 04/19/2016  . Opiate overdose (HCC) 04/19/2016    Olga Coaster PT, DPT 4:17 PM,03/03/19   [place] [facility] MAIN [facility] SERVICES [address] [place], [place], [address] Phone: [phone]   Fax:  [phone]  Name: Jane Watkins MRN: [id number] Date of Birth: [birth year]

## 2019-03-02 NOTE — Patient Instructions (Signed)
Access Code:  URL: [email or web address]  Date: 03/02/2019  Prepared by: Olga Coaster   Exercises Sit to Stand without Arm Support - 10 reps - 1 sets - 1x daily - 7x weekly Seated Hip Abduction with Resistance - 20 reps - 3 sets - 1x daily                            - 7x weekly Seated Long Arc Quad - 15 reps - 2 sets - 3 hold - 1x daily - 7x weekly

## 2019-03-07 ENCOUNTER — Other Ambulatory Visit: Payer: Self-pay

## 2019-03-07 ENCOUNTER — Encounter: Payer: Self-pay | Admitting: Occupational Therapy

## 2019-03-07 ENCOUNTER — Ambulatory Visit: Payer: 59 | Admitting: Occupational Therapy

## 2019-03-07 DIAGNOSIS — M6281 Muscle weakness (generalized): Secondary | ICD-10-CM

## 2019-03-07 DIAGNOSIS — I63511 Cerebral infarction due to unspecified occlusion or stenosis of right middle cerebral artery: Secondary | ICD-10-CM | POA: Diagnosis not present

## 2019-03-07 NOTE — Therapy (Signed)
[facility] MAIN [facility] SERVICES [address] [place], [place], [address] Phone: [phone]   Fax:  [phone]  Occupational Therapy Evaluation  Patient Details  Name: Jane Watkins MRN: [id number] Date of Birth: 12-05-1995 Referring Provider (OT): Dr. [name]   Encounter Date: 03/07/2019  OT End of Session - 03/07/19 1242    Visit Number  1    Number of Visits  24    Date for OT Re-Evaluation  05/30/19    Authorization Type  Progress report period starting 03/07/2019    OT Start Time  1116    OT Stop Time  1210    OT Time Calculation (min)  54 min    Activity Tolerance  Patient tolerated treatment well    Behavior During Therapy  Flat affect       Past Medical History:  Diagnosis Date  . AKI (acute kidney injury) (HCC) 2018   "from overdose"  . Anxiety   . Daily headache   . Depression   . Drug overdose 04/2016   [name] 05/01/2016  . GERD (gastroesophageal reflux disease)    "when I was younger; gone now" (09/21/2017)  . Migraine    "q couple weeks" (09/21/2017)  . Overdose     Past Surgical History:  Procedure Laterality Date  . APPENDECTOMY  04/2013  . I & D EXTREMITY Left 09/20/2017   Procedure: IRRIGATION AND DEBRIDEMENT LEFT ARM;  Surgeon: Bradly Bienenstock, MD;  [facility]: [facility] OR;  Service: Orthopedics;  Laterality: Left;  . TEE WITHOUT CARDIOVERSION N/A 02/10/2019   Procedure: TRANSESOPHAGEAL ECHOCARDIOGRAM (TEE);  Surgeon: Debbe Odea, MD;  Location: [facility] ORS;  Service: Cardiovascular;  Laterality: N/A;  Polysubstance abuser  . TONGUE SURGERY  ~ 2007   "related to lisp"  . TRACHEOSTOMY  04/2016   [name] 05/01/2016    There were no vitals filed for this visit.  Subjective Assessment - 03/07/19 1303    Subjective   Pt. is requesting a GivMohr sling.    Pertinent History  Per chart. Pt. is a 24 y.o.female with a history of IV drug use, polysubstance as well as tobacco abuse, anxiety. Pt. presented to [facility] on 02/07/2019  after being found unresponsive.  EMS contacted patient received Narcan with some improvement in mental status.  Admission labs with WBC 20,700, potassium 5.2, BUN 25, creatinine 0.67, urine drug screen positive cocaine, marijuana and benzodiazepines, alcohol level less than 10, SARS coronavirus negative.  Cranial CT scan showed areas of indistinct low-density in the right basal ganglia with mass-effect on the right lateral ventricle.  Patient did not receive TPA.  MRI showed acute infarction of right MCA territory, affecting the basal ganglia and affecting the cortical and subcortical brain in a largely watershed distribution. Pt. had a previous inpatient rehab admission 05/01/2016 to 05/09/2016 for hypoxic encephalopathy after drug overdose requiring tracheostomy, and was later decannulated. Pt. resides with her boyfriend in an apartment. Pt. worked full time as a Production designer, theatre/television/film at [employer]. pt. enjoys travelling.    Currently in Pain?  No/denies        [facility] OT Assessment - 03/07/19 1127      Assessment   Medical Diagnosis  CVA    Referring Provider (OT)  Dr. [name]    Onset Date/Surgical Date  02/07/19    Next MD Visit  03/11/2019    Prior Therapy  yes      Precautions   Required Braces or Orthoses  --   Left hand splint for nighttime, and  left AFO.     Restrictions   Weight Bearing Restrictions  No      Balance Screen   Has the patient fallen in the past 6 months  No    Has the patient had a decrease in activity level because of a fear of falling?   No    Is the patient reluctant to leave their home because of a fear of falling?   No      Home  Environment   Family/patient expects to be discharged to:  Private residence    Living Arrangements  Spouse/significant other    Available Help at Discharge  Family    Type of [place] Access  Level entry    [place] Shower/Tub  Tub/Shower unit    Shower/tub characteristics  Curtain    Bathroom Toilet   Standard    How accessible  Accessible via Horticulturist, commercial - 2 wheels;Cane -quad;Shower seat;Grab bars - tub/shower;Wheelchair - manual    Lives With  Significant other      Prior Function   Level of Independence  Independent with basic ADLs    Vocation  Full time employment    Leisure centre manager at [employer]      ADL   Eating/Feeding  [place] with utensil use. Assist with cutting food.   Grooming  --   Assist washing hair. modified Ind. oral care.   Upper Body Bathing  Moderate assistance    Lower Body Bathing  Minimal assistance    Upper Body Dressing  Minimal assistance    Lower Body Dressing  Moderate assistance    Toilet Transfer  Modified independent    Toileting - Clothing Manipulation  Independent   Unable to use her left hand to hike.   Toileting -  Control and instrumentation engineer  Supervision/safety      IADL   Prior Level of Function Shopping  Independent    Shopping  Needs to be accompanied on any shopping trip    Prior Level of Function Light Housekeeping  Independent    Light Housekeeping  Performs light daily tasks such as dishwashing, bed making    Prior Level of Function Meal Prep  Independent    Meal Prep  Able to complete simple cold meal and snack prep    Prior Level of Function Sales executive  Relies on family or friends for transportation    Prior Level of Function Medication Managment  Independent    Medication Management  Is not capable of dispensing or managing own medication    Prior Level of Function Financial Management  Independent   Independent   Financial Management  Requires assistance      Mobility   Mobility Status  Needs assist      Written Expression   Dominant Hand  Left      Vision - History   Baseline Vision  Wears glasses for distance only      Activity Tolerance    Activity Tolerance  Tolerates 10-20 min activity with multiple rests      Cognition   Overall Cognitive Status  Within Functional Limits for tasks assessed      Sensation   Light Touch  Appears Intact  Proprioception  Appears Intact      Coordination   Gross Motor Movements are Fluid and Coordinated  No    Fine Motor Movements are Fluid and Coordinated  No      AROM   Overall AROM Comments  Left shoulder flexion attempts lead to 42 degrees of shoulder abduction.  Abduction: 56, elbow 30-126,  No active wrist, digit, or thumb responses.       PROM   Overall PROM Comments  Full LUE PROM                            OT Long Term Goals - 03/07/19 1814      OT LONG TERM GOAL #1   Title  Pt. will increase active isolated left shoulder flexion by 20 degrees in preparation for combing her hair.    Baseline  Eval: No isolated shoulder shoulder flexion. Pt. compensates with synergystic movement with attempts for flexion.    Time  12    Period  Weeks    Status  New    Target Date  05/30/19      OT LONG TERM GOAL #2   Title  Pt. will increase active shoulder abduction by 20 degrees in preparation to be able to wash her hair.    Baseline  Eval: Active shoulder abduction 56 degrees.    Time  12    Period  Weeks    Status  New    Target Date  05/30/19      OT LONG TERM GOAL #3   Title  Pt. will improve hand to face patterns using her left hand to be able to independently wash her face.    Baseline  Eval: Pt. is unable to wash her face using her left hand    Time  12    Period  Weeks    Status  New    Target Date  05/30/19      OT LONG TERM GOAL #4   Title  Pt. will increase AROM wrist extension by 10 degrees in preparation for reaching for a cup    Baseline  Eval: 0 degrees of active wrist extension.    Time  12    Period  Weeks    Status  New    Target Date  05/30/19      OT LONG TERM GOAL #5   Title  Pt. will increase left digit flexion to be able to  initiate actively holding a toothbrush.    Baseline  Eval: Pt. is unable to use her left hand to hold a toothbrush.    Time  12    Period  Weeks    Target Date  05/30/19            Plan - 03/07/19 1802    Clinical Impression Statement  Pt. is a 24 y.o. female who arrived for outpatient OT services following a CVA with LUE weakness. Pt. presents with decreased initiation of AROM in the left shoulder, forearm, wrist, digits, and thumb. Pt. is able to actively initiate active elbow flexion, and extension consistently upon command. Pt. is unable to use her dominant LUE to perform daily ADL, and IADL tasks. Pt. will benefit from OT skilled services to work on facilitating consistent active movement with the LUE, and hand in order to improve her ability to engage it in daily ADL, and IADL tasks.    OT Occupational Profile and History  Detailed Assessment- Review of Records and additional review of physical, cognitive, psychosocial history related to current functional performance    Occupational performance deficits (Please refer to evaluation for details):  ADL's;IADL's    Body Structure / Function / Physical Skills  ADL;IADL;Coordination;Endurance;UE functional use;Decreased knowledge of precautions;Dexterity;FMC;ROM;Tone;Strength    Rehab Potential  Good    Clinical Decision Making  Several treatment options, min-mod task modification necessary    Comorbidities Affecting Occupational Performance:  May have comorbidities impacting occupational performance    Modification or Assistance to Complete Evaluation   Min-Moderate modification of tasks or assist with assess necessary to complete eval    OT Frequency  2x / week    OT Duration  12 weeks    OT Treatment/Interventions  Self-care/ADL training;DME and/or AE instruction;Energy conservation;Therapeutic activities;Patient/family education;Therapeutic exercise;Neuromuscular education    Consulted and Agree with Plan of Care  Patient        Patient will benefit from skilled therapeutic intervention in order to improve the following deficits and impairments:   Body Structure / Function / Physical Skills: ADL, IADL, Coordination, Endurance, UE functional use, Decreased knowledge of precautions, Dexterity, FMC, ROM, Tone, Strength       Visit Diagnosis: Muscle weakness (generalized)    Problem List Patient Active Problem List   Diagnosis Date Noted  . Spastic hemiparesis (HCC)   . Elevated BUN   . Vascular headache   . Mood disorder in conditions classified elsewhere   . 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 Polysubstance abuse (HCC)   . Dyslipidemia   . Ischemic stroke diagnosed during current admission (HCC) 02/13/2019  . MDD (major depressive disorder), recurrent episode, moderate (HCC) 02/10/2019  . Dysphagia 02/09/2019  . Elevated CK 02/09/2019  . CVA (cerebral vascular accident) (HCC) 02/07/2019  . Cellulitis of arm 09/20/2017  . IV drug abuse (HCC) 09/20/2017  . Quadriceps weakness 06/18/2016  . TBI (traumatic brain injury) (HCC) 05/01/2016  . 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 Respiratory distress   . Glasgow coma scale total score 3-8 (HCC)   . Pneumonia of both lower lobes due to methicillin resistant Staphylococcus aureus (MRSA) (HCC)   . Acute pulmonary edema (HCC)   . Non-traumatic rhabdomyolysis   . Opioid abuse (HCC)   . Elevated troponin   . Acute respiratory failure with hypoxia (HCC)   . Drug overdose   . Elevated liver enzymes   . Encephalopathy acute   . Encephalopathy 04/19/2016  . Opiate overdose (HCC) 04/19/2016    Olegario Messier, MS, OTR/L 03/07/2019, 10:01 PM  [facility] MAIN [facility] SERVICES [address] [place], [place], [address] Phone: [phone]   Fax:  [phone]  Name: Brandie Lopes MRN: [id number] Date of Birth: 1995-05-30

## 2019-03-10 ENCOUNTER — Ambulatory Visit: Payer: 59 | Admitting: Physical Therapy

## 2019-03-10 ENCOUNTER — Encounter: Payer: Self-pay | Admitting: Physical Therapy

## 2019-03-10 ENCOUNTER — Other Ambulatory Visit: Payer: Self-pay

## 2019-03-10 DIAGNOSIS — I639 Cerebral infarction, unspecified: Secondary | ICD-10-CM

## 2019-03-10 DIAGNOSIS — R41844 Frontal lobe and executive function deficit: Secondary | ICD-10-CM

## 2019-03-10 DIAGNOSIS — I63511 Cerebral infarction due to unspecified occlusion or stenosis of right middle cerebral artery: Secondary | ICD-10-CM

## 2019-03-10 DIAGNOSIS — M6281 Muscle weakness (generalized): Secondary | ICD-10-CM

## 2019-03-10 DIAGNOSIS — R2689 Other abnormalities of gait and mobility: Secondary | ICD-10-CM

## 2019-03-10 DIAGNOSIS — M25512 Pain in left shoulder: Secondary | ICD-10-CM

## 2019-03-10 NOTE — Therapy (Signed)
[facility] MAIN [facility] SERVICES [address] [place], [place], [address] Phone: [phone]   Fax:  [phone]  Physical Therapy Treatment  Patient Details  Name: Jane Watkins MRN: [id number] Date of Birth: [birth year] Referring Provider (PT): Dr. Herma Carson. [name]   Encounter Date: 03/10/2019  PT End of Session - 03/10/19 1648    Visit Number  2    Number of Visits  24    Date for PT Re-Evaluation  05/25/19    Authorization - Visit Number  1    PT Start Time  1500    PT Stop Time  1542    PT Time Calculation (min)  42 min    Equipment Utilized During Treatment  Gait belt    Activity Tolerance  Patient tolerated treatment well    Behavior During Therapy  Flat affect       Past Medical History:  Diagnosis Date  . AKI (acute kidney injury) (HCC) 2018   "from overdose"  . Anxiety   . Daily headache   . Depression   . Drug overdose 04/2016   [name] 05/01/2016  . GERD (gastroesophageal reflux disease)    "when I was younger; gone now" (09/21/2017)  . Migraine    "q couple weeks" (09/21/2017)  . Overdose     Past Surgical History:  Procedure Laterality Date  . APPENDECTOMY  04/2013  . I & D EXTREMITY Left 09/20/2017   Procedure: IRRIGATION AND DEBRIDEMENT LEFT ARM;  Surgeon: Bradly Bienenstock, MD;  Location: [facility] OR;  Service: Orthopedics;  Laterality: Left;  . TEE WITHOUT CARDIOVERSION N/A 02/10/2019   Procedure: TRANSESOPHAGEAL ECHOCARDIOGRAM (TEE);  Surgeon: Debbe Odea, MD;  Location: [facility] ORS;  Service: Cardiovascular;  Laterality: N/A;  Polysubstance abuser  . TONGUE SURGERY  ~ 2007   "related to lisp"  . TRACHEOSTOMY  04/2016   [name] 05/01/2016    There were no vitals filed for this visit.  Subjective Assessment - 03/10/19 1647    Subjective  Patient says that she is getting better.    Patient is accompained by:  --   significant other   Pertinent History  Pt is 24 y.o. female presenting to hospital 02/07/19 initially,  MRI  showing acute infarct R MCA territory affecting basal ganglia and affecting cortical and subcortical brain in a largely watershed distribution. After in hospital stay patient transitioned to CIR from 02/16/2019-03/01/2019.  PMH includes IV drug abuse, anxiety, depression, migraines, AKI, hypoxic ischemic encephalopathy 2018, opiate overdose 2018, h/o trach 2018, h/o I&D L UE. PLOF: lives with boyfriend in one story apartment, prior to CVA patient was independent, working.    Limitations  Walking;Reading;Lifting;Writing;House hold activities;Standing    How long can you sit comfortably?  NA    How long can you stand comfortably?  20 mins    How long can you walk comfortably?  20 mins    Diagnostic tests  see MRI results    Patient Stated Goals  Patient wants to maximize how much she can walk, get back to normal    Currently in Pain?  No/denies    Pain Score  0-No pain    Pain Onset  1 to 4 weeks ago        Treatment: Nu-step with assist for LLE knee ER to stay in neutral x 8 mins L2 Gait without AD and 100 feet with L AFO Standing in parallel bars and side stepping left and right and keeping toe pointing fwd x 10 x 4  Lunge from floor to stool x 10 LLE and RLE Step ups to 5 inch stool leading with left foot and then 10 x leading with right foot Prone on elbows and weight shifting left and right  Quadriped AI hips and shoulders x 5 mins Prone left knee flex with stabilization pelvis x 10 and assist for L knee flex, manual resistance for L knee extension x 10  Seated LLE knee extension, hip flex x 10  Pt educated throughout session about proper posture and technique with exercises. Improved exercise technique, movement at target joints, use of target muscles after min to mod verbal, visual, tactile cues.                       PT Education - 03/10/19 1648    Education provided  Yes    Education Details  HEP    Person(s) Educated  Patient    Methods  Explanation     Comprehension  Verbalized understanding;Tactile cues required;Need further instruction;Returned demonstration       PT Short Term Goals - 03/02/19 1542      PT SHORT TERM GOAL #1   Title  Pt will be independent with initial HEP in order to indicate improved strength and decreased fall risk.    Baseline  initial HEP administered on eval 03/02/2019    Time  6    Period  Weeks    Status  New    Target Date  04/13/19        PT Long Term Goals - 03/02/19 1543      PT LONG TERM GOAL #1   Title  Pt will be independent with final HEP in order to indicate improved strength and decreased fall risk.    Baseline  initial HEP administered on eval 03/02/19    Time  12    Period  Weeks    Status  New    Target Date  05/25/19      PT LONG TERM GOAL #2   Title  Pt will decrease 5TSTS to less than 10 seconds in order to demonstrate clinically significant improvement in LE strength and functional mobility.    Baseline  03/02/2019: 17 seconds without UE support, dependent on RLE, very little weight shift to LLE    Time  12    Period  Weeks    Status  New    Target Date  05/25/19      PT LONG TERM GOAL #3   Title  Pt will perform > 1031ft with LRAD at mod I level in order to indicate safe community/leisure negotiation.    Baseline  03/02/2019 561ft    Time  12    Period  Weeks    Status  New    Target Date  05/25/19      PT LONG TERM GOAL #4   Title  The patient will demonstrate at least 1/2 grade improvement in LE MMT grade to improve ability to perform functional activities.    Baseline  see eval for details 03/02/2019    Time  12    Period  Weeks    Status  New    Target Date  05/25/19      PT LONG TERM GOAL #5   Title  Pt will ambulate >1 m/s with LRAD mod I to indicate gait velocity of community ambulator.    Baseline  self selected: .57 m/s, fastest .76m/s with quad cane    Time  12    Period  Weeks    Status  New    Target Date  05/25/19            Plan - 03/10/19  1649    Clinical Impression Statement  Patient was instructed in HEP including prone knee flex, side stepping left and right , lunges. She performs seated, standing and prone exercises with min assist to allow correct muscles and not allow synergistic patterns. Patient will continue to benefit from skilled PT to improve mobility and strength.    Personal Factors and Comorbidities  Behavior Pattern    Examination-Activity Limitations  Bathing;Dressing;Sit;Transfers;Sleep;Bed Mobility;Bend;Caring for Others;Carry;Toileting;Reach Overhead;Stand;Locomotion Level;Stairs;Squat;Lift;Hygiene/Grooming    Examination-Participation Restrictions  Church;Interpersonal Relationship;Personal Finances;Yard Work;Cleaning;Laundry;School;Community Activity;Medication Management;Driving;Meal Prep;Shop    Stability/Clinical Decision Making  Evolving/Moderate complexity    Rehab Potential  Good    PT Frequency  2x / week    PT Duration  12 weeks    PT Treatment/Interventions  ADLs/Self Care Home Management;DME Instruction;Gait training;Stair training;Functional mobility training;Therapeutic activities;Therapeutic exercise;Balance training;Neuromuscular re-education;Cognitive remediation;Patient/family education;Vestibular;Energy conservation;Cryotherapy;Ultrasound;Electrical Stimulation;Fluidtherapy;Iontophoresis 4mg /ml Dexamethasone;Canalith Repostioning;Moist Heat;Traction;Passive range of motion;Manual techniques;Joint Manipulations;Spinal Manipulations;Splinting;Taping;Scar mobilization;Wheelchair mobility training;Prosthetic Training;Orthotic Fit/Training;Dry needling;Visual/perceptual remediation/compensation;Aquatic Therapy    PT Next Visit Plan  higher level balance assessment, FOTO    PT Home Exercise Plan  Access Code:    Consulted and Agree with Plan of Care  Patient;Other (Comment)   boyfriend      Patient will benefit from skilled therapeutic intervention in order to improve the following  deficits and impairments:  Decreased activity tolerance, Decreased balance, Decreased cognition, Decreased endurance, Decreased knowledge of use of DME, Decreased mobility, Decreased strength, Impaired perceived functional ability, Postural dysfunction, Abnormal gait, Difficulty walking, Impaired tone, Decreased range of motion, Decreased coordination, Impaired UE functional use, Pain  Visit Diagnosis: Muscle weakness (generalized)  Right middle cerebral artery stroke (HCC)  Other abnormalities of gait and mobility  Acute pain of left shoulder  Ischemic stroke diagnosed during current admission (HCC)  Frontal lobe and executive function deficit     Problem List Patient Active Problem List   Diagnosis Date Noted  . Spastic hemiparesis (HCC)   . Elevated BUN   . Vascular headache   . Mood disorder in conditions classified elsewhere   . 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 Polysubstance abuse (HCC)   . Dyslipidemia   . Ischemic stroke diagnosed during current admission (HCC) 02/13/2019  . MDD (major depressive disorder), recurrent episode, moderate (HCC) 02/10/2019  . Dysphagia 02/09/2019  . Elevated CK 02/09/2019  . CVA (cerebral vascular accident) (HCC) 02/07/2019  . Cellulitis of arm 09/20/2017  . IV drug abuse (HCC) 09/20/2017  . Quadriceps weakness 06/18/2016  . TBI (traumatic brain injury) (HCC) 05/01/2016  . 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 Respiratory distress   . Glasgow coma scale total score 3-8 (HCC)   . Pneumonia of both lower lobes due to methicillin resistant Staphylococcus aureus (MRSA) (HCC)   . Acute pulmonary edema (HCC)   . Non-traumatic rhabdomyolysis   . Opioid abuse (HCC)   . Elevated troponin   . Acute respiratory failure with hypoxia (HCC)   . Drug overdose   . Elevated liver enzymes   . Encephalopathy acute   . Encephalopathy 04/19/2016  . Opiate overdose (HCC) 04/19/2016    06/19/2016, Ezekiel Ina  DPT 03/10/2019, 4:50 PM  [facility] MAIN [facility] SERVICES [address] [place], [place], [place] Phone: [phone]   Fax:  [phone]  Name: Jane Watkins MRN: [id number] Date of Birth: [birth year]

## 2019-03-11 ENCOUNTER — Encounter: Payer: 59 | Attending: Physical Medicine and Rehabilitation | Admitting: Physical Medicine and Rehabilitation

## 2019-03-11 ENCOUNTER — Other Ambulatory Visit: Payer: Self-pay

## 2019-03-11 ENCOUNTER — Encounter: Payer: Self-pay | Admitting: Physical Medicine and Rehabilitation

## 2019-03-11 VITALS — BP 105/73 | HR 80 | Temp 97.8°F | Ht 62.0 in | Wt 120.0 lb

## 2019-03-11 DIAGNOSIS — I63511 Cerebral infarction due to unspecified occlusion or stenosis of right middle cerebral artery: Secondary | ICD-10-CM

## 2019-03-11 DIAGNOSIS — G44221 Chronic tension-type headache, intractable: Secondary | ICD-10-CM | POA: Diagnosis not present

## 2019-03-11 DIAGNOSIS — F411 Generalized anxiety disorder: Secondary | ICD-10-CM | POA: Insufficient documentation

## 2019-03-11 DIAGNOSIS — F5102 Adjustment insomnia: Secondary | ICD-10-CM | POA: Diagnosis not present

## 2019-03-11 MED ORDER — VARENICLINE TARTRATE 0.5 MG PO TABS: 0.5000 mg | ORAL_TABLET | Freq: Two times a day (BID) | ORAL | 0 refills | Status: DC

## 2019-03-11 MED ORDER — BUSPIRONE HCL 5 MG PO TABS: 5.0000 mg | ORAL_TABLET | Freq: Two times a day (BID) | ORAL | Status: DC

## 2019-03-11 MED ORDER — NICOTINE 21 MG/24HR TD PT24: MEDICATED_PATCH | TRANSDERMAL | 0 refills | Status: DC

## 2019-03-11 MED ORDER — TOPIRAMATE 50 MG PO TABS: 75.0000 mg | ORAL_TABLET | Freq: Two times a day (BID) | ORAL | 0 refills | Status: DC

## 2019-03-11 MED ORDER — QUETIAPINE FUMARATE 25 MG PO TABS: 75.0000 mg | ORAL_TABLET | Freq: Every day | ORAL | 0 refills | Status: DC

## 2019-03-11 NOTE — Progress Notes (Signed)
Subjective:    Patient ID: Jane Watkins, female    DOB: 1995/04/24, 24 y.o.   MRN: [id number]  HPI  Jane Watkins presents for transitional care follow-up after CIR admission for R MCA stroke.  She has noted much improved mobility in her L arm! She has been working with outpatient PT and OT and is now able to move arm antigravity.   She has been having cigarette cravings and said there was an issue in picking up her Nicotine patch. Asks for a new prescription as well as a prescription for Chantix, which she has never taken before.  Her boy friend at bedside mentions that she has been having headache. She has been taking Topiramate 50mg  BID and asks whether it is normal for her to still be having daily headaches.   She has been having insomnia and ran out of her Seroquel.    Pain Inventory Average Pain 2 Pain Right Now 0 My pain is sharp and aching  In the last 24 hours, has pain interfered with the following? General activity 2 Relation with others 2 Enjoyment of life 2 What TIME of day is your pain at its worst? evening Sleep (in general) Fair  Pain is worse with: . Pain improves with: medication Relief from Meds: 3  Mobility walk with assistance ability to climb steps?  yes do you drive?  no use a wheelchair  Function disabled: date disabled . I need assistance with the following:  feeding, dressing, bathing, meal prep, household duties and shopping  Neuro/Psych weakness numbness tremor tingling trouble walking spasms dizziness confusion depression anxiety  Prior Studies Any changes since last visit?  no  Physicians involved in your care Any changes since last visit?  no   Family History  Problem Relation Age of Onset  . Anemia Father    Social History   Socioeconomic History  . Marital status: Legally Separated    Spouse name: Not on file  . Number of children: Not on file  . Years of education: Not on file  . Highest education level: Not on file    Occupational History  . Not on file  Tobacco Use  . Smoking status: Current Every Day Smoker    Packs/day: 0.50    Years: 10.00    Pack years: 5.00    Types: Cigarettes  . Smokeless tobacco: Never Used  Substance and Sexual Activity  . Alcohol use: No  . Drug use: Yes    Types: IV, Heroin, Cocaine    Comment: 09/21/2017 "cocaine a couple times in the last month; clean from heroin for 2 wks before yesterday; been struggling w/that for a couple years now""  . Sexual activity: Yes  Other Topics Concern  . Not on file  Social History Narrative  . Not on file   Social Determinants of Health   Financial Resource Strain:   . Difficulty of Paying Living Expenses: Not on file  Food Insecurity:   . Worried About Charity fundraiser in the Last Year: Not on file  . Ran Out of Food in the Last Year: Not on file  Transportation Needs:   . Lack of Transportation (Medical): Not on file  . Lack of Transportation (Non-Medical): Not on file  Physical Activity:   . Days of Exercise per Week: Not on file  . Minutes of Exercise per Session: Not on file  Stress:   . Feeling of Stress : Not on file  Social Connections:   . Frequency  of Communication with Friends and Family: Not on file  . Frequency of Social Gatherings with Friends and Family: Not on file  . Attends Religious Services: Not on file  . Active Member of Clubs or Organizations: Not on file  . Attends Banker Meetings: Not on file  . Marital Status: Not on file   Past Surgical History:  Procedure Laterality Date  . APPENDECTOMY  04/2013  . I & D EXTREMITY Left 09/20/2017   Procedure: IRRIGATION AND DEBRIDEMENT LEFT ARM;  Surgeon: Bradly Bienenstock, MD;  Location: [facility];  Service: Orthopedics;  Laterality: Left;  . TEE WITHOUT CARDIOVERSION N/A 02/10/2019   Procedure: TRANSESOPHAGEAL ECHOCARDIOGRAM (TEE);  Surgeon: Debbe Odea, MD;  Location: [facility] ORS;  Service: Cardiovascular;  Laterality: N/A;  Polysubstance  abuser  . TONGUE SURGERY  ~ 2007   "related to lisp"  . TRACHEOSTOMY  04/2016   Jane Watkins 05/01/2016   Past Medical History:  Diagnosis Date  . AKI (acute kidney injury) (HCC) 2018   "from overdose"  . Anxiety   . Daily headache   . Depression   . Drug overdose 04/2016   Jane Watkins 05/01/2016  . GERD (gastroesophageal reflux disease)    "when I was younger; gone now" (09/21/2017)  . Migraine    "q couple weeks" (09/21/2017)  . Overdose    BP 105/73   Pulse 80   Temp 97.8 F (36.6 C)   Ht 5\' 2"  (1.575 m)   Wt 120 lb (54.4 kg)   LMP 02/08/2019   SpO2 98%   BMI 21.95 kg/m   Opioid Risk Score:   Fall Risk Score:  `1  Depression screen PHQ 2/9  No flowsheet data found.   Review of Systems  Constitutional: Negative.   HENT: Negative.   Eyes: Negative.   Respiratory: Negative.   Cardiovascular: Negative.   Gastrointestinal: Negative.   Endocrine: Negative.   Genitourinary: Negative.   Musculoskeletal: Positive for gait problem and myalgias.  Skin: Negative.   Allergic/Immunologic: Negative.   Neurological: Positive for tremors, weakness, numbness and headaches.  Hematological: Bruises/bleeds easily.  Psychiatric/Behavioral: Positive for confusion and dysphoric mood. The patient is nervous/anxious.   All other systems reviewed and are negative.      Objective:   Physical Exam .Constitutional: No distress . Vital signs reviewed. HEENT: EOMI, oral membranes moist Neck: supple Cardiovascular: RRR without murmur. No JVD    Respiratory: CTA Bilaterally without wheezes or rales. Normal effort    GI: BS +, non-tender, non-distended  Skin: Warm and dry.  Intact. Psych: Flat. Musc: No edema in extremities.  No tenderness in extremities. Neurological: Alert Motor:  LUE: 3/5 throughout.  LLE: HF, KE 5/5, 0/5 ADF  MAS: tr 1+ LLE gastroc/trace hamstrings, and LUE elbow flexors      Assessment & Plan:  1. 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 Advised that she perform HEP daily on days she does not have therapy.  Mobility in LUE is much improved!  She has been able to walk 20-30 min daily.   2. Pain Management: Headaches, intractable             Increased Topamax to 75 mg twice daily  Currently taking Tizanidine 2mg  daily for spasticity. Advised that she  only take this as needed and take it at night preferably rather than daytime as can make her sleepy.             3. Insomnia             seroquel 75mg  qhs for better sleep. New script sent as patient ran out.   4. Depression:              -Continue prozac 40mg  for depression  5. Polysubstance abuse as well as tobacco abuse. Continues to counsel             -Prescribed Chantix and Nicotine patch to help with cravings. Discussed taper.   6. Hyperlipidemia. Continue Lipitor  7. Anxiety: Prescribed Buspar 5mg  BID.   Please return to clinic in 1 month.

## 2019-03-14 ENCOUNTER — Telehealth: Payer: Self-pay

## 2019-03-14 DIAGNOSIS — F411 Generalized anxiety disorder: Secondary | ICD-10-CM

## 2019-03-14 NOTE — Telephone Encounter (Signed)
Patient called stating that [facility] did not get the script for Buspar for patient.

## 2019-03-15 ENCOUNTER — Ambulatory Visit: Payer: 59 | Attending: Physical Medicine & Rehabilitation | Admitting: Occupational Therapy

## 2019-03-15 ENCOUNTER — Other Ambulatory Visit: Payer: Self-pay

## 2019-03-15 ENCOUNTER — Ambulatory Visit: Payer: 59 | Admitting: Physical Therapy

## 2019-03-15 DIAGNOSIS — R41844 Frontal lobe and executive function deficit: Secondary | ICD-10-CM | POA: Insufficient documentation

## 2019-03-15 DIAGNOSIS — M25512 Pain in left shoulder: Secondary | ICD-10-CM | POA: Insufficient documentation

## 2019-03-15 DIAGNOSIS — M6281 Muscle weakness (generalized): Secondary | ICD-10-CM | POA: Insufficient documentation

## 2019-03-15 DIAGNOSIS — I63511 Cerebral infarction due to unspecified occlusion or stenosis of right middle cerebral artery: Secondary | ICD-10-CM | POA: Insufficient documentation

## 2019-03-15 DIAGNOSIS — R278 Other lack of coordination: Secondary | ICD-10-CM | POA: Insufficient documentation

## 2019-03-15 DIAGNOSIS — I639 Cerebral infarction, unspecified: Secondary | ICD-10-CM | POA: Insufficient documentation

## 2019-03-15 DIAGNOSIS — R2689 Other abnormalities of gait and mobility: Secondary | ICD-10-CM | POA: Insufficient documentation

## 2019-03-15 MED ORDER — BUSPIRONE HCL 5 MG PO TABS: 5.0000 mg | ORAL_TABLET | Freq: Three times a day (TID) | ORAL | 1 refills | Status: DC

## 2019-03-15 NOTE — Telephone Encounter (Signed)
Reviewed medications; last order for Buspar was incorrectly entered as clinic medication. I have sent Buspar medication this morning.

## 2019-03-17 ENCOUNTER — Encounter: Payer: Self-pay | Admitting: Physical Therapy

## 2019-03-17 ENCOUNTER — Encounter: Payer: Self-pay | Admitting: Occupational Therapy

## 2019-03-17 ENCOUNTER — Other Ambulatory Visit: Payer: Self-pay

## 2019-03-17 ENCOUNTER — Ambulatory Visit: Payer: 59 | Admitting: Physical Therapy

## 2019-03-17 ENCOUNTER — Ambulatory Visit: Payer: 59 | Admitting: Occupational Therapy

## 2019-03-17 DIAGNOSIS — R2689 Other abnormalities of gait and mobility: Secondary | ICD-10-CM | POA: Diagnosis present

## 2019-03-17 DIAGNOSIS — M6281 Muscle weakness (generalized): Secondary | ICD-10-CM

## 2019-03-17 DIAGNOSIS — I63511 Cerebral infarction due to unspecified occlusion or stenosis of right middle cerebral artery: Secondary | ICD-10-CM | POA: Diagnosis present

## 2019-03-17 DIAGNOSIS — R278 Other lack of coordination: Secondary | ICD-10-CM | POA: Diagnosis present

## 2019-03-17 DIAGNOSIS — I639 Cerebral infarction, unspecified: Secondary | ICD-10-CM | POA: Diagnosis present

## 2019-03-17 DIAGNOSIS — M25512 Pain in left shoulder: Secondary | ICD-10-CM | POA: Diagnosis present

## 2019-03-17 DIAGNOSIS — R41844 Frontal lobe and executive function deficit: Secondary | ICD-10-CM | POA: Diagnosis present

## 2019-03-17 NOTE — Therapy (Signed)
[place] MAIN [facility] SERVICES [address] [place], [place], [address] Phone: [phone]   Fax:  [phone]  Occupational Therapy Treatment  Patient Details  Name: Jane Watkins MRN: [id number] Date of Birth: 08-02-1995 Referring Provider (OT): Dr. [name]   Encounter Date: 03/17/2019  OT End of Session - 03/17/19 1116    Visit Number  2    Number of Visits  24    Date for OT Re-Evaluation  05/30/19    Authorization Type  Progress report period starting 03/07/2019    OT Start Time  1020    OT Stop Time  1105    OT Time Calculation (min)  45 min    Activity Tolerance  Patient tolerated treatment well    Behavior During Therapy  Flat affect       Past Medical History:  Diagnosis Date  . AKI (acute kidney injury) ([place]) 2018   "from overdose"  . Anxiety   . Daily headache   . Depression   . Drug overdose 04/2016   [name] 05/01/2016  . GERD (gastroesophageal reflux disease)    "when I was younger; gone now" (09/21/2017)  . Migraine    "q couple weeks" (09/21/2017)  . Overdose     Past Surgical History:  Procedure Laterality Date  . APPENDECTOMY  04/2013  . I & D EXTREMITY Left 09/20/2017   Procedure: IRRIGATION AND DEBRIDEMENT LEFT ARM;  Surgeon: [name], MD;  Location: [facility];  Service: Orthopedics;  Laterality: Left;  . TEE WITHOUT CARDIOVERSION N/A 02/10/2019   Procedure: TRANSESOPHAGEAL ECHOCARDIOGRAM (TEE);  Surgeon: [name], MD;  Location: [facility] ORS;  Service: Cardiovascular;  Laterality: N/A;  Polysubstance abuser  . TONGUE SURGERY  ~ 2007   "related to lisp"  . TRACHEOSTOMY  04/2016   [name] 05/01/2016    There were no vitals filed for this visit.  Subjective Assessment - 03/17/19 1032    Subjective   Pt reports that she is using her LUE and hand more now.    Pertinent History  Per chart. Pt. is a 24 y.o.female with a history of IV drug use, polysubstance as well as tobacco abuse, anxiety. Pt. presented to [facility]  on 02/07/2019 after being found unresponsive.  EMS contacted patient received Narcan with some improvement in mental status.  Admission labs with WBC 20,700, potassium 5.2, BUN 25, creatinine 0.67, urine drug screen positive cocaine, marijuana and benzodiazepines, alcohol level less than 10, SARS coronavirus negative.  Cranial CT scan showed areas of indistinct low-density in the right basal ganglia with mass-effect on the right lateral ventricle.  Patient did not receive TPA.  MRI showed acute infarction of right MCA territory, affecting the basal ganglia and affecting the cortical and subcortical brain in a largely watershed distribution. Pt. had a previous inpatient rehab admission 05/01/2016 to 05/09/2016 for hypoxic encephalopathy after drug overdose requiring tracheostomy, and was later decannulated. Pt. resides with her boyfriend in an apartment. 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    Currently in Pain?  No/denies       OT Treatment session:    Manual therapy:  Pt reported no pain in L shoulder but had tightness in upper trapezius and rhomboids and provided trigger point releases and education in wall exercises to do at home for stretching and AROM.  Since she has more active movement in LUE she does not want to use a sling any longer.    Neuromuscular re-ed:  Pt. performed hand strengthening with yellow theraputty. Pt. required cues for proper technique. Pt. worked on gross grip, lateral pinch, 3pt. pinch, gross digit extension, thumb adduction, thumb opposition, and handout with exercises given. Pt. required verbal and tactile cues for proper technique. Recommended focusing on extension with 2 and 3pt flexion for thumb and first 2 fingers but not focus on flexing L hand due to increase in flexor tone present. She was able to use R hand with L to manipulate theraputty to place back in container with mod cues.  Patient response to treatment:  Pt continues to have a  flat affect during session but is motivated to regain as much return in functional movement in LUE and hand which is her dominant hand as possible.  She is able to actively flex to 150 degrees in shoulder flexion with elbow extended now and reports being able to get dressed and undressed without as much help now and is dressing L side first.                      OT Education - 03/17/19 1116    Education Details  yellow theraputty  for HEP    Person(s) Educated  Patient    Methods  Explanation;Demonstration;Tactile cues;Verbal cues;Handout    Comprehension  Verbalized understanding;Returned demonstration;Need further instruction          OT Long Term Goals - 03/07/19 1814      OT LONG TERM GOAL #1   Title  Pt. will increase active isolated left shoulder flexion by 20 degrees in preparation for combing her hair.    Baseline  Eval: No isolated shoulder shoulder flexion. Pt. compensates with synergystic movement with attempts for flexion.    Time  12    Period  Weeks    Status  New    Target Date  05/30/19      OT LONG TERM GOAL #2   Title  Pt. will increase active shoulder abduction by 20 degrees in preparation to be able to wash her hair.    Baseline  Eval: Active shoulder abduction 56 degrees.    Time  12    Period  Weeks    Status  New    Target Date  05/30/19      OT LONG TERM GOAL #3   Title  Pt. will improve hand to face patterns using her left hand to be able to independently wash her face.    Baseline  Eval: Pt. is unable to wash her face using her left hand    Time  12    Period  Weeks    Status  New    Target Date  05/30/19      OT LONG TERM GOAL #4   Title  Pt. will increase AROM wrist extension by 10 degrees in preparation for reaching for a cup    Baseline  Eval: 0 degrees of active wrist extension.    Time  12    Period  Weeks    Status  New    Target Date  05/30/19      OT LONG TERM GOAL #5   Title  Pt. will increase left digit flexion to  be able to initiate actively holding a toothbrush.    Baseline  Eval: Pt. is unable to use her left hand to hold a toothbrush.    Time  12    Period  Weeks    Target Date  05/30/19  Plan - 03/17/19 1117    Clinical Impression Statement  Pt was seen for neuromuscular re-ed, decreasing flexor tone and functional use of LUE and hand.  She was asking about obtaining a sling last session but today reports increased shoulder active movement and wants to wait on ordering one and would prefer to use her LUE which is her dominant hand.Pt educated in use of yellow theraputty at home with focus on 2 and 3 point pinch and extension of fingers and using R hand with L hand.  Pt reports that she is able to get dressed and undressed easier now that she has increased AROM in LUE to 150 degrees and no pain.  Pt will benefit from OT skilled services to work on facilitating consistent active movement of LUE for engagement in ADLs and IADLs    OT Occupational Profile and History  Detailed Assessment- Review of Records and additional review of physical, cognitive, psychosocial history related to current functional performance    Occupational performance deficits (Please refer to evaluation for details):  ADL's;IADL's    Body Structure / Function / Physical Skills  ADL;IADL;Coordination;Endurance;UE functional use;Decreased knowledge of precautions;Dexterity;FMC;ROM;Tone;Strength    Rehab Potential  Good    Clinical Decision Making  Several treatment options, min-mod task modification necessary    Comorbidities Affecting Occupational Performance:  May have comorbidities impacting occupational performance    OT Frequency  2x / week    OT Duration  12 weeks    OT Treatment/Interventions  Self-care/ADL training;DME and/or AE instruction;Energy conservation;Therapeutic activities;Patient/family education;Therapeutic exercise;Neuromuscular education    OT Home Exercise Plan  yellow theraputty    Consulted and  Agree with Plan of Care  Patient       Patient will benefit from skilled therapeutic intervention in order to improve the following deficits and impairments:   Body Structure / Function / Physical Skills: ADL, IADL, Coordination, Endurance, UE functional use, Decreased knowledge of precautions, Dexterity, FMC, ROM, Tone, Strength       Visit Diagnosis: Muscle weakness (generalized)  Right middle cerebral artery stroke [facility])    Problem List Patient Active Problem List   Diagnosis Date Noted  . Spastic hemiparesis (HCC)   . Elevated BUN   . Vascular headache   . Mood disorder in conditions classified elsewhere   . 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 Polysubstance abuse (HCC)   . Dyslipidemia   . Ischemic stroke diagnosed during current admission (HCC) 02/13/2019  . MDD (major depressive disorder), recurrent episode, moderate (HCC) 02/10/2019  . Dysphagia 02/09/2019  . Elevated CK 02/09/2019  . CVA (cerebral vascular accident) (HCC) 02/07/2019  . Cellulitis of arm 09/20/2017  . IV drug abuse (HCC) 09/20/2017  . Quadriceps weakness 06/18/2016  . TBI (traumatic brain injury) (HCC) 05/01/2016  . 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 Respiratory distress   . Glasgow coma scale total score 3-8 (HCC)   . Pneumonia of both lower lobes due to methicillin resistant Staphylococcus aureus (MRSA) (HCC)   . Acute pulmonary edema (HCC)   . Non-traumatic rhabdomyolysis   . Opioid abuse (HCC)   . Elevated troponin   . Acute respiratory failure with hypoxia (HCC)   . Drug overdose   . Elevated liver enzymes   . Encephalopathy acute   . Encephalopathy 04/19/2016  . Opiate overdose [facility]) 04/19/2016    Susanne Borders, OTR/L, [facility] ascom [phone] 03/17/19, 12:35 PM  [facility] MAIN [facility] SERVICES [address] [place], [place], [address] Phone:  [phone]   Fax:  [phone]  Name: Jane Watkins MRN:  [id number] Date of Birth: 05/15/1995

## 2019-03-17 NOTE — Patient Instructions (Signed)
Access Code: [email or web address]  URL: [email or web address]  Date: 03/17/2019  Prepared by: Zettie Pho   Exercises Quadruped Alternating Arm Lift - 10 reps - 2 sets - 1x daily - 7x weekly Quadruped Scapular Protraction and Retraction - 10 reps - 2 sets - 1x daily - 7x weekly Heel Sits - 10 reps - 2 sets - 1x daily - 7x weekly Sidelying Hip Abduction - 15 reps - 2 sets - 1x daily - 7x weekly Clamshell with Resistance - 15 reps - 2 sets - 1x daily - 7x weekly Supine Active Straight Leg Raise - 10 reps - 2 sets - 1x daily - 7x weekly Supine Quad Set - 10 reps - 2 sets - 5 sec hold - 1x daily - 7x weekly

## 2019-03-17 NOTE — Therapy (Signed)
[place] MAIN [facility] SERVICES [address] [place], [place], [address] Phone: [phone]   Fax:  [phone]  Physical Therapy Treatment  Patient Details  Name: Jane Watkins MRN: [id number] Date of Birth: 1995/10/15 Referring Provider (PT): Dr. Earnest Conroy. [name]   Encounter Date: 03/17/2019  PT End of Session - 03/17/19 1023    Visit Number  3    Number of Visits  24    Date for PT Re-Evaluation  05/25/19    Authorization - Visit Number  1    PT Start Time  1105    PT Stop Time  1145    PT Time Calculation (min)  40 min    Equipment Utilized During Treatment  Gait belt    Activity Tolerance  Patient tolerated treatment well    Behavior During Therapy  Flat affect       Past Medical History:  Diagnosis Date  . AKI (acute kidney injury) ([place]) 2018   "from overdose"  . Anxiety   . Daily headache   . Depression   . Drug overdose 04/2016   [name] 05/01/2016  . GERD (gastroesophageal reflux disease)    "when I was younger; gone now" (09/21/2017)  . Migraine    "q couple weeks" (09/21/2017)  . Overdose     Past Surgical History:  Procedure Laterality Date  . APPENDECTOMY  04/2013  . I & D EXTREMITY Left 09/20/2017   Procedure: IRRIGATION AND DEBRIDEMENT LEFT ARM;  Surgeon: [name], MD;  Location: [place];  Service: Orthopedics;  Laterality: Left;  . TEE WITHOUT CARDIOVERSION N/A 02/10/2019   Procedure: TRANSESOPHAGEAL ECHOCARDIOGRAM (TEE);  Surgeon: [name], MD;  Location: [facility];  Service: Cardiovascular;  Laterality: N/A;  Polysubstance abuser  . TONGUE SURGERY  ~ 2007   "related to lisp"  . TRACHEOSTOMY  04/2016   [name] 05/01/2016    There were no vitals filed for this visit.  Subjective Assessment - 03/17/19 1111    Subjective  Pt reports doing well; she denies any new falls; She reports adherence with HEP; She does report increased onset of LLE hip popping, no pain;    Patient is accompained by:  --   significant  other   Pertinent History  Pt is 24 y.o. female presenting to hospital 02/07/19 initially,  MRI showing acute infarct R MCA territory affecting basal ganglia and affecting cortical and subcortical brain in a largely watershed distribution. After in hospital stay patient transitioned to CIR from 02/16/2019-03/01/2019.  PMH includes IV drug abuse, anxiety, depression, migraines, AKI, hypoxic ischemic encephalopathy 2018, opiate overdose 2018, h/o trach 2018, h/o I&D L UE. PLOF: lives with boyfriend in one story apartment, prior to CVA patient was independent, working.    Limitations  Walking;Reading;Lifting;Writing;House hold activities;Standing    How long can you sit comfortably?  NA    How long can you stand comfortably?  20 mins    How long can you walk comfortably?  20 mins    Diagnostic tests  see MRI results    Patient Stated Goals  Patient wants to maximize how much she can walk, get back to normal    Currently in Pain?  No/denies    Pain Onset  1 to 4 weeks ago    Multiple Pain Sites  No       Treatment:   Instructed patient in advanced HEP: Qped: -alternate UE lift x10 reps each, attempted but too difficult for LUE, had patient lift RLE for increased  weight bearing through LUE with min A for tricep support.  -alternate LE lift x10 reps each, required min A with AAROM on LLE Required min VCs for proper weight shift and to increase back/core stabilization for better trunk control;  Sitting back on heels, coming up to tall kneeling with min VCs for posterior pelvic tilt for better core stabilization x10 reps with close supervision for safety; Required min VCs for neutral positioning to increase weight bearing to LLE.   Supine: SLR hip flexion 2 x10 reps LLE only with min VCS to keep knees extension for better quad control;  Single leg bridge with leg crossed across knee, LLE x15 reps;  Sidelying: Hip abduction x15 reps LLE Hip abduction clamshells yellow tband x15 LLE only with min  VCs for proper positioning and exercise technique;    Pt educated throughout session about proper posture and technique with exercises. Improved exercise technique, movement at target joints, use of target muscles after min to mod verbal, visual, tactile cues. She did require CGA and close supervision for safety awareness and min VCs for better trunk control and positioning for balance and safety; Reports mild fatigue at end of session;                        PT Education - 03/17/19 1023    Education provided  Yes    Education Details  LE strengthening, HEP, gait safety    Person(s) Educated  Patient    Methods  Explanation;Verbal cues    Comprehension  Verbalized understanding;Returned demonstration;Verbal cues required;Need further instruction       PT Short Term Goals - 03/02/19 1542      PT SHORT TERM GOAL #1   Title  Pt will be independent with initial HEP in order to indicate improved strength and decreased fall risk.    Baseline  initial HEP administered on eval 03/02/2019    Time  6    Period  Weeks    Status  New    Target Date  04/13/19        PT Long Term Goals - 03/02/19 1543      PT LONG TERM GOAL #1   Title  Pt will be independent with final HEP in order to indicate improved strength and decreased fall risk.    Baseline  initial HEP administered on eval 03/02/19    Time  12    Period  Weeks    Status  New    Target Date  05/25/19      PT LONG TERM GOAL #2   Title  Pt will decrease 5TSTS to less than 10 seconds in order to demonstrate clinically significant improvement in LE strength and functional mobility.    Baseline  03/02/2019: 17 seconds without UE support, dependent on RLE, very little weight shift to LLE    Time  12    Period  Weeks    Status  New    Target Date  05/25/19      PT LONG TERM GOAL #3   Title  Pt will perform > 1049ft with LRAD at mod I level in order to indicate safe community/leisure negotiation.    Baseline   03/02/2019 524ft    Time  12    Period  Weeks    Status  New    Target Date  05/25/19      PT LONG TERM GOAL #4   Title  The patient will demonstrate  at least 1/2 grade improvement in LE MMT grade to improve ability to perform functional activities.    Baseline  see eval for details 03/02/2019    Time  12    Period  Weeks    Status  New    Target Date  05/25/19      PT LONG TERM GOAL #5   Title  Pt will ambulate >1 m/s with LRAD mod I to indicate gait velocity of community ambulator.    Baseline  self selected: .57 m/s, fastest .35m/s with quad cane    Time  12    Period  Weeks    Status  New    Target Date  05/25/19            Plan - 03/17/19 1254    Clinical Impression Statement  Patient was instructed in advanced LE strengthening. Progressed HEP, see patient instructions. She does require min A for qped positioning and min VCs for correct exercise technique. Patient exhibits significant weakness in LLE and LUE which limits overall mobility. She verbalized and demonstrated understanding of exercise. She would benefit from additional skilled PT Intervention to improve strength, balance and gait safety;    Personal Factors and Comorbidities  Behavior Pattern    Examination-Activity Limitations  Bathing;Dressing;Sit;Transfers;Sleep;Bed Mobility;Bend;Caring for Others;Carry;Toileting;Reach Overhead;Stand;Locomotion Level;Stairs;Squat;Lift;Hygiene/Grooming    Examination-Participation Restrictions  Church;Interpersonal Relationship;Personal Finances;Yard Work;Cleaning;Laundry;School;Community Activity;Medication Management;Driving;Meal Prep;Shop    Stability/Clinical Decision Making  Evolving/Moderate complexity    Rehab Potential  Good    PT Frequency  2x / week    PT Duration  12 weeks    PT Treatment/Interventions  ADLs/Self Care Home Management;DME Instruction;Gait training;Stair training;Functional mobility training;Therapeutic activities;Therapeutic exercise;Balance  training;Neuromuscular re-education;Cognitive remediation;Patient/family education;Vestibular;Energy conservation;Cryotherapy;Ultrasound;Electrical Stimulation;Fluidtherapy;Iontophoresis 4mg /ml Dexamethasone;Canalith Repostioning;Moist Heat;Traction;Passive range of motion;Manual techniques;Joint Manipulations;Spinal Manipulations;Splinting;Taping;Scar mobilization;Wheelchair mobility training;Prosthetic Training;Orthotic Fit/Training;Dry needling;Visual/perceptual remediation/compensation;Aquatic Therapy    PT Next Visit Plan  higher level balance assessment, FOTO    PT Home Exercise Plan  Access Code:    Consulted and Agree with Plan of Care  Patient;Other (Comment)   boyfriend      Patient will benefit from skilled therapeutic intervention in order to improve the following deficits and impairments:  Decreased activity tolerance, Decreased balance, Decreased cognition, Decreased endurance, Decreased knowledge of use of DME, Decreased mobility, Decreased strength, Impaired perceived functional ability, Postural dysfunction, Abnormal gait, Difficulty walking, Impaired tone, Decreased range of motion, Decreased coordination, Impaired UE functional use, Pain  Visit Diagnosis: Muscle weakness (generalized)  Other abnormalities of gait and mobility  Right middle cerebral artery stroke [facility])     Problem List Patient Active Problem List   Diagnosis Date Noted  . Spastic hemiparesis (HCC)   . Elevated BUN   . Vascular headache   . Mood disorder in conditions classified elsewhere   . 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 Polysubstance abuse (HCC)   . Dyslipidemia   . Ischemic stroke diagnosed during current admission (HCC) 02/13/2019  . MDD (major depressive disorder), recurrent episode, moderate (HCC) 02/10/2019  . Dysphagia 02/09/2019  . Elevated CK 02/09/2019  . CVA (cerebral vascular accident) (HCC) 02/07/2019  . Cellulitis of arm 09/20/2017  . IV drug  abuse (HCC) 09/20/2017  . Quadriceps weakness 06/18/2016  . TBI (traumatic brain injury) (HCC) 05/01/2016  . 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 Respiratory distress   . Glasgow coma scale total score 3-8 (HCC)   . Pneumonia of both lower lobes due to methicillin resistant Staphylococcus aureus (MRSA) (HCC)   .  Acute pulmonary edema (HCC)   . Non-traumatic rhabdomyolysis   . Opioid abuse (HCC)   . Elevated troponin   . Acute respiratory failure with hypoxia (HCC)   . Drug overdose   . Elevated liver enzymes   . Encephalopathy acute   . Encephalopathy 04/19/2016  . Opiate overdose (HCC) 04/19/2016    Amylynn Fano PT, DPT 03/17/2019, 12:57 PM  [place] [facility] MAIN [facility] SERVICES [address] [place], [place], [address] Phone: [phone]   Fax:  [phone]  Name: Jane Watkins MRN: [id number] Date of Birth: 10/01/1995

## 2019-03-19 IMAGING — CR DG CHEST 1V PORT
1 series · 1 of 1 positions shown · non-contrast
Comparison: 04/28/2016.

CLINICAL DATA: Acute respiratory failure.

EXAM:
PORTABLE CHEST 1 VIEW

[AP]
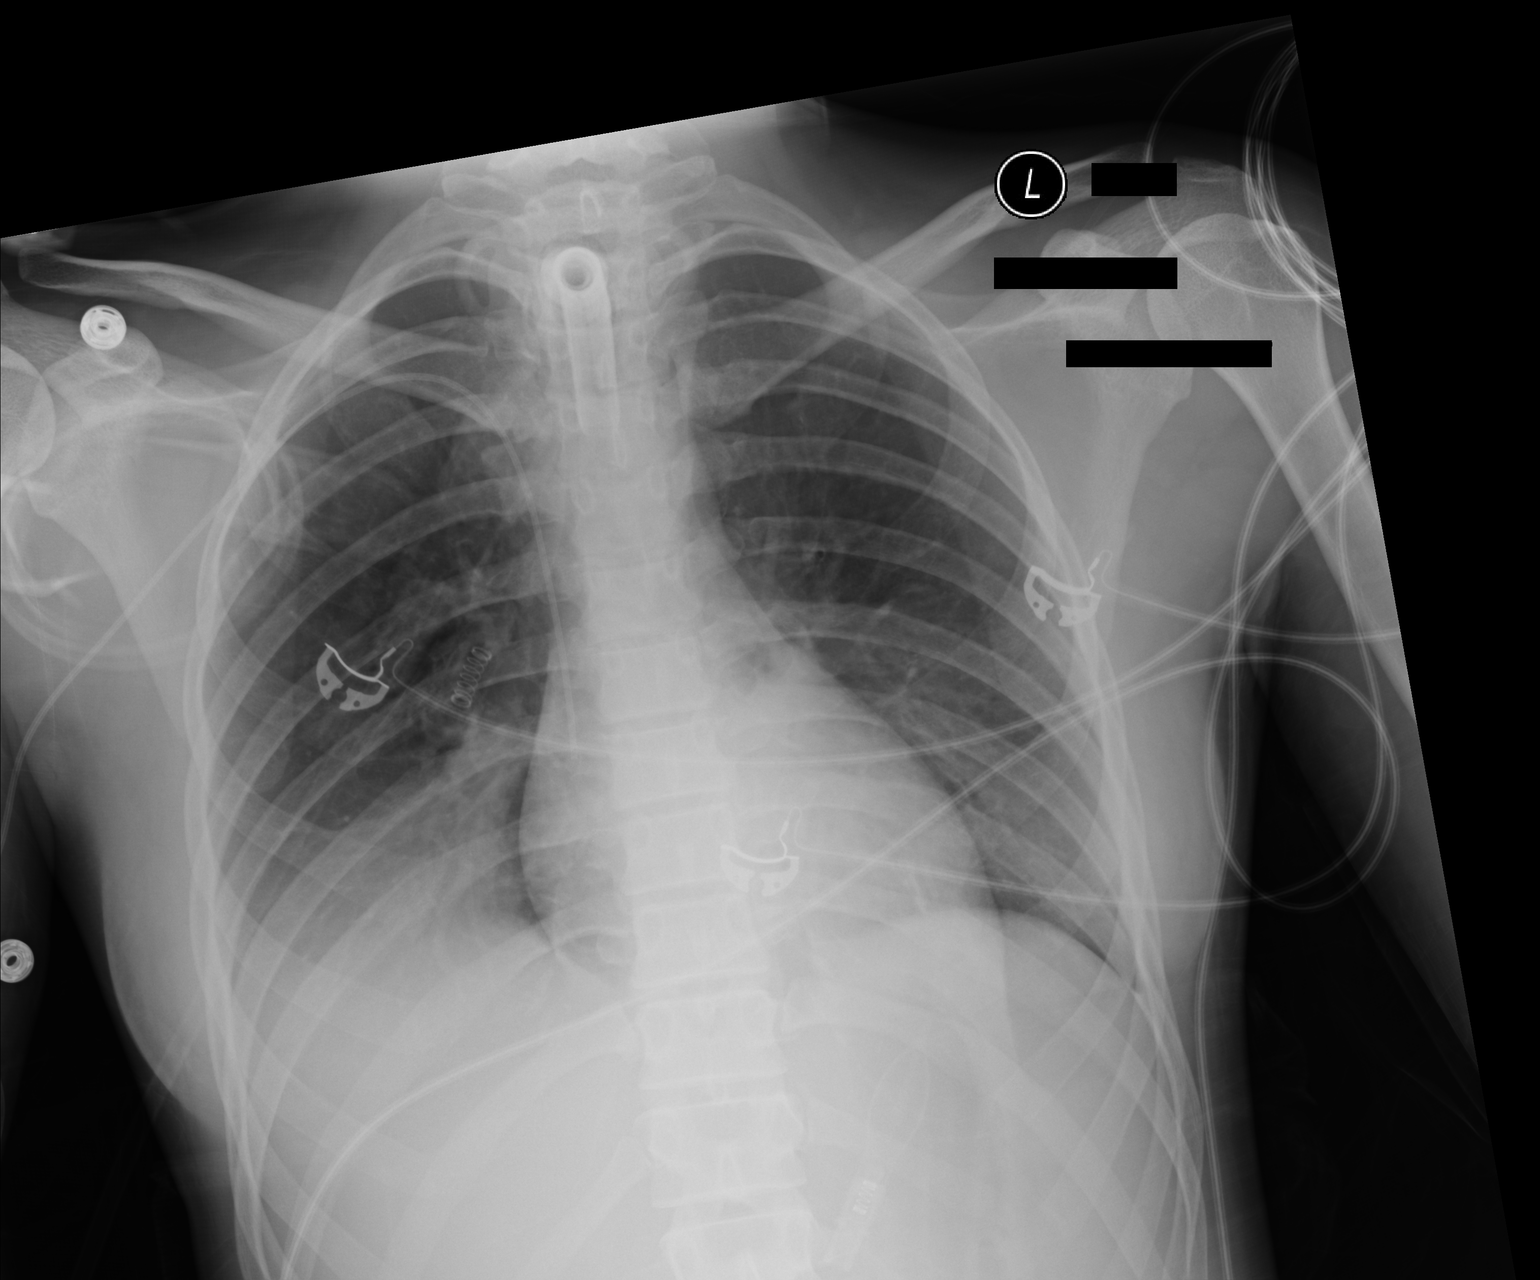

[1 of 1 positions shown; findings below may reference images not displayed]

FINDINGS: Interval extubation with placement of a midline tracheostomy tube.
Right PICC tip projects over the low SVC. Heart size normal. New
bibasilar airspace opacification, right greater than left. No
pleural fluid.
IMPRESSION: New bibasilar airspace opacification, right greater than left, may
be due to atelectasis or aspiration.

## 2019-03-20 IMAGING — DX DG CHEST 1V PORT
1 series · 1 of 1 positions shown · non-contrast
Comparison: 04/28/2016.

CLINICAL DATA: Acute respiratory failure.

EXAM:
PORTABLE CHEST 1 VIEW

[chest ap]
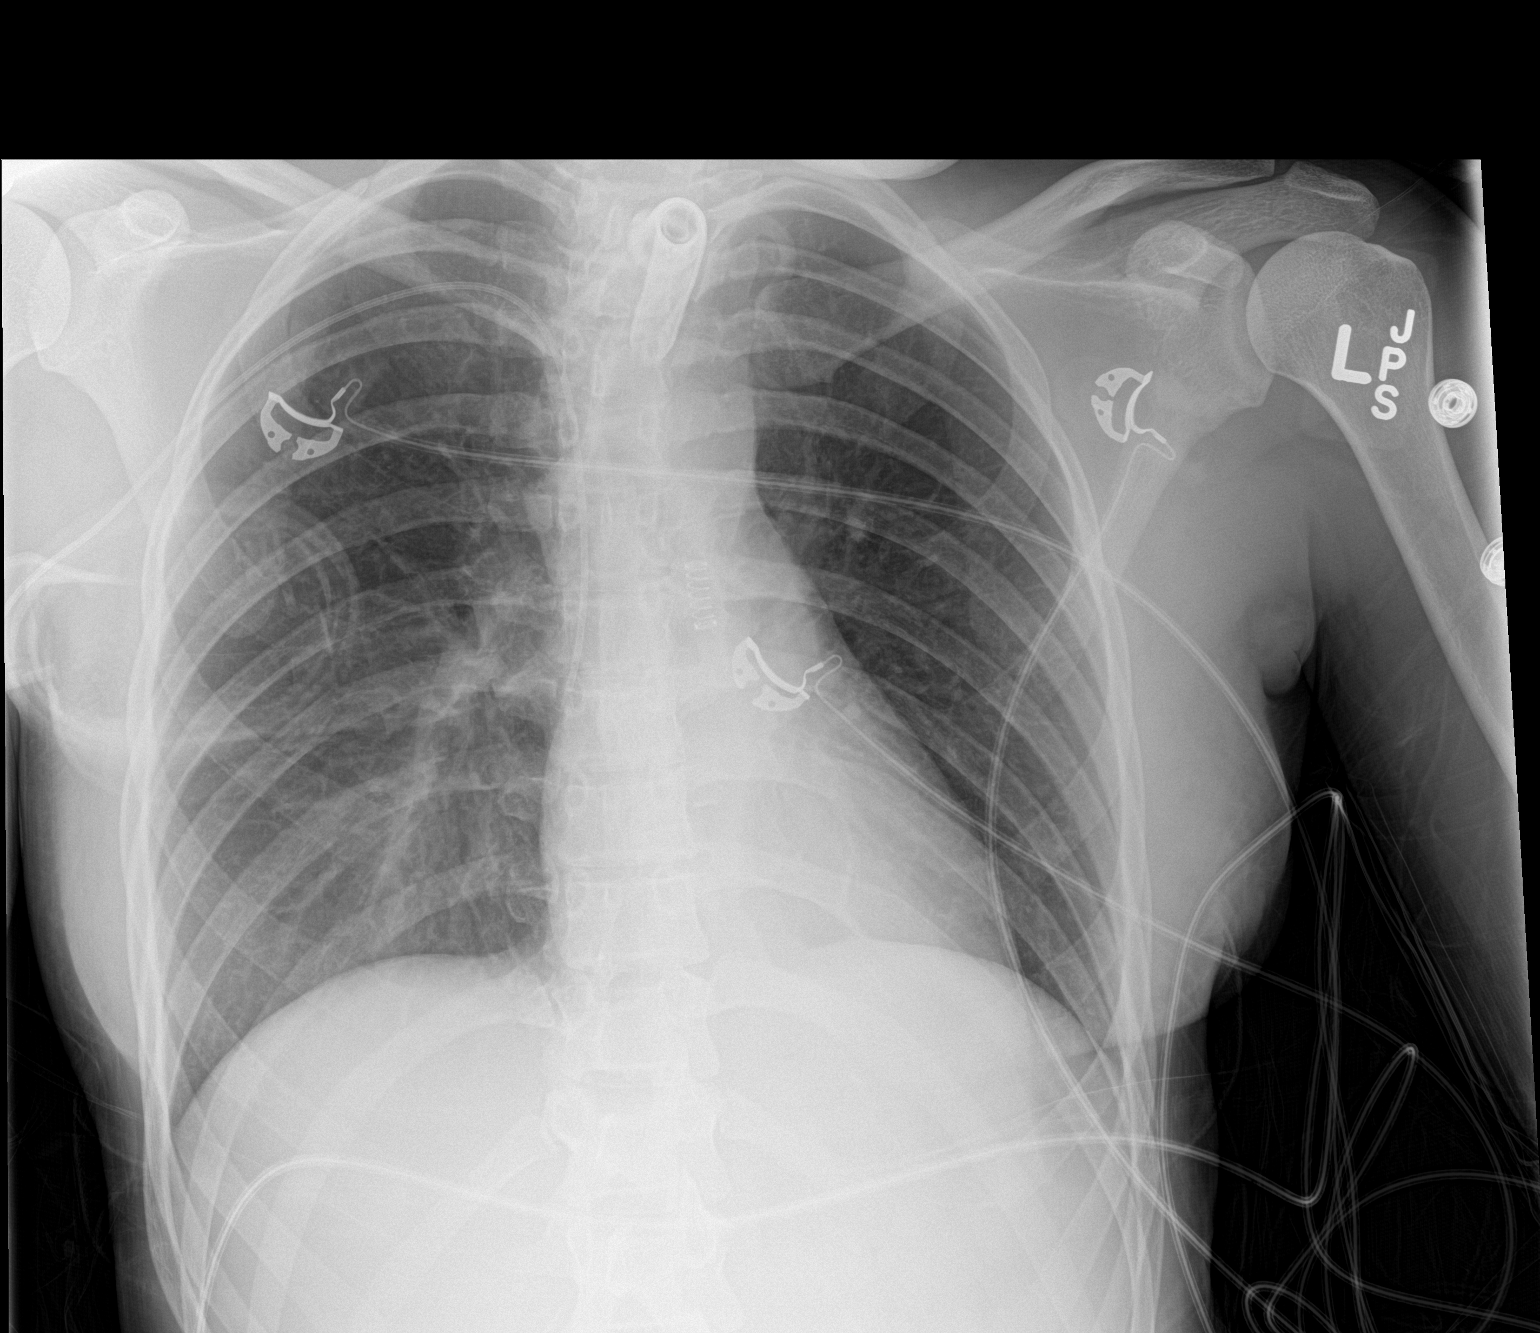

[1 of 1 positions shown; findings below may reference images not displayed]

FINDINGS: Tracheostomy tube and right PICC line stable position. Heart size
normal. Interim near complete clearing of bibasilar atelectasis and
infiltrates. Mild residual in the right base. No pleural effusion or
pneumothorax .
IMPRESSION: 1. Tracheostomy tube and right PICC line stable position.

2. Interim near complete clearing of bibasilar atelectasis and
infiltrates. Mild residual in the right base .

## 2019-03-21 ENCOUNTER — Other Ambulatory Visit: Payer: Self-pay

## 2019-03-21 ENCOUNTER — Ambulatory Visit: Payer: 59 | Admitting: Occupational Therapy

## 2019-03-21 DIAGNOSIS — R278 Other lack of coordination: Secondary | ICD-10-CM

## 2019-03-21 DIAGNOSIS — M6281 Muscle weakness (generalized): Secondary | ICD-10-CM | POA: Diagnosis not present

## 2019-03-21 IMAGING — DX DG CHEST 1V PORT
1 series · 1 of 1 positions shown · non-contrast
Comparison: April 29, 2016

CLINICAL DATA: Respiratory failure.

EXAM:
PORTABLE CHEST 1 VIEW

[chest ap]
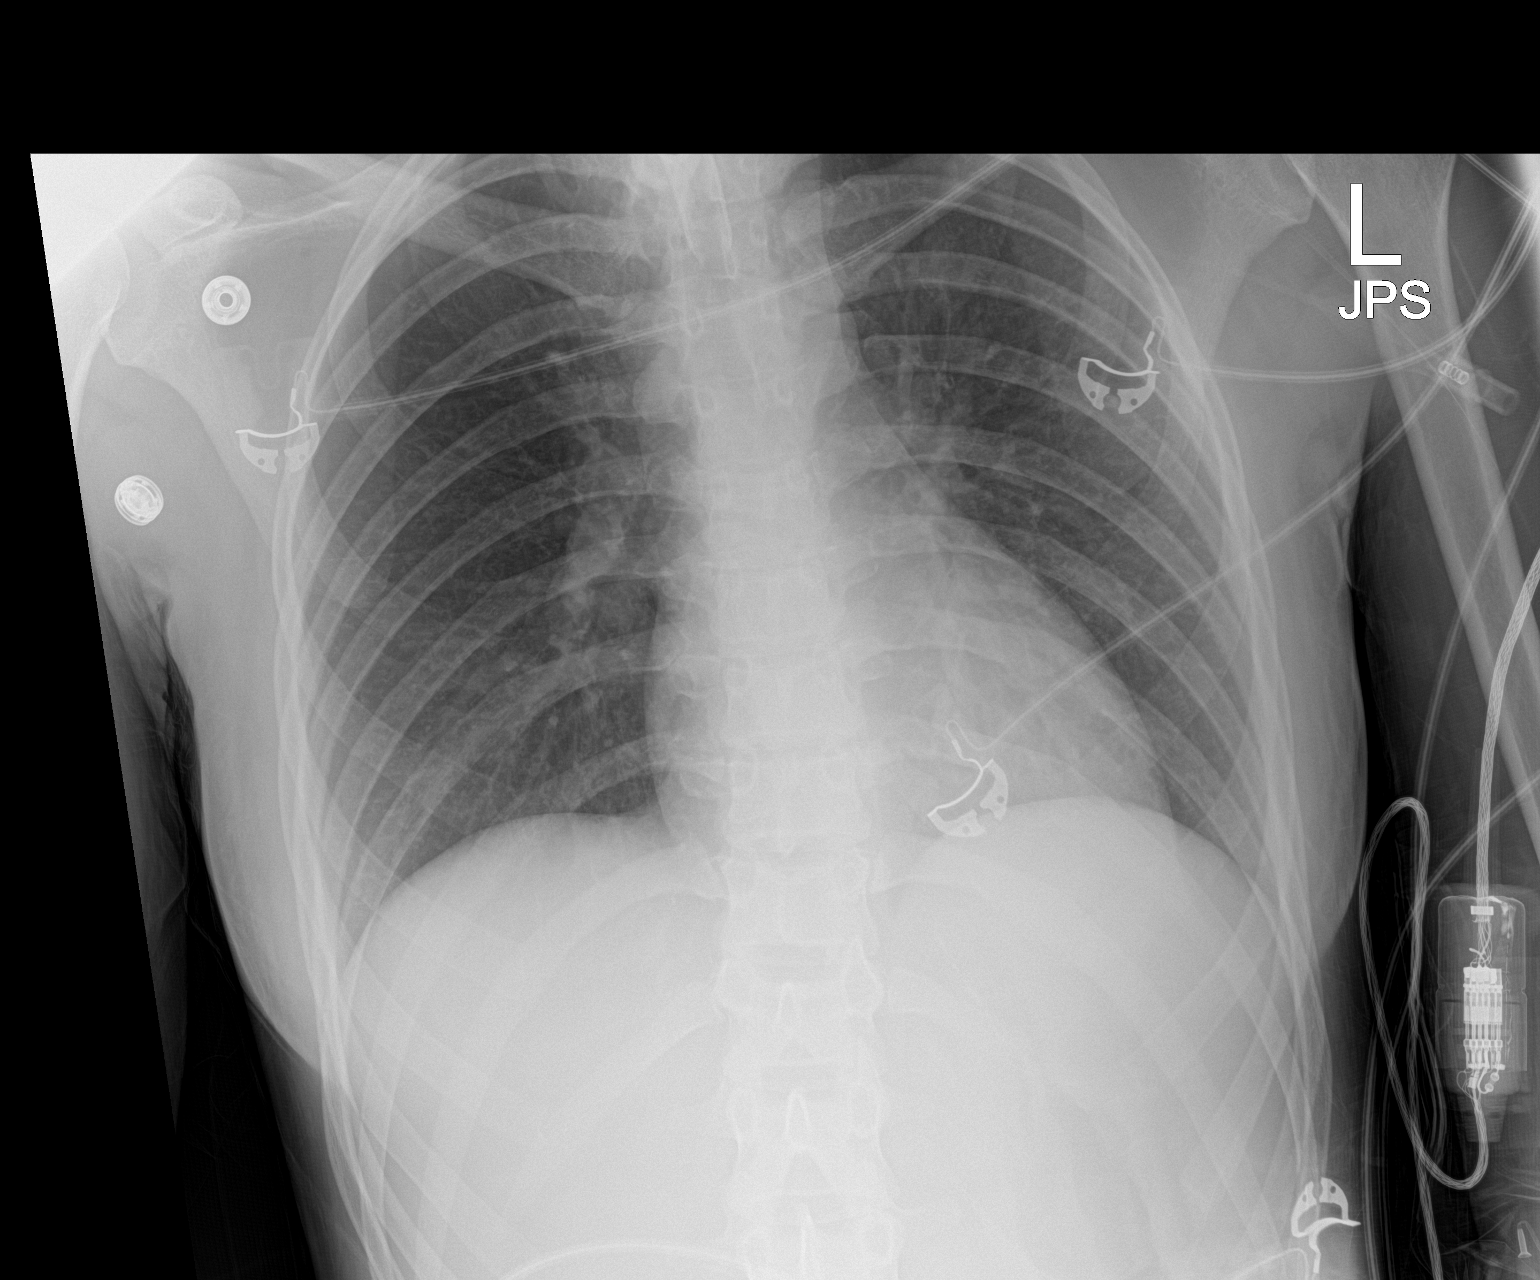

[1 of 1 positions shown; findings below may reference images not displayed]

FINDINGS: Tracheostomy catheter tip is 5.3 cm above the carina. No
pneumothorax. There is no edema or consolidation. Heart size and
pulmonary vascularity are normal. No adenopathy. No bone lesions.
IMPRESSION: Tracheostomy catheter as described without pneumothorax. No edema or
consolidation. Stable cardiac silhouette.

## 2019-03-22 ENCOUNTER — Encounter: Payer: Self-pay | Admitting: Occupational Therapy

## 2019-03-22 NOTE — Therapy (Signed)
[place] MAIN [facility] SERVICES [address] [place], [place], [address] Phone: [phone]   Fax:  [phone]  Occupational Therapy Treatment  Patient Details  Name: Jane Watkins MRN: [id number] Date of Birth: 06-05-1995 Referring Provider (OT): Dr. [name]   Encounter Date: 03/21/2019  OT End of Session - 03/22/19 0934    Visit Number  3    Number of Visits  24    Date for OT Re-Evaluation  05/30/19    Authorization Type  Progress report period starting 03/07/2019    OT Start Time  1014    OT Stop Time  1100    OT Time Calculation (min)  46 min    Activity Tolerance  Patient tolerated treatment well    Behavior During Therapy  Flat affect;WFL for tasks assessed/performed       Past Medical History:  Diagnosis Date  . AKI (acute kidney injury) ([place]) 2018   "from overdose"  . Anxiety   . Daily headache   . Depression   . Drug overdose 04/2016   [name] 05/01/2016  . GERD (gastroesophageal reflux disease)    "when I was younger; gone now" (09/21/2017)  . Migraine    "q couple weeks" (09/21/2017)  . Overdose     Past Surgical History:  Procedure Laterality Date  . APPENDECTOMY  04/2013  . I & D EXTREMITY Left 09/20/2017   Procedure: IRRIGATION AND DEBRIDEMENT LEFT ARM;  Surgeon: [name], MD;  Location: [place];  Service: Orthopedics;  Laterality: Left;  . TEE WITHOUT CARDIOVERSION N/A 02/10/2019   Procedure: TRANSESOPHAGEAL ECHOCARDIOGRAM (TEE);  Surgeon: [name], MD;  Location: [facility];  Service: Cardiovascular;  Laterality: N/A;  Polysubstance abuser  . TONGUE SURGERY  ~ 2007   "related to lisp"  . TRACHEOSTOMY  04/2016   [name] 05/01/2016    There were no vitals filed for this visit.  Subjective Assessment - 03/22/19 0933    Subjective   Patient reports she is doing well, has been trying to use her hand more at home and can tell she is starting to have more movement especially in the shoulder.    Pertinent History   Per chart. Pt. is a 24 y.o.female with a history of IV drug use, polysubstance as well as tobacco abuse, anxiety. Pt. presented to [facility] on 02/07/2019 after being found unresponsive.  EMS contacted patient received Narcan with some improvement in mental status.  Admission labs with WBC 20,700, potassium 5.2, BUN 25, creatinine 0.67, urine drug screen positive cocaine, marijuana and benzodiazepines, alcohol level less than 10, SARS coronavirus negative.  Cranial CT scan showed areas of indistinct low-density in the right basal ganglia with mass-effect on the right lateral ventricle.  Patient did not receive TPA.  MRI showed acute infarction of right MCA territory, affecting the basal ganglia and affecting the cortical and subcortical brain in a largely watershed distribution. Pt. had a previous inpatient rehab admission 05/01/2016 to 05/09/2016 for hypoxic encephalopathy after drug overdose requiring tracheostomy, and was later decannulated. Pt. resides with her boyfriend in an apartment. 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    Patient Stated Goals  Patient reports she would like to be as independent as possible with all her daily tasks.    Currently in Pain?  No/denies    Pain Score  0-No pain    Multiple Pain Sites  No        Patient seen for therapeutic exercise as  follows: PROM to left UE by therapist for all joints and planes followed by AAROM/AROM for shoulder flexion, ABD, ADD, elbow flexion/extension, IR/ER, forearm supination, wrist flexion/extension, finger flexion/extension.   SAEBO looped ball tower to move balls from one level to the next from seated position, able to complete 2 of the 4 levels for reaching patterns, cues to decrease compensatory movements at the shoulder.    Patient performing 1# dowel exercises for shoulder flexion, ABD/ADD, chest press, forwards and backwards circles for one set of 10 reps each.  Cues for form and technique.     Neuromuscular Reeducation:   Focused on finger ROM for attempts at finger ABD/ADD and tapping, therapist taking patient through the motions followed by facilitation of movement with therapist guiding and assisting as needed.  Emphasis on finger extension on left.   Attempts to pick up jumbo pegs and place into grid but has difficulty, able to remove pegs with gross grasping pattern with left hand.        Response to tx: Patient continues to make excellent progress with Left UE with more progress proximal at this point than distal however she is demonstrating increased movement in her fingers for gross grasping and releasing patterns.  Increased effort and focused required for all tasks.  Did not complain of any pain at the shoulder this date.  Patient does tend to compensate with movements at the shoulder, displaying shoulder hiking and requires cues to decrease substitution of movement patterns.  She will work on finger extension as well as isolated finger movements with ADD/ABD and attempts at tapping over next couple days as part of her home program.  Continue OT to increase safety and independence with necessary daily tasks.                  OT Education - 03/22/19 0934    Education Details  Reaching tasks, finger ROM exercises    Person(s) Educated  Patient    Methods  Explanation;Demonstration;Tactile cues;Verbal cues;Handout    Comprehension  Verbalized understanding;Returned demonstration;Need further instruction          OT Long Term Goals - 03/07/19 1814      OT LONG TERM GOAL #1   Title  Pt. will increase active isolated left shoulder flexion by 20 degrees in preparation for combing her hair.    Baseline  Eval: No isolated shoulder shoulder flexion. Pt. compensates with synergystic movement with attempts for flexion.    Time  12    Period  Weeks    Status  New    Target Date  05/30/19      OT LONG TERM GOAL #2   Title  Pt. will increase active shoulder  abduction by 20 degrees in preparation to be able to wash her hair.    Baseline  Eval: Active shoulder abduction 56 degrees.    Time  12    Period  Weeks    Status  New    Target Date  05/30/19      OT LONG TERM GOAL #3   Title  Pt. will improve hand to face patterns using her left hand to be able to independently wash her face.    Baseline  Eval: Pt. is unable to wash her face using her left hand    Time  12    Period  Weeks    Status  New    Target Date  05/30/19      OT LONG TERM GOAL #4  Title  Pt. will increase AROM wrist extension by 10 degrees in preparation for reaching for a cup    Baseline  Eval: 0 degrees of active wrist extension.    Time  12    Period  Weeks    Status  New    Target Date  05/30/19      OT LONG TERM GOAL #5   Title  Pt. will increase left digit flexion to be able to initiate actively holding a toothbrush.    Baseline  Eval: Pt. is unable to use her left hand to hold a toothbrush.    Time  12    Period  Weeks    Target Date  05/30/19            Plan - 03/22/19 0935    Clinical Impression Statement  Patient continues to make excellent progress with Left UE with more progress proximal at this point than distal however she is demonstrating increased movement in her fingers for gross grasping and releasing patterns.  Increased effort and focused required for all tasks.  Did not complain of any pain at the shoulder this date.  Patient does tend to compensate with movements at the shoulder, displaying shoulder hiking and requires cues to decrease substitution of movement patterns.  She will work on finger extension as well as isolated finger movements with ADD/ABD and attempts at tapping over next couple days as part of her home program.  Continue OT to increase safety and independence with necessary daily tasks.    OT Occupational Profile and History  Detailed Assessment- Review of Records and additional review of physical, cognitive, psychosocial  history related to current functional performance    Occupational performance deficits (Please refer to evaluation for details):  ADL's;IADL's    Body Structure / Function / Physical Skills  ADL;IADL;Coordination;Endurance;UE functional use;Decreased knowledge of precautions;Dexterity;FMC;ROM;Tone;Strength    Rehab Potential  Good    Clinical Decision Making  Several treatment options, min-mod task modification necessary    Comorbidities Affecting Occupational Performance:  May have comorbidities impacting occupational performance    Modification or Assistance to Complete Evaluation   Min-Moderate modification of tasks or assist with assess necessary to complete eval    OT Frequency  2x / week    OT Duration  12 weeks    OT Treatment/Interventions  Self-care/ADL training;DME and/or AE instruction;Energy conservation;Therapeutic activities;Patient/family education;Therapeutic exercise;Neuromuscular education    Consulted and Agree with Plan of Care  Patient       Patient will benefit from skilled therapeutic intervention in order to improve the following deficits and impairments:   Body Structure / Function / Physical Skills: ADL, IADL, Coordination, Endurance, UE functional use, Decreased knowledge of precautions, Dexterity, FMC, ROM, Tone, Strength       Visit Diagnosis: Muscle weakness (generalized)  Other lack of coordination    Problem List Patient Active Problem List   Diagnosis Date Noted  . Spastic hemiparesis (HCC)   . Elevated BUN   . Vascular headache   . Mood disorder in conditions classified elsewhere   . 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 Polysubstance abuse (HCC)   . Dyslipidemia   . Ischemic stroke diagnosed during current admission (HCC) 02/13/2019  . MDD (major depressive disorder), recurrent episode, moderate (HCC) 02/10/2019  . Dysphagia 02/09/2019  . Elevated CK 02/09/2019  . CVA (cerebral vascular accident) (HCC) 02/07/2019  .  Cellulitis of arm 09/20/2017  . IV drug abuse (HCC) 09/20/2017  . Quadriceps weakness  06/18/2016  . TBI (traumatic brain injury) (HCC) 05/01/2016  . 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 Respiratory distress   . Glasgow coma scale total score 3-8 (HCC)   . Pneumonia of both lower lobes due to methicillin resistant Staphylococcus aureus (MRSA) (HCC)   . Acute pulmonary edema (HCC)   . Non-traumatic rhabdomyolysis   . Opioid abuse (HCC)   . Elevated troponin   . Acute respiratory failure with hypoxia (HCC)   . Drug overdose   . Elevated liver enzymes   . Encephalopathy acute   . Encephalopathy 04/19/2016  . Opiate overdose (HCC) 04/19/2016   Mellanie Bejarano T Treyvon Blahut, OTR/L, CLT  Ammie Warrick 03/23/2019, 9:30 AM  [place] [facility] MAIN [facility] SERVICES [address] [place], [place], [address] Phone: [phone]   Fax:  [phone]  Name: Jane Watkins MRN: [id number] Date of Birth: 07/05/1995

## 2019-03-24 ENCOUNTER — Ambulatory Visit: Payer: 59 | Admitting: Physical Therapy

## 2019-03-24 ENCOUNTER — Ambulatory Visit: Payer: 59 | Admitting: Occupational Therapy

## 2019-03-24 ENCOUNTER — Other Ambulatory Visit: Payer: Self-pay

## 2019-03-24 ENCOUNTER — Encounter: Payer: Self-pay | Admitting: Physical Therapy

## 2019-03-24 DIAGNOSIS — R278 Other lack of coordination: Secondary | ICD-10-CM

## 2019-03-24 DIAGNOSIS — M6281 Muscle weakness (generalized): Secondary | ICD-10-CM

## 2019-03-24 DIAGNOSIS — M25512 Pain in left shoulder: Secondary | ICD-10-CM

## 2019-03-24 DIAGNOSIS — R41844 Frontal lobe and executive function deficit: Secondary | ICD-10-CM

## 2019-03-24 DIAGNOSIS — I63511 Cerebral infarction due to unspecified occlusion or stenosis of right middle cerebral artery: Secondary | ICD-10-CM

## 2019-03-24 DIAGNOSIS — I639 Cerebral infarction, unspecified: Secondary | ICD-10-CM

## 2019-03-24 DIAGNOSIS — R2689 Other abnormalities of gait and mobility: Secondary | ICD-10-CM

## 2019-03-24 NOTE — Therapy (Signed)
[place] [facility] MAIN [facility] SERVICES [address] [place], [place], [address] Phone: [phone]   Fax:  [phone]  Physical Therapy Treatment  Patient Details  Name: Jane Watkins MRN: [id number] Date of Birth: [birth year] Referring Provider (PT): Dr. Herma Carson. [name]   Encounter Date: 03/24/2019  PT End of Session - 03/24/19 1203    Visit Number  4    Number of Visits  24    Date for PT Re-Evaluation  05/25/19    Authorization - Visit Number  1    PT Start Time  1100    PT Stop Time  1145    PT Time Calculation (min)  45 min    Equipment Utilized During Treatment  Gait belt    Activity Tolerance  Patient tolerated treatment well    Behavior During Therapy  Flat affect       Past Medical History:  Diagnosis Date  . AKI (acute kidney injury) (HCC) 2018   "from overdose"  . Anxiety   . Daily headache   . Depression   . Drug overdose 04/2016   [name] 05/01/2016  . GERD (gastroesophageal reflux disease)    "when I was younger; gone now" (09/21/2017)  . Migraine    "q couple weeks" (09/21/2017)  . Overdose     Past Surgical History:  Procedure Laterality Date  . APPENDECTOMY  04/2013  . I & D EXTREMITY Left 09/20/2017   Procedure: IRRIGATION AND DEBRIDEMENT LEFT ARM;  Surgeon: Bradly Bienenstock, MD;  Location: [facility] OR;  Service: Orthopedics;  Laterality: Left;  . TEE WITHOUT CARDIOVERSION N/A 02/10/2019   Procedure: TRANSESOPHAGEAL ECHOCARDIOGRAM (TEE);  Surgeon: Debbe Odea, MD;  Location: [facility] ORS;  Service: Cardiovascular;  Laterality: N/A;  Polysubstance abuser  . TONGUE SURGERY  ~ 2007   "related to lisp"  . TRACHEOSTOMY  04/2016   [name] 05/01/2016    There were no vitals filed for this visit.  Subjective Assessment - 03/24/19 1200    Subjective  Pt reports doing well; she denies any new falls; She reports adherence with HEP; She reports that her left side is hurting because she slept wrong.    Patient is accompained by:  --    significant other   Pertinent History  Pt is 24 y.o. female presenting to hospital 02/07/19 initially,  MRI showing acute infarct R MCA territory affecting basal ganglia and affecting cortical and subcortical brain in a largely watershed distribution. After in hospital stay patient transitioned to CIR from 02/16/2019-03/01/2019.  PMH includes IV drug abuse, anxiety, depression, migraines, AKI, hypoxic ischemic encephalopathy 2018, opiate overdose 2018, h/o trach 2018, h/o I&D L UE. PLOF: lives with boyfriend in one story apartment, prior to CVA patient was independent, working.    Limitations  Walking;Reading;Lifting;Writing;House hold activities;Standing    How long can you sit comfortably?  NA    How long can you stand comfortably?  20 mins    How long can you walk comfortably?  20 mins    Diagnostic tests  see MRI results    Patient Stated Goals  Patient wants to maximize how much she can walk, get back to normal    Currently in Pain?  Yes    Pain Score  3     Pain Location  Hip    Pain Orientation  Left    Pain Descriptors / Indicators  Aching    Pain Type  Acute pain    Pain Onset  Today    Pain Frequency  Constant    Aggravating Factors   moving    Pain Relieving Factors  rest    Multiple Pain Sites  No       Treatment: Quadriped AI, and RS of trunk x 10 each direction Quadriped LE extension with hold and assist for LLE, and AROM for RLE  X 10  1/2 kneeling with trunk rotation and diagonal chops left and right  and Red therball , 2 lbs  Tall kneeling with trunk rotation left and right with R theraball  Prone left knee flex x 10 , eccentric knee extension with RTB x 10  Prone left hip flex stretch x 30 sec with towel under knee Bridges x 10 with 5 sec hold Single leg bridges with 5 sec hold and opposite leg 6 inches off the mat x 10  PROM to L ankle DF and instruction to boyfriend for HEP   Patient performed with instruction, verbal cues, tactile cues of therapist: goal: increase  tissue extensibility, promote proper posture, improve mobility                      PT Education - 03/24/19 1202    Education provided  Yes    Education Details  stretching in prone    Person(s) Educated  Patient    Methods  Explanation    Comprehension  Verbalized understanding       PT Short Term Goals - 03/02/19 1542      PT SHORT TERM GOAL #1   Title  Pt will be independent with initial HEP in order to indicate improved strength and decreased fall risk.    Baseline  initial HEP administered on eval 03/02/2019    Time  6    Period  Weeks    Status  New    Target Date  04/13/19        PT Long Term Goals - 03/02/19 1543      PT LONG TERM GOAL #1   Title  Pt will be independent with final HEP in order to indicate improved strength and decreased fall risk.    Baseline  initial HEP administered on eval 03/02/19    Time  12    Period  Weeks    Status  New    Target Date  05/25/19      PT LONG TERM GOAL #2   Title  Pt will decrease 5TSTS to less than 10 seconds in order to demonstrate clinically significant improvement in LE strength and functional mobility.    Baseline  03/02/2019: 17 seconds without UE support, dependent on RLE, very little weight shift to LLE    Time  12    Period  Weeks    Status  New    Target Date  05/25/19      PT LONG TERM GOAL #3   Title  Pt will perform 6MWT > 102ft with LRAD at mod I level in order to indicate safe community/leisure negotiation.    Baseline  03/02/2019 527ft    Time  12    Period  Weeks    Status  New    Target Date  05/25/19      PT LONG TERM GOAL #4   Title  The patient will demonstrate at least 1/2 grade improvement in LE MMT grade to improve ability to perform functional activities.    Baseline  see eval for details 03/02/2019    Time  12    Period  Weeks  Status  New    Target Date  05/25/19      PT LONG TERM GOAL #5   Title  Pt will ambulate >1 m/s with LRAD mod I to indicate gait velocity of  community ambulator.    Baseline  self selected: .57 m/s, fastest .27m/s with quad cane    Time  12    Period  Weeks    Status  New    Target Date  05/25/19            Plan - 03/24/19 1203    Clinical Impression Statement  Patient performs exercises in supine, prone and quadriped with assist for prone hip extension.She shows improvement with L knee flex strength and L ankle DF strength 1/5.  She was instructed in prone hip stretching and progressed HEP. She ambulates with loftstrand crutch with MI. She will continue to improve mobility and strengthening.    Personal Factors and Comorbidities  Behavior Pattern    Examination-Activity Limitations  Bathing;Dressing;Sit;Transfers;Sleep;Bed Mobility;Bend;Caring for Others;Carry;Toileting;Reach Overhead;Stand;Locomotion Level;Stairs;Squat;Lift;Hygiene/Grooming    Examination-Participation Restrictions  Church;Interpersonal Relationship;Personal Finances;Yard Work;Cleaning;Laundry;School;Community Activity;Medication Management;Driving;Meal Prep;Shop    Stability/Clinical Decision Making  Evolving/Moderate complexity    Rehab Potential  Good    PT Frequency  2x / week    PT Duration  12 weeks    PT Treatment/Interventions  ADLs/Self Care Home Management;DME Instruction;Gait training;Stair training;Functional mobility training;Therapeutic activities;Therapeutic exercise;Balance training;Neuromuscular re-education;Cognitive remediation;Patient/family education;Vestibular;Energy conservation;Cryotherapy;Ultrasound;Electrical Stimulation;Fluidtherapy;Iontophoresis 4mg /ml Dexamethasone;Canalith Repostioning;Moist Heat;Traction;Passive range of motion;Manual techniques;Joint Manipulations;Spinal Manipulations;Splinting;Taping;Scar mobilization;Wheelchair mobility training;Prosthetic Training;Orthotic Fit/Training;Dry needling;Visual/perceptual remediation/compensation;Aquatic Therapy    PT Next Visit Plan  higher level balance assessment, FOTO    PT Home  Exercise Plan  Access Code:    Consulted and Agree with Plan of Care  Patient;Other (Comment)   boyfriend      Patient will benefit from skilled therapeutic intervention in order to improve the following deficits and impairments:  Decreased activity tolerance, Decreased balance, Decreased cognition, Decreased endurance, Decreased knowledge of use of DME, Decreased mobility, Decreased strength, Impaired perceived functional ability, Postural dysfunction, Abnormal gait, Difficulty walking, Impaired tone, Decreased range of motion, Decreased coordination, Impaired UE functional use, Pain  Visit Diagnosis: Other lack of coordination  Muscle weakness (generalized)  Other abnormalities of gait and mobility  Right middle cerebral artery stroke (HCC)  Acute pain of left shoulder  Ischemic stroke diagnosed during current admission (HCC)  Frontal lobe and executive function deficit     Problem List Patient Active Problem List   Diagnosis Date Noted  . Spastic hemiparesis (HCC)   . Elevated BUN   . Vascular headache   . Mood disorder in conditions classified elsewhere   . 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 Polysubstance abuse (HCC)   . Dyslipidemia   . Ischemic stroke diagnosed during current admission (HCC) 02/13/2019  . MDD (major depressive disorder), recurrent episode, moderate (HCC) 02/10/2019  . Dysphagia 02/09/2019  . Elevated CK 02/09/2019  . CVA (cerebral vascular accident) (HCC) 02/07/2019  . Cellulitis of arm 09/20/2017  . IV drug abuse (HCC) 09/20/2017  . Quadriceps weakness 06/18/2016  . TBI (traumatic brain injury) (HCC) 05/01/2016  . 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 Respiratory distress   . Glasgow coma scale total score 3-8 (HCC)   . Pneumonia of both lower lobes due to methicillin resistant Staphylococcus aureus (MRSA) (HCC)   . Acute pulmonary edema (HCC)   . Non-traumatic rhabdomyolysis   . Opioid abuse (HCC)    . Elevated troponin   .  Acute respiratory failure with hypoxia (HCC)   . Drug overdose   . Elevated liver enzymes   . Encephalopathy acute   . Encephalopathy 04/19/2016  . Opiate overdose (HCC) 04/19/2016    Ezekiel Ina 03/24/2019, 12:07 PM  [place] [facility] MAIN [facility] SERVICES [address] [place], [place], [address] Phone: [phone]   Fax:  [phone]  Name: Jane Watkins MRN: [id number] Date of Birth: 05-Aug-1995

## 2019-03-25 ENCOUNTER — Encounter: Payer: Self-pay | Admitting: Occupational Therapy

## 2019-03-25 NOTE — Therapy (Signed)
[place] [facility] MAIN [facility] SERVICES [address] [place], [place], [address] Phone: [phone]   Fax:  [phone]  Occupational Therapy Treatment  Patient Details  Name: Jane Watkins MRN: [id number] Date of Birth: 03-Sep-1995 Referring Provider (OT): Dr. [name]   Encounter Date: 03/24/2019  OT End of Session - 03/25/19 1312    Visit Number  4    Number of Visits  24    Date for OT Re-Evaluation  05/30/19    Authorization Type  Progress report period starting 03/07/2019    OT Start Time  1145    OT Stop Time  1232    OT Time Calculation (min)  47 min    Activity Tolerance  Patient tolerated treatment well    Behavior During Therapy  Flat affect       Past Medical History:  Diagnosis Date  . AKI (acute kidney injury) (HCC) 2018   "from overdose"  . Anxiety   . Daily headache   . Depression   . Drug overdose 04/2016   [name] 05/01/2016  . GERD (gastroesophageal reflux disease)    "when I was younger; gone now" (09/21/2017)  . Migraine    "q couple weeks" (09/21/2017)  . Overdose     Past Surgical History:  Procedure Laterality Date  . APPENDECTOMY  04/2013  . I & D EXTREMITY Left 09/20/2017   Procedure: IRRIGATION AND DEBRIDEMENT LEFT ARM;  Surgeon: Bradly Bienenstock, MD;  Location: [facility] OR;  Service: Orthopedics;  Laterality: Left;  . TEE WITHOUT CARDIOVERSION N/A 02/10/2019   Procedure: TRANSESOPHAGEAL ECHOCARDIOGRAM (TEE);  Surgeon: Debbe Odea, MD;  Location: [facility] ORS;  Service: Cardiovascular;  Laterality: N/A;  Polysubstance abuser  . TONGUE SURGERY  ~ 2007   "related to lisp"  . TRACHEOSTOMY  04/2016   [name] 05/01/2016    There were no vitals filed for this visit.  Subjective Assessment - 03/25/19 1311    Subjective   "thank you for working me in today."  Patient with her boyfriend who would like to see things she is working on at home so he can help her.    Pertinent History  Per chart. Pt. is a 24 y.o.female with a history  of IV drug use, polysubstance as well as tobacco abuse, anxiety. Pt. presented to [facility] on 02/07/2019 after being found unresponsive.  EMS contacted patient received Narcan with some improvement in mental status.  Admission labs with WBC 20,700, potassium 5.2, BUN 25, creatinine 0.67, urine drug screen positive cocaine, marijuana and benzodiazepines, alcohol level less than 10, SARS coronavirus negative.  Cranial CT scan showed areas of indistinct low-density in the right basal ganglia with mass-effect on the right lateral ventricle.  Patient did not receive TPA.  MRI showed acute infarction of right MCA territory, affecting the basal ganglia and affecting the cortical and subcortical brain in a largely watershed distribution. Pt. had a previous inpatient rehab admission 05/01/2016 to 05/09/2016 for hypoxic encephalopathy after drug overdose requiring tracheostomy, and was later decannulated. Pt. resides with her boyfriend in an apartment. Pt. worked full time as a Production designer, theatre/television/film at [employer]. pt. enjoys travelling.    Patient Stated Goals  Patient reports she would like to be as independent as possible with all her daily tasks.    Currently in Pain?  No/denies    Pain Score  0-No pain    Multiple Pain Sites  No       Patient seen for therapeutic exercise as follows: PROM to left UE  by therapist for all joints and planes followed by AAROM/AROM for shoulder flexion, ABD, ADD, elbow flexion/extension, IR/ER, forearm supination, wrist flexion/extension, finger flexion/extension.    Patient performing 1# dowel exercises for shoulder flexion, ABD/ADD, chest press, forwards and backwards circles for one set of 10 reps each.  Cues for form and technique.    Digit exercises for finger ABD/ADD, tapping with therapist guiding and attempts to facilitate the movement.  Patient picking up velcro squares with and without resistance with left dominant hand , cues and assist as needed.  Patient requires slow deliberate movement  patterns and increased effort to complete.  Worked towards also picking up and placing squares with forward reach, cues for finger extension for release of objects.    Response to tx: Patient continues to make steady gains with left UE facilitation of movement, picking up select items, increased isolated finger movements and reaching patterns to engage in daily tasks.  Continue to work towards improving LUE for use in functional tasks, patient would like to work on handwriting but is unable to hold a pen or pencil with secure grasp even with built up handle yet.                      OT Education - 03/25/19 1311    Education Details  Reaching tasks, finger ROM exercises    Person(s) Educated  Patient    Methods  Explanation;Demonstration;Tactile cues;Verbal cues;Handout    Comprehension  Verbalized understanding;Returned demonstration;Need further instruction          OT Long Term Goals - 03/07/19 1814      OT LONG TERM GOAL #1   Title  Pt. will increase active isolated left shoulder flexion by 20 degrees in preparation for combing her hair.    Baseline  Eval: No isolated shoulder shoulder flexion. Pt. compensates with synergystic movement with attempts for flexion.    Time  12    Period  Weeks    Status  New    Target Date  05/30/19      OT LONG TERM GOAL #2   Title  Pt. will increase active shoulder abduction by 20 degrees in preparation to be able to wash her hair.    Baseline  Eval: Active shoulder abduction 56 degrees.    Time  12    Period  Weeks    Status  New    Target Date  05/30/19      OT LONG TERM GOAL #3   Title  Pt. will improve hand to face patterns using her left hand to be able to independently wash her face.    Baseline  Eval: Pt. is unable to wash her face using her left hand    Time  12    Period  Weeks    Status  New    Target Date  05/30/19      OT LONG TERM GOAL #4   Title  Pt. will increase AROM wrist extension by 10 degrees in  preparation for reaching for a cup    Baseline  Eval: 0 degrees of active wrist extension.    Time  12    Period  Weeks    Status  New    Target Date  05/30/19      OT LONG TERM GOAL #5   Title  Pt. will increase left digit flexion to be able to initiate actively holding a toothbrush.    Baseline  Eval: Pt. is unable  to use her left hand to hold a toothbrush.    Time  12    Period  Weeks    Target Date  05/30/19            Plan - 03/25/19 1312    Clinical Impression Statement  Patient continues to make steady gains with left UE facilitation of movement, picking up select items, increased isolated finger movements and reaching patterns to engage in daily tasks.  Continue to work towards improving LUE for use in functional tasks, patient would like to work on handwriting but is unable to hold a pen or pencil with secure grasp even with built up handle yet.    OT Occupational Profile and History  Detailed Assessment- Review of Records and additional review of physical, cognitive, psychosocial history related to current functional performance    Occupational performance deficits (Please refer to evaluation for details):  ADL's;IADL's    Body Structure / Function / Physical Skills  ADL;IADL;Coordination;Endurance;UE functional use;Decreased knowledge of precautions;Dexterity;FMC;ROM;Tone;Strength    Rehab Potential  Good    Clinical Decision Making  Several treatment options, min-mod task modification necessary    Comorbidities Affecting Occupational Performance:  May have comorbidities impacting occupational performance    Modification or Assistance to Complete Evaluation   Min-Moderate modification of tasks or assist with assess necessary to complete eval    OT Frequency  2x / week    OT Duration  12 weeks    OT Treatment/Interventions  Self-care/ADL training;DME and/or AE instruction;Energy conservation;Therapeutic activities;Patient/family education;Therapeutic exercise;Neuromuscular  education    Consulted and Agree with Plan of Care  Patient       Patient will benefit from skilled therapeutic intervention in order to improve the following deficits and impairments:   Body Structure / Function / Physical Skills: ADL, IADL, Coordination, Endurance, UE functional use, Decreased knowledge of precautions, Dexterity, FMC, ROM, Tone, Strength       Visit Diagnosis: Muscle weakness (generalized)  Other lack of coordination  Right middle cerebral artery stroke [facility])    Problem List Patient Active Problem List   Diagnosis Date Noted  . Spastic hemiparesis (HCC)   . Elevated BUN   . Vascular headache   . Mood disorder in conditions classified elsewhere   . 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 Polysubstance abuse (HCC)   . Dyslipidemia   . Ischemic stroke diagnosed during current admission (HCC) 02/13/2019  . MDD (major depressive disorder), recurrent episode, moderate (HCC) 02/10/2019  . Dysphagia 02/09/2019  . Elevated CK 02/09/2019  . CVA (cerebral vascular accident) (HCC) 02/07/2019  . Cellulitis of arm 09/20/2017  . IV drug abuse (HCC) 09/20/2017  . Quadriceps weakness 06/18/2016  . TBI (traumatic brain injury) (HCC) 05/01/2016  . 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 Respiratory distress   . Glasgow coma scale total score 3-8 (HCC)   . Pneumonia of both lower lobes due to methicillin resistant Staphylococcus aureus (MRSA) (HCC)   . Acute pulmonary edema (HCC)   . Non-traumatic rhabdomyolysis   . Opioid abuse (HCC)   . Elevated troponin   . Acute respiratory failure with hypoxia (HCC)   . Drug overdose   . Elevated liver enzymes   . Encephalopathy acute   . Encephalopathy 04/19/2016  . Opiate overdose [facility]) 04/19/2016   Raunel Dimartino T Zamorah Ailes, OTR/L, CLT  Jametta Moorehead 03/25/2019, 4:41 PM  [place] [facility] MAIN [facility] SERVICES [address] [place], [place], [address] Phone: [phone]   Fax:   [phone]  Name: Jane Watkins MRN: [id number] Date of Birth: 04/20/1995

## 2019-03-29 ENCOUNTER — Encounter: Payer: Self-pay | Admitting: Occupational Therapy

## 2019-03-29 ENCOUNTER — Other Ambulatory Visit: Payer: Self-pay

## 2019-03-29 ENCOUNTER — Ambulatory Visit: Payer: 59 | Admitting: Occupational Therapy

## 2019-03-29 ENCOUNTER — Ambulatory Visit: Payer: 59 | Admitting: Physical Therapy

## 2019-03-29 ENCOUNTER — Other Ambulatory Visit: Payer: Self-pay | Admitting: Physical Medicine and Rehabilitation

## 2019-03-29 DIAGNOSIS — I63511 Cerebral infarction due to unspecified occlusion or stenosis of right middle cerebral artery: Secondary | ICD-10-CM

## 2019-03-29 DIAGNOSIS — R278 Other lack of coordination: Secondary | ICD-10-CM

## 2019-03-29 DIAGNOSIS — M6281 Muscle weakness (generalized): Secondary | ICD-10-CM

## 2019-03-29 DIAGNOSIS — R2689 Other abnormalities of gait and mobility: Secondary | ICD-10-CM

## 2019-03-29 MED ORDER — TIZANIDINE HCL 2 MG PO TABS: 2.0000 mg | ORAL_TABLET | Freq: Two times a day (BID) | ORAL | 0 refills | Status: DC

## 2019-03-29 MED ORDER — TOPIRAMATE 50 MG PO TABS: 75.0000 mg | ORAL_TABLET | Freq: Two times a day (BID) | ORAL | 0 refills | Status: DC

## 2019-03-29 MED ORDER — CLOPIDOGREL BISULFATE 75 MG PO TABS: 75.0000 mg | ORAL_TABLET | Freq: Every day | ORAL | 0 refills | Status: DC

## 2019-03-29 MED ORDER — ATORVASTATIN CALCIUM 80 MG PO TABS: 80.0000 mg | ORAL_TABLET | Freq: Every day | ORAL | 0 refills | Status: DC

## 2019-03-29 MED ORDER — ASPIRIN 81 MG PO CHEW: 81.0000 mg | CHEWABLE_TABLET | Freq: Every day | ORAL | Status: DC

## 2019-03-29 NOTE — Therapy (Signed)
[place] [facility] MAIN [facility] SERVICES [address] [place], [place], [address] Phone: [phone]   Fax:  [phone]  Physical Therapy Treatment  Patient Details  Name: Jane Watkins MRN: [id number] Date of Birth: Mar 20, 1995 Referring Provider (PT): Dr. Herma Carson. Jane Watkins   Encounter Date: 03/29/2019  PT End of Session - 03/29/19 1042    Visit Number  5    Number of Visits  24    Date for PT Re-Evaluation  05/25/19    Authorization - Visit Number  1    PT Start Time  1017    PT Stop Time  1100    PT Time Calculation (min)  43 min    Equipment Utilized During Treatment  Gait belt    Activity Tolerance  Patient tolerated treatment well    Behavior During Therapy  Flat affect       Past Medical History:  Diagnosis Date  . AKI (acute kidney injury) (HCC) 2018   "from overdose"  . Anxiety   . Daily headache   . Depression   . Drug overdose 04/2016   [name] 05/01/2016  . GERD (gastroesophageal reflux disease)    "when I was younger; gone now" (09/21/2017)  . Migraine    "q couple weeks" (09/21/2017)  . Overdose     Past Surgical History:  Procedure Laterality Date  . APPENDECTOMY  04/2013  . I & D EXTREMITY Left 09/20/2017   Procedure: IRRIGATION AND DEBRIDEMENT LEFT ARM;  Surgeon: Bradly Bienenstock, MD;  Location: [facility] OR;  Service: Orthopedics;  Laterality: Left;  . TEE WITHOUT CARDIOVERSION N/A 02/10/2019   Procedure: TRANSESOPHAGEAL ECHOCARDIOGRAM (TEE);  Surgeon: Debbe Odea, MD;  Location: [facility] ORS;  Service: Cardiovascular;  Laterality: N/A;  Polysubstance abuser  . TONGUE SURGERY  ~ 2007   "related to lisp"  . TRACHEOSTOMY  04/2016   [name] 05/01/2016    There were no vitals filed for this visit.  Subjective Assessment - 03/29/19 1037    Subjective  Pt reports doing well; no new falls; Reports increased RLE quad discomfort/sorenes. Reports adherence with HEP    Patient is accompained by:  --   significant other   Pertinent History  Pt is  24 y.o. female presenting to hospital 02/07/19 initially,  MRI showing acute infarct R MCA territory affecting basal ganglia and affecting cortical and subcortical brain in a largely watershed distribution. After in hospital stay patient transitioned to CIR from 02/16/2019-03/01/2019.  PMH includes IV drug abuse, anxiety, depression, migraines, AKI, hypoxic ischemic encephalopathy 2018, opiate overdose 2018, h/o trach 2018, h/o I&D L UE. PLOF: lives with boyfriend in one story apartment, prior to CVA patient was independent, working.    Limitations  Walking;Reading;Lifting;Writing;House hold activities;Standing    How long can you sit comfortably?  NA    How long can you stand comfortably?  20 mins    How long can you walk comfortably?  20 mins    Diagnostic tests  see MRI results    Patient Stated Goals  Patient wants to maximize how much she can walk, get back to normal    Currently in Pain?  Yes    Pain Score  6     Pain Location  Leg    Pain Orientation  Right    Pain Descriptors / Indicators  Aching;Sore    Pain Type  Acute pain    Pain Onset  Today    Pain Frequency  Intermittent    Aggravating Factors   sore/tender  Pain Relieving Factors  rest    Effect of Pain on Daily Activities  no change    Multiple Pain Sites  No              Treatment: Warm up on Crosstrainer, level 1 x4 min (Unbilled);  Patient prone: PT performed passive quad stretch with towel roll under thigh for better stretch 20 sec hold x2 reps bilaterally; -alternate hamstring curl x10 reps bilaterally, partial ROM on LLE with increased repetition due to weakness;   Supine: SLR hip flexion x15 reps each LE with min VCS to keep knees extension for better quad control;  Suspended heel slides x10 reps each LE with min VCs for proper positioning to increase LE strength and mobility; Pt reports increased RLE discomfort during exercise;  Bridge with alternate march 3x5 with mod VCs to keep hips in neutral and  avoid trunk rotation for better stability;   Sidelying: Hip abduction clamshells yellow tband 2x15  Each LE with min VCs for proper positioning and exercise technique;  Pt ambulated x80 feet without AD with LLE AFO, exhibits increased trunk rotation with decreased gluteal stability on LLE during mid stance. Instructed patient in standing hip abduction and reinforced importance of gluteal strengthening exercise for better stance control;   Pt educated throughout session about proper posture and technique with exercises. Improved exercise technique, movement at target joints, use of target muscles after min to mod verbal, visual, tactile cues. She did require CGA and close supervision for safety awareness and min VCs for better trunk control and positioning for balance and safety; Reports mild fatigue at end of session;  She did complain of increased RLE anterior knee/thigh pain, which was worse with knee flexion. Concerned about muscle soreness from advanced exercise. Utilized rolling stick to help reduce stiffness x5 min; Patient tolerated well. She reports less soreness at end of session. Educated patient in using cryotherapy intermittently for better tolerance;                   PT Education - 03/29/19 1042    Education provided  Yes    Education Details  LE strengthening/positioning, HEP    Person(s) Educated  Patient    Methods  Explanation;Verbal cues    Comprehension  Verbalized understanding;Returned demonstration;Verbal cues required;Need further instruction       PT Short Term Goals - 03/02/19 1542      PT SHORT TERM GOAL #1   Title  Pt will be independent with initial HEP in order to indicate improved strength and decreased fall risk.    Baseline  initial HEP administered on eval 03/02/2019    Time  6    Period  Weeks    Status  New    Target Date  04/13/19        PT Long Term Goals - 03/02/19 1543      PT LONG TERM GOAL #1   Title  Pt will be  independent with final HEP in order to indicate improved strength and decreased fall risk.    Baseline  initial HEP administered on eval 03/02/19    Time  12    Period  Weeks    Status  New    Target Date  05/25/19      PT LONG TERM GOAL #2   Title  Pt will decrease 5TSTS to less than 10 seconds in order to demonstrate clinically significant improvement in LE strength and functional mobility.    Baseline  03/02/2019:  17 seconds without UE support, dependent on RLE, very little weight shift to LLE    Time  12    Period  Weeks    Status  New    Target Date  05/25/19      PT LONG TERM GOAL #3   Title  Pt will perform > 1046ft with LRAD at mod I level in order to indicate safe community/leisure negotiation.    Baseline  03/02/2019 570ft    Time  12    Period  Weeks    Status  New    Target Date  05/25/19      PT LONG TERM GOAL #4   Title  The patient will demonstrate at least 1/2 grade improvement in LE MMT grade to improve ability to perform functional activities.    Baseline  see eval for details 03/02/2019    Time  12    Period  Weeks    Status  New    Target Date  05/25/19      PT LONG TERM GOAL #5   Title  Pt will ambulate >1 m/s with LRAD mod I to indicate gait velocity of community ambulator.    Baseline  self selected: .57 m/s, fastest .66m/s with quad cane    Time  12    Period  Weeks    Status  New    Target Date  05/25/19            Plan - 03/29/19 1125    Clinical Impression Statement  Patient was instructed in advanced LE strengthening. Progressed HEP, see patient instructions. She does require min A for qped positioning and min VCs for correct exercise technique. Patient exhibits significant weakness in LLE and LUE which limits overall mobility. reinforced importance of LLE gluteal strengthening for better gait mechanics.  She verbalized and demonstrated understanding of exercise. She would benefit from additional skilled PT Intervention to improve strength,  balance and gait safety;    Personal Factors and Comorbidities  Behavior Pattern    Examination-Activity Limitations  Bathing;Dressing;Sit;Transfers;Sleep;Bed Mobility;Bend;Caring for Others;Carry;Toileting;Reach Overhead;Stand;Locomotion Level;Stairs;Squat;Lift;Hygiene/Grooming    Examination-Participation Restrictions  Church;Interpersonal Relationship;Personal Finances;Yard Work;Cleaning;Laundry;School;Community Activity;Medication Management;Driving;Meal Prep;Shop    Stability/Clinical Decision Making  Evolving/Moderate complexity    Rehab Potential  Good    PT Frequency  2x / week    PT Duration  12 weeks    PT Treatment/Interventions  ADLs/Self Care Home Management;DME Instruction;Gait training;Stair training;Functional mobility training;Therapeutic activities;Therapeutic exercise;Balance training;Neuromuscular re-education;Cognitive remediation;Patient/family education;Vestibular;Energy conservation;Cryotherapy;Ultrasound;Electrical Stimulation;Fluidtherapy;Iontophoresis 4mg /ml Dexamethasone;Canalith Repostioning;Moist Heat;Traction;Passive range of motion;Manual techniques;Joint Manipulations;Spinal Manipulations;Splinting;Taping;Scar mobilization;Wheelchair mobility training;Prosthetic Training;Orthotic Fit/Training;Dry needling;Visual/perceptual remediation/compensation;Aquatic Therapy    PT Next Visit Plan  higher level balance assessment, FOTO    PT Home Exercise Plan  Access Code:    Consulted and Agree with Plan of Care  Patient;Other (Comment)   boyfriend      Patient will benefit from skilled therapeutic intervention in order to improve the following deficits and impairments:  Decreased activity tolerance, Decreased balance, Decreased cognition, Decreased endurance, Decreased knowledge of use of DME, Decreased mobility, Decreased strength, Impaired perceived functional ability, Postural dysfunction, Abnormal gait, Difficulty walking, Impaired tone, Decreased range of  motion, Decreased coordination, Impaired UE functional use, Pain  Visit Diagnosis: Muscle weakness (generalized)  Other lack of coordination  Other abnormalities of gait and mobility     Problem List Patient Active Problem List   Diagnosis Date Noted  . Spastic hemiparesis (HCC)   . Elevated BUN   . Vascular headache   .  Mood disorder in conditions classified elsewhere   . 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 Polysubstance abuse ([place])   . Dyslipidemia   . Ischemic stroke diagnosed during current admission ([place]) 02/13/2019  . MDD (major depressive disorder), recurrent episode, moderate ([place]) 02/10/2019  . Dysphagia 02/09/2019  . Elevated CK 02/09/2019  . CVA (cerebral vascular accident) ([place]) 02/07/2019  . Cellulitis of arm 09/20/2017  . IV drug abuse ([place]) 09/20/2017  . Quadriceps weakness 06/18/2016  . TBI (traumatic brain injury) ([place]) 05/01/2016  . 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 Respiratory distress   . Glasgow coma scale total score 3-8 ([place])   . Pneumonia of both lower lobes due to methicillin resistant Staphylococcus aureus (MRSA) ([place])   . Acute pulmonary edema (HCC)   . Non-traumatic rhabdomyolysis   . Opioid abuse ([place])   . Elevated troponin   . Acute respiratory failure with hypoxia ([place])   . Drug overdose   . Elevated liver enzymes   . Encephalopathy acute   . Encephalopathy 04/19/2016  . Opiate overdose ([place]) 04/19/2016    Cully Luckow PT, DPT 03/29/2019, 11:26 AM  [place] MAIN [facility] SERVICES [address] [place], [place], [address] Phone: [phone]   Fax:  [phone]  Name: Letasha Kershaw MRN: [id number] Date of Birth: 1995-07-11

## 2019-03-29 NOTE — Therapy (Signed)
[place] MAIN [facility] SERVICES [address] [place], [place], [address] Phone: [phone]   Fax:  [phone]  Occupational Therapy Treatment  Patient Details  Name: Jane Watkins MRN: [id number] Date of Birth: November 10, 1995 Referring Provider (OT): Dr. [name]   Encounter Date: 03/29/2019  OT End of Session - 03/29/19 1722    Visit Number  5    Number of Visits  24    Date for OT Re-Evaluation  05/30/19    Authorization Type  Progress report period starting 03/07/2019    OT Start Time  0939    OT Stop Time  1017    OT Time Calculation (min)  38 min    Activity Tolerance  Patient tolerated treatment well    Behavior During Therapy  Flat affect;WFL for tasks assessed/performed       Past Medical History:  Diagnosis Date  . AKI (acute kidney injury) ([place]) 2018   "from overdose"  . Anxiety   . Daily headache   . Depression   . Drug overdose 04/2016   [name] 05/01/2016  . GERD (gastroesophageal reflux disease)    "when I was younger; gone now" (09/21/2017)  . Migraine    "q couple weeks" (09/21/2017)  . Overdose     Past Surgical History:  Procedure Laterality Date  . APPENDECTOMY  04/2013  . I & D EXTREMITY Left 09/20/2017   Procedure: IRRIGATION AND DEBRIDEMENT LEFT ARM;  Surgeon: [name], MD;  Location: [place];  Service: Orthopedics;  Laterality: Left;  . TEE WITHOUT CARDIOVERSION N/A 02/10/2019   Procedure: TRANSESOPHAGEAL ECHOCARDIOGRAM (TEE);  Surgeon: [name], MD;  Location: [facility] ORS;  Service: Cardiovascular;  Laterality: N/A;  Polysubstance abuser  . TONGUE SURGERY  ~ 2007   "related to lisp"  . TRACHEOSTOMY  04/2016   [name] 05/01/2016    There were no vitals filed for this visit.  Subjective Assessment - 03/29/19 1720    Subjective   Patient arrived with her aunt today.  Patient requesting appts on Tues/Thursday if possible.  Discussed how this may be hard to coordinate with PT since she is already scheduled on  Mon and Wed.  Patient lives with boyfriend who works most wednesdays.  Issued information on [employer] services for public transportation options.    Pertinent History  Per chart. Pt. is a 24 y.o.female with a history of IV drug use, polysubstance as well as tobacco abuse, anxiety. Pt. presented to [facility] on 02/07/2019 after being found unresponsive.  EMS contacted patient received Narcan with some improvement in mental status.  Admission labs with WBC 20,700, potassium 5.2, BUN 25, creatinine 0.67, urine drug screen positive cocaine, marijuana and benzodiazepines, alcohol level less than 10, SARS coronavirus negative.  Cranial CT scan showed areas of indistinct low-density in the right basal ganglia with mass-effect on the right lateral ventricle.  Patient did not receive TPA.  MRI showed acute infarction of right MCA territory, affecting the basal ganglia and affecting the cortical and subcortical brain in a largely watershed distribution. Pt. had a previous inpatient rehab admission 05/01/2016 to 05/09/2016 for hypoxic encephalopathy after drug overdose requiring tracheostomy, and was later decannulated. Pt. resides with her boyfriend in an apartment. 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    Patient Stated Goals  Patient reports she would like to be as independent as possible with all her daily tasks.    Currently in Pain?  Yes    Pain Score  6     Pain Location  Leg    Pain Orientation  Right    Pain Descriptors / Indicators  Aching;Sore    Pain Type  Acute pain    Pain Onset  Today    Pain Frequency  Intermittent    Multiple Pain Sites  No       Therapeutic Exercise:   Patient performing 1# dowel exercises for shoulder flexion, ABD/ADD, chest press, forwards and backwards circles for 2 sets of 10 reps each. Cues for form and technique. Patient able to recall most of exercises from last session, she reports she did not have anything to use at home to perform dowel/wand  exercises.    PROM followed by [facility] for Digit exercises for finger ABD/ADD, tapping with therapist guiding and attempts to facilitate the movement.  Patient seen for reaching tasks with use of 4 level shape tower, patient able to complete first 2 levels with cues for grasping pattern on shape, slow deliberate reaching patterns with cues for normal movement and decrease substitution at the shoulder.  Cues for elbow extension on left.    Neuromuscular Reeducation:   Patient seen for picking up and manipulation of small ball pegs, difficulty picking up from container but able to take from therapist and place into grid, cues for prehension patterns.  Some difficulty noted with turning items at times. Able to remove with slow deliberate movements, moderate cues for release of objects.    Response to tx:   Patient continues to make good progress each session.  She is demonstrating improved ROM, reaching patterns and finger/grasping patterns.  She continues to have to focus and think about movements and still has a delay with release of objects at times.  No pain reported in shoulder with reaching tasks today.  Opposition of thumb to index and towards lateral side of middle finger.  Patient able to recall dowel exercises this date and was able to increase to 2 repetitions this date. Continue OT to increase independence in daily tasks.                   OT Education - 03/29/19 1722    Education Details  Reaching tasks, finger ROM exercises    Person(s) Educated  Patient    Methods  Explanation;Demonstration;Tactile cues;Verbal cues;Handout    Comprehension  Verbalized understanding;Returned demonstration;Need further instruction          OT Long Term Goals - 03/07/19 1814      OT LONG TERM GOAL #1   Title  Pt. will increase active isolated left shoulder flexion by 20 degrees in preparation for combing her hair.    Baseline  Eval: No isolated shoulder shoulder flexion. Pt.  compensates with synergystic movement with attempts for flexion.    Time  12    Period  Weeks    Status  New    Target Date  05/30/19      OT LONG TERM GOAL #2   Title  Pt. will increase active shoulder abduction by 20 degrees in preparation to be able to wash her hair.    Baseline  Eval: Active shoulder abduction 56 degrees.    Time  12    Period  Weeks    Status  New    Target Date  05/30/19      OT LONG TERM GOAL #3   Title  Pt. will improve hand to face patterns using her left hand to be able to independently wash her  face.    Baseline  Eval: Pt. is unable to wash her face using her left hand    Time  12    Period  Weeks    Status  New    Target Date  05/30/19      OT LONG TERM GOAL #4   Title  Pt. will increase AROM wrist extension by 10 degrees in preparation for reaching for a cup    Baseline  Eval: 0 degrees of active wrist extension.    Time  12    Period  Weeks    Status  New    Target Date  05/30/19      OT LONG TERM GOAL #5   Title  Pt. will increase left digit flexion to be able to initiate actively holding a toothbrush.    Baseline  Eval: Pt. is unable to use her left hand to hold a toothbrush.    Time  12    Period  Weeks    Target Date  05/30/19            Plan - 03/29/19 1723    Clinical Impression Statement  Patient continues to make good progress each session.  She is demonstrating improved ROM, reaching patterns and finger/grasping patterns.  She continues to have to focus and think about movements and still has a delay with release of objects at times.  No pain reported in shoulder with reaching tasks today.  Opposition of thumb to index and towards lateral side of middle finger.  Patient able to recall dowel exercises this date and was able to increase to 2 repetitions this date. Continue OT to increase independence in daily tasks.    OT Occupational Profile and History  Detailed Assessment- Review of Records and additional review of physical,  cognitive, psychosocial history related to current functional performance    Occupational performance deficits (Please refer to evaluation for details):  ADL's;IADL's    Body Structure / Function / Physical Skills  ADL;IADL;Coordination;Endurance;UE functional use;Decreased knowledge of precautions;Dexterity;FMC;ROM;Tone;Strength    Rehab Potential  Good    Clinical Decision Making  Several treatment options, min-mod task modification necessary    Comorbidities Affecting Occupational Performance:  May have comorbidities impacting occupational performance    Modification or Assistance to Complete Evaluation   Min-Moderate modification of tasks or assist with assess necessary to complete eval    OT Frequency  2x / week    OT Duration  12 weeks    OT Treatment/Interventions  Self-care/ADL training;DME and/or AE instruction;Energy conservation;Therapeutic activities;Patient/family education;Therapeutic exercise;Neuromuscular education    Consulted and Agree with Plan of Care  Patient       Patient will benefit from skilled therapeutic intervention in order to improve the following deficits and impairments:   Body Structure / Function / Physical Skills: ADL, IADL, Coordination, Endurance, UE functional use, Decreased knowledge of precautions, Dexterity, FMC, ROM, Tone, Strength       Visit Diagnosis: Muscle weakness (generalized)  Other lack of coordination  Right middle cerebral artery stroke [facility])    Problem List Patient Active Problem List   Diagnosis Date Noted  . Spastic hemiparesis (HCC)   . Elevated BUN   . Vascular headache   . Mood disorder in conditions classified elsewhere   . 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 Polysubstance abuse (HCC)   . Dyslipidemia   . Ischemic stroke diagnosed during current admission (HCC) 02/13/2019  . MDD (major depressive disorder), recurrent episode, moderate (HCC) 02/10/2019  .  Dysphagia 02/09/2019  . Elevated CK  02/09/2019  . CVA (cerebral vascular accident) (HCC) 02/07/2019  . Cellulitis of arm 09/20/2017  . IV drug abuse (HCC) 09/20/2017  . Quadriceps weakness 06/18/2016  . TBI (traumatic brain injury) (HCC) 05/01/2016  . 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 Respiratory distress   . Glasgow coma scale total score 3-8 (HCC)   . Pneumonia of both lower lobes due to methicillin resistant Staphylococcus aureus (MRSA) (HCC)   . Acute pulmonary edema (HCC)   . Non-traumatic rhabdomyolysis   . Opioid abuse (HCC)   . Elevated troponin   . Acute respiratory failure with hypoxia (HCC)   . Drug overdose   . Elevated liver enzymes   . Encephalopathy acute   . Encephalopathy 04/19/2016  . Opiate overdose [facility]) 04/19/2016   Tieisha Darden T Kamari Buch, OTR/L, CLT  Zubin Pontillo 03/29/2019, 5:39 PM  [place] [facility] MAIN [facility] SERVICES [address] [place], [place], [address] Phone: [phone]   Fax:  [phone]  Name: Jane Watkins MRN: [id number] Date of Birth: Apr 09, 1995

## 2019-03-31 ENCOUNTER — Ambulatory Visit: Payer: 59

## 2019-04-01 ENCOUNTER — Other Ambulatory Visit: Payer: Self-pay | Admitting: Physical Medicine and Rehabilitation

## 2019-04-04 ENCOUNTER — Encounter: Payer: Self-pay | Admitting: Occupational Therapy

## 2019-04-04 ENCOUNTER — Other Ambulatory Visit: Payer: Self-pay

## 2019-04-04 ENCOUNTER — Ambulatory Visit: Payer: 59

## 2019-04-04 ENCOUNTER — Ambulatory Visit: Payer: 59 | Admitting: Occupational Therapy

## 2019-04-04 DIAGNOSIS — I63511 Cerebral infarction due to unspecified occlusion or stenosis of right middle cerebral artery: Secondary | ICD-10-CM

## 2019-04-04 DIAGNOSIS — M6281 Muscle weakness (generalized): Secondary | ICD-10-CM | POA: Diagnosis not present

## 2019-04-04 DIAGNOSIS — R2689 Other abnormalities of gait and mobility: Secondary | ICD-10-CM

## 2019-04-04 DIAGNOSIS — R278 Other lack of coordination: Secondary | ICD-10-CM

## 2019-04-04 NOTE — Therapy (Signed)
[facility] MAIN [facility] SERVICES [address] [place], [place], [address] Phone: [phone]   Fax:  [phone]  Physical Therapy Treatment  Patient Details  Name: Jane Watkins MRN: [id number] Date of Birth: 10-Apr-1995 Referring Provider (PT): Dr. Herma Carson. [name]   Encounter Date: 04/04/2019  PT End of Session - 04/04/19 1347    Visit Number  6    Number of Visits  24    Date for PT Re-Evaluation  05/25/19    Authorization - Visit Number  --    PT Start Time  1345    PT Stop Time  1430    PT Time Calculation (min)  45 min    Equipment Utilized During Treatment  Gait belt    Activity Tolerance  Patient tolerated treatment well    Behavior During Therapy  Flat affect       Past Medical History:  Diagnosis Date  . AKI (acute kidney injury) (HCC) 2018   "from overdose"  . Anxiety   . Daily headache   . Depression   . Drug overdose 04/2016   [name] 05/01/2016  . GERD (gastroesophageal reflux disease)    "when I was younger; gone now" (09/21/2017)  . Migraine    "q couple weeks" (09/21/2017)  . Overdose     Past Surgical History:  Procedure Laterality Date  . APPENDECTOMY  04/2013  . I & D EXTREMITY Left 09/20/2017   Procedure: IRRIGATION AND DEBRIDEMENT LEFT ARM;  Surgeon: Bradly Bienenstock, MD;  Location: [facility] OR;  Service: Orthopedics;  Laterality: Left;  . TEE WITHOUT CARDIOVERSION N/A 02/10/2019   Procedure: TRANSESOPHAGEAL ECHOCARDIOGRAM (TEE);  Surgeon: Debbe Odea, MD;  Location: [facility] ORS;  Service: Cardiovascular;  Laterality: N/A;  Polysubstance abuser  . TONGUE SURGERY  ~ 2007   "related to lisp"  . TRACHEOSTOMY  04/2016   [name] 05/01/2016    There were no vitals filed for this visit.  Subjective Assessment - 04/04/19 1346    Subjective  Pt reports doing well. No new falls. No changes in health/medications reported. No pain upon arrival. Reports adherence with HEP. Father joins patient for therapy session.    Patient is  accompained by:  --   significant other   Pertinent History  Pt is 24 y.o. female presenting to hospital 02/07/19 initially,  MRI showing acute infarct R MCA territory affecting basal ganglia and affecting cortical and subcortical brain in a largely watershed distribution. After in hospital stay patient transitioned to CIR from 02/16/2019-03/01/2019.  PMH includes IV drug abuse, anxiety, depression, migraines, AKI, hypoxic ischemic encephalopathy 2018, opiate overdose 2018, h/o trach 2018, h/o I&D L UE. PLOF: lives with boyfriend in one story apartment, prior to CVA patient was independent, working.    Limitations  Walking;Reading;Lifting;Writing;House hold activities;Standing    How long can you sit comfortably?  NA    How long can you stand comfortably?  20 mins    How long can you walk comfortably?  20 mins    Diagnostic tests  see MRI results    Patient Stated Goals  Patient wants to maximize how much she can walk, get back to normal    Currently in Pain?  No/denies          TREATMENT   Ther-ex  Hooklying clams with manual resistance from therapist x 10; Hooklying adductor squeeze with manual resistance from therapist x 10; Hooklying BLE bridges 3s hold x 10; Hooklying LLE single leg bridges 3s hold x 10; Manually resisted  LLE leg press x 10; R sidelying L hip abduction 2 x 10, assist from therapist to block body to prevent compensation; R sidelying L hip manually resisted extension 2 x 10; Prone L HS curls 2 x 10; Attempted prone straight and bent knee hip extension however pt unable to perform due to weakness; Sit to stand without UE support from mat table with RLE on 6" step and extended 2 x 10, therapist assisting pt to maintain even WB through BLE;   Neuromuscular Re-education  LLE D1 resisted flexion/extension x 10; Tall kneeling dynamic reaching alternating UE with therapist moving targets in different directions/distances and occasionally crossing midline; Quadriped  dynamic reaching with therapist moving targets in different directions/distances and occasionally crossing midline; Quadriped alternating LE extension with hold x 10 bilateral;   Pt educated throughout session about proper posture and technique with exercises. Improved exercise technique, movement at target joints, use of target muscles after min to mod verbal, visual, tactile cues.    Patient demonstrates excellent motivation today during session. She reports having difficulty putting on her L shoe with her AFO and she is unable to tie as well. Pt and father educated about "lock laces" or other elastic laces that she can use on her L shoe to assist with ADL independence. She is able to complete advanced strengthening today. Decreased L hip abduction, extension, and external rotation strength. Left hamstring activation is improving compared to previous session. Pt encouraged to continue her HEP. Pt will benefit from PT services to address deficits in strength, balance, and mobility in order to return to full function at home.                        PT Short Term Goals - 03/02/19 1542      PT SHORT TERM GOAL #1   Title  Pt will be independent with initial HEP in order to indicate improved strength and decreased fall risk.    Baseline  initial HEP administered on eval 03/02/2019    Time  6    Period  Weeks    Status  New    Target Date  04/13/19        PT Long Term Goals - 03/02/19 1543      PT LONG TERM GOAL #1   Title  Pt will be independent with final HEP in order to indicate improved strength and decreased fall risk.    Baseline  initial HEP administered on eval 03/02/19    Time  12    Period  Weeks    Status  New    Target Date  05/25/19      PT LONG TERM GOAL #2   Title  Pt will decrease 5TSTS to less than 10 seconds in order to demonstrate clinically significant improvement in LE strength and functional mobility.    Baseline  03/02/2019: 17 seconds without UE  support, dependent on RLE, very little weight shift to LLE    Time  12    Period  Weeks    Status  New    Target Date  05/25/19      PT LONG TERM GOAL #3   Title  Pt will perform 6MWT > 1035ft with LRAD at mod I level in order to indicate safe community/leisure negotiation.    Baseline  03/02/2019 529ft    Time  12    Period  Weeks    Status  New    Target Date  05/25/19      PT LONG TERM GOAL #4   Title  The patient will demonstrate at least 1/2 grade improvement in LE MMT grade to improve ability to perform functional activities.    Baseline  see eval for details 03/02/2019    Time  12    Period  Weeks    Status  New    Target Date  05/25/19      PT LONG TERM GOAL #5   Title  Pt will ambulate >1 m/s with LRAD mod I to indicate gait velocity of community ambulator.    Baseline  self selected: .57 m/s, fastest .43m/s with quad cane    Time  12    Period  Weeks    Status  New    Target Date  05/25/19            Plan - 04/04/19 1348    Clinical Impression Statement  Patient demonstrates excellent motivation today during session. She reports having difficulty putting on her L shoe with her AFO and she is unable to tie as well. Pt and father educated about "lock laces" or other elastic laces that she can use on her L shoe to assist with ADL independence. She is able to complete advanced strengthening today. Decreased L hip abduction, extension, and external rotation strength. Left hamstring activation is improving compared to previous session. Pt encouraged to continue her HEP. Pt will benefit from PT services to address deficits in strength, balance, and mobility in order to return to full function at home.    Personal Factors and Comorbidities  Behavior Pattern    Examination-Activity Limitations  Bathing;Dressing;Sit;Transfers;Sleep;Bed Mobility;Bend;Caring for Others;Carry;Toileting;Reach Overhead;Stand;Locomotion Level;Stairs;Squat;Lift;Hygiene/Grooming     Examination-Participation Restrictions  Church;Interpersonal Relationship;Personal Finances;Yard Work;Cleaning;Laundry;School;Community Activity;Medication Management;Driving;Meal Prep;Shop    Stability/Clinical Decision Making  Evolving/Moderate complexity    Rehab Potential  Good    PT Frequency  2x / week    PT Duration  12 weeks    PT Treatment/Interventions  ADLs/Self Care Home Management;DME Instruction;Gait training;Stair training;Functional mobility training;Therapeutic activities;Therapeutic exercise;Balance training;Neuromuscular re-education;Cognitive remediation;Patient/family education;Vestibular;Energy conservation;Cryotherapy;Ultrasound;Electrical Stimulation;Fluidtherapy;Iontophoresis 4mg /ml Dexamethasone;Canalith Repostioning;Moist Heat;Traction;Passive range of motion;Manual techniques;Joint Manipulations;Spinal Manipulations;Splinting;Taping;Scar mobilization;Wheelchair mobility training;Prosthetic Training;Orthotic Fit/Training;Dry needling;Visual/perceptual remediation/compensation;Aquatic Therapy    PT Next Visit Plan  higher level balance assessment,    PT Home Exercise Plan  Access Code:    Consulted and Agree with Plan of Care  Patient;Other (Comment)   boyfriend      Patient will benefit from skilled therapeutic intervention in order to improve the following deficits and impairments:  Decreased activity tolerance, Decreased balance, Decreased cognition, Decreased endurance, Decreased knowledge of use of DME, Decreased mobility, Decreased strength, Impaired perceived functional ability, Postural dysfunction, Abnormal gait, Difficulty walking, Impaired tone, Decreased range of motion, Decreased coordination, Impaired UE functional use, Pain  Visit Diagnosis: Muscle weakness (generalized)  Other abnormalities of gait and mobility     Problem List Patient Active Problem List   Diagnosis Date Noted  . Spastic hemiparesis (HCC)   . Elevated BUN   .  Vascular headache   . Mood disorder in conditions classified elsewhere   . 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 Polysubstance abuse (HCC)   . Dyslipidemia   . Ischemic stroke diagnosed during current admission (HCC) 02/13/2019  . MDD (major depressive disorder), recurrent episode, moderate (HCC) 02/10/2019  . Dysphagia 02/09/2019  . Elevated CK 02/09/2019  . CVA (cerebral vascular accident) (HCC) 02/07/2019  . Cellulitis of arm 09/20/2017  .  IV drug abuse (HCC) 09/20/2017  . Quadriceps weakness 06/18/2016  . TBI (traumatic brain injury) (HCC) 05/01/2016  . 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 Respiratory distress   . Glasgow coma scale total score 3-8 (HCC)   . Pneumonia of both lower lobes due to methicillin resistant Staphylococcus aureus (MRSA) (HCC)   . Acute pulmonary edema (HCC)   . Non-traumatic rhabdomyolysis   . Opioid abuse (HCC)   . Elevated troponin   . Acute respiratory failure with hypoxia (HCC)   . Drug overdose   . Elevated liver enzymes   . Encephalopathy acute   . Encephalopathy 04/19/2016  . Opiate overdose (HCC) 04/19/2016   Lynnea Maizes PT, DPT, GCS  Cobie Marcoux 04/05/2019, 8:21 AM  [facility] MAIN [facility] SERVICES [address] [place], [place], [address] Phone: [phone]   Fax:  [phone]  Name: Jane Watkins MRN: [id number] Date of Birth: Oct 14, 1995

## 2019-04-04 NOTE — Therapy (Signed)
[place] MAIN [facility] SERVICES [address] [place], [place], [address] Phone: [phone]   Fax:  [phone]  Occupational Therapy Treatment  Patient Details  Name: Jane Watkins MRN: [id number] Date of Birth: Aug 04, 1995 Referring Provider (OT): Dr. [name]   Encounter Date: 04/04/2019  OT End of Session - 04/04/19 1807    Visit Number  6    Number of Visits  24    Date for OT Re-Evaluation  05/30/19    Authorization Type  Progress report period starting 03/07/2019    OT Start Time  1304    OT Stop Time  1345    OT Time Calculation (min)  41 min    Activity Tolerance  Patient tolerated treatment well    Behavior During Therapy  Flat affect;WFL for tasks assessed/performed       Past Medical History:  Diagnosis Date  . AKI (acute kidney injury) ([place]) 2018   "from overdose"  . Anxiety   . Daily headache   . Depression   . Drug overdose 04/2016   [name] 05/01/2016  . GERD (gastroesophageal reflux disease)    "when I was younger; gone now" (09/21/2017)  . Migraine    "q couple weeks" (09/21/2017)  . Overdose     Past Surgical History:  Procedure Laterality Date  . APPENDECTOMY  04/2013  . I & D EXTREMITY Left 09/20/2017   Procedure: IRRIGATION AND DEBRIDEMENT LEFT ARM;  Surgeon: [name], MD;  Location: [place];  Service: Orthopedics;  Laterality: Left;  . TEE WITHOUT CARDIOVERSION N/A 02/10/2019   Procedure: TRANSESOPHAGEAL ECHOCARDIOGRAM (TEE);  Surgeon: [name], MD;  Location: [facility] ORS;  Service: Cardiovascular;  Laterality: N/A;  Polysubstance abuser  . TONGUE SURGERY  ~ 2007   "related to lisp"  . TRACHEOSTOMY  04/2016   [name] 05/01/2016    There were no vitals filed for this visit.  Subjective Assessment - 04/04/19 1807    Pertinent History  Per chart. Pt. is a 24 y.o.female with a history of IV drug use, polysubstance as well as tobacco abuse, anxiety. Pt. presented to [facility] on 02/07/2019 after being found  unresponsive.  EMS contacted patient received Narcan with some improvement in mental status.  Admission labs with WBC 20,700, potassium 5.2, BUN 25, creatinine 0.67, urine drug screen positive cocaine, marijuana and benzodiazepines, alcohol level less than 10, SARS coronavirus negative.  Cranial CT scan showed areas of indistinct low-density in the right basal ganglia with mass-effect on the right lateral ventricle.  Patient did not receive TPA.  MRI showed acute infarction of right MCA territory, affecting the basal ganglia and affecting the cortical and subcortical brain in a largely watershed distribution. Pt. had a previous inpatient rehab admission 05/01/2016 to 05/09/2016 for hypoxic encephalopathy after drug overdose requiring tracheostomy, and was later decannulated. Pt. resides with her boyfriend in an apartment. 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    Patient Stated Goals  Patient reports she would like to be as independent as possible with all her daily tasks.    Currently in Pain?  No/denies    Pain Score  0-No pain        Therapeutic Exercise: Patient ambulating now into the gym without an assistive device, able to perform sit to supine with modified independence.  Patient seen for ROM and exercises in supine this date.  Patient seen for shoulder flexion on left UE with cues for thumb in up position, able to  perform 138 degrees without pain, increased effort to keep forearm in neutral position.  Shoulder stabilization exercises with place and hold of left arm at 90 degrees of shoulder flexion, added external perturbations and then return from flexion with focus on control of LUE.  ROM with reaching across to opposite shoulder with left hand, top of head and then attempts to interlace hands and place behind the head.  Multiple trials of 5 repetitions each.  ABC exercise with left UE from A-Z with cues for size of movements and form.      Neuromuscular  Reeducation: Focus on manipulation of objects with picking up small square cubes from tabletop and placing into a grid with focus on tip to tip prehension pattern on the left.  Patient with increased tone in left hand and digits.      Response to tx: Patient continues to progress well in all areas.  Working towards improving active ROM of left UE, increased tone in left hand and digits as well as some ataxia noted with reaching and moving through range of motion.  Patient instructed on exercises at home for ROM in supine to improve strength and ROM for daily tasks.  Patient continues to progress with picking up and manipulating small objects and working on improved coordination, dexterity and precision of movements.  Continue OT to maximize safety and independence in necessary daily tasks at home and in the community.                 OT Education - 04/04/19 1807    Education Details  ROM and shoulder stabilization exercises    Person(s) Educated  Patient    Methods  Explanation;Demonstration;Tactile cues;Verbal cues;Handout    Comprehension  Verbalized understanding;Returned demonstration;Need further instruction          OT Long Term Goals - 03/07/19 1814      OT LONG TERM GOAL #1   Title  Pt. will increase active isolated left shoulder flexion by 20 degrees in preparation for combing her hair.    Baseline  Eval: No isolated shoulder shoulder flexion. Pt. compensates with synergystic movement with attempts for flexion.    Time  12    Period  Weeks    Status  New    Target Date  05/30/19      OT LONG TERM GOAL #2   Title  Pt. will increase active shoulder abduction by 20 degrees in preparation to be able to wash her hair.    Baseline  Eval: Active shoulder abduction 56 degrees.    Time  12    Period  Weeks    Status  New    Target Date  05/30/19      OT LONG TERM GOAL #3   Title  Pt. will improve hand to face patterns using her left hand to be able to independently  wash her face.    Baseline  Eval: Pt. is unable to wash her face using her left hand    Time  12    Period  Weeks    Status  New    Target Date  05/30/19      OT LONG TERM GOAL #4   Title  Pt. will increase AROM wrist extension by 10 degrees in preparation for reaching for a cup    Baseline  Eval: 0 degrees of active wrist extension.    Time  12    Period  Weeks    Status  New  Target Date  05/30/19      OT LONG TERM GOAL #5   Title  Pt. will increase left digit flexion to be able to initiate actively holding a toothbrush.    Baseline  Eval: Pt. is unable to use her left hand to hold a toothbrush.    Time  12    Period  Weeks    Target Date  05/30/19            Plan - 04/04/19 1808    Clinical Impression Statement  Patient continues to progress well in all areas.  Working towards improving active ROM of left UE, increased tone in left hand and digits as well as some ataxia noted with reaching and moving through range of motion.  Patient instructed on exercises at home for ROM in supine to improve strength and ROM for daily tasks.  Patient continues to progress with picking up and manipulating small objects and working on improved coordination, dexterity and precision of movements.  Continue OT to maximize safety and independence in necessary daily tasks at home and in the community.    OT Occupational Profile and History  Detailed Assessment- Review of Records and additional review of physical, cognitive, psychosocial history related to current functional performance    Occupational performance deficits (Please refer to evaluation for details):  ADL's;IADL's    Body Structure / Function / Physical Skills  ADL;IADL;Coordination;Endurance;UE functional use;Decreased knowledge of precautions;Dexterity;FMC;ROM;Tone;Strength    Clinical Decision Making  Several treatment options, min-mod task modification necessary    Comorbidities Affecting Occupational Performance:  May have  comorbidities impacting occupational performance    Modification or Assistance to Complete Evaluation   Min-Moderate modification of tasks or assist with assess necessary to complete eval    OT Frequency  2x / week    OT Duration  12 weeks    OT Treatment/Interventions  Self-care/ADL training;DME and/or AE instruction;Energy conservation;Therapeutic activities;Patient/family education;Therapeutic exercise;Neuromuscular education    Consulted and Agree with Plan of Care  Patient       Patient will benefit from skilled therapeutic intervention in order to improve the following deficits and impairments:   Body Structure / Function / Physical Skills: ADL, IADL, Coordination, Endurance, UE functional use, Decreased knowledge of precautions, Dexterity, FMC, ROM, Tone, Strength       Visit Diagnosis: Muscle weakness (generalized)  Other lack of coordination  Right middle cerebral artery stroke [facility])    Problem List Patient Active Problem List   Diagnosis Date Noted  . Spastic hemiparesis (HCC)   . Elevated BUN   . Vascular headache   . Mood disorder in conditions classified elsewhere   . 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 Polysubstance abuse (HCC)   . Dyslipidemia   . Ischemic stroke diagnosed during current admission (HCC) 02/13/2019  . MDD (major depressive disorder), recurrent episode, moderate (HCC) 02/10/2019  . Dysphagia 02/09/2019  . Elevated CK 02/09/2019  . CVA (cerebral vascular accident) (HCC) 02/07/2019  . Cellulitis of arm 09/20/2017  . IV drug abuse (HCC) 09/20/2017  . Quadriceps weakness 06/18/2016  . TBI (traumatic brain injury) (HCC) 05/01/2016  . 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 Respiratory distress   . Glasgow coma scale total score 3-8 (HCC)   . Pneumonia of both lower lobes due to methicillin resistant Staphylococcus aureus (MRSA) (HCC)   . Acute pulmonary edema (HCC)   . Non-traumatic rhabdomyolysis   . Opioid abuse  (HCC)   . Elevated troponin   . Acute respiratory  failure with hypoxia (HCC)   . Drug overdose   . Elevated liver enzymes   . Encephalopathy acute   . Encephalopathy 04/19/2016  . Opiate overdose (HCC) 04/19/2016   Satoru Milich T Millan Legan, OTR/L, CLT  Pamila Mendibles 04/04/2019, 6:22 PM  [facility] MAIN [facility] SERVICES [address] [place], [place], [address] Phone: [phone]   Fax:  [phone]  Name: Jane Watkins MRN: [id number] Date of Birth: 23-Apr-1995

## 2019-04-05 ENCOUNTER — Ambulatory Visit: Payer: 59 | Admitting: Neurology

## 2019-04-06 ENCOUNTER — Telehealth: Payer: Self-pay

## 2019-04-06 NOTE — Telephone Encounter (Signed)
Please schedule pt on March 8 or March 9 per Dr.Sethi when she calls back.

## 2019-04-06 NOTE — Telephone Encounter (Signed)
Revised. 

## 2019-04-06 NOTE — Telephone Encounter (Signed)
I called pts mom that pt appt will have to be r/s due to MD being out of the office after 1200pm tomorrow. She will notify her daughter to give [place] a call back to reschedule the week of March 8-10 2021.

## 2019-04-06 NOTE — Telephone Encounter (Signed)
IF patient calls back pleases schedule her at  DR Pearlean Brownie next available.Provider will be out of the office after 0100pm.   Vm left for patient to call back.

## 2019-04-07 ENCOUNTER — Encounter: Payer: Self-pay | Admitting: Physical Medicine and Rehabilitation

## 2019-04-07 ENCOUNTER — Other Ambulatory Visit: Payer: Self-pay

## 2019-04-07 ENCOUNTER — Encounter: Payer: 59 | Attending: Physical Medicine and Rehabilitation | Admitting: Physical Medicine and Rehabilitation

## 2019-04-07 ENCOUNTER — Ambulatory Visit: Payer: 59 | Admitting: Neurology

## 2019-04-07 VITALS — BP 109/72 | HR 70 | Temp 97.5°F | Ht 62.5 in | Wt 122.0 lb

## 2019-04-07 DIAGNOSIS — G44221 Chronic tension-type headache, intractable: Secondary | ICD-10-CM | POA: Insufficient documentation

## 2019-04-07 DIAGNOSIS — I69354 Hemiplegia and hemiparesis following cerebral infarction affecting left non-dominant side: Secondary | ICD-10-CM

## 2019-04-07 DIAGNOSIS — I63511 Cerebral infarction due to unspecified occlusion or stenosis of right middle cerebral artery: Secondary | ICD-10-CM

## 2019-04-07 DIAGNOSIS — F5102 Adjustment insomnia: Secondary | ICD-10-CM | POA: Diagnosis present

## 2019-04-07 DIAGNOSIS — N3000 Acute cystitis without hematuria: Secondary | ICD-10-CM | POA: Insufficient documentation

## 2019-04-07 DIAGNOSIS — F411 Generalized anxiety disorder: Secondary | ICD-10-CM | POA: Insufficient documentation

## 2019-04-07 DIAGNOSIS — G4701 Insomnia due to medical condition: Secondary | ICD-10-CM

## 2019-04-07 MED ORDER — QUETIAPINE FUMARATE 100 MG PO TABS: 100.0000 mg | ORAL_TABLET | Freq: Every day | ORAL | 1 refills | Status: DC

## 2019-04-07 NOTE — Progress Notes (Addendum)
Subjective:    Patient ID: Jane Watkins, female    DOB: [birth year], 24 y.o.   MRN: [id number]  HPI  Jane Watkins presents for follow-up after admission for R MCA stroke.  She has noted much improved mobility in her L arm! She has been working with outpatient PT and OT and is now able to move arm antigravity. Her therapists noted increased tone in her left thumb and plantar flexors and asked whether Botox would be helpful for her. Her thumb sometimes gets stuck when she is picking up objects and her foot comes out of her shoe more easily because of the plantarflexion, increasing risk of fall.   She has not required the Nicotine patch or Chantix at all!  Her headaches have been well controlled with Topamax 75mg  BID.   She continued to have disrupted sleep at night and requests Seroquel dose be increased.   She has been having mouth pain and needs dental surgery and her dentist wanted clearance before administering lidocaine for pain control.   She also asks about return to driving.    Pain Inventory Average Pain 0 Pain Right Now 0 My pain is na  In the last 24 hours, has pain interfered with the following? General activity 0 Relation with others 0 Enjoyment of life 0 What TIME of day is your pain at its worst? na Sleep (in general) Fair  Pain is worse with: na Pain improves with: na Relief from Meds: 0  Mobility walk without assistance ability to climb steps?  yes do you drive?  no  Function not employed: date last employed . I need assistance with the following:  toileting, meal prep, household duties and shopping  Neuro/Psych weakness spasms depression anxiety  Prior Studies Any changes since last visit?  no  Physicians involved in your care Any changes since last visit?  no   Family History  Problem Relation Age of Onset  . Anemia Father    Social History   Socioeconomic History  . Marital status: Legally Separated    Spouse name: Not on file  .  Number of children: Not on file  . Years of education: Not on file  . Highest education level: Not on file  Occupational History  . Not on file  Tobacco Use  . Smoking status: Current Every Day Smoker    Packs/day: 0.50    Years: 10.00    Pack years: 5.00    Types: Cigarettes  . Smokeless tobacco: Never Used  Substance and Sexual Activity  . Alcohol use: No  . Drug use: Yes    Types: IV, Heroin, Cocaine    Comment: 09/21/2017 "cocaine a couple times in the last month; clean from heroin for 2 wks before yesterday; been struggling w/that for a couple years now""  . Sexual activity: Yes  Other Topics Concern  . Not on file  Social History Narrative  . Not on file   Social Determinants of Health   Financial Resource Strain:   . Difficulty of Paying Living Expenses: Not on file  Food Insecurity:   . Worried About Charity fundraiser in the Last Year: Not on file  . Ran Out of Food in the Last Year: Not on file  Transportation Needs:   . Lack of Transportation (Medical): Not on file  . Lack of Transportation (Non-Medical): Not on file  Physical Activity:   . Days of Exercise per Week: Not on file  . Minutes of Exercise per Session: Not  on file  Stress:   . Feeling of Stress : Not on file  Social Connections:   . Frequency of Communication with Friends and Family: Not on file  . Frequency of Social Gatherings with Friends and Family: Not on file  . Attends Religious Services: Not on file  . Active Member of Clubs or Organizations: Not on file  . Attends Banker Meetings: Not on file  . Marital Status: Not on file   Past Surgical History:  Procedure Laterality Date  . APPENDECTOMY  04/2013  . I & D EXTREMITY Left 09/20/2017   Procedure: IRRIGATION AND DEBRIDEMENT LEFT ARM;  Surgeon: Bradly Bienenstock, MD;  Location: [facility] OR;  Service: Orthopedics;  Laterality: Left;  . TEE WITHOUT CARDIOVERSION N/A 02/10/2019   Procedure: TRANSESOPHAGEAL ECHOCARDIOGRAM (TEE);   Surgeon: Debbe Odea, MD;  Location: [facility] ORS;  Service: Cardiovascular;  Laterality: N/A;  Polysubstance abuser  . TONGUE SURGERY  ~ 2007   "related to lisp"  . TRACHEOSTOMY  04/2016   [name] 05/01/2016   Past Medical History:  Diagnosis Date  . AKI (acute kidney injury) (HCC) 2018   "from overdose"  . Anxiety   . Daily headache   . Depression   . Drug overdose 04/2016   [name] 05/01/2016  . GERD (gastroesophageal reflux disease)    "when I was younger; gone now" (09/21/2017)  . Migraine    "q couple weeks" (09/21/2017)  . Overdose    There were no vitals taken for this visit.  Opioid Risk Score:   Fall Risk Score:  `1  Depression screen PHQ 2/9  No flowsheet data found.   Review of Systems  Constitutional: Negative.   HENT: Negative.   Eyes: Negative.   Respiratory: Negative.   Cardiovascular: Negative.   Gastrointestinal: Negative.   Endocrine: Negative.   Genitourinary: Positive for difficulty urinating.  Musculoskeletal: Positive for gait problem and myalgias.  Skin: Negative.   Allergic/Immunologic: Negative.   Neurological: Positive for weakness.  Hematological: Negative.   Psychiatric/Behavioral: Positive for dysphoric mood. The patient is nervous/anxious.   All other systems reviewed and are negative.      Objective:   Physical Exam Constitutional: No distress . Vital signs reviewed. HEENT: EOMI, oral membranes moist Neck: supple Cardiovascular: RRR without murmur. No JVD  Respiratory: CTA Bilaterally without wheezes or rales. Normal effort  GI: BS +, non-tender, non-distended Skin: Warm and dry. Intact. Psych: Pleasant.  Musc: No edema in extremities. No tenderness in extremities. Neurological: Alert Motor:  LUE: 4/5 throughout.  LLE: HF, KE 5/5, 1/5 ADF  MAS:tr1+ LLE gastroc/trace hamstrings, and LUE elbow flexors and flexor pollicis longus.      Assessment & Plan:  1. 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 Advised that she perform HEP daily on days she does not have therapy.             Mobility in LUE is much improved!             She has been able to walk 20-30 min daily.   2. Pain Management: Headaches, now well controlled Continue Topamax to 75 mg twice daily             Currently taking Tizanidine 2mg  daily for  spasticity. Advised that she only take this as needed and take it at night preferably rather than daytime as can make her sleepy.   3. Insomnia Increased Seroquel to 100mg  given continued difficulty sleeping at night. Was on 150mg  prior.   4. Depression: -Continue prozac 40mg  for depression  5. Polysubstance abuse as well as tobacco abuse.  No longer requiring Chantix or Nicotine patches.   6. Hyperlipidemia. Continue Lipitor  7. Anxiety: Continue Buspar 5mg  BID.   8. Foul smelling urine: Ordered UA/UC. Will call with results tomorrow.   9. Spasticity of left flexor pollicis longus and left soleus and gastrocnemius as follows: Left flexor pollicis longus: Left soleus: 40U Left gastrocnemius: 50U Left flexor pollicis longus: 10U  10. Dental work: Patient should be fine receiving Lidocaine numbing medication for dental work. Reviewed her Echo which was within normal limits.  11. Provided return to driving guidelines for when she returns to driving in the future.   RETURN TO DRIVING PLAN:  WITH THE SUPERVISION OF A LICENSED DRIVER, PLEASE DRIVE IN AN EMPTY PARKING LOT FOR AT LEAST 2-3 TRIALS TO TEST REACTION TIME, VISION, USE OF EQUIPMENT IN CAR, ETC.  IF SUCCESSFUL WITH THE PARKING LOT DRIVING, PROCEED TO SUPERVISED DRIVING TRIALS IN YOUR NEIGHBORHOOD STREETS AT LOW TRAFFIC TIMES TO TEST OBSERVATION  TO TRAFFIC SIGNALS, REACTION TIME, ETC. PLEASE ATTEMPT AT LEAST 2-3 TRIALS IN YOUR NEIGHBORHOOD.  IF NEIGHBORHOOD DRIVING IS SUCCESSFUL, YOU MAY PROCEED TO DRIVING IN BUSIER AREAS IN YOUR COMMUNITY WITH SUPERVISION OF A LICENSED DRIVER. PLEASE ATTEMPT AT LEAST 4-5 TRIALS.  IF COMMUNITY DRIVING IS SUCCESSFUL, YOU MAY PROCEED TO DRIVING ALONE, DURING THE DAY TIME, IN NON-PEAK TRAFFIC TIMES. YOU SHOULD DRIVE NO FURTHER THAN 10 MINUTES IN ONE DIRECTION. PLEASE DO NOT DRIVE IF YOU FEEL FATIGUED OR UNDER THE INFLUENCE OF MEDICATION.   Addendum: 3/1: UC+ for UTI. Sensitive to Nitrofurantoin. Sent medication to pharmacy and called patient to inform her.

## 2019-04-08 LAB — URINALYSIS, ROUTINE W REFLEX MICROSCOPIC
Bilirubin, UA: NEGATIVE
Glucose, UA: NEGATIVE
Ketones, UA: NEGATIVE
Leukocytes,UA: NEGATIVE
Nitrite, UA: NEGATIVE
Protein,UA: NEGATIVE
RBC, UA: NEGATIVE
Specific Gravity, UA: 1.025 (ref 1.005–1.030)
Urobilinogen, Ur: 1 mg/dL (ref 0.2–1.0)
pH, UA: 6 (ref 5.0–7.5)

## 2019-04-09 LAB — URINE CULTURE

## 2019-04-11 ENCOUNTER — Encounter: Payer: Self-pay | Admitting: Occupational Therapy

## 2019-04-11 ENCOUNTER — Other Ambulatory Visit: Payer: Self-pay

## 2019-04-11 ENCOUNTER — Ambulatory Visit: Payer: 59 | Attending: Physical Medicine & Rehabilitation | Admitting: Occupational Therapy

## 2019-04-11 ENCOUNTER — Ambulatory Visit: Payer: 59

## 2019-04-11 DIAGNOSIS — R278 Other lack of coordination: Secondary | ICD-10-CM | POA: Diagnosis present

## 2019-04-11 DIAGNOSIS — R2689 Other abnormalities of gait and mobility: Secondary | ICD-10-CM | POA: Diagnosis present

## 2019-04-11 DIAGNOSIS — I63511 Cerebral infarction due to unspecified occlusion or stenosis of right middle cerebral artery: Secondary | ICD-10-CM | POA: Insufficient documentation

## 2019-04-11 DIAGNOSIS — R41844 Frontal lobe and executive function deficit: Secondary | ICD-10-CM | POA: Insufficient documentation

## 2019-04-11 DIAGNOSIS — M6281 Muscle weakness (generalized): Secondary | ICD-10-CM

## 2019-04-11 DIAGNOSIS — M25512 Pain in left shoulder: Secondary | ICD-10-CM | POA: Diagnosis present

## 2019-04-11 DIAGNOSIS — I639 Cerebral infarction, unspecified: Secondary | ICD-10-CM | POA: Insufficient documentation

## 2019-04-11 MED ORDER — NITROFURANTOIN MONOHYD MACRO 100 MG PO CAPS: 100.0000 mg | ORAL_CAPSULE | Freq: Two times a day (BID) | ORAL | 0 refills | Status: DC

## 2019-04-11 NOTE — Therapy (Addendum)
[facility] MAIN [facility] SERVICES [address] [place], [place], [address] Phone: [phone]   Fax:  [phone]  Occupational Therapy Treatment  Patient Details  Name: Jane Watkins MRN: [id number] Date of Birth: 1995/08/14 Referring Provider (OT): Dr. [name]   Encounter Date: 04/11/2019  OT End of Session - 04/11/19 2251    Visit Number  7    Number of Visits  24    Date for OT Re-Evaluation  05/30/19    Authorization Type  Progress report period starting 03/07/2019    OT Start Time  1524    OT Stop Time  1600    OT Time Calculation (min)  36 min    Activity Tolerance  Patient tolerated treatment well    Behavior During Therapy  Flat affect       Past Medical History:  Diagnosis Date  . AKI (acute kidney injury) (HCC) 2018   "from overdose"  . Anxiety   . Daily headache   . Depression   . Drug overdose 04/2016   [name] 05/01/2016  . GERD (gastroesophageal reflux disease)    "when I was younger; gone now" (09/21/2017)  . Migraine    "q couple weeks" (09/21/2017)  . Overdose     Past Surgical History:  Procedure Laterality Date  . APPENDECTOMY  04/2013  . I & D EXTREMITY Left 09/20/2017   Procedure: IRRIGATION AND DEBRIDEMENT LEFT ARM;  Surgeon: Bradly Bienenstock, MD;  Location: [facility] OR;  Service: Orthopedics;  Laterality: Left;  . TEE WITHOUT CARDIOVERSION N/A 02/10/2019   Procedure: TRANSESOPHAGEAL ECHOCARDIOGRAM (TEE);  Surgeon: Debbe Odea, MD;  Location: [facility] ORS;  Service: Cardiovascular;  Laterality: N/A;  Polysubstance abuser  . TONGUE SURGERY  ~ 2007   "related to lisp"  . TRACHEOSTOMY  04/2016   [name] 05/01/2016    There were no vitals filed for this visit.  Subjective Assessment - 04/11/19 2251    Subjective  Pt. was accompanied by her boyfriend to therapy today.   Pertinent History  Per chart. Pt. is a 24 y.o.female with a history of IV drug use, polysubstance as well as tobacco abuse, anxiety. Pt. presented to [facility] on  02/07/2019 after being found unresponsive.  EMS contacted patient received Narcan with some improvement in mental status.  Admission labs with WBC 20,700, potassium 5.2, BUN 25, creatinine 0.67, urine drug screen positive cocaine, marijuana and benzodiazepines, alcohol level less than 10, SARS coronavirus negative.  Cranial CT scan showed areas of indistinct low-density in the right basal ganglia with mass-effect on the right lateral ventricle.  Patient did not receive TPA.  MRI showed acute infarction of right MCA territory, affecting the basal ganglia and affecting the cortical and subcortical brain in a largely watershed distribution. Pt. had a previous inpatient rehab admission 05/01/2016 to 05/09/2016 for hypoxic encephalopathy after drug overdose requiring tracheostomy, and was later decannulated. Pt. resides with her boyfriend in an apartment. Pt. worked full time as a Production designer, theatre/television/film at [employer]. pt. enjoys travelling.    Patient Stated Goals  Patient reports she would like to be as independent as possible with all her daily tasks.    Currently in Pain?  No/denies      OTTREATMENT    Therapeutic Exercise:  Pt. worked on shoulder stabilization exercises in supine with her shoulder in 90 degrees, and elbow in extension. Pt. required cues to maintain the left elbow in full extension. Pt. worked on protraction, circular motion, and formulating the alphabet. Pt. performed 2 reps  each. Pt. was upgraded to, and provided with pink theraputty, and performed gross gripping, lateral pinch, and 3pt. pinch strengthening. Pt. Was encourage to perform reps of active digit extension between the theraputty ex.   Pt. continues to make steady progress. Pt. expressed concern about the flexor tone in her left thumb which is causing her thumb nail to press into the side of her 2nd digit. Pt. education was provided about positioning of the hand when at rest, as well as opportunities for engaging her hand during daily tasks to  increase neuroplasticity, and motor relearning. Pt. was provided with a foam roll. Pt. plans to inquire about Botox for her thumb at her follwo-up Physician appointment. Discussed elastic shoelaces with the pt., and plan to attempt them next visit.                            OT Long Term Goals - 03/07/19 1814      OT LONG TERM GOAL #1   Title  Pt. will increase active isolated left shoulder flexion by 20 degrees in preparation for combing her hair.    Baseline  Eval: No isolated shoulder shoulder flexion. Pt. compensates with synergystic movement with attempts for flexion.    Time  12    Period  Weeks    Status  New    Target Date  05/30/19      OT LONG TERM GOAL #2   Title  Pt. will increase active shoulder abduction by 20 degrees in preparation to be able to wash her hair.    Baseline  Eval: Active shoulder abduction 56 degrees.    Time  12    Period  Weeks    Status  New    Target Date  05/30/19      OT LONG TERM GOAL #3   Title  Pt. will improve hand to face patterns using her left hand to be able to independently wash her face.    Baseline  Eval: Pt. is unable to wash her face using her left hand    Time  12    Period  Weeks    Status  New    Target Date  05/30/19      OT LONG TERM GOAL #4   Title  Pt. will increase AROM wrist extension by 10 degrees in preparation for reaching for a cup    Baseline  Eval: 0 degrees of active wrist extension.    Time  12    Period  Weeks    Status  New    Target Date  05/30/19      OT LONG TERM GOAL #5   Title  Pt. will increase left digit flexion to be able to initiate actively holding a toothbrush.    Baseline  Eval: Pt. is unable to use her left hand to hold a toothbrush.    Time  12    Period  Weeks    Target Date  05/30/19            Plan - 04/11/19 2252    Clinical Impression Statement  Pt. continues to make steady progress. Pt. expressed concern about the flexor tone in her left thumb which is  causing her thumb nail to press into the side of her 2nd digit. Pt. education was provided about positioning of the hand when at rest, as well as opportunities for engaging her hand during daily tasks to increase neuroplasticity, and  motor relearning. Pt. was provided with a foam roll. Pt. plans to inquire about Botox for her thumb at her follwo-up Physician appointment. Discussed elastic shoelaces with the pt., and plan to attempt them next visit.    OT Occupational Profile and History  Detailed Assessment- Review of Records and additional review of physical, cognitive, psychosocial history related to current functional performance    Occupational performance deficits (Please refer to evaluation for details):  ADL's;IADL's    Body Structure / Function / Physical Skills  ADL;IADL;Coordination;Endurance;UE functional use;Decreased knowledge of precautions;Dexterity;FMC;ROM;Tone;Strength    Rehab Potential  Good    Clinical Decision Making  Several treatment options, min-mod task modification necessary    Comorbidities Affecting Occupational Performance:  May have comorbidities impacting occupational performance    Modification or Assistance to Complete Evaluation   Min-Moderate modification of tasks or assist with assess necessary to complete eval    OT Frequency  2x / week    OT Duration  12 weeks    OT Treatment/Interventions  Self-care/ADL training;DME and/or AE instruction;Energy conservation;Therapeutic activities;Patient/family education;Therapeutic exercise;Neuromuscular education    Consulted and Agree with Plan of Care  Patient       Patient will benefit from skilled therapeutic intervention in order to improve the following deficits and impairments:   Body Structure / Function / Physical Skills: ADL, IADL, Coordination, Endurance, UE functional use, Decreased knowledge of precautions, Dexterity, FMC, ROM, Tone, Strength       Visit Diagnosis: Muscle weakness  (generalized)    Problem List Patient Active Problem List   Diagnosis Date Noted  . Spastic hemiparesis (HCC)   . Elevated BUN   . Vascular headache   . Mood disorder in conditions classified elsewhere   . 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 Polysubstance abuse (HCC)   . Dyslipidemia   . Ischemic stroke diagnosed during current admission (HCC) 02/13/2019  . MDD (major depressive disorder), recurrent episode, moderate (HCC) 02/10/2019  . Dysphagia 02/09/2019  . Elevated CK 02/09/2019  . CVA (cerebral vascular accident) (HCC) 02/07/2019  . Cellulitis of arm 09/20/2017  . IV drug abuse (HCC) 09/20/2017  . Quadriceps weakness 06/18/2016  . TBI (traumatic brain injury) (HCC) 05/01/2016  . 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 Respiratory distress   . Glasgow coma scale total score 3-8 (HCC)   . Pneumonia of both lower lobes due to methicillin resistant Staphylococcus aureus (MRSA) (HCC)   . Acute pulmonary edema (HCC)   . Non-traumatic rhabdomyolysis   . Opioid abuse (HCC)   . Elevated troponin   . Acute respiratory failure with hypoxia (HCC)   . Drug overdose   . Elevated liver enzymes   . Encephalopathy acute   . Encephalopathy 04/19/2016  . Opiate overdose (HCC) 04/19/2016    Olegario Messier, MS, OTR/L 04/11/2019, 11:03 PM  [facility] MAIN [facility] SERVICES [address] [place], [place], [address] Phone: [phone]   Fax:  [phone]  Name: Jane Watkins MRN: [id number] Date of Birth: March 27, 1995

## 2019-04-11 NOTE — Addendum Note (Signed)
Addended by: Horton Chin on: 04/11/2019 03:45 PM   Modules accepted: Orders

## 2019-04-12 NOTE — Therapy (Signed)
[place] MAIN [facility] SERVICES [address] [place], [place], [address] Phone: [phone]   Fax:  [phone]  Physical Therapy Treatment  Patient Details  Name: Jane Watkins MRN: [id number] Date of Birth: [birth year] Referring Provider (PT): Dr. Herma Carson. [name]   Encounter Date: 04/11/2019  PT End of Session - 04/11/19 1619    Visit Number  7    Number of Visits  24    Date for PT Re-Evaluation  05/25/19    PT Start Time  1600    PT Stop Time  1645    PT Time Calculation (min)  45 min    Equipment Utilized During Treatment  Gait belt    Activity Tolerance  Patient tolerated treatment well    Behavior During Therapy  Flat affect       Past Medical History:  Diagnosis Date  . AKI (acute kidney injury) (HCC) 2018   "from overdose"  . Anxiety   . Daily headache   . Depression   . Drug overdose 04/2016   [name] 05/01/2016  . GERD (gastroesophageal reflux disease)    "when I was younger; gone now" (09/21/2017)  . Migraine    "q couple weeks" (09/21/2017)  . Overdose     Past Surgical History:  Procedure Laterality Date  . APPENDECTOMY  04/2013  . I & D EXTREMITY Left 09/20/2017   Procedure: IRRIGATION AND DEBRIDEMENT LEFT ARM;  Surgeon: Bradly Bienenstock, MD;  [facility]: [facility];  Service: Orthopedics;  Laterality: Left;  . TEE WITHOUT CARDIOVERSION N/A 02/10/2019   Procedure: TRANSESOPHAGEAL ECHOCARDIOGRAM (TEE);  Surgeon: Debbe Odea, MD;  Location: [facility] ORS;  Service: Cardiovascular;  Laterality: N/A;  Polysubstance abuser  . TONGUE SURGERY  ~ 2007   "related to lisp"  . TRACHEOSTOMY  04/2016   [name] 05/01/2016    There were no vitals filed for this visit.  Subjective Assessment - 04/11/19 1618    Subjective  Pt reports doing well. No new falls. No changes in health/medications reported. No pain upon arrival. Reports adherence with HEP. Boyfriend [name] joins patient for therapy session.    Patient is accompained by:  --   significant  other   Pertinent History  Pt is 24 y.o. female presenting to hospital 02/07/19 initially,  MRI showing acute infarct R MCA territory affecting basal ganglia and affecting cortical and subcortical brain in a largely watershed distribution. After in hospital stay patient transitioned to CIR from 02/16/2019-03/01/2019.  PMH includes IV drug abuse, anxiety, depression, migraines, AKI, hypoxic ischemic encephalopathy 2018, opiate overdose 2018, h/o trach 2018, h/o I&D L UE. PLOF: lives with boyfriend in one story apartment, prior to CVA patient was independent, working.    Limitations  Walking;Reading;Lifting;Writing;House hold activities;Standing    How long can you sit comfortably?  NA    How long can you stand comfortably?  20 mins    How long can you walk comfortably?  20 mins    Diagnostic tests  see MRI results    Patient Stated Goals  Patient wants to maximize how much she can walk, get back to normal    Currently in Pain?  No/denies         TREATMENT   Ther-ex  Hooklying clams with manual resistance from therapist x 10; Hooklying adductor squeeze with manual resistance from therapist x 10; Hooklying BLE bridges 3s hold x 10; Hooklying L SLR x 10, pt cued to initiate with quad set to maintain knee straight; Manually resisted LLE leg  press x 10; R sidelying L hip abduction x 10, assist from therapist to block body to prevent compensation; Prone L HS curls 2 x 10; Prone L hip straight and bent knee extension x 10 each; Sit to stand without UE support from mat table with RLE on 6" step and extended 2 x 10, therapist assisting pt to maintain even WB through BLE;   Neuromuscular Re-education  LLE D1 resisted flexion/extension x 10; Tall kneeling dynamic perturbations from therapist to challenge hip stability and balance, pt struggles with posterior to anterior force occasionally losing balance forwards; Tall kneeling dynamic reaching alternating UE with therapist moving cones in  different directions/distances and occasionally crossing midline; Quadriped dynamic reaching with therapist moving targets in different directions/distances and occasionally crossing midline; Quadruped alternating LE extension with hold x 10 bilateral; Quadruped bird dogs with cues for improved hip extension especially on the L side. Pt requires some CGA/minA+1 for core stability;    Pt educated throughout session about proper posture and technique with exercises. Improved exercise technique, movement at target joints, use of target muscles after min to mod verbal, visual, tactile cues.    Patient demonstrates excellent motivation today during session. She is highly motivated to be able to stop using her L AFO and comes to therapy wearing an air cast on her L ankle instead of her brace because she reports she wants to allow her ankle to flex and extend. We discussed the upcoming botox injections as well as the fact that she may have to wear the AFO indefinitely even if she gets some of her dorsiflexion strength back. She is able to complete advanced strengthening today. Left hamstring activation continues to improve compared to previous session. Pt encouraged to continue her HEP. She will benefit from PT services to address deficits in strength, balance, and mobility in order to return to full function at home.                         PT Short Term Goals - 03/02/19 1542      PT SHORT TERM GOAL #1   Title  Pt will be independent with initial HEP in order to indicate improved strength and decreased fall risk.    Baseline  initial HEP administered on eval 03/02/2019    Time  6    Period  Weeks    Status  New    Target Date  04/13/19        PT Long Term Goals - 03/02/19 1543      PT LONG TERM GOAL #1   Title  Pt will be independent with final HEP in order to indicate improved strength and decreased fall risk.    Baseline  initial HEP administered on eval 03/02/19     Time  12    Period  Weeks    Status  New    Target Date  05/25/19      PT LONG TERM GOAL #2   Title  Pt will decrease 5TSTS to less than 10 seconds in order to demonstrate clinically significant improvement in LE strength and functional mobility.    Baseline  03/02/2019: 17 seconds without UE support, dependent on RLE, very little weight shift to LLE    Time  12    Period  Weeks    Status  New    Target Date  05/25/19      PT LONG TERM GOAL #3   Title  Pt will  perform > 1024ft with LRAD at mod I level in order to indicate safe community/leisure negotiation.    Baseline  03/02/2019 527ft    Time  12    Period  Weeks    Status  New    Target Date  05/25/19      PT LONG TERM GOAL #4   Title  The patient will demonstrate at least 1/2 grade improvement in LE MMT grade to improve ability to perform functional activities.    Baseline  see eval for details 03/02/2019    Time  12    Period  Weeks    Status  New    Target Date  05/25/19      PT LONG TERM GOAL #5   Title  Pt will ambulate >1 m/s with LRAD mod I to indicate gait velocity of community ambulator.    Baseline  self selected: .57 m/s, fastest .27m/s with quad cane    Time  12    Period  Weeks    Status  New    Target Date  05/25/19            Plan - 04/11/19 1655    Clinical Impression Statement  Patient demonstrates excellent motivation today during session. She is highly motivated to be able to stop using her L AFO and comes to therapy wearing an air cast on her L ankle instead of her brace because she reports she wants to allow her ankle to flex and extend. We discussed the upcoming botox injections as well as the fact that she may have to wear the AFO indefinitely even if she gets some of her dorsiflexion strength back. She is able to complete advanced strengthening today. Left hamstring activation continues to improve compared to previous session. Pt encouraged to continue her HEP. She will benefit from PT  services to address deficits in strength, balance, and mobility in order to return to full function at home.    Personal Factors and Comorbidities  Behavior Pattern    Examination-Activity Limitations  Bathing;Dressing;Sit;Transfers;Sleep;Bed Mobility;Bend;Caring for Others;Carry;Toileting;Reach Overhead;Stand;Locomotion Level;Stairs;Squat;Lift;Hygiene/Grooming    Examination-Participation Restrictions  Church;Interpersonal Relationship;Personal Finances;Yard Work;Cleaning;Laundry;School;Community Activity;Medication Management;Driving;Meal Prep;Shop    Stability/Clinical Decision Making  Evolving/Moderate complexity    Rehab Potential  Good    PT Frequency  2x / week    PT Duration  12 weeks    PT Treatment/Interventions  ADLs/Self Care Home Management;DME Instruction;Gait training;Stair training;Functional mobility training;Therapeutic activities;Therapeutic exercise;Balance training;Neuromuscular re-education;Cognitive remediation;Patient/family education;Vestibular;Energy conservation;Cryotherapy;Ultrasound;Electrical Stimulation;Fluidtherapy;Iontophoresis 4mg /ml Dexamethasone;Canalith Repostioning;Moist Heat;Traction;Passive range of motion;Manual techniques;Joint Manipulations;Spinal Manipulations;Splinting;Taping;Scar mobilization;Wheelchair mobility training;Prosthetic Training;Orthotic Fit/Training;Dry needling;Visual/perceptual remediation/compensation;Aquatic Therapy    PT Next Visit Plan  higher level balance assessment,    PT Home Exercise Plan  Access Code:    Consulted and Agree with Plan of Care  Patient;Other (Comment)   boyfriend      Patient will benefit from skilled therapeutic intervention in order to improve the following deficits and impairments:  Decreased activity tolerance, Decreased balance, Decreased cognition, Decreased endurance, Decreased knowledge of use of DME, Decreased mobility, Decreased strength, Impaired perceived functional ability, Postural  dysfunction, Abnormal gait, Difficulty walking, Impaired tone, Decreased range of motion, Decreased coordination, Impaired UE functional use, Pain  Visit Diagnosis: Muscle weakness (generalized)  Other abnormalities of gait and mobility     Problem List Patient Active Problem List   Diagnosis Date Noted  . Spastic hemiparesis (HCC)   . Elevated BUN   . Vascular headache   . Mood disorder in conditions  classified elsewhere   . 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 Polysubstance abuse ([place])   . Dyslipidemia   . Ischemic stroke diagnosed during current admission ([place]) 02/13/2019  . MDD (major depressive disorder), recurrent episode, moderate ([place]) 02/10/2019  . Dysphagia 02/09/2019  . Elevated CK 02/09/2019  . CVA (cerebral vascular accident) ([place]) 02/07/2019  . Cellulitis of arm 09/20/2017  . IV drug abuse ([place]) 09/20/2017  . Quadriceps weakness 06/18/2016  . TBI (traumatic brain injury) ([place]) 05/01/2016  . 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 Respiratory distress   . Glasgow coma scale total score 3-8 ([place])   . Pneumonia of both lower lobes due to methicillin resistant Staphylococcus aureus (MRSA) ([place])   . Acute pulmonary edema (HCC)   . Non-traumatic rhabdomyolysis   . Opioid abuse ([place])   . Elevated troponin   . Acute respiratory failure with hypoxia ([place])   . Drug overdose   . Elevated liver enzymes   . Encephalopathy acute   . Encephalopathy 04/19/2016  . Opiate overdose ([place]) 04/19/2016   Phillips Grout PT, DPT, GCS  [name] 04/12/2019, 11:04 AM  [place] MAIN [facility] SERVICES [address] [place], [place], [address] Phone: [phone]   Fax:  [phone]  Name: Najma Bozarth MRN: [id number] Date of Birth: 1996-01-28

## 2019-04-13 ENCOUNTER — Encounter: Payer: 59 | Admitting: Occupational Therapy

## 2019-04-13 ENCOUNTER — Ambulatory Visit: Payer: 59 | Admitting: Physical Therapy

## 2019-04-14 ENCOUNTER — Ambulatory Visit: Payer: 59 | Admitting: Occupational Therapy

## 2019-04-14 ENCOUNTER — Encounter: Payer: Self-pay | Admitting: Occupational Therapy

## 2019-04-14 ENCOUNTER — Ambulatory Visit: Payer: 59

## 2019-04-14 ENCOUNTER — Other Ambulatory Visit: Payer: Self-pay

## 2019-04-14 VITALS — BP 114/73 | HR 95

## 2019-04-14 DIAGNOSIS — R278 Other lack of coordination: Secondary | ICD-10-CM

## 2019-04-14 DIAGNOSIS — M6281 Muscle weakness (generalized): Secondary | ICD-10-CM | POA: Diagnosis not present

## 2019-04-14 DIAGNOSIS — R2689 Other abnormalities of gait and mobility: Secondary | ICD-10-CM

## 2019-04-14 NOTE — Therapy (Signed)
[place] [facility] MAIN [facility] SERVICES [address] [place], [place], [address] Phone: [phone]   Fax:  [phone]  Physical Therapy Treatment  Patient Details  Name: Jane Watkins MRN: [id number] Date of Birth: Jan 28, 1996 Referring Provider (PT): Dr. Herma Carson. [name]   Encounter Date: 04/14/2019  PT End of Session - 04/14/19 1538    Visit Number  8    Number of Visits  24    Date for PT Re-Evaluation  05/25/19    PT Start Time  1532    PT Stop Time  1615    PT Time Calculation (min)  43 min    Equipment Utilized During Treatment  Gait belt    Activity Tolerance  Patient tolerated treatment well    Behavior During Therapy  Flat affect       Past Medical History:  Diagnosis Date  . AKI (acute kidney injury) (HCC) 2018   "from overdose"  . Anxiety   . Daily headache   . Depression   . Drug overdose 04/2016   [name] 05/01/2016  . GERD (gastroesophageal reflux disease)    "when I was younger; gone now" (09/21/2017)  . Migraine    "q couple weeks" (09/21/2017)  . Overdose     Past Surgical History:  Procedure Laterality Date  . APPENDECTOMY  04/2013  . I & D EXTREMITY Left 09/20/2017   Procedure: IRRIGATION AND DEBRIDEMENT LEFT ARM;  Surgeon: Bradly Bienenstock, MD;  Location: [facility] OR;  Service: Orthopedics;  Laterality: Left;  . TEE WITHOUT CARDIOVERSION N/A 02/10/2019   Procedure: TRANSESOPHAGEAL ECHOCARDIOGRAM (TEE);  Surgeon: Debbe Odea, MD;  Location: [facility] ORS;  Service: Cardiovascular;  Laterality: N/A;  Polysubstance abuser  . TONGUE SURGERY  ~ 2007   "related to lisp"  . TRACHEOSTOMY  04/2016   [name] 05/01/2016    Vitals:   04/14/19 1538  BP: 114/73  Pulse: 95  SpO2: 98%    Subjective Assessment - 04/14/19 1537    Subjective  Pt reports doing well. No new falls. No changes in health/medications reported. No pain upon arrival. Reports adherence with HEP. Father joins patient for therapy session. Pt is wondering if botox injections  will impede her progress with therapy.    Patient is accompained by:  --   significant other   Pertinent History  Pt is 24 y.o. female presenting to hospital 02/07/19 initially,  MRI showing acute infarct R MCA territory affecting basal ganglia and affecting cortical and subcortical brain in a largely watershed distribution. After in hospital stay patient transitioned to CIR from 02/16/2019-03/01/2019.  PMH includes IV drug abuse, anxiety, depression, migraines, AKI, hypoxic ischemic encephalopathy 2018, opiate overdose 2018, h/o trach 2018, h/o I&D L UE. PLOF: lives with boyfriend in one story apartment, prior to CVA patient was independent, working.    Limitations  Walking;Reading;Lifting;Writing;House hold activities;Standing    How long can you sit comfortably?  NA    How long can you stand comfortably?  20 mins    How long can you walk comfortably?  20 mins    Diagnostic tests  see MRI results    Patient Stated Goals  Patient wants to maximize how much she can walk, get back to normal    Currently in Pain?  No/denies          TREATMENT   Ther-ex  NuStep L2 x 5 minutes for warm-up during history (2 minutes unbilled); Hooklying L SLR x 10, pt cued to initiate with quad set to maintain  knee straight; Hooklying BLE bridges 3s hold x 10; Hooklying L single leg bridge 3s hold x 10; Manually resisted LLE leg press x 10; R sidelying knee bridges 3s hold x 10; L sidelying knee bridges 3s hold x 10; Left single leg sit to stand from elevated mat table without UE support, CGA/minA+1 from therapist    Electrical Stimulation SeatedNMES to L anterior tibialis and L peroneus longus/breviswith 1channel (2 pads), symmetrical waveform, 300 microsec pulse width, 50hz  rate, 2s ramp, 12s, on, 30s offat intensity required to get a tetanic contraction (28)x33minutes with therapist providing constantverbal and tactile cues for contraction.;    Neuromuscular Re-education LLE D1 resisted  flexion/extension x 10; R rolling with therapist providing resistance initially at LUE and then progressed to shoulder to provide more resistance x 10, pt requiring extensive cues for effort and rolling technique;   Pt educated throughout session about proper posture and technique with exercises. Improved exercise technique, movement at target joints, use of target muscles after min to mod verbal, visual, tactile cues.   Patient demonstrates excellent motivation todayduring session. She is concerned that having Botox will limit her progress with therapy. Therapist provided reassurance that Botox injections in her plantarflexors will actually improve her ability to progress with therapy. Utilized NMES on L anterior tibialis/peroneus muscles today to work on 4m. Also progressed LE strengthening and incorporated rolling to assist in L oblique activation. Pt will benefit from PT services to address deficits in strength, balance, and mobility in order to return to full function at home.                        PT Short Term Goals - 03/02/19 1542      PT SHORT TERM GOAL #1   Title  Pt will be independent with initial HEP in order to indicate improved strength and decreased fall risk.    Baseline  initial HEP administered on eval 03/02/2019    Time  6    Period  Weeks    Status  New    Target Date  04/13/19        PT Long Term Goals - 03/02/19 1543      PT LONG TERM GOAL #1   Title  Pt will be independent with final HEP in order to indicate improved strength and decreased fall risk.    Baseline  initial HEP administered on eval 03/02/19    Time  12    Period  Weeks    Status  New    Target Date  05/25/19      PT LONG TERM GOAL #2   Title  Pt will decrease 5TSTS to less than 10 seconds in order to demonstrate clinically significant improvement in LE strength and functional mobility.    Baseline  03/02/2019: 17 seconds without UE support,  dependent on RLE, very little weight shift to LLE    Time  12    Period  Weeks    Status  New    Target Date  05/25/19      PT LONG TERM GOAL #3   Title  Pt will perform 05/27/19 > 1044ft with LRAD at mod I level in order to indicate safe community/leisure negotiation.    Baseline  03/02/2019 551ft    Time  12    Period  Weeks    Status  New    Target Date  05/25/19      PT LONG TERM GOAL #4  Title  The patient will demonstrate at least 1/2 grade improvement in LE MMT grade to improve ability to perform functional activities.    Baseline  see eval for details 03/02/2019    Time  12    Period  Weeks    Status  New    Target Date  05/25/19      PT LONG TERM GOAL #5   Title  Pt will ambulate >1 m/s with LRAD mod I to indicate gait velocity of community ambulator.    Baseline  self selected: .57 m/s, fastest .45m/s with quad cane    Time  12    Period  Weeks    Status  New    Target Date  05/25/19            Plan - 04/14/19 1538    Clinical Impression Statement  Patient demonstrates excellent motivation today during session. She is concerned that having Botox will limit her progress with therapy. Therapist provided reassurance that Botox injections in her plantarflexors will actually improve her ability to progress with therapy. Utilized NMES on L anterior tibialis/peroneus muscles today to work on Museum/gallery curator. Also progressed LE strengthening and incorporated rolling to assist in L oblique activation. Pt will benefit from PT services to address deficits in strength, balance, and mobility in order to return to full function at home.    Personal Factors and Comorbidities  Behavior Pattern    Examination-Activity Limitations  Bathing;Dressing;Sit;Transfers;Sleep;Bed Mobility;Bend;Caring for Others;Carry;Toileting;Reach Overhead;Stand;Locomotion Level;Stairs;Squat;Lift;Hygiene/Grooming    Examination-Participation Restrictions  Church;Interpersonal Relationship;Personal  Finances;Yard Work;Cleaning;Laundry;School;Community Activity;Medication Management;Driving;Meal Prep;Shop    Stability/Clinical Decision Making  Evolving/Moderate complexity    Rehab Potential  Good    PT Frequency  2x / week    PT Duration  12 weeks    PT Treatment/Interventions  ADLs/Self Care Home Management;DME Instruction;Gait training;Stair training;Functional mobility training;Therapeutic activities;Therapeutic exercise;Balance training;Neuromuscular re-education;Cognitive remediation;Patient/family education;Vestibular;Energy conservation;Cryotherapy;Ultrasound;Electrical Stimulation;Fluidtherapy;Iontophoresis 4mg /ml Dexamethasone;Canalith Repostioning;Moist Heat;Traction;Passive range of motion;Manual techniques;Joint Manipulations;Spinal Manipulations;Splinting;Taping;Scar mobilization;Wheelchair mobility training;Prosthetic Training;Orthotic Fit/Training;Dry needling;Visual/perceptual remediation/compensation;Aquatic Therapy    PT Next Visit Plan  continue with LE strengthening, NME, and motor control activities    PT Home Exercise Plan  Access Code: [email or web address]    Consulted and Agree with Plan of Care  Patient;Other (Comment)   boyfriend [name]      Patient will benefit from skilled therapeutic intervention in order to improve the following deficits and impairments:  Decreased activity tolerance, Decreased balance, Decreased cognition, Decreased endurance, Decreased knowledge of use of DME, Decreased mobility, Decreased strength, Impaired perceived functional ability, Postural dysfunction, Abnormal gait, Difficulty walking, Impaired tone, Decreased range of motion, Decreased coordination, Impaired UE functional use, Pain  Visit Diagnosis: Muscle weakness (generalized)  Other abnormalities of gait and mobility     Problem List Patient Active Problem List   Diagnosis Date Noted  . Spastic hemiparesis ([place])   . Elevated BUN   . Vascular headache   . Mood disorder in conditions  classified elsewhere   . 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 Polysubstance abuse ([place])   . Dyslipidemia   . Ischemic stroke diagnosed during current admission ([place]) 02/13/2019  . MDD (major depressive disorder), recurrent episode, moderate ([place]) 02/10/2019  . Dysphagia 02/09/2019  . Elevated CK 02/09/2019  . CVA (cerebral vascular accident) ([place]) 02/07/2019  . Cellulitis of arm 09/20/2017  . IV drug abuse ([place]) 09/20/2017  . Quadriceps weakness 06/18/2016  . TBI (traumatic brain injury) ([place]) 05/01/2016  . 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 Respiratory distress   .  Glasgow coma scale total score 3-8 (HCC)   . Pneumonia of both lower lobes due to methicillin resistant Staphylococcus aureus (MRSA) (HCC)   . Acute pulmonary edema (HCC)   . Non-traumatic rhabdomyolysis   . Opioid abuse (HCC)   . Elevated troponin   . Acute respiratory failure with hypoxia (HCC)   . Drug overdose   . Elevated liver enzymes   . Encephalopathy acute   . Encephalopathy 04/19/2016  . Opiate overdose (HCC) 04/19/2016   Lynnea Maizes PT, DPT, GCS  Jane Watkins 04/15/2019, 11:28 AM  [place] [facility] MAIN [facility] SERVICES [address] [place], [place], [address] Phone: [phone]   Fax:  [phone]  Name: Jane Watkins MRN: [id number] Date of Birth: 1995-06-22

## 2019-04-14 NOTE — Therapy (Addendum)
[place] [facility] MAIN [facility] SERVICES [address] [place], [place], [address] Phone: [phone]   Fax:  [phone]  Occupational Therapy Treatment  Patient Details  Name: Jane Watkins MRN: [id number] Date of Birth: 1995/06/02 Referring Provider (OT): Dr. [name]   Encounter Date: 04/14/2019  OT End of Session - 04/14/19 1805    Visit Number  8    Number of Visits  24    Date for OT Re-Evaluation  05/30/19    Authorization Type  Progress report period starting 03/07/2019    OT Start Time  1620    OT Stop Time  1700    OT Time Calculation (min)  40 min    Activity Tolerance  Patient tolerated treatment well    Behavior During Therapy  Flat affect       Past Medical History:  Diagnosis Date  . AKI (acute kidney injury) (HCC) 2018   "from overdose"  . Anxiety   . Daily headache   . Depression   . Drug overdose 04/2016   [name] 05/01/2016  . GERD (gastroesophageal reflux disease)    "when I was younger; gone now" (09/21/2017)  . Migraine    "q couple weeks" (09/21/2017)  . Overdose     Past Surgical History:  Procedure Laterality Date  . APPENDECTOMY  04/2013  . I & D EXTREMITY Left 09/20/2017   Procedure: IRRIGATION AND DEBRIDEMENT LEFT ARM;  Surgeon: Bradly Bienenstock, MD;  [facility]: [facility] OR;  Service: Orthopedics;  Laterality: Left;  . TEE WITHOUT CARDIOVERSION N/A 02/10/2019   Procedure: TRANSESOPHAGEAL ECHOCARDIOGRAM (TEE);  Surgeon: Debbe Odea, MD;  Location: [facility] ORS;  Service: Cardiovascular;  Laterality: N/A;  Polysubstance abuser  . TONGUE SURGERY  ~ 2007   "related to lisp"  . TRACHEOSTOMY  04/2016   [name] 05/01/2016    There were no vitals filed for this visit.  Subjective Assessment - 04/14/19 1805    Subjective   Pt. was accompanied to therapy by her father.    Pertinent History  Per chart. Pt. is a 24 y.o.female with a history of IV drug use, polysubstance as well as tobacco abuse, anxiety. Pt. presented to [facility] on  02/07/2019 after being found unresponsive.  EMS contacted patient received Narcan with some improvement in mental status.  Admission labs with WBC 20,700, potassium 5.2, BUN 25, creatinine 0.67, urine drug screen positive cocaine, marijuana and benzodiazepines, alcohol level less than 10, SARS coronavirus negative.  Cranial CT scan showed areas of indistinct low-density in the right basal ganglia with mass-effect on the right lateral ventricle.  Patient did not receive TPA.  MRI showed acute infarction of right MCA territory, affecting the basal ganglia and affecting the cortical and subcortical brain in a largely watershed distribution. Pt. had a previous inpatient rehab admission 05/01/2016 to 05/09/2016 for hypoxic encephalopathy after drug overdose requiring tracheostomy, and was later decannulated. Pt. resides with her boyfriend in an apartment. Pt. worked full time as a Production designer, theatre/television/film at [employer]. pt. enjoys travelling.    Patient Stated Goals  Patient reports she would like to be as independent as possible with all her daily tasks.    Currently in Pain?  No/denies       OT TREATMENT    Neuro muscular re-education:  Pt. worked on reaching using the shape tower. Pt. Grasped and moved the shapes through 4 vertical dowels of varying heights using her left hand. Pt. Had difficulty moving the square shapes up the first vertical post. Pt. Was  able to move the circles up the vertical post with increased time, and one square in standing. Pt. Moved the shapes from the table base through each rung starting at the base for each group. Pt. Performed from sitting with increased leaning through the trunk when reaching with the LUE.  Selfcare:  Pt. Was provided with elastic shoelaces for her athletic sneakers.  Pt. Laces were adjusted, and modified. Pt. requires mod Assist to donn the left shoe with the AFO. Pt. education was provided about application, and use of the elastic laces.   Pt. continues to make steady  progress with LUE functioning. Pt. reports that the red foam cylinder is helping to keep her thumb from pressing the lateral aspect of her 2nd digit. Pt. was provided with elastic lock laces for her sneakers,a nd adjustments were made. Pt. continues to has difficulty donning the left shoe with the AFO in place. Pt. Pt. continues to work on improving LUE functioning in order to improve functional reaching during ADLs, and IADLs.                      OT Education - 04/14/19 1805    Education Details  ROM and shoulder stabilization exercises    Person(s) Educated  Patient    Methods  Explanation;Demonstration;Tactile cues;Verbal cues;Handout    Comprehension  Verbalized understanding;Returned demonstration;Need further instruction          OT Long Term Goals - 03/07/19 1814      OT LONG TERM GOAL #1   Title  Pt. will increase active isolated left shoulder flexion by 20 degrees in preparation for combing her hair.    Baseline  Eval: No isolated shoulder shoulder flexion. Pt. compensates with synergystic movement with attempts for flexion.    Time  12    Period  Weeks    Status  New    Target Date  05/30/19      OT LONG TERM GOAL #2   Title  Pt. will increase active shoulder abduction by 20 degrees in preparation to be able to wash her hair.    Baseline  Eval: Active shoulder abduction 56 degrees.    Time  12    Period  Weeks    Status  New    Target Date  05/30/19      OT LONG TERM GOAL #3   Title  Pt. will improve hand to face patterns using her left hand to be able to independently wash her face.    Baseline  Eval: Pt. is unable to wash her face using her left hand    Time  12    Period  Weeks    Status  New    Target Date  05/30/19      OT LONG TERM GOAL #4   Title  Pt. will increase AROM wrist extension by 10 degrees in preparation for reaching for a cup    Baseline  Eval: 0 degrees of active wrist extension.    Time  12    Period  Weeks    Status  New     Target Date  05/30/19      OT LONG TERM GOAL #5   Title  Pt. will increase left digit flexion to be able to initiate actively holding a toothbrush.    Baseline  Eval: Pt. is unable to use her left hand to hold a toothbrush.    Time  12    Period  Weeks  Target Date  05/30/19            Plan - 04/14/19 1806    Clinical Impression Statement Pt. continues to make steady progress with LUE functioning. Pt. reports that the red foam cylinder is helping to keep her thumb from pressing the lateral aspect of her 2nd digit. Pt. was provided with elastic lock laces for her sneakers,a nd adjustments were made. Pt. continues to has difficulty donning the left shoe with the AFO in place. Pt. Pt. continues to work on improving LUE functioning in order to improve functional reaching during ADLs, and IADLs.   OT Occupational Profile and History  Detailed Assessment- Review of Records and additional review of physical, cognitive, psychosocial history related to current functional performance    Occupational performance deficits (Please refer to evaluation for details):  ADL's;IADL's    Body Structure / Function / Physical Skills  ADL;IADL;Coordination;Endurance;UE functional use;Decreased knowledge of precautions;Dexterity;FMC;ROM;Tone;Strength    Rehab Potential  Good    Clinical Decision Making  Several treatment options, min-mod task modification necessary    Comorbidities Affecting Occupational Performance:  May have comorbidities impacting occupational performance    Modification or Assistance to Complete Evaluation   Min-Moderate modification of tasks or assist with assess necessary to complete eval    OT Frequency  2x / week    OT Duration  12 weeks    OT Treatment/Interventions  Self-care/ADL training;DME and/or AE instruction;Energy conservation;Therapeutic activities;Patient/family education;Therapeutic exercise;Neuromuscular education    Consulted and Agree with Plan of Care  Patient        Patient will benefit from skilled therapeutic intervention in order to improve the following deficits and impairments:   Body Structure / Function / Physical Skills: ADL, IADL, Coordination, Endurance, UE functional use, Decreased knowledge of precautions, Dexterity, FMC, ROM, Tone, Strength       Visit Diagnosis: Muscle weakness (generalized)  Other lack of coordination    Problem List Patient Active Problem List   Diagnosis Date Noted  . Spastic hemiparesis (HCC)   . Elevated BUN   . Vascular headache   . Mood disorder in conditions classified elsewhere   . 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 Polysubstance abuse (HCC)   . Dyslipidemia   . Ischemic stroke diagnosed during current admission (HCC) 02/13/2019  . MDD (major depressive disorder), recurrent episode, moderate (HCC) 02/10/2019  . Dysphagia 02/09/2019  . Elevated CK 02/09/2019  . CVA (cerebral vascular accident) (HCC) 02/07/2019  . Cellulitis of arm 09/20/2017  . IV drug abuse (HCC) 09/20/2017  . Quadriceps weakness 06/18/2016  . TBI (traumatic brain injury) (HCC) 05/01/2016  . 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 Respiratory distress   . Glasgow coma scale total score 3-8 (HCC)   . Pneumonia of both lower lobes due to methicillin resistant Staphylococcus aureus (MRSA) (HCC)   . Acute pulmonary edema (HCC)   . Non-traumatic rhabdomyolysis   . Opioid abuse (HCC)   . Elevated troponin   . Acute respiratory failure with hypoxia (HCC)   . Drug overdose   . Elevated liver enzymes   . Encephalopathy acute   . Encephalopathy 04/19/2016  . Opiate overdose (HCC) 04/19/2016    Olegario Messier, MS, OTR/L 04/14/2019, 6:13 PM  [place] [facility] MAIN [facility] SERVICES [address] [place], [place], [address] Phone: [phone]   Fax:  [phone]  Name: Jacklin Zwick MRN: [id number] Date of Birth: 04-18-1995

## 2019-04-18 ENCOUNTER — Ambulatory Visit: Payer: 59

## 2019-04-18 ENCOUNTER — Ambulatory Visit: Payer: 59 | Admitting: Occupational Therapy

## 2019-04-18 ENCOUNTER — Encounter: Payer: Self-pay | Admitting: Occupational Therapy

## 2019-04-18 ENCOUNTER — Other Ambulatory Visit: Payer: Self-pay

## 2019-04-18 DIAGNOSIS — R278 Other lack of coordination: Secondary | ICD-10-CM

## 2019-04-18 DIAGNOSIS — M6281 Muscle weakness (generalized): Secondary | ICD-10-CM | POA: Diagnosis not present

## 2019-04-18 DIAGNOSIS — R2689 Other abnormalities of gait and mobility: Secondary | ICD-10-CM

## 2019-04-18 DIAGNOSIS — I63511 Cerebral infarction due to unspecified occlusion or stenosis of right middle cerebral artery: Secondary | ICD-10-CM

## 2019-04-18 NOTE — Therapy (Signed)
[place] [facility] MAIN [facility] SERVICES [address] [place], [place], [address] Phone: [phone]   Fax:  [phone]  Physical Therapy Treatment  Patient Details  Name: Jane Watkins MRN: [id number] Date of Birth: Sep 17, 1995 Referring Provider (PT): Dr. Herma Carson. [name]   Encounter Date: 04/18/2019  PT End of Session - 04/18/19 1613    Visit Number  9    Number of Visits  24    Date for PT Re-Evaluation  05/25/19    Authorization Type  [employer], 60 visit limit per discipline    PT Start Time  1604    PT Stop Time  1644    PT Time Calculation (min)  40 min    Equipment Utilized During Treatment  Gait belt    Activity Tolerance  Patient tolerated treatment well;No increased pain    Behavior During Therapy  Flat affect       Past Medical History:  Diagnosis Date  . AKI (acute kidney injury) (HCC) 2018   "from overdose"  . Anxiety   . Daily headache   . Depression   . Drug overdose 04/2016   [name] 05/01/2016  . GERD (gastroesophageal reflux disease)    "when I was younger; gone now" (09/21/2017)  . Migraine    "q couple weeks" (09/21/2017)  . Overdose     Past Surgical History:  Procedure Laterality Date  . APPENDECTOMY  04/2013  . I & D EXTREMITY Left 09/20/2017   Procedure: IRRIGATION AND DEBRIDEMENT LEFT ARM;  Surgeon: Bradly Bienenstock, MD;  [facility]: [facility] OR;  Service: Orthopedics;  Laterality: Left;  . TEE WITHOUT CARDIOVERSION N/A 02/10/2019   Procedure: TRANSESOPHAGEAL ECHOCARDIOGRAM (TEE);  Surgeon: Debbe Odea, MD;  Location: [facility] ORS;  Service: Cardiovascular;  Laterality: N/A;  Polysubstance abuser  . TONGUE SURGERY  ~ 2007   "related to lisp"  . TRACHEOSTOMY  04/2016   [name] 05/01/2016    There were no vitals filed for this visit.  Subjective Assessment - 04/18/19 1609    Subjective  Pt doing well this date, reports still feels nervous about use of steps.    Pertinent History  Pt is 24 y.o. female presenting to hospital 02/07/19  initially,  MRI showing acute infarct R MCA territory affecting basal ganglia and affecting cortical and subcortical brain in a largely watershed distribution. After in hospital stay patient transitioned to CIR from 02/16/2019-03/01/2019.  PMH includes IV drug abuse, anxiety, depression, migraines, AKI, hypoxic ischemic encephalopathy 2018, opiate overdose 2018, h/o trach 2018, h/o I&D L UE. PLOF: lives with boyfriend in one story apartment, prior to CVA patient was independent, working.    Currently in Pain?  No/denies       INTERVENTION THIS DATE: -[place] 2x15x3secH  -dead bug transition isometric legs, unison arms with PVC 2x20 -Left SLR, cues for knee lock 1x15, 3x6; control fluctuates,  -Left Bent Knee raise to end range, wide to avoid pectineus dominance 2x15 -SAQ on bolster 2x10, cues and intermittent assist for TKE lock/full ROM, 0lb resistance  -LLE manually resisted Leg press 2x10 (starting from end range flexion)   -Half kneeling trunk/head rotation c sternal rainbow ball 1x16 each way -quadruped on elbows (wrist stiffness) LLE extension 1x10, manual assist for full ROM for last 4  -4" plastic step training 10x Lateral up LLE, 10x fwd up LLE, 10 RLE step down (eccenctric LLE)    PT Short Term Goals - 03/02/19 1542      PT SHORT TERM GOAL #1  Title  Pt will be independent with initial HEP in order to indicate improved strength and decreased fall risk.    Baseline  initial HEP administered on eval 03/02/2019    Time  6    Period  Weeks    Status  New    Target Date  04/13/19        PT Long Term Goals - 03/02/19 1543      PT LONG TERM GOAL #1   Title  Pt will be independent with final HEP in order to indicate improved strength and decreased fall risk.    Baseline  initial HEP administered on eval 03/02/19    Time  12    Period  Weeks    Status  New    Target Date  05/25/19      PT LONG TERM GOAL #2   Title  Pt will decrease 5TSTS to less than 10 seconds in  order to demonstrate clinically significant improvement in LE strength and functional mobility.    Baseline  03/02/2019: 17 seconds without UE support, dependent on RLE, very little weight shift to LLE    Time  12    Period  Weeks    Status  New    Target Date  05/25/19      PT LONG TERM GOAL #3   Title  Pt will perform > 1054ft with LRAD at mod I level in order to indicate safe community/leisure negotiation.    Baseline  03/02/2019 573ft    Time  12    Period  Weeks    Status  New    Target Date  05/25/19      PT LONG TERM GOAL #4   Title  The patient will demonstrate at least 1/2 grade improvement in LE MMT grade to improve ability to perform functional activities.    Baseline  see eval for details 03/02/2019    Time  12    Period  Weeks    Status  New    Target Date  05/25/19      PT LONG TERM GOAL #5   Title  Pt will ambulate >1 m/s with LRAD mod I to indicate gait velocity of community ambulator.    Baseline  self selected: .57 m/s, fastest .15m/s with quad cane    Time  12    Period  Weeks    Status  New    Target Date  05/25/19            Plan - 04/18/19 1614    Clinical Impression Statement In general, patient demonstrating good tolerance to therapy session this date, reasonable accommodations are alllowed in-session to allow adequate rest between activities as needed. All interventional executed without any exacerbation of pain or other symptoms. Pt has quick onset fatigue in some muscle groups that limits movement quality. Pt given intermittent multimodal cues to teach best possible form with exercises. Greater assessment of stairs performance at end of session, pt struggles with LLE limitations in multiple planes, really has to exaggerate hip flexion for toe clearance, but going to step and return to floor. Pt continues to make steady progress toward most goals. No home exercise updates made at this time.    Rehab Potential  Good    Clinical Impairments Affecting  Rehab Potential  unsure of severity of cognitive/psych deficits    PT Frequency  2x / week    PT Duration  12 weeks    PT Treatment/Interventions  ADLs/Self  Care Home Management;DME Instruction;Gait training;Stair training;Functional mobility training;Therapeutic activities;Therapeutic exercise;Balance training;Neuromuscular re-education;Cognitive remediation;Patient/family education;Vestibular;Energy conservation;Cryotherapy;Ultrasound;Electrical Stimulation;Fluidtherapy;Iontophoresis 4mg /ml Dexamethasone;Canalith Repostioning;Moist Heat;Traction;Passive range of motion;Manual techniques;Joint Manipulations;Spinal Manipulations;Splinting;Taping;Scar mobilization;Wheelchair mobility training;Prosthetic Training;Orthotic Fit/Training;Dry needling;Visual/perceptual remediation/compensation;Aquatic Therapy    PT Next Visit Plan  continue with LE strengthening, NME, and motor control activities    PT Home Exercise Plan  Access Code: [email or web address]    Consulted and Agree with Plan of Care  Patient       Patient will benefit from skilled therapeutic intervention in order to improve the following deficits and impairments:  Decreased activity tolerance, Decreased balance, Decreased cognition, Decreased endurance, Decreased knowledge of use of DME, Decreased mobility, Decreased strength, Impaired perceived functional ability, Postural dysfunction, Abnormal gait, Difficulty walking, Impaired tone, Decreased range of motion, Decreased coordination, Impaired UE functional use, Pain  Visit Diagnosis: Muscle weakness (generalized)  Other lack of coordination  Other abnormalities of gait and mobility  Right middle cerebral artery stroke [facility])     Problem List Patient Active Problem List   Diagnosis Date Noted  . Spastic hemiparesis ([place])   . Elevated BUN   . Vascular headache   . Mood disorder in conditions classified elsewhere   . Right middle cerebral artery stroke ([place]) 02/16/2019  . Polysubstance  abuse ([place])   . Dyslipidemia   . Ischemic stroke diagnosed during current admission ([place]) 02/13/2019  . MDD (major depressive disorder), recurrent episode, moderate ([place]) 02/10/2019  . Dysphagia 02/09/2019  . Elevated CK 02/09/2019  . CVA (cerebral vascular accident) ([place]) 02/07/2019  . Cellulitis of arm 09/20/2017  . IV drug abuse ([place]) 09/20/2017  . Quadriceps weakness 06/18/2016  . TBI (traumatic brain injury) ([place]) 05/01/2016  . 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 Respiratory distress   . Glasgow coma scale total score 3-8 ([place])   . Pneumonia of both lower lobes due to methicillin resistant Staphylococcus aureus (MRSA) ([place])   . Acute pulmonary edema (HCC)   . Non-traumatic rhabdomyolysis   . Opioid abuse ([place])   . Elevated troponin   . Acute respiratory failure with hypoxia ([place])   . Drug overdose   . Elevated liver enzymes   . Encephalopathy acute   . Encephalopathy 04/19/2016  . Opiate overdose ([place]) 04/19/2016   4:52 PM, 04/18/19 Etta Grandchild, PT, DPT Physical Therapist - [place] [phone]      Etta Grandchild 04/18/2019, 4:17 PM  [place] MAIN [facility] SERVICES [address] [place], [place], [address] Phone: [phone]   Fax:  [phone]  Name: Ahmani Daoud MRN: [id number] Date of Birth: 07/17/1995

## 2019-04-18 NOTE — Therapy (Signed)
[place] MAIN [facility] SERVICES [address] [place], [place], [address] Phone: [phone]   Fax:  [phone]  Occupational Therapy Treatment  Patient Details  Name: Jane Watkins MRN: [id number] Date of Birth: [birth year] Referring Provider (OT): Dr. [name]   Encounter Date: 04/18/2019  OT End of Session - 04/18/19 2242    Visit Number  9    Number of Visits  24    Date for OT Re-Evaluation  05/30/19    Authorization Type  Progress report period starting 03/07/2019    OT Start Time  1518    OT Stop Time  1600    OT Time Calculation (min)  42 min    Activity Tolerance  Patient tolerated treatment well    Behavior During Therapy  Flat affect       Past Medical History:  Diagnosis Date  . AKI (acute kidney injury) ([place]) 2018   "from overdose"  . Anxiety   . Daily headache   . Depression   . Drug overdose 04/2016   Jane Watkins 05/01/2016  . GERD (gastroesophageal reflux disease)    "when I was younger; gone now" (09/21/2017)  . Migraine    "q couple weeks" (09/21/2017)  . Overdose     Past Surgical History:  Procedure Laterality Date  . APPENDECTOMY  04/2013  . I & D EXTREMITY Left 09/20/2017   Procedure: IRRIGATION AND DEBRIDEMENT LEFT ARM;  Surgeon: [name], MD;  Location: [facility];  Service: Orthopedics;  Laterality: Left;  . TEE WITHOUT CARDIOVERSION N/A 02/10/2019   Procedure: TRANSESOPHAGEAL ECHOCARDIOGRAM (TEE);  Surgeon: [name], MD;  Location: [facility];  Service: Cardiovascular;  Laterality: N/A;  Polysubstance abuser  . TONGUE SURGERY  ~ 2007   "related to lisp"  . TRACHEOSTOMY  04/2016   Jane Watkins 05/01/2016    There were no vitals filed for this visit.  Subjective Assessment - 04/18/19 2241    Subjective   Pt. was present at therapy alone today.    Pertinent History  Per chart. Pt. is a 24 y.o.female with a history of IV drug use, polysubstance as well as tobacco abuse, anxiety. Pt. presented to [facility] on 02/07/2019  after being found unresponsive.  EMS contacted patient received Narcan with some improvement in mental status.  Admission labs with WBC 20,700, potassium 5.2, BUN 25, creatinine 0.67, urine drug screen positive cocaine, marijuana and benzodiazepines, alcohol level less than 10, SARS coronavirus negative.  Cranial CT scan showed areas of indistinct low-density in the right basal ganglia with mass-effect on the right lateral ventricle.  Patient did not receive TPA.  MRI showed acute infarction of right MCA territory, affecting the basal ganglia and affecting the cortical and subcortical brain in a largely watershed distribution. Pt. had a previous inpatient rehab admission 05/01/2016 to 05/09/2016 for hypoxic encephalopathy after drug overdose requiring tracheostomy, and was later decannulated. Pt. resides with her boyfriend in an apartment. 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    Patient Stated Goals  Patient reports she would like to be as independent as possible with all her daily tasks.    Currently in Pain?  No/denies      OT TREATMENT    Neuro muscular re-education:  Pt. worked on left hand [facility] skills to grasp 1", 3/4,", and 1/2" flat washers from a magnetic dish. Pt. worked on reaching up and placing them onto vertical, and diagonal dowels. Pt. Dropped several washers when attempting to store them  one at a time in the palm of her hand.   Therapeutic Exercise:  Pt. Worked on shoulder stabilization exercises in supine with the shoulder in 90 degrees of flexion with elbow extended. Pt. worked shoulder protraction with support, circular motion, and formulating the alphabet. Pt. worked on AROM for supination, with passive stretching to the end range with actively holding at the end range.   Pt. continues to make steady progress, and is improving with LUE functioning. Pt. is engaging her left hand in more tasks at home. Pt. presents with increased tone flexor tone in her  hand, and digits. Pt. is responding well to inhibitory techniques with alternating weightbearing during grasping tasks. Pt. is initiating grasping flat objects with her 2nd digit, and thumb. Pt. continues to work on improving LUE functioning in  order to increase LUE engagement during ADLs, and IADL tasks.                      OT Education - 04/18/19 2242    Education Details  ROM and shoulder stabilization exercises    Person(s) Educated  Patient    Methods  Explanation;Demonstration;Tactile cues;Verbal cues;Handout    Comprehension  Verbalized understanding;Returned demonstration;Need further instruction          OT Long Term Goals - 03/07/19 1814      OT LONG TERM GOAL #1   Title  Pt. will increase active isolated left shoulder flexion by 20 degrees in preparation for combing her hair.    Baseline  Eval: No isolated shoulder shoulder flexion. Pt. compensates with synergystic movement with attempts for flexion.    Time  12    Period  Weeks    Status  New    Target Date  05/30/19      OT LONG TERM GOAL #2   Title  Pt. will increase active shoulder abduction by 20 degrees in preparation to be able to wash her hair.    Baseline  Eval: Active shoulder abduction 56 degrees.    Time  12    Period  Weeks    Status  New    Target Date  05/30/19      OT LONG TERM GOAL #3   Title  Pt. will improve hand to face patterns using her left hand to be able to independently wash her face.    Baseline  Eval: Pt. is unable to wash her face using her left hand    Time  12    Period  Weeks    Status  New    Target Date  05/30/19      OT LONG TERM GOAL #4   Title  Pt. will increase AROM wrist extension by 10 degrees in preparation for reaching for a cup    Baseline  Eval: 0 degrees of active wrist extension.    Time  12    Period  Weeks    Status  New    Target Date  05/30/19      OT LONG TERM GOAL #5   Title  Pt. will increase left digit flexion to be able to initiate  actively holding a toothbrush.    Baseline  Eval: Pt. is unable to use her left hand to hold a toothbrush.    Time  12    Period  Weeks    Target Date  05/30/19            Plan - 04/18/19 2243    Clinical Impression  Statement  Pt. continues to make steady progress, and is improving with LUE functioning. Pt. is engaging her left hand in more tasks at home. Pt. presents with increased tone flexor tone in her hand, and digits. Pt. is responding well to inhibitory techniques with alternating weightbearing during grasping tasks. Pt. is initiating grasping flat objects with her 2nd digit, and thumb. Pt. continues to work on improving LUE functioning in  order to increase LUE engagement during ADLs, and IADL tasks.    OT Occupational Profile and History  Detailed Assessment- Review of Records and additional review of physical, cognitive, psychosocial history related to current functional performance    Occupational performance deficits (Please refer to evaluation for details):  ADL's;IADL's    Body Structure / Function / Physical Skills  ADL;IADL;Coordination;Endurance;UE functional use;Decreased knowledge of precautions;Dexterity;FMC;ROM;Tone;Strength    Rehab Potential  Good    Clinical Decision Making  Several treatment options, min-mod task modification necessary    Comorbidities Affecting Occupational Performance:  May have comorbidities impacting occupational performance    Modification or Assistance to Complete Evaluation   Min-Moderate modification of tasks or assist with assess necessary to complete eval    OT Frequency  2x / week    OT Duration  12 weeks    OT Treatment/Interventions  Self-care/ADL training;DME and/or AE instruction;Energy conservation;Therapeutic activities;Patient/family education;Therapeutic exercise;Neuromuscular education    Consulted and Agree with Plan of Care  Patient       Patient will benefit from skilled therapeutic intervention in order to improve the  following deficits and impairments:   Body Structure / Function / Physical Skills: ADL, IADL, Coordination, Endurance, UE functional use, Decreased knowledge of precautions, Dexterity, FMC, ROM, Tone, Strength       Visit Diagnosis: Muscle weakness (generalized)  Other lack of coordination    Problem List Patient Active Problem List   Diagnosis Date Noted  . Spastic hemiparesis (HCC)   . Elevated BUN   . Vascular headache   . Mood disorder in conditions classified elsewhere   . 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 Polysubstance abuse (HCC)   . Dyslipidemia   . Ischemic stroke diagnosed during current admission (HCC) 02/13/2019  . MDD (major depressive disorder), recurrent episode, moderate (HCC) 02/10/2019  . Dysphagia 02/09/2019  . Elevated CK 02/09/2019  . CVA (cerebral vascular accident) (HCC) 02/07/2019  . Cellulitis of arm 09/20/2017  . IV drug abuse (HCC) 09/20/2017  . Quadriceps weakness 06/18/2016  . TBI (traumatic brain injury) (HCC) 05/01/2016  . 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 Respiratory distress   . Glasgow coma scale total score 3-8 (HCC)   . Pneumonia of both lower lobes due to methicillin resistant Staphylococcus aureus (MRSA) (HCC)   . Acute pulmonary edema (HCC)   . Non-traumatic rhabdomyolysis   . Opioid abuse (HCC)   . Elevated troponin   . Acute respiratory failure with hypoxia (HCC)   . Drug overdose   . Elevated liver enzymes   . Encephalopathy acute   . Encephalopathy 04/19/2016  . Opiate overdose (HCC) 04/19/2016    Olegario Messier, OTR/L 04/18/2019, 10:53 PM   [facility] MAIN [facility] SERVICES [address] [place], [place], [address] Phone: [phone]   Fax:  [phone]  Name: Jane Watkins MRN: [id number] Date of Birth: 08/12/1995

## 2019-04-19 ENCOUNTER — Ambulatory Visit (INDEPENDENT_AMBULATORY_CARE_PROVIDER_SITE_OTHER): Payer: 59 | Admitting: Neurology

## 2019-04-19 ENCOUNTER — Other Ambulatory Visit: Payer: Self-pay

## 2019-04-19 ENCOUNTER — Encounter: Payer: Self-pay | Admitting: Neurology

## 2019-04-19 VITALS — BP 102/65 | HR 84 | Temp 97.5°F | Ht 62.0 in | Wt 122.0 lb

## 2019-04-19 DIAGNOSIS — I63511 Cerebral infarction due to unspecified occlusion or stenosis of right middle cerebral artery: Secondary | ICD-10-CM

## 2019-04-19 DIAGNOSIS — G8110 Spastic hemiplegia affecting unspecified side: Secondary | ICD-10-CM

## 2019-04-19 DIAGNOSIS — F1410 Cocaine abuse, uncomplicated: Secondary | ICD-10-CM

## 2019-04-19 MED ORDER — ATORVASTATIN CALCIUM 20 MG PO TABS: 20.0000 mg | ORAL_TABLET | Freq: Every day | ORAL | 1 refills | Status: DC

## 2019-04-19 MED ORDER — TOPIRAMATE 50 MG PO TABS: 100.0000 mg | ORAL_TABLET | Freq: Two times a day (BID) | ORAL | 0 refills | Status: DC

## 2019-04-19 NOTE — Patient Instructions (Signed)
I had a long d/w patient about his recent stroke, cocaine abuse risk for recurrent stroke/TIAs, personally independently reviewed imaging studies and stroke evaluation results and answered questions.Continue Plavix for secondary stroke prevention and maintain strict control of hypertension with blood pressure goal below 130/90, diabetes with hemoglobin A1c goal below 6.5% and lipids with LDL cholesterol goal below 70 mg/dL. I also advised the patient to eat a healthy diet with plenty of whole grains, cereals, fruits and vegetables, exercise regularly and maintain ideal body weight.  Continue ongoing outpatient physical and occupational therapy.  Reduce the dose of Lipitor to 20 mg daily as she does not have elevated lipids check transcranial Doppler bubble study for PFO.  Increase Topamax 200 mg twice daily for migraine prophylaxis and advised her to avoid migraine triggers.  She was counseled to quit cocaine and marijuana as well as cigarette smoking and seems agreeable.  Followup in the future with my nurse practitioner Janett Billow in 3 months or call earlier if necessary.  Stroke Prevention Some medical conditions and behaviors are associated with a higher chance of having a stroke. You can help prevent a stroke by making nutrition, lifestyle, and other changes, including managing any medical conditions you may have. What nutrition changes can be made?   Eat healthy foods. You can do this by: ? Choosing foods high in fiber, such as fresh fruits and vegetables and whole grains. ? Eating at least 5 or more servings of fruits and vegetables a day. Try to fill half of your plate at each meal with fruits and vegetables. ? Choosing lean protein foods, such as lean cuts of meat, poultry without skin, fish, tofu, beans, and nuts. ? Eating low-fat dairy products. ? Avoiding foods that are high in salt (sodium). This can help lower blood pressure. ? Avoiding foods that have saturated fat, trans fat, and  cholesterol. This can help prevent high cholesterol. ? Avoiding processed and premade foods.  Follow your health care provider's specific guidelines for losing weight, controlling high blood pressure (hypertension), lowering high cholesterol, and managing diabetes. These may include: ? Reducing your daily calorie intake. ? Limiting your daily sodium intake to 1,500 milligrams (mg). ? Using only healthy fats for cooking, such as olive oil, canola oil, or sunflower oil. ? Counting your daily carbohydrate intake. What lifestyle changes can be made?  Maintain a healthy weight. Talk to your health care provider about your ideal weight.  Get at least 30 minutes of moderate physical activity at least 5 days a week. Moderate activity includes brisk walking, biking, and swimming.  Do not use any products that contain nicotine or tobacco, such as cigarettes and e-cigarettes. If you need help quitting, ask your health care provider. It may also be helpful to avoid exposure to secondhand smoke.  Limit alcohol intake to no more than 1 drink a day for nonpregnant women and 2 drinks a day for men. One drink equals 12 oz of beer, 5 oz of wine, or 1 oz of hard liquor.  Stop any illegal drug use.  Avoid taking birth control pills. Talk to your health care provider about the risks of taking birth control pills if: ? You are over 30 years old. ? You smoke. ? You get migraines. ? You have ever had a blood clot. What other changes can be made?  Manage your cholesterol levels. ? Eating a healthy diet is important for preventing high cholesterol. If cholesterol cannot be managed through diet alone, you may also need to  take medicines. ? Take any prescribed medicines to control your cholesterol as told by your health care provider.  Manage your diabetes. ? Eating a healthy diet and exercising regularly are important parts of managing your blood sugar. If your blood sugar cannot be managed through diet and  exercise, you may need to take medicines. ? Take any prescribed medicines to control your diabetes as told by your health care provider.  Control your hypertension. ? To reduce your risk of stroke, try to keep your blood pressure below 130/80. ? Eating a healthy diet and exercising regularly are an important part of controlling your blood pressure. If your blood pressure cannot be managed through diet and exercise, you may need to take medicines. ? Take any prescribed medicines to control hypertension as told by your health care provider. ? Ask your health care provider if you should monitor your blood pressure at home. ? Have your blood pressure checked every year, even if your blood pressure is normal. Blood pressure increases with age and some medical conditions.  Get evaluated for sleep disorders (sleep apnea). Talk to your health care provider about getting a sleep evaluation if you snore a lot or have excessive sleepiness.  Take over-the-counter and prescription medicines only as told by your health care provider. Aspirin or blood thinners (antiplatelets or anticoagulants) may be recommended to reduce your risk of forming blood clots that can lead to stroke.  Make sure that any other medical conditions you have, such as atrial fibrillation or atherosclerosis, are managed. What are the warning signs of a stroke? The warning signs of a stroke can be easily remembered as BEFAST.  B is for balance. Signs include: ? Dizziness. ? Loss of balance or coordination. ? Sudden trouble walking.  E is for eyes. Signs include: ? A sudden change in vision. ? Trouble seeing.  F is for face. Signs include: ? Sudden weakness or numbness of the face. ? The face or eyelid drooping to one side.  A is for arms. Signs include: ? Sudden weakness or numbness of the arm, usually on one side of the body.  S is for speech. Signs include: ? Trouble speaking (aphasia). ? Trouble understanding.  T is for  time. ? These symptoms may represent a serious problem that is an emergency. Do not wait to see if the symptoms will go away. Get medical help right away. Call your local emergency services (911 in the [place] Do not drive yourself to the hospital.  Other signs of stroke may include: ? A sudden, severe headache with no known cause. ? Nausea or vomiting. ? Seizure. Where to find more information For more information, visit:  American Stroke Association: www.strokeassociation.org  National Stroke Association: www.stroke.org Summary  You can prevent a stroke by eating healthy, exercising, not smoking, limiting alcohol intake, and managing any medical conditions you may have.  Do not use any products that contain nicotine or tobacco, such as cigarettes and e-cigarettes. If you need help quitting, ask your health care provider. It may also be helpful to avoid exposure to secondhand smoke.  Remember BEFAST for warning signs of stroke. Get help right away if you or a loved one has any of these signs. This information is not intended to replace advice given to you by your health care provider. Make sure you discuss any questions you have with your health care provider. Document Revised: 01/09/2017 Document Reviewed: 03/04/2016 Elsevier Patient Education  2020 [employer].

## 2019-04-19 NOTE — Progress Notes (Signed)
[facility] [address] [place]. [place] [address] [phone]       OFFICE CONSULT NOTE  [name] Date of Birth:  06-27-1995 Medical Record Number:  [id number]   Referring MD: ZackSchwartz  Reason for Referral: Stroke HPI: Jane Watkins is a 24 year old Caucasian lady seen today for initial office consultation visit for stroke.  She is accompanied by her aunt Jane Watkins and history is obtained from them, review of electronic medical records and I personally reviewed imaging films in PACS. She has a past medical history of IV drug abuse with hospitalization in March 2018 with narcotic overdose and hypoxic ischemic encephalopathy requiring intubation with recovery with possibly some mild cognitive impairment.  Patient was found unresponsive on 02/07/2019 after neighbors reported that the patient and her boyfriend had been arguing loudly that day.  Patient was found down at home by her boyfriend EMS were called and gave Narcan with some improvement in mental status.  Last known well was unclear.  NIH stroke scale initially was 14.  MRI scan of the brain showed a moderate sized patchy right middle cerebral artery infarct involving cortex as well as subcortical region CTA of the head and neck showed no significant large vessel intracranial or extracranial stenosis.  2D echo showed normal ejection fraction.  Transesophageal echo was also performed and showed no cardiac source of embolism or PFO.  LDL cholesterol was 35 mg percent hemoglobin A1c was 5.0.  Urine drug screen was positive for marijuana and cocaine.  Patient was admitted to [facility] and transferred to inpatient rehab.  She is done well and gradually improved.  She is living at home with her boyfriend.  She is getting outpatient physical occupational therapy and is able to ambulate independently with just a left foot brace and without even a cane or a walker.  She still has significant weakness in the left  grip and intrinsic hand muscles.  She needs help with the boyfriend to cook as well as shower and dress herself to some degree.  She still continues to smoke cigarettes and marijuana but states she is giving up cocaine.  She does have history of migraine headaches and reports them occurring every other day.  She takes Topamax 75 mg twice daily which helps him prophylaxis.  She takes Tylenol Extra Strength for symptomatic relief.  She has no prior history of deep and thrombosis, pulmonary embolism, stroke in a young age in the family.  She does not mention she has some mild cognitive impairment and short-term memory difficulties since initial hypoxic event from overdose in 2018 in which may have slightly worsened after the current stroke.  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  She is presently on Plavix which is tolerating well without bruising or bleeding and Lipitor 80 mg daily. ROS:   14 system review of systems is positive for weakness, gait difficulty, memory loss and all other systems negative  PMH:  Past Medical History:  Diagnosis Date  . AKI (acute Watkins injury) ([place]) 2018   "from overdose"  . Anxiety   . Daily headache   . Depression   . Drug overdose 04/2016   [name] 05/01/2016  . GERD (gastroesophageal reflux disease)    "when I was younger; gone now" (09/21/2017)  . Migraine    "q couple weeks" (09/21/2017)  . Overdose   . Stroke [facility])     Social History:  Social History   Socioeconomic History  . Marital status: Legally Separated    Spouse name: Not on file  . Number of children: Not on file  . Years of education: Not on file  . Highest education level: Not on file  Occupational History  . Not on file  Tobacco Use  . Smoking status: Current Every Day Smoker    Packs/day: 0.50    Years: 10.00    Pack years: 5.00    Types: Cigarettes  . Smokeless tobacco: Never Used  Substance and Sexual Activity  .  Alcohol use: No  . Drug use: Yes    Types: IV, Heroin, Cocaine    Comment: 09/21/2017 "cocaine a couple times in the last month; clean from heroin for 2 wks before yesterday; been struggling w/that for a couple years now""  . Sexual activity: Yes  Other Topics Concern  . Not on file  Social History Narrative  . Not on file   Social Determinants of Health   Financial Resource Strain:   . Difficulty of Paying Living Expenses: Not on file  Food Insecurity:   . Worried About Programme researcher, broadcasting/film/video in the Last Year: Not on file  . Ran Out of Food in the Last Year: Not on file  Transportation Needs:   . Lack of Transportation (Medical): Not on file  . Lack of Transportation (Non-Medical): Not on file  Physical Activity:   . Days of Exercise per Week: Not on file  . Minutes of Exercise per Session: Not on file  Stress:   . Feeling of Stress : Not on file  Social Connections:   . Frequency of Communication with Friends and Family: Not on file  . Frequency of Social Gatherings with Friends and Family: Not on file  . Attends Religious Services: Not on file  . Active Member of Clubs or Organizations: Not on file  . Attends Banker Meetings: Not on file  . Marital Status: Not on file  Intimate Partner Violence:   . Fear of Current or Ex-Partner: Not on file  . Emotionally Abused: Not on file  . Physically Abused: Not on file  . Sexually Abused: Not on file    Medications:   Current Outpatient Medications on File Prior to Visit  Medication Sig Dispense Refill  . acetaminophen (TYLENOL) 325 MG tablet Take 2 tablets (650 mg total) by mouth every 4 (four) hours as needed for mild pain (or temp > 37.5 C (99.5 F)).    . benzoyl peroxide 5 % external liquid Apply topically 2 (two) times daily.    . busPIRone (BUSPAR) 5 MG tablet Take 1 tablet (5 mg total) by mouth 3 (three) times daily. 60 tablet 1  . clopidogrel (PLAVIX) 75 MG tablet Take 1 tablet (75 mg total) by mouth daily. 30  tablet 0  . FLUoxetine (PROZAC) 40 MG capsule Take 1 capsule (40 mg total) by mouth daily. 30 capsule 3  . pantoprazole (PROTONIX) 40 MG tablet Take 1 tablet (40 mg total) by mouth daily. 30 tablet 0  . QUEtiapine (SEROQUEL) 100 MG tablet Take 1 tablet (100 mg total) by mouth at bedtime. 30 tablet 1  . tiZANidine (ZANAFLEX) 2 MG tablet Take 1 tablet (2 mg total) by mouth every 12 (twelve) hours. 60 tablet 0   Current Facility-Administered Medications on File Prior to Visit  Medication Dose Route Frequency Provider Last Rate Last Admin  . busPIRone (BUSPAR) tablet 5 mg  5 mg Oral BID  Horton Chin, MD        Allergies:   Allergies  Allergen Reactions  . Other Rash    Tide detergent   . Sulfa Antibiotics Rash    Physical Exam General: Petite frail young Caucasian lady, seated, in no evident distress Head: head normocephalic and atraumatic.   Neck: supple with no carotid or supraclavicular bruits Cardiovascular: regular rate and rhythm, no murmurs Musculoskeletal: no deformity Skin:  no rash/petichiae Vascular:  Normal pulses all extremities  Neurologic Exam Mental Status: Awake and fully alert. Oriented to place and time. Recent and remote memory intact. Attention span, concentration and fund of knowledge appropriate. Mood and affect appropriate.  Flat affect Cranial Nerves: Fundoscopic exam reveals sharp disc margins. Pupils equal, briskly reactive to light. Extraocular movements full without nystagmus. Visual fields full to confrontation. Hearing intact. Facial sensation intact.  Moderate left lower facial weakness, tongue, palate moves normally and symmetrically.  Motor: Spastic left hemiparesis with 4/5 strength with significant weakness of left grip and intrinsic hand muscles and orbits right over left upper extremity.  Left foot drop with spasticity in the left side  Sensory.: intact to touch , pinprick , position and vibratory sensation.  Coordination: Mildly impaired  finger-to-nose and neutral coordination. Gait and Station: Arises from chair without difficulty.  Walks with hemiplegic gait with circumduction and left foot drop with an ankle brace.   Reflexes: 2+ and asymmetric and brisker on the left. Toes downgoing.   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  Vascular risk factors of smoking, marijuana and cocaine abuse only.  She has shown substantial improvement but does have residual left spastic hemiplegia     PLAN: I had a long d/w patient about his recent stroke, cocaine abuse risk for recurrent stroke/TIAs, personally independently reviewed imaging studies and stroke evaluation results and answered questions.Continue Plavix for secondary stroke prevention and maintain strict control of hypertension with blood pressure goal below 130/90, diabetes with hemoglobin A1c goal below 6.5% and lipids with LDL cholesterol goal below 70 mg/dL. I also advised the patient to eat a healthy diet with plenty of whole grains, cereals, fruits and vegetables, exercise regularly and maintain ideal body weight.  Continue ongoing outpatient physical and occupational therapy.  Reduce the dose of Lipitor to 20 mg daily as she does not have elevated lipids check transcranial Doppler bubble study for PFO.  Increase Topamax 200 mg twice daily for migraine prophylaxis and advised her to avoid migraine triggers.  She was counseled to quit cocaine and marijuana as well as cigarette smoking and seems agreeable.  Greater than 50% time during this 45-minute consultation visit was spent on counseling and coordination of care about her embolic stroke and discussion about substance abuse and stroke prevention and answering questions followup in the future with my nurse practitioner Jane Watkins in 3 months or call earlier if necessary. Delia Heady, MD  [facility] [address] [place], [place] [address]  Phone [phone] Fax [phone] Note: This document was prepared with digital dictation and possible smart phrase technology. Any transcriptional errors that result from this process are unintentional.

## 2019-04-20 ENCOUNTER — Encounter: Payer: 59 | Admitting: Occupational Therapy

## 2019-04-20 ENCOUNTER — Ambulatory Visit: Payer: 59

## 2019-04-21 ENCOUNTER — Ambulatory Visit: Payer: 59 | Admitting: Occupational Therapy

## 2019-04-21 ENCOUNTER — Other Ambulatory Visit: Payer: Self-pay

## 2019-04-25 ENCOUNTER — Encounter: Payer: Self-pay | Admitting: Occupational Therapy

## 2019-04-25 ENCOUNTER — Other Ambulatory Visit: Payer: Self-pay

## 2019-04-25 ENCOUNTER — Ambulatory Visit: Payer: 59 | Admitting: Occupational Therapy

## 2019-04-25 ENCOUNTER — Ambulatory Visit: Payer: 59

## 2019-04-25 DIAGNOSIS — M6281 Muscle weakness (generalized): Secondary | ICD-10-CM

## 2019-04-25 DIAGNOSIS — R278 Other lack of coordination: Secondary | ICD-10-CM

## 2019-04-25 DIAGNOSIS — R2689 Other abnormalities of gait and mobility: Secondary | ICD-10-CM

## 2019-04-25 NOTE — Therapy (Signed)
[place] [facility] SERVICES [address] [place], [place], [address] Phone: [phone]   Fax:  [phone]  Physical Therapy Progress Note   Dates of reporting period  03/02/19   to   04/25/19  Patient Details  Name: Jane Watkins MRN: [id number] Date of Birth: Aug 22, 1995 Referring Provider (PT): Dr. Earnest Conroy. [name]   Encounter Date: 04/25/2019  PT End of Session - 04/25/19 1626    Visit Number  10    Number of Visits  24    Date for PT Re-Evaluation  05/25/19    Authorization Type  [employer], 60 visit limit per discipline    PT Start Time  1605    PT Stop Time  1650    PT Time Calculation (min)  45 min    Equipment Utilized During Treatment  Gait belt    Activity Tolerance  Patient tolerated treatment well;No increased pain    Behavior During Therapy  Flat affect       Past Medical History:  Diagnosis Date  . AKI (acute kidney injury) ([place]) 2018   "from overdose"  . Anxiety   . Daily headache   . Depression   . Drug overdose 04/2016   [name] 05/01/2016  . GERD (gastroesophageal reflux disease)    "when I was younger; gone now" (09/21/2017)  . Migraine    "q couple weeks" (09/21/2017)  . Overdose   . Stroke [facility])     Past Surgical History:  Procedure Laterality Date  . APPENDECTOMY  04/2013  . I & D EXTREMITY Left 09/20/2017   Procedure: IRRIGATION AND DEBRIDEMENT LEFT ARM;  Surgeon: [name], MD;  Location: [facility];  Service: Orthopedics;  Laterality: Left;  . TEE WITHOUT CARDIOVERSION N/A 02/10/2019   Procedure: TRANSESOPHAGEAL ECHOCARDIOGRAM (TEE);  Surgeon: [name], MD;  Location: [facility];  Service: Cardiovascular;  Laterality: N/A;  Polysubstance abuser  . TONGUE SURGERY  ~ 2007   "related to lisp"  . TRACHEOSTOMY  04/2016   [name] 05/01/2016    There were no vitals filed for this visit.  Subjective Assessment - 04/25/19 1626    Subjective  Pt doing well this date. No specific questions or concerns upon arrival.  Denies pain. No falls since last therapy session. Reports intermittent compliance with HEP.    Pertinent History  Pt is 24 y.o. female presenting to hospital 02/07/19 initially,  MRI showing acute infarct R MCA territory affecting basal ganglia and affecting cortical and subcortical brain in a largely watershed distribution. After in hospital stay patient transitioned to CIR from 02/16/2019-03/01/2019.  PMH includes IV drug abuse, anxiety, depression, migraines, AKI, hypoxic ischemic encephalopathy 2018, opiate overdose 2018, h/o trach 2018, h/o I&D L UE. PLOF: lives with boyfriend in one story apartment, prior to CVA patient was independent, working.    Currently in Pain?  No/denies         [facility] PT Assessment - 04/25/19 1633      6 Minute Walk- Baseline   6 Minute Walk- Baseline  yes    BP (mmHg)  108/66    HR (bpm)  67    02 Sat (%RA)  100 %    Modified Borg Scale for Dyspnea  0- Nothing at all    Perceived Rate of Exertion (Borg)  6-      6 Minute walk- Post Test   6 Minute Walk Post Test  yes    BP (mmHg)  136/73    HR (bpm)  77  02 Sat (%RA)  100 %    Modified Borg Scale for Dyspnea  5- Strong or hard breathing    Perceived Rate of Exertion (Borg)  10-      6 minute walk test results    Aerobic Endurance Distance Walked  1000    Endurance additional comments  AFO on LLE, mask donned, no assistive device        TREATMENT   Ther-ex    FUNCTIONAL OUTCOME MEASURES   Results Comments  5TSTS 7.7s no UE, decreased weight shifting to LLE; WNL  6 Minute Walk Test 1000 ft Goals is >1000'  10 Meter Gait Speed Self-selected: 10.8s = 0.93 m/s; Fastest: 8.8s = 1.14 m/s, no AD Self-selected speed below normative values for full community ambulation     Strength R/L 5/5 Hip flexion 4+/3 Hip extension 4+/2 Hip external rotation 4+/2 Hip internal rotation 4+/4- Hip abduction 5/2 Hip adduction 5/4 Knee extension 5/4 Knee flexion 5/1+ Ankle Plantarflexion 5/0 Ankle  Dorsiflexion 4/0 Ankle eversion 4/0 Ankle inversion    Supine L SLR with cues to initiate with L quad set x 10; Supine manually resisted L leg press x 10; Supine straight leg manually resisted L hip abduction and adduction x 10 each; R sidelying L hip straight let abduction x 10;    Pt educated throughout session about proper posture and technique with exercises. Improved exercise technique, movement at target joints, use of target muscles after min to mod verbal, visual, tactile cues.    Outcome measures and goals updated with patient today. She is demonstrating excellent progress and has made quite remarkable gains since her outcome measures were last performed. Her 5TSTS dropped from 17s to 7.7s and her self-selected 52mgait speed has improved from 0.57 m/s to 0.93 m/s. She is now ambulating without an assistive device. Her 6MWT distance increased from 574 to 1000' and she demonstrates improvement in LLE strength through the hip and knee. Unfortunately she is still unable to active LLE muscles for L ankle dorsiflexion, inversion, or eversion. However she is able to perform some faint plantarflexion at this time. She has an upcoming appointment to start botox injections in her LLE which should help with her L plantarflexor tone and improve ambulation. Pt encouraged to continue HEP and follow-up as scheduled. She will benefit from PT services to address deficits in strength, balance, and mobility in order to return to full function at home.                PT Short Term Goals - 04/25/19 1627      PT SHORT TERM GOAL #1   Title  Pt will be independent with initial HEP in order to indicate improved strength and decreased fall risk.    Baseline  initial HEP administered on eval 03/02/2019; 04/25/19: "not as much as should."    Time  6    Period  Weeks    Status  Partially Met    Target Date  04/13/19        PT Long Term Goals - 04/25/19 1628      PT LONG TERM GOAL #1   Title   Pt will be independent with final HEP in order to indicate improved strength and decreased fall risk.    Baseline  initial HEP administered on eval 03/02/19; 04/25/19: "not as much as a should."    Time  12    Period  Weeks    Status  On-going    Target Date  05/25/19      PT LONG TERM GOAL #2   Title  Pt will decrease 5TSTS to less than 10 seconds in order to demonstrate clinically significant improvement in LE strength and functional mobility.    Baseline  03/02/2019: 17 seconds without UE support, dependent on RLE, very little weight shift to LLE; 04/25/19: 7.7s, no UE, decreased weight shifting to LLE;    Time  12    Period  Weeks    Status  Achieved      PT LONG TERM GOAL #3   Title  Pt will perform 6MWT > 1064f with LRAD at mod I level in order to indicate safe community/leisure negotiation.    Baseline  03/02/2019 534f 04/25/19: 1000'    Time  12    Period  Weeks    Status  Partially Met    Target Date  05/25/19      PT LONG TERM GOAL #4   Title  The patient will demonstrate at least 1/2 grade improvement in LE MMT grade to improve ability to perform functional activities.    Baseline  see eval for details 03/02/2019; 04/25/19: see visit note, significant improvement, still lacking L anikle dorsiflexion, eversion, or inversion    Time  12    Period  Weeks    Status  Partially Met    Target Date  05/25/19      PT LONG TERM GOAL #5   Title  Pt will ambulate >1 m/s with LRAD mod I to indicate gait velocity of community ambulator.    Baseline  03/02/19: self selected: .57 m/s, fastest .7220mwith quad cane; 04/25/19: Self-selected: 10.8s = 0.93 m/s; Fastest: 8.8s = 1.14 m/s, performed with no AD    Time  12    Period  Weeks    Status  Partially Met    Target Date  05/25/19            Plan - 04/25/19 1626    Clinical Impression Statement  Outcome measures and goals updated with patient today. She is demonstrating excellent progress and has made quite remarkable gains since  her outcome measures were last performed. Her 5TSTS dropped from 17s to 7.7s and her self-selected 42m5mt speed has improved from 0.57 m/s to 0.93 m/s. She is now ambulating without an assistive device. Her 6MWT distance increased from 530'49 1000' and she demonstrates improvement in LLE strength through the hip and knee. Unfortunately she is still unable to active LLE muscles for L ankle dorsiflexion, inversion, or eversion. However she is able to perform some faint plantarflexion at this time. She has an upcoming appointment to start botox injections in her LLE which should help with her L plantarflexor tone and improve ambulation. Pt encouraged to continue HEP and follow-up as scheduled. She will benefit from PT services to address deficits in strength, balance, and mobility in order to return to full function at home.    Personal Factors and Comorbidities  Behavior Pattern    Examination-Activity Limitations  Bathing;Dressing;Sit;Transfers;Sleep;Bed Mobility;Bend;Caring for Others;Carry;Toileting;Reach Overhead;Stand;Locomotion Level;Stairs;Squat;Lift;Hygiene/Grooming    Examination-Participation Restrictions  Church;Interpersonal Relationship;Personal Finances;Yard Work;Cleaning;Laundry;School;Community Activity;Medication Management;Driving;Meal Prep;Shop    Rehab Potential  Good    Clinical Impairments Affecting Rehab Potential  unsure of severity of cognitive/psych deficits    PT Frequency  2x / week    PT Duration  12 weeks    PT Treatment/Interventions  ADLs/Self Care Home Management;DME Instruction;Gait training;Stair training;Functional mobility training;Therapeutic activities;Therapeutic exercise;Balance training;Neuromuscular re-education;Cognitive remediation;Patient/family education;Vestibular;Energy conservation;Cryotherapy;Ultrasound;Electrical Stimulation;Fluidtherapy;Iontophoresis  73m/ml Dexamethasone;Canalith Repostioning;Moist Heat;Traction;Passive range of motion;Manual  techniques;Joint Manipulations;Spinal Manipulations;Splinting;Taping;Scar mobilization;Wheelchair mobility training;Prosthetic Training;Orthotic Fit/Training;Dry needling;Visual/perceptual remediation/compensation;Aquatic Therapy    PT Next Visit Plan  continue with LE strengthening, NME, and motor control activities    PT Home Exercise Plan  Access Code: [email or web address]    Consulted and Agree with Plan of Care  Patient       Patient will benefit from skilled therapeutic intervention in order to improve the following deficits and impairments:  Decreased activity tolerance, Decreased balance, Decreased cognition, Decreased endurance, Decreased knowledge of use of DME, Decreased mobility, Decreased strength, Impaired perceived functional ability, Postural dysfunction, Abnormal gait, Difficulty walking, Impaired tone, Decreased range of motion, Decreased coordination, Impaired UE functional use, Pain  Visit Diagnosis: Muscle weakness (generalized)  Other abnormalities of gait and mobility     Problem List Patient Active Problem List   Diagnosis Date Noted  . Spastic hemiparesis ([facility]   . Elevated BUN   . Vascular headache   . Mood disorder in conditions classified elsewhere   . Right middle cerebral artery stroke ([place] 02/16/2019  . Polysubstance abuse ([place]   . Dyslipidemia   . Ischemic stroke diagnosed during current admission ([place] 02/13/2019  . MDD (major depressive disorder), recurrent episode, moderate ([place] 02/10/2019  . Dysphagia 02/09/2019  . Elevated CK 02/09/2019  . CVA (cerebral vascular accident) ([place] 02/07/2019  . Cellulitis of arm 09/20/2017  . IV drug abuse ([place] 09/20/2017  . Quadriceps weakness 06/18/2016  . TBI (traumatic brain injury) ([place] 05/01/2016  . 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 Respiratory distress   . Glasgow coma scale total score 3-8 ([place]   . Pneumonia of both lower lobes due to methicillin resistant Staphylococcus aureus (MRSA) ([place]   .  Acute pulmonary edema (HCC)   . Non-traumatic rhabdomyolysis   . Opioid abuse ([place]   . Elevated troponin   . Acute respiratory failure with hypoxia ([place]   . Drug overdose   . Elevated liver enzymes   . Encephalopathy acute   . Encephalopathy 04/19/2016  . Opiate overdose ([place] 04/19/2016   JPhillips GroutPT, DPT, GCS  Tareka Jhaveri 04/26/2019, 10:57 AM  [facility] [address] [address]Phone: [phone]  Fax:  [phone] Name: LZissy HamlettMRN: [id number] of Birth: 605/27/1997

## 2019-04-25 NOTE — Therapy (Signed)
[place] MAIN [facility] SERVICES [address] [place], [place], [address] Phone: [phone]   Fax:  [phone]  Occupational Therapy Progress Note  Dates of reporting period  03/07/2019   to   04/25/2019  Patient Details  Name: Jane Watkins MRN: [id number] Date of Birth: 1995-12-18 Referring Provider (OT): Dr. [name]   Encounter Date: 04/25/2019  OT End of Session - 04/25/19 1527    Visit Number  10    Number of Visits  24    Date for OT Re-Evaluation  05/30/19    Authorization Type  Progress report period starting 03/07/2019    OT Start Time  1518    OT Stop Time  1600    OT Time Calculation (min)  42 min    Activity Tolerance  Patient tolerated treatment well    Behavior During Therapy  Flat affect       Past Medical History:  Diagnosis Date  . AKI (acute kidney injury) ([place]) 2018   "from overdose"  . Anxiety   . Daily headache   . Depression   . Drug overdose 04/2016   [name] 05/01/2016  . GERD (gastroesophageal reflux disease)    "when I was younger; gone now" (09/21/2017)  . Migraine    "q couple weeks" (09/21/2017)  . Overdose   . Stroke [facility])     Past Surgical History:  Procedure Laterality Date  . APPENDECTOMY  04/2013  . I & D EXTREMITY Left 09/20/2017   Procedure: IRRIGATION AND DEBRIDEMENT LEFT ARM;  Surgeon: [name], MD;  Location: [place];  Service: Orthopedics;  Laterality: Left;  . TEE WITHOUT CARDIOVERSION N/A 02/10/2019   Procedure: TRANSESOPHAGEAL ECHOCARDIOGRAM (TEE);  Surgeon: [name], MD;  Location: [facility];  Service: Cardiovascular;  Laterality: N/A;  Polysubstance abuser  . TONGUE SURGERY  ~ 2007   "related to lisp"  . TRACHEOSTOMY  04/2016   [name] 05/01/2016    There were no vitals filed for this visit.  Subjective Assessment - 04/25/19 1525    Subjective   Pt. reports that she had a good weekend.    Pertinent History  Per chart. Pt. is a 24 y.o.female with a history of IV drug use,  polysubstance as well as tobacco abuse, anxiety. Pt. presented to [facility] on 02/07/2019 after being found unresponsive.  EMS contacted patient received Narcan with some improvement in mental status.  Admission labs with WBC 20,700, potassium 5.2, BUN 25, creatinine 0.67, urine drug screen positive cocaine, marijuana and benzodiazepines, alcohol level less than 10, SARS coronavirus negative.  Cranial CT scan showed areas of indistinct low-density in the right basal ganglia with mass-effect on the right lateral ventricle.  Patient did not receive TPA.  MRI showed acute infarction of right MCA territory, affecting the basal ganglia and affecting the cortical and subcortical brain in a largely watershed distribution. Pt. had a previous inpatient rehab admission 05/01/2016 to 05/09/2016 for hypoxic encephalopathy after drug overdose requiring tracheostomy, and was later decannulated. Pt. resides with her boyfriend in an apartment. 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    Patient Stated Goals  Patient reports she would like to be as independent as possible with all her daily tasks.    Currently in Pain?  No/denies         [facility] OT Assessment - 04/25/19 1549      AROM   Overall AROM Comments  Left Shoulder flexion 132, abduction 95, elbow 10-140, wrist  extension 54, wrist flexion 54 degrees       Hand Function   Right Hand Grip (lbs)  43    Right Hand Lateral Pinch  13 lbs    Right Hand 3 Point Pinch  13 lbs    Left Hand Grip (lbs)  10    Left Hand Lateral Pinch  5 lbs    Left 3 point pinch  3 lbs      Measurements were obtained, and goals were reviewed with the pt.   OT TREATMENT    Therapeutic Exercise:  Pt. worked on pinch strengthening in the left hand for lateral, and 3pt. pinch using yellow, and red resistive clips. Pt. worked on placing the clips onto a horizontal dowel. Tactile and verbal cues were required for eliciting the desired movement. Pt. worked on  picking them up off of the table, and positioning the clips in preparation to be placed on the dowels.  Pt. is making excellent progress with her LUE, and hand. Pt. is starting to use her left hand to hold, and apply deodorant to the right axilla region. Pt. is able to use her left hand to assist with washing her RUE, and is able to hold a toothbrush steady while applying toothpaste. Pt. Has made excellent progress with all active ranges in the left shoulder, elbow, and wrist. Pt. was able to actively engage her left hand in grip strength, pinch strength, and FMC testing in order to establish baseline measurements. Pt. Continues to work on improving her LUE functioning in order to work towards using her left hand to engage in, and perform ADLs, and IADL tasks.                  OT Education - 04/25/19 1527    Education Details  ROM and shoulder stabilization exercises    Person(s) Educated  Patient    Methods  Explanation;Demonstration;Tactile cues;Verbal cues;Handout    Comprehension  Verbalized understanding;Returned demonstration;Need further instruction          OT Long Term Goals - 04/25/19 1529      OT LONG TERM GOAL #1   Title  Pt. will increase active isolated left shoulder flexion by 20 degrees in preparation for combing her hair.    Baseline  04/25/2019: Shoulder flexion 132. Pt. is unable to use her left hand to brush her hair. Eval: No isolated shoulder shoulder flexion. Pt. compensates with synergystic movement with attempts for flexion.    Time  12    Period  Weeks    Status  Partially Met    Target Date  05/30/19      OT LONG TERM GOAL #2   Title  Pt. will increase active shoulder abduction by 20 degrees in preparation to be able to wash her hair.    Baseline  04/25/2019: shoulder abduction:95. Pt. is unable to use her LUE to wash her hair. Eval: Active shoulder abduction 56 degrees.    Time  12    Period  Weeks    Status  Partially Met    Target Date   05/30/19      OT LONG TERM GOAL #3   Title  Pt. will improve hand to face patterns using her left hand to be able to independently wash her face.    Baseline  04/25/2019: Pt. is able to perfrom hand to face patterns with her left hand, however is not able to use for left hand to wash her face.Eval: Pt. is unable  to wash her face using her left hand    Time  12    Period  Weeks    Status  Partially Met    Target Date  05/30/19      OT LONG TERM GOAL #4   Title  Pt. will increase AROM wrist extension by 10 degrees in preparation for reaching for a cup    Baseline  04/25/2019: Wrist extension 54. Pt. conitnues to work to reaching for a cup with her left hand. Eval: 0 degrees of active wrist extension.    Time  12    Period  Weeks    Status  Partially Met    Target Date  06/09/19      OT LONG TERM GOAL #5   Title  Pt. will increase left digit flexion to be able to initiate actively holding a toothbrush.    Baseline  04/25/2019: Pt. is indpendently able to hold, and stabilize a toorhbursh with her left hand while applying toothpaste. pt. is unable to use her left hand to brush her teeth. Eval: Pt. is unable to use her left hand to hold a toothbrush.    Time  12    Period  Weeks    Status  Partially Met    Target Date  05/30/19            Plan - 04/25/19 1527    Clinical Impression Statement  Pt. is making excellent progress with her LUE, and hand. Pt. is starting to use her left hand to hold, and apply deodorant to the right axilla region. Pt. is able to use her left hand to assist with washing her RUE, and is able to hold a toothbrush steady while applying toothpaste. Pt. Has made excellent progress with all active ranges in the left shoulder, elbow, and wrist. Pt. was able to actively engage her left hand in grip strength, pinch strength, and FMC testing in order to establish baseline measurements. Pt. Continues to work on improving her LUE functioning in order to work towards using her  left hand to engage in, and perform ADLs, and IADL tasks.   OT Occupational Profile and History  Detailed Assessment- Review of Records and additional review of physical, cognitive, psychosocial history related to current functional performance    Occupational performance deficits (Please refer to evaluation for details):  ADL's;IADL's    Body Structure / Function / Physical Skills  ADL;IADL;Coordination;Endurance;UE functional use;Decreased knowledge of precautions;Dexterity;FMC;ROM;Tone;Strength    Rehab Potential  Good    Clinical Decision Making  Several treatment options, min-mod task modification necessary    Comorbidities Affecting Occupational Performance:  May have comorbidities impacting occupational performance    Modification or Assistance to Complete Evaluation   Min-Moderate modification of tasks or assist with assess necessary to complete eval    OT Frequency  2x / week    OT Duration  12 weeks    OT Treatment/Interventions  Self-care/ADL training;DME and/or AE instruction;Energy conservation;Therapeutic activities;Patient/family education;Therapeutic exercise;Neuromuscular education    Consulted and Agree with Plan of Care  Patient       Patient will benefit from skilled therapeutic intervention in order to improve the following deficits and impairments:   Body Structure / Function / Physical Skills: ADL, IADL, Coordination, Endurance, UE functional use, Decreased knowledge of precautions, Dexterity, FMC, ROM, Tone, Strength       Visit Diagnosis: Muscle weakness (generalized)  Other lack of coordination    Problem List Patient Active Problem List   Diagnosis Date  Noted  . Spastic hemiparesis ([place])   . Elevated BUN   . Vascular headache   . Mood disorder in conditions classified elsewhere   . 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 Polysubstance abuse ([place])   . Dyslipidemia   . Ischemic stroke diagnosed during current admission ([place]) 02/13/2019   . MDD (major depressive disorder), recurrent episode, moderate ([place]) 02/10/2019  . Dysphagia 02/09/2019  . Elevated CK 02/09/2019  . CVA (cerebral vascular accident) ([place]) 02/07/2019  . Cellulitis of arm 09/20/2017  . IV drug abuse ([place]) 09/20/2017  . Quadriceps weakness 06/18/2016  . TBI (traumatic brain injury) ([place]) 05/01/2016  . 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 Respiratory distress   . Glasgow coma scale total score 3-8 ([place])   . Pneumonia of both lower lobes due to methicillin resistant Staphylococcus aureus (MRSA) ([place])   . Acute pulmonary edema ([facility])   . Non-traumatic rhabdomyolysis   . Opioid abuse ([place])   . Elevated troponin   . Acute respiratory failure with hypoxia ([place])   . Drug overdose   . Elevated liver enzymes   . Encephalopathy acute   . Encephalopathy 04/19/2016  . Opiate overdose ([place]) 04/19/2016    Harrel Carina, MS, OTR/L 04/25/2019, 4:19 PM  [place] MAIN [facility] SERVICES [address] [place], [place], [address] Phone: [phone]   Fax:  [phone]  Name: Lynnex Fulp MRN: [id number] Date of Birth: 23-Feb-1995

## 2019-04-27 ENCOUNTER — Encounter: Payer: 59 | Admitting: Occupational Therapy

## 2019-04-27 ENCOUNTER — Ambulatory Visit: Payer: 59

## 2019-04-27 ENCOUNTER — Telehealth: Payer: Self-pay

## 2019-04-27 NOTE — Telephone Encounter (Signed)
Fax from pharmacy requesting a refill on Quetiapine Fumarate 25 mg. Has been increased and new script sent in.

## 2019-04-28 ENCOUNTER — Ambulatory Visit: Payer: 59 | Admitting: Occupational Therapy

## 2019-04-28 ENCOUNTER — Other Ambulatory Visit: Payer: Self-pay | Admitting: Physical Medicine and Rehabilitation

## 2019-04-28 ENCOUNTER — Encounter: Payer: Self-pay | Admitting: Physical Therapy

## 2019-04-28 ENCOUNTER — Ambulatory Visit: Payer: 59 | Admitting: Physical Therapy

## 2019-04-28 ENCOUNTER — Other Ambulatory Visit: Payer: Self-pay

## 2019-04-28 ENCOUNTER — Encounter: Payer: Self-pay | Admitting: Occupational Therapy

## 2019-04-28 DIAGNOSIS — M6281 Muscle weakness (generalized): Secondary | ICD-10-CM | POA: Diagnosis not present

## 2019-04-28 DIAGNOSIS — M25512 Pain in left shoulder: Secondary | ICD-10-CM

## 2019-04-28 DIAGNOSIS — I639 Cerebral infarction, unspecified: Secondary | ICD-10-CM

## 2019-04-28 DIAGNOSIS — I63511 Cerebral infarction due to unspecified occlusion or stenosis of right middle cerebral artery: Secondary | ICD-10-CM

## 2019-04-28 DIAGNOSIS — R2689 Other abnormalities of gait and mobility: Secondary | ICD-10-CM

## 2019-04-28 DIAGNOSIS — R278 Other lack of coordination: Secondary | ICD-10-CM

## 2019-04-28 MED ORDER — CLOPIDOGREL BISULFATE 75 MG PO TABS: 75.0000 mg | ORAL_TABLET | Freq: Every day | ORAL | 0 refills | Status: DC

## 2019-04-28 MED ORDER — TIZANIDINE HCL 2 MG PO TABS: 2.0000 mg | ORAL_TABLET | Freq: Two times a day (BID) | ORAL | 0 refills | Status: DC

## 2019-04-28 NOTE — Therapy (Signed)
[place] MAIN [facility] SERVICES [address] [place], [place], [address] Phone: [phone]   Fax:  [phone]  Occupational Therapy Treatment  Patient Details  Name: Jane Watkins MRN: [id number] Date of Birth: [birth year] Referring Provider (OT): Dr. [name]   Encounter Date: 04/28/2019  OT End of Session - 04/28/19 1025    Visit Number  11    Number of Visits  24    Date for OT Re-Evaluation  05/30/19    Authorization Type  Progress report period starting 03/07/2019    OT Start Time  1017    OT Stop Time  1100    OT Time Calculation (min)  43 min    Activity Tolerance  Patient tolerated treatment well    Behavior During Therapy  Flat affect       Past Medical History:  Diagnosis Date  . AKI (acute kidney injury) ([place]) 2018   "from overdose"  . Anxiety   . Daily headache   . Depression   . Drug overdose 04/2016   [name] 05/01/2016  . GERD (gastroesophageal reflux disease)    "when I was younger; gone now" (09/21/2017)  . Migraine    "q couple weeks" (09/21/2017)  . Overdose   . Stroke [facility])     Past Surgical History:  Procedure Laterality Date  . APPENDECTOMY  04/2013  . I & D EXTREMITY Left 09/20/2017   Procedure: IRRIGATION AND DEBRIDEMENT LEFT ARM;  Surgeon: [name], MD;  Location: [place];  Service: Orthopedics;  Laterality: Left;  . TEE WITHOUT CARDIOVERSION N/A 02/10/2019   Procedure: TRANSESOPHAGEAL ECHOCARDIOGRAM (TEE);  Surgeon: [name], MD;  Location: [facility] ORS;  Service: Cardiovascular;  Laterality: N/A;  Polysubstance abuser  . TONGUE SURGERY  ~ 2007   "related to lisp"  . TRACHEOSTOMY  04/2016   [name] 05/01/2016    There were no vitals filed for this visit.  Subjective Assessment - 04/28/19 1024    Subjective   Pt. reports that she had a good weekend.    Pertinent History  Per chart. Pt. is a 24 y.o.female with a history of IV drug use, polysubstance as well as tobacco abuse, anxiety. Pt. presented to  [facility] on 02/07/2019 after being found unresponsive.  EMS contacted patient received Narcan with some improvement in mental status.  Admission labs with WBC 20,700, potassium 5.2, BUN 25, creatinine 0.67, urine drug screen positive cocaine, marijuana and benzodiazepines, alcohol level less than 10, SARS coronavirus negative.  Cranial CT scan showed areas of indistinct low-density in the right basal ganglia with mass-effect on the right lateral ventricle.  Patient did not receive TPA.  MRI showed acute infarction of right MCA territory, affecting the basal ganglia and affecting the cortical and subcortical brain in a largely watershed distribution. Pt. had a previous inpatient rehab admission 05/01/2016 to 05/09/2016 for hypoxic encephalopathy after drug overdose requiring tracheostomy, and was later decannulated. Pt. resides with her boyfriend in an apartment. 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    Patient Stated Goals  Patient reports she would like to be as independent as possible with all her daily tasks.    Currently in Pain?  Yes    Pain Score  6    At the end range when moving the shapes up vertical dowels only.   Pain Location  Shoulder    Pain Orientation  Left    Pain Descriptors / Indicators  Sore  OT TREATMENT    Neuro muscular re-education:  Pt. worked on reaching using the shape tower. Pt. grasped and moved the shapes through 4 vertical dowels of varying heights using her left hand. Pt. worked on moving 2 squares up the first vertical post. Pt. moved the shapes from the table base to the first rung. Pt. performed the task while sitting. Pt. required increased time to reposition her hand, and fingers on each shape. Pt. worked on left hand [facility] skills to grasp 1", 3/4,", and 1/2" flat washers from a magnetic dish. Pt. worked on reaching up and placing them onto vertical, and diagonal dowels. Pt. dropped 1" washers when attempting to store them one at a  time in the palm of her hand.   Pt. continues to make steady progress. Pt. is improving with LUE functioning, and is now engaging her hand in more tasks at home. Pt. is initiating grasping with her 2nd digit, and thumb, however continues to present with flexor tone in the left thumb limiting her ability to grasp using lateral, 2pt. and 3pt. pinch grasp patterns.  Pt. continues to work on improving LUE functioning in order to improve functional reaching, and normalizing tone to improve grasping patterns, and increase engagement during ADLs, and IADL tasks. .                        OT Education - 04/28/19 1025    Education Details  ROM and shoulder stabilization exercises    Person(s) Educated  Patient    Methods  Explanation;Demonstration;Tactile cues;Verbal cues;Handout    Comprehension  Verbalized understanding;Returned demonstration;Need further instruction          OT Long Term Goals - 04/25/19 1529      OT LONG TERM GOAL #1   Title  Pt. will increase active isolated left shoulder flexion by 20 degrees in preparation for combing her hair.    Baseline  04/25/2019: Shoulder flexion 132. Pt. is unable to use her left hand to brush her hair. Eval: No isolated shoulder shoulder flexion. Pt. compensates with synergystic movement with attempts for flexion.    Time  12    Period  Weeks    Status  Partially Met    Target Date  05/30/19      OT LONG TERM GOAL #2   Title  Pt. will increase active shoulder abduction by 20 degrees in preparation to be able to wash her hair.    Baseline  04/25/2019: shoulder abduction:95. Pt. is unable to use her LUE to wash her hair. Eval: Active shoulder abduction 56 degrees.    Time  12    Period  Weeks    Status  Partially Met    Target Date  05/30/19      OT LONG TERM GOAL #3   Title  Pt. will improve hand to face patterns using her left hand to be able to independently wash her face.    Baseline  04/25/2019: Pt. is able to perfrom  hand to face patterns with her left hand, however is not able to use for left hand to wash her face.Eval: Pt. is unable to wash her face using her left hand    Time  12    Period  Weeks    Status  Partially Met    Target Date  05/30/19      OT LONG TERM GOAL #4   Title  Pt. will increase AROM wrist extension by 10 degrees  in preparation for reaching for a cup    Baseline  04/25/2019: Wrist extension 54. Pt. conitnues to work to reaching for a cup with her left hand. Eval: 0 degrees of active wrist extension.    Time  12    Period  Weeks    Status  Partially Met    Target Date  06/09/19      OT LONG TERM GOAL #5   Title  Pt. will increase left digit flexion to be able to initiate actively holding a toothbrush.    Baseline  04/25/2019: Pt. is indpendently able to hold, and stabilize a toorhbursh with her left hand while applying toothpaste. pt. is unable to use her left hand to brush her teeth. Eval: Pt. is unable to use her left hand to hold a toothbrush.    Time  12    Period  Weeks    Status  Partially Met    Target Date  05/30/19            Plan - 04/28/19 1027    Clinical Impression Statement Pt. continues to make steady progress. Pt. is improving with LUE functioning, and is now engaging her hand in more tasks at home. Pt. is initiating grasping with her 2nd digit, and thumb, however continues to present with flexor tone in the left thumb limiting her ability to grasp using lateral, 2pt. and 3pt. pinch grasp patterns.  Pt. continues to work on improving LUE functioning in order to improve functional reaching, and normalizing tone to improve grasping patterns, and increase engagement during ADLs, and IADL tasks.    OT Occupational Profile and History  Detailed Assessment- Review of Records and additional review of physical, cognitive, psychosocial history related to current functional performance    Occupational performance deficits (Please refer to evaluation for details):   ADL's;IADL's    Body Structure / Function / Physical Skills  ADL;IADL;Coordination;Endurance;UE functional use;Decreased knowledge of precautions;Dexterity;FMC;ROM;Tone;Strength    Rehab Potential  Good    Clinical Decision Making  Several treatment options, min-mod task modification necessary    Comorbidities Affecting Occupational Performance:  May have comorbidities impacting occupational performance    Modification or Assistance to Complete Evaluation   Min-Moderate modification of tasks or assist with assess necessary to complete eval    OT Frequency  2x / week    OT Duration  12 weeks    OT Treatment/Interventions  Self-care/ADL training;DME and/or AE instruction;Energy conservation;Therapeutic activities;Patient/family education;Therapeutic exercise;Neuromuscular education    Consulted and Agree with Plan of Care  Patient       Patient will benefit from skilled therapeutic intervention in order to improve the following deficits and impairments:   Body Structure / Function / Physical Skills: ADL, IADL, Coordination, Endurance, UE functional use, Decreased knowledge of precautions, Dexterity, FMC, ROM, Tone, Strength       Visit Diagnosis: Muscle weakness (generalized)  Other lack of coordination    Problem List Patient Active Problem List   Diagnosis Date Noted  . Spastic hemiparesis ([place])   . Elevated BUN   . Vascular headache   . Mood disorder in conditions classified elsewhere   . 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 Polysubstance abuse ([place])   . Dyslipidemia   . Ischemic stroke diagnosed during current admission ([place]) 02/13/2019  . MDD (major depressive disorder), recurrent episode, moderate ([place]) 02/10/2019  . Dysphagia 02/09/2019  . Elevated CK 02/09/2019  . CVA (cerebral vascular accident) ([place]) 02/07/2019  . Cellulitis of arm 09/20/2017  .  IV drug abuse ([place]) 09/20/2017  . Quadriceps weakness 06/18/2016  . TBI (traumatic brain injury) ([place])  05/01/2016  . 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 Respiratory distress   . Glasgow coma scale total score 3-8 ([place])   . Pneumonia of both lower lobes due to methicillin resistant Staphylococcus aureus (MRSA) ([place])   . Acute pulmonary edema ([facility])   . Non-traumatic rhabdomyolysis   . Opioid abuse ([place])   . Elevated troponin   . Acute respiratory failure with hypoxia ([place])   . Drug overdose   . Elevated liver enzymes   . Encephalopathy acute   . Encephalopathy 04/19/2016  . Opiate overdose ([place]) 04/19/2016    Harrel Carina, MS, OTR/L 04/28/2019, 10:49 AM  [place] MAIN [facility] SERVICES [address] [place], [place], [address] Phone: [phone]   Fax:  [phone]  Name: Jane Watkins MRN: [id number] Date of Birth: [birth year]

## 2019-04-28 NOTE — Therapy (Signed)
[place] MAIN [facility] SERVICES [address] [place], [place], [address] Phone: [phone]   Fax:  [phone]  Physical Therapy Treatment  Patient Details  Name: Jane Watkins MRN: [id number] Date of Birth: 05-Nov-1995 Referring Provider (PT): Dr. Earnest Conroy. [name]   Encounter Date: 04/28/2019  PT End of Session - 04/28/19 1057    Visit Number  11    Number of Visits  24    Date for PT Re-Evaluation  05/25/19    Authorization Type  [employer], 60 visit limit per discipline    PT Start Time  1105    PT Stop Time  1145    PT Time Calculation (min)  40 min    Equipment Utilized During Treatment  Gait belt    Activity Tolerance  Patient tolerated treatment well;No increased pain    Behavior During Therapy  Flat affect       Past Medical History:  Diagnosis Date  . AKI (acute kidney injury) ([place]) 2018   "from overdose"  . Anxiety   . Daily headache   . Depression   . Drug overdose 04/2016   [name] 05/01/2016  . GERD (gastroesophageal reflux disease)    "when I was younger; gone now" (09/21/2017)  . Migraine    "q couple weeks" (09/21/2017)  . Overdose   . Stroke [facility])     Past Surgical History:  Procedure Laterality Date  . APPENDECTOMY  04/2013  . I & D EXTREMITY Left 09/20/2017   Procedure: IRRIGATION AND DEBRIDEMENT LEFT ARM;  Surgeon: [name], MD;  Location: [place];  Service: Orthopedics;  Laterality: Left;  . TEE WITHOUT CARDIOVERSION N/A 02/10/2019   Procedure: TRANSESOPHAGEAL ECHOCARDIOGRAM (TEE);  Surgeon: [name], MD;  Location: [facility];  Service: Cardiovascular;  Laterality: N/A;  Polysubstance abuser  . TONGUE SURGERY  ~ 2007   "related to lisp"  . TRACHEOSTOMY  04/2016   [name] 05/01/2016    There were no vitals filed for this visit.  Subjective Assessment - 04/28/19 1057    Subjective  Pt doing well this date. No specific questions or concerns upon arrival. Denies pain. No falls since last therapy session. Reports  intermittent compliance with HEP.    Pertinent History  Pt is 24 y.o. female presenting to hospital 02/07/19 initially,  MRI showing acute infarct R MCA territory affecting basal ganglia and affecting cortical and subcortical brain in a largely watershed distribution. After in hospital stay patient transitioned to CIR from 02/16/2019-03/01/2019.  PMH includes IV drug abuse, anxiety, depression, migraines, AKI, hypoxic ischemic encephalopathy 2018, opiate overdose 2018, h/o trach 2018, h/o I&D L UE. PLOF: lives with boyfriend in one story apartment, prior to CVA patient was independent, working.        Treatment: Single leg bridge with RLE raised 6 inches x 10 hooklying BLE hip flex with manual resistance x 15 hooklying ER/abd with BTB x 20 Supine hip abd with assist to not allow ER x 15 x 2 Sideling hip abd x 10 , patient blocked from excessive hip flex and decreased height due to weakness of L hip abd Quadriped AI and RS x 5 mins Quadriped with BUE elbows on elevated bolster for comfort and BLE hip extension x 10 left and right  Kneeling and BUE flex with dowl rod 2 lbs x 20 , trunk rotation and dowl rod x 20 left and right 1/2 Kneeling and BUE flex with dowl rod 2 lbs x 20 , trunk rotation and dowl rod  x 20 left and right Prone left knee flex x 10  PROM to left ankle x 10 AAROM to left ankle x 10  Pt educated throughout session about proper posture and technique with exercises. Improved exercise technique, movement at target joints, use of target muscles after min to mod verbal, visual, tactile cues.                       PT Education - 04/28/19 1057    Education provided  Yes    Education Details  LE strengtheing and HEP    Person(s) Educated  Patient    Methods  Explanation    Comprehension  Verbalized understanding;Need further instruction       PT Short Term Goals - 04/25/19 1627      PT SHORT TERM GOAL #1   Title  Pt will be independent with initial HEP in  order to indicate improved strength and decreased fall risk.    Baseline  initial HEP administered on eval 03/02/2019; 04/25/19: "not as much as should."    Time  6    Period  Weeks    Status  Partially Met    Target Date  04/13/19        PT Long Term Goals - 04/25/19 1628      PT LONG TERM GOAL #1   Title  Pt will be independent with final HEP in order to indicate improved strength and decreased fall risk.    Baseline  initial HEP administered on eval 03/02/19; 04/25/19: "not as much as a should."    Time  12    Period  Weeks    Status  On-going    Target Date  05/25/19      PT LONG TERM GOAL #2   Title  Pt will decrease 5TSTS to less than 10 seconds in order to demonstrate clinically significant improvement in LE strength and functional mobility.    Baseline  03/02/2019: 17 seconds without UE support, dependent on RLE, very little weight shift to LLE; 04/25/19: 7.7s, no UE, decreased weight shifting to LLE;    Time  12    Period  Weeks    Status  Achieved      PT LONG TERM GOAL #3   Title  Pt will perform 6MWT > 1092f with LRAD at mod I level in order to indicate safe community/leisure negotiation.    Baseline  03/02/2019 534f 04/25/19: 1000'    Time  12    Period  Weeks    Status  Partially Met    Target Date  05/25/19      PT LONG TERM GOAL #4   Title  The patient will demonstrate at least 1/2 grade improvement in LE MMT grade to improve ability to perform functional activities.    Baseline  see eval for details 03/02/2019; 04/25/19: see visit note, significant improvement, still lacking L anikle dorsiflexion, eversion, or inversion    Time  12    Period  Weeks    Status  Partially Met    Target Date  05/25/19      PT LONG TERM GOAL #5   Title  Pt will ambulate >1 m/s with LRAD mod I to indicate gait velocity of community ambulator.    Baseline  03/02/19: self selected: .57 m/s, fastest .7234mwith quad cane; 04/25/19: Self-selected: 10.8s = 0.93 m/s; Fastest: 8.8s = 1.14 m/s,  performed with no AD    Time  12  Period  Weeks    Status  Partially Met    Target Date  05/25/19            Plan - 04/28/19 1059    Clinical Impression Statement  Patient instructed in intermediate strengthening challenges open and closed chain.  Patient required min-mod VCs for correct positioning; Patient had increased difficulty with kneeling, 1/2 kneeling and quadriped task movements especially unsupported. Patient would benefit from additional skilled PT intervention to improve strength, balance.   Personal Factors and Comorbidities  Behavior Pattern    Examination-Activity Limitations  Bathing;Dressing;Sit;Transfers;Sleep;Bed Mobility;Bend;Caring for Others;Carry;Toileting;Reach Overhead;Stand;Locomotion Level;Stairs;Squat;Lift;Hygiene/Grooming    Examination-Participation Restrictions  Church;Interpersonal Relationship;Personal Finances;Yard Work;Cleaning;Laundry;School;Community Activity;Medication Management;Driving;Meal Prep;Shop    Rehab Potential  Good    Clinical Impairments Affecting Rehab Potential  unsure of severity of cognitive/psych deficits    PT Frequency  2x / week    PT Duration  12 weeks    PT Treatment/Interventions  ADLs/Self Care Home Management;DME Instruction;Gait training;Stair training;Functional mobility training;Therapeutic activities;Therapeutic exercise;Balance training;Neuromuscular re-education;Cognitive remediation;Patient/family education;Vestibular;Energy conservation;Cryotherapy;Ultrasound;Electrical Stimulation;Fluidtherapy;Iontophoresis 43m/ml Dexamethasone;Canalith Repostioning;Moist Heat;Traction;Passive range of motion;Manual techniques;Joint Manipulations;Spinal Manipulations;Splinting;Taping;Scar mobilization;Wheelchair mobility training;Prosthetic Training;Orthotic Fit/Training;Dry needling;Visual/perceptual remediation/compensation;Aquatic Therapy    PT Next Visit Plan  continue with LE strengthening, NME, and motor control activities    PT  Home Exercise Plan  Access Code: [email or web address]    Consulted and Agree with Plan of Care  Patient       Patient will benefit from skilled therapeutic intervention in order to improve the following deficits and impairments:  Decreased activity tolerance, Decreased balance, Decreased cognition, Decreased endurance, Decreased knowledge of use of DME, Decreased mobility, Decreased strength, Impaired perceived functional ability, Postural dysfunction, Abnormal gait, Difficulty walking, Impaired tone, Decreased range of motion, Decreased coordination, Impaired UE functional use, Pain  Visit Diagnosis: Muscle weakness (generalized)  Other lack of coordination  Other abnormalities of gait and mobility  Right middle cerebral artery stroke (HCC)  Acute pain of left shoulder  Ischemic stroke diagnosed during current admission ([facility]     Problem List Patient Active Problem List   Diagnosis Date Noted  . Spastic hemiparesis ([place]   . Elevated BUN   . Vascular headache   . Mood disorder in conditions classified elsewhere   . Right middle cerebral artery stroke ([place] 02/16/2019  . Polysubstance abuse ([place]   . Dyslipidemia   . Ischemic stroke diagnosed during current admission ([place] 02/13/2019  . MDD (major depressive disorder), recurrent episode, moderate ([place] 02/10/2019  . Dysphagia 02/09/2019  . Elevated CK 02/09/2019  . CVA (cerebral vascular accident) ([place] 02/07/2019  . Cellulitis of arm 09/20/2017  . IV drug abuse ([facility] 09/20/2017  . Quadriceps weakness 06/18/2016  . TBI (traumatic brain injury) ([place] 05/01/2016  . 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 Respiratory distress   . Glasgow coma scale total score 3-8 ([place]   . Pneumonia of both lower lobes due to methicillin resistant Staphylococcus aureus (MRSA) ([place]   . Acute pulmonary edema (HCC)   . Non-traumatic rhabdomyolysis   . Opioid abuse ([place]   . Elevated troponin   . Acute respiratory failure with hypoxia ([facility]    . Drug overdose   . Elevated liver enzymes   . Encephalopathy acute   . Encephalopathy 04/19/2016  . Opiate overdose ([place] 04/19/2016    MAlanson Puls PVirginiaDPT 04/28/2019, 1:02 PM  [place] [facility] [address] [place] [address]Phone: [phone]  Fax:  [phone] Name: LLacrystal BarbeMRN:  [id number] Date of Birth: 04/12/1995

## 2019-05-02 ENCOUNTER — Ambulatory Visit: Payer: 59 | Admitting: Occupational Therapy

## 2019-05-02 ENCOUNTER — Ambulatory Visit: Payer: 59

## 2019-05-02 ENCOUNTER — Encounter: Payer: Self-pay | Admitting: Occupational Therapy

## 2019-05-02 ENCOUNTER — Other Ambulatory Visit: Payer: Self-pay

## 2019-05-02 DIAGNOSIS — M6281 Muscle weakness (generalized): Secondary | ICD-10-CM

## 2019-05-02 DIAGNOSIS — R278 Other lack of coordination: Secondary | ICD-10-CM

## 2019-05-02 DIAGNOSIS — R2689 Other abnormalities of gait and mobility: Secondary | ICD-10-CM

## 2019-05-02 NOTE — Therapy (Signed)
[place] MAIN [facility] SERVICES [address] [place], [place], [address] Phone: [phone]   Fax:  [phone]  Occupational Therapy Treatment  Patient Details  Name: Jane Watkins MRN: [id number] Date of Birth: October 19, 1995 Referring Provider (OT): Dr. [name]   Encounter Date: 05/02/2019  OT End of Session - 05/02/19 1650    Visit Number  12    Number of Visits  24    Date for OT Re-Evaluation  05/30/19    Authorization Type  Progress report period starting 03/07/2019    OT Start Time  1603    OT Stop Time  1645    OT Time Calculation (min)  42 min    Activity Tolerance  Patient tolerated treatment well    Behavior During Therapy  Flat affect       Past Medical History:  Diagnosis Date  . AKI (acute kidney injury) ([place]) 2018   "from overdose"  . Anxiety   . Daily headache   . Depression   . Drug overdose 04/2016   [name] 05/01/2016  . GERD (gastroesophageal reflux disease)    "when I was younger; gone now" (09/21/2017)  . Migraine    "q couple weeks" (09/21/2017)  . Overdose   . Stroke [facility])     Past Surgical History:  Procedure Laterality Date  . APPENDECTOMY  04/2013  . I & D EXTREMITY Left 09/20/2017   Procedure: IRRIGATION AND DEBRIDEMENT LEFT ARM;  Surgeon: [name], MD;  Location: [place];  Service: Orthopedics;  Laterality: Left;  . TEE WITHOUT CARDIOVERSION N/A 02/10/2019   Procedure: TRANSESOPHAGEAL ECHOCARDIOGRAM (TEE);  Surgeon: [name], MD;  Location: [facility];  Service: Cardiovascular;  Laterality: N/A;  Polysubstance abuser  . TONGUE SURGERY  ~ 2024   "related to lisp"  . TRACHEOSTOMY  04/2016   [name] 05/01/2016    There were no vitals filed for this visit.  Subjective Assessment - 05/02/19 1650    Subjective   Pt. reports that she will be going to counseling.   Pertinent History  Per chart. Pt. is a 24 y.o.female with a history of IV drug use, polysubstance as well as tobacco abuse, anxiety. Pt.  presented to [facility] on 02/07/2019 after being found unresponsive.  EMS contacted patient received Narcan with some improvement in mental status.  Admission labs with WBC 20,700, potassium 5.2, BUN 25, creatinine 0.67, urine drug screen positive cocaine, marijuana and benzodiazepines, alcohol level less than 10, SARS coronavirus negative.  Cranial CT scan showed areas of indistinct low-density in the right basal ganglia with mass-effect on the right lateral ventricle.  Patient did not receive TPA.  MRI showed acute infarction of right MCA territory, affecting the basal ganglia and affecting the cortical and subcortical brain in a largely watershed distribution. Pt. had a previous inpatient rehab admission 24/22/2018 to 05/09/2016 for hypoxic encephalopathy after drug overdose requiring tracheostomy, and was later decannulated. Pt. resides with her boyfriend in an apartment. 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    Patient Stated Goals  Patient reports she would like to be as independent as possible with all her daily tasks.    Currently in Pain?  No/denies      OT TREATMENT    Neuro muscular re-education:  Pt. worked on left hand [facility] skills grasping 1", 3/4,", and 1/2" flat washers from a magnetic dish. Pt. worked on reaching up and placing them onto vertical, and diagonal dowels.  Pt. alternated weightbearing  with a flat hand at the tabletop. Pt. Worked on stacking 1", and 3/4" washers at the table. Pt. Had difficulty stacking 3/4" washers. Pt. Was able to stack the 1" washers with increased time, several attempts, and positioning of the washers.  Pt. continues to make steady progress. Pt. is improving with LUE functioning, and is now engaging her hand in more tasks at home. Pt. is improving grasping with her 2nd digit, and thumb, as well as extending off of objects. Pt. is responding well to inhibitory techniques, weightbearing with a flat hand at the tabletop surface. Pt.  continues to work on improving LUE functioning in order to improve functional reaching, and normalizing tone to improve grasping patterns, and increase engagement during ADLs, and IADL tasks.                           OT Education - 05/02/19 1650    Education Details  ROM and shoulder stabilization exercises    Person(s) Educated  Patient    Methods  Explanation;Demonstration;Tactile cues;Verbal cues;Handout    Comprehension  Verbalized understanding;Returned demonstration;Need further instruction          OT Long Term Goals - 04/25/19 1529      OT LONG TERM GOAL #1   Title  Pt. will increase active isolated left shoulder flexion by 20 degrees in preparation for combing her hair.    Baseline  04/25/2019: Shoulder flexion 132. Pt. is unable to use her left hand to brush her hair. Eval: No isolated shoulder shoulder flexion. Pt. compensates with synergystic movement with attempts for flexion.    Time  12    Period  Weeks    Status  Partially Met    Target Date  05/30/19      OT LONG TERM GOAL #2   Title  Pt. will increase active shoulder abduction by 20 degrees in preparation to be able to wash her hair.    Baseline  04/25/2019: shoulder abduction:95. Pt. is unable to use her LUE to wash her hair. Eval: Active shoulder abduction 56 degrees.    Time  12    Period  Weeks    Status  Partially Met    Target Date  05/30/19      OT LONG TERM GOAL #3   Title  Pt. will improve hand to face patterns using her left hand to be able to independently wash her face.    Baseline  04/25/2019: Pt. is able to perfrom hand to face patterns with her left hand, however is not able to use for left hand to wash her face.Eval: Pt. is unable to wash her face using her left hand    Time  12    Period  Weeks    Status  Partially Met    Target Date  05/30/19      OT LONG TERM GOAL #4   Title  Pt. will increase AROM wrist extension by 10 degrees in preparation for reaching for a  cup    Baseline  04/25/2019: Wrist extension 54. Pt. conitnues to work to reaching for a cup with her left hand. Eval: 0 degrees of active wrist extension.    Time  12    Period  Weeks    Status  Partially Met    Target Date  06/09/19      OT LONG TERM GOAL #5   Title  Pt. will increase left digit flexion to be able to  initiate actively holding a toothbrush.    Baseline  04/25/2019: Pt. is indpendently able to hold, and stabilize a toorhbursh with her left hand while applying toothpaste. pt. is unable to use her left hand to brush her teeth. Eval: Pt. is unable to use her left hand to hold a toothbrush.    Time  12    Period  Weeks    Status  Partially Met    Target Date  05/30/19            Plan - 05/02/19 1651    Clinical Impression Statement Pt. continues to make steady progress. Pt. is improving with LUE functioning, and is now engaging her hand in more tasks at home. Pt. is improving grasping with her 2nd digit, and thumb, as well as extending off of objects. Pt. is responding well to inhibitory techniques, weightbearing with a flat hand at the tabletop surface. Pt. continues to work on improving LUE functioning in order to improve functional reaching, and normalizing tone to improve grasping patterns, and increase engagement during ADLs, and IADL tasks.     OT Occupational Profile and History  Detailed Assessment- Review of Records and additional review of physical, cognitive, psychosocial history related to current functional performance    Occupational performance deficits (Please refer to evaluation for details):  ADL's;IADL's    Body Structure / Function / Physical Skills  ADL;IADL;Coordination;Endurance;UE functional use;Decreased knowledge of precautions;Dexterity;FMC;ROM;Tone;Strength    Rehab Potential  Good    Clinical Decision Making  Several treatment options, min-mod task modification necessary    Comorbidities Affecting Occupational Performance:  May have  comorbidities impacting occupational performance    Modification or Assistance to Complete Evaluation   Min-Moderate modification of tasks or assist with assess necessary to complete eval    OT Frequency  2x / week    OT Duration  12 weeks    OT Treatment/Interventions  Self-care/ADL training;DME and/or AE instruction;Energy conservation;Therapeutic activities;Patient/family education;Therapeutic exercise;Neuromuscular education    Consulted and Agree with Plan of Care  Patient       Patient will benefit from skilled therapeutic intervention in order to improve the following deficits and impairments:   Body Structure / Function / Physical Skills: ADL, IADL, Coordination, Endurance, UE functional use, Decreased knowledge of precautions, Dexterity, FMC, ROM, Tone, Strength       Visit Diagnosis: Muscle weakness (generalized)  Other lack of coordination    Problem List Patient Active Problem List   Diagnosis Date Noted  . Spastic hemiparesis ([place])   . Elevated BUN   . Vascular headache   . Mood disorder in conditions classified elsewhere   . Right middle cerebral artery stroke ([place]) 02/16/2019  . Polysubstance abuse ([place])   . Dyslipidemia   . Ischemic stroke diagnosed during current admission ([place]) 02/13/2019  . MDD (major depressive disorder), recurrent episode, moderate ([place]) 02/10/2019  . Dysphagia 02/09/2019  . Elevated CK 02/09/2019  . CVA (cerebral vascular accident) ([place]) 02/07/2019  . Cellulitis of arm 09/20/2017  . IV drug abuse ([place]) 09/20/2017  . Quadriceps weakness 06/18/2016  . TBI (traumatic brain injury) ([place]) 05/01/2016  . 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 Respiratory distress   . Glasgow coma scale total score 3-8 ([place])   . Pneumonia of both lower lobes due to methicillin resistant Staphylococcus aureus (MRSA) ([place])   . Acute pulmonary edema (HCC)   . Non-traumatic rhabdomyolysis   . Opioid abuse ([place])   . Elevated troponin   . Acute  respiratory failure with hypoxia (  [place])   . Drug overdose   . Elevated liver enzymes   . Encephalopathy acute   . Encephalopathy 04/19/2016  . Opiate overdose ([place]) 04/19/2016    Harrel Carina, MS, OTR/L 05/02/2019, 4:53 PM  [place] MAIN [facility] SERVICES [address] [place], [place], [address] Phone: [phone]   Fax:  [phone]  Name: Jane Watkins MRN: [id number] Date of Birth: Jun 19, 1995

## 2019-05-02 NOTE — Therapy (Signed)
[place] MAIN [facility] SERVICES [address] [place], [place], [address] Phone: [phone]   Fax:  [phone]  Physical Therapy Treatment  Patient Details  Name: Jane Watkins MRN: [id number] Date of Birth: 09/06/1995 Referring Provider (PT): Dr. Earnest Conroy. [name]   Encounter Date: 05/02/2019  PT End of Session - 05/02/19 1506    Visit Number  12    Number of Visits  24    Date for PT Re-Evaluation  05/25/19    Authorization Type  [employer], 60 visit limit per discipline    PT Start Time  1510    PT Stop Time  1555    PT Time Calculation (min)  45 min    Equipment Utilized During Treatment  Gait belt    Activity Tolerance  Patient tolerated treatment well;No increased pain    Behavior During Therapy  Flat affect       Past Medical History:  Diagnosis Date  . AKI (acute kidney injury) ([place]) 2018   "from overdose"  . Anxiety   . Daily headache   . Depression   . Drug overdose 04/2016   [name] 05/01/2016  . GERD (gastroesophageal reflux disease)    "when I was younger; gone now" (09/21/2017)  . Migraine    "q couple weeks" (09/21/2017)  . Overdose   . Stroke [facility])     Past Surgical History:  Procedure Laterality Date  . APPENDECTOMY  04/2013  . I & D EXTREMITY Left 09/20/2017   Procedure: IRRIGATION AND DEBRIDEMENT LEFT ARM;  Surgeon: [name], MD;  Location: [place];  Service: Orthopedics;  Laterality: Left;  . TEE WITHOUT CARDIOVERSION N/A 02/10/2019   Procedure: TRANSESOPHAGEAL ECHOCARDIOGRAM (TEE);  Surgeon: [name], MD;  Location: [facility] ORS;  Service: Cardiovascular;  Laterality: N/A;  Polysubstance abuser  . TONGUE SURGERY  ~ 2007   "related to lisp"  . TRACHEOSTOMY  04/2016   [name] 05/01/2016    There were no vitals filed for this visit.  Subjective Assessment - 05/02/19 1505    Subjective  Pt reports that she is not doing very well today. She has had a prolonged separation from her husband and found out this past weekend  that her died of an overdose. She has been very sad and is grieving his loss. No falls since last therapy session. Denies pain upon arrival.    Pertinent History  Pt is 24 y.o. female presenting to hospital 02/07/19 initially,  MRI showing acute infarct R MCA territory affecting basal ganglia and affecting cortical and subcortical brain in a largely watershed distribution. After in hospital stay patient transitioned to CIR from 02/16/2019-03/01/2019.  PMH includes IV drug abuse, anxiety, depression, migraines, AKI, hypoxic ischemic encephalopathy 2018, opiate overdose 2018, h/o trach 2018, h/o I&D L UE. PLOF: lives with boyfriend in one story apartment, prior to CVA patient was independent, working.    Limitations  Walking;Reading;Lifting;Writing;House hold activities;Standing    How long can you sit comfortably?  NA    How long can you stand comfortably?  20 mins    How long can you walk comfortably?  20 mins    Diagnostic tests  see MRI results    Patient Stated Goals  Patient wants to maximize how much she can walk, get back to normal    Currently in Pain?  No/denies         TREATMENT   Ther-ex  NuStep L2 x 5 minutes for warm-up during history BLE only (3 minutes unbilled); Hooklying  L SLR x 10, pt cued to initiate with quad set to maintain knee straight; LLE D1 resisted flexion/extension x 10; Supine straight knee hip abduction/adduction with manual resistance x 10 each; Hooklying BLE bridges with RLE slightly extended 3s hold x 10; Manually resisted LLE leg press x 10; R sidelying L clams with manual resistance from therapist 2 x 10; R sidelying L hip abduction with manual resistance from therapist 2 x 10; R sidelying knee bridges 3s hold x 10; L sidelying knee bridges 3s hold x 10; Rolling with therapist providing resistance initially at shoulder and knee x 5 each direction, pt requiring extensive cues for effort and rolling technique; Sit to stand from low mat table without UE  support and RLE elevated on 6" box, CGA/minA+1 from therapist 2 x 10;     Pt educated throughout session about proper posture and technique with exercises. Improved exercise technique, movement at target joints, use of target muscles after min to mod verbal, visual, tactile cues.   Patient demonstrates excellent motivation todayduring session however she is obviously upset regarding the recent passing of her husband from which she has been separated by quite some time. She expresses that feeling sad pushes her to want to use drugs again. However she denies having relapsed and reports that she has a support system in her boyfriend and father that keep her from using. She does not currently have possessions of any substances or means to access them. She also expresses some suicidal thoughts. She denies any plans to commit suicide and verbally contracts with therapist that she will not harm herself or attempt suicide. Therapist advised her to call 911 or go to the closes emergency room if she ever believes she would carry out any self-harm plans. Pt is able to perform all exercises today as instructed with intermittent rest breaks due to grief. At request of pt therapist made a referral to a local counseling agency, [facility]. Therapist also recently provided pt with the name of a PCP and she reports that she made an appointment to establish care. Pt encouraged to continue HEP.  Pt will benefit from PT services to address deficits in strength, balance, and mobility in order to return to full function at home.                         PT Short Term Goals - 04/25/19 1627      PT SHORT TERM GOAL #1   Title  Pt will be independent with initial HEP in order to indicate improved strength and decreased fall risk.    Baseline  initial HEP administered on eval 03/02/2019; 04/25/19: "not as much as should."    Time  6    Period  Weeks    Status  Partially Met    Target Date   04/13/19        PT Long Term Goals - 04/25/19 1628      PT LONG TERM GOAL #1   Title  Pt will be independent with final HEP in order to indicate improved strength and decreased fall risk.    Baseline  initial HEP administered on eval 03/02/19; 04/25/19: "not as much as a should."    Time  12    Period  Weeks    Status  On-going    Target Date  05/25/19      PT LONG TERM GOAL #2   Title  Pt will decrease 5TSTS to less than  10 seconds in order to demonstrate clinically significant improvement in LE strength and functional mobility.    Baseline  03/02/2019: 17 seconds without UE support, dependent on RLE, very little weight shift to LLE; 04/25/19: 7.7s, no UE, decreased weight shifting to LLE;    Time  12    Period  Weeks    Status  Achieved      PT LONG TERM GOAL #3   Title  Pt will perform 6MWT > 1045f with LRAD at mod I level in order to indicate safe community/leisure negotiation.    Baseline  03/02/2019 5350f 04/25/19: 1000'    Time  12    Period  Weeks    Status  Partially Met    Target Date  05/25/19      PT LONG TERM GOAL #4   Title  The patient will demonstrate at least 1/2 grade improvement in LE MMT grade to improve ability to perform functional activities.    Baseline  see eval for details 03/02/2019; 04/25/19: see visit note, significant improvement, still lacking L anikle dorsiflexion, eversion, or inversion    Time  12    Period  Weeks    Status  Partially Met    Target Date  05/25/19      PT LONG TERM GOAL #5   Title  Pt will ambulate >1 m/s with LRAD mod I to indicate gait velocity of community ambulator.    Baseline  03/02/19: self selected: .57 m/s, fastest .7217mwith quad cane; 04/25/19: Self-selected: 10.8s = 0.93 m/s; Fastest: 8.8s = 1.14 m/s, performed with no AD    Time  12    Period  Weeks    Status  Partially Met    Target Date  05/25/19            Plan - 05/02/19 1506    Clinical Impression Statement  Patient demonstrates excellent motivation  today during session however she is obviously upset regarding the recent passing of her husband from which she has been separated by quite some time. She expresses that feeling sad pushes her to want to use drugs again. However she denies having relapsed and reports that she has a support system in her boyfriend and father that keep her from using. She does not currently have possessions of any substances or means to access them. She also expresses some suicidal thoughts. She denies any plans to commit suicide and verbally contracts with therapist that she will not harm herself or attempt suicide. Therapist advised her to call 911 or go to the closes emergency room if she ever believes she would carry out any self-harm plans. Pt is able to perform all exercises today as instructed with intermittent rest breaks due to grief. At request of pt therapist made a referral to a local counseling agency, [facility]. Therapist also recently provided pt with the name of a PCP and she reports that she made an appointment to establish care. Pt encouraged to continue HEP.  Pt will benefit from PT services to address deficits in strength, balance, and mobility in order to return to full function at home.    Personal Factors and Comorbidities  Behavior Pattern    Examination-Activity Limitations  Bathing;Dressing;Sit;Transfers;Sleep;Bed Mobility;Bend;Caring for Others;Carry;Toileting;Reach Overhead;Stand;Locomotion Level;Stairs;Squat;Lift;Hygiene/Grooming    Examination-Participation Restrictions  Church;Interpersonal Relationship;Personal Finances;Yard Work;Cleaning;Laundry;School;Community Activity;Medication Management;Driving;Meal Prep;Shop    Rehab Potential  Good    Clinical Impairments Affecting Rehab Potential  unsure of severity of cognitive/psych deficits    PT Frequency  2x /  week    PT Duration  12 weeks    PT Treatment/Interventions  ADLs/Self Care Home Management;DME Instruction;Gait training;Stair  training;Functional mobility training;Therapeutic activities;Therapeutic exercise;Balance training;Neuromuscular re-education;Cognitive remediation;Patient/family education;Vestibular;Energy conservation;Cryotherapy;Ultrasound;Electrical Stimulation;Fluidtherapy;Iontophoresis 82m/ml Dexamethasone;Canalith Repostioning;Moist Heat;Traction;Passive range of motion;Manual techniques;Joint Manipulations;Spinal Manipulations;Splinting;Taping;Scar mobilization;Wheelchair mobility training;Prosthetic Training;Orthotic Fit/Training;Dry needling;Visual/perceptual remediation/compensation;Aquatic Therapy    PT Next Visit Plan  continue with LE strengthening, NME, and motor control activities    PT Home Exercise Plan  Access Code: [email or web address]    Consulted and Agree with Plan of Care  Patient       Patient will benefit from skilled therapeutic intervention in order to improve the following deficits and impairments:  Decreased activity tolerance, Decreased balance, Decreased cognition, Decreased endurance, Decreased knowledge of use of DME, Decreased mobility, Decreased strength, Impaired perceived functional ability, Postural dysfunction, Abnormal gait, Difficulty walking, Impaired tone, Decreased range of motion, Decreased coordination, Impaired UE functional use, Pain  Visit Diagnosis: Muscle weakness (generalized)  Other abnormalities of gait and mobility     Problem List Patient Active Problem List   Diagnosis Date Noted  . Spastic hemiparesis ([place]   . Elevated BUN   . Vascular headache   . Mood disorder in conditions classified elsewhere   . 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 Polysubstance abuse ([place]   . Dyslipidemia   . Ischemic stroke diagnosed during current admission ([place] 02/13/2019  . MDD (major depressive disorder), recurrent episode, moderate ([place] 02/10/2019  . Dysphagia 02/09/2019  . Elevated CK 02/09/2019  . CVA (cerebral vascular accident) ([place] 02/07/2019  .  Cellulitis of arm 09/20/2017  . IV drug abuse ([place] 09/20/2017  . Quadriceps weakness 06/18/2016  . TBI (traumatic brain injury) ([place] 05/01/2016  . 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 Respiratory distress   . Glasgow coma scale total score 3-8 ([place]   . Pneumonia of both lower lobes due to methicillin resistant Staphylococcus aureus (MRSA) ([place]   . Acute pulmonary edema ([facility])   . Non-traumatic rhabdomyolysis   . Opioid abuse ([place]   . Elevated troponin   . Acute respiratory failure with hypoxia ([place]   . Drug overdose   . Elevated liver enzymes   . Encephalopathy acute   . Encephalopathy 04/19/2016  . Opiate overdose ([place] 04/19/2016   JPhillips GroutPT, DPT, GCS  Gradie Ohm 05/02/2019, 5:34 PM  [place] [address] [address]Phone: [phone]  Fax:  [phone] Name: LFantasy DonaldMRN: [id number] of Birth: [birth year]

## 2019-05-03 ENCOUNTER — Other Ambulatory Visit: Payer: Self-pay

## 2019-05-03 ENCOUNTER — Ambulatory Visit (HOSPITAL_COMMUNITY)
Admission: RE | Admit: 2019-05-03 | Discharge: 2019-05-03 | Disposition: A | Discharge status: Home / Self Care | Payer: 59 | Source: Ambulatory Visit | Attending: Neurology | Admitting: Neurology

## 2019-05-03 DIAGNOSIS — I63511 Cerebral infarction due to unspecified occlusion or stenosis of right middle cerebral artery: Secondary | ICD-10-CM | POA: Diagnosis present

## 2019-05-03 NOTE — Progress Notes (Signed)
TCD Bubble study has been completed.   Preliminary results in CV Proc.   Blanch Media 05/03/2019 2:19 PM

## 2019-05-04 ENCOUNTER — Ambulatory Visit: Payer: 59

## 2019-05-04 ENCOUNTER — Encounter: Payer: 59 | Admitting: Occupational Therapy

## 2019-05-05 ENCOUNTER — Ambulatory Visit: Payer: 59

## 2019-05-05 ENCOUNTER — Ambulatory Visit: Payer: 59 | Admitting: Occupational Therapy

## 2019-05-06 ENCOUNTER — Encounter: Payer: 59 | Admitting: Physical Medicine and Rehabilitation

## 2019-05-09 ENCOUNTER — Other Ambulatory Visit: Payer: Self-pay

## 2019-05-09 ENCOUNTER — Encounter: Payer: Self-pay | Admitting: Physical Therapy

## 2019-05-09 ENCOUNTER — Ambulatory Visit: Payer: 59 | Admitting: Occupational Therapy

## 2019-05-09 ENCOUNTER — Encounter: Payer: Self-pay | Admitting: Occupational Therapy

## 2019-05-09 ENCOUNTER — Ambulatory Visit: Payer: 59 | Admitting: Physical Therapy

## 2019-05-09 DIAGNOSIS — R2689 Other abnormalities of gait and mobility: Secondary | ICD-10-CM

## 2019-05-09 DIAGNOSIS — R278 Other lack of coordination: Secondary | ICD-10-CM

## 2019-05-09 DIAGNOSIS — M6281 Muscle weakness (generalized): Secondary | ICD-10-CM

## 2019-05-09 DIAGNOSIS — I63511 Cerebral infarction due to unspecified occlusion or stenosis of right middle cerebral artery: Secondary | ICD-10-CM

## 2019-05-09 DIAGNOSIS — M25512 Pain in left shoulder: Secondary | ICD-10-CM

## 2019-05-09 DIAGNOSIS — I639 Cerebral infarction, unspecified: Secondary | ICD-10-CM

## 2019-05-09 DIAGNOSIS — R41844 Frontal lobe and executive function deficit: Secondary | ICD-10-CM

## 2019-05-09 NOTE — Therapy (Signed)
[place] MAIN [facility] SERVICES [address] [place], [place], [address] Phone: [phone]   Fax:  [phone]  Occupational Therapy Treatment  Patient Details  Name: Jane Watkins MRN: [id number] Date of Birth: 26-Apr-1995 Referring Provider (OT): Dr. [name]   Encounter Date: 05/09/2019  OT End of Session - 05/09/19 1615    Visit Number  13    Number of Visits  24    Date for OT Re-Evaluation  05/30/19    Authorization Type  Progress report period starting 03/07/2019    Activity Tolerance  Patient tolerated treatment well    Behavior During Therapy  Flat affect       Past Medical History:  Diagnosis Date  . AKI (acute kidney injury) ([place]) 2018   "from overdose"  . Anxiety   . Daily headache   . Depression   . Drug overdose 04/2016   [name] 05/01/2016  . GERD (gastroesophageal reflux disease)    "when I was younger; gone now" (09/21/2017)  . Migraine    "q couple weeks" (09/21/2017)  . Overdose   . Stroke [facility])     Past Surgical History:  Procedure Laterality Date  . APPENDECTOMY  04/2013  . I & D EXTREMITY Left 09/20/2017   Procedure: IRRIGATION AND DEBRIDEMENT LEFT ARM;  Surgeon: [name], MD;  Location: [place];  Service: Orthopedics;  Laterality: Left;  . TEE WITHOUT CARDIOVERSION N/A 02/10/2019   Procedure: TRANSESOPHAGEAL ECHOCARDIOGRAM (TEE);  Surgeon: [name], MD;  Location: [facility] ORS;  Service: Cardiovascular;  Laterality: N/A;  Polysubstance abuser  . TONGUE SURGERY  ~ 2007   "related to lisp"  . TRACHEOSTOMY  04/2016   [name] 05/01/2016    There were no vitals filed for this visit.  Subjective Assessment - 05/09/19 1615    Subjective   Pt. reports that she going to counseling.    Pertinent History  Per chart. Pt. is a 24 y.o.female with a history of IV drug use, polysubstance as well as tobacco abuse, anxiety. Pt. presented to [facility] on 02/07/2019 after being found unresponsive.  EMS contacted patient received  Narcan with some improvement in mental status.  Admission labs with WBC 20,700, potassium 5.2, BUN 25, creatinine 0.67, urine drug screen positive cocaine, marijuana and benzodiazepines, alcohol level less than 10, SARS coronavirus negative.  Cranial CT scan showed areas of indistinct low-density in the right basal ganglia with mass-effect on the right lateral ventricle.  Patient did not receive TPA.  MRI showed acute infarction of right MCA territory, affecting the basal ganglia and affecting the cortical and subcortical brain in a largely watershed distribution. Pt. had a previous inpatient rehab admission 05/01/2016 to 05/09/2016 for hypoxic encephalopathy after drug overdose requiring tracheostomy, and was later decannulated. Pt. resides with her boyfriend in an apartment. 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    Patient Stated Goals  Patient reports she would like to be as independent as possible with all her daily tasks.    Currently in Pain?  No/denies      OT TREATMENT    Neuro muscular re-education:  Pt. worked on left hand [facility] skills grasping 1", 3/4,", and 1/2" flat washers from a magnetic dish. Pt. worked on reaching up and placing them onto vertical, and diagonal dowels.  Pt.alternated weightbearing with a flat hand at the tabletop. Pt. required the diagonal dowels to be stabilized when attempting to remove the washers with her left hand. Pt. performed [facility]  tasks using the grooved pegboard. Pt. worked on grasping the grooved pegs from a horizontal position, and moving the pegs to a vertical position in her hand to prepare for placing them in the grooved slot.  Pt. required increased time to grasp the pegs, and occasional assist from her right hand to assist with turning the pegs.Pt. required increased time to complete.   Pt. continues to make steady progress. Pt.is improving with LUE functioning. Pt. is able to pick more items up at home, and open more items  at home. Pt. continues to make progress with grasping with her 2nd digit, and thumb, as well as extending completely off of objects. Pt. is responding well to inhibitory techniques, weightbearing with a flat hand at the tabletop surface.Pt. continues to work on improving LUE functioning in order toimprove functional reaching, and normalizing tone to improve grasping patterns, and increaseengagement during ADLs, and IADL tasks.                           OT Education - 05/09/19 1615    Education Details  ROM and shoulder stabilization exercises    Person(s) Educated  Patient    Methods  Explanation;Demonstration;Tactile cues;Verbal cues;Handout    Comprehension  Verbalized understanding;Returned demonstration;Need further instruction          OT Long Term Goals - 04/25/19 1529      OT LONG TERM GOAL #1   Title  Pt. will increase active isolated left shoulder flexion by 20 degrees in preparation for combing her hair.    Baseline  04/25/2019: Shoulder flexion 132. Pt. is unable to use her left hand to brush her hair. Eval: No isolated shoulder shoulder flexion. Pt. compensates with synergystic movement with attempts for flexion.    Time  12    Period  Weeks    Status  Partially Met    Target Date  05/30/19      OT LONG TERM GOAL #2   Title  Pt. will increase active shoulder abduction by 20 degrees in preparation to be able to wash her hair.    Baseline  04/25/2019: shoulder abduction:95. Pt. is unable to use her LUE to wash her hair. Eval: Active shoulder abduction 56 degrees.    Time  12    Period  Weeks    Status  Partially Met    Target Date  05/30/19      OT LONG TERM GOAL #3   Title  Pt. will improve hand to face patterns using her left hand to be able to independently wash her face.    Baseline  04/25/2019: Pt. is able to perfrom hand to face patterns with her left hand, however is not able to use for left hand to wash her face.Eval: Pt. is unable to  wash her face using her left hand    Time  12    Period  Weeks    Status  Partially Met    Target Date  05/30/19      OT LONG TERM GOAL #4   Title  Pt. will increase AROM wrist extension by 10 degrees in preparation for reaching for a cup    Baseline  04/25/2019: Wrist extension 54. Pt. conitnues to work to reaching for a cup with her left hand. Eval: 0 degrees of active wrist extension.    Time  12    Period  Weeks    Status  Partially Met    Target  Date  06/09/19      OT LONG TERM GOAL #5   Title  Pt. will increase left digit flexion to be able to initiate actively holding a toothbrush.    Baseline  04/25/2019: Pt. is indpendently able to hold, and stabilize a toorhbursh with her left hand while applying toothpaste. pt. is unable to use her left hand to brush her teeth. Eval: Pt. is unable to use her left hand to hold a toothbrush.    Time  12    Period  Weeks    Status  Partially Met    Target Date  05/30/19            Plan - 05/09/19 1617    Clinical Impression Statement Pt. continues to make steady progress. Pt.is improving with LUE functioning. Pt. is able to pick more items up at home, and open more items at home. Pt. continues to make progress with grasping with her 2nd digit, and thumb, as well as extending completely off of objects. Pt. is responding well to inhibitory techniques, weightbearing with a flat hand at the tabletop surface.Pt. continues to work on improving LUE functioning in order toimprove functional reaching, and normalizing tone to improve grasping patterns, and increaseengagement during ADLs, and IADL tasks.   OT Occupational Profile and History  Detailed Assessment- Review of Records and additional review of physical, cognitive, psychosocial history related to current functional performance    Occupational performance deficits (Please refer to evaluation for details):  ADL's;IADL's    Body Structure / Function / Physical Skills   ADL;IADL;Coordination;Endurance;UE functional use;Decreased knowledge of precautions;Dexterity;FMC;ROM;Tone;Strength    Rehab Potential  Good    Clinical Decision Making  Several treatment options, min-mod task modification necessary    Comorbidities Affecting Occupational Performance:  May have comorbidities impacting occupational performance    Modification or Assistance to Complete Evaluation   Min-Moderate modification of tasks or assist with assess necessary to complete eval    OT Frequency  2x / week    OT Treatment/Interventions  Self-care/ADL training;DME and/or AE instruction;Energy conservation;Therapeutic activities;Patient/family education;Therapeutic exercise;Neuromuscular education    Consulted and Agree with Plan of Care  Patient       Patient will benefit from skilled therapeutic intervention in order to improve the following deficits and impairments:   Body Structure / Function / Physical Skills: ADL, IADL, Coordination, Endurance, UE functional use, Decreased knowledge of precautions, Dexterity, FMC, ROM, Tone, Strength       Visit Diagnosis: Muscle weakness (generalized)  Other lack of coordination    Problem List Patient Active Problem List   Diagnosis Date Noted  . Spastic hemiparesis ([place])   . Elevated BUN   . Vascular headache   . Mood disorder in conditions classified elsewhere   . Right middle cerebral artery stroke ([place]) 02/16/2019  . Polysubstance abuse ([place])   . Dyslipidemia   . Ischemic stroke diagnosed during current admission ([place]) 02/13/2019  . MDD (major depressive disorder), recurrent episode, moderate ([place]) 02/10/2019  . Dysphagia 02/09/2019  . Elevated CK 02/09/2019  . CVA (cerebral vascular accident) ([place]) 02/07/2019  . Cellulitis of arm 09/20/2017  . IV drug abuse ([place]) 09/20/2017  . Quadriceps weakness 06/18/2016  . TBI (traumatic brain injury) ([place]) 05/01/2016  . 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 Respiratory distress   .  Glasgow coma scale total score 3-8 ([place])   . Pneumonia of both lower lobes due to methicillin resistant Staphylococcus aureus (MRSA) ([place])   . Acute pulmonary edema ([facility])   .  Non-traumatic rhabdomyolysis   . Opioid abuse ([place])   . Elevated troponin   . Acute respiratory failure with hypoxia ([place])   . Drug overdose   . Elevated liver enzymes   . Encephalopathy acute   . Encephalopathy 04/19/2016  . Opiate overdose ([place]) 04/19/2016    Harrel Carina, MS, OTR/L 05/09/2019, 4:19 PM  [place] MAIN [facility] SERVICES [address] [place], [place], [address] Phone: [phone]   Fax:  [phone]  Name: Jane Watkins MRN: [id number] Date of Birth: [birth year]

## 2019-05-09 NOTE — Therapy (Signed)
[place] MAIN [facility] SERVICES [address] [place], [place], [address] Phone: [phone]   Fax:  [phone]  Physical Therapy Treatment  Patient Details  Name: Jane Watkins MRN: [id number] Date of Birth: [birth year] Referring Provider (PT): Dr. Earnest Conroy. Jane Watkins   Encounter Date: 05/09/2019  PT End of Session - 05/09/19 1724    Visit Number  13    Number of Visits  24    Date for PT Re-Evaluation  05/25/19    Authorization Type  [employer], 60 visit limit per discipline    PT Start Time  1515    PT Stop Time  1600    PT Time Calculation (min)  45 min    Equipment Utilized During Treatment  Gait belt    Activity Tolerance  Patient tolerated treatment well;No increased pain    Behavior During Therapy  Flat affect       Past Medical History:  Diagnosis Date  . AKI (acute kidney injury) ([place]) 2018   "from overdose"  . Anxiety   . Daily headache   . Depression   . Drug overdose 04/2016   [name] 05/01/2016  . GERD (gastroesophageal reflux disease)    "when I was younger; gone now" (09/21/2017)  . Migraine    "q couple weeks" (09/21/2017)  . Overdose   . Stroke [facility])     Past Surgical History:  Procedure Laterality Date  . APPENDECTOMY  04/2013  . I & D EXTREMITY Left 09/20/2017   Procedure: IRRIGATION AND DEBRIDEMENT LEFT ARM;  Surgeon: [name], MD;  Location: [place];  Service: Orthopedics;  Laterality: Left;  . TEE WITHOUT CARDIOVERSION N/A 02/10/2019   Procedure: TRANSESOPHAGEAL ECHOCARDIOGRAM (TEE);  Surgeon: [name], MD;  Location: [facility];  Service: Cardiovascular;  Laterality: N/A;  Polysubstance abuser  . TONGUE SURGERY  ~ 2007   "related to lisp"  . TRACHEOSTOMY  04/2016   [name] 05/01/2016    There were no vitals filed for this visit.  Subjective Assessment - 05/09/19 1515    Subjective  Patient is doing well today. She reports that she needed some time to process what happened to her husband last week.    Pertinent  History  Pt is 24 y.o. female presenting to hospital 02/07/19 initially,  MRI showing acute infarct R MCA territory affecting basal ganglia and affecting cortical and subcortical brain in a largely watershed distribution. After in hospital stay patient transitioned to CIR from 02/16/2019-03/01/2019.  PMH includes IV drug abuse, anxiety, depression, migraines, AKI, hypoxic ischemic encephalopathy 2018, opiate overdose 2018, h/o trach 2018, h/o I&D L UE. PLOF: lives with boyfriend in one story apartment, prior to CVA patient was independent, working.    Limitations  Walking;Reading;Lifting;Writing;House hold activities;Standing    How long can you sit comfortably?  NA    How long can you stand comfortably?  20 mins    How long can you walk comfortably?  20 mins    Diagnostic tests  see MRI results    Patient Stated Goals  Patient wants to maximize how much she can walk, get back to normal    Currently in Pain?  No/denies    Pain Score  0-No pain        Treatment: Nu-step x 5 mins , L 5 LE only and assist to keep her L hip in position TM walking .9 miles /  Hour x 10 mins elevation 1, backwards ambulation on TM . 3 miles / hour x 4 mins  Matrix 22.5 lbs x 5 reps fwd/ bwd, side stepping left and right , CGA  Leg press 40 lbs x 20 x 2 Pt educated throughout session about proper posture and technique with exercises. Improved exercise technique, movement at target joints, use of target muscles after min to mod verbal, visual, tactile cues.                       PT Education - 05/09/19 1723    Education provided  Yes    Education Details  LE strenthening    Person(s) Educated  Patient    Methods  Demonstration    Comprehension  Verbalized understanding;Returned demonstration;Need further instruction       PT Short Term Goals - 04/25/19 1627      PT SHORT TERM GOAL #1   Title  Pt will be independent with initial HEP in order to indicate improved strength and decreased fall  risk.    Baseline  initial HEP administered on eval 03/02/2019; 04/25/19: "not as much as should."    Time  6    Period  Weeks    Status  Partially Met    Target Date  04/13/19        PT Long Term Goals - 04/25/19 1628      PT LONG TERM GOAL #1   Title  Pt will be independent with final HEP in order to indicate improved strength and decreased fall risk.    Baseline  initial HEP administered on eval 03/02/19; 04/25/19: "not as much as a should."    Time  12    Period  Weeks    Status  On-going    Target Date  05/25/19      PT LONG TERM GOAL #2   Title  Pt will decrease 5TSTS to less than 10 seconds in order to demonstrate clinically significant improvement in LE strength and functional mobility.    Baseline  03/02/2019: 17 seconds without UE support, dependent on RLE, very little weight shift to LLE; 04/25/19: 7.7s, no UE, decreased weight shifting to LLE;    Time  12    Period  Weeks    Status  Achieved      PT LONG TERM GOAL #3   Title  Pt will perform 6MWT > 10107f with LRAD at mod I level in order to indicate safe community/leisure negotiation.    Baseline  03/02/2019 5315f 04/25/19: 1000'    Time  12    Period  Weeks    Status  Partially Met    Target Date  05/25/19      PT LONG TERM GOAL #4   Title  The patient will demonstrate at least 1/2 grade improvement in LE MMT grade to improve ability to perform functional activities.    Baseline  see eval for details 03/02/2019; 04/25/19: see visit note, significant improvement, still lacking L anikle dorsiflexion, eversion, or inversion    Time  12    Period  Weeks    Status  Partially Met    Target Date  05/25/19      PT LONG TERM GOAL #5   Title  Pt will ambulate >1 m/s with LRAD mod I to indicate gait velocity of community ambulator.    Baseline  03/02/19: self selected: .57 m/s, fastest .7231mwith quad cane; 04/25/19: Self-selected: 10.8s = 0.93 m/s; Fastest: 8.8s = 1.14 m/s, performed with no AD    Time  12    Period  Weeks     Status  Partially Met    Target Date  05/25/19            Plan - 05/09/19 1724    Clinical Impression Statement  Patient instructed in intermediate strengthening and gait  exercise.  Patient requires min Vcs for correct exercise technique including to improve LE control during gait. . Patient demonstrates better balance and LLE control during matrix gait.  Patient would benefit from additional skilled PT intervention to improve balance/gait safety and reduce fall risk.   Personal Factors and Comorbidities  Behavior Pattern    Examination-Activity Limitations  Bathing;Dressing;Sit;Transfers;Sleep;Bed Mobility;Bend;Caring for Others;Carry;Toileting;Reach Overhead;Stand;Locomotion Level;Stairs;Squat;Lift;Hygiene/Grooming    Examination-Participation Restrictions  Church;Interpersonal Relationship;Personal Finances;Yard Work;Cleaning;Laundry;School;Community Activity;Medication Management;Driving;Meal Prep;Shop    Rehab Potential  Good    Clinical Impairments Affecting Rehab Potential  unsure of severity of cognitive/psych deficits    PT Frequency  2x / week    PT Duration  12 weeks    PT Treatment/Interventions  ADLs/Self Care Home Management;DME Instruction;Gait training;Stair training;Functional mobility training;Therapeutic activities;Therapeutic exercise;Balance training;Neuromuscular re-education;Cognitive remediation;Patient/family education;Vestibular;Energy conservation;Cryotherapy;Ultrasound;Electrical Stimulation;Fluidtherapy;Iontophoresis 35m/ml Dexamethasone;Canalith Repostioning;Moist Heat;Traction;Passive range of motion;Manual techniques;Joint Manipulations;Spinal Manipulations;Splinting;Taping;Scar mobilization;Wheelchair mobility training;Prosthetic Training;Orthotic Fit/Training;Dry needling;Visual/perceptual remediation/compensation;Aquatic Therapy    PT Next Visit Plan  continue with LE strengthening, NME, and motor control activities    PT Home Exercise Plan  Access Code:  [email or web address]    Consulted and Agree with Plan of Care  Patient       Patient will benefit from skilled therapeutic intervention in order to improve the following deficits and impairments:  Decreased activity tolerance, Decreased balance, Decreased cognition, Decreased endurance, Decreased knowledge of use of DME, Decreased mobility, Decreased strength, Impaired perceived functional ability, Postural dysfunction, Abnormal gait, Difficulty walking, Impaired tone, Decreased range of motion, Decreased coordination, Impaired UE functional use, Pain  Visit Diagnosis: Muscle weakness (generalized)  Other lack of coordination  Other abnormalities of gait and mobility  Right middle cerebral artery stroke (HCC)  Acute pain of left shoulder  Ischemic stroke diagnosed during current admission ([place]  Frontal lobe and executive function deficit     Problem List Patient Active Problem List   Diagnosis Date Noted  . Spastic hemiparesis ([place]   . Elevated BUN   . Vascular headache   . Mood disorder in conditions classified elsewhere   . Right middle cerebral artery stroke ([place] 02/16/2019  . Polysubstance abuse ([facility]   . Dyslipidemia   . Ischemic stroke diagnosed during current admission ([facility] 02/13/2019  . MDD (major depressive disorder), recurrent episode, moderate ([facility] 02/10/2019  . Dysphagia 02/09/2019  . Elevated CK 02/09/2019  . CVA (cerebral vascular accident) ([facility] 02/07/2019  . Cellulitis of arm 09/20/2017  . IV drug abuse ([facility] 09/20/2017  . Quadriceps weakness 06/18/2016  . TBI (traumatic brain injury) ([facility] 05/01/2016  . 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 Respiratory distress   . Glasgow coma scale total score 3-8 ([facility]   . Pneumonia of both lower lobes due to methicillin resistant Staphylococcus aureus (MRSA) ([place]   . Acute pulmonary edema (HCC)   . Non-traumatic rhabdomyolysis   . Opioid abuse ([facility]   . Elevated troponin   . Acute respiratory failure with  hypoxia ([place]   . Drug overdose   . Elevated liver enzymes   . Encephalopathy acute   . Encephalopathy 04/19/2016  . Opiate overdose ([place] 04/19/2016    MAlanson Puls PT DPT 05/09/2019, 5:25 PM  [place] [address]  [place], [place], [address] Phone: [phone]   Fax:  [phone]  Name: Eline Geng MRN: [id number] Date of Birth: 1995/07/15

## 2019-05-11 ENCOUNTER — Ambulatory Visit: Payer: 59

## 2019-05-11 ENCOUNTER — Encounter: Payer: 59 | Admitting: Occupational Therapy

## 2019-05-12 ENCOUNTER — Other Ambulatory Visit: Payer: Self-pay

## 2019-05-12 ENCOUNTER — Ambulatory Visit: Payer: 59 | Attending: Physical Medicine & Rehabilitation | Admitting: Physical Therapy

## 2019-05-12 ENCOUNTER — Encounter: Payer: Self-pay | Admitting: Physical Therapy

## 2019-05-12 DIAGNOSIS — I639 Cerebral infarction, unspecified: Secondary | ICD-10-CM | POA: Insufficient documentation

## 2019-05-12 DIAGNOSIS — M25512 Pain in left shoulder: Secondary | ICD-10-CM

## 2019-05-12 DIAGNOSIS — R278 Other lack of coordination: Secondary | ICD-10-CM | POA: Insufficient documentation

## 2019-05-12 DIAGNOSIS — R2689 Other abnormalities of gait and mobility: Secondary | ICD-10-CM | POA: Diagnosis present

## 2019-05-12 DIAGNOSIS — I63511 Cerebral infarction due to unspecified occlusion or stenosis of right middle cerebral artery: Secondary | ICD-10-CM | POA: Diagnosis present

## 2019-05-12 DIAGNOSIS — M6281 Muscle weakness (generalized): Secondary | ICD-10-CM

## 2019-05-12 DIAGNOSIS — R41844 Frontal lobe and executive function deficit: Secondary | ICD-10-CM | POA: Insufficient documentation

## 2019-05-12 NOTE — Therapy (Signed)
[place] MAIN [facility] SERVICES [address] [place], [place], [address] Phone: [phone]   Fax:  [phone]  Physical Therapy Treatment  Patient Details  Name: Jane Watkins MRN: [id number] Date of Birth: May 10, 1995 Referring Provider (PT): Dr. Earnest Conroy. [name]   Encounter Date: 05/12/2019  PT End of Session - 05/12/19 1737    Visit Number  14    Number of Visits  24    Date for PT Re-Evaluation  05/25/19    Authorization Type  [employer], 60 visit limit per discipline    PT Start Time  0415    PT Stop Time  0500    PT Time Calculation (min)  45 min    Equipment Utilized During Treatment  Gait belt    Activity Tolerance  Patient tolerated treatment well;No increased pain    Behavior During Therapy  Flat affect       Past Medical History:  Diagnosis Date  . AKI (acute kidney injury) ([place]) 2018   "from overdose"  . Anxiety   . Daily headache   . Depression   . Drug overdose 04/2016   [name] 05/01/2016  . GERD (gastroesophageal reflux disease)    "when I was younger; gone now" (09/21/2017)  . Migraine    "q couple weeks" (09/21/2017)  . Overdose   . Stroke [facility])     Past Surgical History:  Procedure Laterality Date  . APPENDECTOMY  04/2013  . I & D EXTREMITY Left 09/20/2017   Procedure: IRRIGATION AND DEBRIDEMENT LEFT ARM;  Surgeon: [name], MD;  Location: [place];  Service: Orthopedics;  Laterality: Left;  . TEE WITHOUT CARDIOVERSION N/A 02/10/2019   Procedure: TRANSESOPHAGEAL ECHOCARDIOGRAM (TEE);  Surgeon: [name], MD;  Location: [facility];  Service: Cardiovascular;  Laterality: N/A;  Polysubstance abuser  . TONGUE SURGERY  ~ 2007   "related to lisp"  . TRACHEOSTOMY  04/2016   [name] 05/01/2016    There were no vitals filed for this visit.  Subjective Assessment - 05/12/19 1737    Subjective  Patient is doing well today.    Pertinent History  Pt is 25 y.o. female presenting to hospital 02/07/19 initially,  MRI showing acute  infarct R MCA territory affecting basal ganglia and affecting cortical and subcortical brain in a largely watershed distribution. After in hospital stay patient transitioned to CIR from 02/16/2019-03/01/2019.  PMH includes IV drug abuse, anxiety, depression, migraines, AKI, hypoxic ischemic encephalopathy 2018, opiate overdose 2018, h/o trach 2018, h/o I&D L UE. PLOF: lives with boyfriend in one story apartment, prior to CVA patient was independent, working.    Limitations  Walking;Reading;Lifting;Writing;House hold activities;Standing    How long can you sit comfortably?  NA    How long can you stand comfortably?  20 mins    How long can you walk comfortably?  20 mins    Diagnostic tests  see MRI results    Patient Stated Goals  Patient wants to maximize how much she can walk, get back to normal    Currently in Pain?  No/denies    Pain Score  0-No pain        Treatment: Nu-step x 5 mins , L 5 LE only and assist to keep her L hip in position TM walking  1.3 miles /  Hour x 10 mins elevation 2, backwards ambulation on TM . 3 miles / hour x 4 mins, elevation 3 Matrix 22.5 lbs x 5 reps fwd/ bwd, side stepping left and right ,  CGA  Leg press 40 lbs x 20 x 2 Pt educated throughout session about proper posture and technique with exercises. Improved exercise technique, movement at target joints, use of target muscles after min to mod verbal, visual, tactile cues.                        PT Education - 05/12/19 1737    Education provided  Yes    Education Details  HEP    Person(s) Educated  Patient    Methods  Explanation    Comprehension  Verbalized understanding;Returned demonstration       PT Short Term Goals - 04/25/19 1627      PT SHORT TERM GOAL #1   Title  Pt will be independent with initial HEP in order to indicate improved strength and decreased fall risk.    Baseline  initial HEP administered on eval 03/02/2019; 04/25/19: "not as much as should."    Time  6     Period  Weeks    Status  Partially Met    Target Date  04/13/19        PT Long Term Goals - 04/25/19 1628      PT LONG TERM GOAL #1   Title  Pt will be independent with final HEP in order to indicate improved strength and decreased fall risk.    Baseline  initial HEP administered on eval 03/02/19; 04/25/19: "not as much as a should."    Time  12    Period  Weeks    Status  On-going    Target Date  05/25/19      PT LONG TERM GOAL #2   Title  Pt will decrease 5TSTS to less than 10 seconds in order to demonstrate clinically significant improvement in LE strength and functional mobility.    Baseline  03/02/2019: 17 seconds without UE support, dependent on RLE, very little weight shift to LLE; 04/25/19: 7.7s, no UE, decreased weight shifting to LLE;    Time  12    Period  Weeks    Status  Achieved      PT LONG TERM GOAL #3   Title  Pt will perform 6MWT > 1076f with LRAD at mod I level in order to indicate safe community/leisure negotiation.    Baseline  03/02/2019 5329f 04/25/19: 1000'    Time  12    Period  Weeks    Status  Partially Met    Target Date  05/25/19      PT LONG TERM GOAL #4   Title  The patient will demonstrate at least 1/2 grade improvement in LE MMT grade to improve ability to perform functional activities.    Baseline  see eval for details 03/02/2019; 04/25/19: see visit note, significant improvement, still lacking L anikle dorsiflexion, eversion, or inversion    Time  12    Period  Weeks    Status  Partially Met    Target Date  05/25/19      PT LONG TERM GOAL #5   Title  Pt will ambulate >1 m/s with LRAD mod I to indicate gait velocity of community ambulator.    Baseline  03/02/19: self selected: .57 m/s, fastest .7257mwith quad cane; 04/25/19: Self-selected: 10.8s = 0.93 m/s; Fastest: 8.8s = 1.14 m/s, performed with no AD    Time  12    Period  Weeks    Status  Partially Met    Target Date  05/25/19  Plan - 05/12/19 1738    Clinical Impression  Statement  p    Personal Factors and Comorbidities  Behavior Pattern    Examination-Activity Limitations  Bathing;Dressing;Sit;Transfers;Sleep;Bed Mobility;Bend;Caring for Others;Carry;Toileting;Reach Overhead;Stand;Locomotion Level;Stairs;Squat;Lift;Hygiene/Grooming    Examination-Participation Restrictions  Church;Interpersonal Relationship;Personal Finances;Yard Work;Cleaning;Laundry;School;Community Activity;Medication Management;Driving;Meal Prep;Shop    Rehab Potential  Good    Clinical Impairments Affecting Rehab Potential  unsure of severity of cognitive/psych deficits    PT Frequency  2x / week    PT Duration  12 weeks    PT Treatment/Interventions  ADLs/Self Care Home Management;DME Instruction;Gait training;Stair training;Functional mobility training;Therapeutic activities;Therapeutic exercise;Balance training;Neuromuscular re-education;Cognitive remediation;Patient/family education;Vestibular;Energy conservation;Cryotherapy;Ultrasound;Electrical Stimulation;Fluidtherapy;Iontophoresis 4m/ml Dexamethasone;Canalith Repostioning;Moist Heat;Traction;Passive range of motion;Manual techniques;Joint Manipulations;Spinal Manipulations;Splinting;Taping;Scar mobilization;Wheelchair mobility training;Prosthetic Training;Orthotic Fit/Training;Dry needling;Visual/perceptual remediation/compensation;Aquatic Therapy    PT Next Visit Plan  continue with LE strengthening, NME, and motor control activities    PT Home Exercise Plan  Access Code: [email or web address]    Consulted and Agree with Plan of Care  Patient       Patient will benefit from skilled therapeutic intervention in order to improve the following deficits and impairments:  Decreased activity tolerance, Decreased balance, Decreased cognition, Decreased endurance, Decreased knowledge of use of DME, Decreased mobility, Decreased strength, Impaired perceived functional ability, Postural dysfunction, Abnormal gait, Difficulty walking, Impaired tone, Decreased  range of motion, Decreased coordination, Impaired UE functional use, Pain  Visit Diagnosis: Muscle weakness (generalized)  Other lack of coordination  Other abnormalities of gait and mobility  Right middle cerebral artery stroke (HCC)  Frontal lobe and executive function deficit  Ischemic stroke diagnosed during current admission ([facility]  Acute pain of left shoulder     Problem List Patient Active Problem List   Diagnosis Date Noted  . Spastic hemiparesis (HV[place]   . Elevated BUN   . Vascular headache   . Mood disorder in conditions classified elsewhere   . Right middle cerebral artery stroke ([place] 02/16/2019  . Polysubstance abuse (HP[place]   . Dyslipidemia   . Ischemic stroke diagnosed during current admission (HL[place] 02/13/2019  . MDD (major depressive disorder), recurrent episode, moderate (HM[place] 02/10/2019  . Dysphagia 02/09/2019  . Elevated CK 02/09/2019  . CVA (cerebral vascular accident) ([facility] 02/07/2019  . Cellulitis of arm 09/20/2017  . IV drug abuse ([facility] 09/20/2017  . Quadriceps weakness 06/18/2016  . TBI (traumatic brain injury) (HM[place] 05/01/2016  . 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 Respiratory distress   . Glasgow coma scale total score 3-8 ([facility]   . Pneumonia of both lower lobes due to methicillin resistant Staphylococcus aureus (MRSA) (HLe[place]   . Acute pulmonary edema (HCC)   . Non-traumatic rhabdomyolysis   . Opioid abuse (HH[place]   . Elevated troponin   . Acute respiratory failure with hypoxia ([facility]   . Drug overdose   . Elevated liver enzymes   . Encephalopathy acute   . Encephalopathy 04/19/2016  . Opiate overdose ([place] 04/19/2016    Jane Watkins, KMonroe PT DPT 05/12/2019, 5:39 PM  [place] [address] [address]Phone: [phone]  Fax:  [phone] Name: Jane MinichielloMRN: [id number] of Birth: 6July 05, 1997

## 2019-05-14 ENCOUNTER — Other Ambulatory Visit: Payer: Self-pay | Admitting: Neurology

## 2019-05-16 ENCOUNTER — Ambulatory Visit: Payer: 59 | Admitting: Physical Therapy

## 2019-05-16 ENCOUNTER — Other Ambulatory Visit: Payer: Self-pay

## 2019-05-16 ENCOUNTER — Ambulatory Visit: Payer: 59 | Admitting: Occupational Therapy

## 2019-05-16 DIAGNOSIS — R278 Other lack of coordination: Secondary | ICD-10-CM

## 2019-05-16 DIAGNOSIS — M6281 Muscle weakness (generalized): Secondary | ICD-10-CM | POA: Diagnosis not present

## 2019-05-16 DIAGNOSIS — I63511 Cerebral infarction due to unspecified occlusion or stenosis of right middle cerebral artery: Secondary | ICD-10-CM

## 2019-05-17 NOTE — Progress Notes (Signed)
Kindly inform the patient that transcranial Doppler bubble study did not show any evidence of hole in the heart.

## 2019-05-18 ENCOUNTER — Encounter: Payer: Self-pay | Admitting: Occupational Therapy

## 2019-05-18 ENCOUNTER — Other Ambulatory Visit: Payer: Self-pay

## 2019-05-18 MED ORDER — TOPIRAMATE 50 MG PO TABS: 100.0000 mg | ORAL_TABLET | Freq: Two times a day (BID) | ORAL | 0 refills | Status: DC

## 2019-05-18 NOTE — Therapy (Signed)
[place] MAIN [facility] SERVICES [address] [place], [place], [address] Phone: [phone]   Fax:  [phone]  Occupational Therapy Treatment  Patient Details  Name: Jane Watkins MRN: [id number] Date of Birth: 09/13/1995 Referring Provider (OT): Dr. [name]   Encounter Date: 05/16/2019  OT End of Session - 05/18/19 1858    Visit Number  14    Number of Visits  24    Date for OT Re-Evaluation  05/30/19    Authorization Type  Progress report period starting 03/07/2019    OT Start Time  1351    OT Stop Time  1430    OT Time Calculation (min)  39 min    Activity Tolerance  Patient tolerated treatment well    Behavior During Therapy  Flat affect;WFL for tasks assessed/performed       Past Medical History:  Diagnosis Date  . AKI (acute kidney injury) ([place]) 2018   "from overdose"  . Anxiety   . Daily headache   . Depression   . Drug overdose 04/2016   [name] 05/01/2016  . GERD (gastroesophageal reflux disease)    "when I was younger; gone now" (09/21/2017)  . Migraine    "q couple weeks" (09/21/2017)  . Overdose   . Stroke [facility])     Past Surgical History:  Procedure Laterality Date  . APPENDECTOMY  04/2013  . I & D EXTREMITY Left 09/20/2017   Procedure: IRRIGATION AND DEBRIDEMENT LEFT ARM;  Surgeon: [name], MD;  Location: [place];  Service: Orthopedics;  Laterality: Left;  . TEE WITHOUT CARDIOVERSION N/A 02/10/2019   Procedure: TRANSESOPHAGEAL ECHOCARDIOGRAM (TEE);  Surgeon: [name], MD;  Location: [facility];  Service: Cardiovascular;  Laterality: N/A;  Polysubstance abuser  . TONGUE SURGERY  ~ 2007   "related to lisp"  . TRACHEOSTOMY  04/2016   [name] 05/01/2016    There were no vitals filed for this visit.  Subjective Assessment - 05/18/19 1441    Subjective   Patient reports she is doing well, hasn't decided if she really wants to get botox in her hand for thumb and hand spasticity.    Pertinent History  Per chart.  Pt. is a 24 y.o.female with a history of IV drug use, polysubstance as well as tobacco abuse, anxiety. Pt. presented to [facility] on 02/07/2019 after being found unresponsive.  EMS contacted patient received Narcan with some improvement in mental status.  Admission labs with WBC 20,700, potassium 5.2, BUN 25, creatinine 0.67, urine drug screen positive cocaine, marijuana and benzodiazepines, alcohol level less than 10, SARS coronavirus negative.  Cranial CT scan showed areas of indistinct low-density in the right basal ganglia with mass-effect on the right lateral ventricle.  Patient did not receive TPA.  MRI showed acute infarction of right MCA territory, affecting the basal ganglia and affecting the cortical and subcortical brain in a largely watershed distribution. Pt. had a previous inpatient rehab admission 05/01/2016 to 05/09/2016 for hypoxic encephalopathy after drug overdose requiring tracheostomy, and was later decannulated. Pt. resides with her boyfriend in an apartment. 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    Patient Stated Goals  Patient reports she would like to be as independent as possible with all her daily tasks.    Currently in Pain?  No/denies    Pain Score  0-No pain     Therapeutic Exercise: Patient seen for UB ROM and strengthening with use of UBE with resistance of 1.5 to 1.8  for 5 mins, forwards/backwards and alternating levels of resistance, therapist in constant attendance and assisting with turning at times, ensuring grip.   Patient seen for reassessment of measurements this date, refer to flowsheet for details.   Neuromuscular reeducation: Graded pressure tasks with working towards modulation of grip with use of Styrofoam cup regular, then use of Slotted cup for increased complexity of task.  Patient with increased difficulty with picking up pegs from 9 hole peg test, switched to ball pegs with emphasis on manipulation however difficulty with turning.   Able to remove ball pegs if they are placed. Increased thumb tone, decreased isolated movements   Response to tx: Patient has shown excellent progress in the last few weeks but continues to demonstrate difficulty with picking up and manipulation of 9 hole peg test.  Patient can remove pegs but unable to pick up and place into grid.  Increased flexor tone in thumb and fingers limiting hand use and grasping patterns.  Difficulty at times placing fingers around cup for full hand grasp. Although she has increased spasticity in the hand, she is unsure if she wants to receive botox injections. Continue OT to maximize safety and independence in daily tasks.    [facility] OT Assessment - 05/18/19 1901      Coordination   9 Hole Peg Test  Right;Left    Right 9 Hole Peg Test  22    Left 9 Hole Peg Test  unable to turn to put into board    Coordination  able to take out pegs from 9 hole peg test but not able to place.        Hand Function   Right Hand Grip (lbs)  45    Right Hand Lateral Pinch  15 lbs    Right Hand 3 Point Pinch  17 lbs    Left Hand Grip (lbs)  20    Left Hand Lateral Pinch  6 lbs    Left 3 point pinch  8 lbs                       OT Education - 05/18/19 1858    Education Details  thumb movements, grasping patterns    Methods  Explanation;Demonstration;Tactile cues;Verbal cues;Handout    Comprehension  Verbalized understanding;Returned demonstration;Need further instruction          OT Long Term Goals - 04/25/19 1529      OT LONG TERM GOAL #1   Title  Pt. will increase active isolated left shoulder flexion by 20 degrees in preparation for combing her hair.    Baseline  04/25/2019: Shoulder flexion 132. Pt. is unable to use her left hand to brush her hair. Eval: No isolated shoulder shoulder flexion. Pt. compensates with synergystic movement with attempts for flexion.    Time  12    Period  Weeks    Status  Partially Met    Target Date  05/30/19      OT LONG  TERM GOAL #2   Title  Pt. will increase active shoulder abduction by 20 degrees in preparation to be able to wash her hair.    Baseline  04/25/2019: shoulder abduction:95. Pt. is unable to use her LUE to wash her hair. Eval: Active shoulder abduction 56 degrees.    Time  12    Period  Weeks    Status  Partially Met    Target Date  05/30/19      OT LONG TERM GOAL #3  Title  Pt. will improve hand to face patterns using her left hand to be able to independently wash her face.    Baseline  04/25/2019: Pt. is able to perfrom hand to face patterns with her left hand, however is not able to use for left hand to wash her face.Eval: Pt. is unable to wash her face using her left hand    Time  12    Period  Weeks    Status  Partially Met    Target Date  05/30/19      OT LONG TERM GOAL #4   Title  Pt. will increase AROM wrist extension by 10 degrees in preparation for reaching for a cup    Baseline  04/25/2019: Wrist extension 54. Pt. conitnues to work to reaching for a cup with her left hand. Eval: 0 degrees of active wrist extension.    Time  12    Period  Weeks    Status  Partially Met    Target Date  06/09/19      OT LONG TERM GOAL #5   Title  Pt. will increase left digit flexion to be able to initiate actively holding a toothbrush.    Baseline  04/25/2019: Pt. is indpendently able to hold, and stabilize a toorhbursh with her left hand while applying toothpaste. pt. is unable to use her left hand to brush her teeth. Eval: Pt. is unable to use her left hand to hold a toothbrush.    Time  12    Period  Weeks    Status  Partially Met    Target Date  05/30/19            Plan - 05/18/19 1859    Clinical Impression Statement  Patient has shown excellent progress in the last few weeks but continues to demonstrate difficulty with picking up and manipulation of 9 hole peg test.  Patient can remove pegs but unable to pick up and place into grid.  Increased flexor tone in thumb and fingers limiting  hand use and grasping patterns.  Difficulty at times placing fingers around cup for full hand grasp. Although she has increased spasticity in the hand, she is unsure if she wants to receive botox injections. Continue OT to maximize safety and independence in daily tasks.    OT Occupational Profile and History  Detailed Assessment- Review of Records and additional review of physical, cognitive, psychosocial history related to current functional performance    Occupational performance deficits (Please refer to evaluation for details):  ADL's;IADL's    Body Structure / Function / Physical Skills  ADL;IADL;Coordination;Endurance;UE functional use;Decreased knowledge of precautions;Dexterity;FMC;ROM;Tone;Strength    Rehab Potential  Good    Clinical Decision Making  Several treatment options, min-mod task modification necessary    Comorbidities Affecting Occupational Performance:  May have comorbidities impacting occupational performance    Modification or Assistance to Complete Evaluation   Min-Moderate modification of tasks or assist with assess necessary to complete eval    OT Frequency  2x / week    OT Duration  12 weeks    OT Treatment/Interventions  Self-care/ADL training;DME and/or AE instruction;Energy conservation;Therapeutic activities;Patient/family education;Therapeutic exercise;Neuromuscular education    Consulted and Agree with Plan of Care  Patient       Patient will benefit from skilled therapeutic intervention in order to improve the following deficits and impairments:   Body Structure / Function / Physical Skills: ADL, IADL, Coordination, Endurance, UE functional use, Decreased knowledge of precautions, Dexterity, FMC, ROM,  Tone, Strength       Visit Diagnosis: Muscle weakness (generalized)  Other lack of coordination  Right middle cerebral artery stroke [facility])    Problem List Patient Active Problem List   Diagnosis Date Noted  . Spastic hemiparesis ([place])   . Elevated  BUN   . Vascular headache   . Mood disorder in conditions classified elsewhere   . 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 Polysubstance abuse ([place])   . Dyslipidemia   . Ischemic stroke diagnosed during current admission ([place]) 02/13/2019  . MDD (major depressive disorder), recurrent episode, moderate ([place]) 02/10/2019  . Dysphagia 02/09/2019  . Elevated CK 02/09/2019  . CVA (cerebral vascular accident) ([place]) 02/07/2019  . Cellulitis of arm 09/20/2017  . IV drug abuse ([place]) 09/20/2017  . Quadriceps weakness 06/18/2016  . TBI (traumatic brain injury) ([place]) 05/01/2016  . 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 Respiratory distress   . Glasgow coma scale total score 3-8 ([place])   . Pneumonia of both lower lobes due to methicillin resistant Staphylococcus aureus (MRSA) ([place])   . Acute pulmonary edema (H[facility])   . Non-traumatic rhabdomyolysis   . Opioid abuse ([place])   . Elevated troponin   . Acute respiratory failure with hypoxia ([place])   . Drug overdose   . Elevated liver enzymes   . Encephalopathy acute   . Encephalopathy 04/19/2016  . Opiate overdose ([place]) 04/19/2016   Jane Watkins, OTR/L, CLT  Jane Watkins 05/18/2019, 7:14 PM  [place] MAIN [facility] SERVICES [address] [place], [place], [address] Phone: [phone]   Fax:  [phone]  Name: Jane Watkins MRN: [id number] Date of Birth: 02-04-1996

## 2019-05-19 ENCOUNTER — Encounter: Payer: Self-pay | Admitting: Occupational Therapy

## 2019-05-19 ENCOUNTER — Encounter: Payer: Self-pay | Admitting: Physical Therapy

## 2019-05-19 ENCOUNTER — Ambulatory Visit: Payer: 59 | Admitting: Occupational Therapy

## 2019-05-19 ENCOUNTER — Other Ambulatory Visit: Payer: Self-pay

## 2019-05-19 ENCOUNTER — Ambulatory Visit: Payer: 59 | Admitting: Physical Therapy

## 2019-05-19 DIAGNOSIS — M6281 Muscle weakness (generalized): Secondary | ICD-10-CM

## 2019-05-19 DIAGNOSIS — R278 Other lack of coordination: Secondary | ICD-10-CM

## 2019-05-19 DIAGNOSIS — R2689 Other abnormalities of gait and mobility: Secondary | ICD-10-CM

## 2019-05-19 DIAGNOSIS — I63511 Cerebral infarction due to unspecified occlusion or stenosis of right middle cerebral artery: Secondary | ICD-10-CM

## 2019-05-19 DIAGNOSIS — R41844 Frontal lobe and executive function deficit: Secondary | ICD-10-CM

## 2019-05-19 DIAGNOSIS — I639 Cerebral infarction, unspecified: Secondary | ICD-10-CM

## 2019-05-19 DIAGNOSIS — M25512 Pain in left shoulder: Secondary | ICD-10-CM

## 2019-05-19 NOTE — Therapy (Signed)
[place] MAIN [facility] SERVICES [address] [place], [place], [address] Phone: [phone]   Fax:  [phone]  Physical Therapy Treatment  Patient Details  Name: Jane Watkins MRN: [id number] Date of Birth: 1995-11-02 Referring Provider (PT): Dr. Earnest Conroy. [name]   Encounter Date: 05/19/2019  PT End of Session - 05/19/19 1407    Visit Number  15    Number of Visits  24    Date for PT Re-Evaluation  05/25/19    Authorization Type  [employer], 60 visit limit per discipline    PT Start Time  0200    PT Stop Time  0240    PT Time Calculation (min)  40 min    Equipment Utilized During Treatment  Gait belt    Activity Tolerance  Patient tolerated treatment well;No increased pain    Behavior During Therapy  Flat affect       Past Medical History:  Diagnosis Date  . AKI (acute kidney injury) ([place]) 2018   "from overdose"  . Anxiety   . Daily headache   . Depression   . Drug overdose 04/2016   [name] 05/01/2016  . GERD (gastroesophageal reflux disease)    "when I was younger; gone now" (09/21/2017)  . Migraine    "q couple weeks" (09/21/2017)  . Overdose   . Stroke [facility])     Past Surgical History:  Procedure Laterality Date  . APPENDECTOMY  04/2013  . I & D EXTREMITY Left 09/20/2017   Procedure: IRRIGATION AND DEBRIDEMENT LEFT ARM;  Surgeon: [name], MD;  Location: [place];  Service: Orthopedics;  Laterality: Left;  . TEE WITHOUT CARDIOVERSION N/A 02/10/2019   Procedure: TRANSESOPHAGEAL ECHOCARDIOGRAM (TEE);  Surgeon: [name], MD;  Location: [facility] OR[facility];  Service: Cardiovascular;  Laterality: N/A;  Polysubstance abuser  . TONGUE SURGERY  ~ 2007   "related to lisp"  . TRACHEOSTOMY  04/2016   [name] 05/01/2016    There were no vitals filed for this visit.  Subjective Assessment - 05/19/19 1406    Subjective  Patient is doing well today.    Patient is accompained by:  --   significant other   Pertinent History  Pt is 24 y.o. female  presenting to hospital 02/07/19 initially,  MRI showing acute infarct R MCA territory affecting basal ganglia and affecting cortical and subcortical brain in a largely watershed distribution. After in hospital stay patient transitioned to CIR from 02/16/2019-03/01/2019.  PMH includes IV drug abuse, anxiety, depression, migraines, AKI, hypoxic ischemic encephalopathy 2018, opiate overdose 2018, h/o trach 2018, h/o I&D L UE. PLOF: lives with boyfriend in one story apartment, prior to CVA patient was independent, working.    Limitations  Walking;Reading;Lifting;Writing;House hold activities;Standing    How long can you sit comfortably?  NA    How long can you stand comfortably?  20 mins    How long can you walk comfortably?  20 mins    Diagnostic tests  see MRI results    Patient Stated Goals  Patient wants to maximize how much she can walk, get back to normal    Currently in Pain?  No/denies    Pain Score  0-No pain    Pain Onset  Today        Treatment: TM walking backwards . 4 miles / hour , elevation 2  TM walking sidestepping Left and right . 4 no UE , elevation 2 TM 1.7 miles / hour elevation 2, x 7 mins Matrix 4 plates fwd  x 5, bwd x 5, side stepping leading with right x 5  Leg press 70 lbs x 20 x 2 Patient performed with instruction, verbal cues, tactile cues of therapist: goal: increase tissue extensibility, promote proper posture, improve mobility                       PT Education - 05/19/19 1406    Education provided  Yes    Education Details  HEP    Person(s) Educated  Patient    Methods  Explanation    Comprehension  Verbalized understanding       PT Short Term Goals - 04/25/19 1627      PT SHORT TERM GOAL #1   Title  Pt will be independent with initial HEP in order to indicate improved strength and decreased fall risk.    Baseline  initial HEP administered on eval 03/02/2019; 04/25/19: "not as much as should."    Time  6    Period  Weeks    Status   Partially Met    Target Date  04/13/19        PT Long Term Goals - 04/25/19 1628      PT LONG TERM GOAL #1   Title  Pt will be independent with final HEP in order to indicate improved strength and decreased fall risk.    Baseline  initial HEP administered on eval 03/02/19; 04/25/19: "not as much as a should."    Time  12    Period  Weeks    Status  On-going    Target Date  05/25/19      PT LONG TERM GOAL #2   Title  Pt will decrease 5TSTS to less than 10 seconds in order to demonstrate clinically significant improvement in LE strength and functional mobility.    Baseline  03/02/2019: 17 seconds without UE support, dependent on RLE, very little weight shift to LLE; 04/25/19: 7.7s, no UE, decreased weight shifting to LLE;    Time  12    Period  Weeks    Status  Achieved      PT LONG TERM GOAL #3   Title  Pt will perform 6MWT > 1021f with LRAD at mod I level in order to indicate safe community/leisure negotiation.    Baseline  03/02/2019 5321f 04/25/19: 1000'    Time  12    Period  Weeks    Status  Partially Met    Target Date  05/25/19      PT LONG TERM GOAL #4   Title  The patient will demonstrate at least 1/2 grade improvement in LE MMT grade to improve ability to perform functional activities.    Baseline  see eval for details 03/02/2019; 04/25/19: see visit note, significant improvement, still lacking L anikle dorsiflexion, eversion, or inversion    Time  12    Period  Weeks    Status  Partially Met    Target Date  05/25/19      PT LONG TERM GOAL #5   Title  Pt will ambulate >1 m/s with LRAD mod I to indicate gait velocity of community ambulator.    Baseline  03/02/19: self selected: .57 m/s, fastest .727mwith quad cane; 04/25/19: Self-selected: 10.8s = 0.93 m/s; Fastest: 8.8s = 1.14 m/s, performed with no AD    Time  12    Period  Weeks    Status  Partially Met    Target Date  05/25/19  Plan - 05/19/19 1451    Clinical Impression Statement  Patient is  improving with her dynamic balance and strength in LLE and is able to perform high level strengthening and balance challenges today with increased speed and greater incline with TM. She continues to need the AFO for gait due to ankle weakness and instability. She will continue to benefit from skilled PT to improve mobility and strength. .    Personal Factors and Comorbidities  Behavior Pattern    Examination-Activity Limitations  Bathing;Dressing;Sit;Transfers;Sleep;Bed Mobility;Bend;Caring for Others;Carry;Toileting;Reach Overhead;Stand;Locomotion Level;Stairs;Squat;Lift;Hygiene/Grooming    Examination-Participation Restrictions  Church;Interpersonal Relationship;Personal Finances;Yard Work;Cleaning;Laundry;School;Community Activity;Medication Management;Driving;Meal Prep;Shop    Rehab Potential  Good    Clinical Impairments Affecting Rehab Potential  unsure of severity of cognitive/psych deficits    PT Frequency  2x / week    PT Duration  12 weeks    PT Treatment/Interventions  ADLs/Self Care Home Management;DME Instruction;Gait training;Stair training;Functional mobility training;Therapeutic activities;Therapeutic exercise;Balance training;Neuromuscular re-education;Cognitive remediation;Patient/family education;Vestibular;Energy conservation;Cryotherapy;Ultrasound;Electrical Stimulation;Fluidtherapy;Iontophoresis 30m/ml Dexamethasone;Canalith Repostioning;Moist Heat;Traction;Passive range of motion;Manual techniques;Joint Manipulations;Spinal Manipulations;Splinting;Taping;Scar mobilization;Wheelchair mobility training;Prosthetic Training;Orthotic Fit/Training;Dry needling;Visual/perceptual remediation/compensation;Aquatic Therapy    PT Next Visit Plan  continue with LE strengthening, NME, and motor control activities    PT Home Exercise Plan  Access Code: [email or web address]    Consulted and Agree with Plan of Care  Patient       Patient will benefit from skilled therapeutic intervention in order to improve  the following deficits and impairments:  Decreased activity tolerance, Decreased balance, Decreased cognition, Decreased endurance, Decreased knowledge of use of DME, Decreased mobility, Decreased strength, Impaired perceived functional ability, Postural dysfunction, Abnormal gait, Difficulty walking, Impaired tone, Decreased range of motion, Decreased coordination, Impaired UE functional use, Pain  Visit Diagnosis: Muscle weakness (generalized)  Other lack of coordination  Right middle cerebral artery stroke (HCC)  Other abnormalities of gait and mobility  Frontal lobe and executive function deficit  Ischemic stroke diagnosed during current admission ([facility]  Acute pain of left shoulder     Problem List Patient Active Problem List   Diagnosis Date Noted  . Spastic hemiparesis ([place]   . Elevated BUN   . Vascular headache   . Mood disorder in conditions classified elsewhere   . Right middle cerebral artery stroke ([place] 02/16/2019  . Polysubstance abuse ([place]   . Dyslipidemia   . Ischemic stroke diagnosed during current admission ([place] 02/13/2019  . MDD (major depressive disorder), recurrent episode, moderate ([place] 02/10/2019  . Dysphagia 02/09/2019  . Elevated CK 02/09/2019  . CVA (cerebral vascular accident) ([place] 02/07/2019  . Cellulitis of arm 09/20/2017  . IV drug abuse ([place] 09/20/2017  . Quadriceps weakness 06/18/2016  . TBI (traumatic brain injury) ([place] 05/01/2016  . 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 Respiratory distress   . Glasgow coma scale total score 3-8 ([place]   . Pneumonia of both lower lobes due to methicillin resistant Staphylococcus aureus (MRSA) ([place]   . Acute pulmonary edema (HCC)   . Non-traumatic rhabdomyolysis   . Opioid abuse ([place]   . Elevated troponin   . Acute respiratory failure with hypoxia ([place]   . Drug overdose   . Elevated liver enzymes   . Encephalopathy acute   . Encephalopathy 04/19/2016  . Opiate overdose ([place]  04/19/2016    MArelia SneddonS,PT DPT 05/19/2019, 2:52 PM  [place] [facility] [address] [address]Phone: [phone]  Fax:  [phone] Name: Jane HoffmanMRN: [id number] of Birth: 601/22/1997

## 2019-05-19 NOTE — Therapy (Signed)
[place] MAIN [facility] SERVICES [address] [place], [place], [address] Phone: [phone]   Fax:  [phone]  Occupational Therapy Treatment  Patient Details  Name: Jane Watkins MRN: [id number] Date of Birth: Feb 09, 1996 Referring Provider (OT): Dr. [name]   Encounter Date: 05/19/2019  OT End of Session - 05/19/19 1459    Visit Number  15    Number of Visits  24    Date for OT Re-Evaluation  05/30/19    Authorization Type  Progress report period starting 03/07/2019    OT Start Time  1452    OT Stop Time  1530    OT Time Calculation (min)  38 min    Activity Tolerance  Patient tolerated treatment well    Behavior During Therapy  Flat affect       Past Medical History:  Diagnosis Date  . AKI (acute kidney injury) ([place]) 2018   "from overdose"  . Anxiety   . Daily headache   . Depression   . Drug overdose 04/2016   [name] 05/01/2016  . GERD (gastroesophageal reflux disease)    "when I was younger; gone now" (09/21/2017)  . Migraine    "q couple weeks" (09/21/2017)  . Overdose   . Stroke [facility])     Past Surgical History:  Procedure Laterality Date  . APPENDECTOMY  04/2013  . I & D EXTREMITY Left 09/20/2017   Procedure: IRRIGATION AND DEBRIDEMENT LEFT ARM;  Surgeon: [name], MD;  Location: [place];  Service: Orthopedics;  Laterality: Left;  . TEE WITHOUT CARDIOVERSION N/A 02/10/2019   Procedure: TRANSESOPHAGEAL ECHOCARDIOGRAM (TEE);  Surgeon: [name], MD;  Location: [facility];  Service: Cardiovascular;  Laterality: N/A;  Polysubstance abuser  . TONGUE SURGERY  ~ 2007   "related to lisp"  . TRACHEOSTOMY  04/2016   [name] 05/01/2016    There were no vitals filed for this visit.  Subjective Assessment - 05/19/19 1458    Subjective   Patient reports she is doing well, hasn't decided if she really wants to get botox in her hand for thumb and hand spasticity.    Pertinent History  Per chart. Pt. is a 24 y.o.female with a  history of IV drug use, polysubstance as well as tobacco abuse, anxiety. Pt. presented to [facility] on 02/07/2019 after being found unresponsive.  EMS contacted patient received Narcan with some improvement in mental status.  Admission labs with WBC 20,700, potassium 5.2, BUN 25, creatinine 0.67, urine drug screen positive cocaine, marijuana and benzodiazepines, alcohol level less than 10, SARS coronavirus negative.  Cranial CT scan showed areas of indistinct low-density in the right basal ganglia with mass-effect on the right lateral ventricle.  Patient did not receive TPA.  MRI showed acute infarction of right MCA territory, affecting the basal ganglia and affecting the cortical and subcortical brain in a largely watershed distribution. Pt. had a previous inpatient rehab admission 05/01/2016 to 05/09/2016 for hypoxic encephalopathy after drug overdose requiring tracheostomy, and was later decannulated. Pt. resides with her boyfriend in an apartment. 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    Patient Stated Goals  Patient reports she would like to be as independent as possible with all her daily tasks.    Currently in Pain?  No/denies      OT TREATMENT   Neuro muscular re-education:  Pt. worked on left hand [facility] skills grasping1" circular pegs, and stored them in the palm of her hand.  Pt.  Worked on prehension patterns grasping the circular spheres with her thumb, and 2nd digit, and worked on using her thumb to turn the pegs in her hand to prepare for placing them onto the pegboard.  Pt. worked on Digit extension at the tabletop. Pt. Was able to actively extend 2nd digit, and 3rd digits. Thumb opposition to the 3rd digit. Thumb abduction, and extension.  Pt. continues to make steady progress with LUE functioning, and [facility] skills.  Pt. continues to make progress with graspingwith her 2nd digit, and thumb, as well as extending completely off of objects. Pt. Continues to respond  well to inhibitory techniques, weightbearing with a flat hand at the tabletop surface.Pt. Has difficulty using her hand for storage, and turning objects in her hand incorporating thumb movements. Pt. Presents with increased compensation proximally when reaching to the far end of the pegboard. Pt. continues to work on improving LUE functioning in order toimprove functional reaching, and normalizing tone to improve grasping, and prehension patterns, and increaseengagement during ADLs, and IADL tasks.                       OT Education - 05/19/19 1459    Education Details  thumb movements, grasping patterns    Person(s) Educated  Patient    Methods  Explanation;Demonstration;Tactile cues;Verbal cues;Handout    Comprehension  Verbalized understanding;Returned demonstration;Need further instruction          OT Long Term Goals - 04/25/19 1529      OT LONG TERM GOAL #1   Title  Pt. will increase active isolated left shoulder flexion by 20 degrees in preparation for combing her hair.    Baseline  04/25/2019: Shoulder flexion 132. Pt. is unable to use her left hand to brush her hair. Eval: No isolated shoulder shoulder flexion. Pt. compensates with synergystic movement with attempts for flexion.    Time  12    Period  Weeks    Status  Partially Met    Target Date  05/30/19      OT LONG TERM GOAL #2   Title  Pt. will increase active shoulder abduction by 20 degrees in preparation to be able to wash her hair.    Baseline  04/25/2019: shoulder abduction:95. Pt. is unable to use her LUE to wash her hair. Eval: Active shoulder abduction 56 degrees.    Time  12    Period  Weeks    Status  Partially Met    Target Date  05/30/19      OT LONG TERM GOAL #3   Title  Pt. will improve hand to face patterns using her left hand to be able to independently wash her face.    Baseline  04/25/2019: Pt. is able to perfrom hand to face patterns with her left hand, however is not able to use  for left hand to wash her face.Eval: Pt. is unable to wash her face using her left hand    Time  12    Period  Weeks    Status  Partially Met    Target Date  05/30/19      OT LONG TERM GOAL #4   Title  Pt. will increase AROM wrist extension by 10 degrees in preparation for reaching for a cup    Baseline  04/25/2019: Wrist extension 54. Pt. conitnues to work to reaching for a cup with her left hand. Eval: 0 degrees of active wrist extension.    Time  12  Period  Weeks    Status  Partially Met    Target Date  06/09/19      OT LONG TERM GOAL #5   Title  Pt. will increase left digit flexion to be able to initiate actively holding a toothbrush.    Baseline  04/25/2019: Pt. is indpendently able to hold, and stabilize a toorhbursh with her left hand while applying toothpaste. pt. is unable to use her left hand to brush her teeth. Eval: Pt. is unable to use her left hand to hold a toothbrush.    Time  12    Period  Weeks    Status  Partially Met    Target Date  05/30/19            Plan - 05/19/19 1500    Clinical Impression Statement Pt. continues to make steady progress with LUE functioning, and [facility] skills.  Pt. continues to make progress with graspingwith her 2nd digit, and thumb, as well as extending completely off of objects. Pt. Continues to respond well to inhibitory techniques, weightbearing with a flat hand at the tabletop surface.Pt. Has difficulty using her hand for storage, and turning objects in her hand incorporating thumb movements. Pt. Presents with increased compensation proximally when reaching to the far end of the pegboard. Pt. continues to work on improving LUE functioning in order toimprove functional reaching, and normalizing tone to improve grasping, and prehension patterns, and increaseengagement during ADLs, and IADL tasks.   OT Occupational Profile and History  Detailed Assessment- Review of Records and additional review of physical, cognitive, psychosocial  history related to current functional performance    Occupational performance deficits (Please refer to evaluation for details):  ADL's;IADL's    Body Structure / Function / Physical Skills  ADL;IADL;Coordination;Endurance;UE functional use;Decreased knowledge of precautions;Dexterity;FMC;ROM;Tone;Strength    Rehab Potential  Good    Clinical Decision Making  Several treatment options, min-mod task modification necessary    Comorbidities Affecting Occupational Performance:  May have comorbidities impacting occupational performance    Modification or Assistance to Complete Evaluation   Min-Moderate modification of tasks or assist with assess necessary to complete eval    OT Frequency  2x / week    OT Duration  12 weeks    OT Treatment/Interventions  Self-care/ADL training;DME and/or AE instruction;Energy conservation;Therapeutic activities;Patient/family education;Therapeutic exercise;Neuromuscular education       Patient will benefit from skilled therapeutic intervention in order to improve the following deficits and impairments:   Body Structure / Function / Physical Skills: ADL, IADL, Coordination, Endurance, UE functional use, Decreased knowledge of precautions, Dexterity, FMC, ROM, Tone, Strength       Visit Diagnosis: Muscle weakness (generalized)  Other lack of coordination    Problem List Patient Active Problem List   Diagnosis Date Noted  . Spastic hemiparesis ([place])   . Elevated BUN   . Vascular headache   . Mood disorder in conditions classified elsewhere   . 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 Polysubstance abuse ([place])   . Dyslipidemia   . Ischemic stroke diagnosed during current admission ([place]) 02/13/2019  . MDD (major depressive disorder), recurrent episode, moderate ([place]) 02/10/2019  . Dysphagia 02/09/2019  . Elevated CK 02/09/2019  . CVA (cerebral vascular accident) ([place]) 02/07/2019  . Cellulitis of arm 09/20/2017  . IV drug abuse ([place])  09/20/2017  . Quadriceps weakness 06/18/2016  . TBI (traumatic brain injury) ([place]) 05/01/2016  . 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 Respiratory distress   . Glasgow coma  scale total score 3-8 ([place])   . Pneumonia of both lower lobes due to methicillin resistant Staphylococcus aureus (MRSA) ([place])   . Acute pulmonary edema ([facility])   . Non-traumatic rhabdomyolysis   . Opioid abuse ([place])   . Elevated troponin   . Acute respiratory failure with hypoxia ([place])   . Drug overdose   . Elevated liver enzymes   . Encephalopathy acute   . Encephalopathy 04/19/2016  . Opiate overdose ([place]) 04/19/2016    Harrel Carina, MS, OTR/L 05/19/2019, 3:04 PM  [place] MAIN [facility] SERVICES [address] [place], [place], [address] Phone: [phone]   Fax:  [phone]  Name: Jane Watkins MRN: [id number] Date of Birth: 25-Apr-1995

## 2019-05-22 ENCOUNTER — Other Ambulatory Visit: Payer: Self-pay | Admitting: Physical Medicine and Rehabilitation

## 2019-05-23 ENCOUNTER — Ambulatory Visit: Payer: 59

## 2019-05-23 ENCOUNTER — Ambulatory Visit: Payer: 59 | Admitting: Occupational Therapy

## 2019-05-24 ENCOUNTER — Encounter: Payer: 59 | Attending: Physical Medicine and Rehabilitation | Admitting: Physical Medicine and Rehabilitation

## 2019-05-24 ENCOUNTER — Encounter: Payer: Self-pay | Admitting: Physical Medicine and Rehabilitation

## 2019-05-24 ENCOUNTER — Other Ambulatory Visit: Payer: Self-pay

## 2019-05-24 ENCOUNTER — Encounter: Payer: Self-pay | Admitting: Family Medicine

## 2019-05-24 ENCOUNTER — Ambulatory Visit (INDEPENDENT_AMBULATORY_CARE_PROVIDER_SITE_OTHER): Payer: 59 | Admitting: Family Medicine

## 2019-05-24 VITALS — BP 98/60 | HR 71 | Temp 98.0°F | Ht 63.75 in | Wt 121.2 lb

## 2019-05-24 VITALS — BP 116/65 | HR 78 | Ht 62.5 in | Wt 121.0 lb

## 2019-05-24 DIAGNOSIS — G8110 Spastic hemiplegia affecting unspecified side: Secondary | ICD-10-CM | POA: Diagnosis not present

## 2019-05-24 DIAGNOSIS — F0630 Mood disorder due to known physiological condition, unspecified: Secondary | ICD-10-CM | POA: Diagnosis not present

## 2019-05-24 DIAGNOSIS — G44221 Chronic tension-type headache, intractable: Secondary | ICD-10-CM | POA: Diagnosis present

## 2019-05-24 DIAGNOSIS — F5102 Adjustment insomnia: Secondary | ICD-10-CM | POA: Insufficient documentation

## 2019-05-24 DIAGNOSIS — I63511 Cerebral infarction due to unspecified occlusion or stenosis of right middle cerebral artery: Secondary | ICD-10-CM | POA: Diagnosis present

## 2019-05-24 DIAGNOSIS — F411 Generalized anxiety disorder: Secondary | ICD-10-CM | POA: Diagnosis present

## 2019-05-24 DIAGNOSIS — R748 Abnormal levels of other serum enzymes: Secondary | ICD-10-CM | POA: Diagnosis not present

## 2019-05-24 DIAGNOSIS — R799 Abnormal finding of blood chemistry, unspecified: Secondary | ICD-10-CM

## 2019-05-24 DIAGNOSIS — N390 Urinary tract infection, site not specified: Secondary | ICD-10-CM

## 2019-05-24 DIAGNOSIS — Z3009 Encounter for other general counseling and advice on contraception: Secondary | ICD-10-CM

## 2019-05-24 DIAGNOSIS — N3000 Acute cystitis without hematuria: Secondary | ICD-10-CM | POA: Insufficient documentation

## 2019-05-24 DIAGNOSIS — I6330 Cerebral infarction due to thrombosis of unspecified cerebral artery: Secondary | ICD-10-CM

## 2019-05-24 LAB — COMPREHENSIVE METABOLIC PANEL
ALT: 10 U/L (ref 0–35)
AST: 9 U/L (ref 0–37)
Albumin: 4.5 g/dL (ref 3.5–5.2)
Alkaline Phosphatase: 51 U/L (ref 39–117)
BUN: 19 mg/dL (ref 6–23)
CO2: 21 mEq/L (ref 19–32)
Calcium: 9.4 mg/dL (ref 8.4–10.5)
Chloride: 113 mEq/L — ABNORMAL HIGH (ref 96–112)
Creatinine, Ser: 0.71 mg/dL (ref 0.40–1.20)
GFR: 101.27 mL/min (ref >60.00)
Glucose, Bld: 83 mg/dL (ref 70–99)
Potassium: 4 mEq/L (ref 3.5–5.1)
Sodium: 140 mEq/L (ref 135–145)
Total Bilirubin: 0.5 mg/dL (ref 0.2–1.2)
Total Protein: 6.7 g/dL (ref 6.0–8.3)

## 2019-05-24 LAB — POCT URINALYSIS DIPSTICK
Bilirubin, UA: NEGATIVE
Blood, UA: NEGATIVE
Glucose, UA: NEGATIVE
Ketones, UA: NEGATIVE
Nitrite, UA: POSITIVE
Protein, UA: NEGATIVE
Spec Grav, UA: 1.02 (ref 1.010–1.025)
Urobilinogen, UA: 0.2 E.U./dL
pH, UA: 7 (ref 5.0–8.0)

## 2019-05-24 MED ORDER — NITROFURANTOIN MONOHYD MACRO 100 MG PO CAPS: 100.0000 mg | ORAL_CAPSULE | Freq: Two times a day (BID) | ORAL | 0 refills | Status: AC

## 2019-05-24 MED ORDER — ZOLPIDEM TARTRATE 5 MG PO TABS: 5.0000 mg | ORAL_TABLET | Freq: Every evening | ORAL | 0 refills | Status: DC | PRN

## 2019-05-24 NOTE — Assessment & Plan Note (Signed)
In setting of hospitalization. Will repeat.

## 2019-05-24 NOTE — Assessment & Plan Note (Signed)
Pt with depression and anxiety and now with chronic disability following stroke. Endorses SI but no plan. Therapy would likely be very helpful, but given complexity she may benefit from psychiatry as well for medication support given lack of improvement on current regimen

## 2019-05-24 NOTE — Progress Notes (Signed)
Subjective:     Jane Watkins is a 24 y.o. female presenting for Establish Care (no PCP in a while but has seen neurology and PT.), Urinary Frequency (hx of recurrent UTIs. ), and Referral (psychology)     HPI  #polysubtance abuse - stopped cocaine and heroin - still using marijuana  #Tobacco use - has chantix and nicotine patches at home - just starting  #Urinary Frequency - current symptoms - no burning with urination - UTI 04/07/2019 - treated and symptoms improved - endorses frequency now which prior infection resolved  - notices that these occur after intercourse - does urinate after intercourse - and wipes front to back - intercourse ~3 times per week  #depression/anxiety - tired an online zoom call and it was focused on CBT therapy - currently seeing neurology who is prescribing some medication - is on prozac, buspar, and seroquel since her stroke December/Jan - was started on prozac as she was on this previously - think she is doing pretty well - has a lot of trauma from her addiction  - needs a therapist to help her work through what she is experiencing - dealing with SI but no plan - has a lot of physical   #birth control - not interested in pregnancy - was on OCPs in the past and skipped cycle but this concerned her - stopped - migraines w/o aura  Review of Systems   Social History   Tobacco Use  Smoking Status Current Every Day Smoker  . Packs/day: 0.50  . Years: 10.00  . Pack years: 5.00  . Types: Cigarettes  Smokeless Tobacco Never Used        Objective:    BP Readings from Last 3 Encounters:  05/24/19 98/60  04/19/19 102/65  04/14/19 114/73   Wt Readings from Last 3 Encounters:  05/24/19 121 lb 4 oz (55 kg)  04/19/19 122 lb (55.3 kg)  04/07/19 122 lb (55.3 kg)    BP 98/60   Pulse 71   Temp 98 F (36.7 C)   Ht 5' 3.75" (1.619 m)   Wt 121 lb 4 oz (55 kg)   LMP 05/02/2019   SpO2 98%   BMI 20.98 kg/m    Physical  Exam Constitutional:      General: She is not in acute distress.    Appearance: She is well-developed. She is not diaphoretic.  HENT:     Right Ear: External ear normal.     Left Ear: External ear normal.  Eyes:     Conjunctiva/sclera: Conjunctivae normal.  Cardiovascular:     Rate and Rhythm: Normal rate.  Pulmonary:     Effort: Pulmonary effort is normal.  Musculoskeletal:     Cervical back: Neck supple.  Skin:    General: Skin is warm and dry.     Capillary Refill: Capillary refill takes less than 2 seconds.  Neurological:     Mental Status: She is alert. Mental status is at baseline.  Psychiatric:        Mood and Affect: Mood normal.        Behavior: Behavior normal.      Depression screen [facility] 2/9 05/24/2019  Decreased Interest 3  Down, Depressed, Hopeless 3  PHQ - 2 Score 6  Altered sleeping 3  Tired, decreased energy 3  Change in appetite 2  Feeling bad or failure about yourself  2  Trouble concentrating 2  Moving slowly or fidgety/restless 0  Suicidal thoughts 3  PHQ-9 Score 21  Difficult doing work/chores Somewhat difficult        Assessment & Plan:   Problem List Items Addressed This Visit      Genitourinary   Postcoital UTI    Pt with recurrent symptoms associated with intercourse. Currently has UTI discussed treating and then return for test of cure. If cleared will start nitrofurantoin for prevention 100 mg w/in 2 hours of intercourse. Treat current with 5 days of nitrofurantoin.       Relevant Medications   nitrofurantoin, macrocrystal-monohydrate, (MACROBID) 100 MG capsule   RESOLVED: Acute cystitis without hematuria   Relevant Medications   nitrofurantoin, macrocrystal-monohydrate, (MACROBID) 100 MG capsule   Other Relevant Orders   POCT urinalysis dipstick (Completed)   Urine Culture     Other   Elevated liver enzymes   Relevant Orders   Comprehensive metabolic panel   Mood disorder in conditions classified elsewhere - Primary    Pt  with depression and anxiety and now with chronic disability following stroke. Endorses SI but no plan. Therapy would likely be very helpful, but given complexity she may benefit from psychiatry as well for medication support given lack of improvement on current regimen      Relevant Orders   Ambulatory referral to Psychiatry   Ambulatory referral to Psychology   Elevated BUN    In setting of hospitalization. Will repeat.       Relevant Orders   Comprehensive metabolic panel    Other Visit Diagnoses    Birth control counseling         Discussed birth control options and hand out provided. Pt unsure today, but will call back or discuss further at follow-up appointment.   Return in about 2 weeks (around 06/07/2019).  [name], MD

## 2019-05-24 NOTE — Patient Instructions (Addendum)
Great to meet you today!  Therapy:  -- [facility] is one option. Call [phone] -- Or you can check out [email or web address] -- you can read bios of therapists and see if they accept insurance -- Check with your insurance to see if you have coverage and who may take your insurance  Referral to psychiatry  #Referral I have placed a referral to a specialist for you. You should receive a phone call from the specialty office. Make sure your voicemail is not full and that if you are able to answer your phone to unknown or new numbers.   It may take up to 2 weeks to hear about the referral. If you do not hear anything in 2 weeks, please call our office and ask to speak with the referral coordinator.   For Urinary Tract Infection Prevention 1) Continue hygiene practices - urinating after intercourse and wiping front to back 2) Take 100 mg (1 pill) of Nitrofurantoin within 2 hours of intercourse

## 2019-05-24 NOTE — Assessment & Plan Note (Signed)
Pt with recurrent symptoms associated with intercourse. Currently has UTI discussed treating and then return for test of cure. If cleared will start nitrofurantoin for prevention 100 mg w/in 2 hours of intercourse. Treat current with 5 days of nitrofurantoin.

## 2019-05-24 NOTE — Progress Notes (Signed)
Botox Injection for spasticity using needle EMG guidance  Indication: Severe spasticity which interferes with ADL,mobility and/or  hygiene and is unresponsive to medication management and other conservative care Informed consent was obtained after describing risks and benefits of the procedure with the patient. This includes bleeding, bruising, infection, excessive weakness, or medication side effects. A REMS form is on file and signed. Left soleus: 30U Left gastrocnemius: 40U Left flexor pollicis longus: 20U Left adductor pollicis: 10U.   All injections were done after obtaining appropriate EMG activity and after negative drawback for blood. The patient tolerated the procedure well. Post procedure instructions were given. A followup appointment was made.

## 2019-05-25 ENCOUNTER — Telehealth: Payer: Self-pay

## 2019-05-25 NOTE — Telephone Encounter (Signed)
-----   Message from Micki Riley, MD sent at 05/17/2019  9:19 PM EDT ----- Joneen Roach inform the patient that transcranial Doppler bubble study did not show any evidence of hole in the heart.

## 2019-05-25 NOTE — Telephone Encounter (Signed)
I called pt that her that transcranial Doppler bubble study did not show any evidence of hole in the heart.Pt verbalized understanding.

## 2019-05-26 ENCOUNTER — Other Ambulatory Visit: Payer: Self-pay

## 2019-05-26 ENCOUNTER — Ambulatory Visit: Payer: 59 | Admitting: Occupational Therapy

## 2019-05-26 ENCOUNTER — Ambulatory Visit: Payer: 59 | Admitting: Physical Therapy

## 2019-05-26 ENCOUNTER — Encounter: Payer: Self-pay | Admitting: Occupational Therapy

## 2019-05-26 ENCOUNTER — Encounter: Payer: Self-pay | Admitting: Physical Therapy

## 2019-05-26 DIAGNOSIS — I639 Cerebral infarction, unspecified: Secondary | ICD-10-CM

## 2019-05-26 DIAGNOSIS — I63511 Cerebral infarction due to unspecified occlusion or stenosis of right middle cerebral artery: Secondary | ICD-10-CM

## 2019-05-26 DIAGNOSIS — R2689 Other abnormalities of gait and mobility: Secondary | ICD-10-CM

## 2019-05-26 DIAGNOSIS — R41844 Frontal lobe and executive function deficit: Secondary | ICD-10-CM

## 2019-05-26 DIAGNOSIS — M6281 Muscle weakness (generalized): Secondary | ICD-10-CM | POA: Diagnosis not present

## 2019-05-26 DIAGNOSIS — M25512 Pain in left shoulder: Secondary | ICD-10-CM

## 2019-05-26 DIAGNOSIS — R278 Other lack of coordination: Secondary | ICD-10-CM

## 2019-05-26 LAB — URINE CULTURE
MICRO NUMBER:: 10357459
SPECIMEN QUALITY:: ADEQUATE

## 2019-05-26 NOTE — Therapy (Signed)
[place] MAIN [facility] SERVICES [address] [place], [place], [address] Phone: [phone]   Fax:  [phone]  Occupational Therapy Treatment  Patient Details  Name: Jane Watkins MRN: [id number] Date of Birth: 1995-04-03 Referring Provider (OT): Dr. [name]   Encounter Date: 05/26/2019  OT End of Session - 05/26/19 1155    Visit Number  16    Number of Visits  24    Date for OT Re-Evaluation  05/30/19    Authorization Type  Progress report period starting 03/07/2019    OT Start Time  1150    OT Stop Time  1230    OT Time Calculation (min)  40 min    Activity Tolerance  Patient tolerated treatment well    Behavior During Therapy  Flat affect       Past Medical History:  Diagnosis Date  . AKI (acute kidney injury) ([place]) 2018   "from overdose"  . Anxiety   . Daily headache   . Depression   . Drug overdose 04/2016   [name] 05/01/2016  . GERD (gastroesophageal reflux disease)    "when I was younger; gone now" (09/21/2017)  . Migraine    "q couple weeks" (09/21/2017)  . Overdose   . Stroke [facility])     Past Surgical History:  Procedure Laterality Date  . APPENDECTOMY  04/2013  . I & D EXTREMITY Left 09/20/2017   Procedure: IRRIGATION AND DEBRIDEMENT LEFT ARM;  Surgeon: [name], MD;  Location: [place];  Service: Orthopedics;  Laterality: Left;  . TEE WITHOUT CARDIOVERSION N/A 02/10/2019   Procedure: TRANSESOPHAGEAL ECHOCARDIOGRAM (TEE);  Surgeon: [name], MD;  Location: [facility];  Service: Cardiovascular;  Laterality: N/A;  Polysubstance abuser  . TONGUE SURGERY  ~ 2007   "related to lisp"  . TRACHEOSTOMY  04/2016   [name] 05/01/2016    There were no vitals filed for this visit.  Subjective Assessment - 05/26/19 1154    Subjective   Patient reports she is doing well, hasn't decided if she really wants to get botox in her hand for thumb and hand spasticity.    Pertinent History  Per chart. Pt. is a 24 y.o.female with a 24 y.o. history of IV drug use, polysubstance as well as tobacco abuse, anxiety. Pt. presented to [facility] on 02/07/2019 after being found unresponsive.  EMS contacted patient received Narcan with some improvement in mental status.  Admission labs with WBC 20,700, potassium 5.2, BUN 25, creatinine 0.67, urine drug screen positive cocaine, marijuana and benzodiazepines, alcohol level less than 10, SARS coronavirus negative.  Cranial CT scan showed areas of indistinct low-density in the right basal ganglia with mass-effect on the right lateral ventricle.  Patient did not receive TPA.  MRI showed acute infarction of right MCA territory, affecting the basal ganglia and affecting the cortical and subcortical brain in a largely watershed distribution. Pt. had a previous inpatient rehab admission 05/01/2016 to 05/09/2016 for hypoxic encephalopathy after drug overdose requiring tracheostomy, and was later decannulated. Pt. resides with her boyfriend in an apartment. 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    Patient Stated Goals  Patient reports she would like to be as independent as possible with all her daily tasks.    Currently in Pain?  No/denies      OT TREATMENT   Therapeutic Exercise:  Pt. worked on the [employer] for 8 min. with constant monitoring of the BUEs. Pt. worked on the forward position.  Neuro  muscular re-education:  Pt. worked on left hand [facility] skills grasping1" circular pegs, and stored them in the palm of her hand.  Pt. Worked on prehension patterns grasping the circular spheres with her thumb, and 2nd digit, and worked on using her thumb to turn the pegs in her hand to prepare for placing them onto the pegboard.  Pt. worked on Digit extension at the tabletop. Pt. Was able to actively extend 2nd digit, and 3rd digits. Thumb opposition to the 3rd digit. Thumb abduction, and extension.  Pt. reports that she has resumed driving by herself. Pt. drove to therapy today. Pt.  Prepares, and cooks meals. Pt. continues to make steady progress with LUE functioning, and [facility] skills. Pt. continues to make progress withgraspingwith her 2nd digit, and thumb, as well as extendingcompletelyoff of objects. Pt. Continues to respond well to inhibitory techniques, weightbearing with a flat hand at the tabletop surface.Pt. has difficulty using her hand for storage, and turning objects in her hand incorporating thumb movements. Pt. presents with increased compensation proximally when reaching to the far end of the pegboard. Pt. continues to work on improving LUE functioning in order toimprove functional reaching, and normalizing tone to improve grasping, and prehension patterns, and increaseengagement during ADLs, and IADL tasks.                       OT Education - 05/26/19 1155    Education Details  thumb movements, grasping patterns    Person(s) Educated  Patient    Methods  Explanation;Demonstration;Tactile cues;Verbal cues;Handout    Comprehension  Verbalized understanding;Returned demonstration;Need further instruction          OT Long Term Goals - 04/25/19 1529      OT LONG TERM GOAL #1   Title  Pt. will increase active isolated left shoulder flexion by 20 degrees in preparation for combing her hair.    Baseline  04/25/2019: Shoulder flexion 132. Pt. is unable to use her left hand to brush her hair. Eval: No isolated shoulder shoulder flexion. Pt. compensates with synergystic movement with attempts for flexion.    Time  12    Period  Weeks    Status  Partially Met    Target Date  05/30/19      OT LONG TERM GOAL #2   Title  Pt. will increase active shoulder abduction by 20 degrees in preparation to be able to wash her hair.    Baseline  04/25/2019: shoulder abduction:95. Pt. is unable to use her LUE to wash her hair. Eval: Active shoulder abduction 56 degrees.    Time  12    Period  Weeks    Status  Partially Met    Target Date  05/30/19       OT LONG TERM GOAL #3   Title  Pt. will improve hand to face patterns using her left hand to be able to independently wash her face.    Baseline  04/25/2019: Pt. is able to perfrom hand to face patterns with her left hand, however is not able to use for left hand to wash her face.Eval: Pt. is unable to wash her face using her left hand    Time  12    Period  Weeks    Status  Partially Met    Target Date  05/30/19      OT LONG TERM GOAL #4   Title  Pt. will increase AROM wrist extension by 10 degrees in preparation for reaching  for a cup    Baseline  04/25/2019: Wrist extension 54. Pt. conitnues to work to reaching for a cup with her left hand. Eval: 0 degrees of active wrist extension.    Time  12    Period  Weeks    Status  Partially Met    Target Date  06/09/19      OT LONG TERM GOAL #5   Title  Pt. will increase left digit flexion to be able to initiate actively holding a toothbrush.    Baseline  04/25/2019: Pt. is indpendently able to hold, and stabilize a toorhbursh with her left hand while applying toothpaste. pt. is unable to use her left hand to brush her teeth. Eval: Pt. is unable to use her left hand to hold a toothbrush.    Time  12    Period  Weeks    Status  Partially Met    Target Date  05/30/19            Plan - 05/26/19 1156    Clinical Impression Statement Pt. reports that she has resumed driving by herself. Pt. drove to therapy today. Pt. Prepares, and cooks meals. Pt. continues to make steady progress with LUE functioning, and [facility] skills. Pt. continues to make progress withgraspingwith her 2nd digit, and thumb, as well as extendingcompletelyoff of objects. Pt. Continues to respond well to inhibitory techniques, weightbearing with a flat hand at the tabletop surface.Pt. has difficulty using her hand for storage, and turning objects in her hand incorporating thumb movements. Pt. presents with increased compensation proximally when reaching to the far end of  the pegboard. Pt. continues to work on improving LUE functioning in order toimprove functional reaching, and normalizing tone to improve grasping, and prehension patterns, and increaseengagement during ADLs, and IADL tasks.    OT Occupational Profile and History  Detailed Assessment- Review of Records and additional review of physical, cognitive, psychosocial history related to current functional performance    Occupational performance deficits (Please refer to evaluation for details):  ADL's;IADL's    Body Structure / Function / Physical Skills  ADL;IADL;Coordination;Endurance;UE functional use;Decreased knowledge of precautions;Dexterity;FMC;ROM;Tone;Strength    Rehab Potential  Good    Clinical Decision Making  Several treatment options, min-mod task modification necessary    Comorbidities Affecting Occupational Performance:  May have comorbidities impacting occupational performance    Modification or Assistance to Complete Evaluation   Min-Moderate modification of tasks or assist with assess necessary to complete eval    OT Frequency  2x / week    OT Duration  12 weeks    OT Treatment/Interventions  Self-care/ADL training;DME and/or AE instruction;Energy conservation;Therapeutic activities;Patient/family education;Therapeutic exercise;Neuromuscular education    Consulted and Agree with Plan of Care  Patient       Patient will benefit from skilled therapeutic intervention in order to improve the following deficits and impairments:   Body Structure / Function / Physical Skills: ADL, IADL, Coordination, Endurance, UE functional use, Decreased knowledge of precautions, Dexterity, FMC, ROM, Tone, Strength       Visit Diagnosis: Muscle weakness (generalized)  Other lack of coordination    Problem List Patient Active Problem List   Diagnosis Date Noted  . Postcoital UTI 05/24/2019  . Spastic hemiparesis ([place])   . Elevated BUN   . Vascular headache   . Mood disorder in conditions  classified elsewhere   . 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 Polysubstance abuse ([place])   . Dyslipidemia   . Ischemic stroke  diagnosed during current admission ([place]) 02/13/2019  . MDD (major depressive disorder), recurrent episode, moderate ([place]) 02/10/2019  . CVA (cerebral vascular accident) ([place]) 02/07/2019  . IV drug abuse ([place]) 09/20/2017  . TBI (traumatic brain injury) ([place]) 05/01/2016  . 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 Opioid abuse ([place])   . Elevated troponin   . Elevated liver enzymes     Harrel Carina, MS, OTR/L 05/26/2019, 12:06 PM  [place] MAIN [facility] SERVICES [address] [place], [place], [address] Phone: [phone]   Fax:  [phone]  Name: Jane Watkins MRN: [id number] Date of Birth: [birth year]

## 2019-05-26 NOTE — Therapy (Addendum)
[place] MAIN [facility] SERVICES [address] [place], [place], [address] Phone: [phone]   Fax:  [phone]  Physical Therapy Treatment  Patient Details  Name: Jane Watkins MRN: [id number] Date of Birth: [birth year] Referring Provider (PT): Dr. Earnest Conroy. [name]   Encounter Date: 05/26/2019  PT End of Session - 05/26/19 1156    Visit Number  16    Number of Visits  24    PT Start Time  1109    PT Stop Time  1147    PT Time Calculation (min)  38 min    Behavior During Therapy  Flat affect      Date for PT Re-Evaluation 08/17/19      Past Medical History:  Diagnosis Date  . AKI (acute kidney injury) ([place]) 2018   "from overdose"  . Anxiety   . Daily headache   . Depression   . Drug overdose 04/2016   [name] 05/01/2016  . GERD (gastroesophageal reflux disease)    "when I was younger; gone now" (09/21/2017)  . Migraine    "q couple weeks" (09/21/2017)  . Overdose   . Stroke [facility])     Past Surgical History:  Procedure Laterality Date  . APPENDECTOMY  04/2013  . I & D EXTREMITY Left 09/20/2017   Procedure: IRRIGATION AND DEBRIDEMENT LEFT ARM;  Surgeon: [name], MD;  Location: [place];  Service: Orthopedics;  Laterality: Left;  . TEE WITHOUT CARDIOVERSION N/A 02/10/2019   Procedure: TRANSESOPHAGEAL ECHOCARDIOGRAM (TEE);  Surgeon: [name], MD;  Location: [facility];  Service: Cardiovascular;  Laterality: N/A;  Polysubstance abuser  . TONGUE SURGERY  ~ 2007   "related to lisp"  . TRACHEOSTOMY  04/2016   [name] 05/01/2016    There were no vitals filed for this visit.  Subjective Assessment - 05/26/19 1153    Subjective  Patient is doing well today.She had a botox shot in her right foot and right hand.    Patient is accompained by:  --   significant other   Pertinent History  Pt is 24 y.o. female presenting to hospital 02/07/19 initially,  MRI showing acute infarct R MCA territory affecting basal ganglia and affecting cortical  and subcortical brain in a largely watershed distribution. After in hospital stay patient transitioned to CIR from 02/16/2019-03/01/2019.  PMH includes IV drug abuse, anxiety, depression, migraines, AKI, hypoxic ischemic encephalopathy 2018, opiate overdose 2018, h/o trach 2018, h/o I&D L UE. PLOF: lives with boyfriend in one story apartment, prior to CVA patient was independent, working.    Limitations  Walking;Reading;Lifting;Writing;House hold activities;Standing    How long can you sit comfortably?  NA    How long can you stand comfortably?  20 mins    How long can you walk comfortably?  20 mins    Diagnostic tests  see MRI results    Patient Stated Goals  Patient wants to maximize how much she can walk, get back to normal    Currently in Pain?  No/denies    Pain Score  0-No pain    Pain Onset  Today         Treatment:  TM walking sidestepping Left and right . 4 no UE , elevation 2 TM 1.7 miles / hour elevation 2, x 8 mins Matrix 4 plates fwd x 5, bwd x 5, side stepping leading with right x 5   Patient performed with instruction, verbal cues, tactile cues of therapist: goal:increase tissue extensibility, promote proper posture, improve mobility  PT Education - 05/26/19 1155    Education provided  Yes    Education Details  HEP    Person(s) Educated  Patient    Methods  Explanation    Comprehension  Verbalized understanding       PT Short Term Goals - 04/25/19 1627      PT SHORT TERM GOAL #1   Title  Pt will be independent with initial HEP in order to indicate improved strength and decreased fall risk.    Baseline  initial HEP administered on eval 03/02/2019; 04/25/19: "not as much as should."    Time  6    Period  Weeks    Status  Partially Met    Target Date  04/13/19        PT Long Term Goals - 04/25/19 1628      PT LONG TERM GOAL #1   Title  Pt will be independent with final HEP in order to indicate improved strength and  decreased fall risk.    Baseline  initial HEP administered on eval 03/02/19; 04/25/19: "not as much as a should."    Time  12    Period  Weeks    Status  On-going    Target Date  05/25/19      PT LONG TERM GOAL #2   Title  Pt will decrease 5TSTS to less than 10 seconds in order to demonstrate clinically significant improvement in LE strength and functional mobility.    Baseline  03/02/2019: 17 seconds without UE support, dependent on RLE, very little weight shift to LLE; 04/25/19: 7.7s, no UE, decreased weight shifting to LLE;    Time  12    Period  Weeks    Status  Achieved      PT LONG TERM GOAL #3   Title  Pt will perform 6MWT > 1038f with LRAD at mod I level in order to indicate safe community/leisure negotiation.    Baseline  03/02/2019 5356f 04/25/19: 1000'    Time  12    Period  Weeks    Status  Partially Met    Target Date  05/25/19      PT LONG TERM GOAL #4   Title  The patient will demonstrate at least 1/2 grade improvement in LE MMT grade to improve ability to perform functional activities.    Baseline  see eval for details 03/02/2019; 04/25/19: see visit note, significant improvement, still lacking L anikle dorsiflexion, eversion, or inversion    Time  12    Period  Weeks    Status  Partially Met    Target Date  05/25/19      PT LONG TERM GOAL #5   Title  Pt will ambulate >1 m/s with LRAD mod I to indicate gait velocity of community ambulator.    Baseline  03/02/19: self selected: .57 m/s, fastest .7247mwith quad cane; 04/25/19: Self-selected: 10.8s = 0.93 m/s; Fastest: 8.8s = 1.14 m/s, performed with no AD    Time  12    Period  Weeks    Status  Partially Met    Target Date  05/25/19            Plan - 05/26/19 1157    Clinical Impression Statement  Patient instructed in intermediate strengthening and balance exercise.  Patient requires min Vcs for correct exercise technique including to improve LE control with standing exercise. Patient demonstrates better quad  control with TM tasks with rail assist. Patient would benefit from additional  skilled PT intervention to improve balance/gait safety and reduce fall risk.    Personal Factors and Comorbidities  Behavior Pattern    Examination-Activity Limitations  Bathing;Dressing;Sit;Transfers;Sleep;Bed Mobility;Bend;Caring for Others;Carry;Toileting;Reach Overhead;Stand;Locomotion Level;Stairs;Squat;Lift;Hygiene/Grooming    Examination-Participation Restrictions  Church;Interpersonal Relationship;Personal Finances;Yard Work;Cleaning;Laundry;School;Community Activity;Medication Management;Driving;Meal Prep;Shop    Rehab Potential  Good    Clinical Impairments Affecting Rehab Potential  unsure of severity of cognitive/psych deficits    PT Frequency  2x / week    PT Duration  12 weeks    PT Treatment/Interventions  ADLs/Self Care Home Management;DME Instruction;Gait training;Stair training;Functional mobility training;Therapeutic activities;Therapeutic exercise;Balance training;Neuromuscular re-education;Cognitive remediation;Patient/family education;Vestibular;Energy conservation;Cryotherapy;Ultrasound;Electrical Stimulation;Fluidtherapy;Iontophoresis 5m/ml Dexamethasone;Canalith Repostioning;Moist Heat;Traction;Passive range of motion;Manual techniques;Joint Manipulations;Spinal Manipulations;Splinting;Taping;Scar mobilization;Wheelchair mobility training;Prosthetic Training;Orthotic Fit/Training;Dry needling;Visual/perceptual remediation/compensation;Aquatic Therapy    PT Next Visit Plan  continue with LE strengthening, NME, and motor control activities    PT Home Exercise Plan  Access Code: [email or web address]    Consulted and Agree with Plan of Care  Patient       Patient will benefit from skilled therapeutic intervention in order to improve the following deficits and impairments:  Decreased activity tolerance, Decreased balance, Decreased cognition, Decreased endurance, Decreased knowledge of use of DME, Decreased  mobility, Decreased strength, Impaired perceived functional ability, Postural dysfunction, Abnormal gait, Difficulty walking, Impaired tone, Decreased range of motion, Decreased coordination, Impaired UE functional use, Pain  Visit Diagnosis: Muscle weakness (generalized)  Other lack of coordination  Right middle cerebral artery stroke (H[facility])  Other abnormalities of gait and mobility  Acute pain of left shoulder  Ischemic stroke diagnosed during current admission ([place]  Frontal lobe and executive function deficit     Problem List Patient Active Problem List   Diagnosis Date Noted  . Postcoital UTI 05/24/2019  . Spastic hemiparesis ([place]   . Elevated BUN   . Vascular headache   . Mood disorder in conditions classified elsewhere   . Right middle cerebral artery stroke ([place] 02/16/2019  . Polysubstance abuse ([place]   . Dyslipidemia   . Ischemic stroke diagnosed during current admission ([place] 02/13/2019  . MDD (major depressive disorder), recurrent episode, moderate ([place] 02/10/2019  . CVA (cerebral vascular accident) ([place] 02/07/2019  . IV drug abuse ([place] 09/20/2017  . TBI (traumatic brain injury) ([place] 05/01/2016  . 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 Opioid abuse ([place]   . Elevated troponin   . Elevated liver enzymes     MAlanson Puls PVirginiaDPT 05/26/2019, 12:01 PM  [place] [facility] [address] [place] [address]Phone: [phone]  Fax:  [phone] Name: LJaleen GruppMRN: [id number] of Birth: [birth year]

## 2019-05-30 ENCOUNTER — Ambulatory Visit: Payer: 59

## 2019-05-30 ENCOUNTER — Other Ambulatory Visit: Payer: Self-pay

## 2019-05-30 ENCOUNTER — Ambulatory Visit: Payer: 59 | Admitting: Occupational Therapy

## 2019-05-30 ENCOUNTER — Encounter: Payer: Self-pay | Admitting: Occupational Therapy

## 2019-05-30 DIAGNOSIS — R278 Other lack of coordination: Secondary | ICD-10-CM

## 2019-05-30 DIAGNOSIS — M6281 Muscle weakness (generalized): Secondary | ICD-10-CM

## 2019-05-30 NOTE — Therapy (Signed)
[place] MAIN [facility] SERVICES [address] [place], [place], [address] Phone: [phone]   Fax:  [phone]  Physical Therapy Treatment  Patient Details  Name: Jane Watkins MRN: [id number] Date of Birth: 07/14/1995 Referring Provider (PT): Dr. Earnest Conroy. [name]   Encounter Date: 05/30/2019  PT End of Session - 05/30/19 1356    Visit Number  17    Number of Visits  24    PT Start Time  5003    PT Stop Time  1430    PT Time Calculation (min)  45 min    Activity Tolerance  Patient tolerated treatment well;No increased pain    Behavior During Therapy  Flat affect       Past Medical History:  Diagnosis Date  . AKI (acute kidney injury) ([place]) 2018   "from overdose"  . Anxiety   . Daily headache   . Depression   . Drug overdose 04/2016   [name] 05/01/2016  . GERD (gastroesophageal reflux disease)    "when I was younger; gone now" (09/21/2017)  . Migraine    "q couple weeks" (09/21/2017)  . Overdose   . Stroke [facility])     Past Surgical History:  Procedure Laterality Date  . APPENDECTOMY  04/2013  . I & D EXTREMITY Left 09/20/2017   Procedure: IRRIGATION AND DEBRIDEMENT LEFT ARM;  Surgeon: [name], MD;  Location: [place];  Service: Orthopedics;  Laterality: Left;  . TEE WITHOUT CARDIOVERSION N/A 02/10/2019   Procedure: TRANSESOPHAGEAL ECHOCARDIOGRAM (TEE);  Surgeon: [name], MD;  Location: [facility];  Service: Cardiovascular;  Laterality: N/A;  Polysubstance abuser  . TONGUE SURGERY  ~ 2007   "related to lisp"  . TRACHEOSTOMY  04/2016   [name] 05/01/2016    There were no vitals filed for this visit.  Subjective Assessment - 05/30/19 1345    Subjective  Pt reports that she is doing well. Her legs are tired from being really active over the weekend. Denies any pain upon arrival today. No specific questions or concerns.    Patient is accompained by:  --   significant other   Pertinent History  Pt is 24 y.o. female presenting to  hospital 02/07/19 initially,  MRI showing acute infarct R MCA territory affecting basal ganglia and affecting cortical and subcortical brain in a largely watershed distribution. After in hospital stay patient transitioned to CIR from 02/16/2019-03/01/2019.  PMH includes IV drug abuse, anxiety, depression, migraines, AKI, hypoxic ischemic encephalopathy 2018, opiate overdose 2018, h/o trach 2018, h/o I&D L UE. PLOF: lives with boyfriend in one story apartment, prior to CVA patient was independent, working.    Limitations  Walking;Reading;Lifting;Writing;House hold activities;Standing    How long can you sit comfortably?  NA    How long can you stand comfortably?  20 mins    How long can you walk comfortably?  20 mins    Diagnostic tests  see MRI results    Patient Stated Goals  Patient wants to maximize how much she can walk, get back to normal    Currently in Pain?  No/denies    Pain Onset  --           TREATMENT   Ther-ex NuStep L2 x 5 minutes for warm-up during history BLE only (3 minutes unbilled); Hooklying L SLR x 10, pt cued to initiate with quad set to maintain knee straight; LLE D1 resisted flexion/extension x 10; Supine straight knee hip abduction/adduction with manual resistance x 10 each; Hooklying  clams with manual resistance x 10; Hooklying adductor with manual resistance x 10; Hooklying BLE bridges with RLE slightly extended 3s hold 2 x 10; Manually resisted LLE leg press x 10; R sidelying L hip abduction with manual resistance from therapist 2 x 10;   Pt educated throughout session about proper posture and technique with exercises. Improved exercise technique, movement at target joints, use of target muscles after min to mod verbal, visual, tactile cues.   Patient demonstrates excellent motivation todayduring session however she is able to do less reporting BLE fatigue from the weekend. She is able to complete all supine and sidelying exercises today. She  requests deferring any walking on the treadmill due to leg soreness. She does express some frustration regarding difficulties with her boyfriend at home. Therapist encouraged pt to continue to pursue options for counseling. Pt encouraged to continue HEP.  Ptwill benefit from PT services to address deficits in strength, balance, and mobility in order to return to full function at home.                        PT Short Term Goals - 04/25/19 1627      PT SHORT TERM GOAL #1   Title  Pt will be independent with initial HEP in order to indicate improved strength and decreased fall risk.    Baseline  initial HEP administered on eval 03/02/2019; 04/25/19: "not as much as should."    Time  6    Period  Weeks    Status  Partially Met    Target Date  04/13/19        PT Long Term Goals - 04/25/19 1628      PT LONG TERM GOAL #1   Title  Pt will be independent with final HEP in order to indicate improved strength and decreased fall risk.    Baseline  initial HEP administered on eval 03/02/19; 04/25/19: "not as much as a should."    Time  12    Period  Weeks    Status  On-going    Target Date  05/25/19      PT LONG TERM GOAL #2   Title  Pt will decrease 5TSTS to less than 10 seconds in order to demonstrate clinically significant improvement in LE strength and functional mobility.    Baseline  03/02/2019: 17 seconds without UE support, dependent on RLE, very little weight shift to LLE; 04/25/19: 7.7s, no UE, decreased weight shifting to LLE;    Time  12    Period  Weeks    Status  Achieved      PT LONG TERM GOAL #3   Title  Pt will perform 6MWT > 1014f with LRAD at mod I level in order to indicate safe community/leisure negotiation.    Baseline  03/02/2019 5361f 04/25/19: 1000'    Time  12    Period  Weeks    Status  Partially Met    Target Date  05/25/19      PT LONG TERM GOAL #4   Title  The patient will demonstrate at least 1/2 grade improvement in LE MMT grade to improve  ability to perform functional activities.    Baseline  see eval for details 03/02/2019; 04/25/19: see visit note, significant improvement, still lacking L anikle dorsiflexion, eversion, or inversion    Time  12    Period  Weeks    Status  Partially Met    Target Date  05/25/19  PT LONG TERM GOAL #5   Title  Pt will ambulate >1 m/s with LRAD mod I to indicate gait velocity of community ambulator.    Baseline  03/02/19: self selected: .57 m/s, fastest .26ms with quad cane; 04/25/19: Self-selected: 10.8s = 0.93 m/s; Fastest: 8.8s = 1.14 m/s, performed with no AD    Time  12    Period  Weeks    Status  Partially Met    Target Date  05/25/19            Plan - 05/30/19 1356    Clinical Impression Statement  Patient demonstrates excellent motivation today during session however she is able to do less reporting BLE fatigue from the weekend. She is able to complete all supine and sidelying exercises today. She requests deferring any walking on the treadmill due to leg soreness. She does express some frustration regarding difficulties with her boyfriend at home. Therapist encouraged pt to continue to pursue options for counseling. Pt encouraged to continue HEP.  Pt will benefit from PT services to address deficits in strength, balance, and mobility in order to return to full function at home.    Personal Factors and Comorbidities  Behavior Pattern    Examination-Activity Limitations  Bathing;Dressing;Sit;Transfers;Sleep;Bed Mobility;Bend;Caring for Others;Carry;Toileting;Reach Overhead;Stand;Locomotion Level;Stairs;Squat;Lift;Hygiene/Grooming    Examination-Participation Restrictions  Church;Interpersonal Relationship;Personal Finances;Yard Work;Cleaning;Laundry;School;Community Activity;Medication Management;Driving;Meal Prep;Shop    Rehab Potential  Good    Clinical Impairments Affecting Rehab Potential  unsure of severity of cognitive/psych deficits    PT Frequency  2x / week    PT Duration   12 weeks    PT Treatment/Interventions  ADLs/Self Care Home Management;DME Instruction;Gait training;Stair training;Functional mobility training;Therapeutic activities;Therapeutic exercise;Balance training;Neuromuscular re-education;Cognitive remediation;Patient/family education;Vestibular;Energy conservation;Cryotherapy;Ultrasound;Electrical Stimulation;Fluidtherapy;Iontophoresis 465mml Dexamethasone;Canalith Repostioning;Moist Heat;Traction;Passive range of motion;Manual techniques;Joint Manipulations;Spinal Manipulations;Splinting;Taping;Scar mobilization;Wheelchair mobility training;Prosthetic Training;Orthotic Fit/Training;Dry needling;Visual/perceptual remediation/compensation;Aquatic Therapy    PT Next Visit Plan  continue with LE strengthening, NME, and motor control activities    PT Home Exercise Plan  Access Code: [email or web address]    Consulted and Agree with Plan of Care  Patient       Patient will benefit from skilled therapeutic intervention in order to improve the following deficits and impairments:  Decreased activity tolerance, Decreased balance, Decreased cognition, Decreased endurance, Decreased knowledge of use of DME, Decreased mobility, Decreased strength, Impaired perceived functional ability, Postural dysfunction, Abnormal gait, Difficulty walking, Impaired tone, Decreased range of motion, Decreased coordination, Impaired UE functional use, Pain  Visit Diagnosis: Muscle weakness (generalized)  Other lack of coordination     Problem List Patient Active Problem List   Diagnosis Date Noted  . Postcoital UTI 05/24/2019  . Spastic hemiparesis ([facility]  . Elevated BUN   . Vascular headache   . Mood disorder in conditions classified elsewhere   . Right middle cerebral artery stroke ([facility] [place]01/07/2019  . Polysubstance abuse ([facility]  . Dyslipidemia   . Ischemic stroke diagnosed during current admission ([facility]01/04/2019  . MDD (major depressive disorder), recurrent episode, moderate  ([facility]CPocon[facility] [place]12/31/2020  . CVA (cerebral vascular accident) ([facility]CR[place]12/28/2020  . IV drug abuse ([facility]08/12/2017  . TBI (traumatic brain injury) ([facility]03/22/2018  . 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 Opioid abuse ([facility]Alfred  . Elevated troponin   . Elevated liver enzymes    JaLyndel Safeuprich PT, DPT, GCS  Cassiopeia Florentino 05/30/2019, 3:42 PM  [facility] [address] [place]one: [phone] Fax:  [phone]: LyAlma Friendly  Mcwethy MRN: [id number] Date of Birth: 07-13-1995

## 2019-05-30 NOTE — Therapy (Signed)
[place] MAIN [facility] SERVICES [address] [place], [place], [address] Phone: [phone]   Fax:  [phone]  Occupational Therapy Treatment/Recertification  Patient Details  Name: Jane Watkins MRN: [id number] Date of Birth: 1995-08-28 Referring Provider (OT): Dr. [name]   Encounter Date: 05/30/2019  OT End of Session - 05/30/19 1312    Visit Number  17    Number of Visits  24    Date for OT Re-Evaluation  08/22/19    OT Start Time  1304    OT Stop Time  1345    OT Time Calculation (min)  41 min    Activity Tolerance  Patient tolerated treatment well    Behavior During Therapy  Flat affect       Past Medical History:  Diagnosis Date  . AKI (acute kidney injury) ([place]) 2018   "from overdose"  . Anxiety   . Daily headache   . Depression   . Drug overdose 04/2016   [name] 05/01/2016  . GERD (gastroesophageal reflux disease)    "when I was younger; gone now" (09/21/2017)  . Migraine    "q couple weeks" (09/21/2017)  . Overdose   . Stroke [facility])     Past Surgical History:  Procedure Laterality Date  . APPENDECTOMY  04/2013  . I & D EXTREMITY Left 09/20/2017   Procedure: IRRIGATION AND DEBRIDEMENT LEFT ARM;  Surgeon: [name], MD;  Location: [place];  Service: Orthopedics;  Laterality: Left;  . TEE WITHOUT CARDIOVERSION N/A 02/10/2019   Procedure: TRANSESOPHAGEAL ECHOCARDIOGRAM (TEE);  Surgeon: [name], MD;  Location: [facility] ORS;  Service: Cardiovascular;  Laterality: N/A;  Polysubstance abuser  . TONGUE SURGERY  ~ 2007   "related to lisp"  . TRACHEOSTOMY  04/2016   [name] 05/01/2016    There were no vitals filed for this visit.  Subjective Assessment - 05/30/19 1311    Subjective  Pt. Reports that she is doing well today.   Pertinent History  Per chart. Pt. is a 24 y.o.female with a history of IV drug use, polysubstance as well as tobacco abuse, anxiety. Pt. presented to [facility] on 02/07/2019 after being found unresponsive.   EMS contacted patient received Narcan with some improvement in mental status.  Admission labs with WBC 20,700, potassium 5.2, BUN 25, creatinine 0.67, urine drug screen positive cocaine, marijuana and benzodiazepines, alcohol level less than 10, SARS coronavirus negative.  Cranial CT scan showed areas of indistinct low-density in the right basal ganglia with mass-effect on the right lateral ventricle.  Patient did not receive TPA.  MRI showed acute infarction of right MCA territory, affecting the basal ganglia and affecting the cortical and subcortical brain in a largely watershed distribution. Pt. had a previous inpatient rehab admission 05/01/2016 to 05/09/2016 for hypoxic encephalopathy after drug overdose requiring tracheostomy, and was later decannulated. Pt. resides with her boyfriend in an apartment. 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    Patient Stated Goals  Patient reports she would like to be as independent as possible with all her daily tasks.    Currently in Pain?  No/denies         [facility] OT Assessment - 05/30/19 1314      AROM   Overall AROM Comments  Left shoulder 152, abduction 108, 10(0)-145, wrist extension 54 degrees.      Strength   Overall Strength Comments  LE strength 3+/5      Hand Function   Left Hand  Grip (lbs)  20    Left Hand Lateral Pinch  7 lbs    Left 3 point pinch  8 lbs      Measurements were obtained, and goals were reviewed with the pt.  Pt. is making excellent progress with her LUE, and hand. Pt. is engaging her left hand more during ADLs, and IADL tasks. Pt. Has resumed driving independently.  Pt. Is cooking more meals at home. Pt. is now able to engage, and use her LUE to comb her hair, wash her face, reach for and hold a cup, and hold a toothbrush while brushing her teeth. Pt. Has improved with LUE ROM overall. Pt. Continues to work on progressing her LUE functioning in order to be able to hold a pens, and write, type, use  cooking utensils, and picking up small objects.                  OT Education - 05/30/19 1312    Education Details  thumb movements, grasping patterns    Person(s) Educated  Patient    Comprehension  Verbalized understanding;Returned demonstration;Need further instruction          OT Long Term Goals - 05/30/19 1323      OT LONG TERM GOAL #1   Title  Pt. will increase active isolated left shoulder flexion by 20 degrees in preparation for combing her hair.    Baseline  06/09/2019: Shoulder flexion 152. 04/25/2019: Shoulder flexion 132. Pt. is unable to use her left hand to brush her hair. Eval: No isolated shoulder shoulder flexion. Pt. compensates with synergystic movement with attempts for flexion.    Time  12    Period  Weeks    Status  Achieved      OT LONG TERM GOAL #2   Title  Pt. will increase active shoulder abduction by 20 degrees in preparation to be able to wash her hair.    Baseline  05/30/2019: Shoulder abduction: 108 Pt. is able to rinse her hair not wash it. 04/25/2019: shoulder abduction:95. Pt. is unable to use her LUE to wash her hair. Eval: Active shoulder abduction 56 degrees.    Time  12    Period  Weeks    Status  Partially Met    Target Date  08/29/19      OT LONG TERM GOAL #3   Title  Pt. will improve hand to face patterns using her left hand to be able to independently wash her face.    Baseline  05/30/2019: Pt. is able to wash her face with a wash rag using her left hand. 04/25/2019: Pt. is able to perfrom hand to face patterns with her left hand, however is not able to use for left hand to wash her face.Eval: Pt. is unable to wash her face using her left hand    Time  12    Period  Weeks    Status  Achieved      OT LONG TERM GOAL #4   Title  Pt. will increase AROM wrist extension by 10 degrees in preparation for reaching for a cup    Baseline  05/30/2019: 54 degrees. Pt. is able to reach for a cup. 04/25/2019: Wrist extension 54. Pt. conitnues to  work to reaching for a cup with her left hand. Eval: 0 degrees of active wrist extension.    Time  12    Period  Weeks    Status  Achieved      OT LONG  TERM GOAL #5   Title  Pt. will increase left digit flexion to be able to initiate actively holding a toothbrush.    Baseline  05/30/2019: Pt. is able to hold, and use a toothbrush. 04/25/2019: Pt. is indpendently able to hold, and stabilize a toorhbursh with her left hand while applying toothpaste. pt. is unable to use her left hand to brush her teeth. Eval: Pt. is unable to use her left hand to hold a toothbrush.    Time  12    Period  Weeks    Status  Achieved      Long Term Additional Goals   Additional Long Term Goals  --      OT LONG TERM GOAL #6   Title  Pt. will assume a mature tripod grasp on a pen while writing her name wiht 100% legibility.    Baseline  05/30/2019: Pt. is unable to assume a mature tripod grasp on a pen when drawing a line.    Time  12    Period  Weeks    Status  New    Target Date  08/29/19      OT LONG TERM GOAL #7   Title  Pt. will independently type a 3 sentence email message efficiently with 100% accuracy    Baseline  05/30/2019: Pt. is unable.    Time  12    Period  Weeks    Status  New    Target Date  08/29/19      OT LONG TERM GOAL #8   Title  Pt. will improve left hand [facility] skills in order to independentlymanipulate small objects needed during ADLs, and IADLs,    Baseline  06/09/2019: Left hand [facility] skills are limited.    Time  12    Period  Weeks    Status  New    Target Date  08/29/19      OT LONG TERM GOAL  #9   TITLE  Pt. will use her left hand to independently handle, and use cooking, and baking utensils.    Baseline  05/30/2019: Pt. has difficulty    Time  12   Period  Weeks    Status  New    Target Date  08/29/19            Plan - 05/30/19 1313    Clinical Impression Statement Pt. is making excellent progress with her LUE, and hand. Pt. is engaging her left hand more during ADLs,  and IADL tasks. Pt. Has resumed driving independently.  Pt. Is cooking more meals at home. Pt. is now able to engage, and use her LUE to comb her hair, wash her face, reach for and hold a cup, and hold a toothbrush while brushing her teeth. Pt. Has improved with LUE ROM overall. Pt. Continues to work on progressing her LUE functioning in order to be able to hold a pens, and write, type, use cooking utensils, and picking up small objects.      OT Occupational Profile and History  Detailed Assessment- Review of Records and additional review of physical, cognitive, psychosocial history related to current functional performance    Occupational performance deficits (Please refer to evaluation for details):  ADL's;IADL's    Body Structure / Function / Physical Skills  ADL;IADL;Coordination;Endurance;UE functional use;Decreased knowledge of precautions;Dexterity;FMC;ROM;Tone;Strength    Rehab Potential  Good    Clinical Decision Making  Several treatment options, min-mod task modification necessary    Comorbidities Affecting Occupational Performance:  May  have comorbidities impacting occupational performance    Modification or Assistance to Complete Evaluation   Min-Moderate modification of tasks or assist with assess necessary to complete eval    Consulted and Agree with Plan of Care  Patient       Patient will benefit from skilled therapeutic intervention in order to improve the following deficits and impairments:   Body Structure / Function / Physical Skills: ADL, IADL, Coordination, Endurance, UE functional use, Decreased knowledge of precautions, Dexterity, FMC, ROM, Tone, Strength       Visit Diagnosis: Muscle weakness (generalized)  Other lack of coordination    Problem List Patient Active Problem List   Diagnosis Date Noted  . Postcoital UTI 05/24/2019  . Spastic hemiparesis ([place])   . Elevated BUN   . Vascular headache   . Mood disorder in conditions classified elsewhere   . Right  middle cerebral artery stroke ([place]) 02/16/2019  . Polysubstance abuse ([place])   . Dyslipidemia   . Ischemic stroke diagnosed during current admission ([place]) 02/13/2019  . MDD (major depressive disorder), recurrent episode, moderate ([place]) 02/10/2019  . CVA (cerebral vascular accident) ([place]) 02/07/2019  . IV drug abuse ([place]) 09/20/2017  . TBI (traumatic brain injury) ([place]) 05/01/2016  . 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 Opioid abuse ([place])   . Elevated troponin   . Elevated liver enzymes     Harrel Carina, MS, OTR/L 05/30/2019, 3:18 PM  [place] MAIN [facility] SERVICES [address] [place], [place], [address] Phone: [phone]   Fax:  [phone]  Name: Alesa Echevarria MRN: [id number] Date of Birth: 12-08-1995

## 2019-05-31 ENCOUNTER — Ambulatory Visit (HOSPITAL_COMMUNITY): Payer: 59 | Admitting: Professional

## 2019-06-02 ENCOUNTER — Ambulatory Visit: Payer: 59 | Admitting: Occupational Therapy

## 2019-06-02 ENCOUNTER — Ambulatory Visit: Payer: 59 | Admitting: Physical Therapy

## 2019-06-05 ENCOUNTER — Other Ambulatory Visit: Payer: Self-pay | Admitting: Physical Medicine and Rehabilitation

## 2019-06-05 MED ORDER — ZOLPIDEM TARTRATE ER 12.5 MG PO TBCR: 12.5000 mg | EXTENDED_RELEASE_TABLET | Freq: Every evening | ORAL | 3 refills | Status: DC | PRN

## 2019-06-06 ENCOUNTER — Encounter: Payer: Self-pay | Admitting: Occupational Therapy

## 2019-06-06 ENCOUNTER — Ambulatory Visit: Payer: 59 | Admitting: Occupational Therapy

## 2019-06-06 ENCOUNTER — Other Ambulatory Visit: Payer: Self-pay

## 2019-06-06 ENCOUNTER — Ambulatory Visit: Payer: 59 | Admitting: Physical Therapy

## 2019-06-06 DIAGNOSIS — R2689 Other abnormalities of gait and mobility: Secondary | ICD-10-CM

## 2019-06-06 DIAGNOSIS — M6281 Muscle weakness (generalized): Secondary | ICD-10-CM | POA: Diagnosis not present

## 2019-06-06 DIAGNOSIS — I63511 Cerebral infarction due to unspecified occlusion or stenosis of right middle cerebral artery: Secondary | ICD-10-CM

## 2019-06-06 DIAGNOSIS — R41844 Frontal lobe and executive function deficit: Secondary | ICD-10-CM

## 2019-06-06 DIAGNOSIS — M25512 Pain in left shoulder: Secondary | ICD-10-CM

## 2019-06-06 DIAGNOSIS — R278 Other lack of coordination: Secondary | ICD-10-CM

## 2019-06-06 DIAGNOSIS — I639 Cerebral infarction, unspecified: Secondary | ICD-10-CM

## 2019-06-06 NOTE — Therapy (Signed)
[place] MAIN [facility] SERVICES [address] [place], [place], [address] Phone: [phone]   Fax:  [phone]  Physical Therapy Treatment  Patient Details  Name: Jane Watkins MRN: [id number] Date of Birth: [birth year] Referring Provider (PT): Dr. Earnest Conroy. [name]   Encounter Date: 06/06/2019  PT End of Session - 06/06/19 1326    Visit Number  18    Number of Visits  24    PT Start Time  6270    PT Stop Time  1430    PT Time Calculation (min)  45 min    Equipment Utilized During Treatment  Gait belt    Activity Tolerance  Patient tolerated treatment well;No increased pain    Behavior During Therapy  Flat affect       Past Medical History:  Diagnosis Date  . AKI (acute kidney injury) ([place]) 2018   "from overdose"  . Anxiety   . Daily headache   . Depression   . Drug overdose 04/2016   [name] 05/01/2016  . GERD (gastroesophageal reflux disease)    "when I was younger; gone now" (09/21/2017)  . Migraine    "q couple weeks" (09/21/2017)  . Overdose   . Stroke [facility])     Past Surgical History:  Procedure Laterality Date  . APPENDECTOMY  04/2013  . I & D EXTREMITY Left 09/20/2017   Procedure: IRRIGATION AND DEBRIDEMENT LEFT ARM;  Surgeon: [name], MD;  Location: [place];  Service: Orthopedics;  Laterality: Left;  . TEE WITHOUT CARDIOVERSION N/A 02/10/2019   Procedure: TRANSESOPHAGEAL ECHOCARDIOGRAM (TEE);  Surgeon: [name], MD;  Location: [facility];  Service: Cardiovascular;  Laterality: N/A;  Polysubstance abuser  . TONGUE SURGERY  ~ 2007   "related to lisp"  . TRACHEOSTOMY  04/2016   [name] 05/01/2016    There were no vitals filed for this visit.  Subjective Assessment - 06/06/19 1455    Subjective  Pt reports that she is doing well. She put on her own shoes and now is organizing her own medication box for the week. . Denies any pain upon arrival today. No specific questions or concerns.    Patient is accompained by:  --    significant other   Pertinent History  Pt is 24 y.o. female presenting to hospital 02/07/19 initially,  MRI showing acute infarct R MCA territory affecting basal ganglia and affecting cortical and subcortical brain in a largely watershed distribution. After in hospital stay patient transitioned to CIR from 02/16/2019-03/01/2019.  PMH includes IV drug abuse, anxiety, depression, migraines, AKI, hypoxic ischemic encephalopathy 2018, opiate overdose 2018, h/o trach 2018, h/o I&D L UE. PLOF: lives with boyfriend in one story apartment, prior to CVA patient was independent, working.    Limitations  Walking;Reading;Lifting;Writing;House hold activities;Standing    How long can you sit comfortably?  NA    How long can you stand comfortably?  20 mins    How long can you walk comfortably?  20 mins    Diagnostic tests  see MRI results    Patient Stated Goals  Patient wants to maximize how much she can walk, get back to normal    Pain Onset  Today       Treatment: TM .5 miles/ hour x backwards walking x 10 mins Side stepping on TM . 4 miles / hour x 5 mins each side Leg press 70 lbs x 20 x 3  Nu-step LE only x 5 mins L 4  Patient performed with instruction,  verbal cues, tactile cues of therapist: goal: increase tissue extensibility, promote proper posture, improve mobility                        PT Education - 06/06/19 1326    Education provided  Yes    Education Details  HEP    Person(s) Educated  Patient    Methods  Explanation    Comprehension  Verbalized understanding;Need further instruction       PT Short Term Goals - 04/25/19 1627      PT SHORT TERM GOAL #1   Title  Pt will be independent with initial HEP in order to indicate improved strength and decreased fall risk.    Baseline  initial HEP administered on eval 03/02/2019; 04/25/19: "not as much as should."    Time  6    Period  Weeks    Status  Partially Met    Target Date  04/13/19        PT Long Term Goals  - 04/25/19 1628      PT LONG TERM GOAL #1   Title  Pt will be independent with final HEP in order to indicate improved strength and decreased fall risk.    Baseline  initial HEP administered on eval 03/02/19; 04/25/19: "not as much as a should."    Time  12    Period  Weeks    Status  On-going    Target Date  05/25/19      PT LONG TERM GOAL #2   Title  Pt will decrease 5TSTS to less than 10 seconds in order to demonstrate clinically significant improvement in LE strength and functional mobility.    Baseline  03/02/2019: 17 seconds without UE support, dependent on RLE, very little weight shift to LLE; 04/25/19: 7.7s, no UE, decreased weight shifting to LLE;    Time  12    Period  Weeks    Status  Achieved      PT LONG TERM GOAL #3   Title  Pt will perform 6MWT > 1045f with LRAD at mod I level in order to indicate safe community/leisure negotiation.    Baseline  03/02/2019 5349f 04/25/19: 1000'    Time  12    Period  Weeks    Status  Partially Met    Target Date  05/25/19      PT LONG TERM GOAL #4   Title  The patient will demonstrate at least 1/2 grade improvement in LE MMT grade to improve ability to perform functional activities.    Baseline  see eval for details 03/02/2019; 04/25/19: see visit note, significant improvement, still lacking L anikle dorsiflexion, eversion, or inversion    Time  12    Period  Weeks    Status  Partially Met    Target Date  05/25/19      PT LONG TERM GOAL #5   Title  Pt will ambulate >1 m/s with LRAD mod I to indicate gait velocity of community ambulator.    Baseline  03/02/19: self selected: .57 m/s, fastest .7214mwith quad cane; 04/25/19: Self-selected: 10.8s = 0.93 m/s; Fastest: 8.8s = 1.14 m/s, performed with no AD    Time  12    Period  Weeks    Status  Partially Met    Target Date  05/25/19            Plan - 06/06/19 1327    Clinical Impression Statement  Patient instructed  in intermediate exercise challenges. Patient required min-mod VCs  for correct positioning; Patient had increased difficulty with dynamic steps on TM and dule task movements especially unsupported. Patient would benefit from additional skilled PT intervention to improve strength, balance.   Personal Factors and Comorbidities  Behavior Pattern    Examination-Activity Limitations  Bathing;Dressing;Sit;Transfers;Sleep;Bed Mobility;Bend;Caring for Others;Carry;Toileting;Reach Overhead;Stand;Locomotion Level;Stairs;Squat;Lift;Hygiene/Grooming    Examination-Participation Restrictions  Church;Interpersonal Relationship;Personal Finances;Yard Work;Cleaning;Laundry;School;Community Activity;Medication Management;Driving;Meal Prep;Shop    Rehab Potential  Good    Clinical Impairments Affecting Rehab Potential  unsure of severity of cognitive/psych deficits    PT Frequency  2x / week    PT Duration  12 weeks    PT Treatment/Interventions  ADLs/Self Care Home Management;DME Instruction;Gait training;Stair training;Functional mobility training;Therapeutic activities;Therapeutic exercise;Balance training;Neuromuscular re-education;Cognitive remediation;Patient/family education;Vestibular;Energy conservation;Cryotherapy;Ultrasound;Electrical Stimulation;Fluidtherapy;Iontophoresis 67m/ml Dexamethasone;Canalith Repostioning;Moist Heat;Traction;Passive range of motion;Manual techniques;Joint Manipulations;Spinal Manipulations;Splinting;Taping;Scar mobilization;Wheelchair mobility training;Prosthetic Training;Orthotic Fit/Training;Dry needling;Visual/perceptual remediation/compensation;Aquatic Therapy    PT Next Visit Plan  continue with LE strengthening, NME, and motor control activities    PT Home Exercise Plan  Access Code: [email or web address]    Consulted and Agree with Plan of Care  Patient       Patient will benefit from skilled therapeutic intervention in order to improve the following deficits and impairments:  Decreased activity tolerance, Decreased balance, Decreased cognition,  Decreased endurance, Decreased knowledge of use of DME, Decreased mobility, Decreased strength, Impaired perceived functional ability, Postural dysfunction, Abnormal gait, Difficulty walking, Impaired tone, Decreased range of motion, Decreased coordination, Impaired UE functional use, Pain  Visit Diagnosis: Muscle weakness (generalized)  Other lack of coordination  Right middle cerebral artery stroke (HCC)  [facility]  Acute pain of left shoulder  Other abnormalities of gait and mobility  Frontal lobe and executive function deficit     Problem List Patient Active Problem List   Diagnosis Date Noted  . Postcoital UTI 05/24/2019  . Spastic hemiparesis ([facility]   . Elevated BUN   . Vascular headache   . Mood disorder in conditions classified elsewhere   . Right middle cerebral artery stroke ([place] 02/16/2019  . Polysubstance abuse ([place]   . Dyslipidemia   . Ischemic stroke diagnosed during current admission ([facility] 02/13/2019  . MDD (major depressive disorder), recurrent episode, moderate ([facility] 02/10/2019  . CVA (cerebral vascular accident) ([place] 02/07/2019  . IV drug abuse ([place] 09/20/2017  . TBI (traumatic brain injury) ([place] 05/01/2016  . 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 Opioid abuse ([place]   . Elevated troponin   . Elevated liver enzymes     MSherre PootKSherryl Barters, PT DPT [facility]MAIN [facility] LL[facility]ERVICES [address] [address]Phone: [phone]  Fax:  [phone] Name: LKida DigiulioMRN: [id number] of Birth: [birth year]

## 2019-06-06 NOTE — Therapy (Signed)
[place] MAIN [facility] SERVICES [address] [place], [place], [address] Phone: [phone]   Fax:  [phone]  Occupational Therapy Treatment  Patient Details  Name: Jane Watkins MRN: [id number] Date of Birth: 04-Aug-1995 Referring Provider (OT): Dr. [name]   Encounter Date: 06/06/2019  OT End of Session - 06/06/19 1339    Visit Number  18    Number of Visits  24    Date for OT Re-Evaluation  08/29/19    Authorization Type  Progress report period starting 03/07/2019    OT Start Time  1305    OT Stop Time  1345    OT Time Calculation (min)  40 min    Activity Tolerance  Patient tolerated treatment well    Behavior During Therapy  Flat affect       Past Medical History:  Diagnosis Date  . AKI (acute kidney injury) ([place]) 2018   "from overdose"  . Anxiety   . Daily headache   . Depression   . Drug overdose 04/2016   [name] 05/01/2016  . GERD (gastroesophageal reflux disease)    "when I was younger; gone now" (09/21/2017)  . Migraine    "q couple weeks" (09/21/2017)  . Overdose   . Stroke [facility])     Past Surgical History:  Procedure Laterality Date  . APPENDECTOMY  04/2013  . I & D EXTREMITY Left 09/20/2017   Procedure: IRRIGATION AND DEBRIDEMENT LEFT ARM;  Surgeon: [name], MD;  Location: [place];  Service: Orthopedics;  Laterality: Left;  . TEE WITHOUT CARDIOVERSION N/A 02/10/2019   Procedure: TRANSESOPHAGEAL ECHOCARDIOGRAM (TEE);  Surgeon: [name], MD;  Location: [facility];  Service: Cardiovascular;  Laterality: N/A;  Polysubstance abuser  . TONGUE SURGERY  ~ 2007   "related to lisp"  . TRACHEOSTOMY  04/2016   [name] 05/01/2016    There were no vitals filed for this visit.  Subjective Assessment - 06/06/19 1337    Subjective   Pt. reports thta he hand is tighter.    Patient is accompanied by:  Family member    Pertinent History  Per chart. Pt. is a 24 y.o.female with a history of IV drug use, polysubstance as well  as tobacco abuse, anxiety. Pt. presented to [facility] on 02/07/2019 after being found unresponsive.  EMS contacted patient received Narcan with some improvement in mental status.  Admission labs with WBC 20,700, potassium 5.2, BUN 25, creatinine 0.67, urine drug screen positive cocaine, marijuana and benzodiazepines, alcohol level less than 10, SARS coronavirus negative.  Cranial CT scan showed areas of indistinct low-density in the right basal ganglia with mass-effect on the right lateral ventricle.  Patient did not receive TPA.  MRI showed acute infarction of right MCA territory, affecting the basal ganglia and affecting the cortical and subcortical brain in a largely watershed distribution. Pt. had a previous inpatient rehab admission 05/01/2016 to 05/09/2016 for hypoxic encephalopathy after drug overdose requiring tracheostomy, and was later decannulated. Pt. resides with her boyfriend in an apartment. 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    Patient Stated Goals  Patient reports she would like to be as independent as possible with all her daily tasks.    Currently in Pain?  No/denies      OT TREATMENT   There. Ex:  Pt. Tolerated UE there. Ex for shoulder flexion, abduction, elbow flexion, extension, forearm supination, pronation, wrist extension, digit MCP fleion, extension, and thumb abduction.  Manual Therapy:  Pt. Tolerated scapular elevation, depression, abduction, rotation while seated. Pt. tolerated soft tissue mobilizations for metacarpal spread stretches  To normalize tone, and prepare the UE for ROM, therapeutic ex., and engagement in functional tasks. Manual Techniques were performed independent of Therapeutic Ex.  Neuro muscular re-education:  Pt. Worked on grasping 1/2" flat marbles. With her left hand. Pt. Worked on reaching up to place them in a container placed at an elevated surface. Pt. Worked on extending her digits. After placing them into the  container.  Pt.alternated weightbearing with a flat hand placed at the tabletop surface. Pt. worked on [facility] skills grasping 1" sticks, 1/4" collars, and 1/4" washers.   Pt. reports that she was sick with allergies last week. Pt. Presented with increased flexor tone, and tightness in the LUE limiting scapular elevation, depression, abduction, forearm supination, 3rd, and 4th digit extension, and thumb abduction. Pt.Continues to respondwell to inhibitory techniques, weightbearing with a flat hand at the tabletop surface.Pt. continues to work on improving LUE functioning in order toimprove functional reaching, and normalizing tone to improve grasping, and prehensionpatterns, and increaseengagement during ADLs, and IADL tasks.                       OT Education - 06/06/19 1339    Education Details  thumb movements, grasping patterns    Person(s) Educated  Patient    Methods  Explanation;Demonstration;Tactile cues;Verbal cues;Handout    Comprehension  Verbalized understanding;Returned demonstration;Need further instruction          OT Long Term Goals - 05/30/19 1323      OT LONG TERM GOAL #1   Title  Pt. will increase active isolated left shoulder flexion by 20 degrees in preparation for combing her hair.    Baseline  06/09/2019: Shoulder flexion 152. 04/25/2019: Shoulder flexion 132. Pt. is unable to use her left hand to brush her hair. Eval: No isolated shoulder shoulder flexion. Pt. compensates with synergystic movement with attempts for flexion.    Time  12    Period  Weeks    Status  Achieved      OT LONG TERM GOAL #2   Title  Pt. will increase active shoulder abduction by 20 degrees in preparation to be able to wash her hair.    Baseline  05/30/2019: Shoulder abduction: 108 Pt. is able to rinse her hair not wash it. 04/25/2019: shoulder abduction:95. Pt. is unable to use her LUE to wash her hair. Eval: Active shoulder abduction 56 degrees.    Time  12    Period   Weeks    Status  Partially Met    Target Date  08/29/19      OT LONG TERM GOAL #3   Title  Pt. will improve hand to face patterns using her left hand to be able to independently wash her face.    Baseline  05/30/2019: Pt. is able to wash her face with a wash rag using her left hand. 04/25/2019: Pt. is able to perfrom hand to face patterns with her left hand, however is not able to use for left hand to wash her face.Eval: Pt. is unable to wash her face using her left hand    Time  12    Period  Weeks    Status  Achieved      OT LONG TERM GOAL #4   Title  Pt. will increase AROM wrist extension by 10 degrees in preparation for reaching for a cup  Baseline  05/30/2019: 54 degrees. Pt. is able to reach for a cup. 04/25/2019: Wrist extension 54. Pt. conitnues to work to reaching for a cup with her left hand. Eval: 0 degrees of active wrist extension.    Time  12    Period  Weeks    Status  Achieved      OT LONG TERM GOAL #5   Title  Pt. will increase left digit flexion to be able to initiate actively holding a toothbrush.    Baseline  05/30/2019: Pt. is able to hold, and use a toothbrush. 04/25/2019: Pt. is indpendently able to hold, and stabilize a toorhbursh with her left hand while applying toothpaste. pt. is unable to use her left hand to brush her teeth. Eval: Pt. is unable to use her left hand to hold a toothbrush.    Time  12    Period  Weeks    Status  Achieved      Long Term Additional Goals   Additional Long Term Goals  --      OT LONG TERM GOAL #6   Title  Pt. will assume a mature tripod grasp on a pen while writing her name wiht 100% legibility.    Baseline  05/30/2019: Pt. is unable to assume a mature tripod grasp on a pen when drawing a line.    Time  12    Period  Weeks    Status  New    Target Date  08/29/19      OT LONG TERM GOAL #7   Title  Pt. will independently type a 3 sentence email message efficiently with 100% accuracy    Baseline  05/30/2019: Pt. is unable.    Time   12    Period  Weeks    Status  New    Target Date  08/29/19      OT LONG TERM GOAL #8   Title  Pt. will improve left hand [facility] skills in order to independentlymanipulate small objects needed during ADLs, and IADLs,    Baseline  06/09/2019: Left hand [facility] skills are limited.    Time  12    Period  Weeks    Status  New    Target Date  08/29/19      OT LONG TERM GOAL  #9   TITLE  Pt. will use her left hand to independently handle, and use cooking, and baking utensils.    Baseline  05/30/2019: Pt. has difficulty    Time  0    Period  Weeks    Status  New    Target Date  08/29/19            Plan - 06/06/19 1339    Clinical Impression Statement  Pt. reports that she was sick with allergies last week. Pt. Presented with increased flexor tone, and tightness in the LUE limiting scapular elevation, depression, abduction, forearm supination, 3rd, and 4th digit extension, and thumb abduction. Pt.Continues to respondwell to inhibitory techniques, weightbearing with a flat hand at the tabletop surface.Pt. continues to work on improving LUE functioning in order toimprove functional reaching, and normalizing tone to improve grasping, and prehensionpatterns, and increaseengagement during ADLs, and IADL tasks.     OT Occupational Profile and History  Detailed Assessment- Review of Records and additional review of physical, cognitive, psychosocial history related to current functional performance    Occupational performance deficits (Please refer to evaluation for details):  ADL's;IADL's    Body Structure / Function /  Physical Skills  ADL;IADL;Coordination;Endurance;UE functional use;Decreased knowledge of precautions;Dexterity;FMC;ROM;Tone;Strength    Rehab Potential  Good    Clinical Decision Making  Several treatment options, min-mod task modification necessary    Comorbidities Affecting Occupational Performance:  May have comorbidities impacting occupational performance    Modification or  Assistance to Complete Evaluation   Min-Moderate modification of tasks or assist with assess necessary to complete eval    OT Frequency  2x / week    OT Duration  12 weeks    OT Treatment/Interventions  Self-care/ADL training;DME and/or AE instruction;Energy conservation;Therapeutic activities;Patient/family education;Therapeutic exercise;Neuromuscular education    Consulted and Agree with Plan of Care  Patient       Patient will benefit from skilled therapeutic intervention in order to improve the following deficits and impairments:   Body Structure / Function / Physical Skills: ADL, IADL, Coordination, Endurance, UE functional use, Decreased knowledge of precautions, Dexterity, FMC, ROM, Tone, Strength       Visit Diagnosis: Muscle weakness (generalized)  Other lack of coordination    Problem List Patient Active Problem List   Diagnosis Date Noted  . Postcoital UTI 05/24/2019  . Spastic hemiparesis ([place])   . Elevated BUN   . Vascular headache   . Mood disorder in conditions classified elsewhere   . Right middle cerebral artery stroke ([place]) 02/16/2019  . Polysubstance abuse ([place])   . Dyslipidemia   . Ischemic stroke diagnosed during current admission ([place]) 02/13/2019  . MDD (major depressive disorder), recurrent episode, moderate ([place]) 02/10/2019  . CVA (cerebral vascular accident) ([place]) 02/07/2019  . IV drug abuse ([place]) 09/20/2017  . TBI (traumatic brain injury) ([place]) 05/01/2016  . 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 Opioid abuse ([place])   . Elevated troponin   . Elevated liver enzymes     Harrel Carina, MS, OTR/L 06/06/2019, 1:44 PM  [place] MAIN [facility] SERVICES [address] [place], [place], [address] Phone: [phone]   Fax:  [phone]  Name: Jane Watkins MRN: [id number] Date of Birth: 24-May-1995

## 2019-06-07 ENCOUNTER — Other Ambulatory Visit: Payer: Self-pay

## 2019-06-07 ENCOUNTER — Ambulatory Visit (INDEPENDENT_AMBULATORY_CARE_PROVIDER_SITE_OTHER): Payer: 59 | Admitting: Family Medicine

## 2019-06-07 ENCOUNTER — Encounter: Payer: Self-pay | Admitting: Family Medicine

## 2019-06-07 VITALS — BP 98/56 | HR 74 | Temp 98.5°F | Ht 63.75 in | Wt 119.5 lb

## 2019-06-07 DIAGNOSIS — F331 Major depressive disorder, recurrent, moderate: Secondary | ICD-10-CM

## 2019-06-07 DIAGNOSIS — N390 Urinary tract infection, site not specified: Secondary | ICD-10-CM | POA: Diagnosis not present

## 2019-06-07 DIAGNOSIS — Z3009 Encounter for other general counseling and advice on contraception: Secondary | ICD-10-CM

## 2019-06-07 MED ORDER — NITROFURANTOIN MONOHYD MACRO 100 MG PO CAPS: 100.0000 mg | ORAL_CAPSULE | Freq: Every day | ORAL | 1 refills | Status: DC | PRN

## 2019-06-07 NOTE — Patient Instructions (Signed)
Nitrofurantoin - OK to start after the urine culture is back - Take within 2 hours of having sex  Therapy - let me know after your visit if we need to follow-up on any referrals

## 2019-06-07 NOTE — Assessment & Plan Note (Signed)
Symptoms resolved. She will start prophylaxis with nitrofurantoin w/in 2 hours of intercourse. Prescribed. Refills prn.

## 2019-06-07 NOTE — Assessment & Plan Note (Signed)
She continues her medications. Unclear if she is seeing a therapist or psychiatrist at her upcoming appointment. She will let me know after the visit if she needs additional help getting the other referral or if it can all be accomplished by the same office. Appreciate specialty support.

## 2019-06-07 NOTE — Progress Notes (Signed)
   Subjective:     Jane Watkins is a 24 y.o. female presenting for Follow-up (2 weeks) and Contraception (discuss IUD)     HPI   #postcoital uti - symptoms resolved - still interested in prevention  #depression - is still taking medication - has an appointment on 5/3 but unclear if this is a therapy appointment or psychiatry.   #birth control - would like a copper IUD - referral to gyn  Review of Systems   Social History   Tobacco Use  Smoking Status Current Every Day Smoker  . Packs/day: 0.50  . Years: 10.00  . Pack years: 5.00  . Types: Cigarettes  Smokeless Tobacco Never Used        Objective:    BP Readings from Last 3 Encounters:  06/07/19 (!) 98/56  05/24/19 116/65  05/24/19 98/60   Wt Readings from Last 3 Encounters:  06/07/19 119 lb 8 oz (54.2 kg)  05/24/19 121 lb (54.9 kg)  05/24/19 121 lb 4 oz (55 kg)    BP (!) 98/56   Pulse 74   Temp 98.5 F (36.9 C)   Ht 5' 3.75" (1.619 m)   Wt 119 lb 8 oz (54.2 kg)   SpO2 99%   BMI 20.67 kg/m    Physical Exam        Assessment & Plan:   Problem List Items Addressed This Visit      Genitourinary   Postcoital UTI - Primary    Symptoms resolved. She will start prophylaxis with nitrofurantoin w/in 2 hours of intercourse. Prescribed. Refills prn.        Relevant Medications   nitrofurantoin, macrocrystal-monohydrate, (MACROBID) 100 MG capsule   Other Relevant Orders   Urine Culture     Other   MDD (major depressive disorder), recurrent episode, moderate (HCC)    She continues her medications. Unclear if she is seeing a therapist or psychiatrist at her upcoming appointment. She will let me know after the visit if she needs additional help getting the other referral or if it can all be accomplished by the same office. Appreciate specialty support.        Other Visit Diagnoses    Birth control counseling       Relevant Orders   Ambulatory referral to Obstetrics / Gynecology     During  our last visit we had a lengthy discussion regarding birth control options. I unfortunately do not do copper IUD insertions, so will refer to GYN. She knows the common side effects but given her mental health history and stroke/neurological history she would like to avoid hormones. Also due for PAP which I am optimistic they will be able to perform with her insertion.    Return in about 1 year (around 06/06/2020).  [name], MD

## 2019-06-08 ENCOUNTER — Telehealth: Payer: Self-pay | Admitting: Obstetrics & Gynecology

## 2019-06-08 ENCOUNTER — Telehealth: Payer: Self-pay | Admitting: Obstetrics and Gynecology

## 2019-06-08 LAB — URINE CULTURE
MICRO NUMBER:: 10410827
SPECIMEN QUALITY:: ADEQUATE

## 2019-06-08 NOTE — Telephone Encounter (Signed)
[facility] referring for desires Copper IUD. Called and left voicemail for patient to call back to be scheduled.

## 2019-06-08 NOTE — Telephone Encounter (Signed)
Patient is schedule for Wedenesday, 06/15/19 for possible Paraguard placement. Patient states today 06/08/19 she is currently on 4-5 day of menstrual cycle and would like the paraguard placed at the appointment if possible. Would you want to send in cytotec prior to appointment. Please advise

## 2019-06-08 NOTE — Telephone Encounter (Signed)
Prefer to place with menses. Can cx 06/15/19 appt and pt can call with next menses for IUD conf/placement appt  at same time. Also should have cytotec ahead of time. If wants to come 06/15/19 anyway, will then have pt RTO for placement with menses.

## 2019-06-09 ENCOUNTER — Ambulatory Visit: Payer: 59 | Admitting: Physical Therapy

## 2019-06-09 ENCOUNTER — Encounter: Payer: 59 | Admitting: Occupational Therapy

## 2019-06-09 ENCOUNTER — Ambulatory Visit: Payer: 59 | Admitting: Occupational Therapy

## 2019-06-09 ENCOUNTER — Telehealth: Payer: Self-pay

## 2019-06-09 ENCOUNTER — Other Ambulatory Visit: Payer: Self-pay

## 2019-06-09 ENCOUNTER — Encounter: Payer: Self-pay | Admitting: Physical Therapy

## 2019-06-09 ENCOUNTER — Encounter: Payer: Self-pay | Admitting: Occupational Therapy

## 2019-06-09 DIAGNOSIS — M25512 Pain in left shoulder: Secondary | ICD-10-CM

## 2019-06-09 DIAGNOSIS — I63511 Cerebral infarction due to unspecified occlusion or stenosis of right middle cerebral artery: Secondary | ICD-10-CM

## 2019-06-09 DIAGNOSIS — I639 Cerebral infarction, unspecified: Secondary | ICD-10-CM

## 2019-06-09 DIAGNOSIS — R278 Other lack of coordination: Secondary | ICD-10-CM

## 2019-06-09 DIAGNOSIS — M6281 Muscle weakness (generalized): Secondary | ICD-10-CM

## 2019-06-09 DIAGNOSIS — R2689 Other abnormalities of gait and mobility: Secondary | ICD-10-CM

## 2019-06-09 DIAGNOSIS — R41844 Frontal lobe and executive function deficit: Secondary | ICD-10-CM

## 2019-06-09 MED ORDER — QUETIAPINE FUMARATE 50 MG PO TABS: 50.0000 mg | ORAL_TABLET | Freq: Every day | ORAL | 3 refills | Status: DC

## 2019-06-09 NOTE — Telephone Encounter (Signed)
[name] called staying he flushed patients Ambien down the toilet because it was making patient disoriented and getting up without using brace and falling. Please advise.

## 2019-06-09 NOTE — Therapy (Signed)
[place] [facility] SERVICES [address] [place], [place], [address] Phone: [phone]   Fax:  [phone]  Occupational Therapy Treatment  Patient Details  Name: Jane Watkins MRN: [id number] Date of Birth: December 18, 1995 Referring Provider (OT): Dr. [name]   Encounter Date: 06/09/2019  OT End of Session - 06/09/19 1155    Visit Number  19    Number of Visits  24    Date for OT Re-Evaluation  08/29/19    Authorization Type  Progress report period starting 03/07/2019    OT Start Time  1145    OT Stop Time  1230    OT Time Calculation (min)  45 min    Activity Tolerance  Patient tolerated treatment well    Behavior During Therapy  Flat affect       Past Medical History:  Diagnosis Date  . AKI (acute kidney injury) ([place]) 2018   "from overdose"  . Anxiety   . Daily headache   . Depression   . Drug overdose 04/2016   [name] 05/01/2016  . GERD (gastroesophageal reflux disease)    "when I was younger; gone now" (09/21/2017)  . IV drug abuse ([place]) 09/20/2017  . Migraine    "q couple weeks" (09/21/2017)  . Opioid abuse ([place])   . Overdose   . Stroke [facility])     Past Surgical History:  Procedure Laterality Date  . APPENDECTOMY  04/2013  . I & D EXTREMITY Left 09/20/2017   Procedure: IRRIGATION AND DEBRIDEMENT LEFT ARM;  Surgeon: [name], MD;  Location: [facility];  Service: Orthopedics;  Laterality: Left;  . TEE WITHOUT CARDIOVERSION N/A 02/10/2019   Procedure: TRANSESOPHAGEAL ECHOCARDIOGRAM (TEE);  Surgeon: [name], MD;  Location: [facility];  Service: Cardiovascular;  Laterality: N/A;  Polysubstance abuser  . TONGUE SURGERY  ~ 2007   "related to lisp"  . TRACHEOSTOMY  04/2016   [name] 05/01/2016    There were no vitals filed for this visit.  Subjective Assessment - 06/09/19 1152    Patient is accompanied by:  Family member    Pertinent History  Per chart. Pt. is a 24 y.o.female with a history of IV drug use, polysubstance as  well as tobacco abuse, anxiety. Pt. presented to [facility] on 02/07/2019 after being found unresponsive.  EMS contacted patient received Narcan with some improvement in mental status.  Admission labs with WBC 20,700, potassium 5.2, BUN 25, creatinine 0.67, urine drug screen positive cocaine, marijuana and benzodiazepines, alcohol level less than 10, SARS coronavirus negative.  Cranial CT scan showed areas of indistinct low-density in the right basal ganglia with mass-effect on the right lateral ventricle.  Patient did not receive TPA.  MRI showed acute infarction of right MCA territory, affecting the basal ganglia and affecting the cortical and subcortical brain in a largely watershed distribution. Pt. had a previous inpatient rehab admission 05/01/2016 to 05/09/2016 for hypoxic encephalopathy after drug overdose requiring tracheostomy, and was later decannulated. Pt. resides with her boyfriend in an apartment. 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    Patient Stated Goals  Patient reports she would like to be as independent as possible with all her daily tasks.      OT TREATMENT    Neuro muscular re-education:  Pt. worked on using her left hand for grasping, and manipulating 1", 3/4",1/2" washers from a magnetic dish using point grasp pattern. Pt. worked on reaching up, stabilizing, and sustaining shoulder elevation while placing  the washer over magnetic hooks placed at various heights. Pt. worked on alternating weightbearing through a flat hand at the tabletop. When removing them, the pt. changed her position from sitting to standing. Pt. worked on [facility] skills grasping 1" sticks and placing them onto the [employer] . Pt. Was able to grasp the 1" sticks with increased time, however was able to   Pt. Is making progress overall. Pt. has an appointment on May 4th with a psychiatrist. Pt. plans to have counseling services, however has been having difficulty with scheduling secondary  to decreased availability appointments.  Pt. required a change of body position form sitting to standing when removing the washers from the hooks while sustaining her LUE in elevation. Pt. was able to grasp the 1" sticks from the dish with her left hand with increased time, however was able to required the use of the right hand to assist to turning the sticks from horizontal, to a vertical direction. Pt. Presented with less tone, and tightness form the previous treatment session. Pt. Reports that she has resumed wearing her hand splint at night, and is doing her stretches.                           OT Long Term Goals - 05/30/19 1323      OT LONG TERM GOAL #1   Title  Pt. will increase active isolated left shoulder flexion by 20 degrees in preparation for combing her hair.    Baseline  06/09/2019: Shoulder flexion 152. 04/25/2019: Shoulder flexion 132. Pt. is unable to use her left hand to brush her hair. Eval: No isolated shoulder shoulder flexion. Pt. compensates with synergystic movement with attempts for flexion.    Time  12    Period  Weeks    Status  Achieved      OT LONG TERM GOAL #2   Title  Pt. will increase active shoulder abduction by 20 degrees in preparation to be able to wash her hair.    Baseline  05/30/2019: Shoulder abduction: 108 Pt. is able to rinse her hair not wash it. 04/25/2019: shoulder abduction:95. Pt. is unable to use her LUE to wash her hair. Eval: Active shoulder abduction 56 degrees.    Time  12    Period  Weeks    Status  Partially Met    Target Date  08/29/19      OT LONG TERM GOAL #3   Title  Pt. will improve hand to face patterns using her left hand to be able to independently wash her face.    Baseline  05/30/2019: Pt. is able to wash her face with a wash rag using her left hand. 04/25/2019: Pt. is able to perfrom hand to face patterns with her left hand, however is not able to use for left hand to wash her face.Eval: Pt. is unable to wash her  face using her left hand    Time  12    Period  Weeks    Status  Achieved      OT LONG TERM GOAL #4   Title  Pt. will increase AROM wrist extension by 10 degrees in preparation for reaching for a cup    Baseline  05/30/2019: 54 degrees. Pt. is able to reach for a cup. 04/25/2019: Wrist extension 54. Pt. conitnues to work to reaching for a cup with her left hand. Eval: 0 degrees of active wrist extension.    Time  12  Period  Weeks    Status  Achieved      OT LONG TERM GOAL #5   Title  Pt. will increase left digit flexion to be able to initiate actively holding a toothbrush.    Baseline  05/30/2019: Pt. is able to hold, and use a toothbrush. 04/25/2019: Pt. is indpendently able to hold, and stabilize a toorhbursh with her left hand while applying toothpaste. pt. is unable to use her left hand to brush her teeth. Eval: Pt. is unable to use her left hand to hold a toothbrush.    Time  12    Period  Weeks    Status  Achieved      Long Term Additional Goals   Additional Long Term Goals  --      OT LONG TERM GOAL #6   Title  Pt. will assume a mature tripod grasp on a pen while writing her name wiht 100% legibility.    Baseline  05/30/2019: Pt. is unable to assume a mature tripod grasp on a pen when drawing a line.    Time  12    Period  Weeks    Status  New    Target Date  08/29/19      OT LONG TERM GOAL #7   Title  Pt. will independently type a 3 sentence email message efficiently with 100% accuracy    Baseline  05/30/2019: Pt. is unable.    Time  12    Period  Weeks    Status  New    Target Date  08/29/19      OT LONG TERM GOAL #8   Title  Pt. will improve left hand [facility] skills in order to independentlymanipulate small objects needed during ADLs, and IADLs,    Baseline  06/09/2019: Left hand [facility] skills are limited.    Time  12    Period  Weeks    Status  New    Target Date  08/29/19      OT LONG TERM GOAL  #9   TITLE  Pt. will use her left hand to independently handle, and use  cooking, and baking utensils.    Baseline  05/30/2019: Pt. has difficulty    Time  0    Period  Weeks    Status  New    Target Date  08/29/19            Plan - 06/09/19 1156    Clinical Impression Statement  Pt. Is making progress overall. Pt. has an appointment on May 4th with a psychiatrist. Pt. plans to have counseling services, however has been having difficulty with scheduling secondary to decreased availability appointments.  Pt. required a change of body position form sitting to standing when removing the washers from the hooks while sustaining her LUE in elevation. Pt. was able to grasp the 1" sticks from the dish with her left hand with increased time, however was able to required the use of the right hand to assist to turning the sticks from horizontal, to a vertical direction. Pt. Presented with less tone, and tightness form the previous treatment session. Pt. Reports that she has resumed wearing her hand splint at night, and is doing her stretches.    OT Occupational Profile and History  Detailed Assessment- Review of Records and additional review of physical, cognitive, psychosocial history related to current functional performance    Occupational performance deficits (Please refer to evaluation for details):  ADL's;IADL's    Body Structure /  Function / Physical Skills  ADL;IADL;Coordination;Endurance;UE functional use;Decreased knowledge of precautions;Dexterity;FMC;ROM;Tone;Strength    Rehab Potential  Good    Clinical Decision Making  Several treatment options, min-mod task modification necessary    Comorbidities Affecting Occupational Performance:  May have comorbidities impacting occupational performance    Modification or Assistance to Complete Evaluation   Min-Moderate modification of tasks or assist with assess necessary to complete eval    OT Frequency  2x / week    OT Duration  12 weeks    OT Treatment/Interventions  Self-care/ADL training;DME and/or AE  instruction;Energy conservation;Therapeutic activities;Patient/family education;Therapeutic exercise;Neuromuscular education    Consulted and Agree with Plan of Care  Patient       Patient will benefit from skilled therapeutic intervention in order to improve the following deficits and impairments:   Body Structure / Function / Physical Skills: ADL, IADL, Coordination, Endurance, UE functional use, Decreased knowledge of precautions, Dexterity, FMC, ROM, Tone, Strength       Visit Diagnosis: Other lack of coordination  Muscle weakness (generalized)    Problem List Patient Active Problem List   Diagnosis Date Noted  . Postcoital UTI 05/24/2019  . Spastic hemiparesis ([place])   . Vascular headache   . Mood disorder in conditions classified elsewhere   . 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 Polysubstance dependence in early, early partial, sustained full, or sustained partial remission ([place])   . Dyslipidemia   . Ischemic stroke diagnosed during current admission ([place]) 02/13/2019  . MDD (major depressive disorder), recurrent episode, moderate ([place]) 02/10/2019  . CVA (cerebral vascular accident) ([place]) 02/07/2019  . TBI (traumatic brain injury) ([place]) 05/01/2016  . 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 Elevated troponin     Harrel Carina, MS, OTR/L 06/09/2019, 11:59 AM  [name] [facility] SERVICES [place], [place], [address] Phone: [phone]   Fax:  [phone]  Name: Jane Watkins MRN: [id number] Date of Birth: 1995-08-30

## 2019-06-09 NOTE — Telephone Encounter (Signed)
Return Mr. [name] call, reporting he discarded Ms. Eid Ambien due to confusion, mood swings and being disoriented. Also reports she still experiencing insomnia, looking for recommendations for her insomnia.

## 2019-06-09 NOTE — Therapy (Addendum)
[place] MAIN [facility] SERVICES [address] [place], [place], [address] Phone: [phone]   Fax:  [phone]  Physical Therapy Treatment  Patient Details  Name: Jane Watkins MRN: [id number] Date of Birth: [birth year] Referring Provider (PT): Dr. Earnest Conroy. Jane Watkins   Encounter Date: 06/09/2019    PT End of Session - 06/09/19 1326    Visit Number  19   Number of Visits  24    PT Start Time  1113    PT Stop Time  1145   PT Time Calculation (min)  32 min    Equipment Utilized During Treatment  Gait belt    Activity Tolerance  Patient tolerated treatment well;No increased pain    Behavior During Therapy  Flat affect      Date for PT Re-Evaluation 08/17/19     Past Medical History:  Diagnosis Date  . AKI (acute kidney injury) ([place]) 2018   "from overdose"  . Anxiety   . Daily headache   . Depression   . Drug overdose 04/2016   [name] 05/01/2016  . GERD (gastroesophageal reflux disease)    "when I was younger; gone now" (09/21/2017)  . IV drug abuse ([place]) 09/20/2017  . Migraine    "q couple weeks" (09/21/2017)  . Opioid abuse ([place])   . Overdose   . Stroke [facility])     Past Surgical History:  Procedure Laterality Date  . APPENDECTOMY  04/2013  . I & D EXTREMITY Left 09/20/2017   Procedure: IRRIGATION AND DEBRIDEMENT LEFT ARM;  Surgeon: [name], MD;  Location: [facility];  Service: Orthopedics;  Laterality: Left;  . TEE WITHOUT CARDIOVERSION N/A 02/10/2019   Procedure: TRANSESOPHAGEAL ECHOCARDIOGRAM (TEE);  Surgeon: [name], MD;  Location: [facility];  Service: Cardiovascular;  Laterality: N/A;  Polysubstance abuser  . TONGUE SURGERY  ~ 2007   "related to lisp"  . TRACHEOSTOMY  04/2016   [name] 05/01/2016    There were no vitals filed for this visit.     Subjective Assessment - 06/09/19 1455    Subjective  Pt reports that she is doing well. She needed help to put on her shoe with AFO today.    Patient is accompained by:   --   significant other   Pertinent History  Pt is 24 y.o. female presenting to hospital 02/07/19 initially,  MRI showing acute infarct R MCA territory affecting basal ganglia and affecting cortical and subcortical brain in a largely watershed distribution. After in hospital stay patient transitioned to CIR from 02/16/2019-03/01/2019.  PMH includes IV drug abuse, anxiety, depression, migraines, AKI, hypoxic ischemic encephalopathy 2018, opiate overdose 2018, h/o trach 2018, h/o I&D L UE. PLOF: lives with boyfriend in one story apartment, prior to CVA patient was independent, working.    Limitations  Walking;Reading;Lifting;Writing;House hold activities;Standing    How long can you sit comfortably?  NA    How long can you stand comfortably?  20 mins    How long can you walk comfortably?  20 mins    Diagnostic tests  see MRI results    Patient Stated Goals  Patient wants to maximize how much she can walk, get back to normal    Pain Onset  no pain      Treatment: TM .5 miles/ hour x backwards walking x 10 mins Side stepping on TM . 4 miles / hour x 5 mins each side Leg press 70 lbs x 20 x 3   Patient performed with instruction, verbal cues,  tactile cues of therapist: goal:increase tissue extensibility, promote proper posture, improve mobility       PT Education - 06/09/19 1326    Education provided  Yes    Education Details  HEP    Person(s) Educated  Patient    Methods  Explanation    Comprehension  Verbalized understanding;Need further instruction          Plan - 06/09/19 1327    Clinical Impression Statement  Patient instructed in advanced LE strengthening and intermediate balance exercise. Patient required min VCS to improve weight shift and to increase terminal knee extension for better stance control. Patient would benefit from additional skilled PT intervention to improve strength, balance and gait safety.   Personal Factors and Comorbidities  Behavior Pattern     Examination-Activity Limitations  Bathing;Dressing;Sit;Transfers;Sleep;Bed Mobility;Bend;Caring for Others;Carry;Toileting;Reach Overhead;Stand;Locomotion Level;Stairs;Squat;Lift;Hygiene/Grooming    Examination-Participation Restrictions  Church;Interpersonal Relationship;Personal Finances;Yard Work;Cleaning;Laundry;School;Community Activity;Medication Management;Driving;Meal Prep;Shop    Rehab Potential  Good    Clinical Impairments Affecting Rehab Potential  unsure of severity of cognitive/psych deficits    PT Frequency  2x / week    PT Duration  12 weeks    PT Treatment/Interventions  ADLs/Self Care Home Management;DME Instruction;Gait training;Stair training;Functional mobility training;Therapeutic activities;Therapeutic exercise;Balance training;Neuromuscular re-education;Cognitive remediation;Patient/family education;Vestibular;Energy conservation;Cryotherapy;Ultrasound;Electrical Stimulation;Fluidtherapy;Iontophoresis 21m/ml Dexamethasone;Canalith Repostioning;Moist Heat;Traction;Passive range of motion;Manual techniques;Joint Manipulations;Spinal Manipulations;Splinting;Taping;Scar mobilization;Wheelchair mobility training;Prosthetic Training;Orthotic Fit/Training;Dry needling;Visual/perceptual remediation/compensation;Aquatic Therapy    PT Next Visit Plan  continue with LE strengthening, NME, and motor control activities    PT Home Exercise Plan  Access Code: [email or web address]    Consulted and Agree with Plan of Care  Patient       Patient will benefit from skilled therapeutic intervention in order to improve the following deficits and impairments:  Decreased activity tolerance, Decreased balance, Decreased cognition, Decreased endurance, Decreased knowledge of use of DME, Decreased mobility, Decreased strength, Impaired perceived functional ability, Postural dysfunction, Abnormal gait, Difficulty walking, Impaired tone, Decreased range of motion, Decreased coordination, Impaired UE  functional use, Pain                          PT Short Term Goals - 04/25/19 1627      PT SHORT TERM GOAL #1   Title  Pt will be independent with initial HEP in order to indicate improved strength and decreased fall risk.    Baseline  initial HEP administered on eval 03/02/2019; 04/25/19: "not as much as should."    Time  6    Period  Weeks    Status  Partially Met    Target Date  04/13/19        PT Long Term Goals - 04/25/19 1628      PT LONG TERM GOAL #1   Title  Pt will be independent with final HEP in order to indicate improved strength and decreased fall risk.    Baseline  initial HEP administered on eval 03/02/19; 04/25/19: "not as much as a should."    Time  12    Period  Weeks    Status  On-going    Target Date  05/25/19      PT LONG TERM GOAL #2   Title  Pt will decrease 5TSTS to less than 10 seconds in order to demonstrate clinically significant improvement in LE strength and functional mobility.    Baseline  03/02/2019: 17 seconds without UE support, dependent on RLE, very little weight shift to LLE; 04/25/19: 7.7s, no UE, decreased weight shifting  to LLE;    Time  12    Period  Weeks    Status  Achieved      PT LONG TERM GOAL #3   Title  Pt will perform 6MWT > 1051f with LRAD at mod I level in order to indicate safe community/leisure negotiation.    Baseline  03/02/2019 5341f 04/25/19: 1000'    Time  12    Period  Weeks    Status  Partially Met    Target Date  05/25/19      PT LONG TERM GOAL #4   Title  The patient will demonstrate at least 1/2 grade improvement in LE MMT grade to improve ability to perform functional activities.    Baseline  see eval for details 03/02/2019; 04/25/19: see visit note, significant improvement, still lacking L anikle dorsiflexion, eversion, or inversion    Time  12    Period  Weeks    Status  Partially Met    Target Date  05/25/19      PT LONG TERM GOAL #5   Title  Pt will ambulate >1 m/s with LRAD mod I to  indicate gait velocity of community ambulator.    Baseline  03/02/19: self selected: .57 m/s, fastest .7275mwith quad cane; 04/25/19: Self-selected: 10.8s = 0.93 m/s; Fastest: 8.8s = 1.14 m/s, performed with no AD    Time  12    Period  Weeks    Status  Partially Met    Target Date  05/25/19              Patient will benefit from skilled therapeutic intervention in order to improve the following deficits and impairments:  Decreased activity tolerance, Decreased balance, Decreased cognition, Decreased endurance, Decreased knowledge of use of DME, Decreased mobility, Decreased strength, Impaired perceived functional ability, Postural dysfunction, Abnormal gait, Difficulty walking, Impaired tone, Decreased range of motion, Decreased coordination, Impaired UE functional use, Pain  Visit Diagnosis: Muscle weakness (generalized)  Other lack of coordination  Right middle cerebral artery stroke (HCC)  Other abnormalities of gait and mobility  Acute pain of left shoulder  Ischemic stroke diagnosed during current admission (HCCRallsFrontal lobe and executive function deficit     Problem List Patient Active Problem List   Diagnosis Date Noted  . Postcoital UTI 05/24/2019  . Spastic hemiparesis ([place]CC[place] . Vascular headache   . Mood disorder in conditions classified elsewhere   . Right middle cerebral artery stroke ([place]CC[place]1/07/2019  . Polysubstance dependence in early, early partial, sustained full, or sustained partial remission ([place]CC[place] . Dyslipidemia   . Ischemic stroke diagnosed during current admission (HCC[place]1/04/2019  . MDD (major depressive disorder), recurrent episode, moderate (HCCEnosburg Falls2/31/2020  . CVA (cerebral vascular accident) ([place]CC[place]2/28/2020  . TBI (traumatic brain injury) ([place]CC[place]3/22/2018  . 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 Elevated troponin     ManAlanson PulsPT VirginiaT 06/11/2019, 2:27 PM  [facility] [id number] [place]one: [phone]:  [phone]: LydShakari QaziN: [id number] of Birth: 6/811/01/1996

## 2019-06-10 ENCOUNTER — Other Ambulatory Visit: Payer: Self-pay | Admitting: Neurology

## 2019-06-10 NOTE — Telephone Encounter (Signed)
Called and left generic message, For patient to call back about the option to reschedule to when patient is on her menstrual cycle to just coming in for consult. Pending patient call back

## 2019-06-11 NOTE — Addendum Note (Signed)
Addended by: Ezekiel Ina on: 06/11/2019 03:13 PM   Modules accepted: Orders

## 2019-06-13 ENCOUNTER — Encounter: Payer: Self-pay | Admitting: Occupational Therapy

## 2019-06-13 ENCOUNTER — Ambulatory Visit: Payer: 59 | Attending: Physical Medicine & Rehabilitation | Admitting: Occupational Therapy

## 2019-06-13 ENCOUNTER — Other Ambulatory Visit: Payer: Self-pay

## 2019-06-13 ENCOUNTER — Encounter: Payer: Self-pay | Admitting: Physical Therapy

## 2019-06-13 ENCOUNTER — Ambulatory Visit: Payer: 59 | Admitting: Physical Therapy

## 2019-06-13 DIAGNOSIS — R278 Other lack of coordination: Secondary | ICD-10-CM | POA: Diagnosis present

## 2019-06-13 DIAGNOSIS — I639 Cerebral infarction, unspecified: Secondary | ICD-10-CM

## 2019-06-13 DIAGNOSIS — M6281 Muscle weakness (generalized): Secondary | ICD-10-CM | POA: Insufficient documentation

## 2019-06-13 DIAGNOSIS — R2689 Other abnormalities of gait and mobility: Secondary | ICD-10-CM | POA: Insufficient documentation

## 2019-06-13 DIAGNOSIS — M25512 Pain in left shoulder: Secondary | ICD-10-CM | POA: Diagnosis present

## 2019-06-13 DIAGNOSIS — R41844 Frontal lobe and executive function deficit: Secondary | ICD-10-CM | POA: Insufficient documentation

## 2019-06-13 DIAGNOSIS — I63511 Cerebral infarction due to unspecified occlusion or stenosis of right middle cerebral artery: Secondary | ICD-10-CM

## 2019-06-13 NOTE — Therapy (Signed)
[place] MAIN [facility] SERVICES [address] [place], [place], [address] Phone: [phone]   Fax:  [phone]  Occupational Therapy Progress Note  Dates of reporting period  04/28/2019  to   06/13/2019  Patient Details  Name: Jane Watkins MRN: [id number] Date of Birth: [birth year] Referring Provider (OT): Dr. [name]   Encounter Date: 06/13/2019  OT End of Session - 06/13/19 1751    Visit Number  20    Number of Visits  24    Date for OT Re-Evaluation  08/29/19    Authorization Type  Progress report period starting 03/07/2019    OT Start Time  1330    OT Stop Time  1345    OT Time Calculation (min)  15 min    Behavior During Therapy  Flat affect       Past Medical History:  Diagnosis Date  . AKI (acute kidney injury) ([place]) 2018   "from overdose"  . Anxiety   . Daily headache   . Depression   . Drug overdose 04/2016   [name] 05/01/2016  . GERD (gastroesophageal reflux disease)    "when I was younger; gone now" (09/21/2017)  . IV drug abuse ([place]) 09/20/2017  . Migraine    "q couple weeks" (09/21/2017)  . Opioid abuse ([place])   . Overdose   . Stroke [facility])     Past Surgical History:  Procedure Laterality Date  . APPENDECTOMY  04/2013  . I & D EXTREMITY Left 09/20/2017   Procedure: IRRIGATION AND DEBRIDEMENT LEFT ARM;  Surgeon: [name], MD;  Location: [facility];  Service: Orthopedics;  Laterality: Left;  . TEE WITHOUT CARDIOVERSION N/A 02/10/2019   Procedure: TRANSESOPHAGEAL ECHOCARDIOGRAM (TEE);  Surgeon: [name], MD;  Location: [facility];  Service: Cardiovascular;  Laterality: N/A;  Polysubstance abuser  . TONGUE SURGERY  ~ 2007   "related to lisp"  . TRACHEOSTOMY  04/2016   [name] 05/01/2016    There were no vitals filed for this visit.  Subjective Assessment - 06/13/19 1750    Subjective   Pt. was late for the session, and received a phone call prior to the session about finally being able to scedule counseling sessions.     Patient is accompanied by:  Family member    Pertinent History  Per chart. Pt. is a 24 y.o.female with a history of IV drug use, polysubstance as well as tobacco abuse, anxiety. Pt. presented to [facility] on 02/07/2019 after being found unresponsive.  EMS contacted patient received Narcan with some improvement in mental status.  Admission labs with WBC 20,700, potassium 5.2, BUN 25, creatinine 0.67, urine drug screen positive cocaine, marijuana and benzodiazepines, alcohol level less than 10, SARS coronavirus negative.  Cranial CT scan showed areas of indistinct low-density in the right basal ganglia with mass-effect on the right lateral ventricle.  Patient did not receive TPA.  MRI showed acute infarction of right MCA territory, affecting the basal ganglia and affecting the cortical and subcortical brain in a largely watershed distribution. Pt. had a previous inpatient rehab admission 05/01/2016 to 05/09/2016 for hypoxic encephalopathy after drug overdose requiring tracheostomy, and was later decannulated. Pt. resides with her boyfriend in an apartment. 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    Patient Stated Goals  Patient reports she would like to be as independent as possible with all her daily tasks.    Currently in Pain?  No/denies       OT TREATMENT  Neuro muscular re-education:  Pt. worked on using her left hand to grasp, and stack 1" washers into stacks of 4.   Therapeutic Exercise:  Pt. Worked on the [employer] for 8 min. with constant monitoring of the BUEs. Pt. worked on changing, and alternating forward reverse position every 2 min. Rest breaks were required. Pt. Worked on level 4.0.  Pt. was late for the session. Upon arrival pt. was on the telephone scheduling an appointment with counseling services for June. Pt. Continues to present with increased tone in her left hand digits, and thumb. Pt. Required alternating  Reps of weightbearing through her left hand  during [facility] tasks. Pt. Continues to make steady progress, and continues to work on normalizing tone, and  improving LUE strength,  motor control, and [facility] skills in order to increase engagement of the LUE during ADLs, and IADLs.                        OT Education - 06/13/19 1751    Education Details  thumb movements, grasping patterns    Person(s) Educated  Patient    Methods  Explanation;Demonstration;Tactile cues;Verbal cues;Handout    Comprehension  Verbalized understanding;Returned demonstration;Need further instruction          OT Long Term Goals - 06/13/19 1753      OT LONG TERM GOAL #2   Title  Pt. will increase active shoulder abduction by 20 degrees in preparation to be able to wash her hair.    Baseline  Shoulder abduction: 108 Pt. is able to rinse her hair not wash it. 04/25/2019: shoulder abduction:95. Pt. is unable to use her LUE to wash her hair. Eval: Active shoulder abduction 56 degrees.    Time  12    Period  Weeks    Status  Partially Met    Target Date  08/29/19      OT LONG TERM GOAL #6   Title  Pt. will assume a mature tripod grasp on a pen while writing her name wiht 100% legibility.    Baseline  Pt. conitnues to be unable to assume a mature tripod grasp on a pen when drawing a line.    Time  12    Period  Weeks    Status  On-going    Target Date  08/29/19      OT LONG TERM GOAL #7   Title  Pt. will independently type a 3 sentence email message efficiently with 100% accuracy    Baseline  Pt. is unable.    Time  12    Period  Weeks    Status  On-going      OT LONG TERM GOAL #8   Title  Pt. will improve left hand [facility] skills in order to independentlymanipulate small objects needed during ADLs, and IADLs,    Baseline  Left hand [facility] skills are limited.    Time  12    Period  Weeks    Status  On-going      OT LONG TERM GOAL  #9   TITLE  Pt. will use her left hand to independently handle, and use cooking, and baking utensils.     Baseline  Pt. conitnues to have difficulty    Time  12    Period  Weeks    Status  On-going    Target Date  08/29/19            Plan - 06/13/19 1752  Clinical Impression Statement  Pt. was late for the session. Upon arrival pt. was on the telephone scheduling an appointment with counseling services for June. Pt. Continues to present with increased tone in her left hand digits, and thumb. Pt. Required alternating  Reps of weightbearing through her left hand during [facility] tasks. Pt. Continues to make steady progress, and continues to work on normalizing tone, and  improving LUE strength,  motor control, and [facility] skills in order to increase engagement of the LUE during ADLs, and IADLs.    OT Occupational Profile and History  Detailed Assessment- Review of Records and additional review of physical, cognitive, psychosocial history related to current functional performance    Occupational performance deficits (Please refer to evaluation for details):  ADL's;IADL's    Body Structure / Function / Physical Skills  ADL;IADL;Coordination;Endurance;UE functional use;Decreased knowledge of precautions;Dexterity;FMC;ROM;Tone;Strength    Rehab Potential  Good    Clinical Decision Making  Several treatment options, min-mod task modification necessary    Comorbidities Affecting Occupational Performance:  May have comorbidities impacting occupational performance    Modification or Assistance to Complete Evaluation   Min-Moderate modification of tasks or assist with assess necessary to complete eval    OT Frequency  2x / week    OT Duration  12 weeks    OT Treatment/Interventions  Self-care/ADL training;DME and/or AE instruction;Energy conservation;Therapeutic activities;Patient/family education;Therapeutic exercise;Neuromuscular education       Patient will benefit from skilled therapeutic intervention in order to improve the following deficits and impairments:   Body Structure / Function / Physical Skills:  ADL, IADL, Coordination, Endurance, UE functional use, Decreased knowledge of precautions, Dexterity, FMC, ROM, Tone, Strength       Visit Diagnosis: Muscle weakness (generalized)  Other lack of coordination    Problem List Patient Active Problem List   Diagnosis Date Noted  . Postcoital UTI 05/24/2019  . Spastic hemiparesis ([place])   . Vascular headache   . Mood disorder in conditions classified elsewhere   . 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 Polysubstance dependence in early, early partial, sustained full, or sustained partial remission ([place])   . Dyslipidemia   . Ischemic stroke diagnosed during current admission ([place]) 02/13/2019  . MDD (major depressive disorder), recurrent episode, moderate ([place]) 02/10/2019  . CVA (cerebral vascular accident) ([place]) 02/07/2019  . TBI (traumatic brain injury) ([place]) 05/01/2016  . 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 Elevated troponin     Harrel Carina, MS, OTR/L 06/13/2019, 5:57 PM  [place] MAIN [facility] SERVICES [address] [place], [place], [address] Phone: [phone]   Fax:  [phone]  Name: Hebe Merriwether MRN: [id number] Date of Birth: 30-Aug-1995

## 2019-06-13 NOTE — Therapy (Signed)
[place] MAIN [facility] SERVICES [place], [place], [address] Phone: [phone]   Fax:  [phone]  Physical Therapy Treatment Physical Therapy Progress Note   Dates of reporting period  04/25/19  to  06/13/19  Patient Details  Name: Jane Watkins MRN: [id number] Date of Birth: September 22, 1995 Referring Provider (PT): Dr. Earnest Conroy. [name]   Encounter Date: 06/13/2019  PT End of Session - 06/13/19 1454    Visit Number  20    Number of Visits  24    Date for PT Re-Evaluation  08/17/19    Authorization Type  [employer], 60 visit limit per discipline    PT Start Time  0145    PT Stop Time  0245    PT Time Calculation (min)  60 min    Equipment Utilized During Treatment  Gait belt    Activity Tolerance  Patient tolerated treatment well;No increased pain    Behavior During Therapy  Flat affect       Past Medical History:  Diagnosis Date  . AKI (acute kidney injury) ([place]) 2018   "from overdose"  . Anxiety   . Daily headache   . Depression   . Drug overdose 04/2016   [name] 05/01/2016  . GERD (gastroesophageal reflux disease)    "when I was younger; gone now" (09/21/2017)  . IV drug abuse ([place]) 09/20/2017  . Migraine    "q couple weeks" (09/21/2017)  . Opioid abuse ([facility])   . Overdose   . Stroke [facility])     Past Surgical History:  Procedure Laterality Date  . APPENDECTOMY  04/2013  . I & D EXTREMITY Left 09/20/2017   Procedure: IRRIGATION AND DEBRIDEMENT LEFT ARM;  Surgeon: [name], MD;  Location: [facility];  Service: Orthopedics;  Laterality: Left;  . TEE WITHOUT CARDIOVERSION N/A 02/10/2019   Procedure: TRANSESOPHAGEAL ECHOCARDIOGRAM (TEE);  Surgeon: [name], MD;  Location: [facility];  Service: Cardiovascular;  Laterality: N/A;  Polysubstance abuser  . TONGUE SURGERY  ~ 2007   "related to lisp"  . TRACHEOSTOMY  04/2016   [name] 05/01/2016    There were no vitals filed for this visit.  Subjective Assessment - 06/13/19 1454     Subjective  Pt reports that she is doing well. She needs help to wear her her AFO . Denies any pain upon arrival today. No specific questions or concerns.    Patient is accompained by:  --   significant other   Pertinent History  Pt is 24 y.o. female presenting to hospital 02/07/19 initially,  MRI showing acute infarct R MCA territory affecting basal ganglia and affecting cortical and subcortical brain in a largely watershed distribution. After in hospital stay patient transitioned to CIR from 02/16/2019-03/01/2019.  PMH includes IV drug abuse, anxiety, depression, migraines, AKI, hypoxic ischemic encephalopathy 2018, opiate overdose 2018, h/o trach 2018, h/o I&D L UE. PLOF: lives with boyfriend in one story apartment, prior to CVA patient was independent, working.    Limitations  Walking;Reading;Lifting;Writing;House hold activities;Standing    How long can you sit comfortably?  NA    How long can you stand comfortably?  20 mins    How long can you walk comfortably?  20 mins    Diagnostic tests  see MRI results    Patient Stated Goals  Patient wants to maximize how much she can walk, get back to normal    Currently in Pain?  No/denies    Pain Score  0-No pain  Pain Onset  Today       Treatment: Matrix at 22. 5 lbs fwd/bwd, side to sde Lying supine bridges x 15 x 2 Sidelying hip abd x 10 BLE sidelying hip flex / ext x 10 BLE Leg press 70 lbs x 20 x 3   Patient performed with instruction, verbal cues, tactile cues of therapist: goal:increase tissue extensibility, promote proper posture, improve mobility                        PT Education - 06/13/19 1454    Education provided  Yes    Education Details  HEP    Person(s) Educated  Patient    Methods  Explanation    Comprehension  Verbalized understanding       PT Short Term Goals - 04/25/19 1627      PT SHORT TERM GOAL #1   Title  Pt will be independent with initial HEP in order to indicate improved strength  and decreased fall risk.    Baseline  initial HEP administered on eval 03/02/2019; 04/25/19: "not as much as should."    Time  6    Period  Weeks    Status  Partially Met    Target Date  04/13/19        PT Long Term Goals - 06/13/19 1516      PT LONG TERM GOAL #1   Title  Pt will be independent with final HEP in order to indicate improved strength and decreased fall risk.    Baseline  initial HEP administered on eval 03/02/19; 04/25/19: "not as much as a should."    Time  12    Period  Weeks    Status  On-going    Target Date  08/17/19      PT LONG TERM GOAL #2   Title  Pt will decrease 5TSTS to less than 10 seconds in order to demonstrate clinically significant improvement in LE strength and functional mobility.    Baseline  03/02/2019: 17 seconds without UE support, dependent on RLE, very little weight shift to LLE; 04/25/19: 7.7s, no UE, decreased weight shifting to LLE;    Time  12    Period  Weeks    Status  Achieved      PT LONG TERM GOAL #3   Title  Pt will perform 6MWT > 1049f with LRAD at mod I level in order to indicate safe community/leisure negotiation.    Baseline  03/02/2019 5364f 04/25/19: 1000'    Time  12    Period  Weeks    Status  Partially Met    Target Date  08/17/19      PT LONG TERM GOAL #4   Title  The patient will demonstrate at least 1/2 grade improvement in LE MMT grade to improve ability to perform functional activities.    Baseline  see eval for details 03/02/2019; 04/25/19: see visit note, significant improvement, still lacking L anikle dorsiflexion, eversion, or inversion    Time  12    Period  Weeks    Status  Partially Met    Target Date  08/17/19      PT LONG TERM GOAL #5   Title  Pt will ambulate >1 m/s with LRAD mod I to indicate gait velocity of community ambulator.    Baseline  03/02/19: self selected: .57 m/s, fastest .7261mwith quad cane; 04/25/19: Self-selected: 10.8s = 0.93 m/s; Fastest: 8.8s = 1.14 m/s, performed with  no AD    Time  12     Period  Weeks    Status  Partially Met    Target Date  08/17/19            Plan - 06/13/19 1455    Clinical Impression Statement Patient's condition has the potential to improve in response to therapy. Maximum improvement is yet to be obtained. The anticipated improvement is attainable and reasonable in a generally predictable time.  Patient reports that she is able to do her exercises and is walking more each week with her dog. She will continue to benefit from skilled PT to improve mobility and function.    Personal Factors and Comorbidities  Behavior Pattern    Examination-Activity Limitations  Bathing;Dressing;Sit;Transfers;Sleep;Bed Mobility;Bend;Caring for Others;Carry;Toileting;Reach Overhead;Stand;Locomotion Level;Stairs;Squat;Lift;Hygiene/Grooming    Examination-Participation Restrictions  Church;Interpersonal Relationship;Personal Finances;Yard Work;Cleaning;Laundry;School;Community Activity;Medication Management;Driving;Meal Prep;Shop    Rehab Potential  Good    Clinical Impairments Affecting Rehab Potential  unsure of severity of cognitive/psych deficits    PT Frequency  2x / week    PT Duration  12 weeks    PT Treatment/Interventions  ADLs/Self Care Home Management;DME Instruction;Gait training;Stair training;Functional mobility training;Therapeutic activities;Therapeutic exercise;Balance training;Neuromuscular re-education;Cognitive remediation;Patient/family education;Vestibular;Energy conservation;Cryotherapy;Ultrasound;Electrical Stimulation;Fluidtherapy;Iontophoresis 4m/ml Dexamethasone;Canalith Repostioning;Moist Heat;Traction;Passive range of motion;Manual techniques;Joint Manipulations;Spinal Manipulations;Splinting;Taping;Scar mobilization;Wheelchair mobility training;Prosthetic Training;Orthotic Fit/Training;Dry needling;Visual/perceptual remediation/compensation;Aquatic Therapy    PT Next Visit Plan  continue with LE strengthening, NME, and motor control activities     PT Home Exercise Plan  Access Code: [email or web address]    Consulted and Agree with Plan of Care  Patient       Patient will benefit from skilled therapeutic intervention in order to improve the following deficits and impairments:  Decreased activity tolerance, Decreased balance, Decreased cognition, Decreased endurance, Decreased knowledge of use of DME, Decreased mobility, Decreased strength, Impaired perceived functional ability, Postural dysfunction, Abnormal gait, Difficulty walking, Impaired tone, Decreased range of motion, Decreased coordination, Impaired UE functional use, Pain  Visit Diagnosis: Muscle weakness (generalized)  Other lack of coordination  Right middle cerebral artery stroke (HCC)  Other abnormalities of gait and mobility  Acute pain of left shoulder  Ischemic stroke diagnosed during current admission ([place]  Frontal lobe and executive function deficit     Problem List Patient Active Problem List   Diagnosis Date Noted  . Postcoital UTI 05/24/2019  . Spastic hemiparesis ([facility]   . Vascular headache   . Mood disorder in conditions classified elsewhere   . Right middle cerebral artery stroke ([facility] [place] 02/16/2019  . Polysubstance dependence in early, early partial, sustained full, or sustained partial remission ([place]   . Dyslipidemia   . Ischemic stroke diagnosed during current admission ([facility] 02/13/2019  . MDD (major depressive disorder), recurrent episode, moderate ([place] 02/10/2019  . CVA (cerebral vascular accident) ([place] 02/07/2019  . TBI (traumatic brain injury) ([facility] 05/01/2016  . 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 Elevated troponin     MAlanson Puls PVirginiaDPT 06/13/2019, 3:22 PM  [place] [facility] [address] [place] [address]Phone: [phone]  Fax:  [phone] Name: Jane TarterMRN: [id number] of Birth: [birth year]

## 2019-06-14 ENCOUNTER — Telehealth (HOSPITAL_COMMUNITY): Payer: 59 | Admitting: Professional

## 2019-06-14 NOTE — Progress Notes (Deleted)
Jane Noe, MD   No chief complaint on file.   HPI:      [name] is a 24 y.o. No obstetric history on file. who LMP was No LMP recorded., presents today for NP [facility] consult for Paragard IUD for Jane Watkins. Hx of CVA.    Past Medical History:  Diagnosis Date  . AKI (acute kidney injury) ([place]) 2018   "from overdose"  . Anxiety   . Daily headache   . Depression   . Drug overdose 04/2016   Jane Watkins 05/01/2016  . GERD (gastroesophageal reflux disease)    "when I was younger; gone now" (09/21/2017)  . IV drug abuse ([place]) 09/20/2017  . Migraine    "q couple weeks" (09/21/2017)  . Opioid abuse ([place])   . Overdose   . Stroke [facility])     Past Surgical History:  Procedure Laterality Date  . APPENDECTOMY  04/2013  . I & D EXTREMITY Left 09/20/2017   Procedure: IRRIGATION AND DEBRIDEMENT LEFT ARM;  Surgeon: [name], MD;  Location: [facility];  Service: Orthopedics;  Laterality: Left;  . TEE WITHOUT CARDIOVERSION N/A 02/10/2019   Procedure: TRANSESOPHAGEAL ECHOCARDIOGRAM (TEE);  Surgeon: [name], MD;  Location: [facility];  Service: Cardiovascular;  Laterality: N/A;  Polysubstance abuser  . TONGUE SURGERY  ~ 2007   "related to lisp"  . TRACHEOSTOMY  04/2016   Jane Watkins 05/01/2016    Family History  Problem Relation Age of Onset  . Anemia Father   . Healthy Sister   . Thyroid cancer Maternal Grandmother   . Lung cancer Maternal Grandfather        metastasized  . Lung cancer Paternal Grandfather     Social History   Socioeconomic History  . Marital status: Significant Other    Spouse name: Not on file  . Number of children: Not on file  . Years of education: high school  . Highest education level: Not on file  Occupational History  . Not on file  Tobacco Use  . Smoking status: Current Every Day Smoker    Packs/day: 0.50    Years: 10.00    Pack years: 5.00    Types: Cigarettes  . Smokeless tobacco: Never Used  Substance and Sexual Activity  . Alcohol use: No    . Drug use: Not Currently    Types: IV, Heroin, Cocaine, Marijuana    Comment: last use of heroin and cocaine was 01/2019, marijuana daily  . Sexual activity: Yes    Birth control/protection: Condom    Comment: occasionally   Other Topics Concern  . Not on file  Social History Narrative   05/24/19   From: [place], [place]   Living: with boyfriend Jane [place]) -- former husband died from overdose   Work: not currently, in the application process for disability      Family: good relationship with dad who lives in [place], mom is in [place]. [place] relationship with bother and sister      Enjoys: training her puppy (Jane Watkins)      Exercise: walking, exercise bike in the apartment   Diet: not the best, tries to cook at home, fruit/veggies      Safety   Seat belts: Yes    Guns: No   Safe in relationships: Yes    Social Determinants of Radio broadcast assistant Strain:   . Difficulty of Paying Living Expenses:   Food Insecurity:   . Worried About Charity fundraiser in the Last  Year:   . Ran Out of Food in the Last Year:   Transportation Needs:   . Freight forwarder (Medical):   Jane Watkins Kitchen Lack of Transportation (Non-Medical):   Physical Activity:   . Days of Exercise per Week:   . Minutes of Exercise per Session:   Stress:   . Feeling of Stress :   Social Connections:   . Frequency of Communication with Friends and Family:   . Frequency of Social Gatherings with Friends and Family:   . Attends Religious Services:   . Active Member of Clubs or Organizations:   . Attends Banker Meetings:   Jane Watkins Kitchen Marital Status:   Intimate Partner Violence:   . Fear of Current or Ex-Partner:   . Emotionally Abused:   Jane Watkins Kitchen Physically Abused:   . Sexually Abused:     Outpatient Medications Prior to Visit  Medication Sig Dispense Refill  . acetaminophen (TYLENOL) 325 MG tablet Take 2 tablets (650 mg total) by mouth every 4 (four) hours as needed for mild pain (or temp > 37.5 C (99.5 F)).    Jane Watkins Kitchen  atorvastatin (LIPITOR) 20 MG tablet Take 1 tablet (20 mg total) by mouth daily at 6 PM. 90 tablet 1  . benzoyl peroxide 5 % external liquid Apply topically 2 (two) times daily.    . busPIRone (BUSPAR) 5 MG tablet Take 1 tablet (5 mg total) by mouth 3 (three) times daily. 60 tablet 1  . clopidogrel (PLAVIX) 75 MG tablet TAKE 1 TABLET BY MOUTH EVERY DAY 30 tablet 0  . FLUoxetine (PROZAC) 40 MG capsule Take 1 capsule (40 mg total) by mouth daily. 30 capsule 3  . nitrofurantoin, macrocrystal-monohydrate, (MACROBID) 100 MG capsule Take 1 capsule (100 mg total) by mouth daily as needed (within 2 hours after intercourse). 30 capsule 1  . pantoprazole (PROTONIX) 40 MG tablet Take 1 tablet (40 mg total) by mouth daily. 30 tablet 0  . QUEtiapine (SEROQUEL) 50 MG tablet Take 1 tablet (50 mg total) by mouth at bedtime. 30 tablet 3  . tiZANidine (ZANAFLEX) 2 MG tablet TAKE 1 TABLET (2 MG TOTAL) BY MOUTH EVERY 12 (TWELVE) HOURS. 60 tablet 0  . topiramate (TOPAMAX) 50 MG tablet TAKE 2 TABLETS (100 MG TOTAL) BY MOUTH 2 (TWO) TIMES DAILY. 360 tablet 0  . zolpidem (AMBIEN CR) 12.5 MG CR tablet Take 1 tablet (12.5 mg total) by mouth at bedtime as needed for sleep. 30 tablet 3   Facility-Administered Medications Prior to Visit  Medication Dose Route Frequency Provider Last Rate Last Admin  . busPIRone (BUSPAR) tablet 5 mg  5 mg Oral BID Jane Watkins, Jane Pry, MD          ROS:  Review of Systems BREAST: No symptoms   OBJECTIVE:   Vitals:  There were no vitals taken for this visit.  Physical Exam  Results: No results found for this or any previous visit (from the past 24 hour(s)).   Assessment/Plan: No diagnosis found.    No orders of the defined types were placed in this encounter.     No follow-ups on file.  Chabely Norby B. Dasia Guerrier, PA-C 06/14/2019 3:37 PM

## 2019-06-15 ENCOUNTER — Encounter: Payer: 59 | Admitting: Occupational Therapy

## 2019-06-15 ENCOUNTER — Encounter: Payer: 59 | Admitting: Obstetrics and Gynecology

## 2019-06-15 ENCOUNTER — Telehealth (HOSPITAL_COMMUNITY): Payer: Self-pay | Admitting: Professional

## 2019-06-15 NOTE — Telephone Encounter (Signed)
Patient called to reschedule due to feeling ill. Patient is rescheduled to 07/06/19 with [facility] at 10am for poss Paraguard placement

## 2019-06-16 ENCOUNTER — Other Ambulatory Visit: Payer: Self-pay

## 2019-06-16 ENCOUNTER — Encounter: Payer: Self-pay | Admitting: Occupational Therapy

## 2019-06-16 ENCOUNTER — Telehealth: Payer: Self-pay | Admitting: *Deleted

## 2019-06-16 ENCOUNTER — Ambulatory Visit: Payer: 59

## 2019-06-16 ENCOUNTER — Ambulatory Visit: Payer: 59 | Admitting: Occupational Therapy

## 2019-06-16 DIAGNOSIS — M6281 Muscle weakness (generalized): Secondary | ICD-10-CM

## 2019-06-16 DIAGNOSIS — R278 Other lack of coordination: Secondary | ICD-10-CM

## 2019-06-16 DIAGNOSIS — I63511 Cerebral infarction due to unspecified occlusion or stenosis of right middle cerebral artery: Secondary | ICD-10-CM

## 2019-06-16 DIAGNOSIS — R2689 Other abnormalities of gait and mobility: Secondary | ICD-10-CM

## 2019-06-16 NOTE — Telephone Encounter (Signed)
Received fax refill request for clopidogrel and tizanidine

## 2019-06-16 NOTE — Therapy (Signed)
[place] MAIN [facility] SERVICES [address] [place], [place], [address] Phone: [phone]   Fax:  [phone]  Occupational Therapy Treatment  Patient Details  Name: Jane Watkins MRN: [id number] Date of Birth: 1996/01/28 Referring Provider (OT): Dr. [name]   Encounter Date: 06/16/2019  OT End of Session - 06/16/19 1500    Visit Number  21    Number of Visits  24    Date for OT Re-Evaluation  08/29/19    Authorization Type  Progress report period starting 03/07/2019    OT Start Time  1445    OT Stop Time  1530    OT Time Calculation (min)  45 min    Activity Tolerance  Patient tolerated treatment well    Behavior During Therapy  Flat affect       Past Medical History:  Diagnosis Date  . AKI (acute kidney injury) ([place]) 2018   "from overdose"  . Anxiety   . Daily headache   . Depression   . Drug overdose 04/2016   [name] 05/01/2016  . GERD (gastroesophageal reflux disease)    "when I was younger; gone now" (09/21/2017)  . IV drug abuse ([place]) 09/20/2017  . Migraine    "q couple weeks" (09/21/2017)  . Opioid abuse ([place])   . Overdose   . Stroke [facility])     Past Surgical History:  Procedure Laterality Date  . APPENDECTOMY  04/2013  . I & D EXTREMITY Left 09/20/2017   Procedure: IRRIGATION AND DEBRIDEMENT LEFT ARM;  Surgeon: [name], MD;  Location: [place];  Service: Orthopedics;  Laterality: Left;  . TEE WITHOUT CARDIOVERSION N/A 02/10/2019   Procedure: TRANSESOPHAGEAL ECHOCARDIOGRAM (TEE);  Surgeon: [name], MD;  Location: [facility];  Service: Cardiovascular;  Laterality: N/A;  Polysubstance abuser  . TONGUE SURGERY  ~ 2007   "related to lisp"  . TRACHEOSTOMY  04/2016   [name] 05/01/2016    There were no vitals filed for this visit.  Subjective Assessment - 06/16/19 1450    Subjective   Pt. was late for the session, and received a phone call prior to the session about finally being able to scedule counseling sessions.     Pertinent History  Per chart. Pt. is a 24 y.o.female with a history of IV drug use, polysubstance as well as tobacco abuse, anxiety. Pt. presented to [facility] on 02/07/2019 after being found unresponsive.  EMS contacted patient received Narcan with some improvement in mental status.  Admission labs with WBC 20,700, potassium 5.2, BUN 25, creatinine 0.67, urine drug screen positive cocaine, marijuana and benzodiazepines, alcohol level less than 10, SARS coronavirus negative.  Cranial CT scan showed areas of indistinct low-density in the right basal ganglia with mass-effect on the right lateral ventricle.  Patient did not receive TPA.  MRI showed acute infarction of right MCA territory, affecting the basal ganglia and affecting the cortical and subcortical brain in a largely watershed distribution. Pt. had a previous inpatient rehab admission 05/01/2016 to 05/09/2016 for hypoxic encephalopathy after drug overdose requiring tracheostomy, and was later decannulated. Pt. resides with her boyfriend in an apartment. 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    Patient Stated Goals  Patient reports she would like to be as independent as possible with all her daily tasks.    Currently in Pain?  No/denies      OT TREATMENT    Neuro muscular re-education:  Pt. worked on using her Left hand  for grasping, and reaching for shapes on the shape tower. Pt. worked on moving the shapes through 3 rungs of the shape tower with the start point at the tabletop from a seated position. Pt. was able to complete it from a seated position.  Pt. Completed  Reaching to the highest 4th rung to place the 4 circular shapes only.  Pt. worked on removing the shapes from the first rung with her left hand while in standing.  Pt. Reports being sad today missing her best friend the had passed away in a car crash while she was in the hospital recovering from her stroke. Pt. was able to make an appointment with counseling  services beginning in June. Pt. is planning to go to her aunt's house for the weekend.  Pt. Reports that the left hand splint she received in the hospital is hurting after 1 hour of wearing time at night. Pt. Was advised to discontinue wearing the splint, and bring it in for splint fit assessment. Pt. Continues to work on improving LUE strength, and [facility] skills in order to increase engagement of the LUE during ADLs, and IADLs.                           OT Education - 06/16/19 1500    Education Details  thumb movements, grasping patterns    Person(s) Educated  Patient    Methods  Explanation;Demonstration;Tactile cues;Verbal cues;Handout    Comprehension  Verbalized understanding;Returned demonstration;Need further instruction          OT Long Term Goals - 06/13/19 1753      OT LONG TERM GOAL #2   Title  Pt. will increase active shoulder abduction by 20 degrees in preparation to be able to wash her hair.    Baseline  Shoulder abduction: 108 Pt. is able to rinse her hair not wash it. 04/25/2019: shoulder abduction:95. Pt. is unable to use her LUE to wash her hair. Eval: Active shoulder abduction 56 degrees.    Time  12    Period  Weeks    Status  Partially Met    Target Date  08/29/19      OT LONG TERM GOAL #6   Title  Pt. will assume a mature tripod grasp on a pen while writing her name wiht 100% legibility.    Baseline  Pt. conitnues to be unable to assume a mature tripod grasp on a pen when drawing a line.    Time  12    Period  Weeks    Status  On-going    Target Date  08/29/19      OT LONG TERM GOAL #7   Title  Pt. will independently type a 3 sentence email message efficiently with 100% accuracy    Baseline  Pt. is unable.    Time  12    Period  Weeks    Status  On-going      OT LONG TERM GOAL #8   Title  Pt. will improve left hand [facility] skills in order to independentlymanipulate small objects needed during ADLs, and IADLs,    Baseline  Left hand [facility]  skills are limited.    Time  12    Period  Weeks    Status  On-going      OT LONG TERM GOAL  #9   TITLE  Pt. will use her left hand to independently handle, and use cooking, and baking utensils.  Baseline  Pt. conitnues to have difficulty    Time  12    Period  Weeks    Status  On-going    Target Date  08/29/19            Plan - 06/16/19 1503    Clinical Impression Statement  Pt. Reports being sad today missing her best friend the had passed away in a car crash while she was in the hospital recovering from her stroke. Pt. was able to make an appointment with counseling services beginning in June. Pt. is planning to go to her aunt's house for the weekend.  Pt. Reports that the left hand splint she received in the hospital is hurting after 1 hour of wearing time at night. Pt. Was advised to discontinue wearing the splint, and bring it in for splint fit assessment. Pt. Continues to work on improving LUE strength, and [facility] skills in order to increase engagement of the LUE during ADLs, and IADLs.   OT Occupational Profile and History  Detailed Assessment- Review of Records and additional review of physical, cognitive, psychosocial history related to current functional performance    Occupational performance deficits (Please refer to evaluation for details):  ADL's;IADL's    Body Structure / Function / Physical Skills  ADL;IADL;Coordination;Endurance;UE functional use;Decreased knowledge of precautions;Dexterity;FMC;ROM;Tone;Strength    Rehab Potential  Good    Clinical Decision Making  Several treatment options, min-mod task modification necessary    Comorbidities Affecting Occupational Performance:  May have comorbidities impacting occupational performance    Modification or Assistance to Complete Evaluation   Min-Moderate modification of tasks or assist with assess necessary to complete eval    OT Frequency  2x / week    OT Duration  12 weeks    OT Treatment/Interventions  Self-care/ADL  training;DME and/or AE instruction;Energy conservation;Therapeutic activities;Patient/family education;Therapeutic exercise;Neuromuscular education    Consulted and Agree with Plan of Care  Patient       Patient will benefit from skilled therapeutic intervention in order to improve the following deficits and impairments:   Body Structure / Function / Physical Skills: ADL, IADL, Coordination, Endurance, UE functional use, Decreased knowledge of precautions, Dexterity, FMC, ROM, Tone, Strength       Visit Diagnosis: Muscle weakness (generalized)  Other lack of coordination    Problem List Patient Active Problem List   Diagnosis Date Noted  . Postcoital UTI 05/24/2019  . Spastic hemiparesis ([place])   . Vascular headache   . Mood disorder in conditions classified elsewhere   . 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 Polysubstance dependence in early, early partial, sustained full, or sustained partial remission ([place])   . Dyslipidemia   . Ischemic stroke diagnosed during current admission ([place]) 02/13/2019  . MDD (major depressive disorder), recurrent episode, moderate ([place]) 02/10/2019  . CVA (cerebral vascular accident) ([place]) 02/07/2019  . TBI (traumatic brain injury) ([place]) 05/01/2016  . 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 Elevated troponin     Harrel Carina, MS, OTR/L 06/16/2019, 3:12 PM  [place] MAIN [facility] SERVICES [address] [place], [place], [address] Phone: [phone]   Fax:  [phone]  Name: Licet Dunphy MRN: [id number] Date of Birth: 1995-10-26

## 2019-06-16 NOTE — Therapy (Signed)
[place] MAIN [facility] SERVICES [address] [place], [place], [address] Phone: [phone]   Fax:  [phone]  Physical Therapy Treatment  Patient Details  Name: Arthea Nobel MRN: [id number] Date of Birth: 09/18/1995 Referring Provider (PT): Dr. Earnest Conroy. [name]   Encounter Date: 06/16/2019  PT End of Session - 06/16/19 1704    Visit Number  21    Number of Visits  24    Date for PT Re-Evaluation  08/17/19    Authorization Type  [employer], 60 visit limit per discipline    PT Start Time  1403    PT Stop Time  1430    PT Time Calculation (min)  27 min    Equipment Utilized During Treatment  Gait belt    Activity Tolerance  Patient tolerated treatment well;No increased pain    Behavior During Therapy  Flat affect       Past Medical History:  Diagnosis Date  . AKI (acute kidney injury) ([place]) 2018   "from overdose"  . Anxiety   . Daily headache   . Depression   . Drug overdose 04/2016   [name] 05/01/2016  . GERD (gastroesophageal reflux disease)    "when I was younger; gone now" (09/21/2017)  . IV drug abuse ([place]) 09/20/2017  . Migraine    "q couple weeks" (09/21/2017)  . Opioid abuse ([place])   . Overdose   . Stroke [facility])     Past Surgical History:  Procedure Laterality Date  . APPENDECTOMY  04/2013  . I & D EXTREMITY Left 09/20/2017   Procedure: IRRIGATION AND DEBRIDEMENT LEFT ARM;  Surgeon: [name], MD;  Location: [place];  Service: Orthopedics;  Laterality: Left;  . TEE WITHOUT CARDIOVERSION N/A 02/10/2019   Procedure: TRANSESOPHAGEAL ECHOCARDIOGRAM (TEE);  Surgeon: [name], MD;  Location: [facility];  Service: Cardiovascular;  Laterality: N/A;  Polysubstance abuser  . TONGUE SURGERY  ~ 2007   "related to lisp"  . TRACHEOSTOMY  04/2016   [name] 05/01/2016    There were no vitals filed for this visit.  Subjective Assessment - 06/16/19 1703    Subjective  Patient is late to session, forgot her AFO. Would like to work on improving  ankle strength and improving her walking ability    Patient is accompained by:  --   significant other   Pertinent History  Pt is 24 y.o. female presenting to hospital 02/07/19 initially,  MRI showing acute infarct R MCA territory affecting basal ganglia and affecting cortical and subcortical brain in a largely watershed distribution. After in hospital stay patient transitioned to CIR from 02/16/2019-03/01/2019.  PMH includes IV drug abuse, anxiety, depression, migraines, AKI, hypoxic ischemic encephalopathy 2018, opiate overdose 2018, h/o trach 2018, h/o I&D L UE. PLOF: lives with boyfriend in one story apartment, prior to CVA patient was independent, working.    Limitations  Walking;Reading;Lifting;Writing;House hold activities;Standing    How long can you sit comfortably?  NA    How long can you stand comfortably?  20 mins    How long can you walk comfortably?  20 mins    Diagnostic tests  see MRI results    Patient Stated Goals  Patient wants to maximize how much she can walk, get back to normal    Currently in Pain?  No/denies           Treatment: Supine Contract relax pressing into PT shoulder with LLE, stabilization provided to L foot for neutral alignment and pressing through flat foot  rather than toes 10x 2 sets  Bridges: arms crossed 10x Bridges, RLE straight 10x, more challenging   Seated AAROM: eversion to neutral 15x, improved ability to contract muscle with repetition AAROM: df with PT assistance 10x, with strap for home HEP 10x  Dorsiflexion overpressure for muscle tissue lengthening 60 seconds x 2 trials Pillow under R foot: sit to stand for weight acceptance onto LLE 10x  standing  single limb stance on LLE; BUE decreased to SUE support 30 seconds  Pt educated throughout session about proper posture and technique with exercises. Improved exercise technique, movement at target joints, use of target muscles after min to mod verbal, visual, tactile cues.        Patient  performed with instruction, verbal cues, tactile cues of therapist: goal: increase tissue extensibility, promote proper posture, improve mobility    Patient arrived late limiting session duration. Education on ankle righting and strengthening performed with patient demonstrating improved ability to contract proper musculature with repetition, at this time is still unable to perform actively against gravity but is improving in strength with assistance. Pt will benefit from PT services to address deficits in strength, balance, and mobility in order to return to full function at home                PT Education - 06/16/19 1704    Education provided  Yes    Education Details  ankle strengthening, weight acceptance    Person(s) Educated  Patient    Methods  Explanation;Demonstration;Tactile cues;Verbal cues    Comprehension  Verbalized understanding;Returned demonstration;Verbal cues required;Tactile cues required       PT Short Term Goals - 04/25/19 1627      PT SHORT TERM GOAL #1   Title  Pt will be independent with initial HEP in order to indicate improved strength and decreased fall risk.    Baseline  initial HEP administered on eval 03/02/2019; 04/25/19: "not as much as should."    Time  6    Period  Weeks    Status  Partially Met    Target Date  04/13/19        PT Long Term Goals - 06/13/19 1516      PT LONG TERM GOAL #1   Title  Pt will be independent with final HEP in order to indicate improved strength and decreased fall risk.    Baseline  initial HEP administered on eval 03/02/19; 04/25/19: "not as much as a should."    Time  12    Period  Weeks    Status  On-going    Target Date  08/17/19      PT LONG TERM GOAL #2   Title  Pt will decrease 5TSTS to less than 10 seconds in order to demonstrate clinically significant improvement in LE strength and functional mobility.    Baseline  03/02/2019: 17 seconds without UE support, dependent on RLE, very little weight  shift to LLE; 04/25/19: 7.7s, no UE, decreased weight shifting to LLE;    Time  12    Period  Weeks    Status  Achieved      PT LONG TERM GOAL #3   Title  Pt will perform 6MWT > 1034f with LRAD at mod I level in order to indicate safe community/leisure negotiation.    Baseline  03/02/2019 5377f 04/25/19: 1000'    Time  12    Period  Weeks    Status  Partially Met    Target Date  08/17/19      PT LONG TERM GOAL #4   Title  The patient will demonstrate at least 1/2 grade improvement in LE MMT grade to improve ability to perform functional activities.    Baseline  see eval for details 03/02/2019; 04/25/19: see visit note, significant improvement, still lacking L anikle dorsiflexion, eversion, or inversion    Time  12    Period  Weeks    Status  Partially Met    Target Date  08/17/19      PT LONG TERM GOAL #5   Title  Pt will ambulate >1 m/s with LRAD mod I to indicate gait velocity of community ambulator.    Baseline  03/02/19: self selected: .57 m/s, fastest .79ms with quad cane; 04/25/19: Self-selected: 10.8s = 0.93 m/s; Fastest: 8.8s = 1.14 m/s, performed with no AD    Time  12    Period  Weeks    Status  Partially Met    Target Date  08/17/19            Plan - 06/16/19 1705    Clinical Impression Statement  Patient arrived late limiting session duration. Education on ankle righting and strengthening performed with patient demonstrating improved ability to contract proper musculature with repetition, at this time is still unable to perform actively against gravity but is improving in strength with assistance. Pt will benefit from PT services to address deficits in strength, balance, and mobility in order to return to full function at home    Personal Factors and Comorbidities  Behavior Pattern    Examination-Activity Limitations  Bathing;Dressing;Sit;Transfers;Sleep;Bed Mobility;Bend;Caring for Others;Carry;Toileting;Reach Overhead;Stand;Locomotion  Level;Stairs;Squat;Lift;Hygiene/Grooming    Examination-Participation Restrictions  Church;Interpersonal Relationship;Personal Finances;Yard Work;Cleaning;Laundry;School;Community Activity;Medication Management;Driving;Meal Prep;Shop    Rehab Potential  Good    Clinical Impairments Affecting Rehab Potential  unsure of severity of cognitive/psych deficits    PT Frequency  2x / week    PT Duration  12 weeks    PT Treatment/Interventions  ADLs/Self Care Home Management;DME Instruction;Gait training;Stair training;Functional mobility training;Therapeutic activities;Therapeutic exercise;Balance training;Neuromuscular re-education;Cognitive remediation;Patient/family education;Vestibular;Energy conservation;Cryotherapy;Ultrasound;Electrical Stimulation;Fluidtherapy;Iontophoresis 429mml Dexamethasone;Canalith Repostioning;Moist Heat;Traction;Passive range of motion;Manual techniques;Joint Manipulations;Spinal Manipulations;Splinting;Taping;Scar mobilization;Wheelchair mobility training;Prosthetic Training;Orthotic Fit/Training;Dry needling;Visual/perceptual remediation/compensation;Aquatic Therapy    PT Next Visit Plan  continue with LE strengthening, NME, and motor control activities    PT Home Exercise Plan  Access Code: [email or web address]    Consulted and Agree with Plan of Care  Patient       Patient will benefit from skilled therapeutic intervention in order to improve the following deficits and impairments:  Decreased activity tolerance, Decreased balance, Decreased cognition, Decreased endurance, Decreased knowledge of use of DME, Decreased mobility, Decreased strength, Impaired perceived functional ability, Postural dysfunction, Abnormal gait, Difficulty walking, Impaired tone, Decreased range of motion, Decreased coordination, Impaired UE functional use, Pain  Visit Diagnosis: Muscle weakness (generalized)  Right middle cerebral artery stroke (H[facility])  Other abnormalities of gait and  mobility     Problem List Patient Active Problem List   Diagnosis Date Noted  . Postcoital UTI 05/24/2019  . Spastic hemiparesis ([facility]  . Vascular headache   . Mood disorder in conditions classified elsewhere   . Right middle cerebral artery stroke ([facility]01/07/2019  . Polysubstance dependence in early, early partial, sustained full, or sustained partial remission ([facility]  . Dyslipidemia   . Ischemic stroke diagnosed during current admission ([facility]01/04/2019  . MDD (major depressive disorder), recurrent episode, moderate ([facility]12/31/2020  . CVA (cerebral vascular accident) ([facility]12/28/2020  .  TBI (traumatic brain injury) ([place]) 05/01/2016  . 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 Elevated troponin    Janna Arch, PT, DPT   06/16/2019, 5:06 PM  [place] MAIN [facility] SERVICES [address] [place], [place], [address] Phone: [phone]   Fax:  [phone]  Name: Jaimie Pippins MRN: [id number] Date of Birth: 09-14-1995

## 2019-06-16 NOTE — Telephone Encounter (Signed)
I have provided refills for patient as needed; will assess what medications she needs at next appointment on 5/11

## 2019-06-17 ENCOUNTER — Telehealth (HOSPITAL_COMMUNITY): Payer: Self-pay | Admitting: Professional

## 2019-06-20 ENCOUNTER — Ambulatory Visit: Payer: 59 | Admitting: Physical Therapy

## 2019-06-20 ENCOUNTER — Other Ambulatory Visit: Payer: Self-pay

## 2019-06-20 ENCOUNTER — Other Ambulatory Visit (HOSPITAL_COMMUNITY): Payer: 59 | Attending: Psychiatry

## 2019-06-20 ENCOUNTER — Ambulatory Visit: Payer: 59 | Admitting: Occupational Therapy

## 2019-06-20 DIAGNOSIS — F332 Major depressive disorder, recurrent severe without psychotic features: Secondary | ICD-10-CM | POA: Insufficient documentation

## 2019-06-20 DIAGNOSIS — F1921 Other psychoactive substance dependence, in remission: Secondary | ICD-10-CM | POA: Insufficient documentation

## 2019-06-21 ENCOUNTER — Other Ambulatory Visit: Payer: Self-pay

## 2019-06-21 ENCOUNTER — Encounter: Payer: 59 | Admitting: Physical Medicine and Rehabilitation

## 2019-06-21 ENCOUNTER — Other Ambulatory Visit (HOSPITAL_COMMUNITY): Payer: 59 | Admitting: Licensed Clinical Social Worker

## 2019-06-21 DIAGNOSIS — F332 Major depressive disorder, recurrent severe without psychotic features: Secondary | ICD-10-CM | POA: Diagnosis not present

## 2019-06-21 DIAGNOSIS — F1921 Other psychoactive substance dependence, in remission: Secondary | ICD-10-CM

## 2019-06-23 ENCOUNTER — Ambulatory Visit: Payer: 59 | Admitting: Occupational Therapy

## 2019-06-23 ENCOUNTER — Ambulatory Visit: Payer: 59 | Admitting: Physical Therapy

## 2019-06-27 ENCOUNTER — Ambulatory Visit: Payer: 59 | Admitting: Occupational Therapy

## 2019-06-27 ENCOUNTER — Ambulatory Visit: Payer: 59 | Admitting: Physical Therapy
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
## 2019-06-28 ENCOUNTER — Other Ambulatory Visit: Payer: Self-pay | Admitting: Physical Medicine and Rehabilitation

## 2019-06-28 MED ORDER — QUETIAPINE FUMARATE 100 MG PO TABS: 100.0000 mg | ORAL_TABLET | Freq: Every day | ORAL | 3 refills | Status: DC

## 2019-06-28 MED ORDER — TIZANIDINE HCL 4 MG PO TABS: 4.0000 mg | ORAL_TABLET | Freq: Three times a day (TID) | ORAL | 1 refills | Status: DC

## 2019-06-30 ENCOUNTER — Ambulatory Visit: Payer: 59 | Admitting: Occupational Therapy

## 2019-06-30 ENCOUNTER — Ambulatory Visit: Payer: 59 | Admitting: Physical Therapy

## 2019-06-30 ENCOUNTER — Other Ambulatory Visit: Payer: Self-pay

## 2019-06-30 ENCOUNTER — Encounter: Payer: Self-pay | Admitting: Physical Therapy

## 2019-06-30 ENCOUNTER — Encounter: Payer: Self-pay | Admitting: Occupational Therapy

## 2019-06-30 DIAGNOSIS — M6281 Muscle weakness (generalized): Secondary | ICD-10-CM | POA: Diagnosis not present

## 2019-06-30 DIAGNOSIS — R41844 Frontal lobe and executive function deficit: Secondary | ICD-10-CM

## 2019-06-30 DIAGNOSIS — M25512 Pain in left shoulder: Secondary | ICD-10-CM

## 2019-06-30 DIAGNOSIS — I639 Cerebral infarction, unspecified: Secondary | ICD-10-CM

## 2019-06-30 DIAGNOSIS — R2689 Other abnormalities of gait and mobility: Secondary | ICD-10-CM

## 2019-06-30 DIAGNOSIS — I63511 Cerebral infarction due to unspecified occlusion or stenosis of right middle cerebral artery: Secondary | ICD-10-CM

## 2019-06-30 DIAGNOSIS — R278 Other lack of coordination: Secondary | ICD-10-CM

## 2019-06-30 NOTE — Therapy (Signed)
[place] MAIN [facility] SERVICES [address] [place], [place], [address] Phone: [phone]   Fax:  [phone]  Physical Therapy Treatment  Patient Details  Name: Jane Watkins MRN: [id number] Date of Birth: 08/16/1995 Referring Provider (PT): Dr. Earnest Conroy. [name]   Encounter Date: 06/30/2019  PT End of Session - 06/30/19 1436    Visit Number  22    Number of Visits  24    Date for PT Re-Evaluation  08/17/19    Authorization Type  [employer], 60 visit limit per discipline    PT Start Time  0272    PT Stop Time  1430    PT Time Calculation (min)  45 min    Equipment Utilized During Treatment  Gait belt    Activity Tolerance  Patient tolerated treatment well;No increased pain    Behavior During Therapy  Flat affect       Past Medical History:  Diagnosis Date  . AKI (acute kidney injury) ([place]) 2018   "from overdose"  . Anxiety   . Daily headache   . Depression   . Drug overdose 04/2016   [name] 05/01/2016  . GERD (gastroesophageal reflux disease)    "when I was younger; gone now" (09/21/2017)  . IV drug abuse ([place]) 09/20/2017  . Migraine    "q couple weeks" (09/21/2017)  . Opioid abuse ([place])   . Overdose   . Stroke [facility])     Past Surgical History:  Procedure Laterality Date  . APPENDECTOMY  04/2013  . I & D EXTREMITY Left 09/20/2017   Procedure: IRRIGATION AND DEBRIDEMENT LEFT ARM;  Surgeon: [name], MD;  Location: [place];  Service: Orthopedics;  Laterality: Left;  . TEE WITHOUT CARDIOVERSION N/A 02/10/2019   Procedure: TRANSESOPHAGEAL ECHOCARDIOGRAM (TEE);  Surgeon: [name], MD;  Location: [facility] ORS;  Service: Cardiovascular;  Laterality: N/A;  Polysubstance abuser  . TONGUE SURGERY  ~ 2007   "related to lisp"  . TRACHEOSTOMY  04/2016   [name] 05/01/2016    There were no vitals filed for this visit.  Subjective Assessment - 06/30/19 1402    Subjective  Patient arrives without AFO because she is having difficulty with  getting it on. She is having increased tone in L ankle. She is having pain in L groin.    Patient is accompained by:  --   significant other   Pertinent History  Pt is 24 y.o. female presenting to hospital 02/07/19 initially,  MRI showing acute infarct R MCA territory affecting basal ganglia and affecting cortical and subcortical brain in a largely watershed distribution. After in hospital stay patient transitioned to CIR from 02/16/2019-03/01/2019.  PMH includes IV drug abuse, anxiety, depression, migraines, AKI, hypoxic ischemic encephalopathy 2018, opiate overdose 2018, h/o trach 2018, h/o I&D L UE. PLOF: lives with boyfriend in one story apartment, prior to CVA patient was independent, working.    Limitations  Walking;Reading;Lifting;Writing;House hold activities;Standing    How long can you sit comfortably?  NA    How long can you stand comfortably?  20 mins    How long can you walk comfortably?  20 mins    Diagnostic tests  see MRI results    Patient Stated Goals  Patient wants to maximize how much she can walk, get back to normal    Currently in Pain?  Yes    Pain Score  5     Pain Location  Hip    Pain Orientation  Left    Pain  Descriptors / Indicators  Aching    Pain Onset  Today       Treatment: L ankle DF stretch and HC stretch x 30 sec x 5 L hip flex stretch prone over small towel roll x 30 sec x 3  L hip flexor STM to reduce groin pain x 30 sec x 3  Patient is having increased L LE tone to hip flex and ankle with poor gait quality Instructed in HEP with pictures taken on patients phone to remember the correct positions.                        PT Education - 06/30/19 1405    Education provided  Yes    Education Details  ankle stretcing    Person(s) Educated  Patient    Methods  Explanation;Demonstration;Tactile cues;Verbal cues;Other (comment)    Comprehension  Verbalized understanding;Need further instruction       PT Short Term Goals - 04/25/19  1627      PT SHORT TERM GOAL #1   Title  Pt will be independent with initial HEP in order to indicate improved strength and decreased fall risk.    Baseline  initial HEP administered on eval 03/02/2019; 04/25/19: "not as much as should."    Time  6    Period  Weeks    Status  Partially Met    Target Date  04/13/19        PT Long Term Goals - 06/13/19 1516      PT LONG TERM GOAL #1   Title  Pt will be independent with final HEP in order to indicate improved strength and decreased fall risk.    Baseline  initial HEP administered on eval 03/02/19; 04/25/19: "not as much as a should."    Time  12    Period  Weeks    Status  On-going    Target Date  08/17/19      PT LONG TERM GOAL #2   Title  Pt will decrease 5TSTS to less than 10 seconds in order to demonstrate clinically significant improvement in LE strength and functional mobility.    Baseline  03/02/2019: 17 seconds without UE support, dependent on RLE, very little weight shift to LLE; 04/25/19: 7.7s, no UE, decreased weight shifting to LLE;    Time  12    Period  Weeks    Status  Achieved      PT LONG TERM GOAL #3   Title  Pt will perform 6MWT > 1063f with LRAD at mod I level in order to indicate safe community/leisure negotiation.    Baseline  03/02/2019 539f 04/25/19: 1000'    Time  12    Period  Weeks    Status  Partially Met    Target Date  08/17/19      PT LONG TERM GOAL #4   Title  The patient will demonstrate at least 1/2 grade improvement in LE MMT grade to improve ability to perform functional activities.    Baseline  see eval for details 03/02/2019; 04/25/19: see visit note, significant improvement, still lacking L anikle dorsiflexion, eversion, or inversion    Time  12    Period  Weeks    Status  Partially Met    Target Date  08/17/19      PT LONG TERM GOAL #5   Title  Pt will ambulate >1 m/s with LRAD mod I to indicate gait velocity of community ambulator.  Baseline  03/02/19: self selected: .57 m/s, fastest  .59ms with quad cane; 04/25/19: Self-selected: 10.8s = 0.93 m/s; Fastest: 8.8s = 1.14 m/s, performed with no AD    Time  12    Period  Weeks    Status  Partially Met    Target Date  08/17/19            Plan - 06/30/19 1437    Clinical Impression Statement  Patient presents with increased tone to L hip and ankle and inability to wear L AFO due to it causing pain in L foot due to tone increases. Patient was instructed in stretching for caregiver with pictures on her phone. She performed PROM to left hip and left ankle for reducing her tome and manual therapy to left groin to decrease her hip flexor tone. She will continue to benefit from skilled PT to improve mobility.   Personal Factors and Comorbidities  Behavior Pattern    Examination-Activity Limitations  Bathing;Dressing;Sit;Transfers;Sleep;Bed Mobility;Bend;Caring for Others;Carry;Toileting;Reach Overhead;Stand;Locomotion Level;Stairs;Squat;Lift;Hygiene/Grooming    Examination-Participation Restrictions  Church;Interpersonal Relationship;Personal Finances;Yard Work;Cleaning;Laundry;School;Community Activity;Medication Management;Driving;Meal Prep;Shop    Rehab Potential  Good    Clinical Impairments Affecting Rehab Potential  unsure of severity of cognitive/psych deficits    PT Frequency  2x / week    PT Duration  12 weeks    PT Treatment/Interventions  ADLs/Self Care Home Management;DME Instruction;Gait training;Stair training;Functional mobility training;Therapeutic activities;Therapeutic exercise;Balance training;Neuromuscular re-education;Cognitive remediation;Patient/family education;Vestibular;Energy conservation;Cryotherapy;Ultrasound;Electrical Stimulation;Fluidtherapy;Iontophoresis 458mml Dexamethasone;Canalith Repostioning;Moist Heat;Traction;Passive range of motion;Manual techniques;Joint Manipulations;Spinal Manipulations;Splinting;Taping;Scar mobilization;Wheelchair mobility training;Prosthetic Training;Orthotic  Fit/Training;Dry needling;Visual/perceptual remediation/compensation;Aquatic Therapy    PT Next Visit Plan  continue with LE strengthening, NME, and motor control activities    PT Home Exercise Plan  Access Code: [email or web address]    Consulted and Agree with Plan of Care  Patient       Patient will benefit from skilled therapeutic intervention in order to improve the following deficits and impairments:  Decreased activity tolerance, Decreased balance, Decreased cognition, Decreased endurance, Decreased knowledge of use of DME, Decreased mobility, Decreased strength, Impaired perceived functional ability, Postural dysfunction, Abnormal gait, Difficulty walking, Impaired tone, Decreased range of motion, Decreased coordination, Impaired UE functional use, Pain  Visit Diagnosis: Muscle weakness (generalized)  Other lack of coordination  Right middle cerebral artery stroke (HCC)  Other abnormalities of gait and mobility  Acute pain of left shoulder  Ischemic stroke diagnosed during current admission ([facility] Frontal lobe and executive function deficit     Problem List Patient Active Problem List   Diagnosis Date Noted  . Major depressive disorder, recurrent episode, severe ([facility]CCamptonville05/12/2019  . Postcoital UTI 05/24/2019  . Spastic hemiparesis ([facility]CC[place]  . Vascular headache   . Mood disorder in conditions classified elsewhere   . Right middle cerebral artery stroke ([facility]CM[place]01/07/2019  . Polysubstance dependence in early, early partial, sustained full, or sustained partial remission ([facility]  . Dyslipidemia   . Ischemic stroke diagnosed during current admission (HCOmaha01/04/2019  . MDD (major depressive disorder), recurrent episode, moderate (HCWelcome12/31/2020  . CVA (cerebral vascular accident) (HCFres[place]12/28/2020  . TBI (traumatic brain injury) (HCSomers03/22/2018  . 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 Elevated troponin     MaAlanson PulsPTVirginiaPT 06/30/2019, 2:38 PM  CoTuppers PlainsAIN REBothwell Regional Health CenterERVICES [address] DrivedLake [id number]: [phone] Fax:  [phone]: LyVito BackersRN: [id number] of Birth: [birth year]

## 2019-06-30 NOTE — Therapy (Signed)
[facility] [address] [place], [place], [address] Phone: [phone]   Fax:  [phone]  Occupational Therapy Treatment  Patient Details  Name: Jane Watkins MRN: [id number] Date of Birth: 07/30/1995 Referring Provider (OT): Dr. Swartz   Encounter Date: 06/30/2019  OT End of Session - 06/30/19 1615    Visit Number  22    Number of Visits  24    Date for OT Re-Evaluation  08/29/19    Authorization Type  Progress report period starting 03/07/2019    OT Start Time  1430    OT Stop Time  1447    OT Time Calculation (min)  17 min    Activity Tolerance  Patient tolerated treatment well    Behavior During Therapy  Flat affect       Past Medical History:  Diagnosis Date  . AKI (acute kidney injury) (HCC) 2018   "from overdose"  . Anxiety   . Daily headache   . Depression   . Drug overdose 04/2016   /notes 05/01/2016  . GERD (gastroesophageal reflux disease)    "when I was younger; gone now" (09/21/2017)  . IV drug abuse (HCC) 09/20/2017  . Migraine    "q couple weeks" (09/21/2017)  . Opioid abuse (HCC)   . Overdose   . Stroke (HCC)     Past Surgical History:  Procedure Laterality Date  . APPENDECTOMY  04/2013  . I & D EXTREMITY Left 09/20/2017   Procedure: IRRIGATION AND DEBRIDEMENT LEFT ARM;  Surgeon: Ortmann, Fred, MD;  [facility]: [facility];  Service: Orthopedics;  Laterality: Left;  . TEE WITHOUT CARDIOVERSION N/A 02/10/2019   Procedure: TRANSESOPHAGEAL ECHOCARDIOGRAM (TEE);  Surgeon: Agbor-Etang, Brian, MD;  Location: [facility] ORS;  Service: Cardiovascular;  Laterality: N/A;  Polysubstance abuser  . TONGUE SURGERY  ~ 2007   "related to lisp"  . TRACHEOSTOMY  04/2016   /notes 05/01/2016    There were no vitals filed for this visit.  Subjective Assessment - 06/30/19 1612    Subjective   Pt. reports that she has moved in with her mother in [place].    Patient is accompanied by:  Family member    Pertinent History   Per chart. Pt. is a 23 y.o.female with a history of IV drug use, polysubstance as well as tobacco abuse, anxiety. Pt. presented to [facility] on 02/07/2019 after being found unresponsive.  EMS contacted patient received Narcan with some improvement in mental status.  Admission labs with WBC 20,700, potassium 5.2, BUN 25, creatinine 0.67, urine drug screen positive cocaine, marijuana and benzodiazepines, alcohol level less than 10, SARS coronavirus negative.  Cranial CT scan showed areas of indistinct low-density in the right basal ganglia with mass-effect on the right lateral ventricle.  Patient did not receive TPA.  MRI showed acute infarction of right MCA territory, affecting the basal ganglia and affecting the cortical and subcortical brain in a largely watershed distribution. Pt. had a previous inpatient rehab admission 05/01/2016 to 05/09/2016 for hypoxic encephalopathy after drug overdose requiring tracheostomy, and was later decannulated. Pt. resides with her boyfriend in an apartment. Pt. worked full time as a manager at [employer]. pt. enjoys travelling.    Patient Stated Goals  Patient reports she would like to be as independent as possible with all her daily tasks.    Currently in Pain?  Yes    Pain Score  --   Not rated   Pain Location  Finger (Comment which one)     3rd, and 4th digit  dorsal MP soreness   Pain Orientation  Left      OT TREATMENT     Therapeutic Exercise:  Pt. performed AROM for left shoulder flexion, abduction, horizontal abduction, adduction, elbow flexion, extension, forearm supination, wrist extension in sitting. Pt. worked on left wrist, and digit extension with her forearm supported at the tabletop.    Pt. Reports that she has moved in with her mother in [place], and has commuted from [place]. Pt. had a short OT session today secondary to having to leave early. Pt. presents with flexor tone limiting active left 3rd, and 5th digit MP extension. Pt. presents with soreness in her  3rd, and 4th digit MPs. Pt. was able to perform increased thumb radial, and palmar abduction. Pt. responds well to inhibitory techniques to her left hand to normalize tone, and prepare to facilitate active movement in order to increase engagement of her LUE during ADLS, and IADL tasks.                       OT Education - 06/30/19 1615    Education Details  ROM    Person(s) Educated  Patient    Methods  Explanation;Demonstration;Tactile cues;Verbal cues;Handout    Comprehension  Verbalized understanding;Returned demonstration;Need further instruction          OT Long Term Goals - 06/13/19 1753      OT LONG TERM GOAL #2   Title  Pt. will increase active shoulder abduction by 20 degrees in preparation to be able to wash her hair.    Baseline  Shoulder abduction: 108 Pt. is able to rinse her hair not wash it. 04/25/2019: shoulder abduction:95. Pt. is unable to use her LUE to wash her hair. Eval: Active shoulder abduction 56 degrees.    Time  12    Period  Weeks    Status  Partially Met    Target Date  08/29/19      OT LONG TERM GOAL #6   Title  Pt. will assume a mature tripod grasp on a pen while writing her name wiht 100% legibility.    Baseline  Pt. conitnues to be unable to assume a mature tripod grasp on a pen when drawing a line.    Time  12    Period  Weeks    Status  On-going    Target Date  08/29/19      OT LONG TERM GOAL #7   Title  Pt. will independently type a 3 sentence email message efficiently with 100% accuracy    Baseline  Pt. is unable.    Time  12    Period  Weeks    Status  On-going      OT LONG TERM GOAL #8   Title  Pt. will improve left hand FMC skills in order to independentlymanipulate small objects needed during ADLs, and IADLs,    Baseline  Left hand FMC skills are limited.    Time  12    Period  Weeks    Status  On-going      OT LONG TERM GOAL  #9   TITLE  Pt. will use her left hand to independently handle, and use cooking, and  baking utensils.    Baseline  Pt. conitnues to have difficulty    Time  12    Period  Weeks    Status  On-going    Target Date  08/29/19              Plan - 06/30/19 1615    Clinical Impression Statement  Pt. Reports that she has moved in with her mother in [place], and has commuted from [place]. Pt. had a short OT session today secondary to having to leave early. Pt. presents with flexor tone limiting active left 3rd, and 5th digit MP extension. Pt. presents with soreness in her 3rd, and 4th digit MPs. Pt. was able to perform increased thumb radial, and palmar abduction. Pt. responds well to inhibitory techniques to her left hand to normalize tone, and prepare to facilitate active movement in order to increase engagement of her LUE during ADLS, and IADL tasks.   OT Occupational Profile and History  Detailed Assessment- Review of Records and additional review of physical, cognitive, psychosocial history related to current functional performance    Occupational performance deficits (Please refer to evaluation for details):  ADL's;IADL's    Body Structure / Function / Physical Skills  ADL;IADL;Coordination;Endurance;UE functional use;Decreased knowledge of precautions;Dexterity;FMC;ROM;Tone;Strength    Rehab Potential  Good    Clinical Decision Making  Several treatment options, min-mod task modification necessary    Comorbidities Affecting Occupational Performance:  May have comorbidities impacting occupational performance    Modification or Assistance to Complete Evaluation   Min-Moderate modification of tasks or assist with assess necessary to complete eval    OT Frequency  2x / week    OT Duration  12 weeks    OT Treatment/Interventions  Self-care/ADL training;DME and/or AE instruction;Energy conservation;Therapeutic activities;Patient/family education;Therapeutic exercise;Neuromuscular education    Consulted and Agree with Plan of Care  Patient       Patient will benefit from skilled  therapeutic intervention in order to improve the following deficits and impairments:   Body Structure / Function / Physical Skills: ADL, IADL, Coordination, Endurance, UE functional use, Decreased knowledge of precautions, Dexterity, FMC, ROM, Tone, Strength       Visit Diagnosis: Muscle weakness (generalized)  Other lack of coordination    Problem List Patient Active Problem List   Diagnosis Date Noted  . Major depressive disorder, recurrent episode, severe ([place]) 06/21/2019  . Postcoital UTI 05/24/2019  . Spastic hemiparesis ([place])   . Vascular headache   . Mood disorder in conditions classified elsewhere   . Right middle cerebral artery stroke ([place]) 02/16/2019  . Polysubstance dependence in early, early partial, sustained full, or sustained partial remission ([place])   . Dyslipidemia   . Ischemic stroke diagnosed during current admission ([place]) 02/13/2019  . MDD (major depressive disorder), recurrent episode, moderate ([place]) 02/10/2019  . CVA (cerebral vascular accident) ([place]) 02/07/2019  . TBI (traumatic brain injury) ([place]) 05/01/2016  . 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 Elevated troponin     Harrel Carina, MS, OTR/L 06/30/2019, 4:17 PM  [place] MAIN [facility] SERVICES [address] [place], [place], [address] Phone: [phone]   Fax:  [phone]  Name: Jane Watkins MRN: [id number] Date of Birth: 1995-03-26

## 2019-07-04 ENCOUNTER — Ambulatory Visit: Payer: 59 | Admitting: Physical Therapy

## 2019-07-04 ENCOUNTER — Ambulatory Visit: Payer: 59 | Admitting: Occupational Therapy

## 2019-07-05 ENCOUNTER — Encounter: Payer: 59 | Attending: Physical Medicine and Rehabilitation | Admitting: Physical Medicine and Rehabilitation

## 2019-07-05 ENCOUNTER — Encounter: Payer: Self-pay | Admitting: Physical Medicine and Rehabilitation

## 2019-07-05 ENCOUNTER — Other Ambulatory Visit: Payer: Self-pay

## 2019-07-05 ENCOUNTER — Telehealth (HOSPITAL_COMMUNITY): Payer: 59 | Admitting: Professional

## 2019-07-05 VITALS — BP 108/70 | HR 65 | Temp 97.5°F | Ht 62.5 in | Wt 114.6 lb

## 2019-07-05 DIAGNOSIS — G4701 Insomnia due to medical condition: Secondary | ICD-10-CM | POA: Diagnosis not present

## 2019-07-05 DIAGNOSIS — F411 Generalized anxiety disorder: Secondary | ICD-10-CM | POA: Insufficient documentation

## 2019-07-05 DIAGNOSIS — I6330 Cerebral infarction due to thrombosis of unspecified cerebral artery: Secondary | ICD-10-CM | POA: Diagnosis not present

## 2019-07-05 DIAGNOSIS — G8110 Spastic hemiplegia affecting unspecified side: Secondary | ICD-10-CM

## 2019-07-05 DIAGNOSIS — F331 Major depressive disorder, recurrent, moderate: Secondary | ICD-10-CM | POA: Diagnosis not present

## 2019-07-05 DIAGNOSIS — G44221 Chronic tension-type headache, intractable: Secondary | ICD-10-CM | POA: Diagnosis present

## 2019-07-05 DIAGNOSIS — I63511 Cerebral infarction due to unspecified occlusion or stenosis of right middle cerebral artery: Secondary | ICD-10-CM | POA: Insufficient documentation

## 2019-07-05 DIAGNOSIS — Z930 Tracheostomy status: Secondary | ICD-10-CM | POA: Insufficient documentation

## 2019-07-05 DIAGNOSIS — F5102 Adjustment insomnia: Secondary | ICD-10-CM | POA: Insufficient documentation

## 2019-07-05 DIAGNOSIS — N3000 Acute cystitis without hematuria: Secondary | ICD-10-CM | POA: Diagnosis not present

## 2019-07-05 MED ORDER — FLUOXETINE HCL 40 MG PO CAPS: 40.0000 mg | ORAL_CAPSULE | Freq: Every day | ORAL | 3 refills | Status: DC

## 2019-07-05 MED ORDER — QUETIAPINE FUMARATE 50 MG PO TABS: 50.0000 mg | ORAL_TABLET | Freq: Every day | ORAL | 1 refills | Status: DC

## 2019-07-05 MED ORDER — CLOPIDOGREL BISULFATE 75 MG PO TABS: 75.0000 mg | ORAL_TABLET | Freq: Every day | ORAL | 0 refills | Status: DC

## 2019-07-05 MED ORDER — BACLOFEN 5 MG PO TABS: 1.0000 | ORAL_TABLET | Freq: Three times a day (TID) | ORAL | 3 refills | Status: DC | PRN

## 2019-07-05 NOTE — Progress Notes (Signed)
Jane Noe, MD   Chief Complaint  Patient presents with  . Contraception    interested in IUD    HPI:      Jane Watkins is a 24 y.o. No obstetric history on file. whose LMP was Patient's last menstrual period was 07/03/2019 (exact date)., presents today for NP [facility] consult, referred by PCP. Pt would like paragard IUD. Menses are monthly, lasting 3-5 days, no BTB, vaginal pain with menses, improved with tylenol. Hx of CVA 12/20. She is sex active, no pain/bleeding. Sometimes using condoms. No recent STD testing; hx of chlamydia a few yrs ago. Smokes 1 ppd, has Rx chantix and nicotine patches to try to quit.    Past Medical History:  Diagnosis Date  . AKI (acute kidney injury) ([place]) 2018   "from overdose"  . Anxiety   . Chlamydia   . Daily headache   . Depression   . Drug overdose 04/2016   Jane Watkins 05/01/2016  . GERD (gastroesophageal reflux disease)    "when I was younger; gone now" (09/21/2017)  . IV drug abuse ([place]) 09/20/2017  . Migraine    "q couple weeks" (09/21/2017)  . Opioid abuse ([place])   . Overdose   . Stroke [facility])     Past Surgical History:  Procedure Laterality Date  . APPENDECTOMY  04/2013  . I & D EXTREMITY Left 09/20/2017   Procedure: IRRIGATION AND DEBRIDEMENT LEFT ARM;  Surgeon: [name], MD;  Location: [facility];  Service: Orthopedics;  Laterality: Left;  . TEE WITHOUT CARDIOVERSION N/A 02/10/2019   Procedure: TRANSESOPHAGEAL ECHOCARDIOGRAM (TEE);  Surgeon: [name], MD;  Location: [facility] ORS;  Service: Cardiovascular;  Laterality: N/A;  Polysubstance abuser  . TONGUE SURGERY  ~ 2007   "related to lisp"  . TRACHEOSTOMY  04/2016   Jane Watkins 05/01/2016    Family History  Problem Relation Age of Onset  . Anemia Father   . Healthy Sister   . Thyroid cancer Maternal Grandmother   . Lung cancer Maternal Grandfather        metastasized  . Lung cancer Paternal Grandfather     Social History   Socioeconomic History  . Marital status:  Significant Other    Spouse name: Not on file  . Number of children: Not on file  . Years of education: high school  . Highest education level: Not on file  Occupational History  . Not on file  Tobacco Use  . Smoking status: Current Every Day Smoker    Packs/day: 0.50    Years: 10.00    Pack years: 5.00    Types: Cigarettes  . Smokeless tobacco: Never Used  Substance and Sexual Activity  . Alcohol use: No  . Drug use: Not Currently    Types: IV, Heroin, Cocaine, Marijuana    Comment: last use of heroin and cocaine was 01/2019, marijuana daily  . Sexual activity: Yes    Birth control/protection: Condom    Comment: occasionally   Other Topics Concern  . Not on file  Social History Narrative   05/24/19   From: [name], [place]   Living: with boyfriend [name]) -- former husband died from overdose   Work: not currently, in the application process for disability      Family: good relationship with dad who lives in [place], mom is in [place]. [place] relationship with bother and sister      Enjoys: training her puppy (corndog)      Exercise: walking, exercise bike in the  apartment   Diet: not the best, tries to cook at home, fruit/veggies      Safety   Seat belts: Yes    Guns: No   Safe in relationships: Yes    Social Determinants of Health   Financial Resource Strain:   . Difficulty of Paying Living Expenses:   Food Insecurity:   . Worried About Charity fundraiser in the Last Year:   . Arboriculturist in the Last Year:   Transportation Needs:   . Film/video editor (Medical):   Marland Kitchen Lack of Transportation (Non-Medical):   Physical Activity:   . Days of Exercise per Week:   . Minutes of Exercise per Session:   Stress:   . Feeling of Stress :   Social Connections:   . Frequency of Communication with Friends and Family:   . Frequency of Social Gatherings with Friends and Family:   . Attends Religious Services:   . Active Member of Clubs or Organizations:   . Attends Theatre manager Meetings:   Marland Kitchen Marital Status:   Intimate Partner Violence:   . Fear of Current or Ex-Partner:   . Emotionally Abused:   Marland Kitchen Physically Abused:   . Sexually Abused:     Outpatient Medications Prior to Visit  Medication Sig Dispense Refill  . acetaminophen (TYLENOL) 325 MG tablet Take 2 tablets (650 mg total) by mouth every 4 (four) hours as needed for mild pain (or temp > 37.5 C (99.5 F)).    Marland Kitchen atorvastatin (LIPITOR) 20 MG tablet Take 1 tablet (20 mg total) by mouth daily at 6 PM. 90 tablet 1  . Baclofen 5 MG TABS Take 1 tablet by mouth 3 (three) times daily as needed. 90 tablet 3  . benzoyl peroxide 5 % external liquid Apply topically 2 (two) times daily.    . busPIRone (BUSPAR) 5 MG tablet Take 1 tablet (5 mg total) by mouth 3 (three) times daily. 60 tablet 1  . clindamycin-benzoyl peroxide (BENZACLIN) gel As directed    . clopidogrel (PLAVIX) 75 MG tablet Take 1 tablet (75 mg total) by mouth daily. 30 tablet 0  . doxycycline (VIBRA-TABS) 100 MG tablet Take 100 mg by mouth 2 (two) times daily.    Marland Kitchen FLUoxetine (PROZAC) 40 MG capsule Take 1 capsule (40 mg total) by mouth daily. 30 capsule 3  . NARCAN 4 MG/0.1ML LIQD nasal spray kit 1 spray once.    . nitrofurantoin, macrocrystal-monohydrate, (MACROBID) 100 MG capsule Take 1 capsule (100 mg total) by mouth daily as needed (within 2 hours after intercourse). 30 capsule 1  . pantoprazole (PROTONIX) 40 MG tablet Take 1 tablet (40 mg total) by mouth daily. 30 tablet 0  . QUEtiapine (SEROQUEL) 50 MG tablet Take 1 tablet (50 mg total) by mouth at bedtime. 90 tablet 1  . tiZANidine (ZANAFLEX) 4 MG tablet Take 1 tablet (4 mg total) by mouth 3 (three) times daily. 90 tablet 1  . topiramate (TOPAMAX) 50 MG tablet TAKE 2 TABLETS (100 MG TOTAL) BY MOUTH 2 (TWO) TIMES DAILY. 360 tablet 0  . tretinoin (RETIN-A) 0.05 % cream As directed     Facility-Administered Medications Prior to Visit  Medication Dose Route Frequency Provider Last Rate  Last Admin  . busPIRone (BUSPAR) tablet 5 mg  5 mg Oral BID Raulkar, Clide Deutscher, MD          ROS:  Review of Systems  Constitutional: Negative for fever.  Gastrointestinal: Negative for blood in  stool, constipation, diarrhea, nausea and vomiting.  Genitourinary: Negative for dyspareunia, dysuria, flank pain, frequency, hematuria, urgency, vaginal bleeding, vaginal discharge and vaginal pain.  Musculoskeletal: Negative for back pain.  Skin: Negative for rash.   BREAST: No symptoms   OBJECTIVE:   Vitals:  BP 90/60   Ht 5' 2.5" (1.588 m)   Wt 116 lb (52.6 kg)   LMP 07/03/2019 (Exact Date)   BMI 20.88 kg/m   Physical Exam Vitals reviewed.  Constitutional:      Appearance: She is well-developed.  Pulmonary:     Effort: Pulmonary effort is normal.  Genitourinary:    General: Normal vulva.     Pubic Area: No rash.      Labia:        Right: No rash, tenderness or lesion.        Left: No rash, tenderness or lesion.      Vagina: Normal. No vaginal discharge, erythema or tenderness.     Cervix: Normal.  Musculoskeletal:        General: Normal range of motion.     Cervical back: Normal range of motion.  Skin:    General: Skin is warm and dry.  Neurological:     General: No focal deficit present.     Mental Status: She is alert and oriented to person, place, and time.  Psychiatric:        Mood and Affect: Mood normal.        Behavior: Behavior normal.        Thought Content: Thought content normal.        Judgment: Judgment normal.    IUD Insertion Procedure Note Patient identified, informed consent performed, consent signed.   Discussed risks of irregular bleeding, cramping, infection, malpositioning or misplacement of the IUD outside the uterus which may require further procedure such as laparoscopy, risk of failure <1%. Time out was performed.    Speculum placed in the vagina.  Cervix visualized.  Cleaned with Betadine x 2.  Grasped anteriorly with a single tooth  tenaculum.  Uterus sounded to 7.0 cm.   IUD placed per manufacturer's recommendations.  Strings trimmed to 3 cm. Tenaculum was removed, good hemostasis noted.  Patient tolerated procedure well.   Assessment/Plan: Encounter for initial prescription of intrauterine contraceptive device (IUD); Paragard pros/cons/risks/benefits discussed. Pt would like to proceed with placement today since on menses.  Encounter for IUD insertion - Plan: paragard intrauterine copper IUD  Screening for STD (sexually transmitted disease) - Plan: Cervicovaginal ancillary only   Meds ordered this encounter  Medications  . paragard intrauterine copper IUD   Call if you are having increasing pain, cramps or bleeding or if you have a fever greater than 100.4 degrees F., shaking chills, nausea or vomiting. Patient was also asked to check IUD strings periodically and follow up in 4 weeks for IUD check.    Return in about 4 weeks (around 08/03/2019) for IUD f/u.  Bernadette Gores B. Jamilynn Whitacre, PA-C 07/06/2019 11:04 AM

## 2019-07-05 NOTE — Progress Notes (Signed)
Subjective:    Patient ID: Jane Watkins, female    DOB: 1995/09/26, 24 y.o.   MRN: [id number]  HPI   Last visit I administered 100U of Botox as follows:  Left soleus: 30U Left gastrocnemius: 80D Left flexor pollicis longus: 98P Left adductor pollicis: 38S.   She did not feel benefit initially but now feels an increase in painful tone and wonders whether this may be a result of the Botox wearing off. Her tone is worst in her right ankle, then in the curling of her right toes, then in her right hand and elbow. She asks if there is anything I can give her to help the pain. She has been taking Tizanidine 4mg  TID for spasticity and it does not make her sleepy.  She reports continued depression since her husband's death from overdose in 20-May-2022 and after her stroke.   She has an appointment with OB/GYN tomorrow for IUD placement.   She requires a renewal of her Plavix and asks for how long she should take this.   She also asks for a renewal of her Prozac and Seroquel.    Pain Inventory Average Pain 5 Pain Right Now 3 My pain is intermittent and aching  In the last 24 hours, has pain interfered with the following? General activity 0 Relation with others 0 Enjoyment of life 0 What TIME of day is your pain at its worst? daytime Sleep (in general) Fair  Pain is worse with: some activites Pain improves with: rest Relief from Meds: 2  Mobility walk without assistance  Function disabled: date disabled . I need assistance with the following:  dressing, bathing and meal prep  Neuro/Psych depression anxiety  Prior Studies Any changes since last visit?  no  Physicians involved in your care Any changes since last visit?  no   Family History  Problem Relation Age of Onset  . Anemia Father   . Healthy Sister   . Thyroid cancer Maternal Grandmother   . Lung cancer Maternal Grandfather        metastasized  . Lung cancer Paternal Grandfather    Social History    Socioeconomic History  . Marital status: Significant Other    Spouse name: Not on file  . Number of children: Not on file  . Years of education: high school  . Highest education level: Not on file  Occupational History  . Not on file  Tobacco Use  . Smoking status: Current Every Day Smoker    Packs/day: 0.50    Years: 10.00    Pack years: 5.00    Types: Cigarettes  . Smokeless tobacco: Never Used  Substance and Sexual Activity  . Alcohol use: No  . Drug use: Not Currently    Types: IV, Heroin, Cocaine, Marijuana    Comment: last use of heroin and cocaine was 01/2019, marijuana daily  . Sexual activity: Yes    Birth control/protection: Condom    Comment: occasionally   Other Topics Concern  . Not on file  Social History Narrative   05/24/19   From: [name], [place]   Living: with boyfriend [name]) -- former husband died from overdose   Work: not currently, in the application process for disability      Family: good relationship with dad who lives in [place], mom is in [place]. [place] relationship with bother and sister      Enjoys: training her puppy (corndog)      Exercise: walking, exercise bike in the apartment  Diet: not the best, tries to cook at home, fruit/veggies      Safety   Seat belts: Yes    Guns: No   Safe in relationships: Yes    Social Determinants of Health   Financial Resource Strain:   . Difficulty of Paying Living Expenses:   Food Insecurity:   . Worried About Programme researcher, broadcasting/film/video in the Last Year:   . Barista in the Last Year:   Transportation Needs:   . Freight forwarder (Medical):   Marland Kitchen Lack of Transportation (Non-Medical):   Physical Activity:   . Days of Exercise per Week:   . Minutes of Exercise per Session:   Stress:   . Feeling of Stress :   Social Connections:   . Frequency of Communication with Friends and Family:   . Frequency of Social Gatherings with Friends and Family:   . Attends Religious Services:   . Active Member  of Clubs or Organizations:   . Attends Banker Meetings:   Marland Kitchen Marital Status:    Past Surgical History:  Procedure Laterality Date  . APPENDECTOMY  04/2013  . I & D EXTREMITY Left 09/20/2017   Procedure: IRRIGATION AND DEBRIDEMENT LEFT ARM;  Surgeon: Bradly Bienenstock, MD;  [facility];  Service: Orthopedics;  Laterality: Left;  . TEE WITHOUT CARDIOVERSION N/A 02/10/2019   Procedure: TRANSESOPHAGEAL ECHOCARDIOGRAM (TEE);  Surgeon: Debbe Odea, MD;  Location: [facility] ORS;  Service: Cardiovascular;  Laterality: N/A;  Polysubstance abuser  . TONGUE SURGERY  ~ 2007   "related to lisp"  . TRACHEOSTOMY  04/2016   [name] 05/01/2016   Past Medical History:  Diagnosis Date  . AKI (acute kidney injury) (HCC) 2018   "from overdose"  . Anxiety   . Daily headache   . Depression   . Drug overdose 04/2016   [name] 05/01/2016  . GERD (gastroesophageal reflux disease)    "when I was younger; gone now" (09/21/2017)  . IV drug abuse (HCC) 09/20/2017  . Migraine    "q couple weeks" (09/21/2017)  . Opioid abuse (HCC)   . Overdose   . Stroke (HCC)    BP 108/70   Pulse 65   Temp (!) 97.5 F (36.4 C)   Ht 5' 2.5" (1.588 m)   Wt 114 lb 9.6 oz (52 kg)   SpO2 99%   BMI 20.63 kg/m   Opioid Risk Score:   Fall Risk Score:  `1  Depression screen PHQ 2/9  Depression screen [facility] 2/9 07/05/2019 05/24/2019  Decreased Interest 3 3  Down, Depressed, Hopeless - 3  PHQ - 2 Score 3 6  Altered sleeping - 3  Tired, decreased energy - 3  Change in appetite - 2  Feeling bad or failure about yourself  - 2  Trouble concentrating - 2  Moving slowly or fidgety/restless - 0  Suicidal thoughts - 3  PHQ-9 Score - 21  Difficult doing work/chores - Somewhat difficult   Review of Systems  Constitutional: Negative.   HENT: Negative.   Eyes: Negative.   Respiratory: Negative.   Cardiovascular: Negative.   Gastrointestinal: Negative.   Endocrine: Negative.   Genitourinary: Negative.    Musculoskeletal: Negative.   Skin: Negative.   Allergic/Immunologic: Negative.   Neurological: Negative.   Hematological: Bruises/bleeds easily.       Plavix  Psychiatric/Behavioral: Positive for dysphoric mood. The patient is nervous/anxious.   All other systems reviewed and are negative.      Objective:  Physical Exam Constitutional: No distress . Vital signs reviewed. HEENT: EOMI, oral membranes moist Neck: supple Cardiovascular: RRR without murmur. No JVD    Respiratory: CTA Bilaterally without wheezes or rales. Normal effort    GI: BS +, non-tender, non-distended  Skin: Warm and dry.  Intact. Psych: Pleasant.  Musc: No edema in extremities.  No tenderness in extremities. Neurological: Alert Motor:  LUE: 4/5 throughout.  LLE: HF, KE 5/5, 1/5 ADF  MAS: Right side 2 in PF, 2 in toe flexors 3rd through 5th digits, 1+ in finger flexors and 1+ in elbow flexors.     Assessment & Plan:  1.  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 Advised that she perform HEP daily on days she does not have therapy.             Mobility in LUE is much improved!             She has been able to walk 20-30 min daily.    2. Pain Management: Headaches, now well controlled             Continue Topamax to 75 mg twice daily             Currently taking Tizanidine 4mg  TID. Added Baclofen 5mg  TID for increased and painful tone. Monitor for oversedation and hypotension.  3. Insomnia             Increased Seroquel to 150mg  given continued difficulty sleeping at night.    4. Depression:              -Continue prozac 40mg  for depression   5.  Polysubstance abuse as well as tobacco abuse.  No longer requiring Chantix or Nicotine patches.    6.  Hyperlipidemia.   Continue Lipitor   7. Anxiety: Continue Buspar 5mg  BID.    8. Completed UTI treatment.     9. Spasticity of left flexor pollicis longus, left soleus and gastrocnemius, left toe flexors. RTC in 2 months for 200U Botox as follows:  Left soleus: 75U in 3 sites Left gastrocnemius: 75U in 3 sites Left flexor pollicis longus: 20U Left adductor pollicis: 10U.  Flexor digitorus longus: 30U   10. Dental work: Patient should be fine receiving Lidocaine numbing medication for dental work. Reviewed her Echo which was within normal limits.   11. Provided return to driving guidelines for when she returns to driving in the future.    RETURN TO DRIVING PLAN:   WITH THE SUPERVISION OF A LICENSED DRIVER, PLEASE DRIVE IN AN EMPTY PARKING LOT FOR AT LEAST 2-3 TRIALS TO TEST REACTION TIME, VISION, USE OF EQUIPMENT IN CAR, ETC.   IF SUCCESSFUL WITH THE PARKING LOT DRIVING, PROCEED TO SUPERVISED DRIVING TRIALS IN YOUR NEIGHBORHOOD STREETS AT LOW TRAFFIC TIMES TO TEST OBSERVATION TO TRAFFIC SIGNALS, REACTION TIME, ETC. PLEASE ATTEMPT AT LEAST 2-3 TRIALS IN YOUR NEIGHBORHOOD.   IF NEIGHBORHOOD DRIVING IS SUCCESSFUL, YOU MAY PROCEED TO DRIVING IN BUSIER AREAS IN YOUR COMMUNITY WITH SUPERVISION OF A LICENSED DRIVER. PLEASE ATTEMPT AT LEAST 4-5 TRIALS.   IF COMMUNITY DRIVING IS SUCCESSFUL, YOU MAY PROCEED TO DRIVING ALONE, DURING THE DAY TIME, IN NON-PEAK TRAFFIC TIMES.  YOU SHOULD DRIVE NO FURTHER THAN 10 MINUTES IN ONE DIRECTION. PLEASE DO NOT DRIVE IF YOU FEEL FATIGUED OR UNDER THE INFLUENCE OF MEDICATION.   Boyfriend [name] is currently still driving.  12. Contraception: Has appointment tomorrow for IUD placement. Advised that oral contraceptives are contraindicated given increased risk for stroke.   All questions answered. RTC in 2 months.

## 2019-07-06 ENCOUNTER — Ambulatory Visit (INDEPENDENT_AMBULATORY_CARE_PROVIDER_SITE_OTHER): Payer: 59 | Admitting: Obstetrics and Gynecology

## 2019-07-06 ENCOUNTER — Encounter: Payer: Self-pay | Admitting: Obstetrics and Gynecology

## 2019-07-06 ENCOUNTER — Other Ambulatory Visit (HOSPITAL_COMMUNITY)
Admission: RE | Admit: 2019-07-06 | Discharge: 2019-07-06 | Disposition: A | Discharge status: Home / Self Care | Payer: 59 | Source: Ambulatory Visit | Attending: Obstetrics and Gynecology | Admitting: Obstetrics and Gynecology

## 2019-07-06 VITALS — BP 90/60 | Ht 62.5 in | Wt 116.0 lb

## 2019-07-06 DIAGNOSIS — Z113 Encounter for screening for infections with a predominantly sexual mode of transmission: Secondary | ICD-10-CM | POA: Insufficient documentation

## 2019-07-06 DIAGNOSIS — Z30014 Encounter for initial prescription of intrauterine contraceptive device: Secondary | ICD-10-CM

## 2019-07-06 DIAGNOSIS — Z30430 Encounter for insertion of intrauterine contraceptive device: Secondary | ICD-10-CM | POA: Diagnosis not present

## 2019-07-06 MED ORDER — PARAGARD INTRAUTERINE COPPER IU IUD: INTRAUTERINE_SYSTEM | Freq: Once | INTRAUTERINE | Status: DC

## 2019-07-06 NOTE — Patient Instructions (Signed)
I value your feedback and entrusting us with your care. If you get a [facility] patient survey, I would appreciate you taking the time to let us know about your experience today. Thank you!  As of January 20, 2019, your lab results will be released to your MyChart immediately, before I even have a chance to see them. Please give me time to review them and contact you if there are any abnormalities. Thank you for your patience.  

## 2019-07-07 ENCOUNTER — Other Ambulatory Visit: Payer: Self-pay

## 2019-07-07 ENCOUNTER — Encounter: Payer: Self-pay | Admitting: Occupational Therapy

## 2019-07-07 ENCOUNTER — Ambulatory Visit: Payer: 59 | Admitting: Physical Therapy

## 2019-07-07 ENCOUNTER — Ambulatory Visit: Payer: 59 | Admitting: Occupational Therapy

## 2019-07-07 DIAGNOSIS — R278 Other lack of coordination: Secondary | ICD-10-CM

## 2019-07-07 DIAGNOSIS — M6281 Muscle weakness (generalized): Secondary | ICD-10-CM | POA: Diagnosis not present

## 2019-07-07 LAB — CERVICOVAGINAL ANCILLARY ONLY
Chlamydia: NEGATIVE
Comment: NEGATIVE
Comment: NORMAL
Neisseria Gonorrhea: NEGATIVE

## 2019-07-07 NOTE — Therapy (Signed)
[place] MAIN [facility] SERVICES [address] [place], [place], [address] Phone: [phone]   Fax:  [phone]  Occupational Therapy Treatment  Patient Details  Name: Jane Watkins MRN: [id number] Date of Birth: [birth year] Referring Provider (OT): Dr. [name]   Encounter Date: 07/07/2019  OT End of Session - 07/07/19 1456    Visit Number  23    Number of Visits  24    Date for OT Re-Evaluation  08/29/19    Authorization Type  Progress report period starting 03/07/2019    OT Start Time  1445    OT Stop Time  1530    OT Time Calculation (min)  45 min    Activity Tolerance  Patient tolerated treatment well    Behavior During Therapy  Flat affect       Past Medical History:  Diagnosis Date  . AKI (acute kidney injury) ([place]) 2018   "from overdose"  . Anxiety   . Chlamydia   . Daily headache   . Depression   . Drug overdose 04/2016   [name] 05/01/2016  . GERD (gastroesophageal reflux disease)    "when I was younger; gone now" (09/21/2017)  . IV drug abuse ([place]) 09/20/2017  . Migraine    "q couple weeks" (09/21/2017)  . Opioid abuse ([place])   . Overdose   . Stroke [facility])     Past Surgical History:  Procedure Laterality Date  . APPENDECTOMY  04/2013  . I & D EXTREMITY Left 09/20/2017   Procedure: IRRIGATION AND DEBRIDEMENT LEFT ARM;  Surgeon: [name], MD;  Location: [facility];  Service: Orthopedics;  Laterality: Left;  . TEE WITHOUT CARDIOVERSION N/A 02/10/2019   Procedure: TRANSESOPHAGEAL ECHOCARDIOGRAM (TEE);  Surgeon: [name], MD;  Location: [facility] ORS;  Service: Cardiovascular;  Laterality: N/A;  Polysubstance abuser  . TONGUE SURGERY  ~ 2007   "related to lisp"  . TRACHEOSTOMY  04/2016   [name] 05/01/2016    There were no vitals filed for this visit.  Subjective Assessment - 07/07/19 1455    Subjective   Pt. commuted from [place] today.    Patient is accompanied by:  Family member    Pertinent History  Per chart. Pt.  is a 24 y.o.female with a history of IV drug use, polysubstance as well as tobacco abuse, anxiety. Pt. presented to [facility] on 02/07/2019 after being found unresponsive.  EMS contacted patient received Narcan with some improvement in mental status.  Admission labs with WBC 20,700, potassium 5.2, BUN 25, creatinine 0.67, urine drug screen positive cocaine, marijuana and benzodiazepines, alcohol level less than 10, SARS coronavirus negative.  Cranial CT scan showed areas of indistinct low-density in the right basal ganglia with mass-effect on the right lateral ventricle.  Patient did not receive TPA.  MRI showed acute infarction of right MCA territory, affecting the basal ganglia and affecting the cortical and subcortical brain in a largely watershed distribution. Pt. had a previous inpatient rehab admission 05/01/2016 to 05/09/2016 for hypoxic encephalopathy after drug overdose requiring tracheostomy, and was later decannulated. Pt. resides with her boyfriend in an apartment. 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    Patient Stated Goals  Patient reports she would like to be as independent as possible with all her daily tasks.    Currently in Pain?  Yes    Pain Score  1     Pain Location  Finger (Comment which one)    Pain Orientation  Left  Pain Descriptors / Indicators  Aching    Pain Onset  Today     OT Treatment  Therapeutic Exercise:  Pt. performed AROM for left shoulder flexion, abduction, horizontal abduction, adduction, elbow flexion, extension, forearm supination, wrist extension in sitting. Pt. worked on left wrist, and digit extension with her forearm supported at the tabletop.   Neuromuscular re-education:   Pt. worked on grasping coins, and sliding them off of the edge of a raised flat board at the tabletop using her 2nd digit to her thumb. Pt. worked on alternating weightbearing with [place].   Manual Therapy:  Pt. tolerated soft tissues mobilizations for  metacarpal spread stretches to prepare the LE for ROM, and functional hand use. Manual techniques were performed independent of, and in preparation for there. Ex.   Pt. commuted from [place] today. Pt. Continues to present with flexor tone which is limiting active left 3rd,  4th, and 5th digit MP extension, with the 4th digit having the most tone. Pt. responds well to inhibitory techniques to her left hand, and manual techniques. Pt. required increased weightbearing, and proprioception today. Pt. continues to work on normalizing tone, and facilitate active movement in order to increase engagement of her LUE during ADLS, and IADL tasks.                        OT Education - 07/07/19 1456    Education Details  ROM    Person(s) Educated  Patient    Methods  Explanation;Demonstration;Tactile cues;Verbal cues;Handout    Comprehension  Verbalized understanding;Returned demonstration;Need further instruction          OT Long Term Goals - 06/13/19 1753      OT LONG TERM GOAL #2   Title  Pt. will increase active shoulder abduction by 20 degrees in preparation to be able to wash her hair.    Baseline  Shoulder abduction: 108 Pt. is able to rinse her hair not wash it. 04/25/2019: shoulder abduction:95. Pt. is unable to use her LUE to wash her hair. Eval: Active shoulder abduction 56 degrees.    Time  12    Period  Weeks    Status  Partially Met    Target Date  08/29/19      OT LONG TERM GOAL #6   Title  Pt. will assume a mature tripod grasp on a pen while writing her name wiht 100% legibility.    Baseline  Pt. conitnues to be unable to assume a mature tripod grasp on a pen when drawing a line.    Time  12    Period  Weeks    Status  On-going    Target Date  08/29/19      OT LONG TERM GOAL #7   Title  Pt. will independently type a 3 sentence email message efficiently with 100% accuracy    Baseline  Pt. is unable.    Time  12    Period  Weeks    Status  On-going       OT LONG TERM GOAL #8   Title  Pt. will improve left hand [facility] skills in order to independentlymanipulate small objects needed during ADLs, and IADLs,    Baseline  Left hand [facility] skills are limited.    Time  12    Period  Weeks    Status  On-going      OT LONG TERM GOAL  #9   TITLE  Pt. will use her left  hand to independently handle, and use cooking, and baking utensils.    Baseline  Pt. conitnues to have difficulty    Time  12    Period  Weeks    Status  On-going    Target Date  08/29/19            Plan - 07/07/19 1720    Clinical Impression Statement  Pt. commuted from [facility] today. Pt. Continues to present with flexor tone which is limiting active left 3rd,  4th, and 5th digit MP extension, with the 4th digit having the most tone. Pt. responds well to inhibitory techniques to her left hand, and manual techniques. Pt. required increased weightbearing, and proprioception today. Pt. continues to work on normalizing tone, and facilitate active movement in order to increase engagement of her LUE during ADLS, and IADL tasks.   OT Occupational Profile and History  Detailed Assessment- Review of Records and additional review of physical, cognitive, psychosocial history related to current functional performance    Occupational performance deficits (Please refer to evaluation for details):  ADL's;IADL's    Body Structure / Function / Physical Skills  ADL;IADL;Coordination;Endurance;UE functional use;Decreased knowledge of precautions;Dexterity;FMC;ROM;Tone;Strength    Rehab Potential  Good    Clinical Decision Making  Several treatment options, min-mod task modification necessary    Comorbidities Affecting Occupational Performance:  May have comorbidities impacting occupational performance    Modification or Assistance to Complete Evaluation   Min-Moderate modification of tasks or assist with assess necessary to complete eval    OT Frequency  2x / week    OT Duration  12 weeks    OT  Treatment/Interventions  Self-care/ADL training;DME and/or AE instruction;Energy conservation;Therapeutic activities;Patient/family education;Therapeutic exercise;Neuromuscular education    Consulted and Agree with Plan of Care  Patient       Patient will benefit from skilled therapeutic intervention in order to improve the following deficits and impairments:   Body Structure / Function / Physical Skills: ADL, IADL, Coordination, Endurance, UE functional use, Decreased knowledge of precautions, Dexterity, FMC, ROM, Tone, Strength       Visit Diagnosis: Muscle weakness (generalized)  Other lack of coordination    Problem List Patient Active Problem List   Diagnosis Date Noted  . Tracheostomy status ([place]) 07/05/2019  . Major depressive disorder, recurrent episode, severe ([place]) 06/21/2019  . Postcoital UTI 05/24/2019  . Spastic hemiparesis ([place])   . Vascular headache   . Mood disorder in conditions classified elsewhere   . 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 Polysubstance dependence in early, early partial, sustained full, or sustained partial remission ([place])   . Dyslipidemia   . Ischemic stroke diagnosed during current admission ([place]) 02/13/2019  . MDD (major depressive disorder), recurrent episode, moderate ([place]) 02/10/2019  . CVA (cerebral vascular accident) ([place]) 02/07/2019  . TBI (traumatic brain injury) ([place]) 05/01/2016  . 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 Elevated troponin     Harrel Carina, MS, OTR/L 07/07/2019, 5:23 PM  [place] MAIN [facility] SERVICES [address] [place], [place], [address] Phone: [phone]   Fax:  [phone]  Name: Jane Watkins MRN: [id number] Date of Birth: 11-16-1995

## 2019-07-14 ENCOUNTER — Other Ambulatory Visit: Payer: Self-pay

## 2019-07-14 ENCOUNTER — Encounter: Payer: Self-pay | Admitting: Occupational Therapy

## 2019-07-14 ENCOUNTER — Ambulatory Visit: Payer: 59 | Attending: Physical Medicine & Rehabilitation | Admitting: Physical Therapy

## 2019-07-14 ENCOUNTER — Encounter: Payer: Self-pay | Admitting: Physical Therapy

## 2019-07-14 ENCOUNTER — Ambulatory Visit: Payer: 59 | Admitting: Occupational Therapy

## 2019-07-14 DIAGNOSIS — R41844 Frontal lobe and executive function deficit: Secondary | ICD-10-CM

## 2019-07-14 DIAGNOSIS — M6281 Muscle weakness (generalized): Secondary | ICD-10-CM | POA: Diagnosis not present

## 2019-07-14 DIAGNOSIS — R2689 Other abnormalities of gait and mobility: Secondary | ICD-10-CM | POA: Diagnosis present

## 2019-07-14 DIAGNOSIS — I639 Cerebral infarction, unspecified: Secondary | ICD-10-CM

## 2019-07-14 DIAGNOSIS — R278 Other lack of coordination: Secondary | ICD-10-CM | POA: Insufficient documentation

## 2019-07-14 DIAGNOSIS — M25512 Pain in left shoulder: Secondary | ICD-10-CM | POA: Diagnosis present

## 2019-07-14 DIAGNOSIS — I63511 Cerebral infarction due to unspecified occlusion or stenosis of right middle cerebral artery: Secondary | ICD-10-CM

## 2019-07-14 NOTE — Therapy (Signed)
[place] MAIN [facility] SERVICES [address] [place], [place], [address] Phone: [phone]   Fax:  [phone]  Occupational Therapy Treatment  Patient Details  Name: Jane Watkins MRN: [id number] Date of Birth: [birth year] Referring Provider (OT): Dr. [name]   Encounter Date: 07/14/2019  OT End of Session - 07/14/19 1612    Visit Number  24    Number of Visits  19    Date for OT Re-Evaluation  08/29/19    Authorization Type  Progress report period starting 06/16/2019    OT Start Time  1445    OT Stop Time  1530    OT Time Calculation (min)  45 min    Activity Tolerance  Patient tolerated treatment well    Behavior During Therapy  Flat affect       Past Medical History:  Diagnosis Date  . AKI (acute kidney injury) ([place]) 2018   "from overdose"  . Anxiety   . Chlamydia   . Daily headache   . Depression   . Drug overdose 04/2016   [name] 05/01/2016  . GERD (gastroesophageal reflux disease)    "when I was younger; gone now" (09/21/2017)  . IV drug abuse ([place]) 09/20/2017  . Migraine    "q couple weeks" (09/21/2017)  . Opioid abuse ([place])   . Overdose   . Stroke [facility])     Past Surgical History:  Procedure Laterality Date  . APPENDECTOMY  04/2013  . I & D EXTREMITY Left 09/20/2017   Procedure: IRRIGATION AND DEBRIDEMENT LEFT ARM;  Surgeon: [name], MD;  Location: [place];  Service: Orthopedics;  Laterality: Left;  . TEE WITHOUT CARDIOVERSION N/A 02/10/2019   Procedure: TRANSESOPHAGEAL ECHOCARDIOGRAM (TEE);  Surgeon: [name], MD;  Location: [facility];  Service: Cardiovascular;  Laterality: N/A;  Polysubstance abuser  . TONGUE SURGERY  ~ 2007   "related to lisp"  . TRACHEOSTOMY  04/2016   [name] 05/01/2016    There were no vitals filed for this visit.  Subjective Assessment - 07/14/19 1611    Subjective   Pt. reports tone feels tighter in her left hand today.    Patient is accompanied by:  Family member    Pertinent  History  Per chart. Pt. is a 24 y.o.female with a history of IV drug use, polysubstance as well as tobacco abuse, anxiety. Pt. presented to [facility] on 02/07/2019 after being found unresponsive.  EMS contacted patient received Narcan with some improvement in mental status.  Admission labs with WBC 20,700, potassium 5.2, BUN 25, creatinine 0.67, urine drug screen positive cocaine, marijuana and benzodiazepines, alcohol level less than 10, SARS coronavirus negative.  Cranial CT scan showed areas of indistinct low-density in the right basal ganglia with mass-effect on the right lateral ventricle.  Patient did not receive TPA.  MRI showed acute infarction of right MCA territory, affecting the basal ganglia and affecting the cortical and subcortical brain in a largely watershed distribution. Pt. had a previous inpatient rehab admission 05/01/2016 to 05/09/2016 for hypoxic encephalopathy after drug overdose requiring tracheostomy, and was later decannulated. Pt. resides with her boyfriend in an apartment. 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    Patient Stated Goals  Patient reports she would like to be as independent as possible with all her daily tasks.    Currently in Pain?  No/denies      OT Treatment  Therapeutic Exercise:  Pt. performed AROM for left shoulder flexion, abduction, horizontal  abduction, adduction, elbow flexion, extension, forearm supination, wrist extension in sitting. Pt. worked on left AROM wrist extension, AROM/PROM for 2nd, 3rd, and 4th digit MP, PIP flexion,  and extension  Neuromuscular re-education:  Pt. worked on grasping, and manipulating 1", 3/4", and 1/2" washers from a magnetic dish using point grasp pattern. Pt. worked on reaching up, stabilizing, and sustaining shoulder elevation while placing the washer over a small precise target on vertical dowels positioned at various angles.   Manual Therapy:  Pt. tolerated soft tissue mobilizations  for metacarpal spread stretches to prepare the LE for ROM, and functional hand use. Manual techniques were performed independent of, and in preparation for there. Ex.   Pt. Reports wearing her splint last night secondary to increased flexor tone, and spasticity in her left hand. Pt. Education was provided about inhibitory techniaques for helping to normalize tone. Pt. presented with flexor tone which is limiting active left 3rd,  4th, and 5th digit MP extension, with the 4th digit continuing to have the most tone. Pt. responds well to inhibitory techniques to her left hand, and manual techniques. Pt. required increased weightbearing, and proprioception during the task today. Pt. continues to work on normalizing tone, and facilitaing active movement in order to increase engagement of her LUE during ADLS, and IADL tasks.                      OT Education - 07/14/19 1612    Education Details  ROM    Person(s) Educated  Patient    Methods  Explanation;Demonstration;Tactile cues;Verbal cues;Handout    Comprehension  Verbalized understanding;Returned demonstration;Need further instruction          OT Long Term Goals - 06/13/19 1753      OT LONG TERM GOAL #2   Title  Pt. will increase active shoulder abduction by 20 degrees in preparation to be able to wash her hair.    Baseline  Shoulder abduction: 108 Pt. is able to rinse her hair not wash it. 04/25/2019: shoulder abduction:95. Pt. is unable to use her LUE to wash her hair. Eval: Active shoulder abduction 56 degrees.    Time  12    Period  Weeks    Status  Partially Met    Target Date  08/29/19      OT LONG TERM GOAL #6   Title  Pt. will assume a mature tripod grasp on a pen while writing her name wiht 100% legibility.    Baseline  Pt. conitnues to be unable to assume a mature tripod grasp on a pen when drawing a line.    Time  12    Period  Weeks    Status  On-going    Target Date  08/29/19      OT LONG TERM GOAL #7    Title  Pt. will independently type a 3 sentence email message efficiently with 100% accuracy    Baseline  Pt. is unable.    Time  12    Period  Weeks    Status  On-going      OT LONG TERM GOAL #8   Title  Pt. will improve left hand [facility] skills in order to independentlymanipulate small objects needed during ADLs, and IADLs,    Baseline  Left hand [facility] skills are limited.    Time  12    Period  Weeks    Status  On-going      OT LONG TERM GOAL  #9  TITLE  Pt. will use her left hand to independently handle, and use cooking, and baking utensils.    Baseline  Pt. conitnues to have difficulty    Time  12    Period  Weeks    Status  On-going    Target Date  08/29/19            Plan - 07/14/19 1613    Clinical Impression Statement  Pt. Reports wearing her splint last night secondary to increased flexor tone, and spasticity in her left hand. Pt. Education was provided about inhibitory techniaques for helping to normalize tone. Pt. presented with flexor tone which is limiting active left 3rd,  4th, and 5th digit MP extension, with the 4th digit continuing to have the most tone. Pt. responds well to inhibitory techniques to her left hand, and manual techniques. Pt. required increased weightbearing, and proprioception during the task today. Pt. continues to work on normalizing tone, and facilitaing active movement in order to increase engagement of her LUE during ADLS, and IADL tasks.     OT Occupational Profile and History  Detailed Assessment- Review of Records and additional review of physical, cognitive, psychosocial history related to current functional performance    Occupational performance deficits (Please refer to evaluation for details):  ADL's;IADL's    Body Structure / Function / Physical Skills  ADL;IADL;Coordination;Endurance;UE functional use;Decreased knowledge of precautions;Dexterity;FMC;ROM;Tone;Strength    Rehab Potential  Good    Clinical Decision Making  Several  treatment options, min-mod task modification necessary    Comorbidities Affecting Occupational Performance:  May have comorbidities impacting occupational performance    Modification or Assistance to Complete Evaluation   Min-Moderate modification of tasks or assist with assess necessary to complete eval    OT Frequency  2x / week    OT Duration  12 weeks    OT Treatment/Interventions  Self-care/ADL training;DME and/or AE instruction;Energy conservation;Therapeutic activities;Patient/family education;Therapeutic exercise;Neuromuscular education    Consulted and Agree with Plan of Care  Patient       Patient will benefit from skilled therapeutic intervention in order to improve the following deficits and impairments:   Body Structure / Function / Physical Skills: ADL, IADL, Coordination, Endurance, UE functional use, Decreased knowledge of precautions, Dexterity, FMC, ROM, Tone, Strength       Visit Diagnosis: Muscle weakness (generalized)  Other lack of coordination    Problem List Patient Active Problem List   Diagnosis Date Noted  . Tracheostomy status ([place]) 07/05/2019  . Major depressive disorder, recurrent episode, severe ([place]) 06/21/2019  . Postcoital UTI 05/24/2019  . Spastic hemiparesis ([place])   . Vascular headache   . Mood disorder in conditions classified elsewhere   . 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 Polysubstance dependence in early, early partial, sustained full, or sustained partial remission ([place])   . Dyslipidemia   . Ischemic stroke diagnosed during current admission ([place]) 02/13/2019  . MDD (major depressive disorder), recurrent episode, moderate ([place]) 02/10/2019  . CVA (cerebral vascular accident) ([place]) 02/07/2019  . TBI (traumatic brain injury) ([place]) 05/01/2016  . 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 Elevated troponin     Harrel Carina, MS, OTR/L 07/14/2019, 4:15 PM  [place] MAIN  [facility] SERVICES [address] [place], [place], [address] Phone: [phone]   Fax:  [phone]  Name: Jane Watkins MRN: [id number] Date of Birth: 1995-11-11

## 2019-07-14 NOTE — Therapy (Signed)
[place] MAIN [facility] SERVICES [address] [place], [place], [address] Phone: [phone]   Fax:  [phone]  Physical Therapy Treatment  Patient Details  Name: Jane Watkins MRN: [id number] Date of Birth: 10-05-1995 Referring Provider (PT): Dr. Earnest Conroy. [name]   Encounter Date: 07/14/2019  PT End of Session - 07/14/19 1436    Visit Number  23    Number of Visits  24    Date for PT Re-Evaluation  08/17/19    Authorization Type  [employer], 60 visit limit per discipline    PT Start Time  0205    PT Stop Time  0230    PT Time Calculation (min)  25 min    Equipment Utilized During Treatment  Gait belt    Activity Tolerance  Patient tolerated treatment well;No increased pain    Behavior During Therapy  Flat affect       Past Medical History:  Diagnosis Date  . AKI (acute kidney injury) ([place]) 2018   "from overdose"  . Anxiety   . Chlamydia   . Daily headache   . Depression   . Drug overdose 04/2016   [name] 05/01/2016  . GERD (gastroesophageal reflux disease)    "when I was younger; gone now" (09/21/2017)  . IV drug abuse ([place]) 09/20/2017  . Migraine    "q couple weeks" (09/21/2017)  . Opioid abuse ([place])   . Overdose   . Stroke [facility])     Past Surgical History:  Procedure Laterality Date  . APPENDECTOMY  04/2013  . I & D EXTREMITY Left 09/20/2017   Procedure: IRRIGATION AND DEBRIDEMENT LEFT ARM;  Surgeon: [name], MD;  Location: [place];  Service: Orthopedics;  Laterality: Left;  . TEE WITHOUT CARDIOVERSION N/A 02/10/2019   Procedure: TRANSESOPHAGEAL ECHOCARDIOGRAM (TEE);  Surgeon: [name], MD;  Location: [facility];  Service: Cardiovascular;  Laterality: N/A;  Polysubstance abuser  . TONGUE SURGERY  ~ 2007   "related to lisp"  . TRACHEOSTOMY  04/2016   [name] 05/01/2016    There were no vitals filed for this visit.  Subjective Assessment - 07/14/19 1433    Subjective  Patient arrives a little late today, she is ready for her  therapy.    Patient is accompained by:  --   significant other   Pertinent History  Pt is 24 y.o. female presenting to hospital 02/07/19 initially,  MRI showing acute infarct R MCA territory affecting basal ganglia and affecting cortical and subcortical brain in a largely watershed distribution. After in hospital stay patient transitioned to CIR from 02/16/2019-03/01/2019.  PMH includes IV drug abuse, anxiety, depression, migraines, AKI, hypoxic ischemic encephalopathy 2018, opiate overdose 2018, h/o trach 2018, h/o I&D L UE. PLOF: lives with boyfriend in one story apartment, prior to CVA patient was independent, working.    Limitations  Walking;Reading;Lifting;Writing;House hold activities;Standing    How long can you sit comfortably?  NA    How long can you stand comfortably?  20 mins    How long can you walk comfortably?  20 mins    Diagnostic tests  see MRI results    Patient Stated Goals  Patient wants to maximize how much she can walk, get back to normal    Currently in Pain?  No/denies    Pain Score  0-No pain    Pain Onset  Today        Treatment: Leg press 70 lbs x 20 x 3, BLE Matrix 22.5 lbs side stepping, backwards/  fwd stepping x 5 Patient performed with instruction, verbal cues, tactile cues of therapist: goal: increase tissue extensibility, promote proper posture, improve mobility                        PT Education - 07/14/19 1435    Education Details  HEP    Person(s) Educated  Patient    Methods  Explanation    Comprehension  Verbalized understanding       PT Short Term Goals - 04/25/19 1627      PT SHORT TERM GOAL #1   Title  Pt will be independent with initial HEP in order to indicate improved strength and decreased fall risk.    Baseline  initial HEP administered on eval 03/02/2019; 04/25/19: "not as much as should."    Time  6    Period  Weeks    Status  Partially Met    Target Date  04/13/19        PT Long Term Goals - 06/13/19 1516       PT LONG TERM GOAL #1   Title  Pt will be independent with final HEP in order to indicate improved strength and decreased fall risk.    Baseline  initial HEP administered on eval 03/02/19; 04/25/19: "not as much as a should."    Time  12    Period  Weeks    Status  On-going    Target Date  08/17/19      PT LONG TERM GOAL #2   Title  Pt will decrease 5TSTS to less than 10 seconds in order to demonstrate clinically significant improvement in LE strength and functional mobility.    Baseline  03/02/2019: 17 seconds without UE support, dependent on RLE, very little weight shift to LLE; 04/25/19: 7.7s, no UE, decreased weight shifting to LLE;    Time  12    Period  Weeks    Status  Achieved      PT LONG TERM GOAL #3   Title  Pt will perform 6MWT > 1015f with LRAD at mod I level in order to indicate safe community/leisure negotiation.    Baseline  03/02/2019 538f 04/25/19: 1000'    Time  12    Period  Weeks    Status  Partially Met    Target Date  08/17/19      PT LONG TERM GOAL #4   Title  The patient will demonstrate at least 1/2 grade improvement in LE MMT grade to improve ability to perform functional activities.    Baseline  see eval for details 03/02/2019; 04/25/19: see visit note, significant improvement, still lacking L anikle dorsiflexion, eversion, or inversion    Time  12    Period  Weeks    Status  Partially Met    Target Date  08/17/19      PT LONG TERM GOAL #5   Title  Pt will ambulate >1 m/s with LRAD mod I to indicate gait velocity of community ambulator.    Baseline  03/02/19: self selected: .57 m/s, fastest .722mwith quad cane; 04/25/19: Self-selected: 10.8s = 0.93 m/s; Fastest: 8.8s = 1.14 m/s, performed with no AD    Time  12    Period  Weeks    Status  Partially Met    Target Date  08/17/19            Plan - 07/14/19 1438    Clinical Impression Statement   Pt was  able to perform all exercises today with CGA.Marland Kitchen Pt was able to perform all balance and strength  exercises, demonstrating improvements in LE strength and stability.  Pt was able to complete dynamic balance exercises, showing ability to ambulate on dynamic on surfaces with CGA assist and improve postural reactions to correct self during activities.  Pt requires verbal, visual and tactile cues during exercise in order to complete tasks with proper form and technique.  Pt would continue to benefit from skilled PT services in order to further strengthen LE's, improve static and dynamic balance, and improve coordination in order to increase functional mobility and decrease risk of falls   Personal Factors and Comorbidities  Behavior Pattern    Examination-Activity Limitations  Bathing;Dressing;Sit;Transfers;Sleep;Bed Mobility;Bend;Caring for Others;Carry;Toileting;Reach Overhead;Stand;Locomotion Level;Stairs;Squat;Lift;Hygiene/Grooming    Examination-Participation Restrictions  Church;Interpersonal Relationship;Personal Finances;Yard Work;Cleaning;Laundry;School;Community Activity;Medication Management;Driving;Meal Prep;Shop    Rehab Potential  Good    Clinical Impairments Affecting Rehab Potential  unsure of severity of cognitive/psych deficits    PT Frequency  2x / week    PT Duration  12 weeks    PT Treatment/Interventions  ADLs/Self Care Home Management;DME Instruction;Gait training;Stair training;Functional mobility training;Therapeutic activities;Therapeutic exercise;Balance training;Neuromuscular re-education;Cognitive remediation;Patient/family education;Vestibular;Energy conservation;Cryotherapy;Ultrasound;Electrical Stimulation;Fluidtherapy;Iontophoresis 67m/ml Dexamethasone;Canalith Repostioning;Moist Heat;Traction;Passive range of motion;Manual techniques;Joint Manipulations;Spinal Manipulations;Splinting;Taping;Scar mobilization;Wheelchair mobility training;Prosthetic Training;Orthotic Fit/Training;Dry needling;Visual/perceptual remediation/compensation;Aquatic Therapy    PT Next Visit Plan   continue with LE strengthening, NME, and motor control activities    PT Home Exercise Plan  Access Code: [email or web address]    Consulted and Agree with Plan of Care  Patient       Patient will benefit from skilled therapeutic intervention in order to improve the following deficits and impairments:  Decreased activity tolerance, Decreased balance, Decreased cognition, Decreased endurance, Decreased knowledge of use of DME, Decreased mobility, Decreased strength, Impaired perceived functional ability, Postural dysfunction, Abnormal gait, Difficulty walking, Impaired tone, Decreased range of motion, Decreased coordination, Impaired UE functional use, Pain  Visit Diagnosis: Muscle weakness (generalized)  Other lack of coordination  Right middle cerebral artery stroke (HCC)  Other abnormalities of gait and mobility  Acute pain of left shoulder  Ischemic stroke diagnosed during current admission ([place]  Frontal lobe and executive function deficit     Problem List Patient Active Problem List   Diagnosis Date Noted  . Tracheostomy status ([place] 07/05/2019  . Major depressive disorder, recurrent episode, severe ([place] 06/21/2019  . Postcoital UTI 05/24/2019  . Spastic hemiparesis ([place]   . Vascular headache   . Mood disorder in conditions classified elsewhere   . Right middle cerebral artery stroke ([place] 02/16/2019  . Polysubstance dependence in early, early partial, sustained full, or sustained partial remission ([place]   . Dyslipidemia   . Ischemic stroke diagnosed during current admission ([place] 02/13/2019  . MDD (major depressive disorder), recurrent episode, moderate ([place] 02/10/2019  . CVA (cerebral vascular accident) ([place] 02/07/2019  . TBI (traumatic brain injury) ([place] 05/01/2016  . 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 Elevated troponin     MAlanson Puls PVirginiaDPT 07/14/2019, 2:40 PM  [place] [facility] [address] [place] [address]Phone: [phone]  Fax:  [phone] Name: LVito BackersMRN: [id number] of Birth: 6January 22, 1997

## 2019-07-18 ENCOUNTER — Ambulatory Visit: Payer: 59 | Admitting: Physical Therapy

## 2019-07-18 ENCOUNTER — Encounter: Payer: Self-pay | Admitting: Physical Therapy

## 2019-07-18 ENCOUNTER — Ambulatory Visit: Payer: 59 | Admitting: Occupational Therapy

## 2019-07-18 ENCOUNTER — Other Ambulatory Visit: Payer: Self-pay

## 2019-07-18 DIAGNOSIS — R41844 Frontal lobe and executive function deficit: Secondary | ICD-10-CM

## 2019-07-18 DIAGNOSIS — M6281 Muscle weakness (generalized): Secondary | ICD-10-CM | POA: Diagnosis not present

## 2019-07-18 DIAGNOSIS — R2689 Other abnormalities of gait and mobility: Secondary | ICD-10-CM

## 2019-07-18 DIAGNOSIS — I639 Cerebral infarction, unspecified: Secondary | ICD-10-CM

## 2019-07-18 DIAGNOSIS — R278 Other lack of coordination: Secondary | ICD-10-CM

## 2019-07-18 DIAGNOSIS — I63511 Cerebral infarction due to unspecified occlusion or stenosis of right middle cerebral artery: Secondary | ICD-10-CM

## 2019-07-18 DIAGNOSIS — M25512 Pain in left shoulder: Secondary | ICD-10-CM

## 2019-07-18 NOTE — Therapy (Signed)
[place] MAIN [facility] SERVICES [address] [place], [place], [address] Phone: [phone]   Fax:  [phone]  Physical Therapy Treatment  Patient Details  Name: Jane Watkins MRN: [id number] Date of Birth: March 18, 1995 Referring Provider (PT): Dr. Earnest Conroy. [name]   Encounter Date: 07/18/2019  PT End of Session - 07/18/19 1204    Visit Number  25    Number of Visits  24    Date for PT Re-Evaluation  08/17/19    Authorization Type  [employer], 60 visit limit per discipline    PT Start Time  1100    PT Stop Time  1140    PT Time Calculation (min)  40 min    Equipment Utilized During Treatment  Gait belt    Activity Tolerance  Patient tolerated treatment well;No increased pain    Behavior During Therapy  Flat affect       Past Medical History:  Diagnosis Date  . AKI (acute kidney injury) ([place]) 2018   "from overdose"  . Anxiety   . Chlamydia   . Daily headache   . Depression   . Drug overdose 04/2016   [name] 05/01/2016  . GERD (gastroesophageal reflux disease)    "when I was younger; gone now" (09/21/2017)  . IV drug abuse ([place]) 09/20/2017  . Migraine    "q couple weeks" (09/21/2017)  . Opioid abuse ([place])   . Overdose   . Stroke [facility])     Past Surgical History:  Procedure Laterality Date  . APPENDECTOMY  04/2013  . I & D EXTREMITY Left 09/20/2017   Procedure: IRRIGATION AND DEBRIDEMENT LEFT ARM;  Surgeon: [name], MD;  Location: [place];  Service: Orthopedics;  Laterality: Left;  . TEE WITHOUT CARDIOVERSION N/A 02/10/2019   Procedure: TRANSESOPHAGEAL ECHOCARDIOGRAM (TEE);  Surgeon: [name], MD;  Location: [facility];  Service: Cardiovascular;  Laterality: N/A;  Polysubstance abuser  . TONGUE SURGERY  ~ 2007   "related to lisp"  . TRACHEOSTOMY  04/2016   [name] 05/01/2016    There were no vitals filed for this visit.  Subjective Assessment - 07/18/19 1201    Subjective  Patient arrives today and is not feeling well and just  vomited.    Patient is accompained by:  --   significant other   Pertinent History  Pt is 24 y.o. female presenting to hospital 02/07/19 initially,  MRI showing acute infarct R MCA territory affecting basal ganglia and affecting cortical and subcortical brain in a largely watershed distribution. After in hospital stay patient transitioned to CIR from 02/16/2019-03/01/2019.  PMH includes IV drug abuse, anxiety, depression, migraines, AKI, hypoxic ischemic encephalopathy 2018, opiate overdose 2018, h/o trach 2018, h/o I&D L UE. PLOF: lives with boyfriend in one story apartment, prior to CVA patient was independent, working.    Limitations  Walking;Reading;Lifting;Writing;House hold activities;Standing    How long can you sit comfortably?  NA    How long can you stand comfortably?  20 mins    How long can you walk comfortably?  20 mins    Diagnostic tests  see MRI results    Patient Stated Goals  Patient wants to maximize how much she can walk, get back to normal    Currently in Pain?  Yes    Pain Score  3     Pain Location  Ankle    Pain Orientation  Left    Pain Descriptors / Indicators  Aching    Pain Type  Acute pain  Pain Onset  Today    Aggravating Factors   walking    Pain Relieving Factors  stretching    Multiple Pain Sites  No        Treatment: PROM to left ankle 30 sec x 2  Roller stick to calf and hamstring x STM  Standing hamstring LLE x 30 sec on step x 3  Gait training with L ankle brace with cues to straighten left knee with stance phase of gait.  Patient performed with instruction, verbal cues, tactile cues of therapist: goal: increase tissue extensibility, promote proper posture, improve mobility                         PT Education - 07/18/19 1204    Education provided  Yes    Education Details  HEP    Person(s) Educated  Patient    Methods  Explanation    Comprehension  Verbalized understanding       PT Short Term Goals - 04/25/19 1627       PT SHORT TERM GOAL #1   Title  Pt will be independent with initial HEP in order to indicate improved strength and decreased fall risk.    Baseline  initial HEP administered on eval 03/02/2019; 04/25/19: "not as much as should."    Time  6    Period  Weeks    Status  Partially Met    Target Date  04/13/19        PT Long Term Goals - 06/13/19 1516      PT LONG TERM GOAL #1   Title  Pt will be independent with final HEP in order to indicate improved strength and decreased fall risk.    Baseline  initial HEP administered on eval 03/02/19; 04/25/19: "not as much as a should."    Time  12    Period  Weeks    Status  On-going    Target Date  08/17/19      PT LONG TERM GOAL #2   Title  Pt will decrease 5TSTS to less than 10 seconds in order to demonstrate clinically significant improvement in LE strength and functional mobility.    Baseline  03/02/2019: 17 seconds without UE support, dependent on RLE, very little weight shift to LLE; 04/25/19: 7.7s, no UE, decreased weight shifting to LLE;    Time  12    Period  Weeks    Status  Achieved      PT LONG TERM GOAL #3   Title  Pt will perform 6MWT > 1048f with LRAD at mod I level in order to indicate safe community/leisure negotiation.    Baseline  03/02/2019 5365f 04/25/19: 1000'    Time  12    Period  Weeks    Status  Partially Met    Target Date  08/17/19      PT LONG TERM GOAL #4   Title  The patient will demonstrate at least 1/2 grade improvement in LE MMT grade to improve ability to perform functional activities.    Baseline  see eval for details 03/02/2019; 04/25/19: see visit note, significant improvement, still lacking L anikle dorsiflexion, eversion, or inversion    Time  12    Period  Weeks    Status  Partially Met    Target Date  08/17/19      PT LONG TERM GOAL #5   Title  Pt will ambulate >1 m/s with LRAD mod I to  indicate gait velocity of community ambulator.    Baseline  03/02/19: self selected: .57 m/s, fastest .30ms with  quad cane; 04/25/19: Self-selected: 10.8s = 0.93 m/s; Fastest: 8.8s = 1.14 m/s, performed with no AD    Time  12    Period  Weeks    Status  Partially Met    Target Date  08/17/19            Plan - 07/18/19 1205    Clinical Impression Statement   Pt was able to perform all exercises today with PROM.   Pt requires verbal, visual and tactile cues during exercise in order to complete tasks with proper form and technique.  Pt would continue to benefit from skilled PT services in order to further strengthen LE's, improve static and dynamic balance, and improve coordination in order to increase functional mobility.   Personal Factors and Comorbidities  Behavior Pattern    Examination-Activity Limitations  Bathing;Dressing;Sit;Transfers;Sleep;Bed Mobility;Bend;Caring for Others;Carry;Toileting;Reach Overhead;Stand;Locomotion Level;Stairs;Squat;Lift;Hygiene/Grooming    Examination-Participation Restrictions  Church;Interpersonal Relationship;Personal Finances;Yard Work;Cleaning;Laundry;School;Community Activity;Medication Management;Driving;Meal Prep;Shop    Rehab Potential  Good    Clinical Impairments Affecting Rehab Potential  unsure of severity of cognitive/psych deficits    PT Frequency  2x / week    PT Duration  12 weeks    PT Treatment/Interventions  ADLs/Self Care Home Management;DME Instruction;Gait training;Stair training;Functional mobility training;Therapeutic activities;Therapeutic exercise;Balance training;Neuromuscular re-education;Cognitive remediation;Patient/family education;Vestibular;Energy conservation;Cryotherapy;Ultrasound;Electrical Stimulation;Fluidtherapy;Iontophoresis 466mml Dexamethasone;Canalith Repostioning;Moist Heat;Traction;Passive range of motion;Manual techniques;Joint Manipulations;Spinal Manipulations;Splinting;Taping;Scar mobilization;Wheelchair mobility training;Prosthetic Training;Orthotic Fit/Training;Dry needling;Visual/perceptual  remediation/compensation;Aquatic Therapy    PT Next Visit Plan  continue with LE strengthening, NME, and motor control activities    PT Home Exercise Plan  Access Code: [email or web address]    Consulted and Agree with Plan of Care  Patient       Patient will benefit from skilled therapeutic intervention in order to improve the following deficits and impairments:  Decreased activity tolerance, Decreased balance, Decreased cognition, Decreased endurance, Decreased knowledge of use of DME, Decreased mobility, Decreased strength, Impaired perceived functional ability, Postural dysfunction, Abnormal gait, Difficulty walking, Impaired tone, Decreased range of motion, Decreased coordination, Impaired UE functional use, Pain  Visit Diagnosis: Muscle weakness (generalized)  Other lack of coordination  Right middle cerebral artery stroke (HCC)  Other abnormalities of gait and mobility  Acute pain of left shoulder  Ischemic stroke diagnosed during current admission (HCDay Frontal lobe and executive function deficit     Problem List Patient Active Problem List   Diagnosis Date Noted  . Tracheostomy status (HCD[place]05/25/2021  . Major depressive disorder, recurrent episode, severe (HCCap[place]05/12/2019  . Postcoital UTI 05/24/2019  . Spastic hemiparesis (HCCastro  . Vascular headache   . Mood disorder in conditions classified elsewhere   . Right middle cerebral artery stroke (HCR[place]01/07/2019  . Polysubstance dependence in early, early partial, sustained full, or sustained partial remission (HCPottawatom[place]  . Dyslipidemia   . Ischemic stroke diagnosed during current admission (HCSumm[place]01/04/2019  . MDD (major depressive disorder), recurrent episode, moderate (HCHans[facility] AF[facility]12/31/2020  . CVA (cerebral vascular accident) ([place]CS[place]12/28/2020  . TBI (traumatic brain injury) (HCEv[place]03/22/2018  . 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 Elevated troponin     MaAlanson PulsPTVirginiaPT 07/18/2019, 12:07 PM  CoMarshallAIN REL[facility]ERVICES [address]dGovernors VillageNCAl[place]hone: [phone] Fax:  [phone]: LyVito BackersRN: [id number] of Birth: [birth year]

## 2019-07-19 ENCOUNTER — Encounter: Payer: Self-pay | Admitting: Occupational Therapy

## 2019-07-19 NOTE — Therapy (Addendum)
[place] MAIN [facility] SERVICES [address] [place], [place], [address] Phone: [phone]   Fax:  [phone]  Occupational Therapy Treatment  Patient Details  Name: Jane Watkins MRN: [id number] Date of Birth: Sep 06, 1995 Referring Provider (OT): Dr. [name]   Encounter Date: 07/18/2019  OT End of Session - 07/19/19 0841    Visit Number  25    Number of Visits  48    Date for OT Re-Evaluation  08/29/19    Authorization Type  Progress report period starting 06/16/2019    OT Start Time  1145    OT Stop Time  1230    OT Time Calculation (min)  45 min    Activity Tolerance  Patient tolerated treatment well    Behavior During Therapy  Flat affect       Past Medical History:  Diagnosis Date  . AKI (acute kidney injury) ([place]) 2018   "from overdose"  . Anxiety   . Chlamydia   . Daily headache   . Depression   . Drug overdose 04/2016   [name] 05/01/2016  . GERD (gastroesophageal reflux disease)    "when I was younger; gone now" (09/21/2017)  . IV drug abuse ([place]) 09/20/2017  . Migraine    "q couple weeks" (09/21/2017)  . Opioid abuse ([place])   . Overdose   . Stroke [facility])     Past Surgical History:  Procedure Laterality Date  . APPENDECTOMY  04/2013  . I & D EXTREMITY Left 09/20/2017   Procedure: IRRIGATION AND DEBRIDEMENT LEFT ARM;  Surgeon: [name], MD;  Location: [place];  Service: Orthopedics;  Laterality: Left;  . TEE WITHOUT CARDIOVERSION N/A 02/10/2019   Procedure: TRANSESOPHAGEAL ECHOCARDIOGRAM (TEE);  Surgeon: [name], MD;  Location: [facility];  Service: Cardiovascular;  Laterality: N/A;  Polysubstance abuser  . TONGUE SURGERY  ~ 2007   "related to lisp"  . TRACHEOSTOMY  04/2016   [name] 05/01/2016    There were no vitals filed for this visit.  Subjective Assessment - 07/19/19 0840    Subjective   Ptreports that her birthday is tomorrow    Patient is accompanied by:  Family member    Pertinent History  Per  chart. Pt. is a 24 y.o.female with a history of IV drug use, polysubstance as well as tobacco abuse, anxiety. Pt. presented to [facility] on [facility]/07/2019 after being found unresponsive.  EMS contacted patient received Narcan with some improvement in mental status.  Admission labs with WBC 20,700, potassium 5.2, BUN 25, creatinine 0.67, urine drug screen positive cocaine, marijuana and benzodiazepines, alcohol level less than 10, SARS coronavirus negative.  Cranial CT scan showed areas of indistinct low-density in the right basal ganglia with mass-effect on the right lateral ventricle.  Patient did not receive TPA.  MRI showed acute infarction of right MCA territory, affecting the basal ganglia and affecting the cortical and subcortical brain in a largely watershed distribution. Pt. had a previous inpatient rehab admission 05/01/2016 to 05/09/2016 for hypoxic encephalopathy after drug overdose requiring tracheostomy, and was later decannulated. Pt. resides with her boyfriend in an apartment. 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    Currently in Pain?  No/denies      OT TREATMENT    Neuro muscular re-education:  Pt. tolerated weightbearing, and proprioceptive input through the LUE, and hand. Pt. required assist to prepare, and position her LUE, and hand for weightbearing, and proprioceptive input. The tone responded well to proprioception.  Therapeutic Exercise:  Pt. tolerated AAROM in all joint ranges of the LUE, and hand. Pt. tolerated PROM to the left hand MPs, PIPS, and DIPs secondary to increased flexor tone, and tightness.   Pt. reports that she has had a rough time the past few days, and that her tone has been worse. Pt. does present with increased flexor tone, and tightness through her LUE, and hand today. BP 102/59, HR 80. Pt. Responded well to weightbearing, and proprioceptive input through her hand, and ROM presenting with more active ROM at the end of the session.                        OT Education - 07/19/19 0841    Education Details  ROM    Person(s) Educated  Patient    Methods  Explanation;Demonstration;Tactile cues;Verbal cues;Handout    Comprehension  Verbalized understanding;Returned demonstration;Need further instruction          OT Long Term Goals - 06/13/19 1753      OT LONG TERM GOAL #2   Title  Pt. will increase active shoulder abduction by 20 degrees in preparation to be able to wash her hair.    Baseline  Shoulder abduction: 108 Pt. is able to rinse her hair not wash it. 04/25/2019: shoulder abduction:95. Pt. is unable to use her LUE to wash her hair. Eval: Active shoulder abduction 56 degrees.    Time  12    Period  Weeks    Status  Partially Met    Target Date  08/29/19      OT LONG TERM GOAL #6   Title  Pt. will assume a mature tripod grasp on a pen while writing her name wiht 100% legibility.    Baseline  Pt. conitnues to be unable to assume a mature tripod grasp on a pen when drawing a line.    Time  12    Period  Weeks    Status  On-going    Target Date  08/29/19      OT LONG TERM GOAL #7   Title  Pt. will independently type a 3 sentence email message efficiently with 100% accuracy    Baseline  Pt. is unable.    Time  12    Period  Weeks    Status  On-going      OT LONG TERM GOAL #8   Title  Pt. will improve left hand [facility] skills in order to independentlymanipulate small objects needed during ADLs, and IADLs,    Baseline  Left hand [facility] skills are limited.    Time  12    Period  Weeks    Status  On-going      OT LONG TERM GOAL  #9   TITLE  Pt. will use her left hand to independently handle, and use cooking, and baking utensils.    Baseline  Pt. conitnues to have difficulty    Time  12    Period  Weeks    Status  On-going    Target Date  08/29/19            Plan - 07/19/19 0842    Clinical Impression Statement Pt. reports that she has had a rough time the past few days, and that  her tone has been worse. Pt. does present with increased flexor tone, and tightness through her LUE, and hand today. BP 102/59, HR 80. Pt. Responded well to weightbearing, and proprioceptive input through her hand,  and ROM presenting with more active ROM at the end of the session.    OT Occupational Profile and History  Detailed Assessment- Review of Records and additional review of physical, cognitive, psychosocial history related to current functional performance    Occupational performance deficits (Please refer to evaluation for details):  ADL's;IADL's    Body Structure / Function / Physical Skills  ADL;IADL;Coordination;Endurance;UE functional use;Decreased knowledge of precautions;Dexterity;FMC;ROM;Tone;Strength    Rehab Potential  Good    Clinical Decision Making  Several treatment options, min-mod task modification necessary    Comorbidities Affecting Occupational Performance:  May have comorbidities impacting occupational performance    Modification or Assistance to Complete Evaluation   Min-Moderate modification of tasks or assist with assess necessary to complete eval    OT Frequency  2x / week    OT Duration  12 weeks    OT Treatment/Interventions  Self-care/ADL training;DME and/or AE instruction;Energy conservation;Therapeutic activities;Patient/family education;Therapeutic exercise;Neuromuscular education    Consulted and Agree with Plan of Care  Patient       Patient will benefit from skilled therapeutic intervention in order to improve the following deficits and impairments:   Body Structure / Function / Physical Skills: ADL, IADL, Coordination, Endurance, UE functional use, Decreased knowledge of precautions, Dexterity, FMC, ROM, Tone, Strength       Visit Diagnosis: Muscle weakness (generalized)    Problem List Patient Active Problem List   Diagnosis Date Noted  . Tracheostomy status ([place]) 07/05/2019  . Major depressive disorder, recurrent episode, severe ([place])  06/21/2019  . Postcoital UTI 05/24/2019  . Spastic hemiparesis ([place])   . Vascular headache   . Mood disorder in conditions classified elsewhere   . 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 Polysubstance dependence in early, early partial, sustained full, or sustained partial remission ([place])   . Dyslipidemia   . Ischemic stroke diagnosed during current admission ([place]) 02/13/2019  . MDD (major depressive disorder), recurrent episode, moderate ([place]) 02/10/2019  . CVA (cerebral vascular accident) ([place]) 02/07/2019  . TBI (traumatic brain injury) ([place]) 05/01/2016  . 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 Elevated troponin     Harrel Carina, MS, OTR/L 07/19/2019, 8:43 AM  [place] MAIN [facility] SERVICES [address] [place], [place], [address] Phone: [phone]   Fax:  [phone]  Name: Jane Watkins MRN: [id number] Date of Birth: 09-03-1995

## 2019-07-21 ENCOUNTER — Ambulatory Visit (INDEPENDENT_AMBULATORY_CARE_PROVIDER_SITE_OTHER): Payer: 59 | Admitting: Psychology

## 2019-07-21 ENCOUNTER — Ambulatory Visit: Payer: 59 | Admitting: Occupational Therapy

## 2019-07-21 ENCOUNTER — Ambulatory Visit: Payer: 59 | Admitting: Physical Therapy

## 2019-07-21 ENCOUNTER — Encounter: Payer: Self-pay | Admitting: Physical Therapy

## 2019-07-21 ENCOUNTER — Encounter: Payer: Self-pay | Admitting: Occupational Therapy

## 2019-07-21 ENCOUNTER — Other Ambulatory Visit: Payer: Self-pay

## 2019-07-21 DIAGNOSIS — M6281 Muscle weakness (generalized): Secondary | ICD-10-CM

## 2019-07-21 DIAGNOSIS — R278 Other lack of coordination: Secondary | ICD-10-CM

## 2019-07-21 DIAGNOSIS — R2689 Other abnormalities of gait and mobility: Secondary | ICD-10-CM

## 2019-07-21 DIAGNOSIS — R41844 Frontal lobe and executive function deficit: Secondary | ICD-10-CM

## 2019-07-21 DIAGNOSIS — F3289 Other specified depressive episodes: Secondary | ICD-10-CM

## 2019-07-21 DIAGNOSIS — M25512 Pain in left shoulder: Secondary | ICD-10-CM

## 2019-07-21 DIAGNOSIS — I63511 Cerebral infarction due to unspecified occlusion or stenosis of right middle cerebral artery: Secondary | ICD-10-CM

## 2019-07-21 DIAGNOSIS — I639 Cerebral infarction, unspecified: Secondary | ICD-10-CM

## 2019-07-21 NOTE — Therapy (Signed)
[place] MAIN [facility] SERVICES [address] [place], [place], [address] Phone: [phone]   Fax:  [phone]  Physical Therapy Treatment  Patient Details  Name: Jane Watkins MRN: [id number] Date of Birth: [birth year] Referring Provider (PT): Dr. Earnest Conroy. [name]   Encounter Date: 07/21/2019   PT End of Session - 07/21/19 1806    Visit Number 26    Number of Visits 24    Date for PT Re-Evaluation 08/17/19    Authorization Type [employer], 60 visit limit per discipline    PT Start Time 1115    PT Stop Time 1145    PT Time Calculation (min) 30 min    Equipment Utilized During Treatment Gait belt    Activity Tolerance Patient tolerated treatment well;No increased pain    Behavior During Therapy Flat affect           Past Medical History:  Diagnosis Date  . AKI (acute kidney injury) ([place]) 2018   "from overdose"  . Anxiety   . Chlamydia   . Daily headache   . Depression   . Drug overdose 04/2016   [name] 05/01/2016  . GERD (gastroesophageal reflux disease)    "when I was younger; gone now" (09/21/2017)  . IV drug abuse ([place]) 09/20/2017  . Migraine    "q couple weeks" (09/21/2017)  . Opioid abuse ([place])   . Overdose   . Stroke [facility] [place])     Past Surgical History:  Procedure Laterality Date  . APPENDECTOMY  04/2013  . I & D EXTREMITY Left 09/20/2017   Procedure: IRRIGATION AND DEBRIDEMENT LEFT ARM;  Surgeon: [name], MD;  Location: [facility];  Service: Orthopedics;  Laterality: Left;  . TEE WITHOUT CARDIOVERSION N/A 02/10/2019   Procedure: TRANSESOPHAGEAL ECHOCARDIOGRAM (TEE);  Surgeon: [name], MD;  Location: [facility];  Service: Cardiovascular;  Laterality: N/A;  Polysubstance abuser  . TONGUE SURGERY  ~ 2007   "related to lisp"  . TRACHEOSTOMY  04/2016   [name] 05/01/2016    There were no vitals filed for this visit.   Subjective Assessment - 07/21/19 1805    Subjective Pateint is here with her mom today    Currently in Pain?  No/denies    Pain Score 0-No pain    Pain Onset In the past 7 days             Treatment: PROM to left ankle heel cord Instruction to patients Mom for stretching L ankle  Gait training  with loft strand crutch with cues for keeping left knee in extension x 100 feet Patient performed with instruction, verbal cues, tactile cues of therapist: goal: increase tissue extensibility, promote proper posture, improve mobility                        PT Education - 07/21/19 1805    Education provided Yes    Education Details stretching left ankle    Person(s) Educated Patient;Parent(s)    Methods Explanation;Demonstration;Tactile cues;Verbal cues            PT Short Term Goals - 04/25/19 1627      PT SHORT TERM GOAL #1   Title Pt will be independent with initial HEP in order to indicate improved strength and decreased fall risk.    Baseline initial HEP administered on eval 03/02/2019; 04/25/19: "not as much as should."    Time 6    Period Weeks    Status Partially Met  Target Date 04/13/19             PT Long Term Goals - 06/13/19 1516      PT LONG TERM GOAL #1   Title Pt will be independent with final HEP in order to indicate improved strength and decreased fall risk.    Baseline initial HEP administered on eval 03/02/19; 04/25/19: "not as much as a should."    Time 12    Period Weeks    Status On-going    Target Date 08/17/19      PT LONG TERM GOAL #2   Title Pt will decrease 5TSTS to less than 10 seconds in order to demonstrate clinically significant improvement in LE strength and functional mobility.    Baseline 03/02/2019: 17 seconds without UE support, dependent on RLE, very little weight shift to LLE; 04/25/19: 7.7s, no UE, decreased weight shifting to LLE;    Time 12    Period Weeks    Status Achieved      PT LONG TERM GOAL #3   Title Pt will perform 6MWT > 1039f with LRAD at mod I level in order to indicate safe community/leisure negotiation.     Baseline 03/02/2019 5324f 04/25/19: 1000'    Time 12    Period Weeks    Status Partially Met    Target Date 08/17/19      PT LONG TERM GOAL #4   Title The patient will demonstrate at least 1/2 grade improvement in LE MMT grade to improve ability to perform functional activities.    Baseline see eval for details 03/02/2019; 04/25/19: see visit note, significant improvement, still lacking L anikle dorsiflexion, eversion, or inversion    Time 12    Period Weeks    Status Partially Met    Target Date 08/17/19      PT LONG TERM GOAL #5   Title Pt will ambulate >1 m/s with LRAD mod I to indicate gait velocity of community ambulator.    Baseline 03/02/19: self selected: .57 m/s, fastest .7255mwith quad cane; 04/25/19: Self-selected: 10.8s = 0.93 m/s; Fastest: 8.8s = 1.14 m/s, performed with no AD    Time 12    Period Weeks    Status Partially Met    Target Date 08/17/19                 Plan - 07/21/19 1806    Clinical Impression Statement  Pt was able to perform all exercises today with CGA.. [place] Kitchentients mom was instructed in stretching for HEP.  Pt requires verbal, visual and tactile cues during exercise in order to complete tasks with proper form and technique, as well as to stay on task.  Pt would continue to benefit from skilled PT services in order to further strengthen LE's, improve static and dynamic balance, and improve coordination in order to increase functional mobility and decrease risk of falls   Personal Factors and Comorbidities Behavior Pattern    Examination-Activity Limitations Bathing;Dressing;Sit;Transfers;Sleep;Bed Mobility;Bend;Caring for Others;Carry;Toileting;Reach Overhead;Stand;Locomotion Level;Stairs;Squat;Lift;Hygiene/Grooming    Examination-Participation Restrictions Church;Interpersonal Relationship;Personal Finances;Yard Work;Cleaning;Laundry;School;Community Activity;Medication Management;Driving;Meal Prep;Shop    Rehab Potential Good    Clinical Impairments  Affecting Rehab Potential unsure of severity of cognitive/psych deficits    PT Frequency 2x / week    PT Duration 12 weeks    PT Treatment/Interventions ADLs/Self Care Home Management;DME Instruction;Gait training;Stair training;Functional mobility training;Therapeutic activities;Therapeutic exercise;Balance training;Neuromuscular re-education;Cognitive remediation;Patient/family education;Vestibular;Energy conservation;Cryotherapy;Ultrasound;Electrical Stimulation;Fluidtherapy;Iontophoresis 4mg50m Dexamethasone;Canalith Repostioning;Moist Heat;Traction;Passive range of motion;Manual techniques;Joint Manipulations;Spinal Manipulations;Splinting;Taping;Scar mobilization;Wheelchair mobility training;Prosthetic Training;Orthotic Fit/Training;Dry  needling;Visual/perceptual remediation/compensation;Aquatic Therapy    PT Next Visit Plan continue with LE strengthening, NME, and motor control activities    PT Home Exercise Plan Access Code: [email or web address]    Consulted and Agree with Plan of Care Patient           Patient will benefit from skilled therapeutic intervention in order to improve the following deficits and impairments:  Decreased activity tolerance, Decreased balance, Decreased cognition, Decreased endurance, Decreased knowledge of use of DME, Decreased mobility, Decreased strength, Impaired perceived functional ability, Postural dysfunction, Abnormal gait, Difficulty walking, Impaired tone, Decreased range of motion, Decreased coordination, Impaired UE functional use, Pain  Visit Diagnosis: Other lack of coordination  Right middle cerebral artery stroke (HCC)  Other abnormalities of gait and mobility  Acute pain of left shoulder  Frontal lobe and executive function deficit  Ischemic stroke diagnosed during current admission [facility])  Muscle weakness (generalized)     Problem List Patient Active Problem List   Diagnosis Date Noted  . Tracheostomy status ([place]) 07/05/2019  . Major  depressive disorder, recurrent episode, severe ([place]) 06/21/2019  . Postcoital UTI 05/24/2019  . Spastic hemiparesis ([place])   . Vascular headache   . Mood disorder in conditions classified elsewhere   . 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 Polysubstance dependence in early, early partial, sustained full, or sustained partial remission ([place])   . Dyslipidemia   . Ischemic stroke diagnosed during current admission ([place]) 02/13/2019  . MDD (major depressive disorder), recurrent episode, moderate ([place]) 02/10/2019  . CVA (cerebral vascular accident) ([place]) 02/07/2019  . TBI (traumatic brain injury) ([place]) 05/01/2016  . 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 Elevated troponin     Alanson Puls, [place] DPT 07/21/2019, 6:08 PM  [place] MAIN [facility] SERVICES [address] [place], [place], [address] Phone: [phone]   Fax:  [phone]  Name: Jane Watkins MRN: [id number] Date of Birth: 1995/02/12

## 2019-07-21 NOTE — Therapy (Signed)
[place] MAIN [facility] SERVICES [address] [place], [place], [address] Phone: [phone]   Fax:  [phone]  Occupational Therapy Treatment  Patient Details  Name: Jane Watkins MRN: [id number] Date of Birth: 05/10/1995 Referring Provider (OT): Dr. [name]   Encounter Date: 07/21/2019   OT End of Session - 07/21/19 1618    Visit Number 27    Number of Visits 79    Date for OT Re-Evaluation 08/29/19    Authorization Type Progress report period starting 06/16/2019    OT Start Time 1145    OT Stop Time 1225    OT Time Calculation (min) 40 min    Activity Tolerance Patient tolerated treatment well    Behavior During Therapy Flat affect           Past Medical History:  Diagnosis Date  . AKI (acute kidney injury) ([place]) 2018   "from overdose"  . Anxiety   . Chlamydia   . Daily headache   . Depression   . Drug overdose 04/2016   [name] 05/01/2016  . GERD (gastroesophageal reflux disease)    "when I was younger; gone now" (09/21/2017)  . IV drug abuse ([place]) 09/20/2017  . Migraine    "q couple weeks" (09/21/2017)  . Opioid abuse ([place])   . Overdose   . Stroke [facility])     Past Surgical History:  Procedure Laterality Date  . APPENDECTOMY  04/2013  . I & D EXTREMITY Left 09/20/2017   Procedure: IRRIGATION AND DEBRIDEMENT LEFT ARM;  Surgeon: [name], MD;  Location: [place];  Service: Orthopedics;  Laterality: Left;  . TEE WITHOUT CARDIOVERSION N/A 02/10/2019   Procedure: TRANSESOPHAGEAL ECHOCARDIOGRAM (TEE);  Surgeon: [name], MD;  Location: [facility];  Service: Cardiovascular;  Laterality: N/A;  Polysubstance abuser  . TONGUE SURGERY  ~ 2007   "related to lisp"  . TRACHEOSTOMY  04/2016   [name] 05/01/2016    There were no vitals filed for this visit.   Subjective Assessment - 07/21/19 1617    Subjective  Pt. reports fatigue from medication changes.    Patient is accompanied by: Family member    Pertinent History Per  chart. Pt. is a 24 y.o.female with a history of IV drug use, polysubstance as well as tobacco abuse, anxiety. Pt. presented to [facility] on 02/07/2019 after being found unresponsive.  EMS contacted patient received Narcan with some improvement in mental status.  Admission labs with WBC 20,700, potassium 5.2, BUN 25, creatinine 0.67, urine drug screen positive cocaine, marijuana and benzodiazepines, alcohol level less than 10, SARS coronavirus negative.  Cranial CT scan showed areas of indistinct low-density in the right basal ganglia with mass-effect on the right lateral ventricle.  Patient did not receive TPA.  MRI showed acute infarction of right MCA territory, affecting the basal ganglia and affecting the cortical and subcortical brain in a largely watershed distribution. Pt. had a previous inpatient rehab admission 05/01/2016 to 05/09/2016 for hypoxic encephalopathy after drug overdose requiring tracheostomy, and was later decannulated. Pt. resides with her boyfriend in an apartment. 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    Currently in Pain? No/denies          OT TREATMENT    Neuro muscular re-education:  Pt. tolerated weightbearing, and proprioceptive input through the LUE, and hand. Pt. required assist to prepare, and position her LUE, and hand for weightbearing, and proprioceptive input. The tone responded well to proprioception. Education was provided  to the pt.'s mother about positioning the LUE  For ROM.  Therapeutic Exercise:  Pt. tolerated AAROM in all joint ranges of the LUE, and hand. Pt. tolerated PROM to the left hand MPs, PIPS, and DIPs secondary to increased flexor tone, and tightness. Education was provided to the patient's mother about ROM, and safely stretching the LUE, and hand.  Pt.'s mother attended the treatment with the pt. Pt. presents with increased fatigue today with intermittently closing her eyes during the session. Pt. reports recently  starting a new medication to help her tone. Pt. continues to present with increased flexor tone, and tightness through her LUE, and hand with increased tightness in the left 3rd, and 4th digits.  Pt. responded well to weightbearing, inhibitory techniques, and proprioceptive input through her left hand. Pt.'s mother was able to demonstrate PROM, stretching, and positioning the LUE for weightbearing.                         OT Education - 07/21/19 1617    Education Details ROM    Person(s) Educated Patient    Methods Explanation;Demonstration;Tactile cues;Verbal cues;Handout    Comprehension Verbalized understanding;Returned demonstration;Need further instruction               OT Long Term Goals - 06/13/19 1753      OT LONG TERM GOAL #2   Title Pt. will increase active shoulder abduction by 20 degrees in preparation to be able to wash her hair.    Baseline Shoulder abduction: 108 Pt. is able to rinse her hair not wash it. 04/25/2019: shoulder abduction:95. Pt. is unable to use her LUE to wash her hair. Eval: Active shoulder abduction 56 degrees.    Time 12    Period Weeks    Status Partially Met    Target Date 08/29/19      OT LONG TERM GOAL #6   Title Pt. will assume a mature tripod grasp on a pen while writing her name wiht 100% legibility.    Baseline Pt. conitnues to be unable to assume a mature tripod grasp on a pen when drawing a line.    Time 12    Period Weeks    Status On-going    Target Date 08/29/19      OT LONG TERM GOAL #7   Title Pt. will independently type a 3 sentence email message efficiently with 100% accuracy    Baseline Pt. is unable.    Time 12    Period Weeks    Status On-going      OT LONG TERM GOAL #8   Title Pt. will improve left hand [facility] skills in order to independentlymanipulate small objects needed during ADLs, and IADLs,    Baseline Left hand [facility] skills are limited.    Time 12    Period Weeks    Status On-going      OT  LONG TERM GOAL  #9   TITLE Pt. will use her left hand to independently handle, and use cooking, and baking utensils.    Baseline Pt. conitnues to have difficulty    Time 12    Period Weeks    Status On-going    Target Date 08/29/19                 Plan - 07/21/19 1618    Clinical Impression Statement Pt.'s mother attended the treatment with the pt. Pt. presents with increased fatigue today with intermittently closing her eyes  during the session. Pt. reports recently starting a new medication to help her tone. Pt. continues to present with increased flexor tone, and tightness through her LUE, and hand with increased tightness in the left 3rd, and 4th digits.  Pt. responded well to weightbearing, inhibitory techniques, and proprioceptive input through her left hand. Pt.'s mother was able to demonstrate PROM, stretching, and positioning the LUE for weightbearing.   OT Occupational Profile and History Detailed Assessment- Review of Records and additional review of physical, cognitive, psychosocial history related to current functional performance    Occupational performance deficits (Please refer to evaluation for details): ADL's;IADL's    Body Structure / Function / Physical Skills ADL;IADL;Coordination;Endurance;UE functional use;Decreased knowledge of precautions;Dexterity;FMC;ROM;Tone;Strength    Rehab Potential Good    Clinical Decision Making Several treatment options, min-mod task modification necessary    Comorbidities Affecting Occupational Performance: May have comorbidities impacting occupational performance    Modification or Assistance to Complete Evaluation  Min-Moderate modification of tasks or assist with assess necessary to complete eval    OT Frequency 2x / week    OT Duration 12 weeks    OT Treatment/Interventions Self-care/ADL training;DME and/or AE instruction;Energy conservation;Therapeutic activities;Patient/family education;Therapeutic exercise;Neuromuscular education     Consulted and Agree with Plan of Care Patient           Patient will benefit from skilled therapeutic intervention in order to improve the following deficits and impairments:   Body Structure / Function / Physical Skills: ADL, IADL, Coordination, Endurance, UE functional use, Decreased knowledge of precautions, Dexterity, FMC, ROM, Tone, Strength       Visit Diagnosis: Muscle weakness (generalized)  Other lack of coordination    Problem List Patient Active Problem List   Diagnosis Date Noted  . Tracheostomy status ([place]) 07/05/2019  . Major depressive disorder, recurrent episode, severe ([place]) 06/21/2019  . Postcoital UTI 05/24/2019  . Spastic hemiparesis ([place])   . Vascular headache   . Mood disorder in conditions classified elsewhere   . 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 Polysubstance dependence in early, early partial, sustained full, or sustained partial remission ([place])   . Dyslipidemia   . Ischemic stroke diagnosed during current admission ([place]) 02/13/2019  . MDD (major depressive disorder), recurrent episode, moderate ([place]) 02/10/2019  . CVA (cerebral vascular accident) ([place]) 02/07/2019  . TBI (traumatic brain injury) ([place]) 05/01/2016  . 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 Elevated troponin     Harrel Carina, MS, OTR/L 07/21/2019, 4:21 PM  [place] MAIN [facility] SERVICES [address] [place], [place], [address] Phone: [phone]   Fax:  [phone]  Name: Jane Watkins MRN: [id number] Date of Birth: [birth year]

## 2019-07-25 ENCOUNTER — Ambulatory Visit: Payer: 59 | Admitting: Occupational Therapy

## 2019-07-25 ENCOUNTER — Encounter: Payer: Self-pay | Admitting: Physical Therapy

## 2019-07-25 ENCOUNTER — Other Ambulatory Visit: Payer: Self-pay

## 2019-07-25 ENCOUNTER — Encounter: Payer: Self-pay | Admitting: Occupational Therapy

## 2019-07-25 ENCOUNTER — Ambulatory Visit: Payer: 59 | Admitting: Physical Therapy

## 2019-07-25 DIAGNOSIS — R2689 Other abnormalities of gait and mobility: Secondary | ICD-10-CM

## 2019-07-25 DIAGNOSIS — M6281 Muscle weakness (generalized): Secondary | ICD-10-CM | POA: Diagnosis not present

## 2019-07-25 DIAGNOSIS — I63511 Cerebral infarction due to unspecified occlusion or stenosis of right middle cerebral artery: Secondary | ICD-10-CM

## 2019-07-25 DIAGNOSIS — I639 Cerebral infarction, unspecified: Secondary | ICD-10-CM

## 2019-07-25 DIAGNOSIS — R278 Other lack of coordination: Secondary | ICD-10-CM

## 2019-07-25 DIAGNOSIS — R41844 Frontal lobe and executive function deficit: Secondary | ICD-10-CM

## 2019-07-25 DIAGNOSIS — M25512 Pain in left shoulder: Secondary | ICD-10-CM

## 2019-07-25 NOTE — Therapy (Signed)
[place] MAIN [facility] SERVICES [address] [place], [place], [address] Phone: [phone]   Fax:  [phone]  Occupational Therapy Treatment  Patient Details  Name: Jane Watkins MRN: [id number] Date of Birth: [birth year] Referring Provider (OT): Dr. [name]   Encounter Date: 07/25/2019   OT End of Session - 07/25/19 1204    Visit Number 28    Number of Visits 48    Date for OT Re-Evaluation 08/29/19    Authorization Type Progress report period starting 06/16/2019    OT Start Time 1107    OT Stop Time 1146    OT Time Calculation (min) 39 min    Activity Tolerance Patient tolerated treatment well    Behavior During Therapy [facility] for tasks assessed/performed           Past Medical History:  Diagnosis Date  . AKI (acute kidney injury) ([place]) 2018   "from overdose"  . Anxiety   . Chlamydia   . Daily headache   . Depression   . Drug overdose 04/2016   [name] 05/01/2016  . GERD (gastroesophageal reflux disease)    "when I was younger; gone now" (09/21/2017)  . IV drug abuse ([place]) 09/20/2017  . Migraine    "q couple weeks" (09/21/2017)  . Opioid abuse ([place])   . Overdose   . Stroke [facility])     Past Surgical History:  Procedure Laterality Date  . APPENDECTOMY  04/2013  . I & D EXTREMITY Left 09/20/2017   Procedure: IRRIGATION AND DEBRIDEMENT LEFT ARM;  Surgeon: [name], MD;  Location: [place];  Service: Orthopedics;  Laterality: Left;  . TEE WITHOUT CARDIOVERSION N/A 02/10/2019   Procedure: TRANSESOPHAGEAL ECHOCARDIOGRAM (TEE);  Surgeon: [name], MD;  Location: [facility];  Service: Cardiovascular;  Laterality: N/A;  Polysubstance abuser  . TONGUE SURGERY  ~ 2007   "related to lisp"  . TRACHEOSTOMY  04/2016   [name] 05/01/2016    There were no vitals filed for this visit.   Subjective Assessment - 07/25/19 1201    Subjective  Pt reports she is feeling much better today than the last time she was here.  Patient smiling and  in good spirits, reports she is planning to spend some time outdoors today with the weather nice.  She states she had a counseling session last week but will likely be switching to another more experienced person.    Pertinent History Per chart. Pt. is a 24 y.o.female with a history of IV drug use, polysubstance as well as tobacco abuse, anxiety. Pt. presented to [facility] on 02/07/2019 after being found unresponsive.  EMS contacted patient received Narcan with some improvement in mental status.  Admission labs with WBC 20,700, potassium 5.2, BUN 25, creatinine 0.67, urine drug screen positive cocaine, marijuana and benzodiazepines, alcohol level less than 10, SARS coronavirus negative.  Cranial CT scan showed areas of indistinct low-density in the right basal ganglia with mass-effect on the right lateral ventricle.  Patient did not receive TPA.  MRI showed acute infarction of right MCA territory, affecting the basal ganglia and affecting the cortical and subcortical brain in a largely watershed distribution. Pt. had a previous inpatient rehab admission 05/01/2016 to 05/09/2016 for hypoxic encephalopathy after drug overdose requiring tracheostomy, and was later decannulated. Pt. resides with her boyfriend in an apartment. 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    Patient Stated Goals Patient reports she would like to be as independent as possible with  all her daily tasks.    Currently in Pain? No/denies    Pain Score 0-No pain            Patient arriving a few mins late, reports she is feeling much better and ready to work today, no fatigue.  Patient reports being able to drive herself to therapy now.  Patient requesting to work on coordination tasks this date.  Reports her mom came with her last session to see what kind of exercises she has been doing and is now helping her with her HEP on a consistent basis.  Patient denies any pain this date and reports her spasticity is not as  bad today.   Treatment: Assessment of current ROM and use of left hand, pt with limited finger ABD for index and MF.  Small finger tends to stay in ABD position all the time.  Difficulty with composite flexion as well as isolated finger movements on the left.   Patient seen for coordination tasks with use of left UE with use of ball pegs, pt unable to retrieve from container, if handed to pt then she can take the ball peg and place into grid.  If peg requires turning, pt has difficulty with isolated finger movement to turn object.  Patient able to remove all pegs and place into container with min difficulty, cues at times to weightbear before returning to task.   Patient seen for attempts to pick up small 1 inch scrabble like letters, difficulty flipping pieces to opposite side using left hand, able to pick up tiles with increased effort.  Worked towards using index and middle finger in isolation to [employer] into line of letters to make a word.  Multiple words completed this date.  Dropped 4 items on the floor today.   Response to tx: Patient very alert and participating actively in session today.  Patient demonstrates difficulty with coordination to pick up and manipulate small objects 1 inch in size or less.  Patient continues to demonstrate increased spasticity in left hand and arm however pt reports tightness is less this date.  She continues to work towards grasping, releasing and manipulation skills to improve left functional hand use.  Continue OT to maximize safety and independence in necessary daily tasks.                      OT Education - 07/25/19 1203    Education Details coordination tasks, hand skills, working towards isolated finger movements.    Person(s) Educated Patient    Methods Explanation;Demonstration;Tactile cues;Verbal cues;Handout    Comprehension Verbalized understanding;Returned demonstration;Need further instruction               OT  Long Term Goals - 06/13/19 1753      OT LONG TERM GOAL #2   Title Pt. will increase active shoulder abduction by 20 degrees in preparation to be able to wash her hair.    Baseline Shoulder abduction: 108 Pt. is able to rinse her hair not wash it. 04/25/2019: shoulder abduction:95. Pt. is unable to use her LUE to wash her hair. Eval: Active shoulder abduction 56 degrees.    Time 12    Period Weeks    Status Partially Met    Target Date 08/29/19      OT LONG TERM GOAL #6   Title Pt. will assume a mature tripod grasp on a pen while writing her name wiht 100% legibility.    Baseline Pt. conitnues to  be unable to assume a mature tripod grasp on a pen when drawing a line.    Time 12    Period Weeks    Status On-going    Target Date 08/29/19      OT LONG TERM GOAL #7   Title Pt. will independently type a 3 sentence email message efficiently with 100% accuracy    Baseline Pt. is unable.    Time 12    Period Weeks    Status On-going      OT LONG TERM GOAL #8   Title Pt. will improve left hand [facility] skills in order to independentlymanipulate small objects needed during ADLs, and IADLs,    Baseline Left hand [facility] skills are limited.    Time 12    Period Weeks    Status On-going      OT LONG TERM GOAL  #9   TITLE Pt. will use her left hand to independently handle, and use cooking, and baking utensils.    Baseline Pt. conitnues to have difficulty    Time 12    Period Weeks    Status On-going    Target Date 08/29/19                 Plan - 07/25/19 1205    Clinical Impression Statement Patient very alert and participating actively in session today.  Patient demonstrates difficulty with coordination to pick up and manipulate small objects 1 inch in size or less.  Patient continues to demonstrate increased spasticity in left hand and arm however pt reports tightness is less this date.  She continues to work towards grasping, releasing and manipulation skills to improve left functional  hand use.  Continue OT to maximize safety and independence in necessary daily tasks.    OT Occupational Profile and History Detailed Assessment- Review of Records and additional review of physical, cognitive, psychosocial history related to current functional performance    Occupational performance deficits (Please refer to evaluation for details): ADL's;IADL's    Body Structure / Function / Physical Skills ADL;IADL;Coordination;Endurance;UE functional use;Decreased knowledge of precautions;Dexterity;FMC;ROM;Tone;Strength    Rehab Potential Good    Clinical Decision Making Several treatment options, min-mod task modification necessary    Comorbidities Affecting Occupational Performance: May have comorbidities impacting occupational performance    Modification or Assistance to Complete Evaluation  Min-Moderate modification of tasks or assist with assess necessary to complete eval    OT Frequency 2x / week    OT Duration 12 weeks    OT Treatment/Interventions Self-care/ADL training;DME and/or AE instruction;Energy conservation;Therapeutic activities;Patient/family education;Therapeutic exercise;Neuromuscular education    Consulted and Agree with Plan of Care Patient           Patient will benefit from skilled therapeutic intervention in order to improve the following deficits and impairments:   Body Structure / Function / Physical Skills: ADL, IADL, Coordination, Endurance, UE functional use, Decreased knowledge of precautions, Dexterity, FMC, ROM, Tone, Strength       Visit Diagnosis: Muscle weakness (generalized)  Other lack of coordination  Right middle cerebral artery stroke [facility])    Problem List Patient Active Problem List   Diagnosis Date Noted  . Tracheostomy status ([place]) 07/05/2019  . Major depressive disorder, recurrent episode, severe ([place]) 06/21/2019  . Postcoital UTI 05/24/2019  . Spastic hemiparesis ([place])   . Vascular headache   . Mood disorder in conditions  classified elsewhere   . 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 Polysubstance dependence in early, early partial,  sustained full, or sustained partial remission ([place])   . Dyslipidemia   . Ischemic stroke diagnosed during current admission ([place]) 02/13/2019  . MDD (major depressive disorder), recurrent episode, moderate ([place]) 02/10/2019  . CVA (cerebral vascular accident) ([place]) 02/07/2019  . TBI (traumatic brain injury) ([place]) 05/01/2016  . 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 Elevated troponin    Achilles Dunk, OTR/L, CLT  Tip Atienza 07/25/2019, 12:22 PM  [place] MAIN [facility] SERVICES [address] [place], [place], [address] Phone: [phone]   Fax:  [phone]  Name: Jane Watkins MRN: [id number] Date of Birth: 28-Apr-1995

## 2019-07-25 NOTE — Therapy (Signed)
[place] MAIN [facility] SERVICES [address] [place], [place], [address] Phone: [phone]   Fax:  [phone]  Physical Therapy Treatment  Patient Details  Name: Jane Watkins MRN: [id number] Date of Birth: 10/12/1995 Referring Provider (PT): Dr. Earnest Conroy. [name]   Encounter Date: 07/25/2019   PT End of Session - 07/25/19 1352    Visit Number 27    Number of Visits 24    Date for PT Re-Evaluation 08/17/19    Authorization Type [employer], 60 visit limit per discipline    PT Start Time 1145    PT Stop Time 1230    PT Time Calculation (min) 45 min    Equipment Utilized During Treatment Gait belt    Activity Tolerance Patient tolerated treatment well;No increased pain    Behavior During Therapy Flat affect           Past Medical History:  Diagnosis Date  . AKI (acute kidney injury) ([place]) 2018   "from overdose"  . Anxiety   . Chlamydia   . Daily headache   . Depression   . Drug overdose 04/2016   [name] 05/01/2016  . GERD (gastroesophageal reflux disease)    "when I was younger; gone now" (09/21/2017)  . IV drug abuse ([place]) 09/20/2017  . Migraine    "q couple weeks" (09/21/2017)  . Opioid abuse ([place])   . Overdose   . Stroke [facility])     Past Surgical History:  Procedure Laterality Date  . APPENDECTOMY  04/2013  . I & D EXTREMITY Left 09/20/2017   Procedure: IRRIGATION AND DEBRIDEMENT LEFT ARM;  Surgeon: [name], MD;  Location: [place];  Service: Orthopedics;  Laterality: Left;  . TEE WITHOUT CARDIOVERSION N/A 02/10/2019   Procedure: TRANSESOPHAGEAL ECHOCARDIOGRAM (TEE);  Surgeon: [name], MD;  Location: [facility];  Service: Cardiovascular;  Laterality: N/A;  Polysubstance abuser  . TONGUE SURGERY  ~ 2007   "related to lisp"  . TRACHEOSTOMY  04/2016   [name] 05/01/2016    There were no vitals filed for this visit.   Subjective Assessment - 07/25/19 1350    Subjective Pateint is doing better today. She is having her mom help  her with stretches.    Currently in Pain? No/denies    Pain Score 0-No pain    Pain Onset In the past 7 days          Treatment: Leg press 50 lbs x 20 x 3 bLE Nu-step x 5 mins L 3 Matrix x 5 reps fwd/ bwd 12. 5  PROM to left ankle x 30 sec x 3  Reviewed stretching hamstring and ankle for HEP Pt educated throughout session about proper posture and technique with exercises. Improved exercise technique, movement at target joints, use of target muscles after min to mod verbal, visual, tactile cues.                           PT Education - 07/25/19 1351    Education provided Yes    Education Details stretching L ankle    Person(s) Educated Patient    Methods Demonstration;Tactile cues;Verbal cues    Comprehension Verbal cues required;Tactile cues required;Need further instruction            PT Short Term Goals - 04/25/19 1627      PT SHORT TERM GOAL #1   Title Pt will be independent with initial HEP in order to indicate improved strength and decreased  fall risk.    Baseline initial HEP administered on eval 03/02/2019; 04/25/19: "not as much as should."    Time 6    Period Weeks    Status Partially Met    Target Date 04/13/19             PT Long Term Goals - 06/13/19 1516      PT LONG TERM GOAL #1   Title Pt will be independent with final HEP in order to indicate improved strength and decreased fall risk.    Baseline initial HEP administered on eval 03/02/19; 04/25/19: "not as much as a should."    Time 12    Period Weeks    Status On-going    Target Date 08/17/19      PT LONG TERM GOAL #2   Title Pt will decrease 5TSTS to less than 10 seconds in order to demonstrate clinically significant improvement in LE strength and functional mobility.    Baseline 03/02/2019: 17 seconds without UE support, dependent on RLE, very little weight shift to LLE; 04/25/19: 7.7s, no UE, decreased weight shifting to LLE;    Time 12    Period Weeks    Status Achieved       PT LONG TERM GOAL #3   Title Pt will perform 6MWT > 1069f with LRAD at mod I level in order to indicate safe community/leisure negotiation.    Baseline 03/02/2019 5321f 04/25/19: 1000'    Time 12    Period Weeks    Status Partially Met    Target Date 08/17/19      PT LONG TERM GOAL #4   Title The patient will demonstrate at least 1/2 grade improvement in LE MMT grade to improve ability to perform functional activities.    Baseline see eval for details 03/02/2019; 04/25/19: see visit note, significant improvement, still lacking L anikle dorsiflexion, eversion, or inversion    Time 12    Period Weeks    Status Partially Met    Target Date 08/17/19      PT LONG TERM GOAL #5   Title Pt will ambulate >1 m/s with LRAD mod I to indicate gait velocity of community ambulator.    Baseline 03/02/19: self selected: .57 m/s, fastest .7252mwith quad cane; 04/25/19: Self-selected: 10.8s = 0.93 m/s; Fastest: 8.8s = 1.14 m/s, performed with no AD    Time 12    Period Weeks    Status Partially Met    Target Date 08/17/19                 Plan - 07/25/19 1352    Clinical Impression Statement Patients L ankle tone is better today.  She is able to ambulate without AD and with less tone in her left ankle. She performs balance exercises for dynamic standing balance with CGA and cues for left knee extension during stance phase of gait. She will continue to benefit from skilled PT to improve strength and safety    Personal Factors and Comorbidities Behavior Pattern    Examination-Activity Limitations Bathing;Dressing;Sit;Transfers;Sleep;Bed Mobility;Bend;Caring for Others;Carry;Toileting;Reach Overhead;Stand;Locomotion Level;Stairs;Squat;Lift;Hygiene/Grooming    Examination-Participation Restrictions Church;Interpersonal Relationship;Personal Finances;Yard Work;Cleaning;Laundry;School;Community Activity;Medication Management;Driving;Meal Prep;Shop    Rehab Potential Good    Clinical Impairments  Affecting Rehab Potential unsure of severity of cognitive/psych deficits    PT Frequency 2x / week    PT Duration 12 weeks    PT Treatment/Interventions ADLs/Self Care Home Management;DME Instruction;Gait training;Stair training;Functional mobility training;Therapeutic activities;Therapeutic exercise;Balance training;Neuromuscular re-education;Cognitive remediation;Patient/family education;Vestibular;Energy conservation;Cryotherapy;Ultrasound;Electrical Stimulation;Fluidtherapy;Iontophoresis 4mg43m  Dexamethasone;Canalith Repostioning;Moist Heat;Traction;Passive range of motion;Manual techniques;Joint Manipulations;Spinal Manipulations;Splinting;Taping;Scar mobilization;Wheelchair mobility training;Prosthetic Training;Orthotic Fit/Training;Dry needling;Visual/perceptual remediation/compensation;Aquatic Therapy    PT Next Visit Plan continue with LE strengthening, NME, and motor control activities    PT Home Exercise Plan Access Code: [email or web address]    Consulted and Agree with Plan of Care Patient           Patient will benefit from skilled therapeutic intervention in order to improve the following deficits and impairments:  Decreased activity tolerance, Decreased balance, Decreased cognition, Decreased endurance, Decreased knowledge of use of DME, Decreased mobility, Decreased strength, Impaired perceived functional ability, Postural dysfunction, Abnormal gait, Difficulty walking, Impaired tone, Decreased range of motion, Decreased coordination, Impaired UE functional use, Pain  Visit Diagnosis: Muscle weakness (generalized)  Other lack of coordination  Right middle cerebral artery stroke (HCC)  Other abnormalities of gait and mobility  Acute pain of left shoulder  Ischemic stroke diagnosed during current admission ([place])  Frontal lobe and executive function deficit     Problem List Patient Active Problem List   Diagnosis Date Noted  . Tracheostomy status ([place]) 07/05/2019  . Major  depressive disorder, recurrent episode, severe ([place]) 06/21/2019  . Postcoital UTI 05/24/2019  . Spastic hemiparesis ([place])   . Vascular headache   . Mood disorder in conditions classified elsewhere   . Right middle cerebral artery stroke ([place]) 02/16/2019  . Polysubstance dependence in early, early partial, sustained full, or sustained partial remission ([place])   . Dyslipidemia   . Ischemic stroke diagnosed during current admission ([place]) 02/13/2019  . MDD (major depressive disorder), recurrent episode, moderate ([place]) 02/10/2019  . CVA (cerebral vascular accident) ([place]) 02/07/2019  . TBI (traumatic brain injury) ([place]) 05/01/2016  . 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 Elevated troponin     Arelia Sneddon S,PT DPT 07/25/2019, 1:53 PM  [place] MAIN [facility] SERVICES [address] [place], [place], [address] Phone: [phone]   Fax:  [phone]  Name: Jane Watkins MRN: [id number] Date of Birth: [birth year]

## 2019-07-28 ENCOUNTER — Other Ambulatory Visit: Payer: Self-pay | Admitting: Physical Medicine and Rehabilitation

## 2019-07-28 ENCOUNTER — Encounter: Payer: Self-pay | Admitting: Physical Therapy

## 2019-07-28 ENCOUNTER — Encounter: Payer: Self-pay | Admitting: Occupational Therapy

## 2019-07-28 ENCOUNTER — Ambulatory Visit: Payer: 59 | Admitting: Physical Therapy

## 2019-07-28 ENCOUNTER — Ambulatory Visit: Payer: 59 | Admitting: Occupational Therapy

## 2019-07-28 ENCOUNTER — Other Ambulatory Visit: Payer: Self-pay

## 2019-07-28 DIAGNOSIS — M6281 Muscle weakness (generalized): Secondary | ICD-10-CM

## 2019-07-28 DIAGNOSIS — M25512 Pain in left shoulder: Secondary | ICD-10-CM

## 2019-07-28 DIAGNOSIS — I63511 Cerebral infarction due to unspecified occlusion or stenosis of right middle cerebral artery: Secondary | ICD-10-CM

## 2019-07-28 DIAGNOSIS — R2689 Other abnormalities of gait and mobility: Secondary | ICD-10-CM

## 2019-07-28 DIAGNOSIS — R41844 Frontal lobe and executive function deficit: Secondary | ICD-10-CM

## 2019-07-28 DIAGNOSIS — R278 Other lack of coordination: Secondary | ICD-10-CM

## 2019-07-28 DIAGNOSIS — I639 Cerebral infarction, unspecified: Secondary | ICD-10-CM

## 2019-07-28 NOTE — Therapy (Signed)
Jane Watkins MAIN [facility] SERVICES [address] [place], [place], [address] Phone: [phone]   Fax:  [phone]  Occupational Therapy Treatment  Patient Details  Name: Jane Watkins MRN: [id number] Date of Birth: Jan 26, 1996 Referring Provider (OT): Dr. [name]   Encounter Date: 07/28/2019   OT End of Session - 07/28/19 1509    Visit Number 29    Number of Visits 48    Date for OT Re-Evaluation 08/29/19    Authorization Type Progress report period starting 06/16/2019    OT Start Time 1103    OT Stop Time 1145    OT Time Calculation (min) 42 min    Activity Tolerance Patient tolerated treatment well    Behavior During Therapy [facility] for tasks assessed/performed           Past Medical History:  Diagnosis Date  . AKI (acute kidney injury) ([place]) 2018   "from overdose"  . Anxiety   . Chlamydia   . Daily headache   . Depression   . Drug overdose 04/2016   [name] 05/01/2016  . GERD (gastroesophageal reflux disease)    "when I was younger; gone now" (09/21/2017)  . IV drug abuse ([place]) 09/20/2017  . Migraine    "q couple weeks" (09/21/2017)  . Opioid abuse ([place])   . Overdose   . Stroke [facility])     Past Surgical History:  Procedure Laterality Date  . APPENDECTOMY  04/2013  . I & D EXTREMITY Left 09/20/2017   Procedure: IRRIGATION AND DEBRIDEMENT LEFT ARM;  Surgeon: [name], MD;  Location: [place];  Service: Orthopedics;  Laterality: Left;  . TEE WITHOUT CARDIOVERSION N/A 02/10/2019   Procedure: TRANSESOPHAGEAL ECHOCARDIOGRAM (TEE);  Surgeon: [name], MD;  Location: [facility];  Service: Cardiovascular;  Laterality: N/A;  Polysubstance abuser  . TONGUE SURGERY  ~ 2007   "related to lisp"  . TRACHEOSTOMY  04/2016   [name] 05/01/2016    There were no vitals filed for this visit.   Subjective Assessment - 07/28/19 1507    Subjective  Patient reports she is doing well, excited about getting together with her Dad to celebrate  father's day and is hoping her brother will come along with her to the gathering.  Currently living with her mom and reports they tried last night to reorganize her room to make things more efficient with getting ready for the day.    Pertinent History Per chart. Pt. is a 24 y.o.female with a history of IV drug use, polysubstance as well as tobacco abuse, anxiety. Pt. presented to [facility] on 02/07/2019 after being found unresponsive.  EMS contacted patient received Narcan with some improvement in mental status.  Admission labs with WBC 20,700, potassium 5.2, BUN 25, creatinine 0.67, urine drug screen positive cocaine, marijuana and benzodiazepines, alcohol level less than 10, SARS coronavirus negative.  Cranial CT scan showed areas of indistinct low-density in the right basal ganglia with mass-effect on the right lateral ventricle.  Patient did not receive TPA.  MRI showed acute infarction of right MCA territory, affecting the basal ganglia and affecting the cortical and subcortical brain in a largely watershed distribution. Pt. had a previous inpatient rehab admission 05/01/2016 to 05/09/2016 for hypoxic encephalopathy after drug overdose requiring tracheostomy, and was later decannulated. Pt. resides with her boyfriend in an apartment. 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    Patient Stated Goals Patient reports she would like to be as independent as possible  with all her daily tasks.    Currently in Pain? No/denies    Pain Score 0-No pain           Therapeutic Exercise: Patient seen this date for focus on reaching tasks from seated and standing positions with use of SAEBO tower 4 levels with looped balls, using left hand around the loop for grasp and release moving balls from lowest to 3rd level from seated position, occasional breaks to weight bear onto left UE to promote finger extension.  Patient has to stand to complete the top level.  Cues and guiding as needed from  therapist especially for type of grasping patterns.  1st pattern with hand on looped area, 2nd pattern with full finger grasp on bottom of balls, increased spasticity in left thumb with this grasp.    Pt seen for PROM of left hand, wrist and digits followed by A/AAROM, therapist directed for promotion of finger extension and thumb.      Response to tx: Patient continues to demonstrate increased flexor spasticity in left UE which limits her functional use.  Increased tone noted in left thumb this date.  Patient working on a vriety of grasping patterns this date with thumb, index and middle finger as well as a whole hand grasp over larger ball surface.  Patient continues to work on ROM, prolonged stretching and attempts to use hand as an assist at home.  Continue to work towards normalizing tone for active use of hand in daily tasks.  Will plan for progress update next session, will update goals.                   OT Education - 07/28/19 1509    Education Details reaching tasks, ROM of hand    Person(s) Educated Patient    Methods Explanation;Demonstration;Tactile cues;Verbal cues;Handout    Comprehension Verbalized understanding;Returned demonstration;Need further instruction               OT Long Term Goals - 06/13/19 1753      OT LONG TERM GOAL #2   Title Pt. will increase active shoulder abduction by 20 degrees in preparation to be able to wash her hair.    Baseline Shoulder abduction: 108 Pt. is able to rinse her hair not wash it. 04/25/2019: shoulder abduction:95. Pt. is unable to use her LUE to wash her hair. Eval: Active shoulder abduction 56 degrees.    Time 12    Period Weeks    Status Partially Met    Target Date 08/29/19      OT LONG TERM GOAL #6   Title Pt. will assume a mature tripod grasp on a pen while writing her name wiht 100% legibility.    Baseline Pt. conitnues to be unable to assume a mature tripod grasp on a pen when drawing a line.    Time 12     Period Weeks    Status On-going    Target Date 08/29/19      OT LONG TERM GOAL #7   Title Pt. will independently type a 3 sentence email message efficiently with 100% accuracy    Baseline Pt. is unable.    Time 12    Period Weeks    Status On-going      OT LONG TERM GOAL #8   Title Pt. will improve left hand [facility] skills in order to independentlymanipulate small objects needed during ADLs, and IADLs,    Baseline Left hand [facility] skills are limited.  Time 12    Period Weeks    Status On-going      OT LONG TERM GOAL  #9   TITLE Pt. will use her left hand to independently handle, and use cooking, and baking utensils.    Baseline Pt. conitnues to have difficulty    Time 12    Period Weeks    Status On-going    Target Date 08/29/19                 Plan - 07/28/19 1510    Clinical Impression Statement Patient continues to demonstrate increased flexor spasticity in left UE which limits her functional use.  Increased tone noted in left thumb this date.  Patient working on a vriety of grasping patterns this date with thumb, index and middle finger as well as a whole hand grasp over larger ball surface.  Patient continues to work on ROM, prolonged stretching and attempts to use hand as an assist at home.  Continue to work towards normalizing tone for active use of hand in daily tasks.  Will plan for progress update next session, will update goals.    OT Occupational Profile and History Detailed Assessment- Review of Records and additional review of physical, cognitive, psychosocial history related to current functional performance    Occupational performance deficits (Please refer to evaluation for details): ADL's;IADL's    Body Structure / Function / Physical Skills ADL;IADL;Coordination;Endurance;UE functional use;Decreased knowledge of precautions;Dexterity;FMC;ROM;Tone;Strength    Rehab Potential Good    Clinical Decision Making Several treatment options, min-mod task modification  necessary    Comorbidities Affecting Occupational Performance: May have comorbidities impacting occupational performance    Modification or Assistance to Complete Evaluation  Min-Moderate modification of tasks or assist with assess necessary to complete eval    OT Frequency 2x / week    OT Duration 12 weeks    OT Treatment/Interventions Self-care/ADL training;DME and/or AE instruction;Energy conservation;Therapeutic activities;Patient/family education;Therapeutic exercise;Neuromuscular education    Consulted and Agree with Plan of Care Patient           Patient will benefit from skilled therapeutic intervention in order to improve the following deficits and impairments:   Body Structure / Function / Physical Skills: ADL, IADL, Coordination, Endurance, UE functional use, Decreased knowledge of precautions, Dexterity, FMC, ROM, Tone, Strength       Visit Diagnosis: Muscle weakness (generalized)  Other lack of coordination  Right middle cerebral artery stroke [facility])    Problem List Patient Active Problem List   Diagnosis Date Noted  . Tracheostomy status ([place]) 07/05/2019  . Major depressive disorder, recurrent episode, severe ([place]) 06/21/2019  . Postcoital UTI 05/24/2019  . Spastic hemiparesis ([place])   . Vascular headache   . Mood disorder in conditions classified elsewhere   . Right middle cerebral artery stroke ([place]) 02/16/2019  . Polysubstance dependence in early, early partial, sustained full, or sustained partial remission ([place])   . Dyslipidemia   . Ischemic stroke diagnosed during current admission ([place]) 02/13/2019  . MDD (major depressive disorder), recurrent episode, moderate ([place]) 02/10/2019  . CVA (cerebral vascular accident) ([place]) 02/07/2019  . TBI (traumatic brain injury) ([place]) 05/01/2016  . 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 Elevated troponin    Achilles Dunk, OTR/L, CLT  Aquilla Voiles 07/28/2019, 5:06 PM  [place]  MAIN [facility] SERVICES [address] [place], [place], [address] Phone: [phone]   Fax:  [phone]  Name: Jane Watkins MRN: [id number] Date of Birth: [birth year]

## 2019-07-28 NOTE — Therapy (Signed)
[place] MAIN [facility] SERVICES [address] [place], [place], [address] Phone: [phone]   Fax:  [phone]  Physical Therapy Treatment  Patient Details  Name: Jane Watkins MRN: [id number] Date of Birth: April 06, 1995 Referring Provider (PT): Dr. Earnest Conroy. [name]   Encounter Date: 07/28/2019   PT End of Session - 07/28/19 1155    Visit Number 28    Number of Visits 24    Date for PT Re-Evaluation 08/17/19    Authorization Type [employer], 60 visit limit per discipline    PT Start Time 1145    PT Stop Time 1230    PT Time Calculation (min) 45 min    Equipment Utilized During Treatment Gait belt    Activity Tolerance Patient tolerated treatment well;No increased pain    Behavior During Therapy Flat affect           Past Medical History:  Diagnosis Date  . AKI (acute kidney injury) ([place]) 2018   "from overdose"  . Anxiety   . Chlamydia   . Daily headache   . Depression   . Drug overdose 04/2016   [name] 05/01/2016  . GERD (gastroesophageal reflux disease)    "when I was younger; gone now" (09/21/2017)  . IV drug abuse ([place]) 09/20/2017  . Migraine    "q couple weeks" (09/21/2017)  . Opioid abuse ([place])   . Overdose   . Stroke [facility])     Past Surgical History:  Procedure Laterality Date  . APPENDECTOMY  04/2013  . I & D EXTREMITY Left 09/20/2017   Procedure: IRRIGATION AND DEBRIDEMENT LEFT ARM;  Surgeon: [name], MD;  Location: [place];  Service: Orthopedics;  Laterality: Left;  . TEE WITHOUT CARDIOVERSION N/A 02/10/2019   Procedure: TRANSESOPHAGEAL ECHOCARDIOGRAM (TEE);  Surgeon: [name], MD;  Location: [facility];  Service: Cardiovascular;  Laterality: N/A;  Polysubstance abuser  . TONGUE SURGERY  ~ 2007   "related to lisp"  . TRACHEOSTOMY  04/2016   [name] 05/01/2016    There were no vitals filed for this visit.   Subjective Assessment - 07/28/19 1343    Subjective Pateint is doing better today. She is having her mom help  her with stretches.    Patient is accompained by: --   significant other   Pertinent History Pt is 24 y.o. female presenting to hospital 02/07/19 initially,  MRI showing acute infarct R MCA territory affecting basal ganglia and affecting cortical and subcortical brain in a largely watershed distribution. After in hospital stay patient transitioned to CIR from 02/16/2019-03/01/2019.  PMH includes IV drug abuse, anxiety, depression, migraines, AKI, hypoxic ischemic encephalopathy 2018, opiate overdose 2018, h/o trach 2018, h/o I&D L UE. PLOF: lives with boyfriend in one story apartment, prior to CVA patient was independent, working.    Limitations Walking;Reading;Lifting;Writing;House hold activities;Standing    How long can you sit comfortably? NA    How long can you stand comfortably? 20 mins    How long can you walk comfortably? 20 mins    Diagnostic tests see MRI results    Patient Stated Goals Patient wants to maximize how much she can walk, get back to normal    Currently in Pain? No/denies    Pain Score 0-No pain    Pain Onset In the past 7 days           Treatment: Leg press 50 lbs x 20 x 3 bLE Octane fitness x 5 mins L 3 Matrix x 5 reps fwd/ bwd  12. 5  PROM to left ankle x 30 sec x 3  Reviewed stretching hamstring and ankle for HEP Pt educated throughout session about proper posture and technique with exercises. Improved exercise technique, movement at target joints, use of target muscles after min to mod verbal, visual, tactile cues.                          PT Education - 07/28/19 1153    Education provided Yes    Education Details stretching    Person(s) Educated Patient    Methods Explanation    Comprehension Verbalized understanding            PT Short Term Goals - 04/25/19 1627      PT SHORT TERM GOAL #1   Title Pt will be independent with initial HEP in order to indicate improved strength and decreased fall risk.    Baseline initial HEP  administered on eval 03/02/2019; 04/25/19: "not as much as should."    Time 6    Period Weeks    Status Partially Met    Target Date 04/13/19             PT Long Term Goals - 06/13/19 1516      PT LONG TERM GOAL #1   Title Pt will be independent with final HEP in order to indicate improved strength and decreased fall risk.    Baseline initial HEP administered on eval 03/02/19; 04/25/19: "not as much as a should."    Time 12    Period Weeks    Status On-going    Target Date 08/17/19      PT LONG TERM GOAL #2   Title Pt will decrease 5TSTS to less than 10 seconds in order to demonstrate clinically significant improvement in LE strength and functional mobility.    Baseline 03/02/2019: 17 seconds without UE support, dependent on RLE, very little weight shift to LLE; 04/25/19: 7.7s, no UE, decreased weight shifting to LLE;    Time 12    Period Weeks    Status Achieved      PT LONG TERM GOAL #3   Title Pt will perform 6MWT > 1015f with LRAD at mod I level in order to indicate safe community/leisure negotiation.    Baseline 03/02/2019 5368f 04/25/19: 1000'    Time 12    Period Weeks    Status Partially Met    Target Date 08/17/19      PT LONG TERM GOAL #4   Title The patient will demonstrate at least 1/2 grade improvement in LE MMT grade to improve ability to perform functional activities.    Baseline see eval for details 03/02/2019; 04/25/19: see visit note, significant improvement, still lacking L anikle dorsiflexion, eversion, or inversion    Time 12    Period Weeks    Status Partially Met    Target Date 08/17/19      PT LONG TERM GOAL #5   Title Pt will ambulate >1 m/s with LRAD mod I to indicate gait velocity of community ambulator.    Baseline 03/02/19: self selected: .57 m/s, fastest .7285mwith quad cane; 04/25/19: Self-selected: 10.8s = 0.93 m/s; Fastest: 8.8s = 1.14 m/s, performed with no AD    Time 12    Period Weeks    Status Partially Met    Target Date 08/17/19                  Plan -  07/28/19 1344    Clinical Impression Statement Patient required min verbal cueing during strengthening exercise to correct posture and form. Patient demonstrates ability to perform strengthening exercises with no pain but with moderate fatigue. Patient will continue to benefit from continued skilled therapy in order to improve dynamic standing balance and increase strength in order to decrease fall   Personal Factors and Comorbidities Behavior Pattern    Examination-Activity Limitations Bathing;Dressing;Sit;Transfers;Sleep;Bed Mobility;Bend;Caring for Others;Carry;Toileting;Reach Overhead;Stand;Locomotion Level;Stairs;Squat;Lift;Hygiene/Grooming    Examination-Participation Restrictions Church;Interpersonal Relationship;Personal Finances;Yard Work;Cleaning;Laundry;School;Community Activity;Medication Management;Driving;Meal Prep;Shop    Rehab Potential Good    Clinical Impairments Affecting Rehab Potential unsure of severity of cognitive/psych deficits    PT Frequency 2x / week    PT Duration 12 weeks    PT Treatment/Interventions ADLs/Self Care Home Management;DME Instruction;Gait training;Stair training;Functional mobility training;Therapeutic activities;Therapeutic exercise;Balance training;Neuromuscular re-education;Cognitive remediation;Patient/family education;Vestibular;Energy conservation;Cryotherapy;Ultrasound;Electrical Stimulation;Fluidtherapy;Iontophoresis 92m/ml Dexamethasone;Canalith Repostioning;Moist Heat;Traction;Passive range of motion;Manual techniques;Joint Manipulations;Spinal Manipulations;Splinting;Taping;Scar mobilization;Wheelchair mobility training;Prosthetic Training;Orthotic Fit/Training;Dry needling;Visual/perceptual remediation/compensation;Aquatic Therapy    PT Next Visit Plan continue with LE strengthening, NME, and motor control activities    PT Home Exercise Plan Access Code: [email or web address]    Consulted and Agree with Plan of Care Patient            Patient will benefit from skilled therapeutic intervention in order to improve the following deficits and impairments:  Decreased activity tolerance, Decreased balance, Decreased cognition, Decreased endurance, Decreased knowledge of use of DME, Decreased mobility, Decreased strength, Impaired perceived functional ability, Postural dysfunction, Abnormal gait, Difficulty walking, Impaired tone, Decreased range of motion, Decreased coordination, Impaired UE functional use, Pain  Visit Diagnosis: Muscle weakness (generalized)  Other lack of coordination  Right middle cerebral artery stroke (HCC)  Ischemic stroke diagnosed during current admission ([facility]  Acute pain of left shoulder  Other abnormalities of gait and mobility  Frontal lobe and executive function deficit     Problem List Patient Active Problem List   Diagnosis Date Noted  . Tracheostomy status ([place] 07/05/2019  . Major depressive disorder, recurrent episode, severe ([place] 06/21/2019  . Postcoital UTI 05/24/2019  . Spastic hemiparesis ([place]   . Vascular headache   . Mood disorder in conditions classified elsewhere   . Right middle cerebral artery stroke ([place] 02/16/2019  . Polysubstance dependence in early, early partial, sustained full, or sustained partial remission ([place]   . Dyslipidemia   . Ischemic stroke diagnosed during current admission ([place] 02/13/2019  . MDD (major depressive disorder), recurrent episode, moderate ([place] 02/10/2019  . CVA (cerebral vascular accident) ([place] 02/07/2019  . TBI (traumatic brain injury) ([place] 05/01/2016  . 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 Elevated troponin     MAlanson Puls PVirginiaDPT 07/28/2019, 2:02 PM  [place]MAIN [facility]SERV[facility] [address] [address]Phone: [phone]  Fax:  [phone] Name: LVito BackersMRN: [id number] of Birth: 605-02-1995

## 2019-08-01 ENCOUNTER — Ambulatory Visit: Payer: 59 | Admitting: Physical Therapy

## 2019-08-01 ENCOUNTER — Other Ambulatory Visit: Payer: Self-pay

## 2019-08-01 ENCOUNTER — Ambulatory Visit: Payer: 59 | Admitting: Occupational Therapy

## 2019-08-01 ENCOUNTER — Encounter: Payer: Self-pay | Admitting: Physical Therapy

## 2019-08-01 DIAGNOSIS — M25512 Pain in left shoulder: Secondary | ICD-10-CM

## 2019-08-01 DIAGNOSIS — M6281 Muscle weakness (generalized): Secondary | ICD-10-CM | POA: Diagnosis not present

## 2019-08-01 DIAGNOSIS — I639 Cerebral infarction, unspecified: Secondary | ICD-10-CM

## 2019-08-01 DIAGNOSIS — R41844 Frontal lobe and executive function deficit: Secondary | ICD-10-CM

## 2019-08-01 DIAGNOSIS — I63511 Cerebral infarction due to unspecified occlusion or stenosis of right middle cerebral artery: Secondary | ICD-10-CM

## 2019-08-01 DIAGNOSIS — R2689 Other abnormalities of gait and mobility: Secondary | ICD-10-CM

## 2019-08-01 DIAGNOSIS — R278 Other lack of coordination: Secondary | ICD-10-CM

## 2019-08-01 NOTE — Therapy (Signed)
[place] MAIN [facility] SERVICES [address] [place], [place], [address] Phone: [phone]   Fax:  [phone]  Physical Therapy Treatment  Patient Details  Name: Jane [facility] MRN: [id number] Date of Birth: 13-Jan-1996 Referring Provider (PT): Dr. Earnest Conroy. [name]   Encounter Date: 08/01/2019   PT End of Session - 08/01/19 1317    Visit Number 29    Number of Visits 24    Date for PT Re-Evaluation 08/17/19    Authorization Type UHC, 60 visit limit per discipline    PT Start Time 1145    PT Stop Time 1230    PT Time Calculation (min) 45 min    Equipment Utilized During Treatment Gait belt    Activity Tolerance Patient tolerated treatment well;No increased pain    Behavior During Therapy Flat affect           Past Medical History:  Diagnosis Date  . AKI (acute kidney injury) ([place]) 2018   "from overdose"  . Anxiety   . Chlamydia   . Daily headache   . Depression   . Drug overdose 04/2016   [name] 05/01/2016  . GERD (gastroesophageal reflux disease)    "when I was younger; gone now" (09/21/2017)  . IV drug abuse ([place]) 09/20/2017  . Migraine    "q couple weeks" (09/21/2017)  . Opioid abuse ([place])   . Overdose   . Stroke [facility])     Past Surgical History:  Procedure Laterality Date  . APPENDECTOMY  04/2013  . I & D EXTREMITY Left 09/20/2017   Procedure: IRRIGATION AND DEBRIDEMENT LEFT ARM;  Surgeon: [name], MD;  Location: [place];  Service: Orthopedics;  Laterality: Left;  . TEE WITHOUT CARDIOVERSION N/A 02/10/2019   Procedure: TRANSESOPHAGEAL ECHOCARDIOGRAM (TEE);  Surgeon: [name], MD;  Location: [facility];  Service: Cardiovascular;  Laterality: N/A;  Polysubstance abuser  . TONGUE SURGERY  ~ 2007   "related to lisp"  . TRACHEOSTOMY  04/2016   [name] 05/01/2016    There were no vitals filed for this visit.   Subjective Assessment - 08/01/19 1312    Subjective Pateint is doing better today. She is having increased tone  in her left ankle/foot and left arm. She has an appointment with her neurlogist next week.    Patient is accompained by: --   significant other   Pertinent History Pt is 24 y.o. female presenting to hospital 02/07/19 initially,  MRI showing acute infarct R MCA territory affecting basal ganglia and affecting cortical and subcortical brain in a largely watershed distribution. After in hospital stay patient transitioned to CIR from 02/16/2019-03/01/2019.  PMH includes IV drug abuse, anxiety, depression, migraines, AKI, hypoxic ischemic encephalopathy 2018, opiate overdose 2018, h/o trach 2018, h/o I&D L UE. PLOF: lives with boyfriend in one story apartment, prior to CVA patient was independent, working.    Limitations Walking;Reading;Lifting;Writing;House hold activities;Standing    How long can you sit comfortably? NA    How long can you stand comfortably? 20 mins    How long can you walk comfortably? 20 mins    Diagnostic tests see MRI results    Patient Stated Goals Patient wants to maximize how much she can walk, get back to normal    Currently in Pain? No/denies    Pain Score 0-No pain    Pain Onset In the past 7 days            Treatment: PROM to L ankle and hamstring to reduce tone  x 30 sec x 3 sets of each stretch Bridging with assist to keep L ankle in correct position Hooklying hip abd/ER x 20 with RTB and assist to keep her L ankle in correct position and stabilize RLE.  sidelying L hip abd x 10 x 2  sidelying L hip flex/ext over tray table with assist to keep L ankle in correct position and and manual resistance x 20  Patient performed with instruction, verbal cues, tactile cues of therapist: goal: increase tissue extensibility, promote proper posture, improve mobility                         PT Education - 08/01/19 1315    Education provided Yes    Education Details Stretching, HEP    Person(s) Educated Patient    Methods Explanation;Tactile cues;Verbal  cues;Demonstration    Comprehension Verbalized understanding;Returned demonstration;Tactile cues required;Need further instruction            PT Short Term Goals - 04/25/19 1627      PT SHORT TERM GOAL #1   Title Pt will be independent with initial HEP in order to indicate improved strength and decreased fall risk.    Baseline initial HEP administered on eval 03/02/2019; 04/25/19: "not as much as should."    Time 6    Period Weeks    Status Partially Met    Target Date 04/13/19             PT Long Term Goals - 06/13/19 1516      PT LONG TERM GOAL #1   Title Pt will be independent with final HEP in order to indicate improved strength and decreased fall risk.    Baseline initial HEP administered on eval 03/02/19; 04/25/19: "not as much as a should."    Time 12    Period Weeks    Status On-going    Target Date 08/17/19      PT LONG TERM GOAL #2   Title Pt will decrease 5TSTS to less than 10 seconds in order to demonstrate clinically significant improvement in LE strength and functional mobility.    Baseline 03/02/2019: 17 seconds without UE support, dependent on RLE, very little weight shift to LLE; 04/25/19: 7.7s, no UE, decreased weight shifting to LLE;    Time 12    Period Weeks    Status Achieved      PT LONG TERM GOAL #3   Title Pt will perform 6MWT > 1073f with LRAD at mod I level in order to indicate safe community/leisure negotiation.    Baseline 03/02/2019 5362f 04/25/19: 1000'    Time 12    Period Weeks    Status Partially Met    Target Date 08/17/19      PT LONG TERM GOAL #4   Title The patient will demonstrate at least 1/2 grade improvement in LE MMT grade to improve ability to perform functional activities.    Baseline see eval for details 03/02/2019; 04/25/19: see visit note, significant improvement, still lacking L anikle dorsiflexion, eversion, or inversion    Time 12    Period Weeks    Status Partially Met    Target Date 08/17/19      PT LONG TERM GOAL #5    Title Pt will ambulate >1 m/s with LRAD mod I to indicate gait velocity of community ambulator.    Baseline 03/02/19: self selected: .57 m/s, fastest .7235mwith quad cane; 04/25/19: Self-selected: 10.8s = 0.93 m/s; Fastest:  8.8s = 1.14 m/s, performed with no AD    Time 12    Period Weeks    Status Partially Met    Target Date 08/17/19                 Plan - 08/01/19 1319    Clinical Impression Statement Patient presents with increased tone today in L ankle and LUE; she is ambulating on her L tippy toes and is not able to wear her L AFO due to increased tone. Patient tolerates PROM to L ankle and L hamstring. Patient performs LLE strengthening exercises in sidelying and hooklying positions with assist for L ankle positioning. She will continue to benefit from skilled PT to improve strength and gait impairments.    Personal Factors and Comorbidities Behavior Pattern    Examination-Activity Limitations Bathing;Dressing;Sit;Transfers;Sleep;Bed Mobility;Bend;Caring for Others;Carry;Toileting;Reach Overhead;Stand;Locomotion Level;Stairs;Squat;Lift;Hygiene/Grooming    Examination-Participation Restrictions Church;Interpersonal Relationship;Personal Finances;Yard Work;Cleaning;Laundry;School;Community Activity;Medication Management;Driving;Meal Prep;Shop    Rehab Potential Good    Clinical Impairments Affecting Rehab Potential unsure of severity of cognitive/psych deficits    PT Frequency 2x / week    PT Duration 12 weeks    PT Treatment/Interventions ADLs/Self Care Home Management;DME Instruction;Gait training;Stair training;Functional mobility training;Therapeutic activities;Therapeutic exercise;Balance training;Neuromuscular re-education;Cognitive remediation;Patient/family education;Vestibular;Energy conservation;Cryotherapy;Ultrasound;Electrical Stimulation;Fluidtherapy;Iontophoresis 33m/ml Dexamethasone;Canalith Repostioning;Moist Heat;Traction;Passive range of motion;Manual techniques;Joint  Manipulations;Spinal Manipulations;Splinting;Taping;Scar mobilization;Wheelchair mobility training;Prosthetic Training;Orthotic Fit/Training;Dry needling;Visual/perceptual remediation/compensation;Aquatic Therapy    PT Next Visit Plan continue with LE strengthening, NME, and motor control activities    PT Home Exercise Plan Access Code: [email or web address]    Consulted and Agree with Plan of Care Patient           Patient will benefit from skilled therapeutic intervention in order to improve the following deficits and impairments:  Decreased activity tolerance, Decreased balance, Decreased cognition, Decreased endurance, Decreased knowledge of use of DME, Decreased mobility, Decreased strength, Impaired perceived functional ability, Postural dysfunction, Abnormal gait, Difficulty walking, Impaired tone, Decreased range of motion, Decreased coordination, Impaired UE functional use, Pain  Visit Diagnosis: Muscle weakness (generalized)  Other lack of coordination  Right middle cerebral artery stroke (HCC)  Ischemic stroke diagnosed during current admission ([facility]  Acute pain of left shoulder  Other abnormalities of gait and mobility  Frontal lobe and executive function deficit     Problem List Patient Active Problem List   Diagnosis Date Noted  . Tracheostomy status ([place] 07/05/2019  . Major depressive disorder, recurrent episode, severe ([place] 06/21/2019  . Postcoital UTI 05/24/2019  . Spastic hemiparesis ([place]   . Vascular headache   . Mood disorder in conditions classified elsewhere   . Right middle cerebral artery stroke ([place] 02/16/2019  . Polysubstance dependence in early, early partial, sustained full, or sustained partial remission ([place]   . Dyslipidemia   . Ischemic stroke diagnosed during current admission ([place] 02/13/2019  . MDD (major depressive disorder), recurrent episode, moderate ([place] 02/10/2019  . CVA (cerebral vascular accident) ([place] 02/07/2019  . TBI (traumatic brain  injury) ([place] 05/01/2016  . 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 Elevated troponin     MAlanson Puls PVirginiaDPT 08/01/2019, 1:19 PM  [place] [facility] [address]RHomer [place] [address]Phone: [phone]  Fax:  [phone] Name: LVito BackersMRN: [id number] of Birth: 6August 15, 1997

## 2019-08-03 ENCOUNTER — Encounter: Payer: Self-pay | Admitting: Adult Health

## 2019-08-03 ENCOUNTER — Ambulatory Visit (INDEPENDENT_AMBULATORY_CARE_PROVIDER_SITE_OTHER): Payer: 59 | Admitting: Obstetrics and Gynecology

## 2019-08-03 ENCOUNTER — Other Ambulatory Visit: Payer: Self-pay

## 2019-08-03 ENCOUNTER — Encounter: Payer: Self-pay | Admitting: Occupational Therapy

## 2019-08-03 ENCOUNTER — Encounter: Payer: Self-pay | Admitting: Obstetrics and Gynecology

## 2019-08-03 ENCOUNTER — Ambulatory Visit (INDEPENDENT_AMBULATORY_CARE_PROVIDER_SITE_OTHER): Payer: 59 | Admitting: Adult Health

## 2019-08-03 VITALS — BP 106/69 | HR 90 | Ht 62.0 in | Wt 114.0 lb

## 2019-08-03 VITALS — BP 110/70 | Ht 62.5 in | Wt 115.0 lb

## 2019-08-03 DIAGNOSIS — G8111 Spastic hemiplegia affecting right dominant side: Secondary | ICD-10-CM | POA: Diagnosis not present

## 2019-08-03 DIAGNOSIS — I63511 Cerebral infarction due to unspecified occlusion or stenosis of right middle cerebral artery: Secondary | ICD-10-CM

## 2019-08-03 DIAGNOSIS — Z30431 Encounter for routine checking of intrauterine contraceptive device: Secondary | ICD-10-CM

## 2019-08-03 DIAGNOSIS — IMO0002 Reserved for concepts with insufficient information to code with codable children: Secondary | ICD-10-CM

## 2019-08-03 DIAGNOSIS — G43709 Chronic migraine without aura, not intractable, without status migrainosus: Secondary | ICD-10-CM | POA: Diagnosis not present

## 2019-08-03 MED ORDER — BACLOFEN 5 MG PO TABS: 10.0000 mg | ORAL_TABLET | Freq: Two times a day (BID) | ORAL | 4 refills | Status: DC

## 2019-08-03 NOTE — Progress Notes (Signed)
[facility] [address] [place]. [place] [address] [phone]       OFFICE FOLLOW UP NOTE  [name] Date of Birth:  [birth year] Medical Record Number:  [id number]   Referring MD: Jane [facility]  Reason for Referral: Stroke  Chief complaint: Chief Complaint  Patient presents with  . Follow-up    corner rm here for a stoke f/u, Pt says she is more off balance. Pt is with her dad [place] [facility].     HPI:  Today, 08/03/2019, [facility] returns for stroke follow-up accompanied by her daughter.  Residual deficits of left spastic hemiparesis, imbalance and gait impairment and reports having increased tone in her left ankle/foot and left arm.  Reports imbalance possibly worsening but not sure if due to increased tone or so she has increased activity.  She does report frequent falls usually from legs giving out, tripping or losing balance.  She continues to work with outpatient PT/OT at [facility] with review of therapy notes who reports ongoing improvement.  Left-sided spasticity has been limiting functional use and overall rehab.  Continues to follow with PMR [facility] for management of spasticity with tizanidine and baclofen as well as recently starting Botox.  Denies new stroke/TIA symptoms.  She continues on Plavix and atorvastatin 20 mg daily for secondary stroke prevention. She does report frequent/easy bruising but no bleeding. Blood pressure today 106/69.  Reports migraine have been stable with ongoing use of topiramate 100 mg twice daily which was previously increased by [facility].  TCD bubble 05/03/2019 negative for PFO.  No further concerns at this time.     Copied from prior note for reference purposes only Consult visit 04/19/2019 [facility]: She has a past medical history of IV drug abuse with hospitalization in March 2018 with narcotic overdose and hypoxic ischemic encephalopathy requiring intubation with recovery with possibly some mild cognitive  impairment.  Patient was found unresponsive on 02/07/2019 after neighbors reported that the patient and her boyfriend had been arguing loudly that day.  Patient was found down at home by her boyfriend EMS were called and gave Narcan with some improvement in mental status.  Last known well was unclear.  NIH stroke scale initially was 14.  MRI scan of the brain showed a moderate sized patchy right middle cerebral artery infarct involving cortex as well as subcortical region CTA of the head and neck showed no significant large vessel intracranial or extracranial stenosis.  2D echo showed normal ejection fraction.  Transesophageal echo was also performed and showed no cardiac source of embolism or PFO.  LDL cholesterol was 35 mg percent hemoglobin A1c was 5.0.  Urine drug screen was positive for marijuana and cocaine.  Patient was admitted to [facility] and transferred to inpatient rehab.  She is done well and gradually improved.  She is living at home with her boyfriend.  She is getting outpatient physical occupational therapy and is able to ambulate independently with just a left foot brace and without even a cane or a walker.  She still has significant weakness in the left grip and intrinsic hand muscles.  She needs help with the boyfriend to cook as well as shower and dress herself to some degree.  She still continues to smoke cigarettes and marijuana but states she is giving up cocaine.  She does have history of migraine headaches and reports them occurring every other day.  She takes Topamax 75 mg twice daily which helps him prophylaxis.  She  takes Tylenol Extra Strength for symptomatic relief.  She has no prior history of deep and thrombosis, pulmonary embolism, stroke in a young age in the family.  She does not mention she has some mild cognitive impairment and short-term memory difficulties since initial hypoxic event from overdose in 2018 in which may have slightly worsened after the  current stroke.  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  She is presently on Plavix which is tolerating well without bruising or bleeding and Lipitor 80 mg daily.     ROS:   14 system review of systems is positive for weakness, gait difficulty, spasticity, imbalance and all other systems negative  PMH:  Past Medical History:  Diagnosis Date  . AKI (acute kidney injury) ([place]) 2018   "from overdose"  . Anxiety   . Chlamydia   . Daily headache   . Depression   . Drug overdose 04/2016   [name] 05/01/2016  . GERD (gastroesophageal reflux disease)    "when I was younger; gone now" (09/21/2017)  . IV drug abuse ([place]) 09/20/2017  . Migraine    "q couple weeks" (09/21/2017)  . Opioid abuse ([place])   . Overdose   . Stroke [facility] [place])     Social History:  Social History   Socioeconomic History  . Marital status: Significant Other    Spouse name: Not on file  . Number of children: Not on file  . Years of education: high school  . Highest education level: Not on file  Occupational History  . Not on file  Tobacco Use  . Smoking status: Current Every Day Smoker    Packs/day: 0.50    Years: 10.00    Pack years: 5.00    Types: Cigarettes  . Smokeless tobacco: Never Used  Vaping Use  . Vaping Use: Never used  Substance and Sexual Activity  . Alcohol use: No  . Drug use: Not Currently    Types: IV, Heroin, Cocaine, Marijuana    Comment: last use of heroin and cocaine was 01/2019, marijuana daily  . Sexual activity: Yes    Birth control/protection: Condom    Comment: occasionally   Other Topics Concern  . Not on file  Social History Narrative   05/24/19   From: [place], [place]   Living: with boyfriend [facility]) -- former husband died from overdose   Work: not currently, in the application process for disability      Family: good relationship with dad who lives in [place], mom is in [place]. [place] relationship with bother and sister       Enjoys: training her puppy (corndog)      Exercise: walking, exercise bike in the apartment   Diet: not the best, tries to cook at home, fruit/veggies      Safety   Seat [place]: Yes    Guns: No   Safe in relationships: Yes    Social Determinants of Radio broadcast assistant Strain:   . Difficulty of Paying Living Expenses:   Food Insecurity:   . Worried About Charity fundraiser in the Last Year:   . Arboriculturist in the Last Year:   Transportation Needs:   . Film/video editor (Medical):   Marland Kitchen Lack of Transportation (Non-Medical):   Physical Activity:   . Days of Exercise per Week:   . Minutes of Exercise per Session:   Stress:   . Feeling of Stress :  Social Connections:   . Frequency of Communication with Friends and Family:   . Frequency of Social Gatherings with Friends and Family:   . Attends Religious Services:   . Active Member of Clubs or Organizations:   . Attends Archivist Meetings:   Marland Kitchen Marital Status:   Intimate Partner Violence:   . Fear of Current or Ex-Partner:   . Emotionally Abused:   Marland Kitchen Physically Abused:   . Sexually Abused:     Medications:   Current Outpatient Medications on File Prior to Visit  Medication Sig Dispense Refill  . acetaminophen (TYLENOL) 325 MG tablet Take 2 tablets (650 mg total) by mouth every 4 (four) hours as needed for mild pain (or temp > 37.5 C (99.5 F)).    Marland Kitchen atorvastatin (LIPITOR) 20 MG tablet Take 1 tablet (20 mg total) by mouth daily at 6 PM. 90 tablet 1  . Baclofen 5 MG TABS Take 1 tablet by mouth 3 (three) times daily as needed. 90 tablet 3  . benzoyl peroxide 5 % external liquid Apply topically 2 (two) times daily.    . busPIRone (BUSPAR) 5 MG tablet Take 1 tablet (5 mg total) by mouth 3 (three) times daily. 60 tablet 1  . clindamycin-benzoyl peroxide (BENZACLIN) gel As directed    . clopidogrel (PLAVIX) 75 MG tablet TAKE 1 TABLET BY MOUTH EVERY DAY 30 tablet 0  . doxycycline (VIBRA-TABS) 100 MG  tablet Take 100 mg by mouth 2 (two) times daily.    Marland Kitchen FLUoxetine (PROZAC) 40 MG capsule Take 1 capsule (40 mg total) by mouth daily. 30 capsule 3  . NARCAN 4 MG/0.1ML LIQD nasal spray kit 1 spray once.    . nitrofurantoin, macrocrystal-monohydrate, (MACROBID) 100 MG capsule Take 1 capsule (100 mg total) by mouth daily as needed (within 2 hours after intercourse). 30 capsule 1  . pantoprazole (PROTONIX) 40 MG tablet Take 1 tablet (40 mg total) by mouth daily. 30 tablet 0  . QUEtiapine (SEROQUEL) 50 MG tablet Take 1 tablet (50 mg total) by mouth at bedtime. 90 tablet 1  . tiZANidine (ZANAFLEX) 4 MG tablet Take 1 tablet (4 mg total) by mouth 3 (three) times daily. 90 tablet 1  . topiramate (TOPAMAX) 50 MG tablet TAKE 2 TABLETS (100 MG TOTAL) BY MOUTH 2 (TWO) TIMES DAILY. 360 tablet 0  . tretinoin (RETIN-A) 0.05 % cream As directed     Current Facility-Administered Medications on File Prior to Visit  Medication Dose Route Frequency Provider Last Rate Last Admin  . busPIRone (BUSPAR) tablet 5 mg  5 mg Oral BID Raulkar, Clide Deutscher, MD      . paragard intrauterine copper IUD   Intrauterine Once Copland, Alicia B, PA-C        Allergies:   Allergies  Allergen Reactions  . Other Rash    Tide detergent   . Sulfa Antibiotics Rash    Physical Exam  Today's Vitals   08/03/19 1328  BP: 106/69  Pulse: 90  Weight: 114 lb (51.7 kg)  Height: _0  (1.575 m)   Body mass index is 20.85 kg/m.  General: Petite frail pleasant young Caucasian lady, seated, in no evident distress Head: head normocephalic and atraumatic.   Neck: supple with no carotid or supraclavicular bruits Cardiovascular: regular rate and rhythm, no murmurs Musculoskeletal: no deformity Skin:  no rash/petichiae Vascular:  Normal pulses all extremities  Neurologic Exam Mental Status: Awake and fully alert. Fluent speech and language. Oriented to place and time. Recent  and remote memory intact. Attention span, concentration and  fund of knowledge appropriate. Mood and affect appropriate. Pleasant and cooperative.  Cranial Nerves: Pupils equal, briskly reactive to light. Extraocular movements full without nystagmus. Visual fields full to confrontation. Hearing intact. Facial sensation intact.  Moderate left lower facial weakness, tongue, palate moves normally and symmetrically.  Motor: 4/5 Spastic left hemiparesis with greater weakness hand grip and ankle dorsiflexion weakness Sensory.: intact to touch , pinprick , position and vibratory sensation.  Coordination: Decreased rapid movements on the left side. Finger-to-nose and heel-to-shin performed accurately on right side Gait and Station: Arises from chair without difficulty.  Walks with hemiplegic gait with circumduction and pointed left foot with increased tone and use of ankle brace Reflexes: 2+ and asymmetric and brisker on the left arm>leg. Toes downgoing.       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  Vascular risk factors of smoking, marijuana and cocaine abuse only.     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 -Residual left spastic hemiparesis, imbalance  -Increase baclofen to 10 mg twice daily, continue current dose of tizanidine and ongoing Botox treatments with PMR  -Continue to participate in outpatient PT/OT  -Discussed use of assistive device outdoors for fall prevention and to slow down when doing certain activities that challenge balance due to frequent falls -Continue Plavix and atorvastatin 20 mg daily for secondary stroke prevention -Continue to follow with PCP for HLD management and prescribing of atorvastatin   Migraines -Stable -Continue Topamax 100 mg twice daily    Follow-up in 4 months or call earlier if needed    I spent 25 minutes of face-to-face and non-face-to-face time with patient and father.  This included  previsit chart review, lab review, study review, order entry, electronic health record documentation, patient education   Frann Rider, [facility]  [facility] [address] [place], [place] [address]  Phone [phone] Fax [phone] Note: This document was prepared with digital dictation and possible smart phrase technology. Any transcriptional errors that result from this process are unintentional.

## 2019-08-03 NOTE — Patient Instructions (Addendum)
Continue to work with outpatient therapy for ongoing stroke deficits  Recommend use of assistive device when outdoors or on uneven surfaces to prevent falls.  Important to slow down doing different activities that may challenge your balance  Increase baclofen to 10mg  twice daily -you may divide doses up as needed based on tolerance such as 5mg  AM and 15mg  PM; new refill provided  Continue tizanidine and Botox injections as well as ongoing follow-up with physical medicine and rehab  Continue Topamax 50 mg twice daily for migraine prophylaxis  Continue clopidogrel 75 mg daily  and atorvastatin for secondary stroke prevention  Continue to follow up with PCP regarding cholesterol management   Maintain strict control of hypertension with blood pressure goal below 130/90, diabetes with hemoglobin A1c goal below 6.5% and cholesterol with LDL cholesterol (bad cholesterol) goal below 70 mg/dL. I also advised the patient to eat a healthy diet with plenty of whole grains, cereals, fruits and vegetables, exercise regularly and maintain ideal body weight.  Followup in the future with me in 4 months or call earlier if needed       Thank you for coming to see at [facility]. I hope we have been able to provide you high quality care today.  You may receive a patient satisfaction survey over the next few weeks. We would appreciate your feedback and comments so that we may continue to improve ourselves and the health of our patients.

## 2019-08-03 NOTE — Progress Notes (Signed)
   Chief Complaint  Patient presents with  . IUD check     History of Present Illness:  Jane Watkins is a 24 y.o. that had a Paragard IUD placed approximately 1 month ago. Since that time, she has had irregular bleeding and cramping. Both resolved a couple days ago. No bleeding/cramping now. No increased d/c or vag sx. Has not been sex active since placement.   Review of Systems  Constitutional: Negative for fever.  Gastrointestinal: Negative for blood in stool, constipation, diarrhea, nausea and vomiting.  Genitourinary: Negative for dyspareunia, dysuria, flank pain, frequency, hematuria, urgency, vaginal bleeding, vaginal discharge and vaginal pain.  Musculoskeletal: Negative for back pain.  Skin: Negative for rash.    Physical Exam:  BP 110/70   Ht 5' 2.5" (1.588 m)   Wt 115 lb (52.2 kg)   BMI 20.70 kg/m  Body mass index is 20.7 kg/m.  Pelvic exam:  Two IUD strings present seen coming from the cervical os. EGBUS, vaginal vault and cervix: within normal limits   Assessment:   Encounter for routine checking of intrauterine contraceptive device (IUD)  If BTB/dysmen persist, can check GYN u/s. No sx for a few days. F/u prn.  IUD strings present in proper location; pt doing well  Plan: F/u if any signs of infection or can no longer feel the strings.   Elta Angell B. Daesha Insco, PA-C 08/03/2019 4:10 PM

## 2019-08-03 NOTE — Patient Instructions (Signed)
I value your feedback and entrusting us with your care. If you get a [facility] patient survey, I would appreciate you taking the time to let us know about your experience today. Thank you!  As of January 20, 2019, your lab results will be released to your MyChart immediately, before I even have a chance to see them. Please give me time to review them and contact you if there are any abnormalities. Thank you for your patience.  

## 2019-08-03 NOTE — Therapy (Signed)
[place] MAIN [facility] SERVICES [address] [place], [place], [address] Phone: [phone]   Fax:  [phone]  Occupational Therapy Treatment/Progress Update Reporting period from 06/27/2019 to 08/01/2019  Patient Details  Name: Jane Watkins MRN: [id number] Date of Birth: February 09, 1996 Referring Provider (OT): Dr. [name]   Encounter Date: 08/01/2019   OT End of Session - 08/03/19 2051    Visit Number 30    Number of Visits 48    Date for OT Re-Evaluation 08/29/19    Authorization Type Progress report period starting 08/01/2019    OT Start Time 1116    OT Stop Time 1145    OT Time Calculation (min) 29 min    Activity Tolerance Patient tolerated treatment well    Behavior During Therapy [facility] for tasks assessed/performed           Past Medical History:  Diagnosis Date  . AKI (acute kidney injury) ([place]) 2018   "from overdose"  . Anxiety   . Chlamydia   . Daily headache   . Depression   . Drug overdose 04/2016   [name] 05/01/2016  . GERD (gastroesophageal reflux disease)    "when I was younger; gone now" (09/21/2017)  . IV drug abuse ([place]) 09/20/2017  . Migraine    "q couple weeks" (09/21/2017)  . Opioid abuse ([place])   . Overdose   . Stroke [facility])     Past Surgical History:  Procedure Laterality Date  . APPENDECTOMY  04/2013  . I & D EXTREMITY Left 09/20/2017   Procedure: IRRIGATION AND DEBRIDEMENT LEFT ARM;  Surgeon: [name], MD;  Location: [place];  Service: Orthopedics;  Laterality: Left;  . TEE WITHOUT CARDIOVERSION N/A 02/10/2019   Procedure: TRANSESOPHAGEAL ECHOCARDIOGRAM (TEE);  Surgeon: [name], MD;  Location: [facility] ORS;  Service: Cardiovascular;  Laterality: N/A;  Polysubstance abuser  . TONGUE SURGERY  ~ 2007   "related to lisp"  . TRACHEOSTOMY  04/2016   [name] 05/01/2016    There were no vitals filed for this visit.   Subjective Assessment - 08/03/19 2050    Subjective  Patient reports she is doing well,  had a great time with family over the weekend celebrating Father's Day.    Pertinent History Per chart. Pt. is a 24 y.o.female with a history of IV drug use, polysubstance as well as tobacco abuse, anxiety. Pt. presented to [facility] on 02/07/2019 after being found unresponsive.  EMS contacted patient received Narcan with some improvement in mental status.  Admission labs with WBC 20,700, potassium 5.2, BUN 25, creatinine 0.67, urine drug screen positive cocaine, marijuana and benzodiazepines, alcohol level less than 10, SARS coronavirus negative.  Cranial CT scan showed areas of indistinct low-density in the right basal ganglia with mass-effect on the right lateral ventricle.  Patient did not receive TPA.  MRI showed acute infarction of right MCA territory, affecting the basal ganglia and affecting the cortical and subcortical brain in a largely watershed distribution. Pt. had a previous inpatient rehab admission 05/01/2016 to 05/09/2016 for hypoxic encephalopathy after drug overdose requiring tracheostomy, and was later decannulated. Pt. resides with her boyfriend in an apartment. 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    Patient Stated Goals Patient reports she would like to be as independent as possible with all her daily tasks.    Currently in Pain? No/denies    Pain Score 0-No pain  [facility] OT Assessment - 08/02/19 2054      Coordination   Right 9 Hole Peg Test 22    Left 9 Hole Peg Test unable      AROM   Overall AROM Comments shoulder flexion 152 degrees, ABD 130, elbow flexion 144, extension -10, wrist flexion 47, extension 52       Hand Function   Right Hand Grip (lbs) 64    Right Hand Lateral Pinch 16 lbs    Right Hand 3 Point Pinch 19 lbs    Left Hand Grip (lbs) 20    Left Hand Lateral Pinch 9 lbs    Left 3 point pinch 6 lbs   gauge slipping          Patient late for therapy session, reports traffic was bad today.   Reassessment this date  for progress update, see flowsheet above for measurements Goals updated to reflect progress.  PROM to left UE followed by [facility] prior to taking measurements this date.   Patient currently living with her mom, modified independent with basic self care tasks, able to drive herself to therapy now.    Response to tx: Patient continues to progress in all areas, continues to demonstrate limitations in ROM of LUE but has improved over time.  Continues to demo increased spasticity in LUE which limits functional use as well as isolated movements of the hand, wrist and digits.  Patient continues to benefit from skilled OT services to maximize safety and independence in necessary daily tasks.                        OT Long Term Goals - 08/01/19 1129      OT LONG TERM GOAL #1   Title Pt. will increase active isolated left shoulder flexion by 20 degrees in preparation for combing her hair.    Baseline 06/09/2019: Shoulder flexion 152. 04/25/2019: Shoulder flexion 132. Pt. is unable to use her left hand to brush her hair. Eval: No isolated shoulder shoulder flexion. Pt. compensates with synergystic movement with attempts for flexion.    Time 12    Period Weeks    Status Achieved      OT LONG TERM GOAL #2   Title Pt. will increase active shoulder abduction by 20 degrees in preparation to be able to wash her hair.    Baseline 05/30/2019: Shoulder abduction: 108 Pt. is able to rinse her hair not wash it. 04/25/2019: shoulder abduction:95. Pt. is unable to use her LUE to wash her hair. Eval: Active shoulder abduction 56 degrees.  08/01/2019 slow to move, better on some days than others.    Time 12    Period Weeks    Status Partially Met    Target Date 08/29/19      OT LONG TERM GOAL #3   Title Pt. will improve hand to face patterns using her left hand to be able to independently wash her face.    Baseline 05/30/2019: Pt. is able to wash her face with a wash rag using her left hand. 04/25/2019:  Pt. is able to perfrom hand to face patterns with her left hand, however is not able to use for left hand to wash her face.Eval: Pt. is unable to wash her face using her left hand    Time 12    Period Weeks    Status Achieved      OT LONG TERM GOAL #4   Title Pt. will increase AROM  wrist extension by 10 degrees in preparation for reaching for a cup    Baseline 05/30/2019: 54 degrees. Pt. is able to reach for a cup. 04/25/2019: Wrist extension 54. Pt. conitnues to work to reaching for a cup with her left hand. Eval: 0 degrees of active wrist extension.    Time 12    Period Weeks    Status Achieved      OT LONG TERM GOAL #5   Title Pt. will increase left digit flexion to be able to initiate actively holding a toothbrush.    Baseline 05/30/2019: Pt. is able to hold, and use a toothbrush. 04/25/2019: Pt. is indpendently able to hold, and stabilize a toorhbursh with her left hand while applying toothpaste. pt. is unable to use her left hand to brush her teeth. Eval: Pt. is unable to use her left hand to hold a toothbrush.    Time 12    Period Weeks    Status Achieved      OT LONG TERM GOAL #6   Title Pt. will assume a mature tripod grasp on a pen while writing her name wiht 100% legibility.    Baseline 05/30/2019: Pt. is unable to assume a mature tripod grasp on a pen when drawing a line.    Time 12    Period Weeks    Status On-going    Target Date 08/29/19      OT LONG TERM GOAL #7   Title Pt. will independently type a 3 sentence email message efficiently with 100% accuracy    Baseline 05/30/2019: Pt. is unable.    Time 12    Period Weeks    Status On-going    Target Date 08/29/19      OT LONG TERM GOAL #8   Title Pt. will improve left hand [facility] skills in order to independentlymanipulate small objects needed during ADLs, and IADLs,    Baseline 06/09/2019: Left hand [facility] skills are limited.    Time 12    Period Weeks    Status On-going    Target Date 08/29/19      OT LONG TERM GOAL  #9    TITLE Pt. will use her left hand to independently handle, and use cooking, and baking utensils.    Baseline 05/30/2019: Pt. has difficulty    Time 0    Period Weeks    Status On-going    Target Date 08/29/19                 Plan - 08/03/19 2052    Clinical Impression Statement Patient continues to progress in all areas, continues to demonstrate limitations in ROM of LUE but has improved over time.  Continues to demo increased spasticity in LUE which limits functional use as well as isolated movements of the hand, wrist and digits.  Patient continues to benefit from skilled OT services to maximize safety and independence in necessary daily tasks.    OT Occupational Profile and History Detailed Assessment- Review of Records and additional review of physical, cognitive, psychosocial history related to current functional performance    Occupational performance deficits (Please refer to evaluation for details): ADL's;IADL's    Body Structure / Function / Physical Skills ADL;IADL;Coordination;Endurance;UE functional use;Decreased knowledge of precautions;Dexterity;FMC;ROM;Tone;Strength    Rehab Potential Good    Clinical Decision Making Several treatment options, min-mod task modification necessary    Comorbidities Affecting Occupational Performance: May have comorbidities impacting occupational performance    Modification or Assistance to Complete Evaluation  Min-Moderate  modification of tasks or assist with assess necessary to complete eval    OT Frequency 2x / week    OT Duration 12 weeks    OT Treatment/Interventions Self-care/ADL training;DME and/or AE instruction;Energy conservation;Therapeutic activities;Patient/family education;Therapeutic exercise;Neuromuscular education    Consulted and Agree with Plan of Care Patient           Patient will benefit from skilled therapeutic intervention in order to improve the following deficits and impairments:   Body Structure / Function /  Physical Skills: ADL, IADL, Coordination, Endurance, UE functional use, Decreased knowledge of precautions, Dexterity, FMC, ROM, Tone, Strength       Visit Diagnosis: Muscle weakness (generalized)  Other lack of coordination  Right middle cerebral artery stroke [facility])    Problem List Patient Active Problem List   Diagnosis Date Noted  . Tracheostomy status ([place]) 07/05/2019  . Major depressive disorder, recurrent episode, severe ([place]) 06/21/2019  . Postcoital UTI 05/24/2019  . Spastic hemiparesis ([place])   . Vascular headache   . Mood disorder in conditions classified elsewhere   . 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 Polysubstance dependence in early, early partial, sustained full, or sustained partial remission ([place])   . Dyslipidemia   . Ischemic stroke diagnosed during current admission ([place]) 02/13/2019  . MDD (major depressive disorder), recurrent episode, moderate ([place]) 02/10/2019  . CVA (cerebral vascular accident) ([place]) 02/07/2019  . TBI (traumatic brain injury) ([place]) 05/01/2016  . 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 Elevated troponin    Achilles Dunk, OTR/L, CLT  Paiton Boultinghouse 08/03/2019, 9:05 PM  [place] MAIN [facility] SERVICES [address] [place], [place], [address] Phone: [phone]   Fax:  [phone]  Name: Jane Watkins MRN: [id number] Date of Birth: 1995/03/06

## 2019-08-04 ENCOUNTER — Ambulatory Visit: Payer: 59 | Admitting: Physical Therapy

## 2019-08-04 ENCOUNTER — Ambulatory Visit: Payer: 59 | Admitting: Adult Health

## 2019-08-04 ENCOUNTER — Ambulatory Visit: Payer: 59 | Admitting: Occupational Therapy

## 2019-08-05 NOTE — Progress Notes (Signed)
I agree with the above plan 

## 2019-08-08 ENCOUNTER — Ambulatory Visit: Payer: 59 | Admitting: Physical Therapy

## 2019-08-08 ENCOUNTER — Other Ambulatory Visit: Payer: Self-pay

## 2019-08-08 ENCOUNTER — Ambulatory Visit: Payer: 59 | Admitting: Occupational Therapy

## 2019-08-08 ENCOUNTER — Ambulatory Visit: Payer: 59 | Admitting: Psychology

## 2019-08-08 ENCOUNTER — Encounter: Payer: Self-pay | Admitting: Occupational Therapy

## 2019-08-08 DIAGNOSIS — R278 Other lack of coordination: Secondary | ICD-10-CM

## 2019-08-08 DIAGNOSIS — M6281 Muscle weakness (generalized): Secondary | ICD-10-CM

## 2019-08-08 NOTE — Therapy (Signed)
[place] MAIN [facility] SERVICES [address] [place], [place], [address] Phone: [phone]   Fax:  [phone]  Occupational Therapy Treatment  Patient Details  Name: Jane Watkins MRN: [id number] Date of Birth: Aug 19, 1995 Referring Provider (OT): Dr. [name]   Encounter Date: 08/08/2019   OT End of Session - 08/08/19 1330    Visit Number 31    Number of Visits 69    Date for OT Re-Evaluation 08/29/19    Authorization Type Progress report period starting 08/01/2019    OT Start Time 1145    OT Stop Time 1230    OT Time Calculation (min) 45 min    Activity Tolerance Patient tolerated treatment well    Behavior During Therapy [facility] for tasks assessed/performed           Past Medical History:  Diagnosis Date  . AKI (acute kidney injury) ([place]) 2018   "from overdose"  . Anxiety   . Chlamydia   . Daily headache   . Depression   . Drug overdose 04/2016   [name] 05/01/2016  . GERD (gastroesophageal reflux disease)    "when I was younger; gone now" (09/21/2017)  . IV drug abuse ([place]) 09/20/2017  . Migraine    "q couple weeks" (09/21/2017)  . Opioid abuse ([place])   . Overdose   . Stroke [facility])     Past Surgical History:  Procedure Laterality Date  . APPENDECTOMY  04/2013  . I & D EXTREMITY Left 09/20/2017   Procedure: IRRIGATION AND DEBRIDEMENT LEFT ARM;  Surgeon: [name], MD;  Location: [place];  Service: Orthopedics;  Laterality: Left;  . TEE WITHOUT CARDIOVERSION N/A 02/10/2019   Procedure: TRANSESOPHAGEAL ECHOCARDIOGRAM (TEE);  Surgeon: [name], MD;  Location: [facility];  Service: Cardiovascular;  Laterality: N/A;  Polysubstance abuser  . TONGUE SURGERY  ~ 2007   "related to lisp"  . TRACHEOSTOMY  04/2016   [name] 05/01/2016    There were no vitals filed for this visit.   Subjective Assessment - 08/08/19 1329    Subjective  Pt. reports that shebroke up with her boyfreind, and he left her belongings in her front yard.     Patient is accompanied by: Family member    Pertinent History Per chart. Pt. is a 24 y.o.female with a history of IV drug use, polysubstance as well as tobacco abuse, anxiety. Pt. presented to [facility] on 02/07/2019 after being found unresponsive.  EMS contacted patient received Narcan with some improvement in mental status.  Admission labs with WBC 20,700, potassium 5.2, BUN 25, creatinine 0.67, urine drug screen positive cocaine, marijuana and benzodiazepines, alcohol level less than 10, SARS coronavirus negative.  Cranial CT scan showed areas of indistinct low-density in the right basal ganglia with mass-effect on the right lateral ventricle.  Patient did not receive TPA.  MRI showed acute infarction of right MCA territory, affecting the basal ganglia and affecting the cortical and subcortical brain in a largely watershed distribution. Pt. had a previous inpatient rehab admission 05/01/2016 to 05/09/2016 for hypoxic encephalopathy after drug overdose requiring tracheostomy, and was later decannulated. Pt. resides with her boyfriend in an apartment. 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    Currently in Pain? Yes    Pain Score 5     Pain Location Knee    Pain Orientation Left    Pain Descriptors / Indicators Aching            OT TREATMENT  Neuro muscular re-education:  Pt. tolerated weightbearing, and proprioceptive input through the LUE, and hand. Pt. required assist and increased time to prepare, and position her LUE, and hand for weightbearing, and proprioceptive input. Weightbearing, and proprioception with slow rocking was performed to normalize tone, and prepare the LUE for ROM, and functional reaching. Pt. Performed rowing with bilateral alternating UE movements. Pt. Worked on reaching with emphasis placed on releasing her grasp on cones, and fully extending digits while sitting at the edge of the mat.   Therapeutic Exercise:  Pt. tolerated AAROM in all  joint ranges of the LUE, and hand. Pt. tolerated PROM to the left hand 3rd, and 4th digit MPs, PIPS, and DIPs secondary to increased flexor tone, and tightness. Pt. Worked on shoulder stabilization exercises in supine with her shoulder elevated to 90 degrees, and her elbow extended. Pt. Was able to manitin her UE in elevation with perturbations, perform circular motion, and formulate the alphabet to the letter H.  Manual Therapy  Pt. Tolerated scapular mobilizations for elevation, depression, abduction, and rotation. Pt. Tolerated soft tissue mobilizations at the radius on ulna for supination, and metacarpal spread stretches. Manual techniques were performed in preparation for, and independent of Therapeutic Ex.  Pt. presents with increased flexor tone, and spasticity in the LUE, and hand. Pt. presented with increased clonus in the LUE when attempting to use the left hand.  Pt. Reported 5/10 pain in the left knee. Pt. reports intermittent pain (not rated) in the left 3rd, and 4th digit MP joint. No pain with weightbearing through the LUE. Pt. Reports increased falls over the past few weeks with 6-7 falls occurring last week.  Pt. Responded well to inhibitory techniques presenting with more active ROM, and was able to achieve more active digit extension.                         OT Education - 08/08/19 1330    Education Details reaching tasks, ROM of hand    Person(s) Educated Patient    Methods Explanation;Demonstration;Tactile cues;Verbal cues;Handout    Comprehension Verbalized understanding;Returned demonstration;Need further instruction               OT Long Term Goals - 08/01/19 1129      OT LONG TERM GOAL #1   Title Pt. will increase active isolated left shoulder flexion by 20 degrees in preparation for combing her hair.    Baseline 06/09/2019: Shoulder flexion 152. 04/25/2019: Shoulder flexion 132. Pt. is unable to use her left hand to brush her hair. Eval: No  isolated shoulder shoulder flexion. Pt. compensates with synergystic movement with attempts for flexion.    Time 12    Period Weeks    Status Achieved      OT LONG TERM GOAL #2   Title Pt. will increase active shoulder abduction by 20 degrees in preparation to be able to wash her hair.    Baseline 05/30/2019: Shoulder abduction: 108 Pt. is able to rinse her hair not wash it. 04/25/2019: shoulder abduction:95. Pt. is unable to use her LUE to wash her hair. Eval: Active shoulder abduction 56 degrees.  08/01/2019 slow to move, better on some days than others.    Time 12    Period Weeks    Status Partially Met    Target Date 08/29/19      OT LONG TERM GOAL #3   Title Pt. will improve hand to face patterns using her left hand to  be able to independently wash her face.    Baseline 05/30/2019: Pt. is able to wash her face with a wash rag using her left hand. 04/25/2019: Pt. is able to perfrom hand to face patterns with her left hand, however is not able to use for left hand to wash her face.Eval: Pt. is unable to wash her face using her left hand    Time 12    Period Weeks    Status Achieved      OT LONG TERM GOAL #4   Title Pt. will increase AROM wrist extension by 10 degrees in preparation for reaching for a cup    Baseline 05/30/2019: 54 degrees. Pt. is able to reach for a cup. 04/25/2019: Wrist extension 54. Pt. conitnues to work to reaching for a cup with her left hand. Eval: 0 degrees of active wrist extension.    Time 12    Period Weeks    Status Achieved      OT LONG TERM GOAL #5   Title Pt. will increase left digit flexion to be able to initiate actively holding a toothbrush.    Baseline 05/30/2019: Pt. is able to hold, and use a toothbrush. 04/25/2019: Pt. is indpendently able to hold, and stabilize a toorhbursh with her left hand while applying toothpaste. pt. is unable to use her left hand to brush her teeth. Eval: Pt. is unable to use her left hand to hold a toothbrush.    Time 12     Period Weeks    Status Achieved      OT LONG TERM GOAL #6   Title Pt. will assume a mature tripod grasp on a pen while writing her name wiht 100% legibility.    Baseline 05/30/2019: Pt. is unable to assume a mature tripod grasp on a pen when drawing a line.    Time 12    Period Weeks    Status On-going    Target Date 08/29/19      OT LONG TERM GOAL #7   Title Pt. will independently type a 3 sentence email message efficiently with 100% accuracy    Baseline 05/30/2019: Pt. is unable.    Time 12    Period Weeks    Status On-going    Target Date 08/29/19      OT LONG TERM GOAL #8   Title Pt. will improve left hand [facility] skills in order to independentlymanipulate small objects needed during ADLs, and IADLs,    Baseline 06/09/2019: Left hand [facility] skills are limited.    Time 12    Period Weeks    Status On-going    Target Date 08/29/19      OT LONG TERM GOAL  #9   TITLE Pt. will use her left hand to independently handle, and use cooking, and baking utensils.    Baseline 05/30/2019: Pt. has difficulty    Time 0    Period Weeks    Status On-going    Target Date 08/29/19                 Plan - 08/08/19 1331    Clinical Impression Statement Pt. presents with increased flexor tone, and spasticity in the LUE, and hand. Pt. presented with increased clonus in the LUE when attempting to use the left hand.  Pt. Reported 5/10 pain in the left knee. Pt. reports intermittent pain (not rated) in the left 3rd, and 4th digit MP joint. No pain with weightbearing through the LUE. Pt. Reports increased  falls over the past few weeks with 6-7 falls occurring last week.  Pt. Responded well to inhibitory techniques presenting with more active ROM, and was able to achieve more active digit extension.   OT Occupational Profile and History Detailed Assessment- Review of Records and additional review of physical, cognitive, psychosocial history related to current functional performance    Occupational  performance deficits (Please refer to evaluation for details): ADL's;IADL's    Body Structure / Function / Physical Skills ADL;IADL;Coordination;Endurance;UE functional use;Decreased knowledge of precautions;Dexterity;FMC;ROM;Tone;Strength    Rehab Potential Good    Clinical Decision Making Several treatment options, min-mod task modification necessary    Comorbidities Affecting Occupational Performance: May have comorbidities impacting occupational performance    Modification or Assistance to Complete Evaluation  Min-Moderate modification of tasks or assist with assess necessary to complete eval    OT Frequency 2x / week    OT Duration 12 weeks    OT Treatment/Interventions Self-care/ADL training;DME and/or AE instruction;Energy conservation;Therapeutic activities;Patient/family education;Therapeutic exercise;Neuromuscular education    Consulted and Agree with Plan of Care Patient           Patient will benefit from skilled therapeutic intervention in order to improve the following deficits and impairments:   Body Structure / Function / Physical Skills: ADL, IADL, Coordination, Endurance, UE functional use, Decreased knowledge of precautions, Dexterity, FMC, ROM, Tone, Strength       Visit Diagnosis: Muscle weakness (generalized)  Other lack of coordination    Problem List Patient Active Problem List   Diagnosis Date Noted  . Tracheostomy status ([place]) 07/05/2019  . Major depressive disorder, recurrent episode, severe ([place]) 06/21/2019  . Postcoital UTI 05/24/2019  . Spastic hemiparesis ([place])   . Vascular headache   . Mood disorder in conditions classified elsewhere   . 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 Polysubstance dependence in early, early partial, sustained full, or sustained partial remission ([place])   . Dyslipidemia   . Ischemic stroke diagnosed during current admission ([place]) 02/13/2019  . MDD (major depressive disorder), recurrent episode, moderate  ([place]) 02/10/2019  . CVA (cerebral vascular accident) ([place]) 02/07/2019  . TBI (traumatic brain injury) ([place]) 05/01/2016  . 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 Elevated troponin     Harrel Carina, MS, OTR/L 08/08/2019, 3:07 PM  [place] MAIN [facility] SERVICES [address] [place], [place], [address] Phone: [phone]   Fax:  [phone]  Name: Jane Watkins MRN: [id number] Date of Birth: [birth year]

## 2019-08-11 ENCOUNTER — Encounter: Payer: Self-pay | Admitting: Occupational Therapy

## 2019-08-11 ENCOUNTER — Encounter: Payer: Self-pay | Admitting: Emergency Medicine

## 2019-08-11 ENCOUNTER — Other Ambulatory Visit: Payer: Self-pay

## 2019-08-11 ENCOUNTER — Emergency Department
Admission: EM | Admit: 2019-08-11 | Discharge: 2019-08-11 | Disposition: A | Discharge status: Home / Self Care | Payer: 59 | Attending: Emergency Medicine | Admitting: Emergency Medicine

## 2019-08-11 ENCOUNTER — Ambulatory Visit: Payer: 59 | Attending: Physical Medicine & Rehabilitation | Admitting: Physical Therapy

## 2019-08-11 ENCOUNTER — Emergency Department: Payer: 59

## 2019-08-11 ENCOUNTER — Ambulatory Visit: Payer: 59 | Admitting: Occupational Therapy

## 2019-08-11 ENCOUNTER — Encounter: Payer: Self-pay | Admitting: Physical Therapy

## 2019-08-11 VITALS — BP 91/55

## 2019-08-11 DIAGNOSIS — M6281 Muscle weakness (generalized): Secondary | ICD-10-CM | POA: Insufficient documentation

## 2019-08-11 DIAGNOSIS — R4781 Slurred speech: Secondary | ICD-10-CM | POA: Diagnosis not present

## 2019-08-11 DIAGNOSIS — Z5321 Procedure and treatment not carried out due to patient leaving prior to being seen by health care provider: Secondary | ICD-10-CM | POA: Insufficient documentation

## 2019-08-11 DIAGNOSIS — R410 Disorientation, unspecified: Secondary | ICD-10-CM | POA: Diagnosis not present

## 2019-08-11 DIAGNOSIS — I639 Cerebral infarction, unspecified: Secondary | ICD-10-CM | POA: Insufficient documentation

## 2019-08-11 DIAGNOSIS — R41844 Frontal lobe and executive function deficit: Secondary | ICD-10-CM

## 2019-08-11 DIAGNOSIS — M25512 Pain in left shoulder: Secondary | ICD-10-CM | POA: Insufficient documentation

## 2019-08-11 DIAGNOSIS — R278 Other lack of coordination: Secondary | ICD-10-CM | POA: Insufficient documentation

## 2019-08-11 DIAGNOSIS — I63511 Cerebral infarction due to unspecified occlusion or stenosis of right middle cerebral artery: Secondary | ICD-10-CM

## 2019-08-11 DIAGNOSIS — R2689 Other abnormalities of gait and mobility: Secondary | ICD-10-CM

## 2019-08-11 DIAGNOSIS — R400 Somnolence: Secondary | ICD-10-CM | POA: Diagnosis present

## 2019-08-11 NOTE — ED Notes (Signed)
Pt presentation discussed with EDP, Paduchowski; see new orders. 

## 2019-08-11 NOTE — ED Triage Notes (Signed)
Pt in via outpatient PT, where she is seen twice a week due to recent CVA December 2020.  Per Speech Therapy, pt presented today very drowsy w/ slurred speech.  Pt A/Ox4, some confusion noted.  Unable to recall when she last took her muscle relaxer.    Vitals WDL, NAD noted at this time.

## 2019-08-11 NOTE — ED Triage Notes (Signed)
Pt up and walking outside, stating that she is leaving. RN followed pt in the parking lot and called charge and MD to come and evaluate pt before she left to ensure safety of patient.  Pt also wanting cell phone, states it was in "Happy's office charging in speech therapy". RN advised pt that she called and left a message with Happy who was with other patients. Pt states "no you didn't you fucking bich, leave me alone". RN continued to follow patient for safety of patient and Engineer, materials with RN. Pt asks why she is being held again her will. RN informed pt that she was not being held that we were walking behind her until the MD could evaluate her.

## 2019-08-11 NOTE — ED Notes (Signed)
To parking lot for MD/RN staying with a patient for patient's safety, who wanted to leave.  Pt requesting to get her cell phone from therapists office.  This RN went to office and retrieved phone.  Discussed with patient concern from therapist was that she wasn't acting like normal self and was sleepier than normal.  Advised patient not to drive and she said she had called a ride and needed to get to medical mall entrance where valet was for ride to pick her up.  Walked with patient to medical mall.  Observed patient leaving the building, getting into car as driver and driving away.  Advised [facility] for patient concern and security to follow up with local law enforcement.

## 2019-08-11 NOTE — Therapy (Signed)
[place] [facility] MAIN [facility] SERVICES [address] [place], [place], [address] Phone: [phone]   Fax:  [phone]  Patient Details  Name: Jane Watkins MRN: [id number] Date of Birth: 06/07/1995 Referring Provider:  Ranelle Oyster, MD  Encounter Date: 08/11/2019  Pt. arrived for the OT treatment session after PT. Pt. intermittently closed her eyes with difficulty at times keeping them open.  Pt. presented with intermittent slurred speech, and intermittant confusion with incoherent speech. Pt.'s BP 95/55, HR 81. A medical alert was issued with the rapid response team secondary to changes during the session. The rapid response team assessed the pt., and assisted her to the emergency department for further evaluation. Pt. presents wtih increased flexor tone, and tighness in her left side. Pt. presents with multiple bruises from multiple falls. Pt. reports 6-7 falls a week. Pt. drove herself to therapy today.  Olegario Messier, MS, OTR/L 08/11/2019, 4:38 PM  [place] [facility] MAIN [facility] SERVICES [address] [place], [place], [address] Phone: [phone]   Fax:  [phone]

## 2019-08-11 NOTE — Therapy (Signed)
[place] MAIN [facility] SERVICES [address] [place], [place], [address] Phone: [phone]   Fax:  [phone]  Physical Therapy Treatment  Patient Details  Name: Jane Watkins MRN: [id number] Date of Birth: 08-19-1995 Referring Provider (PT): Dr. Earnest Conroy. [name]   Encounter Date: 08/11/2019   PT End of Session - 08/11/19 1252    Visit Number 30    Number of Visits 24    Date for PT Re-Evaluation 08/17/19    Authorization Type [employer], 60 visit limit per discipline    PT Start Time 1120    PT Stop Time 1145    PT Time Calculation (min) 25 min    Equipment Utilized During Treatment Gait belt    Activity Tolerance Patient tolerated treatment well;No increased pain    Behavior During Therapy Flat affect           Past Medical History:  Diagnosis Date  . AKI (acute kidney injury) ([place]) 2018   "from overdose"  . Anxiety   . Chlamydia   . Daily headache   . Depression   . Drug overdose 04/2016   [name] 05/01/2016  . GERD (gastroesophageal reflux disease)    "when I was younger; gone now" (09/21/2017)  . IV drug abuse ([place]) 09/20/2017  . Migraine    "q couple weeks" (09/21/2017)  . Opioid abuse ([place])   . Overdose   . Stroke [facility])     Past Surgical History:  Procedure Laterality Date  . APPENDECTOMY  04/2013  . I & D EXTREMITY Left 09/20/2017   Procedure: IRRIGATION AND DEBRIDEMENT LEFT ARM;  Surgeon: [name], MD;  Location: [place];  Service: Orthopedics;  Laterality: Left;  . TEE WITHOUT CARDIOVERSION N/A 02/10/2019   Procedure: TRANSESOPHAGEAL ECHOCARDIOGRAM (TEE);  Surgeon: [name], MD;  Location: [facility];  Service: Cardiovascular;  Laterality: N/A;  Polysubstance abuser  . TONGUE SURGERY  ~ 2007   "related to lisp"  . TRACHEOSTOMY  04/2016   [name] 05/01/2016    Vitals:   08/11/19 1251  BP: (!) 91/55     Subjective Assessment - 08/11/19 1250    Subjective Pateint is very sleepy and drifting off during  conversations.. She is having increased tone in her left ankle/foot and left arm. She has an appointment with her neurlogist next week. she has bruises on her legs from falling.    Patient is accompained by: --   significant other   Pertinent History Pt is 24 y.o. female presenting to hospital 02/07/19 initially,  MRI showing acute infarct R MCA territory affecting basal ganglia and affecting cortical and subcortical brain in a largely watershed distribution. After in hospital stay patient transitioned to CIR from 02/16/2019-03/01/2019.  PMH includes IV drug abuse, anxiety, depression, migraines, AKI, hypoxic ischemic encephalopathy 2018, opiate overdose 2018, h/o trach 2018, h/o I&D L UE. PLOF: lives with boyfriend in one story apartment, prior to CVA patient was independent, working.    Limitations Walking;Reading;Lifting;Writing;House hold activities;Standing    How long can you sit comfortably? NA    How long can you stand comfortably? 20 mins    How long can you walk comfortably? 20 mins    Diagnostic tests see MRI results    Patient Stated Goals Patient wants to maximize how much she can walk, get back to normal    Pain Onset In the past 7 days           Treatment: Transfer wc <> mat with min assist; unable to  weight bear on LLE and unable to straighten her knee in standing or get her heel to the floor. PROM to L ankle and L hamstring  Patient has swollen L ankle and reports knee pain upon palpation. Educated patient that she is not acting like herself and should go to the ER.                           PT Education - 08/11/19 1251    Education provided Yes    Education Details safety with mobility    Person(s) Educated Patient    Methods Explanation    Comprehension Tactile cues required;Verbal cues required;Need further instruction            PT Short Term Goals - 04/25/19 1627      PT SHORT TERM GOAL #1   Title Pt will be independent with initial HEP in  order to indicate improved strength and decreased fall risk.    Baseline initial HEP administered on eval 03/02/2019; 04/25/19: "not as much as should."    Time 6    Period Weeks    Status Partially Met    Target Date 04/13/19             PT Long Term Goals - 06/13/19 1516      PT LONG TERM GOAL #1   Title Pt will be independent with final HEP in order to indicate improved strength and decreased fall risk.    Baseline initial HEP administered on eval 03/02/19; 04/25/19: "not as much as a should."    Time 12    Period Weeks    Status On-going    Target Date 08/17/19      PT LONG TERM GOAL #2   Title Pt will decrease 5TSTS to less than 10 seconds in order to demonstrate clinically significant improvement in LE strength and functional mobility.    Baseline 03/02/2019: 17 seconds without UE support, dependent on RLE, very little weight shift to LLE; 04/25/19: 7.7s, no UE, decreased weight shifting to LLE;    Time 12    Period Weeks    Status Achieved      PT LONG TERM GOAL #3   Title Pt will perform 6MWT > 1029f with LRAD at mod I level in order to indicate safe community/leisure negotiation.    Baseline 03/02/2019 5325f 04/25/19: 1000'    Time 12    Period Weeks    Status Partially Met    Target Date 08/17/19      PT LONG TERM GOAL #4   Title The patient will demonstrate at least 1/2 grade improvement in LE MMT grade to improve ability to perform functional activities.    Baseline see eval for details 03/02/2019; 04/25/19: see visit note, significant improvement, still lacking L anikle dorsiflexion, eversion, or inversion    Time 12    Period Weeks    Status Partially Met    Target Date 08/17/19      PT LONG TERM GOAL #5   Title Pt will ambulate >1 m/s with LRAD mod I to indicate gait velocity of community ambulator.    Baseline 03/02/19: self selected: .57 m/s, fastest .7221mwith quad cane; 04/25/19: Self-selected: 10.8s = 0.93 m/s; Fastest: 8.8s = 1.14 m/s, performed with no AD     Time 12    Period Weeks    Status Partially Met    Target Date 08/17/19  Plan - 08/11/19 1252    Clinical Impression Statement Patient arrives late to appointment. She is not acting like herself, drowsy and falling to sleep x 2 during treatment . She is advised to go to ER to make sure she is ok. She has a L sided droopy smile - but unable to assess if this is new due to never taking her mask off before to compare. She does not want to go to the ER. Unable to perform 10 th visit progress report due to medical status requiring her needing to go to the ER.     Personal Factors and Comorbidities Behavior Pattern    Examination-Activity Limitations Bathing;Dressing;Sit;Transfers;Sleep;Bed Mobility;Bend;Caring for Others;Carry;Toileting;Reach Overhead;Stand;Locomotion Level;Stairs;Squat;Lift;Hygiene/Grooming    Examination-Participation Restrictions Church;Interpersonal Relationship;Personal Finances;Yard Work;Cleaning;Laundry;School;Community Activity;Medication Management;Driving;Meal Prep;Shop    Rehab Potential Good    Clinical Impairments Affecting Rehab Potential unsure of severity of cognitive/psych deficits    PT Frequency 2x / week    PT Duration 12 weeks    PT Treatment/Interventions ADLs/Self Care Home Management;DME Instruction;Gait training;Stair training;Functional mobility training;Therapeutic activities;Therapeutic exercise;Balance training;Neuromuscular re-education;Cognitive remediation;Patient/family education;Vestibular;Energy conservation;Cryotherapy;Ultrasound;Electrical Stimulation;Fluidtherapy;Iontophoresis 74m/ml Dexamethasone;Canalith Repostioning;Moist Heat;Traction;Passive range of motion;Manual techniques;Joint Manipulations;Spinal Manipulations;Splinting;Taping;Scar mobilization;Wheelchair mobility training;Prosthetic Training;Orthotic Fit/Training;Dry needling;Visual/perceptual remediation/compensation;Aquatic Therapy    PT Next Visit Plan continue  with LE strengthening, NME, and motor control activities    PT Home Exercise Plan Access Code: [email or web address]    Consulted and Agree with Plan of Care Patient           Patient will benefit from skilled therapeutic intervention in order to improve the following deficits and impairments:  Decreased activity tolerance, Decreased balance, Decreased cognition, Decreased endurance, Decreased knowledge of use of DME, Decreased mobility, Decreased strength, Impaired perceived functional ability, Postural dysfunction, Abnormal gait, Difficulty walking, Impaired tone, Decreased range of motion, Decreased coordination, Impaired UE functional use, Pain  Visit Diagnosis: Muscle weakness (generalized)  Other lack of coordination  Right middle cerebral artery stroke (HCC)  Ischemic stroke diagnosed during current admission ([facility]  Acute pain of left shoulder  Other abnormalities of gait and mobility  Frontal lobe and executive function deficit     Problem List Patient Active Problem List   Diagnosis Date Noted  . Tracheostomy status ([place] 07/05/2019  . Major depressive disorder, recurrent episode, severe ([place] 06/21/2019  . Postcoital UTI 05/24/2019  . Spastic hemiparesis ([facility]   . Vascular headache   . Mood disorder in conditions classified elsewhere   . Right middle cerebral artery stroke ([facility] 02/16/2019  . Polysubstance dependence in early, early partial, sustained full, or sustained partial remission ([facility]   . Dyslipidemia   . Ischemic stroke diagnosed during current admission ([facility] 02/13/2019  . MDD (major depressive disorder), recurrent episode, moderate ([facility] 02/10/2019  . CVA (cerebral vascular accident) ([place] [place] 02/07/2019  . TBI (traumatic brain injury) ([facility] 05/01/2016  . 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 Elevated troponin     MAlanson Puls PVirginiaDPT 08/11/2019, 12:54 PM  [place] [facility] [address] [place] [address]Phone: [phone]  Fax:  [phone] Name: LVito BackersMRN: [id number] of Birth: 6December 29, 1997

## 2019-08-11 NOTE — ED Provider Notes (Signed)
-----------------------------------------   2:02 PM on 08/11/2019 -----------------------------------------  Patient was attempting to leave the emergency department when she was stopped by security personnel.  I have personally seen the patient in the parking lot.  She is able to answer questions appropriately.  Was able to tell me the date where she is her name and age.  Patient states she is not at all and wants to leave the emergency department.  States she does not want to be evaluated.  Patient is speaking clearly, no obvious concern for significant confusion.  Does not appear to meet commitment criteria.  Patient is requesting to leave the emergency department without evaluation.  We will allow the patient to leave as requested.   Minna Antis, MD 08/11/19 [phone]

## 2019-08-11 NOTE — ED Notes (Signed)
Through further triage, pt admits to last cocaine and marijuana use yesterday.  Denies recent Heroin use.

## 2019-08-11 NOTE — ED Notes (Signed)
Pt reported that she was leaving.

## 2019-08-11 NOTE — ED Triage Notes (Signed)
Lab reports attempted to get labs from pt but was unsuccessful. Pt refused to be stuck again

## 2019-08-16 ENCOUNTER — Ambulatory Visit: Payer: 59 | Admitting: Family Medicine

## 2019-08-16 ENCOUNTER — Ambulatory Visit: Payer: 59 | Admitting: Physical Therapy

## 2019-08-16 ENCOUNTER — Ambulatory Visit: Payer: 59 | Admitting: Occupational Therapy

## 2019-08-17 ENCOUNTER — Encounter: Payer: 59 | Admitting: Occupational Therapy

## 2019-08-17 ENCOUNTER — Ambulatory Visit: Payer: 59 | Admitting: Family Medicine

## 2019-08-17 ENCOUNTER — Ambulatory Visit: Payer: 59 | Admitting: Physical Therapy

## 2019-08-18 ENCOUNTER — Ambulatory Visit: Payer: 59 | Admitting: Physical Therapy

## 2019-08-18 ENCOUNTER — Ambulatory Visit: Payer: 59 | Admitting: Occupational Therapy

## 2019-08-20 ENCOUNTER — Other Ambulatory Visit: Payer: Self-pay | Admitting: Physical Medicine and Rehabilitation

## 2019-08-23 ENCOUNTER — Ambulatory Visit: Payer: 59 | Admitting: Occupational Therapy

## 2019-08-23 ENCOUNTER — Ambulatory Visit: Payer: 59 | Admitting: Family Medicine

## 2019-08-23 ENCOUNTER — Telehealth: Payer: Self-pay

## 2019-08-23 ENCOUNTER — Ambulatory Visit: Payer: 59 | Admitting: Physical Therapy

## 2019-08-23 NOTE — Telephone Encounter (Signed)
[facility] Night - Client Nonclinical Telephone Record AccessNurse Client [facility] Night - Client Client Site  [facility] [place] - Night Physician Gweneth Dimitri- MD Contact Type Call Who Is Calling Patient / Member / Family / Caregiver Caller Name Jane Watkins Caller Phone Number [phone] Patient Name Jane Watkins Patient DOB [birth year] Call Type Message Only Information Provided Reason for Call Request to [facility] Appointment Initial Comment Caller states she is calling to cancel her appointment. Additional Comment Nurse triage was offered and caller declined. Provided caller with office hours. Disp. Time Disposition Final User 08/22/2019 5:06:37 PM General Information Provided Yes Monyjang, Rudi Call Closed By: Dreama Saa Transaction Date/Time: 08/22/2019 5:04:30 PM (ET

## 2019-08-23 NOTE — Telephone Encounter (Signed)
Attempted to contact patient and verify that they were wanting the appointment to be canceled.

## 2019-08-25 ENCOUNTER — Ambulatory Visit: Payer: 59 | Admitting: Occupational Therapy

## 2019-08-25 ENCOUNTER — Ambulatory Visit: Payer: 59 | Admitting: Physical Therapy

## 2019-08-30 ENCOUNTER — Ambulatory Visit: Payer: 59 | Admitting: Occupational Therapy

## 2019-08-30 ENCOUNTER — Ambulatory Visit: Payer: 59 | Admitting: Physical Therapy

## 2019-08-31 ENCOUNTER — Other Ambulatory Visit: Payer: Self-pay | Admitting: Physical Medicine and Rehabilitation

## 2019-08-31 MED ORDER — TIZANIDINE HCL 4 MG PO TABS: 4.0000 mg | ORAL_TABLET | Freq: Three times a day (TID) | ORAL | 1 refills | Status: DC

## 2019-09-01 ENCOUNTER — Ambulatory Visit: Payer: 59 | Admitting: Physical Therapy

## 2019-09-01 ENCOUNTER — Ambulatory Visit: Payer: 59 | Admitting: Occupational Therapy

## 2019-09-06 ENCOUNTER — Encounter: Payer: Self-pay | Admitting: Physical Therapy

## 2019-09-06 ENCOUNTER — Ambulatory Visit: Payer: 59 | Admitting: Physical Therapy

## 2019-09-06 ENCOUNTER — Ambulatory Visit: Payer: 59 | Admitting: Occupational Therapy

## 2019-09-06 ENCOUNTER — Encounter: Payer: Self-pay | Admitting: Occupational Therapy

## 2019-09-06 DIAGNOSIS — R2689 Other abnormalities of gait and mobility: Secondary | ICD-10-CM

## 2019-09-06 DIAGNOSIS — I63511 Cerebral infarction due to unspecified occlusion or stenosis of right middle cerebral artery: Secondary | ICD-10-CM

## 2019-09-06 DIAGNOSIS — I639 Cerebral infarction, unspecified: Secondary | ICD-10-CM | POA: Diagnosis present

## 2019-09-06 DIAGNOSIS — R278 Other lack of coordination: Secondary | ICD-10-CM

## 2019-09-06 DIAGNOSIS — M25512 Pain in left shoulder: Secondary | ICD-10-CM

## 2019-09-06 DIAGNOSIS — M6281 Muscle weakness (generalized): Secondary | ICD-10-CM

## 2019-09-06 DIAGNOSIS — R41844 Frontal lobe and executive function deficit: Secondary | ICD-10-CM | POA: Diagnosis present

## 2019-09-06 NOTE — Therapy (Signed)
[facility] MAIN [facility] SERVICES [address] [place], [place], [address] Phone: [phone]   Fax:  [phone]  Occupational Therapy Treatment/Recertification   Patient Details  Name: Jane Watkins MRN: [id number] Date of Birth: November 25, 1995 Referring Provider (OT): Dr. [name]   Encounter Date: 09/06/2019   OT End of Session - 09/07/19 0910    Visit Number [id number]    Number of Visits 72    Date for OT Re-Evaluation 12/02/19    Authorization Type Progress report period starting 08/01/2019    OT Start Time 1100    OT Stop Time 1150    OT Time Calculation (min) 50 min    Activity Tolerance Patient tolerated treatment well    Behavior During Therapy Flat affect           Past Medical History:  Diagnosis Date  . AKI (acute kidney injury) (HCC) 2018   "from overdose"  . Anxiety   . Chlamydia   . Daily headache   . Depression   . Drug overdose 04/2016   [name] 05/01/2016  . GERD (gastroesophageal reflux disease)    "when I was younger; gone now" (09/21/2017)  . IV drug abuse (HCC) 09/20/2017  . Migraine    "q couple weeks" (09/21/2017)  . Opioid abuse (HCC)   . Overdose   . Stroke [facility])     Past Surgical History:  Procedure Laterality Date  . APPENDECTOMY  04/2013  . I & D EXTREMITY Left 09/20/2017   Procedure: IRRIGATION AND DEBRIDEMENT LEFT ARM;  Surgeon: Bradly Bienenstock, MD;  Location: [facility] OR;  Service: Orthopedics;  Laterality: Left;  . TEE WITHOUT CARDIOVERSION N/A 02/10/2019   Procedure: TRANSESOPHAGEAL ECHOCARDIOGRAM (TEE);  Surgeon: Debbe Odea, MD;  Location: [facility] ORS;  Service: Cardiovascular;  Laterality: N/A;  Polysubstance abuser  . TONGUE SURGERY  ~ 2007   "related to lisp"  . TRACHEOSTOMY  04/2016   [name] 05/01/2016    There were no vitals filed for this visit.   Subjective Assessment - 09/06/19 1104    Subjective  Pt reports she didn't realize she doesnt have any appointments for next month.  Patient has not  attended therapy for the last 2-3 weeks with multiple no shows.  Reviewed our policy and the need for patient to be consistent with attendance to see progress.  Pt can make appts one at a time and if she is consistent with coming we can add additional appts.  She lives 45 mins away and often late for appts and has lethargy at times.  We discussed potentially looking for services closer to her home for safety and improved compliance.    Pertinent History Per chart. Pt. is a 24 y.o.female with a history of IV drug use, polysubstance as well as tobacco abuse, anxiety. Pt. presented to [facility] on 02/07/2019 after being found unresponsive.  EMS contacted patient received Narcan with some improvement in mental status.  Admission labs with WBC 20,700, potassium 5.2, BUN 25, creatinine 0.67, urine drug screen positive cocaine, marijuana and benzodiazepines, alcohol level less than 10, SARS coronavirus negative.  Cranial CT scan showed areas of indistinct low-density in the right basal ganglia with mass-effect on the right lateral ventricle.  Patient did not receive TPA.  MRI showed acute infarction of right MCA territory, affecting the basal ganglia and affecting the cortical and subcortical brain in a largely watershed distribution. Pt. had a previous inpatient rehab admission 05/01/2016 to 05/09/2016 for hypoxic encephalopathy after drug overdose requiring tracheostomy, and  was later decannulated. Pt. resides with her boyfriend in an apartment. Pt. worked full time as a Production designer, theatre/television/film at [employer]. pt. enjoys travelling.    Patient Stated Goals Patient reports she would like to be as independent as possible with all her daily tasks.    Currently in Pain? Yes    Pain Score 3     Pain Location Hand    Pain Orientation Left    Pain Descriptors / Indicators Aching    Pain Type Chronic pain    Pain Onset More than a month ago    Pain Frequency Intermittent    Multiple Pain Sites No              [facility] OT Assessment -  09/06/19 1106      Assessment   Medical Diagnosis CVA    Referring Provider (OT) Dr. [name]    Onset Date/Surgical Date 02/07/19    Hand Dominance Right    Next MD Visit 03/11/2019    Prior Therapy yes      Coordination   9 Hole Peg Test Right;Left    Right 9 Hole Peg Test 22    Left 9 Hole Peg Test unable    pt. can pick up and place one peg only.       Hand Function   Right Hand Grip (lbs) 45    Right Hand Lateral Pinch 18 lbs    Right Hand 3 Point Pinch 17 lbs    Left Hand Grip (lbs) 10    Left Hand Lateral Pinch 7 lbs    Left 3 point pinch 7 lbs            Patient has not attended therapy for the last 2-3 weeks with multiple no shows and cancellations.  Reviewed our policy and the need for patient to be consistent with attendance to see progress.  Pt can make appts one at a time and if she is consistent with coming we can add additional appts.  She lives 45 mins away and often late for appts and has lethargy at times placing her at risk with driving.  We discussed potentially looking for services closer to her home for safety and improved compliance.  She will discuss with her mom and will let us know on Thursday what she would like to do.    Reassessment of skills as per flowsheet above.   Continues to demonstrate increased spasticity in left dominant hand limiting her use and function. Pt is also having difficulty with walking and multiple falls in recent weeks.      Able to grip steering wheel and also able to steer the car more now with left hand.   Increased tone and will be receiving Botox injection on Wednesday.  Still has difficulty with opening hand, extending fingers consistently  Starting to initiate more use of left UE during daily tasks.   Still doesn't do a lot of cooking but able to hold and stabilize containers at times.    Difficulty with buttoning pants  Unable to tie shoes Unable to put on earrings Difficulty  with folding shirts Unable to sweep with  broom and trying to manage a dustpan Unable to wash dishes with thoroughness  Bathing modified independent In and out of shower independent Dressing with occasional assist for buttons She can go to the store to get snacks and drinks with supervision Assist with bringing items into the house from the car Medication management independent with sorting, needs assist to  open bottles Handwriting left dominant, difficulty holding pen with left hand and unable to write letters of her name  Opposition to index and MF, difficulty/unable to ring finger Supination to 45 degrees Finger extension but lacks full extension at MP  Of left hand Elbow extension-55 in sitting -20 standing in standing with increased time and effort to move Wrist left 40 extension with effort 30 degrees wrist flexion with effort    Response to tx: Patient demonstrating difficulty with walking this date, unable to bear weight on left foot.  She has also shown a decline in function and measurements with not attending therapy for the last 3 weeks.  We discussed the need to be consistent to show progress and the concern over safety with driving since she lives 45 mins away and is lethargic at times.  She is considering looking for another facility closer to where she lives so shorten the drive so she can be more consistent with attendance and have more options for times later in the afternoon since she has difficulty getting up in the mornings.  She now lives with her mom and brother in [place].  She is mostly independent with basic self care tasks but has difficulty with managing buttons.  She is trying to engage in more IADL tasks at home but has to modify and perform most tasks with right hand.  Patient continues to benefit from skilled OT services to maximize her safety and independence in daily tasks.  Patient to make some calls to see if there are facilities near her that offer both OT and PT services and later day appts.           OT Education - 09/07/19 0910    Education Details reassessment of skills, goals, plan of care    Person(s) Educated Patient    Methods Explanation;Demonstration;Tactile cues;Verbal cues;Handout    Comprehension Verbalized understanding;Returned demonstration;Need further instruction               OT Long Term Goals - 09/06/19 1115      OT LONG TERM GOAL #1   Title Pt. will increase active isolated left shoulder flexion by 20 degrees in preparation for combing her hair.    Baseline 09/06/2019: shoulder flex 152.  06/09/2019: Shoulder flexion 152. 04/25/2019: Shoulder flexion 132. Pt. is unable to use her left hand to brush her hair. Eval: No isolated shoulder shoulder flexion. Pt. compensates with synergystic movement with attempts for flexion.    Time 12    Period Weeks    Status Achieved      OT LONG TERM GOAL #2   Title Pt. will increase active shoulder abduction by 20 degrees in preparation to be able to wash her hair.    Baseline 09/06/2019:  118 degrees able to wash her hair and rinse it    Time 12    Period Weeks    Status Achieved      OT LONG TERM GOAL #3   Title Pt. will improve hand to face patterns using her left hand to be able to independently wash her face.    Baseline 05/30/2019: Pt. is able to wash her face with a wash rag using her left hand. 04/25/2019: Pt. is able to perfrom hand to face patterns with her left hand, however is not able to use for left hand to wash her face.Eval: Pt. is unable to wash her face using her left hand    Time 12    Period Weeks  Status Achieved      OT LONG TERM GOAL #4   Title Pt. will increase AROM wrist extension by 10 degrees in preparation for reaching for a cup    Baseline 05/30/2019: 54 degrees. Pt. is able to reach for a cup. 04/25/2019: Wrist extension 54. Pt. conitnues to work to reaching for a cup with her left hand. Eval: 0 degrees of active wrist extension.    Time 12    Period Weeks    Status Achieved       OT LONG TERM GOAL #5   Title Pt. will increase left digit flexion to be able to initiate actively holding a toothbrush.    Baseline 05/30/2019: Pt. is able to hold, and use a toothbrush. 04/25/2019: Pt. is indpendently able to hold, and stabilize a toorhbursh with her left hand while applying toothpaste. pt. is unable to use her left hand to brush her teeth. Eval: Pt. is unable to use her left hand to hold a toothbrush.    Time 12    Period Weeks    Status Achieved      Long Term Additional Goals   Additional Long Term Goals Yes      OT LONG TERM GOAL #6   Title Pt. will assume a mature tripod grasp on a pen while writing her name wiht 100% legibility.    Baseline 09/06/2019 still unable to form name with left 05/30/2019: Pt. is unable to assume a mature tripod grasp on a pen when drawing a line.    Time 12    Period Weeks    Status On-going    Target Date 12/02/19      OT LONG TERM GOAL #7   Title Pt. will independently type a 3 sentence email message efficiently with 100% accuracy    Baseline 05/30/2019: Pt. is unable. 09/06/2019 still unable to type with left hand.    Time 12    Period Weeks    Status On-going    Target Date 12/02/19      OT LONG TERM GOAL #8   Title Pt. will improve left hand [facility] skills in order to independentlymanipulate small objects needed during ADLs, and IADLs,    Baseline 06/09/2019: Left hand [facility] skills are limited. 09/06/2019 left hand remains limited and increased spasticity.    Time 12    Period Weeks    Status On-going    Target Date 12/02/19      OT LONG TERM GOAL  #9   TITLE Pt. will use her left hand to independently handle, and use cooking, and baking utensils.    Baseline 05/30/2019: Pt. has difficulty.  09/06/2019 continued difficulty with tasks in the kitchen    Time 12    Period Weeks    Status On-going    Target Date 12/02/19      OT LONG TERM GOAL  #10   TITLE Patient will improve left hand function to complete buttons and zippers with  modified independence.    Baseline difficulty with buttoning pants and shirts    Time 12    Period Weeks    Status New    Target Date 12/02/19      OT LONG TERM GOAL  #11   TITLE Pt will demonstrate ability to fold shirts with modified techniques.    Baseline unable to fold shirts    Time 12    Period Weeks    Status New    Target Date 12/02/19  OT LONG TERM GOAL  #12   TITLE Patient will complete sweeping with broom and manage a dustpan with min assist    Baseline unable to perform    Time 12    Status New    Target Date 12/02/19                 Plan - 09/07/19 1015    Clinical Impression Statement Patient demonstrating difficulty with walking this date, unable to bear weight on left foot.  She has also shown a decline in function and measurements with not attending therapy for the last 3 weeks.  We discussed the need to be consistent to show progress and the concern over safety with driving since she lives 45 mins away and is lethargic at times.  She is considering looking for another facility closer to where she lives so shorten the drive so she can be more consistent with attendance and have more options for times later in the afternoon since she has difficulty getting up in the mornings.  She now lives with her mom and brother in [place].  She is mostly independent with basic self care tasks but has difficulty with managing buttons.  She is trying to engage in more IADL tasks at home but has to modify and perform most tasks with right hand.  Patient continues to benefit from skilled OT services to maximize her safety and independence in daily tasks.  Patient to make some calls to see if there are facilities near her that offer both OT and PT services and later day appts.    OT Occupational Profile and History Detailed Assessment- Review of Records and additional review of physical, cognitive, psychosocial history related to current functional performance    Occupational  performance deficits (Please refer to evaluation for details): ADL's;IADL's    Body Structure / Function / Physical Skills ADL;IADL;Coordination;Endurance;UE functional use;Decreased knowledge of precautions;Dexterity;FMC;ROM;Tone;Strength    Rehab Potential Good    Clinical Decision Making Several treatment options, min-mod task modification necessary    Comorbidities Affecting Occupational Performance: May have comorbidities impacting occupational performance    Modification or Assistance to Complete Evaluation  Min-Moderate modification of tasks or assist with assess necessary to complete eval    OT Frequency 2x / week    OT Duration 12 weeks    OT Treatment/Interventions Self-care/ADL training;DME and/or AE instruction;Energy conservation;Therapeutic activities;Patient/family education;Therapeutic exercise;Neuromuscular education    Consulted and Agree with Plan of Care Patient           Patient will benefit from skilled therapeutic intervention in order to improve the following deficits and impairments:   Body Structure / Function / Physical Skills: ADL, IADL, Coordination, Endurance, UE functional use, Decreased knowledge of precautions, Dexterity, FMC, ROM, Tone, Strength       Visit Diagnosis: Muscle weakness (generalized)  Other lack of coordination  Right middle cerebral artery stroke [facility](HCC)    Problem List Patient Active Problem List   Diagnosis Date Noted  . 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  . Major depressive disorder, recurrent episode, severe (HCC) 06/21/2019  . Postcoital UTI 05/24/2019  . Spastic hemiparesis (HCC)   . Vascular headache   . Mood disorder in conditions classified elsewhere   . 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 Polysubstance dependence in early, early partial, sustained full, or sustained partial remission (HCC)   . Dyslipidemia   . Ischemic stroke diagnosed during current admission (HCC) 02/13/2019  . MDD (major  depressive disorder), recurrent episode, moderate (  HCC) 02/10/2019  . CVA (cerebral vascular accident) (HCC) 02/07/2019  . TBI (traumatic brain injury) (HCC) 05/01/2016  . 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 Elevated troponin    Kerrie Buffalo, OTR/L, CLT  Renley Banwart 09/07/2019, 10:21 AM  [facility] MAIN [facility] SERVICES [address] [place], [place], [address] Phone: [phone]   Fax:  [phone]  Name: Jane Watkins MRN: [id number] Date of Birth: [birth year]

## 2019-09-06 NOTE — Therapy (Signed)
[place] MAIN [facility] SERVICES [address] [place], [place], [address] Phone: [phone]   Fax:  [phone]  Physical Therapy Treatment  Patient Details  Name: Jane Watkins MRN: [id number] Date of Birth: 19-Feb-1995 Referring Provider (PT): Dr. Earnest Conroy. [name]   Encounter Date: 09/06/2019   PT End of Session - 09/06/19 1203    Visit Number 31    Number of Visits 24    Date for PT Re-Evaluation 08/17/19    Authorization Type UHC, 60 visit limit per discipline    PT Start Time 1130    PT Stop Time 1145    PT Time Calculation (min) 15 min    Equipment Utilized During Treatment Gait belt    Activity Tolerance Patient tolerated treatment well;No increased pain    Behavior During Therapy Flat affect           Past Medical History:  Diagnosis Date  . AKI (acute kidney injury) ([place]) 2018   "from overdose"  . Anxiety   . Chlamydia   . Daily headache   . Depression   . Drug overdose 04/2016   [name] 05/01/2016  . GERD (gastroesophageal reflux disease)    "when I was younger; gone now" (09/21/2017)  . IV drug abuse ([place]) 09/20/2017  . Migraine    "q couple weeks" (09/21/2017)  . Opioid abuse ([place])   . Overdose   . Stroke [facility])     Past Surgical History:  Procedure Laterality Date  . APPENDECTOMY  04/2013  . I & D EXTREMITY Left 09/20/2017   Procedure: IRRIGATION AND DEBRIDEMENT LEFT ARM;  Surgeon: [name], MD;  Location: [place];  Service: Orthopedics;  Laterality: Left;  . TEE WITHOUT CARDIOVERSION N/A 02/10/2019   Procedure: TRANSESOPHAGEAL ECHOCARDIOGRAM (TEE);  Surgeon: [name], MD;  Location: [facility];  Service: Cardiovascular;  Laterality: N/A;  Polysubstance abuser  . TONGUE SURGERY  ~ 2007   "related to lisp"  . TRACHEOSTOMY  04/2016   [name] 05/01/2016    There were no vitals filed for this visit.   Subjective Assessment - 09/06/19 1139    Subjective Patient is having L knee pain 6/10. Marland Kitchen She is having  increased tone in her left ankle/foot and left arm..She has bruises on her legs from falling.    Patient is accompained by: --   significant other   Pertinent History Pt is 24 y.o. female presenting to hospital 02/07/19 initially,  MRI showing acute infarct R MCA territory affecting basal ganglia and affecting cortical and subcortical brain in a largely watershed distribution. After in hospital stay patient transitioned to CIR from 02/16/2019-03/01/2019.  PMH includes IV drug abuse, anxiety, depression, migraines, AKI, hypoxic ischemic encephalopathy 2018, opiate overdose 2018, h/o trach 2018, h/o I&D L UE. PLOF: lives with boyfriend in one story apartment, prior to CVA patient was independent, working.    Limitations Walking;Reading;Lifting;Writing;House hold activities;Standing    How long can you sit comfortably? NA    How long can you stand comfortably? 20 mins    How long can you walk comfortably? 20 mins    Diagnostic tests see MRI results    Patient Stated Goals Patient wants to maximize how much she can walk, get back to normal    Pain Score 6     Pain Location Knee    Pain Orientation Left    Pain Descriptors / Indicators Aching    Pain Onset More than a month ago    Pain Frequency Intermittent  Aggravating Factors  leg ext/ knee ext    Pain Relieving Factors stretching    Effect of Pain on Daily Activities unable to walk    Multiple Pain Sites No            Treatment: PROM to L ankle and L knee x 30 sec x 3 reps L knee to chest AAROM due to increased tone x 10 reps STM to L quad for increased tone to be able to reduce quad tone with roller stick x 3 mins Patient has pain and discomfort during stretching in L  knee and ankle Discussed patient being late to appointments consistently and missing appointments/ cancelling appointments and now will not be scheduled out with appointments. Patient will be able to make appointments one at a time  Discussed patient needing to make  appointment for a new ankle brace to prevent contractures due to her not being able to wear her AFO secondary to increased tone.                            PT Education - 09/06/19 1141    Education provided Yes    Education Details need of a night ankle brace due to not being able to wear her AFO due to tone, possibility of developing a foot contracture    Person(s) Educated Patient    Methods Explanation    Comprehension Verbal cues required;Need further instruction            PT Short Term Goals - 04/25/19 1627      PT SHORT TERM GOAL #1   Title Pt will be independent with initial HEP in order to indicate improved strength and decreased fall risk.    Baseline initial HEP administered on eval 03/02/2019; 04/25/19: "not as much as should."    Time 6    Period Weeks    Status Partially Met    Target Date 04/13/19             PT Long Term Goals - 06/13/19 1516      PT LONG TERM GOAL #1   Title Pt will be independent with final HEP in order to indicate improved strength and decreased fall risk.    Baseline initial HEP administered on eval 03/02/19; 04/25/19: "not as much as a should."    Time 12    Period Weeks    Status On-going    Target Date 08/17/19      PT LONG TERM GOAL #2   Title Pt will decrease 5TSTS to less than 10 seconds in order to demonstrate clinically significant improvement in LE strength and functional mobility.    Baseline 03/02/2019: 17 seconds without UE support, dependent on RLE, very little weight shift to LLE; 04/25/19: 7.7s, no UE, decreased weight shifting to LLE;    Time 12    Period Weeks    Status Achieved      PT LONG TERM GOAL #3   Title Pt will perform 6MWT > 109f with LRAD at mod I level in order to indicate safe community/leisure negotiation.    Baseline 03/02/2019 5334f 04/25/19: 1000'    Time 12    Period Weeks    Status Partially Met    Target Date 08/17/19      PT LONG TERM GOAL #4   Title The patient will  demonstrate at least 1/2 grade improvement in LE MMT grade to improve ability to perform functional activities.  Baseline see eval for details 03/02/2019; 04/25/19: see visit note, significant improvement, still lacking L anikle dorsiflexion, eversion, or inversion    Time 12    Period Weeks    Status Partially Met    Target Date 08/17/19      PT LONG TERM GOAL #5   Title Pt will ambulate >1 m/s with LRAD mod I to indicate gait velocity of community ambulator.    Baseline 03/02/19: self selected: .57 m/s, fastest .56ms with quad cane; 04/25/19: Self-selected: 10.8s = 0.93 m/s; Fastest: 8.8s = 1.14 m/s, performed with no AD    Time 12    Period Weeks    Status Partially Met    Target Date 08/17/19                 Plan - 09/06/19 1204    Clinical Impression Statement Patient arrives 30 mins late to appointment. Her 10th visit progress report was due last visit and then this visit but unable to do the progress report due to being late for appointment. She was educated about policy of repeated missed appointments and being late to appointments, and not being able to schedule out future appointments. She has another appointment this week and then will need to be scheduled on a appointment by appointment basis. She has increased tone in left ankle and knee today and has PROM to stretch L knee and ankle. She is educated that she might need a night time brace for L ankle to prevent a contracture from forming in L ankle. She was recommended to call her prosthesthis to make an appointment and see if she needs a different daytime ankle brace and a different night time brace.    Personal Factors and Comorbidities Behavior Pattern    Examination-Activity Limitations Bathing;Dressing;Sit;Transfers;Sleep;Bed Mobility;Bend;Caring for Others;Carry;Toileting;Reach Overhead;Stand;Locomotion Level;Stairs;Squat;Lift;Hygiene/Grooming    Examination-Participation Restrictions Church;Interpersonal  Relationship;Personal Finances;Yard Work;Cleaning;Laundry;School;Community Activity;Medication Management;Driving;Meal Prep;Shop    Rehab Potential Good    Clinical Impairments Affecting Rehab Potential unsure of severity of cognitive/psych deficits    PT Frequency 2x / week    PT Duration 12 weeks    PT Treatment/Interventions ADLs/Self Care Home Management;DME Instruction;Gait training;Stair training;Functional mobility training;Therapeutic activities;Therapeutic exercise;Balance training;Neuromuscular re-education;Cognitive remediation;Patient/family education;Vestibular;Energy conservation;Cryotherapy;Ultrasound;Electrical Stimulation;Fluidtherapy;Iontophoresis 424mml Dexamethasone;Canalith Repostioning;Moist Heat;Traction;Passive range of motion;Manual techniques;Joint Manipulations;Spinal Manipulations;Splinting;Taping;Scar mobilization;Wheelchair mobility training;Prosthetic Training;Orthotic Fit/Training;Dry needling;Visual/perceptual remediation/compensation;Aquatic Therapy    PT Next Visit Plan continue with LE strengthening, NME, and motor control activities    PT Home Exercise Plan Access Code: [email or web address]    Consulted and Agree with Plan of Care Patient           Patient will benefit from skilled therapeutic intervention in order to improve the following deficits and impairments:  Decreased activity tolerance, Decreased balance, Decreased cognition, Decreased endurance, Decreased knowledge of use of DME, Decreased mobility, Decreased strength, Impaired perceived functional ability, Postural dysfunction, Abnormal gait, Difficulty walking, Impaired tone, Decreased range of motion, Decreased coordination, Impaired UE functional use, Pain  Visit Diagnosis: Other lack of coordination  Muscle weakness (generalized)  Right middle cerebral artery stroke (HCC)  Ischem[facility] Acute pain of left shoulder  Other abnormalities of gait and  mobility  Frontal lobe and executive function deficit     Problem List Patient Active Problem List   Diagnosis Date Noted  . Tracheostomy status (HCM[place] Beach05/25/2021  . Major depressive disorder, recurrent episode, severe (HCGrand Marais05/12/2019  . Postcoital UTI 05/24/2019  . Spastic hemiparesis (HCSabana Seca  . Vascular headache   .  Mood disorder in conditions classified elsewhere   . Right middle cerebral artery stroke ([place]) 02/16/2019  . Polysubstance dependence in early, early partial, sustained full, or sustained partial remission ([place])   . Dyslipidemia   . Ischemic stroke diagnosed during current admission ([place]) 02/13/2019  . MDD (major depressive disorder), recurrent episode, moderate ([place]) 02/10/2019  . CVA (cerebral vascular accident) ([place]) 02/07/2019  . TBI (traumatic brain injury) ([place]) 05/01/2016  . 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 Elevated troponin     Alanson Puls, [place] DPT 09/06/2019, 1:00 PM  [place] MAIN [facility] SERVICES [address] [place], [place], [address] Phone: [phone]   Fax:  [phone]  Name: Jane Watkins MRN: [id number] Date of Birth: Feb 16, 1995

## 2019-09-07 ENCOUNTER — Encounter: Payer: Self-pay | Admitting: Physical Medicine and Rehabilitation

## 2019-09-07 ENCOUNTER — Other Ambulatory Visit: Payer: Self-pay

## 2019-09-07 ENCOUNTER — Encounter: Payer: 59 | Attending: Physical Medicine and Rehabilitation | Admitting: Physical Medicine and Rehabilitation

## 2019-09-07 VITALS — BP 90/58 | HR 100 | Temp 98.1°F | Ht 62.0 in | Wt 109.0 lb

## 2019-09-07 DIAGNOSIS — I69354 Hemiplegia and hemiparesis following cerebral infarction affecting left non-dominant side: Secondary | ICD-10-CM | POA: Insufficient documentation

## 2019-09-07 DIAGNOSIS — R279 Unspecified lack of coordination: Secondary | ICD-10-CM | POA: Insufficient documentation

## 2019-09-07 DIAGNOSIS — I69398 Other sequelae of cerebral infarction: Secondary | ICD-10-CM | POA: Insufficient documentation

## 2019-09-07 DIAGNOSIS — R2689 Other abnormalities of gait and mobility: Secondary | ICD-10-CM | POA: Diagnosis not present

## 2019-09-07 DIAGNOSIS — F1911 Other psychoactive substance abuse, in remission: Secondary | ICD-10-CM | POA: Diagnosis not present

## 2019-09-07 DIAGNOSIS — G8110 Spastic hemiplegia affecting unspecified side: Secondary | ICD-10-CM

## 2019-09-07 DIAGNOSIS — I69314 Frontal lobe and executive function deficit following cerebral infarction: Secondary | ICD-10-CM | POA: Insufficient documentation

## 2019-09-07 DIAGNOSIS — M25512 Pain in left shoulder: Secondary | ICD-10-CM | POA: Diagnosis not present

## 2019-09-07 DIAGNOSIS — F332 Major depressive disorder, recurrent severe without psychotic features: Secondary | ICD-10-CM | POA: Diagnosis not present

## 2019-09-07 DIAGNOSIS — F411 Generalized anxiety disorder: Secondary | ICD-10-CM | POA: Insufficient documentation

## 2019-09-07 DIAGNOSIS — Z930 Tracheostomy status: Secondary | ICD-10-CM | POA: Insufficient documentation

## 2019-09-07 DIAGNOSIS — G44221 Chronic tension-type headache, intractable: Secondary | ICD-10-CM | POA: Diagnosis not present

## 2019-09-07 MED ORDER — FLUOXETINE HCL 40 MG PO CAPS: 40.0000 mg | ORAL_CAPSULE | Freq: Every day | ORAL | 3 refills | Status: DC

## 2019-09-07 MED ORDER — BACLOFEN 5 MG PO TABS: 10.0000 mg | ORAL_TABLET | Freq: Two times a day (BID) | ORAL | 4 refills | Status: DC

## 2019-09-07 MED ORDER — FLUOXETINE HCL 10 MG PO CAPS: 10.0000 mg | ORAL_CAPSULE | Freq: Every day | ORAL | 3 refills | Status: DC

## 2019-09-07 NOTE — Progress Notes (Signed)
Botox Injection for spasticity using US guidance  Indication: Severe spasticity which interferes with ADL,mobility and/or  hygiene and is unresponsive to medication management and other conservative care Informed consent was obtained after describing risks and benefits of the procedure with the patient. This includes bleeding, bruising, infection, excessive weakness, or medication side effects. A REMS form is on file and signed. 200 U were injected as follows: Left soleus: 75 U divided into 3 sites Left gastrocnemius: 75 U divided into 3 sites Left flexor pollicis longus: 20U Left adductor pollicis: 10U.  Left flexor digitorum profundus: 20U  All injections were done after obtaining appropriate EMG activity and after negative drawback for blood. The patient tolerated the procedure well. Post procedure instructions were given. A followup appointment was made.

## 2019-09-08 ENCOUNTER — Ambulatory Visit: Payer: 59 | Admitting: Physical Therapy

## 2019-09-08 ENCOUNTER — Ambulatory Visit: Payer: 59 | Admitting: Occupational Therapy

## 2019-09-08 ENCOUNTER — Encounter: Payer: Self-pay | Admitting: Occupational Therapy

## 2019-09-08 ENCOUNTER — Encounter: Payer: Self-pay | Admitting: Physical Therapy

## 2019-09-08 DIAGNOSIS — I63511 Cerebral infarction due to unspecified occlusion or stenosis of right middle cerebral artery: Secondary | ICD-10-CM

## 2019-09-08 DIAGNOSIS — R2689 Other abnormalities of gait and mobility: Secondary | ICD-10-CM

## 2019-09-08 DIAGNOSIS — R41844 Frontal lobe and executive function deficit: Secondary | ICD-10-CM

## 2019-09-08 DIAGNOSIS — M6281 Muscle weakness (generalized): Secondary | ICD-10-CM

## 2019-09-08 DIAGNOSIS — R278 Other lack of coordination: Secondary | ICD-10-CM

## 2019-09-08 DIAGNOSIS — M25512 Pain in left shoulder: Secondary | ICD-10-CM

## 2019-09-08 DIAGNOSIS — I639 Cerebral infarction, unspecified: Secondary | ICD-10-CM

## 2019-09-08 NOTE — Therapy (Addendum)
[place] MAIN [facility] SERVICES [address] [place], [place], [address] Phone: [phone]   Fax:  [phone]  Physical Therapy Treatment  Patient Details  Name: Jane Watkins MRN: [id number] Date of Birth: November 21, 1995 Referring Provider (PT): Dr. Earnest Conroy. [name]   Encounter Date: 09/08/2019   PT End of Session - 09/08/19 1344    Visit Number 32    Number of Visits 24    Date for PT Re-Evaluation 11/09/2019   Authorization Type UHC, 60 visit limit per discipline    PT Start Time 1045    PT Stop Time 1100    PT Time Calculation (min) 15 min    Equipment Utilized During Treatment Gait belt    Activity Tolerance Patient tolerated treatment well;No increased pain    Behavior During Therapy Flat affect           Past Medical History:  Diagnosis Date  . AKI (acute kidney injury) ([place]) 2018   "from overdose"  . Anxiety   . Chlamydia   . Daily headache   . Depression   . Drug overdose 04/2016   [name] 05/01/2016  . GERD (gastroesophageal reflux disease)    "when I was younger; gone now" (09/21/2017)  . IV drug abuse ([place]) 09/20/2017  . Migraine    "q couple weeks" (09/21/2017)  . Opioid abuse ([place])   . Overdose   . Stroke [facility])     Past Surgical History:  Procedure Laterality Date  . APPENDECTOMY  04/2013  . I & D EXTREMITY Left 09/20/2017   Procedure: IRRIGATION AND DEBRIDEMENT LEFT ARM;  Surgeon: [name], MD;  Location: [place];  Service: Orthopedics;  Laterality: Left;  . TEE WITHOUT CARDIOVERSION N/A 02/10/2019   Procedure: TRANSESOPHAGEAL ECHOCARDIOGRAM (TEE);  Surgeon: [name], MD;  Location: [facility];  Service: Cardiovascular;  Laterality: N/A;  Polysubstance abuser  . TONGUE SURGERY  ~ 2007   "related to lisp"  . TRACHEOSTOMY  04/2016   [name] 05/01/2016    There were no vitals filed for this visit.   Subjective Assessment - 09/08/19 1223    Subjective Patient arrives 30 minutes late for appointment. Patient is  having less L knee pain 3/10. She had botox for her L calf/hand.. She has bruises on her legs from falling.    Patient is accompained by: --   significant other   Pertinent History Pt is 24 y.o. female presenting to hospital 02/07/19 initially,  MRI showing acute infarct R MCA territory affecting basal ganglia and affecting cortical and subcortical brain in a largely watershed distribution. After in hospital stay patient transitioned to CIR from 02/16/2019-03/01/2019.  PMH includes IV drug abuse, anxiety, depression, migraines, AKI, hypoxic ischemic encephalopathy 2018, opiate overdose 2018, h/o trach 2018, h/o I&D L UE. PLOF: lives with boyfriend in one story apartment, prior to CVA patient was independent, working.    Limitations Walking;Reading;Lifting;Writing;House hold activities;Standing    How long can you sit comfortably? NA    How long can you stand comfortably? 20 mins    How long can you walk comfortably? 20 mins    Diagnostic tests see MRI results    Patient Stated Goals Patient wants to maximize how much she can walk, get back to normal    Currently in Pain? Yes    Pain Score 3     Pain Location Knee    Pain Orientation Left    Pain Descriptors / Indicators Aching    Pain Onset More than a  month ago           Treatment: PROM to L ankle and L knee x 30 sec x 3 reps L knee to chest AAROM due to increased tone x 10 reps STM to L hamstring for increased tone to be able to reduce tone x 3 mins Prone knee flex x 5 reps for 30 sec to stretch L hip flexor  Discussed that she needs to call for a new ankle brace to prevent contractures due to her not being able to wear her AFO secondary to increased tone.                           PT Education - 09/08/19 1343    Education provided Yes    Education Details need to make appointment for new brace    Person(s) Educated Patient    Methods Explanation    Comprehension Verbalized understanding            PT Short  Term Goals - 04/25/19 1627      PT SHORT TERM GOAL #1   Title Pt will be independent with initial HEP in order to indicate improved strength and decreased fall risk.    Baseline initial HEP administered on eval 03/02/2019; 04/25/19: "not as much as should."    Time 6    Period Weeks    Status Partially Met    Target Date 04/13/19             PT Long Term Goals - 09/08/19 1619      PT LONG TERM GOAL #1   Title Pt will be independent with final HEP in order to indicate improved strength and decreased fall risk.    Baseline initial HEP administered on eval 03/02/19; 04/25/19: "not as much as a should."    Time 12    Period Weeks    Status On-going    Target Date 11/09/19      PT LONG TERM GOAL #2   Title Pt will decrease 5TSTS to less than 10 seconds in order to demonstrate clinically significant improvement in LE strength and functional mobility.    Baseline 03/02/2019: 17 seconds without UE support, dependent on RLE, very little weight shift to LLE; 04/25/19: 7.7s, no UE, decreased weight shifting to LLE;    Time 12    Period Weeks    Status Achieved      PT LONG TERM GOAL #3   Title Pt will perform 6MWT > 1065f with LRAD at mod I level in order to indicate safe community/leisure negotiation.    Baseline 03/02/2019 5383f 04/25/19: 1000'    Time 12    Period Weeks    Status Partially Met    Target Date 11/09/19      PT LONG TERM GOAL #4   Title The patient will demonstrate at least 1/2 grade improvement in LE MMT grade to improve ability to perform functional activities.    Baseline see eval for details 03/02/2019; 04/25/19: see visit note, significant improvement, still lacking L anikle dorsiflexion, eversion, or inversion    Time 12    Period Weeks    Status Partially Met    Target Date 11/09/19      PT LONG TERM GOAL #5   Title Pt will ambulate >1 m/s with LRAD mod I to indicate gait velocity of community ambulator.    Baseline 03/02/19: self selected: .57 m/s, fastest  .7254mwith quad cane; 04/25/19:  Self-selected: 10.8s = 0.93 m/s; Fastest: 8.8s = 1.14 m/s, performed with no AD    Time 12    Period Weeks    Status Partially Met    Target Date 11/09/19      PT LONG TERM GOAL #6   Time 12    Status On-going   11/09/2019                Plan - 09/08/19 1345    Clinical Impression Statement Patient arrives 30 mins late to appointment. Her 10th visit progress report was due 2 visits ago and then this visit but unable to do the progress report due to being late for appointment 3 times in a row. She has increased tone in left ankle and knee today and has PROM to stretch L knee and ankle. She is educated that she might need a night time brace for L ankle.Marland Kitchen She was recommended to call her prosthesthis to make an appointment and see if she needs a different daytime ankle brace and a different night time brace. She will continue to benefit from skilled PT to improve functional gait and strength.    Personal Factors and Comorbidities Behavior Pattern    Examination-Activity Limitations Bathing;Dressing;Sit;Transfers;Sleep;Bed Mobility;Bend;Caring for Others;Carry;Toileting;Reach Overhead;Stand;Locomotion Level;Stairs;Squat;Lift;Hygiene/Grooming    Examination-Participation Restrictions Church;Interpersonal Relationship;Personal Finances;Yard Work;Cleaning;Laundry;School;Community Activity;Medication Management;Driving;Meal Prep;Shop    Rehab Potential Good    Clinical Impairments Affecting Rehab Potential unsure of severity of cognitive/psych deficits    PT Frequency 2x / week    PT Duration 12 weeks    PT Treatment/Interventions ADLs/Self Care Home Management;DME Instruction;Gait training;Stair training;Functional mobility training;Therapeutic activities;Therapeutic exercise;Balance training;Neuromuscular re-education;Cognitive remediation;Patient/family education;Vestibular;Energy conservation;Cryotherapy;Ultrasound;Electrical  Stimulation;Fluidtherapy;Iontophoresis 10m/ml Dexamethasone;Canalith Repostioning;Moist Heat;Traction;Passive range of motion;Manual techniques;Joint Manipulations;Spinal Manipulations;Splinting;Taping;Scar mobilization;Wheelchair mobility training;Prosthetic Training;Orthotic Fit/Training;Dry needling;Visual/perceptual remediation/compensation;Aquatic Therapy    PT Next Visit Plan continue with LE strengthening, NME, and motor control activities    PT Home Exercise Plan Access Code: [email or web address]    Consulted and Agree with Plan of Care Patient           Patient will benefit from skilled therapeutic intervention in order to improve the following deficits and impairments:  Decreased activity tolerance, Decreased balance, Decreased cognition, Decreased endurance, Decreased knowledge of use of DME, Decreased mobility, Decreased strength, Impaired perceived functional ability, Postural dysfunction, Abnormal gait, Difficulty walking, Impaired tone, Decreased range of motion, Decreased coordination, Impaired UE functional use, Pain  Visit Diagnosis: Muscle weakness (generalized)  Other lack of coordination  Right middle cerebral artery stroke (HCC)  Ischemic stroke diagnosed during current admission ([facility]  Acute pain of left shoulder  Other abnormalities of gait and mobility  Frontal lobe and executive function deficit     Problem List Patient Active Problem List   Diagnosis Date Noted  . Tracheostomy status (HPlanada 07/05/2019  . Major depressive disorder, recurrent episode, severe (HC[place] 06/21/2019  . Postcoital UTI 05/24/2019  . Spastic hemiparesis (HBlythe   . Vascular headache   . Mood disorder in conditions classified elsewhere   . Right middle cerebral artery stroke (HW[place] 02/16/2019  . Polysubstance dependence in early, early partial, sustained full, or sustained partial remission (HB[place]   . Dyslipidemia   . Ischemic stroke diagnosed during current admission ([place] 02/13/2019  . MDD  (major depressive disorder), recurrent episode, moderate (HHead[place] 02/10/2019  . CVA (cerebral vascular accident) (HEast [place] 02/07/2019  . TBI (traumatic brain injury) (HLong [place] 05/01/2016  . 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 Elevated troponin     MAlanson Puls PT DPT 09/08/2019, 4:23 PM  [place] MAIN [facility] SERVICES [address] [place], [place], [address] Phone: [phone]   Fax:  [phone]  Name: Jane Watkins MRN: [id number] Date of Birth: April 19, 1995

## 2019-09-08 NOTE — Therapy (Signed)
[facility] MAIN [facility] SERVICES [address] [place], [place], [address] Phone: [phone]   Fax:  [phone]  Occupational Therapy Treatment  Patient Details  Name: Jane Watkins MRN: [id number] Date of Birth: 1995-06-22 Referring Provider (OT): Dr. [name]   Encounter Date: 09/08/2019   OT End of Session - 09/08/19 1813    Visit Number 34    Number of Visits 72    Date for OT Re-Evaluation 12/02/19    Authorization Type Progress report period starting 08/01/2019    OT Start Time 1101    OT Stop Time 1145    OT Time Calculation (min) 44 min    Activity Tolerance Patient tolerated treatment well    Behavior During Therapy Flat affect           Past Medical History:  Diagnosis Date  . AKI (acute kidney injury) (HCC) 2018   "from overdose"  . Anxiety   . Chlamydia   . Daily headache   . Depression   . Drug overdose 04/2016   [name] 05/01/2016  . GERD (gastroesophageal reflux disease)    "when I was younger; gone now" (09/21/2017)  . IV drug abuse (HCC) 09/20/2017  . Migraine    "q couple weeks" (09/21/2017)  . Opioid abuse (HCC)   . Overdose   . Stroke [facility])     Past Surgical History:  Procedure Laterality Date  . APPENDECTOMY  04/2013  . I & D EXTREMITY Left 09/20/2017   Procedure: IRRIGATION AND DEBRIDEMENT LEFT ARM;  Surgeon: Bradly Bienenstock, MD;  Location: [facility] OR;  Service: Orthopedics;  Laterality: Left;  . TEE WITHOUT CARDIOVERSION N/A 02/10/2019   Procedure: TRANSESOPHAGEAL ECHOCARDIOGRAM (TEE);  Surgeon: Debbe Odea, MD;  Location: [facility] ORS;  Service: Cardiovascular;  Laterality: N/A;  Polysubstance abuser  . TONGUE SURGERY  ~ 2007   "related to lisp"  . TRACHEOSTOMY  04/2016   [name] 05/01/2016    There were no vitals filed for this visit.   Subjective Assessment - 09/08/19 1812    Subjective  Patient reports she didn't have time to make some calls to see if she can find a facility closer to home and will plan  to continue with therapy here at least one time a week.    Pertinent History Per chart. Pt. is a 24 y.o.female with a history of IV drug use, polysubstance as well as tobacco abuse, anxiety. Pt. presented to [facility] on 02/07/2019 after being found unresponsive.  EMS contacted patient received Narcan with some improvement in mental status.  Admission labs with WBC 20,700, potassium 5.2, BUN 25, creatinine 0.67, urine drug screen positive cocaine, marijuana and benzodiazepines, alcohol level less than 10, SARS coronavirus negative.  Cranial CT scan showed areas of indistinct low-density in the right basal ganglia with mass-effect on the right lateral ventricle.  Patient did not receive TPA.  MRI showed acute infarction of right MCA territory, affecting the basal ganglia and affecting the cortical and subcortical brain in a largely watershed distribution. Pt. had a previous inpatient rehab admission 05/01/2016 to 05/09/2016 for hypoxic encephalopathy after drug overdose requiring tracheostomy, and was later decannulated. Pt. resides with her boyfriend in an apartment. Pt. worked full time as a Production designer, theatre/television/film at [employer]. pt. enjoys travelling.    Patient Stated Goals Patient reports she would like to be as independent as possible with all her daily tasks.    Pain Onset More than a month ago  Patient reports she is Sore from botox injections yesterday and has difficulty with moving UE today.  Tender to touch.  Pain in left hand 4/10 with increased spasticity.   Patient reports she wants afternoon appts one time a week here while she looks around for other clinics near her home.   Increased tightness and spasticity in left arm today with pain at elbow with attempts for extension    Patient seen for PROM followed by AAROM to left dominant upper extremity with gentle prolonged stretching and use of tone inhibition techniques due to increased spasticity UBE with  2.0 resistance from seated position,  forwards/backwards for 5 mins with therapist in constant attendance to ensure grip and readjust hand on bike. Resistance altered from no resist to 2.0 and UE loosens.   Tabletop ROM with cloth under hand for forward reach and promotion of elbow extension with cues and occasional guiding from therapist.    Response to tx:   Patient received Botox injections to left UE yesterday and has increased spasticity and tender to the touch on the left UE today.  Responded to slow prolonged stretching along with tone inhibition techniques followed by active use as tolerated.  Difficulty with ROM this date with higher level of spasticity.  Patient is aware it takes several days to start to see the effect of Botox and she would benefit from continued therapy to work in conjunction to injections to help to manage tone and work towards normalizing tone to use left dominant UE in functional daily tasks.                       OT Education - 09/08/19 1813    Education Details elbow extension, HEP    Person(s) Educated Patient    Methods Explanation;Demonstration;Tactile cues;Verbal cues;Handout    Comprehension Verbalized understanding;Returned demonstration;Need further instruction               OT Long Term Goals - 09/06/19 1115      OT LONG TERM GOAL #1   Title Pt. will increase active isolated left shoulder flexion by 20 degrees in preparation for combing her hair.    Baseline 09/06/2019: shoulder flex 152.  06/09/2019: Shoulder flexion 152. 04/25/2019: Shoulder flexion 132. Pt. is unable to use her left hand to brush her hair. Eval: No isolated shoulder shoulder flexion. Pt. compensates with synergystic movement with attempts for flexion.    Time 12    Period Weeks    Status Achieved      OT LONG TERM GOAL #2   Title Pt. will increase active shoulder abduction by 20 degrees in preparation to be able to wash her hair.    Baseline 09/06/2019:  118 degrees able to wash her hair and rinse  it    Time 12    Period Weeks    Status Achieved      OT LONG TERM GOAL #3   Title Pt. will improve hand to face patterns using her left hand to be able to independently wash her face.    Baseline 05/30/2019: Pt. is able to wash her face with a wash rag using her left hand. 04/25/2019: Pt. is able to perfrom hand to face patterns with her left hand, however is not able to use for left hand to wash her face.Eval: Pt. is unable to wash her face using her left hand    Time 12    Period Weeks    Status Achieved  OT LONG TERM GOAL #4   Title Pt. will increase AROM wrist extension by 10 degrees in preparation for reaching for a cup    Baseline 05/30/2019: 54 degrees. Pt. is able to reach for a cup. 04/25/2019: Wrist extension 54. Pt. conitnues to work to reaching for a cup with her left hand. Eval: 0 degrees of active wrist extension.    Time 12    Period Weeks    Status Achieved      OT LONG TERM GOAL #5   Title Pt. will increase left digit flexion to be able to initiate actively holding a toothbrush.    Baseline 05/30/2019: Pt. is able to hold, and use a toothbrush. 04/25/2019: Pt. is indpendently able to hold, and stabilize a toorhbursh with her left hand while applying toothpaste. pt. is unable to use her left hand to brush her teeth. Eval: Pt. is unable to use her left hand to hold a toothbrush.    Time 12    Period Weeks    Status Achieved      Long Term Additional Goals   Additional Long Term Goals Yes      OT LONG TERM GOAL #6   Title Pt. will assume a mature tripod grasp on a pen while writing her name wiht 100% legibility.    Baseline 09/06/2019 still unable to form name with left 05/30/2019: Pt. is unable to assume a mature tripod grasp on a pen when drawing a line.    Time 12    Period Weeks    Status On-going    Target Date 12/02/19      OT LONG TERM GOAL #7   Title Pt. will independently type a 3 sentence email message efficiently with 100% accuracy    Baseline 05/30/2019:  Pt. is unable. 09/06/2019 still unable to type with left hand.    Time 12    Period Weeks    Status On-going    Target Date 12/02/19      OT LONG TERM GOAL #8   Title Pt. will improve left hand [facility] skills in order to independentlymanipulate small objects needed during ADLs, and IADLs,    Baseline 06/09/2019: Left hand [facility] skills are limited. 09/06/2019 left hand remains limited and increased spasticity.    Time 12    Period Weeks    Status On-going    Target Date 12/02/19      OT LONG TERM GOAL  #9   TITLE Pt. will use her left hand to independently handle, and use cooking, and baking utensils.    Baseline 05/30/2019: Pt. has difficulty.  09/06/2019 continued difficulty with tasks in the kitchen    Time 12    Period Weeks    Status On-going    Target Date 12/02/19      OT LONG TERM GOAL  #10   TITLE Patient will improve left hand function to complete buttons and zippers with modified independence.    Baseline difficulty with buttoning pants and shirts    Time 12    Period Weeks    Status New    Target Date 12/02/19      OT LONG TERM GOAL  #11   TITLE Pt will demonstrate ability to fold shirts with modified techniques.    Baseline unable to fold shirts    Time 12    Period Weeks    Status New    Target Date 12/02/19      OT LONG TERM GOAL  #12  TITLE Patient will complete sweeping with broom and manage a dustpan with min assist    Baseline unable to perform    Time 12    Status New    Target Date 12/02/19                 Plan - 09/08/19 1814    Clinical Impression Statement Patient received Botox injections to left UE yesterday and has increased spasticity and tender to the touch on the left UE today.  Responded to slow prolonged stretching along with tone inhibition techniques followed by active use as tolerated.  Difficulty with ROM this date with higher level of spasticity.  Patient is aware it takes several days to start to see the effect of Botox and she would  benefit from continued therapy to work in conjunction to injections to help to manage tone and work towards normalizing tone to use left dominant UE in functional daily tasks.    OT Occupational Profile and History Detailed Assessment- Review of Records and additional review of physical, cognitive, psychosocial history related to current functional performance    Occupational performance deficits (Please refer to evaluation for details): ADL's;IADL's    Body Structure / Function / Physical Skills ADL;IADL;Coordination;Endurance;UE functional use;Decreased knowledge of precautions;Dexterity;FMC;ROM;Tone;Strength    Rehab Potential Good    Clinical Decision Making Several treatment options, min-mod task modification necessary    Comorbidities Affecting Occupational Performance: May have comorbidities impacting occupational performance    Modification or Assistance to Complete Evaluation  Min-Moderate modification of tasks or assist with assess necessary to complete eval    OT Frequency 2x / week    OT Duration 12 weeks    OT Treatment/Interventions Self-care/ADL training;DME and/or AE instruction;Energy conservation;Therapeutic activities;Patient/family education;Therapeutic exercise;Neuromuscular education    Consulted and Agree with Plan of Care Patient           Patient will benefit from skilled therapeutic intervention in order to improve the following deficits and impairments:   Body Structure / Function / Physical Skills: ADL, IADL, Coordination, Endurance, UE functional use, Decreased knowledge of precautions, Dexterity, FMC, ROM, Tone, Strength       Visit Diagnosis: Muscle weakness (generalized)  Other lack of coordination  Right middle cerebral artery stroke [facility])    Problem List Patient Active Problem List   Diagnosis Date Noted  . 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  . Major depressive disorder, recurrent episode, severe (HCC) 06/21/2019  . Postcoital UTI 05/24/2019   . Spastic hemiparesis (HCC)   . Vascular headache   . Mood disorder in conditions classified elsewhere   . 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 Polysubstance dependence in early, early partial, sustained full, or sustained partial remission (HCC)   . Dyslipidemia   . Ischemic stroke diagnosed during current admission (HCC) 02/13/2019  . MDD (major depressive disorder), recurrent episode, moderate (HCC) 02/10/2019  . CVA (cerebral vascular accident) (HCC) 02/07/2019  . TBI (traumatic brain injury) (HCC) 05/01/2016  . 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 Elevated troponin    Kerrie Buffalo, OTR/L, CLT  Jane Watkins 09/09/2019, 3:37 PM  [place] [facility] MAIN [facility] SERVICES [address] [place], [place], [address] Phone: [phone]   Fax:  [phone]  Name: Jane Watkins MRN: [id number] Date of Birth: 1995-12-18

## 2019-09-08 NOTE — Addendum Note (Signed)
Addended by: Ezekiel Ina on: 09/08/2019 04:27 PM   Modules accepted: Orders

## 2019-09-13 ENCOUNTER — Ambulatory Visit: Payer: 59 | Admitting: Physical Therapy

## 2019-09-13 ENCOUNTER — Encounter: Payer: 59 | Admitting: Occupational Therapy

## 2019-09-14 ENCOUNTER — Other Ambulatory Visit: Payer: Self-pay | Admitting: Neurology

## 2019-09-15 ENCOUNTER — Ambulatory Visit: Payer: 59 | Admitting: Physical Therapy

## 2019-09-20 ENCOUNTER — Other Ambulatory Visit: Payer: Self-pay | Admitting: Physical Medicine and Rehabilitation

## 2019-09-20 ENCOUNTER — Encounter: Payer: 59 | Admitting: Occupational Therapy

## 2019-09-20 ENCOUNTER — Ambulatory Visit: Payer: 59 | Admitting: Physical Therapy

## 2019-09-20 MED ORDER — CLOPIDOGREL BISULFATE 75 MG PO TABS: 75.0000 mg | ORAL_TABLET | Freq: Every day | ORAL | 0 refills | Status: DC

## 2019-09-22 ENCOUNTER — Other Ambulatory Visit: Payer: Self-pay | Admitting: Physical Medicine and Rehabilitation

## 2019-09-22 ENCOUNTER — Encounter: Payer: 59 | Admitting: Occupational Therapy

## 2019-09-27 ENCOUNTER — Ambulatory Visit: Payer: 59 | Admitting: Physical Therapy

## 2019-09-27 ENCOUNTER — Encounter: Payer: 59 | Admitting: Occupational Therapy

## 2019-09-29 ENCOUNTER — Encounter: Payer: 59 | Admitting: Occupational Therapy

## 2019-09-29 ENCOUNTER — Ambulatory Visit: Payer: 59 | Admitting: Physical Therapy

## 2019-10-04 ENCOUNTER — Encounter: Payer: 59 | Admitting: Occupational Therapy

## 2019-10-04 ENCOUNTER — Ambulatory Visit: Payer: 59 | Admitting: Physical Therapy

## 2019-10-05 ENCOUNTER — Encounter: Payer: 59 | Attending: Physical Medicine and Rehabilitation | Admitting: Physical Medicine and Rehabilitation

## 2019-10-05 DIAGNOSIS — I69354 Hemiplegia and hemiparesis following cerebral infarction affecting left non-dominant side: Secondary | ICD-10-CM | POA: Insufficient documentation

## 2019-10-05 DIAGNOSIS — I69398 Other sequelae of cerebral infarction: Secondary | ICD-10-CM | POA: Insufficient documentation

## 2019-10-05 DIAGNOSIS — M25512 Pain in left shoulder: Secondary | ICD-10-CM | POA: Insufficient documentation

## 2019-10-05 DIAGNOSIS — I69314 Frontal lobe and executive function deficit following cerebral infarction: Secondary | ICD-10-CM | POA: Insufficient documentation

## 2019-10-05 DIAGNOSIS — R2689 Other abnormalities of gait and mobility: Secondary | ICD-10-CM | POA: Insufficient documentation

## 2019-10-05 DIAGNOSIS — R279 Unspecified lack of coordination: Secondary | ICD-10-CM | POA: Insufficient documentation

## 2019-10-05 DIAGNOSIS — F1911 Other psychoactive substance abuse, in remission: Secondary | ICD-10-CM | POA: Insufficient documentation

## 2019-10-05 DIAGNOSIS — G44221 Chronic tension-type headache, intractable: Secondary | ICD-10-CM | POA: Insufficient documentation

## 2019-10-05 DIAGNOSIS — Z930 Tracheostomy status: Secondary | ICD-10-CM | POA: Insufficient documentation

## 2019-10-05 DIAGNOSIS — F411 Generalized anxiety disorder: Secondary | ICD-10-CM | POA: Insufficient documentation

## 2019-10-05 DIAGNOSIS — F332 Major depressive disorder, recurrent severe without psychotic features: Secondary | ICD-10-CM | POA: Insufficient documentation

## 2019-10-06 ENCOUNTER — Ambulatory Visit: Payer: 59 | Admitting: Physical Therapy

## 2019-10-11 ENCOUNTER — Other Ambulatory Visit: Payer: Self-pay | Admitting: Neurology

## 2019-10-11 ENCOUNTER — Encounter: Payer: 59 | Admitting: Occupational Therapy

## 2019-10-11 ENCOUNTER — Ambulatory Visit: Payer: 59 | Admitting: Physical Therapy

## 2019-10-12 ENCOUNTER — Other Ambulatory Visit: Payer: Self-pay | Admitting: Physical Medicine and Rehabilitation

## 2019-10-12 MED ORDER — TIZANIDINE HCL 4 MG PO TABS: 4.0000 mg | ORAL_TABLET | Freq: Three times a day (TID) | ORAL | 1 refills | Status: DC

## 2019-10-12 MED ORDER — BACLOFEN 10 MG PO TABS
10.0000 mg | ORAL_TABLET | Freq: Three times a day (TID) | ORAL | 1 refills | Status: DC
Start: 2019-10-12 — End: 2019-12-13

## 2019-10-13 ENCOUNTER — Encounter: Payer: 59 | Admitting: Occupational Therapy

## 2019-10-13 ENCOUNTER — Ambulatory Visit: Payer: 59 | Admitting: Physical Therapy

## 2019-10-19 ENCOUNTER — Other Ambulatory Visit: Payer: Self-pay | Admitting: Physical Medicine and Rehabilitation

## 2019-10-21 ENCOUNTER — Other Ambulatory Visit: Payer: Self-pay | Admitting: Physical Medicine and Rehabilitation

## 2019-10-21 MED ORDER — BUSPIRONE HCL 10 MG PO TABS: 10.0000 mg | ORAL_TABLET | Freq: Three times a day (TID) | ORAL | 3 refills | Status: DC

## 2019-10-26 ENCOUNTER — Encounter: Payer: 59 | Attending: Physical Medicine and Rehabilitation | Admitting: Physical Medicine and Rehabilitation

## 2019-10-26 ENCOUNTER — Encounter: Payer: Self-pay | Admitting: Physical Medicine and Rehabilitation

## 2019-10-26 ENCOUNTER — Other Ambulatory Visit: Payer: Self-pay

## 2019-10-26 VITALS — BP 107/68 | HR 80 | Temp 98.7°F | Wt 111.0 lb

## 2019-10-26 DIAGNOSIS — F1911 Other psychoactive substance abuse, in remission: Secondary | ICD-10-CM | POA: Insufficient documentation

## 2019-10-26 DIAGNOSIS — R2689 Other abnormalities of gait and mobility: Secondary | ICD-10-CM | POA: Diagnosis not present

## 2019-10-26 DIAGNOSIS — R279 Unspecified lack of coordination: Secondary | ICD-10-CM | POA: Diagnosis not present

## 2019-10-26 DIAGNOSIS — G8110 Spastic hemiplegia affecting unspecified side: Secondary | ICD-10-CM | POA: Diagnosis not present

## 2019-10-26 DIAGNOSIS — M25512 Pain in left shoulder: Secondary | ICD-10-CM | POA: Diagnosis not present

## 2019-10-26 DIAGNOSIS — I69354 Hemiplegia and hemiparesis following cerebral infarction affecting left non-dominant side: Secondary | ICD-10-CM | POA: Diagnosis present

## 2019-10-26 DIAGNOSIS — F332 Major depressive disorder, recurrent severe without psychotic features: Secondary | ICD-10-CM | POA: Insufficient documentation

## 2019-10-26 DIAGNOSIS — I69314 Frontal lobe and executive function deficit following cerebral infarction: Secondary | ICD-10-CM | POA: Insufficient documentation

## 2019-10-26 DIAGNOSIS — G44221 Chronic tension-type headache, intractable: Secondary | ICD-10-CM | POA: Insufficient documentation

## 2019-10-26 DIAGNOSIS — Z930 Tracheostomy status: Secondary | ICD-10-CM | POA: Diagnosis not present

## 2019-10-26 DIAGNOSIS — I69398 Other sequelae of cerebral infarction: Secondary | ICD-10-CM | POA: Diagnosis not present

## 2019-10-26 DIAGNOSIS — F411 Generalized anxiety disorder: Secondary | ICD-10-CM | POA: Diagnosis not present

## 2019-10-26 MED ORDER — DESVENLAFAXINE SUCCINATE ER 50 MG PO TB24: 50.0000 mg | ORAL_TABLET | Freq: Every day | ORAL | 3 refills | Status: DC

## 2019-10-26 NOTE — Progress Notes (Signed)
Subjective:    Patient ID: Jane Watkins, female    DOB: 03/25/1995, 24 y.o.   MRN: [id number]  HPI ] Jane Watkins returns for follow-up of her spastic hemiparesis and depression.  She finds that the Prozac is not helping enough with her depression. Her friend has similar symptoms to her and has benefit with Pristiq and she would like to try this medication. Discussed side effect profile with her.   She uses the left hand to drive. She tries to use it whenever she gets a chance to continue to strengthen it.  She would like to repeat Botox injections with a higher dose for her upper extremities. Last visit she received 200U altogether and would like to increase to 300U. She has no adverse effects from the Botox.   I have previously provided her refill of Tizanidine and Plavix.    Pain Inventory Average Pain 3 Pain Right Now 1 My pain is intermittent and aching  In the last 24 hours, has pain interfered with the following? General activity 4 Relation with others 3 Enjoyment of life 5 What TIME of day is your pain at its worst? night Sleep (in general) Fair  Pain is worse with: walking and some activites Pain improves with: rest, heat/ice, medication and injections Relief from Meds: 2  Mobility walk without assistance  Function disabled: date disabled . I need assistance with the following:  dressing, bathing and meal prep  Neuro/Psych depression anxiety  Prior Studies Any changes since last visit?  no  Physicians involved in your care Any changes since last visit?  no   Family History  Problem Relation Age of Onset  . Anemia Father   . Healthy Sister   . Thyroid cancer Maternal Grandmother   . Lung cancer Maternal Grandfather        metastasized  . Lung cancer Paternal Grandfather    Social History   Socioeconomic History  . Marital status: Significant Other    Spouse name: Not on file  . Number of children: Not on file  . Years of education: high  school  . Highest education level: Not on file  Occupational History  . Not on file  Tobacco Use  . Smoking status: Current Every Day Smoker    Packs/day: 0.50    Years: 10.00    Pack years: 5.00    Types: Cigarettes  . Smokeless tobacco: Never Used  Vaping Use  . Vaping Use: Never used  Substance and Sexual Activity  . Alcohol use: No  . Drug use: Yes    Types: IV, Heroin, Cocaine, Marijuana    Comment: last use of heroin and cocaine was 01/2019, marijuana daily  . Sexual activity: Yes    Birth control/protection: I.U.D.    Comment: Paragard  Other Topics Concern  . Not on file  Social History Narrative   05/24/19   From: [name], [place]   Living: with boyfriend [name]) -- former husband died from overdose   Work: not currently, in the application process for disability      Family: good relationship with dad who lives in [place], mom is in [place]. [place] relationship with bother and sister      Enjoys: training her puppy (corndog)      Exercise: walking, exercise bike in the apartment   Diet: not the best, tries to cook at home, fruit/veggies      Safety   Seat belts: Yes    Guns: No   Safe in relationships: Yes  Social Determinants of Health   Financial Resource Strain:   . Difficulty of Paying Living Expenses: Not on file  Food Insecurity:   . Worried About Programme researcher, broadcasting/film/video in the Last Year: Not on file  . Ran Out of Food in the Last Year: Not on file  Transportation Needs:   . Lack of Transportation (Medical): Not on file  . Lack of Transportation (Non-Medical): Not on file  Physical Activity:   . Days of Exercise per Week: Not on file  . Minutes of Exercise per Session: Not on file  Stress:   . Feeling of Stress : Not on file  Social Connections:   . Frequency of Communication with Friends and Family: Not on file  . Frequency of Social Gatherings with Friends and Family: Not on file  . Attends Religious Services: Not on file  . Active Member of Clubs or  Organizations: Not on file  . Attends Banker Meetings: Not on file  . Marital Status: Not on file   Past Surgical History:  Procedure Laterality Date  . APPENDECTOMY  04/2013  . I & D EXTREMITY Left 09/20/2017   Procedure: IRRIGATION AND DEBRIDEMENT LEFT ARM;  Surgeon: Bradly Bienenstock, MD;  Location: [facility] OR;  Service: Orthopedics;  Laterality: Left;  . TEE WITHOUT CARDIOVERSION N/A 02/10/2019   Procedure: TRANSESOPHAGEAL ECHOCARDIOGRAM (TEE);  Surgeon: Debbe Odea, MD;  Location: [facility] ORS;  Service: Cardiovascular;  Laterality: N/A;  Polysubstance abuser  . TONGUE SURGERY  ~ 2007   "related to lisp"  . TRACHEOSTOMY  04/2016   [name] 05/01/2016   Past Medical History:  Diagnosis Date  . AKI (acute kidney injury) (HCC) 2018   "from overdose"  . Anxiety   . Chlamydia   . Daily headache   . Depression   . Drug overdose 04/2016   [name] 05/01/2016  . GERD (gastroesophageal reflux disease)    "when I was younger; gone now" (09/21/2017)  . IV drug abuse (HCC) 09/20/2017  . Migraine    "q couple weeks" (09/21/2017)  . Opioid abuse (HCC)   . Overdose   . Stroke [facility])    There were no vitals taken for this visit.  Opioid Risk Score:   Fall Risk Score:  `1  Depression screen PHQ 2/9  Depression screen [facility] 2/9 07/05/2019 05/24/2019  Decreased Interest 3 3  Down, Depressed, Hopeless - 3  PHQ - 2 Score 3 6  Altered sleeping - 3  Tired, decreased energy - 3  Change in appetite - 2  Feeling bad or failure about yourself  - 2  Trouble concentrating - 2  Moving slowly or fidgety/restless - 0  Suicidal thoughts - 3  PHQ-9 Score - 21  Difficult doing work/chores - Somewhat difficult  Some recent data might be hidden   Review of Systems  Constitutional: Negative.   HENT: Negative.   Eyes: Negative.   Respiratory: Negative.   Cardiovascular: Negative.   Gastrointestinal: Negative.   Endocrine: Negative.   Genitourinary: Negative.   Musculoskeletal: Negative.    Skin: Negative.   Allergic/Immunologic: Negative.   Neurological: Negative.   Hematological: Bruises/bleeds easily.       Plavix  Psychiatric/Behavioral: Positive for dysphoric mood. The patient is nervous/anxious.   All other systems reviewed and are negative.      Objective:   Physical Exam Gen: no distress, normal appearing HEENT: oral mucosa pink and moist, NCAT Cardio: Reg rate Chest: normal effort, normal rate of breathing Abd: soft, non-distended  Ext: no edema Skin: intact Psych: Pleasant.  Musc: No edema in extremities.  No tenderness in extremities. Neurological: Alert Motor:  LUE: 4/5 throughout.  LLE: HF, KE 5/5, 1/5 ADF  MAS: Right side 1+ in PF, 1+ in toe flexors 3rd through 5th digits, 1 in finger flexors and 1 in elbow flexors.     Assessment & Plan:  1.  Left side hemiparesis, now with spasticity, secondary to right MCA infarction as well as history of hypoxic encephalopathy after drug overdose 2018 requiring tracheostomy and received inpatient rehab services             Continue LLE PRAFO             Continue HEP.             Mobility in LUE is much improved!             She has been able to walk 20-30 min daily.   Repeat Botox next visit with 300U- increase dose for upper extremities.   Previously provided refill of Tizanidine and Baclofen. Advised no more than three times per day for each. Should repeat LFTs next visit.   3. Insomnia            Continue Seroquel150mg  given continued difficulty sleeping at night.    4. Depression:              Switch from Prozac to Pristiq. Discussed potential side effects in detail, especially increased risk of bleeding. Stop and call me if she experienced abdominal pain suggestive of GI bleed. She has my cell phone number.    5.  Polysubstance abuse as well as tobacco abuse.  No longer requiring Chantix or Nicotine patches.    6.  Hyperlipidemia.  Continue Lipitor   7. Anxiety: Continue Buspar 5mg  BID.    8.  Completed UTI treatment.     9. Spasticity of left flexor pollicis longus, left soleus and gastrocnemius, left toe flexors. RTC in 2 months for 300U Botox as follows:  Left soleus: 75U in 3 sites Left gastrocnemius: 75U in 3 sites Left flexor pollicis longus: 20U Left adductor pollicis: 20U.  Left Flexor digitorus profundus: 50U Left flexor digitorum superficialis: 50U Flexor carpi radialis: 20U   10. Dental work: Patient should be fine receiving Lidocaine numbing medication for dental work. Reviewed her Echo which was within normal limits.   11. Has safely returned to driving.   12. Contraception: Has appointment tomorrow for IUD placement. Advised that oral contraceptives are contraindicated given increased risk for stroke.   All questions answered.

## 2019-10-27 ENCOUNTER — Encounter: Payer: Self-pay | Admitting: Physical Medicine and Rehabilitation

## 2019-10-28 ENCOUNTER — Other Ambulatory Visit: Payer: Self-pay | Admitting: *Deleted

## 2019-10-28 ENCOUNTER — Other Ambulatory Visit: Payer: Self-pay | Admitting: Physical Medicine and Rehabilitation

## 2019-11-02 ENCOUNTER — Other Ambulatory Visit: Payer: Self-pay | Admitting: Physical Medicine and Rehabilitation

## 2019-11-02 ENCOUNTER — Encounter: Payer: Self-pay | Admitting: Family Medicine

## 2019-11-02 DIAGNOSIS — L237 Allergic contact dermatitis due to plants, except food: Secondary | ICD-10-CM

## 2019-11-02 MED ORDER — HYDROCORTISONE 2 % EX LOTN: 1.0000 "application " | TOPICAL_LOTION | Freq: Two times a day (BID) | CUTANEOUS | 0 refills | Status: DC

## 2019-11-02 NOTE — Telephone Encounter (Signed)
Pt is requesting prescription for poison ivy.  Pharmacy [facility] [address] in [place].  Pt requests c/b [phone]  Thank you!

## 2019-11-03 ENCOUNTER — Telehealth: Payer: Self-pay

## 2019-11-03 MED ORDER — QUETIAPINE FUMARATE 50 MG PO TABS: 50.0000 mg | ORAL_TABLET | Freq: Every day | ORAL | 1 refills | Status: DC

## 2019-11-03 NOTE — Telephone Encounter (Signed)
Refill request sent

## 2019-11-04 ENCOUNTER — Other Ambulatory Visit: Payer: Self-pay | Admitting: Physical Medicine and Rehabilitation

## 2019-11-04 MED ORDER — HYDROCORTISONE 2 % EX LOTN
1.0000 | TOPICAL_LOTION | Freq: Two times a day (BID) | CUTANEOUS | 0 refills | Status: DC
Start: 2019-11-04 — End: 2019-11-04

## 2019-11-04 MED ORDER — PREDNISONE 10 MG PO TABS: ORAL_TABLET | ORAL | 0 refills | Status: AC

## 2019-11-04 NOTE — Addendum Note (Signed)
Addended by: Gweneth Dimitri R on: 11/04/2019 12:20 PM   Modules accepted: Orders

## 2019-11-11 ENCOUNTER — Other Ambulatory Visit: Payer: Self-pay | Admitting: *Deleted

## 2019-11-11 MED ORDER — CLOPIDOGREL BISULFATE 75 MG PO TABS
75.0000 mg | ORAL_TABLET | Freq: Every day | ORAL | 0 refills | Status: DC
Start: 2019-11-11 — End: 2019-12-05

## 2019-11-16 ENCOUNTER — Other Ambulatory Visit: Payer: Self-pay | Admitting: Physical Medicine and Rehabilitation

## 2019-11-18 ENCOUNTER — Other Ambulatory Visit: Payer: Self-pay | Admitting: Physical Medicine and Rehabilitation

## 2019-11-26 ENCOUNTER — Other Ambulatory Visit: Payer: Self-pay | Admitting: Physical Medicine and Rehabilitation

## 2019-11-27 ENCOUNTER — Other Ambulatory Visit: Payer: Self-pay | Admitting: Adult Health

## 2019-11-29 ENCOUNTER — Telehealth: Payer: Self-pay | Admitting: Adult Health

## 2019-11-29 NOTE — Telephone Encounter (Signed)
Records faxed to Disability Determination Services at [phone] on 11/29/2019.

## 2019-12-01 ENCOUNTER — Ambulatory Visit: Payer: 59 | Admitting: Adult Health

## 2019-12-01 ENCOUNTER — Encounter: Payer: Self-pay | Admitting: Adult Health

## 2019-12-05 ENCOUNTER — Ambulatory Visit (INDEPENDENT_AMBULATORY_CARE_PROVIDER_SITE_OTHER): Payer: 59 | Admitting: Adult Health

## 2019-12-05 ENCOUNTER — Encounter: Payer: Self-pay | Admitting: Adult Health

## 2019-12-05 VITALS — BP 113/66 | HR 88 | Ht 62.0 in | Wt 119.0 lb

## 2019-12-05 DIAGNOSIS — Z720 Tobacco use: Secondary | ICD-10-CM

## 2019-12-05 DIAGNOSIS — G8114 Spastic hemiplegia affecting left nondominant side: Secondary | ICD-10-CM

## 2019-12-05 DIAGNOSIS — F1911 Other psychoactive substance abuse, in remission: Secondary | ICD-10-CM

## 2019-12-05 DIAGNOSIS — I63511 Cerebral infarction due to unspecified occlusion or stenosis of right middle cerebral artery: Secondary | ICD-10-CM

## 2019-12-05 MED ORDER — ASPIRIN EC 81 MG PO TBEC: 81.0000 mg | DELAYED_RELEASE_TABLET | Freq: Every day | ORAL | 11 refills | Status: DC

## 2019-12-05 NOTE — Patient Instructions (Signed)
Recommend starting aspirin 81 mg daily and stop plavix  continue atorvastatin  for secondary stroke prevention  Continue to follow with PMR for ongoing botox injections and discuss additional therapy options closer to [facility] for hopeful continued recovery   Continue to follow up with PCP regarding cholesterol and blood pressure management  Maintain strict control of hypertension with blood pressure goal below 130/90 and cholesterol with LDL cholesterol (bad cholesterol) goal below 70 mg/dL.     Followup in the future with me in 6 months or call earlier if needed       Thank you for coming to see [place] at [facility]. I hope we have been able to provide you high quality care today.  You may receive a patient satisfaction survey over the next few weeks. We would appreciate your feedback and comments so that we may continue to improve ourselves and the health of our patients.

## 2019-12-05 NOTE — Progress Notes (Signed)
[facility] [address] [place]. [place] [address] [phone]       OFFICE FOLLOW UP NOTE  Ms. Laqueta Carina Date of Birth:  [birth year] Medical Record Number:  [id number]   Referring MD: ZackSchwartz  Reason for Referral: Stroke  Chief complaint: Chief Complaint  Patient presents with  . Follow-up    tx rm  . Cerebrovascular Accident    pt is having no new sx.     HPI:  Today, 12/05/2019, Jane Watkins returns for stroke follow-up unaccompanied.  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  Denies new or worsening stroke/TIA symptoms.  Continues to receive Botox injections by PMR as well as baclofen and tizanidine with benefit of poststroke spasticity.  Remains on topiramate 100 mg twice daily for migraine prophylaxis with continued benefit and tolerating without side effects.  She is currently in the process of applying for permanent disability.  Completed therapies at [facility] due to transportation issues but is interested in pursuing additional therapies closer to her home in [place], [place].  Remains on Plavix but does report increased bruising and constant cold sensation.  Denies bleeding.  Requests possibly switching to aspirin 81 mg daily.  She has not previously on antithrombotic prior to her stroke.  Blood pressure today 113/66.  Continued tobacco use but has been slowly decreasing daily amount.  Denies any additional substance abuse.   History provided for reference purposes only Update 08/03/2019 JM: [name] returns for stroke follow-up accompanied by her father.  Residual deficits of left spastic hemiparesis, imbalance and gait impairment and reports having increased tone in her left ankle/foot and left arm.  Reports imbalance possibly worsening but not sure if due to increased tone or so she has increased activity.  She does report frequent falls usually from legs giving out, tripping or losing balance.   She continues to work with outpatient PT/OT at [facility] with review of therapy notes who reports ongoing improvement.  Left-sided spasticity has been limiting functional use and overall rehab.  Continues to follow with PMR Dr. Carlis Abbott for management of spasticity with tizanidine and baclofen as well as recently starting Botox.  Denies new stroke/TIA symptoms.  She continues on Plavix and atorvastatin 20 mg daily for secondary stroke prevention. She does report frequent/easy bruising but no bleeding. Blood pressure today 106/69.  Reports migraine have been stable with ongoing use of topiramate 100 mg twice daily which was previously increased by Dr. Pearlean Brownie.  TCD bubble 05/03/2019 negative for PFO.  No further concerns at this time.  Consult visit 04/19/2019 Dr. 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  Patient was found unresponsive on 02/07/2019 after neighbors reported that the patient and her boyfriend had been arguing loudly that day.  Patient was found down at home by her boyfriend EMS were called and gave Narcan with some improvement in mental status.  Last known well was unclear.  NIH stroke scale initially was 14.  MRI scan of the brain showed a moderate sized patchy right middle cerebral artery infarct involving cortex as well as subcortical region CTA of the head and neck showed no significant large vessel intracranial or extracranial stenosis.  2D echo showed normal ejection fraction.  Transesophageal echo was also performed and showed no cardiac source of embolism or PFO.  LDL cholesterol was 35  mg percent hemoglobin A1c was 5.0.  Urine drug screen was positive for marijuana and cocaine.  Patient was admitted to [facility] and transferred to inpatient rehab.  She is done well and gradually  improved.  She is living at home with her boyfriend.  She is getting outpatient physical occupational therapy and is able to ambulate independently with just a left foot brace and without even a cane or a walker.  She still has significant weakness in the left grip and intrinsic hand muscles.  She needs help with the boyfriend to cook as well as shower and dress herself to some degree.  She still continues to smoke cigarettes and marijuana but states she is giving up cocaine.  She does have history of migraine headaches and reports them occurring every other day.  She takes Topamax 75 mg twice daily which helps him prophylaxis.  She takes Tylenol Extra Strength for symptomatic relief.  She has no prior history of deep and thrombosis, pulmonary embolism, stroke in a young age in the family.  She does not mention she has some mild cognitive impairment and short-term memory difficulties since initial hypoxic event from overdose in 2018 in which may have slightly worsened after the current stroke.  She is to work in [employer] as a Production designer, theatre/television/film but is presently unemployed and plans to apply for disability.  She is presently on Plavix which is tolerating well without bruising or bleeding and Lipitor 80 mg daily.     ROS:   14 system review of systems is positive for those listed in HPI and all other systems negative  PMH:  Past Medical History:  Diagnosis Date  . AKI (acute kidney injury) (HCC) 2018   "from overdose"  . Anxiety   . Chlamydia   . Daily headache   . Depression   . Drug overdose 04/2016   [name] 05/01/2016  . GERD (gastroesophageal reflux disease)    "when I was younger; gone now" (09/21/2017)  . IV drug abuse (HCC) 09/20/2017  . Migraine    "q couple weeks" (09/21/2017)  . Opioid abuse (HCC)   . Overdose   . Stroke [facility])     Social History:  Social History   Socioeconomic History  . Marital status: Significant Other    Spouse name: Not on file  . Number of children: Not on file  .  Years of education: high school  . Highest education level: Not on file  Occupational History  . Not on file  Tobacco Use  . Smoking status: Current Every Day Smoker    Packs/day: 0.50    Years: 10.00    Pack years: 5.00    Types: Cigarettes  . Smokeless tobacco: Never Used  Vaping Use  . Vaping Use: Never used  Substance and Sexual Activity  . Alcohol use: No  . Drug use: Yes    Types: IV, Heroin, Cocaine, Marijuana    Comment: last use of heroin and cocaine was 01/2019, marijuana daily  . Sexual activity: Yes    Birth control/protection: I.U.D.    Comment: Paragard  Other Topics Concern  . Not on file  Social History Narrative   05/24/19   From: [name], [place]   Living: with boyfriend [name]) -- former husband died from overdose   Work: not currently, in the application process for disability      Family: good relationship with dad who lives in [place], mom is in [place]. [place] relationship with bother and sister  Enjoys: training her puppy (corndog)      Exercise: walking, exercise bike in the apartment   Diet: not the best, tries to cook at home, fruit/veggies      Safety   Seat belts: Yes    Guns: No   Safe in relationships: Yes    Social Determinants of Health   Financial Resource Strain:   . Difficulty of Paying Living Expenses: Not on file  Food Insecurity:   . Worried About Programme researcher, broadcasting/film/videounning Out of Food in the Last Year: Not on file  . Ran Out of Food in the Last Year: Not on file  Transportation Needs:   . Lack of Transportation (Medical): Not on file  . Lack of Transportation (Non-Medical): Not on file  Physical Activity:   . Days of Exercise per Week: Not on file  . Minutes of Exercise per Session: Not on file  Stress:   . Feeling of Stress : Not on file  Social Connections:   . Frequency of Communication with Friends and Family: Not on file  . Frequency of Social Gatherings with Friends and Family: Not on file  . Attends Religious Services: Not on file  .  Active Member of Clubs or Organizations: Not on file  . Attends BankerClub or Organization Meetings: Not on file  . Marital Status: Not on file  Intimate Partner Violence:   . Fear of Current or Ex-Partner: Not on file  . Emotionally Abused: Not on file  . Physically Abused: Not on file  . Sexually Abused: Not on file    Medications:   Current Outpatient Medications on File Prior to Visit  Medication Sig Dispense Refill  . acetaminophen (TYLENOL) 325 MG tablet Take 2 tablets (650 mg total) by mouth every 4 (four) hours as needed for mild pain (or temp > 37.5 C (99.5 F)).    Marland Kitchen. atorvastatin (LIPITOR) 20 MG tablet TAKE 1 TABLET (20 MG TOTAL) BY MOUTH DAILY AT 6 PM. 30 tablet 5  . baclofen (LIORESAL) 10 MG tablet Take 1 tablet (10 mg total) by mouth 3 (three) times daily. 90 tablet 1  . benzoyl peroxide 5 % external liquid Apply topically 2 (two) times daily.    . busPIRone (BUSPAR) 10 MG tablet TAKE 1 TABLET BY MOUTH THREE TIMES A DAY (Patient taking differently: Takes as needed) 270 tablet 2  . clindamycin-benzoyl peroxide (BENZACLIN) gel As directed    . clopidogrel (PLAVIX) 75 MG tablet Take 1 tablet (75 mg total) by mouth daily. 30 tablet 0  . doxycycline (VIBRA-TABS) 100 MG tablet Take 100 mg by mouth 2 (two) times daily.    . pantoprazole (PROTONIX) 40 MG tablet Take 1 tablet (40 mg total) by mouth daily. 30 tablet 0  . QUEtiapine (SEROQUEL) 50 MG tablet Take 1 tablet (50 mg total) by mouth at bedtime. 90 tablet 1  . tiZANidine (ZANAFLEX) 4 MG tablet Take 1 tablet (4 mg total) by mouth 3 (three) times daily. 90 tablet 1  . topiramate (TOPAMAX) 50 MG tablet TAKE 2 TABLETS (100 MG TOTAL) BY MOUTH 2 (TWO) TIMES DAILY. 120 tablet 2  . tretinoin (RETIN-A) 0.05 % cream As directed     Current Facility-Administered Medications on File Prior to Visit  Medication Dose Route Frequency Provider Last Rate Last Admin  . paragard intrauterine copper IUD   Intrauterine Once Copland, Alicia B, PA-C         Allergies:   Allergies  Allergen Reactions  . Other Rash  Tide detergent   . Sulfa Antibiotics Rash    Physical Exam  Today's Vitals   12/05/19 1323  BP: 113/66  Pulse: 88  Weight: 119 lb (54 kg)  Height: 5\' 2"  (1.575 m)   Body mass index is 21.77 kg/m.  General: Petite pleasant young Caucasian lady, seated, in no evident distress Head: head normocephalic and atraumatic.   Neck: supple with no carotid or supraclavicular bruits Cardiovascular: regular rate and rhythm, no murmurs Musculoskeletal: no deformity Skin:  no rash/petichiae Vascular:  Normal pulses all extremities  Neurologic Exam Mental Status: Awake and fully alert. Fluent speech and language. Oriented to place and time. Recent and remote memory intact. Attention span, concentration and fund of knowledge appropriate. Mood and affect appropriate. Pleasant and cooperative.  Cranial Nerves: Pupils equal, briskly reactive to light. Extraocular movements full without nystagmus. Visual fields full to confrontation. Hearing intact. Facial sensation intact.  Moderate left lower facial weakness, tongue, and palate moves normally and symmetrically.  Motor: 4/5 Spastic left hemiparesis with greater weakness hand grip and ankle dorsiflexion weakness; full strength right upper and lower extremity Sensory.: intact to touch , pinprick , position and vibratory sensation.  Coordination: Decreased rapid movements on the left side. Finger-to-nose and heel-to-shin performed accurately on right side Gait and Station: Arises from chair without difficulty.  Walks with hemiplegic gait with circumduction and pointed left foot with increased tone.  No use of assistive device or AFO brace. Reflexes: 2+ and asymmetric and brisker on the left arm>leg. Toes downgoing.       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  Vascular risk factors  of smoking, marijuana and cocaine abuse only.     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 -Residual left spastic hemiparesis, imbalance  -Continued follow-up with PMR for spasticity management including Botox injections, baclofen and tizanidine  -Advised to discuss initiating additional therapy after follow-up visit with PMR this week -Complains of excessive bruising and coldness on Plavix.  Recommend switching to aspirin 81 mg daily as she was not previously on antithrombotic PTA.  Continue atorvastatin 20 mg daily for secondary stroke prevention -Continue to follow with PCP for HLD management and prescribing of atorvastatin -Discussed importance of complete tobacco cessation which she is currently working on.    Migraines -Stable -Continue Topamax 100 mg twice daily    Follow-up in 6 months or call earlier if needed   CC:  [facility] provider: Dr. January 2021, Chiquita Loth, MD    I spent 30 minutes of face-to-face and non-face-to-face time with patient.  This included previsit chart review, lab review, study review, order entry, electronic health record documentation, patient education and discussion regarding prior stroke, residual deficits, importance of managing stroke risk factors, history of migraines and ongoing use of Topamax and answered all other questions to patient satisfaction   Chryl Heck, AGNP-BC  [facility] [address] [place], [place]  Phone [phone] Fax [phone] Note: This document was prepared with digital dictation and possible smart phrase technology. Any transcriptional errors that result from this process are unintentional.

## 2019-12-07 NOTE — Progress Notes (Signed)
I agree with the above plan 

## 2019-12-09 ENCOUNTER — Encounter: Payer: Self-pay | Admitting: Physical Medicine and Rehabilitation

## 2019-12-09 ENCOUNTER — Other Ambulatory Visit: Payer: Self-pay

## 2019-12-09 ENCOUNTER — Encounter: Payer: 59 | Attending: Physical Medicine and Rehabilitation | Admitting: Physical Medicine and Rehabilitation

## 2019-12-09 VITALS — BP 114/76 | HR 91 | Temp 98.6°F | Ht 62.0 in | Wt 120.2 lb

## 2019-12-09 DIAGNOSIS — G8110 Spastic hemiplegia affecting unspecified side: Secondary | ICD-10-CM

## 2019-12-09 MED ORDER — BUPROPION HCL ER (XL) 150 MG PO TB24: 150.0000 mg | ORAL_TABLET | Freq: Every day | ORAL | 0 refills | Status: DC

## 2019-12-09 MED ORDER — BUPROPION HCL ER (XL) 150 MG PO TB24: 150.0000 mg | ORAL_TABLET | Freq: Every day | ORAL | 1 refills | Status: DC

## 2019-12-09 NOTE — Progress Notes (Signed)
Botox Injection for spasticity using US guidance  Indication: Severe spasticity which interferes with ADL,mobility and/or  hygiene and is unresponsive to medication management and other conservative care Informed consent was obtained after describing risks and benefits of the procedure with the patient. This includes bleeding, bruising, infection, excessive weakness, or medication side effects. A REMS form is on file and signed. 200 U were injected as follows: Left soleus: 60 U divided into 3 sites (20 each) Left gastrocnemius: 75 U divided into 3 sites (25 each) in both medial and lateral heads (150 altogether)  Left flexor pollicis longus: 20U Left adductor pollicis: 20U.  Left flexor digitorum profundus: 50U  All injections were done after obtaining appropriate US guidance and after negative drawback for blood. The patient tolerated the procedure well. Post procedure instructions were given. A followup appointment was made.

## 2019-12-12 ENCOUNTER — Other Ambulatory Visit: Payer: Self-pay | Admitting: Physical Medicine and Rehabilitation

## 2019-12-13 ENCOUNTER — Other Ambulatory Visit: Payer: Self-pay | Admitting: Physical Medicine and Rehabilitation

## 2019-12-20 NOTE — Telephone Encounter (Signed)
Can this note be signed? 

## 2019-12-22 ENCOUNTER — Other Ambulatory Visit: Payer: Self-pay | Admitting: Physical Medicine and Rehabilitation

## 2019-12-22 ENCOUNTER — Other Ambulatory Visit: Payer: Self-pay | Admitting: *Deleted

## 2019-12-22 MED ORDER — QUETIAPINE FUMARATE 50 MG PO TABS
50.0000 mg | ORAL_TABLET | Freq: Every day | ORAL | 1 refills | Status: DC
Start: 2019-12-22 — End: 2020-02-08

## 2019-12-23 ENCOUNTER — Other Ambulatory Visit: Payer: Self-pay | Admitting: *Deleted

## 2019-12-23 MED ORDER — QUETIAPINE FUMARATE 100 MG PO TABS: 100.0000 mg | ORAL_TABLET | Freq: Every day | ORAL | 1 refills | Status: DC

## 2020-01-13 ENCOUNTER — Other Ambulatory Visit: Payer: Self-pay | Admitting: Physical Medicine and Rehabilitation

## 2020-01-25 ENCOUNTER — Other Ambulatory Visit: Payer: Self-pay | Admitting: Physical Medicine and Rehabilitation

## 2020-01-25 DIAGNOSIS — G8110 Spastic hemiplegia affecting unspecified side: Secondary | ICD-10-CM

## 2020-02-08 ENCOUNTER — Encounter: Payer: Self-pay | Admitting: Physical Medicine and Rehabilitation

## 2020-02-08 ENCOUNTER — Encounter: Payer: 59 | Attending: Physical Medicine and Rehabilitation | Admitting: Physical Medicine and Rehabilitation

## 2020-02-08 ENCOUNTER — Other Ambulatory Visit: Payer: Self-pay

## 2020-02-08 VITALS — BP 106/67 | HR 91 | Temp 98.2°F | Ht 62.0 in | Wt 123.4 lb

## 2020-02-08 DIAGNOSIS — M21612 Bunion of left foot: Secondary | ICD-10-CM

## 2020-02-08 DIAGNOSIS — G8110 Spastic hemiplegia affecting unspecified side: Secondary | ICD-10-CM | POA: Diagnosis not present

## 2020-02-08 DIAGNOSIS — I6330 Cerebral infarction due to thrombosis of unspecified cerebral artery: Secondary | ICD-10-CM

## 2020-02-08 MED ORDER — QUETIAPINE FUMARATE 200 MG PO TABS: 200.0000 mg | ORAL_TABLET | Freq: Every day | ORAL | 2 refills | Status: DC

## 2020-02-08 MED ORDER — BACLOFEN 10 MG PO TABS: 10.0000 mg | ORAL_TABLET | Freq: Three times a day (TID) | ORAL | 1 refills | Status: DC
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
## 2020-02-10 MED ORDER — ESOMEPRAZOLE MAGNESIUM 40 MG PO CPDR
40.0000 mg | DELAYED_RELEASE_CAPSULE | Freq: Every day | ORAL | 1 refills | Status: DC
Start: 2020-02-10 — End: 2022-01-08

## 2020-02-10 MED ORDER — TIZANIDINE HCL 4 MG PO TABS: 4.0000 mg | ORAL_TABLET | Freq: Three times a day (TID) | ORAL | 1 refills | Status: DC

## 2020-02-10 NOTE — Addendum Note (Signed)
Addended by: Horton Chin on: 02/10/2020 06:24 PM   Modules accepted: Orders

## 2020-03-01 ENCOUNTER — Other Ambulatory Visit: Payer: Self-pay | Admitting: Physical Medicine and Rehabilitation

## 2020-03-01 DIAGNOSIS — I6330 Cerebral infarction due to thrombosis of unspecified cerebral artery: Secondary | ICD-10-CM

## 2020-03-01 DIAGNOSIS — G8110 Spastic hemiplegia affecting unspecified side: Secondary | ICD-10-CM

## 2020-03-13 ENCOUNTER — Other Ambulatory Visit: Payer: Self-pay | Admitting: Physical Medicine and Rehabilitation

## 2020-03-20 ENCOUNTER — Encounter: Payer: 59 | Attending: Physical Medicine and Rehabilitation | Admitting: Physical Medicine and Rehabilitation

## 2020-03-20 ENCOUNTER — Other Ambulatory Visit: Payer: Self-pay

## 2020-03-20 ENCOUNTER — Encounter: Payer: Self-pay | Admitting: Physical Medicine and Rehabilitation

## 2020-03-20 VITALS — BP 98/62 | HR 76 | Temp 98.0°F | Ht 62.0 in | Wt 126.2 lb

## 2020-03-20 DIAGNOSIS — F4310 Post-traumatic stress disorder, unspecified: Secondary | ICD-10-CM | POA: Insufficient documentation

## 2020-03-20 DIAGNOSIS — G4700 Insomnia, unspecified: Secondary | ICD-10-CM | POA: Diagnosis not present

## 2020-03-20 DIAGNOSIS — F32A Depression, unspecified: Secondary | ICD-10-CM | POA: Insufficient documentation

## 2020-03-20 DIAGNOSIS — G8110 Spastic hemiplegia affecting unspecified side: Secondary | ICD-10-CM | POA: Diagnosis not present

## 2020-03-20 DIAGNOSIS — I6330 Cerebral infarction due to thrombosis of unspecified cerebral artery: Secondary | ICD-10-CM

## 2020-03-20 DIAGNOSIS — E785 Hyperlipidemia, unspecified: Secondary | ICD-10-CM | POA: Diagnosis not present

## 2020-03-20 DIAGNOSIS — G5601 Carpal tunnel syndrome, right upper limb: Secondary | ICD-10-CM | POA: Insufficient documentation

## 2020-03-20 DIAGNOSIS — I69354 Hemiplegia and hemiparesis following cerebral infarction affecting left non-dominant side: Secondary | ICD-10-CM | POA: Insufficient documentation

## 2020-03-20 DIAGNOSIS — F1910 Other psychoactive substance abuse, uncomplicated: Secondary | ICD-10-CM | POA: Diagnosis not present

## 2020-03-20 DIAGNOSIS — F17210 Nicotine dependence, cigarettes, uncomplicated: Secondary | ICD-10-CM | POA: Insufficient documentation

## 2020-03-20 DIAGNOSIS — F331 Major depressive disorder, recurrent, moderate: Secondary | ICD-10-CM

## 2020-03-20 MED ORDER — QUETIAPINE FUMARATE 200 MG PO TABS: 200.0000 mg | ORAL_TABLET | Freq: Every day | ORAL | 2 refills | Status: DC
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
## 2020-03-20 NOTE — Addendum Note (Signed)
Addended by: Horton Chin on: 03/20/2020 01:48 PM   Modules accepted: Orders

## 2020-03-20 NOTE — Addendum Note (Signed)
Addended by: Jocilynn Grade P on: 03/20/2020 02:23 PM   Modules accepted: Level of Service  

## 2020-03-27 ENCOUNTER — Other Ambulatory Visit: Payer: Self-pay | Admitting: *Deleted

## 2020-03-28 MED ORDER — TIZANIDINE HCL 4 MG PO TABS: 4.0000 mg | ORAL_TABLET | Freq: Three times a day (TID) | ORAL | 0 refills | Status: DC

## 2020-04-10 ENCOUNTER — Other Ambulatory Visit: Payer: Self-pay | Admitting: Physical Medicine and Rehabilitation

## 2020-04-10 DIAGNOSIS — G8110 Spastic hemiplegia affecting unspecified side: Secondary | ICD-10-CM

## 2020-04-30 ENCOUNTER — Other Ambulatory Visit: Payer: Self-pay | Admitting: *Deleted

## 2020-04-30 MED ORDER — QUETIAPINE FUMARATE 200 MG PO TABS: 200.0000 mg | ORAL_TABLET | Freq: Every day | ORAL | 2 refills | Status: DC

## 2020-06-04 ENCOUNTER — Ambulatory Visit (INDEPENDENT_AMBULATORY_CARE_PROVIDER_SITE_OTHER): Payer: 59 | Admitting: Adult Health

## 2020-06-04 ENCOUNTER — Other Ambulatory Visit: Payer: Self-pay

## 2020-06-04 ENCOUNTER — Encounter: Payer: Self-pay | Admitting: Adult Health

## 2020-06-04 VITALS — BP 109/75 | HR 90 | Ht 62.0 in | Wt 128.0 lb

## 2020-06-04 DIAGNOSIS — I63511 Cerebral infarction due to unspecified occlusion or stenosis of right middle cerebral artery: Secondary | ICD-10-CM | POA: Diagnosis not present

## 2020-06-04 DIAGNOSIS — I639 Cerebral infarction, unspecified: Secondary | ICD-10-CM

## 2020-06-04 DIAGNOSIS — G8114 Spastic hemiplegia affecting left nondominant side: Secondary | ICD-10-CM

## 2020-06-04 DIAGNOSIS — G43609 Persistent migraine aura with cerebral infarction, not intractable, without status migrainosus: Secondary | ICD-10-CM | POA: Diagnosis not present

## 2020-06-04 MED ORDER — DULOXETINE HCL 30 MG PO CPEP: 30.0000 mg | ORAL_CAPSULE | Freq: Every day | ORAL | 5 refills | Status: DC

## 2020-06-04 MED ORDER — ATORVASTATIN CALCIUM 20 MG PO TABS: 20.0000 mg | ORAL_TABLET | Freq: Every day | ORAL | 3 refills | Status: DC

## 2020-06-04 MED ORDER — ASPIRIN EC 81 MG PO TBEC: 81.0000 mg | DELAYED_RELEASE_TABLET | Freq: Every day | ORAL | 3 refills | Status: DC

## 2020-06-04 MED ORDER — TOPIRAMATE 50 MG PO TABS: 100.0000 mg | ORAL_TABLET | Freq: Every day | ORAL | 3 refills | Status: DC | PRN

## 2020-06-04 MED ORDER — NICOTINE 21 MG/24HR TD PT24: 21.0000 mg | MEDICATED_PATCH | Freq: Every day | TRANSDERMAL | 3 refills | Status: DC
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
## 2020-06-04 NOTE — Progress Notes (Signed)
I agree with the above plan 

## 2020-06-04 NOTE — Patient Instructions (Signed)
Continue working with therapies - if new orders are needed to restart in [place], please let me know - would likely greatly benefit from use of AFO brace - please ensure you contact the places that were recommended  Start duloxetine (Cymbalta) 30mg  daily which can help with both migraines and anxiety  Continue aspirin 81 mg daily  and atorvastatin  for secondary stroke prevention  Continue to follow up with PCP regarding cholesterol and blood pressure management  Maintain strict control of hypertension with blood pressure goal below 130/90 and cholesterol with LDL cholesterol (bad cholesterol) goal below 70 mg/dL.       Followup in the future with me in 6 months or call earlier if needed       Thank you for coming to see at [facility]. I hope we have been able to provide you high quality care today.  You may receive a patient satisfaction survey over the next few weeks. We would appreciate your feedback and comments so that we may continue to improve ourselves and the health of our patients.

## 2020-06-10 ENCOUNTER — Encounter: Payer: Self-pay | Admitting: Adult Health

## 2020-06-18 ENCOUNTER — Encounter: Payer: 59 | Attending: Physical Medicine and Rehabilitation | Admitting: Physical Medicine and Rehabilitation

## 2020-06-18 DIAGNOSIS — G8110 Spastic hemiplegia affecting unspecified side: Secondary | ICD-10-CM | POA: Insufficient documentation

## 2020-07-03 ENCOUNTER — Encounter: Payer: Self-pay | Admitting: Physical Medicine and Rehabilitation

## 2020-07-03 ENCOUNTER — Ambulatory Visit: Payer: 59 | Admitting: Obstetrics and Gynecology

## 2020-07-03 ENCOUNTER — Other Ambulatory Visit: Payer: Self-pay

## 2020-07-03 ENCOUNTER — Encounter (HOSPITAL_BASED_OUTPATIENT_CLINIC_OR_DEPARTMENT_OTHER): Payer: 59 | Admitting: Physical Medicine and Rehabilitation

## 2020-07-03 VITALS — BP 123/77 | HR 90 | Temp 98.7°F | Ht 62.0 in | Wt 132.2 lb

## 2020-07-03 DIAGNOSIS — G8110 Spastic hemiplegia affecting unspecified side: Secondary | ICD-10-CM

## 2020-07-03 DIAGNOSIS — F332 Major depressive disorder, recurrent severe without psychotic features: Secondary | ICD-10-CM

## 2020-07-03 MED ORDER — SUMATRIPTAN SUCCINATE 50 MG PO TABS: 50.0000 mg | ORAL_TABLET | ORAL | 0 refills | Status: DC | PRN

## 2020-07-03 MED ORDER — AMITRIPTYLINE HCL 10 MG PO TABS: 10.0000 mg | ORAL_TABLET | Freq: Every day | ORAL | 1 refills | Status: DC

## 2020-07-03 NOTE — Progress Notes (Deleted)
PCP:  [name], MD   No chief complaint on file.    HPI:      Jane Watkins is a 25 y.o. G0P0000 whose LMP was No LMP recorded. (Menstrual status: IUD)., presents today for her annual examination.  Her menses are {norm/abn:715}, lasting {number:22536} days.  Dysmenorrhea {dysmen:716}. She {does:18564} have intermenstrual bleeding.  Sex activity: single partner, contraception - IUD. Paragard placed 07/06/19 Last Pap: {BFXO:[id number]}  Results were: {norm/abn:16707::"no abnormalities"} /neg HPV DNA *** Hx of STDs: {STD hx:14358}  There is no FH of breast cancer. There is no FH of ovarian cancer. The patient {does:18564} do self-breast exams.  Tobacco use: {tob:20664} Alcohol use: {Alcohol:11675} No drug use.  Exercise: {exercise:31265}  She {does:18564} get adequate calcium and Vitamin D in her diet.   The pregnancy intention screening data noted above was reviewed. Potential methods of contraception were discussed. The patient elected to proceed with {Upstream End Methods:24109}.     Past Medical History:  Diagnosis Date  . AKI (acute kidney injury) (HCC) 2018   "from overdose"  . Anxiety   . Chlamydia   . Daily headache   . Depression   . Drug overdose 04/2016   [name] 05/01/2016  . GERD (gastroesophageal reflux disease)    "when I was younger; gone now" (09/21/2017)  . IV drug abuse (HCC) 09/20/2017  . Migraine    "q couple weeks" (09/21/2017)  . Opioid abuse (HCC)   . Overdose   . Stroke [facility])     Past Surgical History:  Procedure Laterality Date  . APPENDECTOMY  04/2013  . I & D EXTREMITY Left 09/20/2017   Procedure: IRRIGATION AND DEBRIDEMENT LEFT ARM;  Surgeon: Bradly Bienenstock, MD;  Location: [facility] OR;  Service: Orthopedics;  Laterality: Left;  . TEE WITHOUT CARDIOVERSION N/A 02/10/2019   Procedure: TRANSESOPHAGEAL ECHOCARDIOGRAM (TEE);  Surgeon: Debbe Odea, MD;  Location: [facility] ORS;  Service: Cardiovascular;  Laterality: N/A;  Polysubstance  abuser  . TONGUE SURGERY  ~ 2007   "related to lisp"  . TRACHEOSTOMY  04/2016   [name] 05/01/2016    Family History  Problem Relation Age of Onset  . Anemia Father   . Healthy Sister   . Thyroid cancer Maternal Grandmother   . Lung cancer Maternal Grandfather        metastasized  . Lung cancer Paternal Grandfather     Social History   Socioeconomic History  . Marital status: Significant Other    Spouse name: Not on file  . Number of children: Not on file  . Years of education: high school  . Highest education level: Not on file  Occupational History  . Not on file  Tobacco Use  . Smoking status: Current Every Day Smoker    Packs/day: 0.50    Years: 10.00    Pack years: 5.00    Types: Cigarettes  . Smokeless tobacco: Never Used  Vaping Use  . Vaping Use: Never used  Substance and Sexual Activity  . Alcohol use: No  . Drug use: Yes    Types: IV, Heroin, Cocaine, Marijuana    Comment: last use of heroin and cocaine was 01/2019, marijuana daily  . Sexual activity: Yes    Birth control/protection: I.U.D.    Comment: Paragard  Other Topics Concern  . Not on file  Social History Narrative   05/24/19   From: [place], [place]   Living: with boyfriend [name]) -- former husband died from overdose   Work: not currently, in the  application process for disability      Family: good relationship with dad who lives in [place], mom is in [place]. [place] relationship with bother and sister      Enjoys: training her puppy (corndog)      Exercise: walking, exercise bike in the apartment   Diet: not the best, tries to cook at home, fruit/veggies      Safety   Seat belts: Yes    Guns: No   Safe in relationships: Yes    Social Determinants of Corporate investment banker Strain: Not on file  Food Insecurity: Not on file  Transportation Needs: Not on file  Physical Activity: Not on file  Stress: Not on file  Social Connections: Not on file  Intimate Partner Violence: Not on file      Current Outpatient Medications:  .  acetaminophen (TYLENOL) 325 MG tablet, Take 2 tablets (650 mg total) by mouth every 4 (four) hours as needed for mild pain (or temp > 37.5 C (99.5 F))., Disp:  , Rfl:  .  amitriptyline (ELAVIL) 10 MG tablet, Take 1 tablet (10 mg total) by mouth at bedtime., Disp: 30 tablet, Rfl: 1 .  aspirin EC 81 MG tablet, Take 1 tablet (81 mg total) by mouth daily. Swallow whole., Disp: 90 tablet, Rfl: 3 .  atorvastatin (LIPITOR) 20 MG tablet, Take 1 tablet (20 mg total) by mouth daily at 6 PM., Disp: 90 tablet, Rfl: 3 .  baclofen (LIORESAL) 10 MG tablet, Take 1 tablet (10 mg total) by mouth 3 (three) times daily., Disp: 90 tablet, Rfl: 1 .  benzoyl peroxide 5 % external liquid, Apply topically 2 (two) times daily., Disp: , Rfl:  .  buprenorphine-naloxone (SUBOXONE) 8-2 mg SUBL SL tablet, Place 1 tablet under the tongue 3 (three) times daily., Disp: , Rfl:  .  busPIRone (BUSPAR) 10 MG tablet, TAKE 1 TABLET BY MOUTH THREE TIMES A DAY (Patient taking differently: Takes as needed), Disp: 270 tablet, Rfl: 2 .  Clindamycin-Benzoyl Per, Refr, gel, Apply 1 application topically daily., Disp: , Rfl:  .  clindamycin-benzoyl peroxide (BENZACLIN) gel, As directed, Disp: , Rfl:  .  doxycycline (VIBRA-TABS) 100 MG tablet, Take 100 mg by mouth 2 (two) times daily., Disp: , Rfl:  .  DULoxetine (CYMBALTA) 30 MG capsule, Take 1 capsule (30 mg total) by mouth daily., Disp: 30 capsule, Rfl: 5 .  esomeprazole (NEXIUM) 40 MG capsule, Take 1 capsule (40 mg total) by mouth daily at 12 noon., Disp: 30 capsule, Rfl: 1 .  nicotine (NICODERM CQ - DOSED IN MG/24 HOURS) 21 mg/24hr patch, Place 1 patch (21 mg total) onto the skin daily., Disp: 28 patch, Rfl: 3 .  pantoprazole (PROTONIX) 40 MG tablet, Take 1 tablet (40 mg total) by mouth daily., Disp: 30 tablet, Rfl: 0 .  QUEtiapine (SEROQUEL) 200 MG tablet, Take 1 tablet (200 mg total) by mouth at bedtime., Disp: 30 tablet, Rfl: 2 .  SUMAtriptan  (IMITREX) 50 MG tablet, Take 1 tablet (50 mg total) by mouth every 2 (two) hours as needed for migraine. May repeat in 2 hours if headache persists or recurs., Disp: 10 tablet, Rfl: 0 .  tiZANidine (ZANAFLEX) 4 MG tablet, Take 1 tablet (4 mg total) by mouth 3 (three) times daily., Disp: 270 tablet, Rfl: 0 .  topiramate (TOPAMAX) 50 MG tablet, Take 2 tablets (100 mg total) by mouth daily as needed (for acute migraine relief)., Disp: 60 tablet, Rfl: 3 .  tretinoin (RETIN-A) 0.05 %  cream, As directed, Disp: , Rfl:   Current Facility-Administered Medications:  .  paragard intrauterine copper IUD, , Intrauterine, Once, Jett Kulzer B, PA-C     ROS:  Review of Systems BREAST: No symptoms   Objective: There were no vitals taken for this visit.   OBGyn Exam  Results: No results found for this or any previous visit (from the past 24 hour(s)).  Assessment/Plan: No diagnosis found.  No orders of the defined types were placed in this encounter.            GYN counsel {counseling:16159}     F/U  No follow-ups on file.  Camar Guyton B. Jamarcus Laduke, PA-C 07/03/2020 11:14 AM

## 2020-07-03 NOTE — Addendum Note (Signed)
Addended by: Horton Chin on: 07/03/2020 10:43 AM   Modules accepted: Orders

## 2020-07-03 NOTE — Progress Notes (Signed)
Botox Injection for spasticity using needle US  guidance  Indication: Severe spasticity which interferes with ADL,mobility and/or  hygiene and is unresponsive to medication management and other conservative care Informed consent was obtained after describing risks and benefits of the procedure with the patient. This includes bleeding, bruising, infection, excessive weakness, or medication side effects. A REMS form is on file and signed. Number of units per muscle Left soleus:75U divided in 3 sites Left gastrocnemius:75U divided in 3 sites Left flexor pollicis longus:20U in 1 site Left flexor pollicis brevis: 25U in 1 site Left opponens pollicis:25 U in 1 site Left adductor pollicis: 20U in 1 site Left Flexor digitorus profundus: 50U Left flexor digitorum superficialis: 50U Flexor carpi radialis: 20U Flexor carpi ulnaris: 40U  All injections were done after obtaining appropriate US guidance and after negative drawback for blood. The patient tolerated the procedure well. Post procedure instructions were given. A followup appointment was made.

## 2020-07-04 ENCOUNTER — Telehealth: Payer: Self-pay | Admitting: Physical Medicine and Rehabilitation

## 2020-07-04 NOTE — Telephone Encounter (Signed)
LVM for patient to call back and let us know where she wants her PT and OT order sent.

## 2020-07-23 ENCOUNTER — Ambulatory Visit: Payer: 59 | Admitting: Occupational Therapy

## 2020-07-23 ENCOUNTER — Ambulatory Visit: Payer: 59 | Attending: Physical Medicine and Rehabilitation | Admitting: Physical Therapy

## 2020-07-23 ENCOUNTER — Encounter: Payer: Self-pay | Admitting: Physical Therapy

## 2020-07-23 ENCOUNTER — Other Ambulatory Visit: Payer: Self-pay

## 2020-07-23 DIAGNOSIS — R2681 Unsteadiness on feet: Secondary | ICD-10-CM

## 2020-07-23 DIAGNOSIS — R41844 Frontal lobe and executive function deficit: Secondary | ICD-10-CM

## 2020-07-23 DIAGNOSIS — Z9181 History of falling: Secondary | ICD-10-CM | POA: Diagnosis present

## 2020-07-23 DIAGNOSIS — R41840 Attention and concentration deficit: Secondary | ICD-10-CM | POA: Diagnosis present

## 2020-07-23 DIAGNOSIS — M6281 Muscle weakness (generalized): Secondary | ICD-10-CM | POA: Diagnosis present

## 2020-07-23 DIAGNOSIS — I69352 Hemiplegia and hemiparesis following cerebral infarction affecting left dominant side: Secondary | ICD-10-CM | POA: Diagnosis present

## 2020-07-23 DIAGNOSIS — R2689 Other abnormalities of gait and mobility: Secondary | ICD-10-CM | POA: Insufficient documentation

## 2020-07-23 DIAGNOSIS — R278 Other lack of coordination: Secondary | ICD-10-CM | POA: Diagnosis present

## 2020-07-23 NOTE — Therapy (Addendum)
[facility] [address] [place], [place], [address] Phone: [phone]   Fax:  [phone]  Physical Therapy Evaluation  Patient Details  Name: Jane EvangelistLydia Watkins MRN: [id number] Date of Birth: 03/01/1995 Referring Provider (PT): Carlis Abbottaulkar, Drema PryKrutika P, MD  Encounter Date: 07/23/2020   PT End of Session - 07/23/20 2101     Visit Number 1    Number of Visits 13    Date for PT Re-Evaluation 10/22/20    Authorization Type U[employer], 60 visit limit per discipline    Authorization - Visit Number 1    Authorization - Number of Visits 60    PT Start Time 1533    PT Stop Time 1615    PT Time Calculation (min) 42 min    Equipment Utilized During Treatment Gait belt    Activity Tolerance Patient tolerated treatment well    Behavior During Therapy [facility] for tasks assessed/performed             Past Medical History:  Diagnosis Date   AKI (acute kidney injury) (HCC) 2018   "from overdose"   Anxiety    Chlamydia    Daily headache    Depression    Drug overdose 04/2016   [name]/notes 05/01/2016   GERD (gastroesophageal reflux disease)    "when I was younger; gone now" (09/21/2017)   IV drug abuse (HCC) 09/20/2017   Migraine    "q couple weeks" (09/21/2017)   Opioid abuse (HCC)    Overdose    Stroke [facility](HCC)     Past Surgical History:  Procedure Laterality Date   APPENDECTOMY  04/2013   I & D EXTREMITY Left 09/20/2017   Procedure: IRRIGATION AND DEBRIDEMENT LEFT ARM;  Surgeon: Bradly Bienenstockrtmann, Fred, MD;  Location: [facility] OR;  Service: Orthopedics;  Laterality: Left;   TEE WITHOUT CARDIOVERSION N/A 02/10/2019   Procedure: TRANSESOPHAGEAL ECHOCARDIOGRAM (TEE);  Surgeon: Debbe OdeaAgbor-Etang, Brian, MD;  Location: [facility] ORS;  Service: Cardiovascular;  Laterality: N/A;  Polysubstance abuser   TONGUE SURGERY  ~ 2007   "related to lisp"   TRACHEOSTOMY  04/2016   [name]/notes 05/01/2016    There were no vitals filed for this visit.    Subjective Assessment - 07/23/20 1535      Subjective Gets Botox injections - gets it in her calf and hand. Can't move her toes on her left foot. Wants to get more normal function back. Has a foot up brace as well as an AFO that she has not worn in a while. Will have to find it - [place]. Had a fall yesterdy - slipped on some rocks. Had about 4-5 falls in the past 6 months.    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 1 hour    Patient Stated Goals wants to try some electrical stimulation, improve her walking    Currently in Pain? No/denies    Pain Onset More than a month ago                [facility] PT Assessment - 07/23/20 1543       Assessment   Medical Diagnosis CVA    Referring Provider (PT) Carlis Abbottaulkar, Drema PryKrutika P, MD    Onset Date/Surgical Date 02/07/19  CVA date   Hand Dominance Right    Prior Therapy PT - prior outpatient at [facility] and [facility]      Precautions   Precautions Fall      Balance Screen   Has the patient fallen in the past 6 months Yes    How many times? 4-5    Has the patient had a decrease in activity level because of a fear of falling?  Yes    Is the patient reluctant to leave their home because of a fear of falling?  No   prefers to be with someone in case something happens     Cabin crew Private residence    Living Arrangements Spouse/significant other   bf and his parents   Available Help at Discharge Family   significant other and his friends   Type of Home House    Home Access Stairs to enter    Entrance Stairs-Number of Steps 2    Entrance Stairs-Rails Right    Home Layout Two level    Home Equipment Shower seat;Other (comment);Walker - standard;Cane -  quad    Additional Comments bf helps with laundry, cooking tasks      Prior Function   Level of Independence Independent   prior to CVA   Vocation Full time employment    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    Leisure playing with her dog      Sensation   Light Touch Appears Intact    Proprioception Appears Intact   at B ankles     Coordination   Gross Motor Movements are Fluid and Coordinated No    Heel Shin Test impaired with LLE due to hemiparesis      Tone   Assessment Location Left Lower Extremity      ROM / Strength   AROM / PROM / Strength Strength      AROM   Overall AROM Comments limited L DF and eversion ROM due to weakness      PROM   Overall PROM Comments limited in L ankle DF      Strength   Strength Assessment Site Hip;Knee;Ankle    Right/Left Hip Right;Left    Right Hip Flexion 5/5    Right Hip Extension 4+/5    Right Hip ABduction 5/5    Left Hip Flexion 4+/5    Left Hip Extension 3-/5    Left Hip ABduction 5/5    Right/Left Knee Right;Left    Right Knee Flexion 5/5    Right Knee Extension 5/5    Left Knee Flexion 4+/5    Left Knee Extension 4+/5    Right/Left Ankle Right;Left    Right Ankle Dorsiflexion 5/5    Left Ankle Dorsiflexion 0/5    Left Ankle Plantar Flexion 1/5    Left Ankle Eversion 0/5      Transfers   Transfers Sit to Stand;Stand to Sit    Sit to Stand 7: Independent    Stand to Sit 7: Independent    Comments 30 second chair stand: 17 without UE support, more narrow BOS and incr inversion with LLE      Ambulation/Gait   Ambulation/Gait Yes    Ambulation/Gait Assistance 5: Supervision    Ambulation Distance (Feet) --   clinic distances   Assistive device None    Gait Pattern Step-through pattern;Decreased stride length;Lateral hip instability;Poor foot clearance - left;Left circumduction;Decreased weight shift to left;Decreased dorsiflexion - left;Decreased stance  time - left;Decreased  arm swing - right;Decreased arm swing - left   L toe contact   Ambulation Surface Level;Indoor    Gait velocity 9.94 seconds = 3.30 ft/sec    Stairs Yes    Stairs Assistance 6: Modified independent (Device/Increase time);5: Supervision    Stairs Assistance Details (indicate cue type and reason) supervision with step through pattern descending, pt performs with step to pattern at home    Stair Management Technique One rail Right;Alternating pattern;Step to pattern   step to descending   Number of Stairs 4   x2   Height of Stairs 6      High Level Balance   High Level Balance Comments mCTSIB: conditions 1-4 = 30 seconds, incr postural sway with condition 4      Functional Gait  Assessment   Gait assessed  Yes    Gait Level Surface Walks 20 ft in less than 7 sec but greater than 5.5 sec, uses assistive device, slower speed, mild gait deviations, or deviates 6-10 in outside of the 12 in walkway width.   6.63   Change in Gait Speed Able to smoothly change walking speed without loss of balance or gait deviation. Deviate no more than 6 in outside of the 12 in walkway width.    Gait with Horizontal Head Turns Performs head turns smoothly with no change in gait. Deviates no more than 6 in outside 12 in walkway width    Gait with Vertical Head Turns Performs head turns with no change in gait. Deviates no more than 6 in outside 12 in walkway width.    Gait and Pivot Turn Pivot turns safely within 3 sec and stops quickly with no loss of balance.    Step Over Obstacle Is able to step over one shoe box (4.5 in total height) without changing gait speed. No evidence of imbalance.    Gait with Narrow Base of Support Ambulates 4-7 steps.    Gait with Eyes Closed Walks 20 ft, slow speed, abnormal gait pattern, evidence for imbalance, deviates 10-15 in outside 12 in walkway width. Requires more than 9 sec to ambulate 20 ft.   9.84   Ambulating Backwards Walks 20 ft, slow speed, abnormal gait pattern, evidence for  imbalance, deviates 10-15 in outside 12 in walkway width.    Steps Two feet to a stair, must use rail.    Total Score 20    FGA comment: 20/30   moderate fall risk     LLE Tone   LLE Tone Hypertonic                        Objective measurements completed on examination: See above findings.               PT Education - 07/23/20 2100     Education provided Yes    Education Details POC, clinical findings, scheduling 1x a week at this time due to travel distance to clinic.    Person(s) Educated Patient    Methods Explanation    Comprehension Verbalized understanding              PT Short Term Goals - 07/24/20 0941       PT SHORT TERM GOAL #1   Title Pt will be independent with initial HEP in order to indicate improved strength and decreased fall risk. ALL STGS DUE 08/21/20    Time 4    Period Weeks    Status New  Target Date 08/21/20      PT SHORT TERM GOAL #2   Title Pt will undergo further assessment of with LTG written as appropriate in order to demo improved endurance for community mobility.    Time 4    Period Weeks    Status New      PT SHORT TERM GOAL #3   Title Pt will improve FGA score to at least a 22/30 in order to demo decr fall risk.    Baseline 20/30    Time 4    Period Weeks    Status New               PT Long Term Goals - 07/24/20 0943       PT LONG TERM GOAL #1   Title Pt will be independent with final HEP in order to indicate improved strength and decreased fall risk. ALL LTG DUE 09/18/20    Time 8    Period Weeks    Status New    Target Date 09/18/20      PT LONG TERM GOAL #2   Title goal to be written as appropriate in order to demo improved endurance for community mobility.    Time 8    Period Weeks    Status New      PT LONG TERM GOAL #3   Title Pt will improve FGA to at least 24/30 in order to demo decr fall risk.    Baseline 20/30    Time 8    Period Weeks    Status New      PT LONG  TERM GOAL #4   Title Pt will improve gait speed with no AD to at least 3.7 ft/sec for improved community mobility    Baseline 3.3 ft/sec    Time 8    Period Weeks    Status New      PT LONG TERM GOAL #5   Title Pt will ambulate at least 1000' outdoors over grass surfaces with mod I in order to demo improved community mobility.    Baseline --    Time 8    Period Weeks    Status New      PT LONG TERM GOAL #6   Time --    Status --                    Plan - 07/24/20 0947     Clinical Impression Statement Patient is a 25 year old female referred to Neuro OPPT for spastic L hemiparesis.   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 The following deficits were present during the exam: decr strength, impaired balance, gait abnormalities, decr ROM, impaired coordination, impaired tone LLE. Based on FGA, pt is a moderate risk for falls. Pt would benefit from skilled PT to address these impairments and functional limitations to maximize functional mobility independence    Personal Factors and Comorbidities Behavior Pattern;Past/Current Experience;Time since onset of injury/illness/exacerbation    Examination-Activity Limitations Bathing;Transfers;Bend;Carry;Reach Overhead;Stand;Locomotion Level;Stairs;Squat    Examination-Participation Restrictions Yard Work;Cleaning;Laundry;Community Activity;Meal Prep;Occupation    Stability/Clinical Decision Making Stable/Uncomplicated    Clinical Decision Making Moderate    Rehab Potential Good    PT Frequency 1x / week    PT Duration 12 weeks    PT Treatment/Interventions ADLs/Self Care Home Management;DME Instruction;Gait training;Stair training;Functional mobility training;Therapeutic activities;Therapeutic exercise;Balance  training;Neuromuscular re-education;Patient/family education;Vestibular;Energy  conservation;Electrical Stimulation;Passive range of motion;Manual techniques;Splinting;Orthotic Fit/Training;Aquatic Therapy    PT Next Visit Plan perform , initial HEP for strengthening, stretching, and balance. pt has not worn her AFO in months, may need to see if she needs to get a night splint due to decr DF ROM with LLE    Recommended Other Services pt may benefit for aquatic therapy for either OT/PT    Consulted and Agree with Plan of Care Patient             Patient will benefit from skilled therapeutic intervention in order to improve the following deficits and impairments:  Decreased activity tolerance, Decreased balance, Decreased endurance, Decreased mobility, Decreased strength, Postural dysfunction, Abnormal gait, Difficulty walking, Impaired tone, Decreased range of motion, Decreased coordination, Impaired UE functional use, Pain, Impaired flexibility  Visit Diagnosis: Muscle weakness (generalized)  Other lack of coordination  Other abnormalities of gait and mobility  Unsteadiness on feet  History of falling     Problem List Patient Active Problem List   Diagnosis Date Noted   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   Major depressive disorder, recurrent episode, severe (HCC) 06/21/2019   Postcoital UTI 05/24/2019   Spastic hemiparesis (HCC)    Vascular headache    Mood disorder in conditions classified elsewhere    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 [place], [place], [address] Phone: [phone]   Fax:  [phone]  Name: Jane Watkins MRN: [id number] Date of Birth: [birth year]

## 2020-07-23 NOTE — Addendum Note (Signed)
Addended by: Horton Chin on: 07/23/2020 08:39 AM   Modules accepted: Orders

## 2020-07-24 ENCOUNTER — Other Ambulatory Visit: Payer: Self-pay | Admitting: Physical Medicine and Rehabilitation

## 2020-07-24 ENCOUNTER — Telehealth: Payer: Self-pay | Admitting: Physical Therapy

## 2020-07-24 MED ORDER — GABAPENTIN 300 MG PO CAPS: 300.0000 mg | ORAL_CAPSULE | Freq: Every day | ORAL | 3 refills | Status: DC

## 2020-07-24 NOTE — Addendum Note (Signed)
Addended by: Drake Leach on: 07/24/2020 09:57 AM   Modules accepted: Orders

## 2020-07-24 NOTE — Therapy (Signed)
[facility] [address] [place], [place], [address] Phone: [phone]   Fax:  [phone]  Occupational Therapy Evaluation  Patient Details  Name: Jane EvangelistLydia Muchmore MRN: [id number] Date of Birth: 04/04/1995 Referring Provider (OT): Sula Sodaaulkar, Krutika   Encounter Date: 07/23/2020   OT End of Session - 07/23/20 1513     Visit Number 1    Number of Visits 13    Date for OT Re-Evaluation 10/15/20    Authorization Type UHC [id number]    Authorization Time Period Eval Date 07/24/2020  VL: 60OT (separate from 60PT)    OT Start Time 1445    OT Stop Time 1530    OT Time Calculation (min) 45 min    Activity Tolerance Patient tolerated treatment well    Behavior During Therapy [facility] for tasks assessed/performed             Past Medical History:  Diagnosis Date   AKI (acute kidney injury) (HCC) 2018   "from overdose"   Anxiety    Chlamydia    Daily headache    Depression    Drug overdose 04/2016   [name]/notes 05/01/2016   GERD (gastroesophageal reflux disease)    "when I was younger; gone now" (09/21/2017)   IV drug abuse (HCC) 09/20/2017   Migraine    "q couple weeks" (09/21/2017)   Opioid abuse (HCC)    Overdose    Stroke [facility](HCC)     Past Surgical History:  Procedure Laterality Date   APPENDECTOMY  04/2013   I & D EXTREMITY Left 09/20/2017   Procedure: IRRIGATION AND DEBRIDEMENT LEFT ARM;  Surgeon: Bradly Bienenstockrtmann, Fred, MD;  Location: [facility] OR;  Service: Orthopedics;  Laterality: Left;   TEE WITHOUT CARDIOVERSION N/A 02/10/2019   Procedure: TRANSESOPHAGEAL ECHOCARDIOGRAM (TEE);  Surgeon: Debbe OdeaAgbor-Etang, Brian, MD;  Location: [facility] ORS;  Service: Cardiovascular;  Laterality: N/A;  Polysubstance abuser   TONGUE SURGERY  ~ 2007   "related to lisp"   TRACHEOSTOMY  04/2016   [name]/notes 05/01/2016    There were no vitals filed for this visit.      [facility] - 07/23/20 1447       Assessment   Medical Diagnosis CVA    Referring Provider  (OT) Sula Sodaaulkar, Krutika    Onset Date/Surgical Date 02/07/19   CVA date   Hand Dominance Right    [facility]      Precautions   Precautions Fall      Balance Screen   Has the patient fallen in the past 6 months Yes   fell yesterday on some rocks     Home  Environment   Family/patient expects to be discharged to: Private residence    Living Arrangements Other relatives   lives with boyfriend and his parents   Available Help at Discharge Friend(s)    Type of Home House    Home Layout Two level   bed and bath upstairs   Bathroom Shower/Tub Tub/Shower unit    Press photographerHome Equipment Shower seat;Walker - 2 wheels;Cane -quad      Prior Function   Level of Independence Independent   independent prior to CVA 12/2019   Vocation Full time employment    Multimedia programmerVocation Requirements manager at [employer]    Leisure sleep, amusement parks, [employer] (taking care of her dog), spending time with family      ADL   Eating/Feeding Needs assist with cutting food    Grooming Other (comment)  needs assistance for styling hair   Upper Body Bathing Modified independent    Lower Body Bathing Modified independent    Upper Body Dressing Needs assist for fasteners    Lower Body Dressing Needs assist for fasteners    Toilet Transfer Modified independent    Toileting - Clothing Manipulation Modified independent    Toileting -  Hygiene Modified Independent    Tub/Shower Transfer Modified independent      IADL   Shopping Shops independently for small purchases    Light Housekeeping Maintains house alone or with occasional assistance    Meal Prep Plans, prepares and serves adequate meals independently   need some help with carrying items and cutting stuff   Community Mobility Drives own vehicle    Medication Management Is responsible for taking medication in correct dosages at correct time    Financial Management Manages financial matters independently (budgets, writes checks, pays rent,  bills goes to bank), collects and keeps track of income      Mobility   Mobility Status Needs assist    Mobility Status Comments uses walker or quad cane for long distances      Vision - History   Baseline Vision Wears glasses all the time   lost her glasses so not wearing currently     Vision Assessment   Comment pt denies any changes in vision      Cognition   Attention Focused    Focused Attention Appears intact    Memory Impaired    Memory Impairment Other (comment)   pt reports it's not good   Problem Solving Impaired      Sensation   Light Touch Appears Intact   pt reports left side does feel different   Hot/Cold Appears Intact      Coordination   Box and Blocks R 49 L 4      Tone   Assessment Location Left Upper Extremity      ROM / Strength   AROM / PROM / Strength PROM;AROM      AROM   Overall AROM Comments pt demonstrates active movement at all joints LUE but limited      PROM   PROM Assessment Site Shoulder;Elbow;Forearm;Wrist    Right/Left Shoulder Left    Left Shoulder Flexion 130 Degrees   AROM 110   Right/Left Elbow Left    Left Elbow Extension -30    Right/Left Forearm Left    Left Forearm Pronation 80 Degrees    Left Forearm Supination 80 Degrees    Right/Left Wrist Left    Left Wrist Extension 50 Degrees    Left Wrist Flexion 65 Degrees      Hand Function   Right Hand Grip (lbs) 57.9    Left Hand Gross Grasp Impaired    Left Hand Grip (lbs) 8.8    Comment LUE hand - pt reports before botox was significantly flexed in fist but not sitting at approx 75 degrees MCP flexion and can achieve 0-90 degrees PROM      LUE Tone   LUE Tone Hypertonic                               OT Short Term Goals - 07/24/20 1417       OT SHORT TERM GOAL #1   Title Pt will be independent with HEP    Time 4    Period Weeks    Status New  Target Date 08/21/20      OT SHORT TERM GOAL #2   Title Pt will demonstrate tying bilateral shoes  with one handed technique, bilateral hands or with adapted equipment PRN.    Time 4    Period Weeks    Status New      OT SHORT TERM GOAL #3   Title Pt will perform Box and Blocks with LUE with score of 6 or greater with divider for increase in functional use of LUE.    Baseline LUE 4 (without divider) RUE 49    Time 4    Period Weeks    Status New      OT SHORT TERM GOAL #4   Title Pt will verbalize understanding of adapted strategies and/or equipment PRN for increased safety and independence with ADLs and IADLs (cutting up vegetables and food, tying shoes, clasping bra, etc)    Time 4    Period Weeks    Status New               OT Long Term Goals - 07/24/20 1431       OT LONG TERM GOAL #1   Title Pt will be independent with updated HEP    Time 12    Period Weeks    Status New    Target Date 10/16/20      OT LONG TERM GOAL #2   Title Pt will demonstrate increased active shoulder flexion to 130 degrees with min compensations for preparing for reaching to comb hair and assistance styling, etc.    Baseline 130 PROM 110 AROM    Time 12    Period Weeks    Status New      OT LONG TERM GOAL #3   Title Pt will perform Box and Blocks with score of 10 or greater with LUE with divider in order to increase functional use and grasp/release with LUE.    Baseline 4 L (without divider) 49 R    Time 12    Period Weeks    Status New      OT LONG TERM GOAL #4   Title Pt will increase grip strength in LUE to 15 lbs or greater for increasing functional use of LUE    Baseline L 8.8 R 57.9    Time 12    Period Weeks    Status New      OT LONG TERM GOAL #5   Title Pt will demonstrate ability to style hair with adapted strateiges and/or equipment PRN.    Time 12    Period Weeks    Status New      OT LONG TERM GOAL #6   Title Pt will be independent with any wear and care of splints and/or orthoses PRN    Time 12    Period Weeks    Status New                    Plan - 07/23/20 1530     Clinical Impression Statement [name] is a 25 year old female that presents to Neuro OPOT s/p CVA in Dec 2020. Pt with PMH of IV drug use, polysubstance as well as tobacco abuse, anxiety. Pt. presented to [facility] on [facility]/07[facility]2019 after being found unresponsive.  EMS was called and pt received Narcan with some improvement in mental status.  Admission labs with WBC 20,700, potassium 5.2, BUN 25, creatinine 0.67, urine drug screen positive cocaine, marijuana and benzodiazepines, alcohol level less than  10, SARS coronavirus negative.  Cranial CT scan showed areas of indistinct low-density in the right basal ganglia with mass-effect on the right lateral ventricle.  Patient did not receive TPA.  MRI showed acute infarction of right MCA territory, affecting the basal ganglia and affecting the cortical and subcortical brain in a largely watershed distribution. Pt. had a previous inpatient rehab admission 05/01/2016 to 05/09/2016 for hypoxic encephalopathy after drug overdose requiring tracheostomy, and was later decannulated. Pt presents with spastic hemiplegia of LUE and received botox recently for flexors of wrist and hand, presents with unsteadiness on feet and decreased use of LUE. Pt would benefit from skilled occupational therapy to target spastic hemiplegia, functional use of LUE, increased independence with ADLs and IADLS.    OT Occupational Profile and History Detailed Assessment- Review of Records and additional review of physical, cognitive, psychosocial history related to current functional performance    Occupational performance deficits (Please refer to evaluation for details): ADL's;IADL's    Body Structure / Function / Physical Skills ADL;IADL;Coordination;Endurance;UE functional use;Decreased knowledge of precautions;Dexterity;FMC;ROM;Tone;Strength;GMC;Decreased knowledge of use of DME    Rehab Potential Good    Clinical Decision Making Several treatment options, min-mod task  modification necessary    Comorbidities Affecting Occupational Performance: May have comorbidities impacting occupational performance    Modification or Assistance to Complete Evaluation  Min-Moderate modification of tasks or assist with assess necessary to complete eval    OT Frequency 1x / week    OT Duration 12 weeks   1x/week for 12 weeks   OT Treatment/Interventions Self-care/ADL training;DME and/or AE instruction;Energy conservation;Therapeutic activities;Patient/family education;Therapeutic exercise;Neuromuscular education;Aquatic Therapy;Moist Heat;Electrical Stimulation;Ultrasound;Paraffin;Functional Mobility Training;Traction;Splinting;Fluidtherapy;Cognitive remediation/compensation;Passive range of motion;Manual Therapy    Plan resting hand splint?, HEP for LUE, appropriateness for aquatics?    Consulted and Agree with Plan of Care Patient             Patient will benefit from skilled therapeutic intervention in order to improve the following deficits and impairments:   Body Structure / Function / Physical Skills: ADL, IADL, Coordination, Endurance, UE functional use, Decreased knowledge of precautions, Dexterity, FMC, ROM, Tone, Strength, GMC, Decreased knowledge of use of DME       Visit Diagnosis: Spastic hemiplegia of left dominant side as late effect of cerebral infarction (HCC)  Muscle weakness (generalized)  Other lack of coordination  Unsteadiness on feet  Frontal lobe and executive function deficit  Attention and concentration deficit    Problem List Patient Active Problem List   Diagnosis Date Noted   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   Major depressive disorder, recurrent episode, severe (HCC) 06/21/2019   Postcoital UTI 05/24/2019   Spastic hemiparesis (HCC)    Vascular headache    Mood disorder in conditions classified elsewhere    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 [place], [place], [address] Phone: [phone]   Fax:  [phone]  Name: Pamela Watkins MRN: [id number] Date of Birth: 04/18/1995

## 2020-08-02 ENCOUNTER — Ambulatory Visit: Payer: 59

## 2020-08-02 ENCOUNTER — Other Ambulatory Visit: Payer: Self-pay

## 2020-08-02 ENCOUNTER — Encounter: Payer: Self-pay | Admitting: Occupational Therapy

## 2020-08-02 ENCOUNTER — Ambulatory Visit: Payer: 59 | Admitting: Occupational Therapy

## 2020-08-02 DIAGNOSIS — R278 Other lack of coordination: Secondary | ICD-10-CM

## 2020-08-02 DIAGNOSIS — I69352 Hemiplegia and hemiparesis following cerebral infarction affecting left dominant side: Secondary | ICD-10-CM

## 2020-08-02 DIAGNOSIS — R2681 Unsteadiness on feet: Secondary | ICD-10-CM

## 2020-08-02 DIAGNOSIS — R2689 Other abnormalities of gait and mobility: Secondary | ICD-10-CM

## 2020-08-02 DIAGNOSIS — M6281 Muscle weakness (generalized): Secondary | ICD-10-CM | POA: Diagnosis not present

## 2020-08-02 NOTE — Patient Instructions (Signed)
On knees and forearms on the floor.  Lift ribs slightly.   Rock your self back as if you were sitting on your feet. Slow sustained stretch,  Can slowly tip body toward left shoulder, then work toward sitting on left hip.   Big stretch for whole left side!

## 2020-08-02 NOTE — Therapy (Signed)
[facility] [address] [place], [place], [address] Phone: [phone]   Fax:  [phone]  Occupational Therapy Treatment  Patient Details  Name: Jane Watkins MRN: [id number] Date of Birth: Jul 03, 1995 Referring Provider (OT): Sula Soda   Encounter Date: 08/02/2020   OT End of Session - 08/02/20 1726     Visit Number 2    Number of Visits 13    Date for OT Re-Evaluation 10/15/20    Authorization Type UHC 2022    Authorization Time Period Eval Date 07/24/2020  VL: 60OT (separate from 60PT)    OT Start Time 1620    OT Stop Time 1700    OT Time Calculation (min) 40 min    Activity Tolerance Patient tolerated treatment well    Behavior During Therapy [facility] for tasks assessed/performed             Past Medical History:  Diagnosis Date   AKI (acute kidney injury) (HCC) 2018   "from overdose"   Anxiety    Chlamydia    Daily headache    Depression    Drug overdose 04/2016   [name] 05/01/2016   GERD (gastroesophageal reflux disease)    "when I was younger; gone now" (09/21/2017)   IV drug abuse (HCC) 09/20/2017   Migraine    "q couple weeks" (09/21/2017)   Opioid abuse (HCC)    Overdose    Stroke [facility])     Past Surgical History:  Procedure Laterality Date   APPENDECTOMY  04/2013   I & D EXTREMITY Left 09/20/2017   Procedure: IRRIGATION AND DEBRIDEMENT LEFT ARM;  Surgeon: Bradly Bienenstock, MD;  Location: [facility] OR;  Service: Orthopedics;  Laterality: Left;   TEE WITHOUT CARDIOVERSION N/A 02/10/2019   Procedure: TRANSESOPHAGEAL ECHOCARDIOGRAM (TEE);  Surgeon: Debbe Odea, MD;  Location: [facility] ORS;  Service: Cardiovascular;  Laterality: N/A;  Polysubstance abuser   TONGUE SURGERY  ~ 2007   "related to lisp"   TRACHEOSTOMY  04/2016   [name] 05/01/2016    There were no vitals filed for this visit.   Subjective Assessment - 08/02/20 1719     Subjective  Patient reports that hse does some stretches, and has  some hand exercises.  SHe reports being in therapy for the majority of time since 12/20.    Pertinent History Per chart. Pt. is a 25 y.o.female with a history of IV drug use, polysubstance as well as tobacco abuse, anxiety. Pt. presented to [facility] on 02/07/2019 after being found unresponsive.  EMS contacted patient received Narcan with some improvement in mental status.  Admission labs with WBC 20,700, potassium 5.2, BUN 25, creatinine 0.67, urine drug screen positive cocaine, marijuana and benzodiazepines, alcohol level less than 10, SARS coronavirus negative.  Cranial CT scan showed areas of indistinct low-density in the right basal ganglia with mass-effect on the right lateral ventricle.  Patient did not receive TPA.  MRI showed acute infarction of right MCA territory, affecting the basal ganglia and affecting the cortical and subcortical brain in a largely watershed distribution. Pt. had a previous inpatient rehab admission 05/01/2016 to 05/09/2016 for hypoxic encephalopathy after drug overdose requiring tracheostomy, and was later decannulated. Pt. resides with her boyfriend in an apartment. Pt. worked full time as a Production designer, theatre/television/film at [employer]. pt. enjoys travelling.    Patient Stated Goals Patient reports she would like to be as independent as possible with all her daily tasks.    Currently in Pain? No/denies    Pain Score 0-No  pain                          OT Treatments/Exercises (OP) - 08/02/20 0001       Neurological Re-education Exercises   Other Exercises 1 Modified 4point to address self stretch to left shoulder and elbow, left side trunk, and left hip.  See patient instructions.  Patient asked to bring splint to next visit to assess fit and need for custom splint.  Worked to increase weight thru hand, and to align MCP 3-5 into greater degrees of extension.  Worked to increase elbow extension in weight bearing, active IR/ER, and wrist extension.                    OT  Education - 08/02/20 1725     Education Details self stretch Left shoulder    Person(s) Educated Patient    Methods Explanation;Demonstration;Tactile cues;Verbal cues;Handout    Comprehension Verbalized understanding;Returned demonstration              OT Short Term Goals - 08/02/20 1727       OT SHORT TERM GOAL #1   Title Pt will be independent with HEP    Time 4    Period Weeks    Status New    Target Date 08/21/20      OT SHORT TERM GOAL #2   Title Pt will demonstrate tying bilateral shoes with one handed technique, bilateral hands or with adapted equipment PRN.    Time 4    Period Weeks    Status New      OT SHORT TERM GOAL #3   Title Pt will perform Box and Blocks with LUE with score of 6 or greater with divider for increase in functional use of LUE.    Baseline LUE 4 (without divider) RUE 49    Time 4    Period Weeks    Status New      OT SHORT TERM GOAL #4   Title Pt will verbalize understanding of adapted strategies and/or equipment PRN for increased safety and independence with ADLs and IADLs (cutting up vegetables and food, tying shoes, clasping bra, etc)    Time 4    Period Weeks    Status New               OT Long Term Goals - 08/02/20 1728       OT LONG TERM GOAL #1   Title Pt will be independent with updated HEP    Time 12    Period Weeks    Status New      OT LONG TERM GOAL #2   Title Pt will demonstrate increased active shoulder flexion to 130 degrees with min compensations for preparing for reaching to comb hair and assistance styling, etc.    Baseline 130 PROM 110 AROM    Time 12    Period Weeks    Status New      OT LONG TERM GOAL #3   Title Pt will perform Box and Blocks with score of 10 or greater with LUE with divider in order to increase functional use and grasp/release with LUE.    Baseline 4 L (without divider) 49 R    Time 12    Period Weeks    Status New      OT LONG TERM GOAL #4   Title Pt will increase grip strength  in LUE to 15 lbs or  greater for increasing functional use of LUE    Baseline L 8.8 R 57.9    Time 12    Period Weeks    Status New      OT LONG TERM GOAL #5   Title Pt will demonstrate ability to style hair with adapted strateiges and/or equipment PRN.    Time 12    Period Weeks    Status New      OT LONG TERM GOAL #6   Title Pt will be independent with any wear and care of splints and/or orthoses PRN    Time 12    Period Weeks    Status New                   Plan - 08/02/20 1726     Clinical Impression Statement Patient reprots that being more functionally with LUE is biggest goal of this round of OT    OT Occupational Profile and History Detailed Assessment- Review of Records and additional review of physical, cognitive, psychosocial history related to current functional performance    Occupational performance deficits (Please refer to evaluation for details): ADL's;IADL's    Body Structure / Function / Physical Skills ADL;IADL;Coordination;Endurance;UE functional use;Decreased knowledge of precautions;Dexterity;FMC;ROM;Tone;Strength;GMC;Decreased knowledge of use of DME    Rehab Potential Good    Clinical Decision Making Several treatment options, min-mod task modification necessary    Comorbidities Affecting Occupational Performance: May have comorbidities impacting occupational performance    Modification or Assistance to Complete Evaluation  Min-Moderate modification of tasks or assist with assess necessary to complete eval    OT Frequency 1x / week    OT Duration 12 weeks   1x/week for 12 weeks   OT Treatment/Interventions Self-care/ADL training;DME and/or AE instruction;Energy conservation;Therapeutic activities;Patient/family education;Therapeutic exercise;Neuromuscular education;Aquatic Therapy;Moist Heat;Electrical Stimulation;Ultrasound;Paraffin;Functional Mobility Training;Traction;Splinting;Fluidtherapy;Cognitive remediation/compensation;Passive range of  motion;Manual Therapy    Plan resting hand splint?, HEP for LUE, appropriateness for aquatics?    Consulted and Agree with Plan of Care Patient             Patient will benefit from skilled therapeutic intervention in order to improve the following deficits and impairments:   Body Structure / Function / Physical Skills: ADL, IADL, Coordination, Endurance, UE functional use, Decreased knowledge of precautions, Dexterity, FMC, ROM, Tone, Strength, GMC, Decreased knowledge of use of DME       Visit Diagnosis: Spastic hemiplegia of left dominant side as late effect of cerebral infarction (HCC)  Muscle weakness (generalized)  Other lack of coordination  Unsteadiness on feet    Problem List Patient Active Problem List   Diagnosis Date Noted   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   Major depressive disorder, recurrent episode, severe (HCC) 06/21/2019   Postcoital UTI 05/24/2019   Spastic hemiparesis (HCC)    Vascular headache    Mood disorder in conditions classified elsewhere    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   Polysubstance dependence in early, early partial, sustained full, or sustained partial remission (HCC)    Dyslipidemia    Ischemic stroke diagnosed during current admission (HCC) 02/13/2019   MDD (major depressive disorder), recurrent episode, moderate (HCC) 02/10/2019   CVA (cerebral vascular accident) (HCC) 02/07/2019   TBI (traumatic brain injury) (HCC) 05/01/2016   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

## 2020-08-02 NOTE — Therapy (Signed)
[facility] [address] [place], [place], [address] Phone: [phone]   Fax:  [phone]  Physical Therapy Treatment  Patient Details  Name: Jane Watkins MRN: [id number] Date of Birth: April 16, 1995 Referring Provider (PT): Carlis Abbott Drema Pry, MD   Encounter Date: 08/02/2020   PT End of Session - 08/02/20 1701     Visit Number 2    Number of Visits 13    Date for PT Re-Evaluation 10/22/20    Authorization Type UHC, 60 visit limit per discipline    Authorization - Visit Number 2    Authorization - Number of Visits 60    PT Start Time 1701    PT Stop Time 1745    PT Time Calculation (min) 44 min    Equipment Utilized During Treatment --    Activity Tolerance Patient tolerated treatment well    Behavior During Therapy [facility] for tasks assessed/performed             Past Medical History:  Diagnosis Date   AKI (acute kidney injury) (HCC) 2018   "from overdose"   Anxiety    Chlamydia    Daily headache    Depression    Drug overdose 04/2016   [name] 05/01/2016   GERD (gastroesophageal reflux disease)    "when I was younger; gone now" (09/21/2017)   IV drug abuse (HCC) 09/20/2017   Migraine    "q couple weeks" (09/21/2017)   Opioid abuse (HCC)    Overdose    Stroke [facility])     Past Surgical History:  Procedure Laterality Date   APPENDECTOMY  04/2013   I & D EXTREMITY Left 09/20/2017   Procedure: IRRIGATION AND DEBRIDEMENT LEFT ARM;  Surgeon: Bradly Bienenstock, MD;  [facility]: [facility] OR;  Service: Orthopedics;  Laterality: Left;   TEE WITHOUT CARDIOVERSION N/A 02/10/2019   Procedure: TRANSESOPHAGEAL ECHOCARDIOGRAM (TEE);  Surgeon: Debbe Odea, MD;  Location: [facility] ORS;  Service: Cardiovascular;  Laterality: N/A;  Polysubstance abuser   TONGUE SURGERY  ~ 2007   "related to lisp"   TRACHEOSTOMY  04/2016   [name] 05/01/2016    There were no vitals filed for this visit.   Subjective Assessment - 08/02/20 1703      Subjective Patient denies any new changes/complaints. Patient does report that her left foot seems to be wanting to lift out of the shoe. Interested in potential adaptive shoe. Reports had a fall saturday, she tried to step over a dog gate leading with her left foot and fell.    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    Limitations Walking;Lifting;House hold activities;Standing    How long can you walk comfortably? 1 hour    Patient Stated Goals wants to try some electrical stimulation, improve her walking    Currently in Pain? No/denies    Pain Onset More than a month ago                [facility] PT Assessment - 08/02/20 0001       6 Minute Walk- Baseline   6 Minute Walk- Baseline yes    BP (mmHg) 108/73    HR (bpm) 88    02 Sat (%RA) 100 %    Modified Borg Scale for Dyspnea 0- Nothing at all  6 Minute walk- Post Test   6 Minute Walk Post Test yes    BP (mmHg) 118/83    HR (bpm) 84    02 Sat (%RA) 100 %    Modified Borg Scale for Dyspnea 3- Moderate shortness of breath or breathing difficulty    Perceived Rate of Exertion (Borg) 13- Somewhat hard      6 minute walk test results    Aerobic Endurance Distance Walked 1513    Endurance additional comments no AFO, no AD. Denies pain.                [facility] Adult PT Treatment/Exercise - 08/02/20 0001       Ambulation/Gait   Ambulation/Gait Yes    Ambulation/Gait Assistance 5: Supervision    Ambulation/Gait Assistance Details with completion of ; see assesment tab    Ambulation Distance (Feet) --   clinic distance   Assistive device None    Gait Pattern Step-through pattern;Decreased stride length;Lateral hip instability;Poor foot clearance - left;Left circumduction;Decreased weight shift to left;Decreased dorsiflexion -  left;Decreased stance time - left;Decreased arm swing - right;Decreased arm swing - left    Ambulation Surface Level;Indoor      Self-Care   Self-Care Other Self-Care Comments    Other Self-Care Comments  PT speaking to patient regarding a night splint for LLE (ankle) to allow for low load prolonged stretch to further promote improvements in movements of ankle and improved positioning. Patent reports that she has one, PT requesting patient to bring  night splint into next session for assesment. Patient also reporting that toes are curling in shoes, potential need for cushion added in this area to promote improved positioning.      Exercises   Exercises Other Exercises;Knee/Hip    Other Exercises  Attempted self gastroc stretch with belt, utilizing right UE strength, increased challenge noted therefore PT educating patient on how to have family member/friend stretch gastroc manually, patient verbalized understanding and added to HEP. PT manually stretching gastroc 2 x 45 seconds. Also taught patient gastroc stretch at stairs with L heel dropping to floor, patient able to complete properly, completed 2 x 30 seconds. At countertop completed lateral monster walk x 2 laps, cues for technique/form. increased challenge when leading with LLE.      Knee/Hip Exercises: Supine   Bridges Strengthening;Both;Left;2 sets;10 reps;Limitations    Bridges Limitations completed bridge with BLE x 10 reps, no challenge ntoed. progressed to single leg bridge using LLE, and RLE propped on towel/pillow to reduce use and promote LLE strengthening x 10 reps.             Access Code: [email or web address] URL: [email or web address] Date: 08/02/2020 Prepared by: Jethro Bastos   Exercises Supine Ankle Dorsiflexion Stretch with Caregiver - 1 x daily - 7 x weekly - 1 sets - 3 reps - 30 - 60 second hold hold Standing Gastroc Stretch on Step with Counter Support - 1 x daily - 7 x weekly - 3 sets - 3 reps - 30 seconds  hold Single Leg Bridge - 1 x daily - 7 x weekly - 2 sets - 10 reps Squatting Lateral Monster Walk and Counter Support - 1 x daily - 7 x weekly - 1 sets - 3 reps       PT Education - 08/02/20 1844     Education provided Yes    Education Details results; initial HEP. bring in night splint at next session    Person(s) Educated Patient  Methods Explanation;Demonstration;Handout    Comprehension Verbalized understanding;Returned demonstration              PT Short Term Goals - 08/02/20 1847       PT SHORT TERM GOAL #1   Title Pt will be independent with initial HEP in order to indicate improved strength and decreased fall risk. ALL STGS DUE 08/21/20    Time 4    Period Weeks    Status New    Target Date 08/21/20      PT SHORT TERM GOAL #2   Title Pt will undergo further assessment of with LTG written as appropriate in order to demo improved endurance for community mobility.    Baseline 1513 ft    Time 4    Period Weeks    Status Achieved      PT SHORT TERM GOAL #3   Title Pt will improve FGA score to at least a 22/30 in order to demo decr fall risk.    Baseline 20/30    Time 4    Period Weeks    Status New               PT Long Term Goals - 08/02/20 1848       PT LONG TERM GOAL #1   Title Pt will be independent with final HEP in order to indicate improved strength and decreased fall risk. ALL LTG DUE 09/18/20    Time 8    Period Weeks    Status New      PT LONG TERM GOAL #2   Title Patient will be able to ambulate >/= 1900 ft with to indicate improved endurance for community mobility and within age related normsl    Baseline 1513 ft    Time 8    Period Weeks    Status Revised      PT LONG TERM GOAL #3   Title Pt will improve FGA to at least 24/30 in order to demo decr fall risk.    Baseline 20/30    Time 8    Period Weeks    Status New      PT LONG TERM GOAL #4   Title Pt will improve gait speed with no AD to at least 3.7 ft/sec  for improved community mobility    Baseline 3.3 ft/sec    Time 8    Period Weeks    Status New      PT LONG TERM GOAL #5   Title Pt will ambulate at least 1000' outdoors over grass surfaces with mod I in order to demo improved community mobility.    Time 8    Period Weeks    Status New                   Plan - 08/02/20 1748     Clinical Impression Statement Today's skilled PT session included assessment of test, patient able to ambulate x 1513 ft in this time. vitals stable and no significant SOB noted,  Continue to ambulate w/ abnormal gait due to gastroc tightness/tone. Rest of session spent establishing initial HEP focused on strengthening/stretching for LLE > RLE. Will continue to progress toward all LTGs.    Personal Factors and Comorbidities Behavior Pattern;Past/Current Experience;Time since onset of injury/illness/exacerbation    Examination-Activity Limitations Bathing;Transfers;Bend;Carry;Reach Overhead;Stand;Locomotion Level;Stairs;Squat    Examination-Participation Restrictions Yard Work;Cleaning;Laundry;Community Activity;Meal Prep;Occupation    Stability/Clinical Decision Making Stable/Uncomplicated    Rehab Potential Good  PT Frequency 1x / week    PT Duration 12 weeks    PT Treatment/Interventions ADLs/Self Care Home Management;DME Instruction;Gait training;Stair training;Functional mobility training;Therapeutic activities;Therapeutic exercise;Balance training;Neuromuscular re-education;Patient/family education;Vestibular;Energy conservation;Electrical Stimulation;Passive range of motion;Manual techniques;Splinting;Orthotic Fit/Training;Aquatic Therapy    PT Next Visit Plan review HEP, progressed as needed. DId we bring in night splint. Continue functional strengthening. Manual therapy to LLE. DF ROM with LLE    Consulted and Agree with Plan of Care Patient             Patient will benefit from skilled therapeutic intervention in order to improve the  following deficits and impairments:  Decreased activity tolerance, Decreased balance, Decreased endurance, Decreased mobility, Decreased strength, Postural dysfunction, Abnormal gait, Difficulty walking, Impaired tone, Decreased range of motion, Decreased coordination, Impaired UE functional use, Pain, Impaired flexibility  Visit Diagnosis: Muscle weakness (generalized)  Unsteadiness on feet  Other abnormalities of gait and mobility     Problem List Patient Active Problem List   Diagnosis Date Noted   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   Major depressive disorder, recurrent episode, severe (HCC) 06/21/2019   Postcoital UTI 05/24/2019   Spastic hemiparesis (HCC)    Vascular headache    Mood disorder in conditions classified elsewhere    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   Polysubstance dependence in early, early partial, sustained full, or sustained partial remission (HCC)    Dyslipidemia    Ischemic stroke diagnosed during current admission (HCC) 02/13/2019   MDD (major depressive disorder), recurrent episode, moderate (HCC) 02/10/2019   CVA (cerebral vascular accident) (HCC) 02/07/2019   TBI (traumatic brain injury) (HCC) 05/01/2016   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

## 2020-08-02 NOTE — Patient Instructions (Signed)
Access Code: [email or web address] URL: [email or web address] Date: 08/02/2020 Prepared by: Jethro Bastos  Exercises Supine Ankle Dorsiflexion Stretch with Caregiver - 1 x daily - 7 x weekly - 1 sets - 3 reps - 30 - 60 second hold hold Standing Gastroc Stretch on Step with Counter Support - 1 x daily - 7 x weekly - 3 sets - 3 reps - 30 seconds hold Single Leg Bridge - 1 x daily - 7 x weekly - 2 sets - 10 reps Squatting Lateral Monster Walk and Counter Support - 1 x daily - 7 x weekly - 1 sets - 3 reps

## 2020-08-10 ENCOUNTER — Other Ambulatory Visit: Payer: Self-pay

## 2020-08-10 ENCOUNTER — Encounter: Payer: Self-pay | Admitting: Occupational Therapy

## 2020-08-10 ENCOUNTER — Ambulatory Visit: Payer: 59 | Attending: Physical Medicine and Rehabilitation | Admitting: Occupational Therapy

## 2020-08-10 ENCOUNTER — Ambulatory Visit: Payer: 59

## 2020-08-10 DIAGNOSIS — R41844 Frontal lobe and executive function deficit: Secondary | ICD-10-CM | POA: Diagnosis present

## 2020-08-10 DIAGNOSIS — R41840 Attention and concentration deficit: Secondary | ICD-10-CM

## 2020-08-10 DIAGNOSIS — I69352 Hemiplegia and hemiparesis following cerebral infarction affecting left dominant side: Secondary | ICD-10-CM | POA: Diagnosis present

## 2020-08-10 DIAGNOSIS — R2681 Unsteadiness on feet: Secondary | ICD-10-CM | POA: Diagnosis present

## 2020-08-10 DIAGNOSIS — R278 Other lack of coordination: Secondary | ICD-10-CM | POA: Diagnosis present

## 2020-08-10 DIAGNOSIS — R2689 Other abnormalities of gait and mobility: Secondary | ICD-10-CM

## 2020-08-10 DIAGNOSIS — M6281 Muscle weakness (generalized): Secondary | ICD-10-CM

## 2020-08-10 NOTE — Therapy (Signed)
[facility] [address] [place], [place], [address] Phone: [phone]   Fax:  [phone]  Physical Therapy Treatment  Patient Details  Name: Jane Watkins MRN: [id number] Date of Birth: May 11, 1995 Referring Provider (PT): Carlis Abbott Drema Pry, MD   Encounter Date: 08/10/2020   PT End of Session - 08/10/20 1449     Visit Number 3    Number of Visits 13    Date for PT Re-Evaluation 10/22/20    Authorization Type UHC, 60 visit limit per discipline    Authorization - Visit Number 3    Authorization - Number of Visits 60    PT Start Time 1447    PT Stop Time 1528    PT Time Calculation (min) 41 min    Activity Tolerance Patient tolerated treatment well    Behavior During Therapy [facility] for tasks assessed/performed             Past Medical History:  Diagnosis Date   AKI (acute kidney injury) (HCC) 2018   "from overdose"   Anxiety    Chlamydia    Daily headache    Depression    Drug overdose 04/2016   [name] 05/01/2016   GERD (gastroesophageal reflux disease)    "when I was younger; gone now" (09/21/2017)   IV drug abuse (HCC) 09/20/2017   Migraine    "q couple weeks" (09/21/2017)   Opioid abuse (HCC)    Overdose    Stroke [facility])     Past Surgical History:  Procedure Laterality Date   APPENDECTOMY  04/2013   I & D EXTREMITY Left 09/20/2017   Procedure: IRRIGATION AND DEBRIDEMENT LEFT ARM;  Surgeon: Bradly Bienenstock, MD;  Location: [facility] OR;  Service: Orthopedics;  Laterality: Left;   TEE WITHOUT CARDIOVERSION N/A 02/10/2019   Procedure: TRANSESOPHAGEAL ECHOCARDIOGRAM (TEE);  Surgeon: Debbe Odea, MD;  Location: [facility] ORS;  Service: Cardiovascular;  Laterality: N/A;  Polysubstance abuser   TONGUE SURGERY  ~ 2007   "related to lisp"   TRACHEOSTOMY  04/2016   [name] 05/01/2016    There were no vitals filed for this visit.   Subjective Assessment - 08/10/20 1449     Subjective Patient no new changes/complaints.  Patient denies pain. Reports no new falls. Reports exercises went well, was sore after last session but went away after 1-2 days. Patient reports unable to find night splint.    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    Limitations Walking;Lifting;House hold activities;Standing    How long can you walk comfortably? 1 hour    Patient Stated Goals wants to try some electrical stimulation, improve her walking    Currently in Pain? No/denies    Pain Onset More than a month ago               [facility] Adult PT Treatment/Exercise - 08/10/20 0001       Transfers   Transfers Sit to Stand;Stand to Sit    Sit to Stand 5: Supervision    Stand to Sit 5: Supervision    Comments completed sit <> stands with 2" block under RLE to promote LLE weight shift, completed 2 x 10 reps.      Ambulation/Gait   Ambulation/Gait Yes    Ambulation/Gait Assistance 5: Supervision    Ambulation/Gait Assistance  Details throughout therapy gym with activities    Ambulation Distance (Feet) --   clinic distance   Assistive device None    Gait Pattern Step-through pattern;Decreased stride length;Lateral hip instability;Poor foot clearance - left;Left circumduction;Decreased weight shift to left;Decreased dorsiflexion - left;Decreased stance time - left;Decreased arm swing - right;Decreased arm swing - left    Ambulation Surface Level;Indoor      High Level Balance   High Level Balance Activities Negotiating over obstacles    High Level Balance Comments with ambulation completed walking with stepping over obstacle (hurdles) compelted x 4 laps x 30'. Increased challenge with LLE trailing often circumducting to clear, tactile cues from PT. PT cueing to try to step over with whichever lower extremity comes natural.      Neuro  Re-ed    Neuro Re-ed Details  In // bars: with LLE positioned on 6" step completed forward lunges with UE support, working on improved hip/knee flexion x 10 reps, cues for weight shift to LLE, as patient likes to keep weight on RLE. faciliation for maintain heel contact.      Exercises   Exercises Knee/Hip    Other Exercises  Completed gastroc stretch standing with LLE positioned off step, completed stretch 2 x 30 seconds. PT completed manual stretch into eversion, 3 x 30 seconds. As well as manual DF stretch 2 x 30 seconds for improved positioning.      Knee/Hip Exercises: Aerobic   Other Aerobic Completed SciFit with BLE, used strap to keep RLE positioned. Completed x 5 minutes on Level 2.5, with PT providing cues for reciprocal movements.      Knee/Hip Exercises: Standing   Forward Step Up Left;1 set;15 reps;Hand Hold: 1;Step Height: 6"    Forward Step Up Limitations completed steps up with LLE leading, working on improved hip knee/flexion, avoidance of cirumduction completed x 10 reps, PT providing tactile cues. Cues for upright posture.                      PT Short Term Goals - 08/02/20 1847       PT SHORT TERM GOAL #1   Title Pt will be independent with initial HEP in order to indicate improved strength and decreased fall risk. ALL STGS DUE 08/21/20    Time 4    Period Weeks    Status New    Target Date 08/21/20      PT SHORT TERM GOAL #2   Title Pt will undergo further assessment of with LTG written as appropriate in order to demo improved endurance for community mobility.    Baseline 1513 ft    Time 4    Period Weeks    Status Achieved      PT SHORT TERM GOAL #3   Title Pt will improve FGA score to at least a 22/30 in order to demo decr fall risk.    Baseline 20/30    Time 4    Period Weeks    Status New               PT Long Term Goals - 08/02/20 1848       PT LONG TERM GOAL #1   Title Pt will be independent with final HEP in order to  indicate improved strength and decreased fall risk. ALL LTG DUE 09/18/20    Time 8    Period Weeks    Status New      PT LONG TERM GOAL #2  Title Patient will be able to ambulate >/= 1900 ft with 6MWT to indicate improved endurance for community mobility and within age related normsl    Baseline 1513 ft    Time 8    Period Weeks    Status Revised      PT LONG TERM GOAL #3   Title Pt will improve FGA to at least 24/30 in order to demo decr fall risk.    Baseline 20/30    Time 8    Period Weeks    Status New      PT LONG TERM GOAL #4   Title Pt will improve gait speed with no AD to at least 3.7 ft/sec for improved community mobility    Baseline 3.3 ft/sec    Time 8    Period Weeks    Status New      PT LONG TERM GOAL #5   Title Pt will ambulate at least 1000' outdoors over grass surfaces with mod I in order to demo improved community mobility.    Time 8    Period Weeks    Status New                   Plan - 08/10/20 1530     Clinical Impression Statement Continued today's session focused on functional strengthening, weight shift/stance on LLE and stretching to promote improved positioning. Patient unable to locate night splint, interested in obtaining new. PT to reach out for MD order. Will continue to progress toward all LTGs.    Personal Factors and Comorbidities Behavior Pattern;Past/Current Experience;Time since onset of injury/illness/exacerbation    Examination-Activity Limitations Bathing;Transfers;Bend;Carry;Reach Overhead;Stand;Locomotion Level;Stairs;Squat    Examination-Participation Restrictions Yard Work;Cleaning;Laundry;Community Activity;Meal Prep;Occupation    Stability/Clinical Decision Making Stable/Uncomplicated    Rehab Potential Good    PT Frequency 1x / week    PT Duration 12 weeks    PT Treatment/Interventions ADLs/Self Care Home Management;DME Instruction;Gait training;Stair training;Functional mobility training;Therapeutic  activities;Therapeutic exercise;Balance training;Neuromuscular re-education;Patient/family education;Vestibular;Energy conservation;Electrical Stimulation;Passive range of motion;Manual techniques;Splinting;Orthotic Fit/Training;Aquatic Therapy    PT Next Visit Plan Continue to progress HEP. Did we recieved MD order for night splint? Weight shift, lunges. Continue functional strengthening. Manual therapy to LLE. DF ROM with LLE    Consulted and Agree with Plan of Care Patient             Patient will benefit from skilled therapeutic intervention in order to improve the following deficits and impairments:  Decreased activity tolerance, Decreased balance, Decreased endurance, Decreased mobility, Decreased strength, Postural dysfunction, Abnormal gait, Difficulty walking, Impaired tone, Decreased range of motion, Decreased coordination, Impaired UE functional use, Pain, Impaired flexibility  Visit Diagnosis: Muscle weakness (generalized)  Unsteadiness on feet  Other abnormalities of gait and mobility     Problem List Patient Active Problem List   Diagnosis Date Noted   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   Major depressive disorder, recurrent episode, severe (HCC) 06/21/2019   Postcoital UTI 05/24/2019   Spastic hemiparesis (HCC)    Vascular headache    Mood disorder in conditions classified elsewhere    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   Polysubstance dependence in early, early partial, sustained full, or sustained partial remission (HCC)    Dyslipidemia    Ischemic stroke diagnosed during current admission (HCC) 02/13/2019   MDD (major depressive disorder), recurrent episode, moderate (HCC) 02/10/2019   CVA (cerebral vascular accident) (HCC) 02/07/2019   TBI (traumatic brain injury) (HCC) 05/01/2016   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

## 2020-08-10 NOTE — Therapy (Addendum)
[facility] [address] [place], [place], [address] Phone: [phone]   Fax:  [phone]  Occupational Therapy Treatment  Patient Details  Name: Jane Watkins MRN: [id number] Date of Birth: [birth year] Referring Provider (OT): Sula Soda   Encounter Date: 08/10/2020   OT End of Session - 08/10/20 1530     Visit Number 3    Number of Visits 13    Date for OT Re-Evaluation 10/15/20    Authorization Type UHC 2022    Authorization Time Period Eval Date 07/24/2020  VL: 60OT (separate from 60PT)    OT Start Time 1529    OT Stop Time 1610    OT Time Calculation (min) 41 min    Activity Tolerance Patient tolerated treatment well    Behavior During Therapy [facility] for tasks assessed/performed             Past Medical History:  Diagnosis Date   AKI (acute kidney injury) (HCC) 2018   "from overdose"   Anxiety    Chlamydia    Daily headache    Depression    Drug overdose 04/2016   [name] 05/01/2016   GERD (gastroesophageal reflux disease)    "when I was younger; gone now" (09/21/2017)   IV drug abuse (HCC) 09/20/2017   Migraine    "q couple weeks" (09/21/2017)   Opioid abuse (HCC)    Overdose    Stroke [facility])     Past Surgical History:  Procedure Laterality Date   APPENDECTOMY  04/2013   I & D EXTREMITY Left 09/20/2017   Procedure: IRRIGATION AND DEBRIDEMENT LEFT ARM;  Surgeon: Bradly Bienenstock, MD;  Location: [facility] OR;  Service: Orthopedics;  Laterality: Left;   TEE WITHOUT CARDIOVERSION N/A 02/10/2019   Procedure: TRANSESOPHAGEAL ECHOCARDIOGRAM (TEE);  Surgeon: Debbe Odea, MD;  Location: [facility] ORS;  Service: Cardiovascular;  Laterality: N/A;  Polysubstance abuser   TONGUE SURGERY  ~ 2007   "related to lisp"   TRACHEOSTOMY  04/2016   [name] 05/01/2016    There were no vitals filed for this visit.   Subjective Assessment - 08/10/20 1529     Subjective  Pt denies any pain. Pt did not bring splint in with  her today. Reports does not wear it really.    Pertinent History Per chart. Pt. is a 26 y.o.female with a history of IV drug use, polysubstance as well as tobacco abuse, anxiety. Pt. presented to [facility] on 02/07/2019 after being found unresponsive.  EMS contacted patient received Narcan with some improvement in mental status.  Admission labs with WBC 20,700, potassium 5.2, BUN 25, creatinine 0.67, urine drug screen positive cocaine, marijuana and benzodiazepines, alcohol level less than 10, SARS coronavirus negative.  Cranial CT scan showed areas of indistinct low-density in the right basal ganglia with mass-effect on the right lateral ventricle.  Patient did not receive TPA.  MRI showed acute infarction of right MCA territory, affecting the basal ganglia and affecting the cortical and subcortical brain in a largely watershed distribution. Pt. had a previous inpatient rehab admission 05/01/2016 to 05/09/2016 for hypoxic encephalopathy after drug overdose requiring tracheostomy, and was later decannulated. Pt. resides with her boyfriend in an apartment. Pt. worked full time as a Production designer, theatre/television/film at [employer]. pt. enjoys travelling.    Patient Stated Goals Patient reports she would like to be as independent as possible with all her daily tasks.    Currently in Pain? No/denies  Sink Stretch for LUE to maintain grip on lip of sink with staggered stance for increased stability  Grasp/Release of color cylindrical pegs with mod difficulty  Flipping Jumbo Cards with LUE with mod difficulty. Pt with increased ease with repetitions  Table Stretches x 12 reps forward, horizontal abduction, circumduction  LUE functional use mimicing drinking with cone and LUE - pt able to maintain and sustain grasp and demonstrate elbow flexion and extension with good functional movements.                    OT Short Term Goals - 08/02/20 1727       OT SHORT TERM GOAL #1   Title Pt will be independent  with HEP    Time 4    Period Weeks    Status New    Target Date 08/21/20      OT SHORT TERM GOAL #2   Title Pt will demonstrate tying bilateral shoes with one handed technique, bilateral hands or with adapted equipment PRN.    Time 4    Period Weeks    Status New      OT SHORT TERM GOAL #3   Title Pt will perform Box and Blocks with LUE with score of 6 or greater with divider for increase in functional use of LUE.    Baseline LUE 4 (without divider) RUE 49    Time 4    Period Weeks    Status New      OT SHORT TERM GOAL #4   Title Pt will verbalize understanding of adapted strategies and/or equipment PRN for increased safety and independence with ADLs and IADLs (cutting up vegetables and food, tying shoes, clasping bra, etc)    Time 4    Period Weeks    Status New               OT Long Term Goals - 08/02/20 1728       OT LONG TERM GOAL #1   Title Pt will be independent with updated HEP    Time 12    Period Weeks    Status New      OT LONG TERM GOAL #2   Title Pt will demonstrate increased active shoulder flexion to 130 degrees with min compensations for preparing for reaching to comb hair and assistance styling, etc.    Baseline 130 PROM 110 AROM    Time 12    Period Weeks    Status New      OT LONG TERM GOAL #3   Title Pt will perform Box and Blocks with score of 10 or greater with LUE with divider in order to increase functional use and grasp/release with LUE.    Baseline 4 L (without divider) 49 R    Time 12    Period Weeks    Status New      OT LONG TERM GOAL #4   Title Pt will increase grip strength in LUE to 15 lbs or greater for increasing functional use of LUE    Baseline L 8.8 R 57.9    Time 12    Period Weeks    Status New      OT LONG TERM GOAL #5   Title Pt will demonstrate ability to style hair with adapted strateiges and/or equipment PRN.    Time 12    Period Weeks    Status New      OT LONG TERM GOAL #6   Title Pt  will be independent  with any wear and care of splints and/or orthoses PRN    Time 12    Period Weeks    Status New                   Plan - 08/10/20 1617     Clinical Impression Statement Pt with increased range of motion and grasp/release with LUE.    OT Occupational Profile and History Detailed Assessment- Review of Records and additional review of physical, cognitive, psychosocial history related to current functional performance    Occupational performance deficits (Please refer to evaluation for details): ADL's;IADL's    Body Structure / Function / Physical Skills ADL;IADL;Coordination;Endurance;UE functional use;Decreased knowledge of precautions;Dexterity;FMC;ROM;Tone;Strength;GMC;Decreased knowledge of use of DME    Rehab Potential Good    Clinical Decision Making Several treatment options, min-mod task modification necessary    Comorbidities Affecting Occupational Performance: May have comorbidities impacting occupational performance    Modification or Assistance to Complete Evaluation  Min-Moderate modification of tasks or assist with assess necessary to complete eval    OT Frequency 1x / week    OT Duration 12 weeks   1x/week for 12 weeks   OT Treatment/Interventions Self-care/ADL training;DME and/or AE instruction;Energy conservation;Therapeutic activities;Patient/family education;Therapeutic exercise;Neuromuscular education;Aquatic Therapy;Moist Heat;Electrical Stimulation;Ultrasound;Paraffin;Functional Mobility Training;Traction;Splinting;Fluidtherapy;Cognitive remediation/compensation;Passive range of motion;Manual Therapy    Plan resting hand splint? (didnt bring in this session), HEP for LUE, appropriateness for aquatics?    Consulted and Agree with Plan of Care Patient             Patient will benefit from skilled therapeutic intervention in order to improve the following deficits and impairments:   Body Structure / Function / Physical Skills: ADL, IADL, Coordination, Endurance,  UE functional use, Decreased knowledge of precautions, Dexterity, FMC, ROM, Tone, Strength, GMC, Decreased knowledge of use of DME       Visit Diagnosis: Muscle weakness (generalized)  Other abnormalities of gait and mobility  Spastic hemiplegia of left dominant side as late effect of cerebral infarction (HCC)  Frontal lobe and executive function deficit  Other lack of coordination  Attention and concentration deficit  Unsteadiness on feet    Problem List Patient Active Problem List   Diagnosis Date Noted   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   Major depressive disorder, recurrent episode, severe (HCC) 06/21/2019   Postcoital UTI 05/24/2019   Spastic hemiparesis (HCC)    Vascular headache    Mood disorder in conditions classified elsewhere    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 [place], [place], [address] Phone: [phone]   Fax:  [phone]  Name: Jane Watkins MRN: [id number] Date of Birth: 04-21-1995

## 2020-08-14 ENCOUNTER — Telehealth: Payer: Self-pay | Admitting: Physical Therapy

## 2020-08-14 ENCOUNTER — Ambulatory Visit: Payer: 59 | Admitting: Occupational Therapy

## 2020-08-14 ENCOUNTER — Other Ambulatory Visit: Payer: Self-pay

## 2020-08-14 ENCOUNTER — Encounter: Payer: Self-pay | Admitting: Occupational Therapy

## 2020-08-14 ENCOUNTER — Ambulatory Visit: Payer: 59 | Admitting: Physical Therapy

## 2020-08-14 ENCOUNTER — Encounter: Payer: Self-pay | Admitting: Physical Therapy

## 2020-08-14 DIAGNOSIS — M6281 Muscle weakness (generalized): Secondary | ICD-10-CM

## 2020-08-14 DIAGNOSIS — R41840 Attention and concentration deficit: Secondary | ICD-10-CM

## 2020-08-14 DIAGNOSIS — R2689 Other abnormalities of gait and mobility: Secondary | ICD-10-CM

## 2020-08-14 DIAGNOSIS — I69352 Hemiplegia and hemiparesis following cerebral infarction affecting left dominant side: Secondary | ICD-10-CM

## 2020-08-14 DIAGNOSIS — R2681 Unsteadiness on feet: Secondary | ICD-10-CM

## 2020-08-14 DIAGNOSIS — R41844 Frontal lobe and executive function deficit: Secondary | ICD-10-CM

## 2020-08-14 DIAGNOSIS — R278 Other lack of coordination: Secondary | ICD-10-CM

## 2020-08-14 NOTE — Therapy (Signed)
[facility] [address] [place], [place], [address] Phone: [phone]   Fax:  [phone]  Physical Therapy Treatment  Patient Details  Name: Jane Watkins MRN: [id number] Date of Birth: 08/28/1995 Referring Provider (PT): Carlis Abbott Drema Pry, MD   Encounter Date: 08/14/2020   PT End of Session - 08/14/20 1651     Visit Number 4    Number of Visits 13    Date for PT Re-Evaluation 10/22/20    Authorization Type UHC, 60 visit limit per discipline    Authorization - Visit Number 4    Authorization - Number of Visits 60    PT Start Time 1605    PT Stop Time 1648    PT Time Calculation (min) 43 min    Activity Tolerance Patient tolerated treatment well    Behavior During Therapy [facility] for tasks assessed/performed             Past Medical History:  Diagnosis Date   AKI (acute kidney injury) (HCC) 2018   "from overdose"   Anxiety    Chlamydia    Daily headache    Depression    Drug overdose 04/2016   [name] 05/01/2016   GERD (gastroesophageal reflux disease)    "when I was younger; gone now" (09/21/2017)   IV drug abuse (HCC) 09/20/2017   Migraine    "q couple weeks" (09/21/2017)   Opioid abuse (HCC)    Overdose    [facility]     Past Surgical History:  Procedure Laterality Date   APPENDECTOMY  04/2013   I & D EXTREMITY Left 09/20/2017   Procedure: IRRIGATION AND DEBRIDEMENT LEFT ARM;  Surgeon: Bradly Bienenstock, MD;  Location: [facility] OR;  Service: Orthopedics;  Laterality: Left;   TEE WITHOUT CARDIOVERSION N/A 02/10/2019   Procedure: TRANSESOPHAGEAL ECHOCARDIOGRAM (TEE);  Surgeon: Debbe Odea, MD;  Location: [facility] ORS;  Service: Cardiovascular;  Laterality: N/A;  Polysubstance abuser   TONGUE SURGERY  ~ 2007   "related to lisp"   TRACHEOSTOMY  04/2016   [name] 05/01/2016    There were no vitals filed for this visit.   Subjective Assessment - 08/14/20 1605     Subjective Went to the beach over the  weekend, ankle is feeling sore, requesting to work on stretches this time.    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    Limitations Walking;Lifting;House hold activities;Standing    How long can you walk comfortably? 1 hour    Patient Stated Goals wants to try some electrical stimulation, improve her walking    Currently in Pain? No/denies   "just some in her foot when she walks too much"   Pain Onset More than a month ago                               [facility] Adult PT Treatment/Exercise - 08/14/20 1620       Transfers   Transfers Sit to Stand;Stand to Sit    Sit to Stand 5: Supervision    Comments staggered stance sit <> stand with LLE posteriorly for incr weight shift and closed chain ankle DF stretch      Exercises   Exercises Other Exercises    Other Exercises  x10 reps  staggered stance mini squats with LLE posteriorly, cues for technique and weight shift      Knee/Hip Exercises: Aerobic   Other Aerobic Completed SciFit with BLE. completed x 5 minutes on Level 2.5 - cues to keep ankle on peddle for additional stretch      Knee/Hip Exercises: Standing   Forward Step Up Left;2 sets;10 reps;Hand Hold: 1;Hand Hold: 0;Step Height: 4"    Forward Step Up Limitations performed with bringing RLE to step x10 reps and then floating RLE for SLS with single UE support for balance x10 reps, cues for quad/glute activation    Other Standing Knee Exercises with LLE on 4" step, gently shifting weight forwards into a lunge for ankle DF stretch with holds for a couple seconds x10 reps, therapist helping LLE maintain heel contact for an additional stretch      Knee/Hip Exercises: Supine   Bridges Strengthening;AROM;Left;1 set;10 reps    Bridges Limitations with LLE closer to body and  RLE extended, cues for full ROM      Manual Therapy   Manual Therapy Passive ROM    Manual therapy comments PROM into ankle DF x10 reps, ankle eversion x10 reps with LLE - pt supine, prolonged stretch into ankle DF 4 x 30 seconds, ankle eversion 3 x 30 seconds                 Balance Exercises - 08/14/20 1633       Balance Exercises: Standing   Rockerboard Anterior/posterior;Limitations    Rockerboard Limitations rocking board first with UE support for ankle ROM x10 reps, then with no UE support for balance x10 reps with intermittent UE support for balance, x10 reps head turns, 2 x 10 reps head nods (incr difficulty), x10 reps mini squats - with use of mirror as visual cue for weight shifting and therapist providing tactile cues to keep L ankle on the board for additional stretch and for incr weight bearing through LLE    Sidestepping 3 reps;Limitations    Sidestepping Limitations down and back 3 reps on blue foam beam, beginning with UE support and then progressing to none                 PT Short Term Goals - 08/02/20 1847       PT SHORT TERM GOAL #1   Title Pt will be independent with initial HEP in order to indicate improved strength and decreased fall risk. ALL STGS DUE 08/21/20    Time 4    Period Weeks    Status New    Target Date 08/21/20      PT SHORT TERM GOAL #2   Title Pt will undergo further assessment of 6MWT with LTG written as appropriate in order to demo improved endurance for community mobility.    Baseline 1513 ft    Time 4    Period Weeks    Status Achieved      PT SHORT TERM GOAL #3   Title Pt will improve FGA score to at least a 22/30 in order to demo decr fall risk.    Baseline 20/30    Time 4    Period Weeks    Status New               PT Long Term Goals - 08/02/20 1848       PT LONG TERM GOAL #1   Title Pt will be independent with final HEP in order to indicate improved strength and decreased  fall risk. ALL LTG DUE 09/18/20     Time 8    Period Weeks    Status New      PT LONG TERM GOAL #2   Title Patient will be able to ambulate >/= 1900 ft with to indicate improved endurance for community mobility and within age related normsl    Baseline 1513 ft    Time 8    Period Weeks    Status Revised      PT LONG TERM GOAL #3   Title Pt will improve FGA to at least 24/30 in order to demo decr fall risk.    Baseline 20/30    Time 8    Period Weeks    Status New      PT LONG TERM GOAL #4   Title Pt will improve gait speed with no AD to at least 3.7 ft/sec for improved community mobility    Baseline 3.3 ft/sec    Time 8    Period Weeks    Status New      PT LONG TERM GOAL #5   Title Pt will ambulate at least 1000' outdoors over grass surfaces with mod I in order to demo improved community mobility.    Time 8    Period Weeks    Status New                   Plan - 08/14/20 1654     Clinical Impression Statement Today's skilled session focused on functional strengthening, LLE ankle ROM with closed chain activities and PROM, and weight shifting activities to LLE. Pt tolerated session well. Pt with incr difficulty with balance on compliant surfaces with head nods. Will continue to progress towards LTGs.    Personal Factors and Comorbidities Behavior Pattern;Past/Current Experience;Time since onset of injury/illness/exacerbation    Examination-Activity Limitations Bathing;Transfers;Bend;Carry;Reach Overhead;Stand;Locomotion Level;Stairs;Squat    Examination-Participation Restrictions Yard Work;Cleaning;Laundry;Community Activity;Meal Prep;Occupation    Stability/Clinical Decision Making Stable/Uncomplicated    Rehab Potential Good    PT Frequency 1x / week    PT Duration 12 weeks    PT Treatment/Interventions ADLs/Self Care Home Management;DME Instruction;Gait training;Stair training;Functional mobility training;Therapeutic activities;Therapeutic exercise;Balance training;Neuromuscular  re-education;Patient/family education;Vestibular;Energy conservation;Electrical Stimulation;Passive range of motion;Manual techniques;Splinting;Orthotic Fit/Training;Aquatic Therapy    PT Next Visit Plan check STGs. Continue to progress HEP - add balance to HEP. order for night splint for LLE night splint? Weight shift, lunges. Continue functional strengthening. Manual therapy to LLE. DF ROM with LLE    Consulted and Agree with Plan of Care Patient             Patient will benefit from skilled therapeutic intervention in order to improve the following deficits and impairments:  Decreased activity tolerance, Decreased balance, Decreased endurance, Decreased mobility, Decreased strength, Postural dysfunction, Abnormal gait, Difficulty walking, Impaired tone, Decreased range of motion, Decreased coordination, Impaired UE functional use, Pain, Impaired flexibility  Visit Diagnosis: Muscle weakness (generalized)  Unsteadiness on feet  Other abnormalities of gait and mobility  Spastic hemiplegia of left dominant side as late effect of cerebral infarction [facility])     Problem List Patient Active Problem List   Diagnosis Date Noted   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   Major depressive disorder, recurrent episode, severe (HCC) 06/21/2019   Postcoital UTI 05/24/2019   Spastic hemiparesis (HCC)    Vascular headache    Mood disorder in conditions classified elsewhere    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   Polysubstance dependence in early,  early partial, sustained full, or sustained partial remission (HCC)    Dyslipidemia    Ischemic stroke diagnosed during current admission (HCC) 02/13/2019   MDD (major depressive disorder), recurrent episode, moderate (HCC) 02/10/2019   CVA (cerebral vascular accident) (HCC) 02/07/2019   TBI (traumatic brain injury) (HCC) 05/01/2016   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

## 2020-08-14 NOTE — Telephone Encounter (Signed)
Dr. Carlis Abbott,  Jane Watkins has been seen by Physical Therapy at [facility]. The patient would benefit from an order for a night splint for LLE (ankle) for improved ankle ROM.   If you agree, please place an order in [facility] workque in [facility] or fax the order to [phone]. Thank you, Sherlie Ban, PT, DPT 08/14/20 12:29 PM    [facility] [address] [place], [place]  [address] Phone:  [phone] Fax:  [phone]

## 2020-08-14 NOTE — Telephone Encounter (Signed)
Thanks so much! If you could place an order that specifies a night splint for L ankle and it can either be faxed to [phone] or to the [facility] workque in [employer].

## 2020-08-14 NOTE — Patient Instructions (Signed)
Supine Shoulder Flexion AAROM with Hands Clasped REPS: 10  SETS: 2  HOLD: ~2 sec Begin lying on your back with your hands clasped together. Raise your arms overhead as far as you are able, keeping your elbows straight, then return to the starting position and repeat. Tip: Make sure to use your unaffected arm to guide the movement and do not arch your back during the exercise.  Seated AAROM Elbow Flexion/Extension with Clasped Hands REPS: 10  SETS: 2  HOLD: ~2 sec Begin sitting upright in a chair with your elbows bent and your hands clasped in front of you. Move your arms forward so that your elbows are slightly in front of you. Bend your elbow as much as possible using your affected arm. Then straighten your elbow as much as you can. Use your opposite side to help as much as needed. Tip: Make sure you get as much range of motion as possible without causing pain. Do not excessively shrug your shoulders.  Seated AAROM Elbow Supination/Pronation with Clasped Hands REPS: 10  SETS: 2  HOLD: ~2 sec Begin sitting upright in a chair with your elbows bent and your hands clasped in front of you. Using your unaffected arm, twist your palm down towards the ground, and then reverse the action and twist your palm up towards the sky. Use your opposite side to help as much as needed. Tip: Make sure you get as much range of motion as possible without causing pain.  Seated AAROM Wrist Flexion/Extension with Clasped Hands REPS: 10  SETS: 2  HOLD: ~2 sec Begin sitting upright in a chair with your elbows bent and your hands clasped in front of you. Bend your wrist as much as possible using your affected arm. Then extend your wrist in the opposite direction as much as you can. Use your opposite hand to help as much as needed. Tip: Make sure you get as much range of motion as possible without causing pain. Do not excessively shrug your shoulders.

## 2020-08-15 NOTE — Therapy (Signed)
[facility] Health Outpt Rehabilitation [facility] [address] [place], [place], [address] Phone: [phone]   Fax:  [phone]  Occupational Therapy Treatment  Patient Details  Name: Jane Watkins MRN: [id number] Date of Birth: 24-Feb-1995 Referring Provider (OT): Sula Soda   Encounter Date: 08/14/2020   OT End of Session - 08/14/20 1455     Visit Number 4    Number of Visits 13    Date for OT Re-Evaluation 10/15/20    Authorization Type UHC 2022    Authorization Time Period Eval Date 07/24/2020  VL: 60OT (separate from 60PT)    OT Start Time 1453   pt arrival time   OT Stop Time 1533    OT Time Calculation (min) 40 min    Activity Tolerance Patient tolerated treatment well    Behavior During Therapy [facility] for tasks assessed/performed             Past Medical History:  Diagnosis Date   AKI (acute kidney injury) (HCC) 2018   "from overdose"   Anxiety    Chlamydia    Daily headache    Depression    Drug overdose 04/2016   [name] 05/01/2016   GERD (gastroesophageal reflux disease)    "when I was younger; gone now" (09/21/2017)   IV drug abuse (HCC) 09/20/2017   Migraine    "q couple weeks" (09/21/2017)   Opioid abuse (HCC)    Overdose    Stroke [facility])     Past Surgical History:  Procedure Laterality Date   APPENDECTOMY  04/2013   I & D EXTREMITY Left 09/20/2017   Procedure: IRRIGATION AND DEBRIDEMENT LEFT ARM;  Surgeon: Bradly Bienenstock, MD;  Location: [facility] OR;  Service: Orthopedics;  Laterality: Left;   TEE WITHOUT CARDIOVERSION N/A 02/10/2019   Procedure: TRANSESOPHAGEAL ECHOCARDIOGRAM (TEE);  Surgeon: Debbe Odea, MD;  Location: [facility] ORS;  Service: Cardiovascular;  Laterality: N/A;  Polysubstance abuser   TONGUE SURGERY  ~ 2007   "related to lisp"   TRACHEOSTOMY  04/2016   [name] 05/01/2016    There were no vitals filed for this visit.   Subjective Assessment - 08/14/20 1454     Subjective  Pt reported feeling  pulling/stretching during AAROM exercises, but that they were not painful    Pertinent History Per chart. Pt. is a 25 y.o.female with a history of IV drug use, polysubstance as well as tobacco abuse, anxiety. Pt. presented to [facility] on 02/07/2019 after being found unresponsive.  EMS contacted patient received Narcan with some improvement in mental status.  Admission labs with WBC 20,700, potassium 5.2, BUN 25, creatinine 0.67, urine drug screen positive cocaine, marijuana and benzodiazepines, alcohol level less than 10, SARS coronavirus negative.  Cranial CT scan showed areas of indistinct low-density in the right basal ganglia with mass-effect on the right lateral ventricle.  Patient did not receive TPA.  MRI showed acute infarction of right MCA territory, affecting the basal ganglia and affecting the cortical and subcortical brain in a largely watershed distribution. Pt. had a previous inpatient rehab admission 05/01/2016 to 05/09/2016 for hypoxic encephalopathy after drug overdose requiring tracheostomy, and was later decannulated. Pt. resides with her boyfriend in an apartment. Pt. worked full time as a Production designer, theatre/television/film at [employer]. pt. enjoys travelling.    Patient Stated Goals Patient reports she would like to be as independent as possible with all her daily tasks.    Currently in Pain? No/denies             OT Treatment/Exercises -  08/14/20    Neuro Reeducation Grasp and release of plastic stacking cones, focusing on fully opening hand/extending fingers during release. Pt demo'd difficulty w/ standard cones, activity was graded down to stacking/unstacking smaller cones w/ positive results  Picking up 1" blocks w/ pinch prehension and placing them in stacks of 3-5. Pt requested to complete thumb abduction stretch between sets, which did appear to improve success; OT encouraged pt to incorporate R hand to assist w/ positioning prn    PROM OT-facilitated gentle PROM of composite MCP extension/flexion to  decrease stiffness and increase functional use. Pt tolerated activity w/out difficulty. Due to stiffness, OT also discussed potential benefit of custom resting hand orthosis and encouraged pt to bring her current splint next session (pt did not bring it in this session)    AAROM AAROM of L shoulder flexion w/ hands clasped; completed 10x slowly, w/ OT encouraging pt to actively engage LUE during each rep to facilitate neuro reed in addition to stretching  Pt also returned demonstration for AAROM w/ hands clasped focusing on elbow extension, forearm supination, and wrist flexion/extension. OT provided modeling, verbal cues, and pacing to facilitate appropriate positioning/alignment during stretches         OT Education - 08/14/20 1636     Education Details LUE HEP (self-stretches for L shoulder, elbow, forearm, wrist); OT also reviewed potential benefit/purpose of resting hand orthosis    Person(s) Educated Patient    Methods Explanation;Demonstration;Handout    Comprehension Verbalized understanding;Returned demonstration             OT Short Term Goals - 08/14/20 1457       OT SHORT TERM GOAL #1   Title Pt will be independent with HEP    Time 4    Period Weeks    Status On-going    Target Date 08/21/20      OT SHORT TERM GOAL #2   Title Pt will demonstrate tying bilateral shoes with one handed technique, bilateral hands or with adapted equipment PRN.    Time 4    Period Weeks    Status On-going      OT SHORT TERM GOAL #3   Title Pt will perform Box and Blocks with LUE with score of 6 or greater with divider for increase in functional use of LUE.    Baseline LUE 4 (without divider) RUE 49    Time 4    Period Weeks    Status On-going   08/14/20 - practiced grasp/release of blocks     OT SHORT TERM GOAL #4   Title Pt will verbalize understanding of adapted strategies and/or equipment PRN for increased safety and independence with ADLs and IADLs (cutting up vegetables and  food, tying shoes, clasping bra, etc)    Time 4    Period Weeks    Status On-going             OT Long Term Goals - 08/14/20 1508       OT LONG TERM GOAL #1   Title Pt will be independent with updated HEP    Time 12    Period Weeks    Status On-going      OT LONG TERM GOAL #2   Title Pt will demonstrate increased active shoulder flexion to 130 degrees with min compensations for preparing for reaching to comb hair and assistance styling, etc.    Baseline 130 PROM 110 AROM    Time 12    Period Weeks  Status On-going      OT LONG TERM GOAL #3   Title Pt will perform Box and Blocks with score of 10 or greater with LUE with divider in order to increase functional use and grasp/release with LUE.    Baseline 4 L (without divider) 49 R    Time 12    Period Weeks    Status On-going      OT LONG TERM GOAL #4   Title Pt will increase grip strength in LUE to 15 lbs or greater for increasing functional use of LUE    Baseline L 8.8 R 57.9    Time 12    Period Weeks    Status On-going      OT LONG TERM GOAL #5   Title Pt will demonstrate ability to style hair with adapted strateiges and/or equipment PRN.    Time 12    Period Weeks    Status On-going   08/14/20 - reports completing this w/ Mod I at home     OT LONG TERM GOAL #6   Title Pt will be independent with any wear and care of splints and/or orthoses PRN    Time 12    Period Weeks    Status On-going             Plan - 08/14/20 1644     Clinical Impression Statement HEP including self-stretching for LUE initiated this session due to stiffness limiting functional use of L hand and UE. Grasp and release of full grasp and pincer grasp also facilitated w/ limitations primarily in finger extension as well as control and strength.   OT Occupational Profile and History Detailed Assessment- Review of Records and additional review of physical, cognitive, psychosocial history related to current functional performance     Occupational performance deficits (Please refer to evaluation for details): ADL's;IADL's    Body Structure / Function / Physical Skills ADL;IADL;Coordination;Endurance;UE functional use;Decreased knowledge of precautions;Dexterity;FMC;ROM;Tone;Strength;GMC;Decreased knowledge of use of DME    Rehab Potential Good    Clinical Decision Making Several treatment options, min-mod task modification necessary    Comorbidities Affecting Occupational Performance: May have comorbidities impacting occupational performance    Modification or Assistance to Complete Evaluation  Min-Moderate modification of tasks or assist with assess necessary to complete eval    OT Frequency 1x / week    OT Duration 12 weeks   1x/week for 12 weeks   OT Treatment/Interventions Self-care/ADL training;DME and/or AE instruction;Energy conservation;Therapeutic activities;Patient/family education;Therapeutic exercise;Neuromuscular education;Aquatic Therapy;Moist Heat;Electrical Stimulation;Ultrasound;Paraffin;Functional Mobility Training;Traction;Splinting;Fluidtherapy;Cognitive remediation/compensation;Passive range of motion;Manual Therapy    Plan resting hand splint? (didnt bring in this session), review HEP for LUE, appropriateness for aquatics?    Consulted and Agree with Plan of Care Patient             Patient will benefit from skilled therapeutic intervention in order to improve the following deficits and impairments:   Body Structure / Function / Physical Skills: ADL, IADL, Coordination, Endurance, UE functional use, Decreased knowledge of precautions, Dexterity, FMC, ROM, Tone, Strength, GMC, Decreased knowledge of use of DME   Visit Diagnosis: Spastic hemiplegia of left dominant side as late effect of cerebral infarction (HCC)  Muscle weakness (generalized)  Frontal lobe and executive function deficit  Other lack of coordination  Attention and concentration deficit    Problem List Patient Active Problem  List   Diagnosis Date Noted   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   Major depressive disorder, recurrent episode, severe (HCC) 06/21/2019   Postcoital  UTI 05/24/2019   Spastic hemiparesis (HCC)    Vascular headache    Mood disorder in conditions classified elsewhere    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 [place], [place], [address] Phone: [phone]   Fax:  [phone]  Name: Jane Watkins MRN: [id number] Date of Birth: 05-25-1995

## 2020-08-16 ENCOUNTER — Other Ambulatory Visit: Payer: Self-pay | Admitting: Physical Medicine and Rehabilitation

## 2020-08-23 ENCOUNTER — Ambulatory Visit: Payer: 59

## 2020-08-23 ENCOUNTER — Other Ambulatory Visit: Payer: Self-pay | Admitting: Physical Medicine and Rehabilitation

## 2020-08-23 ENCOUNTER — Encounter: Payer: Self-pay | Admitting: Occupational Therapy

## 2020-08-23 ENCOUNTER — Ambulatory Visit: Payer: 59 | Admitting: Occupational Therapy

## 2020-08-23 ENCOUNTER — Other Ambulatory Visit: Payer: Self-pay

## 2020-08-23 DIAGNOSIS — M6281 Muscle weakness (generalized): Secondary | ICD-10-CM

## 2020-08-23 DIAGNOSIS — R2689 Other abnormalities of gait and mobility: Secondary | ICD-10-CM

## 2020-08-23 DIAGNOSIS — R2681 Unsteadiness on feet: Secondary | ICD-10-CM

## 2020-08-23 DIAGNOSIS — R278 Other lack of coordination: Secondary | ICD-10-CM

## 2020-08-23 DIAGNOSIS — G8110 Spastic hemiplegia affecting unspecified side: Secondary | ICD-10-CM

## 2020-08-23 DIAGNOSIS — I69352 Hemiplegia and hemiparesis following cerebral infarction affecting left dominant side: Secondary | ICD-10-CM
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
## 2020-08-23 NOTE — Therapy (Signed)
[facility] [address] [place], [place], [address] Phone: [phone]   Fax:  [phone]  Occupational Therapy Treatment and Progress Update  Patient Details  Name: Jane Watkins MRN: [id number] Date of Birth: 1995-12-29 Referring Provider (OT): Leeroy Cha  This progress report covers dates of service from 07/24/20-08/23/20  Encounter Date: 08/23/2020   OT End of Session - 08/23/20 1849     Visit Number 5    Number of Visits 13    Date for OT Re-Evaluation 10/15/20    Authorization Type UHC 2022    Authorization Time Period Eval Date 07/24/2020  VL: 60OT (separate from 60PT)    OT Start Time 1745    OT Stop Time 1830    OT Time Calculation (min) 45 min    Activity Tolerance Patient tolerated treatment well    Behavior During Therapy [facility] for tasks assessed/performed             Past Medical History:  Diagnosis Date   AKI (acute kidney injury) ([place]) 2018   "from overdose"   Anxiety    Chlamydia    Daily headache    Depression    Drug overdose 04/2016   [name] 05/01/2016   GERD (gastroesophageal reflux disease)    "when I was younger; gone now" (09/21/2017)   IV drug abuse ([place]) 09/20/2017   Migraine    "q couple weeks" (09/21/2017)   Opioid abuse ([place])    Overdose    Stroke [facility])     Past Surgical History:  Procedure Laterality Date   APPENDECTOMY  04/2013   I & D EXTREMITY Left 09/20/2017   Procedure: IRRIGATION AND DEBRIDEMENT LEFT ARM;  Surgeon: [name], MD;  Location: [facility];  Service: Orthopedics;  Laterality: Left;   TEE WITHOUT CARDIOVERSION N/A 02/10/2019   Procedure: TRANSESOPHAGEAL ECHOCARDIOGRAM (TEE);  Surgeon: [name], MD;  Location: [facility];  Service: Cardiovascular;  Laterality: N/A;  Polysubstance abuser   TONGUE SURGERY  ~ 2007   "related to lisp"   TRACHEOSTOMY  04/2016   [name] 05/01/2016    There were no vitals filed for this visit.   Subjective Assessment  - 08/23/20 1748     Subjective  My Botox is wearing off    Pertinent History Per chart. Pt. is a 25 y.o.female with a history of IV drug use, polysubstance as well as tobacco abuse, anxiety. Pt. presented to [facility] on 02/07/2019 after being found unresponsive.  EMS contacted patient received Narcan with some improvement in mental status.  Admission labs with WBC 20,700, potassium 5.2, BUN 25, creatinine 0.67, urine drug screen positive cocaine, marijuana and benzodiazepines, alcohol level less than 10, SARS coronavirus negative.  Cranial CT scan showed areas of indistinct low-density in the right basal ganglia with mass-effect on the right lateral ventricle.  Patient did not receive TPA.  MRI showed acute infarction of right MCA territory, affecting the basal ganglia and affecting the cortical and subcortical brain in a largely watershed distribution. Pt. had a previous inpatient rehab admission 05/01/2016 to 05/09/2016 for hypoxic encephalopathy after drug overdose requiring tracheostomy, and was later decannulated. Pt. resides with her boyfriend in an apartment. 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    Currently in Pain? No/denies    Pain Score 0-No pain                          OT Treatments/Exercises (OP) -  08/23/20 0001       ADLs   Eating Demonstrated rocker knife.  Patient able to use effectively.  She took photo of knife to be able to order for herself.    UB Dressing Patient reports being able to hook her bra in front and flipping around to the back    LB Dressing Patient able to tie shoes using bilateral adapted method with increased time.    ADL Comments Short term goals met.      Splinting   Splinting Pt brought in pre-fabricated soft resting hand splint.  Patient has not worn in over a year per her report.  Patient is not able to don without assistance.  Splint still fits patient, and in good condition.  Encouraged patient to wear not while  sleeping, but for ~ 2 hours during TV time at night to see if more prolonged stretch is helpful to her left hand.  Patient feels she did not tolerate splint all night, and it impacted her rest.  Discussed that custom resting hand splint may be heavier then current splint, and overall position would still allow forslight curvature in hand/fingers - as functional position.  Patient issued [place] soft thumb brace to help promote space between thumb and index to allow her to pinch to retrieve and release small items.  Patient has difficulty moving thumb in isolation of hand consistently.  Brace was helpful today, and patient will practice at home.  Cueing needed with grasp, pinch, release exercise to reduce tension and muscle activation in trunk, right hand for effective use of LUE.                      OT Short Term Goals - 08/23/20 1748       OT SHORT TERM GOAL #1   Title Pt will be independent with HEP    Time 4    Period Weeks    Status Achieved    Target Date 08/21/20      OT SHORT TERM GOAL #2   Title Pt will demonstrate tying bilateral shoes with one handed technique, bilateral hands or with adapted equipment PRN.    Time 4    Period Weeks    Status Achieved      OT SHORT TERM GOAL #3   Title Pt will perform Box and Blocks with LUE with score of 6 or greater with divider for increase in functional use of LUE.    Baseline LUE 4 (without divider) RUE 49.  08/23/20 - 8 BLOCKS    Time 4    Period Weeks    Status Achieved   08/14/20 - practiced grasp/release of blocks     OT SHORT TERM GOAL #4   Title Pt will verbalize understanding of adapted strategies and/or equipment PRN for increased safety and independence with ADLs and IADLs (cutting up vegetables and food, tying shoes, clasping bra, etc)    Time 4    Period Weeks    Status Achieved               OT Long Term Goals - 08/23/20 1853       OT LONG TERM GOAL #1   Title Pt will be independent with updated HEP     Time 12    Period Weeks    Status On-going      OT LONG TERM GOAL #2   Title Pt will demonstrate increased active shoulder flexion to 130 degrees with min compensations  for preparing for reaching to comb hair and assistance styling, etc.    Baseline 130 PROM 110 AROM    Time 12    Period Weeks    Status On-going      OT LONG TERM GOAL #3   Title Pt will perform Box and Blocks with score of 10 or greater with LUE with divider in order to increase functional use and grasp/release with LUE.    Baseline 4 L (without divider) 49 R    Time 12    Period Weeks    Status On-going      OT LONG TERM GOAL #4   Title Pt will increase grip strength in LUE to 15 lbs or greater for increasing functional use of LUE    Baseline L 8.8 R 57.9    Time 12    Period Weeks    Status On-going      OT LONG TERM GOAL #5   Title Pt will demonstrate ability to style hair with adapted strateiges and/or equipment PRN.    Time 12    Period Weeks    Status On-going   08/14/20 - reports completing this w/ Mod I at home     OT LONG TERM GOAL #6   Title Pt will be independent with any wear and care of splints and/or orthoses PRN    Time 12    Period Weeks    Status On-going                   Plan - 08/23/20 1849     Clinical Impression Statement Patient feels Botox benefit is lessening, and is due for another injection in August.  Patient wants continued focus on fine motor skills with LUE.  Patient has met all short term goals    OT Occupational Profile and History Detailed Assessment- Review of Records and additional review of physical, cognitive, psychosocial history related to current functional performance    Occupational performance deficits (Please refer to evaluation for details): ADL's;IADL's    Body Structure / Function / Physical Skills ADL;IADL;Coordination;Endurance;UE functional use;Decreased knowledge of precautions;Dexterity;FMC;ROM;Tone;Strength;GMC;Decreased knowledge of use of DME     Rehab Potential Good    Clinical Decision Making Several treatment options, min-mod task modification necessary    Comorbidities Affecting Occupational Performance: May have comorbidities impacting occupational performance    Modification or Assistance to Complete Evaluation  Min-Moderate modification of tasks or assist with assess necessary to complete eval    OT Frequency 1x / week    OT Duration 12 weeks   1x/week for 12 weeks   OT Treatment/Interventions Self-care/ADL training;DME and/or AE instruction;Energy conservation;Therapeutic activities;Patient/family education;Therapeutic exercise;Neuromuscular education;Aquatic Therapy;Moist Heat;Electrical Stimulation;Ultrasound;Paraffin;Functional Mobility Training;Traction;Splinting;Fluidtherapy;Cognitive remediation/compensation;Passive range of motion;Manual Therapy    Plan Continue to assess splint needs - has resting hand splint (prefab) fits OK -  She was supposed to wear it 2 hrs/night before bed to see if it helped loosen hand.  review HEP for LUE,  hand functioning, work toward LTG    Consulted and Agree with Plan of Care Patient             Patient will benefit from skilled therapeutic intervention in order to improve the following deficits and impairments:   Body Structure / Function / Physical Skills: ADL, IADL, Coordination, Endurance, UE functional use, Decreased knowledge of precautions, Dexterity, FMC, ROM, Tone, Strength, GMC, Decreased knowledge of use of DME       Visit Diagnosis: Spastic hemiplegia of left dominant side  as late effect of cerebral infarction [facility])  Other lack of coordination  Muscle weakness (generalized)  Unsteadiness on feet    Problem List Patient Active Problem List   Diagnosis Date Noted   Tracheostomy status ([place]) 07/05/2019   Major depressive disorder, recurrent episode, severe ([place]) 06/21/2019   Postcoital UTI 05/24/2019   Spastic hemiparesis ([facility])    Vascular headache    Mood  disorder in conditions classified elsewhere    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   Polysubstance dependence in early, early partial, sustained full, or sustained partial remission ([place])    Dyslipidemia    Ischemic stroke diagnosed during current admission ([place]) 02/13/2019   MDD (major depressive disorder), recurrent episode, moderate ([place]) 02/10/2019   CVA (cerebral vascular accident) ([place]) 02/07/2019   TBI (traumatic brain injury) ([place]) 05/01/2016   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

## 2020-08-23 NOTE — Telephone Encounter (Signed)
I apologize for the late response, I was on vacation. And I think it should go under DME? Let me know if that works! Thanks so much.

## 2020-08-27 ENCOUNTER — Encounter: Payer: Self-pay | Admitting: Physical Therapy

## 2020-08-27 ENCOUNTER — Ambulatory Visit: Payer: 59 | Admitting: Physical Therapy

## 2020-08-27 ENCOUNTER — Ambulatory Visit: Payer: 59 | Admitting: Occupational Therapy

## 2020-08-27 ENCOUNTER — Other Ambulatory Visit: Payer: Self-pay

## 2020-08-27 DIAGNOSIS — M6281 Muscle weakness (generalized): Secondary | ICD-10-CM | POA: Diagnosis not present

## 2020-08-27 DIAGNOSIS — R2681 Unsteadiness on feet: Secondary | ICD-10-CM

## 2020-08-27 DIAGNOSIS — R2689 Other abnormalities of gait and mobility: Secondary | ICD-10-CM

## 2020-08-27 NOTE — Therapy (Signed)
[facility] [address] [place], [place], [address] Phone: [phone]   Fax:  [phone]  Physical Therapy Treatment  Patient Details  Name: Jane Watkins MRN: [id number] Date of Birth: 21-Apr-1995 Referring Provider (PT): Carlis Abbott Drema Pry, MD   Encounter Date: 08/27/2020   PT End of Session - 08/27/20 1621     Visit Number 6    Number of Visits 13    Date for PT Re-Evaluation 10/22/20    Authorization Type UHC, 60 visit limit per discipline    Authorization - Visit Number 6    Authorization - Number of Visits 60    PT Start Time 1535   pt in restroom at start of session   PT Stop Time 1615    PT Time Calculation (min) 40 min    Activity Tolerance Patient tolerated treatment well    Behavior During Therapy [facility] for tasks assessed/performed             Past Medical History:  Diagnosis Date   AKI (acute kidney injury) ([facility]) 2018   "from overdose"   Anxiety    Chlamydia    Daily headache    Depression    Drug overdose 04/2016   [name] 05/01/2016   GERD (gastroesophageal reflux disease)    "when I was younger; gone now" (09/21/2017)   IV drug abuse ([facility]) 09/20/2017   Migraine    "q couple weeks" (09/21/2017)   Opioid abuse ([facility])    Overdose    Stroke [facility])     Past Surgical History:  Procedure Laterality Date   APPENDECTOMY  04/2013   I & D EXTREMITY Left 09/20/2017   Procedure: IRRIGATION AND DEBRIDEMENT LEFT ARM;  Surgeon: Bradly Bienenstock, MD;  [facility];  Service: Orthopedics;  Laterality: Left;   TEE WITHOUT CARDIOVERSION N/A 02/10/2019   Procedure: TRANSESOPHAGEAL ECHOCARDIOGRAM (TEE);  Surgeon: Debbe Odea, MD;  Location: [facility];  Service: Cardiovascular;  Laterality: N/A;  Polysubstance abuser   TONGUE SURGERY  ~ 2007   "related to lisp"   TRACHEOSTOMY  04/2016   [name] 05/01/2016    There were no vitals filed for this visit.   Subjective Assessment - 08/27/20 1536      Subjective No falls. No changes since she was last here    Patient is accompained by: --   significant other   Pertinent History PMH: R MCA CVA (01/2019) , IV drug abuse, anxiety, depression, migraines, AKI, hypoxic ischemic encephalopathy 2018, opiate overdose 2018, h/o trach 2018, h/o I&D L UE.    Limitations Walking;Lifting;House hold activities;Standing    How long can you walk comfortably? 1 hour    Patient Stated Goals wants to try some electrical stimulation, improve her walking    Currently in Pain? No/denies    Pain Onset More than a month ago                               [facility] Adult PT Treatment/Exercise - 08/27/20 1556       Transfers   Transfers Sit to Stand;Stand to Sit    Comments completed staggered stance sit <> stand with LLE posterior to promote DF x 10 reps, PT stabilizing at ankle to further promote stretch.      Ambulation/Gait   Stairs Yes    Stairs Assistance 5: Supervision;6: Modified independent (Device/Increase time)    Stairs Assistance Details (indicate cue type and reason) pt descending  with step to pattern and stepping down first with stronger RLE, cued pt to try to step down with LLE, however pt with incr inversion tone in LLE and improper positioning, discussed the way that pt is currently performing is the best for her ankle.    Stair Management Technique One rail Right;Alternating pattern;Step to pattern   step to descending   Number of Stairs 4   x4 reps   Height of Stairs 6      Exercises   Exercises Other Exercises    Other Exercises  at stair case, forward lunge with LLE on 2nd step for closed chain ankle DF stretch 10 reps of 5 seconds each      Knee/Hip Exercises: Standing   Lateral Step Up Left;2 sets;10 reps;Hand Hold: 0;Step Height: 6"    Lateral Step Up Limitations 1st rep with tapping RLE to step x10 reps, then 2nd with floating RLE and LLE as stance leg, pt needing UE support    Forward Step Up Left;1 set;10 reps;Hand  Hold: 1;Step Height: 6"    Forward Step Up Limitations LLE on 1st step, shifting weight forwards and tapping RLE to 12" step, cues for full weight shift over LLE             Access Code: [email or web address] URL: [email or web address] Date: 08/27/2020 Prepared by: Sherlie Ban  Exercises Supine Ankle Dorsiflexion Stretch with Caregiver - 1 x daily - 7 x weekly - 1 sets - 3 reps - 30 - 60 second hold hold Standing Gastroc Stretch on Step with Counter Support - 1 x daily - 7 x weekly - 3 sets - 3 reps - 30 seconds hold Single Leg Bridge - 1 x daily - 7 x weekly - 2 sets - 10 reps Squatting Lateral Monster Walk and Counter Support - 1 x daily - 7 x weekly - 1 sets - 3 reps  New addition on 08/27/20:  Standing Marching - 2 x daily - 5 x weekly - 3 sets - at [employer] with Band at [employer] and Counter Support - 2 x daily - 5 x weekly - 3 sets - WITHOUT RESISTANCE, cues for technique.     Balance Exercises - 08/27/20 0001       Balance Exercises: Standing   Rockerboard Anterior/posterior;Limitations    Rockerboard Limitations rocking board first with UE support for ankle ROM x10 reps, then with no UE support for balance x10 reps with intermittent UE support for balance, x10 reps head turns, x 10 reps head nods (incr difficulty), x10 reps mini squats - with use of mirror as visual cue for weight shifting and therapist providing tactile cues. keeping LLE as stance leg on board, stepping RLE on and off x12 reps without UE support               PT Education - 08/27/20 1620     Education provided Yes    Education Details contact information for pt to call and make appt for night splint, additions to HEP for balance/strength, putting inr referral for aquatic therapy    Person(s) Educated Patient    Methods Explanation;Demonstration;Verbal cues;Handout    Comprehension Verbalized understanding;Returned demonstration              PT Short Term  Goals - 08/23/20 1731       PT SHORT TERM GOAL #1   Title Pt will be independent with initial HEP in order to indicate improved strength and decreased fall  risk. ALL STGS DUE 08/21/20    Baseline reports independence with HEP, completed 3-4x/week    Time 4    Period Weeks    Status Achieved    Target Date 08/21/20      PT SHORT TERM GOAL #2   Title Pt will undergo further assessment of with LTG written as appropriate in order to demo improved endurance for community mobility.    Baseline 1513 ft    Time 4    Period Weeks    Status Achieved      PT SHORT TERM GOAL #3   Title Pt will improve FGA score to at least a 22/30 in order to demo decr fall risk.    Baseline 20/30; 22/30    Time 4    Period Weeks    Status Achieved               PT Long Term Goals - 08/02/20 1848       PT LONG TERM GOAL #1   Title Pt will be independent with final HEP in order to indicate improved strength and decreased fall risk. ALL LTG DUE 09/18/20    Time 8    Period Weeks    Status New      PT LONG TERM GOAL #2   Title Patient will be able to ambulate >/= 1900 ft with to indicate improved endurance for community mobility and within age related normsl    Baseline 1513 ft    Time 8    Period Weeks    Status Revised      PT LONG TERM GOAL #3   Title Pt will improve FGA to at least 24/30 in order to demo decr fall risk.    Baseline 20/30    Time 8    Period Weeks    Status New      PT LONG TERM GOAL #4   Title Pt will improve gait speed with no AD to at least 3.7 ft/sec for improved community mobility    Baseline 3.3 ft/sec    Time 8    Period Weeks    Status New      PT LONG TERM GOAL #5   Title Pt will ambulate at least 1000' outdoors over grass surfaces with mod I in order to demo improved community mobility.    Time 8    Period Weeks    Status New                   Plan - 08/27/20 1622     Clinical Impression Statement Today's skilled session continued  to focus on closed chain L ankle DF stretching, LLE strengthening and weight bearing, and stair training. With step to pattern while descending pt does better when stepping down first with stronger RLE due to incr tone with LLE when stepping down first leading to improper positioning. Added exercises to HEP for balance/strengthening with pt tolerating well.    Personal Factors and Comorbidities Behavior Pattern;Past/Current Experience;Time since onset of injury/illness/exacerbation    Examination-Activity Limitations Bathing;Transfers;Bend;Carry;Reach Overhead;Stand;Locomotion Level;Stairs;Squat    Examination-Participation Restrictions Yard Work;Cleaning;Laundry;Community Activity;Meal Prep;Occupation    Stability/Clinical Decision Making Stable/Uncomplicated    Rehab Potential Good    PT Frequency 1x / week    PT Duration 12 weeks    PT Treatment/Interventions ADLs/Self Care Home Management;DME Instruction;Gait training;Stair training;Functional mobility training;Therapeutic activities;Therapeutic exercise;Balance training;Neuromuscular re-education;Patient/family education;Vestibular;Energy conservation;Electrical Stimulation;Passive range of motion;Manual techniques;Splinting;Orthotic Fit/Training;Aquatic Therapy    PT Next Visit  Plan did pt get night splint? Weight shifting, lunges. Continue functional strengthening. Manual therapy to LLE. DF ROM with LLE. did pt hear about night splint?    Recommended Other Services PT to put in referral for aquatics.    Consulted and Agree with Plan of Care Patient             Patient will benefit from skilled therapeutic intervention in order to improve the following deficits and impairments:  Decreased activity tolerance, Decreased balance, Decreased endurance, Decreased mobility, Decreased strength, Postural dysfunction, Abnormal gait, Difficulty walking, Impaired tone, Decreased range of motion, Decreased coordination, Impaired UE functional use, Pain,  Impaired flexibility  Visit Diagnosis: Muscle weakness (generalized)  Unsteadiness on feet  Other abnormalities of gait and mobility     Problem List Patient Active Problem List   Diagnosis Date Noted   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   Major depressive disorder, recurrent episode, severe ([facility]CC) 06/21/2019   Postcoital UTI 05/24/2019   Spastic hemiparesis ([facility])    Vascular headache    Mood disorder in conditions classified elsewhere    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   Polysubstance dependence in early, early partial, sustained full, or sustained partial remission ([facility]CC)    Dyslipidemia    Ischemic stroke diagnosed during current admission ([facility]) 02/13/2019   MDD (major depressive disorder), recurrent episode, moderate ([facility]) 02/10/2019   CVA (cerebral vascular accident) ([facility]) 02/07/2019   TBI (traumatic brain injury) ([facility]CC) 05/01/2016   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

## 2020-08-28 ENCOUNTER — Ambulatory Visit: Payer: 59 | Admitting: Occupational Therapy

## 2020-08-28 ENCOUNTER — Encounter: Payer: Self-pay | Admitting: Occupational Therapy

## 2020-08-28 DIAGNOSIS — R2689 Other abnormalities of gait and mobility: Secondary | ICD-10-CM

## 2020-08-28 DIAGNOSIS — R41844 Frontal lobe and executive function deficit: Secondary | ICD-10-CM

## 2020-08-28 DIAGNOSIS — I69352 Hemiplegia and hemiparesis following cerebral infarction affecting left dominant side: Secondary | ICD-10-CM

## 2020-08-28 DIAGNOSIS — R278 Other lack of coordination: Secondary | ICD-10-CM

## 2020-08-28 DIAGNOSIS — R41840 Attention and concentration deficit: Secondary | ICD-10-CM

## 2020-08-28 DIAGNOSIS — M6281 Muscle weakness (generalized): Secondary | ICD-10-CM | POA: Diagnosis not present

## 2020-08-28 DIAGNOSIS — R2681 Unsteadiness on feet: Secondary | ICD-10-CM

## 2020-08-28 NOTE — Therapy (Signed)
[facility] [address] [place], [place], [address] Phone: [phone]   Fax:  [phone]  Occupational Therapy Treatment  Patient Details  Name: Jane Watkins MRN: [id number] Date of Birth: 1996/02/11 Referring Provider (OT): Sula Soda   Encounter Date: 08/28/2020   OT End of Session - 08/28/20 1624     Visit Number 6    Number of Visits 13    Date for OT Re-Evaluation 10/15/20    Authorization Type UHC 2022    Authorization Time Period Eval Date 07/24/2020  VL: 60OT (separate from 60PT)    OT Start Time 1622    OT Stop Time 1700    OT Time Calculation (min) 38 min    Activity Tolerance Patient tolerated treatment well    Behavior During Therapy [facility] for tasks assessed/performed             Past Medical History:  Diagnosis Date   AKI (acute kidney injury) (HCC) 2018   "from overdose"   Anxiety    Chlamydia    Daily headache    Depression    Drug overdose 04/2016   [name] 05/01/2016   GERD (gastroesophageal reflux disease)    "when I was younger; gone now" (09/21/2017)   IV drug abuse (HCC) 09/20/2017   Migraine    "q couple weeks" (09/21/2017)   Opioid abuse (HCC)    Overdose    Stroke [facility])     Past Surgical History:  Procedure Laterality Date   APPENDECTOMY  04/2013   I & D EXTREMITY Left 09/20/2017   Procedure: IRRIGATION AND DEBRIDEMENT LEFT ARM;  Surgeon: Bradly Bienenstock, MD;  Location: [facility] OR;  Service: Orthopedics;  Laterality: Left;   TEE WITHOUT CARDIOVERSION N/A 02/10/2019   Procedure: TRANSESOPHAGEAL ECHOCARDIOGRAM (TEE);  Surgeon: Debbe Odea, MD;  Location: [facility] ORS;  Service: Cardiovascular;  Laterality: N/A;  Polysubstance abuser   TONGUE SURGERY  ~ 2007   "related to lisp"   TRACHEOSTOMY  04/2016   [name] 05/01/2016    There were no vitals filed for this visit.   Subjective Assessment - 08/28/20 1623     Subjective  Pt reports "nothing" new. Denies pain. Pt reports  positioning of the splint made thumb go numb and turn blue.    Pertinent History Per chart. Pt. is a 25 y.o.female with a history of IV drug use, polysubstance as well as tobacco abuse, anxiety. Pt. presented to [facility] on 02/07/2019 after being found unresponsive.  EMS contacted patient received Narcan with some improvement in mental status.  Admission labs with WBC 20,700, potassium 5.2, BUN 25, creatinine 0.67, urine drug screen positive cocaine, marijuana and benzodiazepines, alcohol level less than 10, SARS coronavirus negative.  Cranial CT scan showed areas of indistinct low-density in the right basal ganglia with mass-effect on the right lateral ventricle.  Patient did not receive TPA.  MRI showed acute infarction of right MCA territory, affecting the basal ganglia and affecting the cortical and subcortical brain in a largely watershed distribution. Pt. had a previous inpatient rehab admission 05/01/2016 to 05/09/2016 for hypoxic encephalopathy after drug overdose requiring tracheostomy, and was later decannulated. Pt. resides with her boyfriend in an apartment. Pt. worked full time as a Production designer, theatre/television/film at [employer]. pt. enjoys travelling.    Patient Stated Goals Patient reports she would like to be as independent as possible with all her daily tasks.    Currently in Pain? 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 - pt excited and did not report any concerns with job duties. Pt reports there are chairs for her to sit in if she needs it behind the register      Neurological Re-education Exercises   Other Exercises 1 Pt reports trying to use LUE more often for functional activtiies and carrying items (i.e. keys and phone) with it, turning off lights, opening doors and drawers, etc.    Other Exercises 2 Functional grasp and release  with cylindrical pegs and checker chips and with flipping jumbo cards with LUE      Splinting   Splinting Pt reports that prefab splint issued previously was causing her thumb to go numb and turn blue after approx 30 minutes of wearing splint on LUE. OT issued blue foam for comfort for splint for thumb to see if that will help with comfort and encourged patient to try again while monitoring for previous signs and to d/c if previous signs arise again. Pt verbalized understanding.                      OT Short Term Goals - 08/23/20 1748       OT SHORT TERM GOAL #1   Title Pt will be independent with HEP    Time 4    Period Weeks    Status Achieved    Target Date 08/21/20      OT SHORT TERM GOAL #2   Title Pt will demonstrate tying bilateral shoes with one handed technique, bilateral hands or with adapted equipment PRN.    Time 4    Period Weeks    Status Achieved      OT SHORT TERM GOAL #3   Title Pt will perform Box and Blocks with LUE with score of 6 or greater with divider for increase in functional use of LUE.    Baseline LUE 4 (without divider) RUE 49.  08/23/20 - 8 BLOCKS    Time 4    Period Weeks    Status Achieved   08/14/20 - practiced grasp/release of blocks     OT SHORT TERM GOAL #4   Title Pt will verbalize understanding of adapted strategies and/or equipment PRN for increased safety and independence with ADLs and IADLs (cutting up vegetables and food, tying shoes, clasping bra, etc)    Time 4    Period Weeks    Status Achieved               OT Long Term Goals - 08/23/20 1853       OT LONG TERM GOAL #1   Title Pt will be independent with updated HEP    Time 12    Period Weeks    Status On-going      OT LONG TERM GOAL #2   Title Pt will demonstrate increased active shoulder flexion to 130 degrees with min compensations for preparing for reaching to comb hair and assistance styling, etc.    Baseline 130 PROM 110 AROM    Time 12    Period Weeks     Status On-going      OT LONG TERM GOAL #3   Title Pt will perform Box and Blocks with score of 10 or greater with LUE with divider in order  to increase functional use and grasp/release with LUE.    Baseline 4 L (without divider) 49 R    Time 12    Period Weeks    Status On-going      OT LONG TERM GOAL #4   Title Pt will increase grip strength in LUE to 15 lbs or greater for increasing functional use of LUE    Baseline L 8.8 R 57.9    Time 12    Period Weeks    Status On-going      OT LONG TERM GOAL #5   Title Pt will demonstrate ability to style hair with adapted strateiges and/or equipment PRN.    Time 12    Period Weeks    Status On-going   08/14/20 - reports completing this w/ Mod I at home     OT LONG TERM GOAL #6   Title Pt will be independent with any wear and care of splints and/or orthoses PRN    Time 12    Period Weeks    Status On-going                   Plan - 08/28/20 1704     Clinical Impression Statement Pt continues to progress with functional use of LUE.    OT Occupational Profile and History Detailed Assessment- Review of Records and additional review of physical, cognitive, psychosocial history related to current functional performance    Occupational performance deficits (Please refer to evaluation for details): ADL's;IADL's    Body Structure / Function / Physical Skills ADL;IADL;Coordination;Endurance;UE functional use;Decreased knowledge of precautions;Dexterity;FMC;ROM;Tone;Strength;GMC;Decreased knowledge of use of DME    Rehab Potential Good    Clinical Decision Making Several treatment options, min-mod task modification necessary    Comorbidities Affecting Occupational Performance: May have comorbidities impacting occupational performance    Modification or Assistance to Complete Evaluation  Min-Moderate modification of tasks or assist with assess necessary to complete eval    OT Frequency 1x / week    OT Duration 12 weeks   1x/week for 12  weeks   OT Treatment/Interventions Self-care/ADL training;DME and/or AE instruction;Energy conservation;Therapeutic activities;Patient/family education;Therapeutic exercise;Neuromuscular education;Aquatic Therapy;Moist Heat;Electrical Stimulation;Ultrasound;Paraffin;Functional Mobility Training;Traction;Splinting;Fluidtherapy;Cognitive remediation/compensation;Passive range of motion;Manual Therapy    Plan has resting hand splint (prefab) fits OK -  She was supposed to wear it 2 hrs/night before bed to see if it helped loosen hand.  review HEP for LUE,  hand functioning, work toward LTG    Consulted and Agree with Plan of Care Patient             Patient will benefit from skilled therapeutic intervention in order to improve the following deficits and impairments:   Body Structure / Function / Physical Skills: ADL, IADL, Coordination, Endurance, UE functional use, Decreased knowledge of precautions, Dexterity, FMC, ROM, Tone, Strength, GMC, Decreased knowledge of use of DME       Visit Diagnosis: Muscle weakness (generalized)  Other abnormalities of gait and mobility  Spastic hemiplegia of left dominant side as late effect of cerebral infarction (HCC)  Unsteadiness on feet  Other lack of coordination  Frontal lobe and executive function deficit  Attention and concentration deficit    Problem List Patient Active Problem List   Diagnosis Date Noted   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   Major depressive disorder, recurrent episode, severe (HCC) 06/21/2019   Postcoital UTI 05/24/2019   Spastic hemiparesis (HCC)    Vascular headache    Mood disorder in conditions classified elsewhere  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 [place], [place], [address] Phone: [phone]   Fax:  [phone]  Name: Jane Watkins MRN: [id number] Date of Birth: 01-Jul-1995

## 2020-09-03 ENCOUNTER — Encounter: Payer: Self-pay | Admitting: Physical Therapy

## 2020-09-03 ENCOUNTER — Encounter: Payer: Self-pay | Admitting: Occupational Therapy

## 2020-09-03 ENCOUNTER — Ambulatory Visit: Payer: 59 | Admitting: Physical Therapy

## 2020-09-03 ENCOUNTER — Other Ambulatory Visit: Payer: Self-pay

## 2020-09-03 ENCOUNTER — Ambulatory Visit: Payer: 59 | Admitting: Occupational Therapy

## 2020-09-03 DIAGNOSIS — R41844 Frontal lobe and executive function deficit: Secondary | ICD-10-CM

## 2020-09-03 DIAGNOSIS — M6281 Muscle weakness (generalized): Secondary | ICD-10-CM

## 2020-09-03 DIAGNOSIS — I69352 Hemiplegia and hemiparesis following cerebral infarction affecting left dominant side: Secondary | ICD-10-CM

## 2020-09-03 DIAGNOSIS — R2689 Other abnormalities of gait and mobility: Secondary | ICD-10-CM

## 2020-09-03 DIAGNOSIS — R278 Other lack of coordination: Secondary | ICD-10-CM

## 2020-09-03 DIAGNOSIS — R2681 Unsteadiness on feet: Secondary | ICD-10-CM

## 2020-09-03 DIAGNOSIS — R41840 Attention and concentration deficit: Secondary | ICD-10-CM

## 2020-09-03 NOTE — Therapy (Signed)
[facility] [address] [place], [place], [address] Phone: [phone]   Fax:  [phone]  Physical Therapy Treatment  Patient Details  Name: Jane Watkins MRN: [id number] Date of Birth: 06-17-1995 Referring Provider (PT): Carlis Abbott Drema Pry, MD   Encounter Date: 09/03/2020   PT End of Session - 09/03/20 1633     Visit Number 7    Number of Visits 13    Date for PT Re-Evaluation 10/22/20    Authorization Type UHC, 60 visit limit per discipline    Authorization - Visit Number 7    Authorization - Number of Visits 60    PT Start Time 1456   pt late to session   PT Stop Time 1530    PT Time Calculation (min) 34 min    Activity Tolerance Patient tolerated treatment well    Behavior During Therapy [facility] for tasks assessed/performed             Past Medical History:  Diagnosis Date   AKI (acute kidney injury) (HCC) 2018   "from overdose"   Anxiety    Chlamydia    Daily headache    Depression    Drug overdose 04/2016   [name] 05/01/2016   GERD (gastroesophageal reflux disease)    "when I was younger; gone now" (09/21/2017)   IV drug abuse (HCC) 09/20/2017   Migraine    "q couple weeks" (09/21/2017)   Opioid abuse (HCC)    Overdose    Stroke [facility])     Past Surgical History:  Procedure Laterality Date   APPENDECTOMY  04/2013   I & D EXTREMITY Left 09/20/2017   Procedure: IRRIGATION AND DEBRIDEMENT LEFT ARM;  Surgeon: Bradly Bienenstock, MD;  Location: [facility] OR;  Service: Orthopedics;  Laterality: Left;   TEE WITHOUT CARDIOVERSION N/A 02/10/2019   Procedure: TRANSESOPHAGEAL ECHOCARDIOGRAM (TEE);  Surgeon: Debbe Odea, MD;  Location: [facility] ORS;  Service: Cardiovascular;  Laterality: N/A;  Polysubstance abuser   TONGUE SURGERY  ~ 2007   "related to lisp"   TRACHEOSTOMY  04/2016   [name] 05/01/2016    There were no vitals filed for this visit.   Subjective Assessment - 09/03/20 1457     Subjective Has an appt  with [facility] on August 10th to receive her night splint. New exercises went well. Sees Ihor Austin this thursday.    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    Limitations Walking;Lifting;House hold activities;Standing    How long can you walk comfortably? 1 hour    Patient Stated Goals wants to try some electrical stimulation, improve her walking    Currently in Pain? No/denies    Pain Onset More than a month ago                               [facility] Adult PT Treatment/Exercise - 09/03/20 1500       Neuro Re-ed    Neuro Re-ed Details  with UE support on red ball: x10 reps tall knee mini squats, x10 reps with bias to LLE, x10 reps RLE hip ABD (stepping leg out and in) with manual and verbal cues for technique and to prevent compensatory trunk lean with LLE, with RUE support on kaye bench and LLE  forwards in half kneeling - weight shifting forwards x10 reps with therapist providing additional overpressure at ankle for stretch into DF, then reaching with RUE across body and tapping targets (laterally and anteriorly) x10 reps, with LLE posteriorly and no UE support weight shifting posteriorly and then back to midline x10 reps      Exercises   Exercises Other Exercises    Other Exercises  supine ankle DF stretch to LLE 3 x 30 second holds      Knee/Hip Exercises: Stretches   Passive Hamstring Stretch Left;2 reps;30 seconds    Passive Hamstring Stretch Limitations seated at edge of mat table - performed 3 x 30 seconds with verbal and demo cues for technique, added to pt's HEP      Knee/Hip Exercises: Aerobic   Other Aerobic Completed SciFit with BLE only. completed x6 minutes on Level 2.8 - cues to keep L ankle on peddle for additional stretch, with use of band around thighs to  keep hips neutral      Knee/Hip Exercises: Supine   Bridges Strengthening;AROM;Left;1 set;10 reps    Bridges Limitations x10 reps lifting into bridge and then raising up RLE for single leg bridge on LLE, therapist helping to steady LLE      Knee/Hip Exercises: Sidelying   Clams with LLE 2 x 10 reps, cues for proper positioning and alignment                    PT Education - 09/03/20 1531     Education Details performing hamstring stretch for home    Person(s) Educated Patient    Methods Explanation;Demonstration    Comprehension Returned demonstration;Verbalized understanding              PT Short Term Goals - 08/23/20 1731       PT SHORT TERM GOAL #1   Title Pt will be independent with initial HEP in order to indicate improved strength and decreased fall risk. ALL STGS DUE 08/21/20    Baseline reports independence with HEP, completed 3-4x/week    Time 4    Period Weeks    Status Achieved    Target Date 08/21/20      PT SHORT TERM GOAL #2   Title Pt will undergo further assessment of 6MWT with LTG written as appropriate in order to demo improved endurance for community mobility.    Baseline 1513 ft    Time 4    Period Weeks    Status Achieved      PT SHORT TERM GOAL #3   Title Pt will improve FGA score to at least a 22/30 in order to demo decr fall risk.    Baseline 20/30; 22/30    Time 4    Period Weeks    Status Achieved               PT Long Term Goals - 08/02/20 1848       PT LONG TERM GOAL #1   Title Pt will be independent with final HEP in order to indicate improved strength and decreased fall risk. ALL LTG DUE 09/18/20    Time 8    Period Weeks    Status New      PT LONG TERM GOAL #2   Title Patient will be able to ambulate >/= 1900 ft with 6MWT to indicate improved endurance for community mobility and within age related normsl    Baseline 1513 ft    Time 8  Period Weeks    Status Revised      PT LONG TERM GOAL #3   Title Pt  will improve FGA to at least 24/30 in order to demo decr fall risk.    Baseline 20/30    Time 8    Period Weeks    Status New      PT LONG TERM GOAL #4   Title Pt will improve gait speed with no AD to at least 3.7 ft/sec for improved community mobility    Baseline 3.3 ft/sec    Time 8    Period Weeks    Status New      PT LONG TERM GOAL #5   Title Pt will ambulate at least 1000' outdoors over grass surfaces with mod I in order to demo improved community mobility.    Time 8    Period Weeks    Status New                   Plan - 09/03/20 1638     Clinical Impression Statement Today's skilled session focused on closed and open chain L ankle DF stretching, LLE strengthening, and weight bearing through LLE in tall kneel and half kneel postions. Pt tolerated session well today. Pt has an appointment in August with [facility] to receive her night splint for LLE.    Personal Factors and Comorbidities Behavior Pattern;Past/Current Experience;Time since onset of injury/illness/exacerbation    Examination-Activity Limitations Bathing;Transfers;Bend;Carry;Reach Overhead;Stand;Locomotion Level;Stairs;Squat    Examination-Participation Restrictions Yard Work;Cleaning;Laundry;Community Activity;Meal Prep;Occupation    Stability/Clinical Decision Making Stable/Uncomplicated    Rehab Potential Good    PT Frequency 1x / week    PT Duration 12 weeks    PT Treatment/Interventions ADLs/Self Care Home Management;DME Instruction;Gait training;Stair training;Functional mobility training;Therapeutic activities;Therapeutic exercise;Balance training;Neuromuscular re-education;Patient/family education;Vestibular;Energy conservation;Electrical Stimulation;Passive range of motion;Manual techniques;Splinting;Orthotic Fit/Training;Aquatic Therapy    PT Next Visit Plan Weight shifting, lunges. Continue functional strengthening. Manual therapy to LLE. DF ROM with LLE. half and tall kneeling activities.     Consulted and Agree with Plan of Care Patient             Patient will benefit from skilled therapeutic intervention in order to improve the following deficits and impairments:  Decreased activity tolerance, Decreased balance, Decreased endurance, Decreased mobility, Decreased strength, Postural dysfunction, Abnormal gait, Difficulty walking, Impaired tone, Decreased range of motion, Decreased coordination, Impaired UE functional use, Pain, Impaired flexibility  Visit Diagnosis: Muscle weakness (generalized)  Other abnormalities of gait and mobility  Unsteadiness on feet     Problem List Patient Active Problem List   Diagnosis Date Noted   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   Major depressive disorder, recurrent episode, severe (HCC) 06/21/2019   Postcoital UTI 05/24/2019   Spastic hemiparesis (HCC)    Vascular headache    Mood disorder in conditions classified elsewhere    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   Polysubstance dependence in early, early partial, sustained full, or sustained partial remission (HCC)    Dyslipidemia    Ischemic stroke diagnosed during current admission (HCC) 02/13/2019   MDD (major depressive disorder), recurrent episode, moderate (HCC) 02/10/2019   CVA (cerebral vascular accident) (HCC) 02/07/2019   TBI (traumatic brain injury) (HCC) 05/01/2016   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

## 2020-09-03 NOTE — Therapy (Signed)
[facility] [address] [place], [place], [address] Phone: [phone]   Fax:  [phone]  Occupational Therapy Treatment  Patient Details  Name: Jane Watkins MRN: [id number] Date of Birth: 06-09-1995 Referring Provider (OT): Sula Soda   Encounter Date: 09/03/2020   OT End of Session - 09/03/20 1534     Visit Number 7    Number of Visits 13    Date for OT Re-Evaluation 10/15/20    Authorization Type UHC 2022    Authorization Time Period Eval Date 07/24/2020  VL: 60OT (separate from 60PT)    OT Start Time 1531    OT Stop Time 1615    OT Time Calculation (min) 44 min    Activity Tolerance Patient tolerated treatment well    Behavior During Therapy [facility] for tasks assessed/performed             Past Medical History:  Diagnosis Date   AKI (acute kidney injury) (HCC) 2018   "from overdose"   Anxiety    Chlamydia    Daily headache    Depression    Drug overdose 04/2016   [name] 05/01/2016   GERD (gastroesophageal reflux disease)    "when I was younger; gone now" (09/21/2017)   IV drug abuse (HCC) 09/20/2017   Migraine    "q couple weeks" (09/21/2017)   Opioid abuse (HCC)    Overdose    Stroke [facility])     Past Surgical History:  Procedure Laterality Date   APPENDECTOMY  04/2013   I & D EXTREMITY Left 09/20/2017   Procedure: IRRIGATION AND DEBRIDEMENT LEFT ARM;  Surgeon: Bradly Bienenstock, MD;  Location: [facility] OR;  Service: Orthopedics;  Laterality: Left;   TEE WITHOUT CARDIOVERSION N/A 02/10/2019   Procedure: TRANSESOPHAGEAL ECHOCARDIOGRAM (TEE);  Surgeon: Debbe Odea, MD;  Location: [facility] ORS;  Service: Cardiovascular;  Laterality: N/A;  Polysubstance abuser   TONGUE SURGERY  ~ 2007   "related to lisp"   TRACHEOSTOMY  04/2016   [name] 05/01/2016    There were no vitals filed for this visit.   Subjective Assessment - 09/03/20 1532     Subjective  Pt reports pad helped with the splint and thumb.     Pertinent History Per chart. Pt. is a 25 y.o.female with a history of IV drug use, polysubstance as well as tobacco abuse, anxiety. Pt. presented to [facility] on 02/07/2019 after being found unresponsive.  EMS contacted patient received Narcan with some improvement in mental status.  Admission labs with WBC 20,700, potassium 5.2, BUN 25, creatinine 0.67, urine drug screen positive cocaine, marijuana and benzodiazepines, alcohol level less than 10, SARS coronavirus negative.  Cranial CT scan showed areas of indistinct low-density in the right basal ganglia with mass-effect on the right lateral ventricle.  Patient did not receive TPA.  MRI showed acute infarction of right MCA territory, affecting the basal ganglia and affecting the cortical and subcortical brain in a largely watershed distribution. Pt. had a previous inpatient rehab admission 05/01/2016 to 05/09/2016 for hypoxic encephalopathy after drug overdose requiring tracheostomy, and was later decannulated. Pt. resides with her boyfriend in an apartment. Pt. worked full time as a Production designer, theatre/television/film at [employer]. pt. enjoys travelling.    Patient Stated Goals Patient reports she would like to be as independent as possible with all her daily tasks.    Currently in Pain? No/denies  OT Treatments/Exercises (OP) - 09/03/20 1546       Neurological Re-education Exercises   Thumb Opposition palmar abudction with LUE with use of bottle cap for increase in overall function of LUE and active use of thumb for grasping objects. Pt with limited AROM with palmar abduction noted today.    Other Exercises 1 Table slides x 10 reps, self PROM for LUE hand and digitis for extension on table    Other Exercises 2 pt with good demonstration of weight bearing onto table and streching LUE    Other Grasp and Release Exercises  manipulation of connect four chips and placing into medium cone with LUE for increase in grasp and release and coordidnation of  LUE and development of thumb abduction                      OT Short Term Goals - 08/23/20 1748       OT SHORT TERM GOAL #1   Title Pt will be independent with HEP    Time 4    Period Weeks    Status Achieved    Target Date 08/21/20      OT SHORT TERM GOAL #2   Title Pt will demonstrate tying bilateral shoes with one handed technique, bilateral hands or with adapted equipment PRN.    Time 4    Period Weeks    Status Achieved      OT SHORT TERM GOAL #3   Title Pt will perform Box and Blocks with LUE with score of 6 or greater with divider for increase in functional use of LUE.    Baseline LUE 4 (without divider) RUE 49.  08/23/20 - 8 BLOCKS    Time 4    Period Weeks    Status Achieved   08/14/20 - practiced grasp/release of blocks     OT SHORT TERM GOAL #4   Title Pt will verbalize understanding of adapted strategies and/or equipment PRN for increased safety and independence with ADLs and IADLs (cutting up vegetables and food, tying shoes, clasping bra, etc)    Time 4    Period Weeks    Status Achieved               OT Long Term Goals - 08/23/20 1853       OT LONG TERM GOAL #1   Title Pt will be independent with updated HEP    Time 12    Period Weeks    Status On-going      OT LONG TERM GOAL #2   Title Pt will demonstrate increased active shoulder flexion to 130 degrees with min compensations for preparing for reaching to comb hair and assistance styling, etc.    Baseline 130 PROM 110 AROM    Time 12    Period Weeks    Status On-going      OT LONG TERM GOAL #3   Title Pt will perform Box and Blocks with score of 10 or greater with LUE with divider in order to increase functional use and grasp/release with LUE.    Baseline 4 L (without divider) 49 R    Time 12    Period Weeks    Status On-going      OT LONG TERM GOAL #4   Title Pt will increase grip strength in LUE to 15 lbs or greater for increasing functional use of LUE    Baseline L 8.8 R  57.9    Time 12  Period Weeks    Status On-going      OT LONG TERM GOAL #5   Title Pt will demonstrate ability to style hair with adapted strateiges and/or equipment PRN.    Time 12    Period Weeks    Status On-going   08/14/20 - reports completing this w/ Mod I at home     OT LONG TERM GOAL #6   Title Pt will be independent with any wear and care of splints and/or orthoses PRN    Time 12    Period Weeks    Status On-going                   Plan - 09/03/20 1555     Clinical Impression Statement Pt continues to progress towards goals. Pt reports thinks botox is definitely wearing off.    OT Occupational Profile and History Detailed Assessment- Review of Records and additional review of physical, cognitive, psychosocial history related to current functional performance    Occupational performance deficits (Please refer to evaluation for details): ADL's;IADL's    Body Structure / Function / Physical Skills ADL;IADL;Coordination;Endurance;UE functional use;Decreased knowledge of precautions;Dexterity;FMC;ROM;Tone;Strength;GMC;Decreased knowledge of use of DME    Rehab Potential Good    Clinical Decision Making Several treatment options, min-mod task modification necessary    Comorbidities Affecting Occupational Performance: May have comorbidities impacting occupational performance    Modification or Assistance to Complete Evaluation  Min-Moderate modification of tasks or assist with assess necessary to complete eval    OT Frequency 1x / week    OT Duration 12 weeks   1x/week for 12 weeks   OT Treatment/Interventions Self-care/ADL training;DME and/or AE instruction;Energy conservation;Therapeutic activities;Patient/family education;Therapeutic exercise;Neuromuscular education;Aquatic Therapy;Moist Heat;Electrical Stimulation;Ultrasound;Paraffin;Functional Mobility Training;Traction;Splinting;Fluidtherapy;Cognitive remediation/compensation;Passive range of motion;Manual Therapy     Plan has resting hand splint (prefab) fits OK -  She was supposed to wear it 2 hrs/night before bed to see if it helped loosen hand.  hand funcitoning, weight bearing    Consulted and Agree with Plan of Care Patient             Patient will benefit from skilled therapeutic intervention in order to improve the following deficits and impairments:   Body Structure / Function / Physical Skills: ADL, IADL, Coordination, Endurance, UE functional use, Decreased knowledge of precautions, Dexterity, FMC, ROM, Tone, Strength, GMC, Decreased knowledge of use of DME       Visit Diagnosis: Muscle weakness (generalized)  Other abnormalities of gait and mobility  Spastic hemiplegia of left dominant side as late effect of cerebral infarction (HCC)  Unsteadiness on feet  Other lack of coordination  Attention and concentration deficit  Frontal lobe and executive function deficit    Problem List Patient Active Problem List   Diagnosis Date Noted   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   Major depressive disorder, recurrent episode, severe (HCC) 06/21/2019   Postcoital UTI 05/24/2019   Spastic hemiparesis (HCC)    Vascular headache    Mood disorder in conditions classified elsewhere    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   Polysubstance dependence in early, early partial, sustained full, or sustained partial remission (HCC)    Dyslipidemia    Ischemic stroke diagnosed during current admission (HCC) 02/13/2019   MDD (major depressive disorder), recurrent episode, moderate (HCC) 02/10/2019   CVA (cerebral vascular accident) (HCC) 02/07/2019   TBI (traumatic brain injury) (HCC) 05/01/2016   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

## 2020-09-06 ENCOUNTER — Ambulatory Visit: Payer: 59 | Admitting: Adult Health

## 2020-09-10 ENCOUNTER — Encounter: Payer: Self-pay | Admitting: Physical Therapy

## 2020-09-10 ENCOUNTER — Ambulatory Visit: Payer: 59 | Attending: Physical Medicine and Rehabilitation | Admitting: Physical Therapy

## 2020-09-10 ENCOUNTER — Other Ambulatory Visit: Payer: Self-pay

## 2020-09-10 ENCOUNTER — Ambulatory Visit: Payer: 59 | Admitting: Occupational Therapy

## 2020-09-10 DIAGNOSIS — I69352 Hemiplegia and hemiparesis following cerebral infarction affecting left dominant side: Secondary | ICD-10-CM | POA: Diagnosis present

## 2020-09-10 DIAGNOSIS — M6281 Muscle weakness (generalized): Secondary | ICD-10-CM | POA: Diagnosis not present

## 2020-09-10 DIAGNOSIS — R2689 Other abnormalities of gait and mobility: Secondary | ICD-10-CM

## 2020-09-10 DIAGNOSIS — R41844 Frontal lobe and executive function deficit: Secondary | ICD-10-CM | POA: Diagnosis present

## 2020-09-10 DIAGNOSIS — R2681 Unsteadiness on feet: Secondary | ICD-10-CM | POA: Diagnosis present

## 2020-09-10 DIAGNOSIS — R41840 Attention and concentration deficit: Secondary | ICD-10-CM | POA: Insufficient documentation

## 2020-09-10 DIAGNOSIS — R278 Other lack of coordination: Secondary | ICD-10-CM | POA: Diagnosis present

## 2020-09-10 NOTE — Therapy (Addendum)
[facility] [address] [place], [place], [address] Phone: [phone]   Fax:  [phone]  Physical Therapy Treatment  Patient Details  Name: Jane EvangelistLydia Helming MRN: [id number] Date of Birth: 11/02/1995 Referring Provider (PT): Carlis Abbottaulkar, Drema PryKrutika P, MD   Encounter Date: 09/10/2020   PT End of Session - 09/10/20 1529     Visit Number 8    Number of Visits 13    Date for PT Re-Evaluation 10/22/20    Authorization Type UHC, 60 visit limit per discipline    Authorization - Visit Number 8    Authorization - Number of Visits 60    PT Start Time 1448    PT Stop Time 1528    PT Time Calculation (min) 40 min    Activity Tolerance Patient tolerated treatment well    Behavior During Therapy [facility] for tasks assessed/performed             Past Medical History:  Diagnosis Date   AKI (acute kidney injury) (HCC) 2018   "from overdose"   Anxiety    Chlamydia    Daily headache    Depression    Drug overdose 04/2016   [name]/notes 05/01/2016   GERD (gastroesophageal reflux disease)    "when I was younger; gone now" (09/21/2017)   IV drug abuse (HCC) 09/20/2017   Migraine    "q couple weeks" (09/21/2017)   Opioid abuse (HCC)    Overdose    Stroke [facility](HCC)     Past Surgical History:  Procedure Laterality Date   APPENDECTOMY  04/2013   I & D EXTREMITY Left 09/20/2017   Procedure: IRRIGATION AND DEBRIDEMENT LEFT ARM;  Surgeon: Bradly Bienenstockrtmann, Fred, MD;  Location: [facility] OR;  Service: Orthopedics;  Laterality: Left;   TEE WITHOUT CARDIOVERSION N/A 02/10/2019   Procedure: TRANSESOPHAGEAL ECHOCARDIOGRAM (TEE);  Surgeon: Debbe OdeaAgbor-Etang, Brian, MD;  Location: [facility] ORS;  Service: Cardiovascular;  Laterality: N/A;  Polysubstance abuser   TONGUE SURGERY  ~ 2007   "related to lisp"   TRACHEOSTOMY  04/2016   [name]/notes 05/01/2016    There were no vitals filed for this visit.   Subjective Assessment - 09/10/20 1450     Subjective On Friday was trying to throw  something in a dumpster and lost her footing/slipped on grass. Reports B knees are hurting now.    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    Limitations Walking;Lifting;House hold activities;Standing    How long can you walk comfortably? 1 hour    Patient Stated Goals wants to try some electrical stimulation, improve her walking    Currently in Pain? No/denies    Pain Onset More than a month ago                               [facility] - 09/10/20 0001       Exercises   Exercises Other Exercises    Other Exercises  with L ankle on 6" step in lunge position x10 reps lunge forwards for closed chain DF stretch x10 second holds, additional x5 reps with additional overpressure from therapist into DF, then x10 reps with RLE on step and LLE on floor for additional ankle DF and hamstring strech 3 x 20 seconds  Balance Exercises - 09/10/20 1507       Balance Exercises: Standing   Standing Eyes Closed Foam/compliant surface;Limitations    Standing Eyes Closed Limitations tall kneeling on blue air ex, x30 seconds, x10 reps head turns, x10 reps head nods (incr difficulty with nods)    SLS with Vectors Solid surface;Intermittent upper extremity assist;Limitations    SLS with Vectors Limitations standing on level ground with LLE: alternating forward cone taps with RLE x10 reps, forward/cross body cone tap with RLE x10 reps with UE support>none with cues for glute and quad activation with LLE, then R foot on yoga block for modified SLS on LLE, 2 x 5 reps head turns, 2 x 5 reps head nods, min guard for balance    Sidestepping 3 reps;Limitations;Foam/compliant support    Sidestepping Limitations down and back in a mini squat on blue compliant  mat    Marching Intermittent upper extremity assist;Forwards;Retro;Dynamic;Limitations    Marching Limitations forwards and retro down and back x4 reps with cues for slowed and controlled for incr SLS time    Other Standing Exercises on blue mat: forward/retro gait with head turns x2 reps, then with head nods x2 reps, cues for incr step length    Other Standing Exercises Comments standing on blue mat with stance leg as LLE; stepping over yoga block forwards x10 reps and then laterally x10 reps. 2 x 10 reps staggered stance sit to stands with blue air ex with LLE posteriorly               PT Education - 09/10/20 1529     Education Details importance of wearing her ankle brace whenever ambulating on compliant surfaces for incr ankle support as pt does not have her AFO    Person(s) Educated Patient    Methods Explanation    Comprehension Verbalized understanding              PT Short Term Goals - 08/23/20 1731       PT SHORT TERM GOAL #1   Title Pt will be independent with initial HEP in order to indicate improved strength and decreased fall risk. ALL STGS DUE 08/21/20    Baseline reports independence with HEP, completed 3-4x/week    Time 4    Period Weeks    Status Achieved    Target Date 08/21/20      PT SHORT TERM GOAL #2   Title Pt will undergo further assessment of with LTG written as appropriate in order to demo improved endurance for community mobility.    Baseline 1513 ft    Time 4    Period Weeks    Status Achieved      PT SHORT TERM GOAL #3   Title Pt will improve FGA score to at least a 22/30 in order to demo decr fall risk.    Baseline 20/30; 22/30    Time 4    Period Weeks    Status Achieved               PT Long Term Goals - 08/02/20 1848       PT LONG TERM GOAL #1   Title Pt will be independent with final HEP in order to indicate improved strength and decreased fall risk. ALL LTG DUE 09/18/20    Time 8    Period Weeks    Status New       PT LONG TERM GOAL #2   Title Patient will be able to ambulate >/=  1900 ft with to indicate improved endurance for community mobility and within age related normsl    Baseline 1513 ft    Time 8    Period Weeks    Status Revised      PT LONG TERM GOAL #3   Title Pt will improve FGA to at least 24/30 in order to demo decr fall risk.    Baseline 20/30    Time 8    Period Weeks    Status New      PT LONG TERM GOAL #4   Title Pt will improve gait speed with no AD to at least 3.7 ft/sec for improved community mobility    Baseline 3.3 ft/sec    Time 8    Period Weeks    Status New      PT LONG TERM GOAL #5   Title Pt will ambulate at least 1000' outdoors over grass surfaces with mod I in order to demo improved community mobility.    Time 8    Period Weeks    Status New                   Plan - 09/10/20 1931     Clinical Impression Statement Used L ankle ASO today to assist with ankle stability on compliant surfaces and for SLS tasks. Pt has one at home (as she has lost her AFO) and is going to bring it to next session. Focused on closed chain ankle DF stretching with LLE, SLS tasks on LLE, and balance strategies on compliant surfaces.    Personal Factors and Comorbidities Behavior Pattern;Past/Current Experience;Time since onset of injury/illness/exacerbation    Examination-Activity Limitations Bathing;Transfers;Bend;Carry;Reach Overhead;Stand;Locomotion Level;Stairs;Squat    Examination-Participation Restrictions Yard Work;Cleaning;Laundry;Community Activity;Meal Prep;Occupation    Stability/Clinical Decision Making Stable/Uncomplicated    Rehab Potential Good    PT Frequency 1x / week    PT Duration 12 weeks    PT Treatment/Interventions ADLs/Self Care Home Management;DME Instruction;Gait training;Stair training;Functional mobility training;Therapeutic activities;Therapeutic exercise;Balance training;Neuromuscular re-education;Patient/family education;Vestibular;Energy  conservation;Electrical Stimulation;Passive range of motion;Manual techniques;Splinting;Orthotic Fit/Training;Aquatic Therapy    PT Next Visit Plan balance on compliant surfaces and outdoors if weather isn't so hot. use L ASO for ankle stability. Weight shifting, lunges. step ups. DF ROM with LLE. half and tall kneeling activities.    Consulted and Agree with Plan of Care Patient             Patient will benefit from skilled therapeutic intervention in order to improve the following deficits and impairments:  Decreased activity tolerance, Decreased balance, Decreased endurance, Decreased mobility, Decreased strength, Postural dysfunction, Abnormal gait, Difficulty walking, Impaired tone, Decreased range of motion, Decreased coordination, Impaired UE functional use, Pain, Impaired flexibility  Visit Diagnosis: Muscle weakness (generalized)  Other abnormalities of gait and mobility  Unsteadiness on feet     Problem List Patient Active Problem List   Diagnosis Date Noted   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   Major depressive disorder, recurrent episode, severe (HCC) 06/21/2019   Postcoital UTI 05/24/2019   Spastic hemiparesis (HCC)    Vascular headache    Mood disorder in conditions classified elsewhere    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 [place], [place], [address] Phone: [phone]    Fax:  [phone]  Name: Maisha Bogen MRN: [id number] Date of Birth: [birth year]

## 2020-09-17 ENCOUNTER — Encounter: Payer: Self-pay | Admitting: Physical Therapy

## 2020-09-17 ENCOUNTER — Encounter: Payer: Self-pay | Admitting: Occupational Therapy

## 2020-09-17 ENCOUNTER — Ambulatory Visit: Payer: 59 | Admitting: Physical Therapy

## 2020-09-17 ENCOUNTER — Ambulatory Visit: Payer: 59 | Admitting: Occupational Therapy

## 2020-09-17 ENCOUNTER — Other Ambulatory Visit: Payer: Self-pay

## 2020-09-17 DIAGNOSIS — R41840 Attention and concentration deficit: Secondary | ICD-10-CM

## 2020-09-17 DIAGNOSIS — M6281 Muscle weakness (generalized): Secondary | ICD-10-CM | POA: Diagnosis not present

## 2020-09-17 DIAGNOSIS — R2681 Unsteadiness on feet: Secondary | ICD-10-CM

## 2020-09-17 DIAGNOSIS — R2689 Other abnormalities of gait and mobility: Secondary | ICD-10-CM

## 2020-09-17 DIAGNOSIS — R41844 Frontal lobe and executive function deficit: Secondary | ICD-10-CM

## 2020-09-17 DIAGNOSIS — R278 Other lack of coordination: Secondary | ICD-10-CM

## 2020-09-17 DIAGNOSIS — I69352 Hemiplegia and hemiparesis following cerebral infarction affecting left dominant side: Secondary | ICD-10-CM

## 2020-09-17 NOTE — Therapy (Addendum)
[facility] [address] [place], [place], [address] Phone: [phone]   Fax:  [phone]  Physical Therapy Treatment  Patient Details  Name: Jane Watkins MRN: [id number] Date of Birth: 03-Jun-1995 Referring Provider (PT): Ranell Patrick Clide Deutscher, MD   Encounter Date: 09/17/2020   PT End of Session - 09/17/20 1529     Visit Number 9    Number of Visits 13    Date for PT Re-Evaluation 10/22/20    Authorization Type UHC, 62 visit limit per discipline    Authorization - Visit Number 9    Authorization - Number of Visits 8    PT Start Time 1324    PT Stop Time 1527    PT Time Calculation (min) 39 min    Activity Tolerance Patient tolerated treatment well    Behavior During Therapy [facility] for tasks assessed/performed             Past Medical History:  Diagnosis Date   AKI (acute kidney injury) ([place]) 2018   "from overdose"   Anxiety    Chlamydia    Daily headache    Depression    Drug overdose 04/2016   [name] 05/01/2016   GERD (gastroesophageal reflux disease)    "when I was younger; gone now" (09/21/2017)   IV drug abuse ([place]) 09/20/2017   Migraine    "q couple weeks" (09/21/2017)   Opioid abuse ([place])    Overdose    Stroke [facility])     Past Surgical History:  Procedure Laterality Date   APPENDECTOMY  04/2013   I & D EXTREMITY Left 09/20/2017   Procedure: IRRIGATION AND DEBRIDEMENT LEFT ARM;  Surgeon: [name], MD;  Location: [facility];  Service: Orthopedics;  Laterality: Left;   TEE WITHOUT CARDIOVERSION N/A 02/10/2019   Procedure: TRANSESOPHAGEAL ECHOCARDIOGRAM (TEE);  Surgeon: [name], MD;  Location: [facility] ORS;  Service: Cardiovascular;  Laterality: N/A;  Polysubstance abuser   TONGUE SURGERY  ~ 2007   "related to lisp"   TRACHEOSTOMY  04/2016   [name] 05/01/2016    There were no vitals filed for this visit.   Subjective Assessment - 09/17/20 1451     Subjective Forgot her ankle brace.  Reports the ball of her L foot has been hurting (started about 4 days). No falls.    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    Limitations Walking;Lifting;House hold activities;Standing    How long can you walk comfortably? 1 hour    Patient Stated Goals wants to try some electrical stimulation, improve her walking    Currently in Pain? Yes    Pain Score 5     Pain Location Foot   ball of L foot   Pain Orientation Left    Pain Descriptors / Indicators Aching    Pain Type Acute pain    Pain Onset More than a month ago    Aggravating Factors  walking    Pain Relieving Factors nothing                [facility] PT Assessment - 09/17/20 1509       Functional Gait  Assessment   Gait assessed  Yes    Gait Level Surface Walks 20 ft in less than 7 sec but greater than 5.5 sec, uses  assistive device, slower speed, mild gait deviations, or deviates 6-10 in outside of the 12 in walkway width.   5.57 seonds   Change in Gait Speed Able to smoothly change walking speed without loss of balance or gait deviation. Deviate no more than 6 in outside of the 12 in walkway width.    Gait with Horizontal Head Turns Performs head turns smoothly with no change in gait. Deviates no more than 6 in outside 12 in walkway width    Gait with Vertical Head Turns Performs head turns with no change in gait. Deviates no more than 6 in outside 12 in walkway width.    Gait and Pivot Turn Pivot turns safely within 3 sec and stops quickly with no loss of balance.    Step Over Obstacle Is able to step over 2 stacked shoe boxes taped together (9 in total height) without changing gait speed. No evidence of imbalance.    Gait with Narrow Base of Support Is able to ambulate for 10 steps heel to toe with no staggering.    Gait with  Eyes Closed Walks 20 ft, no assistive devices, good speed, no evidence of imbalance, normal gait pattern, deviates no more than 6 in outside 12 in walkway width. Ambulates 20 ft in less than 7 sec.   6.82 seconds   Ambulating Backwards Walks 20 ft, uses assistive device, slower speed, mild gait deviations, deviates 6-10 in outside 12 in walkway width.    Steps Alternating feet, must use rail.    Total Score 27    FGA comment: 27/30                           [facility] Adult PT Treatment/Exercise - 09/17/20 1509       Ambulation/Gait   Ambulation/Gait Yes    Ambulation/Gait Assistance 5: Supervision    Ambulation/Gait Assistance Details throughout session, donned ASO prior to gait for ankle stability (pt didn't bring her brace from home today)   Assistive device None    Gait Pattern Step-through pattern;Decreased stride length;Lateral hip instability;Poor foot clearance - left;Left circumduction;Decreased weight shift to left;Decreased dorsiflexion - left;Decreased stance time - left;Decreased arm swing - right;Decreased arm swing - left    Ambulation Surface Level;Indoor    Gait velocity 11.31 seconds = 2.9 ft/sec      Neuro Re-ed    Neuro Re-ed Details  with RUE support on kaye bench; half kneeling with RLE forwards, tapping cone with RUE (over to L side) x 10 reps, then eyes closed x20 seconds, x5 reps head turns, x5 reps head nods, with LLE forwards, reaching across body with RUE to tap cone on L x8 reps then performed with blue disc under LLE x10 reps,  in tall kneel position: x10 reps hip ABD with RLE for incr stance time on LLE. With blue balance disc under LLE 2 x 10 reps modified SLS bridging with RLE extended      Exercises   Exercises Other Exercises    Other Exercises  supine L ankle DF stretch 3 x 30 seconds, with L ankle on incline, stretching into DF 3 x 30 seconds, cues for technique                    PT Education - 09/17/20 1529     Education  Details progress towards goals    Person(s) Educated Patient    Methods Explanation    Comprehension Verbalized understanding  PT Short Term Goals - 08/23/20 1731       PT SHORT TERM GOAL #1   Title Pt will be independent with initial HEP in order to indicate improved strength and decreased fall risk. ALL STGS DUE 08/21/20    Baseline reports independence with HEP, completed 3-4x/week    Time 4    Period Weeks    Status Achieved    Target Date 08/21/20      PT SHORT TERM GOAL #2   Title Pt will undergo further assessment of 6MWT with LTG written as appropriate in order to demo improved endurance for community mobility.    Baseline 1513 ft    Time 4    Period Weeks    Status Achieved      PT SHORT TERM GOAL #3   Title Pt will improve FGA score to at least a 22/30 in order to demo decr fall risk.    Baseline 20/30; 22/30    Time 4    Period Weeks    Status Achieved               PT Long Term Goals - 09/17/20 1514       PT LONG TERM GOAL #1   Title Pt will be independent with final HEP in order to indicate improved strength and decreased fall risk. ALL LTG DUE 09/18/20    Baseline pt will continue to benefit from ongoing updates/additions    Time 8    Period Weeks    Status On-going      PT LONG TERM GOAL #2   Title Patient will be able to ambulate >/= 1900 ft with 6MWT to indicate improved endurance for community mobility and within age related normsl    Baseline 1513 ft, did not have time to perform on 09/17/20    Time 8    Period Weeks    Status Deferred      PT LONG TERM GOAL #3   Title Pt will improve FGA to at least 24/30 in order to demo decr fall risk.    Baseline 20/30; 27/30 on 09/17/20    Time 8    Period Weeks    Status Achieved      PT LONG TERM GOAL #4   Title Pt will improve gait speed with no AD to at least 3.7 ft/sec for improved community mobility    Baseline 3.3 ft/sec, 2.9 ft/sec  on 09/17/20    Time 8    Period Weeks     Status Not Met      PT LONG TERM GOAL #5   Title Pt will ambulate at least 1000' outdoors over grass surfaces with mod I in order to demo improved community mobility.    Baseline did not have time to perform on 09/17/20    Time 8    Period Weeks    Status Deferred            Ongoing LTGs:     PT Long Term Goals - 09/17/20 1514       PT LONG TERM GOAL #1   Title Pt will be independent with final HEP in order to indicate improved strength and decreased fall risk. ALL LTG DUE 10/15/20    Baseline pt will continue to benefit from ongoing updates/additions    Time 12    Period Weeks    Status On-going    Target Date 10/15/20      PT LONG TERM GOAL #2  Title Patient will be able to ambulate >/= 1900 ft with 6MWT to indicate improved endurance for community mobility and within age related normsl    Baseline 1513 ft, did not have time to perform on 09/17/20    Time 12    Period Weeks    Status On-going      PT LONG TERM GOAL #3   Title --    Baseline --    Time --    Period --    Status --      PT LONG TERM GOAL #4   Title Pt will improve gait speed with no AD to at least 3.6 ft/sec for improved community mobility    Baseline 3.3 ft/sec, 2.9 ft/sec  on 09/17/20    Time 12    Period Weeks    Status Revised      PT LONG TERM GOAL #5   Title Pt will ambulate at least 1000' outdoors over grass surfaces with mod I in order to demo improved community mobility.    Baseline did not have time to perform on 09/17/20    Time 12    Period Weeks    Status On-going                Plan - 09/17/20 1647     Clinical Impression Statement Today's skilled session focused on assessing pt's LTGs. Some goals deferred today due to time constraints and pt reporting incr pain in L ball of her foot with incr gait distances. Pt did not meet goal related to gait speed, performed in 2.9 ft/sec today (previously was 3.5 ft/sec). Pt met LTG in regards to FGA, improved balance to a 27/30 (at eval was  20/30), decr pt's risk of falls at a low fall risk. LTGs revised and ongoing as appropriate for remainder of POC.  Used L ankle ASO today during session to assist with ankle stability.    Personal Factors and Comorbidities Behavior Pattern;Past/Current Experience;Time since onset of injury/illness/exacerbation    Examination-Activity Limitations Bathing;Transfers;Bend;Carry;Reach Overhead;Stand;Locomotion Level;Stairs;Squat    Examination-Participation Restrictions Yard Work;Cleaning;Laundry;Community Activity;Meal Prep;Occupation    Stability/Clinical Decision Making Stable/Uncomplicated    Rehab Potential Good    PT Frequency 1x / week    PT Duration 12 weeks    PT Treatment/Interventions ADLs/Self Care Home Management;DME Instruction;Gait training;Stair training;Functional mobility training;Therapeutic activities;Therapeutic exercise;Balance training;Neuromuscular re-education;Patient/family education;Vestibular;Energy conservation;Electrical Stimulation;Passive range of motion;Manual techniques;Splinting;Orthotic Fit/Training;Aquatic Therapy    PT Next Visit Plan balance on compliant surfaces and outdoors if weather isn't so hot. use L ASO for ankle stability. Weight shifting, lunges. step ups. DF ROM with LLE. half and tall kneeling activities.    Consulted and Agree with Plan of Care Patient             Patient will benefit from skilled therapeutic intervention in order to improve the following deficits and impairments:  Decreased activity tolerance, Decreased balance, Decreased endurance, Decreased mobility, Decreased strength, Postural dysfunction, Abnormal gait, Difficulty walking, Impaired tone, Decreased range of motion, Decreased coordination, Impaired UE functional use, Pain, Impaired flexibility  Visit Diagnosis: Muscle weakness (generalized)  Other abnormalities of gait and mobility  Unsteadiness on feet     Problem List Patient Active Problem List   Diagnosis Date  Noted   Tracheostomy status ([place]) 07/05/2019   Major depressive disorder, recurrent episode, severe ([place]) 06/21/2019   Postcoital UTI 05/24/2019   Spastic hemiparesis ([facility])    Vascular headache    Mood disorder in conditions classified elsewhere  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   Polysubstance dependence in early, early partial, sustained full, or sustained partial remission ([place])    Dyslipidemia    Ischemic stroke diagnosed during current admission ([place]) 02/13/2019   MDD (major depressive disorder), recurrent episode, moderate ([place]) 02/10/2019   CVA (cerebral vascular accident) ([place]) 02/07/2019   TBI (traumatic brain injury) ([place]) 05/01/2016   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

## 2020-09-17 NOTE — Therapy (Signed)
[facility] [address] [place], [place], [address] Phone: [phone]   Fax:  [phone]  Occupational Therapy Treatment  Patient Details  Name: Jane Watkins MRN: [id number] Date of Birth: 1995-11-17 Referring Provider (OT): Sula Soda   Encounter Date: 09/17/2020   OT End of Session - 09/17/20 1536     Visit Number 8    Number of Visits 13    Date for OT Re-Evaluation 10/15/20    Authorization Type UHC 2022    Authorization Time Period Eval Date 07/24/2020  VL: 60OT (separate from 60PT)    OT Start Time 1536    OT Stop Time 1610   pt not feeling well and dismissed early   OT Time Calculation (min) 34 min    Activity Tolerance Patient tolerated treatment well    Behavior During Therapy [facility] for tasks assessed/performed             Past Medical History:  Diagnosis Date   AKI (acute kidney injury) ([facility]) 2018   "from overdose"   Anxiety    Chlamydia    Daily headache    Depression    Drug overdose 04/2016   [name] 05/01/2016   GERD (gastroesophageal reflux disease)    "when I was younger; gone now" (09/21/2017)   IV drug abuse ([facility]) 09/20/2017   Migraine    "q couple weeks" (09/21/2017)   Opioid abuse ([facility])    Overdose    Stroke [facility])     Past Surgical History:  Procedure Laterality Date   APPENDECTOMY  04/2013   I & D EXTREMITY Left 09/20/2017   Procedure: IRRIGATION AND DEBRIDEMENT LEFT ARM;  Surgeon: Bradly Bienenstock, MD;  [facility];  Service: Orthopedics;  Laterality: Left;   TEE WITHOUT CARDIOVERSION N/A 02/10/2019   Procedure: TRANSESOPHAGEAL ECHOCARDIOGRAM (TEE);  Surgeon: Debbe Odea, MD;  Location: [facility] ORS;  Service: Cardiovascular;  Laterality: N/A;  Polysubstance abuser   TONGUE SURGERY  ~ 2007   "related to lisp"   TRACHEOSTOMY  04/2016   [name] 05/01/2016    There were no vitals filed for this visit.   Subjective Assessment - 09/17/20 1541     Subjective  Pt denies  any pain. "A good friend of mine passed away so I had to go to his funeral"    Pertinent History Per chart. Pt. is a 25 y.o.female with a history of IV drug use, polysubstance as well as tobacco abuse, anxiety. Pt. presented to [facility] on 02/07/2019 after being found unresponsive.  EMS contacted patient received Narcan with some improvement in mental status.  Admission labs with WBC 20,700, potassium 5.2, BUN 25, creatinine 0.67, urine drug screen positive cocaine, marijuana and benzodiazepines, alcohol level less than 10, SARS coronavirus negative.  Cranial CT scan showed areas of indistinct low-density in the right basal ganglia with mass-effect on the right lateral ventricle.  Patient did not receive TPA.  MRI showed acute infarction of right MCA territory, affecting the basal ganglia and affecting the cortical and subcortical brain in a largely watershed distribution. Pt. had a previous inpatient rehab admission 05/01/2016 to 05/09/2016 for hypoxic encephalopathy after drug overdose requiring tracheostomy, and was later decannulated. Pt. resides with her boyfriend in an apartment. Pt. worked full time as a Production designer, theatre/television/film at [employer]. pt. enjoys travelling.    Patient Stated Goals Patient reports she would like to be as independent as possible with all her daily tasks.    Currently in Pain? No/denies  OT Treatments/Exercises (OP) - 09/17/20 1551       Neurological Re-education Exercises   Other Grasp and Release Exercises  picking up Jenga pieces and stacking on table for increased manipulation and coordination with grasp/release with LUE with mod difficulty and mod drops    Other Grasp and Release Exercises  semi circle pegboard with grasp and release of white dowels and placing into board with LUE. Pt completed with mod difficulty and mod drops.                      OT Short Term Goals - 08/23/20 1748       OT SHORT TERM GOAL #1   Title Pt will be  independent with HEP    Time 4    Period Weeks    Status Achieved    Target Date 08/21/20      OT SHORT TERM GOAL #2   Title Pt will demonstrate tying bilateral shoes with one handed technique, bilateral hands or with adapted equipment PRN.    Time 4    Period Weeks    Status Achieved      OT SHORT TERM GOAL #3   Title Pt will perform Box and Blocks with LUE with score of 6 or greater with divider for increase in functional use of LUE.    Baseline LUE 4 (without divider) RUE 49.  08/23/20 - 8 BLOCKS    Time 4    Period Weeks    Status Achieved   08/14/20 - practiced grasp/release of blocks     OT SHORT TERM GOAL #4   Title Pt will verbalize understanding of adapted strategies and/or equipment PRN for increased safety and independence with ADLs and IADLs (cutting up vegetables and food, tying shoes, clasping bra, etc)    Time 4    Period Weeks    Status Achieved               OT Long Term Goals - 08/23/20 1853       OT LONG TERM GOAL #1   Title Pt will be independent with updated HEP    Time 12    Period Weeks    Status On-going      OT LONG TERM GOAL #2   Title Pt will demonstrate increased active shoulder flexion to 130 degrees with min compensations for preparing for reaching to comb hair and assistance styling, etc.    Baseline 130 PROM 110 AROM    Time 12    Period Weeks    Status On-going      OT LONG TERM GOAL #3   Title Pt will perform Box and Blocks with score of 10 or greater with LUE with divider in order to increase functional use and grasp/release with LUE.    Baseline 4 L (without divider) 49 R    Time 12    Period Weeks    Status On-going      OT LONG TERM GOAL #4   Title Pt will increase grip strength in LUE to 15 lbs or greater for increasing functional use of LUE    Baseline L 8.8 R 57.9    Time 12    Period Weeks    Status On-going      OT LONG TERM GOAL #5   Title Pt will demonstrate ability to style hair with adapted strateiges and/or  equipment PRN.    Time 12    Period Weeks    Status  On-going   08/14/20 - reports completing this w/ Mod I at home     OT LONG TERM GOAL #6   Title Pt will be independent with any wear and care of splints and/or orthoses PRN    Time 12    Period Weeks    Status On-going                   Plan - 09/17/20 1620     Clinical Impression Statement Pt not feeling well today but participated well and demonstrated improved grasp/release and coordination in LUE.    OT Occupational Profile and History Detailed Assessment- Review of Records and additional review of physical, cognitive, psychosocial history related to current functional performance    Occupational performance deficits (Please refer to evaluation for details): ADL's;IADL's    Body Structure / Function / Physical Skills ADL;IADL;Coordination;Endurance;UE functional use;Decreased knowledge of precautions;Dexterity;FMC;ROM;Tone;Strength;GMC;Decreased knowledge of use of DME    Rehab Potential Good    Clinical Decision Making Several treatment options, min-mod task modification necessary    Comorbidities Affecting Occupational Performance: May have comorbidities impacting occupational performance    Modification or Assistance to Complete Evaluation  Min-Moderate modification of tasks or assist with assess necessary to complete eval    OT Frequency 1x / week    OT Duration 12 weeks   1x/week for 12 weeks   OT Treatment/Interventions Self-care/ADL training;DME and/or AE instruction;Energy conservation;Therapeutic activities;Patient/family education;Therapeutic exercise;Neuromuscular education;Aquatic Therapy;Moist Heat;Electrical Stimulation;Ultrasound;Paraffin;Functional Mobility Training;Traction;Splinting;Fluidtherapy;Cognitive remediation/compensation;Passive range of motion;Manual Therapy    Plan has resting hand splint (prefab) fits OK -  She was supposed to wear it 2 hrs/night before bed to see if it helped loosen hand.  hand  funcitoning, weight bearing    Consulted and Agree with Plan of Care Patient             Patient will benefit from skilled therapeutic intervention in order to improve the following deficits and impairments:   Body Structure / Function / Physical Skills: ADL, IADL, Coordination, Endurance, UE functional use, Decreased knowledge of precautions, Dexterity, FMC, ROM, Tone, Strength, GMC, Decreased knowledge of use of DME       Visit Diagnosis: Muscle weakness (generalized)  Other abnormalities of gait and mobility  Unsteadiness on feet  Other lack of coordination  Spastic hemiplegia of left dominant side as late effect of cerebral infarction (HCC)  Attention and concentration deficit  Frontal lobe and executive function deficit    Problem List Patient Active Problem List   Diagnosis Date Noted   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   Major depressive disorder, recurrent episode, severe (HCC) 06/21/2019   Postcoital UTI 05/24/2019   Spastic hemiparesis (HCC)    Vascular headache    Mood disorder in conditions classified elsewhere    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   Polysubstance dependence in early, early partial, sustained full, or sustained partial remission (HCC)    Dyslipidemia    Ischemic stroke diagnosed during current admission ([facility]CC) 02/13/2019   MDD (major depressive disorder), recurrent episode, moderate (HCC) 02/10/2019   CVA (cerebral vascular accident) (HCC) 02/07/2019   TBI (traumatic brain injury) (HCC) 05/01/2016   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 [place], [place], [address] Phone: [phone]   Fax:  [phone]  Name: Jane Watkins MRN: [id number] Date of Birth: October 15, 1995

## 2020-09-24 ENCOUNTER — Other Ambulatory Visit: Payer: Self-pay

## 2020-09-24 ENCOUNTER — Ambulatory Visit: Payer: 59 | Admitting: Occupational Therapy

## 2020-09-24 ENCOUNTER — Ambulatory Visit: Payer: 59 | Admitting: Physical Therapy

## 2020-09-24 ENCOUNTER — Encounter: Payer: Self-pay | Admitting: Physical Therapy

## 2020-09-24 ENCOUNTER — Encounter: Payer: Self-pay | Admitting: Occupational Therapy

## 2020-09-24 DIAGNOSIS — R41840 Attention and concentration deficit: Secondary | ICD-10-CM

## 2020-09-24 DIAGNOSIS — I69352 Hemiplegia and hemiparesis following cerebral infarction affecting left dominant side: Secondary | ICD-10-CM

## 2020-09-24 DIAGNOSIS — M6281 Muscle weakness (generalized): Secondary | ICD-10-CM

## 2020-09-24 DIAGNOSIS — R2681 Unsteadiness on feet: Secondary | ICD-10-CM

## 2020-09-24 DIAGNOSIS — R2689 Other abnormalities of gait and mobility: Secondary | ICD-10-CM

## 2020-09-24 DIAGNOSIS — R41844 Frontal lobe and executive function deficit: Secondary | ICD-10-CM

## 2020-09-24 DIAGNOSIS — R278 Other lack of coordination: Secondary | ICD-10-CM

## 2020-09-24 NOTE — Therapy (Signed)
[facility] [address] [place], [place], [address] Phone: [phone]   Fax:  [phone]  Occupational Therapy Treatment  Patient Details  Name: Jane CarinaLYDIA Watkins MRN: [id number] Date of Birth: 01/27/1996 Referring Provider (OT): Sula Sodaaulkar, Krutika   Encounter Date: 09/24/2020   OT End of Session - 09/24/20 1539     Visit Number 9    Number of Visits 13    Date for OT Re-Evaluation 10/15/20    Authorization Type UHC 2022    Authorization Time Period Eval Date 07/24/2020  VL: 60OT (separate from 60PT)    OT Start Time 1535    OT Stop Time 1615    OT Time Calculation (min) 40 min    Activity Tolerance Patient tolerated treatment well    Behavior During Therapy [facility] for tasks assessed/performed             Past Medical History:  Diagnosis Date   AKI (acute kidney injury) (HCC) 2018   "from overdose"   Anxiety    Chlamydia    Daily headache    Depression    Drug overdose 04/2016   [name]/notes 05/01/2016   GERD (gastroesophageal reflux disease)    "when I was younger; gone now" (09/21/2017)   IV drug abuse (HCC) 09/20/2017   Migraine    "q couple weeks" (09/21/2017)   Opioid abuse (HCC)    Overdose    Stroke [facility](HCC)     Past Surgical History:  Procedure Laterality Date   APPENDECTOMY  04/2013   I & D EXTREMITY Left 09/20/2017   Procedure: IRRIGATION AND DEBRIDEMENT LEFT ARM;  Surgeon: Bradly Bienenstockrtmann, Fred, MD;  Location: [facility] OR;  Service: Orthopedics;  Laterality: Left;   TEE WITHOUT CARDIOVERSION N/A 02/10/2019   Procedure: TRANSESOPHAGEAL ECHOCARDIOGRAM (TEE);  Surgeon: Debbe OdeaAgbor-Etang, Brian, MD;  Location: [facility] ORS;  Service: Cardiovascular;  Laterality: N/A;  Polysubstance abuser   TONGUE SURGERY  ~ 2007   "related to lisp"   TRACHEOSTOMY  04/2016   [name]/notes 05/01/2016    There were no vitals filed for this visit.   Subjective Assessment - 09/24/20 1538     Subjective  "i'm ready to get my botox this hand is pretty  tight" - pt reports scheduled for 8/26 for botox injections.    Pertinent History Per chart. Pt. is a 25 y.o.female with a history of IV drug use, polysubstance as well as tobacco abuse, anxiety. [facility] after being found unresponsive.  EMS contacted patient received Narcan with some improvement in mental status.  Admission labs with WBC 20,700, potassium 5.2, BUN 25, creatinine 0.67, urine drug screen positive cocaine, marijuana and benzodiazepines, alcohol level less than 10, SARS coronavirus negative.  Cranial CT scan showed areas of indistinct low-density in the right basal ganglia with mass-effect on the right lateral ventricle.  Patient did not receive TPA.  MRI showed acute infarction of right MCA territory, affecting the basal ganglia and affecting the cortical and subcortical brain in a largely watershed distribution. Pt. had a previous inpatient rehab admission 05/01/2016 to 05/09/2016 for hypoxic encephalopathy after drug overdose requiring tracheostomy, and was later decannulated. Pt. resides with her boyfriend in an apartment. Pt. worked full time as a Production designer, theatre/television/filmmanager at [employer]. pt. enjoys travelling.    Patient Stated Goals Patient reports she would like to be as independent as possible with all her daily tasks.    Currently in Pain? No/denies  OT Treatments/Exercises (OP) - 09/24/20 1542       ADLs   Cooking reports cutting up tomato with success and good safety.      Neurological Re-education Exercises   Other Grasp and Release Exercises  picking up bean bags and placing into bowl with LUE with emphasis on getting thumb in palmar abduction for grasping object/bean bag    Other Grasp and Release Exercises  picking up 1 inch blocks with LUE with using RUE for stabilizing block to get hand in position    Other Weight-Bearing Exercises 1 weight bearing into LUE while standing at counter top - encourage patient to do more at home  with weight bearing. pt reports doing WB during brushing teeth      Splinting   Splinting issued CMC thumb splint (prefab) for LUE (small). Pt demonstarted improved ability to get thumb around 1 inch blocks with use of splint                      OT Short Term Goals - 08/23/20 1748       OT SHORT TERM GOAL #1   Title Pt will be independent with HEP    Time 4    Period Weeks    Status Achieved    Target Date 08/21/20      OT SHORT TERM GOAL #2   Title Pt will demonstrate tying bilateral shoes with one handed technique, bilateral hands or with adapted equipment PRN.    Time 4    Period Weeks    Status Achieved      OT SHORT TERM GOAL #3   Title Pt will perform Box and Blocks with LUE with score of 6 or greater with divider for increase in functional use of LUE.    Baseline LUE 4 (without divider) RUE 49.  08/23/20 - 8 BLOCKS    Time 4    Period Weeks    Status Achieved   08/14/20 - practiced grasp/release of blocks     OT SHORT TERM GOAL #4   Title Pt will verbalize understanding of adapted strategies and/or equipment PRN for increased safety and independence with ADLs and IADLs (cutting up vegetables and food, tying shoes, clasping bra, etc)    Time 4    Period Weeks    Status Achieved               OT Long Term Goals - 08/23/20 1853       OT LONG TERM GOAL #1   Title Pt will be independent with updated HEP    Time 12    Period Weeks    Status On-going      OT LONG TERM GOAL #2   Title Pt will demonstrate increased active shoulder flexion to 130 degrees with min compensations for preparing for reaching to comb hair and assistance styling, etc.    Baseline 130 PROM 110 AROM    Time 12    Period Weeks    Status On-going      OT LONG TERM GOAL #3   Title Pt will perform Box and Blocks with score of 10 or greater with LUE with divider in order to increase functional use and grasp/release with LUE.    Baseline 4 L (without divider) 49 R    Time 12     Period Weeks    Status On-going      OT LONG TERM GOAL #4   Title Pt will increase grip  strength in LUE to 15 lbs or greater for increasing functional use of LUE    Baseline L 8.8 R 57.9    Time 12    Period Weeks    Status On-going      OT LONG TERM GOAL #5   Title Pt will demonstrate ability to style hair with adapted strateiges and/or equipment PRN.    Time 12    Period Weeks    Status On-going   08/14/20 - reports completing this w/ Mod I at home     OT LONG TERM GOAL #6   Title Pt will be independent with any wear and care of splints and/or orthoses PRN    Time 12    Period Weeks    Status On-going                   Plan - 09/24/20 1617     Clinical Impression Statement Pt with improved grasp/release with CMC thumb prefab    OT Occupational Profile and History Detailed Assessment- Review of Records and additional review of physical, cognitive, psychosocial history related to current functional performance    Occupational performance deficits (Please refer to evaluation for details): ADL's;IADL's    Body Structure / Function / Physical Skills ADL;IADL;Coordination;Endurance;UE functional use;Decreased knowledge of precautions;Dexterity;FMC;ROM;Tone;Strength;GMC;Decreased knowledge of use of DME    Rehab Potential Good    Clinical Decision Making Several treatment options, min-mod task modification necessary    Comorbidities Affecting Occupational Performance: May have comorbidities impacting occupational performance    Modification or Assistance to Complete Evaluation  Min-Moderate modification of tasks or assist with assess necessary to complete eval    OT Frequency 1x / week    OT Duration 12 weeks   1x/week for 12 weeks   OT Treatment/Interventions Self-care/ADL training;DME and/or AE instruction;Energy conservation;Therapeutic activities;Patient/family education;Therapeutic exercise;Neuromuscular education;Aquatic Therapy;Moist Heat;Electrical  Stimulation;Ultrasound;Paraffin;Functional Mobility Training;Traction;Splinting;Fluidtherapy;Cognitive remediation/compensation;Passive range of motion;Manual Therapy    Plan has resting hand splint (prefab) fits OK ,weight bearing    Consulted and Agree with Plan of Care Patient             Patient will benefit from skilled therapeutic intervention in order to improve the following deficits and impairments:   Body Structure / Function / Physical Skills: ADL, IADL, Coordination, Endurance, UE functional use, Decreased knowledge of precautions, Dexterity, FMC, ROM, Tone, Strength, GMC, Decreased knowledge of use of DME       Visit Diagnosis: Muscle weakness (generalized)  Other abnormalities of gait and mobility  Unsteadiness on feet  Spastic hemiplegia of left dominant side as late effect of cerebral infarction [facility])  Other lack of coordination  Attention and concentration deficit  Frontal lobe and executive function deficit    Problem List Patient Active Problem List   Diagnosis Date Noted   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   Major depressive disorder, recurrent episode, severe (HCC) 06/21/2019   Postcoital UTI 05/24/2019   Spastic hemiparesis (HCC)    Vascular headache    Mood disorder in conditions classified elsewhere    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 [place], [place], [address] Phone: [phone]   Fax:  [phone]  Name: Jane Watkins MRN: [id number] Date of Birth: Jan 12, 1996

## 2020-09-24 NOTE — Therapy (Signed)
[facility] [address] [place], [place], [address] Phone: [phone]   Fax:  [phone]  Physical Therapy Treatment  Patient Details  Name: Jane CarinaLYDIA SKOGLUND MRN: [id number] Date of Birth: 05/18/1995 Referring Provider (PT): Carlis Abbottaulkar, Drema PryKrutika P, MD   Encounter Date: 09/24/2020   PT End of Session - 09/24/20 1653     Visit Number 10    Number of Visits 13    Date for PT Re-Evaluation 10/22/20    Authorization Type UHC, 60 visit limit per discipline    Authorization - Visit Number 10    Authorization - Number of Visits 60    PT Start Time 1409   pt late to session   PT Stop Time 1445    PT Time Calculation (min) 36 min    Activity Tolerance Patient tolerated treatment well    Behavior During Therapy [facility] for tasks assessed/performed             Past Medical History:  Diagnosis Date   AKI (acute kidney injury) (HCC) 2018   "from overdose"   Anxiety    Chlamydia    Daily headache    Depression    Drug overdose 04/2016   [name]/notes 05/01/2016   GERD (gastroesophageal reflux disease)    "when I was younger; gone now" (09/21/2017)   IV drug abuse (HCC) 09/20/2017   Migraine    "q couple weeks" (09/21/2017)   Opioid abuse (HCC)    Overdose    Stroke [facility](HCC)     Past Surgical History:  Procedure Laterality Date   APPENDECTOMY  04/2013   I & D EXTREMITY Left 09/20/2017   Procedure: IRRIGATION AND DEBRIDEMENT LEFT ARM;  Surgeon: Bradly Bienenstockrtmann, Fred, MD;  Location: [facility] OR;  Service: Orthopedics;  Laterality: Left;   TEE WITHOUT CARDIOVERSION N/A 02/10/2019   Procedure: TRANSESOPHAGEAL ECHOCARDIOGRAM (TEE);  Surgeon: Debbe OdeaAgbor-Etang, Brian, MD;  Location: [facility] ORS;  Service: Cardiovascular;  Laterality: N/A;  Polysubstance abuser   TONGUE SURGERY  ~ 2007   "related to lisp"   TRACHEOSTOMY  04/2016   [name]/notes 05/01/2016    There were no vitals filed for this visit.   Subjective Assessment - 09/24/20 1412     Subjective Got her  night splint and it has been going well. No falls. Ball of L foot is still bothersome.    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    Limitations Walking;Lifting;House hold activities;Standing    How long can you walk comfortably? 1 hour    Patient Stated Goals wants to try some electrical stimulation, improve her walking    Currently in Pain? Yes    Pain Score 4     Pain Location Foot   ball of L foot   Pain Orientation Left    Pain Descriptors / Indicators Aching    Pain Type Acute pain    Pain Onset More than a month ago    Aggravating Factors  walking    Pain Relieving Factors rest                               [facility] Adult PT Treatment/Exercise - 09/24/20 1426       Ambulation/Gait   Ambulation/Gait Yes    Ambulation/Gait Assistance  5: Supervision    Ambulation/Gait Assistance Details cues to think about L heel strike/getting full foot contact on ground with each step as pt ambulates with just toe contact and ball of foot and pt has been reporting incr pain in that area    Ambulation Distance (Feet) 230 Feet    Assistive device None    Gait Pattern Step-through pattern;Decreased stride length;Lateral hip instability;Poor foot clearance - left;Left circumduction;Decreased weight shift to left;Decreased dorsiflexion - left;Decreased stance time - left;Decreased arm swing - right;Decreased arm swing - left    Ambulation Surface Level;Indoor      Therapeutic Activites    Therapeutic Activities Other Therapeutic Activities    Other Therapeutic Activities pt reporting incr pain to L ball of foot esp during gait. inspected this area with no incr redness noted and no pain with palpation. asked if pt would be intersted in pursuing an AFO after improved ROM with nightsplint  (pt has lost her previous AFO), pt reports she could not afford it at this time. discussed to continue use of night splint every night and importance of stretching and using ankle brace on compliant surfaces for improved stability      Exercises   Exercises Other Exercises    Other Exercises  seated L ankle DF stretch along with hamstring stretch with therapist assisting DF ROM 4x 30 seconds, then PROM into L ankle eversion x10 repes                 Balance Exercises - 09/24/20 1426       Balance Exercises: Standing   SLS with Vectors Foam/compliant surface    SLS with Vectors Limitations on rockerboard with LLE as stance leg stepping RLE off and on posteriorly x10 reps with therapist assisting keeping LLE in contact with board for incr ankle DF, LLE as stance RLE forward tap to cone x10 reps, cross tap to cone 10 reps with intermittent UE support    Rockerboard Anterior/posterior;Limitations    Rockerboard Limitations eyes open: head turns x10 reps, head nods x10 reps, eyes closed 3x 30 seconds    Step Ups Forward;UE support 1;6 inch;Limitations    Step Ups Limitations Leading with LLE: 1st 10 reps step ups without UE support, an additional x10 reps with marching RLE with single UE support and incr stance time on LLE   Sidestepping 3 reps;Limitations;Foam/compliant support    Sidestepping Limitations down and back 3 reps on blue balance beam, cues for foot clearance    Other Standing Exercises standing on blue foam beam with LLE as stance leg: x12 reps stepping RLE out and then back to midline               PT Education - 09/24/20 1653     Education Details see TA    Person(s) Educated Patient    Methods Explanation    Comprehension Verbalized understanding              PT Short Term Goals - 08/23/20 1731       PT SHORT TERM GOAL #1   Title Pt will be independent with initial HEP in order to indicate improved strength and decreased fall risk. ALL STGS DUE 08/21/20     Baseline reports independence with HEP, completed 3-4x/week    Time 4    Period Weeks    Status Achieved    Target Date 08/21/20      PT SHORT TERM GOAL #2   Title Pt will undergo further  assessment of with LTG written as appropriate in order to demo improved endurance for community mobility.    Baseline 1513 ft    Time 4    Period Weeks    Status Achieved      PT SHORT TERM GOAL #3   Title Pt will improve FGA score to at least a 22/30 in order to demo decr fall risk.    Baseline 20/30; 22/30    Time 4    Period Weeks    Status Achieved               PT Long Term Goals - 09/17/20 1514       PT LONG TERM GOAL #1   Title Pt will be independent with final HEP in order to indicate improved strength and decreased fall risk. ALL LTG DUE 10/15/20    Baseline pt will continue to benefit from ongoing updates/additions    Time 12    Period Weeks    Status On-going    Target Date 10/15/20      PT LONG TERM GOAL #2   Title Patient will be able to ambulate >/= 1900 ft with to indicate improved endurance for community mobility and within age related normsl    Baseline 1513 ft, did not have time to perform on 09/17/20    Time 12    Period Weeks    Status On-going      PT LONG TERM GOAL #3   Title --    Baseline --    Time --    Period --    Status --      PT LONG TERM GOAL #4   Title Pt will improve gait speed with no AD to at least 3.6 ft/sec for improved community mobility    Baseline 3.3 ft/sec, 2.9 ft/sec  on 09/17/20    Time 12    Period Weeks    Status Revised      PT LONG TERM GOAL #5   Title Pt will ambulate at least 1000' outdoors over grass surfaces with mod I in order to demo improved community mobility.    Baseline did not have time to perform on 09/17/20    Time 12    Period Weeks    Status On-going                   Plan - 09/24/20 1657     Clinical Impression Statement Pt continues to report incr pain to middle of L foot, no redness  noted. Cues for incr foot contact during gait-due to pt's tone, pt with primarily toe contact during gait with foot in an inversion position. When cued, pt can get foot flat during gait. Remainder of session focused on balance stratgies on compliant surfaces and with SLS, continued use of ASO brace for improved stability. No reports of pain throughout balance activities today.    Personal Factors and Comorbidities Behavior Pattern;Past/Current Experience;Time since onset of injury/illness/exacerbation    Examination-Activity Limitations Bathing;Transfers;Bend;Carry;Reach Overhead;Stand;Locomotion Level;Stairs;Squat    Examination-Participation Restrictions Yard Work;Cleaning;Laundry;Community Activity;Meal Prep;Occupation    Stability/Clinical Decision Making Stable/Uncomplicated    Rehab Potential Good    PT Frequency 1x / week    PT Duration 12 weeks    PT Treatment/Interventions ADLs/Self Care Home Management;DME Instruction;Gait training;Stair training;Functional mobility training;Therapeutic activities;Therapeutic exercise;Balance training;Neuromuscular re-education;Patient/family education;Vestibular;Energy conservation;Electrical Stimulation;Passive range of motion;Manual techniques;Splinting;Orthotic Fit/Training;Aquatic Therapy    PT Next Visit Plan balance on compliant surfaces and outdoors if weather isn't  so hot. use L ASO for ankle stability. Weight shifting, lunges. step ups. DF ROM with LLE. half and tall kneeling activities.    Consulted and Agree with Plan of Care Patient             Patient will benefit from skilled therapeutic intervention in order to improve the following deficits and impairments:  Decreased activity tolerance, Decreased balance, Decreased endurance, Decreased mobility, Decreased strength, Postural dysfunction, Abnormal gait, Difficulty walking, Impaired tone, Decreased range of motion, Decreased coordination, Impaired UE functional use, Pain, Impaired  flexibility  Visit Diagnosis: Muscle weakness (generalized)  Other abnormalities of gait and mobility  Unsteadiness on feet     Problem List Patient Active Problem List   Diagnosis Date Noted   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   Major depressive disorder, recurrent episode, severe (HCC) 06/21/2019   Postcoital UTI 05/24/2019   Spastic hemiparesis (HCC)    Vascular headache    Mood disorder in conditions classified elsewhere    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   Polysubstance dependence in early, early partial, sustained full, or sustained partial remission (HCC)    Dyslipidemia    Ischemic stroke diagnosed during current admission (HCC) 02/13/2019   MDD (major depressive disorder), recurrent episode, moderate (HCC) 02/10/2019   CVA (cerebral vascular accident) (HCC) 02/07/2019   TBI (traumatic brain injury) (HCC) 05/01/2016   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

## 2020-10-01 ENCOUNTER — Ambulatory Visit: Payer: 59 | Admitting: Occupational Therapy

## 2020-10-01 ENCOUNTER — Ambulatory Visit: Payer: 59 | Admitting: Physical Therapy

## 2020-10-05 ENCOUNTER — Telehealth: Payer: Self-pay | Admitting: Physical Medicine and Rehabilitation

## 2020-10-05 ENCOUNTER — Encounter: Payer: 59 | Attending: Physical Medicine and Rehabilitation | Admitting: Physical Medicine and Rehabilitation

## 2020-10-05 ENCOUNTER — Other Ambulatory Visit: Payer: Self-pay

## 2020-10-05 VITALS — BP 112/75 | HR 85 | Temp 98.3°F | Ht 62.0 in | Wt 127.8 lb

## 2020-10-05 DIAGNOSIS — G8110 Spastic hemiplegia affecting unspecified side: Secondary | ICD-10-CM | POA: Insufficient documentation

## 2020-10-05 NOTE — Progress Notes (Signed)
Botox Injection for left-sided spasticity using needle US  guidance  Indication: Severe spasticity which interferes with ADL,mobility and/or  hygiene and is unresponsive to medication management and other conservative care Informed consent was obtained after describing risks and benefits of the procedure with the patient. This includes bleeding, bruising, infection, excessive weakness, or medication side effects. A REMS form is on file and signed. Number of units per muscle Left soleus: 75U divided in 3 sites Left gastrocnemius: 75U divided in 3 sites Left flexor pollicis longus: 20U in 1 site Left flexor pollicis brevis: 25U in 1 site Left opponens pollicis:25 U in 1 site Left adductor pollicis: 20U in 1 site Left Flexor digitorus profundus: 50U Left flexor digitorum superficialis: 50U Flexor carpi radialis: 20U Flexor carpi ulnaris: 40U   All injections were done after obtaining appropriate US guidance and after negative drawback for blood. The patient tolerated the procedure well. Post procedure instructions were given. A followup appointment was made.

## 2020-10-05 NOTE — Telephone Encounter (Signed)
Dr. Carlis Abbott brought me a sticky note, that patient need medical records sent to her Attorney's office.  I did tell her that a medical release would need to be signed by patient.  I have left patient a message that she needs to file a release with her attorney's office and have them fax it to [place]. She listed [employer].

## 2020-10-08 ENCOUNTER — Encounter: Payer: Self-pay | Admitting: Physical Therapy

## 2020-10-08 ENCOUNTER — Ambulatory Visit: Payer: 59 | Admitting: Physical Therapy

## 2020-10-08 ENCOUNTER — Other Ambulatory Visit: Payer: Self-pay

## 2020-10-08 ENCOUNTER — Ambulatory Visit: Payer: 59 | Admitting: Occupational Therapy

## 2020-10-08 DIAGNOSIS — M6281 Muscle weakness (generalized): Secondary | ICD-10-CM

## 2020-10-08 DIAGNOSIS — R2681 Unsteadiness on feet: Secondary | ICD-10-CM

## 2020-10-08 DIAGNOSIS — R2689 Other abnormalities of gait and mobility: Secondary | ICD-10-CM

## 2020-10-08 DIAGNOSIS — I69352 Hemiplegia and hemiparesis following cerebral infarction affecting left dominant side: Secondary | ICD-10-CM

## 2020-10-08 DIAGNOSIS — R278 Other lack of coordination: Secondary | ICD-10-CM

## 2020-10-08 DIAGNOSIS — R41840 Attention and concentration deficit: Secondary | ICD-10-CM

## 2020-10-08 DIAGNOSIS — R41844 Frontal lobe and executive function deficit: Secondary | ICD-10-CM

## 2020-10-08 NOTE — Therapy (Addendum)
[facility] [address] [place], [place], [address] Phone: [phone]   Fax:  [phone]  Physical Therapy Treatment  Patient Details  Name: Jane CarinaLYDIA SKOGLUND MRN: [id number] Date of Birth: 05/07/1995 Referring Provider (PT): Carlis Abbottaulkar, Drema PryKrutika P, MD   Encounter Date: 10/08/2020   PT End of Session - 10/08/20 1444     Visit Number 11    Number of Visits 13    Date for PT Re-Evaluation 10/22/20    Authorization Type UHC, 60 visit limit per discipline    Authorization - Visit Number 11    Authorization - Number of Visits 60    PT Start Time 1401    PT Stop Time 1442    PT Time Calculation (min) 41 min    Activity Tolerance Patient tolerated treatment well    Behavior During Therapy [facility] for tasks assessed/performed             Past Medical History:  Diagnosis Date   AKI (acute kidney injury) (HCC) 2018   "from overdose"   Anxiety    Chlamydia    Daily headache    Depression    Drug overdose 04/2016   [name]/notes 05/01/2016   GERD (gastroesophageal reflux disease)    "when I was younger; gone now" (09/21/2017)   IV drug abuse (HCC) 09/20/2017   Migraine    "q couple weeks" (09/21/2017)   Opioid abuse (HCC)    Overdose    Stroke [facility](HCC)     Past Surgical History:  Procedure Laterality Date   APPENDECTOMY  04/2013   I & D EXTREMITY Left 09/20/2017   Procedure: IRRIGATION AND DEBRIDEMENT LEFT ARM;  Surgeon: Bradly Bienenstockrtmann, Fred, MD;  [facility]: [facility] OR;  Service: Orthopedics;  Laterality: Left;   TEE WITHOUT CARDIOVERSION N/A 02/10/2019   Procedure: TRANSESOPHAGEAL ECHOCARDIOGRAM (TEE);  Surgeon: Debbe OdeaAgbor-Etang, Brian, MD;  Location: [facility] ORS;  Service: Cardiovascular;  Laterality: N/A;  Polysubstance abuser   TONGUE SURGERY  ~ 2007   "related to lisp"   TRACHEOSTOMY  04/2016   [name]/notes 05/01/2016    There were no vitals filed for this visit.   Subjective Assessment - 10/08/20 1404     Subjective Got botox last Friday -  feels like she can get her heel down farther on the ground. Had a fall last Wednesday - was going up some stairs at her apartment with no railings and tripped forwards.    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    Limitations Walking;Lifting;House hold activities;Standing    How long can you walk comfortably? 1 hour    Patient Stated Goals wants to try some electrical stimulation, improve her walking    Currently in Pain? Yes    Pain Score 5     Pain Location Back    Pain Orientation Right    Pain Descriptors / Indicators Cramping;Stabbing;Shooting;Sharp    Pain Type Acute pain    Pain Onset More than a month ago    Aggravating Factors  not sure    Pain Relieving Factors nothing                               [facility] Adult PT Treatment/Exercise - 10/08/20 1440       Ambulation/Gait  Ambulation/Gait Yes    Ambulation/Gait Assistance 5: Supervision    Ambulation/Gait Assistance Details cues for heel strike with LLE. pt to purchase her own ankle ASO to assist with ankle stability on unlevel surfaces (has not been able to find her own)    Assistive device None    Gait Pattern Step-through pattern;Decreased stride length;Lateral hip instability;Poor foot clearance - left;Left circumduction;Decreased weight shift to left;Decreased dorsiflexion - left;Decreased stance time - left;Decreased arm swing - right;Decreased arm swing - left    Ambulation Surface Level;Indoor    Stairs Yes    Stairs Assistance 5: Supervision    Stairs Assistance Details (indicate cue type and reason) pt ascending first with RLE and descending with RLE first (easier than weaker LLE), discussed if pt enters steps again in community with no handrail to perform both ascending/descending with step to pattern  vs. reciprocal for incr safety/balance    Stair Management Technique No rails;Step to pattern    Number of Stairs 4   x2   Height of Stairs 6               Access Code: [email or web address] URL: [email or web address] Date: 10/08/2020 Prepared by: Sherlie Ban   Verbally reviewed stretches below for ankle DF:  Exercises Supine Ankle Dorsiflexion Stretch with Caregiver - 1 x daily - 7 x weekly - 1 sets - 3 reps - 30 - 60 second hold hold Standing Gastroc Stretch on Step with Counter Support - 1 x daily - 7 x weekly - 3 sets - 3 reps - 30 seconds hold   New stretches added on 10/08/20  Supine Single Knee to Chest Stretch - 1 x daily - 5 x weekly - 3 sets - 10 reps - performed bilaterally  Long Sitting Hamstring Stretch - 1 x daily - 5 x weekly - 3 sets - 30 reps - cues for anterior pelvic tilt/posture Standing Ankle Dorsiflexion Stretch on Chair - 1 x daily - 5 x weekly - 3 sets - 15-10 hold - modified performing on step   Balance Exercises - 10/08/20 1440       Balance Exercises: Standing   Standing Eyes Closed Foam/compliant surface;Limitations    Standing Eyes Closed Limitations on blue air ex feet hip width 2 x 30 seconds, x10 reps head turns, x10 reps head nods    SLS with Vectors Foam/compliant surface    SLS with Vectors Limitations standing on air ex alternating toe taps to 2 cones x10 reps B, cues for slowed and controlled, intermittent UE support standing on LLE    Rockerboard Anterior/posterior;Lateral;Limitations    Rockerboard Limitations on lower profile rockerboard - stepping RLE off forwards and back on board for SLS on LLE and closed chain ankle DF x10 reps, then performed stepping RLE off posteriorly x10 reps    Other Standing Exercises on blue air ex with LLE: x10 reps RLE stepping on and off for incr SLS time on LLE               PT Education - 10/08/20 1443     Education Details stretching updates to pt's HEP    Person(s) Educated Patient     Methods Explanation;Demonstration;Handout    Comprehension Verbalized understanding;Returned demonstration              PT Short Term Goals - 08/23/20 1731       PT SHORT TERM GOAL #1   Title Pt will be independent with initial HEP in order to indicate improved strength  and decreased fall risk. ALL STGS DUE 08/21/20    Baseline reports independence with HEP, completed 3-4x/week    Time 4    Period Weeks    Status Achieved    Target Date 08/21/20      PT SHORT TERM GOAL #2   Title Pt will undergo further assessment of with LTG written as appropriate in order to demo improved endurance for community mobility.    Baseline 1513 ft    Time 4    Period Weeks    Status Achieved      PT SHORT TERM GOAL #3   Title Pt will improve FGA score to at least a 22/30 in order to demo decr fall risk.    Baseline 20/30; 22/30    Time 4    Period Weeks    Status Achieved               PT Long Term Goals - 09/17/20 1514       PT LONG TERM GOAL #1   Title Pt will be independent with final HEP in order to indicate improved strength and decreased fall risk. ALL LTG DUE 10/15/20    Baseline pt will continue to benefit from ongoing updates/additions    Time 12    Period Weeks    Status On-going    Target Date 10/15/20      PT LONG TERM GOAL #2   Title Patient will be able to ambulate >/= 1900 ft with to indicate improved endurance for community mobility and within age related normsl    Baseline 1513 ft, did not have time to perform on 09/17/20    Time 12    Period Weeks    Status On-going      PT LONG TERM GOAL #3   Title --    Baseline --    Time --    Period --    Status --      PT LONG TERM GOAL #4   Title Pt will improve gait speed with no AD to at least 3.6 ft/sec for improved community mobility    Baseline 3.3 ft/sec, 2.9 ft/sec  on 09/17/20    Time 12    Period Weeks    Status Revised      PT LONG TERM GOAL #5   Title Pt will ambulate at least 1000' outdoors  over grass surfaces with mod I in order to demo improved community mobility.    Baseline did not have time to perform on 09/17/20    Time 12    Period Weeks    Status On-going                   Plan - 10/08/20 1645     Clinical Impression Statement Pt recently got botox last Friday - reviewed ankle DF stretches and adding in new stretches today, with pt tolerating well. Remainder of session focused on stair training with no AD and balance on unlevel surfaces. Continued to use clinic L ankle ASO for ankle stability on compliant surfaces - pt to purchase her own as she has not been able to find her old one. Will continue to progress towards LTGs.    Personal Factors and Comorbidities Behavior Pattern;Past/Current Experience;Time since onset of injury/illness/exacerbation    Examination-Activity Limitations Bathing;Transfers;Bend;Carry;Reach Overhead;Stand;Locomotion Level;Stairs;Squat    Examination-Participation Restrictions Yard Work;Cleaning;Laundry;Community Activity;Meal Prep;Occupation    Stability/Clinical Decision Making Stable/Uncomplicated    Rehab Potential Good    PT Frequency  1x / week    PT Duration 12 weeks    PT Treatment/Interventions ADLs/Self Care Home Management;DME Instruction;Gait training;Stair training;Functional mobility training;Therapeutic activities;Therapeutic exercise;Balance training;Neuromuscular re-education;Patient/family education;Vestibular;Energy conservation;Electrical Stimulation;Passive range of motion;Manual techniques;Splinting;Orthotic Fit/Training;Aquatic Therapy    PT Next Visit Plan balance on compliant surfaces and outdoors if weather isn't so hot. use L ASO for ankle stability. SLS tasks on LLE.  Weight shifting, lunges. step ups. DF ROM with LLE. half and tall kneeling activities.    Consulted and Agree with Plan of Care Patient             Patient will benefit from skilled therapeutic intervention in order to improve the following  deficits and impairments:  Decreased activity tolerance, Decreased balance, Decreased endurance, Decreased mobility, Decreased strength, Postural dysfunction, Abnormal gait, Difficulty walking, Impaired tone, Decreased range of motion, Decreased coordination, Impaired UE functional use, Pain, Impaired flexibility  Visit Diagnosis: Muscle weakness (generalized)  Unsteadiness on feet  Other abnormalities of gait and mobility     Problem List Patient Active Problem List   Diagnosis Date Noted   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   Major depressive disorder, recurrent episode, severe (HCC) 06/21/2019   Postcoital UTI 05/24/2019   Spastic hemiparesis (HCC)    Vascular headache    Mood disorder in conditions classified elsewhere    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 [place], [place], [address] Phone: [phone]   Fax:  [phone]  Name: Jane Watkins MRN: [id number] Date of Birth: [birth year]

## 2020-10-08 NOTE — Therapy (Signed)
[facility] [address] [place], [place], [address] Phone: [phone]   Fax:  [phone]  Occupational Therapy Treatment  Patient Details  Name: Jane Watkins MRN: [id number] Date of Birth: 1995-08-21 Referring Provider (OT): Sula Soda   Encounter Date: 10/08/2020   OT End of Session - 10/08/20 1321     Visit Number 10    Number of Visits 13    Date for OT Re-Evaluation 69/62/95   cert period end date   Authorization Type UHC 2022    Authorization Time Period Eval Date 07/24/2020  VL: 60OT (separate from 60PT)    OT Start Time 1318    OT Stop Time 1357    OT Time Calculation (min) 39 min    Activity Tolerance Patient tolerated treatment well    Behavior During Therapy [facility] for tasks assessed/performed             Past Medical History:  Diagnosis Date   AKI (acute kidney injury) (HCC) 2018   "from overdose"   Anxiety    Chlamydia    Daily headache    Depression    Drug overdose 04/2016   [name] 05/01/2016   GERD (gastroesophageal reflux disease)    "when I was younger; gone now" (09/21/2017)   IV drug abuse (HCC) 09/20/2017   Migraine    "q couple weeks" (09/21/2017)   Opioid abuse (HCC)    Overdose    Stroke [facility])     Past Surgical History:  Procedure Laterality Date   APPENDECTOMY  04/2013   I & D EXTREMITY Left 09/20/2017   Procedure: IRRIGATION AND DEBRIDEMENT LEFT ARM;  Surgeon: Bradly Bienenstock, MD;  Location: [facility] OR;  Service: Orthopedics;  Laterality: Left;   TEE WITHOUT CARDIOVERSION N/A 02/10/2019   Procedure: TRANSESOPHAGEAL ECHOCARDIOGRAM (TEE);  Surgeon: Debbe Odea, MD;  Location: [facility] ORS;  Service: Cardiovascular;  Laterality: N/A;  Polysubstance abuser   TONGUE SURGERY  ~ 2007   "related to lisp"   TRACHEOSTOMY  04/2016   [name] 05/01/2016    There were no vitals filed for this visit.   Subjective Assessment - 10/08/20 1320     Subjective  "i got botox - is it true  that getting botox overtime can be not beneficial?" "I had a fall wednesday"    Pertinent History Per chart. Pt. is a 25 y.o.female with a history of IV drug use, polysubstance as well as tobacco abuse, anxiety. Pt. presented to [facility] on 02/07/2019 after being found unresponsive.  EMS contacted patient received Narcan with some improvement in mental status.  Admission labs with WBC 20,700, potassium 5.2, BUN 25, creatinine 0.67, urine drug screen positive cocaine, marijuana and benzodiazepines, alcohol level less than 10, SARS coronavirus negative.  Cranial CT scan showed areas of indistinct low-density in the right basal ganglia with mass-effect on the right lateral ventricle.  Patient did not receive TPA.  MRI showed acute infarction of right MCA territory, affecting the basal ganglia and affecting the cortical and subcortical brain in a largely watershed distribution. Pt. had a previous inpatient rehab admission 05/01/2016 to 05/09/2016 for hypoxic encephalopathy after drug overdose requiring tracheostomy, and was later decannulated. Pt. resides with her boyfriend in an apartment. Pt. worked full time as a Production designer, theatre/television/film at [employer]. pt. enjoys travelling.    Patient Stated Goals Patient reports she would like to be as independent as possible with all her daily tasks.    Currently in Pain? No/denies  OT Treatments/Exercises (OP) - 10/08/20 0001       ADLs   Overall ADLs pt reports turning lights on/off with LUE, opening straight handle doors, holding vapt, stabilization of objects      Neurological Re-education Exercises   Other Exercises 1 color dowel pegs iwth LUE with grasp and release - pt with increased control of grasp and release    Other Exercises 2 reviewed HEPs for table slides and self PROM for stretches    Other Weight-Bearing Exercises 1 weight bearing on forarms and hands with anterior/posterior shifts and lateral shifts on elevated mat table                       OT Short Term Goals - 08/23/20 1748       OT SHORT TERM GOAL #1   Title Pt will be independent with HEP    Time 4    Period Weeks    Status Achieved    Target Date 08/21/20      OT SHORT TERM GOAL #2   Title Pt will demonstrate tying bilateral shoes with one handed technique, bilateral hands or with adapted equipment PRN.    Time 4    Period Weeks    Status Achieved      OT SHORT TERM GOAL #3   Title Pt will perform Box and Blocks with LUE with score of 6 or greater with divider for increase in functional use of LUE.    Baseline LUE 4 (without divider) RUE 49.  08/23/20 - 8 BLOCKS    Time 4    Period Weeks    Status Achieved   08/14/20 - practiced grasp/release of blocks     OT SHORT TERM GOAL #4   Title Pt will verbalize understanding of adapted strategies and/or equipment PRN for increased safety and independence with ADLs and IADLs (cutting up vegetables and food, tying shoes, clasping bra, etc)    Time 4    Period Weeks    Status Achieved               OT Long Term Goals - 08/23/20 1853       OT LONG TERM GOAL #1   Title Pt will be independent with updated HEP    Time 12    Period Weeks    Status On-going      OT LONG TERM GOAL #2   Title Pt will demonstrate increased active shoulder flexion to 130 degrees with min compensations for preparing for reaching to comb hair and assistance styling, etc.    Baseline 130 PROM 110 AROM    Time 12    Period Weeks    Status On-going      OT LONG TERM GOAL #3   Title Pt will perform Box and Blocks with score of 10 or greater with LUE with divider in order to increase functional use and grasp/release with LUE.    Baseline 4 L (without divider) 49 R    Time 12    Period Weeks    Status On-going      OT LONG TERM GOAL #4   Title Pt will increase grip strength in LUE to 15 lbs or greater for increasing functional use of LUE    Baseline L 8.8 R 57.9    Time 12    Period Weeks    Status  On-going      OT LONG TERM GOAL #5   Title Pt will demonstrate  ability to style hair with adapted strateiges and/or equipment PRN.    Time 12    Period Weeks    Status On-going   08/14/20 - reports completing this w/ Mod I at home     OT LONG TERM GOAL #6   Title Pt will be independent with any wear and care of splints and/or orthoses PRN    Time 12    Period Weeks    Status On-going                   Plan - 10/08/20 1352     Clinical Impression Statement Pt to schedule another session to make up missed session and go over goals, HEPs and discharge.    OT Occupational Profile and History Detailed Assessment- Review of Records and additional review of physical, cognitive, psychosocial history related to current functional performance    Occupational performance deficits (Please refer to evaluation for details): ADL's;IADL's    Body Structure / Function / Physical Skills ADL;IADL;Coordination;Endurance;UE functional use;Decreased knowledge of precautions;Dexterity;FMC;ROM;Tone;Strength;GMC;Decreased knowledge of use of DME    Rehab Potential Good    Clinical Decision Making Several treatment options, min-mod task modification necessary    Comorbidities Affecting Occupational Performance: May have comorbidities impacting occupational performance    Modification or Assistance to Complete Evaluation  Min-Moderate modification of tasks or assist with assess necessary to complete eval    OT Frequency 1x / week    OT Duration 12 weeks   1x/week for 12 weeks   OT Treatment/Interventions Self-care/ADL training;DME and/or AE instruction;Energy conservation;Therapeutic activities;Patient/family education;Therapeutic exercise;Neuromuscular education;Aquatic Therapy;Moist Heat;Electrical Stimulation;Ultrasound;Paraffin;Functional Mobility Training;Traction;Splinting;Fluidtherapy;Cognitive remediation/compensation;Passive range of motion;Manual Therapy    Plan weight bearing, check goals, print  out HEPs/stretches and discharge    Consulted and Agree with Plan of Care Patient             Patient will benefit from skilled therapeutic intervention in order to improve the following deficits and impairments:   Body Structure / Function / Physical Skills: ADL, IADL, Coordination, Endurance, UE functional use, Decreased knowledge of precautions, Dexterity, FMC, ROM, Tone, Strength, GMC, Decreased knowledge of use of DME       Visit Diagnosis: Muscle weakness (generalized)  Other abnormalities of gait and mobility  Unsteadiness on feet  Spastic hemiplegia of left dominant side as late effect of cerebral infarction [facility])  Other lack of coordination  Attention and concentration deficit  Frontal lobe and executive function deficit    Problem List Patient Active Problem List   Diagnosis Date Noted   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   Major depressive disorder, recurrent episode, severe (HCC) 06/21/2019   Postcoital UTI 05/24/2019   Spastic hemiparesis (HCC)    Vascular headache    Mood disorder in conditions classified elsewhere    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 [place], [place], [address] Phone: [phone]   Fax:  [phone]  Name: Jane Watkins MRN: [id number] Date of Birth: 05-18-1995

## 2020-10-08 NOTE — Patient Instructions (Signed)
Access Code: [email or web address] URL: [email or web address] Date: 10/08/2020 Prepared by: Sherlie Ban  Exercises Supine Ankle Dorsiflexion Stretch with Caregiver - 1 x daily - 7 x weekly - 1 sets - 3 reps - 30 - 60 second hold hold Standing Gastroc Stretch on Step with Counter Support - 1 x daily - 7 x weekly - 3 sets - 3 reps - 30 seconds hold Single Leg Bridge - 1 x daily - 7 x weekly - 2 sets - 10 reps Squatting Lateral Monster Walk and Counter Support - 1 x daily - 7 x weekly - 1 sets - 3 reps Standing Marching - 2 x daily - 5 x weekly - 3 sets Forward Backward Monster Walk with Band at Thighs and Counter Support - 2 x daily - 5 x weekly - 3 sets Supine Single Knee to Chest Stretch - 1 x daily - 5 x weekly - 3 sets - 10 reps Long Sitting Hamstring Stretch - 1 x daily - 5 x weekly - 3 sets - 30 reps Standing Ankle Dorsiflexion Stretch on Chair - 1 x daily - 5 x weekly - 3 sets - 15-10 hold

## 2020-10-09 ENCOUNTER — Ambulatory Visit: Payer: 59 | Admitting: Obstetrics and Gynecology

## 2020-10-19 ENCOUNTER — Ambulatory Visit: Payer: 59 | Admitting: Physical Therapy

## 2020-10-22 ENCOUNTER — Ambulatory Visit: Payer: 59 | Admitting: Occupational Therapy

## 2020-10-26 ENCOUNTER — Encounter: Payer: Self-pay | Admitting: Occupational Therapy

## 2020-10-26 ENCOUNTER — Ambulatory Visit: Payer: 59 | Admitting: Occupational Therapy

## 2020-10-26 ENCOUNTER — Ambulatory Visit: Payer: 59 | Attending: Physical Medicine and Rehabilitation

## 2020-10-26 ENCOUNTER — Other Ambulatory Visit: Payer: Self-pay

## 2020-10-26 DIAGNOSIS — R41844 Frontal lobe and executive function deficit: Secondary | ICD-10-CM

## 2020-10-26 DIAGNOSIS — R2689 Other abnormalities of gait and mobility: Secondary | ICD-10-CM

## 2020-10-26 DIAGNOSIS — I69352 Hemiplegia and hemiparesis following cerebral infarction affecting left dominant side: Secondary | ICD-10-CM

## 2020-10-26 DIAGNOSIS — M6281 Muscle weakness (generalized): Secondary | ICD-10-CM

## 2020-10-26 DIAGNOSIS — R41840 Attention and concentration deficit: Secondary | ICD-10-CM | POA: Insufficient documentation

## 2020-10-26 DIAGNOSIS — R278 Other lack of coordination: Secondary | ICD-10-CM | POA: Insufficient documentation

## 2020-10-26 DIAGNOSIS — R2681 Unsteadiness on feet: Secondary | ICD-10-CM | POA: Diagnosis present

## 2020-10-26 NOTE — Therapy (Signed)
[facility] [address] [place], [place], [address] Phone: [phone]   Fax:  [phone]  Occupational Therapy Treatment & Discharge  Patient Details  Name: Jane Watkins MRN: [id number] Date of Birth: 22-May-1995 Referring Provider (OT): Leeroy Cha   Encounter Date: 10/26/2020   OT End of Session - 10/26/20 1451     Visit Number 11    Number of Visits 13    Date for OT Re-Evaluation 24/23/53   cert period end date   Authorization Type UHC 2022    Authorization Time Period Eval Date 07/24/2020  VL: 60OT (separate from 60PT)    OT Start Time 1448    OT Stop Time 1522   d/c session   OT Time Calculation (min) 34 min    Activity Tolerance Patient tolerated treatment well    Behavior During Therapy [facility] for tasks assessed/performed             Past Medical History:  Diagnosis Date   AKI (acute kidney injury) ([place]) 2018   "from overdose"   Anxiety    Chlamydia    Daily headache    Depression    Drug overdose 04/2016   [name] 05/01/2016   GERD (gastroesophageal reflux disease)    "when I was younger; gone now" (09/21/2017)   IV drug abuse ([place]) 09/20/2017   Migraine    "q couple weeks" (09/21/2017)   Opioid abuse ([place])    Overdose    Stroke [facility])     Past Surgical History:  Procedure Laterality Date   APPENDECTOMY  04/2013   I & D EXTREMITY Left 09/20/2017   Procedure: IRRIGATION AND DEBRIDEMENT LEFT ARM;  Surgeon: [name], MD;  Location: [facility];  Service: Orthopedics;  Laterality: Left;   TEE WITHOUT CARDIOVERSION N/A 02/10/2019   Procedure: TRANSESOPHAGEAL ECHOCARDIOGRAM (TEE);  Surgeon: [name], MD;  Location: [facility];  Service: Cardiovascular;  Laterality: N/A;  Polysubstance abuser   TONGUE SURGERY  ~ 2007   "related to lisp"   TRACHEOSTOMY  04/2016   [name] 05/01/2016    There were no vitals filed for this visit.   Subjective Assessment - 10/26/20 1449     Subjective   People in my house got covid but I never got any symptoms.    Pertinent History Per chart. Pt. is a 25 y.o.female with a history of IV drug use, polysubstance as well as tobacco abuse, anxiety. Pt. presented to [facility] on 02/07/2019 after being found unresponsive.  EMS contacted patient received Narcan with some improvement in mental status.  Admission labs with WBC 20,700, potassium 5.2, BUN 25, creatinine 0.67, urine drug screen positive cocaine, marijuana and benzodiazepines, alcohol level less than 10, SARS coronavirus negative.  Cranial CT scan showed areas of indistinct low-density in the right basal ganglia with mass-effect on the right lateral ventricle.  Patient did not receive TPA.  MRI showed acute infarction of right MCA territory, affecting the basal ganglia and affecting the cortical and subcortical brain in a largely watershed distribution. Pt. had a previous inpatient rehab admission 05/01/2016 to 05/09/2016 for hypoxic encephalopathy after drug overdose requiring tracheostomy, and was later decannulated. Pt. resides with her boyfriend in an apartment. 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    Patient Stated Goals Patient reports she would like to be as independent as possible with all her daily tasks.    Currently in Pain? Yes    Pain Score 7  Pain Location Foot    Pain Orientation Left    Pain Descriptors / Indicators Sore;Aching;Shooting    Pain Type Acute pain    Pain Onset More than a month ago    Pain Frequency Constant    Aggravating Factors  said it's getting worse    Pain Relieving Factors nothing                     OCCUPATIONAL THERAPY DISCHARGE SUMMARY  Visits from Start of Care: 11  Current functional level related to goals / functional outcomes: Pt progressed with understanding of HEPs and exercises for increasing functional use of LUE. Pt demonstrated improved range of motion and overall use but did not score well with  goals.    Remaining deficits: Continues with overall hemiplegia with LUE.    Education / Equipment: HEPs for LUE   Patient agrees to discharge. Patient goals were partially met. Patient is being discharged due to maximized rehab potential. .                OT Education - 10/26/20 1507     Education Details Reviewed stretches and exercises - printed out for patient. Access Code: [email or web address] - Discussed discharge instructions.    Person(s) Educated Patient    Methods Explanation;Demonstration;Handout    Comprehension Verbalized understanding;Returned demonstration              OT Short Term Goals - 08/23/20 1748       OT SHORT TERM GOAL #1   Title Pt will be independent with HEP    Time 4    Period Weeks    Status Achieved    Target Date 08/21/20      OT SHORT TERM GOAL #2   Title Pt will demonstrate tying bilateral shoes with one handed technique, bilateral hands or with adapted equipment PRN.    Time 4    Period Weeks    Status Achieved      OT SHORT TERM GOAL #3   Title Pt will perform Box and Blocks with LUE with score of 6 or greater with divider for increase in functional use of LUE.    Baseline LUE 4 (without divider) RUE 49.  08/23/20 - 8 BLOCKS    Time 4    Period Weeks    Status Achieved   08/14/20 - practiced grasp/release of blocks     OT SHORT TERM GOAL #4   Title Pt will verbalize understanding of adapted strategies and/or equipment PRN for increased safety and independence with ADLs and IADLs (cutting up vegetables and food, tying shoes, clasping bra, etc)    Time 4    Period Weeks    Status Achieved               OT Long Term Goals - 10/26/20 1454       OT LONG TERM GOAL #1   Title Pt will be independent with updated HEP    Time 12    Period Weeks    Status Achieved      OT LONG TERM GOAL #2   Title Pt will demonstrate increased active shoulder flexion to 130 degrees with min compensations for preparing for reaching to comb  hair and assistance styling, etc.    Baseline 130 PROM 110 AROM    Time 12    Period Weeks    Status Achieved   132* AROM LUE shoulder flexion     OT LONG TERM GOAL #3  Title Pt will perform Box and Blocks with score of 10 or greater with LUE with divider in order to increase functional use and grasp/release with LUE.    Baseline 4 L (without divider) 49 R    Time 12    Period Weeks    Status Not Met   10/26/20 2 blocks     OT LONG TERM GOAL #4   Title Pt will increase grip strength in LUE to 15 lbs or greater for increasing functional use of LUE    Baseline L 8.8 R 57.9    Time 12    Period Weeks    Status Not Met   10/26/20 L 8.5lbs     OT LONG TERM GOAL #5   Title Pt will demonstrate ability to style hair with adapted strateiges and/or equipment PRN.    Time 12    Period Weeks    Status Not Met   08/14/20 - reports completing this w/ Mod I at home. 10/26/20 Pt is not doing this. Pt had hair up this day and reported boyfriend putting it up for her.     OT LONG TERM GOAL #6   Title Pt will be independent with any wear and care of splints and/or orthoses PRN    Time 12    Period Weeks    Status Achieved                   Plan - 10/26/20 1511     Clinical Impression Statement Pt is ready to discharge at this time. Discussion with patient re: discharge instructions and returning in 6 months or sooner if warranted. Pt verbalized understanding of needing to obtain new referral if needing more therapy.    OT Occupational Profile and History Detailed Assessment- Review of Records and additional review of physical, cognitive, psychosocial history related to current functional performance    Occupational performance deficits (Please refer to evaluation for details): ADL's;IADL's    Body Structure / Function / Physical Skills ADL;IADL;Coordination;Endurance;UE functional use;Decreased knowledge of precautions;Dexterity;FMC;ROM;Tone;Strength;GMC;Decreased knowledge of use of DME     Rehab Potential Good    Clinical Decision Making Several treatment options, min-mod task modification necessary    Comorbidities Affecting Occupational Performance: May have comorbidities impacting occupational performance    Modification or Assistance to Complete Evaluation  Min-Moderate modification of tasks or assist with assess necessary to complete eval    OT Frequency 1x / week    OT Duration 12 weeks   1x/week for 12 weeks   OT Treatment/Interventions Self-care/ADL training;DME and/or AE instruction;Energy conservation;Therapeutic activities;Patient/family education;Therapeutic exercise;Neuromuscular education;Aquatic Therapy;Moist Heat;Electrical Stimulation;Ultrasound;Paraffin;Functional Mobility Training;Traction;Splinting;Fluidtherapy;Cognitive remediation/compensation;Passive range of motion;Manual Therapy    Plan OT discharge    Consulted and Agree with Plan of Care Patient             Patient will benefit from skilled therapeutic intervention in order to improve the following deficits and impairments:   Body Structure / Function / Physical Skills: ADL, IADL, Coordination, Endurance, UE functional use, Decreased knowledge of precautions, Dexterity, FMC, ROM, Tone, Strength, GMC, Decreased knowledge of use of DME       Visit Diagnosis: Muscle weakness (generalized)  Other abnormalities of gait and mobility  Unsteadiness on feet  Spastic hemiplegia of left dominant side as late effect of cerebral infarction [facility])  Other lack of coordination  Attention and concentration deficit  Frontal lobe and executive function deficit    Problem List Patient Active Problem List   Diagnosis Date Noted  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   Major depressive disorder, recurrent episode, severe ([place]) 06/21/2019   Postcoital UTI 05/24/2019   Spastic hemiparesis ([facility])    Vascular headache    Mood disorder in conditions classified elsewhere    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   Polysubstance dependence in early, early partial, sustained full, or sustained partial remission ([place])    Dyslipidemia    Ischemic stroke diagnosed during current admission ([place]) 02/13/2019   MDD (major depressive disorder), recurrent episode, moderate ([place]) 02/10/2019   CVA (cerebral vascular accident) ([place]) 02/07/2019   TBI (traumatic brain injury) ([place]) 05/01/2016   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

## 2020-10-26 NOTE — Patient Instructions (Signed)
Access Code: [email or web address] URL: [email or web address] Date: 10/26/2020 Prepared by: Jethro Bastos  Exercises Supine Ankle Dorsiflexion Stretch with Caregiver - 1 x daily - 7 x weekly - 1 sets - 3 reps - 30 - 60 second hold hold Standing Gastroc Stretch on Step with Counter Support - 1 x daily - 7 x weekly - 3 sets - 3 reps - 30 seconds hold Single Leg Bridge - 1 x daily - 7 x weekly - 2 sets - 10 reps Squatting Lateral Monster Walk and Counter Support - 1 x daily - 7 x weekly - 1 sets - 3 reps Standing Marching - 2 x daily - 5 x weekly - 3 sets Forward Backward Monster Walk with Band at Thighs and Counter Support - 2 x daily - 5 x weekly - 3 sets Supine Single Knee to Chest Stretch - 1 x daily - 5 x weekly - 3 sets - 10 reps Long Sitting Hamstring Stretch - 1 x daily - 5 x weekly - 3 sets - 30 reps Standing Ankle Dorsiflexion Stretch on Chair - 1 x daily - 5 x weekly - 3 sets - 15-10 hold

## 2020-10-26 NOTE — Therapy (Signed)
[facility] [address] [place], [place], [address] Phone: [phone]   Fax:  [phone]  Physical Therapy Treatment/Discharge Summary  Patient Details  Name: Jane Watkins MRN: [id number] Date of Birth: 05/21/1995 Referring Provider (PT): Raulkar, Clide Deutscher, MD  PHYSICAL THERAPY DISCHARGE SUMMARY  Visits from Start of Care: 12  Current functional level related to goals / functional outcomes: See Clinical Impression   Remaining deficits: Abnormal Gait   Education / Equipment: HEP provided   Patient agrees to discharge. Patient goals were partially met. Patient is being discharged due to meeting the stated rehab goals.   Encounter Date: 10/26/2020   PT End of Session - 10/26/20 1523     Visit Number 12    Number of Visits 13    Date for PT Re-Evaluation 10/27/20    Authorization Type UHC, 60 visit limit per discipline    Authorization - Visit Number 12    Authorization - Number of Visits 60    PT Start Time 1520    PT Stop Time 1552    PT Time Calculation (min) 32 min    Activity Tolerance Patient tolerated treatment well    Behavior During Therapy WFL for tasks assessed/performed             Past Medical History:  Diagnosis Date   AKI (acute kidney injury) ([place]) 2018   "from overdose"   Anxiety    Chlamydia    Daily headache    Depression    Drug overdose 04/2016   [name] 05/01/2016   GERD (gastroesophageal reflux disease)    "when I was younger; gone now" (09/21/2017)   IV drug abuse ([place]) 09/20/2017   Migraine    "q couple weeks" (09/21/2017)   Opioid abuse ([place])    Overdose    Stroke [facility])     Past Surgical History:  Procedure Laterality Date   APPENDECTOMY  04/2013   I & D EXTREMITY Left 09/20/2017   Procedure: IRRIGATION AND DEBRIDEMENT LEFT ARM;  Surgeon: [name], MD;  Location: [facility];  Service: Orthopedics;  Laterality: Left;   TEE WITHOUT CARDIOVERSION N/A 02/10/2019    Procedure: TRANSESOPHAGEAL ECHOCARDIOGRAM (TEE);  Surgeon: [name], MD;  Location: [facility];  Service: Cardiovascular;  Laterality: N/A;  Polysubstance abuser   TONGUE SURGERY  ~ 2007   "related to lisp"   TRACHEOSTOMY  04/2016   [name] 05/01/2016    There were no vitals filed for this visit.   Subjective Assessment - 10/26/20 1521     Subjective Reports she is still trying to find an apartment. Patient reports no falls. No other new changes. Is having some pain in the L Leg.    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    Limitations Walking;Lifting;House hold activities;Standing    How long can you walk comfortably? 1 hour    Patient Stated Goals wants to try some electrical stimulation, improve her walking    Currently in Pain? Yes    Pain Score 3     Pain Location Leg    Pain Orientation Left    Pain Descriptors / Indicators Aching;Shooting;Sore    Pain Type Acute pain    Pain Onset More than a month ago  [facility] PT Assessment - 10/26/20 0001       Assessment   Medical Diagnosis CVA    Referring Provider (PT) Raulkar, Clide Deutscher, MD      6 Minute Walk- Baseline   6 Minute Walk- Baseline yes    BP (mmHg) 108/67    HR (bpm) 75    Modified Borg Scale for Dyspnea 0- Nothing at all      6 Minute walk- Post Test   6 Minute Walk Post Test yes    BP (mmHg) 115/79    HR (bpm) 79    Modified Borg Scale for Dyspnea 0- Nothing at all    Perceived Rate of Exertion (Borg) 10-      6 minute walk test results    Aerobic Endurance Distance Walked 1373   outdoors on paved surfaces; no AD, Mod I             [facility] Adult PT Treatment/Exercise - 10/26/20 0001       Transfers   Transfers Sit to Stand;Stand to Sit    Sit to Stand 6: Modified independent  (Device/Increase time)    Stand to Sit 6: Modified independent (Device/Increase time)      Ambulation/Gait   Ambulation/Gait Yes    Ambulation/Gait Assistance 6: Modified independent (Device/Increase time)    Ambulation/Gait Assistance Details continued cues for heel strike intermittently. no imbalance noted.    Ambulation Distance (Feet) 1373 Feet    Assistive device None    Gait Pattern Step-through pattern;Decreased stride length;Lateral hip instability;Poor foot clearance - left;Left circumduction;Decreased weight shift to left;Decreased dorsiflexion - left;Decreased stance time - left;Decreased arm swing - right;Decreased arm swing - left    Ambulation Surface Level;Indoor;Unlevel;Outdoor;Paved    Gait velocity 8.9 seconds = 3.68 ft/sec    Gait Comments with completion of 6MWT      Exercises   Exercises Other Exercises    Other Exercises  reviewed HEP (see medbridge)            Reviewed HEP:  Access Code: [email or web address] URL: [email or web address] Date: 10/26/2020 Prepared by: Baldomero Lamy  Exercises Supine Ankle Dorsiflexion Stretch with Caregiver - 1 x daily - 7 x weekly - 1 sets - 3 reps - 30 - 60 second hold hold Standing Gastroc Stretch on Step with Counter Support - 1 x daily - 7 x weekly - 3 sets - 3 reps - 30 seconds hold Single Leg Bridge - 1 x daily - 7 x weekly - 2 sets - 10 reps Squatting Lateral Monster Walk and Counter Support - 1 x daily - 7 x weekly - 1 sets - 3 reps Standing Marching - 2 x daily - 5 x weekly - 3 sets Forward Backward Monster Walk with Band at Thighs and Counter Support - 2 x daily - 5 x weekly - 3 sets Supine Single Knee to Chest Stretch - 1 x daily - 5 x weekly - 3 sets - 10 reps Long Sitting Hamstring Stretch - 1 x daily - 5 x weekly - 3 sets - 30 reps Standing Ankle Dorsiflexion Stretch on Chair - 1 x daily - 5 x weekly - 3 sets - 15-10 hold          PT Education - 10/26/20 1551     Education provided Yes     Education Details Progress toward LTGs; HEP Review    Person(s) Educated Patient    Methods Explanation    Comprehension Verbalized understanding  PT Short Term Goals - 08/23/20 1731       PT SHORT TERM GOAL #1   Title Pt will be independent with initial HEP in order to indicate improved strength and decreased fall risk. ALL STGS DUE 08/21/20    Baseline reports independence with HEP, completed 3-4x/week    Time 4    Period Weeks    Status Achieved    Target Date 08/21/20      PT SHORT TERM GOAL #2   Title Pt will undergo further assessment of 6MWT with LTG written as appropriate in order to demo improved endurance for community mobility.    Baseline 1513 ft    Time 4    Period Weeks    Status Achieved      PT SHORT TERM GOAL #3   Title Pt will improve FGA score to at least a 22/30 in order to demo decr fall risk.    Baseline 20/30; 22/30    Time 4    Period Weeks    Status Achieved               PT Long Term Goals - 10/26/20 1538       PT LONG TERM GOAL #1   Title Pt will be independent with final HEP in order to indicate improved strength and decreased fall risk.    Baseline pt will continue to benefit from ongoing updates/additions; reports independence with HEP    Time 12    Period Weeks    Status Achieved      PT LONG TERM GOAL #2   Title Patient will be able to ambulate >/= 1900 ft with 6MWT to indicate improved endurance for community mobility and within age related normsl    Baseline 1513 ft, did not have time to perform on 09/17/20; 1373 ft outdoors surfaces    Time 12    Period Weeks    Status Not Met      PT LONG TERM GOAL #4   Title Pt will improve gait speed with no AD to at least 3.6 ft/sec for improved community mobility    Baseline 3.3 ft/sec, 2.9 ft/sec  on 09/17/20; 3.45 ft/sec; 3.68 ft/sec    Time 12    Period Weeks    Status Achieved      PT LONG TERM GOAL #5   Title Pt will ambulate at least 1000' outdoors over grass  surfaces with mod I in order to demo improved community mobility.    Baseline did not have time to perform on 09/17/20; 1373 ft outdoors unlevel surfaces Mod I    Time 12    Period Weeks    Status Achieved                   Plan - 10/26/20 1607     Clinical Impression Statement Completed assesment of patient's progress toward LTG. Patient able to meet LTG #1, 4, and 5 demonstrating improvements with PT services. Improved balance on outdoors surfaces, minimal improvements in gait distance but reduced reports of SOB and fatigue demo improved endurance with long distance ambulation. Reviewed HEP with patient. Patient demo readiness to d/c at this time, with patient and PT agreeing.    Personal Factors and Comorbidities Behavior Pattern;Past/Current Experience;Time since onset of injury/illness/exacerbation    Examination-Activity Limitations Bathing;Transfers;Bend;Carry;Reach Overhead;Stand;Locomotion Level;Stairs;Squat    Examination-Participation Restrictions Yard Work;Cleaning;Laundry;Community Activity;Meal Prep;Occupation    Stability/Clinical Decision Making Stable/Uncomplicated    Rehab Potential Good    PT Frequency  1x / week    PT Duration 12 weeks    PT Treatment/Interventions ADLs/Self Care Home Management;DME Instruction;Gait training;Stair training;Functional mobility training;Therapeutic activities;Therapeutic exercise;Balance training;Neuromuscular re-education;Patient/family education;Vestibular;Energy conservation;Electrical Stimulation;Passive range of motion;Manual techniques;Splinting;Orthotic Fit/Training;Aquatic Therapy    Consulted and Agree with Plan of Care Patient             Patient will benefit from skilled therapeutic intervention in order to improve the following deficits and impairments:  Decreased activity tolerance, Decreased balance, Decreased endurance, Decreased mobility, Decreased strength, Postural dysfunction, Abnormal gait, Difficulty walking,  Impaired tone, Decreased range of motion, Decreased coordination, Impaired UE functional use, Pain, Impaired flexibility  Visit Diagnosis: Muscle weakness (generalized)  Other abnormalities of gait and mobility  Unsteadiness on feet     Problem List Patient Active Problem List   Diagnosis Date Noted   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   Major depressive disorder, recurrent episode, severe ([place]) 06/21/2019   Postcoital UTI 05/24/2019   Spastic hemiparesis ([facility])    Vascular headache    Mood disorder in conditions classified elsewhere    Right middle cerebral artery stroke ([place]) 02/16/2019   Polysubstance dependence in early, early partial, sustained full, or sustained partial remission ([place])    Dyslipidemia    Ischemic stroke diagnosed during current admission ([place]) 02/13/2019   MDD (major depressive disorder), recurrent episode, moderate ([place]) 02/10/2019   CVA (cerebral vascular accident) ([place]) 02/07/2019   TBI (traumatic brain injury) ([place]) 05/01/2016   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 [place], [place], [address] Phone: [phone]   Fax:  [phone]  Name: Jane Watkins MRN: [id number] Date of Birth: [birth year]

## 2020-10-26 NOTE — Patient Instructions (Signed)
Access Code: [email or web address] URL: [email or web address] Date: 10/26/2020 Prepared by: Kallie Edward  Exercises Wrist Extension Self Stretch - 1 x daily - 7 x weekly - 3 sets - 10 reps Hand PROM Finger Extension - 1 x daily - 7 x weekly - 3 sets - 10 reps Seated Wrist Supination Stretch - 1 x daily - 7 x weekly - 3 sets - 10 reps Hand PROM Finger Extension - 1 x daily - 7 x weekly - 3 sets - 10 reps Seated Elbow Extension and Shoulder External Rotation AAROM at Table with Towel - 1 x daily - 7 x weekly - 3 sets - 10 reps Seated Shoulder Flexion Towel Slide at Table Top - 1 x daily - 7 x weekly - 3 sets - 10 reps Seated Shoulder Flexion Self PROM - 1 x daily - 7 x weekly - 3 sets - 10 reps

## 2020-10-30 ENCOUNTER — Telehealth: Payer: Self-pay

## 2020-10-30 NOTE — Telephone Encounter (Signed)
Noted, appreciate triage support 

## 2020-10-30 NOTE — Telephone Encounter (Signed)
Unable to reach pt by phone, per DPR pt does not give permission to talk with anyone else;per access note not sure what is bothering pt. I called back and got pt; pt said she is doing a virtual visit now and after that is going to [facility] on [address]; pt said that she is having both bladder issues and foot issues. Pt has hx of stroke. Sending note to Dr [name].

## 2020-10-30 NOTE — Telephone Encounter (Signed)
[facility] Two Day - Client TELEPHONE ADVICE RECORD [facility] Patient Name: Jane Watkins Gender: Female DOB: 02-May-1995 Age: 25 Y 3 M 12 D Return Phone Number: [phone] (Primary) Address: City/ State/ Zip: [place] [address] Client [facility] Day - Client Client Site  [facility] - Day Physician Gweneth Dimitri- MD Contact Type Call Who Is Calling Patient / Member / Family / Caregiver Call Type Triage / Clinical Relationship To Patient Self Return Phone Number [phone] (Primary) Chief Complaint NUMBNESS/TINGLING- sudden on one side of the body or face Reason for Call Symptomatic / Request for Health Information Initial Comment Irving Burton from the office transferring call no appts today caller has bladder pain with odor and lower abdomen pain / she had a stroke in 2020 on the left side and now having pain on that same side like before -- stabbing pain GOTO Facility Not Listed [facility] Translation No Nurse Assessment Nurse: Lorenz Coaster, RN, Gregary Signs Date/Time (Eastern Time): 10/30/2020 1:57:26 PM Confirm and document reason for call. If symptomatic, describe symptoms. ---Caller states having same symptoms as the last time had a stroke. Currently symptoms is on the ball of foot, when takes step there is a stabbing pain, sometimes pain shoots up to hip, has weakness in ankle and numbness on top of thigh. Does the patient have any new or worsening symptoms? ---Yes Will a triage be completed? ---Yes Related visit to physician within the last 2 weeks? ---No Does the PT have any chronic conditions? (i.e. diabetes, asthma, this includes High risk factors for pregnancy, etc.) ---Yes List chronic conditions. ---Stroke, Depression, Anxiety, PTSD. Is the patient pregnant or possibly pregnant? (Ask all females between the ages of 19-55) ---No Is this a behavioral health or substance abuse call?  ---No Guidelines Guideline Title Affirmed Question Affirmed Notes Nurse Date/Time (Eastern Time) Neurologic Deficit [1] Weakness of the face, arm / hand, or Baxter, RN, Gregary Signs 10/30/2020 1:59:31 PM PLEASE NOTE: All timestamps contained within this report are represented as [place] Standard Time. CONFIDENTIALTY NOTICE: This fax transmission is intended only for the addressee. It contains information that is legally privileged, confidential or otherwise protected from use or disclosure. If you are not the intended recipient, you are strictly prohibited from reviewing, disclosing, copying using or disseminating any of this information or taking any action in reliance on or regarding this information. If you have received this fax in error, please notify us immediately by telephone so that we can arrange for its return to [place]. Phone: [phone], Toll-Free: [phone], Fax: [phone] Page: 2 of 2 Call Id: [id number] Guidelines Guideline Title Affirmed Question Affirmed Notes Nurse Date/Time Lamount Cohen Time) leg / foot on one side of the body AND [2] gradual onset (e.g., days to weeks) AND [3] present now Disp. Time Lamount Cohen Time) Disposition Final User 10/30/2020 1:51:01 PM Send to Urgent Michela Pitcher, Lanette 10/30/2020 2:08:30 PM See HCP within 4 Hours (or PCP triage) Yes Baxter, RN, Gregary Signs Caller Disagree/Comply Comply Caller Understands Yes PreDisposition InappropriateToAsk Care Advice Given Per Guideline SEE HCP (OR PCP TRIAGE) WITHIN 4 HOURS: CALL BACK IF: * You become worse CARE ADVICE given per Neurologic Deficit (Adult) guideline. Comments User: Lorraine Lax, RN Date/Time Lamount Cohen Time): 10/30/2020 2:02:08 PM Caller states the pain is getting worse over weeks with pain shooting up leg also. Numbness has been present since original stroke. Stated this after advising 911 dispo. User: Lorraine Lax, RN Date/Time Lamount Cohen Time): 10/30/2020 2:08:26 PM Contacted office backline per  profile,  no appointments available. Advised UC for symptoms. Referrals GO TO FACILITY OTHER - SPECIFY

## 2020-11-05 ENCOUNTER — Other Ambulatory Visit: Payer: Self-pay

## 2020-11-05 ENCOUNTER — Ambulatory Visit (INDEPENDENT_AMBULATORY_CARE_PROVIDER_SITE_OTHER): Payer: 59 | Admitting: Family Medicine

## 2020-11-05 VITALS — BP 120/70 | HR 116 | Temp 97.0°F | Ht 62.0 in | Wt 127.4 lb

## 2020-11-05 DIAGNOSIS — N3000 Acute cystitis without hematuria: Secondary | ICD-10-CM

## 2020-11-05 DIAGNOSIS — R350 Frequency of micturition: Secondary | ICD-10-CM | POA: Diagnosis not present

## 2020-11-05 DIAGNOSIS — Z0189 Encounter for other specified special examinations: Secondary | ICD-10-CM

## 2020-11-05 DIAGNOSIS — F3342 Major depressive disorder, recurrent, in full remission: Secondary | ICD-10-CM

## 2020-11-05 DIAGNOSIS — Z79899 Other long term (current) drug therapy: Secondary | ICD-10-CM | POA: Diagnosis not present

## 2020-11-05 DIAGNOSIS — F1111 Opioid abuse, in remission: Secondary | ICD-10-CM

## 2020-11-05 LAB — CBC
HCT: 38.6 % (ref 36.0–46.0)
Hemoglobin: 12.9 g/dL (ref 12.0–15.0)
MCHC: 33.5 g/dL (ref 30.0–36.0)
MCV: 89.7 fl (ref 78.0–100.0)
Platelets: 272 10*3/uL (ref 150.0–400.0)
RBC: 4.3 Mil/uL (ref 3.87–5.11)
RDW: 12.9 % (ref 11.5–15.5)
WBC: 5 10*3/uL (ref 4.0–10.5)

## 2020-11-05 LAB — POC URINALSYSI DIPSTICK (AUTOMATED)
Bilirubin, UA: NEGATIVE
Blood, UA: POSITIVE
Glucose, UA: NEGATIVE
Ketones, UA: NEGATIVE
Leukocytes, UA: NEGATIVE
Nitrite, UA: POSITIVE
Protein, UA: POSITIVE — AB
Spec Grav, UA: >=1.03 — AB (ref 1.010–1.025)
Urobilinogen, UA: 1 E.U./dL
pH, UA: 6 (ref 5.0–8.0)

## 2020-11-05 LAB — COMPREHENSIVE METABOLIC PANEL
ALT: 8 U/L (ref 0–35)
AST: 13 U/L (ref 0–37)
Albumin: 4.4 g/dL (ref 3.5–5.2)
Alkaline Phosphatase: 72 U/L (ref 39–117)
BUN: 15 mg/dL (ref 6–23)
CO2: 23 mEq/L (ref 19–32)
Calcium: 9.3 mg/dL (ref 8.4–10.5)
Chloride: 108 mEq/L (ref 96–112)
Creatinine, Ser: 0.61 mg/dL (ref 0.40–1.20)
GFR: 124.36 mL/min (ref >60.00)
Glucose, Bld: 78 mg/dL (ref 70–99)
Potassium: 3.7 mEq/L (ref 3.5–5.1)
Sodium: 139 mEq/L (ref 135–145)
Total Bilirubin: 1.3 mg/dL — ABNORMAL HIGH (ref 0.2–1.2)
Total Protein: 7.1 g/dL (ref 6.0–8.3)

## 2020-11-05 LAB — LIPID PANEL
Cholesterol: 127 mg/dL (ref 0–200)
HDL: 44.1 mg/dL (ref >39.00)
LDL Cholesterol: 67 mg/dL (ref 0–99)
NonHDL: 82.93
Total CHOL/HDL Ratio: 3
Triglycerides: 79 mg/dL (ref 0.0–149.0)
VLDL: 15.8 mg/dL (ref 0.0–40.0)

## 2020-11-05 LAB — HEMOGLOBIN A1C: Hgb A1c MFr Bld: 5.1 % (ref 4.6–6.5)

## 2020-11-05 MED ORDER — NITROFURANTOIN MONOHYD MACRO 100 MG PO CAPS: 100.0000 mg | ORAL_CAPSULE | Freq: Two times a day (BID) | ORAL | 0 refills | Status: AC

## 2020-11-05 NOTE — Assessment & Plan Note (Addendum)
Following with psych, doing well - continue duloxetine, bupar, seroquel. Needs screening labs for metabolic disorders today

## 2020-11-05 NOTE — Assessment & Plan Note (Signed)
Follows with psych. On Suboxone. Doing well, no IV drug use >2 years. Will get labs as requested from psych for screening for infections

## 2020-11-05 NOTE — Progress Notes (Signed)
Subjective:     Jane Watkins is a 25 y.o. female presenting for Urinary Frequency (X 1 month)     Urinary Frequency  Associated symptoms include frequency.   #Urinary  - 1 month ago of symptoms - odor - urgency - no burning - some frequency - some hesitancy - no vaginal discharge - no new partners - similar to prior UTI - has come and gone over the last month, but consistent x 2 weeks - drinking water w/ improvement  #blood work - would like labs done for Dr. Gerlene Fee - a1c, lipid, cbc, cmp, hiv, syphillis, hep a, hep b, and hep c screening  - no recent use of IV drugs  #achilles tendon  - needs surgery  Review of Systems  Genitourinary:  Positive for frequency.    Social History   Tobacco Use  Smoking Status Every Day   Packs/day: 0.50   Years: 10.00   Pack years: 5.00   Types: Cigarettes  Smokeless Tobacco Never        Objective:    BP Readings from Last 3 Encounters:  11/05/20 120/70  10/05/20 112/75  07/03/20 123/77   Wt Readings from Last 3 Encounters:  11/05/20 127 lb 6 oz (57.8 kg)  10/05/20 127 lb 12.8 oz (58 kg)  07/03/20 132 lb 3.2 oz (60 kg)    BP 120/70   Pulse (!) 116   Temp (!) 97 F (36.1 C) (Temporal)   Ht 5\' 2"  (1.575 m)   Wt 127 lb 6 oz (57.8 kg)   SpO2 98%   BMI 23.30 kg/m    Physical Exam Constitutional:      General: She is not in acute distress.    Appearance: She is well-developed. She is not diaphoretic.  HENT:     Head: Normocephalic and atraumatic.     Right Ear: External ear normal.     Left Ear: External ear normal.  Eyes:     Conjunctiva/sclera: Conjunctivae normal.  Cardiovascular:     Rate and Rhythm: Normal rate and regular rhythm.     Heart sounds: Normal heart sounds.  Pulmonary:     Effort: Pulmonary effort is normal.  Abdominal:     General: Bowel sounds are normal. There is no distension.     Palpations: Abdomen is soft.     Tenderness: There is abdominal tenderness (generalized). There  is no right CVA tenderness, left CVA tenderness or guarding.  Musculoskeletal:     Cervical back: Neck supple.  Skin:    General: Skin is warm and dry.     Capillary Refill: Capillary refill takes less than 2 seconds.  Neurological:     Mental Status: She is alert. Mental status is at baseline.  Psychiatric:        Mood and Affect: Mood normal.        Behavior: Behavior normal.          Assessment & Plan:   Problem List Items Addressed This Visit       Other   MDD (major depressive disorder), recurrent, in full remission (HCC)    Following with psych, doing well - continue duloxetine, bupar, seroquel. Needs screening labs for metabolic disorders today      Opioid use disorder, mild, in sustained remission (HCC)    Follows with psych. On Suboxone. Doing well, no IV drug use >2 years. Will get labs as requested from psych for screening for infections      Other Visit Diagnoses  Acute cystitis without hematuria    -  Primary   Relevant Orders   Urine Culture   Urinary frequency       Relevant Orders   POCT Urinalysis Dipstick (Automated) (Completed)   History of ongoing treatment with high-risk medication       Relevant Orders   Lipid panel   Comprehensive metabolic panel   CBC   HIV Antibody (routine testing w rflx)   RPR   Hepatitis C antibody   HAV, HBV Panel   Assessment of effects of psychotropic drug in patient at risk for metabolic syndrome       Relevant Orders   Hemoglobin A1c   Lipid panel   Comprehensive metabolic panel   CBC      UA with +nitrites no LE. Will treat for infection as similar to prior. Send for culture.   Return if symptoms worsen or fail to improve.  [name], MD  This visit occurred during the SARS-CoV-2 public health emergency.  Safety protocols were in place, including screening questions prior to the visit, additional usage of staff PPE, and extensive cleaning of exam room while observing appropriate contact time as  indicated for disinfecting solutions.

## 2020-11-05 NOTE — Patient Instructions (Signed)
Take antibiotic If not improving notify me

## 2020-11-06 LAB — HIV ANTIBODY (ROUTINE TESTING W REFLEX): HIV 1&2 Ab, 4th Generation: NONREACTIVE

## 2020-11-06 LAB — RPR: RPR Ser Ql: NONREACTIVE

## 2020-11-06 LAB — HEPATITIS C ANTIBODY
Hepatitis C Ab: NONREACTIVE
SIGNAL TO CUT-OFF: 0.01 (ref <1.00)

## 2020-11-07 LAB — URINE CULTURE
MICRO NUMBER:: 12422417
SPECIMEN QUALITY:: ADEQUATE

## 2020-11-07 LAB — HAV, HBV PANEL
Hep B Core Total Ab: NEGATIVE
Hep B Surface Ab, Qual: NONREACTIVE
Hepatitis B Surface Ag: NEGATIVE
hep A Total Ab: POSITIVE — AB

## 2020-11-07 LAB — HEPATITIS A ANTIBODY, IGM: Hep A IgM: NEGATIVE

## 2020-11-08 ENCOUNTER — Encounter: Payer: Self-pay | Admitting: Family Medicine

## 2020-11-13 ENCOUNTER — Encounter: Payer: Self-pay | Admitting: Adult Health

## 2020-11-13 ENCOUNTER — Ambulatory Visit (INDEPENDENT_AMBULATORY_CARE_PROVIDER_SITE_OTHER): Payer: 59 | Admitting: Adult Health

## 2020-11-13 VITALS — BP 124/80 | HR 93 | Ht 62.0 in | Wt 128.0 lb

## 2020-11-13 DIAGNOSIS — G43709 Chronic migraine without aura, not intractable, without status migrainosus: Secondary | ICD-10-CM | POA: Diagnosis not present

## 2020-11-13 DIAGNOSIS — G8114 Spastic hemiplegia affecting left nondominant side: Secondary | ICD-10-CM | POA: Diagnosis not present

## 2020-11-13 DIAGNOSIS — I63511 Cerebral infarction due to unspecified occlusion or stenosis of right middle cerebral artery: Secondary | ICD-10-CM
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
## 2020-11-13 NOTE — Patient Instructions (Signed)
Continue to follow with Dr. Carlis Abbott for spasticity symptoms after your stroke  Continue aspirin 81 mg daily  and atorvastatin 20mg  daily  for secondary stroke prevention  Continue to follow up with PCP regarding cholesterol and blood pressure management  Maintain strict control of hypertension with blood pressure goal below 130/90 and cholesterol with LDL cholesterol (bad cholesterol) goal below 70 mg/dL.       Followup in the future with me in 6 months or call earlier if needed       Thank you for coming to see at [facility]. I hope we have been able to provide you high quality care today.  You may receive a patient satisfaction survey over the next few weeks. We would appreciate your feedback and comments so that we may continue to improve ourselves and the health of our patients.

## 2020-12-05 ENCOUNTER — Ambulatory Visit: Payer: 59 | Admitting: Adult Health

## 2020-12-11 ENCOUNTER — Other Ambulatory Visit: Payer: Self-pay | Admitting: Physical Medicine and Rehabilitation

## 2020-12-12 ENCOUNTER — Other Ambulatory Visit: Payer: Self-pay | Admitting: Physical Medicine and Rehabilitation

## 2020-12-12 MED ORDER — SUMATRIPTAN SUCCINATE 50 MG PO TABS: 50.0000 mg | ORAL_TABLET | ORAL | 0 refills | Status: DC | PRN

## 2020-12-12 MED ORDER — SPIRONOLACTONE 50 MG PO TABS: 50.0000 mg | ORAL_TABLET | Freq: Two times a day (BID) | ORAL | 3 refills | Status: DC

## 2020-12-12 MED ORDER — QUETIAPINE FUMARATE 200 MG PO TABS: 200.0000 mg | ORAL_TABLET | Freq: Every day | ORAL | 2 refills | Status: DC

## 2020-12-20 ENCOUNTER — Encounter: Payer: 59 | Attending: Physical Medicine and Rehabilitation | Admitting: Physical Medicine and Rehabilitation

## 2020-12-20 ENCOUNTER — Other Ambulatory Visit: Payer: Self-pay

## 2020-12-20 VITALS — BP 126/63 | HR 103 | Ht 62.0 in | Wt 124.6 lb

## 2020-12-20 DIAGNOSIS — M21612 Bunion of left foot: Secondary | ICD-10-CM | POA: Diagnosis present

## 2020-12-20 DIAGNOSIS — G8110 Spastic hemiplegia affecting unspecified side: Secondary | ICD-10-CM | POA: Diagnosis present

## 2020-12-20 DIAGNOSIS — F339 Major depressive disorder, recurrent, unspecified: Secondary | ICD-10-CM | POA: Diagnosis present

## 2020-12-20 MED ORDER — ESCITALOPRAM OXALATE 10 MG PO TABS: 10.0000 mg | ORAL_TABLET | Freq: Every day | ORAL | 3 refills | Status: DC

## 2020-12-20 MED ORDER — QUETIAPINE FUMARATE 50 MG PO TABS: 200.0000 mg | ORAL_TABLET | Freq: Every day | ORAL | 3 refills | Status: DC
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
## 2021-01-02 ENCOUNTER — Ambulatory Visit: Payer: 59 | Admitting: Podiatry

## 2021-01-07 ENCOUNTER — Encounter: Payer: Self-pay | Admitting: Adult Health

## 2021-01-08 ENCOUNTER — Other Ambulatory Visit: Payer: Self-pay | Admitting: Physical Medicine and Rehabilitation

## 2021-01-08 MED ORDER — PROPRANOLOL HCL 10 MG PO TABS: 10.0000 mg | ORAL_TABLET | Freq: Every day | ORAL | 3 refills | Status: DC | PRN

## 2021-01-08 NOTE — Telephone Encounter (Signed)
Paragard rcvd/charged 07/06/2019

## 2021-01-09 ENCOUNTER — Telehealth: Payer: Self-pay | Admitting: Family Medicine

## 2021-01-09 NOTE — Telephone Encounter (Signed)
Last office note faxed to [facility]. Fax went through.

## 2021-01-09 NOTE — Telephone Encounter (Signed)
Synetta Fail with [facility] called asking for pt last OV, something stating that she has been seen. Synetta Fail states that you can fax it to [phone].

## 2021-01-11 ENCOUNTER — Ambulatory Visit: Payer: 59 | Admitting: Physical Medicine and Rehabilitation

## 2021-01-28 ENCOUNTER — Other Ambulatory Visit: Payer: Self-pay | Admitting: Physical Medicine and Rehabilitation

## 2021-01-28 NOTE — Telephone Encounter (Signed)
Pharmacy is sending refill request for Sumatriptan. Would you like to refill?

## 2021-01-29 ENCOUNTER — Other Ambulatory Visit: Payer: Self-pay

## 2021-01-29 MED ORDER — SUMATRIPTAN SUCCINATE 50 MG PO TABS
50.0000 mg | ORAL_TABLET | ORAL | 0 refills | Status: DC | PRN
  Filled 2021-01-29: qty 9, 30d supply, fill #0
  Filled 2021-01-31: qty 10, 30d supply, fill #0
  Filled 2021-09-26: qty 10, 1d supply, fill #0

## 2021-01-31 ENCOUNTER — Other Ambulatory Visit: Payer: Self-pay

## 2021-02-12 ENCOUNTER — Other Ambulatory Visit: Payer: Self-pay

## 2021-03-05 ENCOUNTER — Encounter (HOSPITAL_COMMUNITY): Payer: Self-pay

## 2021-03-05 ENCOUNTER — Ambulatory Visit (HOSPITAL_COMMUNITY): Payer: 59 | Admitting: Psychiatry

## 2021-04-16 ENCOUNTER — Ambulatory Visit (HOSPITAL_COMMUNITY): Payer: 59 | Admitting: Psychiatry

## 2021-04-29 ENCOUNTER — Encounter (HOSPITAL_COMMUNITY): Payer: Self-pay

## 2021-04-29 ENCOUNTER — Other Ambulatory Visit: Payer: Self-pay

## 2021-04-29 ENCOUNTER — Emergency Department (HOSPITAL_COMMUNITY)
Admission: EM | Admit: 2021-04-29 | Discharge: 2021-04-29 | Disposition: A | Discharge status: Home / Self Care | Payer: 59 | Attending: Emergency Medicine | Admitting: Emergency Medicine

## 2021-04-29 ENCOUNTER — Emergency Department (HOSPITAL_COMMUNITY): Payer: 59

## 2021-04-29 DIAGNOSIS — Z8673 Personal history of transient ischemic attack (TIA), and cerebral infarction without residual deficits: Secondary | ICD-10-CM | POA: Insufficient documentation

## 2021-04-29 DIAGNOSIS — Z7982 Long term (current) use of aspirin: Secondary | ICD-10-CM | POA: Diagnosis not present

## 2021-04-29 DIAGNOSIS — R569 Unspecified convulsions: Secondary | ICD-10-CM | POA: Insufficient documentation

## 2021-04-29 LAB — CBC WITH DIFFERENTIAL/PLATELET
Abs Immature Granulocytes: 0.02 10*3/uL (ref 0.00–0.07)
Basophils Absolute: 0 10*3/uL (ref 0.0–0.1)
Basophils Relative: 1 %
Eosinophils Absolute: 0.1 10*3/uL (ref 0.0–0.5)
Eosinophils Relative: 2 %
HCT: 39.4 % (ref 36.0–46.0)
Hemoglobin: 13 g/dL (ref 12.0–15.0)
Immature Granulocytes: 0 %
Lymphocytes Relative: 31 %
Lymphs Abs: 1.9 10*3/uL (ref 0.7–4.0)
MCH: 30.7 pg (ref 26.0–34.0)
MCHC: 33 g/dL (ref 30.0–36.0)
MCV: 93.1 fL (ref 80.0–100.0)
Monocytes Absolute: 0.5 10*3/uL (ref 0.1–1.0)
Monocytes Relative: 8 %
Neutro Abs: 3.5 10*3/uL (ref 1.7–7.7)
Neutrophils Relative %: 58 %
Platelets: 236 10*3/uL (ref 150–400)
RBC: 4.23 MIL/uL (ref 3.87–5.11)
RDW: 12.7 % (ref 11.5–15.5)
WBC: 6 10*3/uL (ref 4.0–10.5)
nRBC: 0 % (ref 0.0–0.2)

## 2021-04-29 LAB — COMPREHENSIVE METABOLIC PANEL
ALT: 8 U/L (ref 0–44)
AST: 12 U/L — ABNORMAL LOW (ref 15–41)
Albumin: 3.9 g/dL (ref 3.5–5.0)
Alkaline Phosphatase: 47 U/L (ref 38–126)
Anion gap: 7 (ref 5–15)
BUN: 17 mg/dL (ref 6–20)
CO2: 24 mmol/L (ref 22–32)
Calcium: 8.7 mg/dL — ABNORMAL LOW (ref 8.9–10.3)
Chloride: 112 mmol/L — ABNORMAL HIGH (ref 98–111)
Creatinine, Ser: 0.67 mg/dL (ref 0.44–1.00)
GFR, Estimated: >60 mL/min (ref >60)
Glucose, Bld: 99 mg/dL (ref 70–99)
Potassium: 3.6 mmol/L (ref 3.5–5.1)
Sodium: 143 mmol/L (ref 135–145)
Total Bilirubin: 0.8 mg/dL (ref 0.3–1.2)
Total Protein: 6.2 g/dL — ABNORMAL LOW (ref 6.5–8.1)

## 2021-04-29 LAB — HCG, QUANTITATIVE, PREGNANCY: hCG, Beta Chain, Quant, S: <1 m[IU]/mL (ref <5)

## 2021-04-29 LAB — ETHANOL: Alcohol, Ethyl (B): <10 mg/dL (ref <10)

## 2021-04-29 LAB — ACETAMINOPHEN LEVEL: Acetaminophen (Tylenol), Serum: <10 ug/mL — ABNORMAL LOW (ref 10–30)

## 2021-04-29 MED ORDER — ACETAMINOPHEN 325 MG PO TABS
650.0000 mg | ORAL_TABLET | Freq: Once | ORAL | Status: AC
  Administered 2021-04-29: 650 mg via ORAL
  Filled 2021-04-29: qty 2

## 2021-04-29 MED ORDER — SODIUM CHLORIDE 0.9 % IV SOLN
3000.0000 mg | Freq: Once | INTRAVENOUS | Status: AC
  Administered 2021-04-29: 3000 mg via INTRAVENOUS
  Filled 2021-04-29: qty 30

## 2021-04-29 NOTE — Consult Note (Signed)
Neurology Consultation ? ?Reason for Consult: seizures  ?Referring Physician: Dr. Rhunette CroftNanavati  ? ?CC: seizures in the setting of cocaine use  ? ?History is obtained from:patient and dad at bedside ? ?HPI: Jane GrippeLydia Mae Watkins is a 26 y.o. female with a past medical history of IV drug use, seizures in the setting of drug use, depression, anxiety, stroke, GERD and migraines who presents from a hotel for seizure activity. Per records she had a total of 5 seizures and 2 of these  witnessed by EMS lasting 1.5 minutes and was given 2.5mg  Midazaolam. Per patient, she was with friends when this occurred and the friends called EMS. She does not remember details only that she knew a seizure was coming on. She tells me she last did cocaine this morning. Her last seizure was ~10 days ago in the setting of cocaine use. She does not take AEDs at home. Patient offered counseling for  substance abuse and declined. Also explained that seizures are related to cocaine abuse. She c/o headache.  ? ? ?ROS: Full ROS was performed and is negative except as noted in the HPI.    ? ?Past Medical History:  ?Diagnosis Date  ? AKI (acute kidney injury) (HCC) 2018  ? "from overdose"  ? Anxiety   ? Chlamydia   ? Daily headache   ? Depression   ? Drug overdose 04/2016  ? [name]/notes 05/01/2016  ? GERD (gastroesophageal reflux disease)   ? "when I was younger; gone now" (09/21/2017)  ? IV drug abuse (HCC) 09/20/2017  ? Migraine   ? "q couple weeks" (09/21/2017)  ? Opioid abuse (HCC)   ? Overdose   ? Stroke [facility]HCC)   ? ? ?Family History  ?Problem Relation Age of Onset  ? Anemia Father   ? Healthy Sister   ? Thyroid cancer Maternal Grandmother   ? Lung cancer Maternal Grandfather   ?     metastasized  ? Lung cancer Paternal Grandfather   ? ? ? ?Social History:  ? reports that she has been smoking cigarettes. She has a 5.00 pack-year smoking history. She has never used smokeless tobacco. She reports current drug use. Drugs: IV, Heroin, Cocaine, and Marijuana. She  reports that she does not drink alcohol. ? ?Medications ? ?Current Facility-Administered Medications:  ?  levETIRAcetam (KEPPRA) 3,000 mg in sodium chloride 0.9 % 250 mL IVPB, 3,000 mg, Intravenous, Once, Micheline MazeKiehl, Robin, MD ?  paragard intrauterine copper IUD, , Intrauterine, Once, Copland, Alicia B, PA-C ? ?Current Outpatient Medications:  ?  acetaminophen (TYLENOL) 325 MG tablet, Take 2 tablets (650 mg total) by mouth every 4 (four) hours as needed for mild pain (or temp > 37.5 C (99.5 F))., Disp:  , Rfl:  ?  aspirin EC 81 MG tablet, Take 1 tablet (81 mg total) by mouth daily. Swallow whole., Disp: 90 tablet, Rfl: 3 ?  atorvastatin (LIPITOR) 20 MG tablet, Take 1 tablet (20 mg total) by mouth daily at 6 PM., Disp: 90 tablet, Rfl: 3 ?  baclofen (LIORESAL) 10 MG tablet, Take 1 tablet (10 mg total) by mouth 3 (three) times daily., Disp: 90 tablet, Rfl: 1 ?  benzoyl peroxide 5 % external liquid, Apply topically 2 (two) times daily., Disp: , Rfl:  ?  buprenorphine-naloxone (SUBOXONE) 8-2 mg SUBL SL tablet, Place 1 tablet under the tongue 3 (three) times daily., Disp: , Rfl:  ?  busPIRone (BUSPAR) 10 MG tablet, TAKE 1 TABLET BY MOUTH THREE TIMES A DAY (Patient taking differently: Takes as  needed), Disp: 270 tablet, Rfl: 2 ?  Clindamycin-Benzoyl Per, Refr, gel, Apply 1 application topically daily., Disp: , Rfl:  ?  clindamycin-benzoyl peroxide (BENZACLIN) gel, As directed, Disp: , Rfl:  ?  doxycycline (VIBRA-TABS) 100 MG tablet, Take 100 mg by mouth 2 (two) times daily., Disp: , Rfl:  ?  DULoxetine (CYMBALTA) 30 MG capsule, Take 1 capsule (30 mg total) by mouth daily. (Patient taking differently: Take 90 mg by mouth daily.), Disp: 30 capsule, Rfl: 5 ?  escitalopram (LEXAPRO) 10 MG tablet, Take 1 tablet (10 mg total) by mouth daily., Disp: 30 tablet, Rfl: 3 ?  esomeprazole (NEXIUM) 40 MG capsule, Take 1 capsule (40 mg total) by mouth daily at 12 noon., Disp: 30 capsule, Rfl: 1 ?  gabapentin (NEURONTIN) 300 MG capsule, Take 1  capsule (300 mg total) by mouth at bedtime., Disp: 90 capsule, Rfl: 3 ?  nicotine (NICODERM CQ - DOSED IN MG/24 HOURS) 21 mg/24hr patch, Place 1 patch (21 mg total) onto the skin daily., Disp: 28 patch, Rfl: 3 ?  pantoprazole (PROTONIX) 40 MG tablet, Take 1 tablet (40 mg total) by mouth daily., Disp: 30 tablet, Rfl: 0 ?  propranolol (INDERAL) 10 MG tablet, Take 1 tablet (10 mg total) by mouth daily as needed., Disp: 30 tablet, Rfl: 3 ?  QUEtiapine (SEROQUEL) 200 MG tablet, Take 1 tablet (200 mg total) by mouth at bedtime., Disp: 30 tablet, Rfl: 2 ?  QUEtiapine (SEROQUEL) 50 MG tablet, Take 4 tablets (200 mg total) by mouth at bedtime., Disp: 60 tablet, Rfl: 3 ?  spironolactone (ALDACTONE) 50 MG tablet, Take 1 tablet (50 mg total) by mouth 2 (two) times daily., Disp: 60 tablet, Rfl: 3 ?  SUMAtriptan (IMITREX) 50 MG tablet, Take 1 tablet (50 mg total) by mouth every 2 (two) hours as needed for migraine. May repeat in 2 hours if headache persists or recurs., Disp: 10 tablet, Rfl: 0 ?  tiZANidine (ZANAFLEX) 4 MG tablet, Take 1 tablet (4 mg total) by mouth 3 (three) times daily., Disp: 270 tablet, Rfl: 0 ?  topiramate (TOPAMAX) 50 MG tablet, Take 2 tablets (100 mg total) by mouth daily as needed (for acute migraine relief)., Disp: 60 tablet, Rfl: 3 ?  tretinoin (RETIN-A) 0.05 % cream, As directed, Disp: , Rfl:  ? ? ?Exam: ?Current vital signs: ?BP 106/77 (BP Location: Left Arm)   Pulse 83   Temp 99.9 ?F (37.7 ?C) (Axillary)   Resp 14   LMP  (LMP Unknown)   SpO2 97%  ?Vital signs in last 24 hours: ?Temp:  [99.9 ?F (37.7 ?C)] 99.9 ?F (37.7 ?C) (03/20 1708) ?Pulse Rate:  [83] 83 (03/20 1706) ?Resp:  [14] 14 (03/20 1706) ?BP: (106)/(77) 106/77 (03/20 1706) ?SpO2:  [97 %] 97 % (03/20 1706) ? ?GENERAL: Awake, alert in NAD ?HEENT: - Normocephalic and atraumatic, dry mm ?LUNGS - Clear to auscultation bilaterally with no wheezes ?CV - S1S2 RRR, no m/r/g, equal pulses bilaterally. ?ABDOMEN - Soft, nontender, nondistended with  normoactive BS ?Ext: Track marks on hands and arms. warm, well perfused, intact peripheral pulses, no edema ? ?NEURO:  ?Mental Status: AA&Ox3  ?Language: speech is clear.  Naming, repetition, fluency, and comprehension intact. ?Cranial Nerves: PERRL 3 mm/brisk. EOMI, visual fields full, no facial asymmetry, facial sensation intact, hearing intact, tongue/uvula/soft palate midline, normal sternocleidomastoid and trapezius muscle strength. No evidence of tongue atrophy or fibrillations ?Motor: 5/5 in all 4 extremities ?Tone: is normal and bulk is normal ?Sensation- Intact to light touch  bilaterally ?Coordination: FTN intact bilaterally, no ataxia in BLE. ?Gait- deferred ? ? ? ?Imaging ?I have reviewed the images obtained: ? ?CT-head 3/20: ?1. Mild progression of high right frontal parietal lobe ?encephalomalacia in region of patient's prior infarct. ?2. No acute hemorrhage, edema or mass effect. ?3. Stable old infarct in the right basal ganglia. ?  ? ?Assessment:  ?Jane Watkins is a 26 y.o. female with a past medical history of IV drug use, seizures in the setting of drug use, depression, anxiety, stroke, GERD and migraines who presents from a hotel for seizure activity. Per records she had a total of 5 seizures and 2 of these  witnessed by EMS lasting 1.5 minutes and was given 2.5mg  Midazaolam. Per patient, she was with friends when this occurred and the friends called EMS. She does not remember details only that she knew a seizure was coming on. She tells me she last did cocaine this morning. ? 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 ?Recommendations: ?-Patient can be discharged home and follow up with neurologist as outpatient ?-counseling offered for substance abuse  ?- no need for AEDs since seizures are in the setting of drug use.  ?- please call neurology if have any questions or concerns  ? ?Gevena Mart DNP [id number]  ? ? ?

## 2021-04-29 NOTE — ED Provider Notes (Signed)
?[facility] EMERGENCY DEPARTMENT ?Provider Note ? ? ?CSN: [id number] ?Arrival date & time: 04/29/21  1650 ? ?  ? ?History ?Chief Complaint  ?Patient presents with  ? Seizures  ? ? ?Jane Watkins Charlett Blake is a 25 y.o. 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  Patient was at [facility] with her boyfriend at that time.  Patient had approximately 5 seizures lasting 1.5 minutes.  2 seizures occurred prior to fires arrival and when they arrived she did not want to be transported.  She then had an additional seizure which is occurring when EMS arrived.  They report that she stares off and shakes but they did not appear generalized tonic-clonic.  Patient then was talking some but did not seem fully back to her baseline and had an additional seizure for approximately a minute and a half.  She then stopped but shortly after had another which was aborted with 2.5 of midazolam.  EMS reports likely 10 to 15 minutes of seizure-like activity without return to baseline prior to that time.  Patient has been doing well since that time.  She is intermittently speaking but also very somnolent.  She tells me to go away when asking her questions.  Per EMS patient takes lamotrigine.  ? ?Encarnacion Slates (prior boyfriend): He states that here first seizure was on his birthday. March 8th had a seizure. Shaking her whole body. Eyes gazed upwards. Lips turned blue then went back to normal. Cheated on him due to wanting him to give her money for Cocaine. She has been using Crack Cocaine since February 20th. He was not with her at the [facility] today but did see her this morning and says she is at her  normal.  Is calling her father to update him. ? ? ?Seizures ? ?  ? ?Home Medications ?Prior to Admission medications   ?Medication Sig Start Date End Date Taking? Authorizing Provider  ?acetaminophen (TYLENOL) 325 MG tablet Take 2 tablets (650 mg total) by mouth every 4 (four) hours as needed for mild pain (or temp > 37.5 C (99.5 F)). 02/28/19   Angiulli, Mcarthur Rossetti, PA-C  ?aspirin EC 81 MG tablet Take 1 tablet (81 mg total) by mouth daily. Swallow whole. 06/04/20   Ihor Austin, NP  ?atorvastatin (LIPITOR) 20 MG tablet Take 1 tablet (20 mg total) by mouth daily at 6 PM. 06/04/20   Ihor Austin, NP  ?baclofen (LIORESAL) 10 MG tablet Take 1 tablet (10 mg total) by mouth 3 (three) times daily. 02/08/20   Raulkar, Drema Pry, MD  ?benzoyl peroxide 5 % external liquid Apply topically 2 (two) times daily.    [provider]  ?buprenorphine-naloxone (SUBOXONE) 8-2 mg SUBL SL tablet Place 1 tablet under the tongue 3 (three) times daily.    [provider]  ?busPIRone (BUSPAR) 10 MG tablet TAKE 1 TABLET BY MOUTH THREE TIMES A DAY ?Patient taking differently: Takes as needed 11/16/19   Raulkar, Drema Pry, MD  ?Clindamycin-Benzoyl Per, Refr, gel Apply 1 application topically daily. 01/24/20   [provider]  ?clindamycin-benzoyl peroxide (BENZACLIN) gel As directed 06/07/19   [provider]  ?doxycycline (VIBRA-TABS) 100 MG tablet Take 100 mg by mouth 2 (two) times daily. 07/04/19  [provider]  ?DULoxetine (CYMBALTA) 30 MG capsule Take 1 capsule (30 mg total) by mouth daily. ?Patient taking differently: Take 90 mg by mouth daily. 06/04/20   Ihor AustinMcCue, Jessica, NP  ?escitalopram (LEXAPRO) 10 MG tablet Take 1 tablet (10 mg total) by mouth daily. 12/20/20   Raulkar, Drema PryKrutika P, MD  ?esomeprazole (NEXIUM) 40 MG capsule Take 1 capsule (40 mg total) by mouth daily at 12 noon. 02/10/20   Raulkar, Drema PryKrutika P, MD  ?gabapentin (NEURONTIN) 300 MG capsule Take 1 capsule (300 mg total) by mouth at  bedtime. 07/24/20   Raulkar, Drema PryKrutika P, MD  ?nicotine (NICODERM CQ - DOSED IN MG/24 HOURS) 21 mg/24hr patch Place 1 patch (21 mg total) onto the skin daily. 06/04/20   Ihor AustinMcCue, Jessica, NP  ?pantoprazole (PROTONIX) 40 MG tablet Take 1 tablet (40 mg total) by mouth daily. 03/01/19   Angiulli, Mcarthur Rossettianiel J, PA-C  ?propranolol (INDERAL) 10 MG tablet Take 1 tablet (10 mg total) by mouth daily as needed. 01/08/21   Raulkar, Drema PryKrutika P, MD  ?QUEtiapine (SEROQUEL) 200 MG tablet Take 1 tablet (200 mg total) by mouth at bedtime. 12/12/20   Raulkar, Drema PryKrutika P, MD  ?QUEtiapine (SEROQUEL) 50 MG tablet Take 4 tablets (200 mg total) by mouth at bedtime. 12/20/20   Raulkar, Drema PryKrutika P, MD  ?spironolactone (ALDACTONE) 50 MG tablet Take 1 tablet (50 mg total) by mouth 2 (two) times daily. 12/12/20   Raulkar, Drema PryKrutika P, MD  ?SUMAtriptan (IMITREX) 50 MG tablet Take 1 tablet (50 mg total) by mouth every 2 (two) hours as needed for migraine. May repeat in 2 hours if headache persists or recurs. 01/29/21   Raulkar, Drema PryKrutika P, MD  ?tiZANidine (ZANAFLEX) 4 MG tablet Take 1 tablet (4 mg total) by mouth 3 (three) times daily. 03/28/20   Raulkar, Drema PryKrutika P, MD  ?topiramate (TOPAMAX) 50 MG tablet Take 2 tablets (100 mg total) by mouth daily as needed (for acute migraine relief). 06/04/20   Ihor AustinMcCue, Jessica, NP  ?tretinoin (RETIN-A) 0.05 % cream As directed 06/07/19   [provider]  ?   ? ?Allergies    ?Pristiq [desvenlafaxine succinate er], Other, and Sulfa antibiotics   ? ?Review of Systems   ?Review of Systems  ?Neurological:  Positive for seizures.  ? ?Physical Exam ?Updated Vital Signs ?BP 116/78   Pulse 87   Temp 99.9 ?F (37.7 ?C) (Axillary)   Resp 14   LMP  (LMP Unknown)   SpO2 97%  ?Physical Exam ?Constitutional:   ?   Appearance: She is normal weight.  ?   Comments: Sleepy, minimally interactive  ?HENT:  ?   Head: Normocephalic and atraumatic.  ?Eyes:  ?   Extraocular Movements: Extraocular movements intact.  ?   Conjunctiva/sclera:  Conjunctivae normal.  ?   Pupils: Pupils are equal, round, and reactive to light.  ?Cardiovascular:  ?   Rate and Rhythm: Normal rate and regular rhythm.  ?Pulmonary:  ?   Effort: Pulmonary effort is normal. No respiratory distress.  ?Abdominal:  ?   General: There is no distension.  ?   Tenderness: There is no abdominal tenderness. There is no guarding or rebound.  ?Musculoskeletal:  ?   Cervical back: Normal range of motion. No tenderness.  ?Neurological:  ?   Comments: Contraction and stiff left upper extremity. ?Moving right upper extremity. ?PERRL. ?Will open her eyes when asked but not participating in full neuro exam.  ? ? ?ED Results / Procedures / Treatments   ?Labs ?(all  labs ordered are listed, but only abnormal results are displayed) ?Labs Reviewed  ?COMPREHENSIVE METABOLIC PANEL - Abnormal; Notable for the following components:  ?    Result Value  ? Chloride 112 (*)   ? Calcium 8.7 (*)   ? Total Protein 6.2 (*)   ? AST 12 (*)   ? All other components within normal limits  ?ACETAMINOPHEN LEVEL - Abnormal; Notable for the following components:  ? Acetaminophen (Tylenol), Serum <10 (*)   ? All other components within normal limits  ?ETHANOL  ?CBC WITH DIFFERENTIAL/PLATELET  ?HCG, QUANTITATIVE, PREGNANCY  ?PREGNANCY, URINE  ?URINALYSIS, ROUTINE W REFLEX MICROSCOPIC  ?RAPID URINE DRUG SCREEN, HOSP PERFORMED  ?CBG MONITORING, ED  ? ? ?EKG ?EKG Interpretation ? ?Date/Time:  Monday April 29 2021 17:03:24 EDT ?Ventricular Rate:  82 ?PR Interval:  130 ?QRS Duration: 83 ?QT Interval:  363 ?QTC Calculation: 424 ?R Axis:   73 ?Text Interpretation: Sinus rhythm No acute changes No significant change since last tracing Confirmed by Derwood Kaplan [phone]) on 04/29/2021 5:12:55 PM ? ?Radiology ?CT Head Wo Contrast ? ?Result Date: 04/29/2021 ?CLINICAL DATA:  Seizure disorder. EXAM: CT HEAD WITHOUT CONTRAST TECHNIQUE: Contiguous axial images were obtained from the base of the skull through the vertex without intravenous  contrast. RADIATION DOSE REDUCTION: This exam was performed according to the departmental dose-optimization program which includes automated exposure control, adjustment of the mA and/or kV according to patient size

## 2021-04-29 NOTE — ED Notes (Signed)
Pt d/c with dad who will be driving her home. Pt and father verbalized understanding of d/c instructions and follow up care.  ?

## 2021-04-29 NOTE — ED Triage Notes (Signed)
PER EMS: pt was picked up from a hotel for c/o seizures. She had a total of 5 seizures, 2 witnessed by EMS lasting 1.5 min each, described as staring off to the side and shaking. 2.5 mg of Midazolam. Hx of stroke (left arm deficits) and hx of seizures. She arrives very drowsy but arousable and able to follow commands. ?BP-126/78, HR-70, CBG-97, 98% 2LNC ?

## 2021-04-29 NOTE — Discharge Instructions (Addendum)
Substance Abuse Treatment Programs ? ?Intensive Outpatient Programs ?[place]     ?[place]      ?[place], [place]                   ?[phone]      ? ?[facility] ?[address] ?[place], [place] ?[phone] ? ?[facility]     ?(Inpatient and outpatient)     ?[address]           ?[phone]   ? ?[place] ?[phone] (Suboxone and Methadone) ? ?[address], [place] [address]      ?[phone]      ? ?[address] ?[place], [place] ?[place] ? ?[facility] (Outpatient/Inpatient, Chemical)    ?(insurance only) [phone]      ?       ?[facility] (Groups & Residential) ?[place], [place] ?[phone] ? ?   ?[facility]     ?[place], [place]      ?[phone]      ? ?[facility] (for caregivers and family) ?[address] ?[place], [place] ?[phone] ? ? ? ? ? ?Residential Treatment Programs ?[place]      ?[address] [place], [place] [address]  ?[phone]      ? ?[facility] ?[address], [place] [address] ?[phone] ? ?[facility]        ?[phone]      ? ?[facility] ?[phone] ? ?[facility]             ?[place]                                         ?[place], [place]                                                ?[phone] or [phone]                              ? ?[facility] ?[address] ?[place], [address] ?[phone] ? ?[facility]    ?[place], [place]     ?[phone]      ? ?[facility] ?[place], [place] ?[phone] ? ?[facility]   ?[place]     ?[place], [place] [address]     ?[phone]      ?Admissions: 8am-3pm M-F ? ?[facility] ([facility]) ?[address] ?[place],  [place] ?[phone] ? ?[facility]: Residential Program (90 Days)   ?[place], [place]      ?[phone] or [phone]    ? ?[facility]: [place], [place] ?(Walk in Hours over the weekend or by referral) ? ?[place] ?[place], [place],  [address] ?[phone] ? ?Crisis Mobile: [facility]:  [phone] (for crisis response 24 hours a day) ?[place] Hotline:      [phone] ?Outpatient Psychiatry and Counseling ? ?[facility]: Mobile Crisis  Management 24 hours:  [phone] ? ?[facility] of the [employer] sliding scale fee and walk in schedule: M-F 8am-12pm/1pm-3pm ?[place]  ?[place], [place] [address] ?[phone] ? ?[facility] ?[address] ?[place], [place] [address] ?[phone] ? ?[facility] (Formerly [facility])- new patient walk-in appointments available Monday - Friday 8am -3pm.          ?[address] ?[place], [place] [address] ?[phone] or crisis line- [phone] ? ?[facility]/ Intensive Outpatient Therapy Program ?[address] ?[place], [place] [address] ?[phone] ? ?[facility]                  ?Crisis Services      ?[phone]      ?[address]     ?[place], [place] [address]                ? ?[place]   ?[facility] ?[phone] ?[address] ?[place], [place] [address] ? ? ?[facility]          ?[address],  ?[place], [place] [address]       ?[phone] ? ?[facility] ?[address] ?[place], [place] [address] ?[phone] ? ?[facility]    ?[address]    ?[place], [place] [address]     ?[phone]      ? ?Sheralyn Boatman, MD     ?[address]     ?[place] [address]     ?[phone]     ?  ?[place] ?[place] [address] ? ?[facility]     ?[address]     ?[place], [place]      ?[phone]      ? ?[place] ?Marlou Sa, MD ?[address] ?[place], [place] [address] ?[phone] ? ?[facility]     ?[address] [phone]     ?[place], [place] [address]     ?[phone]      ? ?[facility] ?[place], [place] [address] ?[phone] ?Resources for Temporary Residential Assistance/Crisis Centers ? ?DAY CENTERS ?[facility]) ?M-F 8am-3pm   ?[address] [place], [place] [address]   [phone] ?Services include: laundry, barbering, support groups, case management, phone  & computer access, showers, AA/NA mtgs, mental health/substance abuse nurse, job skills class, disability information, VA assistance, spiritual classes, etc.  ? ?[facility]LTER[facility] ? ?[employer]     ?[place]   ?[address], [place]     ?[phone]       ?       ?[facility] (women and children)       ?[place], [place] [address] ?[phone] ?[email or web address] for application and process ?Application Required ? ?[facility]   ?[address]    ?[place] [address]     ?[phone]       ?             ?[place] ?[address] ?[place], [place] [address] ?[phone] ?[phone]([email or web address] application appt.) ?Application Required ? ?[employer] [facility] (women only)    ?[place]     ?[place], [place] [address]     ?[phone]      ?  Intake starts 6pm daily ?Need valid ID, SSC, & Police report ?Holiday representative [employer] ?[address] ?[place], [place] ?[phone] ?Application Required ? ?[facility] (men only)     ?[address]      [place], [place]     ?[phone]      ? ?[facility] [place] ?(Pregnant women only) ?[address] [place], [place] ?[phone] ? ?[facility]      ?[address]      [place], [place] [address]     ?[phone]      ?       ?[facility] ?[address] ?[place], [place] ?[phone] ?90 day commitment/SA/Application process ? ?[facility](men only)     ?[address]     ?[place], [place]     ?[phone]       ?Check-in at 7pm     ?       ?[facility] ?[address] ?[place], [place] [address] ?[phone] ?Men/Women/Women and Children must be there by 7 pm ? ?Holiday representative ?[place], [place] ?[phone]                ?You were evaluated in the Emergency Department and after careful evaluation, we did not find any emergent condition requiring admission or further testing in the hospital. ? ?Your exam/testing today was overall reassuring.  However, we are concerned for seizure activity induced from cocaine use.  We are recommending intensive inpatient rehab or outpatient resources to help overcome your addiction. ? ?Please return to the Emergency Department if you experience any worsening of your condition.  Thank you for allowing [place] to be a part of your care. ?

## 2021-04-30 ENCOUNTER — Telehealth (HOSPITAL_COMMUNITY): Payer: Self-pay | Admitting: Licensed Clinical Social Worker

## 2021-04-30 ENCOUNTER — Ambulatory Visit (HOSPITAL_COMMUNITY)
Admission: EM | Admit: 2021-04-30 | Discharge: 2021-04-30 | Disposition: A | Discharge status: Home / Self Care | Payer: 59 | Attending: Registered Nurse | Admitting: Registered Nurse

## 2021-04-30 ENCOUNTER — Encounter (HOSPITAL_COMMUNITY): Payer: Self-pay | Admitting: Registered Nurse

## 2021-04-30 DIAGNOSIS — F1910 Other psychoactive substance abuse, uncomplicated: Secondary | ICD-10-CM

## 2021-04-30 NOTE — Telephone Encounter (Signed)
The therapist attempt to reach  [name] at her mobile number listed in [place] which is "[phone];" however, the woman who answers the phone says, "wrong number" when the therapist asks to speak with [name]. ? ?Adam Phenix, MA, LCSW, [place], LCAS ?

## 2021-04-30 NOTE — ED Provider Notes (Addendum)
Behavioral Health Urgent Care Medical Screening Exam ? ?Patient Name: Jane Watkins [facility] ?MRN: [id number] ?Date of Evaluation: 04/30/21 ?Chief Complaint:   ?Diagnosis:  ?Final diagnoses:  ?Polysubstance abuse (HCC)  ? ? ?History of Present illness: Jane Watkins is a 26 y.o. female patient presented to [facility] as a walk in accompanied by her father requesting resources for detox and rehab services ? ?Jane Watkins, 26 y.o., female patient seen face to face by this provider, consulted with Dr. Nelly Rout; and chart reviewed on 04/30/21.  On evaluation Jane Watkins reports history of stroke in went to emergency room last night related to seizure.  Patient now requesting resources for detox and rehabilitation services.  Patient denies suicidal/self-harm/homicidal ideations, psychosis, paranoia.  Patient gave permission to speak to her father for collateral information.  Patient's father is by her side and reports that his main concern is having something to keep her busy so that she does not start doing drugs again.  Father does not feel the patient is a threat to her life or anyone else's.  Reports they are only looking for rehabilitation services.  Counseling services. ?During evaluation Jane Watkins is sitting upright in no acute distress.  She is alert/oriented x 4; calm/cooperative; and mood congruent with affect.  She is speaking in a clear tone at moderate volume, and normal pace; with good eye contact.  Her thought process is coherent and relevant; There is no indication that she is currently responding to internal/external stimuli or experiencing delusional thought content; and she has denied suicidal/self-harm/homicidal ideation, psychosis, and paranoia.   ?Patient has remained calm throughout assessment and has answered questions appropriately.   ? ? ?Psychiatric Specialty Exam ? ?Presentation  ?General Appearance:Appropriate for Environment ? ?Eye Contact:Good ? ?Speech:Clear and  Coherent; Normal Rate ? ?Speech Volume:Normal ? ?Handedness:Right ? ? ?Mood and Affect  ?Mood:Euthymic ? ?Affect:Appropriate; Congruent ? ? ?Thought Process  ?Thought Processes:Coherent; Goal Directed ? ?Descriptions of Associations:Intact ? ?Orientation:Full (Time, Place and Person) ? ?Thought Content:No data recorded   Hallucinations:None ? ?Ideas of Reference:None ? ?Suicidal Thoughts:No ? ?Homicidal Thoughts:No ? ? ?Sensorium  ?Memory:Immediate Good; Recent Good ? ?Judgment:Intact ? ?Insight:Present ? ? ?Executive Functions  ?Concentration:Good ? ?Attention Span:Good ? ?Recall:Good ? ?Fund of Knowledge:Good ? ?Language:Good ? ? ?Psychomotor Activity  ?Psychomotor Activity:Normal ? ? ?Assets  ?Assets:Communication Skills; Desire for Improvement; Housing; Health and safety inspector; Social Support; Physical Health ? ? ?Sleep  ?Sleep:Good ? ?Number of hours: No data recorded ? ?Nutritional Assessment (For OBS and FBC admissions only) ?Has the patient had a weight loss or gain of 10 pounds or more in the last 3 months?: No ?Has the patient had a decrease in food intake/or appetite?: No ?Does the patient have dental problems?: No ?Does the patient have eating habits or behaviors that may be indicators of an eating disorder including binging or inducing vomiting?: No ?Has the patient recently lost weight without trying?: 0 ?Has the patient been eating poorly because of a decreased appetite?: 0 ?Malnutrition Screening Tool Score: 0 ? ? ? ?Physical Exam: ?Physical Exam ?Vitals and nursing note reviewed. Exam conducted with a chaperone present.  ?Constitutional:   ?   General: She is not in acute distress. ?   Appearance: Normal appearance. She is not ill-appearing.  ?Cardiovascular:  ?   Rate and Rhythm: Normal rate.  ?Pulmonary:  ?   Effort: Pulmonary effort is normal.  ?Musculoskeletal:  ?   Cervical back: Normal range of motion.  ?Skin: ?  General: Skin is warm and dry.  ?Neurological:  ?   Mental Status: She is  alert and oriented to person, place, and time.  ? ?Review of Systems  ?Constitutional: Negative.   ?HENT: Negative.    ?Eyes: Negative.   ?Respiratory: Negative.    ?Cardiovascular: Negative.   ?Gastrointestinal: Negative.   ?Genitourinary: Negative.   ?Musculoskeletal:   ?     Abnormal gait  ?Skin: Negative.   ?Neurological: Negative.   ?     History of stroke  ?Endo/Heme/Allergies: Negative.   ?Psychiatric/Behavioral:  Positive for substance abuse. Negative for hallucinations and suicidal ideas. Depression: Stable.The patient is not nervous/anxious and does not have insomnia.   ?Blood pressure 110/67, pulse 77, temperature 97.8 ?F (36.6 ?C), temperature source Oral, resp. rate 16, SpO2 100 %. There is no height or weight on file to calculate BMI. ? ?Musculoskeletal: ?Strength & Muscle Tone: within normal limits ?Gait & Station: normal ?Patient leans: N/A ? ? ?[facility] MSE Discharge Disposition for Follow up and Recommendations: ?Based on my evaluation the patient does not appear to have an emergency medical condition and can be discharged with resources and follow up care in outpatient services for Medication Management, Substance Abuse Intensive Outpatient Program, and Individual Therapy ? ? ?Discharge Instructions   ? ?  ?You have been referred to the [facility] CD-IOP program, facilitated by Myrna Blazer.  They meet M,W,F from 9-12 in person.  ?Address: [address] [place], [place], [place] [address] ?Phone: [phone] ? ?Other program options: ? ?CD-IOP (Chemical Dependency Intensive Outpatient) ?[facility] ?[address] ?[place], [place] [address] ?Call Jeannett Senior Ringer ?[phone] ? ?Residential Substance Abuse Treatment Options: ? ?[facility] ?Address: [address], [place] [address] ?Phone: [phone] ? ?[facility]- Residential Treatment Program ?[facility]) ?Addiction treatment center in [place] ?Address: [address] [place], [place] [address] ?Phone: [phone] ? ?[facility]  ?[facility] [place] ?Address: [address], [place], [place] [address] ?Phone: [phone] ? ?[facility] ?Addiction treatment center in [place], [place] ?Address: [address] [place], [place] [address] ?Phone: [phone] ? ? ?  ? ? ?Keshana Klemz, NP ?04/30/2021, 3:14 PM ? ?

## 2021-04-30 NOTE — Discharge Instructions (Addendum)
You have been referred to the [facility] CD-IOP program, facilitated by Myrna Blazer.  They meet M,W,F from 9-12 in person.  ?Address: [address] [place], [place], [place] [address] ?Phone: [phone] ? ?Other program options: ? ?CD-IOP (Chemical Dependency Intensive Outpatient) ?[facility] ?[address] ?[place], [place] [address] ?Call Jeannett Senior Ringer ?[phone] ? ?Residential Substance Abuse Treatment Options: ? ?[facility] ?Address: [address] [place], [place] [address] ?Phone: [phone] ? ?[facility]- Residential Treatment Program ?[facility]) ?Addiction treatment center in [place] ?Address: [address], [place], [place] [address] ?Phone: [phone] ? ?[facility]  ?[facility] ?Address: [address], [place], [place] [address] ?Phone: [phone] ? ?[facility] ?Addiction treatment center in [place], [place] ?Address: [address] [place], [place] [address] ?Phone: [phone] ?

## 2021-04-30 NOTE — Telephone Encounter (Signed)
The therapist attempts to reach [place] at her home number listed in [place] leaving a HIPAA-compliant voicemail with the therapist's callback number which is [phone].  ? ?Myrna Blazer, MA, LCSW, LCMHC, LCAS ?

## 2021-04-30 NOTE — BH Assessment (Signed)
Pt to [facility] with her dad looking for detox and SA inpatient treatment. Pt reports using crack daily fort the past two weeks and yesterday she had a seizure which dad feels is triggered by pt drug use. PT reports hx of heroin but has not used since April 2022. Pt denies current [facility] however reports SO about three days ago. ? ?Pt is routine.  ?

## 2021-05-16 ENCOUNTER — Encounter: Payer: Self-pay | Admitting: Adult Health

## 2021-05-16 ENCOUNTER — Ambulatory Visit: Payer: 59 | Admitting: Adult Health
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
## 2021-05-22 ENCOUNTER — Ambulatory Visit: Payer: 59 | Admitting: Obstetrics and Gynecology

## 2021-05-27 ENCOUNTER — Encounter: Payer: Self-pay | Admitting: Obstetrics and Gynecology

## 2021-05-27 ENCOUNTER — Ambulatory Visit (INDEPENDENT_AMBULATORY_CARE_PROVIDER_SITE_OTHER): Payer: 59 | Admitting: Obstetrics and Gynecology

## 2021-05-27 ENCOUNTER — Other Ambulatory Visit (HOSPITAL_COMMUNITY)
Admission: RE | Admit: 2021-05-27 | Discharge: 2021-05-27 | Disposition: A | Discharge status: Home / Self Care | Payer: Medicaid Other | Source: Ambulatory Visit | Attending: Obstetrics and Gynecology | Admitting: Obstetrics and Gynecology

## 2021-05-27 VITALS — BP 114/80 | Ht 62.0 in | Wt 122.0 lb

## 2021-05-27 DIAGNOSIS — R399 Unspecified symptoms and signs involving the genitourinary system: Secondary | ICD-10-CM

## 2021-05-27 DIAGNOSIS — N921 Excessive and frequent menstruation with irregular cycle: Secondary | ICD-10-CM | POA: Diagnosis not present

## 2021-05-27 DIAGNOSIS — N898 Other specified noninflammatory disorders of vagina: Secondary | ICD-10-CM | POA: Diagnosis not present

## 2021-05-27 DIAGNOSIS — Z124 Encounter for screening for malignant neoplasm of cervix: Secondary | ICD-10-CM

## 2021-05-27 DIAGNOSIS — Z113 Encounter for screening for infections with a predominantly sexual mode of transmission: Secondary | ICD-10-CM | POA: Insufficient documentation

## 2021-05-27 DIAGNOSIS — Z30431 Encounter for routine checking of intrauterine contraceptive device: Secondary | ICD-10-CM

## 2021-05-27 LAB — POCT WET PREP WITH KOH
Clue Cells Wet Prep HPF POC: NEGATIVE
KOH Prep POC: NEGATIVE
Trichomonas, UA: NEGATIVE
Yeast Wet Prep HPF POC: NEGATIVE

## 2021-05-27 LAB — POCT URINALYSIS DIPSTICK
Bilirubin, UA: NEGATIVE
Blood, UA: NEGATIVE
Glucose, UA: NEGATIVE
Ketones, UA: NEGATIVE
Nitrite, UA: NEGATIVE
Protein, UA: NEGATIVE
Spec Grav, UA: 1.025 (ref 1.010–1.025)
Urobilinogen, UA: 0.2 E.U./dL
pH, UA: 5 (ref 5.0–8.0)

## 2021-05-27 LAB — POCT URINE PREGNANCY: Preg Test, Ur: NEGATIVE

## 2021-05-27 NOTE — Progress Notes (Signed)
? ? ?[name], MD ? ? ?Chief Complaint  ?Patient presents with  ? IUD check  ?  Started having heavy cycles 3 months ago, severe cramping  ? Urinary Tract Infection  ?  Frequency and burning urinating  ? Vaginal Discharge  ?  Itching, fishy odor  ? ? ?HPI: ?     [name] Jane Watkins is a 26 y.o. G0P0000 whose LMP was Patient's last menstrual period was 05/22/2021 (approximate)., presents today for menometrorrhagia that started 3 months ago. Paragard placed 07/06/19. Menses were monthly, lasting 2-4 days, mod flow, no BTB, mild dysmen, improved with NSAIDs. Now menses still monthly, lasting 7-12 days, heavier flow, with clots, with BTB and worsening dysmen, not improved with NSAIDs. Having to miss several days of activities due to pain.  ?Neg STD testing 5/21. She is sex active, with new partner. Had pain with sex about 1-2 months ago that triggered bleeding for 3 days. ?Also with increased vag d/c off and on the past 2-3 wks, no itching, occas odor. No hx of BV. No recent abx use, no meds to treat. ?Also with urinary frequency, dysuria, no hematuria, intermittently past 2-3 wks. No pelvic pain, fevers, unusual LBP. Hx of UTI in past.  ?Pt is s/p CVA.  ? ?Patient Active Problem List  ? Diagnosis Date Noted  ? 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 Postcoital UTI 05/24/2019  ? Spastic hemiparesis (HCC)   ? Vascular headache   ? Mood disorder in conditions classified elsewhere   ? 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  ? Opioid use disorder, mild, in sustained remission (HCC)   ? Dyslipidemia   ? Ischemic stroke diagnosed during current admission (HCC) 02/13/2019  ? MDD (major depressive disorder), recurrent, in full remission (HCC) 02/10/2019  ? CVA (cerebral vascular accident) (HCC) 02/07/2019  ? Polysubstance abuse (HCC) 09/20/2017  ? TBI (traumatic brain injury) (HCC) 05/01/2016  ? 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  ? Elevated troponin   ? ? ?Past Surgical History:  ?Procedure  Laterality Date  ? APPENDECTOMY  04/2013  ? I & D EXTREMITY Left 09/20/2017  ? Procedure: IRRIGATION AND DEBRIDEMENT LEFT ARM;  Surgeon: Bradly Bienenstock, MD;  Location: [facility] OR;  Service: Orthopedics;  Laterality: Left;  ? TEE WITHOUT CARDIOVERSION N/A 02/10/2019  ? Procedure: TRANSESOPHAGEAL ECHOCARDIOGRAM (TEE);  Surgeon: Debbe Odea, MD;  Location: [facility] ORS;  Service: Cardiovascular;  Laterality: N/A;  Polysubstance abuser  ? TONGUE SURGERY  ~ 2007  ? "related to lisp"  ? TRACHEOSTOMY  04/2016  ? [name] 05/01/2016  ? ? ?Family History  ?Problem Relation Age of Onset  ? Anemia Father   ? Healthy Sister   ? Thyroid cancer Maternal Grandmother   ? Lung cancer Maternal Grandfather   ?     metastasized  ? Lung cancer Paternal Grandfather   ? ? ?Social History  ? ?Socioeconomic History  ? Marital status: Significant Other  ?  Spouse name: Not on file  ? Number of children: Not on file  ? Years of education: high school  ? Highest education level: Not on file  ?Occupational History  ? Not on file  ?Tobacco Use  ? Smoking status: Every Day  ?  Packs/day: 0.50  ?  Years: 10.00  ?  Pack years: 5.00  ?  Types: Cigarettes  ? Smokeless tobacco: Never  ?Vaping Use  ? Vaping Use: Never used  ?Substance and Sexual Activity  ? Alcohol use: No  ? Drug use: Yes  ?  Types: IV, Heroin, Cocaine, Marijuana  ?  Comment: last use of heroin and cocaine was 01/2019, marijuana daily  ? Sexual activity: Yes  ?  Birth control/protection: I.U.D.  ?  Comment: Paragard  ?Other Topics Concern  ? Not on file  ?Social History Narrative  ? 05/24/19  ? From: [place], [place]  ? Living: with boyfriend Jane Batten([name]) -- former husband died from overdose  ? Work: not currently, in the application process for disability  ?   ? Family: good relationship with dad who lives in [place], mom [place] in [place]. [place] with bother and sister  ?   ? Enjoys: training her puppy (corndog)  ?   ? Exercise: walking, exercise bike in the apartment  ? Diet: not the best, tries  to cook at home, fruit/veggies  ?   ? Safety  ? Seat belts: Yes   ? Guns: No  ? Safe in relationships: Yes   ? ?Social Determinants of Health  ? ?Financial Resource Strain: Not on file  ?Food Insecurity: Not on file  ?Transportation Needs: Not on file  ?Physical Activity: Not on file  ?Stress: Not on file  ?Social Connections: Not on file  ?Intimate Partner Violence: Not on file  ? ? ?Outpatient Medications Prior to Visit  ?Medication Sig Dispense Refill  ? acetaminophen (TYLENOL) 325 MG tablet Take 2 tablets (650 mg total) by mouth every 4 (four) hours as needed for mild pain (or temp > 37.5 C (99.5 F)).    ? aspirin EC 81 MG tablet Take 1 tablet (81 mg total) by mouth daily. Swallow whole. 90 tablet 3  ? atorvastatin (LIPITOR) 20 MG tablet Take 1 tablet (20 mg total) by mouth daily at 6 PM. 90 tablet 3  ? baclofen (LIORESAL) 10 MG tablet Take 1 tablet (10 mg total) by mouth 3 (three) times daily. 90 tablet 1  ? benzoyl peroxide 5 % external liquid Apply topically 2 (two) times daily.    ? buprenorphine-naloxone (SUBOXONE) 8-2 mg SUBL SL tablet Place 1 tablet under the tongue 3 (three) times daily.    ? busPIRone (BUSPAR) 10 MG tablet TAKE 1 TABLET BY MOUTH THREE TIMES A DAY (Patient taking differently: Takes as needed) 270 tablet 2  ? Clindamycin-Benzoyl Per, Refr, gel Apply 1 application topically daily.    ? clindamycin-benzoyl peroxide (BENZACLIN) gel As directed    ? doxycycline (VIBRA-TABS) 100 MG tablet Take 100 mg by mouth 2 (two) times daily.    ? DULoxetine (CYMBALTA) 30 MG capsule Take 1 capsule (30 mg total) by mouth daily. (Patient taking differently: Take 90 mg by mouth daily.) 30 capsule 5  ? escitalopram (LEXAPRO) 10 MG tablet Take 1 tablet (10 mg total) by mouth daily. 30 tablet 3  ? esomeprazole (NEXIUM) 40 MG capsule Take 1 capsule (40 mg total) by mouth daily at 12 noon. 30 capsule 1  ? gabapentin (NEURONTIN) 300 MG capsule Take 1 capsule (300 mg total) by mouth at bedtime. 90 capsule 3  ?  nicotine (NICODERM CQ - DOSED IN MG/24 HOURS) 21 mg/24hr patch Place 1 patch (21 mg total) onto the skin daily. 28 patch 3  ? propranolol (INDERAL) 10 MG tablet Take 1 tablet (10 mg total) by mouth daily as needed. 30 tablet 3  ? QUEtiapine (SEROQUEL) 200 MG tablet Take 1 tablet (200 mg total) by mouth at bedtime. 30 tablet 2  ? QUEtiapine (SEROQUEL) 50 MG tablet Take 4 tablets (200 mg total) by mouth at bedtime. 60  tablet 3  ? spironolactone (ALDACTONE) 50 MG tablet Take 1 tablet (50 mg total) by mouth 2 (two) times daily. 60 tablet 3  ? SUMAtriptan (IMITREX) 50 MG tablet Take 1 tablet (50 mg total) by mouth every 2 (two) hours as needed for migraine. May repeat in 2 hours if headache persists or recurs. 10 tablet 0  ? tiZANidine (ZANAFLEX) 4 MG tablet Take 1 tablet (4 mg total) by mouth 3 (three) times daily. 270 tablet 0  ? topiramate (TOPAMAX) 50 MG tablet Take 2 tablets (100 mg total) by mouth daily as needed (for acute migraine relief). 60 tablet 3  ? tretinoin (RETIN-A) 0.05 % cream As directed    ? pantoprazole (PROTONIX) 40 MG tablet Take 1 tablet (40 mg total) by mouth daily. 30 tablet 0  ? ?Facility-Administered Medications Prior to Visit  ?Medication Dose Route Frequency Provider Last Rate Last Admin  ? paragard intrauterine copper IUD   Intrauterine Once Gavino Fouch B, PA-C      ? ? ? ? ?ROS: ? ?Review of Systems  ?Constitutional:  Negative for fever.  ?Gastrointestinal:  Negative for blood in stool, constipation, diarrhea, nausea and vomiting.  ?Genitourinary:  Positive for dysuria, frequency, menstrual problem, pelvic pain and vaginal discharge. Negative for dyspareunia, flank pain, hematuria, urgency, vaginal bleeding and vaginal pain.  ?Musculoskeletal:  Negative for back pain.  ?Skin:  Negative for rash.  ?BREAST: No symptoms ? ? ?OBJECTIVE:  ? ?Vitals:  ?BP 114/80   Ht 5\' 2"  (1.575 m)   Wt 122 lb (55.3 kg)   LMP 05/22/2021 (Approximate)   BMI 22.31 kg/m?  ? ?Physical Exam ?Vitals reviewed.   ?Constitutional:   ?   Appearance: She is well-developed.  ?Pulmonary:  ?   Effort: Pulmonary effort is normal.  ?Genitourinary: ?   General: Normal vulva.  ?   Pubic Area: No rash.   ?   Labia:     ?   Right:

## 2021-05-29 LAB — URINE CULTURE

## 2021-05-29 MED ORDER — AMOXICILLIN 500 MG PO CAPS: 500.0000 mg | ORAL_CAPSULE | Freq: Two times a day (BID) | ORAL | 0 refills | Status: AC

## 2021-05-29 NOTE — Addendum Note (Signed)
Addended by: Althea Grimmer B on: 05/29/2021 09:35 AM ? ? Modules accepted: Orders ? ?

## 2021-05-30 LAB — CYTOLOGY - PAP
Chlamydia: NEGATIVE
Comment: NEGATIVE
Comment: NEGATIVE
Comment: NORMAL
Diagnosis: UNDETERMINED — AB
High risk HPV: POSITIVE — AB
Neisseria Gonorrhea: NEGATIVE

## 2021-06-19 ENCOUNTER — Telehealth: Payer: Self-pay

## 2021-06-19 NOTE — Telephone Encounter (Signed)
Pt left msg on triage requesting RF on amoxicillin that was sent 05/29/21. Called pt back and she said she needed the Rx to be transferred to a closer pharmacy at that time. When she got there Rx was no where to be found. I called [facility] on [address] and was advised [facility] [address] [facility] [phone]. I will call first thing in the morning tomorrow since I have to clock out at 450 due to zero OT allowed.  ?

## 2021-06-20 NOTE — Progress Notes (Signed)
Pls call pt to schedule GYN u/s for heavy periods/IUD placement. I have not been able to get in touch with her so pls let her know since other testing neg, u/s is next step that we discussed at her appt. I also still need to talk to her about her abn pap and colpo, so pls tell pt I'll call her next wk when back in office. Thx.

## 2021-06-20 NOTE — Telephone Encounter (Signed)
[facility] [address], [place],  [address] [phone] ready for pick up. Called pt to let her know, taken straight to voicemail. Voicemail not set up, couldn't leave voice msg. ?

## 2021-06-20 NOTE — Addendum Note (Signed)
Addended by: Althea Grimmer B on: 06/20/2021 05:26 PM ? ? Modules accepted: Orders ? ?

## 2021-06-26 ENCOUNTER — Encounter: Payer: Self-pay | Admitting: Obstetrics

## 2021-06-26 ENCOUNTER — Encounter: Payer: Self-pay | Admitting: Physical Medicine and Rehabilitation

## 2021-06-26 ENCOUNTER — Encounter: Payer: Self-pay | Attending: Physical Medicine and Rehabilitation | Admitting: Physical Medicine and Rehabilitation

## 2021-06-26 ENCOUNTER — Telehealth: Payer: Self-pay

## 2021-06-26 NOTE — Telephone Encounter (Signed)
Spoke w/patient. Inquired what she has tried. She has used a heating pad and has tried the following: Ibuprofen 600mg  q 4-6 hr., Midol 2 tab q 2-4 hr., Pamperin 2 tab q 2-4 hr. is out of the office until tomorrow. Will send to provider on call. ?

## 2021-06-26 NOTE — Telephone Encounter (Signed)
Patient has ultrasound scheduled for 07/04/21 for lost iud strings. Her intense cramps and bleeding are becoming unbearable. Inquiring if there is anything that can be prescribed to help with this. JQ#[phone]. ?

## 2021-06-27 NOTE — Telephone Encounter (Signed)
Called and left voicemail for patient to call back to be scheduled. Per Copland, Alicia B, PA-C  P Ws-Admin Pls call pt to schedule colpo with MD. She is aware. Thx.

## 2021-06-27 NOTE — Progress Notes (Signed)
Pls call pt to schedule colpo with MD. She is aware. Thx.

## 2021-06-27 NOTE — Telephone Encounter (Signed)
Pls see if pt can get GYN u/s done sooner due to pain (prob not, I know). You can discuss with her when call with colpo appt. Thx.

## 2021-06-27 NOTE — Telephone Encounter (Signed)
Spoke with pt. Pain better today. Jane Watkins had suggested trying to get u/s sooner. Will have scheduler check this. MMF also mentioned Mirena IUD. Pt with hx of CVA so Mirena is slightly more risk for CVA than Paragard but still appropriate. Need to be careful of other hormones though.

## 2021-07-01 NOTE — Telephone Encounter (Signed)
Do to network difficulties the call could not be completed at this time. I was unable to reach patient.

## 2021-07-02 NOTE — Telephone Encounter (Signed)
They have been multiple attempts to reach patient which have been unsuccessful. Please advise?

## 2021-07-02 NOTE — Telephone Encounter (Signed)
You have to continue to try....she is not good about answering phone. I tried to get her for a month!

## 2021-07-02 NOTE — Telephone Encounter (Signed)
Called and left voicemail for patient to call back to be scheduled. 

## 2021-07-04 ENCOUNTER — Ambulatory Visit: Payer: 59

## 2021-07-04 DIAGNOSIS — Z30431 Encounter for routine checking of intrauterine contraceptive device: Secondary | ICD-10-CM

## 2021-07-04 DIAGNOSIS — N921 Excessive and frequent menstruation with irregular cycle: Secondary | ICD-10-CM

## 2021-07-04 NOTE — Telephone Encounter (Signed)
I contacted patient via phone. I attempted to scheduled but patient states " the things turned out for the worst last night and I am on my way to the hospital". I asked about her schedule ultrasound for today patient requested to cancel. Patient states she doesn't know when she will rescheduled ultrasound or when she will be able to scheduled Colposcopy.

## 2021-07-14 ENCOUNTER — Encounter: Payer: Self-pay | Admitting: Obstetrics and Gynecology

## 2021-07-29 ENCOUNTER — Ambulatory Visit: Payer: 59 | Admitting: Family Medicine

## 2021-07-29 ENCOUNTER — Telehealth: Payer: Self-pay | Admitting: Family Medicine

## 2021-07-29 NOTE — Telephone Encounter (Signed)
Called pt to reschedule the 6/19 appointment with Dr. Alvester Morin.  Left message for pt to call back to reschedule.

## 2021-07-30 ENCOUNTER — Encounter: Payer: Self-pay | Admitting: Family Medicine

## 2021-07-30 NOTE — Telephone Encounter (Signed)
Reached out to pt to reschedule the missed 6/19 appointment (colpo) with Dr. Alvester Morin.  Left message for pt to call back to reschedule.  Will send a MyChart letter to contact the pt.

## 2021-08-01 ENCOUNTER — Telehealth: Payer: Self-pay | Admitting: Obstetrics and Gynecology

## 2021-08-01 ENCOUNTER — Ambulatory Visit (INDEPENDENT_AMBULATORY_CARE_PROVIDER_SITE_OTHER): Payer: 59

## 2021-08-01 DIAGNOSIS — Z30431 Encounter for routine checking of intrauterine contraceptive device: Secondary | ICD-10-CM

## 2021-08-01 DIAGNOSIS — R102 Pelvic and perineal pain: Secondary | ICD-10-CM | POA: Diagnosis not present

## 2021-08-01 NOTE — Telephone Encounter (Signed)
Pt aware of neg Gyn u/s results for menometrorrhagia for a few month with paragard IUD. IUD in correct location, normal uterus/ovaries. Sx are slightly improved. Pt wants to follow expectantly for now.  UTI and vag d/c sx resolved.  Has colpo appt 08/27/21.

## 2021-08-10 ENCOUNTER — Other Ambulatory Visit: Payer: Self-pay | Admitting: Physical Medicine and Rehabilitation

## 2021-08-10 MED ORDER — PROPRANOLOL HCL 10 MG PO TABS: 10.0000 mg | ORAL_TABLET | Freq: Every day | ORAL | 3 refills | Status: DC | PRN

## 2021-08-10 MED ORDER — BACLOFEN 10 MG PO TABS: 10.0000 mg | ORAL_TABLET | Freq: Three times a day (TID) | ORAL | 1 refills | Status: DC

## 2021-08-19 ENCOUNTER — Encounter: Payer: Self-pay | Admitting: Physical Medicine and Rehabilitation

## 2021-08-20 ENCOUNTER — Encounter: Payer: Self-pay | Admitting: Physical Medicine and Rehabilitation

## 2021-08-20 DIAGNOSIS — F1121 Opioid dependence, in remission: Secondary | ICD-10-CM | POA: Diagnosis not present

## 2021-08-20 DIAGNOSIS — F1420 Cocaine dependence, uncomplicated: Secondary | ICD-10-CM | POA: Diagnosis not present

## 2021-08-20 DIAGNOSIS — R69 Illness, unspecified: Secondary | ICD-10-CM | POA: Diagnosis not present

## 2021-08-21 ENCOUNTER — Encounter: Payer: Self-pay | Admitting: Physical Medicine and Rehabilitation

## 2021-08-27 ENCOUNTER — Encounter: Payer: Self-pay | Admitting: Family Medicine

## 2021-08-27 ENCOUNTER — Other Ambulatory Visit (HOSPITAL_COMMUNITY)
Admission: RE | Admit: 2021-08-27 | Discharge: 2021-08-27 | Disposition: A | Discharge status: Home / Self Care | Payer: Medicare HMO | Source: Ambulatory Visit | Attending: Family Medicine | Admitting: Family Medicine

## 2021-08-27 ENCOUNTER — Ambulatory Visit (INDEPENDENT_AMBULATORY_CARE_PROVIDER_SITE_OTHER): Payer: Medicare HMO | Admitting: Family Medicine

## 2021-08-27 VITALS — BP 132/80 | HR 95 | Ht 62.0 in | Wt 122.0 lb

## 2021-08-27 DIAGNOSIS — R87610 Atypical squamous cells of undetermined significance on cytologic smear of cervix (ASC-US): Secondary | ICD-10-CM | POA: Insufficient documentation

## 2021-08-27 DIAGNOSIS — R87810 Cervical high risk human papillomavirus (HPV) DNA test positive: Secondary | ICD-10-CM | POA: Insufficient documentation

## 2021-08-27 DIAGNOSIS — R69 Illness, unspecified: Secondary | ICD-10-CM | POA: Diagnosis not present

## 2021-08-27 DIAGNOSIS — R350 Frequency of micturition: Secondary | ICD-10-CM | POA: Diagnosis not present

## 2021-08-27 DIAGNOSIS — N870 Mild cervical dysplasia: Secondary | ICD-10-CM | POA: Diagnosis not present

## 2021-08-27 LAB — POCT URINALYSIS DIPSTICK
Appearance: ABNORMAL
Bilirubin, UA: NEGATIVE
Blood, UA: NEGATIVE
Glucose, UA: NEGATIVE
Ketones, UA: NEGATIVE
Leukocytes, UA: NEGATIVE
Nitrite, UA: NEGATIVE
Odor: NORMAL
Protein, UA: NEGATIVE
Spec Grav, UA: 1.015 (ref 1.010–1.025)
Urobilinogen, UA: 0.2 E.U./dL
pH, UA: 7 (ref 5.0–8.0)

## 2021-08-27 NOTE — Progress Notes (Signed)
     GYNECOLOGY OFFICE COLPOSCOPY PROCEDURE NOTE  26 y.o. G0P0000 here for colposcopy for ASCUS with POSITIVE high risk HPV pap smear on 05/23/21. Discussed role for HPV in cervical dysplasia, need for surveillance.  Patient gave informed written consent, time out was performed.  Placed in lithotomy position. Cervix viewed with speculum and colposcope after application of acetic acid.   Physical Exam Exam conducted with a chaperone present.  Genitourinary:    Exam position: Lithotomy position.     Labia:        Right: No rash or tenderness.        Left: No rash or tenderness.      Vagina: Normal.        Comments: Accetowhite linear lesion on 11 oclock and more circular area at 3 oclock  IUD strings present    Colposcopy adequate? Yes  acetowhite lesion(s) noted at 11 o'clock and 3 o'clock corresponding biopsies obtained.  ECC specimen obtained. All specimens were labeled and sent to pathology.  Chaperone was present during entire procedure.  Patient was given post procedure instructions.  Will follow up pathology and manage accordingly; patient will be contacted with results and recommendations.  Routine preventative health maintenance measures emphasized. Recommended patient check her vaccine records and consider HPV vaccination to help eradicate the virus. Also reduction and cessation of tobacco would help immune system.

## 2021-08-29 LAB — SURGICAL PATHOLOGY

## 2021-09-03 ENCOUNTER — Encounter: Payer: Self-pay | Admitting: Family Medicine

## 2021-09-03 ENCOUNTER — Telehealth: Payer: Self-pay

## 2021-09-03 NOTE — Telephone Encounter (Signed)
[facility] sent a fax for clarification on Propranolol, Baclofen and Buspirone. Called and spoke to pharmacist to clarify strengths and doses of each medication

## 2021-09-26 ENCOUNTER — Other Ambulatory Visit (HOSPITAL_COMMUNITY): Payer: Self-pay

## 2021-09-26 ENCOUNTER — Other Ambulatory Visit: Payer: Self-pay | Admitting: Physical Medicine and Rehabilitation

## 2021-09-27 ENCOUNTER — Other Ambulatory Visit (HOSPITAL_COMMUNITY): Payer: Self-pay

## 2021-09-27 MED ORDER — TIZANIDINE HCL 4 MG PO TABS
4.0000 mg | ORAL_TABLET | Freq: Three times a day (TID) | ORAL | 0 refills | Status: DC
  Filled 2021-09-27: qty 270, 90d supply, fill #0

## 2021-10-01 ENCOUNTER — Other Ambulatory Visit (HOSPITAL_COMMUNITY): Payer: Self-pay

## 2021-10-07 ENCOUNTER — Ambulatory Visit: Payer: Self-pay | Admitting: Physical Medicine and Rehabilitation

## 2021-10-11 ENCOUNTER — Encounter: Payer: Self-pay | Admitting: Physical Medicine and Rehabilitation

## 2021-10-11 ENCOUNTER — Encounter: Payer: Medicare HMO | Attending: Physical Medicine and Rehabilitation | Admitting: Physical Medicine and Rehabilitation

## 2021-10-11 VITALS — BP 120/85 | HR 100 | Temp 97.9°F | Wt 121.0 lb

## 2021-10-11 DIAGNOSIS — G8110 Spastic hemiplegia affecting unspecified side: Secondary | ICD-10-CM

## 2021-10-11 DIAGNOSIS — G8114 Spastic hemiplegia affecting left nondominant side: Secondary | ICD-10-CM | POA: Diagnosis not present

## 2021-10-11 DIAGNOSIS — I69354 Hemiplegia and hemiparesis following cerebral infarction affecting left non-dominant side: Secondary | ICD-10-CM | POA: Insufficient documentation

## 2021-10-11 MED ORDER — PROPRANOLOL HCL 10 MG PO TABS: 10.0000 mg | ORAL_TABLET | Freq: Every day | ORAL | 3 refills | Status: DC | PRN

## 2021-10-11 MED ORDER — TIZANIDINE HCL 4 MG PO TABS: 4.0000 mg | ORAL_TABLET | Freq: Three times a day (TID) | ORAL | 0 refills | Status: DC

## 2021-10-11 MED ORDER — GABAPENTIN 300 MG PO CAPS
300.0000 mg | ORAL_CAPSULE | Freq: Every day | ORAL | 3 refills | Status: DC
Start: 2021-10-11 — End: 2021-10-23

## 2021-10-11 MED ORDER — QUETIAPINE FUMARATE 50 MG PO TABS: 50.0000 mg | ORAL_TABLET | Freq: Every day | ORAL | 3 refills | Status: DC

## 2021-10-11 MED ORDER — ONABOTULINUMTOXINA 100 UNITS IJ SOLR
400.0000 [IU] | Freq: Once | INTRAMUSCULAR | Status: AC
  Administered 2021-10-11: 400 [IU] via INTRAMUSCULAR

## 2021-10-11 NOTE — Progress Notes (Signed)
  Assessment & Plan:  Botox Injection for spasticity using needle ultrasound guidance  Dilution: 1:1 Indication: Severe spasticity which interferes with ADL,mobility and/or  hygiene and is unresponsive to medication management and other conservative care Informed consent was obtained after describing risks and benefits of the procedure with the patient. This includes bleeding, bruising, infection, excessive weakness, or medication side effects. A REMS form is on file and signed.  Left flexor pollicis longus: 20U in 1 site Left flexor pollicis brevis: 25U in 1 site Left opponens pollicis:25 U in 1 site Left adductor pollicis: 20U in 1 site Left Flexor digitorus profundus: 50U Left flexor digitorum superficialis: 50U Flexor carpi radialis: 50U Flexor carpi ulnaris: 50U  Biceps: 110U   All injections were done after obtaining appropriate Ultrasound visualization and after negative drawback for blood. The patient tolerated the procedure well. Post procedure instructions were given. A followup appointment was made.

## 2021-10-13 NOTE — Progress Notes (Unsigned)
   No chief complaint on file.    History of Present Illness:  Jane Watkins is a 26 y.o. that had a {IUD:23561} IUD placed approximately {NUMBERS:20191} {MONTH/YR:310907} ago. Since that time, she denies dyspareunia, pelvic pain, non-menstrual bleeding, vaginal d/c, heavy bleeding.  menometrorrhagia that started 3 months ago. Paragard placed 07/06/19. Menses were monthly, lasting 2-4 days, mod flow, no BTB, mild dysmen, improved with NSAIDs. Now menses still monthly, lasting 7-12 days, heavier flow, with clots, with BTB and worsening dysmen, not improved with NSAIDs. Having to miss several days of activities due to pain Normal GYN u/s 6/23  There were no vitals taken for this visit.  Pelvic exam:  Two IUD strings {DESC; PRESENT/ABSENT:17923} seen coming from the cervical os. EGBUS, vaginal vault and cervix: within normal limits  IUD Removal Strings of IUD identified and grasped.  IUD removed without problem with ring forceps.  Pt tolerated this well.  IUD noted to be intact.  Assessment:  No diagnosis found.    Plan: IUD removed and plan for contraception is {PLAN CONTRACEPTION:313102}. She was amenable to this plan.  Kaijah Abts B. Alexx Giambra, PA-C 10/13/2021 10:11 AM

## 2021-10-15 ENCOUNTER — Encounter: Payer: Self-pay | Admitting: Obstetrics and Gynecology

## 2021-10-15 ENCOUNTER — Ambulatory Visit (INDEPENDENT_AMBULATORY_CARE_PROVIDER_SITE_OTHER): Payer: Medicare HMO | Admitting: Obstetrics and Gynecology

## 2021-10-15 VITALS — BP 100/60 | Ht 62.0 in | Wt 127.0 lb

## 2021-10-15 DIAGNOSIS — Z30432 Encounter for removal of intrauterine contraceptive device: Secondary | ICD-10-CM | POA: Diagnosis not present

## 2021-10-15 DIAGNOSIS — I6330 Cerebral infarction due to thrombosis of unspecified cerebral artery: Secondary | ICD-10-CM

## 2021-10-15 DIAGNOSIS — Z3009 Encounter for other general counseling and advice on contraception: Secondary | ICD-10-CM

## 2021-10-15 NOTE — Patient Instructions (Signed)
I value your feedback and you entrusting us with your care. If you get a [facility] patient survey, I would appreciate you taking the time to let us know about your experience today. Thank you! ? ? ?

## 2021-10-23 ENCOUNTER — Other Ambulatory Visit: Payer: Self-pay | Admitting: Physical Medicine and Rehabilitation

## 2021-10-23 MED ORDER — PROPRANOLOL HCL 10 MG PO TABS: 10.0000 mg | ORAL_TABLET | Freq: Every day | ORAL | 3 refills | Status: DC | PRN

## 2021-10-23 MED ORDER — GABAPENTIN 300 MG PO CAPS: 300.0000 mg | ORAL_CAPSULE | Freq: Every day | ORAL | 3 refills | Status: DC

## 2021-10-23 MED ORDER — QUETIAPINE FUMARATE 50 MG PO TABS
50.0000 mg | ORAL_TABLET | Freq: Every day | ORAL | 3 refills | Status: DC
Start: 2021-10-23 — End: 2022-01-27

## 2021-10-23 MED ORDER — TIZANIDINE HCL 4 MG PO TABS: 4.0000 mg | ORAL_TABLET | Freq: Three times a day (TID) | ORAL | 0 refills | Status: DC

## 2021-11-05 NOTE — Progress Notes (Signed)
This encounter was created in error - please disregard.

## 2021-11-06 ENCOUNTER — Other Ambulatory Visit: Payer: Self-pay

## 2021-11-06 ENCOUNTER — Ambulatory Visit: Payer: Medicare HMO | Admitting: Physical Therapy

## 2021-11-06 ENCOUNTER — Ambulatory Visit: Payer: Medicare HMO | Attending: Physical Medicine and Rehabilitation | Admitting: Occupational Therapy

## 2021-11-06 DIAGNOSIS — R278 Other lack of coordination: Secondary | ICD-10-CM | POA: Insufficient documentation

## 2021-11-06 DIAGNOSIS — R41840 Attention and concentration deficit: Secondary | ICD-10-CM | POA: Diagnosis not present

## 2021-11-06 DIAGNOSIS — R2681 Unsteadiness on feet: Secondary | ICD-10-CM | POA: Diagnosis not present

## 2021-11-06 DIAGNOSIS — M6281 Muscle weakness (generalized): Secondary | ICD-10-CM | POA: Diagnosis not present

## 2021-11-06 DIAGNOSIS — I69352 Hemiplegia and hemiparesis following cerebral infarction affecting left dominant side: Secondary | ICD-10-CM | POA: Insufficient documentation

## 2021-11-06 NOTE — Therapy (Signed)
OUTPATIENT OCCUPATIONAL THERAPY NEURO EVALUATION  Patient Name: Jane Watkins MRN: [id number] DOB:Jul 17, 1995, 26 y.o., female Today's Date: 11/06/2021  PCP: Gweneth Dimitri R,MD REFERRING PROVIDER: Horton Chin, MD    OT End of Session - 11/06/21 1653     Visit Number 1    Number of Visits 13    Date for OT Re-Evaluation 01/03/22    Authorization Type [employer]    OT Start Time 1501    OT Stop Time 1535    OT Time Calculation (min) 34 min             Past Medical History:  Diagnosis Date   AKI (acute kidney injury) (HCC) 2018   "from overdose"   Anxiety    Chlamydia    Daily headache    Depression    Drug overdose 04/2016   [name] 05/01/2016   GERD (gastroesophageal reflux disease)    "when I was younger; gone now" (09/21/2017)   IV drug abuse (HCC) 09/20/2017   Migraine    "q couple weeks" (09/21/2017)   Opioid abuse (HCC)    Overdose    Stroke [facility])    Past Surgical History:  Procedure Laterality Date   APPENDECTOMY  04/2013   I & D EXTREMITY Left 09/20/2017   Procedure: IRRIGATION AND DEBRIDEMENT LEFT ARM;  Surgeon: Bradly Bienenstock, MD;  Location: [facility] OR;  Service: Orthopedics;  Laterality: Left;   TEE WITHOUT CARDIOVERSION N/A 02/10/2019   Procedure: TRANSESOPHAGEAL ECHOCARDIOGRAM (TEE);  Surgeon: Debbe Odea, MD;  Location: [facility] ORS;  Service: Cardiovascular;  Laterality: N/A;  Polysubstance abuser   TONGUE SURGERY  ~ 2007   "related to lisp"   TRACHEOSTOMY  04/2016   [name] 05/01/2016   Patient Active Problem List   Diagnosis Date Noted   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   Postcoital UTI 05/24/2019   Spastic hemiparesis (HCC)    Vascular headache    Mood disorder in conditions classified elsewhere    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   Opioid use disorder, mild, in sustained remission (HCC)    Dyslipidemia    Ischemic stroke diagnosed during current admission (HCC) 02/13/2019   MDD (major depressive disorder),  recurrent, in full remission (HCC) 02/10/2019   CVA (cerebral vascular accident) (HCC) 02/07/2019   Polysubstance abuse (HCC) 09/20/2017   TBI (traumatic brain injury) (HCC) 05/01/2016   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     ONSET DATE: referral date 10/11/21  REFERRING DIAG: G81.10 (ICD-10-CM) - Spastic hemiparesis   THERAPY DIAG:  Spastic hemiplegia of left dominant side as late effect of cerebral infarction (HCC)  Muscle weakness (generalized)  Other lack of coordination  Attention and concentration deficit  Unsteadiness on feet  Rationale for Evaluation and Treatment Rehabilitation  SUBJECTIVE:   SUBJECTIVE STATEMENT: Pt reports that she recently received Botox injection in her L hand and is pursuing OT as she would like more function out of her hand, specifically individual movements of fingers.  Pt reports difficulty with releasing grasp on items as her hand is very tight.  She would like to be able to utilize LUE to aid in putting her hair up in pony tail. 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     PRECAUTIONS: Fall  WEIGHT BEARING RESTRICTIONS  No  PAIN:  Are you having pain? No  FALLS: Has patient fallen in last 6 months? Yes. Number of falls 10  Pt reports falling on uneven surfaces, falling on stairs.  She reports that she sometimes gets dizzy and tends to fall over.  LIVING ENVIRONMENT: Lives with:  father and his girlfriend Lives in: House/apartment Stairs: Yes: External: 5 steps; on right going up Has following equipment at home: Environmental consultant - 2 wheeled, [place], shower chair, Shower bench, and Grab bars  PLOF: Independent  PATIENT GOALS To use Left hand more  functionally, especially to pull back hair  OBJECTIVE:   HAND DOMINANCE: Left  ADLs: Transfers/ambulation related to ADLs:Mod I without use of AD Eating: uses rocker knife to cut foods, unable to use L hand to stabilize bowl/plate during eating Grooming: completes all tasks with R hand only, unable to pull hair back in pony tail UB Dressing: occasional assist with donning bra and pulling off shirt LB Dressing: reports pants can be difficult, unable to tie shoes Toileting: increased time and difficulty with pulling up pants, unable to button pants Bathing: Mod I Tub Shower transfers: Mod I, does use shower chair sometimes  Equipment: Shower seat with back, Transfer tub bench, Grab bars, and Long handled shoe horn   IADLs: Shopping: online order and then pick it up Light housekeeping: Mod I with laundry, dishes with increased time and effort, assist to make the bed Meal Prep: Making sandwich, pouring bowl of cereal and use of microwave, does not usually use stove or oven Community mobility: driving Medication management: Mod I Financial management: Mod I  MOBILITY STATUS: Hx of falls  POSTURE COMMENTS:  No Significant postural limitations Sitting balance: Moves/returns truncal midpoint 1-2 inches in multiple planes  ACTIVITY TOLERANCE: Activity tolerance: WFL for tasks assessed during abbreviated eval; restless throughout session  FUNCTIONAL OUTCOME MEASURES: Quick Dash: TBA at next session  UPPER EXTREMITY ROM     Active ROM Right eval Left eval  Shoulder flexion  107  Shoulder abduction    Shoulder adduction    Shoulder extension    Shoulder internal rotation  Able to complete with increased time/effort  Shoulder external rotation  Able to reach to touch just behind ear with increased time/effort  Elbow flexion  117  Elbow extension  -28  Wrist flexion  25%  Wrist extension  To neutral  Wrist ulnar deviation    Wrist radial deviation    Wrist pronation    Wrist  supination    (Blank rows = not tested)  Passive ROM Right eval Left eval  Shoulder flexion  113  Shoulder abduction    Shoulder adduction    Shoulder extension    Shoulder internal rotation    Shoulder external rotation    Elbow flexion  138  Elbow extension  -20  Wrist flexion  50%  Wrist extension  50%  Wrist ulnar deviation    Wrist radial deviation    Wrist pronation    Wrist supination    (Blank rows = not tested)   UPPER EXTREMITY MMT:     MMT Right eval Left eval  Shoulder flexion    Shoulder abduction    Shoulder adduction    Shoulder extension    Shoulder internal rotation    Shoulder external rotation    Middle trapezius    Lower trapezius    Elbow flexion    Elbow extension    Wrist flexion    Wrist extension  Wrist ulnar deviation    Wrist radial deviation    Wrist pronation    Wrist supination    (Blank rows = not tested)  HAND FUNCTION: Unable to assess grasp.  Pt with hypertonicity in MCP joints in flexion and extension in DIP and PIP  COORDINATION: Box and Blocks:  Right 49 blocks, Left 0blocks Pt attempted to stabilize blocks with other hand, but unable to abduct thumb enough to pick up any block  MUSCLE TONE: LUE: Severe and Hypertonic  COGNITION: Overall cognitive status: Within functional limits for tasks assessed and No family/caregiver present to determine baseline cognitive functioning  OBSERVATIONS: Pt restless throughout session, frequently getting up to wipe her nose and/or wash hands.  Also took off sweatshirt during eval due to overheating.   TODAY'S TREATMENT:  NA   PATIENT EDUCATION: Education details: Educated on role and purpose of OT as well as potential interventions and goals for therapy based on initial evaluation findings. Person educated: Patient Education method: Explanation Education comprehension: verbalized understanding and needs further education   HOME EXERCISE PROGRAM: TBD    GOALS: Goals  reviewed with patient? No  SHORT TERM GOALS: Target date: 12/06/21  Pt will be independent with ROM/stretching HEP. Baseline: Goal status: INITIAL  2.  Pt will demonstrate and/or verbalize ability to utilize LUE as stabilizer to gross assist to manage clothing fasteners (tying shoes and fastening bra). Baseline:  Goal status: INITIAL  3.  Pt will be able to grasp blocks to complete Box and Blocks with LUE with score of 4 or greater with divider for increased functional use of LUE. Baseline: unable to grasp blocks on eval Goal status: INITIAL   LONG TERM GOALS: Target date: 01/03/22  Pt will be independent with updated HEP. Baseline:  Goal status: INITIAL  2.  Pt will demonstrate increased active shoulder flexion to 125* with min compensations for preparing for reaching to comb hair and assistance styling, etc.  Baseline: AROM 107, PROM 113 Goal status: INITIAL  3.  Pt will perform Box and Blocks with score of 10 or greater with LUE in order to increase functional use and grasp/release with LUE.  Baseline: unable to grasp any blocks on eval Goal status: INITIAL  4.  Pt will demonstrate ability to style hair with adapted strateiges and/or equipment PRN.  Baseline:  Goal status: INITIAL  5.  Pt will demonstrate increased gross grasp/release with LUE to be able to release grasped items. Baseline: reports unable to let go of items Goal status: INITIAL   ASSESSMENT:  CLINICAL IMPRESSION: Patient is a 26 y.o. female who was seen today for occupational therapy evaluation for spasticity of dominant LUE. Pt currently lives with father in a single story home; father occasionally assists with self-care tasks as needed. 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 Pt  will benefit from skilled occupational therapy services to address strength and coordination, ROM, pain management, altered sensation, balance, GM/FM control, cognition, safety awareness, introduction of compensatory strategies/AE prn, and implementation of an HEP to improve participation and safety during ADLs and IADLs.   PERFORMANCE DEFICITS in functional skills including ADLs, IADLs, coordination, dexterity, tone, ROM, strength, flexibility, FMC, GMC, mobility, balance, body mechanics, skin integrity, and UE functional use, cognitive skills including attention and safety awareness, and psychosocial skills including environmental adaptation and habits.  IMPAIRMENTS are limiting patient from ADLs and IADLs.   COMORBIDITIES may have co-morbidities  that affects occupational performance. Patient will benefit from skilled OT to address above impairments and improve overall function.  MODIFICATION OR ASSISTANCE TO COMPLETE EVALUATION: Min-Moderate modification of tasks or assist with assess necessary to complete an evaluation.  OT OCCUPATIONAL PROFILE AND HISTORY: Detailed assessment: Review of records and additional review of physical, cognitive, psychosocial history related to current functional performance.  CLINICAL DECISION MAKING: Moderate - several treatment options, min-mod task modification necessary  REHAB POTENTIAL: Fair has had OT in the past with minimal progress  EVALUATION COMPLEXITY: Moderate    PLAN: OT FREQUENCY: 1-2x/week  OT DURATION: 8 weeks  PLANNED INTERVENTIONS: self care/ADL training, therapeutic exercise, therapeutic activity, neuromuscular re-education, manual therapy, passive range of motion, functional mobility training, splinting, electrical stimulation, ultrasound, compression bandaging, moist heat, cryotherapy, patient/family education, psychosocial skills training, coping strategies training, and DME and/or AE instructions  RECOMMENDED OTHER SERVICES: PT    CONSULTED AND AGREED WITH PLAN OF CARE: Patient  PLAN FOR NEXT SESSION: Initiate PROM for elbow and hand, Initiate HEP, Complete Quick Dash or other UE assessment   Tamieka Rancourt, OTR/L 11/06/2021, 4:56 PM

## 2021-11-08 ENCOUNTER — Ambulatory Visit: Payer: Self-pay | Admitting: Physical Medicine and Rehabilitation

## 2021-11-13 ENCOUNTER — Ambulatory Visit: Payer: Medicare HMO | Attending: Physical Medicine and Rehabilitation | Admitting: Occupational Therapy

## 2021-11-13 DIAGNOSIS — I69352 Hemiplegia and hemiparesis following cerebral infarction affecting left dominant side: Secondary | ICD-10-CM | POA: Insufficient documentation

## 2021-11-13 DIAGNOSIS — R2689 Other abnormalities of gait and mobility: Secondary | ICD-10-CM | POA: Insufficient documentation

## 2021-11-13 DIAGNOSIS — R278 Other lack of coordination: Secondary | ICD-10-CM | POA: Insufficient documentation

## 2021-11-13 DIAGNOSIS — M6281 Muscle weakness (generalized): Secondary | ICD-10-CM | POA: Insufficient documentation

## 2021-11-13 DIAGNOSIS — R41840 Attention and concentration deficit: Secondary | ICD-10-CM | POA: Insufficient documentation

## 2021-11-13 DIAGNOSIS — R2681 Unsteadiness on feet: Secondary | ICD-10-CM | POA: Insufficient documentation

## 2021-11-18 ENCOUNTER — Other Ambulatory Visit: Payer: Self-pay | Admitting: Physical Medicine and Rehabilitation

## 2021-11-18 ENCOUNTER — Telehealth: Payer: Self-pay

## 2021-11-18 DIAGNOSIS — R4189 Other symptoms and signs involving cognitive functions and awareness: Secondary | ICD-10-CM

## 2021-11-18 NOTE — Telephone Encounter (Signed)
Patient request for a MRI. Because in the past months she has had episodes of dizziness and  cognitive decline. Sometimes she does not know where she is at.   Patient has been advised to call her PCP and Dr. Durward Fortes will get this message ASAP.   Call back phone [phone]. -

## 2021-11-19 ENCOUNTER — Ambulatory Visit: Payer: Medicare HMO | Admitting: Occupational Therapy

## 2021-11-19 DIAGNOSIS — R278 Other lack of coordination: Secondary | ICD-10-CM | POA: Diagnosis not present

## 2021-11-19 DIAGNOSIS — R41840 Attention and concentration deficit: Secondary | ICD-10-CM | POA: Diagnosis not present

## 2021-11-19 DIAGNOSIS — I69352 Hemiplegia and hemiparesis following cerebral infarction affecting left dominant side: Secondary | ICD-10-CM

## 2021-11-19 DIAGNOSIS — M6281 Muscle weakness (generalized): Secondary | ICD-10-CM

## 2021-11-19 DIAGNOSIS — R2681 Unsteadiness on feet: Secondary | ICD-10-CM | POA: Diagnosis not present

## 2021-11-19 DIAGNOSIS — R2689 Other abnormalities of gait and mobility: Secondary | ICD-10-CM | POA: Diagnosis not present

## 2021-11-19 NOTE — Therapy (Signed)
OUTPATIENT OCCUPATIONAL THERAPY NEURO Treatment Note  Patient Name: Jane Watkins MRN: [id number] DOB:Dec 16, 1995, 26 y.o., female Today's Date: 11/19/2021  PCP: Gweneth Dimitri R,MD REFERRING PROVIDER: Horton Chin, MD    OT End of Session - 11/19/21 1418     Visit Number 2    Number of Visits 13    Date for OT Re-Evaluation 01/03/22    Authorization Type [employer]    OT Start Time 1408    OT Stop Time 1446    OT Time Calculation (min) 38 min              Past Medical History:  Diagnosis Date   AKI (acute kidney injury) (HCC) 2018   "from overdose"   Anxiety    Chlamydia    Daily headache    Depression    Drug overdose 04/2016   [name] 05/01/2016   GERD (gastroesophageal reflux disease)    "when I was younger; gone now" (09/21/2017)   IV drug abuse (HCC) 09/20/2017   Migraine    "q couple weeks" (09/21/2017)   Opioid abuse (HCC)    Overdose    Stroke [facility])    Past Surgical History:  Procedure Laterality Date   APPENDECTOMY  04/2013   I & D EXTREMITY Left 09/20/2017   Procedure: IRRIGATION AND DEBRIDEMENT LEFT ARM;  Surgeon: Bradly Bienenstock, MD;  Location: [facility] OR;  Service: Orthopedics;  Laterality: Left;   TEE WITHOUT CARDIOVERSION N/A 02/10/2019   Procedure: TRANSESOPHAGEAL ECHOCARDIOGRAM (TEE);  Surgeon: Debbe Odea, MD;  Location: [facility] ORS;  Service: Cardiovascular;  Laterality: N/A;  Polysubstance abuser   TONGUE SURGERY  ~ 2007   "related to lisp"   TRACHEOSTOMY  04/2016   [name] 05/01/2016   Patient Active Problem List   Diagnosis Date Noted   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   Postcoital UTI 05/24/2019   Spastic hemiparesis (HCC)    Vascular headache    Mood disorder in conditions classified elsewhere    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   Opioid use disorder, mild, in sustained remission (HCC)    Dyslipidemia    Ischemic stroke diagnosed during current admission (HCC) 02/13/2019   MDD (major depressive  disorder), recurrent, in full remission (HCC) 02/10/2019   CVA (cerebral vascular accident) (HCC) 02/07/2019   Polysubstance abuse (HCC) 09/20/2017   TBI (traumatic brain injury) (HCC) 05/01/2016   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     ONSET DATE: referral date 10/11/21  REFERRING DIAG: G81.10 (ICD-10-CM) - Spastic hemiparesis   THERAPY DIAG:  Spastic hemiplegia of left dominant side as late effect of cerebral infarction (HCC)  Muscle weakness (generalized)  Other lack of coordination  Attention and concentration deficit  Unsteadiness on feet  Rationale for Evaluation and Treatment Rehabilitation  SUBJECTIVE:   SUBJECTIVE STATEMENT: Pt reports that she recently received Botox injection in her L hand and is pursuing OT as she would like more function out of her hand, specifically individual movements of fingers.  Pt reports difficulty with releasing grasp on items as her hand is very tight.  She would like to be able to utilize LUE to aid in putting her hair up in pony tail. 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     PRECAUTIONS: Fall  WEIGHT  BEARING RESTRICTIONS No  PAIN:  Are you having pain? No  FALLS: Has patient fallen in last 6 months? Yes. Number of falls 10  Pt reports falling on uneven surfaces, falling on stairs.  She reports that she sometimes gets dizzy and tends to fall over.  LIVING ENVIRONMENT: Lives with:  father and his girlfriend Lives in: House/apartment Stairs: Yes: External: 5 steps; on right going up Has following equipment at home: Environmental consultant - 2 wheeled, Crutches, shower chair, Shower bench, and Grab bars  PLOF: Independent  PATIENT GOALS To use Left  hand more functionally, especially to pull back hair  OBJECTIVE:   HAND DOMINANCE: Left  ADLs: Transfers/ambulation related to ADLs:Mod I without use of AD Eating: uses rocker knife to cut foods, unable to use L hand to stabilize bowl/plate during eating Grooming: completes all tasks with R hand only, unable to pull hair back in pony tail UB Dressing: occasional assist with donning bra and pulling off shirt LB Dressing: reports pants can be difficult, unable to tie shoes Toileting: increased time and difficulty with pulling up pants, unable to button pants Bathing: Mod I Tub Shower transfers: Mod I, does use shower chair sometimes  Equipment: Shower seat with back, Transfer tub bench, Grab bars, and Long handled shoe horn   IADLs: Shopping: online order and then pick it up Light housekeeping: Mod I with laundry, dishes with increased time and effort, assist to make the bed Meal Prep: Making sandwich, pouring bowl of cereal and use of microwave, does not usually use stove or oven Community mobility: driving Medication management: Mod I Financial management: Mod I  MOBILITY STATUS: Hx of falls  POSTURE COMMENTS:  No Significant postural limitations Sitting balance: Moves/returns truncal midpoint 1-2 inches in multiple planes  ACTIVITY TOLERANCE: Activity tolerance: WFL for tasks assessed during abbreviated eval; restless throughout session  FUNCTIONAL OUTCOME MEASURES: Quick Dash: TBA at next session  UPPER EXTREMITY ROM     Active ROM Right eval Left eval  Shoulder flexion  107  Shoulder abduction    Shoulder adduction    Shoulder extension    Shoulder internal rotation  Able to complete with increased time/effort  Shoulder external rotation  Able to reach to touch just behind ear with increased time/effort  Elbow flexion  117  Elbow extension  -28  Wrist flexion  25%  Wrist extension  To neutral  Wrist ulnar deviation    Wrist radial deviation    Wrist pronation     Wrist supination    (Blank rows = not tested)  Passive ROM Right eval Left eval  Shoulder flexion  113  Shoulder abduction    Shoulder adduction    Shoulder extension    Shoulder internal rotation    Shoulder external rotation    Elbow flexion  138  Elbow extension  -20  Wrist flexion  50%  Wrist extension  50%  Wrist ulnar deviation    Wrist radial deviation    Wrist pronation    Wrist supination    (Blank rows = not tested)  HAND FUNCTION: Unable to assess grasp.  Pt with hypertonicity in MCP joints in flexion and extension in DIP and PIP  COORDINATION: Box and Blocks:  Right 49 blocks, Left 0blocks Pt attempted to stabilize blocks with other hand, but unable to abduct thumb enough to pick up any block  MUSCLE TONE: LUE: Severe and Hypertonic  COGNITION: Overall cognitive status: Within functional limits for tasks assessed and No family/caregiver present to  determine baseline cognitive functioning  OBSERVATIONS: Pt restless throughout session, frequently getting up to wipe her nose and/or wash hands.  Also took off sweatshirt during eval due to overheating.  ------------------------------------------------------------------------------------------------------------------------------------------------------------------------------------ (objective measures above completed at initial evaluation unless otherwise dated)  TODAY'S TREATMENT:  AAROM: Closed chain AAROM to facilitate increased shoulder flexion, elbow extension/flexion first with use of therapy ball to allow for open hand as well.  Transitioned to use of SPC as pt does not have a ball that she can use at home.  Reiterated completion of exercises with hand over hand to facilitate increased ROM and use of SPC for shoulder and elbow ROM. Towel slides: OT educated on towel slides with shoulder flexion, shoulder abduction, elbow flexion/extension while allowing for increased open hand.  Pt requiring facilitation for  increased open hand and then hand over hand to maintain position. Educated on AROM and placement of caregiver to aid in PROM as needed to facilitate increased stretch both in supine and sitting for shoulder abduction, shoulder flexion, elbow extension.     PATIENT EDUCATION: Education details: Shoulder and elbow flexion/extension HEP and use of hand over hand and/or caregiver assist for further stretch Person educated: Patient Education method: Explanation, Demonstration, Tactile cues, Verbal cues, and Handouts Education comprehension: verbalized understanding and needs further education   HOME EXERCISE PROGRAM: Access Code: [email or web address] URL: [email or web address] Date: 11/19/2021 Prepared by: [facility] - [facility]  Program Notes Even though these exercises show use of only one arm, feel free to place Right hand over Left to increase ROM/stretch  Exercises - Seated Shoulder Flexion Extension AAROM with Dowel into Wall  - 2 sets - 10 reps - Seated Shoulder Circles AAROM with Dowel into Wall  - 2 sets - 10 reps - Seated Shoulder Flexion Towel Slide at Table Top  - 2 sets - 10 reps - Seated Shoulder Abduction Towel Slide at Table Top  - 2 sets - 10 reps - Seated Elbow Extension and Shoulder External Rotation AAROM at Table with Towel  - 2 sets - 10 reps - Supine or Seated Shoulder Abduction AROM/PROM  - 2 sets - 10 reps    GOALS: Goals reviewed with patient? Yes  SHORT TERM GOALS: Target date: 12/06/21  Pt will be independent with ROM/stretching HEP. Baseline: Goal status: IN PROGRESS  2.  Pt will demonstrate and/or verbalize ability to utilize LUE as stabilizer to gross assist to manage clothing fasteners (tying shoes and fastening bra). Baseline:  Goal status: IN PROGRESS  3.  Pt will be able to grasp blocks to complete Box and Blocks with LUE with score of 4 or greater with divider for increased functional use of LUE. Baseline: unable  to grasp blocks on eval Goal status: IN PROGRESS   LONG TERM GOALS: Target date: 01/03/22  Pt will be independent with updated HEP. Baseline:  Goal status: IN PROGRESS  2.  Pt will demonstrate increased active shoulder flexion to 125* with min compensations for preparing for reaching to comb hair and assistance styling, etc.  Baseline: AROM 107, PROM 113 Goal status: IN PROGRESS  3.  Pt will perform Box and Blocks with score of 10 or greater with LUE in order to increase functional use and grasp/release with LUE.  Baseline: unable to grasp any blocks on eval Goal status: IN PROGRESS  4.  Pt will demonstrate ability to style hair with adapted strateiges and/or equipment PRN.  Baseline:  Goal status: IN PROGRESS  5.  Pt  will demonstrate increased gross grasp/release with LUE to be able to release grasped items. Baseline: reports unable to let go of items Goal status: IN PROGRESS   ASSESSMENT:  CLINICAL IMPRESSION: Pt present for first treatment session s/p initial evaluation. OT educated pt on POC and established goals, with pt most concerned with ROM of shoulder, elbow, and hand function.  Pt did not show for last visit and was a few mins late for this session, therefore focus placed on HEP to facilitate carryover at home.  PERFORMANCE DEFICITS in functional skills including ADLs, IADLs, coordination, dexterity, tone, ROM, strength, flexibility, FMC, GMC, mobility, balance, body mechanics, skin integrity, and UE functional use, cognitive skills including attention and safety awareness, and psychosocial skills including environmental adaptation and habits.   IMPAIRMENTS are limiting patient from ADLs and IADLs.   COMORBIDITIES may have co-morbidities  that affects occupational performance. Patient will benefit from skilled OT to address above impairments and improve overall function.  MODIFICATION OR ASSISTANCE TO COMPLETE EVALUATION: Min-Moderate modification of tasks or assist  with assess necessary to complete an evaluation.  OT OCCUPATIONAL PROFILE AND HISTORY: Detailed assessment: Review of records and additional review of physical, cognitive, psychosocial history related to current functional performance.  CLINICAL DECISION MAKING: Moderate - several treatment options, min-mod task modification necessary  REHAB POTENTIAL: Fair has had OT in the past with minimal progress  EVALUATION COMPLEXITY: Moderate    PLAN: OT FREQUENCY: 1-2x/week  OT DURATION: 8 weeks  PLANNED INTERVENTIONS: self care/ADL training, therapeutic exercise, therapeutic activity, neuromuscular re-education, manual therapy, passive range of motion, functional mobility training, splinting, electrical stimulation, ultrasound, compression bandaging, moist heat, cryotherapy, patient/family education, psychosocial skills training, coping strategies training, and DME and/or AE instructions  RECOMMENDED OTHER SERVICES: PT   CONSULTED AND AGREED WITH PLAN OF CARE: Patient  PLAN FOR NEXT SESSION: Initiate PROM for elbow and hand, Review HEP, Complete Quick Dash or other UE assessment   Cecily Lawhorne, OTR/L 11/19/2021, 2:48 PM

## 2021-11-21 ENCOUNTER — Ambulatory Visit: Payer: Medicare HMO | Admitting: Family Medicine

## 2021-11-22 DIAGNOSIS — F1120 Opioid dependence, uncomplicated: Secondary | ICD-10-CM | POA: Diagnosis not present

## 2021-11-22 DIAGNOSIS — F1420 Cocaine dependence, uncomplicated: Secondary | ICD-10-CM | POA: Diagnosis not present

## 2021-11-22 DIAGNOSIS — R69 Illness, unspecified: Secondary | ICD-10-CM | POA: Diagnosis not present

## 2021-11-25 DIAGNOSIS — F1420 Cocaine dependence, uncomplicated: Secondary | ICD-10-CM | POA: Diagnosis not present

## 2021-11-25 DIAGNOSIS — F1120 Opioid dependence, uncomplicated: Secondary | ICD-10-CM | POA: Diagnosis not present

## 2021-11-25 DIAGNOSIS — R69 Illness, unspecified: Secondary | ICD-10-CM | POA: Diagnosis not present

## 2021-11-25 NOTE — Therapy (Unsigned)
OUTPATIENT PHYSICAL THERAPY NEURO EVALUATION   Patient Name: Jane Watkins MRN: [id number] DOB:[birth year], 26 y.o., female Today's Date: 11/26/2021   PCP: Gweneth Dimitri, MD REFERRING PROVIDER: Horton Chin, MD   PT End of Session - 11/26/21 1410     Visit Number 1    Number of Visits 17    Date for PT Re-Evaluation 01/25/22    Authorization Type aetna medicare    PT Start Time 1330    PT Stop Time 1410    PT Time Calculation (min) 40 min    Equipment Utilized During Treatment Gait belt    Activity Tolerance Patient tolerated treatment well    Behavior During Therapy [facility] for tasks assessed/performed             Past Medical History:  Diagnosis Date   AKI (acute kidney injury) (HCC) 2018   "from overdose"   Anxiety    Chlamydia    Daily headache    Depression    Drug overdose 04/2016   [name] 05/01/2016   GERD (gastroesophageal reflux disease)    "when I was younger; gone now" (09/21/2017)   IV drug abuse (HCC) 09/20/2017   Migraine    "q couple weeks" (09/21/2017)   Opioid abuse (HCC)    Overdose    Stroke [facility])    Past Surgical History:  Procedure Laterality Date   APPENDECTOMY  04/2013   I & D EXTREMITY Left 09/20/2017   Procedure: IRRIGATION AND DEBRIDEMENT LEFT ARM;  Surgeon: Bradly Bienenstock, MD;  Location: [facility] OR;  Service: Orthopedics;  Laterality: Left;   TEE WITHOUT CARDIOVERSION N/A 02/10/2019   Procedure: TRANSESOPHAGEAL ECHOCARDIOGRAM (TEE);  Surgeon: Debbe Odea, MD;  Location: [facility] ORS;  Service: Cardiovascular;  Laterality: N/A;  Polysubstance abuser   TONGUE SURGERY  ~ 2007   "related to lisp"   TRACHEOSTOMY  04/2016   [name] 05/01/2016   Patient Active Problem List   Diagnosis Date Noted   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   Postcoital UTI 05/24/2019   Spastic hemiparesis (HCC)    Vascular headache    Mood disorder in conditions classified elsewhere    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   Opioid use disorder,  mild, in sustained remission (HCC)    Dyslipidemia    Ischemic stroke diagnosed during current admission (HCC) 02/13/2019   MDD (major depressive disorder), recurrent, in full remission (HCC) 02/10/2019   CVA (cerebral vascular accident) (HCC) 02/07/2019   Polysubstance abuse (HCC) 09/20/2017   TBI (traumatic brain injury) (HCC) 05/01/2016   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  She has continued L UE/LE weakness.  She was L handed, but is working on R handed tasks for everything.  She reports occasional dizziness and L foot dragging. Pt accompanied by: self and friend Ryan  PERTINENT HISTORY: PMH: R MCA CVA (01/2019) , IV drug abuse, anxiety, depression, migraines, AKI, hypoxic ischemic encephalopathy 2018, opiate overdose 2018, h/o trach 2018   PAIN:  Are you having pain? Yes: NPRS scale: 4/10 Pain location: L lateral foot Pain description: aching and stabbing at times Aggravating factors: walking, weight bearing Relieving factors: nothing  PRECAUTIONS: Fall  WEIGHT BEARING RESTRICTIONS No  FALLS: Has patient fallen in last 6 months? Yes. Number of falls 10  Pt reports falling on uneven surfaces, falling on stairs.  She reports that she sometimes gets dizzy and tends to fall over.   LIVING ENVIRONMENT: Lives with:  father and his girlfriend Lives  in: House/apartment Stairs: Yes: External: 5 steps; on right going up Has following equipment at home: Gilford Rile - 2 wheeled, Crutches, shower chair, Shower bench, and Grab bars   PLOF: Independent  PATIENT GOALS Get more comfortable with walking position.  Get better L ankle positioning.  OBJECTIVE:   COGNITION: Overall cognitive status:  memory-- she reports she is getting an MRI   SENSATION: Light touch: Impaired  Dec'd sensation on the L thigh  COORDINATION: Slower speed due to motor impairments  MUSCLE TONE: LLE: increased muscle tone with ankle inversion  POSTURE:  maintains R weight shift  and L lateral border of foot in contact with the floor  LOWER EXTREMITY ROM:     Active  Right Eval Left Eval  Hip flexion    Hip extension    Hip abduction    Hip adduction    Hip internal rotation    Hip external rotation    Knee flexion    Knee extension    Ankle dorsiflexion  90 deg in WB position loading  Ankle plantarflexion    Ankle inversion    Ankle eversion     (Blank rows = not tested)  LOWER EXTREMITY MMT:    MMT Right Eval Left Eval  Hip flexion 5/5 4/5  Hip extension    Hip abduction    Hip adduction    Hip internal rotation    Hip external rotation    Knee flexion 5/5 4/5  Knee extension 5/5 4+/5  Ankle dorsiflexion 5/5 1/5  Ankle plantarflexion 5/5 1/5  Ankle inversion  2/5  Ankle eversion  0/5  (Blank rows = not tested)  BED MOBILITY:  independent  TRANSFERS: Assistive device utilized: None  Sit to stand: Modified independence Stand to sit: Modified independence STAIRS:  Level of Assistance: Modified independence  Stair Negotiation Technique: Alternating Pattern  with Single Rail on Right  Number of Stairs: 4   Height of Stairs: 4 and 6"  Comments: can ascend steps reciprocal pattern and chooses step to pattern descending with stronger leg.  When she attempts to descend with the L foot, she has increased muscle tone L LE  GAIT: Gait  pattern:  L toe WB with ankle inversion, dec'd weight shifting to the left Distance walked: 100 Assistive device utilized: None Level of assistance: Modified independence Comments: slowed pace, does not get L foot in contact with the floor  FUNCTIONAL TESTs:  10 meter walk test: 12.97 seconds  =0.77 m/s 5 time sit to stand= 7.26 seconds BERG BALANCE=55/56  PATIENT  SURVEYS:  N/a  TODAY'S TREATMENT:  11/26/21: See HEP  PATIENT EDUCATION: Education details: plan of care Person educated: Patient Education method: Explanation, Demonstration, and Handouts Education comprehension: verbalized understanding and returned demonstration  HOME EXERCISE PROGRAM: Access Code: [email or web address] URL: [email or web address] Date: 11/26/2021 Prepared by: Margretta Ditty  Exercises - Gastroc Stretch on Wall  - 2 x daily - 7 x weekly - 1 sets - 3 reps - 20 seconds hold - Seated Toe Flexion Extension PROM  - 2 x daily - 7 x weekly - 1 sets - 3 reps - 20 seconds hold - Single Leg Stance with Support  - 2 x daily - 7 x weekly - 1 sets - 3 reps - 30 seconds hold  GOALS: Goals reviewed with patient? Yes  SHORT TERM GOALS: Target date: 12/24/2021  The patient will be indep with HEP. Baseline: no current home program Goal status: INITIAL  2.  The patient will improve gait mechanics demonstrating left foot flat during initial contact. Baseline: Only hits L foot lateral border near 5th digit on floor during initial contact. Goal status: INITIAL  3.  The patient will negotiate curb and steps mod indep without a device. Baseline: Uses one rail.  Goal status: INITIAL  LONG TERM GOALS: Target date: 01/21/2022  The patient will be indep with HEP progression. Baseline: no HEP Goal status: INITIAL  2.  The patient will improve gait speed to 1 m/s Baseline: 0.77 m/s Goal status: INITIAL  3.  The patient will report pain in L foot < or equal to 2/10 Baseline: 4/10 Goal status:  INITIAL  4.  The patient will move floor<>stand mod indep due to h/o falls. Baseline: reports 10 falls in last 6 months. Goal status: INITIAL  5.  The patient will negotiate unlevel surfaces mod indep. Baseline: reports some falls on unlevel surfaces. Goal status: INITIAL  ASSESSMENT:  CLINICAL IMPRESSION: Patient is a 26 y.o. female who was seen today for physical therapy evaluation and treatment for L spastic hemiparesis following a CVA in 01/2019.  She presents with dec'd distal strength with minimal isolated motor control of the L ankle and foot.  The patient has compensated to shift her weight to the right.  With cues she is able to place the L foot flat prior to loading into mid stance.   OBJECTIVE IMPAIRMENTS Abnormal gait, difficulty walking, decreased ROM, decreased strength, dizziness, hypomobility, increased fascial restrictions, impaired flexibility, impaired tone, and pain.   ACTIVITY LIMITATIONS squatting and locomotion level  PARTICIPATION LIMITATIONS: cleaning and community activity  PERSONAL FACTORS 1-2 comorbidities: h/o migraines and CVA  are also affecting patient's functional outcome.   REHAB POTENTIAL: Good  CLINICAL DECISION MAKING: Stable/uncomplicated  EVALUATION COMPLEXITY: Low  PLAN: PT FREQUENCY: 2x/week  PT DURATION: 8 weeks + eval  PLANNED INTERVENTIONS: Therapeutic exercises, Therapeutic activity, Neuromuscular re-education, Balance training, Gait training, Patient/Family education, Self Care, Joint mobilization, Orthotic/Fit training, DME instructions, Aquatic Therapy, Dry Needling, Electrical stimulation, Moist heat, Taping, Manual therapy, and (precaution with e-stim-- she reports h/o seizures during drug use)  PLAN FOR NEXT SESSION: Joint mobs L ankle for ROM, focus on isolated motor control that is available in foot, ankle and toes, progress HEP, L standing weight shift, gait training, strengthening L ankle/foot.   Terianne Thaker,  PT 11/26/2021, 2:19 PM

## 2021-11-26 ENCOUNTER — Ambulatory Visit: Payer: Medicare HMO | Admitting: Occupational Therapy

## 2021-11-26 ENCOUNTER — Other Ambulatory Visit: Payer: Self-pay

## 2021-11-26 ENCOUNTER — Ambulatory Visit: Payer: Medicare HMO | Admitting: Rehabilitative and Restorative Service Providers"

## 2021-11-26 DIAGNOSIS — R278 Other lack of coordination: Secondary | ICD-10-CM

## 2021-11-26 DIAGNOSIS — I69352 Hemiplegia and hemiparesis following cerebral infarction affecting left dominant side: Secondary | ICD-10-CM | POA: Diagnosis not present

## 2021-11-26 DIAGNOSIS — R2689 Other abnormalities of gait and mobility: Secondary | ICD-10-CM | POA: Diagnosis not present

## 2021-11-26 DIAGNOSIS — R41840 Attention and concentration deficit: Secondary | ICD-10-CM | POA: Diagnosis not present

## 2021-11-26 DIAGNOSIS — M6281 Muscle weakness (generalized): Secondary | ICD-10-CM

## 2021-11-26 DIAGNOSIS — R2681 Unsteadiness on feet: Secondary | ICD-10-CM

## 2021-11-26 DIAGNOSIS — R69 Illness, unspecified: Secondary | ICD-10-CM | POA: Diagnosis not present

## 2021-11-26 DIAGNOSIS — F1420 Cocaine dependence, uncomplicated: Secondary | ICD-10-CM | POA: Diagnosis not present

## 2021-11-26 DIAGNOSIS — F1120 Opioid dependence, uncomplicated: Secondary | ICD-10-CM | POA: Diagnosis not present

## 2021-11-26 NOTE — Therapy (Addendum)
OUTPATIENT OCCUPATIONAL THERAPY NEURO Treatment Note  Patient Name: Jane Watkins MRN: [id number] DOB:Aug 03, 1995, 26 y.o., female Today's Date: 11/26/2021  PCP: Waunita Schooner R,MD REFERRING PROVIDER: Izora Ribas, MD    OT End of Session - 11/26/21 1406     Visit Number 3    Number of Visits 13    Date for OT Re-Evaluation 01/03/22    Authorization Type [employer]    OT Start Time 1406    OT Stop Time 1444    OT Time Calculation (min) 38 min              Past Medical History:  Diagnosis Date   AKI (acute kidney injury) ([place]) 2018   "from overdose"   Anxiety    Chlamydia    Daily headache    Depression    Drug overdose 04/2016   [name] 05/01/2016   GERD (gastroesophageal reflux disease)    "when I was younger; gone now" (09/21/2017)   IV drug abuse ([place]) 09/20/2017   Migraine    "q couple weeks" (09/21/2017)   Opioid abuse ([place])    Overdose    Stroke [facility])    Past Surgical History:  Procedure Laterality Date   APPENDECTOMY  04/2013   I & D EXTREMITY Left 09/20/2017   Procedure: IRRIGATION AND DEBRIDEMENT LEFT ARM;  Surgeon: [name], MD;  Location: [place];  Service: Orthopedics;  Laterality: Left;   TEE WITHOUT CARDIOVERSION N/A 02/10/2019   Procedure: TRANSESOPHAGEAL ECHOCARDIOGRAM (TEE);  Surgeon: [name], MD;  Location: [facility];  Service: Cardiovascular;  Laterality: N/A;  Polysubstance abuser   TONGUE SURGERY  ~ 2007   "related to lisp"   TRACHEOSTOMY  04/2016   [name] 05/01/2016   Patient Active Problem List   Diagnosis Date Noted   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   Postcoital UTI 05/24/2019   Spastic hemiparesis (HCC)    Vascular headache    Mood disorder in conditions classified elsewhere    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   Opioid use disorder, mild, in sustained remission ([place])    Dyslipidemia    Ischemic stroke diagnosed during current admission ([place]) 02/13/2019   MDD (major depressive  disorder), recurrent, in full remission ([place]) 02/10/2019   CVA (cerebral vascular accident) ([place]) 02/07/2019   Polysubstance abuse ([place]) 09/20/2017   TBI (traumatic brain injury) ([place]) 05/01/2016   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 Pt accompanied by: self (and friend, Thurmond Butts)  PERTINENT HISTORY: PMHx pertinent for right MCA stroke 01/2019 with residual left spastic hemiparesis, hx of IV drug abuse with hospitalization 04/2016 with narcotic overdose and hypoxic ischemic encephalopathy with resultant cognitive impairment, anxiety with PTSD, and migraine headaches.     PRECAUTIONS: Fall  WEIGHT BEARING RESTRICTIONS No  PAIN:  Are you having pain? No  FALLS: Has patient fallen in last 6 months? Yes. Number of falls 10  Pt reports falling on uneven surfaces, falling on stairs.  She reports that she sometimes gets dizzy and tends to fall over.  LIVING ENVIRONMENT: Lives with:  father and his girlfriend Lives  in: House/apartment Stairs: Yes: External: 5 steps; on right going up Has following equipment at home: Walker - 2 wheeled, Crows Nest, shower chair, Shower bench, and Grab bars  PLOF: Independent  PATIENT GOALS To use Left hand more functionally, especially to pull back hair  OBJECTIVE:   HAND DOMINANCE: Left  ADLs: Transfers/ambulation related to ADLs:Mod I without use of AD Eating: uses rocker knife to cut foods, unable to use L hand to stabilize bowl/plate during eating Grooming: completes all tasks with  R hand only, unable to pull hair back in pony tail UB Dressing: occasional assist with donning bra and pulling off shirt LB Dressing: reports pants can be difficult, unable to tie shoes Toileting: increased time and difficulty with pulling up pants, unable to button pants Bathing: Mod I Tub Shower transfers: Mod I, does use shower chair sometimes  Equipment: Shower seat with back, Transfer tub bench, Grab bars, and Long handled shoe horn   IADLs: Shopping: online order and then pick it up Light housekeeping: Mod I with laundry, dishes with increased time and effort, assist to make the bed Meal Prep: Making sandwich, pouring bowl of cereal and use of microwave, does not usually use stove or oven Community mobility: driving Medication management: Mod I Financial management: Mod I  MOBILITY STATUS: Hx of falls  POSTURE COMMENTS:  No Significant postural limitations Sitting balance: Moves/returns truncal midpoint 1-2 inches in multiple planes  ACTIVITY TOLERANCE: Activity tolerance: WFL for tasks assessed during abbreviated eval; restless throughout session  FUNCTIONAL OUTCOME MEASURES: Quick Dash: TBA at next session  UPPER EXTREMITY ROM     Active ROM Right eval Left eval  Shoulder flexion  107  Shoulder abduction    Shoulder adduction    Shoulder extension    Shoulder internal rotation  Able to complete with increased time/effort  Shoulder external rotation  Able to reach to touch just behind ear with increased time/effort  Elbow flexion  117  Elbow extension  -28  Wrist flexion  25%  Wrist extension  To neutral  Wrist ulnar deviation    Wrist radial deviation    Wrist pronation    Wrist supination    (Blank rows = not tested)  Passive ROM Right eval Left eval  Shoulder flexion  113  Shoulder abduction    Shoulder adduction    Shoulder extension    Shoulder internal rotation    Shoulder external rotation    Elbow flexion  138  Elbow extension  -20  Wrist  flexion  50%  Wrist extension  50%  Wrist ulnar deviation    Wrist radial deviation    Wrist pronation    Wrist supination    (Blank rows = not tested)  HAND FUNCTION: Unable to assess grasp.  Pt with hypertonicity in MCP joints in flexion and extension in DIP and PIP  COORDINATION: Box and Blocks:  Right 49 blocks, Left 0blocks Pt attempted to stabilize blocks with other hand, but unable to abduct thumb enough to pick up any block  MUSCLE TONE: LUE: Severe and Hypertonic  COGNITION: Overall cognitive status: Within functional limits for tasks assessed and No family/caregiver present to determine baseline cognitive functioning  OBSERVATIONS: Pt restless throughout session, frequently getting up to wipe her nose and/or wash hands.  Also took off sweatshirt during eval due to overheating.  ------------------------------------------------------------------------------------------------------------------------------------------------------------------------------------ (objective measures above completed at initial evaluation unless otherwise dated)  TODAY'S TREATMENT:  11/26/21: WB through hand: engaged in [place] with WB through L hand on  half ball and OT providing support at hand and elbow to further facilitate WB while pt reaching across body retrieving items while increasing WB through LUE.  PROM: OT providing intermittent PROM/stretching for further elbow extension and wrist flexion/extension.  Pt able to maintain elbow extension after sustained stretch from therapist, however unable to replicate after flexion.  Pt does report significant improvement in elbow extension s/p most recent Botox injection into biceps.   Supination/pronation with grasp: Attempted to grasp narrow cones, however pt continues to demonstrate decreased thumb abduction without manual positioning of thumb and for finger extension at MCP.  Pt able to grasp cones with gross grasp without thumb and maintain grasp while  supinating forearm and placing cone in upright position.  Massed practice with supination/pronation while maintaining cone in hand for visual feedback of mobility.   Elbow extension: engaged in elbow extension post PROM/stretching to facilitate increased extension. OT instructed pt in flexion/extension to place item at end of reach to further facilitate increased extension.  Provided cues to maintain grasp on item for increased functional carryover.     11/19/21 AAROM: Closed chain AAROM to facilitate increased shoulder flexion, elbow extension/flexion first with use of therapy ball to allow for open hand as well.  Transitioned to use of SPC as pt does not have a ball that she can use at home.  Reiterated completion of exercises with hand over hand to facilitate increased ROM and use of SPC for shoulder and elbow ROM. Towel slides: OT educated on towel slides with shoulder flexion, shoulder abduction, elbow flexion/extension while allowing for increased open hand.  Pt requiring facilitation for increased open hand and then hand over hand to maintain position. Educated on AROM and placement of caregiver to aid in PROM as needed to facilitate increased stretch both in supine and sitting for shoulder abduction, shoulder flexion, elbow extension.     PATIENT EDUCATION: Education details: Shoulder and elbow flexion/extension HEP and use of hand over hand and/or caregiver assist for further stretch Person educated: Patient Education method: Explanation, Demonstration, Tactile cues, Verbal cues, and Handouts Education comprehension: verbalized understanding and needs further education   HOME EXERCISE PROGRAM: Access Code: [email or web address] URL: [email or web address] Date: 11/19/2021 Prepared by: [facility]  Program Notes Even though these exercises show use of only one arm, feel free to place Right hand over Left to increase ROM/stretch  Exercises -  Seated Shoulder Flexion Extension AAROM with [place] into Wall  - 2 sets - 10 reps - Seated Shoulder Circles AAROM with Dowel into Wall  - 2 sets - 10 reps - Seated Shoulder Flexion Towel Slide at Table Top  - 2 sets - 10 reps - Seated Shoulder Abduction Towel Slide at Table Top  - 2 sets - 10 reps - Seated Elbow Extension and Shoulder External Rotation AAROM at Table with Towel  - 2 sets - 10 reps - Supine or Seated Shoulder Abduction AROM/PROM  - 2 sets - 10 reps    GOALS: Goals reviewed with patient? Yes  SHORT TERM GOALS: Target date: 12/06/21  Pt will be independent with ROM/stretching HEP. Baseline: Goal status: IN PROGRESS  2.  Pt will demonstrate and/or verbalize ability to utilize LUE as stabilizer to gross assist to manage clothing fasteners (tying shoes and fastening bra). Baseline:  Goal status: IN PROGRESS  3.  Pt will be able to grasp blocks to complete Box and Blocks with LUE with score of 4 or greater with divider  for increased functional use of LUE. Baseline: unable to grasp blocks on eval Goal status: IN PROGRESS   LONG TERM GOALS: Target date: 01/03/22  Pt will be independent with updated HEP. Baseline:  Goal status: IN PROGRESS  2.  Pt will demonstrate increased active shoulder flexion to 125* with min compensations for preparing for reaching to comb hair and assistance styling, etc.  Baseline: AROM 107, PROM 113 Goal status: IN PROGRESS  3.  Pt will perform Box and Blocks with score of 10 or greater with LUE in order to increase functional use and grasp/release with LUE.  Baseline: unable to grasp any blocks on eval Goal status: IN PROGRESS  4.  Pt will demonstrate ability to style hair with adapted strateiges and/or equipment PRN.  Baseline:  Goal status: IN PROGRESS  5.  Pt will demonstrate increased gross grasp/release with LUE to be able to release grasped items. Baseline: reports unable to let go of items Goal status: IN  PROGRESS   ASSESSMENT:  CLINICAL IMPRESSION: Treatment session with focus on elbow extension, hand opening, and WB through LUE for improved functional use. Pt reports improvements in elbow extension s/p most recent Botox injection into biceps, however continues to demonstrate limited finger mobility as needed for grasp and release.  Pt with gross grasp and ability to maintain grasp on narrow cones after manual placement of thumb, but with decreased ability to release cones due to minimal thumb mobility.  PERFORMANCE DEFICITS in functional skills including ADLs, IADLs, coordination, dexterity, tone, ROM, strength, flexibility, FMC, GMC, mobility, balance, body mechanics, skin integrity, and UE functional use, cognitive skills including attention and safety awareness, and psychosocial skills including environmental adaptation and habits.   IMPAIRMENTS are limiting patient from ADLs and IADLs.   COMORBIDITIES may have co-morbidities  that affects occupational performance. Patient will benefit from skilled OT to address above impairments and improve overall function.  MODIFICATION OR ASSISTANCE TO COMPLETE EVALUATION: Min-Moderate modification of tasks or assist with assess necessary to complete an evaluation.  OT OCCUPATIONAL PROFILE AND HISTORY: Detailed assessment: Review of records and additional review of physical, cognitive, psychosocial history related to current functional performance.  CLINICAL DECISION MAKING: Moderate - several treatment options, min-mod task modification necessary  REHAB POTENTIAL: Fair has had OT in the past with minimal progress  EVALUATION COMPLEXITY: Moderate    PLAN: OT FREQUENCY: 1-2x/week  OT DURATION: 8 weeks  PLANNED INTERVENTIONS: self care/ADL training, therapeutic exercise, therapeutic activity, neuromuscular re-education, manual therapy, passive range of motion, functional mobility training, splinting, electrical stimulation, ultrasound,  compression bandaging, moist heat, cryotherapy, patient/family education, psychosocial skills training, coping strategies training, and DME and/or AE instructions  RECOMMENDED OTHER SERVICES: PT   CONSULTED AND AGREED WITH PLAN OF CARE: Patient  PLAN FOR NEXT SESSION:  Review HEP, engage in functional reach and grasp, supination/pronation, WB   Minal Stuller, OTR/L 11/26/2021, 4:01 PM   OCCUPATIONAL THERAPY DISCHARGE SUMMARY  Visits from Start of Care: 3  Current functional level related to goals / functional outcomes: See above for last known status.  Pt has not returned to therapy since 12/06/21 and has since been hospitalized s/p overdose.  Pt will require new referral for OT when appropriate.   Remaining deficits: limited LUE finger mobility as needed for grasp and release, hypertonicity/spasticity impacting functional use of LUE   Education / Equipment: Shoulder and elbow ROM HEP   Patient agrees to discharge. Patient goals were not met. Patient is being discharged due to not returning since the  last visit.Marland Kitchen   Simonne Come, OTR/L 01/31/22

## 2021-11-27 DIAGNOSIS — R69 Illness, unspecified: Secondary | ICD-10-CM | POA: Diagnosis not present

## 2021-11-27 DIAGNOSIS — F1120 Opioid dependence, uncomplicated: Secondary | ICD-10-CM | POA: Diagnosis not present

## 2021-11-27 DIAGNOSIS — F1420 Cocaine dependence, uncomplicated: Secondary | ICD-10-CM | POA: Diagnosis not present

## 2021-11-29 DIAGNOSIS — R69 Illness, unspecified: Secondary | ICD-10-CM | POA: Diagnosis not present

## 2021-11-29 DIAGNOSIS — F1120 Opioid dependence, uncomplicated: Secondary | ICD-10-CM | POA: Diagnosis not present

## 2021-11-29 DIAGNOSIS — F1420 Cocaine dependence, uncomplicated: Secondary | ICD-10-CM | POA: Diagnosis not present

## 2021-12-02 DIAGNOSIS — R69 Illness, unspecified: Secondary | ICD-10-CM | POA: Diagnosis not present

## 2021-12-02 DIAGNOSIS — F1120 Opioid dependence, uncomplicated: Secondary | ICD-10-CM | POA: Diagnosis not present

## 2021-12-02 DIAGNOSIS — F1420 Cocaine dependence, uncomplicated: Secondary | ICD-10-CM | POA: Diagnosis not present

## 2021-12-02 NOTE — Therapy (Incomplete)
OUTPATIENT PHYSICAL THERAPY NEURO TREATMENT   Patient Name: Jane Watkins MRN: [id number] DOB:1995-10-31, 26 y.o., female Today's Date: 12/02/2021   PCP: Waunita Schooner, MD REFERRING PROVIDER: Izora Ribas, MD     Past Medical History:  Diagnosis Date   AKI (acute kidney injury) ([place]) 2018   "from overdose"   Anxiety    Chlamydia    Daily headache    Depression    Drug overdose 04/2016   [name] 05/01/2016   GERD (gastroesophageal reflux disease)    "when I was younger; gone now" (09/21/2017)   IV drug abuse ([place]) 09/20/2017   Migraine    "q couple weeks" (09/21/2017)   Opioid abuse ([place])    Overdose    Stroke [facility])    Past Surgical History:  Procedure Laterality Date   APPENDECTOMY  04/2013   I & D EXTREMITY Left 09/20/2017   Procedure: IRRIGATION AND DEBRIDEMENT LEFT ARM;  Surgeon: [name], MD;  Location: [place];  Service: Orthopedics;  Laterality: Left;   TEE WITHOUT CARDIOVERSION N/A 02/10/2019   Procedure: TRANSESOPHAGEAL ECHOCARDIOGRAM (TEE);  Surgeon: [name], MD;  Location: [facility] OR[facility];  Service: Cardiovascular;  Laterality: N/A;  Polysubstance abuser   TONGUE SURGERY  ~ 2007   "related to lisp"   TRACHEOSTOMY  04/2016   [name] 05/01/2016   Patient Active Problem List   Diagnosis Date Noted   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   Postcoital UTI 05/24/2019   Spastic hemiparesis (HCC)    Vascular headache    Mood disorder in conditions classified elsewhere    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  Rationale for Evaluation and Treatment Rehabilitation  SUBJECTIVE:                                                                                                                                                                                              SUBJECTIVE STATEMENT: The patient is s/p R MCA CVA in 01/2019.  She has continued L UE/LE weakness.  She was L handed, but is working on R handed tasks for everything.  She reports occasional dizziness and L foot dragging. Pt accompanied by: self and friend Ryan  PERTINENT HISTORY: PMH: R MCA CVA (01/2019) , IV drug abuse, anxiety, depression, migraines, AKI, hypoxic ischemic encephalopathy 2018, opiate overdose 2018, h/o trach 2018   PAIN:  Are you having pain? Yes: NPRS scale: 4/10 Pain location: L lateral foot Pain description: aching and stabbing at times Aggravating factors: walking, weight bearing Relieving factors: nothing  PRECAUTIONS: Fall  FALLS: Has patient fallen in last 6 months? Yes. Number of falls 10  Pt reports falling on uneven surfaces, falling on stairs.  She reports that she sometimes gets dizzy and tends to fall over.  PATIENT GOALS Get more comfortable with walking position.  Get better L ankle positioning.  OBJECTIVE:    TODAY'S TREATMENT: 12/03/21 Activity Comments                          Below measures were taken at time of initial evaluation unless otherwise specified:   COGNITION: Overall cognitive status:  memory-- she reports she is getting an MRI   SENSATION: Light touch: Impaired  Dec'd sensation on the L thigh  COORDINATION: Slower speed due to motor impairments  MUSCLE TONE: LLE: increased muscle tone with ankle inversion  POSTURE:  maintains R weight shift  and L lateral border of foot in contact with the floor  LOWER EXTREMITY ROM:      Active  Right Eval Left Eval  Hip flexion    Hip extension    Hip abduction    Hip adduction    Hip internal rotation    Hip external rotation    Knee flexion    Knee extension    Ankle dorsiflexion  90 deg in WB position loading  Ankle plantarflexion    Ankle inversion    Ankle eversion     (Blank rows = not tested)  LOWER EXTREMITY MMT:    MMT Right Eval Left Eval  Hip flexion 5/5 4/5  Hip extension    Hip abduction    Hip adduction    Hip internal rotation    Hip external rotation    Knee flexion 5/5 4/5  Knee extension 5/5 4+/5  Ankle dorsiflexion 5/5 1/5  Ankle plantarflexion 5/5 1/5  Ankle inversion  2/5  Ankle eversion  0/5  (Blank rows = not tested)  BED MOBILITY:  independent  TRANSFERS: Assistive device utilized: None  Sit to stand: Modified independence Stand to sit: Modified independence STAIRS:  Level of Assistance: Modified independence  Stair Negotiation Technique: Alternating Pattern  with Single Rail on Right  Number of Stairs: 4   Height of Stairs: 4 and 6"  Comments: can ascend steps reciprocal pattern and chooses step to pattern descending with stronger leg.  When she attempts to descend with the L foot, she has increased muscle tone L LE  GAIT: Gait pattern:  L toe WB with ankle inversion, dec'd weight shifting to the left Distance walked: 100 Assistive device utilized: None Level of assistance: Modified independence Comments: slowed pace, does not get L foot in contact with the floor  FUNCTIONAL TESTs:  10 meter walk test: 12.97 seconds  =0.77 m/s 5 time sit to stand= 7.26 seconds BERG BALANCE=55/56  PATIENT SURVEYS:  N/a  TODAY'S TREATMENT:  11/26/21: See HEP  PATIENT EDUCATION: Education details: plan of care Person educated: Patient Education method: Explanation, Demonstration, and Handouts Education comprehension: verbalized understanding and returned demonstration  HOME EXERCISE PROGRAM:  Access Code:  [email or web address] URL: [email or web address] Date: 11/26/2021 Prepared by: Rudell Cobb  Exercises - Gastroc Stretch on Wall  - 2 x daily - 7 x weekly - 1 sets - 3 reps - 20 seconds hold - Seated Toe Flexion Extension PROM  - 2 x daily - 7 x weekly - 1 sets - 3 reps - 20 seconds hold - Single Leg Stance with Support  - 2 x daily - 7 x weekly - 1 sets - 3 reps - 30 seconds hold  GOALS: Goals reviewed with patient? Yes  SHORT TERM GOALS: Target date: 12/24/2021  The patient will be indep with HEP. Baseline: no current home program Goal status: IN PROGRESS  2.  The patient will improve gait mechanics demonstrating left foot flat during initial contact. Baseline: Only hits L foot lateral border near 5th digit on floor during initial contact. Goal status: IN PROGRESS  3.  The patient will negotiate curb and steps mod indep without a device. Baseline: Uses one rail.  Goal status: IN PROGRESS  LONG TERM GOALS: Target date: 01/21/2022  The patient will be indep with HEP progression. Baseline: no HEP Goal status: IN PROGRESS  2.  The patient will improve gait speed to 1 m/s Baseline: 0.77 m/s Goal status: IN PROGRESS  3.  The patient will report pain in L foot < or equal to 2/10 Baseline: 4/10 Goal status: IN PROGRESS  4.  The patient will move floor<>stand mod indep due to h/o falls. Baseline: reports 10 falls in last 6 months. Goal status: IN PROGRESS  5.  The patient will negotiate unlevel surfaces mod indep. Baseline: reports some falls on unlevel surfaces. Goal status: IN PROGRESS  ASSESSMENT:  CLINICAL IMPRESSION: Patient is a 26 y.o. female who was seen today for physical therapy evaluation and treatment for L spastic hemiparesis following a CVA in 01/2019.  She presents with dec'd distal strength with minimal isolated motor control of the L ankle and foot.  The patient has compensated to shift her weight to the right.  With cues she is able to place the L  foot flat prior to loading into mid stance.   OBJECTIVE IMPAIRMENTS Abnormal gait, difficulty walking, decreased ROM, decreased strength, dizziness, hypomobility, increased fascial restrictions, impaired flexibility, impaired tone, and pain.   ACTIVITY LIMITATIONS squatting and locomotion level  PARTICIPATION LIMITATIONS: cleaning and community activity  PERSONAL FACTORS 1-2 comorbidities: h/o migraines and CVA  are also affecting patient's functional outcome.   REHAB POTENTIAL: Good  CLINICAL DECISION MAKING: Stable/uncomplicated  EVALUATION COMPLEXITY: Low  PLAN: PT FREQUENCY: 2x/week  PT DURATION: 8 weeks + eval  PLANNED INTERVENTIONS: Therapeutic exercises, Therapeutic activity, Neuromuscular re-education, Balance training, Gait training, Patient/Family education, Self Care, Joint mobilization, Orthotic/Fit training, DME instructions, Aquatic Therapy, Dry Needling, Electrical stimulation, Moist heat, Taping, Manual therapy, and (precaution with e-stim-- she reports h/o seizures during drug use)  PLAN FOR NEXT SESSION: Joint mobs L ankle for ROM, focus on isolated motor control that is available in foot, ankle and toes, progress HEP, L standing weight shift, gait training, strengthening L ankle/foot.   Manuela Neptune, PT 12/02/2021, 8:20 AM

## 2021-12-03 ENCOUNTER — Ambulatory Visit: Payer: Medicare HMO | Admitting: Occupational Therapy

## 2021-12-03 ENCOUNTER — Ambulatory Visit: Payer: Medicare HMO | Admitting: Physical Therapy

## 2021-12-03 DIAGNOSIS — F1420 Cocaine dependence, uncomplicated: Secondary | ICD-10-CM | POA: Diagnosis not present

## 2021-12-03 DIAGNOSIS — R69 Illness, unspecified: Secondary | ICD-10-CM | POA: Diagnosis not present

## 2021-12-03 DIAGNOSIS — F1120 Opioid dependence, uncomplicated: Secondary | ICD-10-CM | POA: Diagnosis not present

## 2021-12-04 DIAGNOSIS — F1120 Opioid dependence, uncomplicated: Secondary | ICD-10-CM | POA: Diagnosis not present

## 2021-12-04 DIAGNOSIS — R69 Illness, unspecified: Secondary | ICD-10-CM | POA: Diagnosis not present

## 2021-12-04 DIAGNOSIS — F1420 Cocaine dependence, uncomplicated: Secondary | ICD-10-CM | POA: Diagnosis not present

## 2021-12-04 NOTE — Therapy (Incomplete)
OUTPATIENT PHYSICAL THERAPY NEURO TREATMENT   Patient Name: Jane Watkins MRN: [id number] DOB:[birth year], 26 y.o., female Today's Date: 12/04/2021   PCP: Waunita Schooner, MD REFERRING PROVIDER: Izora Ribas, MD     Past Medical History:  Diagnosis Date   AKI (acute kidney injury) ([place]) 2018   "from overdose"   Anxiety    Chlamydia    Daily headache    Depression    Drug overdose 04/2016   [name] 05/01/2016   GERD (gastroesophageal reflux disease)    "when I was younger; gone now" (09/21/2017)   IV drug abuse ([place]) 09/20/2017   Migraine    "q couple weeks" (09/21/2017)   Opioid abuse ([place])    Overdose    Stroke [facility])    Past Surgical History:  Procedure Laterality Date   APPENDECTOMY  04/2013   I & D EXTREMITY Left 09/20/2017   Procedure: IRRIGATION AND DEBRIDEMENT LEFT ARM;  Surgeon: [name], MD;  Location: [place];  Service: Orthopedics;  Laterality: Left;   TEE WITHOUT CARDIOVERSION N/A 02/10/2019   Procedure: TRANSESOPHAGEAL ECHOCARDIOGRAM (TEE);  Surgeon: [name], MD;  Location: [facility];  Service: Cardiovascular;  Laterality: N/A;  Polysubstance abuser   TONGUE SURGERY  ~ 2007   "related to lisp"   TRACHEOSTOMY  04/2016   [name] 05/01/2016   Patient Active Problem List   Diagnosis Date Noted   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   Postcoital UTI 05/24/2019   Spastic hemiparesis (HCC)    Vascular headache    Mood disorder in conditions classified elsewhere    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   Opioid use disorder, mild, in sustained remission ([place])    Dyslipidemia    Ischemic stroke diagnosed during current admission ([place]) 02/13/2019   MDD (major depressive disorder), recurrent, in full remission ([place]) 02/10/2019   CVA (cerebral vascular accident) ([place]) 02/07/2019   Polysubstance abuse ([place]) 09/20/2017   TBI (traumatic brain injury) ([place]) 05/01/2016   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  Rationale for Evaluation and Treatment Rehabilitation  SUBJECTIVE:                                                                                                                                                                                              SUBJECTIVE STATEMENT: The patient is s/p R MCA CVA in 01/2019.  She has continued L UE/LE weakness.  She was L handed, but is working on R handed tasks for everything.  She reports occasional dizziness and L foot dragging. Pt accompanied by: self and friend Ryan  PERTINENT HISTORY: PMH: R MCA CVA (01/2019) , IV drug abuse, anxiety, depression, migraines, AKI, hypoxic ischemic encephalopathy 2018, opiate overdose 2018, h/o trach 2018   PAIN:  Are you having pain? Yes: NPRS scale: 4/10 Pain location: L lateral foot Pain description: aching and stabbing at times Aggravating factors: walking, weight bearing Relieving factors: nothing  PRECAUTIONS: Fall  FALLS: Has patient fallen in last 6 months? Yes. Number of falls 10  Pt reports falling on uneven surfaces, falling on stairs.  She reports that she sometimes gets dizzy and tends to fall over.  PATIENT GOALS Get more comfortable with walking position.  Get better L ankle positioning.  OBJECTIVE:    TODAY'S TREATMENT: 12/05/21 Activity Comments                          Below measures were taken at time of initial evaluation unless otherwise specified:   COGNITION: Overall cognitive status:  memory-- she reports she is getting an MRI   SENSATION: Light touch: Impaired  Dec'd sensation on the L thigh  COORDINATION: Slower speed due to motor impairments  MUSCLE TONE: LLE: increased muscle tone with ankle inversion  POSTURE:  maintains R weight shift  and L lateral border of foot in contact with the floor  LOWER EXTREMITY ROM:      Active  Right Eval Left Eval  Hip flexion    Hip extension    Hip abduction    Hip adduction    Hip internal rotation    Hip external rotation    Knee flexion    Knee extension    Ankle dorsiflexion  90 deg in WB position loading  Ankle plantarflexion    Ankle inversion    Ankle eversion     (Blank rows = not tested)  LOWER EXTREMITY MMT:    MMT Right Eval Left Eval  Hip flexion 5/5 4/5  Hip extension    Hip abduction    Hip adduction    Hip internal rotation    Hip external rotation    Knee flexion 5/5 4/5  Knee extension 5/5 4+/5  Ankle dorsiflexion 5/5 1/5  Ankle plantarflexion 5/5 1/5  Ankle inversion  2/5  Ankle eversion  0/5  (Blank rows = not tested)  BED MOBILITY:  independent  TRANSFERS: Assistive device utilized: None  Sit to stand: Modified independence Stand to sit: Modified independence STAIRS:  Level of Assistance: Modified independence  Stair Negotiation Technique: Alternating Pattern  with Single Rail on Right  Number of Stairs: 4   Height of Stairs: 4 and 6"  Comments: can ascend steps reciprocal pattern and chooses step to pattern descending with stronger leg.  When she attempts to descend with the L foot, she has increased muscle tone L LE  GAIT: Gait pattern:  L toe WB with ankle inversion, dec'd weight shifting to the left Distance walked: 100 Assistive device utilized: None Level of assistance: Modified independence Comments: slowed pace, does not get L foot in contact with the floor  FUNCTIONAL TESTs:  10 meter walk test: 12.97 seconds  =0.77 m/s 5 time sit to stand= 7.26 seconds BERG BALANCE=55/56  PATIENT SURVEYS:  N/a  TODAY'S TREATMENT:  11/26/21: See HEP  PATIENT EDUCATION: Education details: plan of care Person educated: Patient Education method: Explanation, Demonstration, and Handouts Education comprehension: verbalized understanding and returned demonstration  HOME EXERCISE PROGRAM:  Access Code:  [email or web address] URL: [email or web address] Date: 11/26/2021 Prepared by: Rudell Cobb  Exercises - Gastroc Stretch on Wall  - 2 x daily - 7 x weekly - 1 sets - 3 reps - 20 seconds hold - Seated Toe Flexion Extension PROM  - 2 x daily - 7 x weekly - 1 sets - 3 reps - 20 seconds hold - Single Leg Stance with Support  - 2 x daily - 7 x weekly - 1 sets - 3 reps - 30 seconds hold  GOALS: Goals reviewed with patient? Yes  SHORT TERM GOALS: Target date: 12/24/2021  The patient will be indep with HEP. Baseline: no current home program Goal status: IN PROGRESS  2.  The patient will improve gait mechanics demonstrating left foot flat during initial contact. Baseline: Only hits L foot lateral border near 5th digit on floor during initial contact. Goal status: IN PROGRESS  3.  The patient will negotiate curb and steps mod indep without a device. Baseline: Uses one rail.  Goal status: IN PROGRESS  LONG TERM GOALS: Target date: 01/21/2022  The patient will be indep with HEP progression. Baseline: no HEP Goal status: IN PROGRESS  2.  The patient will improve gait speed to 1 m/s Baseline: 0.77 m/s Goal status: IN PROGRESS  3.  The patient will report pain in L foot < or equal to 2/10 Baseline: 4/10 Goal status: IN PROGRESS  4.  The patient will move floor<>stand mod indep due to h/o falls. Baseline: reports 10 falls in last 6 months. Goal status: IN PROGRESS  5.  The patient will negotiate unlevel surfaces mod indep. Baseline: reports some falls on unlevel surfaces. Goal status: IN PROGRESS  ASSESSMENT:  CLINICAL IMPRESSION: Patient is a 26 y.o. female who was seen today for physical therapy evaluation and treatment for L spastic hemiparesis following a CVA in 01/2019.  She presents with dec'd distal strength with minimal isolated motor control of the L ankle and foot.  The patient has compensated to shift her weight to the right.  With cues she is able to place the L  foot flat prior to loading into mid stance.   OBJECTIVE IMPAIRMENTS Abnormal gait, difficulty walking, decreased ROM, decreased strength, dizziness, hypomobility, increased fascial restrictions, impaired flexibility, impaired tone, and pain.   ACTIVITY LIMITATIONS squatting and locomotion level  PARTICIPATION LIMITATIONS: cleaning and community activity  PERSONAL FACTORS 1-2 comorbidities: h/o migraines and CVA  are also affecting patient's functional outcome.   REHAB POTENTIAL: Good  CLINICAL DECISION MAKING: Stable/uncomplicated  EVALUATION COMPLEXITY: Low  PLAN: PT FREQUENCY: 2x/week  PT DURATION: 8 weeks + eval  PLANNED INTERVENTIONS: Therapeutic exercises, Therapeutic activity, Neuromuscular re-education, Balance training, Gait training, Patient/Family education, Self Care, Joint mobilization, Orthotic/Fit training, DME instructions, Aquatic Therapy, Dry Needling, Electrical stimulation, Moist heat, Taping, Manual therapy, and (precaution with e-stim-- she reports h/o seizures during drug use)  PLAN FOR NEXT SESSION: Joint mobs L ankle for ROM, focus on isolated motor control that is available in foot, ankle and toes, progress HEP, L standing weight shift, gait training, strengthening L ankle/foot.   Manuela Neptune, PT 12/04/2021, 11:30 AM

## 2021-12-05 ENCOUNTER — Ambulatory Visit: Payer: Medicare HMO | Admitting: Physical Therapy

## 2021-12-05 ENCOUNTER — Ambulatory Visit: Payer: Medicare HMO | Admitting: Occupational Therapy

## 2021-12-06 DIAGNOSIS — R69 Illness, unspecified: Secondary | ICD-10-CM | POA: Diagnosis not present

## 2021-12-06 DIAGNOSIS — F1120 Opioid dependence, uncomplicated: Secondary | ICD-10-CM | POA: Diagnosis not present

## 2021-12-06 DIAGNOSIS — F1420 Cocaine dependence, uncomplicated: Secondary | ICD-10-CM | POA: Diagnosis not present

## 2021-12-09 ENCOUNTER — Ambulatory Visit (HOSPITAL_COMMUNITY)
Admission: RE | Admit: 2021-12-09 | Discharge: 2021-12-09 | Disposition: A | Discharge status: Home / Self Care | Payer: Medicare HMO | Source: Ambulatory Visit | Attending: Physical Medicine and Rehabilitation | Admitting: Physical Medicine and Rehabilitation

## 2021-12-09 DIAGNOSIS — F1120 Opioid dependence, uncomplicated: Secondary | ICD-10-CM | POA: Diagnosis not present

## 2021-12-09 DIAGNOSIS — R4189 Other symptoms and signs involving cognitive functions and awareness: Secondary | ICD-10-CM | POA: Diagnosis not present

## 2021-12-09 DIAGNOSIS — G9389 Other specified disorders of brain: Secondary | ICD-10-CM | POA: Diagnosis not present

## 2021-12-09 DIAGNOSIS — R69 Illness, unspecified: Secondary | ICD-10-CM | POA: Diagnosis not present

## 2021-12-09 DIAGNOSIS — F1420 Cocaine dependence, uncomplicated: Secondary | ICD-10-CM | POA: Diagnosis not present

## 2021-12-09 DIAGNOSIS — G3184 Mild cognitive impairment, so stated: Secondary | ICD-10-CM | POA: Diagnosis not present

## 2021-12-09 DIAGNOSIS — I639 Cerebral infarction, unspecified: Secondary | ICD-10-CM | POA: Diagnosis not present

## 2021-12-10 ENCOUNTER — Ambulatory Visit: Payer: Medicare HMO | Admitting: Rehabilitative and Restorative Service Providers"

## 2021-12-10 ENCOUNTER — Ambulatory Visit: Payer: Medicare HMO | Admitting: Occupational Therapy

## 2021-12-10 DIAGNOSIS — F1120 Opioid dependence, uncomplicated: Secondary | ICD-10-CM | POA: Diagnosis not present

## 2021-12-10 DIAGNOSIS — R69 Illness, unspecified: Secondary | ICD-10-CM | POA: Diagnosis not present

## 2021-12-10 DIAGNOSIS — F1420 Cocaine dependence, uncomplicated: Secondary | ICD-10-CM | POA: Diagnosis not present

## 2021-12-11 DIAGNOSIS — F1120 Opioid dependence, uncomplicated: Secondary | ICD-10-CM | POA: Diagnosis not present

## 2021-12-11 DIAGNOSIS — F1420 Cocaine dependence, uncomplicated: Secondary | ICD-10-CM | POA: Diagnosis not present

## 2021-12-11 DIAGNOSIS — R69 Illness, unspecified: Secondary | ICD-10-CM | POA: Diagnosis not present

## 2021-12-11 NOTE — Therapy (Incomplete)
OUTPATIENT PHYSICAL THERAPY NEURO TREATMENT   Patient Name: Jane Watkins MRN: [id number] DOB:1995/08/03, 26 y.o., female Today's Date: 12/11/2021   PCP: Waunita Schooner, MD REFERRING PROVIDER: Izora Ribas, MD     Past Medical History:  Diagnosis Date   AKI (acute kidney injury) ([place]) 2018   "from overdose"   Anxiety    Chlamydia    Daily headache    Depression    Drug overdose 04/2016   [name] 05/01/2016   GERD (gastroesophageal reflux disease)    "when I was younger; gone now" (09/21/2017)   IV drug abuse ([place]) 09/20/2017   Migraine    "q couple weeks" (09/21/2017)   Opioid abuse ([place])    Overdose    Stroke [facility])    Past Surgical History:  Procedure Laterality Date   APPENDECTOMY  04/2013   I & D EXTREMITY Left 09/20/2017   Procedure: IRRIGATION AND DEBRIDEMENT LEFT ARM;  Surgeon: [name], MD;  Location: [place];  Service: Orthopedics;  Laterality: Left;   TEE WITHOUT CARDIOVERSION N/A 02/10/2019   Procedure: TRANSESOPHAGEAL ECHOCARDIOGRAM (TEE);  Surgeon: [name], MD;  Location: [facility];  Service: Cardiovascular;  Laterality: N/A;  Polysubstance abuser   TONGUE SURGERY  ~ 2007   "related to lisp"   TRACHEOSTOMY  04/2016   [name] 05/01/2016   Patient Active Problem List   Diagnosis Date Noted   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   Postcoital UTI 05/24/2019   Spastic hemiparesis (HCC)    Vascular headache    Mood disorder in conditions classified elsewhere    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   Opioid use disorder, mild, in sustained remission ([place])    Dyslipidemia    Ischemic stroke diagnosed during current admission ([place]) 02/13/2019   MDD (major depressive disorder), recurrent, in full remission ([place]) 02/10/2019   CVA (cerebral vascular accident) ([place]) 02/07/2019   Polysubstance abuse ([place]) 09/20/2017   TBI (traumatic brain injury) ([place]) 05/01/2016   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  Rationale for Evaluation and Treatment Rehabilitation  SUBJECTIVE:                                                                                                                                                                                              SUBJECTIVE STATEMENT: The patient is s/p R MCA CVA in 01/2019.  She has continued L UE/LE weakness.  She was L handed, but is working on R handed tasks for everything.  She reports occasional dizziness and L foot dragging. Pt accompanied by: self and friend Ryan  PERTINENT HISTORY: PMH: R MCA CVA (01/2019) , IV drug abuse, anxiety, depression, migraines, AKI, hypoxic ischemic encephalopathy 2018, opiate overdose 2018, h/o trach 2018   PAIN:  Are you having pain? Yes: NPRS scale: 4/10 Pain location: L lateral foot Pain description: aching and stabbing at times Aggravating factors: walking, weight bearing Relieving factors: nothing  PRECAUTIONS: Fall  FALLS: Has patient fallen in last 6 months? Yes. Number of falls 10  Pt reports falling on uneven surfaces, falling on stairs.  She reports that she sometimes gets dizzy and tends to fall over.  PATIENT GOALS Get more comfortable with walking position.  Get better L ankle positioning.  OBJECTIVE:    TODAY'S TREATMENT: 12/12/21 Activity Comments                          Below measures were taken at time of initial evaluation unless otherwise specified:   COGNITION: Overall cognitive status:  memory-- she reports she is getting an MRI   SENSATION: Light touch: Impaired  Dec'd sensation on the L thigh  COORDINATION: Slower speed due to motor impairments  MUSCLE TONE: LLE: increased muscle tone with ankle inversion  POSTURE:  maintains R weight shift  and L lateral border of foot in contact with the floor  LOWER EXTREMITY ROM:      Active  Right Eval Left Eval  Hip flexion    Hip extension    Hip abduction    Hip adduction    Hip internal rotation    Hip external rotation    Knee flexion    Knee extension    Ankle dorsiflexion  90 deg in WB position loading  Ankle plantarflexion    Ankle inversion    Ankle eversion     (Blank rows = not tested)  LOWER EXTREMITY MMT:    MMT Right Eval Left Eval  Hip flexion 5/5 4/5  Hip extension    Hip abduction    Hip adduction    Hip internal rotation    Hip external rotation    Knee flexion 5/5 4/5  Knee extension 5/5 4+/5  Ankle dorsiflexion 5/5 1/5  Ankle plantarflexion 5/5 1/5  Ankle inversion  2/5  Ankle eversion  0/5  (Blank rows = not tested)  BED MOBILITY:  independent  TRANSFERS: Assistive device utilized: None  Sit to stand: Modified independence Stand to sit: Modified independence STAIRS:  Level of Assistance: Modified independence  Stair Negotiation Technique: Alternating Pattern  with Single Rail on Right  Number of Stairs: 4   Height of Stairs: 4 and 6"  Comments: can ascend steps reciprocal pattern and chooses step to pattern descending with stronger leg.  When she attempts to descend with the L foot, she has increased muscle tone L LE  GAIT: Gait pattern:  L toe WB with ankle inversion, dec'd weight shifting to the left Distance walked: 100 Assistive device utilized: None Level of assistance: Modified independence Comments: slowed pace, does not get L foot in contact with the floor  FUNCTIONAL TESTs:  10 meter walk test: 12.97 seconds  =0.77 m/s 5 time sit to stand= 7.26 seconds BERG BALANCE=55/56  PATIENT SURVEYS:  N/a  TODAY'S TREATMENT:  11/26/21: See HEP  PATIENT EDUCATION: Education details: plan of care Person educated: Patient Education method: Explanation, Demonstration, and Handouts Education comprehension: verbalized understanding and returned demonstration  HOME EXERCISE PROGRAM:  Access Code:  [email or web address] URL: [email or web address] Date: 11/26/2021 Prepared by: Rudell Cobb  Exercises - Gastroc Stretch on Wall  - 2 x daily - 7 x weekly - 1 sets - 3 reps - 20 seconds hold - Seated Toe Flexion Extension PROM  - 2 x daily - 7 x weekly - 1 sets - 3 reps - 20 seconds hold - Single Leg Stance with Support  - 2 x daily - 7 x weekly - 1 sets - 3 reps - 30 seconds hold  GOALS: Goals reviewed with patient? Yes  SHORT TERM GOALS: Target date: 12/24/2021  The patient will be indep with HEP. Baseline: no current home program Goal status: IN PROGRESS  2.  The patient will improve gait mechanics demonstrating left foot flat during initial contact. Baseline: Only hits L foot lateral border near 5th digit on floor during initial contact. Goal status: IN PROGRESS  3.  The patient will negotiate curb and steps mod indep without a device. Baseline: Uses one rail.  Goal status: IN PROGRESS  LONG TERM GOALS: Target date: 01/21/2022  The patient will be indep with HEP progression. Baseline: no HEP Goal status: IN PROGRESS  2.  The patient will improve gait speed to 1 m/s Baseline: 0.77 m/s Goal status: IN PROGRESS  3.  The patient will report pain in L foot < or equal to 2/10 Baseline: 4/10 Goal status: IN PROGRESS  4.  The patient will move floor<>stand mod indep due to h/o falls. Baseline: reports 10 falls in last 6 months. Goal status: IN PROGRESS  5.  The patient will negotiate unlevel surfaces mod indep. Baseline: reports some falls on unlevel surfaces. Goal status: IN PROGRESS  ASSESSMENT:  CLINICAL IMPRESSION: Patient is a 26 y.o. female who was seen today for physical therapy evaluation and treatment for L spastic hemiparesis following a CVA in 01/2019.  She presents with dec'd distal strength with minimal isolated motor control of the L ankle and foot.  The patient has compensated to shift her weight to the right.  With cues she is able to place the L  foot flat prior to loading into mid stance.   OBJECTIVE IMPAIRMENTS Abnormal gait, difficulty walking, decreased ROM, decreased strength, dizziness, hypomobility, increased fascial restrictions, impaired flexibility, impaired tone, and pain.   ACTIVITY LIMITATIONS squatting and locomotion level  PARTICIPATION LIMITATIONS: cleaning and community activity  PERSONAL FACTORS 1-2 comorbidities: h/o migraines and CVA  are also affecting patient's functional outcome.   REHAB POTENTIAL: Good  CLINICAL DECISION MAKING: Stable/uncomplicated  EVALUATION COMPLEXITY: Low  PLAN: PT FREQUENCY: 2x/week  PT DURATION: 8 weeks + eval  PLANNED INTERVENTIONS: Therapeutic exercises, Therapeutic activity, Neuromuscular re-education, Balance training, Gait training, Patient/Family education, Self Care, Joint mobilization, Orthotic/Fit training, DME instructions, Aquatic Therapy, Dry Needling, Electrical stimulation, Moist heat, Taping, Manual therapy, and (precaution with e-stim-- she reports h/o seizures during drug use)  PLAN FOR NEXT SESSION: Joint mobs L ankle for ROM, focus on isolated motor control that is available in foot, ankle and toes, progress HEP, L standing weight shift, gait training, strengthening L ankle/foot.   Manuela Neptune, PT 12/11/2021, 8:32 AM

## 2021-12-12 ENCOUNTER — Ambulatory Visit: Payer: Medicare HMO | Admitting: Physical Therapy

## 2021-12-12 ENCOUNTER — Ambulatory Visit: Payer: Medicare HMO | Admitting: Occupational Therapy

## 2021-12-13 DIAGNOSIS — F1420 Cocaine dependence, uncomplicated: Secondary | ICD-10-CM | POA: Diagnosis not present

## 2021-12-13 DIAGNOSIS — R69 Illness, unspecified: Secondary | ICD-10-CM | POA: Diagnosis not present

## 2021-12-13 DIAGNOSIS — F1120 Opioid dependence, uncomplicated: Secondary | ICD-10-CM | POA: Diagnosis not present

## 2021-12-16 ENCOUNTER — Encounter: Payer: Medicare HMO | Attending: Physical Medicine and Rehabilitation | Admitting: Physical Medicine and Rehabilitation

## 2021-12-16 DIAGNOSIS — F1420 Cocaine dependence, uncomplicated: Secondary | ICD-10-CM | POA: Diagnosis not present

## 2021-12-16 DIAGNOSIS — F1120 Opioid dependence, uncomplicated: Secondary | ICD-10-CM | POA: Diagnosis not present

## 2021-12-16 DIAGNOSIS — I6330 Cerebral infarction due to thrombosis of unspecified cerebral artery: Secondary | ICD-10-CM | POA: Insufficient documentation

## 2021-12-16 DIAGNOSIS — R69 Illness, unspecified: Secondary | ICD-10-CM | POA: Diagnosis not present

## 2021-12-17 ENCOUNTER — Ambulatory Visit: Payer: Medicare HMO | Admitting: Physical Therapy

## 2021-12-17 ENCOUNTER — Ambulatory Visit: Payer: Medicare HMO | Admitting: Occupational Therapy

## 2021-12-18 DIAGNOSIS — R69 Illness, unspecified: Secondary | ICD-10-CM | POA: Diagnosis not present

## 2021-12-18 DIAGNOSIS — F1120 Opioid dependence, uncomplicated: Secondary | ICD-10-CM | POA: Diagnosis not present

## 2021-12-18 DIAGNOSIS — F1420 Cocaine dependence, uncomplicated: Secondary | ICD-10-CM | POA: Diagnosis not present
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
## 2021-12-18 NOTE — Therapy (Incomplete)
OUTPATIENT PHYSICAL THERAPY NEURO TREATMENT   Patient Name: Jane Watkins MRN: [id number] DOB:Oct 09, 1995, 26 y.o., female Today's Date: 12/18/2021   PCP: Gweneth Dimitri, MD REFERRING PROVIDER: Horton Chin, MD     Past Medical History:  Diagnosis Date   AKI (acute kidney injury) (HCC) 2018   "from overdose"   Anxiety    Chlamydia    Daily headache    Depression    Drug overdose 04/2016   [name] 05/01/2016   GERD (gastroesophageal reflux disease)    "when I was younger; gone now" (09/21/2017)   IV drug abuse (HCC) 09/20/2017   Migraine    "q couple weeks" (09/21/2017)   Opioid abuse (HCC)    Overdose    Stroke [facility])    Past Surgical History:  Procedure Laterality Date   APPENDECTOMY  04/2013   I & D EXTREMITY Left 09/20/2017   Procedure: IRRIGATION AND DEBRIDEMENT LEFT ARM;  Surgeon: Bradly Bienenstock, MD;  Location: [facility] OR;  Service: Orthopedics;  Laterality: Left;   TEE WITHOUT CARDIOVERSION N/A 02/10/2019   Procedure: TRANSESOPHAGEAL ECHOCARDIOGRAM (TEE);  Surgeon: Debbe Odea, MD;  Location: [facility] ORS;  Service: Cardiovascular;  Laterality: N/A;  Polysubstance abuser   TONGUE SURGERY  ~ 2007   "related to lisp"   TRACHEOSTOMY  04/2016   [name] 05/01/2016   Patient Active Problem List   Diagnosis Date Noted   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   Postcoital UTI 05/24/2019   Spastic hemiparesis (HCC)    Vascular headache    Mood disorder in conditions classified elsewhere    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  Rationale for Evaluation and Treatment Rehabilitation  SUBJECTIVE:                                                                                                                                                                                              SUBJECTIVE STATEMENT: The patient is s/p R MCA CVA in 01/2019.  She has continued L UE/LE weakness.  She was L handed, but is working on R handed tasks for everything.  She reports occasional dizziness and L foot dragging. Pt accompanied by: self and friend Ryan  PERTINENT HISTORY: PMH: R MCA CVA (01/2019) , IV drug abuse, anxiety, depression, migraines, AKI, hypoxic ischemic encephalopathy 2018, opiate overdose 2018, h/o trach 2018   PAIN:  Are you having pain? Yes: NPRS scale: 4/10 Pain location: L lateral foot Pain description: aching and stabbing at times Aggravating factors: walking, weight bearing Relieving factors: nothing  PRECAUTIONS: Fall  FALLS: Has patient fallen in last 6 months? Yes. Number of falls 10  Pt reports falling on uneven surfaces, falling on stairs.  She reports that she sometimes gets dizzy and tends to fall over.  PATIENT GOALS Get more comfortable with walking position.  Get better L ankle positioning.  OBJECTIVE:    TODAY'S TREATMENT: 12/19/21 Activity Comments                          Below measures were taken at time of initial evaluation unless otherwise specified:   COGNITION: Overall cognitive status:  memory-- she reports she is getting an MRI   SENSATION: Light touch: Impaired  Dec'd sensation on the L thigh  COORDINATION: Slower speed due to motor impairments  MUSCLE TONE: LLE: increased muscle tone with ankle inversion  POSTURE:  maintains R weight shift  and L lateral border of foot in contact with the floor  LOWER EXTREMITY ROM:      Active  Right Eval Left Eval  Hip flexion    Hip extension    Hip abduction    Hip adduction    Hip internal rotation    Hip external rotation    Knee flexion    Knee extension    Ankle dorsiflexion  90 deg in WB position loading  Ankle plantarflexion    Ankle inversion    Ankle eversion     (Blank rows = not tested)  LOWER EXTREMITY MMT:    MMT Right Eval Left Eval  Hip flexion 5/5 4/5  Hip extension    Hip abduction    Hip adduction    Hip internal rotation    Hip external rotation    Knee flexion 5/5 4/5  Knee extension 5/5 4+/5  Ankle dorsiflexion 5/5 1/5  Ankle plantarflexion 5/5 1/5  Ankle inversion  2/5  Ankle eversion  0/5  (Blank rows = not tested)  BED MOBILITY:  independent  TRANSFERS: Assistive device utilized: None  Sit to stand: Modified independence Stand to sit: Modified independence STAIRS:  Level of Assistance: Modified independence  Stair Negotiation Technique: Alternating Pattern  with Single Rail on Right  Number of Stairs: 4   Height of Stairs: 4 and 6"  Comments: can ascend steps reciprocal pattern and chooses step to pattern descending with stronger leg.  When she attempts to descend with the L foot, she has increased muscle tone L LE  GAIT: Gait pattern:  L toe WB with ankle inversion, dec'd weight shifting to the left Distance walked: 100 Assistive device utilized: None Level of assistance: Modified independence Comments: slowed pace, does not get L foot in contact with the floor  FUNCTIONAL TESTs:  10 meter walk test: 12.97 seconds  =0.77 m/s 5 time sit to stand= 7.26 seconds BERG BALANCE=55/56  PATIENT SURVEYS:  N/a  TODAY'S TREATMENT:  11/26/21: See HEP  PATIENT EDUCATION: Education details: plan of care Person educated: Patient Education method: Explanation, Demonstration, and Handouts Education comprehension: verbalized understanding and returned demonstration  HOME EXERCISE PROGRAM:  Access Code:  [email or web address] URL: [email or web address] Date: 11/26/2021 Prepared by: Margretta Ditty  Exercises - Gastroc Stretch on Wall  - 2 x daily - 7 x weekly - 1 sets - 3 reps - 20 seconds hold - Seated Toe Flexion Extension PROM  - 2 x daily - 7 x weekly - 1 sets - 3 reps - 20 seconds hold - Single Leg Stance with Support  - 2 x daily - 7 x weekly - 1 sets - 3 reps - 30 seconds hold  GOALS: Goals reviewed with patient? Yes  SHORT TERM GOALS: Target date: 12/24/2021  The patient will be indep with HEP. Baseline: no current home program Goal status: IN PROGRESS  2.  The patient will improve gait mechanics demonstrating left foot flat during initial contact. Baseline: Only hits L foot lateral border near 5th digit on floor during initial contact. Goal status: IN PROGRESS  3.  The patient will negotiate curb and steps mod indep without a device. Baseline: Uses one rail.  Goal status: IN PROGRESS  LONG TERM GOALS: Target date: 01/21/2022  The patient will be indep with HEP progression. Baseline: no HEP Goal status: IN PROGRESS  2.  The patient will improve gait speed to 1 m/s Baseline: 0.77 m/s Goal status: IN PROGRESS  3.  The patient will report pain in L foot < or equal to 2/10 Baseline: 4/10 Goal status: IN PROGRESS  4.  The patient will move floor<>stand mod indep due to h/o falls. Baseline: reports 10 falls in last 6 months. Goal status: IN PROGRESS  5.  The patient will negotiate unlevel surfaces mod indep. Baseline: reports some falls on unlevel surfaces. Goal status: IN PROGRESS  ASSESSMENT:  CLINICAL IMPRESSION: Patient is a 26 y.o. female who was seen today for physical therapy evaluation and treatment for L spastic hemiparesis following a CVA in 01/2019.  She presents with dec'd distal strength with minimal isolated motor control of the L ankle and foot.  The patient has compensated to shift her weight to the right.  With cues she is able to place the L  foot flat prior to loading into mid stance.   OBJECTIVE IMPAIRMENTS Abnormal gait, difficulty walking, decreased ROM, decreased strength, dizziness, hypomobility, increased fascial restrictions, impaired flexibility, impaired tone, and pain.   ACTIVITY LIMITATIONS squatting and locomotion level  PARTICIPATION LIMITATIONS: cleaning and community activity  PERSONAL FACTORS 1-2 comorbidities: h/o migraines and CVA  are also affecting patient's functional outcome.   REHAB POTENTIAL: Good  CLINICAL DECISION MAKING: Stable/uncomplicated  EVALUATION COMPLEXITY: Low  PLAN: PT FREQUENCY: 2x/week  PT DURATION: 8 weeks + eval  PLANNED INTERVENTIONS: Therapeutic exercises, Therapeutic activity, Neuromuscular re-education, Balance training, Gait training, Patient/Family education, Self Care, Joint mobilization, Orthotic/Fit training, DME instructions, Aquatic Therapy, Dry Needling, Electrical stimulation, Moist heat, Taping, Manual therapy, and (precaution with e-stim-- she reports h/o seizures during drug use)  PLAN FOR NEXT SESSION: Joint mobs L ankle for ROM, focus on isolated motor control that is available in foot, ankle and toes, progress HEP, L standing weight shift, gait training, strengthening L ankle/foot.   Tyrone Sage, PT 12/18/2021, 10:54 AM

## 2021-12-19 ENCOUNTER — Ambulatory Visit: Payer: Medicare HMO | Admitting: Occupational Therapy

## 2021-12-19 ENCOUNTER — Ambulatory Visit: Payer: Medicare HMO | Attending: Physical Medicine and Rehabilitation | Admitting: Physical Therapy

## 2021-12-23 DIAGNOSIS — F1110 Opioid abuse, uncomplicated: Secondary | ICD-10-CM | POA: Diagnosis not present

## 2021-12-23 DIAGNOSIS — F1420 Cocaine dependence, uncomplicated: Secondary | ICD-10-CM | POA: Diagnosis not present

## 2021-12-23 DIAGNOSIS — F319 Bipolar disorder, unspecified: Secondary | ICD-10-CM | POA: Diagnosis not present

## 2021-12-23 DIAGNOSIS — F4310 Post-traumatic stress disorder, unspecified: Secondary | ICD-10-CM | POA: Diagnosis not present

## 2021-12-23 DIAGNOSIS — F419 Anxiety disorder, unspecified: Secondary | ICD-10-CM | POA: Diagnosis not present

## 2021-12-23 DIAGNOSIS — F1220 Cannabis dependence, uncomplicated: Secondary | ICD-10-CM | POA: Diagnosis not present

## 2021-12-23 DIAGNOSIS — R69 Illness, unspecified: Secondary | ICD-10-CM | POA: Diagnosis not present

## 2021-12-23 DIAGNOSIS — F17200 Nicotine dependence, unspecified, uncomplicated: Secondary | ICD-10-CM | POA: Diagnosis not present

## 2021-12-24 ENCOUNTER — Ambulatory Visit: Payer: Medicare HMO | Admitting: Occupational Therapy

## 2021-12-24 ENCOUNTER — Ambulatory Visit: Payer: Medicare HMO | Admitting: Rehabilitative and Restorative Service Providers"

## 2021-12-26 ENCOUNTER — Ambulatory Visit: Payer: Medicare HMO | Admitting: Rehabilitative and Restorative Service Providers"

## 2021-12-26 ENCOUNTER — Ambulatory Visit: Payer: Medicare HMO | Admitting: Occupational Therapy

## 2021-12-28 DIAGNOSIS — F319 Bipolar disorder, unspecified: Secondary | ICD-10-CM | POA: Diagnosis not present

## 2021-12-28 DIAGNOSIS — F4310 Post-traumatic stress disorder, unspecified: Secondary | ICD-10-CM | POA: Diagnosis not present

## 2021-12-28 DIAGNOSIS — F419 Anxiety disorder, unspecified: Secondary | ICD-10-CM | POA: Diagnosis not present

## 2021-12-28 DIAGNOSIS — F1420 Cocaine dependence, uncomplicated: Secondary | ICD-10-CM | POA: Diagnosis not present

## 2021-12-28 DIAGNOSIS — F1220 Cannabis dependence, uncomplicated: Secondary | ICD-10-CM | POA: Diagnosis not present

## 2021-12-28 DIAGNOSIS — R69 Illness, unspecified: Secondary | ICD-10-CM | POA: Diagnosis not present

## 2021-12-28 DIAGNOSIS — F17200 Nicotine dependence, unspecified, uncomplicated: Secondary | ICD-10-CM | POA: Diagnosis not present

## 2021-12-28 DIAGNOSIS — F1110 Opioid abuse, uncomplicated: Secondary | ICD-10-CM | POA: Diagnosis not present

## 2021-12-29 DIAGNOSIS — F1420 Cocaine dependence, uncomplicated: Secondary | ICD-10-CM | POA: Diagnosis not present

## 2021-12-29 DIAGNOSIS — F17200 Nicotine dependence, unspecified, uncomplicated: Secondary | ICD-10-CM | POA: Diagnosis not present

## 2021-12-29 DIAGNOSIS — F4310 Post-traumatic stress disorder, unspecified: Secondary | ICD-10-CM | POA: Diagnosis not present

## 2021-12-29 DIAGNOSIS — F319 Bipolar disorder, unspecified: Secondary | ICD-10-CM | POA: Diagnosis not present

## 2021-12-29 DIAGNOSIS — R69 Illness, unspecified: Secondary | ICD-10-CM | POA: Diagnosis not present

## 2021-12-29 DIAGNOSIS — F1110 Opioid abuse, uncomplicated: Secondary | ICD-10-CM | POA: Diagnosis not present

## 2021-12-29 DIAGNOSIS — F419 Anxiety disorder, unspecified: Secondary | ICD-10-CM | POA: Diagnosis not present

## 2021-12-29 DIAGNOSIS — F1220 Cannabis dependence, uncomplicated: Secondary | ICD-10-CM | POA: Diagnosis not present

## 2021-12-30 DIAGNOSIS — F1420 Cocaine dependence, uncomplicated: Secondary | ICD-10-CM | POA: Diagnosis not present

## 2021-12-30 DIAGNOSIS — R69 Illness, unspecified: Secondary | ICD-10-CM | POA: Diagnosis not present

## 2021-12-30 DIAGNOSIS — F319 Bipolar disorder, unspecified: Secondary | ICD-10-CM | POA: Diagnosis not present

## 2021-12-30 DIAGNOSIS — F17200 Nicotine dependence, unspecified, uncomplicated: Secondary | ICD-10-CM | POA: Diagnosis not present

## 2021-12-30 DIAGNOSIS — F1220 Cannabis dependence, uncomplicated: Secondary | ICD-10-CM | POA: Diagnosis not present

## 2021-12-30 DIAGNOSIS — F419 Anxiety disorder, unspecified: Secondary | ICD-10-CM | POA: Diagnosis not present

## 2021-12-30 DIAGNOSIS — F1110 Opioid abuse, uncomplicated: Secondary | ICD-10-CM | POA: Diagnosis not present

## 2021-12-30 DIAGNOSIS — F4310 Post-traumatic stress disorder, unspecified: Secondary | ICD-10-CM | POA: Diagnosis not present

## 2021-12-31 DIAGNOSIS — F1420 Cocaine dependence, uncomplicated: Secondary | ICD-10-CM | POA: Diagnosis not present

## 2021-12-31 DIAGNOSIS — F419 Anxiety disorder, unspecified: Secondary | ICD-10-CM | POA: Diagnosis not present

## 2021-12-31 DIAGNOSIS — R69 Illness, unspecified: Secondary | ICD-10-CM | POA: Diagnosis not present

## 2021-12-31 DIAGNOSIS — F319 Bipolar disorder, unspecified: Secondary | ICD-10-CM | POA: Diagnosis not present

## 2021-12-31 DIAGNOSIS — F1110 Opioid abuse, uncomplicated: Secondary | ICD-10-CM | POA: Diagnosis not present

## 2021-12-31 DIAGNOSIS — F4310 Post-traumatic stress disorder, unspecified: Secondary | ICD-10-CM | POA: Diagnosis not present

## 2021-12-31 DIAGNOSIS — F1220 Cannabis dependence, uncomplicated: Secondary | ICD-10-CM | POA: Diagnosis not present

## 2021-12-31 DIAGNOSIS — F17200 Nicotine dependence, unspecified, uncomplicated: Secondary | ICD-10-CM | POA: Diagnosis not present

## 2022-01-01 DIAGNOSIS — F419 Anxiety disorder, unspecified: Secondary | ICD-10-CM | POA: Diagnosis not present

## 2022-01-01 DIAGNOSIS — F17200 Nicotine dependence, unspecified, uncomplicated: Secondary | ICD-10-CM | POA: Diagnosis not present

## 2022-01-01 DIAGNOSIS — F1220 Cannabis dependence, uncomplicated: Secondary | ICD-10-CM | POA: Diagnosis not present

## 2022-01-01 DIAGNOSIS — F1110 Opioid abuse, uncomplicated: Secondary | ICD-10-CM | POA: Diagnosis not present

## 2022-01-01 DIAGNOSIS — R69 Illness, unspecified: Secondary | ICD-10-CM | POA: Diagnosis not present

## 2022-01-01 DIAGNOSIS — F4310 Post-traumatic stress disorder, unspecified: Secondary | ICD-10-CM | POA: Diagnosis not present

## 2022-01-01 DIAGNOSIS — F319 Bipolar disorder, unspecified: Secondary | ICD-10-CM | POA: Diagnosis not present

## 2022-01-01 DIAGNOSIS — F1420 Cocaine dependence, uncomplicated: Secondary | ICD-10-CM | POA: Diagnosis not present

## 2022-01-05 ENCOUNTER — Emergency Department (HOSPITAL_COMMUNITY): Payer: Medicare HMO

## 2022-01-05 ENCOUNTER — Other Ambulatory Visit: Payer: Self-pay

## 2022-01-05 ENCOUNTER — Inpatient Hospital Stay (HOSPITAL_COMMUNITY)
Admission: EM | Admit: 2022-01-05 | Discharge: 2022-01-19 | DRG: 917 | Disposition: A | Discharge status: Other Acute Inpatient Hospital | Payer: Medicare HMO | Attending: Internal Medicine | Admitting: Internal Medicine

## 2022-01-05 ENCOUNTER — Inpatient Hospital Stay (HOSPITAL_COMMUNITY): Payer: Medicare HMO

## 2022-01-05 DIAGNOSIS — T40411A Poisoning by fentanyl or fentanyl analogs, accidental (unintentional), initial encounter: Principal | ICD-10-CM | POA: Diagnosis present

## 2022-01-05 DIAGNOSIS — R4182 Altered mental status, unspecified: Secondary | ICD-10-CM | POA: Diagnosis not present

## 2022-01-05 DIAGNOSIS — F1410 Cocaine abuse, uncomplicated: Secondary | ICD-10-CM | POA: Diagnosis not present

## 2022-01-05 DIAGNOSIS — I639 Cerebral infarction, unspecified: Secondary | ICD-10-CM | POA: Diagnosis not present

## 2022-01-05 DIAGNOSIS — E876 Hypokalemia: Secondary | ICD-10-CM | POA: Diagnosis not present

## 2022-01-05 DIAGNOSIS — E162 Hypoglycemia, unspecified: Secondary | ICD-10-CM | POA: Diagnosis not present

## 2022-01-05 DIAGNOSIS — Z882 Allergy status to sulfonamides status: Secondary | ICD-10-CM

## 2022-01-05 DIAGNOSIS — I69354 Hemiplegia and hemiparesis following cerebral infarction affecting left non-dominant side: Secondary | ICD-10-CM

## 2022-01-05 DIAGNOSIS — Z91048 Other nonmedicinal substance allergy status: Secondary | ICD-10-CM | POA: Diagnosis not present

## 2022-01-05 DIAGNOSIS — R69 Illness, unspecified: Secondary | ICD-10-CM | POA: Diagnosis not present

## 2022-01-05 DIAGNOSIS — I517 Cardiomegaly: Secondary | ICD-10-CM | POA: Diagnosis not present

## 2022-01-05 DIAGNOSIS — R739 Hyperglycemia, unspecified: Secondary | ICD-10-CM | POA: Diagnosis not present

## 2022-01-05 DIAGNOSIS — G4700 Insomnia, unspecified: Secondary | ICD-10-CM | POA: Diagnosis present

## 2022-01-05 DIAGNOSIS — Z743 Need for continuous supervision: Secondary | ICD-10-CM | POA: Diagnosis not present

## 2022-01-05 DIAGNOSIS — F4310 Post-traumatic stress disorder, unspecified: Secondary | ICD-10-CM | POA: Diagnosis present

## 2022-01-05 DIAGNOSIS — F1110 Opioid abuse, uncomplicated: Secondary | ICD-10-CM | POA: Diagnosis present

## 2022-01-05 DIAGNOSIS — J9601 Acute respiratory failure with hypoxia: Secondary | ICD-10-CM | POA: Diagnosis not present

## 2022-01-05 DIAGNOSIS — F17210 Nicotine dependence, cigarettes, uncomplicated: Secondary | ICD-10-CM | POA: Diagnosis not present

## 2022-01-05 DIAGNOSIS — T50901A Poisoning by unspecified drugs, medicaments and biological substances, accidental (unintentional), initial encounter: Secondary | ICD-10-CM | POA: Diagnosis not present

## 2022-01-05 DIAGNOSIS — T405X1A Poisoning by cocaine, accidental (unintentional), initial encounter: Secondary | ICD-10-CM | POA: Diagnosis present

## 2022-01-05 DIAGNOSIS — I468 Cardiac arrest due to other underlying condition: Secondary | ICD-10-CM | POA: Diagnosis not present

## 2022-01-05 DIAGNOSIS — J690 Pneumonitis due to inhalation of food and vomit: Secondary | ICD-10-CM | POA: Diagnosis present

## 2022-01-05 DIAGNOSIS — Z79899 Other long term (current) drug therapy: Secondary | ICD-10-CM | POA: Diagnosis not present

## 2022-01-05 DIAGNOSIS — T50994A Poisoning by other drugs, medicaments and biological substances, undetermined, initial encounter: Secondary | ICD-10-CM | POA: Diagnosis not present

## 2022-01-05 DIAGNOSIS — G931 Anoxic brain damage, not elsewhere classified: Secondary | ICD-10-CM | POA: Diagnosis present

## 2022-01-05 DIAGNOSIS — I469 Cardiac arrest, cause unspecified: Secondary | ICD-10-CM | POA: Diagnosis not present

## 2022-01-05 DIAGNOSIS — F1910 Other psychoactive substance abuse, uncomplicated: Secondary | ICD-10-CM | POA: Diagnosis not present

## 2022-01-05 DIAGNOSIS — Z808 Family history of malignant neoplasm of other organs or systems: Secondary | ICD-10-CM

## 2022-01-05 DIAGNOSIS — Z888 Allergy status to other drugs, medicaments and biological substances status: Secondary | ICD-10-CM | POA: Diagnosis not present

## 2022-01-05 DIAGNOSIS — G928 Other toxic encephalopathy: Secondary | ICD-10-CM | POA: Diagnosis not present

## 2022-01-05 DIAGNOSIS — Z801 Family history of malignant neoplasm of trachea, bronchus and lung: Secondary | ICD-10-CM

## 2022-01-05 DIAGNOSIS — D689 Coagulation defect, unspecified: Secondary | ICD-10-CM | POA: Diagnosis present

## 2022-01-05 DIAGNOSIS — F1999 Other psychoactive substance use, unspecified with unspecified psychoactive substance-induced disorder: Secondary | ICD-10-CM | POA: Insufficient documentation

## 2022-01-05 DIAGNOSIS — Z1152 Encounter for screening for COVID-19: Secondary | ICD-10-CM

## 2022-01-05 DIAGNOSIS — R0902 Hypoxemia: Secondary | ICD-10-CM | POA: Diagnosis not present

## 2022-01-05 DIAGNOSIS — E872 Acidosis, unspecified: Secondary | ICD-10-CM

## 2022-01-05 DIAGNOSIS — K7200 Acute and subacute hepatic failure without coma: Secondary | ICD-10-CM | POA: Diagnosis not present

## 2022-01-05 DIAGNOSIS — F32A Depression, unspecified: Secondary | ICD-10-CM | POA: Diagnosis not present

## 2022-01-05 DIAGNOSIS — G40909 Epilepsy, unspecified, not intractable, without status epilepticus: Secondary | ICD-10-CM | POA: Diagnosis not present

## 2022-01-05 DIAGNOSIS — J9690 Respiratory failure, unspecified, unspecified whether with hypoxia or hypercapnia: Secondary | ICD-10-CM | POA: Diagnosis not present

## 2022-01-05 DIAGNOSIS — Z4682 Encounter for fitting and adjustment of non-vascular catheter: Secondary | ICD-10-CM | POA: Diagnosis not present

## 2022-01-05 DIAGNOSIS — R55 Syncope and collapse: Secondary | ICD-10-CM | POA: Diagnosis not present

## 2022-01-05 DIAGNOSIS — K219 Gastro-esophageal reflux disease without esophagitis: Secondary | ICD-10-CM | POA: Diagnosis present

## 2022-01-05 DIAGNOSIS — T50904A Poisoning by unspecified drugs, medicaments and biological substances, undetermined, initial encounter: Secondary | ICD-10-CM

## 2022-01-05 DIAGNOSIS — I499 Cardiac arrhythmia, unspecified: Secondary | ICD-10-CM | POA: Diagnosis not present

## 2022-01-05 DIAGNOSIS — F332 Major depressive disorder, recurrent severe without psychotic features: Secondary | ICD-10-CM | POA: Diagnosis not present

## 2022-01-05 DIAGNOSIS — Z7982 Long term (current) use of aspirin: Secondary | ICD-10-CM

## 2022-01-05 DIAGNOSIS — Z751 Person awaiting admission to adequate facility elsewhere: Secondary | ICD-10-CM

## 2022-01-05 DIAGNOSIS — J984 Other disorders of lung: Secondary | ICD-10-CM | POA: Diagnosis not present

## 2022-01-05 DIAGNOSIS — F19182 Other psychoactive substance abuse with psychoactive substance-induced sleep disorder: Secondary | ICD-10-CM | POA: Diagnosis not present

## 2022-01-05 DIAGNOSIS — T887XXA Unspecified adverse effect of drug or medicament, initial encounter: Secondary | ICD-10-CM | POA: Diagnosis not present

## 2022-01-05 LAB — COMPREHENSIVE METABOLIC PANEL
ALT: 717 U/L — ABNORMAL HIGH (ref 0–44)
AST: 828 U/L — ABNORMAL HIGH (ref 15–41)
Albumin: 3.2 g/dL — ABNORMAL LOW (ref 3.5–5.0)
Alkaline Phosphatase: 52 U/L (ref 38–126)
Anion gap: 22 — ABNORMAL HIGH (ref 5–15)
BUN: 19 mg/dL (ref 6–20)
CO2: 10 mmol/L — ABNORMAL LOW (ref 22–32)
Calcium: 7.6 mg/dL — ABNORMAL LOW (ref 8.9–10.3)
Chloride: 100 mmol/L (ref 98–111)
Creatinine, Ser: 1.85 mg/dL — ABNORMAL HIGH (ref 0.44–1.00)
GFR, Estimated: 38 mL/min — ABNORMAL LOW (ref >60)
Glucose, Bld: >1200 mg/dL (ref 70–99)
Potassium: 4.4 mmol/L (ref 3.5–5.1)
Sodium: 132 mmol/L — ABNORMAL LOW (ref 135–145)
Total Bilirubin: 1.3 mg/dL — ABNORMAL HIGH (ref 0.3–1.2)
Total Protein: 5.3 g/dL — ABNORMAL LOW (ref 6.5–8.1)

## 2022-01-05 LAB — SODIUM, URINE, RANDOM: Sodium, Ur: 127 mmol/L

## 2022-01-05 LAB — URINALYSIS, ROUTINE W REFLEX MICROSCOPIC
Bilirubin Urine: NEGATIVE
Glucose, UA: 150 mg/dL — AB
Ketones, ur: 20 mg/dL — AB
Leukocytes,Ua: NEGATIVE
Nitrite: NEGATIVE
Protein, ur: 30 mg/dL — AB
Specific Gravity, Urine: 1.013 (ref 1.005–1.030)
pH: 5 (ref 5.0–8.0)

## 2022-01-05 LAB — I-STAT VENOUS BLOOD GAS, ED
Acid-base deficit: 23 mmol/L — ABNORMAL HIGH (ref 0.0–2.0)
Bicarbonate: 9.7 mmol/L — ABNORMAL LOW (ref 20.0–28.0)
Calcium, Ion: 0.98 mmol/L — ABNORMAL LOW (ref 1.15–1.40)
HCT: 30 % — ABNORMAL LOW (ref 36.0–46.0)
Hemoglobin: 10.2 g/dL — ABNORMAL LOW (ref 12.0–15.0)
O2 Saturation: 75 %
Potassium: 3.9 mmol/L (ref 3.5–5.1)
Sodium: 114 mmol/L — CL (ref 135–145)
TCO2: 11 mmol/L — ABNORMAL LOW (ref 22–32)
pCO2, Ven: 55.6 mmHg (ref 44–60)
pH, Ven: 6.852 — CL (ref 7.25–7.43)
pO2, Ven: 70 mmHg — ABNORMAL HIGH (ref 32–45)

## 2022-01-05 LAB — POCT I-STAT 7, (LYTES, BLD GAS, ICA,H+H)
Acid-base deficit: 1 mmol/L (ref 0.0–2.0)
Bicarbonate: 24.6 mmol/L (ref 20.0–28.0)
Calcium, Ion: 1.16 mmol/L (ref 1.15–1.40)
HCT: 37 % (ref 36.0–46.0)
Hemoglobin: 12.6 g/dL (ref 12.0–15.0)
O2 Saturation: 100 %
Patient temperature: 38.3
Potassium: 3.4 mmol/L — ABNORMAL LOW (ref 3.5–5.1)
Sodium: 142 mmol/L (ref 135–145)
TCO2: 26 mmol/L (ref 22–32)
pCO2 arterial: 44.3 mmHg (ref 32–48)
pH, Arterial: 7.358 (ref 7.35–7.45)
pO2, Arterial: 242 mmHg — ABNORMAL HIGH (ref 83–108)

## 2022-01-05 LAB — CBC
HCT: 41.9 % (ref 36.0–46.0)
Hemoglobin: 10.1 g/dL — ABNORMAL LOW (ref 12.0–15.0)
MCH: 30.7 pg (ref 26.0–34.0)
MCHC: 24.1 g/dL — ABNORMAL LOW (ref 30.0–36.0)
MCV: 127.4 fL — ABNORMAL HIGH (ref 80.0–100.0)
Platelets: 291 10*3/uL (ref 150–400)
RBC: 3.29 MIL/uL — ABNORMAL LOW (ref 3.87–5.11)
RDW: 13.4 % (ref 11.5–15.5)
WBC: 13 10*3/uL — ABNORMAL HIGH (ref 4.0–10.5)
nRBC: 0 % (ref 0.0–0.2)

## 2022-01-05 LAB — I-STAT ARTERIAL BLOOD GAS, ED
Acid-base deficit: 13 mmol/L — ABNORMAL HIGH (ref 0.0–2.0)
Acid-base deficit: 5 mmol/L — ABNORMAL HIGH (ref 0.0–2.0)
Bicarbonate: 17.3 mmol/L — ABNORMAL LOW (ref 20.0–28.0)
Bicarbonate: 22.3 mmol/L (ref 20.0–28.0)
Calcium, Ion: 0.99 mmol/L — ABNORMAL LOW (ref 1.15–1.40)
Calcium, Ion: 0.96 mmol/L — ABNORMAL LOW (ref 1.15–1.40)
HCT: 39 % (ref 36.0–46.0)
HCT: 37 % (ref 36.0–46.0)
Hemoglobin: 13.3 g/dL (ref 12.0–15.0)
Hemoglobin: 12.6 g/dL (ref 12.0–15.0)
O2 Saturation: 100 %
O2 Saturation: 86 %
Potassium: 4.1 mmol/L (ref 3.5–5.1)
Potassium: 3.8 mmol/L (ref 3.5–5.1)
Sodium: 138 mmol/L (ref 135–145)
Sodium: 140 mmol/L (ref 135–145)
TCO2: 19 mmol/L — ABNORMAL LOW (ref 22–32)
TCO2: 24 mmol/L (ref 22–32)
pCO2 arterial: 55.7 mmHg — ABNORMAL HIGH (ref 32–48)
pCO2 arterial: 49.7 mmHg — ABNORMAL HIGH (ref 32–48)
pH, Arterial: 7.099 — CL (ref 7.35–7.45)
pH, Arterial: 7.26 — ABNORMAL LOW (ref 7.35–7.45)
pO2, Arterial: 363 mmHg — ABNORMAL HIGH (ref 83–108)
pO2, Arterial: 60 mmHg — ABNORMAL LOW (ref 83–108)

## 2022-01-05 LAB — ECHOCARDIOGRAM COMPLETE
AR max vel: 2.18 cm2
AV Area VTI: 1.96 cm2
AV Area mean vel: 2.07 cm2
AV Mean grad: 4 mmHg
AV Peak grad: 6.7 mmHg
Ao pk vel: 1.29 m/s
Height: 66 in
S' Lateral: 2.6 cm

## 2022-01-05 LAB — PROTIME-INR
INR: 1.5 — ABNORMAL HIGH (ref 0.8–1.2)
Prothrombin Time: 18 seconds — ABNORMAL HIGH (ref 11.4–15.2)

## 2022-01-05 LAB — PROCALCITONIN: Procalcitonin: <0.1 ng/mL

## 2022-01-05 LAB — GLUCOSE, CAPILLARY
Glucose-Capillary: 261 mg/dL — ABNORMAL HIGH (ref 70–99)
Glucose-Capillary: 181 mg/dL — ABNORMAL HIGH (ref 70–99)
Glucose-Capillary: 170 mg/dL — ABNORMAL HIGH (ref 70–99)

## 2022-01-05 LAB — APTT: aPTT: 33 seconds (ref 24–36)

## 2022-01-05 LAB — RAPID URINE DRUG SCREEN, HOSP PERFORMED
Amphetamines: NOT DETECTED
Barbiturates: NOT DETECTED
Benzodiazepines: NOT DETECTED
Cocaine: POSITIVE — AB
Opiates: NOT DETECTED
Tetrahydrocannabinol: NOT DETECTED

## 2022-01-05 LAB — PHOSPHORUS: Phosphorus: >30 mg/dL — ABNORMAL HIGH (ref 2.5–4.6)

## 2022-01-05 LAB — MAGNESIUM: Magnesium: 2.8 mg/dL — ABNORMAL HIGH (ref 1.7–2.4)

## 2022-01-05 LAB — I-STAT BETA HCG BLOOD, ED (MC, WL, AP ONLY): I-stat hCG, quantitative: <5 m[IU]/mL (ref <5)

## 2022-01-05 LAB — CBG MONITORING, ED: Glucose-Capillary: 276 mg/dL — ABNORMAL HIGH (ref 70–99)

## 2022-01-05 LAB — TRIGLYCERIDES: Triglycerides: 32 mg/dL (ref <150)

## 2022-01-05 LAB — MRSA NEXT GEN BY PCR, NASAL: MRSA by PCR Next Gen: NOT DETECTED

## 2022-01-05 LAB — LACTIC ACID, PLASMA: Lactic Acid, Venous: >9 mmol/L (ref 0.5–1.9)

## 2022-01-05 LAB — TROPONIN I (HIGH SENSITIVITY): Troponin I (High Sensitivity): 42 ng/L — ABNORMAL HIGH (ref <18)

## 2022-01-05 MED ORDER — MAGNESIUM SULFATE 2 GM/50ML IV SOLN
2.0000 g | Freq: Once | INTRAVENOUS | Status: AC
  Administered 2022-01-05: 2 g via INTRAVENOUS

## 2022-01-05 MED ORDER — ORAL CARE MOUTH RINSE: 15.0000 mL | OROMUCOSAL | Status: DC | PRN

## 2022-01-05 MED ORDER — ONDANSETRON HCL 4 MG/2ML IJ SOLN: 4.0000 mg | Freq: Four times a day (QID) | INTRAMUSCULAR | Status: DC | PRN

## 2022-01-05 MED ORDER — BUSPIRONE HCL 15 MG PO TABS: 30.0000 mg | ORAL_TABLET | Freq: Three times a day (TID) | ORAL | Status: DC | PRN

## 2022-01-05 MED ORDER — SODIUM BICARBONATE 8.4 % IV SOLN
INTRAVENOUS | Status: DC
  Filled 2022-01-05 (×2): qty 1000

## 2022-01-05 MED ORDER — DEXTROSE IN LACTATED RINGERS 5 % IV SOLN: INTRAVENOUS | Status: DC

## 2022-01-05 MED ORDER — ACETAMINOPHEN 160 MG/5ML PO SOLN: 650.0000 mg | ORAL | Status: DC | PRN

## 2022-01-05 MED ORDER — ACETAMINOPHEN 650 MG RE SUPP: 650.0000 mg | RECTAL | Status: DC

## 2022-01-05 MED ORDER — ACETAMINOPHEN 325 MG PO TABS: 650.0000 mg | ORAL_TABLET | ORAL | Status: DC

## 2022-01-05 MED ORDER — ATORVASTATIN CALCIUM 10 MG PO TABS: 20.0000 mg | ORAL_TABLET | Freq: Every day | ORAL | Status: DC

## 2022-01-05 MED ORDER — FAMOTIDINE 20 MG PO TABS
10.0000 mg | ORAL_TABLET | Freq: Every day | ORAL | Status: DC
  Filled 2022-01-05: qty 1

## 2022-01-05 MED ORDER — SODIUM BICARBONATE 8.4 % IV SOLN
100.0000 meq | Freq: Once | INTRAVENOUS | Status: AC
  Administered 2022-01-05: 100 meq via INTRAVENOUS
  Filled 2022-01-05: qty 50

## 2022-01-05 MED ORDER — LACTATED RINGERS IV BOLUS: 1000.0000 mL | Freq: Once | INTRAVENOUS | Status: DC

## 2022-01-05 MED ORDER — PROPOFOL 1000 MG/100ML IV EMUL
5.0000 ug/kg/min | INTRAVENOUS | Status: DC
  Administered 2022-01-05: 10 ug/kg/min via INTRAVENOUS

## 2022-01-05 MED ORDER — LACTATED RINGERS IV SOLN: INTRAVENOUS | Status: DC

## 2022-01-05 MED ORDER — LAMOTRIGINE 25 MG PO TABS
25.0000 mg | ORAL_TABLET | Freq: Two times a day (BID) | ORAL | Status: DC
  Administered 2022-01-05 – 2022-01-06 (×2): 25 mg
  Filled 2022-01-05 (×4): qty 1

## 2022-01-05 MED ORDER — NOREPINEPHRINE 4 MG/250ML-% IV SOLN
2.0000 ug/min | INTRAVENOUS | Status: DC
  Filled 2022-01-05: qty 250

## 2022-01-05 MED ORDER — ACETAMINOPHEN 650 MG RE SUPP: 650.0000 mg | RECTAL | Status: DC | PRN

## 2022-01-05 MED ORDER — POLYETHYLENE GLYCOL 3350 17 G PO PACK
17.0000 g | PACK | Freq: Every day | ORAL | Status: DC
  Filled 2022-01-05: qty 1

## 2022-01-05 MED ORDER — ETOMIDATE 2 MG/ML IV SOLN
INTRAVENOUS | Status: DC | PRN
  Administered 2022-01-05: 20 mg via INTRAVENOUS

## 2022-01-05 MED ORDER — SODIUM CHLORIDE 0.9 % IV SOLN
10.0000 mg | Freq: Every day | INTRAVENOUS | Status: DC
  Administered 2022-01-05: 10 mg via INTRAVENOUS
  Filled 2022-01-05 (×2): qty 1

## 2022-01-05 MED ORDER — NALOXONE HCL 0.4 MG/ML IJ SOLN: 0.4000 mg | Freq: Once | INTRAMUSCULAR | Status: DC

## 2022-01-05 MED ORDER — CALCIUM GLUCONATE-NACL 1-0.675 GM/50ML-% IV SOLN
1.0000 g | Freq: Once | INTRAVENOUS | Status: AC
  Administered 2022-01-05: 1000 mg via INTRAVENOUS
  Filled 2022-01-05: qty 50

## 2022-01-05 MED ORDER — SODIUM CHLORIDE 0.9 % IV SOLN: INTRAVENOUS | Status: DC | PRN

## 2022-01-05 MED ORDER — GABAPENTIN 300 MG PO CAPS
300.0000 mg | ORAL_CAPSULE | Freq: Every day | ORAL | Status: DC
  Administered 2022-01-05: 300 mg
  Filled 2022-01-05: qty 1

## 2022-01-05 MED ORDER — SODIUM BICARBONATE 8.4 % IV SOLN
100.0000 meq | Freq: Once | INTRAVENOUS | Status: AC
  Administered 2022-01-05: 100 meq via INTRAVENOUS

## 2022-01-05 MED ORDER — SODIUM CHLORIDE 0.9 % IV SOLN
250.0000 mL | INTRAVENOUS | Status: DC
  Administered 2022-01-06: 250 mL via INTRAVENOUS

## 2022-01-05 MED ORDER — SODIUM CHLORIDE 0.9 % IV BOLUS
1000.0000 mL | Freq: Once | INTRAVENOUS | Status: AC
  Administered 2022-01-05: 1000 mL via INTRAVENOUS

## 2022-01-05 MED ORDER — FENTANYL CITRATE PF 50 MCG/ML IJ SOSY
50.0000 ug | PREFILLED_SYRINGE | INTRAMUSCULAR | Status: AC | PRN
  Administered 2022-01-05 – 2022-01-06 (×3): 50 ug via INTRAVENOUS
  Filled 2022-01-05 (×3): qty 1

## 2022-01-05 MED ORDER — FENTANYL CITRATE PF 50 MCG/ML IJ SOSY
50.0000 ug | PREFILLED_SYRINGE | INTRAMUSCULAR | Status: DC | PRN
  Administered 2022-01-06 (×3): 50 ug via INTRAVENOUS
  Filled 2022-01-05 (×2): qty 2
  Filled 2022-01-05: qty 1

## 2022-01-05 MED ORDER — ROCURONIUM BROMIDE 50 MG/5ML IV SOLN
INTRAVENOUS | Status: DC | PRN
  Administered 2022-01-05: 80 mg via INTRAVENOUS

## 2022-01-05 MED ORDER — FENTANYL CITRATE PF 50 MCG/ML IJ SOSY: 50.0000 ug | PREFILLED_SYRINGE | Freq: Once | INTRAMUSCULAR | Status: DC

## 2022-01-05 MED ORDER — DEXTROSE 50 % IV SOLN
1.0000 | Freq: Once | INTRAVENOUS | Status: AC
  Administered 2022-01-05: 50 mL via INTRAVENOUS

## 2022-01-05 MED ORDER — DOCUSATE SODIUM 50 MG/5ML PO LIQD
100.0000 mg | Freq: Two times a day (BID) | ORAL | Status: DC
  Administered 2022-01-05 – 2022-01-06 (×3): 100 mg
  Filled 2022-01-05 (×3): qty 10

## 2022-01-05 MED ORDER — ACETAMINOPHEN 160 MG/5ML PO SOLN
650.0000 mg | ORAL | Status: DC
  Administered 2022-01-05 – 2022-01-06 (×5): 650 mg
  Filled 2022-01-05 (×5): qty 20.3

## 2022-01-05 MED ORDER — ACETAMINOPHEN 325 MG PO TABS: 650.0000 mg | ORAL_TABLET | ORAL | Status: DC | PRN

## 2022-01-05 MED ORDER — FENTANYL 2500MCG IN NS 250ML (10MCG/ML) PREMIX INFUSION: 50.0000 ug/h | INTRAVENOUS | Status: DC

## 2022-01-05 MED ORDER — ORAL CARE MOUTH RINSE
15.0000 mL | OROMUCOSAL | Status: DC
  Administered 2022-01-05 – 2022-01-06 (×10): 15 mL via OROMUCOSAL

## 2022-01-05 MED ORDER — FENTANYL BOLUS VIA INFUSION: 50.0000 ug | INTRAVENOUS | Status: DC | PRN

## 2022-01-05 NOTE — Progress Notes (Addendum)
Bite block placed on patient due to her biting down on ET tube and RT not being able to pass suction catheter. Patient tolerated well other than coughing and waking up.
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
## 2022-01-05 NOTE — Progress Notes (Signed)
  Echocardiogram 2D Echocardiogram has been performed.  Gerda Diss 01/05/2022, 1:58 PM

## 2022-01-05 NOTE — Progress Notes (Signed)
Pt transported on vent to 70m12 from trauma A without complications.

## 2022-01-05 NOTE — Progress Notes (Signed)
EEG complete - results pending 

## 2022-01-05 NOTE — ED Notes (Signed)
Verbal order to DC propofol

## 2022-01-05 NOTE — Progress Notes (Signed)
   Brief PM rounds - she is now in 37m12 Step mom and dad at bedside- they spoke to [place] the APP No new changes    SIGNATURE    Dr. Kalman Shan, M.D., F.C.C.P,  Pulmonary and Critical Care Medicine Staff Physician, [facility] Director - Interstitial Lung Disease  Program  Medical Director - [facility] Pulmonary Fibrosis [facility] at [facility] [place], [place], [address]   Pager: [phone], If no answer  -> Check AMION or Try [phone] Telephone (clinical office): [phone] Telephone (research): [phone]  6:12 PM 01/05/2022

## 2022-01-05 NOTE — ED Provider Notes (Addendum)
[facility] Provider Note   CSN: [id number] Arrival date & time: 01/05/22  1228     History  Chief Complaint  Patient presents with   Drug Overdose   CPR   Post Cardiac arrest     Jane Watkins is a 26 y.o. female.  With PMH of polysubstance abuse and history of CVA, anxiety brought in by EMS postcardiac arrest.  They were called by bystander for unresponsiveness.  They did endorse that she was using fentanyl and crack.  She was given 8 mg of intranasal Narcan followed by 2 IO Narcan.  Found to be in asystolic arrest.  She was coded for 20 minutes and given 2 doses of epinephrine with ROSC.  King airway was placed as well as right tibial IO.  Patient critically ill and unable to provide any HPI.  HPI     Home Medications Prior to Admission medications   Medication Sig Start Date End Date Taking? Authorizing Provider  mirtazapine (REMERON) 15 MG tablet Take 15 mg by mouth at bedtime. 12/19/21  Yes [provider]  nitrofurantoin, macrocrystal-monohydrate, (MACROBID) 100 MG capsule Take 100 mg by mouth 2 (two) times daily. 12/27/21  Yes [provider]  OXcarbazepine (TRILEPTAL) 150 MG tablet Take 150 mg by mouth 2 (two) times daily. 12/26/21  Yes [provider]  acetaminophen (TYLENOL) 325 MG tablet Take 2 tablets (650 mg total) by mouth every 4 (four) hours as needed for mild pain (or temp > 37.5 C (99.5 F)). 02/28/19   Angiulli, Mcarthur Rossettianiel J, PA-C  aspirin EC 81 MG tablet Take 1 tablet (81 mg total) by mouth daily. Swallow whole. 06/04/20   Ihor AustinMcCue, Jessica, NP  atorvastatin (LIPITOR) 20 MG tablet Take 1 tablet (20 mg total) by mouth daily at 6 PM. 06/04/20   Ihor AustinMcCue, Jessica, NP  baclofen (LIORESAL) 10 MG tablet Take 1 tablet (10 mg total) by mouth 3 (three) times daily. 08/10/21   Raulkar, Drema PryKrutika P, MD  benzoyl peroxide 5 % external liquid Apply topically 2 (two) times daily.    [provider]  busPIRone (BUSPAR)  10 MG tablet TAKE 1 TABLET BY MOUTH THREE TIMES A DAY 11/16/19   Raulkar, Drema PryKrutika P, MD  esomeprazole (NEXIUM) 40 MG capsule Take 1 capsule (40 mg total) by mouth daily at 12 noon. 02/10/20   Raulkar, Drema PryKrutika P, MD  gabapentin (NEURONTIN) 300 MG capsule Take 1 capsule (300 mg total) by mouth at bedtime. 10/23/21   Raulkar, Drema PryKrutika P, MD  lamoTRIgine (LAMICTAL) 25 MG tablet Take by mouth. 06/02/21   [provider]  naloxone (NARCAN) nasal spray 4 mg/0.1 mL ADMINISTER A SINGLE SPRAY IN ONE NOSTRIL UPON SIGNS OF OPIOID OVERDOSE. REPEAT EVERY 2 - 3 MINUTES IF NO RESPONSE IN ALTERNATING NOSTRIL TIL EMS ARRIVES. 10/30/20   [provider]  nicotine (NICODERM CQ - DOSED IN MG/24 HOURS) 21 mg/24hr patch Place 1 patch (21 mg total) onto the skin daily. 06/04/20   Ihor AustinMcCue, Jessica, NP  propranolol (INDERAL) 10 MG tablet Take 1 tablet (10 mg total) by mouth daily as needed. 10/23/21   Raulkar, Drema PryKrutika P, MD  QUEtiapine (SEROQUEL) 50 MG tablet Take 1 tablet (50 mg total) by mouth at bedtime. 10/23/21   Raulkar, Drema PryKrutika P, MD  spironolactone (ALDACTONE) 50 MG tablet Take 1 tablet (50 mg total) by mouth 2 (two) times daily. 12/12/20   Raulkar, Drema PryKrutika P, MD  SUMAtriptan (IMITREX) 50 MG tablet Take 1 tablet by mouth every  2 hours as needed for migraine. May repeat in 2 hours if headache persists or recurs. 01/29/21   Raulkar, Drema Pry, MD  tiZANidine (ZANAFLEX) 4 MG tablet Take 1 tablet (4 mg total) by mouth 3 (three) times daily. 10/23/21   Horton Chin, MD  tretinoin (RETIN-A) 0.05 % cream As directed 06/07/19   [provider]      Allergies    Pristiq [desvenlafaxine succinate er], Desvenlafaxine, Other, and Sulfa antibiotics    Review of Systems   Review of Systems  Physical Exam Updated Vital Signs BP 136/71   Pulse (!) 131   Temp (!) 92.2 F (33.4 C)   Resp (!) 30   Ht  (1.676 m)   SpO2 92%   BMI 20.50 kg/m  Physical Exam Constitutional: Unresponsive with King  airway in place Eyes: Conjunctivae are normal. PE3 minimally reactive and round ENT      Head: Normocephalic and atraumatic. Cardiovascular: S1, S2, tachycardic, equal palpable femoral and radial pulses Respiratory: Intermittent agonal respirations with King airway in place Gastrointestinal: Soft and nondistended Musculoskeletal: No pitting edema of lower extremities Neurologic: Unresponsive, minimally reactive to pain, pupils minimally reactive, King airway in place intermittently taking agonal respirations Skin: Skin is warm, dry with scattered track marks of upper extremities Psychiatric: Unresponsive  ED Results / Procedures / Treatments   Labs (all labs ordered are listed, but only abnormal results are displayed) Labs Reviewed  COMPREHENSIVE METABOLIC PANEL - Abnormal; Notable for the following components:      Result Value   Sodium 132 (*)    CO2 10 (*)    Glucose, Bld >1,200 (*)    Creatinine, Ser 1.85 (*)    Calcium 7.6 (*)    Total Protein 5.3 (*)    Albumin 3.2 (*)    AST 828 (*)    ALT 717 (*)    Total Bilirubin 1.3 (*)    GFR, Estimated 38 (*)    Anion gap 22 (*)    All other components within normal limits  LACTIC ACID, PLASMA - Abnormal; Notable for the following components:   Lactic Acid, Venous >9.0 (*)    All other components within normal limits  CBC - Abnormal; Notable for the following components:   WBC 13.0 (*)    RBC 3.29 (*)    Hemoglobin 10.1 (*)    MCV 127.4 (*)    MCHC 24.1 (*)    All other components within normal limits  PROTIME-INR - Abnormal; Notable for the following components:   Prothrombin Time 18.0 (*)    INR 1.5 (*)    All other components within normal limits  MAGNESIUM - Abnormal; Notable for the following components:   Magnesium 2.8 (*)    All other components within normal limits  PHOSPHORUS - Abnormal; Notable for the following components:   Phosphorus >30.0 (*)    All other components within normal limits  CBG MONITORING, ED  - Abnormal; Notable for the following components:   Glucose-Capillary 276 (*)    All other components within normal limits  I-STAT VENOUS BLOOD GAS, ED - Abnormal; Notable for the following components:   pH, Ven 6.852 (*)    pO2, Ven 70 (*)    Bicarbonate 9.7 (*)    TCO2 11 (*)    Acid-base deficit 23.0 (*)    Sodium 114 (*)    Calcium, Ion 0.98 (*)    HCT 30.0 (*)    Hemoglobin 10.2 (*)  All other components within normal limits  I-STAT ARTERIAL BLOOD GAS, ED - Abnormal; Notable for the following components:   pH, Arterial 7.099 (*)    pCO2 arterial 55.7 (*)    pO2, Arterial 363 (*)    Bicarbonate 17.3 (*)    TCO2 19 (*)    Acid-base deficit 13.0 (*)    Calcium, Ion 0.99 (*)    All other components within normal limits  TROPONIN I (HIGH SENSITIVITY) - Abnormal; Notable for the following components:   Troponin I (High Sensitivity) 42 (*)    All other components within normal limits  URINE CULTURE  RESPIRATORY PANEL BY PCR  APTT  PROCALCITONIN  TRIGLYCERIDES  BLOOD GAS, ARTERIAL  LACTIC ACID, PLASMA  LACTIC ACID, PLASMA  LACTIC ACID, PLASMA  RAPID URINE DRUG SCREEN, HOSP PERFORMED  URINALYSIS, ROUTINE W REFLEX MICROSCOPIC  HIV ANTIBODY (ROUTINE TESTING W REFLEX)  BLOOD GAS, ARTERIAL  COMPREHENSIVE METABOLIC PANEL  COMPREHENSIVE METABOLIC PANEL  PROCALCITONIN  OSMOLALITY, URINE  SODIUM, URINE, RANDOM  OSMOLALITY  TSH  CORTISOL  URINE DRUGS OF ABUSE SCREEN W ALC, ROUTINE (REF LAB)  BLOOD GAS, ARTERIAL  I-STAT BETA HCG BLOOD, ED ([facility], [facility], [facility] ONLY)  I-STAT VENOUS BLOOD GAS, ED  TROPONIN I (HIGH SENSITIVITY)    EKG None  Radiology ECHOCARDIOGRAM COMPLETE  Result Date: 01/05/2022    ECHOCARDIOGRAM REPORT   Patient Name:   Jane Watkins Date of Exam: 01/05/2022 Medical Rec #:  [id number]      Height:       66.0 in Accession #:    [id number]     Weight:       127.0 lb Date of Birth:  08-May-1995       BSA:          1.649 m Patient Age:    26 years       BP:            100/60 mmHg Patient Gender: F              HR:           108 bpm. Exam Location:  Inpatient Procedure: 2D Echo, Cardiac Doppler and Color Doppler                        STAT ECHO Reported to: Dr Rachelle Hora Croitoru on 01/05/2022 1:57:00 PM. Indications:    Cardiomegaly  History:        Patient has prior history of Echocardiogram examinations, most                 recent 02/10/2019. Risk Factors:Dyslipidemia and Current Smoker.                 Polysubstance abuse. Hx CVA.  Sonographer:    Ross Ludwig RDCS (AE) Referring Phys: Mardene Sayer  Sonographer Comments: Echo performed with patient supine and on artificial respirator. IMPRESSIONS  1. Left ventricular ejection fraction, by estimation, is 55 to 60%. The left ventricle has normal function. The left ventricle has no regional wall motion abnormalities. Left ventricular diastolic parameters were normal.  2. Right ventricular systolic function is normal. The right ventricular size is normal. Tricuspid regurgitation signal is inadequate for assessing PA pressure.  3. The mitral valve is grossly normal. Trivial mitral valve regurgitation.  4. The aortic valve is tricuspid. Aortic valve regurgitation is not visualized. Aortic valve mean gradient measures 4.0 mmHg.  5. Unable to estimate CVP. Comparison(s): Prior images reviewed  side by side. LVEF remains normal range at 55-60%. FINDINGS  Left Ventricle: Left ventricular ejection fraction, by estimation, is 55 to 60%. The left ventricle has normal function. The left ventricle has no regional wall motion abnormalities. The left ventricular internal cavity size was normal in size. There is  no left ventricular hypertrophy. Left ventricular diastolic parameters were normal. Right Ventricle: The right ventricular size is normal. No increase in right ventricular wall thickness. Right ventricular systolic function is normal. Tricuspid regurgitation signal is inadequate for assessing PA pressure. Left Atrium: Left atrial size  was normal in size. Right Atrium: Right atrial size was normal in size. Pericardium: There is no evidence of pericardial effusion. Mitral Valve: The mitral valve is grossly normal. Trivial mitral valve regurgitation. Tricuspid Valve: The tricuspid valve is grossly normal. Tricuspid valve regurgitation is trivial. Aortic Valve: The aortic valve is tricuspid. Aortic valve regurgitation is not visualized. Aortic valve mean gradient measures 4.0 mmHg. Aortic valve peak gradient measures 6.7 mmHg. Aortic valve area, by VTI measures 1.96 cm. Pulmonic Valve: The pulmonic valve was not well visualized. Pulmonic valve regurgitation is not visualized. Aorta: The aortic root is normal in size and structure. Venous: Unable to estimate CVP. IVC assessment for right atrial pressure unable to be performed due to mechanical ventilation. IAS/Shunts: The interatrial septum was not well visualized.  LEFT VENTRICLE PLAX 2D LVIDd:         3.70 cm LVIDs:         2.60 cm LV PW:         1.00 cm LV IVS:        0.80 cm LVOT diam:     1.90 cm LV SV:         44 LV SV Index:   27 LVOT Area:     2.84 cm  RIGHT VENTRICLE             IVC RV Basal diam:  2.70 cm     IVC diam: 2.10 cm RV S prime:     12.00 cm/s TAPSE (M-mode): 2.0 cm LEFT ATRIUM             Index        RIGHT ATRIUM           Index LA diam:        2.30 cm 1.39 cm/m   RA Area:     10.60 cm LA Vol (A2C):   29.8 ml 18.07 ml/m  RA Volume:   22.80 ml  13.83 ml/m LA Vol (A4C):   10.8 ml 6.55 ml/m LA Biplane Vol: 18.8 ml 11.40 ml/m  AORTIC VALVE AV Area (Vmax):    2.18 cm AV Area (Vmean):   2.07 cm AV Area (VTI):     1.96 cm AV Vmax:           129.00 cm/s AV Vmean:          89.800 cm/s AV VTI:            0.226 m AV Peak Grad:      6.7 mmHg AV Mean Grad:      4.0 mmHg LVOT Vmax:         99.00 cm/s LVOT Vmean:        65.500 cm/s LVOT VTI:          0.156 m LVOT/AV VTI ratio: 0.69  AORTA Ao Root diam: 2.70 cm  SHUNTS Systemic VTI:  0.16 m Systemic Diam: 1.90 cm Nona Dell MD  Electronically signed by Nona Dell MD Signature Date/Time: 01/05/2022/2:04:12 PM    Final    CT HEAD WO CONTRAST  Result Date: 01/05/2022 CLINICAL DATA:  Post CPR/overdose. EXAM: CT HEAD WITHOUT CONTRAST TECHNIQUE: Contiguous axial images were obtained from the base of the skull through the vertex without intravenous contrast. RADIATION DOSE REDUCTION: This exam was performed according to the departmental dose-optimization program which includes automated exposure control, adjustment of the mA and/or kV according to patient size and/or use of iterative reconstruction technique. COMPARISON:  CT head 04/29/2021. FINDINGS: Brain: There is no evidence of acute intracranial hemorrhage, extra-axial fluid collection, or acute infarct. There is no evidence of global anoxic injury by CT. Remote infarcts in the right basal ganglia and high right frontal and parietal lobes is unchanged, with stable slight ex vacuo dilatation of the body of the right lateral ventricle. Background parenchymal volume is otherwise normal. The ventricles are otherwise normal in size. There is no mass lesion.  There is no mass effect or midline shift. Vascular: No hyperdense vessel or unexpected calcification. Skull: Normal. Negative for fracture or focal lesion. Sinuses/Orbits: Layering fluid in the sinuses is likely related to instrumentation. The globes and orbits are unremarkable. Other: None. IMPRESSION: 1. No evidence of acute intracranial pathology. No definite evidence of anoxic injury by CT. Short interval follow-up CT or MRI may be considered if there is continued clinical concern. 2. Unchanged remote infarcts in the right cerebral hemisphere. Electronically Signed   By: Lesia Hausen M.D.   On: 01/05/2022 13:39    Procedures .Critical Care  Performed by: Mardene Sayer, MD Authorized by: Mardene Sayer, MD   Critical care provider statement:    Critical care time (minutes):  40   Critical care was necessary to  treat or prevent imminent or life-threatening deterioration of the following conditions:  Respiratory failure, cardiac failure and metabolic crisis   Critical care was time spent personally by me on the following activities:  Development of treatment plan with patient or surrogate, discussions with consultants, evaluation of patient's response to treatment, examination of patient, ordering and review of laboratory studies, ordering and review of radiographic studies, ordering and performing treatments and interventions, pulse oximetry, re-evaluation of patient's condition, review of old charts and obtaining history from patient or surrogate   Care discussed with: admitting provider   Date/Time: 01/05/2022 2:43 PM  Performed by: Mardene Sayer, MDPre-anesthesia Checklist: Emergency Drugs available and Patient identified Preoxygenation: Pre-oxygenation with 100% oxygen (King Airway in place) Induction Type: IV induction and Rapid sequence Laryngoscope Size: Glidescope and 3 Grade View: Grade III Tube size: 7.5 mm Number of attempts: 1 Placement Confirmation: ETT inserted through vocal cords under direct vision, Positive ETCO2, Breath sounds checked- equal and bilateral and CO2 detector Secured at: 22 cm Tube secured with: ETT holder Dental Injury: Bloody posterior oropharynx  Future Recommendations: Recommend- induction with short-acting agent, and alternative techniques readily available    Ultrasound ED Peripheral IV (Provider)  Date/Time: 01/05/2022 6:46 PM  Performed by: Mardene Sayer, MD Authorized by: Mardene Sayer, MD   Procedure details:    Indications: multiple failed IV attempts     Location:  Right AC   Angiocath:  20 G   Bedside Ultrasound Guided: Yes     Images: not archived     Patient tolerated procedure without complications: Yes     Dressing applied: Yes       Medications Ordered in ED Medications  etomidate (AMIDATE) injection (20  mg Intravenous  Given 01/05/22 1242)  rocuronium (ZEMURON) injection (80 mg Intravenous Given 01/05/22 1242)  lamoTRIgine (LAMICTAL) tablet 25 mg (has no administration in time range)  gabapentin (NEURONTIN) capsule 300 mg (has no administration in time range)  docusate (COLACE) 50 MG/5ML liquid 100 mg (100 mg Per Tube Given 01/05/22 1506)  polyethylene glycol (MIRALAX / GLYCOLAX) packet 17 g (has no administration in time range)  ondansetron (ZOFRAN) injection 4 mg (has no administration in time range)  acetaminophen (TYLENOL) tablet 650 mg ( Oral See Alternative 01/05/22 1509)    Or  acetaminophen (TYLENOL) 160 MG/5ML solution 650 mg (650 mg Per Tube Given 01/05/22 1509)    Or  acetaminophen (TYLENOL) suppository 650 mg ( Rectal See Alternative 01/05/22 1509)  busPIRone (BUSPAR) tablet 30 mg (has no administration in time range)    Or  busPIRone (BUSPAR) tablet 30 mg (has no administration in time range)  norepinephrine (LEVOPHED) 4mg  in (0.016 mg/mL) premix infusion (0 mcg/min Intravenous Hold 01/05/22 1451)  0.9 %  sodium chloride infusion (0 mLs Intravenous Hold 01/05/22 1447)  famotidine (PEPCID) 10 mg in sodium chloride 0.9 % 25 mL (10 mg Intravenous New Bag/Given 01/05/22 1516)  dextrose 5 % in lactated ringers infusion (has no administration in time range)  fentaNYL (SUBLIMAZE) injection 50 mcg (has no administration in time range)  fentaNYL (SUBLIMAZE) injection 50-200 mcg (has no administration in time range)  sodium bicarbonate 150 mEq in dextrose 5 % 1,150 mL infusion (has no administration in time range)  calcium gluconate 1 g/ 50 mL sodium chloride IVPB (1,000 mg Intravenous New Bag/Given 01/05/22 1525)  lactated ringers bolus 1,000 mL (has no administration in time range)  0.9 %  sodium chloride infusion (has no administration in time range)  dextrose 50 % solution 50 mL (50 mLs Intravenous Given 01/05/22 1235)  sodium chloride 0.9 % bolus 1,000 mL (0 mLs Intravenous Stopped 01/05/22  1359)  sodium bicarbonate injection 100 mEq (100 mEq Intravenous Given 01/05/22 1250)  magnesium sulfate IVPB 2 g 50 mL (0 g Intravenous Stopped 01/05/22 1359)  sodium bicarbonate injection 100 mEq (100 mEq Intravenous Given 01/05/22 1513)    ED Course/ Medical Decision Making/ A&P Clinical Course as of 01/05/22 1529  Sun Jan 05, 2022  1311 Discussed case with on-call critical care team who will be down to evaluate the patient and put in orders for continued management for admission.  CMP still pending. [VB]    Clinical Course User Index [VB] Jan 07, 2022, MD                           Medical Decision Making [facility] is a 26 y.o. female.  With PMH of polysubstance abuse and history of CVA, anxiety brought in by EMS postcardiac arrest.  They were called by bystander for unresponsiveness.  They did endorse that she was using fentanyl and crack.  She was given 8 mg of intranasal Narcan followed by 2 IO Narcan.  Found to be in asystolic arrest.  She was coded for 20 minutes and given 2 doses of epinephrine with ROSC.  King airway was placed as well as right tibial IO.  EMS had noted that she had been hypoglycemic in the 30s given D10 infusion.  On arrival, patient minimally responsive to pain with intermittent agonal respirations with King airway in place.  Pupils were 3 and equal.  No evidence of head trauma.  Breath sounds rhonchorous  bilaterally.  Right IO in place.  Patient postcardiac arrest likely secondary to overdose but also consider acute metabolic abnormality such as severe hypoglycemia, acidosis 2/2 overdose.   EKG sinus rhythm with prolonged QTc 526.  No ischemic changes. No STEMI.  Chest x-ray obtained which I personally reviewed showed ET tube in correct place with no consolidation concerning for pneumonia, no pneumothorax, no pleural effusion.  CT head obtained with no evidence of ICH.  Point-of-care glucose 276 after being given D50 on arrival.  [facility] airway  exchanged for ET tube after etomidate and rocuronium administered.  She was started on IV fluids and IV propofol.  Her i-STAT VBG remarkable for acidosis pH 6.852 pCO2 55.6 likely metabolic acidosis . Given 1x bicarbonate.   Patient admitted pending further lab workup and evaluation to ICU team for continued management postcardiac arrest and management of severe mixed respiratory and metabolic acidosis.   Amount and/or Complexity of Data Reviewed Labs: ordered. Radiology: ordered.  Risk Prescription drug management. Decision regarding hospitalization.    Final Clinical Impression(s) / ED Diagnoses Final diagnoses:  Acidosis  Cardiac arrest [facility])  Overdose of undetermined intent, initial encounter    Rx / DC Orders ED Discharge Orders     None         Mardene Sayer, MD 01/05/22 1529    Mardene Sayer, MD 01/05/22 [phone]

## 2022-01-05 NOTE — Progress Notes (Signed)
An USGPIV (ultrasound guided PIV) has been placed for short-term vasopressor infusion. A correctly placed ivWatch must be used when administering Vasopressors. Should this treatment be needed beyond 72 hours, central line access should be obtained.  It will be the responsibility of the bedside nurse to follow best practice to prevent extravasations.   ?

## 2022-01-05 NOTE — Progress Notes (Signed)
RT x 2 unsuccessfully at Aline placement at this time even with the use of a doppler. RN at bedside to witness. CCM MD notified and made aware.

## 2022-01-05 NOTE — Progress Notes (Addendum)
eLink Physician-Brief Progress Note Patient Name: Atiya Yera DOB: 09/15/1995 MRN: [id number]   Date of Service  01/05/2022  HPI/Events of Note  Hyperglycemia on vent and not on tube feeds. Is on D5 LR at 125 cc/hour as well as on bicarb drip at 75 cc/hour, both in dextrose. Stop D5LR. Use LR. Has an ABG due at 930 pm. Recheck glucose at 10 pm  eICU Interventions  As above. D/w RN      Intervention Category Major Interventions: Electrolyte abnormality - evaluation and management;Hyperglycemia - active titration of insulin therapy  Oretha Milch 01/05/2022, 8:18 PM  Addendum at 10:20 pm/ Noted ABG and glucose also better after switching to LR. Continue  Addendum at 545 am  .Notified of ABG and BMP . Bicarb now 25. Bicarb drip running at 75 cc/hour. Ph is 7.55 Stop iV bicarb K being repleted  Recheck bmp and abg at 9 am to make sure stable

## 2022-01-05 NOTE — Progress Notes (Signed)
Pt transported on vent to CT and back to [facility] without complications. RT will monitor.

## 2022-01-05 NOTE — ED Notes (Signed)
Pt intubated, 22 @lip 

## 2022-01-05 NOTE — H&P (Addendum)
NAME:  Jane Watkins, MRN:  [id number], DOB:  [birth year], LOS: 0 ADMISSION DATE:  01/05/2022, CONSULTATION DATE:  11/26 REFERRING MD:  Elpidio Anis, CHIEF COMPLAINT:  cardiac arrest s/p unintentional overdose    History of Present Illness:  To ER via EMS. Known polysubstance abuse. Witnessed overdose crack cocaine and fentanyl. Found Pulseless and in asystole. Down-time not clear. No by-stander CPR. Time to ROSC estimated 20 minutes. No response to Narcan. Glucose 37 got Dextrose. Transferred to ER In ER:  Airway changed from [facility] to ETT Presenting exam: agonal respirations, no response to pain.  CT head ordered.  Labs: wbc 13, inr 1.5, glucose 276 s/p D50, vbg 6.85, 56, 70, 11 PCXR lungs clear. No infiltrate.  PCCM asked to admit   Pertinent  Medical History  Poly substance abuse, prior CVA w/ spastic hemiparesis-->improved w/ Botox but still has sig upper ext tight flexion, cognitive deficits,  anxiety, depression and PTSD getting outpt ketamine   Significant Hospital Events: Including procedures, antibiotic start and stop dates in addition to other pertinent events   11/26 admitted post arrest GCS 3. ~ 20 minutes down time (not including prior to EMS arrival) no by-stander CPR.   Interim History / Subjective:  Unresponsive   Objective   Blood pressure 128/71, pulse (Abnormal) 109, resp. rate (Abnormal) 32, height 5\' 6"  (1.676 m), SpO2 100 %.    Vent Mode: PRVC FiO2 (%):  [100 %] 100 % Set Rate:  [24 bmp] 24 bmp Vt Set:  [470 mL] 470 mL PEEP:  [5 cmH20] 5 cmH20  No intake or output data in the 24 hours ending 01/05/22 1405 There were no vitals filed for this visit.  Examination: General: 26 year old female unresponsive on vent  HENT: PERRL orally intubated  Lungs: diffuse rhonchi  Cardiovascular: RRR tachy  Abdomen: soft Has LLE IO Extremities: warm pulses palp Neuro: GCS 3 no cough or gag GU: due to void   Resolved Hospital Problem list   Cardiac arrest Assessment &  Plan:   Acute Toxic, Metabolic and 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 H/o prior CVA w/ spastic hemiparesis involving right side (improved w/ Botox) Plan Full vent support  Normothermia  EEG Euglycemia  F/u MRI brain at 72 hrs   Acute hypoxic respiratory failure w/ probable aspiration  Cxr currently clear Plan Sputum culture Unasyn (empiric) F/u abg  Full vent support VAP bundle  PAD protocol RASS goal 0   Severe metabolic acidosis s/p cardiac arrest Plan Ck Lactate MAP goal > 65 EEG r/o sz  Fluid and electrolyte imbalance: istat Na 114. Not sure of accuracy  Plan F/u blood chem Send urine studies, cort and TSH  Borderline coagulopathy Plan Am IR Best Practice (right click and "Reselect all SmartList Selections" daily)   Diet/type: NPO DVT prophylaxis: prophylactic heparin  GI prophylaxis: H2B Lines: N/A Foley:  Yes, and it is still needed Code Status:  full code Last date of multidisciplinary goals of care discussion [pending]  Labs   CBC: Recent Labs  Lab 01/05/22 1230 01/05/22 1247  WBC 13.0*  --   HGB 10.1* 10.2*  HCT 41.9 30.0*  MCV 127.4*  --   PLT 291  --     Basic Metabolic Panel: Recent Labs  Lab 01/05/22 1230 01/05/22 1247  NA PENDING 114*  K PENDING 3.9  CL PENDING  --   CO2 PENDING  --   GLUCOSE PENDING  --   BUN PENDING  --  CREATININE PENDING  --   CALCIUM PENDING  --    GFR: CrCl cannot be calculated (This lab value cannot be used to calculate CrCl because it is not a number: PENDING). Recent Labs  Lab 01/05/22 1230  WBC 13.0*    Liver Function Tests: Recent Labs  Lab 01/05/22 1230  AST PENDING  ALT PENDING  ALKPHOS PENDING  BILITOT PENDING  PROT PENDING  ALBUMIN PENDING   No results for input(s): "LIPASE", "AMYLASE" in the last 168 hours. No results for input(s): "AMMONIA" in the last 168 hours.  ABG    Component Value Date/Time   PHART 7.389  04/28/2016 0445   PCO2ART 41.8 04/28/2016 0445   PO2ART 104 04/28/2016 0445   HCO3 9.7 (L) 01/05/2022 1247   TCO2 11 (L) 01/05/2022 1247   ACIDBASEDEF 23.0 (H) 01/05/2022 1247   O2SAT 75 01/05/2022 1247     Coagulation Profile: Recent Labs  Lab 01/05/22 1230  INR 1.5*    Cardiac Enzymes: No results for input(s): "CKTOTAL", "CKMB", "CKMBINDEX", "TROPONINI" in the last 168 hours.  HbA1C: Hgb A1c MFr Bld  Date/Time Value Ref Range Status  11/05/2020 09:35 AM 5.1 4.6 - 6.5 % Final    Comment:    Glycemic Control Guidelines for People with Diabetes:Non Diabetic:  <6%Goal of Therapy: <7%Additional Action Suggested:  >8%   02/07/2019 02:21 PM 5.0 4.8 - 5.6 % Final    Comment:    (NOTE) Pre diabetes:          5.7%-6.4% Diabetes:              >6.4% Glycemic control for   <7.0% adults with diabetes     CBG: Recent Labs  Lab 01/05/22 1239  GLUCAP 276*    Review of Systems:   Not able   Past Medical History:  She,  has a past medical history of AKI (acute kidney injury) (HCC) (2018), Anxiety, Chlamydia, Daily headache, Depression, Drug overdose (04/2016), GERD (gastroesophageal reflux disease), IV drug abuse (HCC) (09/20/2017), Migraine, Opioid abuse (HCC), Overdose, and Stroke (HCC).   Surgical History:   Past Surgical History:  Procedure Laterality Date   APPENDECTOMY  04/2013   I & D EXTREMITY Left 09/20/2017   Procedure: IRRIGATION AND DEBRIDEMENT LEFT ARM;  Surgeon: Bradly Bienenstock, MD;  Location: [facility] OR;  Service: Orthopedics;  Laterality: Left;   TEE WITHOUT CARDIOVERSION N/A 02/10/2019   Procedure: TRANSESOPHAGEAL ECHOCARDIOGRAM (TEE);  Surgeon: Debbe Odea, MD;  Location: [facility] ORS;  Service: Cardiovascular;  Laterality: N/A;  Polysubstance abuser   TONGUE SURGERY  ~ 2007   "related to lisp"   TRACHEOSTOMY  04/2016   [name] 05/01/2016     Social History:   reports that she has been smoking cigarettes. She has a 5.00 pack-year smoking history. She has never  used smokeless tobacco. She reports current drug use. Drugs: IV, Heroin, Cocaine, and Marijuana. She reports that she does not drink alcohol.   Family History:  Her family history includes Anemia in her father; Healthy in her sister; Lung cancer in her maternal grandfather and paternal grandfather; Thyroid cancer in her maternal grandmother.   Allergies Allergies  Allergen Reactions   Pristiq [Desvenlafaxine Succinate Er] Rash   Desvenlafaxine Rash   Other Rash    Tide detergent  Tide detergent  Tide detergent    Sulfa Antibiotics Rash     Home Medications  Prior to Admission medications   Medication Sig Start Date End Date Taking? Authorizing Provider  acetaminophen (TYLENOL) 325 MG  tablet Take 2 tablets (650 mg total) by mouth every 4 (four) hours as needed for mild pain (or temp > 37.5 C (99.5 F)). 02/28/19   Angiulli, Mcarthur Rossetti, PA-C  aspirin EC 81 MG tablet Take 1 tablet (81 mg total) by mouth daily. Swallow whole. 06/04/20   Ihor Austin, NP  atorvastatin (LIPITOR) 20 MG tablet Take 1 tablet (20 mg total) by mouth daily at 6 PM. 06/04/20   Ihor Austin, NP  baclofen (LIORESAL) 10 MG tablet Take 1 tablet (10 mg total) by mouth 3 (three) times daily. 08/10/21   Raulkar, Drema Pry, MD  benzoyl peroxide 5 % external liquid Apply topically 2 (two) times daily.    [provider]  busPIRone (BUSPAR) 10 MG tablet TAKE 1 TABLET BY MOUTH THREE TIMES A DAY 11/16/19   Raulkar, Drema Pry, MD  esomeprazole (NEXIUM) 40 MG capsule Take 1 capsule (40 mg total) by mouth daily at 12 noon. 02/10/20   Raulkar, Drema Pry, MD  gabapentin (NEURONTIN) 300 MG capsule Take 1 capsule (300 mg total) by mouth at bedtime. 10/23/21   Raulkar, Drema Pry, MD  lamoTRIgine (LAMICTAL) 25 MG tablet Take by mouth. 06/02/21   [provider]  naloxone (NARCAN) nasal spray 4 mg/0.1 mL ADMINISTER A SINGLE SPRAY IN ONE NOSTRIL UPON SIGNS OF OPIOID OVERDOSE. REPEAT EVERY 2 - 3 MINUTES IF NO RESPONSE IN  ALTERNATING NOSTRIL TIL EMS ARRIVES. 10/30/20   [provider]  nicotine (NICODERM CQ - DOSED IN MG/24 HOURS) 21 mg/24hr patch Place 1 patch (21 mg total) onto the skin daily. 06/04/20   Ihor Austin, NP  propranolol (INDERAL) 10 MG tablet Take 1 tablet (10 mg total) by mouth daily as needed. 10/23/21   Raulkar, Drema Pry, MD  QUEtiapine (SEROQUEL) 50 MG tablet Take 1 tablet (50 mg total) by mouth at bedtime. 10/23/21   Raulkar, Drema Pry, MD  spironolactone (ALDACTONE) 50 MG tablet Take 1 tablet (50 mg total) by mouth 2 (two) times daily. 12/12/20   Raulkar, Drema Pry, MD  SUMAtriptan (IMITREX) 50 MG tablet Take 1 tablet by mouth every 2 hours as needed for migraine. May repeat in 2 hours if headache persists or recurs. 01/29/21   Raulkar, Drema Pry, MD  tiZANidine (ZANAFLEX) 4 MG tablet Take 1 tablet (4 mg total) by mouth 3 (three) times daily. 10/23/21   Horton Chin, MD  tretinoin (RETIN-A) 0.05 % cream As directed 06/07/19   [provider]     Critical care time: 34 min   Simonne Martinet ACNP-BC [facility] Pulmonary/Critical Care Pager # [phone] OR # [phone] if no answer

## 2022-01-06 ENCOUNTER — Inpatient Hospital Stay (HOSPITAL_COMMUNITY): Payer: Medicare HMO

## 2022-01-06 DIAGNOSIS — R4182 Altered mental status, unspecified: Secondary | ICD-10-CM

## 2022-01-06 DIAGNOSIS — I469 Cardiac arrest, cause unspecified: Secondary | ICD-10-CM

## 2022-01-06 LAB — COMPREHENSIVE METABOLIC PANEL
ALT: 1656 U/L — ABNORMAL HIGH (ref 0–44)
ALT: 1251 U/L — ABNORMAL HIGH (ref 0–44)
AST: 1598 U/L — ABNORMAL HIGH (ref 15–41)
AST: 742 U/L — ABNORMAL HIGH (ref 15–41)
Albumin: 3.1 g/dL — ABNORMAL LOW (ref 3.5–5.0)
Albumin: 3 g/dL — ABNORMAL LOW (ref 3.5–5.0)
Alkaline Phosphatase: 49 U/L (ref 38–126)
Alkaline Phosphatase: 48 U/L (ref 38–126)
Anion gap: 9 (ref 5–15)
Anion gap: 8 (ref 5–15)
BUN: 19 mg/dL (ref 6–20)
BUN: 13 mg/dL (ref 6–20)
CO2: 25 mmol/L (ref 22–32)
CO2: 24 mmol/L (ref 22–32)
Calcium: 8.2 mg/dL — ABNORMAL LOW (ref 8.9–10.3)
Calcium: 8.2 mg/dL — ABNORMAL LOW (ref 8.9–10.3)
Chloride: 105 mmol/L (ref 98–111)
Chloride: 108 mmol/L (ref 98–111)
Creatinine, Ser: 0.93 mg/dL (ref 0.44–1.00)
Creatinine, Ser: 0.7 mg/dL (ref 0.44–1.00)
GFR, Estimated: >60 mL/min (ref >60)
GFR, Estimated: >60 mL/min (ref >60)
Glucose, Bld: 142 mg/dL — ABNORMAL HIGH (ref 70–99)
Glucose, Bld: 76 mg/dL (ref 70–99)
Potassium: 3.2 mmol/L — ABNORMAL LOW (ref 3.5–5.1)
Potassium: 4.6 mmol/L (ref 3.5–5.1)
Sodium: 139 mmol/L (ref 135–145)
Sodium: 140 mmol/L (ref 135–145)
Total Bilirubin: 1.2 mg/dL (ref 0.3–1.2)
Total Bilirubin: 1.3 mg/dL — ABNORMAL HIGH (ref 0.3–1.2)
Total Protein: 5 g/dL — ABNORMAL LOW (ref 6.5–8.1)
Total Protein: 4.8 g/dL — ABNORMAL LOW (ref 6.5–8.1)

## 2022-01-06 LAB — GLUCOSE, CAPILLARY
Glucose-Capillary: 289 mg/dL — ABNORMAL HIGH (ref 70–99)
Glucose-Capillary: 120 mg/dL — ABNORMAL HIGH (ref 70–99)
Glucose-Capillary: 146 mg/dL — ABNORMAL HIGH (ref 70–99)
Glucose-Capillary: 99 mg/dL (ref 70–99)
Glucose-Capillary: 93 mg/dL (ref 70–99)
Glucose-Capillary: 81 mg/dL (ref 70–99)
Glucose-Capillary: 78 mg/dL (ref 70–99)

## 2022-01-06 LAB — POCT I-STAT 7, (LYTES, BLD GAS, ICA,H+H)
Acid-Base Excess: 6 mmol/L — ABNORMAL HIGH (ref 0.0–2.0)
Bicarbonate: 27.7 mmol/L (ref 20.0–28.0)
Calcium, Ion: 1.11 mmol/L — ABNORMAL LOW (ref 1.15–1.40)
HCT: 35 % — ABNORMAL LOW (ref 36.0–46.0)
Hemoglobin: 11.9 g/dL — ABNORMAL LOW (ref 12.0–15.0)
O2 Saturation: 100 %
Patient temperature: 38.2
Potassium: 3 mmol/L — ABNORMAL LOW (ref 3.5–5.1)
Sodium: 141 mmol/L (ref 135–145)
TCO2: 29 mmol/L (ref 22–32)
pCO2 arterial: 32.6 mmHg (ref 32–48)
pH, Arterial: 7.541 — ABNORMAL HIGH (ref 7.35–7.45)
pO2, Arterial: 177 mmHg — ABNORMAL HIGH (ref 83–108)

## 2022-01-06 LAB — CBC
HCT: 36.4 % (ref 36.0–46.0)
Hemoglobin: 12.1 g/dL (ref 12.0–15.0)
MCH: 29.5 pg (ref 26.0–34.0)
MCHC: 33.2 g/dL (ref 30.0–36.0)
MCV: 88.8 fL (ref 80.0–100.0)
Platelets: 241 10*3/uL (ref 150–400)
RBC: 4.1 MIL/uL (ref 3.87–5.11)
RDW: 13.1 % (ref 11.5–15.5)
WBC: 13.9 10*3/uL — ABNORMAL HIGH (ref 4.0–10.5)
nRBC: 0 % (ref 0.0–0.2)

## 2022-01-06 LAB — BASIC METABOLIC PANEL
Anion gap: 11 (ref 5–15)
BUN: 16 mg/dL (ref 6–20)
CO2: 25 mmol/L (ref 22–32)
Calcium: 8.4 mg/dL — ABNORMAL LOW (ref 8.9–10.3)
Chloride: 105 mmol/L (ref 98–111)
Creatinine, Ser: 0.78 mg/dL (ref 0.44–1.00)
GFR, Estimated: >60 mL/min (ref >60)
Glucose, Bld: 94 mg/dL (ref 70–99)
Potassium: 3.7 mmol/L (ref 3.5–5.1)
Sodium: 141 mmol/L (ref 135–145)

## 2022-01-06 LAB — URINE CULTURE: Culture: NO GROWTH

## 2022-01-06 LAB — LACTIC ACID, PLASMA: Lactic Acid, Venous: 3.5 mmol/L (ref 0.5–1.9)

## 2022-01-06 LAB — OSMOLALITY, URINE: Osmolality, Ur: 450 mOsm/kg (ref 300–900)

## 2022-01-06 LAB — PROCALCITONIN: Procalcitonin: 7.78 ng/mL

## 2022-01-06 MED ORDER — SODIUM CHLORIDE 0.9 % IV SOLN
3.0000 g | Freq: Three times a day (TID) | INTRAVENOUS | Status: AC
  Administered 2022-01-06 – 2022-01-11 (×15): 3 g via INTRAVENOUS
  Filled 2022-01-06 (×16): qty 8

## 2022-01-06 MED ORDER — ACETAMINOPHEN 160 MG/5ML PO SOLN: 650.0000 mg | Freq: Four times a day (QID) | ORAL | Status: DC | PRN

## 2022-01-06 MED ORDER — FAMOTIDINE 20 MG PO TABS
20.0000 mg | ORAL_TABLET | Freq: Two times a day (BID) | ORAL | Status: DC
  Administered 2022-01-06: 20 mg
  Filled 2022-01-06: qty 1

## 2022-01-06 MED ORDER — POTASSIUM CHLORIDE 20 MEQ PO PACK
20.0000 meq | PACK | ORAL | Status: AC
  Administered 2022-01-06 (×2): 20 meq
  Filled 2022-01-06 (×2): qty 1

## 2022-01-06 MED ORDER — POTASSIUM CHLORIDE 10 MEQ/100ML IV SOLN
10.0000 meq | INTRAVENOUS | Status: AC
  Administered 2022-01-06 (×4): 10 meq via INTRAVENOUS
  Filled 2022-01-06 (×4): qty 100

## 2022-01-06 MED ORDER — ENOXAPARIN SODIUM 40 MG/0.4ML IJ SOSY
40.0000 mg | PREFILLED_SYRINGE | INTRAMUSCULAR | Status: DC
  Administered 2022-01-06 – 2022-01-13 (×6): 40 mg via SUBCUTANEOUS
  Filled 2022-01-06 (×9): qty 0.4

## 2022-01-06 MED ORDER — ACETAMINOPHEN 650 MG RE SUPP: 650.0000 mg | Freq: Four times a day (QID) | RECTAL | Status: DC | PRN

## 2022-01-06 MED ORDER — ACETAMINOPHEN 325 MG PO TABS
650.0000 mg | ORAL_TABLET | Freq: Four times a day (QID) | ORAL | Status: DC | PRN
  Administered 2022-01-08 – 2022-01-11 (×5): 650 mg via ORAL
  Filled 2022-01-06 (×5): qty 2

## 2022-01-06 MED ORDER — CHLORHEXIDINE GLUCONATE CLOTH 2 % EX PADS
6.0000 | MEDICATED_PAD | Freq: Every day | CUTANEOUS | Status: DC
  Administered 2022-01-06 – 2022-01-17 (×7): 6 via TOPICAL

## 2022-01-06 NOTE — Progress Notes (Signed)
[facility] ADULT ICU REPLACEMENT PROTOCOL   The patient does apply for the [facility] Adult ICU Electrolyte Replacment Protocol based on the criteria listed below:   1.Exclusion criteria: TCTS, ECMO, Dialysis, and Myasthenia Gravis patients 2. Is GFR >/= 30 ml/min? Yes.    Patient's GFR today is >60 3. Is SCr </= 2? Yes.   Patient's SCr is 0.93 mg/dL 4. Did SCr increase >/= 0.5 in 24 hours? No. 5.Pt's weight >40kg  Yes.   6. Abnormal electrolyte(s): K+ 3.2  7. Electrolytes replaced per protocol 8.  Call MD STAT for K+ </= 2.5, Phos </= 1, or Mag </= 1 Physician:  Dr. Stark Bray, Lilia Argue 01/06/2022 5:39 AM

## 2022-01-06 NOTE — Procedures (Signed)
Extubation Procedure Note  Patient Details:   Name: Jane Watkins DOB: [birth year] MRN: [id number]   Airway Documentation:    Vent end date: 01/06/22 Vent end time: 1345   Evaluation  O2 sats: stable throughout Complications: No apparent complications Patient did tolerate procedure well. Bilateral Breath Sounds: Clear   Yes  Pt extubated per physician order. Pt with positive cuff leak and suctioned via ETT/orally prior. Upon extubation pt able to softly speak, no cough at this time and no stridor heard. Pt placed on 3L nasal cannula. RT will continue to monitor and be available as needed.   Derinda Late 01/06/2022, 1:46 PM

## 2022-01-06 NOTE — Progress Notes (Signed)
Pt drowsy and not alert enough for [facility] swallow eval. PM nurse to evaluate later for alertness and conduct swallow eval.

## 2022-01-06 NOTE — Procedures (Signed)
Patient Name: Jane Watkins  MRN: [id number]  Epilepsy Attending: Charlsie Quest  Referring Physician/Provider: Simonne Martinet, NP  Date: 01/05/2022 Duration: 21.50 mins  Patient history: 26 year old female with altered mental status.  EEG to evaluate for seizure.  Level of alertness: lethargic   AEDs during EEG study: Lamotrigine, gabapentin  Technical aspects: This EEG study was done with scalp electrodes positioned according to the 10-20 International system of electrode placement. Electrical activity was reviewed with band pass filter of 1-70Hz , sensitivity of 7 uV/mm, display speed of 44mm/sec with a 60Hz  notched filter applied as appropriate. EEG data were recorded continuously and digitally stored.  Video monitoring was available and reviewed as appropriate.  Description: EEG showed continuous generalized 3 to 6 Hz theta-delta slowing. Hyperventilation and photic stimulation were not performed.     ABNORMALITY - Continuous slow, generalized  IMPRESSION: This study is suggestive of moderate to severe diffuse encephalopathy, nonspecific etiology. No seizures or epileptiform discharges were seen throughout the recording.  Cindy Brindisi 

## 2022-01-06 NOTE — Progress Notes (Signed)
NAME:  Jane Watkins, MRN:  [id number], DOB:  12/17/1995, LOS: 1 ADMISSION DATE:  01/05/2022, CONSULTATION DATE:  11/26 REFERRING MD:  Elpidio Anis, CHIEF COMPLAINT:  cardiac arrest s/p unintentional overdose    History of Present Illness:  To ER via EMS. Known polysubstance abuse. Witnessed overdose crack cocaine and fentanyl. Found Pulseless and in asystole. Down-time not clear. No by-stander CPR. Time to ROSC estimated 20 minutes. No response to Narcan. Glucose 37 got Dextrose. Transferred to ER In ER:  Airway changed from [facility] to ETT Presenting exam: agonal respirations, no response to pain.  CT head ordered.  Labs: wbc 13, inr 1.5, glucose 276 s/p D50, vbg 6.85, 56, 70, 11 PCXR lungs clear. No infiltrate.  PCCM asked to admit   Pertinent  Medical History  Poly substance abuse, prior CVA w/ spastic hemiparesis-->improved w/ Botox but still has sig upper ext tight flexion, cognitive deficits,  anxiety, depression and PTSD getting outpt ketamine   Significant Hospital Events: Including procedures, antibiotic start and stop dates in addition to other pertinent events   11/26 admitted post arrest GCS 3. ~ 20 minutes down time (not including prior to EMS arrival) no by-stander CPR.   Interim History / Subjective:   More awake today.  Does not answer questions.  Has remained hemodynamically stable On ventilator.  Objective   Blood pressure 96/70, pulse 93, temperature 100.2 F (37.9 C), resp. rate (!) 24, height 5\' 6"  (1.676 m), weight 58.9 kg, SpO2 100 %.    Vent Mode: PRVC FiO2 (%):  [35 %-100 %] 35 % Set Rate:  [24 bmp-30 bmp] 24 bmp Vt Set:  [470 mL] 470 mL PEEP:  [5 cmH20-10 cmH20] 5 cmH20 Plateau Pressure:  [18 cmH20-24 cmH20] 20 cmH20   Intake/Output Summary (Last 24 hours) at 01/06/2022 01/08/2022 Last data filed at 01/06/2022 0700 Gross per 24 hour  Intake 2745.67 ml  Output 2100 ml  Net 645.67 ml   Filed Weights   01/05/22 1600 01/06/22 0402  Weight: 58.2 kg 58.9 kg     Examination: Gen:      No acute distress HEENT:  EOMI, sclera anicteric Neck:     No masses; no thyromegaly Lungs:    Clear to auscultation bilaterally; normal respiratory effort CV:         Regular rate and rhythm; no murmurs Abd:      + bowel sounds; soft, non-tender; no palpable masses, no distension Ext:    No edema; adequate peripheral perfusion Skin:      Warm and dry; no rash Neuro: Awake, tracks with eyes,, moves all extremities.  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 H/o prior CVA w/ spastic hemiparesis involving right side (improved w/ Botox) Plan More awake today.  Does not appear to have significant neurologic deficit Will try spontaneous breathing trials Echo reviewed with normal heart function  Acute hypoxic respiratory failure w/ probable aspiration  Developing right mid and lower lobe infiltrate. Plan Follow cultures Continue Unasyn  Severe metabolic acidosis s/p cardiac arrest Plan Lactic acid is improving  Hypokalemia Replete electrolytes  Elevated LFTs Likely shock liver after cardiac arrest Follow labs  Borderline coagulopathy Plan Repeat labs  Best Practice (right click and "Reselect all  SmartList Selections" daily)   Diet/type: NPO DVT prophylaxis: prophylactic heparin  GI prophylaxis: H2B Lines: N/A Foley:  Yes, and it is still needed Code Status:  full code Last date of multidisciplinary goals of care discussion [pending]  Critical care time:    The patient is critically ill with multiple organ system failure and requires high complexity decision making for assessment and support, frequent evaluation and  titration of therapies, advanced monitoring, review of radiographic studies and interpretation of complex data.   Critical Care Time devoted to patient care services, exclusive of separately billable procedures, described in this note is 35 minutes.   Chilton Greathouse MD [place] [facility] See Amion for pager  If no response to pager , please call [phone] until 7pm After 7:00 pm call Elink  [phone] 01/06/2022, 8:21 AM

## 2022-01-07 ENCOUNTER — Inpatient Hospital Stay (HOSPITAL_COMMUNITY): Payer: Medicare HMO

## 2022-01-07 DIAGNOSIS — I469 Cardiac arrest, cause unspecified: Secondary | ICD-10-CM | POA: Diagnosis not present

## 2022-01-07 LAB — GLUCOSE, CAPILLARY
Glucose-Capillary: 67 mg/dL — ABNORMAL LOW (ref 70–99)
Glucose-Capillary: 72 mg/dL (ref 70–99)
Glucose-Capillary: 124 mg/dL — ABNORMAL HIGH (ref 70–99)
Glucose-Capillary: 77 mg/dL (ref 70–99)
Glucose-Capillary: 70 mg/dL (ref 70–99)
Glucose-Capillary: 66 mg/dL — ABNORMAL LOW (ref 70–99)
Glucose-Capillary: 164 mg/dL — ABNORMAL HIGH (ref 70–99)
Glucose-Capillary: 96 mg/dL (ref 70–99)
Glucose-Capillary: 69 mg/dL — ABNORMAL LOW (ref 70–99)
Glucose-Capillary: 79 mg/dL (ref 70–99)
Glucose-Capillary: 77 mg/dL (ref 70–99)

## 2022-01-07 LAB — BASIC METABOLIC PANEL
Anion gap: 6 (ref 5–15)
BUN: 9 mg/dL (ref 6–20)
CO2: 22 mmol/L (ref 22–32)
Calcium: 8.1 mg/dL — ABNORMAL LOW (ref 8.9–10.3)
Chloride: 110 mmol/L (ref 98–111)
Creatinine, Ser: 0.62 mg/dL (ref 0.44–1.00)
GFR, Estimated: >60 mL/min (ref >60)
Glucose, Bld: 78 mg/dL (ref 70–99)
Potassium: 4 mmol/L (ref 3.5–5.1)
Sodium: 138 mmol/L (ref 135–145)

## 2022-01-07 LAB — DIC (DISSEMINATED INTRAVASCULAR COAGULATION)PANEL
D-Dimer, Quant: 2.15 ug/mL-FEU — ABNORMAL HIGH (ref 0.00–0.50)
Fibrinogen: 338 mg/dL (ref 210–475)
INR: 1.5 — ABNORMAL HIGH (ref 0.8–1.2)
Prothrombin Time: 18 seconds — ABNORMAL HIGH (ref 11.4–15.2)
aPTT: 28 seconds (ref 24–36)

## 2022-01-07 LAB — SAVE SMEAR(SSMR), FOR PROVIDER SLIDE REVIEW: Smear Review: NONE SEEN

## 2022-01-07 LAB — HEPATIC FUNCTION PANEL
ALT: 1021 U/L — ABNORMAL HIGH (ref 0–44)
AST: 385 U/L — ABNORMAL HIGH (ref 15–41)
Albumin: 3 g/dL — ABNORMAL LOW (ref 3.5–5.0)
Alkaline Phosphatase: 54 U/L (ref 38–126)
Bilirubin, Direct: 0.3 mg/dL — ABNORMAL HIGH (ref 0.0–0.2)
Indirect Bilirubin: 1.1 mg/dL — ABNORMAL HIGH (ref 0.3–0.9)
Total Bilirubin: 1.4 mg/dL — ABNORMAL HIGH (ref 0.3–1.2)
Total Protein: 4.9 g/dL — ABNORMAL LOW (ref 6.5–8.1)

## 2022-01-07 LAB — CBC
HCT: 33.7 % — ABNORMAL LOW (ref 36.0–46.0)
Hemoglobin: 10.7 g/dL — ABNORMAL LOW (ref 12.0–15.0)
MCH: 29.1 pg (ref 26.0–34.0)
MCHC: 31.8 g/dL (ref 30.0–36.0)
MCV: 91.6 fL (ref 80.0–100.0)
Platelets: 212 10*3/uL (ref 150–400)
RBC: 3.68 MIL/uL — ABNORMAL LOW (ref 3.87–5.11)
RDW: 13.1 % (ref 11.5–15.5)
WBC: 10 10*3/uL (ref 4.0–10.5)
nRBC: 0 % (ref 0.0–0.2)

## 2022-01-07 LAB — PLATELET COUNT: Platelets: 205 10*3/uL (ref 150–400)

## 2022-01-07 LAB — SALICYLATE LEVEL: Salicylate Lvl: <7 mg/dL — ABNORMAL LOW (ref 7.0–30.0)

## 2022-01-07 LAB — ACETAMINOPHEN LEVEL: Acetaminophen (Tylenol), Serum: <10 ug/mL — ABNORMAL LOW (ref 10–30)

## 2022-01-07 LAB — LACTIC ACID, PLASMA: Lactic Acid, Venous: 1.1 mmol/L (ref 0.5–1.9)

## 2022-01-07 MED ORDER — DEXTROSE 50 % IV SOLN
12.5000 g | INTRAVENOUS | Status: AC
  Administered 2022-01-07: 12.5 g via INTRAVENOUS

## 2022-01-07 MED ORDER — GABAPENTIN 300 MG PO CAPS
300.0000 mg | ORAL_CAPSULE | Freq: Every day | ORAL | Status: DC
  Administered 2022-01-07 – 2022-01-18 (×12): 300 mg via ORAL
  Filled 2022-01-07 (×12): qty 1

## 2022-01-07 MED ORDER — LAMOTRIGINE 25 MG PO TABS
25.0000 mg | ORAL_TABLET | Freq: Two times a day (BID) | ORAL | Status: DC
  Administered 2022-01-07 – 2022-01-19 (×25): 25 mg via ORAL
  Filled 2022-01-07 (×26): qty 1

## 2022-01-07 MED ORDER — DEXTROSE 50 % IV SOLN
INTRAVENOUS | Status: AC
  Filled 2022-01-07: qty 50

## 2022-01-07 MED ORDER — POLYETHYLENE GLYCOL 3350 17 G PO PACK
17.0000 g | PACK | Freq: Every day | ORAL | Status: DC
  Administered 2022-01-08 – 2022-01-10 (×2): 17 g via ORAL
  Filled 2022-01-07 (×8): qty 1

## 2022-01-07 MED ORDER — OXCARBAZEPINE 150 MG PO TABS
150.0000 mg | ORAL_TABLET | Freq: Two times a day (BID) | ORAL | Status: DC
  Administered 2022-01-07 – 2022-01-19 (×25): 150 mg via ORAL
  Filled 2022-01-07 (×27): qty 1

## 2022-01-07 MED ORDER — DEXTROSE IN LACTATED RINGERS 5 % IV SOLN: INTRAVENOUS | Status: DC

## 2022-01-07 MED ORDER — DOCUSATE SODIUM 100 MG PO CAPS
100.0000 mg | ORAL_CAPSULE | Freq: Two times a day (BID) | ORAL | Status: DC
  Administered 2022-01-07 – 2022-01-19 (×21): 100 mg via ORAL
  Filled 2022-01-07 (×23): qty 1

## 2022-01-07 NOTE — Progress Notes (Signed)
eLink Physician-Brief Progress Note Patient Name: Jane Watkins DOB: 08/17/1995 MRN: [id number]   Date of Service  01/07/2022  HPI/Events of Note  CBG 60s to 70s and failed swallow test, asking for cont dextrose until ST test her today  eICU Interventions  Ordered to start D5 LR at 10/hr Bedside rounding team to assess if patient may need NGT     Intervention Category Intermediate Interventions: Other:  Darl Pikes 01/07/2022, 6:53 AM

## 2022-01-07 NOTE — Progress Notes (Signed)
NAME:  Jane Watkins, MRN:  [id number], DOB:  Sep 16, 1995, LOS: 2 ADMISSION DATE:  01/05/2022, CONSULTATION DATE:  11/26 REFERRING MD:  Elpidio Anis, CHIEF COMPLAINT:  cardiac arrest s/p unintentional overdose    History of Present Illness:  To ER via EMS. Known polysubstance abuse. Witnessed overdose crack cocaine and fentanyl. Found Pulseless and in asystole. Down-time not clear. No by-stander CPR. Time to ROSC estimated 20 minutes. No response to Narcan. Glucose 37 got Dextrose. Transferred to ER In ER:  Airway changed from [facility] to ETT Presenting exam: agonal respirations, no response to pain.  CT head ordered.  Labs: wbc 13, inr 1.5, glucose 276 s/p D50, vbg 6.85, 56, 70, 11 PCXR lungs clear. No infiltrate.  PCCM asked to admit   Pertinent  Medical History  Poly substance abuse, prior CVA w/ spastic hemiparesis-->improved w/ Botox but still has sig upper ext tight flexion, cognitive deficits,  anxiety, depression and PTSD getting outpt ketamine   Significant Hospital Events: Including procedures, antibiotic start and stop dates in addition to other pertinent events   11/26 admitted post arrest GCS 3. ~ 20 minutes down time (not including prior to EMS arrival) no by-stander CPR.   Interim History / Subjective:   Extubated yesterday.  Remains somnolent but arousable Failed weaning trials Started on D5 LR for hypoglycemia.  Objective   Blood pressure 114/80, pulse (!) 102, temperature 98.4 F (36.9 C), temperature source Oral, resp. rate 18, height 5\' 6"  (1.676 m), weight 62 kg, SpO2 98 %.    Vent Mode: CPAP;PCV FiO2 (%):  [35 %] 35 % PEEP:  [5 cmH20] 5 cmH20   Intake/Output Summary (Last 24 hours) at 01/07/2022 0859 Last data filed at 01/07/2022 0700 Gross per 24 hour  Intake 3533.37 ml  Output 1850 ml  Net 1683.37 ml   Filed Weights   01/05/22 1600 01/06/22 0402 01/07/22 0500  Weight: 58.2 kg 58.9 kg 62 kg   Examination: Gen:      No acute distress HEENT:  EOMI, sclera  anicteric Neck:     No masses; no thyromegaly Lungs:    Clear to auscultation bilaterally; normal respiratory effort CV:         Regular rate and rhythm; no murmurs Abd:      + bowel sounds; soft, non-tender; no palpable masses, no distension Ext:    No edema; adequate peripheral perfusion Skin:      Warm and dry; no rash Neuro: Somnolent  Labs/imaging reviewed Creatinine 0.62, AST 742, ALT 1251   Resolved Hospital Problem list   Cardiac arrest Assessment & Plan:   Acute Toxic, Metabolic and 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 H/o prior CVA w/ spastic hemiparesis involving right side (improved w/ Botox) Plan Stable post extubation Echo reviewed with normal heart function  Acute hypoxic respiratory failure w/ probable aspiration  Developing right mid and lower lobe infiltrate. Plan Follow cultures Continue Unasyn  Severe metabolic acidosis s/p cardiac arrest Plan Lactic acid is improving. Recheck labs  Elevated LFTs Likely shock liver after cardiac arrest Follow labs  Borderline coagulopathy Plan Repeat labs  Hypoglycemia Dextrose drip until she is able to take p.o.  May be moved out of the ICU and to hospitalist service if follow-up hepatic panel and coags are normal.  Best Practice (right click and "Reselect all SmartList Selections" daily)   Diet/type: NPO DVT prophylaxis: prophylactic heparin  GI prophylaxis: H2B Lines: N/A Foley:  Yes, and it is still needed  Code Status:  full code Last date of multidisciplinary goals of care discussion [pending]  Critical care time: NA   Chilton Greathouse MD [place] [facility] See Amion for pager  If no response to pager , please call [phone] until 7pm After 7:00 pm call Elink  [phone] 01/07/2022, 8:59 AM

## 2022-01-07 NOTE — Evaluation (Signed)
Clinical/Bedside Swallow Evaluation Patient Details  Name: Jane Watkins MRN: [id number] Date of Birth: 07-15-1995  Today's Date: 01/07/2022 Time: SLP Start Time (ACUTE ONLY): 0910 SLP Stop Time (ACUTE ONLY): 0925 SLP Time Calculation (min) (ACUTE ONLY): 15 min  Past Medical History:  Past Medical History:  Diagnosis Date   AKI (acute kidney injury) (HCC) 2018   "from overdose"   Anxiety    Chlamydia    Daily headache    Depression    Drug overdose 04/2016   [name] 05/01/2016   GERD (gastroesophageal reflux disease)    "when I was younger; gone now" (09/21/2017)   IV drug abuse (HCC) 09/20/2017   Migraine    "q couple weeks" (09/21/2017)   Opioid abuse (HCC)    Overdose    Stroke [facility])    Past Surgical History:  Past Surgical History:  Procedure Laterality Date   APPENDECTOMY  04/2013   I & D EXTREMITY Left 09/20/2017   Procedure: IRRIGATION AND DEBRIDEMENT LEFT ARM;  Surgeon: Bradly Bienenstock, MD;  Location: [facility] OR;  Service: Orthopedics;  Laterality: Left;   TEE WITHOUT CARDIOVERSION N/A 02/10/2019   Procedure: TRANSESOPHAGEAL ECHOCARDIOGRAM (TEE);  Surgeon: Debbe Odea, MD;  Location: [facility] ORS;  Service: Cardiovascular;  Laterality: N/A;  Polysubstance abuser   TONGUE SURGERY  ~ 2007   "related to lisp"   TRACHEOSTOMY  04/2016   /notes 05/01/2016   HPI:  Patient is a 26 y.o. 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 She presented to the ED on 01/05/22 with witnessed overdose of crack cocaine and fentanyl. She was found pulseless and in asystole; downtime unclear, no bystander CPR, time to ROSC estimated at 20 minutes, no response to Narcan. She was changed from [place] to ETT for airway. CT head ordered but did not show any acute changes.  PCXR showed lungs to be clear, no infiltrate. She was extubated to supplemental oxygen via nasal cannula on 01/06/22. She  failed [facility] with nursing and is NPO awaiting SLP evaluation.    Assessment / Plan / Recommendation  Clinical Impression  Patient presenting with clinical s/s of dysphagia as per this bedside swallow evaluation. She exhibited immediate, explosive cough after small cup sip of thin liquids (water) but tolerated puree solids (applesauce) with minimal oral delays. Patient has a h/o CVA as well as h/o prior hospitalizations requiring intubations and has been seen by SLP for dysphagia during those prior admissions. Her current state of somnolence and h/o dysphagia and CVA puts her at high risk for aspiration. SLP recommending continue with NPO status and will proceed with MBS today. SLP Visit Diagnosis: Dysphagia, unspecified (R13.10)    Aspiration Risk  Moderate aspiration risk    Diet Recommendation NPO   Medication Administration: Via alternative means    Other  Recommendations Oral Care Recommendations: Oral care BID;Staff/trained caregiver to provide oral care    Recommendations for follow up therapy are one component of a multi-disciplinary discharge planning process, led by the attending physician.  Recommendations may be updated based on patient status, additional functional criteria and insurance authorization.  Follow up Recommendations Follow physician's recommendations for discharge plan and follow up therapies      Assistance Recommended at Discharge    Functional Status Assessment Patient has had a recent decline in their functional status and demonstrates the ability to make significant improvements in function in a reasonable and predictable amount of time.  Frequency and Duration min 2x/week  2 weeks       Prognosis Prognosis for Safe Diet Advancement: Good Barriers to Reach Goals: Cognitive deficits      Swallow Study   General Date of Onset: 01/05/22 HPI: Patient is a 26 y.o. 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 She presented to the ED on 01/05/22 with witnessed overdose of crack cocaine and fentanyl. She was found pulseless and in asystole; downtime unclear, no bystander CPR, time to ROSC estimated at 20 minutes, no response to Narcan. She was changed from [place] to ETT for airway. CT head ordered but did not show any acute changes.  PCXR showed lungs to be clear, no infiltrate. She was extubated to supplemental oxygen via nasal cannula on 01/06/22. She failed [facility] with nursing and is NPO awaiting SLP evaluation. Type of Study: Bedside Swallow Evaluation Previous Swallow Assessment: during previous admission in 2020 Diet Prior to this Study: NPO Temperature Spikes Noted: No Respiratory Status: Nasal cannula History of Recent Intubation: Yes Length of Intubations (days): 2 days Date extubated: 01/06/22 Behavior/Cognition: Lethargic/Drowsy;Cooperative;Requires cueing Oral Cavity Assessment: Dry Oral Care Completed by SLP: Yes Oral Cavity - Dentition: Adequate natural dentition Self-Feeding Abilities: Able to feed self;Needs set up Patient Positioning: Upright in bed Baseline Vocal Quality: Low vocal intensity Volitional Cough: Weak Volitional Swallow: Able to elicit    Oral/Motor/Sensory Function Overall Oral Motor/Sensory Function: Generalized oral weakness Facial ROM: Within Functional Limits Facial Symmetry: Within Functional Limits Facial Strength: Within Functional Limits Lingual ROM: Within Functional Limits Lingual Symmetry: Within Functional Limits Lingual Strength: Within Functional Limits   Ice Chips     Thin Liquid Thin Liquid: Impaired Presentation: Cup Pharyngeal  Phase Impairments: Suspected delayed Swallow;Cough - Immediate    Nectar Thick     Honey Thick     Puree Puree: Impaired Oral Phase Functional Implications: Prolonged oral transit Pharyngeal Phase Impairments: Suspected delayed Swallow   Solid      Solid: Not tested      Angela Nevin, MA, CCC-SLP Speech Therapy

## 2022-01-07 NOTE — Progress Notes (Signed)
Modified Barium Swallow Progress Note  Patient Details  Name: Jane Watkins MRN: [id number] Date of Birth: 19-Sep-1995  Today's Date: 01/07/2022  Modified Barium Swallow completed.  Full report located under Chart Review in the Imaging Section.  Brief recommendations include the following:  Clinical Impression  Patient presents with a mild oropharyngeal dysphagia as per this MBS. During oral phase, she exhibited mildly delayed mastication with mechanical soft solids but with full clearance of oral cavity post swallow. During pharyngeal phase of swallow, patient exhibited swallow initiation delays at level of vallecular sinus with puree, mechanical soft, nectar and thin via cup sips and swallow initiation delay at level of pyriform sinus with thin liquids via straw sips. No significant pharyngeal residuals remained post initial swallows with any of the tested consistencies. Trace aspiration occured with thin liquids via straw sip, resulting in immediate cough response from patient. She also exhibited trace aspiration which was sensed and occuring during the swallow with cup sip of thin liquids towards end of study. No aspiration or penetration observed with nectar thick liquids. 56mm barium tablet transited through pharynx and UES without difficulty; esophageal sweep did not reveal any stasis or concerns for dysmotility. SLP recommending initiate PO diet of Dys 3 (mechanical soft) solids and nectar thick liquids and allow patient to have plain/thin water in between meals.   Swallow Evaluation Recommendations       SLP Diet Recommendations: Dysphagia 3 (Mech soft) solids;Nectar thick liquid   Liquid Administration via: Cup;No straw   Medication Administration: Whole meds with puree   Supervision: Patient able to self feed;Full supervision/cueing for compensatory strategies   Compensations: Minimize environmental distractions;Slow rate;Small sips/bites   Postural Changes: Seated upright at 90  degrees   Oral Care Recommendations: Oral care BID        Angela Nevin, MA, CCC-SLP Speech Therapy

## 2022-01-07 NOTE — Progress Notes (Signed)
Hypoglycemic Event  CBG: 67  Treatment: D50 25 mL (12.5 gm)  Symptoms: None  Follow-up CBG: Time:0234 CBG Result:124  Possible Reasons for Event: Unknown     Jane Watkins N Orman Matsumura

## 2022-01-08 ENCOUNTER — Inpatient Hospital Stay (HOSPITAL_COMMUNITY): Payer: Medicare HMO

## 2022-01-08 DIAGNOSIS — I469 Cardiac arrest, cause unspecified: Secondary | ICD-10-CM | POA: Diagnosis not present

## 2022-01-08 LAB — CBC
HCT: 35.6 % — ABNORMAL LOW (ref 36.0–46.0)
Hemoglobin: 12 g/dL (ref 12.0–15.0)
MCH: 30 pg (ref 26.0–34.0)
MCHC: 33.7 g/dL (ref 30.0–36.0)
MCV: 89 fL (ref 80.0–100.0)
Platelets: 220 10*3/uL (ref 150–400)
RBC: 4 MIL/uL (ref 3.87–5.11)
RDW: 12.8 % (ref 11.5–15.5)
WBC: 7.4 10*3/uL (ref 4.0–10.5)
nRBC: 0 % (ref 0.0–0.2)

## 2022-01-08 LAB — GLUCOSE, CAPILLARY
Glucose-Capillary: 168 mg/dL — ABNORMAL HIGH (ref 70–99)
Glucose-Capillary: 82 mg/dL (ref 70–99)
Glucose-Capillary: 85 mg/dL (ref 70–99)
Glucose-Capillary: 86 mg/dL (ref 70–99)
Glucose-Capillary: 94 mg/dL (ref 70–99)
Glucose-Capillary: 96 mg/dL (ref 70–99)

## 2022-01-08 LAB — PROTIME-INR
INR: 1.2 (ref 0.8–1.2)
Prothrombin Time: 15.2 seconds (ref 11.4–15.2)

## 2022-01-08 LAB — COMPREHENSIVE METABOLIC PANEL
ALT: 781 U/L — ABNORMAL HIGH (ref 0–44)
AST: 132 U/L — ABNORMAL HIGH (ref 15–41)
Albumin: 3.2 g/dL — ABNORMAL LOW (ref 3.5–5.0)
Alkaline Phosphatase: 55 U/L (ref 38–126)
Anion gap: 8 (ref 5–15)
BUN: <5 mg/dL — ABNORMAL LOW (ref 6–20)
CO2: 22 mmol/L (ref 22–32)
Calcium: 8.2 mg/dL — ABNORMAL LOW (ref 8.9–10.3)
Chloride: 108 mmol/L (ref 98–111)
Creatinine, Ser: 0.63 mg/dL (ref 0.44–1.00)
GFR, Estimated: >60 mL/min (ref >60)
Glucose, Bld: 93 mg/dL (ref 70–99)
Potassium: 3.3 mmol/L — ABNORMAL LOW (ref 3.5–5.1)
Sodium: 138 mmol/L (ref 135–145)
Total Bilirubin: 1.4 mg/dL — ABNORMAL HIGH (ref 0.3–1.2)
Total Protein: 5.7 g/dL — ABNORMAL LOW (ref 6.5–8.1)

## 2022-01-08 MED ORDER — MIRTAZAPINE 15 MG PO TBDP
15.0000 mg | ORAL_TABLET | Freq: Every day | ORAL | Status: DC
  Administered 2022-01-08 – 2022-01-18 (×11): 15 mg via ORAL
  Filled 2022-01-08 (×12): qty 1

## 2022-01-08 MED ORDER — MENTHOL 3 MG MT LOZG
1.0000 | LOZENGE | OROMUCOSAL | Status: DC | PRN
  Administered 2022-01-08 (×3): 3 mg via ORAL
  Filled 2022-01-08: qty 9

## 2022-01-08 MED ORDER — BUSPIRONE HCL 10 MG PO TABS
10.0000 mg | ORAL_TABLET | Freq: Three times a day (TID) | ORAL | Status: DC
  Administered 2022-01-08 – 2022-01-19 (×34): 10 mg via ORAL
  Filled 2022-01-08 (×34): qty 1

## 2022-01-08 MED ORDER — BACLOFEN 10 MG PO TABS
10.0000 mg | ORAL_TABLET | Freq: Three times a day (TID) | ORAL | Status: DC
  Administered 2022-01-08 – 2022-01-19 (×34): 10 mg via ORAL
  Filled 2022-01-08 (×34): qty 1

## 2022-01-08 MED ORDER — NICOTINE 14 MG/24HR TD PT24
14.0000 mg | MEDICATED_PATCH | Freq: Every day | TRANSDERMAL | Status: DC
  Administered 2022-01-08 – 2022-01-19 (×11): 14 mg via TRANSDERMAL
  Filled 2022-01-08 (×13): qty 1

## 2022-01-08 MED ORDER — QUETIAPINE FUMARATE 25 MG PO TABS
50.0000 mg | ORAL_TABLET | Freq: Every day | ORAL | Status: DC
  Administered 2022-01-08 – 2022-01-15 (×8): 50 mg via ORAL
  Filled 2022-01-08 (×8): qty 2

## 2022-01-08 MED ORDER — POTASSIUM CHLORIDE CRYS ER 20 MEQ PO TBCR
40.0000 meq | EXTENDED_RELEASE_TABLET | Freq: Once | ORAL | Status: AC
  Administered 2022-01-08: 40 meq via ORAL
  Filled 2022-01-08: qty 2

## 2022-01-08 NOTE — Progress Notes (Signed)
SLP Cancellation Note  Patient Details Name: Jane Watkins MRN: [id number] DOB: [birth year]   Cancelled treatment:       Reason Eval/Treat Not Completed: Patient's level of consciousness> Patient asleep and did not arouse. She has reportedly been impulsive when awake. SLP will f/u to ensure diet toleration and readiness to upgrade beyond Dys 3 (mechanical soft) solids and nectar thick liquids.  Angela Nevin, MA, CCC-SLP Speech Therapy

## 2022-01-08 NOTE — Plan of Care (Signed)
0700:Pt resting in bed at beginning of shift, pt alert and oriented x3, forgetful with delayed responses, pt impulsive and quickly gets up to go to bathroom alone, bed alarm in place, pt gait unsteady, fall mats on floor, pt re-oriented and educated on calling for help before getting out of bed. Pt on room air, bed in lowest position, and call bell within reach.  0900:RN started pt IV abx, a few minutes later RN was called to room, pt IV red/swollen and painful, RN stopped abx, and removed pt IV, pt stating her second IV burns when flushing, RN removed 2 nd IV as well. RN placed warm compress over area, and notified pharmacy, per pharmacy no need for other interventions, just warm pack. IV team consult placed  IV team came and got IV on pt.

## 2022-01-08 NOTE — Progress Notes (Addendum)
PROGRESS NOTE    Jane Watkins  ONG:[id number] DOB: 1995-09-01 DOA: 01/05/2022 PCP: Gweneth Dimitri, MD  26/F with history of right MCA stroke as well as hypoxic encephalopathy following drug overdose in 2018, required tracheostomy, inpatient rehab, with residual left hemiparesis, left arm spasticity, history of polysubstance abuse including fentanyl and cocaine was brought to the ED by EMS after witnessed overdose by bystanders who reported crack cocaine and fentanyl use, she was found pulseless and in asystole, had CPR by EMTs, ROSC estimated to be 20 minutes, transferred to the ED, intubated, UDS positive for cocaine, extubated 11/27, noted to be somnolent but slowly arousable, treated with D5 LR for hypoglycemia and Unasyn for aspiration pneumonia -Transferred to [facility] service today 11/29 ICU team  Subjective: -Feels okay overall, wants to talk to her dad, some productive cough, dysphagia 3 diet resumed yesterday  Assessment and Plan:  Acute anoxic and toxic encephalopathy Long history of drug abuse -Following cardiac arrest, in the setting of fentanyl and cocaine overdose -Extubated 11/27 -Mental status slowly improving, EEG in ICU was negative for seizures -PT OT consult, SLP following -Social work consult, may benefit from drug rehab  Acute hypoxic respiratory failure  Aspiration pneumonia -Secondary to overdose and aspiration pneumonia -Extubated 11/27 -Continue IV Unasyn X 5 days -Wean O2, increase activity  Severe metabolic acidosis -Resolved  Shock liver -Improving, will trend  Hypoglycemia -Likely secondary to cardiac arrest and aspiration pneumonia, now improved  History of CVA with spasticity -In the background of drug overdose in 2018 -Resume baclofen, gabapentin  Anxiety, depression -Resume Seroquel, BuSpar  History of seizures -Continue Lamictal, Tegretol  DVT prophylaxis: Lovenox Code Status: Full code Family Communication: Discussed with patient  detail, no family at bedside Disposition Plan: Home likely 2 to 3 days  Consultants: PCCM transfer   Procedures:   Antimicrobials:    Objective: Vitals:   01/07/22 2029 01/08/22 0421 01/08/22 0447 01/08/22 0737  BP: 127/84  123/87 115/70  Pulse: (!) 107  91 95  Resp:   14 18  Temp: 99.3 F (37.4 C)  98.1 F (36.7 C) 99.4 F (37.4 C)  TempSrc: Oral  Oral Oral  SpO2: 99%  100% 97%  Weight:  62 kg    Height:        Intake/Output Summary (Last 24 hours) at 01/08/2022 1020 Last data filed at 01/08/2022 0039 Gross per 24 hour  Intake 1274.94 ml  Output 775 ml  Net 499.94 ml   Filed Weights   01/06/22 0402 01/07/22 0500 01/08/22 0421  Weight: 58.9 kg 62 kg 62 kg    Examination:  General exam: Young female laying in bed, awake alert oriented to self and place, slow to respond to questions HEENT: No JVD CVS: S1-S2, regular rhythm Lungs: Few scattered rhonchi at the bases Abdomen: Soft, nontender, bowel sounds present Extremities: Trace edema Neuro: Left hemiplegia, left arm contracted Psychiatry:  Mood & affect appropriate.     Data Reviewed:   CBC: Recent Labs  Lab 01/05/22 1230 01/05/22 1247 01/05/22 2204 01/06/22 0415 01/06/22 0501 01/07/22 0719 01/07/22 0931 01/07/22 1122 01/08/22 0640  WBC 13.0*  --   --  13.9*  --  10.0  --   --  7.4  HGB 10.1*   < > 12.6 12.1 11.9* 10.7*  --   --  12.0  HCT 41.9   < > 37.0 36.4 35.0* 33.7*  --   --  35.6*  MCV 127.4*  --   --  88.8  --  91.6  --   --  89.0  PLT 291  --   --  241  --  212 SPECIMEN CLOTTED 205 220   < > = values in this interval not displayed.   Basic Metabolic Panel: Recent Labs  Lab 01/05/22 1230 01/05/22 1247 01/06/22 0415 01/06/22 0501 01/06/22 0835 01/06/22 1716 01/07/22 0446 01/08/22 0640  NA 132*   < > 139 141 141 140 138 138  K 4.4   < > 3.2* 3.0* 3.7 4.6 4.0 3.3*  CL 100  --  105  --  105 108 110 108  CO2 10*  --  25  --  25 24 22 22   GLUCOSE >1,200*  --  142*  --  94 76 78  93  BUN 19  --  19  --  16 13 9  <5*  CREATININE 1.85*  --  0.93  --  0.78 0.70 0.62 0.63  CALCIUM 7.6*  --  8.2*  --  8.4* 8.2* 8.1* 8.2*  MG 2.8*  --   --   --   --   --   --   --   PHOS >30.0*  --   --   --   --   --   --   --    < > = values in this interval not displayed.   GFR: Estimated Creatinine Clearance: 99.8 mL/min (by C-G formula based on SCr of 0.63 mg/dL). Liver Function Tests: Recent Labs  Lab 01/05/22 1230 01/06/22 0415 01/06/22 1716 01/07/22 0931 01/08/22 0640  AST 828* 1,598* 742* 385* 132*  ALT 717* 1,656* 1,251* 1,021* 781*  ALKPHOS 52 49 48 54 55  BILITOT 1.3* 1.2 1.3* 1.4* 1.4*  PROT 5.3* 5.0* 4.8* 4.9* 5.7*  ALBUMIN 3.2* 3.1* 3.0* 3.0* 3.2*   No results for input(s): "LIPASE", "AMYLASE" in the last 168 hours. No results for input(s): "AMMONIA" in the last 168 hours. Coagulation Profile: Recent Labs  Lab 01/05/22 1230 01/07/22 0931 01/08/22 0640  INR 1.5* 1.5* 1.2   Cardiac Enzymes: No results for input(s): "CKTOTAL", "CKMB", "CKMBINDEX", "TROPONINI" in the last 168 hours. BNP (last 3 results) No results for input(s): "PROBNP" in the last 8760 hours. HbA1C: No results for input(s): "HGBA1C" in the last 72 hours. CBG: Recent Labs  Lab 01/07/22 1633 01/07/22 2026 01/08/22 0036 01/08/22 0446 01/08/22 0727  GLUCAP 79 77 96 94 82   Lipid Profile: Recent Labs    01/05/22 1230  TRIG 32   Thyroid Function Tests: No results for input(s): "TSH", "T4TOTAL", "FREET4", "T3FREE", "THYROIDAB" in the last 72 hours. Anemia Panel: No results for input(s): "VITAMINB12", "FOLATE", "FERRITIN", "TIBC", "IRON", "RETICCTPCT" in the last 72 hours. Urine analysis:    Component Value Date/Time   COLORURINE YELLOW 01/05/2022 1617   APPEARANCEUR HAZY (A) 01/05/2022 1617   APPEARANCEUR Clear 04/07/2019 1405   LABSPEC 1.013 01/05/2022 1617   PHURINE 5.0 01/05/2022 1617   GLUCOSEU 150 (A) 01/05/2022 1617   HGBUR MODERATE (A) 01/05/2022 1617   BILIRUBINUR  NEGATIVE 01/05/2022 1617   BILIRUBINUR neg 08/27/2021 1503   BILIRUBINUR Negative 04/07/2019 1405   KETONESUR 20 (A) 01/05/2022 1617   PROTEINUR 30 (A) 01/05/2022 1617   UROBILINOGEN 0.2 08/27/2021 1503   NITRITE NEGATIVE 01/05/2022 1617   LEUKOCYTESUR NEGATIVE 01/05/2022 1617   Sepsis Labs: @LABRCNTIP (procalcitonin:4,lacticidven:4)  ) Recent Results (from the past 240 hour(s))  Urine Culture     Status: None   Collection Time: 01/05/22  4:17 PM   Specimen:  Urine, Catheterized  Result Value Ref Range Status   Specimen Description URINE, CATHETERIZED  Final   Special Requests NONE  Final   Culture   Final    NO GROWTH Performed at [facility], [address] [place], [place] [address]    Report Status 01/06/2022 FINAL  Final  MRSA Next Gen by PCR, Nasal     Status: None   Collection Time: 01/05/22  4:29 PM   Specimen: Nasal Mucosa; Nasal Swab  Result Value Ref Range Status   MRSA by PCR Next Gen NOT DETECTED NOT DETECTED Final    Comment: (NOTE) The GeneXpert MRSA Assay (FDA approved for NASAL specimens only), is one component of a comprehensive MRSA colonization surveillance program. It is not intended to diagnose MRSA infection nor to guide or monitor treatment for MRSA infections. Test performance is not FDA approved in patients less than 26 years old. Performed at [facility], [address] [place], [place] [address]   Culture, blood (Routine X 2) w Reflex to ID Panel     Status: None (Preliminary result)   Collection Time: 01/06/22  9:02 AM   Specimen: BLOOD LEFT ARM  Result Value Ref Range Status   Specimen Description BLOOD LEFT ARM  Final   Special Requests IN PEDIATRIC BOTTLE Blood Culture adequate volume  Final   Culture   Final    NO GROWTH 2 DAYS Performed at [facility], [address] [place], [place] [address]    Report Status PENDING  Incomplete  Culture, blood (Routine X 2) w Reflex to ID Panel     Status: None (Preliminary  result)   Collection Time: 01/06/22  9:02 AM   Specimen: BLOOD LEFT ARM  Result Value Ref Range Status   Specimen Description BLOOD LEFT ARM  Final   Special Requests   Final    BOTTLES DRAWN AEROBIC ONLY Blood Culture results may not be optimal due to an inadequate volume of blood received in culture bottles   Culture   Final    NO GROWTH 2 DAYS Performed at [facility], [address] [place], [place] [address]    Report Status PENDING  Incomplete     Radiology Studies: DG Chest Port 1 View  Result Date: 01/08/2022 CLINICAL DATA:  Acute respiratory failure. Recheck right lung density. EXAM: PORTABLE CHEST 1 VIEW COMPARISON:  AP chest 01/06/2022, 01/05/2022, 02/07/2019 FINDINGS: Interval removal of endotracheal tube and enteric tube. Cardiac silhouette and mediastinal contours are within normal limits. Redemonstration of vague density overlying the inferior medial right lung. Again this does not obscure the right heart border. This may be within the right lower lobe. It is again new from 01/05/2022 and appears similar to 01/06/2022. The right lung is clear. No definite pleural effusion. No pneumothorax. No acute skeletal abnormality. IMPRESSION: 1. Interval removal of endotracheal tube and enteric tube. 2. Redemonstration of vague density overlying the inferior medial right lung. This may be within the right lower lobe. It is again new from 01/05/2022 and appears similar to 01/06/2022. This may represent developing infection or sequela of aspiration. Electronically Signed   By: Neita Garnetonald  Viola M.D.   On: 01/08/2022 08:38   DG Swallowing Func-Speech Pathology  Result Date: 01/07/2022 Table formatting from the original result was not included. Objective Swallowing Evaluation: Type of Study: MBS-Modified Barium Swallow Study  Patient Details Name: Jane Watkins MRN: [id number] Date of Birth: 04/30/1995 Today's Date: 01/07/2022 Time: SLP Start  Time (ACUTE ONLY): 1040 -SLP Stop Time (ACUTE  ONLY): 1055 SLP Time Calculation (min) (ACUTE ONLY): 15 min Past Medical History: Past Medical History: Diagnosis Date  AKI (acute kidney injury) (HCC) 2018  "from overdose"  Anxiety   Chlamydia   Daily headache   Depression   Drug overdose 04/2016  [name] 05/01/2016  GERD (gastroesophageal reflux disease)   "when I was younger; gone now" (09/21/2017)  IV drug abuse (HCC) 09/20/2017  Migraine   "q couple weeks" (09/21/2017)  Opioid abuse (HCC)   Overdose   Stroke [facility])  Past Surgical History: Past Surgical History: Procedure Laterality Date  APPENDECTOMY  04/2013  I & D EXTREMITY Left 09/20/2017  Procedure: IRRIGATION AND DEBRIDEMENT LEFT ARM;  Surgeon: Bradly Bienenstock, MD;  Location: [facility] OR;  Service: Orthopedics;  Laterality: Left;  TEE WITHOUT CARDIOVERSION N/A 02/10/2019  Procedure: TRANSESOPHAGEAL ECHOCARDIOGRAM (TEE);  Surgeon: Debbe Odea, MD;  Location: [facility] ORS;  Service: Cardiovascular;  Laterality: N/A;  Polysubstance abuser  TONGUE SURGERY  ~ 2007  "related to lisp"  TRACHEOSTOMY  04/2016  /notes 05/01/2016 HPI: Patient is a 26 y.o. 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 She presented to the ED on 01/05/22 with witnessed overdose of crack cocaine and fentanyl. She was found pulseless and in asystole; downtime unclear, no bystander CPR, time to ROSC estimated at 20 minutes, no response to Narcan. She was changed from [place] to ETT for airway. CT head ordered but did not show any acute changes.  PCXR showed lungs to be clear, no infiltrate. She was extubated to supplemental oxygen via nasal cannula on 01/06/22. She failed [facility] with nursing and is NPO awaiting SLP evaluation.  Subjective: more alert than this morning, participates fully  Recommendations for follow up therapy are one component of a multi-disciplinary discharge planning process, led by the attending physician.  Recommendations  may be updated based on patient status, additional functional criteria and insurance authorization. Assessment / Plan / Recommendation   01/07/2022   1:33 PM Clinical Impressions Clinical Impression Patient presents with a mild oropharyngeal dysphagia as per this MBS. During oral phase, she exhibited mildly delayed mastication with mechanical soft solids but with full clearance of oral cavity post swallow. During pharyngeal phase of swallow, patient exhibited swallow initiation delays at level of vallecular sinus with puree, mechanical soft, nectar and thin via cup sips and swallow initiation delay at level of pyriform sinus with thin liquids via straw sips. No significant pharyngeal residuals remained post initial swallows with any of the tested consistencies. Trace aspiration occured with thin liquids via straw sip, resulting in immediate cough response from patient. She also exhibited trace aspiration which was sensed and occuring during the swallow with cup sip of thin liquids towards end of study. No aspiration or penetration observed with nectar thick liquids. 88mm barium tablet transited through pharynx and UES without difficulty; esophageal sweep did not reveal any stasis or concerns for dysmotility. SLP recommending initiate PO diet of Dys 3 (mechanical soft) solids and nectar thick liquids and allow patient to have plain/thin water in between meals.     01/07/2022   1:32 PM Treatment Recommendations Treatment Recommendations Therapy as outlined in treatment plan below     01/07/2022   1:32 PM Prognosis Prognosis for Safe Diet Advancement Good Barriers to Reach Goals Cognitive deficits   01/07/2022   1:32 PM Diet Recommendations SLP Diet Recommendations Dysphagia 3 (Mech soft)  solids;Nectar thick liquid Liquid Administration via Cup;No straw Medication Administration Whole meds with puree Compensations Minimize environmental distractions;Slow rate;Small sips/bites Postural Changes Seated upright at 90  degrees     01/07/2022   1:32 PM Other Recommendations Oral Care Recommendations Oral care BID Follow Up Recommendations Follow physician's recommendations for discharge plan and follow up therapies Functional Status Assessment Patient has had a recent decline in their functional status and demonstrates the ability to make significant improvements in function in a reasonable and predictable amount of time.   01/07/2022   1:32 PM Frequency and Duration  Speech Therapy Frequency (ACUTE ONLY) min 2x/week Treatment Duration 2 weeks     01/07/2022   1:28 PM Oral Phase Oral Phase Impaired Oral - Nectar Cup [facility] Oral - Thin Cup WFL Oral - Puree WFL Oral - Mech Soft Impaired mastication Oral - Pill [facility]    01/07/2022   1:30 PM Pharyngeal Phase Pharyngeal Phase Impaired Pharyngeal- Nectar Cup Delayed swallow initiation-vallecula Pharyngeal- Thin Cup Delayed swallow initiation-vallecula;Penetration/Aspiration during swallow Pharyngeal Material enters airway, passes BELOW cords and not ejected out despite cough attempt by patient Pharyngeal- Thin Straw Penetration/Aspiration during swallow;Reduced airway/laryngeal closure;Delayed swallow initiation-pyriform sinuses Pharyngeal Material enters airway, passes BELOW cords and not ejected out despite cough attempt by patient Pharyngeal- Puree Delayed swallow initiation-vallecula Pharyngeal- Mechanical Soft Delayed swallow initiation-vallecula Pharyngeal- Pill [facility]    01/07/2022   1:32 PM Cervical Esophageal Phase  Cervical Esophageal Phase WFL Angela Nevin, MA, CCC-SLP Speech Therapy                       Scheduled Meds:  Chlorhexidine Gluconate Cloth  6 each Topical Daily   docusate sodium  100 mg Oral BID   enoxaparin (LOVENOX) injection  40 mg Subcutaneous Q24H   gabapentin  300 mg Oral QHS   lamoTRIgine  25 mg Oral BID   OXcarbazepine  150 mg Oral BID   polyethylene glycol  17 g Oral Daily   Continuous Infusions:  sodium chloride Stopped (01/07/22 1025)   sodium  chloride     ampicillin-sulbactam (UNASYN) IV 3 g (01/08/22 0858)     LOS: 3 days    Time spent:    Zannie Cove, MD [facility]   01/08/2022, 10:20 AM

## 2022-01-09 DIAGNOSIS — I469 Cardiac arrest, cause unspecified: Secondary | ICD-10-CM | POA: Diagnosis not present

## 2022-01-09 LAB — COMPREHENSIVE METABOLIC PANEL
ALT: 509 U/L — ABNORMAL HIGH (ref 0–44)
AST: 44 U/L — ABNORMAL HIGH (ref 15–41)
Albumin: 3.2 g/dL — ABNORMAL LOW (ref 3.5–5.0)
Alkaline Phosphatase: 50 U/L (ref 38–126)
Anion gap: 7 (ref 5–15)
BUN: <5 mg/dL — ABNORMAL LOW (ref 6–20)
CO2: 23 mmol/L (ref 22–32)
Calcium: 8.7 mg/dL — ABNORMAL LOW (ref 8.9–10.3)
Chloride: 117 mmol/L — ABNORMAL HIGH (ref 98–111)
Creatinine, Ser: 0.65 mg/dL (ref 0.44–1.00)
GFR, Estimated: >60 mL/min (ref >60)
Glucose, Bld: 90 mg/dL (ref 70–99)
Potassium: 3.8 mmol/L (ref 3.5–5.1)
Sodium: 147 mmol/L — ABNORMAL HIGH (ref 135–145)
Total Bilirubin: 0.8 mg/dL (ref 0.3–1.2)
Total Protein: 5.7 g/dL — ABNORMAL LOW (ref 6.5–8.1)

## 2022-01-09 LAB — GLUCOSE, CAPILLARY
Glucose-Capillary: 120 mg/dL — ABNORMAL HIGH (ref 70–99)
Glucose-Capillary: 87 mg/dL (ref 70–99)
Glucose-Capillary: 111 mg/dL — ABNORMAL HIGH (ref 70–99)
Glucose-Capillary: 115 mg/dL — ABNORMAL HIGH (ref 70–99)
Glucose-Capillary: 139 mg/dL — ABNORMAL HIGH (ref 70–99)

## 2022-01-09 NOTE — TOC Initial Note (Signed)
Transition of Care [facility]) - Initial/Assessment Note    Patient Details  Name: Jane Watkins MRN: [id number] Date of Birth: 10-03-1995  [facility] CM/SW Contact:    Curlene Labrum, RN Phone Number: 01/09/2022, 3:02 PM  Clinical Narrative:                 CM met with the patient at the bedside to discuss transitions of care needs.  The patient was seen by PT at the bedside and CM informed that the patient's mother would like to speak with CM about options for drug rehabilitation programs.  The patient was admitted to [facility] hospital after an OD on fentanyl and crack cocaine.  I called and spoke with attending physician, Dr. Cruzita Lederer and he placed an order for psychiatric evaluation.  The patient states that she currently lives with her father at the home, who works during the day.  The patient states that she smokes cocaine daily and uses marijuana weekly.  The patient states that she was recently admitted to [facility] program and left AMA this past Tuesday.  The patient's mother states that she would like the patient to discharge to her home in [place], [place], where the mother works out of the home and can provide 24 hour supervision since the patient has "short term memory issues".  Patient is able to ambulate with use of DME at this time and at baseline prior to admission to the hospital.  Psychiatry eval is pending at this time.  CM will continue to follow the patient for TOC needs.  Expected Discharge Plan: Home/Self Care Barriers to Discharge: Continued Medical Work up   Patient Goals and CMS Choice Patient states their goals for this hospitalization and ongoing recovery are:: To get better CMS Medicare.gov Compare Post Acute Care list provided to:: Patient (Patient's mother present in the room) Choice offered to / list presented to : Patient, Parent  Expected Discharge Plan and Services Expected Discharge Plan: Home/Self Care   Discharge  Planning Services: CM Consult   Living arrangements for the past 2 months: Single Family Home                                      Prior Living Arrangements/Services Living arrangements for the past 2 months: Single Family Home Lives with:: Parents Patient language and need for interpreter reviewed:: Yes Do you feel safe going back to the place where you live?: Yes (Patient's mother would like the patient to discharge to her home where she can provide 24 hour supervision/ care)      Need for Family Participation in Patient Care: Yes (Comment) Care giver support system in place?: Yes (comment)   Criminal Activity/Legal Involvement Pertinent to Current Situation/Hospitalization: No - Comment as needed  Activities of Daily Living      Permission Sought/Granted Permission sought to share information with : Case Manager, Family Supports Permission granted to share information with : Yes, Verbal Permission Granted        Permission granted to share info w Relationship: mother - Viann Fish - [phone]     Emotional Assessment Appearance:: Appears stated age Attitude/Demeanor/Rapport: Gracious Affect (typically observed): Accepting Orientation: : Oriented to Self, Oriented to Place, Oriented to  Time, Oriented to Situation Alcohol / Substance Use: Illicit Drugs Psych Involvement: Yes (comment)  Admission diagnosis:  Acidosis [E87.20] Cardiac arrest ([place]) [I46.9] Overdose of undetermined  intent, initial encounter [T50.904A] Patient Active Problem List   Diagnosis Date Noted   Cardiac arrest ([place]) 01/05/2022   Tracheostomy status ([place]) 07/05/2019   Postcoital UTI 05/24/2019   Spastic hemiparesis (HCC)    Vascular headache    Mood disorder in conditions classified elsewhere    Right middle cerebral artery stroke ([place]) 02/16/2019   Opioid use disorder, mild, in sustained remission ([place])    Dyslipidemia    Ischemic stroke diagnosed during current admission ([place])  02/13/2019   MDD (major depressive disorder), recurrent, in full remission ([place]) 02/10/2019   CVA (cerebral vascular accident) ([place]) 02/07/2019   Polysubstance abuse ([place]) 09/20/2017   TBI (traumatic brain injury) ([place]) 05/01/2016   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 [place] [address] Phone: [phone] Fax: [place] [address], [place] [address] Phone: [phone] Fax: [phone]     Social Determinants of Health (SDOH) Interventions    Readmission Risk Interventions    01/09/2022    3:02 PM  Readmission Risk Prevention Plan  Transportation Screening Complete  PCP or Specialist Appt within 5-7 Days Complete  Home Care Screening Complete  Medication Review (RN CM) Complete

## 2022-01-09 NOTE — Evaluation (Signed)
Physical Therapy Evaluation/ Discharge Patient Details Name: Jane Watkins MRN: [id number] DOB: 1995/12/06 Today's Date: 01/09/2022  History of Present Illness  26 yo female admitted 11/26 after found pulseless with OD on crack and fentanyl. CRP by EMS with ROSC after 20 min . Extubated 11/27. 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 Pt with baseline left hemiparesis with significant fall history grossly 15 falls in the last year. Pt attended OPPT in Oct but stopped going because of lack of immediate progress. PT also states she was in IP drug rehab prior to 11/23 but checked out AMA. Pt states multiple stents with OP and IP drug rehab programs but nothing works. Pt at baseline functional level and would benefit from OPPT but pt doesn't express a desire to perform the required bracing and HEP to make a functional impact. Pt able to perform basic mobility and gait without assist with noted gait and balance deficits as baseline. No further acute therapy needs with pt encouraged to consider drug rehab options and to seek OPPT if she determines to make a functional change. Pt safe to walk with nursing and mobility staff. Will sign off with pt and mom aware and agreeable.      Recommendations for follow up therapy are one component of a multi-disciplinary discharge planning process, led by the attending physician.  Recommendations may be updated based on patient status, additional functional criteria and insurance authorization.  Follow Up Recommendations No PT follow up      Assistance Recommended at Discharge PRN  Patient can return home with the following  Direct supervision/assist for medications management    Equipment Recommendations None recommended by PT  Recommendations for Other  Services       Functional Status Assessment Patient has not had a recent decline in their functional status     Precautions / Restrictions Precautions Precautions: Fall Precaution Comments: left hemiparesis      Mobility  Bed Mobility Overal bed mobility: Modified Independent                  Transfers Overall transfer level: Modified independent                      Ambulation/Gait Ambulation/Gait assistance: Supervision Gait Distance (Feet): 400 Feet Assistive device: None Gait Pattern/deviations: Step-through pattern, Decreased stance time - left, Decreased dorsiflexion - left   Gait velocity interpretation: >2.62 ft/sec, indicative of community ambulatory   General Gait Details: pt toe walking on left with decreased stance and stride. Pt able to complete long hall ambulation, change of direction and stairs without physical assist  Stairs Stairs: Yes Stairs assistance: Modified independent (Device/Increase time) Stair Management: Alternating pattern, Forwards Number of Stairs: 6    Wheelchair Mobility    Modified Rankin (Stroke Patients Only)       Balance Overall balance assessment: History of Falls, Mild deficits observed, not formally tested                                           Pertinent Vitals/Pain Pain Assessment Pain Assessment: No/denies pain    Home Living Family/patient expects to be discharged to:: Private residence Living Arrangements: Parent Available Help at Discharge:  Family;Available PRN/intermittently Type of Home: Mobile home Home Access: Stairs to enter Entrance Stairs-Rails: Right Entrance Stairs-Number of Steps: 4   Home Layout: One level Home Equipment: Agricultural consultant (2 wheels);Cane - single point Additional Comments: pt states she has a RW under her bed and a cane somewhere, also has AFO and hand splint but stopped wearing them after return home in 2020    Prior Function Prior Level of  Function : Independent/Modified Independent                     Hand Dominance        Extremity/Trunk Assessment   Upper Extremity Assessment Upper Extremity Assessment: LUE deficits/detail LUE Deficits / Details: pt with baseline hemiparesis with elbow flexion and PIP flexion but no noted DIP flexion LUE Coordination: decreased fine motor;decreased gross motor    Lower Extremity Assessment Lower Extremity Assessment: LLE deficits/detail LLE Deficits / Details: plantar flexion contracture    Cervical / Trunk Assessment Cervical / Trunk Assessment: Normal  Communication   Communication: HOH  Cognition Arousal/Alertness: Awake/alert Behavior During Therapy: Flat affect Overall Cognitive Status: History of cognitive impairments - at baseline Area of Impairment: Safety/judgement                         Safety/Judgement: Decreased awareness of safety     General Comments: pt self reportedly is in need of instant gratification which is why she has left OPPT as well as OP and IP drug rehab programs. Pt expresses no concern for her own life but also states not actively suicidal        General Comments      Exercises     Assessment/Plan    PT Assessment Patient does not need any further PT services  PT Problem List         PT Treatment Interventions      PT Goals (Current goals can be found in the Care Plan section)  Acute Rehab PT Goals PT Goal Formulation: All assessment and education complete, DC therapy    Frequency       Co-evaluation               AM-PAC PT "6 Clicks" Mobility  Outcome Measure Help needed turning from your back to your side while in a flat bed without using bedrails?: None Help needed moving from lying on your back to sitting on the side of a flat bed without using bedrails?: None Help needed moving to and from a bed to a chair (including a wheelchair)?: None Help needed standing up from a chair using your arms  (e.g., wheelchair or bedside chair)?: None Help needed to walk in hospital room?: A Little Help needed climbing 3-5 steps with a railing? : A Little 6 Click Score: 22    End of Session Equipment Utilized During Treatment: Gait belt Activity Tolerance: Patient tolerated treatment well Patient left: in chair;with call bell/phone within reach;with family/visitor present (no chair alarm pads on unit with RN aware) Nurse Communication: Mobility status PT Visit Diagnosis: Other abnormalities of gait and mobility (R26.89)    Time: 1610-9604 PT Time Calculation (min) (ACUTE ONLY): 21 min   Charges:   PT Evaluation $PT Eval Low Complexity: 1 Low          Ranell Skibinski P, PT Acute Rehabilitation Services Office: [phone]   Enedina Finner Lenin Kuhnle 01/09/2022, 1:52 PM

## 2022-01-09 NOTE — Progress Notes (Signed)
PROGRESS NOTE  Jane Watkins [id number] DOB: 1995/05/19 DOA: 01/05/2022 PCP: Waunita Schooner, MD   LOS: 4 days   Brief Narrative / Interim history: 26 year old female with history of right MCA stroke with residual left-sided weakness, hypoxic encephalopathy following drug overdose in 2018 (required tracheostomy, had inpatient rehab), history of polysubstance abuse comes into the hospital after being found down, found pulseless and in asystole after overdose on crack cocaine and fentanyl.  She had CPR by EMTs, [place] after 20 minutes, and was brought to the hospital.  She was in the ICU, eventually extubated 11/27 and transferred to the [facility] service on 11/29.  Subjective / 24h Interval events: She is doing well this morning, eating breakfast.  Tells me she overdosed on crack cocaine, but thinks it was laced with fentanyl.  She denies any self-harm intent, tells me she just "wants to get high".  She is confused still, initially thinks she was in rehab, and then tells me that she came from rehab in [place].  She also says that she has been here for a week and a half.  Feels very "foggy", like her head is underwater  Assesement and Plan: Principal problem Acute hypoxic and toxic encephalopathy-following cardiac arrest due to fentanyl and cocaine overdose.  She has been extubated 11/27.  While alert and conversant, she clearly has confusion and seems to be disoriented, tangential thinking.  She thinks she has been here for a week and a half but now its only been 3 days.  She also tells me she is going to group sessions to get therapy for her substance abuse.  She also mentioned that she just got here from [facility] rehab  Active problems Long history of drug use, PTSD, depression-continue home medications.  Due to recurrent overdose psychiatry consulted as well.  Acute hypoxic respiratory failure, aspiration pneumonia -Secondary to overdose and aspiration pneumonia, complete 5 days of Unasyn.   Currently on room air  History of CVA with spasticity -In the background of drug overdose in 2018, continue home medications.  Follows with PMR regularly as an outpatient.  PT eval pending  Severe metabolic acidosis-Resolved   Shock liver -Improving, will trend   Hypoglycemia -Likely secondary to cardiac arrest and aspiration pneumonia, now improved   History of seizures -Continue Lamictal, Tegretol  Scheduled Meds:  baclofen  10 mg Oral TID   busPIRone  10 mg Oral TID   Chlorhexidine Gluconate Cloth  6 each Topical Daily   docusate sodium  100 mg Oral BID   enoxaparin (LOVENOX) injection  40 mg Subcutaneous Q24H   gabapentin  300 mg Oral QHS   lamoTRIgine  25 mg Oral BID   mirtazapine  15 mg Oral QHS   nicotine  14 mg Transdermal Daily   OXcarbazepine  150 mg Oral BID   polyethylene glycol  17 g Oral Daily   QUEtiapine  50 mg Oral QHS   Continuous Infusions:  sodium chloride Stopped (01/07/22 1025)   sodium chloride     ampicillin-sulbactam (UNASYN) IV 3 g (01/09/22 0934)   PRN Meds:.Place/Maintain arterial line **AND** sodium chloride, acetaminophen **OR** acetaminophen (TYLENOL) oral liquid 160 mg/5 mL **OR** acetaminophen, menthol-cetylpyridinium, ondansetron (ZOFRAN) IV  Current Outpatient Medications  Medication Instructions   acetaminophen (TYLENOL) 650 mg, Oral, Every 4 hours PRN   atorvastatin (LIPITOR) 20 mg, Oral, Daily-1800   baclofen (LIORESAL) 10 mg, Oral, 3 times daily   busPIRone (BUSPAR) 10 MG tablet TAKE 1 TABLET BY MOUTH THREE TIMES A DAY  gabapentin (NEURONTIN) 300 mg, Oral, Daily at bedtime   lamoTRIgine (LAMICTAL) 50 mg, Oral, Daily   mirtazapine (REMERON) 15 mg, Oral, Daily at bedtime   naloxone (NARCAN) nasal spray 4 mg/0.1 mL ADMINISTER A SINGLE SPRAY IN ONE NOSTRIL UPON SIGNS OF OPIOID OVERDOSE. REPEAT EVERY 2 - 3 MINUTES IF NO RESPONSE IN ALTERNATING NOSTRIL TIL EMS ARRIVES.   nicotine (NICODERM CQ - DOSED IN MG/24 HOURS) 21 mg, Transdermal,  Daily   nitrofurantoin (macrocrystal-monohydrate) (MACROBID) 100 mg, Oral, 2 times daily   OXcarbazepine (TRILEPTAL) 150 mg, Oral, 2 times daily   propranolol (INDERAL) 10 mg, Oral, Daily PRN   QUEtiapine (SEROQUEL) 50 mg, Oral, Daily at bedtime   spironolactone (ALDACTONE) 50 mg, Oral, 2 times daily   SUMAtriptan (IMITREX) 50 MG tablet Take 1 tablet by mouth every 2 hours as needed for migraine. May repeat in 2 hours if headache persists or recurs.   tiZANidine (ZANAFLEX) 4 mg, Oral, 3 times daily    Diet Orders (From admission, onward)     Start     Ordered   01/08/22 1115  DIET DYS 3 Room service appropriate? No; Fluid consistency: Nectar Thick  Diet effective now       Comments: No Straws Meds whole in puree May have plain/thin water in between meals-cups sips only (no straws)  Question Answer Comment  Room service appropriate? No   Fluid consistency: Nectar Thick      01/08/22 1115            DVT prophylaxis: enoxaparin (LOVENOX) injection 40 mg Start: 01/06/22 0930 SCDs Start: 01/05/22 1405   Lab Results  Component Value Date   PLT 220 01/08/2022      Code Status: Full Code  Family Communication: no family at bedside   Status is: Inpatient  Remains inpatient appropriate because: Persistent confusion  Level of care: Med-Surg  Consultants:  PCCM Psychiatry   Objective: Vitals:   01/08/22 1552 01/08/22 2000 01/09/22 0357 01/09/22 0933  BP: 131/85 123/81 125/83 119/89  Pulse: 89 92 85 86  Resp: 17   15  Temp: 98.9 F (37.2 C) 98.2 F (36.8 C) 98.3 F (36.8 C) 98.2 F (36.8 C)  TempSrc: Oral Oral Oral Oral  SpO2: 97% 100% 100% 99%  Weight:   61.8 kg   Height:        Intake/Output Summary (Last 24 hours) at 01/09/2022 1110 Last data filed at 01/09/2022 0954 Gross per 24 hour  Intake 720 ml  Output --  Net 720 ml   Wt Readings from Last 3 Encounters:  01/09/22 61.8 kg  10/15/21 57.6 kg  10/11/21 54.9 kg     Examination:  Constitutional: NAD Eyes: no scleral icterus ENMT: Mucous membranes are moist.  Neck: normal, supple Respiratory: clear to auscultation bilaterally, no wheezing, no crackles. Normal respiratory effort. No accessory muscle use.  Cardiovascular: Regular rate and rhythm, no murmurs / rubs / gallops. No LE edema.  Abdomen: non distended, no tenderness. Bowel sounds positive.  Musculoskeletal: no clubbing / cyanosis.   Data Reviewed: I have independently reviewed following labs and imaging studies   CBC Recent Labs  Lab 01/05/22 1230 01/05/22 1247 01/05/22 2204 01/06/22 0415 01/06/22 0501 01/07/22 0719 01/07/22 0931 01/07/22 1122 01/08/22 0640  WBC 13.0*  --   --  13.9*  --  10.0  --   --  7.4  HGB 10.1*   < > 12.6 12.1 11.9* 10.7*  --   --  12.0  HCT 41.9   < >  37.0 36.4 35.0* 33.7*  --   --  35.6*  PLT 291  --   --  241  --  212 SPECIMEN CLOTTED 205 220  MCV 127.4*  --   --  88.8  --  91.6  --   --  89.0  MCH 30.7  --   --  29.5  --  29.1  --   --  30.0  MCHC 24.1*  --   --  33.2  --  31.8  --   --  33.7  RDW 13.4  --   --  13.1  --  13.1  --   --  12.8   < > = values in this interval not displayed.    Recent Labs  Lab 01/05/22 1230 01/05/22 1247 01/06/22 0038 01/06/22 0415 01/06/22 0501 01/06/22 0835 01/06/22 1716 01/07/22 0446 01/07/22 0931 01/07/22 0953 01/08/22 0640 01/09/22 0816  NA 132*   < >  --  139   < > 141 140 138  --   --  138 147*  K 4.4   < >  --  3.2*   < > 3.7 4.6 4.0  --   --  3.3* 3.8  CL 100  --   --  105  --  105 108 110  --   --  108 117*  CO2 10*  --   --  25  --  25 24 22   --   --  22 23  GLUCOSE >1,200*  --   --  142*  --  94 76 78  --   --  93 90  BUN 19  --   --  19  --  16 13 9   --   --  <5* <5*  CREATININE 1.85*  --   --  0.93  --  0.78 0.70 0.62  --   --  0.63 0.65  CALCIUM 7.6*  --   --  8.2*  --  8.4* 8.2* 8.1*  --   --  8.2* 8.7*  AST 828*  --   --  1,598*  --   --  742*  --  385*  --  132* 44*  ALT 717*  --    --  1,656*  --   --  1,251*  --  1,021*  --  781* 509*  ALKPHOS 52  --   --  49  --   --  48  --  54  --  55 50  BILITOT 1.3*  --   --  1.2  --   --  1.3*  --  1.4*  --  1.4* 0.8  ALBUMIN 3.2*  --   --  3.1*  --   --  3.0*  --  3.0*  --  3.2* 3.2*  MG 2.8*  --   --   --   --   --   --   --   --   --   --   --   DDIMER  --   --   --   --   --   --   --   --  2.15*  --   --   --   PROCALCITON <0.10  --   --  7.78  --   --   --   --   --   --   --   --   LATICACIDVEN >9.0*  --  3.5*  --   --   --   --   --   --  1.1  --   --   INR 1.5*  --   --   --   --   --   --   --  1.5*  --  1.2  --    < > = values in this interval not displayed.    ------------------------------------------------------------------------------------------------------------------ No results for input(s): "CHOL", "HDL", "LDLCALC", "TRIG", "CHOLHDL", "LDLDIRECT" in the last 72 hours.  Lab Results  Component Value Date   HGBA1C 5.1 11/05/2020   ------------------------------------------------------------------------------------------------------------------ No results for input(s): "TSH", "T4TOTAL", "T3FREE", "THYROIDAB" in the last 72 hours.  Invalid input(s): "FREET3"  Cardiac Enzymes No results for input(s): "CKMB", "TROPONINI", "MYOGLOBIN" in the last 168 hours.  Invalid input(s): "CK" ------------------------------------------------------------------------------------------------------------------ No results found for: "BNP"  CBG: Recent Labs  Lab 01/08/22 1136 01/08/22 1541 01/08/22 2011 01/09/22 0137 01/09/22 0934  GLUCAP 86 85 168* 120* 87    Recent Results (from the past 240 hour(s))  Urine Culture     Status: None   Collection Time: 01/05/22  4:17 PM   Specimen: Urine, Catheterized  Result Value Ref Range Status   Specimen Description URINE, CATHETERIZED  Final   Special Requests NONE  Final   Culture   Final    NO GROWTH Performed at [facility], [address] [place], [place]  [address]    Report Status 01/06/2022 FINAL  Final  MRSA Next Gen by PCR, Nasal     Status: None   Collection Time: 01/05/22  4:29 PM   Specimen: Nasal Mucosa; Nasal Swab  Result Value Ref Range Status   MRSA by PCR Next Gen NOT DETECTED NOT DETECTED Final    Comment: (NOTE) The GeneXpert MRSA Assay (FDA approved for NASAL specimens only), is one component of a comprehensive MRSA colonization surveillance program. It is not intended to diagnose MRSA infection nor to guide or monitor treatment for MRSA infections. Test performance is not FDA approved in patients less than 75 years old. Performed at [facility], [address], [place], [place] [address]   Culture, blood (Routine X 2) w Reflex to ID Panel     Status: None (Preliminary result)   Collection Time: 01/06/22  9:02 AM   Specimen: BLOOD LEFT ARM  Result Value Ref Range Status   Specimen Description BLOOD LEFT ARM  Final   Special Requests IN PEDIATRIC BOTTLE Blood Culture adequate volume  Final   Culture   Final    NO GROWTH 3 DAYS Performed at [facility], [address] [place], [place] [address]    Report Status PENDING  Incomplete  Culture, blood (Routine X 2) w Reflex to ID Panel     Status: None (Preliminary result)   Collection Time: 01/06/22  9:02 AM   Specimen: BLOOD LEFT ARM  Result Value Ref Range Status   Specimen Description BLOOD LEFT ARM  Final   Special Requests   Final    BOTTLES DRAWN AEROBIC ONLY Blood Culture results may not be optimal due to an inadequate volume of blood received in culture bottles   Culture   Final    NO GROWTH 3 DAYS Performed at [facility], [address] [place], [place] [address]    Report Status PENDING  Incomplete     Radiology Studies: No results found.   Pamella Pert, MD, PhD [facility]  Between 7 am - 7 pm I am available, please contact me via Amion (for emergencies) or Securechat (non urgent messages)  Between 7 pm - 7 am I am not  available, please contact night coverage MD/APP via Amion

## 2022-01-09 NOTE — Consult Note (Signed)
[facility] Psychiatry New Face-to-Face Psychiatric Evaluation   Service Date: January 09, 2022 LOS:  LOS: 4 days    Assessment  Jane Watkins is a 26 year old female with a past psychiatric history of polysubstance use and IV drug use.  She has a past medical history that is notable for multiple overdoses resulting in acute inpatient rehab admissions.  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  She has no previous psychiatric admissions.  She was medically admitted on 11/26 for cardiac arrest.  According to previous notes, a bystander called EMS after she was unresponsive and they reported an overdose on fentanyl and crack cocaine.  CPR was performed for 20 minutes before ROSC was obtained.  Since that time the patient's mental status has gradually improved.  Psychiatry was consulted for potential self-harm by Dr. Wyonia Hough.  Regarding the consultation question, there is no evidence that the patient intentionally took an overdose with the intention of harming herself or ending her life. She reports that she was smoking crack cocaine and feels it must have been laced with fentanyl. An explanation that appears reasonable. She reports recently leaving rehab "because I wanted to get high" but denies major depression or recent suicidal thoughts. Her uncontrolled substance use is certainly concerning and warrants treatment, especially given the toll it has taken on her physical health. No acute safety concerns, patient does not meet criteria for inpatient hospitalization.    The patient reports an interest in attaining abstinence from drugs. She resides in [place] and has [employer], making her eligible for rehab (e.g. [facility]) or CDIOP at [facility] She is considering these options presently. She would also benefit from regular therapy if this can be arranged. Can discuss with LCSW. Will continue to consider medication changes.   Further medication adjustments  can be made. Ordered repeat EKG given long Qtc.   Diagnoses:  Active Hospital problems: Principal Problem:   Cardiac arrest [facility])     Plan  ## Safety and Observation Level:  - Based on my clinical evaluation, I estimate the patient to be at low risk of self harm in the current setting - At this time, we recommend a routine level of observation. This decision is based on my review of the chart including patient's history and current presentation, interview of the patient, mental status examination, and consideration of suicide risk including evaluating suicidal ideation, plan, intent, suicidal or self-harm behaviors, risk factors, and protective factors. This judgment is based on our ability to directly address suicide risk, implement suicide prevention strategies and develop a safety plan while the patient is in the clinical setting. Please contact our team if there is a concern that risk level has changed.   ## Medications:  -Continue home baclofen as prescribed - Continue home gabapentin as prescribed - Continue home lamotrigine as prescribed - Continue home Trileptal as prescribed - Continue Remeron 15 mg nightly, new medication? - Continue home Seroquel as prescribed   ## Medical Decision Making Capacity:  Not assessed on this encounter  ## Further Work-up:  Per primary  ## Disposition:  TBD  ## Behavioral / Environmental:  --Routine obs  ##Legal Status VOL  Thank you for this consult request. Recommendations have been communicated to the primary team.  We will follow at this time.   [name], MD   NEW history  Relevant Aspects of Hospital Course:  Admitted on 01/05/2022 for Cardiac arrest.  Patient Report:  Mental status  exam is notable for the patient being fully alert and oriented with intact attention on testing.  She reports using drugs starting at 26 years old when she began using IV fentanyl.  She reports having an overdose and then a stroke.  She  reports stopping fentanyl after that but then started using crack cocaine.  She reports concomitant marijuana use.  She reports that the cocaine use started 9 months ago and that prior she had several months of sobriety.  When asked why she uses drugs, the patient reports "I am bored and I do not feel like if in and I found friends with drugs".  The patient reports she feels like a burden on her family.  He denies experiencing serious depression recently.  She denies ever attempting suicide previously.  She reports occasional passive suicidal thoughts.  She denies experiencing suicidal thoughts recently.  ROS:  As above  Collateral information:  none  Psychiatric History:  Information collected from patient, EMR  Family psych history: none   Social History:  As above   Family History:  The patient's family history includes Anemia in her father; Healthy in her sister; Lung cancer in her maternal grandfather and paternal grandfather; Thyroid cancer in her maternal grandmother.  Medical History: Past Medical History:  Diagnosis Date   AKI (acute kidney injury) (HCC) 2018   "from overdose"   Anxiety    Chlamydia    Daily headache    Depression    Drug overdose 04/2016   [name] 05/01/2016   GERD (gastroesophageal reflux disease)    "when I was younger; gone now" (09/21/2017)   IV drug abuse (HCC) 09/20/2017   Migraine    "q couple weeks" (09/21/2017)   Opioid abuse (HCC)    Overdose    Stroke [facility])     Surgical History: Past Surgical History:  Procedure Laterality Date   APPENDECTOMY  04/2013   I & D EXTREMITY Left 09/20/2017   Procedure: IRRIGATION AND DEBRIDEMENT LEFT ARM;  Surgeon: Bradly Bienenstock, MD;  Location: [facility] OR;  Service: Orthopedics;  Laterality: Left;   TEE WITHOUT CARDIOVERSION N/A 02/10/2019   Procedure: TRANSESOPHAGEAL ECHOCARDIOGRAM (TEE);  Surgeon: Debbe Odea, MD;  Location: [facility] ORS;  Service: Cardiovascular;  Laterality: N/A;  Polysubstance abuser    TONGUE SURGERY  ~ 2007   "related to lisp"   TRACHEOSTOMY  04/2016   [name] 05/01/2016    Medications:   Current Facility-Administered Medications:    0.9 %  sodium chloride infusion, 250 mL, Intravenous, Continuous, Simonne Martinet, NP, Stopped at 01/07/22 1025   Place/Maintain arterial line, , , Until Discontinued **AND** 0.9 %  sodium chloride infusion, , Intra-arterial, PRN, Simonne Martinet, NP   acetaminophen (TYLENOL) tablet 650 mg, 650 mg, Oral, Q6H PRN, 650 mg at 01/08/22 2343 **OR** acetaminophen (TYLENOL) 160 MG/5ML solution 650 mg, 650 mg, Per Tube, Q6H PRN **OR** acetaminophen (TYLENOL) suppository 650 mg, 650 mg, Rectal, Q6H PRN, Merrilyn Puma, MD   Ampicillin-Sulbactam (UNASYN) 3 g in sodium chloride 0.9 % 100 mL IVPB, 3 g, Intravenous, Q8H, Mannam, Praveen, MD, Last Rate: 200 mL/hr at 01/09/22 0934, 3 g at 01/09/22 0934   baclofen (LIORESAL) tablet 10 mg, 10 mg, Oral, TID, Zannie Cove, MD, 10 mg at 01/09/22 0938   busPIRone (BUSPAR) tablet 10 mg, 10 mg, Oral, TID, Zannie Cove, MD, 10 mg at 01/09/22 3810   Chlorhexidine Gluconate Cloth 2 % PADS 6 each, 6 each, Topical, Daily, Kalman Shan, MD, 6 each at 01/09/22 [phone]  docusate sodium (COLACE) capsule 100 mg, 100 mg, Oral, BID, Francena Hanly, RPH, 100 mg at 01/08/22 0857   enoxaparin (LOVENOX) injection 40 mg, 40 mg, Subcutaneous, Q24H, Mannam, Praveen, MD, 40 mg at 01/09/22 0940   gabapentin (NEURONTIN) capsule 300 mg, 300 mg, Oral, QHS, Francena Hanly, RPH, 300 mg at 01/08/22 2117   lamoTRIgine (LAMICTAL) tablet 25 mg, 25 mg, Oral, BID, Francena Hanly, RPH, 25 mg at 01/09/22 1324   menthol-cetylpyridinium (CEPACOL) lozenge 3 mg, 1 lozenge, Oral, PRN, Zannie Cove, MD, 3 mg at 01/08/22 2120   mirtazapine (REMERON SOL-TAB) disintegrating tablet 15 mg, 15 mg, Oral, QHS, Zannie Cove, MD, 15 mg at 01/08/22 2128   nicotine (NICODERM CQ - dosed in mg/24 hours) patch 14 mg, 14 mg, Transdermal, Daily,  Zannie Cove, MD, 14 mg at 01/09/22 0935   ondansetron (ZOFRAN) injection 4 mg, 4 mg, Intravenous, Q6H PRN, Simonne Martinet, NP   OXcarbazepine (TRILEPTAL) tablet 150 mg, 150 mg, Oral, BID, Mannam, Praveen, MD, 150 mg at 01/09/22 0938   polyethylene glycol (MIRALAX / GLYCOLAX) packet 17 g, 17 g, Oral, Daily, Francena Hanly, RPH, 17 g at 01/08/22 0856   QUEtiapine (SEROQUEL) tablet 50 mg, 50 mg, Oral, Hessie Diener, MD, 50 mg at 01/08/22 2119  Allergies: Allergies  Allergen Reactions   Pristiq [Desvenlafaxine Succinate Er] Rash   Desvenlafaxine Rash   Other Rash    Tide detergent  Tide detergent  Tide detergent    Sulfa Antibiotics Rash       Objective  Vital signs:  Temp:  [98.2 F (36.8 C)-98.9 F (37.2 C)] 98.2 F (36.8 C) (11/30 0933) Pulse Rate:  [85-92] 86 (11/30 0933) Resp:  [15-17] 15 (11/30 0933) BP: (119-131)/(81-89) 119/89 (11/30 0933) SpO2:  [97 %-100 %] 99 % (11/30 0933) Weight:  [61.8 kg] 61.8 kg (11/30 0357)  Psychiatric Specialty Exam: Physical Exam Constitutional:      Appearance: the patient is not toxic-appearing.  Pulmonary:     Effort: Pulmonary effort is normal.  Neurological:     General: No focal deficit present.     Mental Status: the patient is alert and oriented to person, place, and time.   Review of Systems  Respiratory:  Negative for shortness of breath.   Cardiovascular:  Negative for chest pain.  Gastrointestinal:  Negative for abdominal pain, constipation, diarrhea, nausea and vomiting.  Neurological:  Negative for headaches.      BP 119/89 (BP Location: Left Arm)   Pulse 86   Temp 98.2 F (36.8 C) (Oral)   Resp 15   Ht 5\' 6"  (1.676 m)   Wt 61.8 kg   LMP 12/24/2021 (Within Days)   SpO2 99%   BMI 21.99 kg/m   General Appearance: Fairly Groomed  Eye Contact:  Good  Speech:  Clear and Coherent  Volume:  Normal  Mood:  Euthymic  Affect:  Congruent  Thought Process:  Coherent  Orientation:  Full (Time, Place,  and Person)  Thought Content: Logical   Suicidal Thoughts:  No  Homicidal Thoughts:  No  Memory:  Immediate;   Good  Judgement:  fair  Insight:  fair  Psychomotor Activity:  Normal  Concentration:  Concentration: Good  Recall:  Good  Fund of Knowledge: Good  Language: Good  Akathisia:  No  Handed:    AIMS (if indicated): not done  Assets:  Communication Skills Desire for Improvement Financial Resources/Insurance Housing Leisure Time Physical Health  ADL's:  Intact  Cognition:  WNL  Sleep:  Fair   Carlyn ReichertNick Keyra Virella, MD PGY-2

## 2022-01-09 NOTE — Care Management Important Message (Signed)
Important Message  Patient Details  Name: Jane Watkins MRN: [id number] Date of Birth: 1995-07-28   Medicare Important Message Given:  Yes     Jane Watkins 01/09/2022, 2:35 PM

## 2022-01-09 NOTE — TOC CAGE-AID Note (Signed)
Transition of Care [facility]) - CAGE-AID Screening   Patient Details  Name: Jane Watkins MRN: [id number] Date of Birth: [birth year]  Transition of [facility]) CM/SW Contact:    Janae Bridgeman, RN Phone Number: 01/09/2022, 3:09 PM   Clinical Narrative: CAGE assessment completed.  Psychiatry eval was placed by attending physician and is pending at this time.   CAGE-AID Screening:    Have You Ever Felt You Ought to Cut Down on Your Drinking or Drug Use?: Yes Have People Annoyed You By Critizing Your Drinking Or Drug Use?: Yes Have You Felt Bad Or Guilty About Your Drinking Or Drug Use?: Yes Have You Ever Had a Drink or Used Drugs First Thing In The Morning to Steady Your Nerves or to Get Rid of a Hangover?: Yes CAGE-AID Score: 4  Substance Abuse Education Offered: Yes  Substance abuse interventions: Referral to (must comment) (Psychiatry consult ordered by attending physician.)

## 2022-01-10 ENCOUNTER — Encounter: Payer: Medicare HMO | Admitting: Physical Medicine and Rehabilitation

## 2022-01-10 DIAGNOSIS — I469 Cardiac arrest, cause unspecified: Secondary | ICD-10-CM | POA: Diagnosis not present

## 2022-01-10 DIAGNOSIS — F1999 Other psychoactive substance use, unspecified with unspecified psychoactive substance-induced disorder: Secondary | ICD-10-CM | POA: Insufficient documentation

## 2022-01-10 LAB — CBC
HCT: 33.9 % — ABNORMAL LOW (ref 36.0–46.0)
Hemoglobin: 11.5 g/dL — ABNORMAL LOW (ref 12.0–15.0)
MCH: 30.2 pg (ref 26.0–34.0)
MCHC: 33.9 g/dL (ref 30.0–36.0)
MCV: 89 fL (ref 80.0–100.0)
Platelets: 254 10*3/uL (ref 150–400)
RBC: 3.81 MIL/uL — ABNORMAL LOW (ref 3.87–5.11)
RDW: 13.3 % (ref 11.5–15.5)
WBC: 6.4 10*3/uL (ref 4.0–10.5)
nRBC: 0 % (ref 0.0–0.2)

## 2022-01-10 LAB — COMPREHENSIVE METABOLIC PANEL
ALT: 307 U/L — ABNORMAL HIGH (ref 0–44)
AST: 24 U/L (ref 15–41)
Albumin: 2.9 g/dL — ABNORMAL LOW (ref 3.5–5.0)
Alkaline Phosphatase: 43 U/L (ref 38–126)
Anion gap: 5 (ref 5–15)
BUN: 11 mg/dL (ref 6–20)
CO2: 21 mmol/L — ABNORMAL LOW (ref 22–32)
Calcium: 8.7 mg/dL — ABNORMAL LOW (ref 8.9–10.3)
Chloride: 118 mmol/L — ABNORMAL HIGH (ref 98–111)
Creatinine, Ser: 0.72 mg/dL (ref 0.44–1.00)
GFR, Estimated: >60 mL/min (ref >60)
Glucose, Bld: 113 mg/dL — ABNORMAL HIGH (ref 70–99)
Potassium: 3.6 mmol/L (ref 3.5–5.1)
Sodium: 144 mmol/L (ref 135–145)
Total Bilirubin: 0.4 mg/dL (ref 0.3–1.2)
Total Protein: 5.5 g/dL — ABNORMAL LOW (ref 6.5–8.1)

## 2022-01-10 LAB — MAGNESIUM: Magnesium: 1.6 mg/dL — ABNORMAL LOW (ref 1.7–2.4)

## 2022-01-10 LAB — GLUCOSE, CAPILLARY
Glucose-Capillary: 110 mg/dL — ABNORMAL HIGH (ref 70–99)
Glucose-Capillary: 142 mg/dL — ABNORMAL HIGH (ref 70–99)
Glucose-Capillary: 82 mg/dL (ref 70–99)
Glucose-Capillary: 84 mg/dL (ref 70–99)
Glucose-Capillary: 86 mg/dL (ref 70–99)
Glucose-Capillary: 88 mg/dL (ref 70–99)

## 2022-01-10 MED ORDER — MAGNESIUM SULFATE 2 GM/50ML IV SOLN
2.0000 g | Freq: Once | INTRAVENOUS | Status: AC
  Administered 2022-01-10: 2 g via INTRAVENOUS
  Filled 2022-01-10: qty 50

## 2022-01-10 NOTE — Progress Notes (Addendum)
PROGRESS NOTE  Jane Watkins NID:[id number] DOB: 02-26-1995 DOA: 01/05/2022 PCP: Gweneth Dimitri, MD   LOS: 5 days   Brief Narrative / Interim history: 26 year old female with history of right MCA stroke with residual left-sided weakness, hypoxic encephalopathy following drug overdose in 2018 (required tracheostomy, had inpatient rehab), history of polysubstance abuse comes into the hospital after being found down, found pulseless and in asystole after overdose on crack cocaine and fentanyl.  She had CPR by EMTs, ROSC after 20 minutes, and was brought to the hospital.  She was in the ICU, eventually extubated 11/27 and transferred to the [facility] service on 11/29.  Subjective / 24h Interval events: She is doing well today.  Her father is at bedside.  She recalls me being in the room yesterday, and also recalls that her mother was visiting yesterday as well.  Assesement and Plan: Principal problem Acute hypoxic and toxic encephalopathy-following cardiac arrest due to fentanyl and cocaine overdose.  She has been extubated 11/27.  She remains alert, conversant, and has been having intermittent confusion which now seems better.  Yesterday, I saw her in the morning and afternoon, and in the afternoon she did not have any recollection that I was there in the morning.  Today she seems to remember that I was in the room yesterday, and also remembers her mom, and this suggests improvement.  Active problems Disposition-patient was in a rehab facility for substance abuse in [place], however about a week and a half ago left AMA.  She went to live with her dad, however is working a lot of time, and then this happened with her overdose.  Parents want patient to go straight into a drug rehab, social worker consulted, however they do understand that this is not feasibly possible.  She is approaching medical readiness and could potentially leave the hospital this weekend.  The mother plans to take the patient to live  with her, she is at home 24/7 and is working from home.  Patient's father (parents are separated) is in agreement with this.  Patient however is inclining to do that, however not decided yet, she needs to speak with her mom.  Long history of drug use, PTSD, depression-continue home medications.  Psychiatry consulted did not recommend inpatient psych, but they will reevaluate her today.  Patient's mother mentions over the phone that she feels like her daughter is suicidal and has expressed wishes for self-harm in the past.  Acute hypoxic respiratory failure, aspiration pneumonia -Secondary to overdose and aspiration pneumonia, complete 5 days of Unasyn, today is the last day.  Currently on room air, improving  History of CVA with spasticity -In the background of drug overdose in 2018, continue home medications.  Follows with PMR regularly as an outpatient.  She worked well with PT, no PT follow-up  Severe metabolic acidosis-Resolved   Shock liver -Improving, will trend   Hypoglycemia -Likely secondary to cardiac arrest and aspiration pneumonia, now improved   History of seizures -Continue Lamictal, Tegretol  Hypomagnesemia-replace magnesium today  Scheduled Meds:  baclofen  10 mg Oral TID   busPIRone  10 mg Oral TID   Chlorhexidine Gluconate Cloth  6 each Topical Daily   docusate sodium  100 mg Oral BID   enoxaparin (LOVENOX) injection  40 mg Subcutaneous Q24H   gabapentin  300 mg Oral QHS   lamoTRIgine  25 mg Oral BID   mirtazapine  15 mg Oral QHS   nicotine  14 mg Transdermal Daily   OXcarbazepine  150 mg Oral BID   polyethylene glycol  17 g Oral Daily   QUEtiapine  50 mg Oral QHS   Continuous Infusions:  sodium chloride Stopped (01/07/22 1025)   sodium chloride     ampicillin-sulbactam (UNASYN) IV 3 g (01/10/22 0906)   magnesium sulfate bolus IVPB     PRN Meds:.Place/Maintain arterial line **AND** sodium chloride, acetaminophen **OR** acetaminophen (TYLENOL) oral liquid 160  mg/5 mL **OR** acetaminophen, menthol-cetylpyridinium, ondansetron (ZOFRAN) IV  Current Outpatient Medications  Medication Instructions   acetaminophen (TYLENOL) 650 mg, Oral, Every 4 hours PRN   atorvastatin (LIPITOR) 20 mg, Oral, Daily-1800   baclofen (LIORESAL) 10 mg, Oral, 3 times daily   busPIRone (BUSPAR) 10 MG tablet TAKE 1 TABLET BY MOUTH THREE TIMES A DAY   gabapentin (NEURONTIN) 300 mg, Oral, Daily at bedtime   lamoTRIgine (LAMICTAL) 50 mg, Oral, Daily   mirtazapine (REMERON) 15 mg, Oral, Daily at bedtime   naloxone (NARCAN) nasal spray 4 mg/0.1 mL ADMINISTER A SINGLE SPRAY IN ONE NOSTRIL UPON SIGNS OF OPIOID OVERDOSE. REPEAT EVERY 2 - 3 MINUTES IF NO RESPONSE IN ALTERNATING NOSTRIL TIL EMS ARRIVES.   nicotine (NICODERM CQ - DOSED IN MG/24 HOURS) 21 mg, Transdermal, Daily   nitrofurantoin (macrocrystal-monohydrate) (MACROBID) 100 mg, Oral, 2 times daily   OXcarbazepine (TRILEPTAL) 150 mg, Oral, 2 times daily   propranolol (INDERAL) 10 mg, Oral, Daily PRN   QUEtiapine (SEROQUEL) 50 mg, Oral, Daily at bedtime   spironolactone (ALDACTONE) 50 mg, Oral, 2 times daily   SUMAtriptan (IMITREX) 50 MG tablet Take 1 tablet by mouth every 2 hours as needed for migraine. May repeat in 2 hours if headache persists or recurs.   tiZANidine (ZANAFLEX) 4 mg, Oral, 3 times daily    Diet Orders (From admission, onward)     Start     Ordered   01/10/22 1124  Diet regular Room service appropriate? Yes; Fluid consistency: Nectar Thick  Diet effective now       Question Answer Comment  Room service appropriate? Yes   Fluid consistency: Nectar Thick      01/10/22 1123            DVT prophylaxis: enoxaparin (LOVENOX) injection 40 mg Start: 01/06/22 0930 SCDs Start: 01/05/22 1405   Lab Results  Component Value Date   PLT 254 01/10/2022      Code Status: Full Code  Family Communication: no family at bedside   Status is: Inpatient  Remains inpatient appropriate because: Persistent  confusion  Level of care: Med-Surg  Consultants:  PCCM Psychiatry   Objective: Vitals:   01/09/22 1545 01/09/22 2106 01/10/22 0424 01/10/22 0825  BP: 121/84 (!) 124/91 127/78 117/79  Pulse: 83 85 80 66  Resp: Temp: 98.5 F (36.9 C) 99.1 F (37.3 C) 98.4 F (36.9 C) 97.7 F (36.5 C)  TempSrc: Oral Oral Oral Oral  SpO2: 100% 100% 98% 98%  Weight:      Height:       No intake or output data in the 24 hours ending 01/10/22 1126  Wt Readings from Last 3 Encounters:  01/09/22 61.8 kg  10/15/21 57.6 kg  10/11/21 54.9 kg    Examination:  Constitutional: NAD Eyes: lids and conjunctivae normal, no scleral icterus ENMT: mmm Neck: normal, supple Respiratory: clear to auscultation bilaterally, no wheezing, no crackles. Normal respiratory effort.  Cardiovascular: Regular rate and rhythm, no murmurs / rubs / gallops. No LE edema. Abdomen: soft, no distention, no tenderness.  Bowel sounds positive.  Skin: no rashes Neurologic: No new focal deficits, left arm spasticity noted  Data Reviewed: I have independently reviewed following labs and imaging studies   CBC Recent Labs  Lab 01/05/22 1230 01/05/22 1247 01/06/22 0415 01/06/22 0501 01/07/22 0719 01/07/22 0931 01/07/22 1122 01/08/22 0640 01/10/22 0606  WBC 13.0*  --  13.9*  --  10.0  --   --  7.4 6.4  HGB 10.1*   < > 12.1 11.9* 10.7*  --   --  12.0 11.5*  HCT 41.9   < > 36.4 35.0* 33.7*  --   --  35.6* 33.9*  PLT 291  --  241  --  212 SPECIMEN CLOTTED 205 220 254  MCV 127.4*  --  88.8  --  91.6  --   --  89.0 89.0  MCH 30.7  --  29.5  --  29.1  --   --  30.0 30.2  MCHC 24.1*  --  33.2  --  31.8  --   --  33.7 33.9  RDW 13.4  --  13.1  --  13.1  --   --  12.8 13.3   < > = values in this interval not displayed.     Recent Labs  Lab 01/05/22 1230 01/05/22 1247 01/06/22 0038 01/06/22 0415 01/06/22 0501 01/06/22 1716 01/07/22 0446 01/07/22 0931 01/07/22 0953 01/08/22 0640 01/09/22 0816  01/10/22 0606  NA 132*   < >  --  139   < > 140 138  --   --  138 147* 144  K 4.4   < >  --  3.2*   < > 4.6 4.0  --   --  3.3* 3.8 3.6  CL 100  --   --  105   < > 108 110  --   --  108 117* 118*  CO2 10*  --   --  25   < > 24 22  --   --  22 23 21*  GLUCOSE >1,200*  --   --  142*   < > 76 78  --   --  93 90 113*  BUN 19  --   --  19   < > 13 9  --   --  <5* <5* 11  CREATININE 1.85*  --   --  0.93   < > 0.70 0.62  --   --  0.63 0.65 0.72  CALCIUM 7.6*  --   --  8.2*   < > 8.2* 8.1*  --   --  8.2* 8.7* 8.7*  AST 828*  --   --  1,598*  --  742*  --  385*  --  132* 44* 24  ALT 717*  --   --  1,656*  --  1,251*  --  1,021*  --  781* 509* 307*  ALKPHOS 52  --   --  49  --  48  --  54  --  55 50 43  BILITOT 1.3*  --   --  1.2  --  1.3*  --  1.4*  --  1.4* 0.8 0.4  ALBUMIN 3.2*  --   --  3.1*  --  3.0*  --  3.0*  --  3.2* 3.2* 2.9*  MG 2.8*  --   --   --   --   --   --   --   --   --   --  1.6*  DDIMER  --   --   --   --   --   --   --  2.15*  --   --   --   --   PROCALCITON <0.10  --   --  7.78  --   --   --   --   --   --   --   --   LATICACIDVEN >9.0*  --  3.5*  --   --   --   --   --  1.1  --   --   --   INR 1.5*  --   --   --   --   --   --  1.5*  --  1.2  --   --    < > = values in this interval not displayed.     ------------------------------------------------------------------------------------------------------------------ No results for input(s): "CHOL", "HDL", "LDLCALC", "TRIG", "CHOLHDL", "LDLDIRECT" in the last 72 hours.  Lab Results  Component Value Date   HGBA1C 5.1 11/05/2020   ------------------------------------------------------------------------------------------------------------------ No results for input(s): "TSH", "T4TOTAL", "T3FREE", "THYROIDAB" in the last 72 hours.  Invalid input(s): "FREET3"  Cardiac Enzymes No results for input(s): "CKMB", "TROPONINI", "MYOGLOBIN" in the last 168 hours.  Invalid input(s):  "CK" ------------------------------------------------------------------------------------------------------------------ No results found for: "BNP"  CBG: Recent Labs  Lab 01/09/22 1142 01/09/22 1546 01/09/22 2217 01/10/22 0029 01/10/22 0819  GLUCAP 111* 115* 139* 142* 84     Recent Results (from the past 240 hour(s))  Urine Culture     Status: None   Collection Time: 01/05/22  4:17 PM   Specimen: Urine, Catheterized  Result Value Ref Range Status   Specimen Description URINE, CATHETERIZED  Final   Special Requests NONE  Final   Culture   Final    NO GROWTH Performed at [facility], [address] [place], [place] [address]    Report Status 01/06/2022 FINAL  Final  MRSA Next Gen by PCR, Nasal     Status: None   Collection Time: 01/05/22  4:29 PM   Specimen: Nasal Mucosa; Nasal Swab  Result Value Ref Range Status   MRSA by PCR Next Gen NOT DETECTED NOT DETECTED Final    Comment: (NOTE) The GeneXpert MRSA Assay (FDA approved for NASAL specimens only), is one component of a comprehensive MRSA colonization surveillance program. It is not intended to diagnose MRSA infection nor to guide or monitor treatment for MRSA infections. Test performance is not FDA approved in patients less than 26 years old. Performed at [facility], [address] [place], [place] [address]   Culture, blood (Routine X 2) w Reflex to ID Panel     Status: None (Preliminary result)   Collection Time: 01/06/22  9:02 AM   Specimen: BLOOD LEFT ARM  Result Value Ref Range Status   Specimen Description BLOOD LEFT ARM  Final   Special Requests IN PEDIATRIC BOTTLE Blood Culture adequate volume  Final   Culture   Final    NO GROWTH 4 DAYS Performed at [facility], [address] [place], [place] [address]    Report Status PENDING  Incomplete  Culture, blood (Routine X 2) w Reflex to ID Panel     Status: None (Preliminary result)   Collection Time: 01/06/22  9:02 AM   Specimen:  BLOOD LEFT ARM  Result Value Ref Range Status   Specimen Description BLOOD LEFT ARM  Final   Special Requests   Final  BOTTLES DRAWN AEROBIC ONLY Blood Culture results may not be optimal due to an inadequate volume of blood received in culture bottles   Culture   Final    NO GROWTH 4 DAYS Performed at [facility], [address], [place], [place] [address]    Report Status PENDING  Incomplete     Radiology Studies: No results found.   Pamella Pert, MD, PhD [facility]  Between 7 am - 7 pm I am available, please contact me via Amion (for emergencies) or Securechat (non urgent messages)  Between 7 pm - 7 am I am not available, please contact night coverage MD/APP via Amion

## 2022-01-10 NOTE — Consult Note (Addendum)
[facility] New Face-to-Face Psychiatric Evaluation   Service Date: January 10, 2022 LOS:  LOS: 5 days    Assessment  Jane Watkins is a 26 year old female with a past psychiatric history of polysubstance use and IV drug use.  She has a past medical history that is notable for multiple overdoses resulting in acute inpatient rehab admissions.  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  She has no previous psychiatric admissions.  She was medically admitted on 11/26 for cardiac arrest.  According to previous notes, a bystander called EMS after she was unresponsive and they reported an overdose on fentanyl and crack cocaine.  CPR was performed for 20 minutes before ROSC was obtained.  Since that time the patient's mental status has gradually improved.  Psychiatry was consulted for potential self-harm by Dr. Letta Median.  12/1 Collected collateral from the patient's father and mother, numbers listed in the chart.  The patient has been residing with her father for the past 1.5 years.  He reports that the patient made a statement about a possible suicide attempt 2 weeks ago.  He is very concerned about the patient's mental wellbeing and feels that he cannot keep her safe at home.  Both the patient's father and her mother state that they are very concerned about the patient eloping.  The patient's mother reports that the patient sent her a text message saying "I never wanted to hurt anybody but myself" the day of the ingestion that led to her cardiac arrest.  She states that the patient told many family members recently that she does not want to be alive.  She says this is a new development.  She feels that the patient's ingestion was a suicide attempt.  She feels the patient cannot be Safe at her home or the patient's father's home.  Given this information, involuntary commitment paperwork is being filled out and faxed to the magistrate.  The patient was informed  of the involuntary commitment.  She asked appropriate questions about the [facility] which this provider answered.  Should not be allowed to leave the hospital.  Will seek behavioral health inpatient placement when medically ready.    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 This decision is based on my review of the chart including patient's history and current presentation, interview of the patient, mental status examination, and consideration of suicide risk including evaluating suicidal ideation, plan, intent, suicidal or self-harm behaviors, risk factors, and protective factors. This judgment is based on our ability to directly address suicide risk, implement suicide prevention strategies and develop a safety plan while the patient is in the clinical setting. Please contact our team if there is a concern that risk level has changed.   ## Medications:  -Continue home baclofen as prescribed - Continue home gabapentin as prescribed - Continue home lamotrigine as prescribed - Continue home Trileptal as prescribed - Continue Remeron 15 mg nightly, new medication? - Continue home Seroquel as prescribed   ## Medical Decision Making Capacity:  Not assessed on this encounter  ## Further Work-up:  Per  primary  ## Disposition:  TBD  ## Behavioral / Environmental:  -- high risk of self-harm, 1:1 needed  ##Legal Status INVOLUNTARY  Thank you for this consult request. Recommendations have been communicated to the primary team.  We will follow at this time.   Corky Sox, MD   NEW history  Relevant Aspects of Hospital Course:   Admitted on 01/05/2022 for Cardiac arrest.  Patient Report:  Mental status exam is notable for the patient being fully alert and oriented with intact attention on testing.  She reports using drugs starting at 26 years old when she began using IV fentanyl.  She reports having an overdose and then a stroke.  She reports stopping fentanyl after that but then started using crack cocaine.  She reports concomitant marijuana use.  She reports that the cocaine use started 9 months ago and that prior she had several months of sobriety.  When asked why she uses drugs, the patient reports "I am bored and I do not feel like if in and I found friends with drugs".  The patient reports she feels like a burden on her family.  He denies experiencing serious depression recently.  She denies ever attempting suicide previously.  She reports occasional passive suicidal thoughts.  She denies experiencing suicidal thoughts recently.  12/1 The patient states that it is good to meet this provider.  I informed her that we met yesterday and spoke at length.  She does not remember this conversation.  Discussed with her the collateral that her parents provided and told her about the necessity of involuntary commitment.  She expressed understanding.  She asked appropriate questions about the behavioral health hospital which were answered.  She denies experiencing any suicidal or homicidal thoughts.  She denies experiencing auditory or visual hallucinations.  ROS:  As above  Collateral information:  none  Psychiatric History:  Information collected from patient, EMR  Family psych history: none   Social History:  As above   Family History:  The patient's family history includes Anemia in her father; Healthy in her sister; Lung cancer in her maternal grandfather and paternal grandfather; Thyroid cancer in her maternal grandmother.  Medical History: Past Medical History:  Diagnosis Date   AKI (acute kidney injury)  ([place]) 2018   "from overdose"   Anxiety    Chlamydia    Daily headache    Depression    Drug overdose 04/2016   [name] 05/01/2016   GERD (gastroesophageal reflux disease)    "when I was younger; gone now" (09/21/2017)   IV drug abuse ([place]) 09/20/2017   Migraine    "q couple weeks" (09/21/2017)   Opioid abuse ([place])    Overdose    Stroke [facility])     Surgical History: Past Surgical History:  Procedure Laterality Date   APPENDECTOMY  04/2013   I & D EXTREMITY Left 09/20/2017   Procedure: IRRIGATION AND DEBRIDEMENT LEFT ARM;  Surgeon: [name], MD;  Location: [place];  Service: Orthopedics;  Laterality: Left;   TEE WITHOUT CARDIOVERSION N/A 02/10/2019   Procedure: TRANSESOPHAGEAL ECHOCARDIOGRAM (TEE);  Surgeon: [name], MD;  Location: [facility] ORS;  Service: Cardiovascular;  Laterality: N/A;  Polysubstance abuser   TONGUE SURGERY  ~ 2007   "related to lisp"   TRACHEOSTOMY  04/2016   [name] 05/01/2016    Medications:   Current Facility-Administered Medications:    0.9 %  sodium chloride infusion, 250 mL, Intravenous, Continuous, Erick Colace, NP, Stopped at 01/07/22 1025   Place/Maintain  arterial line, , , Until Discontinued **AND** 0.9 %  sodium chloride infusion, , Intra-arterial, PRN, Erick Colace, NP   acetaminophen (TYLENOL) tablet 650 mg, 650 mg, Oral, Q6H PRN, 650 mg at 01/10/22 0437 **OR** acetaminophen (TYLENOL) 160 MG/5ML solution 650 mg, 650 mg, Per Tube, Q6H PRN **OR** acetaminophen (TYLENOL) suppository 650 mg, 650 mg, Rectal, Q6H PRN, Virl Axe, MD   Ampicillin-Sulbactam (UNASYN) 3 g in sodium chloride 0.9 % 100 mL IVPB, 3 g, Intravenous, Q8H, Mannam, Praveen, MD, Last Rate: 200 mL/hr at 01/10/22 0906, 3 g at 01/10/22 0906   baclofen (LIORESAL) tablet 10 mg, 10 mg, Oral, TID, Domenic Polite, MD, 10 mg at 01/10/22 0901   busPIRone (BUSPAR) tablet 10 mg, 10 mg, Oral, TID, Domenic Polite, MD, 10 mg at 01/10/22 0901   Chlorhexidine Gluconate Cloth 2 % PADS  6 each, 6 each, Topical, Daily, Ramaswamy, Murali, MD, 6 each at 01/09/22 0939   docusate sodium (COLACE) capsule 100 mg, 100 mg, Oral, BID, Ventura Sellers, RPH, 100 mg at 01/10/22 0901   enoxaparin (LOVENOX) injection 40 mg, 40 mg, Subcutaneous, Q24H, Mannam, Praveen, MD, 40 mg at 01/10/22 0903   gabapentin (NEURONTIN) capsule 300 mg, 300 mg, Oral, QHS, Ventura Sellers, RPH, 300 mg at 01/09/22 2156   lamoTRIgine (LAMICTAL) tablet 25 mg, 25 mg, Oral, BID, Ventura Sellers, RPH, 25 mg at 01/10/22 0901   menthol-cetylpyridinium (CEPACOL) lozenge 3 mg, 1 lozenge, Oral, PRN, Domenic Polite, MD, 3 mg at 01/08/22 2120   mirtazapine (REMERON SOL-TAB) disintegrating tablet 15 mg, 15 mg, Oral, QHS, Domenic Polite, MD, 15 mg at 01/09/22 2208   nicotine (NICODERM CQ - dosed in mg/24 hours) patch 14 mg, 14 mg, Transdermal, Daily, Domenic Polite, MD, 14 mg at 01/10/22 0903   ondansetron (ZOFRAN) injection 4 mg, 4 mg, Intravenous, Q6H PRN, Erick Colace, NP   OXcarbazepine (TRILEPTAL) tablet 150 mg, 150 mg, Oral, BID, Mannam, Praveen, MD, 150 mg at 01/10/22 0901   polyethylene glycol (MIRALAX / GLYCOLAX) packet 17 g, 17 g, Oral, Daily, Ventura Sellers, RPH, 17 g at 01/10/22 0901   QUEtiapine (SEROQUEL) tablet 50 mg, 50 mg, Oral, Arliss Journey, MD, 50 mg at 01/09/22 2156  Allergies: Allergies  Allergen Reactions   Pristiq [Desvenlafaxine Succinate Er] Rash   Desvenlafaxine Rash   Other Rash    Tide detergent  Tide detergent  Tide detergent    Sulfa Antibiotics Rash       Objective  Vital signs:  Temp:  [97.7 F (36.5 C)-99.1 F (37.3 C)] 98.4 F (36.9 C) (12/01 1550) Pulse Rate:  [66-85] 80 (12/01 1550) Resp:  [16-18] 18 (12/01 1550) BP: (117-138)/(78-95) 138/95 (12/01 1550) SpO2:  [98 %-100 %] 100 % (12/01 1550)  Psychiatric Specialty Exam: Physical Exam Constitutional:      Appearance: the patient is not toxic-appearing.  Pulmonary:     Effort: Pulmonary effort is  normal.  Neurological:     General: No focal deficit present.     Mental Status: the patient is alert and oriented to person, place, and time.   Review of Systems  Respiratory:  Negative for shortness of breath.   Cardiovascular:  Negative for chest pain.  Gastrointestinal:  Negative for abdominal pain, constipation, diarrhea, nausea and vomiting.  Neurological:  Negative for headaches.      BP (!) 138/95 (BP Location: Right Arm)   Pulse 80   Temp 98.4 F (36.9 C) (Oral)   Resp 18   Ht  _0  (1.676 m)   Wt 61.8 kg   LMP 12/24/2021 (Within Days)   SpO2 100%   BMI 21.99 kg/m   General Appearance: Fairly Groomed  Eye Contact:  Good  Speech:  Clear and Coherent  Volume:  Normal  Mood:  Euthymic  Affect:  Congruent  Thought Process:  Coherent  Orientation:  Full (Time, Place, and Person)  Thought Content: Logical   Suicidal Thoughts:  denies  Homicidal Thoughts:  denies  Memory:  poor  Judgement:  poor  Insight:  poor  Psychomotor Activity:  Normal  Concentration:  Concentration: Good  Recall:  poor  Fund of Knowledge: Good  Language: Good  Akathisia:  No  Handed:    AIMS (if indicated): not done  Assets:  Communication Skills Desire for Improvement Financial Resources/Insurance Housing Leisure Time Physical Health  ADL's:  Intact  Cognition: WNL  Sleep:  Fair   Corky Sox, MD PGY-2

## 2022-01-10 NOTE — Progress Notes (Signed)
Patient was placed on 1:1 level of observation due to provider placing patient on an involuntary hold. Patient as well as father was explained the change in patient status. Sitter remains in room with patient

## 2022-01-10 NOTE — Progress Notes (Incomplete)
[facility] Followup Face-to-Face Psychiatric Evaluation   Service Date: January 10, 2022 LOS:  LOS: 5 days    Assessment  Jane Watkins is a 26 year old female with a past psychiatric history of polysubstance use and IV drug use.  She has a past medical history that is notable for multiple overdoses resulting in acute inpatient rehab admissions.  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  She has no previous psychiatric admissions.  She was medically admitted on 11/26 for cardiac arrest.  According to previous notes, a bystander called EMS after she was unresponsive and they reported an overdose on fentanyl and crack cocaine.  CPR was performed for 20 minutes before ROSC was obtained.  Since that time the patient's mental status has gradually improved.  Psychiatry was consulted for potential self-harm by Dr. Wyonia Hough.   Regarding the consultation question, there is no evidence that the patient intentionally took an overdose with the intention of harming herself or ending her life. She reports that she was smoking crack cocaine and feels it must have been laced with fentanyl. An explanation that appears reasonable. She reports recently leaving rehab "because I wanted to get high" but denies major depression or recent suicidal thoughts. Her uncontrolled substance use is certainly concerning and warrants treatment, especially given the toll it has taken on her physical health. No acute safety concerns, patient does not meet criteria for inpatient hospitalization.     The patient reports an interest in attaining abstinence from drugs. She resides in [place] and has [employer], making her eligible for rehab (e.g. [facility]) or CDIOP at [facility] She is considering these options presently. She would also benefit from regular therapy if this can be arranged. Can discuss with LCSW. Will continue to consider medication changes.    Further medication  adjustments can be made. Ordered repeat EKG given long Qtc.   Diagnoses:  Active Hospital problems: Principal Problem:   Cardiac arrest [facility])     Plan  ## Safety and Observation Level:  - Based on my clinical evaluation, I estimate the patient to be at low risk of self harm in the current setting - At this time, we recommend a routine level of observation. This decision is based on my review of the chart including patient's history and current presentation, interview of the patient, mental status examination, and consideration of suicide risk including evaluating suicidal ideation, plan, intent, suicidal or self-harm behaviors, risk factors, and protective factors. This judgment is based on our ability to directly address suicide risk, implement suicide prevention strategies and develop a safety plan while the patient is in the clinical setting. Please contact our team if there is a concern that risk level has changed.   ## Medications:  -Continue home baclofen as prescribed - Continue home gabapentin as prescribed - Continue home lamotrigine as prescribed - Continue home Trileptal as prescribed - Continue Remeron 15 mg nightly, new medication? - Continue home Seroquel as prescribed  ## Medical Decision Making Capacity:  Not assessed on this encounter   ## Further Work-up:  -- Per primary  -- most recent EKG on 11/30 had QtC of 412  ## Disposition:  -- TBD   ## Behavioral / Environmental:  -- Routine obs   ##Legal Status Voluntary  Thank you for this consult request. Recommendations have been communicated to the primary team.  We will follow at this time.   Karie Fetch, MD, PGY-1   Followup  history  Relevant Aspects of Hospital Course:  Admitted on 01/05/2022 for Cardiac arrest .  Patient Report:  ***  ROS:  ***  Collateral information:  ***  Psychiatric History:  Information collected from ***  Family psych history: ***   Social History:  {Social  History SELECT ALL THAT [id number]}  Tobacco use: *** Alcohol use: *** Drug use: ***  Family History:  {[id number]:[id number]} The patient's family history includes Anemia in her father; Healthy in her sister; Lung cancer in her maternal grandfather and paternal grandfather; Thyroid cancer in her maternal grandmother.  Medical History: Past Medical History:  Diagnosis Date   AKI (acute kidney injury) ([facility]) 2018   "from overdose"   Anxiety    Chlamydia    Daily headache    Depression    Drug overdose 04/2016   [name] 05/01/2016   GERD (gastroesophageal reflux disease)    "when I was younger; gone now" (09/21/2017)   IV drug abuse ([facility]) 09/20/2017   Migraine    "q couple weeks" (09/21/2017)   Opioid abuse ([facility])    Overdose    Stroke [facility])     Surgical History: Past Surgical History:  Procedure Laterality Date   APPENDECTOMY  04/2013   I & D EXTREMITY Left 09/20/2017   Procedure: IRRIGATION AND DEBRIDEMENT LEFT ARM;  Surgeon: Bradly Bienenstock, MD;  [facility];  Service: Orthopedics;  Laterality: Left;   TEE WITHOUT CARDIOVERSION N/A 02/10/2019   Procedure: TRANSESOPHAGEAL ECHOCARDIOGRAM (TEE);  Surgeon: Debbe Odea, MD;  Location: [facility] ORS;  Service: Cardiovascular;  Laterality: N/A;  Polysubstance abuser   TONGUE SURGERY  ~ 2007   "related to lisp"   TRACHEOSTOMY  04/2016   [name] 05/01/2016    Medications:   Current Facility-Administered Medications:    0.9 %  sodium chloride infusion, 250 mL, Intravenous, Continuous, Simonne Martinet, NP, Stopped at 01/07/22 1025   Place/Maintain arterial line, , , Until Discontinued **AND** 0.9 %  sodium chloride infusion, , Intra-arterial, PRN, Simonne Martinet, NP   acetaminophen (TYLENOL) tablet 650 mg, 650 mg, Oral, Q6H PRN, 650 mg at 01/10/22 0437 **OR** acetaminophen (TYLENOL) 160 MG/5ML solution 650 mg, 650 mg, Per Tube, Q6H PRN **OR** acetaminophen (TYLENOL) suppository 650 mg, 650 mg, Rectal, Q6H PRN, Merrilyn Puma, MD    Ampicillin-Sulbactam (UNASYN) 3 g in sodium chloride 0.9 % 100 mL IVPB, 3 g, Intravenous, Q8H, Mannam, Praveen, MD, Last Rate: 200 mL/hr at 01/10/22 0004, 3 g at 01/10/22 0004   baclofen (LIORESAL) tablet 10 mg, 10 mg, Oral, TID, Zannie Cove, MD, 10 mg at 01/09/22 2157   busPIRone (BUSPAR) tablet 10 mg, 10 mg, Oral, TID, Zannie Cove, MD, 10 mg at 01/09/22 2157   Chlorhexidine Gluconate Cloth 2 % PADS 6 each, 6 each, Topical, Daily, Ramaswamy, Murali, MD, 6 each at 01/09/22 0939   docusate sodium (COLACE) capsule 100 mg, 100 mg, Oral, BID, Francena Hanly, RPH, 100 mg at 01/08/22 0857   enoxaparin (LOVENOX) injection 40 mg, 40 mg, Subcutaneous, Q24H, Mannam, Praveen, MD, 40 mg at 01/09/22 0940   gabapentin (NEURONTIN) capsule 300 mg, 300 mg, Oral, QHS, Francena Hanly, RPH, 300 mg at 01/09/22 2156   lamoTRIgine (LAMICTAL) tablet 25 mg, 25 mg, Oral, BID, Francena Hanly, RPH, 25 mg at 01/09/22 2208   menthol-cetylpyridinium (CEPACOL) lozenge 3 mg, 1 lozenge, Oral, PRN, Zannie Cove, MD, 3 mg at 01/08/22 2120   mirtazapine (REMERON SOL-TAB) disintegrating tablet 15 mg, 15 mg, Oral, QHS, Zannie Cove, MD, 15  mg at 01/09/22 2208   nicotine (NICODERM CQ - dosed in mg/24 hours) patch 14 mg, 14 mg, Transdermal, Daily, Zannie Cove, MD, 14 mg at 01/08/22 1556   ondansetron (ZOFRAN) injection 4 mg, 4 mg, Intravenous, Q6H PRN, Simonne Martinet, NP   OXcarbazepine (TRILEPTAL) tablet 150 mg, 150 mg, Oral, BID, Mannam, Praveen, MD, 150 mg at 01/09/22 2206   polyethylene glycol (MIRALAX / GLYCOLAX) packet 17 g, 17 g, Oral, Daily, Francena Hanly, RPH, 17 g at 01/08/22 0856   QUEtiapine (SEROQUEL) tablet 50 mg, 50 mg, Oral, [place] Diener, MD, 50 mg at 01/09/22 2156  Allergies: Allergies  Allergen Reactions   Pristiq [Desvenlafaxine Succinate Er] Rash   Desvenlafaxine Rash   Other Rash    Tide detergent  Tide detergent  Tide detergent    Sulfa Antibiotics Rash        Objective  Vital signs:  Temp:  [98.2 F (36.8 C)-99.1 F (37.3 C)] 98.4 F (36.9 C) (12/01 0424) Pulse Rate:  [80-86] 80 (12/01 0424) Resp:  [15-17] 17 (12/01 0424) BP: (119-127)/(78-91) 127/78 (12/01 0424) SpO2:  [98 %-100 %] 98 % (12/01 0424)  Psychiatric Specialty Exam:  Presentation  General Appearance:  Appropriate for Environment  Eye Contact: Good  Speech: Clear and Coherent; Normal Rate  Speech Volume: Normal  Handedness: Right   Mood and Affect  Mood: Euthymic  Affect: Appropriate; Congruent   Thought Process  Thought Processes: Coherent; Goal Directed  Descriptions of Associations:Intact  Orientation:Full (Time, Place and Person)  Thought Content:No data recorded History of Schizophrenia/Schizoaffective disorder:No data recorded Duration of Psychotic Symptoms:No data recorded Hallucinations:No data recorded Ideas of Reference:None  Suicidal Thoughts:No data recorded Homicidal Thoughts:No data recorded  Sensorium  Memory: Immediate Good; Recent Good  Judgment: Intact  Insight: Present   Executive Functions  Concentration: Good  Attention Span: Good  Recall: Good  Fund of Knowledge: Good  Language: Good   Psychomotor Activity  Psychomotor Activity:No data recorded  Assets  Assets: Communication Skills; Desire for Improvement; Housing; Health and safety inspector; Social Support; Physical Health   Sleep  Sleep:No data recorded   Physical Exam: Physical Exam ROS Blood pressure 127/78, pulse 80, temperature 98.4 F (36.9 C), temperature source Oral, resp. rate 17, height 5\' 6"  (1.676 m), weight 61.8 kg, last menstrual period 12/24/2021, SpO2 98 %. Body mass index is 21.99 kg/m.

## 2022-01-10 NOTE — Progress Notes (Signed)
Speech Language Pathology Treatment: Dysphagia  Patient Details Name: Delany Steury MRN: [id number] DOB: [birth year] Today's Date: 01/10/2022 Time: 5732-2025 SLP Time Calculation (min) (ACUTE ONLY): 18 min  Assessment / Plan / Recommendation Clinical Impression  Per notes, observation and family, her cognition has significantly improved. She did not recall having MBS Wed. Today she is alert, responding appropriately, repositioned herself and accurately recalling events from yesterday. She consumed multiple and sequential straw sips nectar timely, appeared well coordinated without signs of airway intrusion. Regular solids were well masticated without difficulty.  During MBS pt had sensed aspiration with immediate cough therefore, thin liquids trialed at bedside. RN received report that she was given thin liquids last night and was coughing. Multiple trials with various presentations given. No coughing with single sips cup or straw. When asked to take multiple sequential sips her eyes became red/watery with immediate cough. Given that she has been impulsive, RN report of coughing with thin and clinical observation,  recommend continue nectar thick and therapist will continue to follow for upgrade to thin liquids at bedside versus repeat MBS if needed. SLP upgraded texture to regular, continue pills whole in puree. Pt has order to allow thin water (only water) after oral care, in between meals under supervision.    HPI HPI: Patient is a 26 y.o. 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 She presented to the ED on 01/05/22 with witnessed overdose of crack cocaine and fentanyl. She was found pulseless and in asystole; downtime unclear, no bystander CPR, time to ROSC estimated at 20 minutes, no response to Narcan. She was changed from [place] to ETT for airway. CT head ordered but did not show any  acute changes.  PCXR showed lungs to be clear, no infiltrate. She was extubated to supplemental oxygen via nasal cannula on 01/06/22. She failed [facility] with nursing and is NPO awaiting SLP evaluation.      SLP Plan  Continue with current plan of care      Recommendations for follow up therapy are one component of a multi-disciplinary discharge planning process, led by the attending physician.  Recommendations may be updated based on patient status, additional functional criteria and insurance authorization.    Recommendations  Diet recommendations: Regular;Nectar-thick liquid Liquids provided via: Cup;Straw Medication Administration: Whole meds with puree Supervision: Patient able to self feed;Intermittent supervision to cue for compensatory strategies Compensations: Minimize environmental distractions;Slow rate;Small sips/bites Postural Changes and/or Swallow Maneuvers: Seated upright 90 degrees                Oral Care Recommendations: Oral care BID;Staff/trained caregiver to provide oral care Follow Up Recommendations:  (TBD) Assistance recommended at discharge: Intermittent Supervision/Assistance SLP Visit Diagnosis: Dysphagia, unspecified (R13.10) Plan: Continue with current plan of care           Royce Macadamia  01/10/2022, 11:31 AM

## 2022-01-11 DIAGNOSIS — F1910 Other psychoactive substance abuse, uncomplicated: Secondary | ICD-10-CM

## 2022-01-11 DIAGNOSIS — F1999 Other psychoactive substance use, unspecified with unspecified psychoactive substance-induced disorder: Secondary | ICD-10-CM

## 2022-01-11 DIAGNOSIS — I469 Cardiac arrest, cause unspecified: Secondary | ICD-10-CM | POA: Diagnosis not present

## 2022-01-11 LAB — CULTURE, BLOOD (ROUTINE X 2)
Culture: NO GROWTH
Culture: NO GROWTH
Special Requests: ADEQUATE

## 2022-01-11 LAB — GLUCOSE, CAPILLARY
Glucose-Capillary: 92 mg/dL (ref 70–99)
Glucose-Capillary: 100 mg/dL — ABNORMAL HIGH (ref 70–99)
Glucose-Capillary: 81 mg/dL (ref 70–99)
Glucose-Capillary: 82 mg/dL (ref 70–99)

## 2022-01-11 LAB — BASIC METABOLIC PANEL
Anion gap: 7 (ref 5–15)
BUN: 15 mg/dL (ref 6–20)
CO2: 21 mmol/L — ABNORMAL LOW (ref 22–32)
Calcium: 8.6 mg/dL — ABNORMAL LOW (ref 8.9–10.3)
Chloride: 116 mmol/L — ABNORMAL HIGH (ref 98–111)
Creatinine, Ser: 0.77 mg/dL (ref 0.44–1.00)
GFR, Estimated: >60 mL/min (ref >60)
Glucose, Bld: 99 mg/dL (ref 70–99)
Potassium: 3.7 mmol/L (ref 3.5–5.1)
Sodium: 144 mmol/L (ref 135–145)

## 2022-01-11 LAB — SARS CORONAVIRUS 2 BY RT PCR: SARS Coronavirus 2 by RT PCR: NEGATIVE

## 2022-01-11 LAB — MAGNESIUM: Magnesium: 1.9 mg/dL (ref 1.7–2.4)

## 2022-01-11 MED ORDER — LAMOTRIGINE 25 MG PO TABS: 25.0000 mg | ORAL_TABLET | Freq: Two times a day (BID) | ORAL | Status: DC

## 2022-01-11 NOTE — Progress Notes (Addendum)
Pt has been accepted to [facility] and they can accept today pending COVID test result. IVC ppw faxed to [facility] [phone].   Transport can be arranged by calling [employer] and [employer], academic transport for IVC patient [phone], option 1, option 3. [facility] will provide instructions for report once they able to accept pt.   UPDATE 1830: COVID lab still shows in process. [facility] AC aware of delay and will update 2nd shift AC who can facilitate transfer once COVID result available.   Dellie Burns, MSW, LCSW [phone] (coverage)

## 2022-01-11 NOTE — Discharge Summary (Signed)
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 She had CPR by EMTs, [place] after 20 minutes, and was brought to the hospital. She was in the ICU, eventually extubated 11/27 and transferred to the [facility] service on 11/29.  Psychiatry was consulted and now is recommending transfer to inpatient psychiatric service once medically stable.  She is currently medically stable for discharge.  She will be discharged to inpatient psychiatric facility once bed is available.  She has been currently IVC'd as per psychiatry recommendations.  Discharge Diagnoses:   Acute hypoxic and toxic encephalopathy -following cardiac arrest due to fentanyl and cocaine overdose.  She has been extubated 11/27.   -Mental status has much improved.  Still slow to response but answers questions appropriately currently.    Long history of drug use, PTSD, depression ?  Suicidal ideation -Psychiatry following.  Continue current medications as per psychiatry recommendations.  -She has been currently IVC'd as per psychiatry recommendations. - Psychiatry was consulted and now is recommending transfer to inpatient psychiatric service once medically stable.  She is currently medically stable for discharge.   She will be discharged to inpatient psychiatric facility once bed is available.  Acute hypoxic respiratory failure, aspiration pneumonia -Secondary to overdose and aspiration pneumonia, completed 5 days of Unasyn -Currently on room air.  No need for antibiotics on discharge.  History of CVA with spasticity -In the background of drug overdose in 2018, continue home medications.  Follows with PMR regularly as an outpatient.  She worked well with PT, no PT follow-up   Severe metabolic acidosis-Resolved   Shock liver -Improving, liver enzymes can be followed up intermittently as an outpatient.  Hypoglycemia -Likely secondary to cardiac arrest and aspiration pneumonia, now improved   History of seizures -Continue Lamictal, Tegretol   Hypomagnesemia-improved Discharge Instructions  Discharge Instructions     Diet general   Complete by: As directed    Increase activity slowly   Complete by: As directed       Allergies as of 01/14/2022       Reactions   Pristiq [desvenlafaxine Succinate Er] Rash   Desvenlafaxine Rash   Other Rash   Tide detergent  Tide detergent  Tide detergent    Sulfa Antibiotics Rash        Medication List     STOP taking these medications    atorvastatin 20 MG tablet Commonly known as: LIPITOR   nitrofurantoin (macrocrystal-monohydrate) 100 MG capsule Commonly known as: MACROBID   propranolol 10 MG tablet Commonly known as: INDERAL   spironolactone 50 MG tablet Commonly known as: ALDACTONE   tiZANidine 4 MG tablet Commonly known as: ZANAFLEX       TAKE these medications    acetaminophen 325 MG tablet Commonly known as: TYLENOL Take 2 tablets (650 mg total) by mouth every 4 (four) hours as needed for mild pain (or temp > 37.5 C (99.5 F)).  baclofen 10 MG tablet Commonly known as: LIORESAL Take 1 tablet (10 mg total) by mouth 3 (three) times daily.   busPIRone 10 MG tablet Commonly known as: BUSPAR TAKE 1 TABLET BY MOUTH THREE  TIMES A DAY   gabapentin 300 MG capsule Commonly known as: Neurontin Take 1 capsule (300 mg total) by mouth at bedtime.   lamoTRIgine 25 MG tablet Commonly known as: LAMICTAL Take 1 tablet (25 mg total) by mouth 2 (two) times daily. What changed:  how much to take when to take this   mirtazapine 15 MG tablet Commonly known as: REMERON Take 15 mg by mouth at bedtime.   naloxone 4 MG/0.1ML Liqd nasal spray kit Commonly known as: NARCAN ADMINISTER A SINGLE SPRAY IN ONE NOSTRIL UPON SIGNS OF OPIOID OVERDOSE. REPEAT EVERY 2 - 3 MINUTES IF NO RESPONSE IN ALTERNATING NOSTRIL TIL EMS ARRIVES.   nicotine 21 mg/24hr patch Commonly known as: NICODERM CQ - dosed in mg/24 hours Place 1 patch (21 mg total) onto the skin daily.   OXcarbazepine 150 MG tablet Commonly known as: TRILEPTAL Take 150 mg by mouth 2 (two) times daily.   QUEtiapine 50 MG tablet Commonly known as: SEROquel Take 1 tablet (50 mg total) by mouth at bedtime.   SUMAtriptan 50 MG tablet Commonly known as: IMITREX Take 1 tablet by mouth every 2 hours as needed for migraine. May repeat in 2 hours if headache persists or recurs.          Allergies  Allergen Reactions   Pristiq [Desvenlafaxine Succinate Er] Rash   Desvenlafaxine Rash   Other Rash    Tide detergent  Tide detergent  Tide detergent    Sulfa Antibiotics Rash    Consultations: PCCM/psychiatry   Procedures/Studies: DG Chest Port 1 View  Result Date: 01/08/2022 CLINICAL DATA:  Acute respiratory failure. Recheck right lung density. EXAM: PORTABLE CHEST 1 VIEW COMPARISON:  AP chest 01/06/2022, 01/05/2022, 02/07/2019 FINDINGS: Interval removal of endotracheal tube and enteric tube. Cardiac silhouette and mediastinal contours are within normal limits. Redemonstration of vague density overlying the inferior medial right lung. Again this does not obscure the right heart border. This may be within the right lower lobe. It is again new from 01/05/2022  and appears similar to 01/06/2022. The right lung is clear. No definite pleural effusion. No pneumothorax. No acute skeletal abnormality. IMPRESSION: 1. Interval removal of endotracheal tube and enteric tube. 2. Redemonstration of vague density overlying the inferior medial right lung. This may be within the right lower lobe. It is again new from 01/05/2022 and appears similar to 01/06/2022. This may represent developing infection or sequela of aspiration. Electronically Signed   By: Yvonne Kendall M.D.   On: 01/08/2022 08:38   DG Swallowing Func-Speech Pathology  Result Date: 01/07/2022 Table formatting from the original result was not included. Objective Swallowing Evaluation: Type of Study: MBS-Modified Barium Swallow Study  Patient Details Name: Jane Watkins MRN: [id number] Date of Birth: 1995-08-16 Today's Date: 01/07/2022 Time: SLP Start Time (ACUTE ONLY): 7 -SLP Stop Time (ACUTE ONLY): 1055 SLP Time Calculation (min) (ACUTE ONLY): 15 min Past Medical History: Past Medical History: Diagnosis Date  AKI (acute kidney injury) ([place]) 2018  "from overdose"  Anxiety   Chlamydia   Daily headache   Depression   Drug overdose 04/2016  [name] 05/01/2016  GERD (gastroesophageal reflux disease)   "when I was younger; gone now" (09/21/2017)  IV drug abuse ([place]) 09/20/2017  Migraine   "q couple weeks" (09/21/2017)  Opioid  abuse ([place])   Overdose   Stroke [facility])  Past Surgical History: Past Surgical History: Procedure Laterality Date  APPENDECTOMY  04/2013  I & D EXTREMITY Left 09/20/2017  Procedure: IRRIGATION AND DEBRIDEMENT LEFT ARM;  Surgeon: [name], MD;  Location: [facility];  Service: Orthopedics;  Laterality: Left;  TEE WITHOUT CARDIOVERSION N/A 02/10/2019  Procedure: TRANSESOPHAGEAL ECHOCARDIOGRAM (TEE);  Surgeon: [name], MD;  Location: [facility] ORS;  Service: Cardiovascular;  Laterality: N/A;  Polysubstance abuser  TONGUE SURGERY  ~ 2007  "related to lisp"  TRACHEOSTOMY  04/2016  /notes 05/01/2016 HPI:  Patient is a 26 y.o. 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 She presented to the ED on 01/05/22 with witnessed overdose of crack cocaine and fentanyl. She was found pulseless and in asystole; downtime unclear, no bystander CPR, time to ROSC estimated at 20 minutes, no response to Narcan. She was changed from [place] to ETT for airway. CT head ordered but did not show any acute changes.  PCXR showed lungs to be clear, no infiltrate. She was extubated to supplemental oxygen via nasal cannula on 01/06/22. She failed [facility] with nursing and is NPO awaiting SLP evaluation.  Subjective: more alert than this morning, participates fully  Recommendations for follow up therapy are one component of a multi-disciplinary discharge planning process, led by the attending physician.  Recommendations may be updated based on patient status, additional functional criteria and insurance authorization. Assessment / Plan / Recommendation   01/07/2022   1:33 PM Clinical Impressions Clinical Impression Patient presents with a mild oropharyngeal dysphagia as per this MBS. During oral phase, she exhibited mildly delayed mastication with mechanical soft solids but with full clearance of oral cavity post swallow. During pharyngeal phase of swallow, patient exhibited swallow initiation delays at level of vallecular sinus with puree, mechanical soft, nectar and thin via cup sips and swallow initiation delay at level of pyriform sinus with thin liquids via straw sips. No significant pharyngeal residuals remained post initial swallows with any of the tested consistencies. Trace aspiration occured with thin liquids via straw sip, resulting in immediate cough response from patient. She also exhibited trace aspiration which was sensed and occuring during the swallow with cup sip of thin liquids towards end of study. No aspiration or  penetration observed with nectar thick liquids. 85m barium tablet transited through pharynx and UES without difficulty; esophageal sweep did not reveal any stasis or concerns for dysmotility. SLP recommending initiate PO diet of Dys 3 (mechanical soft) solids and nectar thick liquids and allow patient to have plain/thin water in between meals.     01/07/2022   1:32 PM Treatment Recommendations Treatment Recommendations Therapy as outlined in treatment plan below     01/07/2022   1:32 PM Prognosis Prognosis for Safe Diet Advancement Good Barriers to Reach Goals Cognitive deficits   01/07/2022   1:32 PM Diet Recommendations SLP Diet Recommendations Dysphagia 3 (Mech soft) solids;Nectar thick liquid Liquid Administration via Cup;No straw Medication Administration Whole meds with puree Compensations Minimize environmental distractions;Slow rate;Small sips/bites Postural Changes Seated upright at 90 degrees     01/07/2022   1:32 PM Other Recommendations Oral Care Recommendations Oral care BID Follow Up Recommendations Follow physician's recommendations for discharge plan and follow up therapies Functional Status Assessment Patient has had a recent decline in their functional status and demonstrates the ability to make significant improvements in function in a reasonable and predictable amount  of time.   01/07/2022   1:32 PM Frequency and Duration  Speech Therapy Frequency (ACUTE ONLY) min 2x/week Treatment Duration 2 weeks     01/07/2022   1:28 PM Oral Phase Oral Phase Impaired Oral - Nectar Cup [facility] Oral - Thin Cup WFL Oral - Puree WFL Oral - Mech Soft Impaired mastication Oral - Pill [facility]    01/07/2022   1:30 PM Pharyngeal Phase Pharyngeal Phase Impaired Pharyngeal- Nectar Cup Delayed swallow initiation-vallecula Pharyngeal- Thin Cup Delayed swallow initiation-vallecula;Penetration/Aspiration during swallow Pharyngeal Material enters airway, passes BELOW cords and not ejected out despite cough attempt by patient  Pharyngeal- Thin Straw Penetration/Aspiration during swallow;Reduced airway/laryngeal closure;Delayed swallow initiation-pyriform sinuses Pharyngeal Material enters airway, passes BELOW cords and not ejected out despite cough attempt by patient Pharyngeal- Puree Delayed swallow initiation-vallecula Pharyngeal- Mechanical Soft Delayed swallow initiation-vallecula Pharyngeal- Pill [facility]    01/07/2022   1:32 PM Cervical Esophageal Phase  Cervical Esophageal Phase [facility] Sonia Baller, MA, CCC-SLP Speech Therapy                     EEG adult  Result Date: 01/06/2022 Lora Havens, MD     01/06/2022  8:25 AM Patient Name: Minami Arriaga MRN: [id number] Epilepsy Attending: Lora Havens Referring Physician/Provider: Erick Colace, NP Date: 01/05/2022 Duration: 21.50 mins Patient history: 26 year old female with altered mental status.  EEG to evaluate for seizure. Level of alertness: lethargic AEDs during EEG study: Lamotrigine, gabapentin Technical aspects: This EEG study was done with scalp electrodes positioned according to the 10-20 International system of electrode placement. Electrical activity was reviewed with band pass filter of 1-_0 , sensitivity of 7 uV/mm, display speed of 30m/sec with a _1  notched filter applied as appropriate. EEG data were recorded continuously and digitally stored.  Video monitoring was available and reviewed as appropriate. Description: EEG showed continuous generalized 3 to 6 Hz theta-delta slowing. Hyperventilation and photic stimulation were not performed.   ABNORMALITY - Continuous slow, generalized IMPRESSION: This study is suggestive of moderate to severe diffuse encephalopathy, nonspecific etiology. No seizures or epileptiform discharges were seen throughout the recording. PLora Havens  DG Chest Port 1 View  Result Date: 01/06/2022 CLINICAL DATA:  26year old female with history of respiratory failure. EXAM: PORTABLE CHEST 1 VIEW COMPARISON:  Chest x-ray  01/05/2022. FINDINGS: An endotracheal tube is in place with tip 3.0 cm above the carina. A nasogastric tube is seen extending into the stomach, however, the tip of the nasogastric tube extends below the lower margin of the image. Lung volumes are normal. New vague density in the medial aspect of the right mid to lower lung, without obscuration of the right heart border or the medial right hemidiaphragm. No pleural effusions. No pneumothorax. No evidence of pulmonary edema. Heart size is normal. Upper mediastinal contours are within normal limits. IMPRESSION: 1. Support apparatus, as above. 2. New vague density in the medial aspect of the right mid to lower lung, concerning for developing airspace consolidation, potentially infection or sequela of aspiration. Electronically Signed   By: DVinnie LangtonM.D.   On: 01/06/2022 05:42   ECHOCARDIOGRAM COMPLETE  Result Date: 01/05/2022    ECHOCARDIOGRAM REPORT   Patient Name:   LElsi StelzerDate of Exam: 01/05/2022 Medical Rec #:  [id number]     Height:       66.0 in Accession #:    [id number]    Weight:       127.0 lb  Date of Birth:  [birth year]       BSA:          1.649 m Patient Age:    26 years       BP:           100/60 mmHg Patient Gender: F              HR:           108 bpm. Exam Location:  Inpatient Procedure: 2D Echo, Cardiac Doppler and Color Doppler                        STAT ECHO Reported to: Dr Dani Gobble Croitoru on 01/05/2022 1:57:00 PM. Indications:    Cardiomegaly  History:        Patient has prior history of Echocardiogram examinations, most                 recent 02/10/2019. Risk Factors:Dyslipidemia and Current Smoker.                 Polysubstance abuse. Hx CVA.  Sonographer:    Clayton Lefort RDCS (AE) Referring Phys: Elgie Congo  Sonographer Comments: Echo performed with patient supine and on artificial respirator. IMPRESSIONS  1. Left ventricular ejection fraction, by estimation, is 55 to 60%. The left ventricle has normal function. The left  ventricle has no regional wall motion abnormalities. Left ventricular diastolic parameters were normal.  2. Right ventricular systolic function is normal. The right ventricular size is normal. Tricuspid regurgitation signal is inadequate for assessing PA pressure.  3. The mitral valve is grossly normal. Trivial mitral valve regurgitation.  4. The aortic valve is tricuspid. Aortic valve regurgitation is not visualized. Aortic valve mean gradient measures 4.0 mmHg.  5. Unable to estimate CVP. Comparison(s): Prior images reviewed side by side. LVEF remains normal range at 55-60%. FINDINGS  Left Ventricle: Left ventricular ejection fraction, by estimation, is 55 to 60%. The left ventricle has normal function. The left ventricle has no regional wall motion abnormalities. The left ventricular internal cavity size was normal in size. There is  no left ventricular hypertrophy. Left ventricular diastolic parameters were normal. Right Ventricle: The right ventricular size is normal. No increase in right ventricular wall thickness. Right ventricular systolic function is normal. Tricuspid regurgitation signal is inadequate for assessing PA pressure. Left Atrium: Left atrial size was normal in size. Right Atrium: Right atrial size was normal in size. Pericardium: There is no evidence of pericardial effusion. Mitral Valve: The mitral valve is grossly normal. Trivial mitral valve regurgitation. Tricuspid Valve: The tricuspid valve is grossly normal. Tricuspid valve regurgitation is trivial. Aortic Valve: The aortic valve is tricuspid. Aortic valve regurgitation is not visualized. Aortic valve mean gradient measures 4.0 mmHg. Aortic valve peak gradient measures 6.7 mmHg. Aortic valve area, by VTI measures 1.96 cm. Pulmonic Valve: The pulmonic valve was not well visualized. Pulmonic valve regurgitation is not visualized. Aorta: The aortic root is normal in size and structure. Venous: Unable to estimate CVP. IVC assessment for right  atrial pressure unable to be performed due to mechanical ventilation. IAS/Shunts: The interatrial septum was not well visualized.  LEFT VENTRICLE PLAX 2D LVIDd:         3.70 cm LVIDs:         2.60 cm LV PW:         1.00 cm LV IVS:        0.80 cm LVOT diam:  1.90 cm LV SV:         44 LV SV Index:   27 LVOT Area:     2.84 cm  RIGHT VENTRICLE             IVC RV Basal diam:  2.70 cm     IVC diam: 2.10 cm RV S prime:     12.00 cm/s TAPSE (M-mode): 2.0 cm LEFT ATRIUM             Index        RIGHT ATRIUM           Index LA diam:        2.30 cm 1.39 cm/m   RA Area:     10.60 cm LA Vol (A2C):   29.8 ml 18.07 ml/m  RA Volume:   22.80 ml  13.83 ml/m LA Vol (A4C):   10.8 ml 6.55 ml/m LA Biplane Vol: 18.8 ml 11.40 ml/m  AORTIC VALVE AV Area (Vmax):    2.18 cm AV Area (Vmean):   2.07 cm AV Area (VTI):     1.96 cm AV Vmax:           129.00 cm/s AV Vmean:          89.800 cm/s AV VTI:            0.226 m AV Peak Grad:      6.7 mmHg AV Mean Grad:      4.0 mmHg LVOT Vmax:         99.00 cm/s LVOT Vmean:        65.500 cm/s LVOT VTI:          0.156 m LVOT/AV VTI ratio: 0.69  AORTA Ao Root diam: 2.70 cm  SHUNTS Systemic VTI:  0.16 m Systemic Diam: 1.90 cm Rozann Lesches MD Electronically signed by Rozann Lesches MD Signature Date/Time: 01/05/2022/2:04:12 PM    Final    CT HEAD WO CONTRAST  Result Date: 01/05/2022 CLINICAL DATA:  Post CPR/overdose. EXAM: CT HEAD WITHOUT CONTRAST TECHNIQUE: Contiguous axial images were obtained from the base of the skull through the vertex without intravenous contrast. RADIATION DOSE REDUCTION: This exam was performed according to the departmental dose-optimization program which includes automated exposure control, adjustment of the mA and/or kV according to patient size and/or use of iterative reconstruction technique. COMPARISON:  CT head 04/29/2021. FINDINGS: Brain: There is no evidence of acute intracranial hemorrhage, extra-axial fluid collection, or acute infarct. There is no  evidence of global anoxic injury by CT. Remote infarcts in the right basal ganglia and high right frontal and parietal lobes is unchanged, with stable slight ex vacuo dilatation of the body of the right lateral ventricle. Background parenchymal volume is otherwise normal. The ventricles are otherwise normal in size. There is no mass lesion.  There is no mass effect or midline shift. Vascular: No hyperdense vessel or unexpected calcification. Skull: Normal. Negative for fracture or focal lesion. Sinuses/Orbits: Layering fluid in the sinuses is likely related to instrumentation. The globes and orbits are unremarkable. Other: None. IMPRESSION: 1. No evidence of acute intracranial pathology. No definite evidence of anoxic injury by CT. Short interval follow-up CT or MRI may be considered if there is continued clinical concern. 2. Unchanged remote infarcts in the right cerebral hemisphere. Electronically Signed   By: Valetta Mole M.D.   On: 01/05/2022 13:39   DG Abd Portable 1V  Result Date: 01/05/2022 CLINICAL DATA:  NGT placement EXAM: PORTABLE ABDOMEN - 1 VIEW COMPARISON:  None Available. FINDINGS: The bowel gas pattern is normal. No radio-opaque calculi or other significant radiographic abnormality are seen. NG tube tip superimposed with the stomach below the diaphragm. IMPRESSION: Negative. Electronically Signed   By: Sammie Bench M.D.   On: 01/05/2022 13:07   DG Chest Port 1 View  Result Date: 01/05/2022 CLINICAL DATA:  Intubated EXAM: PORTABLE CHEST 1 VIEW COMPARISON:  02/07/2019. FINDINGS: The heart size and mediastinal contours are within normal limits. Both lungs are clear. No pneumothorax or pleural effusion. Endotracheal tube tip is at the thoracic inlet. NG tube tip is below the diaphragm and off x-ray. IMPRESSION: Lungs are clear. Electronically Signed   By: Sammie Bench M.D.   On: 01/05/2022 13:06      Subjective: Patient seen and examined at bedside.  Bedside sitter present.  Poor  historian.  Wakes up slightly, answers some questions.  Does not participate in conversation much.  No overnight fever, seizures reported.  Discharge Exam: Vitals:   01/11/22 0547 01/11/22 1031  BP: 139/72 109/63  Pulse: 71 69  Resp: 18 18  Temp: 97.7 F (36.5 C) 98.5 F (36.9 C)  SpO2: 99% 100%    General: Pt is alert, awake, not in acute distress.  On room air.  Extremities ratified.  Slow to respond.  Answers some questions but does not participate in conversation much. Cardiovascular: rate controlled, S1/S2 + Respiratory: bilateral decreased breath sounds at bases Abdominal: Soft, NT, ND, bowel sounds + Extremities: no edema, no cyanosis    The results of significant diagnostics from this hospitalization (including imaging, microbiology, ancillary and laboratory) are listed below for reference.     Microbiology: Recent Results (from the past 240 hour(s))  Urine Culture     Status: None   Collection Time: 01/05/22  4:17 PM   Specimen: Urine, Catheterized  Result Value Ref Range Status   Specimen Description URINE, CATHETERIZED  Final   Special Requests NONE  Final   Culture   Final    NO GROWTH Performed at [facility], [address], [place], [place] [address]    Report Status 01/06/2022 FINAL  Final  MRSA Next Gen by PCR, Nasal     Status: None   Collection Time: 01/05/22  4:29 PM   Specimen: Nasal Mucosa; Nasal Swab  Result Value Ref Range Status   MRSA by PCR Next Gen NOT DETECTED NOT DETECTED Final    Comment: (NOTE) The GeneXpert MRSA Assay (FDA approved for NASAL specimens only), is one component of a comprehensive MRSA colonization surveillance program. It is not intended to diagnose MRSA infection nor to guide or monitor treatment for MRSA infections. Test performance is not FDA approved in patients less than 75 years old. Performed at [facility], [place] [address] [place], [place] [address]   Culture, blood (Routine X 2) w Reflex to ID  Panel     Status: None   Collection Time: 01/06/22  9:02 AM   Specimen: BLOOD LEFT ARM  Result Value Ref Range Status   Specimen Description BLOOD LEFT ARM  Final   Special Requests IN PEDIATRIC BOTTLE Blood Culture adequate volume  Final   Culture   Final    NO GROWTH 5 DAYS Performed at [facility], [place] [address], [place], [place] [address]    Report Status 01/11/2022 FINAL  Final  Culture, blood (Routine X 2) w Reflex to ID Panel     Status: None   Collection Time: 01/06/22  9:02 AM   Specimen: BLOOD LEFT ARM  Result Value Ref Range Status   Specimen Description BLOOD LEFT ARM  Final   Special Requests   Final    BOTTLES DRAWN AEROBIC ONLY Blood Culture results may not be optimal due to an inadequate volume of blood received in culture bottles   Culture   Final    NO GROWTH 5 DAYS Performed at [facility], [place] [address], [place], [place] [address]    Report Status 01/11/2022 FINAL  Final     Labs: BNP (last 3 results) No results for input(s): "BNP" in the last 8760 hours. Basic Metabolic Panel: Recent Labs  Lab 01/05/22 1230 01/05/22 1247 01/07/22 0446 01/08/22 0640 01/09/22 0816 01/10/22 0606 01/11/22 0338  NA 132*   < > 138 138 147* 144 144  K 4.4   < > 4.0 3.3* 3.8 3.6 3.7  CL 100   < > 110 108 117* 118* 116*  CO2 10*   < > _0 21* 21*  GLUCOSE >1,200*   < > 78 93 90 113* 99  BUN 19   < > 9 <5* <5* 11 15  CREATININE 1.85*   < > 0.62 0.63 0.65 0.72 0.77  CALCIUM 7.6*   < > 8.1* 8.2* 8.7* 8.7* 8.6*  MG 2.8*  --   --   --   --  1.6* 1.9  PHOS >30.0*  --   --   --   --   --   --    < > = values in this interval not displayed.   Liver Function Tests: Recent Labs  Lab 01/06/22 1716 01/07/22 0931 01/08/22 0640 01/09/22 0816 01/10/22 0606  AST 742* 385* 132* 44* 24  ALT 1,251* 1,021* 781* 509* 307*  ALKPHOS 48 54 55 50 43  BILITOT 1.3* 1.4* 1.4* 0.8 0.4  PROT 4.8* 4.9* 5.7* 5.7* 5.5*  ALBUMIN 3.0* 3.0* 3.2* 3.2* 2.9*   No results for  input(s): "LIPASE", "AMYLASE" in the last 168 hours. No results for input(s): "AMMONIA" in the last 168 hours. CBC: Recent Labs  Lab 01/05/22 1230 01/05/22 1247 01/06/22 0415 01/06/22 0501 01/07/22 0719 01/07/22 0931 01/07/22 1122 01/08/22 0640 01/10/22 0606  WBC 13.0*  --  13.9*  --  10.0  --   --  7.4 6.4  HGB 10.1*   < > 12.1 11.9* 10.7*  --   --  12.0 11.5*  HCT 41.9   < > 36.4 35.0* 33.7*  --   --  35.6* 33.9*  MCV 127.4*  --  88.8  --  91.6  --   --  89.0 89.0  PLT 291  --  241  --  212 SPECIMEN CLOTTED 205 220 254   < > = values in this interval not displayed.   Cardiac Enzymes: No results for input(s): "CKTOTAL", "CKMB", "CKMBINDEX", "TROPONINI" in the last 168 hours. BNP: Invalid input(s): "POCBNP" CBG: Recent Labs  Lab 01/10/22 1557 01/10/22 2003 01/10/22 2344 01/11/22 0813 01/11/22 1147  GLUCAP 82 86 110* 92 100*   D-Dimer No results for input(s): "DDIMER" in the last 72 hours. Hgb A1c No results for input(s): "HGBA1C" in the last 72 hours. Lipid Profile No results for input(s): "CHOL", "HDL", "LDLCALC", "TRIG", "CHOLHDL", "LDLDIRECT" in the last 72 hours. Thyroid function studies No results for input(s): "TSH", "T4TOTAL", "T3FREE", "THYROIDAB" in the last 72 hours.  Invalid input(s): "FREET3" Anemia work up No results for input(s): "VITAMINB12", "FOLATE", "FERRITIN", "TIBC", "  IRON", "RETICCTPCT" in the last 72 hours. Urinalysis    Component Value Date/Time   COLORURINE YELLOW 01/05/2022 1617   APPEARANCEUR HAZY (A) 01/05/2022 1617   APPEARANCEUR Clear 04/07/2019 1405   LABSPEC 1.013 01/05/2022 1617   PHURINE 5.0 01/05/2022 1617   GLUCOSEU 150 (A) 01/05/2022 1617   HGBUR MODERATE (A) 01/05/2022 1617   BILIRUBINUR NEGATIVE 01/05/2022 1617   BILIRUBINUR neg 08/27/2021 1503   BILIRUBINUR Negative 04/07/2019 1405   KETONESUR 20 (A) 01/05/2022 1617   PROTEINUR 30 (A) 01/05/2022 1617   UROBILINOGEN 0.2 08/27/2021 1503   NITRITE NEGATIVE 01/05/2022  1617   LEUKOCYTESUR NEGATIVE 01/05/2022 1617   Sepsis Labs Recent Labs  Lab 01/06/22 0415 01/07/22 0719 01/08/22 0640 01/10/22 0606  WBC 13.9* 10.0 7.4 6.4   Microbiology Recent Results (from the past 240 hour(s))  Urine Culture     Status: None   Collection Time: 01/05/22  4:17 PM   Specimen: Urine, Catheterized  Result Value Ref Range Status   Specimen Description URINE, CATHETERIZED  Final   Special Requests NONE  Final   Culture   Final    NO GROWTH Performed at [facility], [place] [address] [place], [place] [address]    Report Status 01/06/2022 FINAL  Final  MRSA Next Gen by PCR, Nasal     Status: None   Collection Time: 01/05/22  4:29 PM   Specimen: Nasal Mucosa; Nasal Swab  Result Value Ref Range Status   MRSA by PCR Next Gen NOT DETECTED NOT DETECTED Final    Comment: (NOTE) The GeneXpert MRSA Assay (FDA approved for NASAL specimens only), is one component of a comprehensive MRSA colonization surveillance program. It is not intended to diagnose MRSA infection nor to guide or monitor treatment for MRSA infections. Test performance is not FDA approved in patients less than 95 years old. Performed at [facility], [place] [address] [place], [place] [address]   Culture, blood (Routine X 2) w Reflex to ID Panel     Status: None   Collection Time: 01/06/22  9:02 AM   Specimen: BLOOD LEFT ARM  Result Value Ref Range Status   Specimen Description BLOOD LEFT ARM  Final   Special Requests IN PEDIATRIC BOTTLE Blood Culture adequate volume  Final   Culture   Final    NO GROWTH 5 DAYS Performed at [facility], [place] [address], [place], [place] [address]    Report Status 01/11/2022 FINAL  Final  Culture, blood (Routine X 2) w Reflex to ID Panel     Status: None   Collection Time: 01/06/22  9:02 AM   Specimen: BLOOD LEFT ARM  Result Value Ref Range Status   Specimen Description BLOOD LEFT ARM  Final   Special Requests   Final    BOTTLES DRAWN AEROBIC  ONLY Blood Culture results may not be optimal due to an inadequate volume of blood received in culture bottles   Culture   Final    NO GROWTH 5 DAYS Performed at [facility], [place] [address], [place], [place] [address]    Report Status 01/11/2022 FINAL  Final     Time coordinating discharge: 35 minutes  SIGNED:   Aline August, MD  [facility] 01/11/2022, 12:08 PM

## 2022-01-11 NOTE — Progress Notes (Signed)
IVC complete and paperwork on chart awaiting [employer] to serve. Referral made to [facility], spoke to [place]. SW will follow for inpt psych placement.   Dellie Burns, MSW, LCSW [phone] (coverage)

## 2022-01-12 LAB — GLUCOSE, CAPILLARY
Glucose-Capillary: 101 mg/dL — ABNORMAL HIGH (ref 70–99)
Glucose-Capillary: 94 mg/dL (ref 70–99)
Glucose-Capillary: 98 mg/dL (ref 70–99)

## 2022-01-12 NOTE — Progress Notes (Signed)
Patient is currently waiting to be transferred to inpatient psychiatric hospital.  She is currently medically stable for discharge.  Please refer to the full discharge summary done by me on 01/11/2022 for full details.  Patient seen and examined at bedside today; family members updated at bedside.

## 2022-01-12 NOTE — Progress Notes (Signed)
Spoke to [facility] who reports after closer review, they are no longer able to accept pt due to pt's medical history and potential need for immediate acute care. [facility] spoke with [facility] re admission but they declined.   SW made referrals to inpatient psych facilities. Will provide updates as available.   Dellie Burns, MSW, LCSW [phone] (coverage)

## 2022-01-13 LAB — GLUCOSE, CAPILLARY
Glucose-Capillary: 80 mg/dL (ref 70–99)
Glucose-Capillary: 86 mg/dL (ref 70–99)
Glucose-Capillary: 89 mg/dL (ref 70–99)
Glucose-Capillary: 94 mg/dL (ref 70–99)
Glucose-Capillary: 98 mg/dL (ref 70–99)

## 2022-01-13 NOTE — Progress Notes (Signed)
Patient is still waiting to be transferred to inpatient psychiatric hospital.  She is currently medically stable for discharge.  Please refer to the full discharge summary done by me me on 01/11/2022 for full details.

## 2022-01-13 NOTE — Plan of Care (Signed)
Patient had active discharge orders in dated back to discharge date of 12/2. Patient did not discharge on that date. Discharge orders were discontinued since patient did not discharge. MD notified. MD stated not to discontinue discharge orders in the future and placed them back in. Management made aware.

## 2022-01-13 NOTE — Progress Notes (Signed)
Speech Language Pathology Treatment: Dysphagia  Patient Details Name: Phoebe Marter MRN: [id number] DOB: Mar 27, 1995 Today's Date: 01/13/2022 Time: 1420-1430 SLP Time Calculation (min) (ACUTE ONLY): 10 min  Assessment / Plan / Recommendation Clinical Impression  Pt tolerating small sips of thin liquid. Pt is independent with strategy, needs no cues. No need for further SLP f/u or diet modification. Will sign off.   HPI        SLP Plan  Continue with current plan of care      Recommendations for follow up therapy are one component of a multi-disciplinary discharge planning process, led by the attending physician.  Recommendations may be updated based on patient status, additional functional criteria and insurance authorization.    Recommendations  Diet recommendations: Regular;Thin liquid Liquids provided via: Cup;Straw Medication Administration: Whole meds with liquid Supervision: Patient able to self feed;Intermittent supervision to cue for compensatory strategies Compensations: Minimize environmental distractions;Slow rate;Small sips/bites Postural Changes and/or Swallow Maneuvers: Seated upright 90 degrees                Follow Up Recommendations: No SLP follow up Assistance recommended at discharge: Intermittent Supervision/Assistance SLP Visit Diagnosis: Dysphagia, unspecified (R13.10) Plan: Continue with current plan of care           Geovanna Simko, Riley Nearing  01/13/2022, 3:11 PM

## 2022-01-13 NOTE — TOC Progression Note (Signed)
Transition of Care [facility]) - Progression Note    Patient Details  Name: Jane Watkins MRN: [id number] Date of Birth: November 23, 1995  Transition of Care [facility]) CM/SW Contact  Carmina Miller, LCSWA Phone Number: 01/13/2022, 5:03 PM  Clinical Narrative:     Pt not appropriate for [facility] or [facility] due to limitations and fall risks. CSW refaxed pt out to outside facilities. TOC will continue to follow.    Expected Discharge Plan: Home/Self Care Barriers to Discharge: Continued Medical Work up  Expected Discharge Plan and Services Expected Discharge Plan: Home/Self Care   Discharge Planning Services: CM Consult   Living arrangements for the past 2 months: Single Family Home Expected Discharge Date: 01/13/22                                     Social Determinants of Health (SDOH) Interventions    Readmission Risk Interventions    01/09/2022    3:02 PM  Readmission Risk Prevention Plan  Transportation Screening Complete  PCP or Specialist Appt within 5-7 Days Complete  Home Care Screening Complete  Medication Review (RN CM) Complete

## 2022-01-13 NOTE — Progress Notes (Cosign Needed)
[facility] Followup Face-to-Face Psychiatric Evaluation  Service Date: January 13, 2022 LOS:  LOS: 8 days   Assessment  Jane Watkins is a 26 y.o. female admitted medically for 01/05/2022 12:28 PM for cardiac arrest. She carries the psychiatric diagnoses of cocaine use disorder, marijuana use disorder, opioid use disorder, benzodiazapine abuse, depression, anxiety and has a past medical history of R MCA stroke in 2021. Psychiatry was consulted for potential self-harm by Dr. Letta Median.   Her current presentation of overdose in the setting of sending concerning text messages to parents ("I never wanted to hurt anybody but myself") is most consistent with suspected suicide attempt. She meets criteria for IVC based on collateral information from parents. Current outpatient psychotropic medications include seroquel 50 QHS, baclofen 4m TID, gabapentin 300 QHS, lamotrigine 50 daily, trileptal 150 BID, and remeron 151mQHS. On initial examination, there was no evidence that patient intentionally took overdose with intention of harming herself or ending her life and her home medications were continued. With collateral obtained, there was increased concern that patient's ingestion was a suicide attempt and patient was IVC'ed. Patient is pending behavioral health inpatient placement at this time. TOC is aware - appreciate assistance. Please see plan below for detailed recommendations.   Diagnoses:  Active Hospital problems: Principal Problem:   [facility]:   Polysubstance abuse ([facility]  Substance-induced disorder ([facility]   Plan  ## Safety and Observation Level:  - Based on my clinical evaluation, I estimate the patient to be at high risk of self harm in the current setting - At this time, we recommend a 1:1 level of observation. This decision is based on my review of the chart including patient's history and current presentation, interview of the patient, mental  status examination, and consideration of suicide risk including evaluating suicidal ideation, plan, intent, suicidal or self-harm behaviors, risk factors, and protective factors. This judgment is based on our ability to directly address suicide risk, implement suicide prevention strategies and develop a safety plan while the patient is in the clinical setting. Please contact our team if there is a concern that risk level has changed.  ## Medications:  - Continue home baclofen as prescribed - Continue home gabapentin as prescribed - Continue home lamotrigine as prescribed - Continue home Trileptal as prescribed - Continue Remeron 15 mg nightly - Continue home Seroquel as prescribed  ## Medical Decision Making Capacity:  Not assessed on this encounter  ## Further Work-up:  Per primary   -- most recent EKG on 11/30 had QtC of 412  ## Disposition:  -- inpatient psychiatric hospital  -- [facility], SLP re-evaluated and patient no longer needs further SLP f/u or diet modification at this time.   ## Behavioral / Environmental:  -- 1:1   ##Legal Status -Voluntary vs. Involuntary   Thank you for this consult request. Recommendations have been communicated to the primary team.  We will follow at this time.   StRolanda LundborgMD   Followup history  Relevant Aspects of Hospital Course:  Admitted on 01/05/2022 for cardiac arrest.  Patient Report:  Mental status exam is notable for the patient being fully alert and oriented with intact attention on testing.   She reports using drugs starting at [age]ears old when she began using IV fentanyl.  She reports having an overdose and then a stroke.  She reports stopping fentanyl after that but then started  using crack cocaine.  She reports concomitant marijuana use.  She reports that the cocaine use started 9 months ago and that prior she had several months of sobriety.  When asked why she uses  drugs, the patient reports "I am bored and I do not feel like if in and I found friends with drugs".  The patient reports she feels like a burden on her family.  He denies experiencing serious depression recently.  She denies ever attempting suicide previously.  She reports occasional passive suicidal thoughts.  She denies experiencing suicidal thoughts recently.   12/1 The patient states that it is good to meet this provider.  I informed her that we met yesterday and spoke at length.  She does not remember this conversation.  Discussed with her the collateral that her parents provided and told her about the necessity of involuntary commitment.  She expressed understanding.  She asked appropriate questions about the behavioral health hospital which were answered.  She denies experiencing any suicidal or homicidal thoughts.  She denies experiencing auditory or visual hallucinations.  12/4 Patient reports that she is eating and sleeping well. She is anxious to get out of the hospital and go to inpatient psychiatric hospital. She asks about [facility] treatment center as an inpatient psychiatric facility. She reports feeling "gloomy" being stuck in the medical hospital and feeling alone and isolated since she does not have access to her phone. She recalls leaving the treatment center and the ride home. She reports she does not remember obtaining drugs. She denies her drug overdose was a suicide attempt. Discussed that at this point she is being recommended inpatient psychiatric admission given concerning collateral. She currently denies SI/HI. She denies auditory or visual hallucinations.   ROS:  As above   Collateral information:  Aunt present at bedside. Aunt also asks about her medication regimen. They both state that she was not taking seroquel before.   Psychiatric History:  Information collected from patient, EMR  Per chart review, patient is prescribed buspar for anxiety and lamictal for history  of seizure disorder. Seroquel was being managed by psychiatrist, also noted for insomnia per PM&R note.   She was seen by Dr. Mallie Darting in 2020, plan at the time was seroquel 100 nightly, 25 daily, and prozac 20 daily.   Family psych history: none  Social History:  As above   Family History:  Family pysch history: none   The patient's family history includes Anemia in her father; Healthy in her sister; Lung cancer in her maternal grandfather and paternal grandfather; Thyroid cancer in her maternal grandmother.  Medical History: Past Medical History:  Diagnosis Date   AKI (acute kidney injury) ([place]) 2018   "from overdose"   Anxiety    Chlamydia    Daily headache    Depression    Drug overdose 04/2016   [name] 05/01/2016   GERD (gastroesophageal reflux disease)    "when I was younger; gone now" (09/21/2017)   IV drug abuse ([place]) 09/20/2017   Migraine    "q couple weeks" (09/21/2017)   Opioid abuse ([place])    Overdose    Stroke [facility])     Surgical History: Past Surgical History:  Procedure Laterality Date   APPENDECTOMY  04/2013   I & D EXTREMITY Left 09/20/2017   Procedure: IRRIGATION AND DEBRIDEMENT LEFT ARM;  Surgeon: [name], MD;  Location: [facility];  Service: Orthopedics;  Laterality: Left;   TEE WITHOUT CARDIOVERSION N/A 02/10/2019   Procedure: TRANSESOPHAGEAL ECHOCARDIOGRAM (TEE);  Surgeon: [name], MD;  Location: [facility] ORS;  Service: Cardiovascular;  Laterality: N/A;  Polysubstance abuser   TONGUE SURGERY  ~ 2007   "related to lisp"   TRACHEOSTOMY  04/2016   [name] 05/01/2016    Medications:   Current Facility-Administered Medications:    0.9 %  sodium chloride infusion, 250 mL, Intravenous, Continuous, Erick Colace, NP, Stopped at 01/07/22 1025   acetaminophen (TYLENOL) tablet 650 mg, 650 mg, Oral, Q6H PRN, 650 mg at 01/11/22 2200 **OR** acetaminophen (TYLENOL) 160 MG/5ML solution 650 mg, 650 mg, Per Tube, Q6H PRN **OR** acetaminophen (TYLENOL)  suppository 650 mg, 650 mg, Rectal, Q6H PRN, Virl Axe, MD   baclofen (LIORESAL) tablet 10 mg, 10 mg, Oral, TID, Domenic Polite, MD, 10 mg at 01/13/22 0928   busPIRone (BUSPAR) tablet 10 mg, 10 mg, Oral, TID, Domenic Polite, MD, 10 mg at 01/13/22 4580   Chlorhexidine Gluconate Cloth 2 % PADS 6 each, 6 each, Topical, Daily, Ramaswamy, Murali, MD, 6 each at 01/09/22 0939   docusate sodium (COLACE) capsule 100 mg, 100 mg, Oral, BID, Ventura Sellers, RPH, 100 mg at 01/12/22 2118   enoxaparin (LOVENOX) injection 40 mg, 40 mg, Subcutaneous, Q24H, Mannam, Praveen, MD, 40 mg at 01/13/22 9983   gabapentin (NEURONTIN) capsule 300 mg, 300 mg, Oral, QHS, Ventura Sellers, RPH, 300 mg at 01/12/22 2118   lamoTRIgine (LAMICTAL) tablet 25 mg, 25 mg, Oral, BID, Ventura Sellers, RPH, 25 mg at 01/13/22 0930   menthol-cetylpyridinium (CEPACOL) lozenge 3 mg, 1 lozenge, Oral, PRN, Domenic Polite, MD, 3 mg at 01/08/22 2120   mirtazapine (REMERON SOL-TAB) disintegrating tablet 15 mg, 15 mg, Oral, QHS, Domenic Polite, MD, 15 mg at 01/12/22 2119   nicotine (NICODERM CQ - dosed in mg/24 hours) patch 14 mg, 14 mg, Transdermal, Daily, Domenic Polite, MD, 14 mg at 01/13/22 0929   ondansetron (ZOFRAN) injection 4 mg, 4 mg, Intravenous, Q6H PRN, Erick Colace, NP   OXcarbazepine (TRILEPTAL) tablet 150 mg, 150 mg, Oral, BID, Mannam, Praveen, MD, 150 mg at 01/13/22 0928   polyethylene glycol (MIRALAX / GLYCOLAX) packet 17 g, 17 g, Oral, Daily, Ventura Sellers, RPH, 17 g at 01/10/22 0901   QUEtiapine (SEROQUEL) tablet 50 mg, 50 mg, Oral, QHS, Domenic Polite, MD, 50 mg at 01/12/22 2118  Allergies: Allergies  Allergen Reactions   Pristiq [Desvenlafaxine Succinate Er] Rash   Desvenlafaxine Rash   Other Rash    Tide detergent  Tide detergent  Tide detergent    Sulfa Antibiotics Rash       Objective  Vital signs:  Temp:  [98 F (36.7 C)-98.6 F (37 C)] 98 F (36.7 C) (12/04 0505) Pulse Rate:   [76-99] 99 (12/04 0505) Resp:  [16] 16 (12/04 0505) BP: (102-107)/(52-70) 102/52 (12/04 0505) SpO2:  [98 %-100 %] 98 % (12/04 0505) Weight:  [56.8 kg] 56.8 kg (12/04 0647)  Mental Status Exam   Appearance and Grooming: Patient is casually dressed. The patient has no noticeable scent or odor. Motor activity: The patient's movement speed was normal; her gait was normal. There was no notable abnormal facial movements and no notable abnormal extremity movements. Behavior: The patient appears in no acute distress, and during the interview, was calm, focused, required minimal redirection, and behaving appropriately to scenario; she was able to follow commands and compliant to requests and made good eye contact. The patient did not appear internally or externally preoccupied. Attitude: Patient's attitude towards the interviewer was cooperative and open. Speech:  The patient's speech was clear, fluent, with good articulation, and with appropriately placed inflections. The volume of her speech was normal and normal in quantity. The rate was normal with a normal rhythm. Responses were normal in latency. There were no abnormal patterns in speech. Mood: "Gloomy" Affect: Patient's affect is blunted with restricted range and even fluctuations; her affect is congruent with her stated mood. ------------------------------------------------------------------------------------------------------------------------- Thought Content The patient experiences no hallucinations. The patient describes no delusional thoughts; she denies thought insertion, denies thought withdrawal, denies thought interruption, and denies thought broadcasting. Patient at the time of interview denies active suicidal intent and denies passive suicidal ideation; she denies homicidal intent. Thought Process The patient's thought process is linear and is goal-directed. Insight The patient at the time of interview demonstrates poor  insight. Judgement The patient over the past 24 hours demonstrates poor judgement  Memory: Limited  Executive Functions  Concentration: Intact Attention Span: Fair Recall: Poor Fund of Knowledge: not formally assessed  Alertness/Orientation: alert and oriented to self, place, month, year  Assets  Assets: Armed forces logistics/support/administrative officer; Desire for Improvement; Housing; Catering manager; Social Support; Physical Health  Physical Exam Constitutional:      Appearance: the patient is not toxic-appearing.  Pulmonary:     Effort: Pulmonary effort is normal.  Neurological:     General: No focal deficit present.     Mental Status: the patient is alert and oriented to self, place, month, year   Review of Systems  Respiratory:  Negative for shortness of breath.   Cardiovascular:  Negative for chest pain.  Gastrointestinal:  Negative for abdominal pain, constipation, diarrhea, nausea and vomiting.  Neurological:  Negative for headaches.   Blood pressure (!) 102/52, pulse 99, temperature 98 F (36.7 C), temperature source Oral, resp. rate 16, height _0  (1.676 m), weight 56.8 kg, last menstrual period 12/24/2021, SpO2 98 %. Body mass index is 20.21 kg/m.

## 2022-01-14 LAB — COMPREHENSIVE METABOLIC PANEL
ALT: 116 U/L — ABNORMAL HIGH (ref 0–44)
AST: 30 U/L (ref 15–41)
Albumin: 3.8 g/dL (ref 3.5–5.0)
Alkaline Phosphatase: 65 U/L (ref 38–126)
Anion gap: 6 (ref 5–15)
BUN: 18 mg/dL (ref 6–20)
CO2: 22 mmol/L (ref 22–32)
Calcium: 9 mg/dL (ref 8.9–10.3)
Chloride: 111 mmol/L (ref 98–111)
Creatinine, Ser: 0.78 mg/dL (ref 0.44–1.00)
GFR, Estimated: >60 mL/min (ref >60)
Glucose, Bld: 96 mg/dL (ref 70–99)
Potassium: 4.1 mmol/L (ref 3.5–5.1)
Sodium: 139 mmol/L (ref 135–145)
Total Bilirubin: 0.4 mg/dL (ref 0.3–1.2)
Total Protein: 7 g/dL (ref 6.5–8.1)

## 2022-01-14 LAB — GLUCOSE, CAPILLARY
Glucose-Capillary: 80 mg/dL (ref 70–99)
Glucose-Capillary: 96 mg/dL (ref 70–99)
Glucose-Capillary: 127 mg/dL — ABNORMAL HIGH (ref 70–99)
Glucose-Capillary: 96 mg/dL (ref 70–99)
Glucose-Capillary: 88 mg/dL (ref 70–99)

## 2022-01-14 NOTE — Progress Notes (Addendum)
2pm: CSW sent requested documentation for review.  10:30am: CSW spoke with Shaleta at [facility] who states new clinicals are needed to accept patient.  CSW notified MD of request.   Once test results are available, CSW will send updated clinicals to [phone].  Edwin Dada, MSW, LCSW Transitions of Care  Clinical Social Worker II [phone]

## 2022-01-14 NOTE — Care Management Important Message (Signed)
Important Message  Patient Details  Name: Jane Watkins MRN: [id number] Date of Birth: [birth year]   Medicare Important Message Given:  Yes     Clary Boulais Stefan Church 01/14/2022, 3:37 PM

## 2022-01-14 NOTE — Progress Notes (Signed)
Patient is still currently waiting to be transferred to inpatient psychiatric hospital.  She is currently medically stable for discharge.  Please refer to the full discharge summary done by me me on 01/11/2022 for full details.

## 2022-01-14 NOTE — Progress Notes (Signed)
[facility] Followup Face-to-Face Psychiatric Evaluation  Service Date: January 14, 2022 LOS:  LOS: 9 days   Assessment  Jane Watkins is a 26 y.o. female admitted medically for 01/05/2022 12:28 PM for cardiac arrest. She carries the psychiatric diagnoses of cocaine use disorder, marijuana use disorder, opioid use disorder, benzodiazapine abuse, depression, anxiety and has a past medical history of R MCA stroke in 2021. Psychiatry was consulted for potential self-harm by Dr. Letta Median.   Her current presentation of overdose in the setting of sending concerning text messages to parents ("I never wanted to hurt anybody but myself") is most consistent with suspected suicide attempt. She meets criteria for IVC based on collateral information from parents. Current outpatient psychotropic medications include seroquel 50 QHS, baclofen 49m TID, gabapentin 300 QHS, lamotrigine 50 daily, trileptal 150 BID, and remeron 135mQHS. On initial examination, there was no evidence that patient intentionally took overdose with intention of harming herself or ending her life and her home medications were continued. With collateral obtained, there was increased concern that patient's ingestion was a suicide attempt and patient was IVC'ed. Patient is pending behavioral health inpatient placement at this time. TOC is aware - appreciate assistance.   12/5: On further chart review, patient appears to have been prescribed seroquel for insomnia and was weaned off. Discussed with patient discontinuing seroquel she was prescribed during this hospitalization and she was amenable. Of note, patient reported today that her last thoughts of SI were the day of her overdose. Would continue to recommend inpatient psychiatric hospitalization for her at this time. We are working with SW on placement.   Please see plan below for detailed recommendations.   Diagnoses:  Active Hospital problems: Principal Problem:   Cardiac  [facility] ([facility]:   [place]ubstance abuse ([place]  Substance-induced disorder ([facility]   Plan  ## Safety and Observation Level:  - Based on my clinical evaluation, I estimate the patient to be at high risk of self harm in the current setting - At this time, we recommend a 1:1 level of observation. This decision is based on my review of the chart including patient's history and current presentation, interview of the patient, mental status examination, and consideration of suicide risk including evaluating suicidal ideation, plan, intent, suicidal or self-harm behaviors, risk factors, and protective factors. This judgment is based on our ability to directly address suicide risk, implement suicide prevention strategies and develop a safety plan while the patient is in the clinical setting. Please contact our team if there is a concern that risk level has changed.  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  - Continue home lamotrigine as prescribed for seizure history (?) - Continue home Trileptal as prescribed for seizure history (?) - Continue Remeron 15 mg nightly as prescribed for insomnia and depression  - STOP home Seroquel given patient was weaned off, was prescribed for insomnia   ## Medical Decision Making Capacity:  Not assessed on this encounter  ## Further Work-up:  Per primary   -- most recent EKG on 11/30 had QtC of 412  ## Disposition:  -- inpatient psychiatric hospital  -- [facility] concern [facility] thick liquid [facility], SLP re-evaluated and patient no longer needs further SLP f/u or diet modification at this time.  -- SW working on disposition, requested repeat CMP   ## Behavioral / Environmental:  --  1:1   ##Legal Status -Involuntary   Thank you for this consult request. Recommendations have been communicated to the primary team.  We will  follow at this time.   Rolanda Lundborg, MD   Followup history  Relevant Aspects of Hospital Course:  Admitted on 01/05/2022 for cardiac arrest.  Patient Report:  Mental status exam is notable for the patient being fully alert and oriented with intact attention on testing.   She reports using drugs starting at 26 years old when she began using IV fentanyl.  She reports having an overdose and then a stroke.  She reports stopping fentanyl after that but then started using crack cocaine.  She reports concomitant marijuana use.  She reports that the cocaine use started 9 months ago and that prior she had several months of sobriety.  When asked why she uses drugs, the patient reports "I am bored and I do not feel like if in and I found friends with drugs".  The patient reports she feels like a burden on her family.  He denies experiencing serious depression recently.  She denies ever attempting suicide previously.  She reports occasional passive suicidal thoughts.  She denies experiencing suicidal thoughts recently.   12/1 The patient states that it is good to meet this provider.  I informed her that we met yesterday and spoke at length.  She does not remember this conversation.  Discussed with her the collateral that her parents provided and told her about the necessity of involuntary commitment.  She expressed understanding.  She asked appropriate questions about the behavioral health hospital which were answered.  She denies experiencing any suicidal or homicidal thoughts.  She denies experiencing auditory or visual hallucinations.  12/4 Patient interviewed with aunt at bedside. Patient reports that she is eating and sleeping well. She is anxious to get out of the hospital and go to inpatient psychiatric hospital. She asks about [facility] treatment center as an inpatient psychiatric facility. She reports feeling "gloomy" being stuck in the medical hospital and feeling alone and isolated since she  does not have access to her phone. She recalls leaving the treatment center and the ride home. She reports she does not remember obtaining drugs. She denies her drug overdose was a suicide attempt. Discussed that at this point she is being recommended inpatient psychiatric admission given concerning collateral. She currently denies SI/HI. She denies auditory or visual hallucinations.   12/5 Patient reports she is tired this AM. Reports good sleep. SLP cleared her for regular diet. She reports increase in appetite. She continues to report she "feels stuck in here" and wants to work on herself. Discussed that we are working on inpatient psychiatric hospitalization placement. She denies current SI/HI but when asked the last time she felt SI, she reports it was the day of her overdose. She reports she feels that her mental health contributes to her addiction and if she was in a better place mentally she wouldn't need to be high. She reports coping skills that she has used other than drugs include therapy, talking to her family, and distracting herself. She reports remeron helps with her sleep. She reports intermittent chest pain since she received CPR at time of admission.   ROS:  As above   Collateral information:  Aunt present at bedside. Aunt also asks about her medication regimen. They both state that she was not taking seroquel before.   Psychiatric History:  Information collected from patient, EMR  Per chart review, patient is prescribed  buspar for anxiety and lamictal for history of seizure disorder. Seroquel was being managed by PM&R for insomnia, looks like she was tapering off per 11/6 note. Gabapentin prescribed for spasticity. Lamictal and trileptal looks like are managed by her psychiatrist but also prescribed for h/o seizures. Remeron recently started.    She was seen by Dr. Mallie Darting in 2020, plan at the time was seroquel 100 nightly, 25 daily, and prozac 20 daily.   Family psych history:  none  Social History:  As above   Family History:  Family pysch history: none   The patient's family history includes Anemia in her father; Healthy in her sister; Lung cancer in her maternal grandfather and paternal grandfather; Thyroid cancer in her maternal grandmother.  Medical History: Past Medical History:  Diagnosis Date   AKI (acute kidney injury) ([place]) 2018   "from overdose"   Anxiety    Chlamydia    Daily headache    Depression    Drug overdose 04/2016   [name] 05/01/2016   GERD (gastroesophageal reflux disease)    "when I was younger; gone now" (09/21/2017)   IV drug abuse ([place]) 09/20/2017   Migraine    "q couple weeks" (09/21/2017)   Opioid abuse ([place])    Overdose    Stroke [facility])     Surgical History: Past Surgical History:  Procedure Laterality Date   APPENDECTOMY  04/2013   I & D EXTREMITY Left 09/20/2017   Procedure: IRRIGATION AND DEBRIDEMENT LEFT ARM;  Surgeon: [name], MD;  Location: [facility];  Service: Orthopedics;  Laterality: Left;   TEE WITHOUT CARDIOVERSION N/A 02/10/2019   Procedure: TRANSESOPHAGEAL ECHOCARDIOGRAM (TEE);  Surgeon: [name], MD;  Location: [facility];  Service: Cardiovascular;  Laterality: N/A;  Polysubstance abuser   TONGUE SURGERY  ~ 2007   "related to lisp"   TRACHEOSTOMY  04/2016   [name] 05/01/2016    Medications:   Current Facility-Administered Medications:    0.9 %  sodium chloride infusion, 250 mL, Intravenous, Continuous, Erick Colace, NP, Stopped at 01/07/22 1025   acetaminophen (TYLENOL) tablet 650 mg, 650 mg, Oral, Q6H PRN, 650 mg at 01/11/22 2200 **OR** acetaminophen (TYLENOL) 160 MG/5ML solution 650 mg, 650 mg, Per Tube, Q6H PRN **OR** acetaminophen (TYLENOL) suppository 650 mg, 650 mg, Rectal, Q6H PRN, Virl Axe, MD   baclofen (LIORESAL) tablet 10 mg, 10 mg, Oral, TID, Domenic Polite, MD, 10 mg at 01/14/22 0911   busPIRone (BUSPAR) tablet 10 mg, 10 mg, Oral, TID, Domenic Polite, MD, 10 mg at  01/14/22 0911   Chlorhexidine Gluconate Cloth 2 % PADS 6 each, 6 each, Topical, Daily, Ramaswamy, Murali, MD, 6 each at 01/09/22 0939   docusate sodium (COLACE) capsule 100 mg, 100 mg, Oral, BID, Ventura Sellers, RPH, 100 mg at 01/14/22 0911   enoxaparin (LOVENOX) injection 40 mg, 40 mg, Subcutaneous, Q24H, Mannam, Praveen, MD, 40 mg at 01/13/22 5449   gabapentin (NEURONTIN) capsule 300 mg, 300 mg, Oral, QHS, Ventura Sellers, RPH, 300 mg at 01/13/22 2150   lamoTRIgine (LAMICTAL) tablet 25 mg, 25 mg, Oral, BID, Ventura Sellers, RPH, 25 mg at 01/14/22 2010   menthol-cetylpyridinium (CEPACOL) lozenge 3 mg, 1 lozenge, Oral, PRN, Domenic Polite, MD, 3 mg at 01/08/22 2120   mirtazapine (REMERON SOL-TAB) disintegrating tablet 15 mg, 15 mg, Oral, QHS, Domenic Polite, MD, 15 mg at 01/13/22 2151   nicotine (NICODERM CQ - dosed in mg/24 hours) patch 14 mg, 14 mg, Transdermal, Daily, Domenic Polite, MD, 14 mg  at 01/14/22 0911   ondansetron (ZOFRAN) injection 4 mg, 4 mg, Intravenous, Q6H PRN, Erick Colace, NP   OXcarbazepine (TRILEPTAL) tablet 150 mg, 150 mg, Oral, BID, Mannam, Praveen, MD, 150 mg at 01/14/22 0911   polyethylene glycol (MIRALAX / GLYCOLAX) packet 17 g, 17 g, Oral, Daily, Ventura Sellers, RPH, 17 g at 01/10/22 0901   QUEtiapine (SEROQUEL) tablet 50 mg, 50 mg, Oral, QHS, Domenic Polite, MD, 50 mg at 01/13/22 2150  Allergies: Allergies  Allergen Reactions   Pristiq [Desvenlafaxine Succinate Er] Rash   Desvenlafaxine Rash   Other Rash    Tide detergent  Tide detergent  Tide detergent    Sulfa Antibiotics Rash       Objective  Vital signs:  Temp:  [97.4 F (36.3 C)-98.4 F (36.9 C)] 98.4 F (36.9 C) (12/05 0743) Pulse Rate:  [75-92] 80 (12/05 0743) Resp:  [16-18] 18 (12/05 0743) BP: (97-136)/(40-116) 114/40 (12/05 0743) SpO2:  [98 %-100 %] 99 % (12/05 0743) Weight:  [58.8 kg] 58.8 kg (12/05 7517)  Mental Status Exam   Appearance and Grooming: Patient is  casually dressed. The patient has no noticeable scent or odor. Motor activity: The patient's movement speed was normal; her gait was not observed during encounter. There was no notable abnormal facial movements and no notable abnormal extremity movements. Behavior: The patient appears in no acute distress, and during the interview, was calm, focused, required minimal redirection, and behaving appropriately to scenario; she was able to follow commands and compliant to requests and made good eye contact. The patient did not appear internally or externally preoccupied. Attitude: Patient's attitude towards the interviewer was cooperative and open. Speech: The patient's speech was clear, fluent, with good articulation, and with appropriately placed inflections. The volume of her speech was normal and normal in quantity. The rate was normal with a normal rhythm. Responses were normal in latency. There were no abnormal patterns in speech. Mood: "Feel stuck in here" Affect: Patient's affect is blunted with restricted range and even fluctuations; her affect is congruent with her stated mood. ------------------------------------------------------------------------------------------------------------------------- Thought Content The patient experiences no hallucinations. The patient describes no delusional thoughts; she denies thought insertion, denies thought withdrawal, denies thought interruption, and denies thought broadcasting. Patient at the time of interview denies active suicidal intent and denies passive suicidal ideation; she denies homicidal intent. Thought Process The patient's thought process is linear and is goal-directed. Insight The patient at the time of interview demonstrates fair insight. Judgement The patient over the past 24 hours demonstrates fair judgement  Memory: Limited  Executive Functions  Concentration: Intact Attention Span: Fair Recall: Poor Fund of Knowledge: not  formally assessed  Alertness/Orientation: alert and oriented to self, place, month, year  Assets  Assets: Armed forces logistics/support/administrative officer; Desire for Improvement; Housing; Catering manager; Social Support; Physical Health  Physical Exam Constitutional:      Appearance: the patient is not toxic-appearing.  Pulmonary:     Effort: Pulmonary effort is normal.  Neurological:     General: No focal deficit present.     Mental Status: the patient is alert and oriented to self, place, month, year   Review of Systems  Respiratory:  Negative for shortness of breath.   Cardiovascular:  Positive for intermittent chest pain.  Gastrointestinal:  Negative for abdominal pain, constipation, diarrhea, nausea and vomiting.  Neurological:  Negative for headaches.   Blood pressure (!) 114/40, pulse 80, temperature 98.4 F (36.9 C), temperature source Oral, resp. rate 18, height _0  (1.676  m), weight 58.8 kg, last menstrual period 12/24/2021, SpO2 99 %. Body mass index is 20.92 kg/m.

## 2022-01-15 LAB — GLUCOSE, CAPILLARY
Glucose-Capillary: 134 mg/dL — ABNORMAL HIGH (ref 70–99)
Glucose-Capillary: 86 mg/dL (ref 70–99)
Glucose-Capillary: 85 mg/dL (ref 70–99)
Glucose-Capillary: 100 mg/dL — ABNORMAL HIGH (ref 70–99)
Glucose-Capillary: 82 mg/dL (ref 70–99)

## 2022-01-15 NOTE — Progress Notes (Signed)
PROGRESS NOTE    Jane Watkins  FEO:[id number] DOB: 1995-04-17 DOA: 01/05/2022 PCP: Gweneth Dimitri, MD    Brief Narrative:   Jane Watkins is a 26 y.o. 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  She received CPR by EMTs and ROSC was achieved following 20 minutes and she was brought to the ED for further evaluation.  Patient was initially admitted to the intensive care unit, eventually extubated on 11/27 and transferred to the hospital service on 11/29.  Psychiatry was consulted and is recommending transfer to inpatient psychiatric service now that patient is medically stable.    Patient is currently medically stable for discharge to inpatient psych unit once bed available.  Patient is currently under IVC per psychiatry recommendations.  Assessment & Plan:   Acute hypoxic and toxic metabolic encephalopathy Cardiac arrest, asystole Etiology likely secondary to cardiac arrest following fentanyl and cocaine overdose.  Was initially requiring resuscitative efforts with achieving ROSC after 20 minutes and was initially admitted to the PCCM/ICU service unsuccessfully x-ray on 11/27.  Mental status is much improved during hospitalization, remains slow to respond to questions but answers them appropriately. --Continue supportive care  Polysubstance abuse, current PTSD/depression Suicidal ideation Patient reports suicidal thoughts on the day of admission in which she was admitted following resuscitative efforts after being found down, asystole after overdose on fentanyl and cocaine.  Psychiatry following, currently on IVC.  Psychiatry recommending transfer to inpatient psychiatric service  once bed available. -- Psychiatry following, appreciate assistance -- BuSpar 10 mg p.o. 3 times daily -- Gabapentin 3 mg p.o. nightly -- Seroquel 50 mg p.o. nightly -- Medically stable for transfer once psychiatric bed available -- Continue IVC, will need to be renewed on 12/8 -- Continue one-to-one sitter  Acute hypoxic respiratory failure, POA: Resolved Aspiration pneumonia Etiology secondary to overdose and aspiration event during episode.  Patient completed 5-day course of Unasyn.  Currently on room air.  Severe metabolic acidosis: Resolved  Shock liver Liver enzymes down trending, etiology likely secondary to poor perfusion following cardiopulmonary arrest.  Continue intermittent monitoring of liver functions.  Avoid hepatotoxins.  Hypoglycemic episode: Resolved Etiology likely secondary to cardiac arrest which is now improved, blood sugars have been stable and will now discontinued CBG checks.  Hx seizures Trileptal 150 mg p.o. twice daily, Lamictal 25 mg p.o. twice daily  Hypomagnesemia: Repleted during hospitalization   DVT prophylaxis: enoxaparin (LOVENOX) injection 40 mg Start: 01/06/22 0930 SCDs Start: 01/05/22 1405    Code Status: Full Code Family Communication: No family present at bedside this morning  Disposition Plan:  Level of care: Med-Surg Status is: Inpatient Remains inpatient appropriate because: Medically stable for transfer to inpatient psychiatry once bed available    Consultants:  PCCM Psychiatry  Procedures:  EEG 11/24  Antimicrobials:  Unasyn 11/27 - 12/1   Subjective:  Patient seen examined bedside, resting comfortably.  Sleeping but easy arousable.  Sitter present.  Complaining of difficulty hearing out of right ear.  Psych Arta Silence continues to follow.  Discussed with social work this morning.  Medically stable for discharge to inpatient psychiatry once bed available.  Remains on IVC.  Objective: Vitals:   01/14/22 2031 01/15/22 0331  01/15/22 0503 01/15/22 0820  BP: 128/77  100/60  112/66  Pulse: 88  75 81  Resp: 17  16 19   Temp: 98.3 F (36.8 C)  98 F (36.7 C) 97.8 F (36.6 C)  TempSrc: Oral  Oral   SpO2: 100%  100% 99%  Weight:  58.9 kg    Height:        Intake/Output Summary (Last 24 hours) at 01/15/2022 1149 Last data filed at 01/14/2022 1845 Gross per 24 hour  Intake 240 ml  Output --  Net 240 ml   Filed Weights   01/13/22 0647 01/14/22 0613 01/15/22 0331  Weight: 56.8 kg 58.8 kg 58.9 kg    Examination:  Physical Exam: GEN: NAD, alert and oriented x 3, chronically ill in appearance HEENT: NCAT, PERRL, EOMI, sclera clear, MMM PULM: CTAB w/o wheezes/crackles, normal respiratory effort on room air CV: RRR w/o M/G/R GI: abd soft, NTND, NABS, no R/G/M MSK: no peripheral edema, moves all limbs independently NEURO: No focal deficits PSYCH: Depressed mood, flat affect Integumentary: dry/intact, no rashes or wounds    Data Reviewed: I have personally reviewed following labs and imaging studies  CBC: Recent Labs  Lab 01/10/22 0606  WBC 6.4  HGB 11.5*  HCT 33.9*  MCV 89.0  PLT 254   Basic Metabolic Panel: Recent Labs  Lab 01/09/22 0816 01/10/22 0606 01/11/22 0338 01/14/22 1107  NA 147* 144 144 139  K 3.8 3.6 3.7 4.1  CL 117* 118* 116* 111  CO2 23 21* 21* 22  GLUCOSE 90 113* 99 96  BUN <5* 11 15 18   CREATININE 0.65 0.72 0.77 0.78  CALCIUM 8.7* 8.7* 8.6* 9.0  MG  --  1.6* 1.9  --    GFR: Estimated Creatinine Clearance: 99.1 mL/min (by C-G formula based on SCr of 0.78 mg/dL). Liver Function Tests: Recent Labs  Lab 01/09/22 0816 01/10/22 0606 01/14/22 1107  AST 44* 24 30  ALT 509* 307* 116*  ALKPHOS 50 43 65  BILITOT 0.8 0.4 0.4  PROT 5.7* 5.5* 7.0  ALBUMIN 3.2* 2.9* 3.8   No results for input(s): "LIPASE", "AMYLASE" in the last 168 hours. No results for input(s): "AMMONIA" in the last 168 hours. Coagulation Profile: No results for input(s): "INR", "PROTIME" in the last  168 hours. Cardiac Enzymes: No results for input(s): "CKTOTAL", "CKMB", "CKMBINDEX", "TROPONINI" in the last 168 hours. BNP (last 3 results) No results for input(s): "PROBNP" in the last 8760 hours. HbA1C: No results for input(s): "HGBA1C" in the last 72 hours. CBG: Recent Labs  Lab 01/14/22 1519 01/14/22 2042 01/15/22 0108 01/15/22 0821 01/15/22 1131  GLUCAP 96 88 134* 86 85   Lipid Profile: No results for input(s): "CHOL", "HDL", "LDLCALC", "TRIG", "CHOLHDL", "LDLDIRECT" in the last 72 hours. Thyroid Function Tests: No results for input(s): "TSH", "T4TOTAL", "FREET4", "T3FREE", "THYROIDAB" in the last 72 hours. Anemia Panel: No results for input(s): "VITAMINB12", "FOLATE", "FERRITIN", "TIBC", "IRON", "RETICCTPCT" in the last 72 hours. Sepsis Labs: No results for input(s): "PROCALCITON", "LATICACIDVEN" in the last 168 hours.  Recent Results (from the past 240 hour(s))  Urine Culture     Status: None   Collection Time: 01/05/22  4:17 PM   Specimen: Urine, Catheterized  Result Value Ref Range Status   Specimen Description URINE, CATHETERIZED  Final   Special Requests NONE  Final   Culture   Final    NO GROWTH Performed at [facility], [address], [place], [address] [place]    Report Status 01/06/2022 FINAL  Final  MRSA Next Gen by  PCR, Nasal     Status: None   Collection Time: 01/05/22  4:29 PM   Specimen: Nasal Mucosa; Nasal Swab  Result Value Ref Range Status   MRSA by PCR Next Gen NOT DETECTED NOT DETECTED Final    Comment: (NOTE) The GeneXpert MRSA Assay (FDA approved for NASAL specimens only), is one component of a comprehensive MRSA colonization surveillance program. It is not intended to diagnose MRSA infection nor to guide or monitor treatment for MRSA infections. Test performance is not FDA approved in patients less than 60 years old. Performed at [facility], [address] [place], [place] [address]   Culture, blood (Routine X 2) w  Reflex to ID Panel     Status: None   Collection Time: 01/06/22  9:02 AM   Specimen: BLOOD LEFT ARM  Result Value Ref Range Status   Specimen Description BLOOD LEFT ARM  Final   Special Requests IN PEDIATRIC BOTTLE Blood Culture adequate volume  Final   Culture   Final    NO GROWTH 5 DAYS Performed at [facility], [address] [place], [place] [address]    Report Status 01/11/2022 FINAL  Final  Culture, blood (Routine X 2) w Reflex to ID Panel     Status: None   Collection Time: 01/06/22  9:02 AM   Specimen: BLOOD LEFT ARM  Result Value Ref Range Status   Specimen Description BLOOD LEFT ARM  Final   Special Requests   Final    BOTTLES DRAWN AEROBIC ONLY Blood Culture results may not be optimal due to an inadequate volume of blood received in culture bottles   Culture   Final    NO GROWTH 5 DAYS Performed at [facility], [address] [place], [place] [address]    Report Status 01/11/2022 FINAL  Final  SARS Coronavirus 2 by RT PCR (hospital order, performed in [facility] hospital lab) *cepheid single result test* Anterior Nasal Swab     Status: None   Collection Time: 01/11/22  2:28 PM   Specimen: Anterior Nasal Swab  Result Value Ref Range Status   SARS Coronavirus 2 by RT PCR NEGATIVE NEGATIVE Final    Comment: Performed at [facility], [address] [place], [place] [address]         Radiology Studies: No results found.      Scheduled Meds:  baclofen  10 mg Oral TID   busPIRone  10 mg Oral TID   Chlorhexidine Gluconate Cloth  6 each Topical Daily   docusate sodium  100 mg Oral BID   enoxaparin (LOVENOX) injection  40 mg Subcutaneous Q24H   gabapentin  300 mg Oral QHS   lamoTRIgine  25 mg Oral BID   mirtazapine  15 mg Oral QHS   nicotine  14 mg Transdermal Daily   OXcarbazepine  150 mg Oral BID   polyethylene glycol  17 g Oral Daily   QUEtiapine  50 mg Oral QHS   Continuous Infusions:  sodium chloride Stopped (01/07/22 1025)      LOS: 10 days    Time spent: 36 minutes spent on chart review, discussion with nursing staff, consultants, updating family and interview/physical exam; more than 50% of that time was spent in counseling and/or coordination of care.    Alvira Philips [place], DO [facility] Available via Epic secure chat 7am-7pm After these hours, please refer to coverage provider listed on [email or web address] 01/15/2022, 11:49 AM

## 2022-01-15 NOTE — TOC Progression Note (Signed)
Transition of Care [facility]) - Progression Note    Patient Details  Name: Glendene Wyer MRN: [id number] Date of Birth: October 08, 1995  [facility] CM/SW Contact  Janae Bridgeman, RN Phone Number: 01/15/2022, 8:27 AM  Clinical Narrative:    CM spoke with Dr. Standley Brooking this morning and updated CMP faxed to [facility] at [facility] to add to clinicals for review.  CM and MSW with [facility] Team will continue to follow for Inpatient psychiatry bed needs.   Expected Discharge Plan: Home/Self Care Barriers to Discharge: Continued Medical Work up  Expected Discharge Plan and Services Expected Discharge Plan: Home/Self Care   Discharge Planning Services: CM Consult   Living arrangements for the past 2 months: Single Family Home Expected Discharge Date: 01/13/22                                     Social Determinants of Health (SDOH) Interventions    Readmission Risk Interventions    01/09/2022    3:02 PM  Readmission Risk Prevention Plan  Transportation Screening Complete  PCP or Specialist Appt within 5-7 Days Complete  Home Care Screening Complete  Medication Review (RN CM) Complete

## 2022-01-15 NOTE — Progress Notes (Addendum)
12pm: CSW was notified by MD that patient has previously received substance abuse treatment at [facility] and is willing to return if able.  CSW spoke with Tammy at [facility] to discuss the possibility of patient returning to the facility. Tammy states the patient was at the facility in November 2023 for 10 days. Tammy states that due to the patient being hospitalized for the past 9 days she is out of the detox window and would not need detox at the facility which would have her being placed into the clinical services unit with a max benefit of 7 days of treatment with her current insurance benefits. Per Tammy, Medicare views mental health diagnoses as primary for patients hospitalized greater than 7 days in an acute setting.  10:30am: CSW spoke with Tiffany at [facility] who requested clinicals be sent for review via fax. CSW sent clinicals to [phone].  8:55am: CSW received return call from [place] at [facility] who states patient has been declined admission.  CSW sent clinicals to additional facilities for review.  8:30am: CSW spoke with admissions at [facility] who states she will return call to CSW in 30 minutes to provide status of patient's acceptance or denial.  Edwin Dada, MSW, LCSW Transitions of Care  Clinical Social Worker II [phone]

## 2022-01-15 NOTE — Progress Notes (Signed)
[facility] Followup Face-to-Face Psychiatric Evaluation  Service Date: January 15, 2022 LOS:  LOS: 10 days   Assessment  Jane Watkins is a 26 y.o. female admitted medically for 01/05/2022 12:28 PM for cardiac arrest. She carries the psychiatric diagnoses of cocaine use disorder, marijuana use disorder, opioid use disorder, benzodiazapine abuse, depression, anxiety and has a past medical history of R MCA stroke in 2021. Psychiatry was consulted for potential self-harm by Dr. Letta Median.   Her current presentation of overdose in the setting of sending concerning text messages to parents ("I never wanted to hurt anybody but myself") is most consistent with suspected suicide attempt. She meets criteria for IVC based on collateral information from parents. Current outpatient psychotropic medications include seroquel 50 QHS, baclofen 73m TID, gabapentin 300 QHS, lamotrigine 50 daily, trileptal 150 BID, and remeron 140mQHS. On initial examination, there was no evidence that patient intentionally took overdose with intention of harming herself or ending her life and her home medications were continued. With collateral obtained, there was increased concern that patient's ingestion was a suicide attempt and patient was IVC'ed. Patient is pending behavioral health inpatient placement at this time. TOC is aware - appreciate assistance.   12/5: On further chart review, patient appears to have been prescribed seroquel for insomnia and was weaned off. Discussed with patient discontinuing seroquel she was prescribed during this hospitalization and she was amenable. Of note, patient reported today that her last thoughts of SI were the day of her overdose. Would continue to recommend inpatient psychiatric hospitalization for her at this time. We are working with SW on placement.   12/6: Patient continues to report good sleep and appetite after discontinuing seroquel yesterday. Affect still flat,  suspect continued difficulties with placement contributing to depressed mood and feelings of isolation during this hospital stay. Continues to report last SI was day of overdose - will continue 1:1 for now. Continuing to work on placement for patient.   Please see plan below for detailed recommendations.   Diagnoses:  Active Hospital problems: Principal Problem:   [facility]:   [place]ubstance [facility] ([facility]  Substance-induced disorder ([facility]   Plan  ## Safety and Observation Level:  - Based on my clinical evaluation, I estimate the patient to be at high risk of self harm in the current setting - At this time, we recommend a 1:1 level of observation. This decision is based on my review of the chart including patient's history and current presentation, interview of the patient, mental status examination, and consideration of suicide risk including evaluating suicidal ideation, plan, intent, suicidal or self-harm behaviors, risk factors, and protective factors. This judgment is based on our ability to directly address suicide risk, implement suicide prevention strategies and develop a safety plan while the patient is in the clinical setting. Please contact our team if there is a concern that risk level has changed.  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  -- PT notes no PT follow-up -- SW continuing to work on disposition, appreciate assistance. She would be an ideal candidate for inpatient substance use rehab if continues to not be accepted anywhere for inpatient psychiatric hospitalization.    ## Behavioral / Environmental:  -- 1:1   ##Legal Status -Involuntary   Thank you for this consult request. Recommendations have been communicated to the primary team.  We will follow at this time.   Haston Casebolt, MD, PGY-1   Followup history  Relevant Aspects of Hospital Course:  Admitted on 01/05/2022 for cardiac arrest.  Patient Report:  Mental status exam is notable for the patient being fully alert and oriented with intact attention on testing.   She reports using drugs starting at 26 years old when she began using IV fentanyl.  She reports having an overdose and then a stroke.  She reports stopping fentanyl after that but then started using crack cocaine.  She reports concomitant marijuana use.  She reports that the cocaine use started 9 months ago and that prior she had several months of sobriety.  When asked why she uses drugs, the patient reports "I am bored and I do not feel like if in and I found friends with drugs".  The patient reports she feels like a burden on her family.  He denies experiencing serious depression recently.  She denies ever attempting suicide previously.  She reports occasional passive suicidal thoughts.  She denies experiencing suicidal thoughts recently.   12/1 The patient states that it is good to meet this provider.  I informed her that we met yesterday and spoke at length.  She does not remember this conversation.  Discussed with her the collateral that her parents provided and told her about the necessity of involuntary commitment.  She expressed understanding.  She asked appropriate questions about the behavioral health hospital which  were answered.  She denies experiencing any suicidal or homicidal thoughts.  She denies experiencing auditory or visual hallucinations.  12/4 Patient interviewed with aunt at bedside. Patient reports that she is eating and sleeping well. She is anxious to get out of the hospital and go to inpatient psychiatric hospital. She asks about [facility] as an inpatient psychiatric facility. She reports feeling "gloomy" being stuck in the medical hospital and feeling alone and isolated since she does not have access to her phone. She recalls leaving the treatment center and the ride home. She reports she does not remember obtaining drugs. She denies her drug overdose was a suicide attempt. Discussed that at this point she is being recommended inpatient psychiatric admission given concerning collateral. She currently denies SI/HI. She denies auditory or visual hallucinations.   12/5 Patient reports she is tired this AM. Reports good sleep. SLP cleared her for regular diet. She reports increase in appetite. She continues to report she "feels stuck in here" and wants to work on herself. Discussed that we are working on inpatient psychiatric hospitalization placement. She denies current SI/HI but when asked the last time she felt SI, she reports it was the day of her overdose. She reports she feels that her mental health contributes to her addiction and if she was in a better place mentally she wouldn't need to be high. She reports coping skills that she has used other than   drugs include therapy, talking to her family, and distracting herself. She reports remeron helps with her sleep. She reports intermittent chest pain since she received CPR at time of admission.   12/6: Patient continues to report that she is tired this AM. Continues reporting good sleep and appetite. Some residual chest soreness due to getting CPR at time of admission. Also reports some hearing difficulties, noted since admission.  Mood is still "sad that I'm in here by myself." Feels like her mood would improve if she left the hospital. Discussed her drug use and she states that the "urge will still be there but I can't do it because my body can't take it." She denies current cravings. She states that to prevent drug use in the future she will "redirect my thoughts and time so that I'm not obsessed about it" and will "find new friends." She denies SI/HI. States that her last SI was day of overdose. She denies AVH. While it is documented that patient declined to have her morning BG taken, sitter at bedside reports that patient did not decline, she was sleeping and staff did not want to awaken her. Patient reports her overdose was in the context of her friend bringing drugs to [facility]. She would like to go back to [facility] and then to halfway house afterwards if unable to go to inpatient psychiatric hospital.   ROS:  As above   Collateral information:  Aunt present at bedside 12/4. Aunt also asks about her medication regimen. They both state that she was not taking seroquel before.   Psychiatric History:  Information collected from patient, EMR  Per chart review, patient is prescribed buspar for anxiety and lamictal for history of seizure disorder. Seroquel was being managed by PM&R for insomnia, looks like she was tapering off per 11/6 note. Gabapentin prescribed for spasticity. Lamictal and trileptal looks like are managed by her psychiatrist but also prescribed for h/o seizures. Remeron recently started.    She was seen by Dr. Clary in 2020, plan at the time was seroquel 100 nightly, 25 daily, and prozac 20 daily.   Family psych history: none  Social History:  As above   Family History:  Family pysch history: none   The patient's family history includes Anemia in her father; Healthy in her sister; Lung cancer in her maternal grandfather and paternal grandfather; Thyroid cancer in  her maternal grandmother.  Medical History: Past Medical History:  Diagnosis Date   AKI (acute kidney injury) (HCC) 2018   "from overdose"   Anxiety    Chlamydia    Daily headache    Depression    Drug overdose 04/2016   /notes 05/01/2016   GERD (gastroesophageal reflux disease)    "when I was younger; gone now" (09/21/2017)   IV drug abuse (HCC) 09/20/2017   Migraine    "q couple weeks" (09/21/2017)   Opioid abuse (HCC)    Overdose    Stroke (HCC)     Surgical History: Past Surgical History:  Procedure Laterality Date   APPENDECTOMY  04/2013   I & D EXTREMITY Left 09/20/2017   Procedure: IRRIGATION AND DEBRIDEMENT LEFT ARM;  Surgeon: Ortmann, Fred, MD;  Location: [facility] OR;  Service: Orthopedics;  Laterality: Left;   TEE WITHOUT CARDIOVERSION N/A 02/10/2019   Procedure: TRANSESOPHAGEAL ECHOCARDIOGRAM (TEE);  Surgeon: Agbor-Etang, Brian, MD;  Location: [facility] ORS;  Service: Cardiovascular;  Laterality: N/A;  Polysubstance abuser   TONGUE SURGERY  ~ 2007   "related to   lisp"   TRACHEOSTOMY  04/2016   /notes 05/01/2016    Medications:   Current Facility-Administered Medications:    0.9 %  sodium chloride infusion, 250 mL, Intravenous, Continuous, Babcock, Peter E, NP, Stopped at 01/07/22 1025   acetaminophen (TYLENOL) tablet 650 mg, 650 mg, Oral, Q6H PRN, 650 mg at 01/11/22 2200 **OR** acetaminophen (TYLENOL) 160 MG/5ML solution 650 mg, 650 mg, Per Tube, Q6H PRN **OR** acetaminophen (TYLENOL) suppository 650 mg, 650 mg, Rectal, Q6H PRN, Jinwala, Sagar, MD   baclofen (LIORESAL) tablet 10 mg, 10 mg, Oral, TID, Joseph, Preetha, MD, 10 mg at 01/14/22 2215   busPIRone (BUSPAR) tablet 10 mg, 10 mg, Oral, TID, Joseph, Preetha, MD, 10 mg at 01/14/22 2216   Chlorhexidine Gluconate Cloth 2 % PADS 6 each, 6 each, Topical, Daily, Ramaswamy, Murali, MD, 6 each at 01/09/22 0939   docusate sodium (COLACE) capsule 100 mg, 100 mg, Oral, BID, Wilson, Heather W, RPH, 100 mg at 01/14/22 2216   enoxaparin  (LOVENOX) injection 40 mg, 40 mg, Subcutaneous, Q24H, Mannam, Praveen, MD, 40 mg at 01/13/22 0928   gabapentin (NEURONTIN) capsule 300 mg, 300 mg, Oral, QHS, Wilson, Heather W, RPH, 300 mg at 01/14/22 2216   lamoTRIgine (LAMICTAL) tablet 25 mg, 25 mg, Oral, BID, Wilson, Heather W, RPH, 25 mg at 01/14/22 2216   menthol-cetylpyridinium (CEPACOL) lozenge 3 mg, 1 lozenge, Oral, PRN, Joseph, Preetha, MD, 3 mg at 01/08/22 2120   mirtazapine (REMERON SOL-TAB) disintegrating tablet 15 mg, 15 mg, Oral, QHS, Joseph, Preetha, MD, 15 mg at 01/14/22 2216   nicotine (NICODERM CQ - dosed in mg/24 hours) patch 14 mg, 14 mg, Transdermal, Daily, Joseph, Preetha, MD, 14 mg at 01/14/22 0911   ondansetron (ZOFRAN) injection 4 mg, 4 mg, Intravenous, Q6H PRN, Babcock, Peter E, NP   OXcarbazepine (TRILEPTAL) tablet 150 mg, 150 mg, Oral, BID, Mannam, Praveen, MD, 150 mg at 01/14/22 2215   polyethylene glycol (MIRALAX / GLYCOLAX) packet 17 g, 17 g, Oral, Daily, Wilson, Heather W, RPH, 17 g at 01/10/22 0901   QUEtiapine (SEROQUEL) tablet 50 mg, 50 mg, Oral, QHS, Joseph, Preetha, MD, 50 mg at 01/14/22 2216  Allergies: Allergies  Allergen Reactions   Pristiq [Desvenlafaxine Succinate Er] Rash   Desvenlafaxine Rash   Other Rash    Tide detergent  Tide detergent  Tide detergent    Sulfa Antibiotics Rash     Objective  Vital signs:  Temp:  [97.8 F (36.6 C)-98.3 F (36.8 C)] 97.8 F (36.6 C) (12/06 0820) Pulse Rate:  [75-88] 81 (12/06 0820) Resp:  [16-19] 19 (12/06 0820) BP: (99-128)/(60-77) 112/66 (12/06 0820) SpO2:  [97 %-100 %] 99 % (12/06 0820) Weight:  [58.9 kg] 58.9 kg (12/06 0331)  Mental Status Exam   Appearance and Grooming: Patient is casually dressed and laying on her side in bed. The patient has no noticeable scent or odor. Motor activity: The patient's movement speed was normal; her gait was not observed during encounter. There was no notable abnormal facial movements and no notable abnormal  extremity movements. Behavior: The patient appears in no acute distress, and during the interview, was calm, focused, required minimal redirection, and behaving appropriately to scenario; she was able to follow commands and compliant to requests and made good eye contact. The patient did not appear internally or externally preoccupied. Attitude: Patient's attitude towards the interviewer was cooperative and open. Speech: The patient's speech was clear, fluent, with good articulation, and with appropriately placed inflections. The volume of her speech was   normal and normal in quantity. The rate was normal with a normal rhythm. Responses were normal in latency. There were no abnormal patterns in speech. Mood: "Sad in here by myself" Affect: Patient's affect is blunted with restricted range and even fluctuations; her affect is congruent with her stated mood. ------------------------------------------------------------------------------------------------------------------------- Thought Content The patient experiences no hallucinations. The patient describes no delusional thoughts; she denies thought insertion, denies thought withdrawal, denies thought interruption, and denies thought broadcasting. Patient at the time of interview denies active suicidal intent and denies passive suicidal ideation; she denies homicidal intent. Thought Process The patient's thought process is linear and is goal-directed. Insight The patient at the time of interview demonstrates fair insight. Judgement The patient over the past 24 hours demonstrates fair judgement  Memory: Limited  Executive Functions  Concentration: Intact Attention Span: Fair Recall: Poor Fund of Knowledge: not formally assessed  Alertness/Orientation: alert and oriented to self, place, month, year  Assets  Assets: Armed forces logistics/support/administrative officer; Desire for Improvement; Housing; Catering manager; Social Support; Physical  Health  Physical Exam Constitutional:      Appearance: the patient is not toxic-appearing.  Pulmonary:     Effort: Pulmonary effort is normal.  Neurological:     General: No focal deficit observed.    Mental Status: the patient is alert and oriented to self, place, month, year   Review of Systems  Respiratory:  Negative for shortness of breath.   HEENT: Positive for difficulty hearing Cardiovascular:  Positive for intermittent chest pain.  Gastrointestinal:  Negative for abdominal pain, constipation, diarrhea, nausea and vomiting.  Neurological:  Negative for headaches.   Blood pressure 112/66, pulse 81, temperature 97.8 F (36.6 C), resp. rate 19, height 5' 6" (1.676 m), weight 58.9 kg, last menstrual period 12/24/2021, SpO2 99 %. Body mass index is 20.96 kg/m.

## 2022-01-15 NOTE — Final Progress Note (Signed)
Patient declined to have her morning BG taken. Her previous BG was 134 so a hypoglycemia episode is unlikely. We will hand over to the day team to try do a finger stick when she wakes up.

## 2022-01-16 DIAGNOSIS — I469 Cardiac arrest, cause unspecified: Secondary | ICD-10-CM | POA: Diagnosis not present

## 2022-01-16 LAB — GLUCOSE, CAPILLARY
Glucose-Capillary: 102 mg/dL — ABNORMAL HIGH (ref 70–99)
Glucose-Capillary: 96 mg/dL (ref 70–99)

## 2022-01-16 NOTE — Progress Notes (Signed)
PROGRESS NOTE    [name]  [id number] DOB: [birth year] DOA: 01/05/2022 PCP: Waunita Schooner, MD    Brief Narrative:   Jane Watkins is a 26 y.o. 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  She received CPR by EMTs and ROSC was achieved following 20 minutes and she was brought to the ED for further evaluation.  Patient was initially admitted to the intensive care unit, eventually extubated on 11/27 and transferred to the hospital service on 11/29.  Psychiatry was consulted and is recommending transfer to inpatient psychiatric service now that patient is medically stable.    Patient is currently medically stable for discharge to inpatient psych unit once bed available.  Patient is currently under IVC per psychiatry recommendations.  Assessment & Plan:   Acute hypoxic and toxic metabolic encephalopathy Cardiac arrest, asystole Etiology likely secondary to cardiac arrest following fentanyl and cocaine overdose.  Was initially requiring resuscitative efforts with achieving ROSC after 20 minutes and was initially admitted to the PCCM/ICU service unsuccessfully x-ray on 11/27.  Mental status is much improved during hospitalization, remains slow to respond to questions but answers them appropriately. --Continue supportive care  Polysubstance abuse, current PTSD/depression Suicidal ideation Patient reports suicidal thoughts on the day of admission in which she was admitted following resuscitative efforts after being found down, asystole after overdose on fentanyl and cocaine.  Psychiatry following, currently on IVC.  Psychiatry recommending transfer to inpatient psychiatric service  once bed available. -- Psychiatry following, appreciate assistance -- BuSpar 10 mg p.o. 3 times daily -- Gabapentin 3 mg p.o. nightly -- Seroquel 50 mg p.o. nightly -- Medically stable for transfer once psychiatric bed available -- Continue IVC, will need to be renewed on 12/8 -- Continue one-to-one sitter  Acute hypoxic respiratory failure, POA: Resolved Aspiration pneumonia Etiology secondary to overdose and aspiration event during episode.  Patient completed 5-day course of Unasyn.  Currently on room air.  Severe metabolic acidosis: Resolved  Shock liver Liver enzymes down trending, etiology likely secondary to poor perfusion following cardiopulmonary arrest.  Continue intermittent monitoring of liver functions.  Avoid hepatotoxins.  Hypoglycemic episode: Resolved Etiology likely secondary to cardiac arrest which is now improved, blood sugars have been stable and will now discontinued CBG checks.  Hx seizures Trileptal 150 mg p.o. twice daily, Lamictal 25 mg p.o. twice daily  Hypomagnesemia: Repleted during hospitalization   DVT prophylaxis: enoxaparin (LOVENOX) injection 40 mg Start: 01/06/22 0930 SCDs Start: 01/05/22 1405    Code Status: Full Code Family Communication: No family present at bedside this morning  Disposition Plan:  Level of care: Med-Surg Status is: Inpatient Remains inpatient appropriate because: Medically stable for transfer to inpatient psychiatry once bed available    Consultants:  PCCM Psychiatry  Procedures:  EEG 11/24  Antimicrobials:  Unasyn 11/27 - 12/1   Subjective:  Patient seen examined bedside, resting comfortably.  Lying in bed.  Sitter present.  Complaining of difficulty hearing in both the ears since overdose event.  No other specific complaints or concerns at this time.  Psychiatry continues to follow; with continued recommendation of transfer to inpatient psychiatric unit discussed with social work this morning.  Medically  stable for discharge to inpatient psychiatry once bed available.  Remains on  IVC.  Objective: Vitals:   01/15/22 2021 01/15/22 2114 01/16/22 0506 01/16/22 0813  BP: 115/65 121/66 125/66 (!) 100/55  Pulse: 82 82 90 71  Resp: 19 18 19 16   Temp: (!) 97.5 F (36.4 C) 98.7 F (37.1 C) 98.6 F (37 C) 97.6 F (36.4 C)  TempSrc: Oral Oral Oral Oral  SpO2: 99% 100% 100% 100%  Weight:      Height:       No intake or output data in the 24 hours ending 01/16/22 1110  Filed Weights   01/13/22 0647 01/14/22 0613 01/15/22 0331  Weight: 56.8 kg 58.8 kg 58.9 kg    Examination:  Physical Exam: GEN: NAD, alert and oriented x 3, chronically ill in appearance HEENT: NCAT, PERRL, EOMI, sclera clear, MMM PULM: CTAB w/o wheezes/crackles, normal respiratory effort on room air CV: RRR w/o M/G/R GI: abd soft, NTND, NABS, no R/G/M MSK: no peripheral edema, moves all limbs independently NEURO: No focal deficits PSYCH: Depressed mood, flat affect Integumentary: dry/intact, no rashes or wounds    Data Reviewed: I have personally reviewed following labs and imaging studies  CBC: Recent Labs  Lab 01/10/22 0606  WBC 6.4  HGB 11.5*  HCT 33.9*  MCV 89.0  PLT 0000000   Basic Metabolic Panel: Recent Labs  Lab 01/10/22 0606 01/11/22 0338 01/14/22 1107  NA 144 144 139  K 3.6 3.7 4.1  CL 118* 116* 111  CO2 21* 21* 22  GLUCOSE 113* 99 96  BUN 11 15 18   CREATININE 0.72 0.77 0.78  CALCIUM 8.7* 8.6* 9.0  MG 1.6* 1.9  --    GFR: Estimated Creatinine Clearance: 99.1 mL/min (by C-G formula based on SCr of 0.78 mg/dL). Liver Function Tests: Recent Labs  Lab 01/10/22 0606 01/14/22 1107  AST 24 30  ALT 307* 116*  ALKPHOS 43 65  BILITOT 0.4 0.4  PROT 5.5* 7.0  ALBUMIN 2.9* 3.8   No results for input(s): "LIPASE", "AMYLASE" in the last 168 hours. No results for input(s): "AMMONIA" in the last 168 hours. Coagulation Profile: No results for input(s): "INR", "PROTIME" in the last 168  hours. Cardiac Enzymes: No results for input(s): "CKTOTAL", "CKMB", "CKMBINDEX", "TROPONINI" in the last 168 hours. BNP (last 3 results) No results for input(s): "PROBNP" in the last 8760 hours. HbA1C: No results for input(s): "HGBA1C" in the last 72 hours. CBG: Recent Labs  Lab 01/15/22 1131 01/15/22 1600 01/15/22 2115 01/16/22 0026 01/16/22 0638  GLUCAP 85 100* 82 96 102*   Lipid Profile: No results for input(s): "CHOL", "HDL", "LDLCALC", "TRIG", "CHOLHDL", "LDLDIRECT" in the last 72 hours. Thyroid Function Tests: No results for input(s): "TSH", "T4TOTAL", "FREET4", "T3FREE", "THYROIDAB" in the last 72 hours. Anemia Panel: No results for input(s): "VITAMINB12", "FOLATE", "FERRITIN", "TIBC", "IRON", "RETICCTPCT" in the last 72 hours. Sepsis Labs: No results for input(s): "PROCALCITON", "LATICACIDVEN" in the last 168 hours.  Recent Results (from the past 240 hour(s))  SARS Coronavirus 2 by RT PCR (hospital order, performed in [facility] hospital lab) *cepheid single result test* Anterior Nasal Swab     Status: None   Collection Time: 01/11/22  2:28 PM   Specimen: Anterior Nasal Swab  Result Value Ref Range Status   SARS Coronavirus 2 by RT PCR NEGATIVE NEGATIVE Final    Comment: Performed at [facility], [place] [address] [place], [place] [address]         Radiology Studies: No results found.      Scheduled Meds:  baclofen  10 mg Oral TID   busPIRone  10 mg Oral TID   Chlorhexidine Gluconate Cloth  6 each Topical Daily   docusate sodium  100 mg Oral BID   enoxaparin (LOVENOX) injection  40 mg Subcutaneous Q24H   gabapentin  300 mg Oral QHS   lamoTRIgine  25 mg Oral BID   mirtazapine  15 mg Oral QHS   nicotine  14 mg Transdermal Daily   OXcarbazepine  150 mg Oral BID   polyethylene glycol  17 g Oral Daily   Continuous Infusions:  sodium chloride Stopped (01/07/22 1025)     LOS: 11 days    Time spent: 36 minutes spent on chart review, discussion  with nursing staff, consultants, updating family and interview/physical exam; more than 50% of that time was spent in counseling and/or coordination of care.    Alvira Philips [place], DO [facility] Available via Epic secure chat 7am-7pm After these hours, please refer to coverage provider listed on [email or web address] 01/16/2022, 11:10 AM

## 2022-01-16 NOTE — Progress Notes (Signed)
[facility] Followup Face-to-Face Psychiatric Evaluation  Service Date: January 16, 2022 LOS:  LOS: 11 days   Assessment  Jane Watkins is a 26 y.o. female admitted medically for 01/05/2022 12:28 PM for cardiac arrest. She carries the psychiatric diagnoses of cocaine use disorder, marijuana use disorder, opioid use disorder, benzodiazapine abuse, depression, anxiety and has a past medical history of R MCA stroke in 2021. Psychiatry was consulted for potential self-harm by Dr. Letta Median.   Her current presentation of overdose in the setting of sending concerning text messages to parents ("I never wanted to hurt anybody but myself") is most consistent with suspected suicide attempt. She meets criteria for IVC based on collateral information from parents. Current outpatient psychotropic medications include seroquel 50 QHS, baclofen 75m TID, gabapentin 300 QHS, lamotrigine 50 daily, trileptal 150 BID, and remeron 122mQHS. On initial examination, there was no evidence that patient intentionally took overdose with intention of harming herself or ending her life and her home medications were continued. With collateral obtained, there was increased concern that patient's ingestion was a suicide attempt and patient was IVC'ed. Patient is pending behavioral health inpatient placement at this time. TOC is aware - appreciate assistance.   12/5: On further chart review, patient appears to have been prescribed seroquel for insomnia and was weaned off. Discussed with patient discontinuing seroquel she was prescribed during this hospitalization and she was amenable. Of note, patient reported today that her last thoughts of SI were the day of her overdose. Would continue to recommend inpatient psychiatric hospitalization for her at this time. We are working with SW on placement.   12/6: Patient continues to report good sleep and appetite after discontinuing seroquel yesterday. Affect still flat,  suspect continued difficulties with placement contributing to depressed mood and feelings of isolation during this hospital stay. Continues to report last SI was day of overdose - will continue 1:1 for now. Continuing to work on placement for patient.   12/7: No changes in sleep or appetite. Walked around her room yesterday. Did not do homework as discussed yesterday, gave patient sheet of paper today. Seroquel discontinued given patient report she tapered off prior. Continues to have flat affect re current hospital stay and difficulties with finding placement. Denying SI/HI.   Please see plan below for detailed recommendations.   Diagnoses:  Active Hospital problems: Principal Problem:   [facility] ([facility]:   [place]ysubstance abuse ([facility]  Substance-induced disorder ([facility]   Plan  ## Safety and Observation Level:  - Based on my clinical evaluation, I estimate the patient to be at high risk of self harm in the current setting - At this time, we recommend a 1:1 level of observation. This decision is based on my review of the chart including patient's history and current presentation, interview of the patient, mental status examination, and consideration of suicide risk including evaluating suicidal ideation, plan, intent, suicidal or self-harm behaviors, risk factors, and protective factors. This judgment is based on our ability to directly address suicide risk, implement suicide prevention strategies and develop a safety plan while the patient is in the clinical setting. Please contact our team if there is a concern that risk level has changed.  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  -- PT notes no PT follow-up -- SW continuing to work on disposition, appreciate assistance. She would be an ideal candidate for inpatient substance use rehab if continues to not be accepted anywhere for inpatient psychiatric hospitalization, this was discussed with SW.   ## Behavioral / Environmental:  -- 1:1   ##Legal Status -Involuntary   Thank you for this consult request. Recommendations have been communicated to the primary team.  We will follow at this time.   Rolanda Lundborg, MD, PGY-1   Followup history  Relevant Aspects of Hospital Course:  Admitted on 01/05/2022 for cardiac arrest.  Patient Report:  Mental status exam is notable for the patient being fully alert and oriented with intact attention on testing.   She reports using drugs starting at 26 years old when she began using IV fentanyl.  She reports having an overdose and then a stroke.  She reports stopping fentanyl after that but then started using crack cocaine.  She reports concomitant marijuana use.  She reports that the cocaine use started 9 months ago and that prior she had several months of sobriety.  When asked why she uses drugs, the patient reports "I am bored and I do not feel like if in and I found friends with drugs".  The patient reports she feels like a burden on her family.  He denies experiencing serious depression recently.  She denies ever attempting suicide previously.  She reports occasional passive suicidal thoughts.  She denies experiencing suicidal thoughts recently.   12/1 The patient states that it is good  to meet this provider.  I informed her that we met yesterday and spoke at length.  She does not remember this conversation.  Discussed with her the collateral that her parents provided and told her about the necessity of involuntary commitment.  She expressed understanding.  She asked appropriate questions about the behavioral health hospital which were answered.  She denies experiencing any suicidal or homicidal thoughts.  She denies experiencing auditory or visual hallucinations.  12/4 Patient interviewed with aunt at bedside. Patient reports that she is eating and sleeping well. She is anxious to get out of the hospital and go to inpatient psychiatric hospital. She asks about [facility] treatment center as an inpatient psychiatric facility. She reports feeling "gloomy" being stuck in the medical hospital and feeling alone and isolated since she does not have access to her phone. She recalls leaving the treatment center and the ride home. She reports she does not remember obtaining drugs. She denies her drug overdose was a suicide attempt. Discussed that at this point she is being recommended inpatient psychiatric admission given concerning collateral. She currently denies SI/HI. She denies auditory or visual hallucinations.   12/5 Patient reports she is tired this AM. Reports good sleep. SLP cleared her for regular diet. She reports increase in appetite. She continues to report she "feels stuck in here" and wants to work on herself. Discussed that we are working on inpatient psychiatric hospitalization placement. She denies current SI/HI  but when asked the last time she felt SI, she reports it was the day of her overdose. She reports she feels that her mental health contributes to her addiction and if she was in a better place mentally she wouldn't need to be high. She reports coping skills that she has used other than drugs include therapy, talking to her family, and distracting herself. She reports  remeron helps with her sleep. She reports intermittent chest pain since she received CPR at time of admission.   12/6: Patient continues to report that she is tired this AM. Continues reporting good sleep and appetite. Some residual chest soreness due to getting CPR at time of admission. Also reports some hearing difficulties, noted since admission. Mood is still "sad that I'm in here by myself." Feels like her mood would improve if she left the hospital. Discussed her drug use and she states that the "urge will still be there but I can't do it because my body can't take it." She denies current cravings. She states that to prevent drug use in the future she will "redirect my thoughts and time so that I'm not obsessed about it" and will "find new friends." She denies SI/HI. States that her last SI was day of overdose. She denies AVH. While it is documented that patient declined to have her morning BG taken, sitter at bedside reports that patient did not decline, she was sleeping and staff did not want to awaken her. Patient reports her overdose was in the context of her friend bringing drugs to [facility] treatment center. She would like to go back to [facility] treatment center and then to halfway house afterwards if unable to go to inpatient psychiatric hospital.   12/7: Patient denies any changes in sleep or appetite. Continues to report stable chest pain and trouble with hearing. Her mood is the same, "accept that I'm here and stuck here." Discussed writing down positive affirmations with patient and brought her paper. Encouraged physical activity and moving around her room. Discussed that seroquel had not been previously discontinued and was being discontinued today. She denies SI/HI/AVH. Discussed continuing to work on placement. Dad at bedside and updated on pending admission status.   ROS:  As above   Collateral information:  Aunt present at bedside 12/4. Aunt also asks about her medication  regimen. They both state that she was not taking seroquel before.   Psychiatric History:  Information collected from patient, EMR  Per chart review, patient is prescribed buspar for anxiety and lamictal for history of seizure disorder. Seroquel was being managed by PM&R for insomnia, looks like she was tapering off per 11/6 note. Gabapentin prescribed for spasticity. Lamictal and trileptal looks like are managed by her psychiatrist but also prescribed for h/o seizures. Remeron recently started.    She was seen by Dr. Mallie Darting in 2020, plan at the time was seroquel 100 nightly, 25 daily, and prozac 20 daily.   Family psych history: none  Social History:  As above   Family History:  Family pysch history: none   The patient's family history includes Anemia in her father; Healthy in her sister; Lung cancer in her maternal grandfather and paternal grandfather; Thyroid cancer in her maternal grandmother.  Medical History: Past Medical History:  Diagnosis Date   AKI (acute kidney injury) ([place]) 2018   "from overdose"   Anxiety    Chlamydia    Daily headache    Depression    Drug overdose 04/2016   [name] 05/01/2016  GERD (gastroesophageal reflux disease)    "when I was younger; gone now" (09/21/2017)   IV drug abuse ([place]) 09/20/2017   Migraine    "q couple weeks" (09/21/2017)   Opioid abuse ([place])    Overdose    Stroke [facility])     Surgical History: Past Surgical History:  Procedure Laterality Date   APPENDECTOMY  04/2013   I & D EXTREMITY Left 09/20/2017   Procedure: IRRIGATION AND DEBRIDEMENT LEFT ARM;  Surgeon: [name], MD;  Location: [place];  Service: Orthopedics;  Laterality: Left;   TEE WITHOUT CARDIOVERSION N/A 02/10/2019   Procedure: TRANSESOPHAGEAL ECHOCARDIOGRAM (TEE);  Surgeon: [name], MD;  Location: [facility]S;  Service: Cardiovascular;  Laterality: N/A;  Polysubstance abuser   TONGUE SURGERY  ~ 2007   "related to lisp"   TRACHEOSTOMY  04/2016   [name]  05/01/2016    Medications:   Current Facility-Administered Medications:    0.9 %  sodium chloride infusion, 250 mL, Intravenous, Continuous, Erick Colace, NP, Stopped at 01/07/22 1025   acetaminophen (TYLENOL) tablet 650 mg, 650 mg, Oral, Q6H PRN, 650 mg at 01/11/22 2200 **OR** acetaminophen (TYLENOL) 160 MG/5ML solution 650 mg, 650 mg, Per Tube, Q6H PRN **OR** acetaminophen (TYLENOL) suppository 650 mg, 650 mg, Rectal, Q6H PRN, Virl Axe, MD   baclofen (LIORESAL) tablet 10 mg, 10 mg, Oral, TID, Domenic Polite, MD, 10 mg at 01/15/22 2117   busPIRone (BUSPAR) tablet 10 mg, 10 mg, Oral, TID, Domenic Polite, MD, 10 mg at 01/15/22 2116   Chlorhexidine Gluconate Cloth 2 % PADS 6 each, 6 each, Topical, Daily, Ramaswamy, Murali, MD, 6 each at 01/15/22 1321   docusate sodium (COLACE) capsule 100 mg, 100 mg, Oral, BID, Ventura Sellers, RPH, 100 mg at 01/15/22 2118   enoxaparin (LOVENOX) injection 40 mg, 40 mg, Subcutaneous, Q24H, Mannam, Praveen, MD, 40 mg at 01/13/22 6962   gabapentin (NEURONTIN) capsule 300 mg, 300 mg, Oral, QHS, Ventura Sellers, RPH, 300 mg at 01/15/22 2117   lamoTRIgine (LAMICTAL) tablet 25 mg, 25 mg, Oral, BID, Ventura Sellers, RPH, 25 mg at 01/15/22 2118   menthol-cetylpyridinium (CEPACOL) lozenge 3 mg, 1 lozenge, Oral, PRN, Domenic Polite, MD, 3 mg at 01/08/22 2120   mirtazapine (REMERON SOL-TAB) disintegrating tablet 15 mg, 15 mg, Oral, QHS, Domenic Polite, MD, 15 mg at 01/15/22 2119   nicotine (NICODERM CQ - dosed in mg/24 hours) patch 14 mg, 14 mg, Transdermal, Daily, Domenic Polite, MD, 14 mg at 01/15/22 0959   ondansetron (ZOFRAN) injection 4 mg, 4 mg, Intravenous, Q6H PRN, Erick Colace, NP   OXcarbazepine (TRILEPTAL) tablet 150 mg, 150 mg, Oral, BID, Mannam, Praveen, MD, 150 mg at 01/15/22 2117   polyethylene glycol (MIRALAX / GLYCOLAX) packet 17 g, 17 g, Oral, Daily, Ventura Sellers, RPH, 17 g at 01/10/22 0901  Allergies: Allergies  Allergen  Reactions   Pristiq [Desvenlafaxine Succinate Er] Rash   Desvenlafaxine Rash   Other Rash    Tide detergent  Tide detergent  Tide detergent    Sulfa Antibiotics Rash     Objective  Vital signs:  Temp:  [97.5 F (36.4 C)-98.7 F (37.1 C)] 97.6 F (36.4 C) (12/07 0813) Pulse Rate:  [71-90] 71 (12/07 0813) Resp:  [16-19] 16 (12/07 0813) BP: (100-125)/(55-66) 100/55 (12/07 0813) SpO2:  [99 %-100 %] 100 % (12/07 0813)  Mental Status Exam   Appearance and Grooming: Patient is casually dressed in scrubs and laying on her side in bed. The patient has  no noticeable scent or odor. Motor activity: The patient's movement speed was normal; her gait was not observed during encounter. There was no notable abnormal facial movements and no notable abnormal extremity movements. Behavior: The patient appears in no acute distress, and during the interview, was calm, focused, required minimal redirection, and behaving appropriately to scenario; she was able to follow commands and compliant to requests and made minimal eye contact. The patient did not appear internally or externally preoccupied. Attitude: Patient's attitude towards the interviewer was cooperative and open. Speech: The patient's speech was clear, fluent, with good articulation, and with appropriately placed inflections. The volume of her speech was normal and normal in quantity. The rate was normal with a normal rhythm. Responses were normal in latency. There were no abnormal patterns in speech. Mood: "Accept that I'm here and stuck here" Affect: Patient's affect is flat with restricted range and even fluctuations; her affect is congruent with her stated mood. ------------------------------------------------------------------------------------------------------------------------- Thought Content The patient experiences no hallucinations. The patient describes no delusional thoughts; she denies thought insertion, denies thought  withdrawal, denies thought interruption, and denies thought broadcasting. Patient at the time of interview denies active suicidal intent and denies passive suicidal ideation; she denies homicidal intent. Thought Process The patient's thought process is linear and is goal-directed. Insight The patient at the time of interview demonstrates fair insight. Judgement The patient over the past 24 hours demonstrates fair judgement  Memory: Limited  Executive Functions  Concentration: Intact Attention Span: Fair Recall: Poor Fund of Knowledge: not formally assessed  Alertness/Orientation: alert and oriented to self, place, month, year  Assets  Assets: Armed forces logistics/support/administrative officer; Desire for Improvement; Housing; Catering manager; Social Support; Physical Health  Physical Exam Constitutional:      Appearance: the patient is not toxic-appearing.  Pulmonary:     Effort: Pulmonary effort is normal.  Neurological:     General: No focal deficit observed.    Mental Status: the patient is alert and oriented to self, place, month, year   Review of Systems  Respiratory:  Negative for shortness of breath.   HEENT: Positive for difficulty hearing Cardiovascular:  Positive for chronic, stable, intermittent chest pain.  Gastrointestinal:  Negative for abdominal pain, constipation, diarrhea, nausea and vomiting.  Neurological:  Negative for headaches.   Blood pressure (!) 100/55, pulse 71, temperature 97.6 F (36.4 C), temperature source Oral, resp. rate 16, height _0  (1.676 m), weight 58.9 kg, last menstrual period 12/24/2021, SpO2 100 %. Body mass index is 20.96 kg/m.

## 2022-01-16 NOTE — Progress Notes (Signed)
CSW spoke with [place] at [facility] who states the the [facility] level of care does not have medical staff and it requires the patient to be able to administer their own medications independently.   CSW attempted to reach [facility] admissions several times without success - no voicemail option available.  Edwin Dada, MSW, LCSW Transitions of Care  Clinical Social Worker II [phone]

## 2022-01-17 DIAGNOSIS — I469 Cardiac arrest, cause unspecified: Secondary | ICD-10-CM | POA: Diagnosis not present

## 2022-01-17 LAB — SARS CORONAVIRUS 2 BY RT PCR: SARS Coronavirus 2 by RT PCR: NEGATIVE

## 2022-01-17 NOTE — Progress Notes (Signed)
PROGRESS NOTE    Jane Watkins  JXB:[id number] DOB: 1996-02-08 DOA: 01/05/2022 PCP: Gweneth Dimitri, MD    Brief Narrative:   Jane Watkins is a 26 y.o. 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  She received CPR by EMTs and ROSC was achieved following 20 minutes and she was brought to the ED for further evaluation.  Patient was initially admitted to the intensive care unit, eventually extubated on 11/27 and transferred to the hospital service on 11/29.  Psychiatry was consulted and is recommending transfer to inpatient psychiatric service now that patient is medically stable.    Patient is currently medically stable for discharge to inpatient psych unit once bed available.  Patient is currently under IVC per psychiatry recommendations.  Assessment & Plan:   Acute hypoxic and toxic metabolic encephalopathy Cardiac arrest, asystole Etiology likely secondary to cardiac arrest following fentanyl and cocaine overdose.  Was initially requiring resuscitative efforts with achieving ROSC after 20 minutes and was initially admitted to the PCCM/ICU service unsuccessfully x-ray on 11/27.  Mental status is much improved during hospitalization, remains slow to respond to questions but answers them appropriately. --Continue supportive care  Polysubstance abuse, current PTSD/depression Suicidal ideation Patient reports suicidal thoughts on the day of admission in which she was admitted following resuscitative efforts after being found down, asystole after overdose on fentanyl and cocaine.  Psychiatry following, currently on IVC.  Psychiatry recommending transfer to inpatient psychiatric service  once bed available. -- Psychiatry following, appreciate assistance -- BuSpar 10 mg p.o. 3 times daily -- Gabapentin 3 mg p.o. nightly -- Seroquel 50 mg p.o. nightly -- Medically stable for transfer once psychiatric bed available -- Continue IVC, will need to be renewed on 12/8 by psychiatry -- Continue one-to-one sitter  Acute hypoxic respiratory failure, POA: Resolved Aspiration pneumonia Etiology secondary to overdose and aspiration event during episode.  Patient completed 5-day course of Unasyn.  Currently on room air.  Severe metabolic acidosis: Resolved  Shock liver Liver enzymes down trending, etiology likely secondary to poor perfusion following cardiopulmonary arrest.  Continue intermittent monitoring of liver functions.  Avoid hepatotoxins.  Hypoglycemic episode: Resolved Etiology likely secondary to cardiac arrest which is now improved, blood sugars have been stable and will now discontinued CBG checks.  Hx seizures Trileptal 150 mg p.o. twice daily, Lamictal 25 mg p.o. twice daily  Hypomagnesemia: Repleted during hospitalization   DVT prophylaxis: enoxaparin (LOVENOX) injection 40 mg Start: 01/06/22 0930 SCDs Start: 01/05/22 1405    Code Status: Full Code Family Communication: No family present at bedside this morning  Disposition Plan:  Level of care: Med-Surg Status is: Inpatient Remains inpatient appropriate because: Medically stable for transfer to inpatient psychiatry once bed available    Consultants:  PCCM Psychiatry  Procedures:  EEG 11/24  Antimicrobials:  Unasyn 11/27 - 12/1   Subjective:  Patient seen examined bedside, resting comfortably.  Sleeping, easily arousable.  Sitter and friend present at bedside. No other specific complaints or concerns at this time.  Psychiatry continues to follow; with continued recommendation of transfer to inpatient psychiatric unit discussed with social work this morning.  Medically stable for discharge to  inpatient psychiatry once bed available.  Remains on IVC which will be renewed by psychiatry  today per social work.  Objective: Vitals:   01/16/22 1607 01/16/22 1940 01/17/22 0534 01/17/22 0824  BP: 118/66 104/61 111/61 (!) 102/58  Pulse: 85 92 86 81  Resp: 16 18 18 16   Temp: 98.8 F (37.1 C) 99 F (37.2 C) 98.1 F (36.7 C) 98.1 F (36.7 C)  TempSrc: Oral Oral Oral Oral  SpO2: 100% 100% 100% 100%  Weight:      Height:        Intake/Output Summary (Last 24 hours) at 01/17/2022 1206 Last data filed at 01/16/2022 1830 Gross per 24 hour  Intake 240 ml  Output --  Net 240 ml    Filed Weights   01/13/22 0647 01/14/22 0613 01/15/22 0331  Weight: 56.8 kg 58.8 kg 58.9 kg    Examination:  Physical Exam: GEN: NAD, alert and oriented x 3, chronically ill in appearance HEENT: NCAT, PERRL, EOMI, sclera clear, MMM PULM: CTAB w/o wheezes/crackles, normal respiratory effort on room air CV: RRR w/o M/G/R GI: abd soft, NTND, NABS, no R/G/M MSK: no peripheral edema, moves all limbs independently NEURO: No focal deficits PSYCH: Depressed mood, flat affect Integumentary: dry/intact, no rashes or wounds    Data Reviewed: I have personally reviewed following labs and imaging studies  CBC: No results for input(s): "WBC", "NEUTROABS", "HGB", "HCT", "MCV", "PLT" in the last 168 hours.  Basic Metabolic Panel: Recent Labs  Lab 01/11/22 0338 01/14/22 1107  NA 144 139  K 3.7 4.1  CL 116* 111  CO2 21* 22  GLUCOSE 99 96  BUN 15 18  CREATININE 0.77 0.78  CALCIUM 8.6* 9.0  MG 1.9  --    GFR: Estimated Creatinine Clearance: 99.1 mL/min (by C-G formula based on SCr of 0.78 mg/dL). Liver Function Tests: Recent Labs  Lab 01/14/22 1107  AST 30  ALT 116*  ALKPHOS 65  BILITOT 0.4  PROT 7.0  ALBUMIN 3.8   No results for input(s): "LIPASE", "AMYLASE" in the last 168 hours. No results for input(s): "AMMONIA" in the last 168 hours. Coagulation Profile: No results for input(s):  "INR", "PROTIME" in the last 168 hours. Cardiac Enzymes: No results for input(s): "CKTOTAL", "CKMB", "CKMBINDEX", "TROPONINI" in the last 168 hours. BNP (last 3 results) No results for input(s): "PROBNP" in the last 8760 hours. HbA1C: No results for input(s): "HGBA1C" in the last 72 hours. CBG: Recent Labs  Lab 01/15/22 1131 01/15/22 1600 01/15/22 2115 01/16/22 0026 01/16/22 0638  GLUCAP 85 100* 82 96 102*   Lipid Profile: No results for input(s): "CHOL", "HDL", "LDLCALC", "TRIG", "CHOLHDL", "LDLDIRECT" in the last 72 hours. Thyroid Function Tests: No results for input(s): "TSH", "T4TOTAL", "FREET4", "T3FREE", "THYROIDAB" in the last 72 hours. Anemia Panel: No results for input(s): "VITAMINB12", "FOLATE", "FERRITIN", "TIBC", "IRON", "RETICCTPCT" in the last 72 hours. Sepsis Labs: No results for input(s): "PROCALCITON", "LATICACIDVEN" in the last 168 hours.  Recent Results (from the past 240 hour(s))  SARS Coronavirus 2 by RT PCR (hospital order, performed in [facility] hospital lab) *cepheid single result test* Anterior Nasal Swab     Status: None   Collection Time: 01/11/22  2:28 PM   Specimen: Anterior Nasal Swab  Result Value Ref Range Status   SARS Coronavirus 2 by RT PCR NEGATIVE NEGATIVE Final    Comment: Performed at [facility], [address] [place], [place]         Radiology Studies: No results found.      Scheduled Meds:  baclofen  10 mg Oral  TID   busPIRone  10 mg Oral TID   Chlorhexidine Gluconate Cloth  6 each Topical Daily   docusate sodium  100 mg Oral BID   enoxaparin (LOVENOX) injection  40 mg Subcutaneous Q24H   gabapentin  300 mg Oral QHS   lamoTRIgine  25 mg Oral BID   mirtazapine  15 mg Oral QHS   nicotine  14 mg Transdermal Daily   OXcarbazepine  150 mg Oral BID   polyethylene glycol  17 g Oral Daily   Continuous Infusions:  sodium chloride Stopped (01/07/22 1025)     LOS: 12 days    Time spent: 36 minutes  spent on chart review, discussion with nursing staff, consultants, updating family and interview/physical exam; more than 50% of that time was spent in counseling and/or coordination of care.    Alvira Philips [place], DO [facility] Available via Epic secure chat 7am-7pm After these hours, please refer to coverage provider listed on [email or web address] 01/17/2022, 12:06 PM

## 2022-01-17 NOTE — Progress Notes (Signed)
PT Screen Note  Patient Details Name: Jane Watkins MRN: [id number] DOB: May 31, 1995   Cancelled Treatment:    Reason Eval/Treat Not Completed: PT screened, no needs identified, will sign off  New PT orders received.  Pt was evaluated on 01/09/22 and found to be at baseline.  She is still ambulatory in room/hallway on her own.  Reports she feels even more balance/mobile than she was on 01/09/22.  Screened PT, pt at baseline. No further acute PT needs and confirmed with TOC no further therapy note needed for inpt psych placement.   Refer to 01/09/22 for further detail on mobility.  Anise Salvo, PT Acute Rehab [facility] [phone]  Rayetta Humphrey 01/17/2022, 11:33 AM

## 2022-01-17 NOTE — BH Assessment (Signed)
Patient has been accepted to [facility].  Accepting physician is Dr. Toni Amend.  Attending  Physician will be Dr. Toni Amend.  Patient has pending bed assignment.   Call report to [phone].  Representative/Transfer Coordinator is Arcenia Scarbro. Patient pre-admitted by [facility] Patient Access Sue Lush).

## 2022-01-17 NOTE — Progress Notes (Addendum)
[facility] Followup Face-to-Face Psychiatric Evaluation  Service Date: January 17, 2022 LOS:  LOS: 12 days   Assessment  Jane Watkins is a 26 y.o. female admitted medically for 01/05/2022 12:28 PM for cardiac arrest. She carries the psychiatric diagnoses of cocaine use disorder, marijuana use disorder, opioid use disorder, benzodiazapine abuse, depression, anxiety and has a past medical history of R MCA stroke in 2021. Psychiatry was consulted for potential self-harm by Dr. Letta Median.   Her current presentation of overdose in the setting of sending concerning text messages to parents ("I never wanted to hurt anybody but myself") is most consistent with suspected suicide attempt. She meets criteria for IVC based on collateral information from parents. Current outpatient psychotropic medications include seroquel 50 QHS, baclofen 76m TID, gabapentin 300 QHS, lamotrigine 50 daily, trileptal 150 BID, and remeron 142mQHS. On initial examination, there was no evidence that patient intentionally took overdose with intention of harming herself or ending her life and her home medications were continued. With collateral obtained, there was increased concern that patient's ingestion was a suicide attempt and patient was IVC'ed. Patient is pending behavioral health inpatient placement at this time. TOC is aware - appreciate assistance. On further chart review, patient appears to have been prescribed seroquel for insomnia and was weaned off prior to admission. Discussed with patient discontinuing seroquel she was prescribed during this hospitalization and she was amenable. Of note, patient reported her last thoughts of SI were the day of her overdose. Would continue to recommend inpatient psychiatric hospitalization for her at this time. We are working with SW on placement. Continued flat affect suspected due to difficulties with placement contributing to depressed mood and feelings of isolation during  this hospital stay.   12/8: Patient reports her overdose was a suicide attempt, has some regret over how people would be impacted by it. She also reports passive SI but contracts for safety. Denies AVH. IVC renewed today, continuing to work on placement for patient. Patient reports prior history of seizures were in the setting of substance use, denies seizure history without substance use. PT saw again, noted that she is ambulatory in her room and hallway on her own. Working on inpatient psychiatric placement for patient.   Please see plan below for detailed recommendations.   Diagnoses:  Active Hospital problems: Principal Problem:   [facility]   Polysubstance abuse ([facility]  Substance-induced disorder ([facility]   Plan  ## Safety and Observation Level:  - Based on my clinical evaluation, I estimate the patient to be at high risk of self harm in the current setting - At this time, we recommend a 1:1 level of observation. This decision is based on my review of the chart including patient's history and current presentation, interview of the patient, mental status examination, and consideration of suicide risk including evaluating suicidal ideation, plan, intent, suicidal or self-harm behaviors, risk factors, and protective factors. This judgment is based on our ability to directly address suicide risk, implement suicide prevention strategies and develop a safety plan while the patient is in the clinical setting. Please contact our team if there is a concern that risk level has changed.  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  -- PT notes no PT follow-up -- SW continuing to work on disposition, appreciate assistance. She would be an ideal candidate for inpatient substance use rehab if continues to not be accepted anywhere for inpatient psychiatric hospitalization, this was discussed with SW.   ## Behavioral / Environmental:  -- 1:1   ##Legal Status -Involuntary   Thank you for this consult request. Recommendations have been communicated to the primary team.  We will follow at this time.   Rolanda Lundborg, MD, PGY-1   Followup history  Relevant Aspects of Hospital Course:  Admitted on 01/05/2022 for cardiac arrest.  Patient Report:  Mental status exam is notable for the patient being fully alert and oriented with intact attention on testing.   She reports using drugs starting at 26 years old when she began using IV fentanyl.  She reports having an overdose and then a stroke.  She reports stopping fentanyl after that but then started using crack cocaine.  She reports concomitant marijuana use.  She reports that the cocaine use started 9 months ago and that prior she had several months of sobriety.  When asked why she uses drugs, the patient reports "I am bored and I do not feel like if in and I found friends with drugs".  The patient reports she feels like a burden on her family.  He denies experiencing serious depression recently. She denies ever attempting suicide previously.  She reports occasional passive suicidal thoughts. She denies experiencing suicidal thoughts recently.   12/1 The patient states that it is good to meet this provider.  I informed her that we met yesterday and spoke  at length.  She does not remember this conversation.  Discussed with her the collateral that her parents provided and told her about the necessity of involuntary commitment.  She expressed understanding.  She asked appropriate questions about the behavioral health hospital which were answered.  She denies experiencing any suicidal or homicidal thoughts.  She denies experiencing auditory or visual hallucinations.  12/4 Patient interviewed with aunt at bedside. Patient reports that she is eating and sleeping well. She is anxious to get out of the hospital and go to inpatient psychiatric hospital. She asks about [facility] treatment center as an inpatient psychiatric facility. She reports feeling "gloomy" being stuck in the medical hospital and feeling alone and isolated since she does not have access to her phone. She recalls leaving the treatment center and the ride home. She reports she does not remember obtaining drugs. She denies her drug overdose was a suicide attempt. Discussed that at this point she is being recommended inpatient psychiatric admission given concerning collateral. She currently denies SI/HI. She denies auditory or visual hallucinations.   12/5 Patient reports she is tired this AM. Reports good sleep. SLP cleared her for regular diet. She reports increase in appetite. She continues to report she "feels stuck in here" and wants to work on herself. Discussed that we are working on inpatient psychiatric hospitalization placement. She denies current SI/HI but when asked the last time she felt SI,  she reports it was the day of her overdose. She reports she feels that her mental health contributes to her addiction and if she was in a better place mentally she wouldn't need to be high. She reports coping skills that she has used other than drugs include therapy, talking to her family, and distracting herself. She reports remeron helps with her sleep. She reports intermittent chest pain since she  received CPR at time of admission.   12/6: Patient continues to report that she is tired this AM. Continues reporting good sleep and appetite. Some residual chest soreness due to getting CPR at time of admission. Also reports some hearing difficulties, noted since admission. Mood is still "sad that I'm in here by myself." Feels like her mood would improve if she left the hospital. Discussed her drug use and she states that the "urge will still be there but I can't do it because my body can't take it." She denies current cravings. She states that to prevent drug use in the future she will "redirect my thoughts and time so that I'm not obsessed about it" and will "find new friends." She denies SI/HI. States that her last SI was day of overdose. She denies AVH. While it is documented that patient declined to have her morning BG taken, sitter at bedside reports that patient did not decline, she was sleeping and staff did not want to awaken her. Patient reports her overdose was in the context of her friend bringing drugs to [facility] treatment center. She would like to go back to [facility] treatment center and then to halfway house afterwards if unable to go to inpatient psychiatric hospital.   12/7: Patient denies any changes in sleep or appetite. Continues to report stable chest pain and trouble with hearing. Her mood is the same, "accept that I'm here and stuck here." Discussed writing down positive affirmations with patient and brought her paper. Encouraged physical activity and moving around her room. Discussed that seroquel had not been previously discontinued and was being discontinued today. She denies SI/HI/AVH. Discussed continuing to work on placement. Dad at bedside and updated on pending admission status.   12/8: Friend sleeping at bedside. Patient reports some difficulty falling asleep last night without the seroquel but she was able to fall asleep and stay asleep. No changes in appetite. Reports  her visit with her dad went fine. Reports her mood continues to be "so-so," still feels like she is stuck here and she is doing the same thing every day. She denies SI/HI currently but does state the overdose was a suicide attempt and that she "sometimes thinks people would be better off without me or I would be better off dead." She regrets the attempt when she thinks about how people like her family would feel. Continues to report passive SI but contracts for safety. She denies AVH, thought insertion, withdrawal, or broadcasting.   ROS:  As above   Collateral information:  Aunt present at bedside 12/4. Aunt also asks about her medication regimen. They both state that she was not taking seroquel before.   Psychiatric History:  Information collected from patient, EMR  Per chart review, patient is prescribed buspar for anxiety and lamictal for history of seizure disorder. Seroquel was being managed by PM&R for insomnia, looks like she was tapering off per 11/6 note. Gabapentin prescribed for spasticity. Lamictal and trileptal looks like are managed by her psychiatrist but also prescribed for h/o seizures. Remeron recently started.    She was  seen by Dr. Mallie Darting in 2020, plan at the time was seroquel 100 nightly, 25 daily, and prozac 20 daily.   Family psych history: none  Social History:  As above   Family History:  Family pysch history: none   The patient's family history includes Anemia in her father; Healthy in her sister; Lung cancer in her maternal grandfather and paternal grandfather; Thyroid cancer in her maternal grandmother.  Medical History: Past Medical History:  Diagnosis Date   AKI (acute kidney injury) ([place]) 2018   "from overdose"   Anxiety    Chlamydia    Daily headache    Depression    Drug overdose 04/2016   [name] 05/01/2016   GERD (gastroesophageal reflux disease)    "when I was younger; gone now" (09/21/2017)   IV drug abuse ([place]) 09/20/2017   Migraine    "q  couple weeks" (09/21/2017)   Opioid abuse ([place])    Overdose    Stroke [facility])     Surgical History: Past Surgical History:  Procedure Laterality Date   APPENDECTOMY  04/2013   I & D EXTREMITY Left 09/20/2017   Procedure: IRRIGATION AND DEBRIDEMENT LEFT ARM;  Surgeon: [name], MD;  Location: [place];  Service: Orthopedics;  Laterality: Left;   TEE WITHOUT CARDIOVERSION N/A 02/10/2019   Procedure: TRANSESOPHAGEAL ECHOCARDIOGRAM (TEE);  Surgeon: [name], MD;  Location: [facility] ORS;  Service: Cardiovascular;  Laterality: N/A;  Polysubstance abuser   TONGUE SURGERY  ~ 2007   "related to lisp"   TRACHEOSTOMY  04/2016   [name] 05/01/2016    Medications:   Current Facility-Administered Medications:    0.9 %  sodium chloride infusion, 250 mL, Intravenous, Continuous, Erick Colace, NP, Stopped at 01/07/22 1025   acetaminophen (TYLENOL) tablet 650 mg, 650 mg, Oral, Q6H PRN, 650 mg at 01/11/22 2200 **OR** acetaminophen (TYLENOL) 160 MG/5ML solution 650 mg, 650 mg, Per Tube, Q6H PRN **OR** acetaminophen (TYLENOL) suppository 650 mg, 650 mg, Rectal, Q6H PRN, Virl Axe, MD   baclofen (LIORESAL) tablet 10 mg, 10 mg, Oral, TID, Domenic Polite, MD, 10 mg at 01/16/22 2053   busPIRone (BUSPAR) tablet 10 mg, 10 mg, Oral, TID, Domenic Polite, MD, 10 mg at 01/16/22 2054   Chlorhexidine Gluconate Cloth 2 % PADS 6 each, 6 each, Topical, Daily, Ramaswamy, Murali, MD, 6 each at 01/16/22 1433   docusate sodium (COLACE) capsule 100 mg, 100 mg, Oral, BID, Ventura Sellers, RPH, 100 mg at 01/16/22 2053   enoxaparin (LOVENOX) injection 40 mg, 40 mg, Subcutaneous, Q24H, Mannam, Praveen, MD, 40 mg at 01/13/22 3329   gabapentin (NEURONTIN) capsule 300 mg, 300 mg, Oral, QHS, Ventura Sellers, RPH, 300 mg at 01/16/22 2053   lamoTRIgine (LAMICTAL) tablet 25 mg, 25 mg, Oral, BID, Ventura Sellers, RPH, 25 mg at 01/16/22 2055   menthol-cetylpyridinium (CEPACOL) lozenge 3 mg, 1 lozenge, Oral, PRN,  Domenic Polite, MD, 3 mg at 01/08/22 2120   mirtazapine (REMERON SOL-TAB) disintegrating tablet 15 mg, 15 mg, Oral, QHS, Domenic Polite, MD, 15 mg at 01/16/22 2054   nicotine (NICODERM CQ - dosed in mg/24 hours) patch 14 mg, 14 mg, Transdermal, Daily, Domenic Polite, MD, 14 mg at 01/16/22 1012   ondansetron (ZOFRAN) injection 4 mg, 4 mg, Intravenous, Q6H PRN, Erick Colace, NP   OXcarbazepine (TRILEPTAL) tablet 150 mg, 150 mg, Oral, BID, Mannam, Praveen, MD, 150 mg at 01/16/22 2107   polyethylene glycol (MIRALAX / GLYCOLAX) packet 17 g, 17 g, Oral, Daily, Ventura Sellers,  RPH, 17 g at 01/10/22 0901  Allergies: Allergies  Allergen Reactions   Pristiq [Desvenlafaxine Succinate Er] Rash   Desvenlafaxine Rash   Other Rash    Tide detergent  Tide detergent  Tide detergent    Sulfa Antibiotics Rash     Objective  Vital signs:  Temp:  [98.1 F (36.7 C)-99 F (37.2 C)] 98.1 F (36.7 C) (12/08 0534) Pulse Rate:  [85-92] 86 (12/08 0534) Resp:  [16-18] 18 (12/08 0534) BP: (104-118)/(61-66) 111/61 (12/08 0534) SpO2:  [100 %] 100 % (12/08 0534)  Mental Status Exam   Appearance and Grooming: Patient is casually dressed in scrubs and sitting up on her bed. The patient has no noticeable scent or odor. Motor activity: The patient's movement speed was normal; her gait was  slightly leaning towards right . There was no notable abnormal facial movements and no notable abnormal extremity movements. Behavior: The patient appears in no acute distress, and during the interview, was calm, focused, required minimal redirection, and behaving appropriately to scenario; she was able to follow commands and compliant to requests and made minimal eye contact. The patient did not appear internally or externally preoccupied. Attitude: Patient's attitude towards the interviewer was cooperative and open. Speech: The patient's speech was clear, fluent, with good articulation, and with appropriately  placed inflections. The volume of her speech was normal and normal in quantity. The rate was normal with a normal rhythm. Responses were normal in latency. There were no abnormal patterns in speech. Mood: "Accept that I'm here and stuck here" Affect: Patient's affect is flat with restricted range and even fluctuations; her affect is congruent with her stated mood. ------------------------------------------------------------------------------------------------------------------------- Thought Content The patient experiences no hallucinations. The patient describes no delusional thoughts; she denies thought insertion, denies thought withdrawal, denies thought interruption, and denies thought broadcasting. Patient at the time of interview endorses passive suicidal ideation, stating that she sometimes think she would be better off dead and denies active suicidal intent and contracts for safety; she denies homicidal intent. Thought Process The patient's thought process is linear and is goal-directed. Insight The patient at the time of interview demonstrates fair insight. Judgement The patient over the past 24 hours demonstrates fair judgement  Memory: Limited  Executive Functions  Concentration: Intact Attention Span: Fair Recall: Poor Fund of Knowledge: not formally assessed  Alertness/Orientation: alert and oriented to self, place, month, year  Assets  Assets: Armed forces logistics/support/administrative officer; Desire for Improvement; Housing; Catering manager; Social Support; Physical Health  Physical Exam Constitutional:      Appearance: the patient is not toxic-appearing.  Pulmonary:     Effort: Pulmonary effort is normal.  Neurological:     General: No focal deficit observed.    Mental Status: the patient is alert and oriented to self, place, month, year   Review of Systems  Respiratory:  Negative for shortness of breath.   Cardiovascular:  Positive for chronic, stable, intermittent chest  pain.  Gastrointestinal:  Negative for abdominal pain, constipation, diarrhea, nausea and vomiting.  Neurological:  Negative for headaches.   Blood pressure 111/61, pulse 86, temperature 98.1 F (36.7 C), temperature source Oral, resp. rate 18, height _0  (1.676 m), weight 58.9 kg, last menstrual period 12/24/2021, SpO2 100 %. Body mass index is 20.96 kg/m.

## 2022-01-17 NOTE — Plan of Care (Signed)
  Problem: Education: Goal: Ability to manage disease process will improve Outcome: Progressing   Problem: Cardiac: Goal: Ability to achieve and maintain adequate cardiopulmonary perfusion will improve Outcome: Progressing   Problem: Neurologic: Goal: Promote progressive neurologic recovery Outcome: Progressing   Problem: Skin Integrity: Goal: Risk for impaired skin integrity will be minimized. Outcome: Progressing   Problem: Education: Goal: Knowledge of General Education information will improve Description: Including pain rating scale, medication(s)/side effects and non-pharmacologic comfort measures Outcome: Progressing   Problem: Health Behavior/Discharge Planning: Goal: Ability to manage health-related needs will improve Outcome: Progressing   Problem: Clinical Measurements: Goal: Ability to maintain clinical measurements within normal limits will improve Outcome: Progressing Goal: Will remain free from infection Outcome: Progressing Goal: Diagnostic test results will improve Outcome: Progressing Goal: Respiratory complications will improve Outcome: Progressing Goal: Cardiovascular complication will be avoided Outcome: Progressing   Problem: Activity: Goal: Risk for activity intolerance will decrease Outcome: Progressing   Problem: Nutrition: Goal: Adequate nutrition will be maintained Outcome: Progressing   Problem: Coping: Goal: Level of anxiety will decrease Outcome: Progressing   Problem: Elimination: Goal: Will not experience complications related to bowel motility Outcome: Progressing Goal: Will not experience complications related to urinary retention Outcome: Progressing   Problem: Pain Managment: Goal: General experience of comfort will improve Outcome: Progressing   Problem: Safety: Goal: Ability to remain free from injury will improve Outcome: Progressing   Problem: Skin Integrity: Goal: Risk for impaired skin integrity will  decrease Outcome: Progressing   Problem: Safety: Goal: Non-violent Restraint(s) Outcome: Progressing   

## 2022-01-18 DIAGNOSIS — I469 Cardiac arrest, cause unspecified: Secondary | ICD-10-CM | POA: Diagnosis not present

## 2022-01-18 DIAGNOSIS — F19182 Other psychoactive substance abuse with psychoactive substance-induced sleep disorder: Secondary | ICD-10-CM

## 2022-01-18 NOTE — Plan of Care (Signed)
  Problem: Education: Goal: Ability to manage disease process will improve Outcome: Progressing   Problem: Cardiac: Goal: Ability to achieve and maintain adequate cardiopulmonary perfusion will improve Outcome: Progressing   Problem: Neurologic: Goal: Promote progressive neurologic recovery Outcome: Progressing   Problem: Skin Integrity: Goal: Risk for impaired skin integrity will be minimized. Outcome: Progressing   Problem: Education: Goal: Knowledge of General Education information will improve Description: Including pain rating scale, medication(s)/side effects and non-pharmacologic comfort measures Outcome: Progressing   Problem: Health Behavior/Discharge Planning: Goal: Ability to manage health-related needs will improve Outcome: Progressing   Problem: Clinical Measurements: Goal: Ability to maintain clinical measurements within normal limits will improve Outcome: Progressing Goal: Will remain free from infection Outcome: Progressing Goal: Diagnostic test results will improve Outcome: Progressing Goal: Respiratory complications will improve Outcome: Progressing Goal: Cardiovascular complication will be avoided Outcome: Progressing   Problem: Activity: Goal: Risk for activity intolerance will decrease Outcome: Progressing   Problem: Nutrition: Goal: Adequate nutrition will be maintained Outcome: Progressing   Problem: Coping: Goal: Level of anxiety will decrease Outcome: Progressing   Problem: Elimination: Goal: Will not experience complications related to bowel motility Outcome: Progressing Goal: Will not experience complications related to urinary retention Outcome: Progressing   Problem: Pain Managment: Goal: General experience of comfort will improve Outcome: Progressing   Problem: Safety: Goal: Ability to remain free from injury will improve Outcome: Progressing   Problem: Skin Integrity: Goal: Risk for impaired skin integrity will  decrease Outcome: Progressing   Problem: Safety: Goal: Non-violent Restraint(s) Outcome: Progressing   

## 2022-01-18 NOTE — Progress Notes (Signed)
PROGRESS NOTE    Jane Watkins  [id number] DOB: 11/09/1995 DOA: 01/05/2022 PCP: Waunita Schooner, MD    Brief Narrative:   Jane Watkins is a 26 y.o. 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  She received CPR by EMTs and ROSC was achieved following 20 minutes and she was brought to the ED for further evaluation.  Patient was initially admitted to the intensive care unit, eventually extubated on 11/27 and transferred to the hospital service on 11/29.  Psychiatry was consulted and is recommending transfer to inpatient psychiatric service now that patient is medically stable.    Patient is currently medically stable for discharge to inpatient psych unit once bed available.  Patient is currently under IVC per psychiatry recommendations.  Assessment & Plan:   Acute hypoxic and toxic metabolic encephalopathy Cardiac arrest, asystole Etiology likely secondary to cardiac arrest following fentanyl and cocaine overdose.  Was initially requiring resuscitative efforts with achieving ROSC after 20 minutes and was initially admitted to the PCCM/ICU service unsuccessfully x-ray on 11/27.  Mental status is much improved during hospitalization, remains slow to respond to questions but answers them appropriately. --Continue supportive care  Polysubstance abuse, current PTSD/depression Suicidal ideation Patient reports suicidal thoughts on the day of admission in which she was admitted following resuscitative efforts after being found down, asystole after overdose on fentanyl and cocaine.  Psychiatry following, currently on IVC.  Psychiatry recommending transfer to inpatient psychiatric service  once bed available. -- Psychiatry following, appreciate assistance -- BuSpar 10 mg p.o. 3 times daily -- Gabapentin 3 mg p.o. nightly -- Seroquel 50 mg p.o. nightly -- Medically stable for transfer once psychiatric bed available -- Continue IVC, renewed on 12/8 by psychiatry -- Continue one-to-one sitter  Acute hypoxic respiratory failure, POA: Resolved Aspiration pneumonia Etiology secondary to overdose and aspiration event during episode.  Patient completed 5-day course of Unasyn.  Currently on room air.  Severe metabolic acidosis: Resolved  Shock liver Liver enzymes down trending, etiology likely secondary to poor perfusion following cardiopulmonary arrest.  Continue intermittent monitoring of liver functions.  Avoid hepatotoxins.  Hypoglycemic episode: Resolved Etiology likely secondary to cardiac arrest which is now improved, blood sugars have been stable and will now discontinued CBG checks.  Hx seizures Trileptal 150 mg p.o. twice daily, Lamictal 25 mg p.o. twice daily  Hypomagnesemia: Repleted during hospitalization   DVT prophylaxis: SCDs Start: 01/05/22 1405    Code Status: Full Code Family Communication: Friend present at bedside  Disposition Plan:  Level of care: Med-Surg Status is: Inpatient Remains inpatient appropriate because: Medically stable for transfer to inpatient psychiatry once bed available; accepted to [facility] inpatient psych    Consultants:  PCCM Psychiatry  Procedures:  EEG 11/24  Antimicrobials:  Unasyn 11/27 - 12/1   Subjective:  Patient seen examined bedside, resting comfortably.  Sleeping, easily arousable.  Sitter and friend present at bedside. No other specific complaints or concerns at this time.  Psychiatry continues to follow; with continued recommendation of transfer to inpatient psychiatric unit; has been accepted at [facility] inpatient psych awaiting bed availability.  Medically stable for discharge to inpatient psychiatry once bed  available.  Remains on IVC which was renewed by psychiatry yesterday.  Objective: Vitals:   01/17/22  1635 01/17/22 2011 01/18/22 0500 01/18/22 0737  BP: 118/69 112/75 102/70 119/61  Pulse: 86 82 73 81  Resp: 16 18 16 18   Temp: 98.4 F (36.9 C) 98.7 F (37.1 C) 98.7 F (37.1 C) 98.2 F (36.8 C)  TempSrc: Oral Oral Oral Oral  SpO2: 100% 99% 97% 99%  Weight:   58.9 kg   Height:        Intake/Output Summary (Last 24 hours) at 01/18/2022 0941 Last data filed at 01/17/2022 2157 Gross per 24 hour  Intake 840 ml  Output --  Net 840 ml    Filed Weights   01/14/22 0613 01/15/22 0331 01/18/22 0500  Weight: 58.8 kg 58.9 kg 58.9 kg    Examination:  Physical Exam: GEN: NAD, alert and oriented x 3, chronically ill in appearance HEENT: NCAT, PERRL, EOMI, sclera clear, MMM PULM: CTAB w/o wheezes/crackles, normal respiratory effort on room air CV: RRR w/o M/G/R GI: abd soft, NTND, NABS, no R/G/M MSK: no peripheral edema, moves all limbs independently NEURO: No focal deficits PSYCH: Depressed mood, flat affect Integumentary: dry/intact, no rashes or wounds    Data Reviewed: I have personally reviewed following labs and imaging studies  CBC: No results for input(s): "WBC", "NEUTROABS", "HGB", "HCT", "MCV", "PLT" in the last 168 hours.  Basic Metabolic Panel: Recent Labs  Lab 01/14/22 1107  NA 139  K 4.1  CL 111  CO2 22  GLUCOSE 96  BUN 18  CREATININE 0.78  CALCIUM 9.0   GFR: Estimated Creatinine Clearance: 99.1 mL/min (by C-G formula based on SCr of 0.78 mg/dL). Liver Function Tests: Recent Labs  Lab 01/14/22 1107  AST 30  ALT 116*  ALKPHOS 65  BILITOT 0.4  PROT 7.0  ALBUMIN 3.8   No results for input(s): "LIPASE", "AMYLASE" in the last 168 hours. No results for input(s): "AMMONIA" in the last 168 hours. Coagulation Profile: No results for input(s): "INR", "PROTIME" in the last 168 hours. Cardiac Enzymes: No results for input(s): "CKTOTAL", "CKMB",  "CKMBINDEX", "TROPONINI" in the last 168 hours. BNP (last 3 results) No results for input(s): "PROBNP" in the last 8760 hours. HbA1C: No results for input(s): "HGBA1C" in the last 72 hours. CBG: Recent Labs  Lab 01/15/22 1131 01/15/22 1600 01/15/22 2115 01/16/22 0026 01/16/22 0638  GLUCAP 85 100* 82 96 102*   Lipid Profile: No results for input(s): "CHOL", "HDL", "LDLCALC", "TRIG", "CHOLHDL", "LDLDIRECT" in the last 72 hours. Thyroid Function Tests: No results for input(s): "TSH", "T4TOTAL", "FREET4", "T3FREE", "THYROIDAB" in the last 72 hours. Anemia Panel: No results for input(s): "VITAMINB12", "FOLATE", "FERRITIN", "TIBC", "IRON", "RETICCTPCT" in the last 72 hours. Sepsis Labs: No results for input(s): "PROCALCITON", "LATICACIDVEN" in the last 168 hours.  Recent Results (from the past 240 hour(s))  SARS Coronavirus 2 by RT PCR (hospital order, performed in [facility] hospital lab) *cepheid single result test* Anterior Nasal Swab     Status: None   Collection Time: 01/11/22  2:28 PM   Specimen: Anterior Nasal Swab  Result Value Ref Range Status   SARS Coronavirus 2 by RT PCR NEGATIVE NEGATIVE Final    Comment: Performed at [facility], [place] [address], [place],  [address]  SARS Coronavirus 2 by RT PCR (hospital order, performed in [facility] hospital lab) *cepheid single result test* Anterior Nasal Swab     Status: None   Collection Time: 01/17/22  9:45 PM   Specimen: Anterior Nasal Swab  Result Value Ref Range Status   SARS Coronavirus  2 by RT PCR NEGATIVE NEGATIVE Final    Comment: (NOTE) SARS-CoV-2 target nucleic acids are NOT DETECTED.  The SARS-CoV-2 RNA is generally detectable in upper and lower respiratory specimens during the acute phase of infection. The lowest concentration of SARS-CoV-2 viral copies this assay can detect is 250 copies / mL. A negative result does not preclude SARS-CoV-2 infection and should not be used as the sole basis for  treatment or other patient management decisions.  A negative result may occur with improper specimen collection / handling, submission of specimen other than nasopharyngeal swab, presence of viral mutation(s) within the areas targeted by this assay, and inadequate number of viral copies (<250 copies / mL). A negative result must be combined with clinical observations, patient history, and epidemiological information.  Fact Sheet for Patients:   RoadLapTop.co.za  Fact Sheet for Healthcare Providers: http://kim-miller.com/  This test is not yet approved or  cleared by the [place] FDA and has been authorized for detection and/or diagnosis of SARS-CoV-2 by FDA under an Emergency Use Authorization (EUA).  This EUA will remain in effect (meaning this test can be used) for the duration of the COVID-19 declaration under Section 564(b)(1) of the Act, 21 U.S.C. section 360bbb-3(b)(1), unless the authorization is terminated or revoked sooner.  Performed at [facility], [address] [place], [place] [address]          Radiology Studies: No results found.      Scheduled Meds:  baclofen  10 mg Oral TID   busPIRone  10 mg Oral TID   Chlorhexidine Gluconate Cloth  6 each Topical Daily   docusate sodium  100 mg Oral BID   gabapentin  300 mg Oral QHS   lamoTRIgine  25 mg Oral BID   mirtazapine  15 mg Oral QHS   nicotine  14 mg Transdermal Daily   OXcarbazepine  150 mg Oral BID   polyethylene glycol  17 g Oral Daily   Continuous Infusions:  sodium chloride Stopped (01/07/22 1025)     LOS: 13 days    Time spent: 36 minutes spent on chart review, discussion with nursing staff, consultants, updating family and interview/physical exam; more than 50% of that time was spent in counseling and/or coordination of care.    Alvira Philips [place], DO [facility] Available via Epic secure chat 7am-7pm After these hours,  please refer to coverage provider listed on [email or web address] 01/18/2022, 9:41 AM

## 2022-01-18 NOTE — Consult Note (Signed)
[facility] New Face-to-Face Psychiatric Evaluation   Service Date: January 18, 2022 LOS:  LOS: 13 days    Assessment  Jane Watkins is a 26 year old female with a past psychiatric history of polysubstance use and IV drug use.  She has a past medical history that is notable for multiple overdoses resulting in acute inpatient rehab admissions.  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  She has no previous psychiatric admissions.  She was medically admitted on 11/26 for cardiac arrest.  According to previous notes, a bystander called EMS after she was unresponsive and they reported an overdose on fentanyl and crack cocaine.  CPR was performed for 20 minutes before ROSC was obtained.  Since that time the patient's mental status has gradually improved.  Psychiatry was consulted for potential self-harm by Dr. Letta Median.  12/1 Collected collateral from the patient's father and mother, numbers listed in the chart.  The patient has been residing with her father for the past 1.5 years.  He reports that the patient made a statement about a possible suicide attempt 2 weeks ago.  He is very concerned about the patient's mental wellbeing and feels that he cannot keep her safe at home.  Both the patient's father and her mother state that they are very concerned about the patient eloping.  The patient's mother reports that the patient sent her a text message saying "I never wanted to hurt anybody but myself" the day of the ingestion that led to her cardiac arrest.  She states that the patient told many family members recently that she does not want to be alive.  She says this is a new development.  She feels that the patient's ingestion was a suicide attempt.  She feels the patient cannot be Safe at her home or the patient's father's home.  Given this information, involuntary commitment paperwork is being filled out and faxed to the magistrate.  The patient was informed  of the involuntary commitment.  She asked appropriate questions about the [place] [facility] hospital which this provider answered.  Should not be allowed to leave the hospital.  Will seek behavioral health inpatient placement when medically ready.  01/18/22 : Patient seen face to face in her hospital room this morning. She is sceptical about inpatient admission to [facility] due to previous experience at the hospital. As a result, she gave me permission to talk to her mother-Ms Arbie Cookey. Collateral information obtained from patient's mother confirmed that patient had a bad experience with her ex-boyfriend during an admission at [facility] in the past. However, mother and patient concern were acknowledged but they were informed every hospital do their best to keep the patient's safe. Also, her current condition still warrant inpatient psychiatric admission for stabilization. Patient confirmed that her overdose was a suicide attempt, has some regret over how people would be impacted by it and still unable to contract for safety. Today, she denies delusions, psychosis and homicidal thoughts. Social worker will continue to work on inpatient psychiatric placement for patient.    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 This decision  is based on my review of the chart including patient's history and current presentation, interview of the patient, mental status examination, and consideration of suicide risk including evaluating suicidal ideation, plan, intent, suicidal or self-harm behaviors, risk factors, and protective factors. This judgment is based on our ability to directly address  suicide risk, implement suicide prevention strategies and develop a safety plan while the patient is in the clinical setting. Please contact our team if there is a concern that risk level has changed.   ## Medications:  -Continue home baclofen as prescribed - Continue home gabapentin as prescribed - Continue home lamotrigine as prescribed - Continue home Trileptal as prescribed - Continue Remeron 15 mg nightly, new medication? - Continue home Seroquel as prescribed   ## Medical Decision Making Capacity:  Not assessed on this encounter  ## Further Work-up:  Per primary  ## Disposition:  TBD  ## Behavioral / Environmental:  -- high risk of self-harm, 1:1 needed  ##Legal Status INVOLUNTARY  Thank you for this consult request. Recommendations have been communicated to the primary team.  We will follow at this time.   Corena Pilgrim, MD   NEW history  Relevant Aspects of Hospital Course:  Admitted on 01/05/2022 for Cardiac arrest.  Patient Report:  Mental status exam is notable for the patient being fully alert and oriented with intact attention on testing.  She reports using drugs starting at 26 years old when she began using IV fentanyl.  She reports having an overdose and then a stroke.  She reports stopping fentanyl after that but then started using crack cocaine.  She reports concomitant marijuana use.  She reports that the cocaine use started 9 months ago and that prior she had several months of sobriety.  When asked why she uses drugs, the patient reports "I am bored and I do not feel like if in and I found friends with drugs".  The patient reports she feels like a burden on her family.  He denies experiencing serious depression recently.  She denies ever attempting suicide previously.  She reports occasional passive suicidal thoughts.  She denies experiencing suicidal thoughts recently.  12/1 The patient states that it is good to meet this provider.  I informed her that  we met yesterday and spoke at length.  She does not remember this conversation.  Discussed with her the collateral that her parents provided and told her about the necessity of involuntary commitment.  She expressed understanding.  She asked appropriate questions about the behavioral health hospital which were answered.  She denies experiencing any suicidal or homicidal thoughts.  She denies experiencing auditory or visual hallucinations.  ROS:  As above  Collateral information:  none  Psychiatric History:  Information collected from patient, EMR  Family psych history: none   Social History:  As above   Family History:  The patient's family history includes Anemia in her father; Healthy in her sister; Lung cancer in her maternal grandfather and paternal grandfather; Thyroid cancer in her maternal grandmother.  Medical History: Past Medical History:  Diagnosis Date   AKI (acute kidney injury) ([place]) 2018   "from overdose"   Anxiety    Chlamydia    Daily headache    Depression    Drug overdose 04/2016   [name] 05/01/2016   GERD (gastroesophageal reflux disease)    "when I was younger; gone now" (09/21/2017)   IV drug abuse ([place]) 09/20/2017   Migraine    "q couple weeks" (09/21/2017)   Opioid  abuse ([place])    Overdose    Stroke [facility])     Surgical History: Past Surgical History:  Procedure Laterality Date   APPENDECTOMY  04/2013   I & D EXTREMITY Left 09/20/2017   Procedure: IRRIGATION AND DEBRIDEMENT LEFT ARM;  Surgeon: [name], MD;  Location: [place];  Service: Orthopedics;  Laterality: Left;   TEE WITHOUT CARDIOVERSION N/A 02/10/2019   Procedure: TRANSESOPHAGEAL ECHOCARDIOGRAM (TEE);  Surgeon: [name], MD;  Location: [facility];  Service: Cardiovascular;  Laterality: N/A;  Polysubstance abuser   TONGUE SURGERY  ~ 2007   "related to lisp"   TRACHEOSTOMY  04/2016   [name] 05/01/2016    Medications:   Current Facility-Administered Medications:    0.9 %   sodium chloride infusion, 250 mL, Intravenous, Continuous, Erick Colace, NP, Stopped at 01/07/22 1025   acetaminophen (TYLENOL) tablet 650 mg, 650 mg, Oral, Q6H PRN, 650 mg at 01/11/22 2200 **OR** acetaminophen (TYLENOL) 160 MG/5ML solution 650 mg, 650 mg, Per Tube, Q6H PRN **OR** acetaminophen (TYLENOL) suppository 650 mg, 650 mg, Rectal, Q6H PRN, Virl Axe, MD   baclofen (LIORESAL) tablet 10 mg, 10 mg, Oral, TID, Domenic Polite, MD, 10 mg at 01/18/22 0858   busPIRone (BUSPAR) tablet 10 mg, 10 mg, Oral, TID, Domenic Polite, MD, 10 mg at 01/18/22 0858   Chlorhexidine Gluconate Cloth 2 % PADS 6 each, 6 each, Topical, Daily, Ramaswamy, Belva Crome, MD, 6 each at 01/17/22 0942   docusate sodium (COLACE) capsule 100 mg, 100 mg, Oral, BID, Ventura Sellers, RPH, 100 mg at 01/18/22 0858   gabapentin (NEURONTIN) capsule 300 mg, 300 mg, Oral, QHS, Ventura Sellers, RPH, 300 mg at 01/17/22 2157   lamoTRIgine (LAMICTAL) tablet 25 mg, 25 mg, Oral, BID, Ventura Sellers, RPH, 25 mg at 01/18/22 6950   menthol-cetylpyridinium (CEPACOL) lozenge 3 mg, 1 lozenge, Oral, PRN, Domenic Polite, MD, 3 mg at 01/08/22 2120   mirtazapine (REMERON SOL-TAB) disintegrating tablet 15 mg, 15 mg, Oral, QHS, Domenic Polite, MD, 15 mg at 01/17/22 2157   nicotine (NICODERM CQ - dosed in mg/24 hours) patch 14 mg, 14 mg, Transdermal, Daily, Domenic Polite, MD, 14 mg at 01/18/22 0859   ondansetron (ZOFRAN) injection 4 mg, 4 mg, Intravenous, Q6H PRN, Erick Colace, NP   OXcarbazepine (TRILEPTAL) tablet 150 mg, 150 mg, Oral, BID, Mannam, Praveen, MD, 150 mg at 01/18/22 0858   polyethylene glycol (MIRALAX / GLYCOLAX) packet 17 g, 17 g, Oral, Daily, Ventura Sellers, RPH, 17 g at 01/10/22 0901  Allergies: Allergies  Allergen Reactions   Pristiq [Desvenlafaxine Succinate Er] Rash   Desvenlafaxine Rash   Other Rash    Tide detergent  Tide detergent  Tide detergent    Sulfa Antibiotics Rash       Objective  Vital  signs:  Temp:  [98.2 F (36.8 C)-98.7 F (37.1 C)] 98.2 F (36.8 C) (12/09 0737) Pulse Rate:  [73-86] 81 (12/09 0737) Resp:  [16-18] 18 (12/09 0737) BP: (102-119)/(61-75) 119/61 (12/09 0737) SpO2:  [97 %-100 %] 99 % (12/09 0737) Weight:  [58.9 kg] 58.9 kg (12/09 0500)  Psychiatric Specialty Exam: Physical Exam Constitutional:      Appearance: the patient is not toxic-appearing.  Pulmonary:     Effort: Pulmonary effort is normal.  Neurological:     General: No focal deficit present.     Mental Status: the patient is alert and oriented to person, place, and time.   Review of Systems  Respiratory:  Negative for shortness  of breath.   Cardiovascular:  Negative for chest pain.  Gastrointestinal:  Negative for abdominal pain, constipation, diarrhea, nausea and vomiting.  Neurological:  Negative for headaches.      BP 119/61 (BP Location: Right Wrist)   Pulse 81   Temp 98.2 F (36.8 C) (Oral)   Resp 18   Ht _0  (1.676 m)   Wt 58.9 kg   LMP 12/24/2021 (Within Days)   SpO2 99%   BMI 20.96 kg/m   General Appearance: Fairly Groomed  Eye Contact:  Good  Speech:  Clear and Coherent  Volume:  Normal  Mood:  dysphoric  Affect:  Congruent  Thought Process:  Coherent  Orientation:  Full (Time, Place, and Person)  Thought Content: Logical   Suicidal Thoughts: yes but passive  Homicidal Thoughts:  denies  Memory:  poor  Judgement:  poor  Insight:  poor  Psychomotor Activity: decreased  Concentration:  Concentration: Good  Recall:  poor  Fund of Knowledge: Good  Language: Good  Akathisia:  No  Handed:    AIMS (if indicated): not done  Assets:  Communication Skills Desire for Improvement Financial Resources/Insurance Housing Leisure Time Physical Health  ADL's:  Intact  Cognition: WNL  Sleep:  Fair   Corena Pilgrim, MD

## 2022-01-18 NOTE — Progress Notes (Signed)
Spoke to [employer] at [facility] who confirmed pt has been accepted but they do not have a bed today. There is potential for bed availability second shift or tomorrow. MD updated. SW will follow.    Dellie Burns, MSW, LCSW [phone] (coverage)

## 2022-01-18 NOTE — Discharge Summary (Signed)
Physician Discharge Summary  Jane Watkins GQQ:[id number] DOB: 11-Aug-1995 DOA: 01/05/2022  PCP: Waunita Schooner, MD  Admit date: 01/05/2022 Discharge date: 01/18/2022  Admitted From: Home Disposition: Transfer to [facility] inpatient psychiatry   Discharge Condition: Stable, continues on involuntary commitment CODE STATUS: Full code Diet recommendation: Regular diet  History of present illness:  Jane Watkins is a 26 y.o. 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  She received CPR by EMTs and ROSC was achieved following 20 minutes and she was brought to the ED for further evaluation.  Patient was initially admitted to the intensive care unit, eventually extubated on 11/27 and transferred to the hospital service on 11/29.  Psychiatry was consulted and is recommending transfer to inpatient psychiatric service now that patient is medically stable.     Patient is currently medically stable for discharge to inpatient psych unit once bed available.  Patient is currently under IVC per psychiatry recommendations.  Hospital course:  Acute hypoxic and toxic metabolic encephalopathy Cardiac arrest, asystole Etiology likely secondary to cardiac arrest following fentanyl and cocaine overdose. Was initially requiring resuscitative efforts with achieving ROSC after 20 minutes and was initially admitted to the PCCM/ICU service. Mental status is much improved during hospitalization, and likely now back to baseline.   Polysubstance abuse, current PTSD/depression Suicidal ideation Patient reports suicidal thoughts on the day of admission in which she was admitted following resuscitative efforts after  being found down, asystole after overdose on fentanyl and cocaine.  Psychiatry following, currently on IVC.  Continue BuSpar 10 mg p.o. 3 times daily, gabapentin 300 mg p.o. nightly, Seroquel 50 mg p.o. nightly.  Transferring to [facility] inpatient psychiatric unit for further care.   Acute hypoxic respiratory failure, POA: Resolved Aspiration pneumonia Etiology secondary to overdose and aspiration event during episode.  Patient completed 5-day course of Unasyn.  Currently on room air.   Severe metabolic acidosis: Resolved   Shock liver Liver enzymes down trending, etiology likely secondary to poor perfusion following cardiopulmonary arrest.     Hypoglycemic episode: Resolved Etiology likely secondary to cardiac arrest which is now improved, blood sugars have been stable and will now discontinued CBG checks.   Hx seizures Trileptal 150 mg p.o. twice daily, Lamictal 25 mg p.o. twice daily   Hypomagnesemia: Repleted during hospitalization  Discharge Diagnoses:  Principal Problem:   Cardiac arrest [facility]) Active Problems:   Polysubstance abuse ([place])   Substance-induced disorder [facility])    Discharge Instructions  Discharge Instructions     Diet general   Complete by: As directed    Increase activity slowly   Complete by: As directed       Allergies as of 01/18/2022       Reactions   Pristiq [desvenlafaxine Succinate Er] Rash   Desvenlafaxine Rash   Other Rash   Tide detergent  Tide detergent  Tide detergent    Sulfa Antibiotics Rash        Medication List     STOP taking these medications    atorvastatin 20 MG tablet Commonly known as: LIPITOR   nitrofurantoin (macrocrystal-monohydrate) 100 MG capsule Commonly known as: MACROBID   propranolol 10 MG tablet Commonly known as: INDERAL   spironolactone 50 MG tablet Commonly known as: ALDACTONE  tiZANidine 4 MG tablet Commonly known as: ZANAFLEX       TAKE these medications    acetaminophen 325 MG  tablet Commonly known as: TYLENOL Take 2 tablets (650 mg total) by mouth every 4 (four) hours as needed for mild pain (or temp > 37.5 C (99.5 F)).   baclofen 10 MG tablet Commonly known as: LIORESAL Take 1 tablet (10 mg total) by mouth 3 (three) times daily.   busPIRone 10 MG tablet Commonly known as: BUSPAR TAKE 1 TABLET BY MOUTH THREE TIMES A DAY   gabapentin 300 MG capsule Commonly known as: Neurontin Take 1 capsule (300 mg total) by mouth at bedtime.   lamoTRIgine 25 MG tablet Commonly known as: LAMICTAL Take 1 tablet (25 mg total) by mouth 2 (two) times daily. What changed:  how much to take when to take this   mirtazapine 15 MG tablet Commonly known as: REMERON Take 15 mg by mouth at bedtime.   naloxone 4 MG/0.1ML Liqd nasal spray kit Commonly known as: NARCAN ADMINISTER A SINGLE SPRAY IN ONE NOSTRIL UPON SIGNS OF OPIOID OVERDOSE. REPEAT EVERY 2 - 3 MINUTES IF NO RESPONSE IN ALTERNATING NOSTRIL TIL EMS ARRIVES.   nicotine 21 mg/24hr patch Commonly known as: NICODERM CQ - dosed in mg/24 hours Place 1 patch (21 mg total) onto the skin daily.   OXcarbazepine 150 MG tablet Commonly known as: TRILEPTAL Take 150 mg by mouth 2 (two) times daily.   QUEtiapine 50 MG tablet Commonly known as: SEROquel Take 1 tablet (50 mg total) by mouth at bedtime.   SUMAtriptan 50 MG tablet Commonly known as: IMITREX Take 1 tablet by mouth every 2 hours as needed for migraine. May repeat in 2 hours if headache persists or recurs.        Allergies  Allergen Reactions   Pristiq [Desvenlafaxine Succinate Er] Rash   Desvenlafaxine Rash   Other Rash    Tide detergent  Tide detergent  Tide detergent    Sulfa Antibiotics Rash    Consultations: PCCM Psychiatry   Procedures/Studies: DG Chest Port 1 View  Result Date: 01/08/2022 CLINICAL DATA:  Acute respiratory failure. Recheck right lung density. EXAM: PORTABLE CHEST 1 VIEW COMPARISON:  AP chest 01/06/2022, 01/05/2022,  02/07/2019 FINDINGS: Interval removal of endotracheal tube and enteric tube. Cardiac silhouette and mediastinal contours are within normal limits. Redemonstration of vague density overlying the inferior medial right lung. Again this does not obscure the right heart border. This may be within the right lower lobe. It is again new from 01/05/2022 and appears similar to 01/06/2022. The right lung is clear. No definite pleural effusion. No pneumothorax. No acute skeletal abnormality. IMPRESSION: 1. Interval removal of endotracheal tube and enteric tube. 2. Redemonstration of vague density overlying the inferior medial right lung. This may be within the right lower lobe. It is again new from 01/05/2022 and appears similar to 01/06/2022. This may represent developing infection or sequela of aspiration. Electronically Signed   By: Yvonne Kendall M.D.   On: 01/08/2022 08:38   DG Swallowing Func-Speech Pathology  Result Date: 01/07/2022 Table formatting from the original result was not included. Objective Swallowing Evaluation: Type of Study: MBS-Modified Barium Swallow Study  Patient Details Name: Jane Watkins MRN: [id number] Date of Birth: [birth year] Today's Date: 01/07/2022 Time: SLP Start Time (ACUTE ONLY): 29 -SLP Stop Time (ACUTE ONLY): 1055 SLP Time Calculation (min) (ACUTE ONLY): 15 min Past Medical History: Past Medical History: Diagnosis Date  AKI (acute kidney injury) ([place]) 2018  "  from overdose"  Anxiety   Chlamydia   Daily headache   Depression   Drug overdose 04/2016  [name] 05/01/2016  GERD (gastroesophageal reflux disease)   "when I was younger; gone now" (09/21/2017)  IV drug abuse ([place]) 09/20/2017  Migraine   "q couple weeks" (09/21/2017)  Opioid abuse ([place])   Overdose   Stroke [facility])  Past Surgical History: Past Surgical History: Procedure Laterality Date  APPENDECTOMY  04/2013  I & D EXTREMITY Left 09/20/2017  Procedure: IRRIGATION AND DEBRIDEMENT LEFT ARM;  Surgeon: [name], MD;  Location: [place];   Service: Orthopedics;  Laterality: Left;  TEE WITHOUT CARDIOVERSION N/A 02/10/2019  Procedure: TRANSESOPHAGEAL ECHOCARDIOGRAM (TEE);  Surgeon: [name], MD;  Location: [facility]S;  Service: Cardiovascular;  Laterality: N/A;  Polysubstance abuser  TONGUE SURGERY  ~ 2007  "related to lisp"  TRACHEOSTOMY  04/2016  /notes 05/01/2016 HPI: Patient is a 26 y.o. 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 She presented to the ED on 01/05/22 with witnessed overdose of crack cocaine and fentanyl. She was found pulseless and in asystole; downtime unclear, no bystander CPR, time to ROSC estimated at 20 minutes, no response to Narcan. She was changed from [place] to ETT for airway. CT head ordered but did not show any acute changes.  PCXR showed lungs to be clear, no infiltrate. She was extubated to supplemental oxygen via nasal cannula on 01/06/22. She failed [facility] with nursing and is NPO awaiting SLP evaluation.  Subjective: more alert than this morning, participates fully  Recommendations for follow up therapy are one component of a multi-disciplinary discharge planning process, led by the attending physician.  Recommendations may be updated based on patient status, additional functional criteria and insurance authorization. Assessment / Plan / Recommendation   01/07/2022   1:33 PM Clinical Impressions Clinical Impression Patient presents with a mild oropharyngeal dysphagia as per this MBS. During oral phase, she exhibited mildly delayed mastication with mechanical soft solids but with full clearance of oral cavity post swallow. During pharyngeal phase of swallow, patient exhibited swallow initiation delays at level of vallecular sinus with puree, mechanical soft, nectar and thin via cup sips and swallow initiation delay at level of pyriform sinus with thin liquids via straw sips. No significant pharyngeal  residuals remained post initial swallows with any of the tested consistencies. Trace aspiration occured with thin liquids via straw sip, resulting in immediate cough response from patient. She also exhibited trace aspiration which was sensed and occuring during the swallow with cup sip of thin liquids towards end of study. No aspiration or penetration observed with nectar thick liquids. 86m barium tablet transited through pharynx and UES without difficulty; esophageal sweep did not reveal any stasis or concerns for dysmotility. SLP recommending initiate PO diet of Dys 3 (mechanical soft) solids and nectar thick liquids and allow patient to have plain/thin water in between meals.     01/07/2022   1:32 PM Treatment Recommendations Treatment Recommendations Therapy as outlined in treatment plan below     01/07/2022   1:32 PM Prognosis Prognosis for Safe Diet Advancement Good Barriers to Reach Goals Cognitive deficits   01/07/2022   1:32 PM Diet Recommendations SLP Diet Recommendations Dysphagia 3 (Mech soft) solids;Nectar thick liquid Liquid Administration via Cup;No straw Medication Administration Whole meds with puree Compensations Minimize environmental distractions;Slow rate;Small sips/bites Postural Changes Seated upright at 90 degrees     01/07/2022  1:32 PM Other Recommendations Oral Care Recommendations Oral care BID Follow Up Recommendations Follow physician's recommendations for discharge plan and follow up therapies Functional Status Assessment Patient has had a recent decline in their functional status and demonstrates the ability to make significant improvements in function in a reasonable and predictable amount of time.   01/07/2022   1:32 PM Frequency and Duration  Speech Therapy Frequency (ACUTE ONLY) min 2x/week Treatment Duration 2 weeks     01/07/2022   1:28 PM Oral Phase Oral Phase Impaired Oral - Nectar Cup [facility] Oral - Thin Cup WFL Oral - Puree WFL Oral - Mech Soft Impaired mastication Oral - Pill  [facility]    01/07/2022   1:30 PM Pharyngeal Phase Pharyngeal Phase Impaired Pharyngeal- Nectar Cup Delayed swallow initiation-vallecula Pharyngeal- Thin Cup Delayed swallow initiation-vallecula;Penetration/Aspiration during swallow Pharyngeal Material enters airway, passes BELOW cords and not ejected out despite cough attempt by patient Pharyngeal- Thin Straw Penetration/Aspiration during swallow;Reduced airway/laryngeal closure;Delayed swallow initiation-pyriform sinuses Pharyngeal Material enters airway, passes BELOW cords and not ejected out despite cough attempt by patient Pharyngeal- Puree Delayed swallow initiation-vallecula Pharyngeal- Mechanical Soft Delayed swallow initiation-vallecula Pharyngeal- Pill [facility]    01/07/2022   1:32 PM Cervical Esophageal Phase  Cervical Esophageal Phase [facility] Sonia Baller, MA, CCC-SLP Speech Therapy                     EEG adult  Result Date: 01/06/2022 Lora Havens, MD     01/06/2022  8:25 AM Patient Name: Jane Watkins MRN: [id number] Epilepsy Attending: Lora Havens Referring Physician/Provider: Erick Colace, NP Date: 01/05/2022 Duration: 21.50 mins Patient history: 26 year old female with altered mental status.  EEG to evaluate for seizure. Level of alertness: lethargic AEDs during EEG study: Lamotrigine, gabapentin Technical aspects: This EEG study was done with scalp electrodes positioned according to the 10-20 International system of electrode placement. Electrical activity was reviewed with band pass filter of 1-_0 , sensitivity of 7 uV/mm, display speed of 24m/sec with a _1  notched filter applied as appropriate. EEG data were recorded continuously and digitally stored.  Video monitoring was available and reviewed as appropriate. Description: EEG showed continuous generalized 3 to 6 Hz theta-delta slowing. Hyperventilation and photic stimulation were not performed.   ABNORMALITY - Continuous slow, generalized IMPRESSION: This study is suggestive of  moderate to severe diffuse encephalopathy, nonspecific etiology. No seizures or epileptiform discharges were seen throughout the recording. PLora Havens  DG Chest Port 1 View  Result Date: 01/06/2022 CLINICAL DATA:  [age]year old female with history of respiratory failure. EXAM: PORTABLE CHEST 1 VIEW COMPARISON:  Chest x-ray 01/05/2022. FINDINGS: An endotracheal tube is in place with tip 3.0 cm above the carina. A nasogastric tube is seen extending into the stomach, however, the tip of the nasogastric tube extends below the lower margin of the image. Lung volumes are normal. New vague density in the medial aspect of the right mid to lower lung, without obscuration of the right heart border or the medial right hemidiaphragm. No pleural effusions. No pneumothorax. No evidence of pulmonary edema. Heart size is normal. Upper mediastinal contours are within normal limits. IMPRESSION: 1. Support apparatus, as above. 2. New vague density in the medial aspect of the right mid to lower lung, concerning for developing airspace consolidation, potentially infection or sequela of aspiration. Electronically Signed   By: DVinnie LangtonM.D.   On: 01/06/2022 05:42   ECHOCARDIOGRAM COMPLETE  Result Date: 01/05/2022    ECHOCARDIOGRAM  REPORT   Patient Name:   Jane Watkins Date of Exam: 01/05/2022 Medical Rec #:  [id number]      Height:       66.0 in Accession #:    [id number]     Weight:       127.0 lb Date of Birth:  01/27/1996       BSA:          1.649 m Patient Age:    26 years       BP:           100/60 mmHg Patient Gender: F              HR:           108 bpm. Exam Location:  Inpatient Procedure: 2D Echo, Cardiac Doppler and Color Doppler                        STAT ECHO Reported to: Dr Dani Gobble Croitoru on 01/05/2022 1:57:00 PM. Indications:    Cardiomegaly  History:        Patient has prior history of Echocardiogram examinations, most                 recent 02/10/2019. Risk Factors:Dyslipidemia and Current Smoker.                  Polysubstance abuse. Hx CVA.  Sonographer:    Clayton Lefort RDCS (AE) Referring Phys: Elgie Congo  Sonographer Comments: Echo performed with patient supine and on artificial respirator. IMPRESSIONS  1. Left ventricular ejection fraction, by estimation, is 55 to 60%. The left ventricle has normal function. The left ventricle has no regional wall motion abnormalities. Left ventricular diastolic parameters were normal.  2. Right ventricular systolic function is normal. The right ventricular size is normal. Tricuspid regurgitation signal is inadequate for assessing PA pressure.  3. The mitral valve is grossly normal. Trivial mitral valve regurgitation.  4. The aortic valve is tricuspid. Aortic valve regurgitation is not visualized. Aortic valve mean gradient measures 4.0 mmHg.  5. Unable to estimate CVP. Comparison(s): Prior images reviewed side by side. LVEF remains normal range at 55-60%. FINDINGS  Left Ventricle: Left ventricular ejection fraction, by estimation, is 55 to 60%. The left ventricle has normal function. The left ventricle has no regional wall motion abnormalities. The left ventricular internal cavity size was normal in size. There is  no left ventricular hypertrophy. Left ventricular diastolic parameters were normal. Right Ventricle: The right ventricular size is normal. No increase in right ventricular wall thickness. Right ventricular systolic function is normal. Tricuspid regurgitation signal is inadequate for assessing PA pressure. Left Atrium: Left atrial size was normal in size. Right Atrium: Right atrial size was normal in size. Pericardium: There is no evidence of pericardial effusion. Mitral Valve: The mitral valve is grossly normal. Trivial mitral valve regurgitation. Tricuspid Valve: The tricuspid valve is grossly normal. Tricuspid valve regurgitation is trivial. Aortic Valve: The aortic valve is tricuspid. Aortic valve regurgitation is not visualized. Aortic valve mean  gradient measures 4.0 mmHg. Aortic valve peak gradient measures 6.7 mmHg. Aortic valve area, by VTI measures 1.96 cm. Pulmonic Valve: The pulmonic valve was not well visualized. Pulmonic valve regurgitation is not visualized. Aorta: The aortic root is normal in size and structure. Venous: Unable to estimate CVP. IVC assessment for right atrial pressure unable to be performed due to mechanical ventilation. IAS/Shunts: The interatrial septum was not well visualized.  LEFT VENTRICLE  PLAX 2D LVIDd:         3.70 cm LVIDs:         2.60 cm LV PW:         1.00 cm LV IVS:        0.80 cm LVOT diam:     1.90 cm LV SV:         44 LV SV Index:   27 LVOT Area:     2.84 cm  RIGHT VENTRICLE             IVC RV Basal diam:  2.70 cm     IVC diam: 2.10 cm RV S prime:     12.00 cm/s TAPSE (M-mode): 2.0 cm LEFT ATRIUM             Index        RIGHT ATRIUM           Index LA diam:        2.30 cm 1.39 cm/m   RA Area:     10.60 cm LA Vol (A2C):   29.8 ml 18.07 ml/m  RA Volume:   22.80 ml  13.83 ml/m LA Vol (A4C):   10.8 ml 6.55 ml/m LA Biplane Vol: 18.8 ml 11.40 ml/m  AORTIC VALVE AV Area (Vmax):    2.18 cm AV Area (Vmean):   2.07 cm AV Area (VTI):     1.96 cm AV Vmax:           129.00 cm/s AV Vmean:          89.800 cm/s AV VTI:            0.226 m AV Peak Grad:      6.7 mmHg AV Mean Grad:      4.0 mmHg LVOT Vmax:         99.00 cm/s LVOT Vmean:        65.500 cm/s LVOT VTI:          0.156 m LVOT/AV VTI ratio: 0.69  AORTA Ao Root diam: 2.70 cm  SHUNTS Systemic VTI:  0.16 m Systemic Diam: 1.90 cm Rozann Lesches MD Electronically signed by Rozann Lesches MD Signature Date/Time: 01/05/2022/2:04:12 PM    Final    CT HEAD WO CONTRAST  Result Date: 01/05/2022 CLINICAL DATA:  Post CPR/overdose. EXAM: CT HEAD WITHOUT CONTRAST TECHNIQUE: Contiguous axial images were obtained from the base of the skull through the vertex without intravenous contrast. RADIATION DOSE REDUCTION: This exam was performed according to the departmental  dose-optimization program which includes automated exposure control, adjustment of the mA and/or kV according to patient size and/or use of iterative reconstruction technique. COMPARISON:  CT head 04/29/2021. FINDINGS: Brain: There is no evidence of acute intracranial hemorrhage, extra-axial fluid collection, or acute infarct. There is no evidence of global anoxic injury by CT. Remote infarcts in the right basal ganglia and high right frontal and parietal lobes is unchanged, with stable slight ex vacuo dilatation of the body of the right lateral ventricle. Background parenchymal volume is otherwise normal. The ventricles are otherwise normal in size. There is no mass lesion.  There is no mass effect or midline shift. Vascular: No hyperdense vessel or unexpected calcification. Skull: Normal. Negative for fracture or focal lesion. Sinuses/Orbits: Layering fluid in the sinuses is likely related to instrumentation. The globes and orbits are unremarkable. Other: None. IMPRESSION: 1. No evidence of acute intracranial pathology. No definite evidence of anoxic injury by CT. Short interval follow-up CT or MRI may be  considered if there is continued clinical concern. 2. Unchanged remote infarcts in the right cerebral hemisphere. Electronically Signed   By: Valetta Mole M.D.   On: 01/05/2022 13:39   DG Abd Portable 1V  Result Date: 01/05/2022 CLINICAL DATA:  NGT placement EXAM: PORTABLE ABDOMEN - 1 VIEW COMPARISON:  None Available. FINDINGS: The bowel gas pattern is normal. No radio-opaque calculi or other significant radiographic abnormality are seen. NG tube tip superimposed with the stomach below the diaphragm. IMPRESSION: Negative. Electronically Signed   By: Sammie Bench M.D.   On: 01/05/2022 13:07   DG Chest Port 1 View  Result Date: 01/05/2022 CLINICAL DATA:  Intubated EXAM: PORTABLE CHEST 1 VIEW COMPARISON:  02/07/2019. FINDINGS: The heart size and mediastinal contours are within normal limits. Both lungs  are clear. No pneumothorax or pleural effusion. Endotracheal tube tip is at the thoracic inlet. NG tube tip is below the diaphragm and off x-ray. IMPRESSION: Lungs are clear. Electronically Signed   By: Sammie Bench M.D.   On: 01/05/2022 13:06     Subjective: Patient seen examined bedside, resting comfortably.  Transferring to [facility] inpatient psychiatric unit for further care and treatment.  Discharge Exam: Vitals:   01/18/22 0500 01/18/22 0737  BP: 102/70 119/61  Pulse: 73 81  Resp: 16 18  Temp: 98.7 F (37.1 C) 98.2 F (36.8 C)  SpO2: 97% 99%   Vitals:   01/17/22 1635 01/17/22 2011 01/18/22 0500 01/18/22 0737  BP: 118/69 112/75 102/70 119/61  Pulse: 86 82 73 81  Resp: _0 Temp: 98.4 F (36.9 C) 98.7 F (37.1 C) 98.7 F (37.1 C) 98.2 F (36.8 C)  TempSrc: Oral Oral Oral Oral  SpO2: 100% 99% 97% 99%  Weight:   58.9 kg   Height:        Physical Exam: GEN: NAD, alert and oriented x 3, thin in appearance HEENT: NCAT, PERRL, EOMI, sclera clear, MMM PULM: CTAB w/o wheezes/crackles, normal respiratory effort, on room air CV: RRR w/o M/G/R GI: abd soft, NTND, NABS, no R/G/M MSK: no peripheral edema, muscle strength globally intact 5/5 bilateral upper/lower extremities NEURO: CN II-XII intact, no focal deficits, sensation to light touch intact PSYCH: Depressed mood, flat affect Integumentary: dry/intact, no rashes or wounds    The results of significant diagnostics from this hospitalization (including imaging, microbiology, ancillary and laboratory) are listed below for reference.     Microbiology: Recent Results (from the past 240 hour(s))  SARS Coronavirus 2 by RT PCR (hospital order, performed in [facility] hospital lab) *cepheid single result test* Anterior Nasal Swab     Status: None   Collection Time: 01/11/22  2:28 PM   Specimen: Anterior Nasal Swab  Result Value Ref Range Status   SARS Coronavirus 2 by RT PCR NEGATIVE NEGATIVE Final    Comment:  Performed at [facility], [place] [address] [place], [place] [address]  SARS Coronavirus 2 by RT PCR (hospital order, performed in [facility] hospital lab) *cepheid single result test* Anterior Nasal Swab     Status: None   Collection Time: 01/17/22  9:45 PM   Specimen: Anterior Nasal Swab  Result Value Ref Range Status   SARS Coronavirus 2 by RT PCR NEGATIVE NEGATIVE Final    Comment: (NOTE) SARS-CoV-2 target nucleic acids are NOT DETECTED.  The SARS-CoV-2 RNA is generally detectable in upper and lower respiratory specimens during the acute phase of infection. The lowest concentration of SARS-CoV-2 viral copies this assay can  detect is 250 copies / mL. A negative result does not preclude SARS-CoV-2 infection and should not be used as the sole basis for treatment or other patient management decisions.  A negative result may occur with improper specimen collection / handling, submission of specimen other than nasopharyngeal swab, presence of viral mutation(s) within the areas targeted by this assay, and inadequate number of viral copies (<250 copies / mL). A negative result must be combined with clinical observations, patient history, and epidemiological information.  Fact Sheet for Patients:   https://www.patel.info/  Fact Sheet for Healthcare Providers: https://hall.com/  This test is not yet approved or  cleared by the [place] FDA and has been authorized for detection and/or diagnosis of SARS-CoV-2 by FDA under an Emergency Use Authorization (EUA).  This EUA will remain in effect (meaning this test can be used) for the duration of the COVID-19 declaration under Section 564(b)(1) of the Act, 21 U.S.C. section 360bbb-3(b)(1), unless the authorization is terminated or revoked sooner.  Performed at [facility], [place] [address] [place],  [address]      Labs: BNP (last 3 results) No results for input(s): "BNP"  in the last 8760 hours. Basic Metabolic Panel: Recent Labs  Lab 01/14/22 1107  NA 139  K 4.1  CL 111  CO2 22  GLUCOSE 96  BUN 18  CREATININE 0.78  CALCIUM 9.0   Liver Function Tests: Recent Labs  Lab 01/14/22 1107  AST 30  ALT 116*  ALKPHOS 65  BILITOT 0.4  PROT 7.0  ALBUMIN 3.8   No results for input(s): "LIPASE", "AMYLASE" in the last 168 hours. No results for input(s): "AMMONIA" in the last 168 hours. CBC: No results for input(s): "WBC", "NEUTROABS", "HGB", "HCT", "MCV", "PLT" in the last 168 hours. Cardiac Enzymes: No results for input(s): "CKTOTAL", "CKMB", "CKMBINDEX", "TROPONINI" in the last 168 hours. BNP: Invalid input(s): "POCBNP" CBG: Recent Labs  Lab 01/15/22 1131 01/15/22 1600 01/15/22 2115 01/16/22 0026 01/16/22 0638  GLUCAP 85 100* 82 96 102*   D-Dimer No results for input(s): "DDIMER" in the last 72 hours. Hgb A1c No results for input(s): "HGBA1C" in the last 72 hours. Lipid Profile No results for input(s): "CHOL", "HDL", "LDLCALC", "TRIG", "CHOLHDL", "LDLDIRECT" in the last 72 hours. Thyroid function studies No results for input(s): "TSH", "T4TOTAL", "T3FREE", "THYROIDAB" in the last 72 hours.  Invalid input(s): "FREET3" Anemia work up No results for input(s): "VITAMINB12", "FOLATE", "FERRITIN", "TIBC", "IRON", "RETICCTPCT" in the last 72 hours. Urinalysis    Component Value Date/Time   COLORURINE YELLOW 01/05/2022 1617   APPEARANCEUR HAZY (A) 01/05/2022 1617   APPEARANCEUR Clear 04/07/2019 1405   LABSPEC 1.013 01/05/2022 1617   PHURINE 5.0 01/05/2022 1617   GLUCOSEU 150 (A) 01/05/2022 1617   HGBUR MODERATE (A) 01/05/2022 1617   BILIRUBINUR NEGATIVE 01/05/2022 1617   BILIRUBINUR neg 08/27/2021 1503   BILIRUBINUR Negative 04/07/2019 1405   KETONESUR 20 (A) 01/05/2022 1617   PROTEINUR 30 (A) 01/05/2022 1617   UROBILINOGEN 0.2 08/27/2021 1503   NITRITE NEGATIVE 01/05/2022 1617   LEUKOCYTESUR NEGATIVE 01/05/2022 1617   Sepsis  Labs No results for input(s): "WBC" in the last 168 hours.  Invalid input(s): "PROCALCITONIN", "LACTICIDVEN" Microbiology Recent Results (from the past 240 hour(s))  SARS Coronavirus 2 by RT PCR (hospital order, performed in [facility] hospital lab) *cepheid single result test* Anterior Nasal Swab     Status: None   Collection Time: 01/11/22  2:28 PM   Specimen: Anterior Nasal Swab  Result Value Ref Range Status   SARS Coronavirus 2 by RT PCR NEGATIVE NEGATIVE Final    Comment: Performed at [facility], [place] [address], [place], [place] [address]  SARS Coronavirus 2 by RT PCR (hospital order, performed in [facility] hospital lab) *cepheid single result test* Anterior Nasal Swab     Status: None   Collection Time: 01/17/22  9:45 PM   Specimen: Anterior Nasal Swab  Result Value Ref Range Status   SARS Coronavirus 2 by RT PCR NEGATIVE NEGATIVE Final    Comment: (NOTE) SARS-CoV-2 target nucleic acids are NOT DETECTED.  The SARS-CoV-2 RNA is generally detectable in upper and lower respiratory specimens during the acute phase of infection. The lowest concentration of SARS-CoV-2 viral copies this assay can detect is 250 copies / mL. A negative result does not preclude SARS-CoV-2 infection and should not be used as the sole basis for treatment or other patient management decisions.  A negative result may occur with improper specimen collection / handling, submission of specimen other than nasopharyngeal swab, presence of viral mutation(s) within the areas targeted by this assay, and inadequate number of viral copies (<250 copies / mL). A negative result must be combined with clinical observations, patient history, and epidemiological information.  Fact Sheet for Patients:   https://www.patel.info/  Fact Sheet for Healthcare Providers: https://hall.com/  This test is not yet approved or  cleared by the [place] FDA and has been  authorized for detection and/or diagnosis of SARS-CoV-2 by FDA under an Emergency Use Authorization (EUA).  This EUA will remain in effect (meaning this test can be used) for the duration of the COVID-19 declaration under Section 564(b)(1) of the Act, 21 U.S.C. section 360bbb-3(b)(1), unless the authorization is terminated or revoked sooner.  Performed at [facility], [place] [address] [place], [place] [address]      Time coordinating discharge: Over 30 minutes  SIGNED:   Dena Esperanza J [place] DO  [facility] 01/18/2022, 4:48 PM

## 2022-01-19 ENCOUNTER — Inpatient Hospital Stay
Admission: AD | Admit: 2022-01-19 | Discharge: 2022-01-28 | DRG: 885 | Disposition: A | Discharge status: Home / Self Care | Payer: Medicare HMO | Source: Intra-hospital | Attending: Psychiatry | Admitting: Psychiatry

## 2022-01-19 ENCOUNTER — Encounter: Payer: Self-pay | Admitting: Psychiatry

## 2022-01-19 ENCOUNTER — Other Ambulatory Visit: Payer: Self-pay

## 2022-01-19 DIAGNOSIS — F1110 Opioid abuse, uncomplicated: Secondary | ICD-10-CM | POA: Diagnosis present

## 2022-01-19 DIAGNOSIS — F419 Anxiety disorder, unspecified: Secondary | ICD-10-CM | POA: Diagnosis present

## 2022-01-19 DIAGNOSIS — I469 Cardiac arrest, cause unspecified: Secondary | ICD-10-CM | POA: Diagnosis not present

## 2022-01-19 DIAGNOSIS — B001 Herpesviral vesicular dermatitis: Secondary | ICD-10-CM | POA: Diagnosis present

## 2022-01-19 DIAGNOSIS — T405X2A Poisoning by cocaine, intentional self-harm, initial encounter: Secondary | ICD-10-CM | POA: Diagnosis present

## 2022-01-19 DIAGNOSIS — R69 Illness, unspecified: Secondary | ICD-10-CM | POA: Diagnosis not present

## 2022-01-19 DIAGNOSIS — G43909 Migraine, unspecified, not intractable, without status migrainosus: Secondary | ICD-10-CM | POA: Diagnosis present

## 2022-01-19 DIAGNOSIS — G4700 Insomnia, unspecified: Secondary | ICD-10-CM | POA: Diagnosis not present

## 2022-01-19 DIAGNOSIS — R498 Other voice and resonance disorders: Secondary | ICD-10-CM | POA: Diagnosis not present

## 2022-01-19 DIAGNOSIS — Z8673 Personal history of transient ischemic attack (TIA), and cerebral infarction without residual deficits: Secondary | ICD-10-CM | POA: Diagnosis not present

## 2022-01-19 DIAGNOSIS — R490 Dysphonia: Secondary | ICD-10-CM | POA: Diagnosis present

## 2022-01-19 DIAGNOSIS — T40412A Poisoning by fentanyl or fentanyl analogs, intentional self-harm, initial encounter: Secondary | ICD-10-CM | POA: Diagnosis present

## 2022-01-19 DIAGNOSIS — F332 Major depressive disorder, recurrent severe without psychotic features: Principal | ICD-10-CM | POA: Diagnosis present

## 2022-01-19 DIAGNOSIS — M62838 Other muscle spasm: Secondary | ICD-10-CM | POA: Diagnosis present

## 2022-01-19 DIAGNOSIS — F17210 Nicotine dependence, cigarettes, uncomplicated: Secondary | ICD-10-CM | POA: Diagnosis present

## 2022-01-19 DIAGNOSIS — H9319 Tinnitus, unspecified ear: Secondary | ICD-10-CM | POA: Diagnosis not present

## 2022-01-19 DIAGNOSIS — R519 Headache, unspecified: Secondary | ICD-10-CM | POA: Diagnosis not present

## 2022-01-19 DIAGNOSIS — K5900 Constipation, unspecified: Secondary | ICD-10-CM | POA: Diagnosis not present

## 2022-01-19 MED ORDER — POLYETHYLENE GLYCOL 3350 17 G PO PACK
17.0000 g | PACK | Freq: Every day | ORAL | Status: DC
  Filled 2022-01-19 (×3): qty 1

## 2022-01-19 MED ORDER — NICOTINE 14 MG/24HR TD PT24
14.0000 mg | MEDICATED_PATCH | Freq: Every day | TRANSDERMAL | Status: DC
  Administered 2022-01-19 – 2022-01-28 (×10): 14 mg via TRANSDERMAL
  Filled 2022-01-19 (×10): qty 1

## 2022-01-19 MED ORDER — DOCUSATE SODIUM 100 MG PO CAPS
100.0000 mg | ORAL_CAPSULE | Freq: Two times a day (BID) | ORAL | Status: DC
  Administered 2022-01-19 – 2022-01-28 (×18): 100 mg via ORAL
  Filled 2022-01-19 (×18): qty 1

## 2022-01-19 MED ORDER — MIRTAZAPINE 15 MG PO TBDP
15.0000 mg | ORAL_TABLET | Freq: Every day | ORAL | Status: DC
  Administered 2022-01-19: 15 mg via ORAL
  Filled 2022-01-19: qty 1

## 2022-01-19 MED ORDER — MENTHOL 3 MG MT LOZG
1.0000 | LOZENGE | OROMUCOSAL | Status: DC | PRN
  Filled 2022-01-19: qty 9

## 2022-01-19 MED ORDER — MAGNESIUM HYDROXIDE 400 MG/5ML PO SUSP: 30.0000 mL | Freq: Every day | ORAL | Status: DC | PRN

## 2022-01-19 MED ORDER — LAMOTRIGINE 25 MG PO TABS
25.0000 mg | ORAL_TABLET | Freq: Two times a day (BID) | ORAL | Status: DC
  Administered 2022-01-19 – 2022-01-28 (×18): 25 mg via ORAL
  Filled 2022-01-19 (×18): qty 1

## 2022-01-19 MED ORDER — BACLOFEN 10 MG PO TABS
10.0000 mg | ORAL_TABLET | Freq: Three times a day (TID) | ORAL | Status: DC
  Administered 2022-01-19 – 2022-01-28 (×27): 10 mg via ORAL
  Filled 2022-01-19 (×27): qty 1

## 2022-01-19 MED ORDER — TRAZODONE HCL 50 MG PO TABS
50.0000 mg | ORAL_TABLET | Freq: Every evening | ORAL | Status: DC | PRN
  Administered 2022-01-19: 50 mg via ORAL
  Filled 2022-01-19: qty 1

## 2022-01-19 MED ORDER — OXCARBAZEPINE 150 MG PO TABS
150.0000 mg | ORAL_TABLET | Freq: Two times a day (BID) | ORAL | Status: DC
  Administered 2022-01-19 – 2022-01-28 (×18): 150 mg via ORAL
  Filled 2022-01-19 (×20): qty 1

## 2022-01-19 MED ORDER — BUSPIRONE HCL 5 MG PO TABS
10.0000 mg | ORAL_TABLET | Freq: Three times a day (TID) | ORAL | Status: DC
  Administered 2022-01-19 – 2022-01-28 (×27): 10 mg via ORAL
  Filled 2022-01-19 (×27): qty 2

## 2022-01-19 MED ORDER — ALUM & MAG HYDROXIDE-SIMETH 200-200-20 MG/5ML PO SUSP: 30.0000 mL | ORAL | Status: DC | PRN

## 2022-01-19 MED ORDER — ACETAMINOPHEN 325 MG PO TABS
650.0000 mg | ORAL_TABLET | Freq: Four times a day (QID) | ORAL | Status: DC | PRN
  Administered 2022-01-20 – 2022-01-21 (×2): 650 mg via ORAL
  Filled 2022-01-19 (×2): qty 2

## 2022-01-19 MED ORDER — GABAPENTIN 300 MG PO CAPS
300.0000 mg | ORAL_CAPSULE | Freq: Every day | ORAL | Status: DC
  Administered 2022-01-19 – 2022-01-27 (×9): 300 mg via ORAL
  Filled 2022-01-19 (×9): qty 1

## 2022-01-19 MED ORDER — ONDANSETRON HCL 4 MG/2ML IJ SOLN: 4.0000 mg | Freq: Four times a day (QID) | INTRAMUSCULAR | Status: DC | PRN

## 2022-01-19 NOTE — TOC Transition Note (Signed)
Transition of Care [facility]) - CM/SW Discharge Note   Patient Details  Name: Jane Watkins MRN: [id number] Date of Birth: 12-24-1995  [facility]) CM/SW Contact:  Deatra Robinson, LCSW Phone Number: 01/19/2022, 10:13 AM   Clinical Narrative:  Spoke to Dairl Ponder with [facility] who confirms they are able to accept pt today. IVC ppw faxed to BMU and message left with [facility] transport line [phone] requesting out of county transport. RN provided with number for report and pt's mother updated. SW signing off at dc.   Dellie Burns, MSW, LCSW [phone] (coverage)        Final next level of care: Home/Self Care Barriers to Discharge: No Barriers Identified   Patient Goals and CMS Choice Patient states their goals for this hospitalization and ongoing recovery are:: To get better CMS Medicare.gov Compare Post Acute Care list provided to:: Patient (Patient's mother present in the room) Choice offered to / list presented to : NA  Discharge Placement                       Discharge Plan and Services   Discharge Planning Services: CM Consult                                 Social Determinants of Health (SDOH) Interventions     Readmission Risk Interventions    01/09/2022    3:02 PM  Readmission Risk Prevention Plan  Transportation Screening Complete  PCP or Specialist Appt within 5-7 Days Complete  Home Care Screening Complete  Medication Review (RN CM) Complete

## 2022-01-19 NOTE — Progress Notes (Signed)
Patient ID: Jane Watkins, female   DOB: 1995-12-05, 26 y.o.   MRN: [id number]  D: Patient is newly admitted to BMU. Patient's skin assessment completed with Laretta Alstrom RN RN, skin is intact, no contraband found. Patient denies SI/HI/AVH. Patient reports Smoking.  A: Patient oriented to unit/room/call light. Patient was offered support and encouragement. Patient was encourage to attend groups, participate in unit activities and continue with plan of care. Q x 15 minute observation checks were completed for safety.   R: Patient has no complaints at this time. Patient is receptive to treatment and safety maintained on unit.

## 2022-01-19 NOTE — Plan of Care (Signed)
  Problem: Education: Goal: Ability to manage disease process will improve Outcome: Progressing   Problem: Cardiac: Goal: Ability to achieve and maintain adequate cardiopulmonary perfusion will improve Outcome: Progressing   Problem: Neurologic: Goal: Promote progressive neurologic recovery Outcome: Progressing   Problem: Skin Integrity: Goal: Risk for impaired skin integrity will be minimized. Outcome: Progressing   Problem: Education: Goal: Knowledge of General Education information will improve Description: Including pain rating scale, medication(s)/side effects and non-pharmacologic comfort measures Outcome: Progressing   Problem: Health Behavior/Discharge Planning: Goal: Ability to manage health-related needs will improve Outcome: Progressing   Problem: Clinical Measurements: Goal: Ability to maintain clinical measurements within normal limits will improve Outcome: Progressing Goal: Will remain free from infection Outcome: Progressing Goal: Diagnostic test results will improve Outcome: Progressing Goal: Respiratory complications will improve Outcome: Progressing Goal: Cardiovascular complication will be avoided Outcome: Progressing   Problem: Activity: Goal: Risk for activity intolerance will decrease Outcome: Progressing   Problem: Nutrition: Goal: Adequate nutrition will be maintained Outcome: Progressing   Problem: Coping: Goal: Level of anxiety will decrease Outcome: Progressing   Problem: Elimination: Goal: Will not experience complications related to bowel motility Outcome: Progressing Goal: Will not experience complications related to urinary retention Outcome: Progressing   Problem: Pain Managment: Goal: General experience of comfort will improve Outcome: Progressing   Problem: Safety: Goal: Ability to remain free from injury will improve Outcome: Progressing   Problem: Skin Integrity: Goal: Risk for impaired skin integrity will  decrease Outcome: Progressing   Problem: Safety: Goal: Non-violent Restraint(s) Outcome: Progressing   

## 2022-01-19 NOTE — Tx Team (Signed)
Initial Treatment Plan 01/19/2022 4:39 PM Jane Watkins [id number]:[id number]    PATIENT STRESSORS: Financial difficulties   Health problems   Substance abuse     PATIENT STRENGTHS: Ability for insight  Active sense of humor  Capable of independent living    PATIENT IDENTIFIED PROBLEMS: Ineffective Coping Skills  Unstable Mood                   DISCHARGE CRITERIA:  Ability to meet basic life and health needs Adequate post-discharge living arrangements  PRELIMINARY DISCHARGE PLAN: Return to previous living arrangement Return to previous work or school arrangements  PATIENT/FAMILY INVOLVEMENT: This treatment plan has been presented to and reviewed with the patient, Jane Watkins.  The patient and family have been given the opportunity to ask questions and make suggestions.  Berkley Harvey, RN 01/19/2022, 4:39 PM

## 2022-01-20 DIAGNOSIS — F332 Major depressive disorder, recurrent severe without psychotic features: Secondary | ICD-10-CM

## 2022-01-20 DIAGNOSIS — Z8673 Personal history of transient ischemic attack (TIA), and cerebral infarction without residual deficits: Secondary | ICD-10-CM

## 2022-01-20 LAB — HEPATITIS B SURFACE ANTIGEN: Hepatitis B Surface Ag: NONREACTIVE

## 2022-01-20 LAB — HEMOGLOBIN A1C
Hgb A1c MFr Bld: 5.4 % (ref 4.8–5.6)
Mean Plasma Glucose: 108 mg/dL

## 2022-01-20 LAB — HEPATITIS C ANTIBODY: HCV Ab: NONREACTIVE

## 2022-01-20 MED ORDER — ONDANSETRON 4 MG PO TBDP
4.0000 mg | ORAL_TABLET | Freq: Three times a day (TID) | ORAL | Status: DC | PRN
  Administered 2022-01-20: 4 mg via ORAL
  Filled 2022-01-20: qty 1

## 2022-01-20 MED ORDER — BUPRENORPHINE HCL-NALOXONE HCL 8-2 MG SL SUBL
1.0000 | SUBLINGUAL_TABLET | Freq: Every day | SUBLINGUAL | Status: DC
  Administered 2022-01-20 – 2022-01-21 (×2): 1 via SUBLINGUAL
  Filled 2022-01-20 (×2): qty 1

## 2022-01-20 MED ORDER — QUETIAPINE FUMARATE 25 MG PO TABS
50.0000 mg | ORAL_TABLET | Freq: Every day | ORAL | Status: DC
  Administered 2022-01-20 – 2022-01-27 (×8): 50 mg via ORAL
  Filled 2022-01-20 (×8): qty 2

## 2022-01-20 NOTE — Progress Notes (Signed)
Patient alert and oriented x 4, she denies pain no distress noted, she is noted interacting appropriately with peers and staff and complaint with medication regimen.  Patient was offered emotional support, 15 minutes safety checks maintained will continue to monitor.

## 2022-01-20 NOTE — BHH Group Notes (Signed)
BHH Group Notes:  (Nursing/MHT/Case Management/Adjunct)  Date:  01/20/2022  Time:  8:40 PM  Type of Therapy:   Wrap up  Participation Level:  Active  Participation Quality:  Appropriate  Affect:  Appropriate  Cognitive:  Alert  Insight:  Good  Engagement in Group:  Engaged and her goal was to talk to her Doctor and she did and she was talking low in sound.  Modes of Intervention:  Support  Summary of Progress/Problems:  Jane Watkins 01/20/2022, 8:40 PM

## 2022-01-20 NOTE — Progress Notes (Signed)
Patient calm and pleasant during assessment denying SI/HI/AVH. Patient observed interacting appropriately with staff and peers on the unit. Pt compliant with medication administration per MD orders. Pt given education, support, and encouragement to be active in her treatment plan. Pt being monitored Q 15 minutes for safety per unit protocol, remains safe on the unit  

## 2022-01-20 NOTE — Progress Notes (Signed)
Patient endorses 8/10 pain from a headache. Tylenol was given, with relief. Patient states that since she got the headache, she has also been feeling nauseous. MD informed. Patient observed in the dayroom eating dinner and using the phone. Patient is compliant with scheduled medications.
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
## 2022-01-20 NOTE — Group Note (Addendum)
[facility] LCSW Group Therapy Note    Group Date: 01/20/2022 Start Time: 1300 End Time: 1400  Type of Therapy and Topic:  Group Therapy:  Overcoming Obstacles  Participation Level:  BHH PARTICIPATION LEVEL: Minimal   Description of Group:   In this group patients will be encouraged to explore what they see as obstacles to their own wellness and recovery. They will be guided to discuss their thoughts, feelings, and behaviors related to these obstacles. The group will process together ways to cope with barriers, with attention given to specific choices patients can make. Each patient will be challenged to identify changes they are motivated to make in order to overcome their obstacles. This group will be process-oriented, with patients participating in exploration of their own experiences as well as giving and receiving support and challenge from other group members.  Therapeutic Goals: 1. Patient will identify personal and current obstacles as they relate to admission. 2. Patient will identify barriers that currently interfere with their wellness or overcoming obstacles.  3. Patient will identify feelings, thought process and behaviors related to these barriers. 4. Patient will identify two changes they are willing to make to overcome these obstacles:    Summary of Patient Progress Pt showed up for group. She stated that her day is going well. When asked if she had any questions, comments, or concerns, pt denied any. As pt was the sole attendee group was not held but she was allowed to voice any needs or concerns. Pt denied any. She appeared to accept fact that there would not be group without incident.    Therapeutic Modalities:   Cognitive Behavioral Therapy Solution Focused Therapy Motivational Interviewing Relapse Prevention Therapy   Glenis Smoker, LCSW

## 2022-01-20 NOTE — H&P (Signed)
Psychiatric Admission Assessment Adult  Patient Identification: Jane Watkins MRN:  [id number] Date of Evaluation:  01/20/2022 Chief Complaint:  Severe recurrent major depression without psychotic features (HCC) [F33.2] Principal Diagnosis: Severe recurrent major depression without psychotic features (HCC) Diagnosis:  Principal Problem:   Severe recurrent major depression without psychotic features (HCC) Active Problems:   Opiate abuse, continuous (HCC)   History of ischemic stroke  History of Present Illness: Patient seen and chart reviewed.  26 year old woman transferred to [place] from [place].  She had presented there initially on November 26 after an episode in which she was found in cardiac arrest.  Patient was reported to have been asystolic required CPR in the field later treated first in the intensive care unit and then on medical unit.  Etiology was overdose of fentanyl and cocaine.  Patient had apparently told some staff people that this was being done recreationally but now says that it was actually a suicide attempt.  Told me today that she was trying to kill her self.  She has been feeling depressed for months.  Feels hopeless and negative about herself.  Patient was upset in part because she had relapsed into drug abuse very shortly after getting out of a rehab facility.  Also has been having some trouble in her relationships with her family of origin.  Patient was seen a provider for treatment of depression prior to this episode.  Had been on antidepressant medications which were in the process of being changed. Associated Signs/Symptoms: Depression Symptoms:  depressed mood, anhedonia, insomnia, feelings of worthlessness/guilt, difficulty concentrating, hopelessness, suicidal attempt, (Hypo) Manic Symptoms:  Impulsivity, Anxiety Symptoms:  Excessive Worry, Psychotic Symptoms:   None reported PTSD Symptoms: Multiple symptoms of anxiety and depression that could be PTSD but  full history not explored Total Time spent with patient: 45 minutes  Past Psychiatric History: Patient has a extensive history of substance use problems.  States that she has struggled with opiate abuse for about 7 years.  Has had some success with sobriety and has been in multiple rehabs but often relapses pretty quickly.  Patient has a history of depression and was seeing a provider as an outpatient.  Reports that she has been on Pristiq Remeron and Zoloft in the past without significant improvement.  Also fluoxetine.  Reports that she had been diagnosed as having bipolar disorder more recently.  She tells me that she has had manic spells in the past but without psychotic symptoms.  She denies any prior suicide attempts  Is the patient at risk to self? Yes.    Has the patient been a risk to self in the past 6 months? Yes.    Has the patient been a risk to self within the distant past? No.  Is the patient a risk to others? No.  Has the patient been a risk to others in the past 6 months? No.  Has the patient been a risk to others within the distant past? No.   [place] Scale:  Flowsheet Row Admission (Current) from 01/19/2022 in [facility] INPATIENT BEHAV[facility] MEDICINE ED to Hosp-Admission (Discharged) from 01/05/2022 in [facility] ED from 04/29/2021 in [facility] EMERGENCY DEPARTMENT  C-SSRS RISK CATEGORY No Risk No Risk No Risk        Prior Inpatient Therapy: Yes.   If yes, describe has had rehab such as at [facility] Prior Outpatient Therapy: Yes.   If yes, describe has had therapy and medication management for  depression  Alcohol Screening: 1. How often do you have a drink containing alcohol?: Never 2. How many drinks containing alcohol do you have on a typical day when you are drinking?: 1 or 2 3. How often do you have six or more drinks on one occasion?: Never AUDIT-C Score: 0 4. How often during the last year have you found that you  were not able to stop drinking once you had started?: Never 5. How often during the last year have you failed to do what was normally expected from you because of drinking?: Never 6. How often during the last year have you needed a first drink in the morning to get yourself going after a heavy drinking session?: Never 7. How often during the last year have you had a feeling of guilt of remorse after drinking?: Never 8. How often during the last year have you been unable to remember what happened the night before because you had been drinking?: Never 9. Have you or someone else been injured as a result of your drinking?: No 10. Has a relative or friend or a doctor or another health worker been concerned about your drinking or suggested you cut down?: No Alcohol Use Disorder Identification Test Final Score (AUDIT): 0 Alcohol Brief Interventions/Follow-up: Alcohol education/Brief advice Substance Abuse History in the last 12 months:  Yes.   Consequences of Substance Abuse: Chronic estrangement from part of her family, depression and anxiety, recent suicide attempt Previous Psychotropic Medications: Yes  Psychological Evaluations: Yes  Past Medical History:  Past Medical History:  Diagnosis Date   AKI (acute kidney injury) (HCC) 2018   "from overdose"   Anxiety    Chlamydia    Daily headache    Depression    Drug overdose 04/2016   [name] 05/01/2016   GERD (gastroesophageal reflux disease)    "when I was younger; gone now" (09/21/2017)   IV drug abuse (HCC) 09/20/2017   Migraine    "q couple weeks" (09/21/2017)   Opioid abuse (HCC)    Overdose    Stroke [facility])     Past Surgical History:  Procedure Laterality Date   APPENDECTOMY  04/2013   I & D EXTREMITY Left 09/20/2017   Procedure: IRRIGATION AND DEBRIDEMENT LEFT ARM;  Surgeon: Bradly Bienenstock, MD;  Location: [facility] OR;  Service: Orthopedics;  Laterality: Left;   TEE WITHOUT CARDIOVERSION N/A 02/10/2019   Procedure: TRANSESOPHAGEAL  ECHOCARDIOGRAM (TEE);  Surgeon: Debbe Odea, MD;  Location: [facility] ORS;  Service: Cardiovascular;  Laterality: N/A;  Polysubstance abuser   TONGUE SURGERY  ~ 2007   "related to lisp"   TRACHEOSTOMY  04/2016   [name] 05/01/2016   Family History:  Family History  Problem Relation Age of Onset   Anemia Father    Healthy Sister    Thyroid cancer Maternal Grandmother    Lung cancer Maternal Grandfather        metastasized   Lung cancer Paternal Grandfather    Family Psychiatric  History: Patient reports an extensive family history of mood disorders with bipolar disorder and several close members of her family as well as depression Tobacco Screening:  Social History   Tobacco Use  Smoking Status Every Day   Packs/day: 0.50   Years: 10.00   Total pack years: 5.00   Types: Cigarettes  Smokeless Tobacco Never    BH Tobacco Counseling     Are you interested in Tobacco Cessation Medications?  No value filed. Counseled patient on smoking cessation:  No value filed.  Reason Tobacco Screening Not Completed: No value filed.       Social History:  Social History   Substance and Sexual Activity  Alcohol Use No     Social History   Substance and Sexual Activity  Drug Use Yes   Types: IV, Heroin, Cocaine, Marijuana   Comment: last use of heroin and cocaine was 01/2019, marijuana daily    Additional Social History: Marital status: Widowed Widowed, when?: 2021 Does patient have children?: No                         Allergies:   Allergies  Allergen Reactions   Pristiq [Desvenlafaxine Succinate Er] Rash   Desvenlafaxine Rash   Other Rash    Tide detergent  Tide detergent  Tide detergent    Sulfa Antibiotics Rash   Lab Results: No results found for this or any previous visit (from the past 48 hour(s)).  Blood Alcohol level:  Lab Results  Component Value Date   ETH <10 04/29/2021   ETH <10 02/07/2019    Metabolic Disorder Labs:  Lab Results  Component  Value Date   HGBA1C 5.1 11/05/2020   MPG 96.8 02/07/2019   No results found for: "PROLACTIN" Lab Results  Component Value Date   CHOL 127 11/05/2020   TRIG 32 01/05/2022   HDL 44.10 11/05/2020   CHOLHDL 3 11/05/2020   VLDL 15.8 11/05/2020   LDLCALC 67 11/05/2020   LDLCALC 35 02/08/2019    Current Medications: Current Facility-Administered Medications  Medication Dose Route Frequency Provider Last Rate Last Admin   acetaminophen (TYLENOL) tablet 650 mg  650 mg Oral Q6H PRN Tanae Petrosky T, MD       alum & mag hydroxide-simeth (MAALOX/MYLANTA) 200-200-20 MG/5ML suspension 30 mL  30 mL Oral Q4H PRN Jahna Liebert T, MD       baclofen (LIORESAL) tablet 10 mg  10 mg Oral TID Elridge Stemm T, MD   10 mg at 01/20/22 1229   buprenorphine-naloxone (SUBOXONE) 8-2 mg per SL tablet 1 tablet  1 tablet Sublingual Daily Vann Okerlund, Jackquline DenmarkJohn T, MD   1 tablet at 01/20/22 1229   busPIRone (BUSPAR) tablet 10 mg  10 mg Oral TID Liel Rudden T, MD   10 mg at 01/20/22 1229   docusate sodium (COLACE) capsule 100 mg  100 mg Oral BID Laprecious Austill T, MD   100 mg at 01/20/22 0825   gabapentin (NEURONTIN) capsule 300 mg  300 mg Oral QHS Lear Carstens T, MD   300 mg at 01/19/22 2251   lamoTRIgine (LAMICTAL) tablet 25 mg  25 mg Oral BID Ahlayah Tarkowski T, MD   25 mg at 01/20/22 [id number]   magnesium hydroxide (MILK OF MAGNESIA) suspension 30 mL  30 mL Oral Daily PRN Brycen Bean, Jackquline DenmarkJohn T, MD       menthol-cetylpyridinium (CEPACOL) lozenge 3 mg  1 lozenge Oral PRN Tao Satz, Jackquline DenmarkJohn T, MD       nicotine (NICODERM CQ - dosed in mg/24 hours) patch 14 mg  14 mg Transdermal Daily Cyler Kappes T, MD   14 mg at 01/20/22 0826   ondansetron (ZOFRAN) injection 4 mg  4 mg Intravenous Q6H PRN Jsaon Yoo T, MD       OXcarbazepine (TRILEPTAL) tablet 150 mg  150 mg Oral BID Danna Sewell T, MD   150 mg at 01/20/22 0827   polyethylene glycol (MIRALAX / GLYCOLAX) packet 17 g  17 g Oral Daily Tecla Mailloux, Jackquline DenmarkJohn T, MD  QUEtiapine (SEROQUEL) tablet 50  mg  50 mg Oral QHS Elektra Wartman, [name], MD       PTA Medications: Medications Prior to Admission  Medication Sig Dispense Refill Last Dose   acetaminophen (TYLENOL) 325 MG tablet Take 2 tablets (650 mg total) by mouth every 4 (four) hours as needed for mild pain (or temp > 37.5 C (99.5 F)).      baclofen (LIORESAL) 10 MG tablet Take 1 tablet (10 mg total) by mouth 3 (three) times daily. 90 tablet 1    busPIRone (BUSPAR) 10 MG tablet TAKE 1 TABLET BY MOUTH THREE TIMES A DAY (Patient taking differently: Take 10 mg by mouth 3 (three) times daily.) 270 tablet 2    gabapentin (NEURONTIN) 300 MG capsule Take 1 capsule (300 mg total) by mouth at bedtime. 90 capsule 3    lamoTRIgine (LAMICTAL) 25 MG tablet Take 1 tablet (25 mg total) by mouth 2 (two) times daily.      mirtazapine (REMERON) 15 MG tablet Take 15 mg by mouth at bedtime.      naloxone (NARCAN) nasal spray 4 mg/0.1 mL ADMINISTER A SINGLE SPRAY IN ONE NOSTRIL UPON SIGNS OF OPIOID OVERDOSE. REPEAT EVERY 2 - 3 MINUTES IF NO RESPONSE IN ALTERNATING NOSTRIL TIL EMS ARRIVES.      nicotine (NICODERM CQ - DOSED IN MG/24 HOURS) 21 mg/24hr patch Place 1 patch (21 mg total) onto the skin daily. 28 patch 3    OXcarbazepine (TRILEPTAL) 150 MG tablet Take 150 mg by mouth 2 (two) times daily.      QUEtiapine (SEROQUEL) 50 MG tablet Take 1 tablet (50 mg total) by mouth at bedtime. 60 tablet 3    SUMAtriptan (IMITREX) 50 MG tablet Take 1 tablet by mouth every 2 hours as needed for migraine. May repeat in 2 hours if headache persists or recurs. 10 tablet 0     Musculoskeletal: Strength & Muscle Tone: within normal limits Gait & Station:  Patient suffered a right-sided stroke sometime ago and now has some weakness on the left side including a limp Patient leans: Left            Psychiatric Specialty Exam:  Presentation  General Appearance:  Appropriate for Environment  Eye Contact: Good  Speech: Clear and Coherent; Normal Rate  Speech  Volume: Normal  Handedness: Right   Mood and Affect  Mood: Euthymic  Affect: Appropriate; Congruent   Thought Process  Thought Processes: Coherent; Goal Directed  Duration of Psychotic Symptoms: 6 months or more Past Diagnosis of Schizophrenia or Psychoactive disorder: No data recorded Descriptions of Associations:Intact  Orientation:Full (Time, Place and Person)  Thought Content:No data recorded Hallucinations:No data recorded Ideas of Reference:None  Suicidal Thoughts:No data recorded Homicidal Thoughts:No data recorded  Sensorium  Memory: Immediate Good; Recent Good  Judgment: Intact  Insight: Present   Executive Functions  Concentration: Good  Attention Span: Good  Recall: Good  Fund of Knowledge: Good  Language: Good   Psychomotor Activity  Psychomotor Activity:No data recorded  Assets  Assets: Communication Skills; Desire for Improvement; Housing; Health and safety inspector; Social Support; Physical Health   Sleep  Sleep:No data recorded   Physical Exam: Physical Exam Vitals reviewed.  Constitutional:      Appearance: Normal appearance.  HENT:     Head: Normocephalic and atraumatic.     Mouth/Throat:     Pharynx: Oropharynx is clear.  Eyes:     Pupils: Pupils are equal, round, and reactive to light.  Cardiovascular:  Rate and Rhythm: Normal rate and regular rhythm.  Pulmonary:     Effort: Pulmonary effort is normal.     Breath sounds: Normal breath sounds.  Abdominal:     General: Abdomen is flat.     Palpations: Abdomen is soft.  Musculoskeletal:        General: Normal range of motion.  Skin:    General: Skin is warm and dry.  Neurological:     General: No focal deficit present.     Mental Status: She is alert. Mental status is at baseline.  Psychiatric:        Attention and Perception: Attention normal.        Mood and Affect: Mood is depressed. Affect is blunt.        Speech: Speech is delayed.         Behavior: Behavior is cooperative.        Thought Content: Thought content normal.    Review of Systems  Constitutional: Negative.   HENT: Negative.    Eyes: Negative.   Respiratory: Negative.    Cardiovascular: Negative.   Gastrointestinal: Negative.   Musculoskeletal: Negative.   Skin: Negative.   Neurological:  Positive for weakness.  Psychiatric/Behavioral:  Positive for depression and substance abuse.    Blood pressure 116/68, pulse 73, temperature 98.4 F (36.9 C), temperature source Oral, resp. rate 18, height 5\' 2"  (1.575 m), weight 58.5 kg, last menstrual period 12/24/2021, SpO2 100 %. Body mass index is 23.59 kg/m.  Treatment Plan Summary: Medication management and Plan work with patient on clarifying recent history and diagnosis.  Review medication.  We are going to try to focus on medicines for treating bipolar depression such as what she is currently on with the Trileptal and lamotrigine.  Get rid of antidepressants use modest dose Seroquel as needed for sleep.  Engage in individual and group therapy daily.  Patient is stating she would like to go back to substance abuse rehab.  Also I have suggested to her that we go ahead and start modest dose Suboxone in anticipation of her long-term needs for substance use treatment  Observation Level/Precautions:  15 minute checks  Laboratory:  Chemistry Profile  Psychotherapy:    Medications:    Consultations:    Discharge Concerns:    Estimated LOS:  Other:     Physician Treatment Plan for Primary Diagnosis: Severe recurrent major depression without psychotic features (HCC) Long Term Goal(s): Improvement in symptoms so as ready for discharge  Short Term Goals: Ability to verbalize feelings will improve, Ability to disclose and discuss suicidal ideas, and Ability to demonstrate self-control will improve  Physician Treatment Plan for Secondary Diagnosis: Principal Problem:   Severe recurrent major depression without psychotic  features (HCC) Active Problems:   Opiate abuse, continuous (HCC)   History of ischemic stroke  Long Term Goal(s): Improvement in symptoms so as ready for discharge  Short Term Goals: Compliance with prescribed medications will improve and Ability to identify triggers associated with substance abuse/mental health issues will improve  I certify that inpatient services furnished can reasonably be expected to improve the patient's condition.    12/26/2021, MD 12/11/20232:17 PM

## 2022-01-20 NOTE — [facility] Suicide Risk Assessment (Signed)
[facility] Admission Suicide Risk Assessment   Nursing information obtained from:  Patient Demographic factors:  NA Current Mental Status:  NA Loss Factors:  Legal issues Historical Factors:  Prior suicide attempts, Impulsivity Risk Reduction Factors:  Positive social support  Total Time spent with patient: 45 minutes Principal Problem: Severe recurrent major depression without psychotic features ([place]) Diagnosis:  Principal Problem:   Severe recurrent major depression without psychotic features ([place]) Active Problems:   Opiate abuse, continuous ([place])   History of ischemic stroke  Subjective Data: Patient seen and chart reviewed.  26 year old woman transferred to [place] from [place].  She initially presented to the hospital there after a suicide attempt by overdose of fentanyl and cocaine.  It resulted in cardiac arrest which required extensive CPR followed by much treatment in the intensive care unit.  Now medically stable.  Patient admits that she had been trying to kill her self.  States that she continues to be very anxious and depressed and negative but does not actually want to die anymore.  Able to cooperate with appropriate treatment planning  Continued Clinical Symptoms:  Alcohol Use Disorder Identification Test Final Score (AUDIT): 0 The "Alcohol Use Disorders Identification Test", Guidelines for Use in Primary Care, Second Edition.  World Pharmacologist [facility]). Score between 0-7:  no or low risk or alcohol related problems. Score between 8-15:  moderate risk of alcohol related problems. Score between 16-19:  high risk of alcohol related problems. Score 20 or above:  warrants further diagnostic evaluation for alcohol dependence and treatment.   CLINICAL FACTORS:   Depression:   Comorbid alcohol abuse/dependence Hopelessness Impulsivity Alcohol/Substance Abuse/Dependencies   Musculoskeletal: Strength & Muscle Tone: within normal limits Gait & Station:  Patient has a limp with  weakness on the left side due to her stroke Patient leans: Left  Psychiatric Specialty Exam:  Presentation  General Appearance:  Appropriate for Environment  Eye Contact: Good  Speech: Clear and Coherent; Normal Rate  Speech Volume: Normal  Handedness: Right   Mood and Affect  Mood: Euthymic  Affect: Appropriate; Congruent   Thought Process  Thought Processes: Coherent; Goal Directed  Descriptions of Associations:Intact  Orientation:Full (Time, Place and Person)  Thought Content:No data recorded History of Schizophrenia/Schizoaffective disorder:No data recorded Duration of Psychotic Symptoms:No data recorded Hallucinations:No data recorded Ideas of Reference:None  Suicidal Thoughts:No data recorded Homicidal Thoughts:No data recorded  Sensorium  Memory: Immediate Good; Recent Good  Judgment: Intact  Insight: Present   Executive Functions  Concentration: Good  Attention Span: Good  Recall: Good  Fund of Knowledge: Good  Language: Good   Psychomotor Activity  Psychomotor Activity:No data recorded  Assets  Assets: Communication Skills; Desire for Improvement; Housing; Catering manager; Social Support; Physical Health   Sleep  Sleep:No data recorded   Physical Exam: Physical Exam Vitals and nursing note reviewed.  Constitutional:      Appearance: Normal appearance.  HENT:     Head: Normocephalic and atraumatic.     Mouth/Throat:     Pharynx: Oropharynx is clear.  Eyes:     Pupils: Pupils are equal, round, and reactive to light.  Cardiovascular:     Rate and Rhythm: Normal rate and regular rhythm.  Pulmonary:     Effort: Pulmonary effort is normal.     Breath sounds: Normal breath sounds.  Abdominal:     General: Abdomen is flat.     Palpations: Abdomen is soft.  Musculoskeletal:        General: Normal range of motion.  Skin:    General: Skin is warm and dry.  Neurological:     General: No focal  deficit present.     Mental Status: She is alert. Mental status is at baseline.  Psychiatric:        Attention and Perception: Attention normal.        Mood and Affect: Mood is anxious. Affect is blunt.        Speech: Speech is delayed.        Behavior: Behavior is slowed.        Thought Content: Thought content normal.        Cognition and Memory: Cognition normal.    Review of Systems  Constitutional: Negative.   HENT: Negative.    Eyes: Negative.   Respiratory: Negative.    Cardiovascular: Negative.   Gastrointestinal: Negative.   Musculoskeletal: Negative.   Skin: Negative.   Neurological:  Positive for weakness.  Psychiatric/Behavioral:  Positive for depression. The patient is nervous/anxious.    Blood pressure 116/68, pulse 73, temperature 98.4 F (36.9 C), temperature source Oral, resp. rate 18, height _0  (1.575 m), weight 58.5 kg, last menstrual period 12/24/2021, SpO2 100 %. Body mass index is 23.59 kg/m.   COGNITIVE FEATURES THAT CONTRIBUTE TO RISK:  None    SUICIDE RISK:   Mild:  Suicidal ideation of limited frequency, intensity, duration, and specificity.  There are no identifiable plans, no associated intent, mild dysphoria and related symptoms, good self-control (both objective and subjective assessment), few other risk factors, and identifiable protective factors, including available and accessible social support.  PLAN OF CARE: Continue 15-minute checks.  Engage patient in discussion about appropriate treatment for multiple sets of problems.  Full treatment team met with her this morning.  Daily reassessment of dangerousness prior to any discharge planning  I certify that inpatient services furnished can reasonably be expected to improve the patient's condition.   Alethia Berthold, MD 01/20/2022, 2:14 PM

## 2022-01-20 NOTE — Progress Notes (Signed)
[facility] LCSW Progress Note   01/20/2022    2:44 PM  Jane Watkins   [id number]   Type of Contact and Topic:    CSW met with patient to discuss substance use treatment options. Patient was provided w/ contact information for treatment centers to begin calling. Patient to complete treatment screeners with facilities as CSW provides prospective facilities with supporting documentation.   Situation ongoing, CSW will continue to monitor and update note as more information becomes available.    Signed:  Durenda Hurt, MSW, LCSWA, LCAS 01/20/2022 2:44 PM

## 2022-01-20 NOTE — Progress Notes (Signed)
Recreation Therapy Notes    Date: 01/20/2022   Time: 10:15 am      Location: Craft room    Behavioral response: N/A   Intervention Topic: Strengths    Discussion/Intervention: Patient refused to attend group.    Clinical Observations/Feedback:  Patient refused to attend group.    Alden Feagan LRT/CTRS          Quinlee Sciarra 01/20/2022 11:39 AM

## 2022-01-20 NOTE — Plan of Care (Signed)
D- Patient alert and oriented. Patient alert and oriented. Patient denies SI, HI, AVH, and pain. Quotes. Patient attended treatment team. Patient states that her goal is to learn how to manage her medication correctly and to get help for substance abuse.   A- Scheduled medications administered to patient, per MD orders. Support and encouragement provided.  Routine safety checks conducted every 15 minutes.  Patient informed to notify staff with problems or concerns.  R- No adverse drug reactions noted. Patient contracts for safety at this time. Patient compliant with medications and treatment plan. Patient receptive, calm, and cooperative. Patient interacts well with others on the unit.  Patient remains safe at this time.  Problem: Nutrition: Goal: Adequate nutrition will be maintained Outcome: Progressing   Problem: Elimination: Goal: Will not experience complications related to bowel motility Outcome: Progressing Goal: Will not experience complications related to urinary retention Outcome: Progressing   Problem: Skin Integrity: Goal: Risk for impaired skin integrity will decrease Outcome: Progressing   Problem: Cardiac: Goal: Ability to achieve and maintain adequate cardiopulmonary perfusion will improve Outcome: Progressing   Problem: Skin Integrity: Goal: Risk for impaired skin integrity will be minimized. Outcome: Progressing

## 2022-01-20 NOTE — BH IP Treatment Plan (Signed)
Interdisciplinary Treatment and Diagnostic Plan Update  01/20/2022 Time of Session: 9:44AM Lynden Carrithers MRN: [id number]  Principal Diagnosis: Severe recurrent major depression without psychotic features [facility])  Secondary Diagnoses: Principal Problem:   Severe recurrent major depression without psychotic features ([place])   Current Medications:  Current Facility-Administered Medications  Medication Dose Route Frequency Provider Last Rate Last Admin   acetaminophen (TYLENOL) tablet 650 mg  650 mg Oral Q6H PRN Clapacs, Madie Reno, MD       alum & mag hydroxide-simeth (MAALOX/MYLANTA) 200-200-20 MG/5ML suspension 30 mL  30 mL Oral Q4H PRN Clapacs, Madie Reno, MD       baclofen (LIORESAL) tablet 10 mg  10 mg Oral TID Clapacs, Madie Reno, MD   10 mg at 01/20/22 1229   buprenorphine-naloxone (SUBOXONE) 8-2 mg per SL tablet 1 tablet  1 tablet Sublingual Daily Clapacs, Madie Reno, MD   1 tablet at 01/20/22 1229   busPIRone (BUSPAR) tablet 10 mg  10 mg Oral TID Clapacs, Madie Reno, MD   10 mg at 01/20/22 1229   docusate sodium (COLACE) capsule 100 mg  100 mg Oral BID Clapacs, Madie Reno, MD   100 mg at 01/20/22 0825   gabapentin (NEURONTIN) capsule 300 mg  300 mg Oral QHS Clapacs, [name], MD   300 mg at 01/19/22 2251   lamoTRIgine (LAMICTAL) tablet 25 mg  25 mg Oral BID Clapacs, [name], MD   25 mg at 01/20/22 6967   magnesium hydroxide (MILK OF MAGNESIA) suspension 30 mL  30 mL Oral Daily PRN Clapacs, Madie Reno, MD       menthol-cetylpyridinium (CEPACOL) lozenge 3 mg  1 lozenge Oral PRN Clapacs, Madie Reno, MD       nicotine (NICODERM CQ - dosed in mg/24 hours) patch 14 mg  14 mg Transdermal Daily Clapacs, Madie Reno, MD   14 mg at 01/20/22 0826   ondansetron (ZOFRAN) injection 4 mg  4 mg Intravenous Q6H PRN Clapacs, Madie Reno, MD       OXcarbazepine (TRILEPTAL) tablet 150 mg  150 mg Oral BID Clapacs, [name], MD   150 mg at 01/20/22 0827   polyethylene glycol (MIRALAX / GLYCOLAX) packet 17 g  17 g Oral Daily Clapacs, [name], MD        QUEtiapine (SEROQUEL) tablet 50 mg  50 mg Oral QHS Clapacs, [name], MD       PTA Medications: Medications Prior to Admission  Medication Sig Dispense Refill Last Dose   acetaminophen (TYLENOL) 325 MG tablet Take 2 tablets (650 mg total) by mouth every 4 (four) hours as needed for mild pain (or temp > 37.5 C (99.5 F)).      baclofen (LIORESAL) 10 MG tablet Take 1 tablet (10 mg total) by mouth 3 (three) times daily. 90 tablet 1    busPIRone (BUSPAR) 10 MG tablet TAKE 1 TABLET BY MOUTH THREE TIMES A DAY (Patient taking differently: Take 10 mg by mouth 3 (three) times daily.) 270 tablet 2    gabapentin (NEURONTIN) 300 MG capsule Take 1 capsule (300 mg total) by mouth at bedtime. 90 capsule 3    lamoTRIgine (LAMICTAL) 25 MG tablet Take 1 tablet (25 mg total) by mouth 2 (two) times daily.      mirtazapine (REMERON) 15 MG tablet Take 15 mg by mouth at bedtime.      naloxone (NARCAN) nasal spray 4 mg/0.1 mL ADMINISTER A SINGLE SPRAY IN ONE NOSTRIL UPON SIGNS OF OPIOID OVERDOSE. REPEAT EVERY 2 -  3 MINUTES IF NO RESPONSE IN ALTERNATING NOSTRIL TIL EMS ARRIVES.      nicotine (NICODERM CQ - DOSED IN MG/24 HOURS) 21 mg/24hr patch Place 1 patch (21 mg total) onto the skin daily. 28 patch 3    OXcarbazepine (TRILEPTAL) 150 MG tablet Take 150 mg by mouth 2 (two) times daily.      QUEtiapine (SEROQUEL) 50 MG tablet Take 1 tablet (50 mg total) by mouth at bedtime. 60 tablet 3    SUMAtriptan (IMITREX) 50 MG tablet Take 1 tablet by mouth every 2 hours as needed for migraine. May repeat in 2 hours if headache persists or recurs. 10 tablet 0     Patient Stressors: Financial difficulties   Health problems   Substance abuse    Patient Strengths: Ability for insight  Active sense of humor  Capable of independent living   Treatment Modalities: Medication Management, Group therapy, Case management,  1 to 1 session with clinician, Psychoeducation, Recreational therapy.   Physician Treatment Plan for Primary  Diagnosis: Severe recurrent major depression without psychotic features ([place]) Long Term Goal(s):     Short Term Goals:    Medication Management: Evaluate patient's response, side effects, and tolerance of medication regimen.  Therapeutic Interventions: 1 to 1 sessions, Unit Group sessions and Medication administration.  Evaluation of Outcomes: Not Met  Physician Treatment Plan for Secondary Diagnosis: Principal Problem:   Severe recurrent major depression without psychotic features ([place])  Long Term Goal(s):     Short Term Goals:       Medication Management: Evaluate patient's response, side effects, and tolerance of medication regimen.  Therapeutic Interventions: 1 to 1 sessions, Unit Group sessions and Medication administration.  Evaluation of Outcomes: Not Met   RN Treatment Plan for Primary Diagnosis: Severe recurrent major depression without psychotic features ([place]) Long Term Goal(s): Knowledge of disease and therapeutic regimen to maintain health will improve  Short Term Goals: Ability to demonstrate self-control, Ability to participate in decision making will improve, Ability to verbalize feelings will improve, Ability to disclose and discuss suicidal ideas, Ability to identify and develop effective coping behaviors will improve, and Compliance with prescribed medications will improve  Medication Management: RN will administer medications as ordered by provider, will assess and evaluate patient's response and provide education to patient for prescribed medication. RN will report any adverse and/or side effects to prescribing provider.  Therapeutic Interventions: 1 on 1 counseling sessions, Psychoeducation, Medication administration, Evaluate responses to treatment, Monitor vital signs and CBGs as ordered, Perform/monitor CIWA, COWS, AIMS and Fall Risk screenings as ordered, Perform wound care treatments as ordered.  Evaluation of Outcomes: Not Met   LCSW Treatment Plan for  Primary Diagnosis: Severe recurrent major depression without psychotic features ([place]) Long Term Goal(s): Safe transition to appropriate next level of care at discharge, Engage patient in therapeutic group addressing interpersonal concerns.  Short Term Goals: Engage patient in aftercare planning with referrals and resources, Increase social support, Increase ability to appropriately verbalize feelings, Increase emotional regulation, Facilitate acceptance of mental health diagnosis and concerns, Facilitate patient progression through stages of change regarding substance use diagnoses and concerns, Identify triggers associated with mental health/substance abuse issues, and Increase skills for wellness and recovery  Therapeutic Interventions: Assess for all discharge needs, 1 to 1 time with Social worker, Explore available resources and support systems, Assess for adequacy in community support network, Educate family and significant other(s) on suicide prevention, Complete Psychosocial Assessment, Interpersonal group therapy.  Evaluation of Outcomes: Not Met  Progress in Treatment: Attending groups: Yes. Participating in groups: Yes. Taking medication as prescribed: Yes. Toleration medication: Yes. Family/Significant other contact made: No, will contact:  once permission is given. Patient understands diagnosis: Yes. Discussing patient identified problems/goals with staff: Yes. Medical problems stabilized or resolved: Yes. Denies suicidal/homicidal ideation: Yes. Issues/concerns per patient self-inventory: No. Other: none  New problem(s) identified: No, Describe:  none  New Short Term/Long Term Goal(s):  detox, medication management for mood stabilization; elimination of SI thoughts; development of comprehensive mental wellness/sobriety plan.   Patient Goals:  "medication management and maybe find somewhere else to fo for additional treatment"  Discharge Plan or Barriers: CSW to assist  patient in development of appropriate discharge plans.   Reason for Continuation of Hospitalization: Anxiety Depression Medical Issues Medication stabilization Suicidal ideation  Estimated Length of Stay:  1-7 days  Last 3 [place] Suicide Severity Risk Score: [place] Admission (Current) from 01/19/2022 in [place] ED to Hosp-Admission (Discharged) from 01/05/2022 in [place] [facility] ED from 04/29/2021 in [place] No Risk No Risk No Risk       Last PHQ 2/9 Scores:    10/11/2021   11:14 AM 02/08/2020    2:50 PM 12/09/2019    1:12 PM  Depression screen PHQ 2/9  Decreased Interest _0 Down, Depressed, Hopeless 1 3   PHQ - 2 Score _1 Scribe for Treatment Team: Rozann Lesches, LCSW 01/20/2022 1:58 PM

## 2022-01-20 NOTE — Progress Notes (Signed)
Pt complaining of nausea, PRN medication given

## 2022-01-21 DIAGNOSIS — F332 Major depressive disorder, recurrent severe without psychotic features: Secondary | ICD-10-CM | POA: Diagnosis not present

## 2022-01-21 LAB — LIPID PANEL
Cholesterol: 192 mg/dL (ref 0–200)
HDL: 56 mg/dL (ref >40)
LDL Cholesterol: 118 mg/dL — ABNORMAL HIGH (ref 0–99)
Total CHOL/HDL Ratio: 3.4 RATIO
Triglycerides: 89 mg/dL (ref <150)
VLDL: 18 mg/dL (ref 0–40)

## 2022-01-21 LAB — HIV-1 RNA QUANT-NO REFLEX-BLD
HIV 1 RNA Quant: <20 copies/mL
LOG10 HIV-1 RNA: UNDETERMINED log10copy/mL

## 2022-01-21 MED ORDER — HYDROXYZINE HCL 50 MG PO TABS
50.0000 mg | ORAL_TABLET | Freq: Four times a day (QID) | ORAL | Status: DC | PRN
  Administered 2022-01-21 – 2022-01-27 (×5): 50 mg via ORAL
  Filled 2022-01-21 (×5): qty 1

## 2022-01-21 NOTE — Progress Notes (Signed)
D- Patient alert and oriented x 4 . Affect flat/mood sad. Denies SI/ HI/AVH. She complains of right sided head pain as well chest/rib pain- 4/10. Tylenol administered- effective. She states her goal today is to "practice patience and self compassion". A- Scheduled medications administered to patient, per MD orders. Support and encouragement provided.  Routine safety checks conducted every 15 minutes without incident.  Patient informed to notify staff with problems or concerns. R- No adverse drug reactions noted. Patient verbalizes that she understands to notify staff with any problems or concerns. She is compliant with medications and treatment plan. Patient receptive, calm, and cooperative- she speaks with a whisper soft voice. She interacts well with others on the unit and has remained out of room most of the shift.  She contracts for safety and remains safe at this time on the unit.

## 2022-01-21 NOTE — Group Note (Signed)
LCSW Group Therapy Note   Group Date: 01/21/2022 Start Time: 1300 End Time: 1400   Type of Therapy and Topic:  Group Therapy: Challenging Core Beliefs  Participation Level:  Active  Description of Group:  Patients were educated about core beliefs and asked to identify one harmful core belief that they have. Patients were asked to explore from where those beliefs originate. Patients were asked to discuss how those beliefs make them feel and the resulting behaviors of those beliefs. They were then be asked if those beliefs are true and, if so, what evidence they have to support them. Lastly, group members were challenged to replace those negative core beliefs with helpful beliefs.   Therapeutic Goals:   1. Patient will identify harmful core beliefs and explore the origins of such beliefs. 2. Patient will identify feelings and behaviors that result from those core beliefs. 3. Patient will discuss whether such beliefs are true. 4.  Patient will replace harmful core beliefs with helpful ones.  Summary of Patient Progress:   Patient was present for the entirety of the group session. Patient was an active listener and participated in the topic of discussion, provided helpful advice to others, and added nuance to topic of conversation. Patient states her cognitive distortions are ineffective defense mechanisms.   Therapeutic Modalities: Cognitive Behavioral Therapy; Solution-Focused Therapy   Jane Watkins 01/21/2022  2:33 PM

## 2022-01-21 NOTE — Progress Notes (Signed)
Recreation Therapy Notes  Date: 01/21/2022  Time: 10:00 am    Location: Craft room     Behavioral response: N/A   Intervention Topic: Self-esteem   Discussion/Intervention: Patient refused to attend group.   Clinical Observations/Feedback:  Patient refused to attend group.    Eldana Isip LRT/CTRS        Yvonnie Schinke 01/21/2022 11:54 AM

## 2022-01-21 NOTE — Plan of Care (Signed)
see care plan

## 2022-01-21 NOTE — [facility] Suicide Risk Assessment (Addendum)
[facility]/BMU LCSW Progress Note   01/21/2022    11:47 AM  Jane Watkins   [id number]   Type of Contact and Topic: Care Coordination   CSW accompanied patient in calling [facility] treatment facilities. CSW has faxed referral (H+P and labs) to [facility]. Patient's insurance was not accepted at [facility], though CSW is currently working with Almyra Free, admissions coordinator, to determine if scholarships are available for the program.   Situation ongoing, CSW will continue to monitor and update note as more information becomes available.    Signed:  Corky Crafts, MSW, LCSWA, LCAS 01/21/2022 11:47 AM

## 2022-01-21 NOTE — Progress Notes (Signed)
[facility]U LCSW Progress Note   01/21/2022    9:38 AM  Jane Watkins   [id number]   Type of Contact and Topic:    CSW met with patient to discuss progress with search for substance use treatment. Patient has not yet contacted any of the facilities discussed in previous meeting. Writer has reiterated the importance of beginning the SUD treatment process in order to prevent delay in care. Patient agrees to begin calling. Situation ongoing, CSW will continue to monitor and update note as more information becomes available.     Signed:  Durenda Hurt, MSW, LCSWA, LCAS 01/21/2022 9:38 AM

## 2022-01-21 NOTE — Plan of Care (Signed)
  Problem: Education: Goal: Knowledge of General Education information will improve Description Including pain rating scale, medication(s)/side effects and non-pharmacologic comfort measures Outcome: Progressing   Problem: Health Behavior/Discharge Planning: Goal: Ability to manage health-related needs will improve Outcome: Progressing   Problem: Clinical Measurements: Goal: Ability to maintain clinical measurements within normal limits will improve Outcome: Progressing Goal: Will remain free from infection Outcome: Progressing Goal: Diagnostic test results will improve Outcome: Progressing Goal: Respiratory complications will improve Outcome: Progressing Goal: Cardiovascular complication will be avoided Outcome: Progressing   Problem: Activity: Goal: Risk for activity intolerance will decrease Outcome: Progressing   Problem: Nutrition: Goal: Adequate nutrition will be maintained Outcome: Progressing   Problem: Coping: Goal: Level of anxiety will decrease Outcome: Progressing   Problem: Elimination: Goal: Will not experience complications related to bowel motility Outcome: Progressing Goal: Will not experience complications related to urinary retention Outcome: Progressing   Problem: Pain Managment: Goal: General experience of comfort will improve Outcome: Progressing   Problem: Safety: Goal: Ability to remain free from injury will improve Outcome: Progressing   Problem: Skin Integrity: Goal: Risk for impaired skin integrity will decrease Outcome: Progressing   Problem: Education: Goal: Ability to manage disease process will improve Outcome: Progressing   Problem: Cardiac: Goal: Ability to achieve and maintain adequate cardiopulmonary perfusion will improve Outcome: Progressing   Problem: Neurologic: Goal: Promote progressive neurologic recovery Outcome: Progressing   Problem: Skin Integrity: Goal: Risk for impaired skin integrity will be  minimized. Outcome: Progressing   

## 2022-01-21 NOTE — Progress Notes (Signed)
[facility] MD Progress Note  01/21/2022 5:05 PM Jane Watkins  MRN:  [id number] Subjective: Follow-up 26 year old woman with severe depression.  Out of her room most of the day.  Affect still very anxious and dysphoric.  Feeling depressed.  No active suicidal intent.  Still hoping that she can get into rehab.  Patient specifically asked to be taken off of Suboxone today not liking the way it feels and thinking it may be contributing to her nausea. Principal Problem: Severe recurrent major depression without psychotic features (HCC) Diagnosis: Principal Problem:   Severe recurrent major depression without psychotic features (HCC) Active Problems:   Opiate abuse, continuous (HCC)   History of ischemic stroke  Total Time spent with patient: 30 minutes  Past Psychiatric History: Past history of depression stroke longstanding substance use problems  Past Medical History:  Past Medical History:  Diagnosis Date   AKI (acute kidney injury) (HCC) 2018   "from overdose"   Anxiety    Chlamydia    Daily headache    Depression    Drug overdose 04/2016   [name] 05/01/2016   GERD (gastroesophageal reflux disease)    "when I was younger; gone now" (09/21/2017)   IV drug abuse (HCC) 09/20/2017   Migraine    "q couple weeks" (09/21/2017)   Opioid abuse (HCC)    Overdose    Stroke [facility])     Past Surgical History:  Procedure Laterality Date   APPENDECTOMY  04/2013   I & D EXTREMITY Left 09/20/2017   Procedure: IRRIGATION AND DEBRIDEMENT LEFT ARM;  Surgeon: Bradly Bienenstock, MD;  Location: [facility] OR;  Service: Orthopedics;  Laterality: Left;   TEE WITHOUT CARDIOVERSION N/A 02/10/2019   Procedure: TRANSESOPHAGEAL ECHOCARDIOGRAM (TEE);  Surgeon: Debbe Odea, MD;  Location: [facility] ORS;  Service: Cardiovascular;  Laterality: N/A;  Polysubstance abuser   TONGUE SURGERY  ~ 2007   "related to lisp"   TRACHEOSTOMY  04/2016   [name] 05/01/2016   Family History:  Family History  Problem Relation Age of Onset    Anemia Father    Healthy Sister    Thyroid cancer Maternal Grandmother    Lung cancer Maternal Grandfather        metastasized   Lung cancer Paternal Grandfather    Family Psychiatric  History: See previous Social History:  Social History   Substance and Sexual Activity  Alcohol Use No     Social History   Substance and Sexual Activity  Drug Use Yes   Types: IV, Heroin, Cocaine, Marijuana   Comment: last use of heroin and cocaine was 01/2019, marijuana daily    Social History   Socioeconomic History   Marital status: Widowed    Spouse name: Not on file   Number of children: Not on file   Years of education: high school   Highest education level: Not on file  Occupational History   Not on file  Tobacco Use   Smoking status: Every Day    Packs/day: 0.50    Years: 10.00    Total pack years: 5.00    Types: Cigarettes   Smokeless tobacco: Never  Vaping Use   Vaping Use: Never used  Substance and Sexual Activity   Alcohol use: No   Drug use: Yes    Types: IV, Heroin, Cocaine, Marijuana    Comment: last use of heroin and cocaine was 01/2019, marijuana daily   Sexual activity: Yes    Birth control/protection: I.U.D.    Comment: Paragard  Other Topics Concern  Not on file  Social History Narrative   05/24/19   From: [name], [place]   Living: with boyfriend [name]) -- former husband died from overdose   Work: not currently, in the application process for disability      Family: good relationship with dad who lives in [place], mom is in [place]. [place] relationship with bother and sister      Enjoys: training her puppy (corndog)      Exercise: walking, exercise bike in the apartment   Diet: not the best, tries to cook at home, fruit/veggies      Safety   Seat belts: Yes    Guns: No   Safe in relationships: Yes    Social Determinants of Corporate investment banker Strain: Not on file  Food Insecurity: No Food Insecurity (01/19/2022)   Hunger Vital Sign    Worried  About Running Out of Food in the Last Year: Never true    Ran Out of Food in the Last Year: Never true  Transportation Needs: No Transportation Needs (01/19/2022)   PRAPARE - Administrator, Civil Service (Medical): No    Lack of Transportation (Non-Medical): No  Physical Activity: Not on file  Stress: Not on file  Social Connections: Not on file   Additional Social History:                         Sleep: Fair  Appetite:  Fair  Current Medications: Current Facility-Administered Medications  Medication Dose Route Frequency Provider Last Rate Last Admin   acetaminophen (TYLENOL) tablet 650 mg  650 mg Oral Q6H PRN Christie Copley T, MD   650 mg at 01/21/22 0850   alum & mag hydroxide-simeth (MAALOX/MYLANTA) 200-200-20 MG/5ML suspension 30 mL  30 mL Oral Q4H PRN Zymiere Trostle, [name], MD       baclofen (LIORESAL) tablet 10 mg  10 mg Oral TID Ivis Nicolson T, MD   10 mg at 01/21/22 1126   busPIRone (BUSPAR) tablet 10 mg  10 mg Oral TID Erasmus Bistline T, MD   10 mg at 01/21/22 1126   docusate sodium (COLACE) capsule 100 mg  100 mg Oral BID Haley Fuerstenberg T, MD   100 mg at 01/21/22 0840   gabapentin (NEURONTIN) capsule 300 mg  300 mg Oral QHS Kasarah Sitts T, MD   300 mg at 01/20/22 2117   hydrOXYzine (ATARAX) tablet 50 mg  50 mg Oral Q6H PRN Cadynce Garrette T, MD   50 mg at 01/21/22 1140   lamoTRIgine (LAMICTAL) tablet 25 mg  25 mg Oral BID Akeylah Hendel T, MD   25 mg at 01/21/22 0841   magnesium hydroxide (MILK OF MAGNESIA) suspension 30 mL  30 mL Oral Daily PRN Lealer Marsland, [name], MD       menthol-cetylpyridinium (CEPACOL) lozenge 3 mg  1 lozenge Oral PRN Joshiah Traynham T, MD       nicotine (NICODERM CQ - dosed in mg/24 hours) patch 14 mg  14 mg Transdermal Daily Loriene Taunton T, MD   14 mg at 01/21/22 0841   ondansetron (ZOFRAN-ODT) disintegrating tablet 4 mg  4 mg Oral Q8H PRN Gillermo Murdoch, NP   4 mg at 01/20/22 2232   OXcarbazepine (TRILEPTAL) tablet 150 mg  150 mg Oral  BID Leven Hoel T, MD   150 mg at 01/21/22 0842   polyethylene glycol (MIRALAX / GLYCOLAX) packet 17 g  17 g Oral Daily Kristinia Leavy, [name], MD  QUEtiapine (SEROQUEL) tablet 50 mg  50 mg Oral QHS Andreus Cure T, MD   50 mg at 01/20/22 2117    Lab Results:  Results for orders placed or performed during the hospital encounter of 01/19/22 (from the past 48 hour(s))  Hepatitis C antibody     Status: None   Collection Time: 01/20/22 12:30 PM  Result Value Ref Range   HCV Ab NON REACTIVE NON REACTIVE    Comment: (NOTE) Nonreactive HCV antibody screen is consistent with no HCV infections,  unless recent infection is suspected or other evidence exists to indicate HCV infection.  Performed at [facility], [address], [place], [place] [address]   Hepatitis B surface antigen     Status: None   Collection Time: 01/20/22 12:30 PM  Result Value Ref Range   Hepatitis B Surface Ag NON REACTIVE NON REACTIVE    Comment: Performed at [facility], [address] [place], [place] [address]  Hemoglobin A1c     Status: None   Collection Time: 01/20/22 12:30 PM  Result Value Ref Range   Hgb A1c MFr Bld 5.4 4.8 - 5.6 %    Comment: (NOTE)         Prediabetes: 5.7 - 6.4         Diabetes: >6.4         Glycemic control for adults with diabetes: <7.0    Mean Plasma Glucose 108 mg/dL    Comment: (NOTE) Performed At: [facility] [address] [place], [place] [id number] Jolene Schimke MD ZC:[id number]   Lipid panel     Status: Abnormal   Collection Time: 01/21/22  6:56 AM  Result Value Ref Range   Cholesterol 192 0 - 200 mg/dL   Triglycerides 89 <287 mg/dL   HDL 56 >86 mg/dL   Total CHOL/HDL Ratio 3.4 RATIO   VLDL 18 0 - 40 mg/dL   LDL Cholesterol 767 (H) 0 - 99 mg/dL    Comment:        Total Cholesterol/HDL:CHD Risk Coronary Heart Disease Risk Table                     Men   Women  1/2 Average Risk   3.4   3.3  Average Risk       5.0   4.4  2 X Average Risk    9.6   7.1  3 X Average Risk  23.4   11.0        Use the calculated Patient Ratio above and the CHD Risk Table to determine the patient's CHD Risk.        ATP III CLASSIFICATION (LDL):  <100     mg/dL   Optimal  209-470  mg/dL   Near or Above                    Optimal  130-159  mg/dL   Borderline  962-836  mg/dL   High  >629     mg/dL   Very High Performed at [facility], [address] [place], [place] [address]     Blood Alcohol level:  Lab Results  Component Value Date   [facility] <10 04/29/2021   ETH <10 02/07/2019    Metabolic Disorder Labs: Lab Results  Component Value Date   HGBA1C 5.4 01/20/2022   MPG 108 01/20/2022   MPG 96.8 02/07/2019   No results found for: "PROLACTIN" Lab Results  Component Value  Date   CHOL 192 01/21/2022   TRIG 89 01/21/2022   HDL 56 01/21/2022   CHOLHDL 3.4 01/21/2022   VLDL 18 01/21/2022   LDLCALC 118 (H) 01/21/2022   LDLCALC 67 11/05/2020    Physical Findings: AIMS:  , ,  ,  ,    CIWA:    COWS:     Musculoskeletal: Strength & Muscle Tone: within normal limits Gait & Station: normal Patient leans: N/A  Psychiatric Specialty Exam:  Presentation  General Appearance:  Appropriate for Environment  Eye Contact: Good  Speech: Clear and Coherent; Normal Rate  Speech Volume: Normal  Handedness: Right   Mood and Affect  Mood: Euthymic  Affect: Appropriate; Congruent   Thought Process  Thought Processes: Coherent; Goal Directed  Descriptions of Associations:Intact  Orientation:Full (Time, Place and Person)  Thought Content:No data recorded History of Schizophrenia/Schizoaffective disorder:No data recorded Duration of Psychotic Symptoms:No data recorded Hallucinations:No data recorded Ideas of Reference:None  Suicidal Thoughts:No data recorded Homicidal Thoughts:No data recorded  Sensorium  Memory: Immediate Good; Recent Good  Judgment: Intact  Insight: Present   Executive  Functions  Concentration: Good  Attention Span: Good  Recall: Good  Fund of Knowledge: Good  Language: Good   Psychomotor Activity  Psychomotor Activity:No data recorded  Assets  Assets: Communication Skills; Desire for Improvement; Housing; Health and safety inspectorinancial Resources/Insurance; Social Support; Physical Health   Sleep  Sleep:No data recorded   Physical Exam: Physical Exam Vitals and nursing note reviewed.  Constitutional:      Appearance: Normal appearance.  HENT:     Head: Normocephalic and atraumatic.     Mouth/Throat:     Pharynx: Oropharynx is clear.  Eyes:     Pupils: Pupils are equal, round, and reactive to light.  Cardiovascular:     Rate and Rhythm: Normal rate and regular rhythm.  Pulmonary:     Effort: Pulmonary effort is normal.     Breath sounds: Normal breath sounds.  Abdominal:     General: Abdomen is flat.     Palpations: Abdomen is soft.  Musculoskeletal:        General: Normal range of motion.  Skin:    General: Skin is warm and dry.  Neurological:     General: No focal deficit present.     Mental Status: She is alert. Mental status is at baseline.  Psychiatric:        Attention and Perception: Attention normal.        Mood and Affect: Mood normal. Affect is blunt.        Speech: Speech normal.        Behavior: Behavior is cooperative.        Thought Content: Thought content normal.        Cognition and Memory: Cognition normal.    Review of Systems  Constitutional: Negative.   HENT: Negative.    Eyes: Negative.   Respiratory: Negative.    Cardiovascular: Negative.   Gastrointestinal: Negative.   Musculoskeletal: Negative.   Skin: Negative.   Neurological: Negative.   Psychiatric/Behavioral:  Positive for depression.    Blood pressure 116/68, pulse 73, temperature 98.4 F (36.9 C), temperature source Oral, resp. rate 18, height 5\' 2"  (1.575 m), weight 58.5 kg, last menstrual period 12/24/2021, SpO2 100 %. Body mass index is 23.59  kg/m.   Treatment Plan Summary: Medication management and Plan continue current medication.  Discontinue Suboxone.  Encourage group attendance.  Work with social work about placement options especially for rehab.  Harland Aguiniga,  MD 01/21/2022, 5:06 PM

## 2022-01-21 NOTE — [facility] Suicide Risk Assessment (Signed)
[facility] INPATIENT:  Family/Significant Other Suicide Prevention Education  Suicide Prevention Education:  Education Completed;  Corky Sing, mother, [phone] has been identified by the patient as the family member/significant other with whom the patient will be residing, and identified as the person(s) who will aid the patient in the event of a mental health crisis (suicidal ideations/suicide attempt).  With written consent from the patient, the family member/significant other has been provided the following suicide prevention education, prior to the and/or following the discharge of the patient.  In addition to the precautions below, patient's support agrees to monitor patient for safety, ensure treatment compliance, and alert emergency services should patient require such services. Mother states she has concerns that the patient states she intentionally attempted to kill herself in overdose and expresses that she was not  revived. Also states that her attempt was conducted as a suicide pact with friend/partner "Ryan." Identifies the death of her step mother and great aunt as contributing factors to her suicide attempt. Currently has an interest in the patient attending SUD treatment.    The suicide prevention education provided includes the following: Suicide risk factors Suicide prevention and interventions National Suicide Hotline telephone number [facility] assessment telephone number [facility] Emergency Assistance 911 [facility] and/or Residential Mobile Crisis Unit telephone number  Request made of family/significant other to: Remove weapons (e.g., guns, rifles, knives), all items previously/currently identified as safety concern.   Remove drugs/medications (over-the-counter, prescriptions, illicit drugs), all items previously/currently identified as a safety concern.  The family member/significant other verbalizes understanding of the suicide prevention education  information provided.  The family member/significant other agrees to remove the items of safety concern listed above.  Corky Crafts 01/21/2022, 11:12 AM

## 2022-01-22 DIAGNOSIS — F332 Major depressive disorder, recurrent severe without psychotic features: Secondary | ICD-10-CM | POA: Diagnosis not present

## 2022-01-22 NOTE — Group Note (Signed)
[facility] LCSW Group Therapy Note   Group Date: 01/22/2022 Start Time: 1300 End Time: 1400   Type of Therapy/Topic:  Group Therapy:  Emotion Regulation  Participation Level:  Active   Mood:  Description of Group:    The purpose of this group is to assist patients in learning to regulate negative emotions and experience positive emotions. Patients will be guided to discuss ways in which they have been vulnerable to their negative emotions. These vulnerabilities will be juxtaposed with experiences of positive emotions or situations, and patients challenged to use positive emotions to combat negative ones. Special emphasis will be placed on coping with negative emotions in conflict situations, and patients will process healthy conflict resolution skills.  Therapeutic Goals: Patient will identify two positive emotions or experiences to reflect on in order to balance out negative emotions:  Patient will label two or more emotions that they find the most difficult to experience:  Patient will be able to demonstrate positive conflict resolution skills through discussion or role plays:   Summary of Patient Progress: Patient was present in group.  Patient was able to identify why knowing why emotions are important is important.  Patient reports that she has been feeling anxious.  Patient was able to identify coping skills.  Patient was appropriate in group and showed fair insight.    Therapeutic Modalities:   Cognitive Behavioral Therapy Feelings Identification Dialectical Behavioral Therapy   Harden Mo, LCSW

## 2022-01-22 NOTE — Progress Notes (Signed)
Patient calm and pleasant during assessment denying SI/HI/AVH. Patient observed interacting appropriately with staff and peers on the unit. Pt compliant with medication administration per MD orders. Pt given education, support, and encouragement to be active in her treatment plan. Pt being monitored Q 15 minutes for safety per unit protocol, remains safe on the unit

## 2022-01-22 NOTE — Progress Notes (Signed)
Recreation Therapy Notes   Date: 01/22/2022  Time: 10:35 am    Location: Craft room     Behavioral response: N/A   Intervention Topic: Values   Discussion/Intervention: Patient refused to attend group.   Clinical Observations/Feedback:  Patient refused to attend group.    Korra Christine LRT/CTRS        Tiny Chaudhary 01/22/2022 11:50 AM

## 2022-01-22 NOTE — Progress Notes (Signed)
Recreation Therapy Notes  INPATIENT RECREATION THERAPY ASSESSMENT  Patient Details Name: Marlee Armenteros MRN: [id number] DOB: 1996-01-07 Today's Date: 01/22/2022       Information Obtained From: Patient  Able to Participate in Assessment/Interview: Yes  Patient Presentation: Responsive  Reason for Admission (Per Patient): Active Symptoms, Substance Abuse  Patient Stressors:    Coping Skills:   Isolation, Journal, Talk, Avoidance, Substance Abuse  Leisure Interests (2+):  Social - Family, Social - Friends, Games - Cards, Games - Careers adviser, Games - Arts development officer of Recreation/Participation: Marketing executive Resources:  Yes  Community Resources:  Park  Current Use: Yes  If no, Barriers?:    Expressed Interest in [employer] Information: Yes  [employer] of Residence:  [place]  Patient Main Form of Transportation: Set designer  Patient Strengths:  Thoughtful, funny, caring, honest,  Patient Identified Areas of Improvement:  Self-love, compassion  Patient Goal for Hospitalization:  Get a better sense of self and get a plan to stay sober after discharge.  Current SI (including self-harm):  No  Current HI:  No  Current AVH: No (ringing in ears)  Staff Intervention Plan: Group Attendance, Collaborate with Interdisciplinary Treatment Team  Consent to Intern Participation: N/A  Greysin Medlen 01/22/2022, 12:27 PM

## 2022-01-22 NOTE — Progress Notes (Signed)
[facility] MD Progress Note  01/22/2022 3:43 PM Jane Watkins  MRN:  [id number] Subjective: Follow-up 26 year old woman history of depression and substance abuse.  Patient says her mood is a little bit better.  Still feels nervous and anxious.  The ward is somewhat upsetting to her.  Denies having any suicidal thought however.  Patient has spoken with social work and is working on referrals to further treatment.  She mentions to me that her hearing in her right ear is continuing to get worse with worsening tinnitus Principal Problem: Severe recurrent major depression without psychotic features (HCC) Diagnosis: Principal Problem:   Severe recurrent major depression without psychotic features (HCC) Active Problems:   Opiate abuse, continuous (HCC)   History of ischemic stroke  Total Time spent with patient: 30 minutes  Past Psychiatric History: Past history of chronic substance use and a history of stroke  Past Medical History:  Past Medical History:  Diagnosis Date   AKI (acute kidney injury) (HCC) 2018   "from overdose"   Anxiety    Chlamydia    Daily headache    Depression    Drug overdose 04/2016   [name] 05/01/2016   GERD (gastroesophageal reflux disease)    "when I was younger; gone now" (09/21/2017)   IV drug abuse (HCC) 09/20/2017   Migraine    "q couple weeks" (09/21/2017)   Opioid abuse (HCC)    Overdose    [facility])     Past Surgical History:  Procedure Laterality Date   APPENDECTOMY  04/2013   I & D EXTREMITY Left 09/20/2017   Procedure: IRRIGATION AND DEBRIDEMENT LEFT ARM;  Surgeon: Bradly Bienenstock, MD;  Location: [facility] OR;  Service: Orthopedics;  Laterality: Left;   TEE WITHOUT CARDIOVERSION N/A 02/10/2019   Procedure: TRANSESOPHAGEAL ECHOCARDIOGRAM (TEE);  Surgeon: Debbe Odea, MD;  Location: [facility] ORS;  Service: Cardiovascular;  Laterality: N/A;  Polysubstance abuser   TONGUE SURGERY  ~ 2007   "related to lisp"   TRACHEOSTOMY  04/2016   [name] 05/01/2016   Family  History:  Family History  Problem Relation Age of Onset   Anemia Father    Healthy Sister    Thyroid cancer Maternal Grandmother    Lung cancer Maternal Grandfather        metastasized   Lung cancer Paternal Grandfather    Family Psychiatric  History: None reported Social History:  Social History   Substance and Sexual Activity  Alcohol Use No     Social History   Substance and Sexual Activity  Drug Use Yes   Types: IV, Heroin, Cocaine, Marijuana   Comment: last use of heroin and cocaine was 01/2019, marijuana daily    Social History   Socioeconomic History   Marital status: Widowed    Spouse name: Not on file   Number of children: Not on file   Years of education: high school   Highest education level: Not on file  Occupational History   Not on file  Tobacco Use   Smoking status: Every Day    Packs/day: 0.50    Years: 10.00    Total pack years: 5.00    Types: Cigarettes   Smokeless tobacco: Never  Vaping Use   Vaping Use: Never used  Substance and Sexual Activity   Alcohol use: No   Drug use: Yes    Types: IV, Heroin, Cocaine, Marijuana    Comment: last use of heroin and cocaine was 01/2019, marijuana daily   Sexual activity: Yes  Birth control/protection: I.U.D.    Comment: Paragard  Other Topics Concern   Not on file  Social History Narrative   05/24/19   From: [name], [place]   Living: with boyfriend [name]) -- former husband died from overdose   Work: not currently, in the application process for disability      Family: good relationship with dad who lives in [place], mom is in [place]. [place] relationship with bother and sister      Enjoys: training her puppy (corndog)      Exercise: walking, exercise bike in the apartment   Diet: not the best, tries to cook at home, fruit/veggies      Safety   Seat belts: Yes    Guns: No   Safe in relationships: Yes    Social Determinants of Corporate investment banker Strain: Not on file  Food Insecurity: No  Food Insecurity (01/19/2022)   Hunger Vital Sign    Worried About Running Out of Food in the Last Year: Never true    Ran Out of Food in the Last Year: Never true  Transportation Needs: No Transportation Needs (01/19/2022)   PRAPARE - Administrator, Civil Service (Medical): No    Lack of Transportation (Non-Medical): No  Physical Activity: Not on file  Stress: Not on file  Social Connections: Not on file   Additional Social History:                         Sleep: Fair  Appetite:  Fair  Current Medications: Current Facility-Administered Medications  Medication Dose Route Frequency Provider Last Rate Last Admin   acetaminophen (TYLENOL) tablet 650 mg  650 mg Oral Q6H PRN Jashon Ishida T, MD   650 mg at 01/21/22 0850   alum & mag hydroxide-simeth (MAALOX/MYLANTA) 200-200-20 MG/5ML suspension 30 mL  30 mL Oral Q4H PRN Nyeisha Goodall, [name], MD       baclofen (LIORESAL) tablet 10 mg  10 mg Oral TID Rhianne Soman T, MD   10 mg at 01/22/22 1158   busPIRone (BUSPAR) tablet 10 mg  10 mg Oral TID Kanyia Heaslip T, MD   10 mg at 01/22/22 1157   docusate sodium (COLACE) capsule 100 mg  100 mg Oral BID Elleni Mozingo T, MD   100 mg at 01/22/22 0911   gabapentin (NEURONTIN) capsule 300 mg  300 mg Oral QHS Rashaunda Rahl T, MD   300 mg at 01/21/22 2111   hydrOXYzine (ATARAX) tablet 50 mg  50 mg Oral Q6H PRN Eloyse Causey T, MD   50 mg at 01/21/22 1140   lamoTRIgine (LAMICTAL) tablet 25 mg  25 mg Oral BID Johnpatrick Jenny T, MD   25 mg at 01/22/22 0911   magnesium hydroxide (MILK OF MAGNESIA) suspension 30 mL  30 mL Oral Daily PRN Ilian Wessell T, MD       menthol-cetylpyridinium (CEPACOL) lozenge 3 mg  1 lozenge Oral PRN Trayonna Bachmeier T, MD       nicotine (NICODERM CQ - dosed in mg/24 hours) patch 14 mg  14 mg Transdermal Daily Damiana Berrian T, MD   14 mg at 01/22/22 0913   ondansetron (ZOFRAN-ODT) disintegrating tablet 4 mg  4 mg Oral Q8H PRN Gillermo Murdoch, NP   4 mg at 01/20/22  2232   OXcarbazepine (TRILEPTAL) tablet 150 mg  150 mg Oral BID Rolan Wrightsman, [name], MD   150 mg at 01/22/22 0912   QUEtiapine (SEROQUEL) tablet  50 mg  50 mg Oral QHS Ronie Barnhart, [name], MD   50 mg at 01/21/22 2111    Lab Results:  Results for orders placed or performed during the hospital encounter of 01/19/22 (from the past 48 hour(s))  Lipid panel     Status: Abnormal   Collection Time: 01/21/22  6:56 AM  Result Value Ref Range   Cholesterol 192 0 - 200 mg/dL   Triglycerides 89 <726 mg/dL   HDL 56 >20 mg/dL   Total CHOL/HDL Ratio 3.4 RATIO   VLDL 18 0 - 40 mg/dL   LDL Cholesterol 355 (H) 0 - 99 mg/dL    Comment:        Total Cholesterol/HDL:CHD Risk Coronary Heart Disease Risk Table                     Men   Women  1/2 Average Risk   3.4   3.3  Average Risk       5.0   4.4  2 X Average Risk   9.6   7.1  3 X Average Risk  23.4   11.0        Use the calculated Patient Ratio above and the CHD Risk Table to determine the patient's CHD Risk.        ATP III CLASSIFICATION (LDL):  <100     mg/dL   Optimal  974-163  mg/dL   Near or Above                    Optimal  130-159  mg/dL   Borderline  845-364  mg/dL   High  >680     mg/dL   Very High Performed at [facility], [address] [place], [place] [address]     Blood Alcohol level:  Lab Results  Component Value Date   [facility] <10 04/29/2021   ETH <10 02/07/2019    Metabolic Disorder Labs: Lab Results  Component Value Date   HGBA1C 5.4 01/20/2022   MPG 108 01/20/2022   MPG 96.8 02/07/2019   No results found for: "PROLACTIN" Lab Results  Component Value Date   CHOL 192 01/21/2022   TRIG 89 01/21/2022   HDL 56 01/21/2022   CHOLHDL 3.4 01/21/2022   VLDL 18 01/21/2022   LDLCALC 118 (H) 01/21/2022   LDLCALC 67 11/05/2020    Physical Findings: AIMS:  , ,  ,  ,    CIWA:    COWS:     Musculoskeletal: Strength & Muscle Tone: within normal limits Gait & Station: normal Patient leans: Left  Psychiatric  Specialty Exam:  Presentation  General Appearance:  Appropriate for Environment  Eye Contact: Good  Speech: Clear and Coherent; Normal Rate  Speech Volume: Normal  Handedness: Right   Mood and Affect  Mood: Euthymic  Affect: Appropriate; Congruent   Thought Process  Thought Processes: Coherent; Goal Directed  Descriptions of Associations:Intact  Orientation:Full (Time, Place and Person)  Thought Content:No data recorded History of Schizophrenia/Schizoaffective disorder:No data recorded Duration of Psychotic Symptoms:No data recorded Hallucinations:No data recorded Ideas of Reference:None  Suicidal Thoughts:No data recorded Homicidal Thoughts:No data recorded  Sensorium  Memory: Immediate Good; Recent Good  Judgment: Intact  Insight: Present   Executive Functions  Concentration: Good  Attention Span: Good  Recall: Good  Fund of Knowledge: Good  Language: Good   Psychomotor Activity  Psychomotor Activity:No data recorded  Assets  Assets: Communication Skills; Desire for Improvement; Housing; Health and safety inspector; Social  Support; Physical Health   Sleep  Sleep:No data recorded   Physical Exam: Physical Exam Constitutional:      Appearance: Normal appearance.  HENT:     Head: Normocephalic and atraumatic.     Mouth/Throat:     Pharynx: Oropharynx is clear.  Eyes:     Pupils: Pupils are equal, round, and reactive to light.  Cardiovascular:     Rate and Rhythm: Normal rate and regular rhythm.  Pulmonary:     Effort: Pulmonary effort is normal.     Breath sounds: Normal breath sounds.  Abdominal:     General: Abdomen is flat.     Palpations: Abdomen is soft.  Musculoskeletal:        General: Normal range of motion.  Skin:    General: Skin is warm and dry.  Neurological:     General: No focal deficit present.     Mental Status: She is alert. Mental status is at baseline.     Comments: Left-sided weakness  with a limp  Psychiatric:        Mood and Affect: Mood normal.        Thought Content: Thought content normal.    Review of Systems  Constitutional: Negative.   HENT:  Positive for hearing loss.   Eyes: Negative.   Respiratory: Negative.    Cardiovascular: Negative.   Gastrointestinal: Negative.   Musculoskeletal: Negative.   Skin: Negative.   Neurological: Negative.   Psychiatric/Behavioral:  Positive for depression. Negative for suicidal ideas. The patient is nervous/anxious.    Blood pressure (!) 96/52, pulse 79, temperature 98 F (36.7 C), temperature source Oral, resp. rate 18, height 5\' 2"  (1.575 m), weight 58.5 kg, last menstrual period 12/24/2021, SpO2 99 %. Body mass index is 23.59 kg/m.   Treatment Plan Summary: Medication management and Plan no change to medication.  Feels better without the Suboxone.  I am going to try and follow up and see if there is anything we can do about evaluating the auditory change.  Encourage group attendance encouraged patient to be up and out of her room.  Will follow-up with treatment team about possible rehab.  Mordecai RasmussenJohn Adysson Revelle, MD 01/22/2022, 3:43 PM

## 2022-01-22 NOTE — Progress Notes (Signed)
D- Patient alert and oriented x4. Affect flat/mood sad. Denies SI/ HI/AVH. Complains of head and chest pain 3/10. Her goal today is an "effective discharge plan and get care for my ears". A- Scheduled medications administered to patient, per MD orders. Support and encouragement provided.  Routine safety checks conducted every 15 minutes without incident.  Patient informed to notify staff with problems or concerns. R- No adverse drug reactions noted.  Patient compliant with medications and treatment plan. Patient receptive, calm, and cooperative and interacts well with others on the unit. She has been out of her room most of the day. She contracts for safety and remains safe on the unit at this time.

## 2022-01-22 NOTE — [facility] Group Notes (Signed)
[facility] Group Notes:  (Nursing/MHT/Case Management/Adjunct)  Date:  01/22/2022  Time:  8:21 PM  Type of Therapy:   Wrap up  Participation Level:  Active  Participation Quality:  Appropriate  Affect:  Appropriate  Cognitive:  Alert  Insight:  Good  Engagement in Group:  Engaged and her goal was to see the Doctor  Modes of Intervention:  Support  Summary of Progress/Problems:  Jane Watkins 01/22/2022, 8:21 PM

## 2022-01-22 NOTE — Plan of Care (Signed)
  Problem: Education: Goal: Knowledge of General Education information will improve Description Including pain rating scale, medication(s)/side effects and non-pharmacologic comfort measures Outcome: Progressing   Problem: Health Behavior/Discharge Planning: Goal: Ability to manage health-related needs will improve Outcome: Progressing   Problem: Clinical Measurements: Goal: Ability to maintain clinical measurements within normal limits will improve Outcome: Progressing Goal: Will remain free from infection Outcome: Progressing Goal: Diagnostic test results will improve Outcome: Progressing Goal: Respiratory complications will improve Outcome: Progressing Goal: Cardiovascular complication will be avoided Outcome: Progressing   Problem: Activity: Goal: Risk for activity intolerance will decrease Outcome: Progressing   Problem: Nutrition: Goal: Adequate nutrition will be maintained Outcome: Progressing   Problem: Coping: Goal: Level of anxiety will decrease Outcome: Progressing   Problem: Elimination: Goal: Will not experience complications related to bowel motility Outcome: Progressing Goal: Will not experience complications related to urinary retention Outcome: Progressing   Problem: Pain Managment: Goal: General experience of comfort will improve Outcome: Progressing   Problem: Safety: Goal: Ability to remain free from injury will improve Outcome: Progressing   Problem: Skin Integrity: Goal: Risk for impaired skin integrity will decrease Outcome: Progressing   Problem: Education: Goal: Ability to manage disease process will improve Outcome: Progressing   Problem: Cardiac: Goal: Ability to achieve and maintain adequate cardiopulmonary perfusion will improve Outcome: Progressing   Problem: Neurologic: Goal: Promote progressive neurologic recovery Outcome: Progressing   Problem: Skin Integrity: Goal: Risk for impaired skin integrity will be  minimized. Outcome: Progressing   

## 2022-01-22 NOTE — Progress Notes (Signed)
Recreation Therapy Notes  INPATIENT RECREATION TR PLAN  Patient Details Name: Jane Watkins MRN: [id number] DOB: 11-11-1995 Today's Date: 01/22/2022  Rec Therapy Plan Is patient appropriate for Therapeutic Recreation?: Yes Treatment times per week: at least 3 Estimated Length of Stay: 5-7 days TR Treatment/Interventions: Group participation (Comment)  Discharge Criteria Pt will be discharged from therapy if:: Discharged Treatment plan/goals/alternatives discussed and agreed upon by:: Patient/family  Discharge Summary     Dionel Archey 01/22/2022, 12:27 PM

## 2022-01-23 ENCOUNTER — Inpatient Hospital Stay: Payer: Medicare HMO

## 2022-01-23 ENCOUNTER — Encounter: Payer: Self-pay | Admitting: Psychiatry

## 2022-01-23 DIAGNOSIS — F332 Major depressive disorder, recurrent severe without psychotic features: Secondary | ICD-10-CM | POA: Diagnosis not present

## 2022-01-23 MED ORDER — GADOBUTROL 1 MMOL/ML IV SOLN: 5.0000 mL | Freq: Once | INTRAVENOUS | Status: DC | PRN

## 2022-01-23 NOTE — Progress Notes (Signed)
Recreation Therapy Notes    Date: 01/23/2022   Time: 09:45 am     Location: Craft room      Behavioral response: N/A   Intervention Topic: Coping skills   Discussion/Intervention: Patient refused to attend group.    Clinical Observations/Feedback:  Patient refused to attend group.    Torien Ramroop LRT/CTRS        Jane Watkins 01/23/2022 10:19 AM

## 2022-01-23 NOTE — Plan of Care (Signed)
D- Patient alert and oriented. Patient presented in an anxious, but pleasant mood on assessment stating that she slept good last night and had no complaints to voice to this Clinical research associate. Patient denied any signs/symptoms of depression, but endorsed anxiety, rating it a "5/10". Patient stated that "just some of the other people here", is why she's feeling this way, but did not request any PRN medication. Patient also denied SI, HI, AVH, and pain at this time. Patient's stated goal for today is "trying to get in touch with a rehab".  A- Scheduled medications administered to patient, per MD orders. Support and encouragement provided.  Routine safety checks conducted every 15 minutes.  Patient informed to notify staff with problems or concerns.  R- No adverse drug reactions noted. Patient contracts for safety at this time. Patient compliant with medications and treatment plan. Patient receptive, calm, and cooperative. Patient interacts well with others on the unit.  Patient remains safe at this time.  Problem: Education: Goal: Knowledge of General Education information will improve Description: Including pain rating scale, medication(s)/side effects and non-pharmacologic comfort measures Outcome: Progressing   Problem: Health Behavior/Discharge Planning: Goal: Ability to manage health-related needs will improve Outcome: Progressing   Problem: Clinical Measurements: Goal: Ability to maintain clinical measurements within normal limits will improve Outcome: Progressing Goal: Will remain free from infection Outcome: Progressing Goal: Diagnostic test results will improve Outcome: Progressing Goal: Respiratory complications will improve Outcome: Progressing Goal: Cardiovascular complication will be avoided Outcome: Progressing   Problem: Activity: Goal: Risk for activity intolerance will decrease Outcome: Progressing   Problem: Nutrition: Goal: Adequate nutrition will be maintained Outcome:  Progressing   Problem: Coping: Goal: Level of anxiety will decrease Outcome: Progressing   Problem: Elimination: Goal: Will not experience complications related to bowel motility Outcome: Progressing Goal: Will not experience complications related to urinary retention Outcome: Progressing   Problem: Pain Managment: Goal: General experience of comfort will improve Outcome: Progressing   Problem: Safety: Goal: Ability to remain free from injury will improve Outcome: Progressing   Problem: Skin Integrity: Goal: Risk for impaired skin integrity will decrease Outcome: Progressing   Problem: Education: Goal: Ability to manage disease process will improve Outcome: Progressing   Problem: Cardiac: Goal: Ability to achieve and maintain adequate cardiopulmonary perfusion will improve Outcome: Progressing   Problem: Neurologic: Goal: Promote progressive neurologic recovery Outcome: Progressing   Problem: Skin Integrity: Goal: Risk for impaired skin integrity will be minimized. Outcome: Progressing

## 2022-01-23 NOTE — Group Note (Signed)
[facility] LCSW Group Therapy Note   Group Date: 01/23/2022 Start Time: 1300 End Time: 1400   Type of Therapy/Topic:  Group Therapy:  Emotion Regulation  Participation Level:  Minimal   Mood: euthymic  Description of Group:    The purpose of this group is to assist patients in learning to regulate negative emotions and experience positive emotions. Patients will be guided to discuss ways in which they have been vulnerable to their negative emotions. These vulnerabilities will be juxtaposed with experiences of positive emotions or situations, and patients challenged to use positive emotions to combat negative ones. Special emphasis will be placed on coping with negative emotions in conflict situations, and patients will process healthy conflict resolution skills.  Therapeutic Goals: Patient will identify two positive emotions or experiences to reflect on in order to balance out negative emotions:  Patient will label two or more emotions that they find the most difficult to experience:  Patient will be able to demonstrate positive conflict resolution skills through discussion or role plays:   Summary of Patient Progress: Patient was present for the entirety of group session. Patient participated in opening and closing remarks. However, patient did not contribute at all to the topic of discussion despite encouraged participation.  Patient unable to speak with projecting voice     Therapeutic Modalities:   Cognitive Behavioral Therapy Feelings Identification Dialectical Behavioral Therapy   Corky Crafts, [place]

## 2022-01-23 NOTE — Progress Notes (Signed)
[facility] MD Progress Note  01/23/2022 11:20 AM Jane Watkins  MRN:  [id number] Subjective: Follow-up 26 year old woman with severe depression and substance use and recent suicide attempt.  Patient continues to complain of progressive hearing loss.  I have requested audiology consult.  Not clear if this will be available.  Spoke today with neurology.  We reviewed her symptoms.  Neurology recommended repeat MRI and I have ordered that with a request to focus on the ear and auditory areas.  Patient reports mood is still dysphoric but improved no active suicidal ideation.  Remains fatigued much of the time Principal Problem: Severe recurrent major depression without psychotic features (HCC) Diagnosis: Principal Problem:   Severe recurrent major depression without psychotic features (HCC) Active Problems:   Opiate abuse, continuous (HCC)   History of ischemic stroke  Total Time spent with patient: 30 minutes  Past Psychiatric History: Past history of depression and opiate use drug abuse and recent serious suicide attempt with near death  Past Medical History:  Past Medical History:  Diagnosis Date   AKI (acute kidney injury) (HCC) 2018   "from overdose"   Anxiety    Chlamydia    Daily headache    Depression    Drug overdose 04/2016   [name] 05/01/2016   GERD (gastroesophageal reflux disease)    "when I was younger; gone now" (09/21/2017)   IV drug abuse (HCC) 09/20/2017   Migraine    "q couple weeks" (09/21/2017)   Opioid abuse (HCC)    Overdose    Stroke [facility])     Past Surgical History:  Procedure Laterality Date   APPENDECTOMY  04/2013   I & D EXTREMITY Left 09/20/2017   Procedure: IRRIGATION AND DEBRIDEMENT LEFT ARM;  Surgeon: Bradly Bienenstock, MD;  Location: [facility] OR;  Service: Orthopedics;  Laterality: Left;   TEE WITHOUT CARDIOVERSION N/A 02/10/2019   Procedure: TRANSESOPHAGEAL ECHOCARDIOGRAM (TEE);  Surgeon: Debbe Odea, MD;  Location: [facility] ORS;  Service: Cardiovascular;   Laterality: N/A;  Polysubstance abuser   TONGUE SURGERY  ~ 2007   "related to lisp"   TRACHEOSTOMY  04/2016   [name] 05/01/2016   Family History:  Family History  Problem Relation Age of Onset   Anemia Father    Healthy Sister    Thyroid cancer Maternal Grandmother    Lung cancer Maternal Grandfather        metastasized   Lung cancer Paternal Grandfather    Family Psychiatric  History: See previous Social History:  Social History   Substance and Sexual Activity  Alcohol Use No     Social History   Substance and Sexual Activity  Drug Use Yes   Types: IV, Heroin, Cocaine, Marijuana   Comment: last use of heroin and cocaine was 01/2019, marijuana daily    Social History   Socioeconomic History   Marital status: Widowed    Spouse name: Not on file   Number of children: Not on file   Years of education: high school   Highest education level: Not on file  Occupational History   Not on file  Tobacco Use   Smoking status: Every Day    Packs/day: 0.50    Years: 10.00    Total pack years: 5.00    Types: Cigarettes   Smokeless tobacco: Never  Vaping Use   Vaping Use: Never used  Substance and Sexual Activity   Alcohol use: No   Drug use: Yes    Types: IV, Heroin, Cocaine, Marijuana    Comment:  last use of heroin and cocaine was 01/2019, marijuana daily   Sexual activity: Yes    Birth control/protection: I.U.D.    Comment: Paragard  Other Topics Concern   Not on file  Social History Narrative   05/24/19   From[place]   Living: with boyfriend [name]([name]) -- former husband died from overdose   Work: not currently, in the application process for disability      Family: good relationship with dad who lives in [place], mom [place] in [place]. [place] with bother and sister      Enjoys: training her puppy (corndog)      Exercise: walking, exercise bike in the apartment   Diet: not the best, tries to cook at home, fruit/veggies      Safety   Seat belts: Yes     Guns: No   Safe in relationships: Yes    Social Determinants of Corporate investment bankerHealth   Financial Resource Strain: Not on file  Food Insecurity: No Food Insecurity (01/19/2022)   Hunger Vital Sign    Worried About Running Out of Food in the Last Year: Never true    Ran Out of Food in the Last Year: Never true  Transportation Needs: No Transportation Needs (01/19/2022)   PRAPARE - Administrator, Civil ServiceTransportation    Lack of Transportation (Medical): No    Lack of Transportation (Non-Medical): No  Physical Activity: Not on file  Stress: Not on file  Social Connections: Not on file   Additional Social History:                         Sleep: Fair  Appetite:  Fair  Current Medications: Current Facility-Administered Medications  Medication Dose Route Frequency Provider Last Rate Last Admin   acetaminophen (TYLENOL) tablet 650 mg  650 mg Oral Q6H PRN [place] Osmer T, MD   650 mg at 01/21/22 0850   alum & mag hydroxide-simeth (MAALOX/MYLANTA) 200-200-20 MG/5ML suspension 30 mL  30 mL Oral Q4H PRN Momen Ham, Jackquline DenmarkJohn T, MD       baclofen (LIORESAL) tablet 10 mg  10 mg Oral TID Akima Slaugh, Jackquline DenmarkJohn T, MD   10 mg at 01/23/22 0859   busPIRone (BUSPAR) tablet 10 mg  10 mg Oral TID Gabryela Kimbrell, Jackquline DenmarkJohn T, MD   10 mg at 01/23/22 0859   docusate sodium (COLACE) capsule 100 mg  100 mg Oral BID [place]ove Haydon T, MD   100 mg at 01/23/22 0859   gabapentin (NEURONTIN) capsule 300 mg  300 mg Oral QHS Johnnie Goynes T, MD   300 mg at 01/22/22 2117   hydrOXYzine (ATARAX) tablet 50 mg  50 mg Oral Q6H PRN Donetta Isaza T, MD   50 mg at 01/22/22 1751   lamoTRIgine (LAMICTAL) tablet 25 mg  25 mg Oral BID Anthonyjames Bargar T, MD   25 mg at 01/23/22 0859   magnesium hydroxide (MILK OF MAGNESIA) suspension 30 mL  30 mL Oral Daily PRN Jassmine Vandruff T, MD       menthol-cetylpyridinium (CEPACOL) lozenge 3 mg  1 lozenge Oral PRN Denia Mcvicar T, MD       nicotine (NICODERM CQ - dosed in mg/24 hours) patch 14 mg  14 mg Transdermal Daily Brier Firebaugh T, MD   14  mg at 01/23/22 0859   ondansetron (ZOFRAN-ODT) disintegrating tablet 4 mg  4 mg Oral Q8H PRN Gillermo Murdochhompson, Jacqueline, NP   4 mg at 01/20/22 2232   OXcarbazepine (TRILEPTAL) tablet 150 mg  150 mg  Oral BID Alyannah Sanks, [name], MD   150 mg at 01/23/22 0858   QUEtiapine (SEROQUEL) tablet 50 mg  50 mg Oral QHS Robley Matassa, [name], MD   50 mg at 01/22/22 2117    Lab Results: No results found for this or any previous visit (from the past 48 hour(s)).  Blood Alcohol level:  Lab Results  Component Value Date   ETH <10 04/29/2021   ETH <10 02/07/2019    Metabolic Disorder Labs: Lab Results  Component Value Date   HGBA1C 5.4 01/20/2022   MPG 108 01/20/2022   MPG 96.8 02/07/2019   No results found for: "PROLACTIN" Lab Results  Component Value Date   CHOL 192 01/21/2022   TRIG 89 01/21/2022   HDL 56 01/21/2022   CHOLHDL 3.4 01/21/2022   VLDL 18 01/21/2022   LDLCALC 118 (H) 01/21/2022   LDLCALC 67 11/05/2020    Physical Findings: AIMS:  , ,  ,  ,    CIWA:    COWS:     Musculoskeletal: Strength & Muscle Tone: within normal limits Gait & Station: normal Patient leans: N/A  Psychiatric Specialty Exam:  Presentation  General Appearance:  Appropriate for Environment  Eye Contact: Good  Speech: Clear and Coherent; Normal Rate  Speech Volume: Normal  Handedness: Right   Mood and Affect  Mood: Euthymic  Affect: Appropriate; Congruent   Thought Process  Thought Processes: Coherent; Goal Directed  Descriptions of Associations:Intact  Orientation:Full (Time, Place and Person)  Thought Content:No data recorded History of Schizophrenia/Schizoaffective disorder:No data recorded Duration of Psychotic Symptoms:No data recorded Hallucinations:No data recorded Ideas of Reference:None  Suicidal Thoughts:No data recorded Homicidal Thoughts:No data recorded  Sensorium  Memory: Immediate Good; Recent Good  Judgment: Intact  Insight: Present   Executive Functions   Concentration: Good  Attention Span: Good  Recall: Good  Fund of Knowledge: Good  Language: Good   Psychomotor Activity  Psychomotor Activity:No data recorded  Assets  Assets: Communication Skills; Desire for Improvement; Housing; Health and safety inspector; Social Support; Physical Health   Sleep  Sleep:No data recorded   Physical Exam: Physical Exam Vitals and nursing note reviewed.  Constitutional:      Appearance: Normal appearance.  HENT:     Head: Normocephalic and atraumatic.     Mouth/Throat:     Pharynx: Oropharynx is clear.  Eyes:     Pupils: Pupils are equal, round, and reactive to light.  Cardiovascular:     Rate and Rhythm: Normal rate and regular rhythm.  Pulmonary:     Effort: Pulmonary effort is normal.     Breath sounds: Normal breath sounds.  Abdominal:     General: Abdomen is flat.     Palpations: Abdomen is soft.  Musculoskeletal:        General: Normal range of motion.  Skin:    General: Skin is warm and dry.  Neurological:     General: No focal deficit present.     Mental Status: She is alert. Mental status is at baseline.  Psychiatric:        Attention and Perception: Attention normal.        Mood and Affect: Mood normal. Affect is blunt.        Speech: Speech normal.        Behavior: Behavior is cooperative.        Thought Content: Thought content normal.        Cognition and Memory: Cognition normal.    Review of Systems  Constitutional: Negative.  HENT:  Positive for hearing loss and tinnitus.   Eyes: Negative.   Respiratory: Negative.    Cardiovascular: Negative.   Gastrointestinal: Negative.   Musculoskeletal: Negative.   Skin: Negative.   Neurological:  Positive for headaches.  Psychiatric/Behavioral:  Positive for depression. Negative for hallucinations and suicidal ideas. The patient is nervous/anxious.    Blood pressure 104/73, pulse 87, temperature 98.2 F (36.8 C), temperature source Oral, resp. rate  16, height 5\' 2"  (1.575 m), weight 58.5 kg, last menstrual period 12/24/2021, SpO2 100 %. Body mass index is 23.59 kg/m.   Treatment Plan Summary: Medication management and Plan no change today to medication management.  I reviewed her chart notes.  I do not see any obvious "smoking gun" to explain the hearing loss.  Nose single medication that would be a well-known source of ototoxicity.  Possible that it could be related to the extended anoxemia.  Spoke with neurology today and they recommended repeat MRI.  Audiology consult pending if possible.  No change in antidepressants.  Work with social work on finding rehab facilities  12/26/2021, MD 01/23/2022, 11:20 AM

## 2022-01-24 MED ORDER — ACYCLOVIR 5 % EX OINT
TOPICAL_OINTMENT | CUTANEOUS | Status: DC
  Administered 2022-01-24 – 2022-01-27 (×4): 1 via TOPICAL
  Filled 2022-01-24 (×2): qty 15

## 2022-01-24 NOTE — Progress Notes (Signed)
Recreation Therapy Notes  Date: 01/24/2022  Time: 10:25 am    Location: Craft room     Behavioral response: N/A   Intervention Topic: Creative expressions   Discussion/Intervention: Patient refused to attend group.   Clinical Observations/Feedback:  Patient refused to attend group.    Manon Banbury LRT/CTRS         Zayden Maffei 01/24/2022 12:28 PM

## 2022-01-24 NOTE — Progress Notes (Signed)
Patient was cooperative on shift with treatment and medications.  She denies SI, HI & AVH. She is still endorsing depression and anxiety.  She did go down for MRI around midnight, I was told by writer MRI was completed without contrast due to MRI staff having difficulties getting IV placed for the patient.

## 2022-01-24 NOTE — Progress Notes (Signed)
[facility] MD Progress Note  01/24/2022 2:53 PM Krisann Mckenna  MRN:  [id number] Subjective: Follow-up 26 year old woman with depression and recent suicide attempt and opiate abuse.  Patient reports that she is not having active suicidal thoughts but continues to feel anxious and depressed.  Feels restless like she "does not know what to do next".  Social work is advised me that 1 reason she is not getting accepted to rehabs is that when they talk with her on the phone they cannot understand her because of her quiet voice.  I asked the patient if it was possible for her to speak more loudly and she claimed that it was not possible.  I spoke with speech therapy but they requested ENT take a look at it.  Patient is also continuing to complain of diminished hearing and tinnitus Principal Problem: Severe recurrent major depression without psychotic features (HCC) Diagnosis: Principal Problem:   Severe recurrent major depression without psychotic features (HCC) Active Problems:   Opiate abuse, continuous (HCC)   History of ischemic stroke  Total Time spent with patient: 30 minutes  Past Psychiatric History: Past history of longstanding opiate abuse.  History of stroke.  Recent suicide attempt with extended period of anoxemia  Past Medical History:  Past Medical History:  Diagnosis Date   AKI (acute kidney injury) (HCC) 2018   "from overdose"   Anxiety    Chlamydia    Daily headache    Depression    Drug overdose 04/2016   [name] 05/01/2016   GERD (gastroesophageal reflux disease)    "when I was younger; gone now" (09/21/2017)   IV drug abuse (HCC) 09/20/2017   Migraine    "q couple weeks" (09/21/2017)   Opioid abuse (HCC)    Overdose    Stroke [facility])     Past Surgical History:  Procedure Laterality Date   APPENDECTOMY  04/2013   I & D EXTREMITY Left 09/20/2017   Procedure: IRRIGATION AND DEBRIDEMENT LEFT ARM;  Surgeon: Bradly Bienenstock, MD;  Location: [facility] OR;  Service: Orthopedics;  Laterality:  Left;   TEE WITHOUT CARDIOVERSION N/A 02/10/2019   Procedure: TRANSESOPHAGEAL ECHOCARDIOGRAM (TEE);  Surgeon: Debbe Odea, MD;  Location: [facility] ORS;  Service: Cardiovascular;  Laterality: N/A;  Polysubstance abuser   TONGUE SURGERY  ~ 2007   "related to lisp"   TRACHEOSTOMY  04/2016   [name] 05/01/2016   Family History:  Family History  Problem Relation Age of Onset   Anemia Father    Healthy Sister    Thyroid cancer Maternal Grandmother    Lung cancer Maternal Grandfather        metastasized   Lung cancer Paternal Grandfather    Family Psychiatric  History: See previous Social History:  Social History   Substance and Sexual Activity  Alcohol Use No     Social History   Substance and Sexual Activity  Drug Use Yes   Types: IV, Heroin, Cocaine, Marijuana   Comment: last use of heroin and cocaine was 01/2019, marijuana daily    Social History   Socioeconomic History   Marital status: Widowed    Spouse name: Not on file   Number of children: Not on file   Years of education: high school   Highest education level: Not on file  Occupational History   Not on file  Tobacco Use   Smoking status: Every Day    Packs/day: 0.50    Years: 10.00    Total pack years: 5.00    Types:  Cigarettes   Smokeless tobacco: Never  Vaping Use   Vaping Use: Never used  Substance and Sexual Activity   Alcohol use: No   Drug use: Yes    Types: IV, Heroin, Cocaine, Marijuana    Comment: last use of heroin and cocaine was 01/2019, marijuana daily   Sexual activity: Yes    Birth control/protection: I.U.D.    Comment: Paragard  Other Topics Concern   Not on file  Social History Narrative   05/24/19   From: [place], [place]   Living: with boyfriend [name]([name]) -- former husband died from overdose   Work: not currently, in the application process for disability      Family: good relationship with dad who lives in [place], mom is in [place]. [place] relationship with bother and sister       Enjoys: training her puppy (corndog)      Exercise: walking, exercise bike in the apartment   Diet: not the best, tries to cook at home, fruit/veggies      Safety   Seat belts: Yes    Guns: No   Safe in relationships: Yes    Social Determinants of Corporate investment bankerHealth   Financial Resource Strain: Not on file  Food Insecurity: No Food Insecurity (01/19/2022)   Hunger Vital Sign    Worried About Running Out of Food in the Last Year: Never true    Ran Out of Food in the Last Year: Never true  Transportation Needs: No Transportation Needs (01/19/2022)   PRAPARE - Administrator, Civil ServiceTransportation    Lack of Transportation (Medical): No    Lack of Transportation (Non-Medical): No  Physical Activity: Not on file  Stress: Not on file  Social Connections: Not on file   Additional Social History:                         Sleep: Fair  Appetite:  Fair  Current Medications: Current Facility-Administered Medications  Medication Dose Route Frequency Provider Last Rate Last Admin   acetaminophen (TYLENOL) tablet 650 mg  650 mg Oral Q6H PRN Avanelle Pixley T, MD   650 mg at 01/21/22 0850   alum & mag hydroxide-simeth (MAALOX/MYLANTA) 200-200-20 MG/5ML suspension 30 mL  30 mL Oral Q4H PRN Elizabeth Haff, Jackquline DenmarkJohn T, MD       baclofen (LIORESAL) tablet 10 mg  10 mg Oral TID Lorrane Mccay T, MD   10 mg at 01/24/22 1239   busPIRone (BUSPAR) tablet 10 mg  10 mg Oral TID Marcia Lepera T, MD   10 mg at 01/24/22 1239   docusate sodium (COLACE) capsule 100 mg  100 mg Oral BID Amada Hallisey T, MD   100 mg at 01/24/22 [id number]   gabapentin (NEURONTIN) capsule 300 mg  300 mg Oral QHS Veleka Djordjevic T, MD   300 mg at 01/23/22 2054   gadobutrol (GADAVIST) 1 MMOL/ML injection 5 mL  5 mL Intravenous Once PRN Catalina Salasar, Jackquline DenmarkJohn T, MD       hydrOXYzine (ATARAX) tablet 50 mg  50 mg Oral Q6H PRN Avice Funchess T, MD   50 mg at 01/22/22 1751   lamoTRIgine (LAMICTAL) tablet 25 mg  25 mg Oral BID Alera Quevedo T, MD   25 mg at 01/24/22 [id number]   magnesium  hydroxide (MILK OF MAGNESIA) suspension 30 mL  30 mL Oral Daily PRN Mieka Leaton, Jackquline DenmarkJohn T, MD       menthol-cetylpyridinium (CEPACOL) lozenge 3 mg  1 lozenge Oral PRN Nedim Oki, Jackquline DenmarkJohn T, MD  nicotine (NICODERM CQ - dosed in mg/24 hours) patch 14 mg  14 mg Transdermal Daily Kylie Simmonds T, MD   14 mg at 01/24/22 0904   ondansetron (ZOFRAN-ODT) disintegrating tablet 4 mg  4 mg Oral Q8H PRN Gillermo Murdoch, NP   4 mg at 01/20/22 2232   OXcarbazepine (TRILEPTAL) tablet 150 mg  150 mg Oral BID Betrice Wanat, [name], MD   150 mg at 01/24/22 5537   QUEtiapine (SEROQUEL) tablet 50 mg  50 mg Oral QHS Jerrilynn Mikowski, [name], MD   50 mg at 01/23/22 2054    Lab Results: No results found for this or any previous visit (from the past 48 hour(s)).  Blood Alcohol level:  Lab Results  Component Value Date   ETH <10 04/29/2021   ETH <10 02/07/2019    Metabolic Disorder Labs: Lab Results  Component Value Date   HGBA1C 5.4 01/20/2022   MPG 108 01/20/2022   MPG 96.8 02/07/2019   No results found for: "PROLACTIN" Lab Results  Component Value Date   CHOL 192 01/21/2022   TRIG 89 01/21/2022   HDL 56 01/21/2022   CHOLHDL 3.4 01/21/2022   VLDL 18 01/21/2022   LDLCALC 118 (H) 01/21/2022   LDLCALC 67 11/05/2020    Physical Findings: AIMS:  , ,  ,  ,    CIWA:    COWS:     Musculoskeletal: Strength & Muscle Tone: within normal limits Gait & Station:  Limps because of weakness on the left side Patient leans: Left  Psychiatric Specialty Exam:  Presentation  General Appearance:  Appropriate for Environment  Eye Contact: Good  Speech: Clear and Coherent; Normal Rate  Speech Volume: Normal  Handedness: Right   Mood and Affect  Mood: Euthymic  Affect: Appropriate; Congruent   Thought Process  Thought Processes: Coherent; Goal Directed  Descriptions of Associations:Intact  Orientation:Full (Time, Place and Person)  Thought Content:No data recorded History of  Schizophrenia/Schizoaffective disorder:No data recorded Duration of Psychotic Symptoms:No data recorded Hallucinations:No data recorded Ideas of Reference:None  Suicidal Thoughts:No data recorded Homicidal Thoughts:No data recorded  Sensorium  Memory: Immediate Good; Recent Good  Judgment: Intact  Insight: Present   Executive Functions  Concentration: Good  Attention Span: Good  Recall: Good  Fund of Knowledge: Good  Language: Good   Psychomotor Activity  Psychomotor Activity:No data recorded  Assets  Assets: Communication Skills; Desire for Improvement; Housing; Health and safety inspector; Social Support; Physical Health   Sleep  Sleep:No data recorded   Physical Exam: Physical Exam Vitals and nursing note reviewed.  Constitutional:      Appearance: Normal appearance.  HENT:     Head: Normocephalic and atraumatic.     Mouth/Throat:     Pharynx: Oropharynx is clear.  Eyes:     Pupils: Pupils are equal, round, and reactive to light.  Cardiovascular:     Rate and Rhythm: Normal rate and regular rhythm.  Pulmonary:     Effort: Pulmonary effort is normal.     Breath sounds: Normal breath sounds.  Abdominal:     General: Abdomen is flat.     Palpations: Abdomen is soft.  Musculoskeletal:        General: Normal range of motion.  Skin:    General: Skin is warm and dry.  Neurological:     General: No focal deficit present.     Mental Status: She is alert. Mental status is at baseline.     Comments: Left-sided weakness both in terms of the lymph and  her left arm.  Chronic.  Psychiatric:        Attention and Perception: Attention normal.        Mood and Affect: Mood normal. Affect is blunt.        Speech: Speech is delayed.        Behavior: Behavior is slowed.        Thought Content: Thought content normal. Thought content does not include homicidal or suicidal ideation.    Review of Systems  Constitutional: Negative.   HENT:  Positive for  hearing loss and tinnitus.   Eyes: Negative.   Respiratory: Negative.    Cardiovascular: Negative.   Gastrointestinal: Negative.   Musculoskeletal: Negative.   Skin: Negative.   Neurological:  Positive for weakness.  Psychiatric/Behavioral:  Positive for depression. Negative for hallucinations and suicidal ideas. The patient is nervous/anxious.    Blood pressure 104/68, pulse 86, temperature 98.3 F (36.8 C), temperature source Oral, resp. rate 17, height 5\' 2"  (1.575 m), weight 58.5 kg, last menstrual period 12/24/2021, SpO2 99 %. Body mass index is 23.59 kg/m.   Treatment Plan Summary: Medication management and Plan no change to psychiatric medicine.  I spoke with ENT today.  It is outside my range of knowledge to comment how likely it is that her hearing or vocal problems have a physiologic cause so I very much appreciate them taking a look at her.  Keep encouraging patient to participate with staff and trying to find some sort of rehab.  12/26/2021, MD 01/24/2022, 2:53 PM

## 2022-01-24 NOTE — Group Note (Signed)
[facility] LCSW Group Therapy Note   Group Date: 01/24/2022 Start Time: 1300 End Time: 1400  Type of Therapy and Topic:  Group Therapy:  Feelings around Relapse and Recovery  Participation Level:  Minimal    Description of Group:    Patients in this group will discuss emotions they experience before and after a relapse. They will process how experiencing these feelings, or avoidance of experiencing them, relates to having a relapse. Facilitator will guide patients to explore emotions they have related to recovery. Patients will be encouraged to process which emotions are more powerful. They will be guided to discuss the emotional reaction significant others in their lives may have to patients' relapse or recovery. Patients will be assisted in exploring ways to respond to the emotions of others without this contributing to a relapse.  Therapeutic Goals: Patient will identify two or more emotions that lead to relapse for them:  Patient will identify two emotions that result when they relapse:  Patient will identify two emotions related to recovery:  Patient will demonstrate ability to communicate their needs through discussion and/or role plays.   Summary of Patient Progress: Patient was present for the entirety of the group process. She identified relapse as a part of the recovery process oftentimes. Pt shared that she is trying to stop going around certain people, clarifying further that some of these are her family members. Although, her involvement in group was minimal she appeared to attend to the conversation.   Therapeutic Modalities:   Cognitive Behavioral Therapy Solution-Focused Therapy Assertiveness Training Relapse Prevention Therapy   Glenis Smoker, LCSW

## 2022-01-24 NOTE — Plan of Care (Signed)
D- Patient alert and oriented. Patient presented in a sad, but pleasant mood on assessment stating that she slept "good" last night and had no complaints to voice to this Clinical research associate. Patient endorsed both depression and anxiety, stating that "just being stuck in here", is why she feels this way. Patient denied SI, HI, AVH, and pain at this time. Per her self-inventory, patient's stated goal for today is to work on her discharge plan.  A- Scheduled medications administered to patient, per MD orders. Support and encouragement provided.  Routine safety checks conducted every 15 minutes.  Patient informed to notify staff with problems or concerns.  R- No adverse drug reactions noted. Patient contracts for safety at this time. Patient compliant with medications and treatment plan. Patient receptive, calm, and cooperative. Patient interacts well with others on the unit. Patient remains safe at this time.  Problem: Education: Goal: Knowledge of General Education information will improve Description: Including pain rating scale, medication(s)/side effects and non-pharmacologic comfort measures Outcome: Progressing   Problem: Health Behavior/Discharge Planning: Goal: Ability to manage health-related needs will improve Outcome: Progressing   Problem: Clinical Measurements: Goal: Ability to maintain clinical measurements within normal limits will improve Outcome: Progressing Goal: Will remain free from infection Outcome: Progressing Goal: Diagnostic test results will improve Outcome: Progressing Goal: Respiratory complications will improve Outcome: Progressing Goal: Cardiovascular complication will be avoided Outcome: Progressing   Problem: Activity: Goal: Risk for activity intolerance will decrease Outcome: Progressing   Problem: Nutrition: Goal: Adequate nutrition will be maintained Outcome: Progressing   Problem: Coping: Goal: Level of anxiety will decrease Outcome: Progressing   Problem:  Elimination: Goal: Will not experience complications related to bowel motility Outcome: Progressing Goal: Will not experience complications related to urinary retention Outcome: Progressing   Problem: Pain Managment: Goal: General experience of comfort will improve Outcome: Progressing   Problem: Safety: Goal: Ability to remain free from injury will improve Outcome: Progressing   Problem: Skin Integrity: Goal: Risk for impaired skin integrity will decrease Outcome: Progressing   Problem: Education: Goal: Ability to manage disease process will improve Outcome: Progressing   Problem: Cardiac: Goal: Ability to achieve and maintain adequate cardiopulmonary perfusion will improve Outcome: Progressing   Problem: Neurologic: Goal: Promote progressive neurologic recovery Outcome: Progressing   Problem: Skin Integrity: Goal: Risk for impaired skin integrity will be minimized. Outcome: Progressing

## 2022-01-24 NOTE — Progress Notes (Signed)
SLP Cancellation Note  Patient Details Name: Jane Watkins MRN: [id number] DOB: 1995/08/13   Cancelled treatment:       Reason Eval/Treat Not Completed: Other (comment)  Consult received for acute onset whispering. Secure chat sent to referring provided (Dr Toni Amend). Given acute onset, ENT consult and clearance required to assess for possible pathology prior to ST intervention.   Jane Watkins B. Dreama Saa, M.S., CCC-SLP, CBIS Speech-Language Pathologist Certified Brain Injury Specialist [facility] [phone] Ascom [phone] Fax [phone]  Jane Watkins 01/24/2022, 11:52 AM

## 2022-01-25 NOTE — Progress Notes (Signed)
[facility] MD Progress Note  01/25/2022 2:55 PM Jane Watkins  MRN:  [id number] Subjective: Jane Watkins is seen on rounds.  She has no complaints.  She states that she is doing fine.  Been compliant with medication and denies any side effects.  Mood and affect are stable. Principal Problem: Severe recurrent major depression without psychotic features (HCC) Diagnosis: Principal Problem:   Severe recurrent major depression without psychotic features (HCC) Active Problems:   Opiate abuse, continuous (HCC)   History of ischemic stroke  Total Time spent with patient: 15 minutes  Past Psychiatric History: Past history of chronic substance use and a history of stroke   Past Medical History:  Past Medical History:  Diagnosis Date   AKI (acute kidney injury) (HCC) 2018   "from overdose"   Anxiety    Chlamydia    Daily headache    Depression    Drug overdose 04/2016   [name] 05/01/2016   GERD (gastroesophageal reflux disease)    "when I was younger; gone now" (09/21/2017)   IV drug abuse (HCC) 09/20/2017   Migraine    "q couple weeks" (09/21/2017)   Opioid abuse (HCC)    Overdose    Stroke [facility])     Past Surgical History:  Procedure Laterality Date   APPENDECTOMY  04/2013   I & D EXTREMITY Left 09/20/2017   Procedure: IRRIGATION AND DEBRIDEMENT LEFT ARM;  Surgeon: Bradly Bienenstock, MD;  Location: [facility] OR;  Service: Orthopedics;  Laterality: Left;   TEE WITHOUT CARDIOVERSION N/A 02/10/2019   Procedure: TRANSESOPHAGEAL ECHOCARDIOGRAM (TEE);  Surgeon: Debbe Odea, MD;  Location: [facility] ORS;  Service: Cardiovascular;  Laterality: N/A;  Polysubstance abuser   TONGUE SURGERY  ~ 2007   "related to lisp"   TRACHEOSTOMY  04/2016   [name] 05/01/2016   Family History:  Family History  Problem Relation Age of Onset   Anemia Father    Healthy Sister    Thyroid cancer Maternal Grandmother    Lung cancer Maternal Grandfather        metastasized   Lung cancer Paternal Grandfather     Social History:   Social History   Substance and Sexual Activity  Alcohol Use No     Social History   Substance and Sexual Activity  Drug Use Yes   Types: IV, Heroin, Cocaine, Marijuana   Comment: last use of heroin and cocaine was 01/2019, marijuana daily    Social History   Socioeconomic History   Marital status: Widowed    Spouse name: Not on file   Number of children: Not on file   Years of education: high school   Highest education level: Not on file  Occupational History   Not on file  Tobacco Use   Smoking status: Every Day    Packs/day: 0.50    Years: 10.00    Total pack years: 5.00    Types: Cigarettes   Smokeless tobacco: Never  Vaping Use   Vaping Use: Never used  Substance and Sexual Activity   Alcohol use: No   Drug use: Yes    Types: IV, Heroin, Cocaine, Marijuana    Comment: last use of heroin and cocaine was 01/2019, marijuana daily   Sexual activity: Yes    Birth control/protection: I.U.D.    Comment: Paragard  Other Topics Concern   Not on file  Social History Narrative   05/24/19   From: [name], [place]   Living: with boyfriend [name]) -- former husband died from overdose   Work: not  currently, in the application process for disability      Family: good relationship with dad who lives in [place], mom is in [place]. [place] relationship with bother and sister      Enjoys: training her puppy (corndog)      Exercise: walking, exercise bike in the apartment   Diet: not the best, tries to cook at home, fruit/veggies      Safety   Seat belts: Yes    Guns: No   Safe in relationships: Yes    Social Determinants of Health   Financial Resource Strain: Not on file  Food Insecurity: No Food Insecurity (01/19/2022)   Hunger Vital Sign    Worried About Running Out of Food in the Last Year: Never true    Ran Out of Food in the Last Year: Never true  Transportation Needs: No Transportation Needs (01/19/2022)   PRAPARE - Administrator, Civil Service (Medical):  No    Lack of Transportation (Non-Medical): No  Physical Activity: Not on file  Stress: Not on file  Social Connections: Not on file   Additional Social History:                         Sleep: Good  Appetite:  Good  Current Medications: Current Facility-Administered Medications  Medication Dose Route Frequency Provider Last Rate Last Admin   acetaminophen (TYLENOL) tablet 650 mg  650 mg Oral Q6H PRN Clapacs, [name], MD   650 mg at 01/21/22 0850   acyclovir ointment (ZOVIRAX) 5 %   Topical Q3H Clapacs, [name], MD   Given at 01/25/22 1138   alum & mag hydroxide-simeth (MAALOX/MYLANTA) 200-200-20 MG/5ML suspension 30 mL  30 mL Oral Q4H PRN Clapacs, [name], MD       baclofen (LIORESAL) tablet 10 mg  10 mg Oral TID Clapacs, [name], MD   10 mg at 01/25/22 1138   busPIRone (BUSPAR) tablet 10 mg  10 mg Oral TID Clapacs, [name], MD   10 mg at 01/25/22 1138   docusate sodium (COLACE) capsule 100 mg  100 mg Oral BID Clapacs, [name], MD   100 mg at 01/25/22 0830   gabapentin (NEURONTIN) capsule 300 mg  300 mg Oral QHS Clapacs, [name], MD   300 mg at 01/24/22 2105   gadobutrol (GADAVIST) 1 MMOL/ML injection 5 mL  5 mL Intravenous Once PRN Clapacs, [name], MD       hydrOXYzine (ATARAX) tablet 50 mg  50 mg Oral Q6H PRN Clapacs, [name], MD   50 mg at 01/24/22 2108   lamoTRIgine (LAMICTAL) tablet 25 mg  25 mg Oral BID Clapacs, [name], MD   25 mg at 01/25/22 0830   magnesium hydroxide (MILK OF MAGNESIA) suspension 30 mL  30 mL Oral Daily PRN Clapacs, [name], MD       menthol-cetylpyridinium (CEPACOL) lozenge 3 mg  1 lozenge Oral PRN Clapacs, [name], MD       nicotine (NICODERM CQ - dosed in mg/24 hours) patch 14 mg  14 mg Transdermal Daily Clapacs, [name], MD   14 mg at 01/25/22 0831   ondansetron (ZOFRAN-ODT) disintegrating tablet 4 mg  4 mg Oral Q8H PRN Gillermo Murdoch, NP   4 mg at 01/20/22 2232   OXcarbazepine (TRILEPTAL) tablet 150 mg  150 mg Oral BID Clapacs, [name], MD   150 mg at  01/25/22 0831   QUEtiapine (SEROQUEL) tablet 50  mg  50 mg Oral QHS Clapacs, [name], MD   50 mg at 01/24/22 2105    Lab Results: No results found for this or any previous visit (from the past 48 hour(s)).  Blood Alcohol level:  Lab Results  Component Value Date   ETH <10 04/29/2021   ETH <10 02/07/2019    Metabolic Disorder Labs: Lab Results  Component Value Date   HGBA1C 5.4 01/20/2022   MPG 108 01/20/2022   MPG 96.8 02/07/2019   No results found for: "PROLACTIN" Lab Results  Component Value Date   CHOL 192 01/21/2022   TRIG 89 01/21/2022   HDL 56 01/21/2022   CHOLHDL 3.4 01/21/2022   VLDL 18 01/21/2022   LDLCALC 118 (H) 01/21/2022   LDLCALC 67 11/05/2020    Physical Findings: AIMS: Facial and Oral Movements Muscles of Facial Expression: None, normal Lips and Perioral Area: None, normal Jaw: None, normal Tongue: None, normal,Extremity Movements Upper (arms, wrists, hands, fingers): None, normal Lower (legs, knees, ankles, toes): None, normal, Trunk Movements Neck, shoulders, hips: None, normal, Overall Severity Severity of abnormal movements (highest score from questions above): None, normal Incapacitation due to abnormal movements: None, normal Patient's awareness of abnormal movements (rate only patient's report): No Awareness, Dental Status Current problems with teeth and/or dentures?: No Does patient usually wear dentures?: No  CIWA:    COWS:     Musculoskeletal: Strength & Muscle Tone: within normal limits Gait & Station: normal Patient leans: N/A  Psychiatric Specialty Exam:  Presentation  General Appearance:  Appropriate for Environment  Eye Contact: Good  Speech: Clear and Coherent; Normal Rate  Speech Volume: Normal  Handedness: Right   Mood and Affect  Mood: Euthymic  Affect: Appropriate; Congruent   Thought Process  Thought Processes: Coherent; Goal Directed  Descriptions of Associations:Intact  Orientation:Full (Time,  Place and Person)  Thought Content:No data recorded History of Schizophrenia/Schizoaffective disorder:No data recorded Duration of Psychotic Symptoms:No data recorded Hallucinations:No data recorded Ideas of Reference:None  Suicidal Thoughts:No data recorded Homicidal Thoughts:No data recorded  Sensorium  Memory: Immediate Good; Recent Good  Judgment: Intact  Insight: Present   Executive Functions  Concentration: Good  Attention Span: Good  Recall: Good  Fund of Knowledge: Good  Language: Good   Psychomotor Activity  Psychomotor Activity:No data recorded  Assets  Assets: Communication Skills; Desire for Improvement; Housing; Health and safety inspector; Social Support; Physical Health   Sleep  Sleep:No data recorded    Blood pressure 115/85, pulse (!) 127, temperature 97.8 F (36.6 C), temperature source Oral, resp. rate 18, height 5\' 2"  (1.575 m), weight 58.5 kg, last menstrual period 12/24/2021, SpO2 98 %. Body mass index is 23.59 kg/m.   Treatment Plan Summary: Daily contact with patient to assess and evaluate symptoms and progress in treatment, Medication management, and Plan continue current medications.  12/26/2021, DO 01/25/2022, 2:55 PM

## 2022-01-25 NOTE — Op Note (Signed)
01/25/2022  12:56 PM    Dovey, Fatzinger  [id number]   Pre-Op Dx: Dysphonia, with a weak voice  Post-op Dx: Same  Proc: Flexible laryngoscopy  Surg:  Beverly Sessions Zonie Crutcher  Anes: Topical  EBL: None  Comp: None  Findings: Normal-appearing vocal cords with good mobility  Procedure: The patient was seen at the bedside in her room.  The nose was sprayed with mixture of 2% lidocaine mixed with oxymetazoline.  This was allowed to sit for a couple minutes while the scope was prepped.  Scope was lubricated and frog reduction liquid was placed on the tip.  The flexible scope was used to visualize the anterior nasal cavity on both sides.  The left side appeared to be slightly more open.  The scope was passed easily through the left nostril without any significant discomfort to the patient.  I was able to pass behind the soft palate to visualize the vocal cords.   The entire nose nasopharynx was clear.  The hypopharynx showed normal anatomy and no redness or irritation.  Tongue base look normal.  The epiglottis was elevated and the vocal cords could be easily seen.  Cords are pearly white and appeared normal.  No lesions or nodules on the cords.  Cords move well to the midline and open widely.  Subglottic space was clear.  There were no lesions in the larynx or hypopharynx.  She was handling her secretions very well and there is no secretions pooling anywhere.  Dispo:   Patient tolerated the procedure well.  There is no postop care necessary  Plan: Patient has weakness of her vocal cords but no pathology noted.  She will be evaluated by speech therapy and start therapy for strengthening of her vocal muscles.  Beverly Sessions Styles Fambro  01/25/2022 12:56 PM

## 2022-01-25 NOTE — Progress Notes (Signed)
Met with pt at pt's request. Pt was released from IVC on 01/23/2022 and pt is requesting to sign 72 hour form. RN notified. She shares she is still interested in residential substance use treatment however, would like to resolve her medical concerns with ENT and would like to stay with her mother over Christmas prior to SUD Tx. She reports she plans on reaching out to SUD facilities following Christmas for placement.       Darletta Moll MSW, LCSW Geneticist, molecular

## 2022-01-25 NOTE — Plan of Care (Signed)
Pt endorses anxiety however, denies depression at this time. Pt denies SI/HI/AVH or pain at this time. Pt is calm and cooperative. Pt is medication compliant. Pt provided with support and encouragement. Pt monitored q15 minutes for safety per unit policy. Plan of care ongoing.   Problem: Education: Goal: Knowledge of General Education information will improve Description: Including pain rating scale, medication(s)/side effects and non-pharmacologic comfort measures Outcome: Progressing   Problem: Coping: Goal: Level of anxiety will decrease Outcome: Not Progressing   

## 2022-01-25 NOTE — Progress Notes (Signed)
  Patient was cooperative on shift with treatment and medications.  She denies SI, HI & AVH. She is still endorsing depression and anxiety and she was given PRN x1 with effect. Patient was cooperative on treatment. No new issues to report on shift at this time.

## 2022-01-25 NOTE — BH IP Treatment Plan (Signed)
Interdisciplinary Treatment and Diagnostic Plan Update  01/25/2022 Time of Session: 11:15am Jane Watkins MRN: [id number]  Principal Diagnosis: Severe recurrent major depression without psychotic features [facility])  Secondary Diagnoses: Principal Problem:   Severe recurrent major depression without psychotic features ([place]) Active Problems:   Opiate abuse, continuous ([place])   History of ischemic stroke   Current Medications:  Current Facility-Administered Medications  Medication Dose Route Frequency Provider Last Rate Last Admin   acetaminophen (TYLENOL) tablet 650 mg  650 mg Oral Q6H PRN Clapacs, Madie Reno, MD   650 mg at 01/21/22 0850   acyclovir ointment (ZOVIRAX) 5 %   Topical Q3H Clapacs, Madie Reno, MD   Given at 01/25/22 0929   alum & mag hydroxide-simeth (MAALOX/MYLANTA) 200-200-20 MG/5ML suspension 30 mL  30 mL Oral Q4H PRN Clapacs, Madie Reno, MD       baclofen (LIORESAL) tablet 10 mg  10 mg Oral TID Clapacs, Madie Reno, MD   10 mg at 01/25/22 0830   busPIRone (BUSPAR) tablet 10 mg  10 mg Oral TID Clapacs, Madie Reno, MD   10 mg at 01/25/22 0830   docusate sodium (COLACE) capsule 100 mg  100 mg Oral BID Clapacs, [name], MD   100 mg at 01/25/22 0830   gabapentin (NEURONTIN) capsule 300 mg  300 mg Oral QHS Clapacs, [name], MD   300 mg at 01/24/22 2105   gadobutrol (GADAVIST) 1 MMOL/ML injection 5 mL  5 mL Intravenous Once PRN Clapacs, Madie Reno, MD       hydrOXYzine (ATARAX) tablet 50 mg  50 mg Oral Q6H PRN Clapacs, Madie Reno, MD   50 mg at 01/24/22 2108   lamoTRIgine (LAMICTAL) tablet 25 mg  25 mg Oral BID Clapacs, [name], MD   25 mg at 01/25/22 0830   magnesium hydroxide (MILK OF MAGNESIA) suspension 30 mL  30 mL Oral Daily PRN Clapacs, Madie Reno, MD       menthol-cetylpyridinium (CEPACOL) lozenge 3 mg  1 lozenge Oral PRN Clapacs, Madie Reno, MD       nicotine (NICODERM CQ - dosed in mg/24 hours) patch 14 mg  14 mg Transdermal Daily Clapacs, [name], MD   14 mg at 01/25/22 0831   ondansetron (ZOFRAN-ODT)  disintegrating tablet 4 mg  4 mg Oral Q8H PRN Caroline Sauger, NP   4 mg at 01/20/22 2232   OXcarbazepine (TRILEPTAL) tablet 150 mg  150 mg Oral BID Clapacs, Madie Reno, MD   150 mg at 01/25/22 0831   QUEtiapine (SEROQUEL) tablet 50 mg  50 mg Oral QHS Clapacs, Madie Reno, MD   50 mg at 01/24/22 2105   PTA Medications: Medications Prior to Admission  Medication Sig Dispense Refill Last Dose   acetaminophen (TYLENOL) 325 MG tablet Take 2 tablets (650 mg total) by mouth every 4 (four) hours as needed for mild pain (or temp > 37.5 C (99.5 F)).      baclofen (LIORESAL) 10 MG tablet Take 1 tablet (10 mg total) by mouth 3 (three) times daily. 90 tablet 1    busPIRone (BUSPAR) 10 MG tablet TAKE 1 TABLET BY MOUTH THREE TIMES A DAY (Patient taking differently: Take 10 mg by mouth 3 (three) times daily.) 270 tablet 2    gabapentin (NEURONTIN) 300 MG capsule Take 1 capsule (300 mg total) by mouth at bedtime. 90 capsule 3    lamoTRIgine (LAMICTAL) 25 MG tablet Take 1 tablet (25 mg total) by mouth 2 (two) times daily.  mirtazapine (REMERON) 15 MG tablet Take 15 mg by mouth at bedtime.      naloxone (NARCAN) nasal spray 4 mg/0.1 mL ADMINISTER A SINGLE SPRAY IN ONE NOSTRIL UPON SIGNS OF OPIOID OVERDOSE. REPEAT EVERY 2 - 3 MINUTES IF NO RESPONSE IN ALTERNATING NOSTRIL TIL EMS ARRIVES.      nicotine (NICODERM CQ - DOSED IN MG/24 HOURS) 21 mg/24hr patch Place 1 patch (21 mg total) onto the skin daily. 28 patch 3    OXcarbazepine (TRILEPTAL) 150 MG tablet Take 150 mg by mouth 2 (two) times daily.      QUEtiapine (SEROQUEL) 50 MG tablet Take 1 tablet (50 mg total) by mouth at bedtime. 60 tablet 3    SUMAtriptan (IMITREX) 50 MG tablet Take 1 tablet by mouth every 2 hours as needed for migraine. May repeat in 2 hours if headache persists or recurs. 10 tablet 0     Patient Stressors: Financial difficulties   Health problems   Substance abuse    Patient Strengths: Ability for insight  Active sense of humor   Capable of independent living   Treatment Modalities: Medication Management, Group therapy, Case management,  1 to 1 session with clinician, Psychoeducation, Recreational therapy.   Physician Treatment Plan for Primary Diagnosis: Severe recurrent major depression without psychotic features ([place]) Long Term Goal(s): Improvement in symptoms so as ready for discharge   Short Term Goals: Compliance with prescribed medications will improve Ability to identify triggers associated with substance abuse/mental health issues will improve Ability to verbalize feelings will improve Ability to disclose and discuss suicidal ideas Ability to demonstrate self-control will improve  Medication Management: Evaluate patient's response, side effects, and tolerance of medication regimen.  Therapeutic Interventions: 1 to 1 sessions, Unit Group sessions and Medication administration.  Evaluation of Outcomes: Progressing  Physician Treatment Plan for Secondary Diagnosis: Principal Problem:   Severe recurrent major depression without psychotic features ([place]) Active Problems:   Opiate abuse, continuous ([place])   History of ischemic stroke  Long Term Goal(s): Improvement in symptoms so as ready for discharge   Short Term Goals: Compliance with prescribed medications will improve Ability to identify triggers associated with substance abuse/mental health issues will improve Ability to verbalize feelings will improve Ability to disclose and discuss suicidal ideas Ability to demonstrate self-control will improve     Medication Management: Evaluate patient's response, side effects, and tolerance of medication regimen.  Therapeutic Interventions: 1 to 1 sessions, Unit Group sessions and Medication administration.  Evaluation of Outcomes: Progressing   RN Treatment Plan for Primary Diagnosis: Severe recurrent major depression without psychotic features ([place]) Long Term Goal(s): Knowledge of disease and  therapeutic regimen to maintain health will improve  Short Term Goals: Ability to remain free from injury will improve, Ability to verbalize frustration and anger appropriately will improve, Ability to demonstrate self-control, Ability to participate in decision making will improve, Ability to identify and develop effective coping behaviors will improve, and Compliance with prescribed medications will improve  Medication Management: RN will administer medications as ordered by provider, will assess and evaluate patient's response and provide education to patient for prescribed medication. RN will report any adverse and/or side effects to prescribing provider.  Therapeutic Interventions: 1 on 1 counseling sessions, Psychoeducation, Medication administration, Evaluate responses to treatment, Monitor vital signs and CBGs as ordered, Perform/monitor CIWA, COWS, AIMS and Fall Risk screenings as ordered, Perform wound care treatments as ordered.  Evaluation of Outcomes: Progressing   LCSW Treatment Plan for Primary Diagnosis: Severe recurrent major  depression without psychotic features [facility]) Long Term Goal(s): Safe transition to appropriate next level of care at discharge, Engage patient in therapeutic group addressing interpersonal concerns.  Short Term Goals: Engage patient in aftercare planning with referrals and resources, Increase social support, Increase ability to appropriately verbalize feelings, Increase emotional regulation, Identify triggers associated with mental health/substance abuse issues, and Increase skills for wellness and recovery  Therapeutic Interventions: Assess for all discharge needs, 1 to 1 time with Social worker, Explore available resources and support systems, Assess for adequacy in community support network, Educate family and significant other(s) on suicide prevention, Complete Psychosocial Assessment, Interpersonal group therapy.  Evaluation of Outcomes:  Progressing   Progress in Treatment: Attending groups: Yes. Participating in groups: Yes. Taking medication as prescribed: Yes. Toleration medication: Yes. Family/Significant other contact made: Yes, individuals contacted: SPE completed with patients mother Patient understands diagnosis: Yes. Discussing patient identified problems/goals with staff: Yes. Medical problems stabilized or resolved: Yes. Denies suicidal/homicidal ideation: Yes. Issues/concerns per patient self-inventory: No. Other: none   New problem(s) identified: No, Describe:  none Update 01/25/2022: No changes at this time.    New Short Term/Long Term Goal(s):  detox, medication management for mood stabilization; elimination of SI thoughts; development of comprehensive mental wellness/sobriety plan.  Update 01/25/2022: No changes at this time.    Patient Goals:  "medication management and maybe find somewhere else to fo for additional treatment" Update 01/25/2022: No changes at this time.    Discharge Plan or Barriers: CSW to assist patient in development of appropriate discharge plans.  Update 01/25/2022: Currently seeking residential SUD Tx.    Reason for Continuation of Hospitalization: Anxiety Depression Medical Issues Medication stabilization Suicidal ideation   Estimated Length of Stay:  1-7 days Update 01/25/2022: No changes at this time.   Last 3 [place] Suicide Severity Risk Score: Flowsheet Row Admission (Current) from [facility]19/20[facility] in [facility] INPATIENT [facility]ICINE ED to Hosp-Admission (Discharged) from 01/05/2022 in [facility] ED from 04/29/2021 in [facility] EMERGENCY DEPARTMENT  C-SSRS RISK CATEGORY No Risk No Risk No Risk       Last PHQ 2/9 Scores:    10/11/2021   11:14 AM 02/08/2020    2:50 PM 12/09/2019    1:12 PM  Depression screen PHQ 2/9  Decreased Interest 1 3 1   Down, Depressed, Hopeless 1 3   PHQ - 2 Score 2 6 1     Scribe for Treatment  Team: , LCSW 01/25/2022 11:18 AM

## 2022-01-25 NOTE — Progress Notes (Signed)
Pt denies SI/HI/AVH and verbally agrees to approach staff if these become apparent or before harming themselves/others. Rates depression 2/10. Rates anxiety 7/10. Rates pain 0/10.  Pt has been interacting with others. Pt still seems to be sad. ENT doctor came to see pt today and went well. Scheduled medications administered to pt, per MD orders. RN provided support and encouragement to pt. Q15 min safety checks implemented and continued. Pt safe on the unit. RN will continue to monitor and intervene as needed.  01/25/22 0830  Psych Admission Type (Psych Patients Only)  Admission Status Involuntary  Psychosocial Assessment  Patient Complaints Anxiety;Depression  Eye Contact Fair  Facial Expression Worried  Affect Anxious;Depressed;Sad  Speech Soft  Interaction Assertive  Motor Activity Slow;Shuffling;Unsteady  Appearance/Hygiene Unremarkable  Behavior Characteristics Cooperative;Appropriate to situation;Anxious  Mood Depressed;Anxious  Thought Process  Coherency Circumstantial  Content WDL  Delusions None reported or observed  Perception WDL  Hallucination None reported or observed  Judgment WDL  Confusion None  Danger to Self  Current suicidal ideation? Denies  Danger to Others  Danger to Others None reported or observed

## 2022-01-25 NOTE — Consult Note (Signed)
Jane Watkins, Biernat [id number] 16-Jul-1995 Clapacs, Jane [place], MD  Reason for Consult: Evaluate larynx because of hoarseness  HPI: The patient is a 26 year old white female who had an acute overdose in August and was intubated for 1 to 2 days for airway control.  When she was extubated she said her voice was very weak and has been that way ever since.  She had some discomfort for a couple of days after she was extubated but has not had any soreness since then.  She has not had any cough or hoarseness as much is just her voice is weak.  She has not had a cold or any drainage or any upper respiratory problems.  She is swallowing okay without difficulty or pain. She also complains of ringing in her ears, although we cannot get an audiogram obtained in the hospital here.  She has not had any ear pain or ear infections.  Allergies:  Allergies  Allergen Reactions   Pristiq [Desvenlafaxine Succinate Er] Rash   Desvenlafaxine Rash   Other Rash    Tide detergent  Tide detergent  Tide detergent    Sulfa Antibiotics Rash    ROS: Review of systems normal other than 12 systems except per HPI.  PMH:  Past Medical History:  Diagnosis Date   AKI (acute kidney injury) (HCC) 2018   "from overdose"   Anxiety    Chlamydia    Daily headache    Depression    Drug overdose 04/2016   [name] 05/01/2016   GERD (gastroesophageal reflux disease)    "when I was younger; gone now" (09/21/2017)   IV drug abuse (HCC) 09/20/2017   Migraine    "q couple weeks" (09/21/2017)   Opioid abuse (HCC)    Overdose    Stroke [facility])     FH:  Family History  Problem Relation Age of Onset   Anemia Father    Healthy Sister    Thyroid cancer Maternal Grandmother    Lung cancer Maternal Grandfather        metastasized   Lung cancer Paternal Grandfather     SH:  Social History   Socioeconomic History   Marital status: Widowed    Spouse name: Not on file   Number of children: Not on file   Years of education: high school    Highest education level: Not on file  Occupational History   Not on file  Tobacco Use   Smoking status: Every Day    Packs/day: 0.50    Years: 10.00    Total pack years: 5.00    Types: Cigarettes   Smokeless tobacco: Never  Vaping Use   Vaping Use: Never used  Substance and Sexual Activity   Alcohol use: No   Drug use: Yes    Types: IV, Heroin, Cocaine, Marijuana    Comment: last use of heroin and cocaine was 01/2019, marijuana daily   Sexual activity: Yes    Birth control/protection: I.U.D.    Comment: [employer]ag[employer]  Other Topics Concern   Not on file  Social History Narrative   05/24/19   From: [name], [place]   Living: with boyfriend [name]) -- former husband died from overdose   Work: not currently, in the application process for disability      Family: good relationship with dad who lives in [place], mom is in [place]. [place] relationship with bother and sister      Enjoys: training her puppy (corndog)      Exercise: walking, exercise bike in the  apartment   Diet: not the best, tries to cook at home, fruit/veggies      Safety   Seat belts: Yes    Guns: No   Safe in relationships: Yes    Social Determinants of Health   Financial Resource Strain: Not on file  Food Insecurity: No Food Insecurity (01/19/2022)   Hunger Vital Sign    Worried About Running Out of Food in the Last Year: Never true    Ran Out of Food in the Last Year: Never true  Transportation Needs: No Transportation Needs (01/19/2022)   PRAPARE - Administrator, Civil Service (Medical): No    Lack of Transportation (Non-Medical): No  Physical Activity: Not on file  Stress: Not on file  Social Connections: Not on file  Intimate Partner Violence: Not At Risk (01/19/2022)   Humiliation, Afraid, Rape, and Kick questionnaire    Fear of Current or Ex-Partner: No    Emotionally Abused: No    Physically Abused: No    Sexually Abused: No    PSH:  Past Surgical History:  Procedure Laterality Date    APPENDECTOMY  04/2013   I & D EXTREMITY Left 09/20/2017   Procedure: IRRIGATION AND DEBRIDEMENT LEFT ARM;  Surgeon: Bradly Bienenstock, MD;  Location: [facility] OR;  Service: Orthopedics;  Laterality: Left;   TEE WITHOUT CARDIOVERSION N/A 02/10/2019   Procedure: TRANSESOPHAGEAL ECHOCARDIOGRAM (TEE);  Surgeon: Debbe Odea, MD;  Location: [facility] ORS;  Service: Cardiovascular;  Laterality: N/A;  Polysubstance abuser   TONGUE SURGERY  ~ 2007   "related to lisp"   TRACHEOSTOMY  04/2016   [name] 05/01/2016    Physical  Exam: The patient's nose is open and clear with no sign of any infection inflammation.  Her oropharynx is clear.  A flexible laryngoscopy is done at the bedside and this is dictated in detail elsewhere.  Her vocal cords look clear and move well to the midline.  The open widely.  There is no clinical abnormalities noted on exam.   A/P: Patient has weakness of her voice but no pathology noted.  Her vocal cords look normal.  She needs speech therapy to help her to strengthen her cords and use her muscles better to project her voice.  When she is discharged she can make an appointment to see a ear nose and throat doctor as an outpatient and get an audiogram done at that time.   Beverly Sessions Virjean Boman 01/25/2022 12:49 PM

## 2022-01-26 NOTE — Progress Notes (Signed)
Pt did not receive 0000 and 0300 dose of acyclovir ointment. Pt was unavailable sleeping and not waking for dose, MD notified.

## 2022-01-26 NOTE — Plan of Care (Signed)
  Problem: Education: Goal: Knowledge of General Education information will improve Description: Including pain rating scale, medication(s)/side effects and non-pharmacologic comfort measures Outcome: Progressing   Problem: Health Behavior/Discharge Planning: Goal: Ability to manage health-related needs will improve Outcome: Progressing   Problem: Clinical Measurements: Goal: Ability to maintain clinical measurements within normal limits will improve Outcome: Progressing Goal: Will remain free from infection Outcome: Progressing Goal: Diagnostic test results will improve Outcome: Progressing Goal: Respiratory complications will improve Outcome: Progressing Goal: Cardiovascular complication will be avoided Outcome: Progressing   Problem: Skin Integrity: Goal: Risk for impaired skin integrity will be minimized. Outcome: Progressing   Problem: Neurologic: Goal: Promote progressive neurologic recovery Outcome: Progressing   Problem: Cardiac: Goal: Ability to achieve and maintain adequate cardiopulmonary perfusion will improve Outcome: Progressing   Problem: Education: Goal: Ability to manage disease process will improve Outcome: Progressing

## 2022-01-26 NOTE — [facility] Group Notes (Signed)
[facility] Group Notes:  (Nursing/MHT/Case Management/Adjunct)  Date:  01/26/2022  Time:  9:43 AM  Type of Therapy:   community meeting  Participation Level:  Did Not Attend    Rodena Goldmann 01/26/2022, 9:43 AM

## 2022-01-26 NOTE — Progress Notes (Signed)
Patient has been isolative to her room this evening.  She has been in her room reading and book since the beginning of the shift.  She is pleasant and cooperative. Med compliant and took meds without issue. She denies si  hi  avh depression and anxiety. Q15 minute safety checks in place.  Encouraged her to come to nurses station with any concerns she may have.  C Butler-Nicholson, LPN

## 2022-01-26 NOTE — Plan of Care (Signed)
  Problem: Nutrition: Goal: Adequate nutrition will be maintained Outcome: Progressing   Problem: Coping: Goal: Level of anxiety will decrease Outcome: Progressing   

## 2022-01-26 NOTE — Progress Notes (Signed)
Pt denies SI/HI/AVH and verbally agrees to approach staff if these become apparent or before harming themselves/others. Rates depression 0/10. Rates anxiety 0/10. Rates pain 0/10. Pt did not come out of her room for breakfast or meds this morning. Pt seemed to be more tired today. Pt came out of her in the afternoon and has been out for most of the rest of the day. Scheduled medications administered to pt, per MD orders. RN provided support and encouragement to pt. Q15 min safety checks implemented and continued. Pt safe on the unit. RN will continue to monitor and intervene as needed.  01/26/22 0924  Psych Admission Type (Psych Patients Only)  Admission Status Involuntary  Psychosocial Assessment  Patient Complaints None  Eye Contact Fair  Facial Expression Sad  Affect Sad  Speech Soft  Interaction Isolative  Motor Activity Slow;Shuffling;Unsteady  Appearance/Hygiene Unremarkable;In scrubs  Behavior Characteristics Cooperative;Appropriate to situation;Calm  Mood Sad;Pleasant  Thought Process  Coherency Circumstantial  Content WDL  Delusions None reported or observed  Perception WDL  Hallucination None reported or observed  Judgment WDL  Confusion None  Danger to Self  Current suicidal ideation? Denies  Danger to Others  Danger to Others None reported or observed

## 2022-01-26 NOTE — Progress Notes (Signed)
[facility] MD Progress Note  01/26/2022 12:37 PM Jane Watkins  MRN:  [id number] Subjective: Jane Watkins is seen on rounds.  She has been compliant with medications.  No side effects.  She has no complaints and no issues. Principal Problem: Severe recurrent major depression without psychotic features ([place]) Diagnosis: Principal Problem:   Severe recurrent major depression without psychotic features ([place]) Active Problems:   Opiate abuse, continuous ([place])   History of ischemic stroke  Total Time spent with patient: 15 minutes  Past Psychiatric History: Past history of chronic substance use and a history of stroke     Past Medical History:  Past Medical History:  Diagnosis Date   AKI (acute kidney injury) ([place]) 2018   "from overdose"   Anxiety    Chlamydia    Daily headache    Depression    Drug overdose 04/2016   [name] 05/01/2016   GERD (gastroesophageal reflux disease)    "when I was younger; gone now" (09/21/2017)   IV drug abuse ([place]) 09/20/2017   Migraine    "q couple weeks" (09/21/2017)   Opioid abuse ([place])    Overdose    Stroke [facility])     Past Surgical History:  Procedure Laterality Date   APPENDECTOMY  04/2013   I & D EXTREMITY Left 09/20/2017   Procedure: IRRIGATION AND DEBRIDEMENT LEFT ARM;  Surgeon: [name], MD;  Location: [place];  Service: Orthopedics;  Laterality: Left;   TEE WITHOUT CARDIOVERSION N/A 02/10/2019   Procedure: TRANSESOPHAGEAL ECHOCARDIOGRAM (TEE);  Surgeon: [name], MD;  Location: [facility];  Service: Cardiovascular;  Laterality: N/A;  Polysubstance abuser   TONGUE SURGERY  ~ 2007   "related to lisp"   TRACHEOSTOMY  04/2016   [name] 05/01/2016   Family History:  Family History  Problem Relation Age of Onset   Anemia Father    Healthy Sister    Thyroid cancer Maternal Grandmother    Lung cancer Maternal Grandfather        metastasized   Lung cancer Paternal Grandfather     Social History:  Social History   Substance and Sexual  Activity  Alcohol Use No     Social History   Substance and Sexual Activity  Drug Use Yes   Types: IV, Heroin, Cocaine, Marijuana   Comment: last use of heroin and cocaine was 01/2019, marijuana daily    Social History   Socioeconomic History   Marital status: Widowed    Spouse name: Not on file   Number of children: Not on file   Years of education: high school   Highest education level: Not on file  Occupational History   Not on file  Tobacco Use   Smoking status: Every Day    Packs/day: 0.50    Years: 10.00    Total pack years: 5.00    Types: Cigarettes   Smokeless tobacco: Never  Vaping Use   Vaping Use: Never used  Substance and Sexual Activity   Alcohol use: No   Drug use: Yes    Types: IV, Heroin, Cocaine, Marijuana    Comment: last use of heroin and cocaine was 01/2019, marijuana daily   Sexual activity: Yes    Birth control/protection: I.U.D.    Comment: Paragard  Other Topics Concern   Not on file  Social History Narrative   05/24/19   From: [name], [place]   Living: with boyfriend [name]) -- former husband died from overdose   Work: not currently, in the application process for disability  Family: good relationship with dad who lives in [place], mom is in [place]. [place] relationship with bother and sister      Enjoys: training her puppy (corndog)      Exercise: walking, exercise bike in the apartment   Diet: not the best, tries to cook at home, fruit/veggies      Safety   Seat belts: Yes    Guns: No   Safe in relationships: Yes    Social Determinants of Health   Financial Resource Strain: Not on file  Food Insecurity: No Food Insecurity (01/19/2022)   Hunger Vital Sign    Worried About Running Out of Food in the Last Year: Never true    Ran Out of Food in the Last Year: Never true  Transportation Needs: No Transportation Needs (01/19/2022)   PRAPARE - Administrator, Civil Service (Medical): No    Lack of Transportation  (Non-Medical): No  Physical Activity: Not on file  Stress: Not on file  Social Connections: Not on file   Additional Social History:                         Sleep: Good  Appetite:  Good  Current Medications: Current Facility-Administered Medications  Medication Dose Route Frequency Provider Last Rate Last Admin   acetaminophen (TYLENOL) tablet 650 mg  650 mg Oral Q6H PRN Clapacs, [name], MD   650 mg at 01/21/22 0850   acyclovir ointment (ZOVIRAX) 5 %   Topical Q3H Clapacs, [name], MD   Given at 01/26/22 0924   alum & mag hydroxide-simeth (MAALOX/MYLANTA) 200-200-20 MG/5ML suspension 30 mL  30 mL Oral Q4H PRN Clapacs, [name], MD       baclofen (LIORESAL) tablet 10 mg  10 mg Oral TID Clapacs, [name], MD   10 mg at 01/26/22 1213   busPIRone (BUSPAR) tablet 10 mg  10 mg Oral TID Clapacs, [name], MD   10 mg at 01/26/22 1213   docusate sodium (COLACE) capsule 100 mg  100 mg Oral BID Clapacs, [name], MD   100 mg at 01/26/22 1962   gabapentin (NEURONTIN) capsule 300 mg  300 mg Oral QHS Clapacs, [name], MD   300 mg at 01/25/22 2108   gadobutrol (GADAVIST) 1 MMOL/ML injection 5 mL  5 mL Intravenous Once PRN Clapacs, [name], MD       hydrOXYzine (ATARAX) tablet 50 mg  50 mg Oral Q6H PRN Clapacs, [name], MD   50 mg at 01/24/22 2108   lamoTRIgine (LAMICTAL) tablet 25 mg  25 mg Oral BID Clapacs, [name], MD   25 mg at 01/26/22 2297   magnesium hydroxide (MILK OF MAGNESIA) suspension 30 mL  30 mL Oral Daily PRN Clapacs, [name], MD       menthol-cetylpyridinium (CEPACOL) lozenge 3 mg  1 lozenge Oral PRN Clapacs, [name], MD       nicotine (NICODERM CQ - dosed in mg/24 hours) patch 14 mg  14 mg Transdermal Daily Clapacs, [name], MD   14 mg at 01/26/22 0925   ondansetron (ZOFRAN-ODT) disintegrating tablet 4 mg  4 mg Oral Q8H PRN Gillermo Murdoch, NP   4 mg at 01/20/22 2232   OXcarbazepine (TRILEPTAL) tablet 150 mg  150 mg Oral BID Clapacs, [name], MD   150 mg at 01/26/22 0924   QUEtiapine  (SEROQUEL) tablet 50 mg  50 mg Oral QHS Clapacs, [name], MD  50 mg at 01/25/22 2108    Lab Results: No results found for this or any previous visit (from the past 48 hour(s)).  Blood Alcohol level:  Lab Results  Component Value Date   ETH <10 04/29/2021   ETH <10 02/07/2019    Metabolic Disorder Labs: Lab Results  Component Value Date   HGBA1C 5.4 01/20/2022   MPG 108 01/20/2022   MPG 96.8 02/07/2019   No results found for: "PROLACTIN" Lab Results  Component Value Date   CHOL 192 01/21/2022   TRIG 89 01/21/2022   HDL 56 01/21/2022   CHOLHDL 3.4 01/21/2022   VLDL 18 01/21/2022   LDLCALC 118 (H) 01/21/2022   LDLCALC 67 11/05/2020    Physical Findings: AIMS: Facial and Oral Movements Muscles of Facial Expression: None, normal Lips and Perioral Area: None, normal Jaw: None, normal Tongue: None, normal,Extremity Movements Upper (arms, wrists, hands, fingers): None, normal Lower (legs, knees, ankles, toes): None, normal, Trunk Movements Neck, shoulders, hips: None, normal, Overall Severity Severity of abnormal movements (highest score from questions above): None, normal Incapacitation due to abnormal movements: None, normal Patient's awareness of abnormal movements (rate only patient's report): No Awareness, Dental Status Current problems with teeth and/or dentures?: No Does patient usually wear dentures?: No  CIWA:    COWS:     Musculoskeletal: Strength & Muscle Tone: within normal limits Gait & Station: normal Patient leans: N/A  Psychiatric Specialty Exam:  Presentation  General Appearance:  Appropriate for Environment  Eye Contact: Good  Speech: Clear and Coherent; Normal Rate  Speech Volume: Normal  Handedness: Right   Mood and Affect  Mood: Euthymic  Affect: Appropriate; Congruent   Thought Process  Thought Processes: Coherent; Goal Directed  Descriptions of Associations:Intact  Orientation:Full (Time, Place and Person)  Thought  Content:No data recorded History of Schizophrenia/Schizoaffective disorder:No data recorded Duration of Psychotic Symptoms:No data recorded Hallucinations:No data recorded Ideas of Reference:None  Suicidal Thoughts:No data recorded Homicidal Thoughts:No data recorded  Sensorium  Memory: Immediate Good; Recent Good  Judgment: Intact  Insight: Present   Executive Functions  Concentration: Good  Attention Span: Good  Recall: Good  Fund of Knowledge: Good  Language: Good   Psychomotor Activity  Psychomotor Activity:No data recorded  Assets  Assets: Communication Skills; Desire for Improvement; Housing; Health and safety inspector; Social Support; Physical Health   Sleep  Sleep:No data recorded    Blood pressure 115/85, pulse (!) 127, temperature 97.8 F (36.6 C), temperature source Oral, resp. rate 18, height 5\' 2"  (1.575 m), weight 58.5 kg, last menstrual period 12/24/2021, SpO2 98 %. Body mass index is 23.59 kg/m.   Treatment Plan Summary: Daily contact with patient to assess and evaluate symptoms and progress in treatment, Medication management, and Plan continue current medications.  12/26/2021, DO 01/26/2022, 12:37 PM

## 2022-01-27 ENCOUNTER — Other Ambulatory Visit: Payer: Self-pay

## 2022-01-27 MED ORDER — HYDROXYZINE HCL 50 MG PO TABS
50.0000 mg | ORAL_TABLET | Freq: Four times a day (QID) | ORAL | 0 refills | Status: AC | PRN
  Filled 2022-01-27: qty 60, 15d supply, fill #0

## 2022-01-27 MED ORDER — NICOTINE 14 MG/24HR TD PT24
14.0000 mg | MEDICATED_PATCH | Freq: Every day | TRANSDERMAL | 0 refills | Status: DC
  Filled 2022-01-27: qty 28, 28d supply, fill #0

## 2022-01-27 MED ORDER — GABAPENTIN 300 MG PO CAPS
300.0000 mg | ORAL_CAPSULE | Freq: Every day | ORAL | 0 refills | Status: DC
  Filled 2022-01-27: qty 30, 30d supply, fill #0

## 2022-01-27 MED ORDER — LAMOTRIGINE 25 MG PO TABS
25.0000 mg | ORAL_TABLET | Freq: Two times a day (BID) | ORAL | 0 refills | Status: DC
  Filled 2022-01-27: qty 60, 30d supply, fill #0

## 2022-01-27 MED ORDER — OXCARBAZEPINE 150 MG PO TABS: 150.0000 mg | ORAL_TABLET | Freq: Two times a day (BID) | ORAL | 1 refills | Status: DC

## 2022-01-27 MED ORDER — BUSPIRONE HCL 10 MG PO TABS
10.0000 mg | ORAL_TABLET | Freq: Three times a day (TID) | ORAL | 0 refills | Status: DC
  Filled 2022-01-27: qty 90, 30d supply, fill #0

## 2022-01-27 MED ORDER — HYDROXYZINE HCL 50 MG PO TABS: 50.0000 mg | ORAL_TABLET | Freq: Four times a day (QID) | ORAL | 1 refills | Status: DC | PRN

## 2022-01-27 MED ORDER — OXCARBAZEPINE 150 MG PO TABS
150.0000 mg | ORAL_TABLET | Freq: Two times a day (BID) | ORAL | 0 refills | Status: DC
  Filled 2022-01-27: qty 60, 30d supply, fill #0

## 2022-01-27 MED ORDER — BUSPIRONE HCL 10 MG PO TABS: 10.0000 mg | ORAL_TABLET | Freq: Three times a day (TID) | ORAL | 1 refills | Status: DC

## 2022-01-27 MED ORDER — ACYCLOVIR 5 % EX OINT: TOPICAL_OINTMENT | CUTANEOUS | 1 refills | Status: DC

## 2022-01-27 MED ORDER — QUETIAPINE FUMARATE 50 MG PO TABS: 50.0000 mg | ORAL_TABLET | Freq: Every day | ORAL | 1 refills | Status: DC

## 2022-01-27 MED ORDER — DOCUSATE SODIUM 100 MG PO CAPS: 100.0000 mg | ORAL_CAPSULE | Freq: Two times a day (BID) | ORAL | 1 refills | Status: DC

## 2022-01-27 MED ORDER — LAMOTRIGINE 25 MG PO TABS: 25.0000 mg | ORAL_TABLET | Freq: Two times a day (BID) | ORAL | 1 refills | Status: DC

## 2022-01-27 MED ORDER — BACLOFEN 10 MG PO TABS: 10.0000 mg | ORAL_TABLET | Freq: Three times a day (TID) | ORAL | 1 refills | Status: DC

## 2022-01-27 MED ORDER — GABAPENTIN 300 MG PO CAPS: 300.0000 mg | ORAL_CAPSULE | Freq: Every day | ORAL | 1 refills | Status: DC

## 2022-01-27 MED ORDER — NICOTINE 14 MG/24HR TD PT24: 14.0000 mg | MEDICATED_PATCH | Freq: Every day | TRANSDERMAL | 1 refills | Status: DC

## 2022-01-27 MED ORDER — SUMATRIPTAN SUCCINATE 50 MG PO TABS
50.0000 mg | ORAL_TABLET | ORAL | 0 refills | Status: DC | PRN
  Filled 2022-01-27: qty 9, 30d supply, fill #0

## 2022-01-27 MED ORDER — DOCUSATE SODIUM 100 MG PO CAPS
100.0000 mg | ORAL_CAPSULE | Freq: Two times a day (BID) | ORAL | 0 refills | Status: DC
  Filled 2022-01-27: qty 60, 30d supply, fill #0

## 2022-01-27 MED ORDER — ACYCLOVIR 5 % EX OINT
TOPICAL_OINTMENT | CUTANEOUS | 0 refills | Status: DC
  Filled 2022-01-27: qty 15, 15d supply, fill #0

## 2022-01-27 MED ORDER — BACLOFEN 10 MG PO TABS
10.0000 mg | ORAL_TABLET | Freq: Three times a day (TID) | ORAL | 0 refills | Status: AC
  Filled 2022-01-27: qty 90, 30d supply, fill #0

## 2022-01-27 MED ORDER — QUETIAPINE FUMARATE 50 MG PO TABS
50.0000 mg | ORAL_TABLET | Freq: Every day | ORAL | 0 refills | Status: DC
  Filled 2022-01-27: qty 30, 30d supply, fill #0

## 2022-01-27 NOTE — Plan of Care (Signed)
D- Patient alert and oriented. Patient presented in a pleasant mood on assessment reporting that she slept "fair" last night, stating that she had issues falling asleep, but had no complaints to voice to this Clinical research associate. Patient endorsed hopelessness, depression and anxiety, stating that she is "just ready to go home". Patient denies SI, HI, AVH, and pain at this time. Per her self-inventory, patient's stated goal for today is "discharge", in which she will talk to the doctor in order to achieve her goal.  A- Scheduled medications administered to patient, per MD orders. Support and encouragement provided.  Routine safety checks conducted every 15 minutes.  Patient informed to notify staff with problems or concerns.  R- No adverse drug reactions noted. Patient contracts for safety at this time. Patient compliant with medications and treatment plan. Patient receptive, calm, and cooperative. Patient interacts well with others on the unit. Patient remains safe at this time.  Problem: Education: Goal: Knowledge of General Education information will improve Description: Including pain rating scale, medication(s)/side effects and non-pharmacologic comfort measures Outcome: Progressing   Problem: Health Behavior/Discharge Planning: Goal: Ability to manage health-related needs will improve Outcome: Progressing   Problem: Clinical Measurements: Goal: Ability to maintain clinical measurements within normal limits will improve Outcome: Progressing Goal: Will remain free from infection Outcome: Progressing Goal: Diagnostic test results will improve Outcome: Progressing Goal: Respiratory complications will improve Outcome: Progressing Goal: Cardiovascular complication will be avoided Outcome: Progressing   Problem: Activity: Goal: Risk for activity intolerance will decrease Outcome: Progressing   Problem: Nutrition: Goal: Adequate nutrition will be maintained Outcome: Progressing   Problem:  Coping: Goal: Level of anxiety will decrease Outcome: Progressing   Problem: Elimination: Goal: Will not experience complications related to bowel motility Outcome: Progressing Goal: Will not experience complications related to urinary retention Outcome: Progressing   Problem: Pain Managment: Goal: General experience of comfort will improve Outcome: Progressing   Problem: Safety: Goal: Ability to remain free from injury will improve Outcome: Progressing   Problem: Skin Integrity: Goal: Risk for impaired skin integrity will decrease Outcome: Progressing   Problem: Education: Goal: Ability to manage disease process will improve Outcome: Progressing   Problem: Cardiac: Goal: Ability to achieve and maintain adequate cardiopulmonary perfusion will improve Outcome: Progressing   Problem: Neurologic: Goal: Promote progressive neurologic recovery Outcome: Progressing   Problem: Skin Integrity: Goal: Risk for impaired skin integrity will be minimized. Outcome: Progressing

## 2022-01-27 NOTE — Group Note (Signed)
LCSW Group Therapy Note  Group Date: 01/27/2022 Start Time: 1300 End Time: 1400   Type of Therapy and Topic:  Group Therapy - How To Cope with Nervousness about Discharge   Participation Level:  Active   Description of Group This process group involved identification of patients' feelings about discharge. Some of them are scheduled to be discharged soon, while others are new admissions, but each of them was asked to share thoughts and feelings surrounding discharge from the hospital. One common theme was that they are excited at the prospect of going home, while another was that many of them are apprehensive about sharing why they were hospitalized. Patients were given the opportunity to discuss these feelings with their peers in preparation for discharge.  Therapeutic Goals  Patient will identify their overall feelings about pending discharge. Patient will think about how they might proactively address issues that they believe will once again arise once they get home (i.e. with parents). Patients will participate in discussion about having hope for change.   Summary of Patient Progress:    Patient was present for the entirety of the group session. Patient was an active listener and participated in the topic of discussion, provided helpful advice to others, and added nuance to topic of conversation. Patient shared the importance of communication in the discharge process. States she intends on discharging to her mother's home with the intention to go to [employer].    Therapeutic Modalities Cognitive Behavioral Therapy   Almedia Balls 01/27/2022  2:24 PM

## 2022-01-27 NOTE — Progress Notes (Signed)
[facility] MD Progress Note  01/27/2022 11:44 AM Jane Watkins  MRN:  [id number] Subjective: Follow-up for this 26 year old woman with depression and opiate use.  Patient reports she is feeling better.  Less depressed and less anxious.  Denies suicidal thoughts.  Now states that she can go stay with her mother in [place] temporarily.  Requesting discharge.  Lip feeling better.  Still having trouble speaking but ENT saw her and found nothing wrong physiologically Principal Problem: Severe recurrent major depression without psychotic features ([place]) Diagnosis: Principal Problem:   Severe recurrent major depression without psychotic features ([place]) Active Problems:   Opiate abuse, continuous ([place])   History of ischemic stroke  Total Time spent with patient: 30 minutes  Past Psychiatric History: Past history of depression and substance abuse  Past Medical History:  Past Medical History:  Diagnosis Date   AKI (acute kidney injury) ([place]) 2018   "from overdose"   Anxiety    Chlamydia    Daily headache    Depression    Drug overdose 04/2016   [name] 05/01/2016   GERD (gastroesophageal reflux disease)    "when I was younger; gone now" (09/21/2017)   IV drug abuse ([place]) 09/20/2017   Migraine    "q couple weeks" (09/21/2017)   Opioid abuse ([place])    Overdose    Stroke [facility])     Past Surgical History:  Procedure Laterality Date   APPENDECTOMY  04/2013   I & D EXTREMITY Left 09/20/2017   Procedure: IRRIGATION AND DEBRIDEMENT LEFT ARM;  Surgeon: [name], MD;  Location: [place];  Service: Orthopedics;  Laterality: Left;   TEE WITHOUT CARDIOVERSION N/A 02/10/2019   Procedure: TRANSESOPHAGEAL ECHOCARDIOGRAM (TEE);  Surgeon: [name], MD;  Location: [facility];  Service: Cardiovascular;  Laterality: N/A;  Polysubstance abuser   TONGUE SURGERY  ~ 2007   "related to lisp"   TRACHEOSTOMY  04/2016   [name] 05/01/2016   Family History:  Family History  Problem Relation Age of Onset   Anemia  Father    Healthy Sister    Thyroid cancer Maternal Grandmother    Lung cancer Maternal Grandfather        metastasized   Lung cancer Paternal Grandfather    Family Psychiatric  History: See previous Social History:  Social History   Substance and Sexual Activity  Alcohol Use No     Social History   Substance and Sexual Activity  Drug Use Yes   Types: IV, Heroin, Cocaine, Marijuana   Comment: last use of heroin and cocaine was 01/2019, marijuana daily    Social History   Socioeconomic History   Marital status: Widowed    Spouse name: Not on file   Number of children: Not on file   Years of education: high school   Highest education level: Not on file  Occupational History   Not on file  Tobacco Use   Smoking status: Every Day    Packs/day: 0.50    Years: 10.00    Total pack years: 5.00    Types: Cigarettes   Smokeless tobacco: Never  Vaping Use   Vaping Use: Never used  Substance and Sexual Activity   Alcohol use: No   Drug use: Yes    Types: IV, Heroin, Cocaine, Marijuana    Comment: last use of heroin and cocaine was 01/2019, marijuana daily   Sexual activity: Yes    Birth control/protection: I.U.D.    Comment: Paragard  Other Topics Concern   Not on file  Social History Narrative   05/24/19   From: [name], [place]   Living: with boyfriend [name]) -- former husband died from overdose   Work: not currently, in the application process for disability      Family: good relationship with dad who lives in [place], mom is in [place]. [place] relationship with bother and sister      Enjoys: training her puppy (corndog)      Exercise: walking, exercise bike in the apartment   Diet: not the best, tries to cook at home, fruit/veggies      Safety   Seat belts: Yes    Guns: No   Safe in relationships: Yes    Social Determinants of Radio broadcast assistant Strain: Not on file  Food Insecurity: No Food Insecurity (01/19/2022)   Hunger Vital Sign    Worried About  Running Out of Food in the Last Year: Never true    [place] in the Last Year: Never true  Transportation Needs: No Transportation Needs (01/19/2022)   PRAPARE - Hydrologist (Medical): No    Lack of Transportation (Non-Medical): No  Physical Activity: Not on file  Stress: Not on file  Social Connections: Not on file   Additional Social History:                         Sleep: Fair  Appetite:  Fair  Current Medications: Current Facility-Administered Medications  Medication Dose Route Frequency Provider Last Rate Last Admin   acetaminophen (TYLENOL) tablet 650 mg  650 mg Oral Q6H PRN Kainan Patty, Madie Reno, MD   650 mg at 01/21/22 0850   acyclovir ointment (ZOVIRAX) 5 %   Topical Q3H Bhavin Monjaraz, Madie Reno, MD   1 Application at [phone]   alum & mag hydroxide-simeth (MAALOX/MYLANTA) 200-200-20 MG/5ML suspension 30 mL  30 mL Oral Q4H PRN Ifeoluwa Beller, Madie Reno, MD       baclofen (LIORESAL) tablet 10 mg  10 mg Oral TID Ridhi Hoffert T, MD   10 mg at 01/27/22 0912   busPIRone (BUSPAR) tablet 10 mg  10 mg Oral TID Sheyann Sulton T, MD   10 mg at 01/27/22 0912   docusate sodium (COLACE) capsule 100 mg  100 mg Oral BID Zeenat Jeanbaptiste T, MD   100 mg at 01/27/22 0912   gabapentin (NEURONTIN) capsule 300 mg  300 mg Oral QHS Tymothy Cass T, MD   300 mg at 01/26/22 2120   gadobutrol (GADAVIST) 1 MMOL/ML injection 5 mL  5 mL Intravenous Once PRN Kely Dohn, Madie Reno, MD       hydrOXYzine (ATARAX) tablet 50 mg  50 mg Oral Q6H PRN Jonita Hirota T, MD   50 mg at 01/26/22 1659   lamoTRIgine (LAMICTAL) tablet 25 mg  25 mg Oral BID Ingrid Shifrin T, MD   25 mg at 01/27/22 0912   magnesium hydroxide (MILK OF MAGNESIA) suspension 30 mL  30 mL Oral Daily PRN Shaneka Efaw, Madie Reno, MD       menthol-cetylpyridinium (CEPACOL) lozenge 3 mg  1 lozenge Oral PRN Aurora Rody T, MD       nicotine (NICODERM CQ - dosed in mg/24 hours) patch 14 mg  14 mg Transdermal Daily Leighann Amadon T, MD   14 mg  at 01/27/22 0913   ondansetron (ZOFRAN-ODT) disintegrating tablet 4 mg  4 mg Oral Q8H PRN Caroline Sauger, NP   4 mg at 01/20/22 2232  OXcarbazepine (TRILEPTAL) tablet 150 mg  150 mg Oral BID Karlis Cregg, [name], MD   150 mg at 01/27/22 0912   QUEtiapine (SEROQUEL) tablet 50 mg  50 mg Oral QHS Ronny Korff, [name], MD   50 mg at 01/26/22 2120    Lab Results: No results found for this or any previous visit (from the past 48 hour(s)).  Blood Alcohol level:  Lab Results  Component Value Date   ETH <10 04/29/2021   ETH <10 02/07/2019    Metabolic Disorder Labs: Lab Results  Component Value Date   HGBA1C 5.4 01/20/2022   MPG 108 01/20/2022   MPG 96.8 02/07/2019   No results found for: "PROLACTIN" Lab Results  Component Value Date   CHOL 192 01/21/2022   TRIG 89 01/21/2022   HDL 56 01/21/2022   CHOLHDL 3.4 01/21/2022   VLDL 18 01/21/2022   LDLCALC 118 (H) 01/21/2022   LDLCALC 67 11/05/2020    Physical Findings: AIMS: Facial and Oral Movements Muscles of Facial Expression: None, normal Lips and Perioral Area: None, normal Jaw: None, normal Tongue: None, normal,Extremity Movements Upper (arms, wrists, hands, fingers): None, normal Lower (legs, knees, ankles, toes): None, normal, Trunk Movements Neck, shoulders, hips: None, normal, Overall Severity Severity of abnormal movements (highest score from questions above): None, normal Incapacitation due to abnormal movements: None, normal Patient's awareness of abnormal movements (rate only patient's report): No Awareness, Dental Status Current problems with teeth and/or dentures?: No Does patient usually wear dentures?: No  CIWA:    COWS:     Musculoskeletal: Strength & Muscle Tone: within normal limits Gait & Station: normal Patient leans: N/A  Psychiatric Specialty Exam:  Presentation  General Appearance:  Appropriate for Environment  Eye Contact: Good  Speech: Clear and Coherent; Normal Rate  Speech  Volume: Normal  Handedness: Right   Mood and Affect  Mood: Euthymic  Affect: Appropriate; Congruent   Thought Process  Thought Processes: Coherent; Goal Directed  Descriptions of Associations:Intact  Orientation:Full (Time, Place and Person)  Thought Content:No data recorded History of Schizophrenia/Schizoaffective disorder:No data recorded Duration of Psychotic Symptoms:No data recorded Hallucinations:No data recorded Ideas of Reference:None  Suicidal Thoughts:No data recorded Homicidal Thoughts:No data recorded  Sensorium  Memory: Immediate Good; Recent Good  Judgment: Intact  Insight: Present   Executive Functions  Concentration: Good  Attention Span: Good  Recall: Good  Fund of Knowledge: Good  Language: Good   Psychomotor Activity  Psychomotor Activity:No data recorded  Assets  Assets: Communication Skills; Desire for Improvement; Housing; Health and safety inspector; Social Support; Physical Health   Sleep  Sleep:No data recorded   Physical Exam: Physical Exam Vitals and nursing note reviewed.  Constitutional:      Appearance: Normal appearance.  HENT:     Head: Normocephalic and atraumatic.     Mouth/Throat:     Pharynx: Oropharynx is clear.  Eyes:     Pupils: Pupils are equal, round, and reactive to light.  Cardiovascular:     Rate and Rhythm: Normal rate and regular rhythm.  Pulmonary:     Effort: Pulmonary effort is normal.     Breath sounds: Normal breath sounds.  Abdominal:     General: Abdomen is flat.     Palpations: Abdomen is soft.  Musculoskeletal:        General: Normal range of motion.  Skin:    General: Skin is warm and dry.  Neurological:     General: No focal deficit present.     Mental Status: She is  alert. Mental status is at baseline.  Psychiatric:        Mood and Affect: Mood normal.        Thought Content: Thought content normal.    Review of Systems  Constitutional: Negative.   HENT:  Negative.    Eyes: Negative.   Respiratory: Negative.    Cardiovascular: Negative.   Gastrointestinal: Negative.   Musculoskeletal: Negative.   Skin: Negative.   Neurological: Negative.   Psychiatric/Behavioral: Negative.     Blood pressure 105/73, pulse 74, temperature 97.8 F (36.6 C), temperature source Oral, resp. rate 18, height 5\' 2"  (1.575 m), weight 58.5 kg, last menstrual period 12/24/2021, SpO2 99 %. Body mass index is 23.59 kg/m.   Treatment Plan Summary: Medication management and Plan no change to current medicine.  Follow-up with speech therapy today but anticipate likely discharge tomorrow  Alethia Berthold, MD 01/27/2022, 11:44 AM

## 2022-01-27 NOTE — Progress Notes (Signed)
Recreation Therapy Notes  Date: 01/27/2022   Time: 10:25 am     Location: Court yard    Behavioral response: N/A   Intervention Topic: Honesty    Discussion/Intervention: Patient refused to attend group.    Clinical Observations/Feedback:  Patient refused to attend group.    Jahyra Sukup LRT/CTRS        Yecheskel Kurek 01/27/2022 10:37 AM

## 2022-01-27 NOTE — Evaluation (Signed)
Speech Language Pathology Evaluation Patient Details Name: Jane Watkins MRN: [id number] DOB: 03-Feb-1996 Today's Date: 01/27/2022 Time: 1445-1500 SLP Time Calculation (min) (ACUTE ONLY): 15 min  Problem List:  Patient Active Problem List   Diagnosis Date Noted   History of ischemic stroke 01/20/2022   Severe recurrent major depression without psychotic features (HCC) 01/19/2022   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   Cardiac arrest (HCC) 01/05/2022   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    Past Medical History:  Past Medical History:  Diagnosis Date   AKI (acute kidney injury) (HCC) 2018   "from overdose"   Anxiety    Chlamydia    Daily headache    Depression    Drug overdose 04/2016   Jane Watkins 05/01/2016   GERD (gastroesophageal reflux disease)    "when I was younger; gone now" (09/21/2017)   IV drug abuse (HCC) 09/20/2017   Migraine    "q couple weeks" (09/21/2017)   Opioid abuse (HCC)    Overdose    Stroke [facility])    Past Surgical History:  Past Surgical History:  Procedure Laterality Date   APPENDECTOMY  04/2013   I & D EXTREMITY Left 09/20/2017   Procedure: IRRIGATION AND DEBRIDEMENT LEFT ARM;  Surgeon: Jane Bienenstock, MD;  Location: [facility] OR;  Service: Orthopedics;  Laterality: Left;   TEE WITHOUT CARDIOVERSION N/A 02/10/2019   Procedure: TRANSESOPHAGEAL ECHOCARDIOGRAM (TEE);   Surgeon: Jane Odea, MD;  Location: [facility] ORS;  Service: Cardiovascular;  Laterality: N/A;  Polysubstance abuser   TONGUE SURGERY  ~ 2007   "related to lisp"   TRACHEOSTOMY  04/2016   /notes 05/01/2016   HPI:  Jane Watkins is a 26 y.o. female.  With PMH of polysubstance abuse and history of right MCA CVA (2021), anxiety brought in by EMS postcardiac arrest.  They were called by bystander for unresponsiveness.  They did endorse that she was using fentanyl and crack.  She was given 8 mg of intranasal Narcan followed by 2 IO Narcan.  Found to be in asystolic arrest.  She was coded for 20 minutes and given 2 doses of epinephrine with ROSC.  King airway was placed as well as right tibial IO. Pt extubated on 01/06/2022. Pt endorsed incident as suicide attempt. Pt transfered to [facility]U on 01/19/2022.   Assessment / Plan / Recommendation Clinical Impression  ST consulted d/t low vocal intensity. Jane Watkins is known to this Clinical research associate from previous admissions and being pt's primary SLP during such admissions. While pt is soft spoken, this was her baseline prior to admission. Since meeting pt in 2018, pt has been a conversationally soft speaker. Pt's speech intelligibility when conveying unknown information to this writer is ~ 100% intelligible with no request for pt to repeat herself. At this time, skilled ST intervention is not indicated.    SLP Assessment  SLP Recommendation/Assessment: Patient does not need any further Speech Lanaguage Pathology Services SLP Visit Diagnosis: Cognitive  communication deficit (R41.841)    Recommendations for follow up therapy are one component of a multi-disciplinary discharge planning process, led by the attending physician.  Recommendations may be updated based on patient status, additional functional criteria and insurance authorization.    Follow Up Recommendations  No SLP follow up    Assistance Recommended at Discharge     Functional Status Assessment Patient  has had a recent decline in their functional status and/or demonstrates limited ability to make significant improvements in function in a reasonable and predictable amount of time  Frequency and Duration           SLP Evaluation Cognition  Overall Cognitive Status: History of cognitive impairments - at baseline       Comprehension  Auditory Comprehension Overall Auditory Comprehension: Appears within functional limits for tasks assessed    Expression Expression Primary Mode of Expression: Verbal Verbal Expression Overall Verbal Expression: Appears within functional limits for tasks assessed   Oral / Motor  Oral Motor/Sensory Function Overall Oral Motor/Sensory Function: Within functional limits Motor Speech Overall Motor Speech: Appears within functional limits for tasks assessed Respiration: Within functional limits Phonation: Normal Resonance: Within functional limits Articulation: Within functional limitis Intelligibility: Intelligible Motor Planning: Witnin functional limits Motor Speech Errors: Not applicable           Jane Watkins B. Dreama Saa, M.S., CCC-SLP, Tree surgeon Certified Brain Injury Specialist [facility]  [facility] Office [phone] Ascom [phone] Fax [phone]

## 2022-01-27 NOTE — Progress Notes (Signed)
Patient calm and pleasant during assessment denying SI/HI/AVH. Patient observed interacting appropriately with staff and peers on the unit. Pt compliant with medication administration per MD orders. Pt given education, support, and encouragement to be active in her treatment plan. Pt being monitored Q 15 minutes for safety per unit protocol, remains safe on the unit . Pt stated that she is ready for D/C tomorrow

## 2022-01-28 ENCOUNTER — Other Ambulatory Visit: Payer: Self-pay

## 2022-01-28 NOTE — Progress Notes (Signed)
Recreation Therapy Notes  INPATIENT RECREATION TR PLAN  Patient Details Name: Jane Watkins MRN: [id number] DOB: [birth year] Today's Date: 01/28/2022  Rec Therapy Plan Is patient appropriate for Therapeutic Recreation?: Yes Treatment times per week: at least 3 Estimated Length of Stay: 5-7 days TR Treatment/Interventions: Group participation (Comment)  Discharge Criteria Pt will be discharged from therapy if:: Discharged Treatment plan/goals/alternatives discussed and agreed upon by:: Patient/family  Discharge Summary Short term goals set: Patient will engage in groups without prompting or encouragement from LRT x3 group sessions within 5 recreation therapy group sessions Short term goals met: Not met Progress toward goals comments:  (Patient did not attendnany groups) Reason goals not met: Patient did not attend any groups. Therapeutic equipment acquired: N/A Reason patient discharged from therapy: Discharge from hospital Pt/family agrees with progress & goals achieved: Yes Date patient discharged from therapy: 01/28/22   Gailyn Crook 01/28/2022, 12:13 PM

## 2022-01-28 NOTE — BHH Suicide Risk Assessment (Signed)
[facility] Discharge Suicide Risk Assessment   Principal Problem: Severe recurrent major depression without psychotic features [facility]) Discharge Diagnoses: Principal Problem:   Severe recurrent major depression without psychotic features (HCC) Active Problems:   Opiate abuse, continuous (HCC)   History of ischemic stroke   Total Time spent with patient: 30 minutes  Musculoskeletal: Strength & Muscle Tone: within normal limits Gait & Station: normal Patient leans: N/A  Psychiatric Specialty Exam  Presentation  General Appearance:  Appropriate for Environment  Eye Contact: Good  Speech: Clear and Coherent; Normal Rate  Speech Volume: Normal  Handedness: Right   Mood and Affect  Mood: Euthymic  Duration of Depression Symptoms: No data recorded Affect: Appropriate; Congruent   Thought Process  Thought Processes: Coherent; Goal Directed  Descriptions of Associations:Intact  Orientation:Full (Time, Place and Person)  Thought Content:No data recorded History of Schizophrenia/Schizoaffective disorder:No data recorded Duration of Psychotic Symptoms:No data recorded Hallucinations:No data recorded Ideas of Reference:None  Suicidal Thoughts:No data recorded Homicidal Thoughts:No data recorded  Sensorium  Memory: Immediate Good; Recent Good  Judgment: Intact  Insight: Present   Executive Functions  Concentration: Good  Attention Span: Good  Recall: Good  Fund of Knowledge: Good  Language: Good   Psychomotor Activity  Psychomotor Activity:No data recorded  Assets  Assets: Communication Skills; Desire for Improvement; Housing; Health and safety inspector; Social Support; Physical Health   Sleep  Sleep:No data recorded  Physical Exam: Physical Exam Vitals and nursing note reviewed.  Constitutional:      Appearance: Normal appearance.  HENT:     Head: Normocephalic and atraumatic.     Mouth/Throat:     Pharynx: Oropharynx is  clear.  Eyes:     Pupils: Pupils are equal, round, and reactive to light.  Cardiovascular:     Rate and Rhythm: Normal rate and regular rhythm.  Pulmonary:     Effort: Pulmonary effort is normal.     Breath sounds: Normal breath sounds.  Abdominal:     General: Abdomen is flat.     Palpations: Abdomen is soft.  Musculoskeletal:        General: Normal range of motion.  Skin:    General: Skin is warm and dry.  Neurological:     General: No focal deficit present.     Mental Status: She is alert. Mental status is at baseline.  Psychiatric:        Attention and Perception: Attention normal.        Mood and Affect: Mood normal.        Speech: Speech normal.        Behavior: Behavior normal.        Thought Content: Thought content normal.        Cognition and Memory: Cognition normal.        Judgment: Judgment normal.    Review of Systems  Constitutional: Negative.   HENT: Negative.    Eyes: Negative.   Respiratory: Negative.    Cardiovascular: Negative.   Gastrointestinal: Negative.   Musculoskeletal: Negative.   Skin: Negative.   Neurological: Negative.   Psychiatric/Behavioral: Negative.     Blood pressure 112/65, pulse 82, temperature 98 F (36.7 C), temperature source Oral, resp. rate 20, height 5\' 2"  (1.575 m), weight 58.5 kg, last menstrual period 12/24/2021, SpO2 93 %. Body mass index is 23.59 kg/m.  Mental Status Per Nursing Assessment::   On Admission:  NA  Demographic Factors:  Caucasian and Low socioeconomic status  Loss Factors: Decline in physical health and Financial problems/change  in socioeconomic status  Historical Factors: Impulsivity  Risk Reduction Factors:   Positive social support  Continued Clinical Symptoms:  Depression:   Impulsivity Alcohol/Substance Abuse/Dependencies  Cognitive Features That Contribute To Risk:  None    Suicide Risk:  Minimal: No identifiable suicidal ideation.  Patients presenting with no risk factors but with  morbid ruminations; may be classified as minimal risk based on the severity of the depressive symptoms   Follow-up Information     Narcotics Anonymous (NA) Follow up.   Why: Please go to [email or web address] in order to see schedule for NA and AA meetings in your area.        [facility]. Call.   Specialty: [facility] information: [address] [place] [address] [phone]                 Plan Of Care/Follow-up recommendations:  Other:  Patient is reporting depressive symptoms are improved and she denies any suicidal thoughts.  Not displaying any dangerous behavior.  Patient agrees to recommendations for outpatient treatment.  Supportive therapy and educational counseling completed.  Does not at this point appear to be meeting commitment criteria or requiring further inpatient treatment  Mordecai Rasmussen, MD 01/28/2022, 10:25 AM

## 2022-01-28 NOTE — Care Management Important Message (Signed)
Important Message  Patient Details  Name: Jane Watkins MRN: [id number] Date of Birth: 1995-04-17   Medicare Important Message Given:  Yes  Patient informed of right to appeal discharge, provided phone number to [facility]. Patient expressed no interest in appealing discharge at this time. CSW will continue to monitor situation.   Corky Crafts, LCSWA 01/28/2022, 10:33 AM

## 2022-01-28 NOTE — Progress Notes (Signed)
Patient ID: Jane Watkins, female   DOB: 1995-03-22, 26 y.o.   MRN: [id number]  Discharge Note:  Patient denies SI/HI/AVH at this time. Discharge instructions, AVS, prescriptions, and transition record gone over with patient. Patient agrees to comply with medication management, follow-up visit, and outpatient therapy. Patient belongings returned to patient. Patient questions and concerns addressed and answered. Patient ambulatory off unit. Patient discharged to home with her Mother.

## 2022-01-28 NOTE — Progress Notes (Signed)
D- Patient alert and oriented. Patient presents in a pleasant mood on assessment reporting that she slept poorly last night, but did not go into detail as to what disturbed her sleep. Patient denies SI, HI, AVH, and pain at this time. Patient also denies any signs/symptoms of depression and anxiety. Patient had no stated goals for today, just voicing that she is ready to go home.  A- Scheduled medications administered to patient, per MD orders. Support and encouragement provided. Routine safety checks conducted every 15 minutes. Patient informed to notify staff with problems or concerns.  R- No adverse drug reactions noted. Patient contracts for safety at this time. Patient compliant with medications and treatment plan. Patient receptive, calm, and cooperative. Patient interacts well with others on the unit. Patient remains safe at this time.

## 2022-01-28 NOTE — Progress Notes (Signed)
  [facility] Adult Case Management Discharge Plan :  Will you be returning to the same living situation after discharge:  No. Patient to discharge to mother's residence. CSW has confirmed with mother.   At discharge, do you have transportation home?: Yes,  Patient's mother to provide transportation from hospital at 1200 PM.  Do you have the ability to pay for your medications: Yes,  [employer] Medicare  Patient informed of right to appeal discharge, provided phone number to [facility]. Patient expressed no interest in appealing discharge at this time. CSW will continue to monitor situation.  Release of information consent forms completed and in the chart;  Patient's signature needed at discharge.  Patient to Follow up at:  Follow-up Information     Narcotics Anonymous (NA) Follow up.   Why: Please go to [email or web address] in order to see schedule for NA and AA meetings in your area.        [facility]. Go on 02/21/2022.   Specialty: Behavioral Health Why: Please see your therapist on Fri 1/12 at 1100am for hospital discharge appointment, medication management appointment will be scheduled at this time. Contact information: [address] [place] [address] [phone]                 Next level of care provider has access to [facility] Link:no  Safety Planning and Suicide Prevention discussed: Yes,  SPE completed with patient and  Corky Sing, mother, [phone]   Has patient been referred to the Quitline?: Patient refused referral  Tobacco Use: High Risk (01/23/2022)   Patient History    Smoking Tobacco Use: Every Day    Smokeless Tobacco Use: Never    Passive Exposure: Not on file   Patient has been referred for addiction treatment: Yes Patient referred to SUD treatment at [facility], appointment information listed above.  Social History   Substance and Sexual Activity  Alcohol Use No   Social History   Substance and Sexual Activity  Drug Use Yes   Types: IV,  Heroin, Cocaine, Marijuana   Comment: last use of heroin and cocaine was 01/2019, marijuana daily   Corky Crafts, LCSWA 01/28/2022, 10:29 AM

## 2022-01-28 NOTE — Plan of Care (Signed)
  Problem: Group Participation Goal: STG - Patient will engage in groups without prompting or encouragement from LRT x3 group sessions within 5 recreation therapy group sessions Description: STG - Patient will engage in groups without prompting or encouragement from LRT x3 group sessions within 5 recreation therapy group sessions 01/28/2022 1211 by Ernest Haber, LRT Outcome: Not Applicable 07/62/2633 [id number] by Ernest Haber, LRT Outcome: Not Met (add Reason) Note: Patient did not attend groups.

## 2022-01-28 NOTE — Discharge Summary (Signed)
Physician Discharge Summary Note  Patient:  Jane Watkins is an 26 y.o., female MRN:  [id number] DOB:  [birth year] Patient phone:  [phone] (home)  Patient address:   [place] [address],  Total Time spent with patient: 30 minutes  Date of Admission:  01/19/2022 Date of Discharge: 01/28/2022  Reason for Admission: Patient was admitted to the hospital transferred from [facility] where she had presented with an overdose that at first was thought to be accidental but ultimately revealed by the patient to be a suicide attempt  Principal Problem: Severe recurrent major depression without psychotic features [facility]) Discharge Diagnoses: Principal Problem:   Severe recurrent major depression without psychotic features ([place]) Active Problems:   Opiate abuse, continuous ([place])   History of ischemic stroke   Past Psychiatric History: Chronic history of substance use primarily opiate use recurrent depression.  History of stroke.  Past Medical History:  Past Medical History:  Diagnosis Date   AKI (acute kidney injury) ([place]) 2018   "from overdose"   Anxiety    Chlamydia    Daily headache    Depression    Drug overdose 04/2016   [name] 05/01/2016   GERD (gastroesophageal reflux disease)    "when I was younger; gone now" (09/21/2017)   IV drug abuse ([place]) 09/20/2017   Migraine    "q couple weeks" (09/21/2017)   Opioid abuse ([place])    Overdose    Stroke [facility])     Past Surgical History:  Procedure Laterality Date   APPENDECTOMY  04/2013   I & D EXTREMITY Left 09/20/2017   Procedure: IRRIGATION AND DEBRIDEMENT LEFT ARM;  Surgeon: [name], MD;  Location: [facility];  Service: Orthopedics;  Laterality: Left;   TEE WITHOUT CARDIOVERSION N/A 02/10/2019   Procedure: TRANSESOPHAGEAL ECHOCARDIOGRAM (TEE);  Surgeon: [name], MD;  Location: [facility];  Service: Cardiovascular;  Laterality: N/A;  Polysubstance abuser   TONGUE SURGERY  ~ 2007   "related to lisp"    TRACHEOSTOMY  04/2016   [name] 05/01/2016   Family History:  Family History  Problem Relation Age of Onset   Anemia Father    Healthy Sister    Thyroid cancer Maternal Grandmother    Lung cancer Maternal Grandfather        metastasized   Lung cancer Paternal Grandfather    Family Psychiatric  History: Family history of some mental health problems Social History:  Social History   Substance and Sexual Activity  Alcohol Use No     Social History   Substance and Sexual Activity  Drug Use Yes   Types: IV, Heroin, Cocaine, Marijuana   Comment: last use of heroin and cocaine was 01/2019, marijuana daily    Social History   Socioeconomic History   Marital status: Widowed    Spouse name: Not on file   Number of children: Not on file   Years of education: high school   Highest education level: Not on file  Occupational History   Not on file  Tobacco Use   Smoking status: Every Day    Packs/day: 0.50    Years: 10.00    Total pack years: 5.00    Types: Cigarettes   Smokeless tobacco: Never  Vaping Use   Vaping Use: Never used  Substance and Sexual Activity   Alcohol use: No   Drug use: Yes    Types: IV, Heroin, Cocaine, Marijuana    Comment: last use of heroin and cocaine was 01/2019, marijuana daily  Sexual activity: Yes    Birth control/protection: I.U.D.    Comment: [place]  Other Topics Concern   Not on file  Social History Narrative   05/24/19   From: [place], [place]   Living: with boyfriend [place]) -- former husband died from overdose   Work: not currently, in the application process for disability      Family: good relationship with dad who lives in [place], mom is in [place]. [place] relationship with bother and sister      Enjoys: training her puppy (corndog)      Exercise: walking, exercise bike in the apartment   Diet: not the best, tries to cook at home, fruit/veggies      Safety   Seat belts: Yes    Guns: No   Safe in relationships: Yes    Social  Determinants of Radio broadcast assistant Strain: Not on file  Food Insecurity: No Food Insecurity (01/19/2022)   Hunger Vital Sign    Worried About Running Out of Food in the Last Year: Never true    [place] in the Last Year: Never true  Transportation Needs: No Transportation Needs (01/19/2022)   PRAPARE - Hydrologist (Medical): No    Lack of Transportation (Non-Medical): No  Physical Activity: Not on file  Stress: Not on file  Social Connections: Not on file    Hospital Course: Patient kept on 15-minute checks.  Did not display dangerous or suicidal behavior on the unit.  Patient was cooperative with treatment.  Initially restarted on Suboxone she then requested to discontinue it saying she did not like how it felt.  Continued on antidepressant medicine as well as her usual medical medications.  Patient was seen in consultation by ENT and neurology and speech therapy because of her complaints about hearing loss and difficulty speaking.  No specific diagnosis has been made related to those.  Issues appear to be stable.  Patient had been requesting that we send her to a substance abuse rehab facility.  No facility was found and the patient now states that she can stay with her mother for the time being and feels safe with that and agrees to outpatient follow-up.  Physical Findings: AIMS: Facial and Oral Movements Muscles of Facial Expression: None, normal Lips and Perioral Area: None, normal Jaw: None, normal Tongue: None, normal,Extremity Movements Upper (arms, wrists, hands, fingers): None, normal Lower (legs, knees, ankles, toes): None, normal, Trunk Movements Neck, shoulders, hips: None, normal, Overall Severity Severity of abnormal movements (highest score from questions above): None, normal Incapacitation due to abnormal movements: None, normal Patient's awareness of abnormal movements (rate only patient's report): No Awareness, Dental  Status Current problems with teeth and/or dentures?: No Does patient usually wear dentures?: No  CIWA:    COWS:     Musculoskeletal: Strength & Muscle Tone: within normal limits Gait & Station: normal Patient leans: N/A   Psychiatric Specialty Exam:  Presentation  General Appearance:  Appropriate for Environment  Eye Contact: Good  Speech: Clear and Coherent; Normal Rate  Speech Volume: Normal  Handedness: Right   Mood and Affect  Mood: Euthymic  Affect: Appropriate; Congruent   Thought Process  Thought Processes: Coherent; Goal Directed  Descriptions of Associations:Intact  Orientation:Full (Time, Place and Person)  Thought Content:No data recorded History of Schizophrenia/Schizoaffective disorder:No data recorded Duration of Psychotic Symptoms:No data recorded Hallucinations:No data recorded Ideas of Reference:None  Suicidal Thoughts:No data recorded Homicidal Thoughts:No data recorded  Sensorium  Memory: Immediate Good; Recent Good  Judgment: Intact  Insight: Present   Executive Functions  Concentration: Good  Attention Span: Good  Recall: Good  Fund of Knowledge: Good  Language: Good   Psychomotor Activity  Psychomotor Activity:No data recorded  Assets  Assets: Communication Skills; Desire for Improvement; Housing; Catering manager; Social Support; Physical Health   Sleep  Sleep:No data recorded   Physical Exam: Physical Exam Vitals and nursing note reviewed.  Constitutional:      Appearance: Normal appearance.  HENT:     Head: Normocephalic and atraumatic.     Mouth/Throat:     Pharynx: Oropharynx is clear.  Eyes:     Pupils: Pupils are equal, round, and reactive to light.  Cardiovascular:     Rate and Rhythm: Normal rate and regular rhythm.  Pulmonary:     Effort: Pulmonary effort is normal.     Breath sounds: Normal breath sounds.  Abdominal:     General: Abdomen is flat.      Palpations: Abdomen is soft.  Musculoskeletal:        General: Normal range of motion.  Skin:    General: Skin is warm and dry.  Neurological:     General: No focal deficit present.     Mental Status: She is alert. Mental status is at baseline.  Psychiatric:        Attention and Perception: Attention normal.        Mood and Affect: Mood normal.        Speech: Speech normal.        Behavior: Behavior normal.        Thought Content: Thought content normal.        Cognition and Memory: Cognition normal.        Judgment: Judgment normal.    Review of Systems  Constitutional: Negative.   HENT: Negative.    Eyes: Negative.   Respiratory: Negative.    Cardiovascular: Negative.   Gastrointestinal: Negative.   Musculoskeletal: Negative.   Skin: Negative.   Neurological: Negative.   Psychiatric/Behavioral: Negative.     Blood pressure 112/65, pulse 82, temperature 98 F (36.7 C), temperature source Oral, resp. rate 20, height _0  (1.575 m), weight 58.5 kg, last menstrual period 12/24/2021, SpO2 93 %. Body mass index is 23.59 kg/m.   Social History   Tobacco Use  Smoking Status Every Day   Packs/day: 0.50   Years: 10.00   Total pack years: 5.00   Types: Cigarettes  Smokeless Tobacco Never   Tobacco Cessation:  A prescription for an FDA-approved tobacco cessation medication provided at discharge   Blood Alcohol level:  Lab Results  Component Value Date   ETH <10 04/29/2021   ETH <10 81/02/7508    Metabolic Disorder Labs:  Lab Results  Component Value Date   HGBA1C 5.4 01/20/2022   MPG 108 01/20/2022   MPG 96.8 02/07/2019   No results found for: "PROLACTIN" Lab Results  Component Value Date   CHOL 192 01/21/2022   TRIG 89 01/21/2022   HDL 56 01/21/2022   CHOLHDL 3.4 01/21/2022   VLDL 18 01/21/2022   LDLCALC 118 (H) 01/21/2022   [place] 67 11/05/2020    See Psychiatric Specialty Exam and Suicide Risk Assessment completed by Attending Physician prior to  discharge.  Discharge destination:  Home  Is patient on multiple antipsychotic therapies at discharge:  No   Has Patient had three or more failed trials of antipsychotic monotherapy by history:  No  Recommended Plan  for Multiple Antipsychotic Therapies: NA  Discharge Instructions     Diet - low sodium heart healthy   Complete by: As directed    Increase activity slowly   Complete by: As directed       Allergies as of 01/28/2022       Reactions   Pristiq [desvenlafaxine Succinate Er] Rash   Desvenlafaxine Rash   Other Rash   Tide detergent  Tide detergent  Tide detergent    Sulfa Antibiotics Rash        Medication List     STOP taking these medications    acetaminophen 325 MG tablet Commonly known as: TYLENOL   mirtazapine 15 MG tablet Commonly known as: REMERON   nicotine 21 mg/24hr patch Commonly known as: NICODERM CQ - dosed in mg/24 hours Replaced by: nicotine 14 mg/24hr patch       TAKE these medications      Indication  acyclovir ointment 5 % Commonly known as: ZOVIRAX Apply topically every 3 (three) hours.  Indication: Herpes Simplex Affecting the Lip   baclofen 10 MG tablet Commonly known as: LIORESAL Take 1 tablet (10 mg total) by mouth 3 (three) times daily.  Indication: Muscle Spasm   busPIRone 10 MG tablet Commonly known as: BUSPAR Take 1 tablet (10 mg total) by mouth 3 (three) times daily.  Indication: Anxiety Disorder, Major Depressive Disorder   docusate sodium 100 MG capsule Commonly known as: COLACE Take 1 capsule (100 mg total) by mouth 2 (two) times daily.  Indication: Constipation   gabapentin 300 MG capsule Commonly known as: Neurontin Take 1 capsule (300 mg total) by mouth at bedtime.  Indication: Sleep   hydrOXYzine 50 MG tablet Commonly known as: ATARAX Take 1 tablet (50 mg total) by mouth every 6 (six) hours as needed for anxiety.  Indication: Feeling Anxious   lamoTRIgine 25 MG tablet Commonly known as:  LAMICTAL Take 1 tablet (25 mg total) by mouth 2 (two) times daily.  Indication: Manic-Depression, Depressive Phase of Manic-Depression   naloxone 4 MG/0.1ML Liqd nasal spray kit Commonly known as: NARCAN ADMINISTER A SINGLE SPRAY IN ONE NOSTRIL UPON SIGNS OF OPIOID OVERDOSE. REPEAT EVERY 2 - 3 MINUTES IF NO RESPONSE IN ALTERNATING NOSTRIL TIL EMS ARRIVES.  Indication: Opioid Overdose   nicotine 14 mg/24hr patch Commonly known as: NICODERM CQ - dosed in mg/24 hours Place 1 patch (14 mg total) onto the skin daily. Replaces: nicotine 21 mg/24hr patch  Indication: Nicotine Addiction   OXcarbazepine 150 MG tablet Commonly known as: TRILEPTAL Take 1 tablet (150 mg total) by mouth 2 (two) times daily.  Indication: Bipolar depression   QUEtiapine 50 MG tablet Commonly known as: SEROquel Take 1 tablet (50 mg total) by mouth at bedtime.  Indication: Major Depressive Disorder   SUMAtriptan 50 MG tablet Commonly known as: IMITREX Take 1 tablet by mouth every 2 hours as needed for migraine. May repeat in 2 hours if headache persists or recurs.  Indication: Migraine Headache        Follow-up Information     Narcotics Anonymous (NA) Follow up.   Why: Please go to [email or web address] in order to see schedule for NA and AA meetings in your area.        [facility]. Go on 02/21/2022.   Specialty: Behavioral Health Why: Please see your therapist on Fri 1/12 at 1100am for hospital discharge appointment, medication management appointment will be scheduled at this time. Contact information: [address] [place] [address] [phone]  Follow-up recommendations:  Other:  Patient is to follow-up with outpatient treatment for substance abuse and depression.  Prescriptions provided.  Psychoeducation provided.  Patient denying any suicidal thoughts and appears to be stable with her behavior and affect.  Comments: See above.  Prescriptions and supply of medicine  prescribed  Signed: Alethia Berthold, MD 01/28/2022, 11:26 AM

## 2022-01-28 NOTE — Progress Notes (Signed)
Recreation Therapy Notes   Date: 01/28/2022  Time: 10:05 am    Location: Craft room     Behavioral response: N/A   Intervention Topic: Teamwork    Discussion/Intervention: Patient refused to attend group.   Clinical Observations/Feedback:  Patient refused to attend group.    Kemaria Dedic LRT/CTRS        Jaquin Coy 01/28/2022 11:09 AM 

## 2022-01-28 NOTE — Progress Notes (Signed)
Patient stated that she will take her scheduled, morning medication once she gets up. MD was made aware during progression rounds and is ok with this.

## 2022-01-29 ENCOUNTER — Telehealth: Payer: Self-pay

## 2022-01-29 NOTE — Patient Outreach (Signed)
  Care Coordination TOC Note Transition Care Management Unsuccessful Follow-up Telephone Call  Date of discharge and from where:  01/28/22-[facility]  Attempts:  1st Attempt  Reason for unsuccessful TCM follow-up call:  No answer/busy      Antionette Fairy, RN,BSN,CCM [facility] Care Management Telephonic Care Management Coordinator Direct Phone: [phone] Toll Free: [phone] Fax: [phone]

## 2022-01-30 ENCOUNTER — Telehealth: Payer: Self-pay | Admitting: *Deleted

## 2022-01-30 NOTE — Patient Outreach (Signed)
  Care Coordination [facility] Note Transition Care Management Unsuccessful Follow-up Telephone Call  Date of discharge and from where:  [phone] [facility]  Attempts:  2nd Attempt  Reason for unsuccessful TCM follow-up call:  Left voice message  Gean Maidens BSN RN [facility] Care Management [phone]

## 2022-01-31 ENCOUNTER — Telehealth: Payer: Self-pay

## 2022-01-31 NOTE — Patient Outreach (Signed)
  Care Coordination TOC Note Transition Care Management Unsuccessful Follow-up Telephone Call  Date of discharge and from where:  01/28/22-[facility]  Attempts:  3rd Attempt  Reason for unsuccessful TCM follow-up call:  Unable to reach patient     Alessandra Grout [facility] Care Management Telephonic Care Management Coordinator Direct Phone: [phone] Toll Free: [phone] Fax: [phone]

## 2022-02-09 DIAGNOSIS — R69 Illness, unspecified: Secondary | ICD-10-CM | POA: Diagnosis not present

## 2022-02-09 DIAGNOSIS — F1420 Cocaine dependence, uncomplicated: Secondary | ICD-10-CM | POA: Diagnosis not present

## 2022-02-09 DIAGNOSIS — F4310 Post-traumatic stress disorder, unspecified: Secondary | ICD-10-CM | POA: Diagnosis not present

## 2022-02-09 DIAGNOSIS — F419 Anxiety disorder, unspecified: Secondary | ICD-10-CM | POA: Diagnosis not present

## 2022-02-09 DIAGNOSIS — F17200 Nicotine dependence, unspecified, uncomplicated: Secondary | ICD-10-CM | POA: Diagnosis not present

## 2022-02-09 DIAGNOSIS — F319 Bipolar disorder, unspecified: Secondary | ICD-10-CM | POA: Diagnosis not present

## 2022-02-09 DIAGNOSIS — F1220 Cannabis dependence, uncomplicated: Secondary | ICD-10-CM | POA: Diagnosis not present

## 2022-02-10 DIAGNOSIS — F419 Anxiety disorder, unspecified: Secondary | ICD-10-CM | POA: Diagnosis not present

## 2022-02-10 DIAGNOSIS — F319 Bipolar disorder, unspecified: Secondary | ICD-10-CM | POA: Diagnosis not present

## 2022-02-10 DIAGNOSIS — F4310 Post-traumatic stress disorder, unspecified: Secondary | ICD-10-CM | POA: Diagnosis not present

## 2022-02-10 DIAGNOSIS — R69 Illness, unspecified: Secondary | ICD-10-CM | POA: Diagnosis not present

## 2022-02-10 DIAGNOSIS — F1420 Cocaine dependence, uncomplicated: Secondary | ICD-10-CM | POA: Diagnosis not present

## 2022-02-10 DIAGNOSIS — F17200 Nicotine dependence, unspecified, uncomplicated: Secondary | ICD-10-CM | POA: Diagnosis not present

## 2022-02-11 DIAGNOSIS — R69 Illness, unspecified: Secondary | ICD-10-CM | POA: Diagnosis not present

## 2022-02-11 DIAGNOSIS — F17200 Nicotine dependence, unspecified, uncomplicated: Secondary | ICD-10-CM | POA: Diagnosis not present

## 2022-02-11 DIAGNOSIS — F319 Bipolar disorder, unspecified: Secondary | ICD-10-CM | POA: Diagnosis not present

## 2022-02-11 DIAGNOSIS — F4310 Post-traumatic stress disorder, unspecified: Secondary | ICD-10-CM | POA: Diagnosis not present

## 2022-02-11 DIAGNOSIS — F1420 Cocaine dependence, uncomplicated: Secondary | ICD-10-CM | POA: Diagnosis not present

## 2022-02-11 DIAGNOSIS — F419 Anxiety disorder, unspecified: Secondary | ICD-10-CM | POA: Diagnosis not present

## 2022-02-12 DIAGNOSIS — R69 Illness, unspecified: Secondary | ICD-10-CM | POA: Diagnosis not present

## 2022-02-12 DIAGNOSIS — F17200 Nicotine dependence, unspecified, uncomplicated: Secondary | ICD-10-CM | POA: Diagnosis not present

## 2022-02-12 DIAGNOSIS — F1420 Cocaine dependence, uncomplicated: Secondary | ICD-10-CM | POA: Diagnosis not present

## 2022-02-12 DIAGNOSIS — F319 Bipolar disorder, unspecified: Secondary | ICD-10-CM | POA: Diagnosis not present

## 2022-02-12 DIAGNOSIS — F419 Anxiety disorder, unspecified: Secondary | ICD-10-CM | POA: Diagnosis not present

## 2022-02-12 DIAGNOSIS — F4310 Post-traumatic stress disorder, unspecified: Secondary | ICD-10-CM | POA: Diagnosis not present

## 2022-02-13 DIAGNOSIS — F1420 Cocaine dependence, uncomplicated: Secondary | ICD-10-CM | POA: Diagnosis not present

## 2022-02-13 DIAGNOSIS — R69 Illness, unspecified: Secondary | ICD-10-CM | POA: Diagnosis not present

## 2022-02-13 DIAGNOSIS — F419 Anxiety disorder, unspecified: Secondary | ICD-10-CM | POA: Diagnosis not present

## 2022-02-13 DIAGNOSIS — F17200 Nicotine dependence, unspecified, uncomplicated: Secondary | ICD-10-CM | POA: Diagnosis not present

## 2022-02-13 DIAGNOSIS — F319 Bipolar disorder, unspecified: Secondary | ICD-10-CM | POA: Diagnosis not present

## 2022-02-13 DIAGNOSIS — F4310 Post-traumatic stress disorder, unspecified: Secondary | ICD-10-CM | POA: Diagnosis not present

## 2022-02-14 DIAGNOSIS — F17200 Nicotine dependence, unspecified, uncomplicated: Secondary | ICD-10-CM | POA: Diagnosis not present

## 2022-02-14 DIAGNOSIS — F1420 Cocaine dependence, uncomplicated: Secondary | ICD-10-CM | POA: Diagnosis not present

## 2022-02-14 DIAGNOSIS — F1220 Cannabis dependence, uncomplicated: Secondary | ICD-10-CM | POA: Diagnosis not present

## 2022-02-14 DIAGNOSIS — F419 Anxiety disorder, unspecified: Secondary | ICD-10-CM | POA: Diagnosis not present

## 2022-02-14 DIAGNOSIS — R69 Illness, unspecified: Secondary | ICD-10-CM | POA: Diagnosis not present

## 2022-02-14 DIAGNOSIS — F4310 Post-traumatic stress disorder, unspecified: Secondary | ICD-10-CM | POA: Diagnosis not present

## 2022-02-14 DIAGNOSIS — F319 Bipolar disorder, unspecified: Secondary | ICD-10-CM | POA: Diagnosis not present

## 2022-02-15 DIAGNOSIS — F17200 Nicotine dependence, unspecified, uncomplicated: Secondary | ICD-10-CM | POA: Diagnosis not present

## 2022-02-15 DIAGNOSIS — R69 Illness, unspecified: Secondary | ICD-10-CM | POA: Diagnosis not present

## 2022-02-15 DIAGNOSIS — F419 Anxiety disorder, unspecified: Secondary | ICD-10-CM | POA: Diagnosis not present

## 2022-02-15 DIAGNOSIS — F1420 Cocaine dependence, uncomplicated: Secondary | ICD-10-CM | POA: Diagnosis not present

## 2022-02-15 DIAGNOSIS — F319 Bipolar disorder, unspecified: Secondary | ICD-10-CM | POA: Diagnosis not present

## 2022-02-15 DIAGNOSIS — F4310 Post-traumatic stress disorder, unspecified: Secondary | ICD-10-CM | POA: Diagnosis not present

## 2022-02-15 DIAGNOSIS — F1220 Cannabis dependence, uncomplicated: Secondary | ICD-10-CM | POA: Diagnosis not present

## 2022-02-16 DIAGNOSIS — F17200 Nicotine dependence, unspecified, uncomplicated: Secondary | ICD-10-CM | POA: Diagnosis not present

## 2022-02-16 DIAGNOSIS — F1420 Cocaine dependence, uncomplicated: Secondary | ICD-10-CM | POA: Diagnosis not present

## 2022-02-16 DIAGNOSIS — F319 Bipolar disorder, unspecified: Secondary | ICD-10-CM | POA: Diagnosis not present

## 2022-02-16 DIAGNOSIS — F419 Anxiety disorder, unspecified: Secondary | ICD-10-CM | POA: Diagnosis not present

## 2022-02-16 DIAGNOSIS — F1220 Cannabis dependence, uncomplicated: Secondary | ICD-10-CM | POA: Diagnosis not present

## 2022-02-16 DIAGNOSIS — F4310 Post-traumatic stress disorder, unspecified: Secondary | ICD-10-CM | POA: Diagnosis not present

## 2022-02-16 DIAGNOSIS — R69 Illness, unspecified: Secondary | ICD-10-CM | POA: Diagnosis not present

## 2022-02-17 DIAGNOSIS — F419 Anxiety disorder, unspecified: Secondary | ICD-10-CM | POA: Diagnosis not present

## 2022-02-17 DIAGNOSIS — F319 Bipolar disorder, unspecified: Secondary | ICD-10-CM | POA: Diagnosis not present

## 2022-02-17 DIAGNOSIS — F1220 Cannabis dependence, uncomplicated: Secondary | ICD-10-CM | POA: Diagnosis not present

## 2022-02-17 DIAGNOSIS — R69 Illness, unspecified: Secondary | ICD-10-CM | POA: Diagnosis not present

## 2022-02-17 DIAGNOSIS — F17200 Nicotine dependence, unspecified, uncomplicated: Secondary | ICD-10-CM | POA: Diagnosis not present

## 2022-02-17 DIAGNOSIS — F1420 Cocaine dependence, uncomplicated: Secondary | ICD-10-CM | POA: Diagnosis not present

## 2022-02-17 DIAGNOSIS — F4310 Post-traumatic stress disorder, unspecified: Secondary | ICD-10-CM | POA: Diagnosis not present

## 2022-02-18 DIAGNOSIS — F319 Bipolar disorder, unspecified: Secondary | ICD-10-CM | POA: Diagnosis not present

## 2022-02-18 DIAGNOSIS — F1420 Cocaine dependence, uncomplicated: Secondary | ICD-10-CM | POA: Diagnosis not present

## 2022-02-18 DIAGNOSIS — F419 Anxiety disorder, unspecified: Secondary | ICD-10-CM | POA: Diagnosis not present

## 2022-02-18 DIAGNOSIS — F4310 Post-traumatic stress disorder, unspecified: Secondary | ICD-10-CM | POA: Diagnosis not present

## 2022-02-18 DIAGNOSIS — F1220 Cannabis dependence, uncomplicated: Secondary | ICD-10-CM | POA: Diagnosis not present

## 2022-02-18 DIAGNOSIS — R69 Illness, unspecified: Secondary | ICD-10-CM | POA: Diagnosis not present

## 2022-02-18 DIAGNOSIS — F17200 Nicotine dependence, unspecified, uncomplicated: Secondary | ICD-10-CM | POA: Diagnosis not present

## 2022-02-19 DIAGNOSIS — F1420 Cocaine dependence, uncomplicated: Secondary | ICD-10-CM | POA: Diagnosis not present

## 2022-02-19 DIAGNOSIS — F1220 Cannabis dependence, uncomplicated: Secondary | ICD-10-CM | POA: Diagnosis not present

## 2022-02-19 DIAGNOSIS — R69 Illness, unspecified: Secondary | ICD-10-CM | POA: Diagnosis not present

## 2022-02-19 DIAGNOSIS — F17200 Nicotine dependence, unspecified, uncomplicated: Secondary | ICD-10-CM | POA: Diagnosis not present

## 2022-02-19 DIAGNOSIS — F319 Bipolar disorder, unspecified: Secondary | ICD-10-CM | POA: Diagnosis not present

## 2022-02-19 DIAGNOSIS — F4310 Post-traumatic stress disorder, unspecified: Secondary | ICD-10-CM | POA: Diagnosis not present

## 2022-02-19 DIAGNOSIS — F419 Anxiety disorder, unspecified: Secondary | ICD-10-CM | POA: Diagnosis not present

## 2022-02-20 DIAGNOSIS — F319 Bipolar disorder, unspecified: Secondary | ICD-10-CM | POA: Diagnosis not present

## 2022-02-20 DIAGNOSIS — F17200 Nicotine dependence, unspecified, uncomplicated: Secondary | ICD-10-CM | POA: Diagnosis not present

## 2022-02-20 DIAGNOSIS — R69 Illness, unspecified: Secondary | ICD-10-CM | POA: Diagnosis not present

## 2022-02-20 DIAGNOSIS — F4310 Post-traumatic stress disorder, unspecified: Secondary | ICD-10-CM | POA: Diagnosis not present

## 2022-02-20 DIAGNOSIS — F1420 Cocaine dependence, uncomplicated: Secondary | ICD-10-CM | POA: Diagnosis not present

## 2022-02-20 DIAGNOSIS — F419 Anxiety disorder, unspecified: Secondary | ICD-10-CM | POA: Diagnosis not present

## 2022-02-20 DIAGNOSIS — F1220 Cannabis dependence, uncomplicated: Secondary | ICD-10-CM | POA: Diagnosis not present

## 2022-02-21 DIAGNOSIS — R69 Illness, unspecified: Secondary | ICD-10-CM | POA: Diagnosis not present

## 2022-02-21 DIAGNOSIS — F17200 Nicotine dependence, unspecified, uncomplicated: Secondary | ICD-10-CM | POA: Diagnosis not present

## 2022-02-21 DIAGNOSIS — F319 Bipolar disorder, unspecified: Secondary | ICD-10-CM | POA: Diagnosis not present

## 2022-02-21 DIAGNOSIS — F419 Anxiety disorder, unspecified: Secondary | ICD-10-CM | POA: Diagnosis not present

## 2022-02-21 DIAGNOSIS — F1220 Cannabis dependence, uncomplicated: Secondary | ICD-10-CM | POA: Diagnosis not present

## 2022-02-21 DIAGNOSIS — F1420 Cocaine dependence, uncomplicated: Secondary | ICD-10-CM | POA: Diagnosis not present

## 2022-02-21 DIAGNOSIS — F4310 Post-traumatic stress disorder, unspecified: Secondary | ICD-10-CM | POA: Diagnosis not present

## 2022-02-22 DIAGNOSIS — F1220 Cannabis dependence, uncomplicated: Secondary | ICD-10-CM | POA: Diagnosis not present

## 2022-02-22 DIAGNOSIS — F17200 Nicotine dependence, unspecified, uncomplicated: Secondary | ICD-10-CM | POA: Diagnosis not present

## 2022-02-22 DIAGNOSIS — R69 Illness, unspecified: Secondary | ICD-10-CM | POA: Diagnosis not present

## 2022-02-22 DIAGNOSIS — F1420 Cocaine dependence, uncomplicated: Secondary | ICD-10-CM | POA: Diagnosis not present

## 2022-02-22 DIAGNOSIS — F319 Bipolar disorder, unspecified: Secondary | ICD-10-CM | POA: Diagnosis not present

## 2022-02-22 DIAGNOSIS — F419 Anxiety disorder, unspecified: Secondary | ICD-10-CM | POA: Diagnosis not present

## 2022-02-22 DIAGNOSIS — F4310 Post-traumatic stress disorder, unspecified: Secondary | ICD-10-CM | POA: Diagnosis not present

## 2022-02-23 DIAGNOSIS — F419 Anxiety disorder, unspecified: Secondary | ICD-10-CM | POA: Diagnosis not present

## 2022-02-23 DIAGNOSIS — F4310 Post-traumatic stress disorder, unspecified: Secondary | ICD-10-CM | POA: Diagnosis not present

## 2022-02-23 DIAGNOSIS — F1220 Cannabis dependence, uncomplicated: Secondary | ICD-10-CM | POA: Diagnosis not present

## 2022-02-23 DIAGNOSIS — F1420 Cocaine dependence, uncomplicated: Secondary | ICD-10-CM | POA: Diagnosis not present

## 2022-02-23 DIAGNOSIS — F319 Bipolar disorder, unspecified: Secondary | ICD-10-CM | POA: Diagnosis not present

## 2022-02-23 DIAGNOSIS — R69 Illness, unspecified: Secondary | ICD-10-CM | POA: Diagnosis not present

## 2022-02-23 DIAGNOSIS — F17200 Nicotine dependence, unspecified, uncomplicated: Secondary | ICD-10-CM | POA: Diagnosis not present

## 2022-02-24 DIAGNOSIS — F1420 Cocaine dependence, uncomplicated: Secondary | ICD-10-CM | POA: Diagnosis not present

## 2022-02-24 DIAGNOSIS — F4310 Post-traumatic stress disorder, unspecified: Secondary | ICD-10-CM | POA: Diagnosis not present

## 2022-02-24 DIAGNOSIS — F319 Bipolar disorder, unspecified: Secondary | ICD-10-CM | POA: Diagnosis not present

## 2022-02-24 DIAGNOSIS — F419 Anxiety disorder, unspecified: Secondary | ICD-10-CM | POA: Diagnosis not present

## 2022-02-24 DIAGNOSIS — F1220 Cannabis dependence, uncomplicated: Secondary | ICD-10-CM | POA: Diagnosis not present

## 2022-02-24 DIAGNOSIS — F17200 Nicotine dependence, unspecified, uncomplicated: Secondary | ICD-10-CM | POA: Diagnosis not present

## 2022-02-24 DIAGNOSIS — R69 Illness, unspecified: Secondary | ICD-10-CM | POA: Diagnosis not present

## 2022-02-25 DIAGNOSIS — R69 Illness, unspecified: Secondary | ICD-10-CM | POA: Diagnosis not present

## 2022-02-25 DIAGNOSIS — F1220 Cannabis dependence, uncomplicated: Secondary | ICD-10-CM | POA: Diagnosis not present

## 2022-02-25 DIAGNOSIS — F4310 Post-traumatic stress disorder, unspecified: Secondary | ICD-10-CM | POA: Diagnosis not present

## 2022-02-25 DIAGNOSIS — F319 Bipolar disorder, unspecified: Secondary | ICD-10-CM | POA: Diagnosis not present

## 2022-02-25 DIAGNOSIS — F1420 Cocaine dependence, uncomplicated: Secondary | ICD-10-CM | POA: Diagnosis not present

## 2022-02-25 DIAGNOSIS — F17200 Nicotine dependence, unspecified, uncomplicated: Secondary | ICD-10-CM | POA: Diagnosis not present

## 2022-02-25 DIAGNOSIS — F419 Anxiety disorder, unspecified: Secondary | ICD-10-CM | POA: Diagnosis not present

## 2022-02-26 DIAGNOSIS — F4310 Post-traumatic stress disorder, unspecified: Secondary | ICD-10-CM | POA: Diagnosis not present

## 2022-02-26 DIAGNOSIS — F319 Bipolar disorder, unspecified: Secondary | ICD-10-CM | POA: Diagnosis not present

## 2022-02-26 DIAGNOSIS — F1220 Cannabis dependence, uncomplicated: Secondary | ICD-10-CM | POA: Diagnosis not present

## 2022-02-26 DIAGNOSIS — F17200 Nicotine dependence, unspecified, uncomplicated: Secondary | ICD-10-CM | POA: Diagnosis not present

## 2022-02-26 DIAGNOSIS — F419 Anxiety disorder, unspecified: Secondary | ICD-10-CM | POA: Diagnosis not present

## 2022-02-26 DIAGNOSIS — F1420 Cocaine dependence, uncomplicated: Secondary | ICD-10-CM | POA: Diagnosis not present

## 2022-02-26 DIAGNOSIS — R69 Illness, unspecified: Secondary | ICD-10-CM | POA: Diagnosis not present

## 2022-02-27 DIAGNOSIS — F4310 Post-traumatic stress disorder, unspecified: Secondary | ICD-10-CM | POA: Diagnosis not present

## 2022-02-27 DIAGNOSIS — R69 Illness, unspecified: Secondary | ICD-10-CM | POA: Diagnosis not present

## 2022-02-27 DIAGNOSIS — F1420 Cocaine dependence, uncomplicated: Secondary | ICD-10-CM | POA: Diagnosis not present

## 2022-02-27 DIAGNOSIS — F419 Anxiety disorder, unspecified: Secondary | ICD-10-CM | POA: Diagnosis not present

## 2022-02-27 DIAGNOSIS — F319 Bipolar disorder, unspecified: Secondary | ICD-10-CM | POA: Diagnosis not present

## 2022-02-27 DIAGNOSIS — F1220 Cannabis dependence, uncomplicated: Secondary | ICD-10-CM | POA: Diagnosis not present

## 2022-02-27 DIAGNOSIS — F17200 Nicotine dependence, unspecified, uncomplicated: Secondary | ICD-10-CM | POA: Diagnosis not present

## 2022-02-28 DIAGNOSIS — R69 Illness, unspecified: Secondary | ICD-10-CM | POA: Diagnosis not present

## 2022-02-28 DIAGNOSIS — F1420 Cocaine dependence, uncomplicated: Secondary | ICD-10-CM | POA: Diagnosis not present

## 2022-02-28 DIAGNOSIS — F17200 Nicotine dependence, unspecified, uncomplicated: Secondary | ICD-10-CM | POA: Diagnosis not present

## 2022-02-28 DIAGNOSIS — F4310 Post-traumatic stress disorder, unspecified: Secondary | ICD-10-CM | POA: Diagnosis not present

## 2022-02-28 DIAGNOSIS — F319 Bipolar disorder, unspecified: Secondary | ICD-10-CM | POA: Diagnosis not present

## 2022-02-28 DIAGNOSIS — F419 Anxiety disorder, unspecified: Secondary | ICD-10-CM | POA: Diagnosis not present

## 2022-02-28 DIAGNOSIS — F1220 Cannabis dependence, uncomplicated: Secondary | ICD-10-CM | POA: Diagnosis not present

## 2022-03-01 DIAGNOSIS — F419 Anxiety disorder, unspecified: Secondary | ICD-10-CM | POA: Diagnosis not present

## 2022-03-01 DIAGNOSIS — F4310 Post-traumatic stress disorder, unspecified: Secondary | ICD-10-CM | POA: Diagnosis not present

## 2022-03-01 DIAGNOSIS — F1220 Cannabis dependence, uncomplicated: Secondary | ICD-10-CM | POA: Diagnosis not present

## 2022-03-01 DIAGNOSIS — F17200 Nicotine dependence, unspecified, uncomplicated: Secondary | ICD-10-CM | POA: Diagnosis not present

## 2022-03-01 DIAGNOSIS — F1420 Cocaine dependence, uncomplicated: Secondary | ICD-10-CM | POA: Diagnosis not present

## 2022-03-01 DIAGNOSIS — F319 Bipolar disorder, unspecified: Secondary | ICD-10-CM | POA: Diagnosis not present

## 2022-03-01 DIAGNOSIS — R69 Illness, unspecified: Secondary | ICD-10-CM | POA: Diagnosis not present

## 2022-03-02 DIAGNOSIS — F1220 Cannabis dependence, uncomplicated: Secondary | ICD-10-CM | POA: Diagnosis not present

## 2022-03-02 DIAGNOSIS — F17200 Nicotine dependence, unspecified, uncomplicated: Secondary | ICD-10-CM | POA: Diagnosis not present

## 2022-03-02 DIAGNOSIS — R69 Illness, unspecified: Secondary | ICD-10-CM | POA: Diagnosis not present

## 2022-03-02 DIAGNOSIS — F1420 Cocaine dependence, uncomplicated: Secondary | ICD-10-CM | POA: Diagnosis not present

## 2022-03-02 DIAGNOSIS — F419 Anxiety disorder, unspecified: Secondary | ICD-10-CM | POA: Diagnosis not present

## 2022-03-02 DIAGNOSIS — F4310 Post-traumatic stress disorder, unspecified: Secondary | ICD-10-CM | POA: Diagnosis not present

## 2022-03-02 DIAGNOSIS — F319 Bipolar disorder, unspecified: Secondary | ICD-10-CM | POA: Diagnosis not present

## 2022-03-03 DIAGNOSIS — F1420 Cocaine dependence, uncomplicated: Secondary | ICD-10-CM | POA: Diagnosis not present

## 2022-03-03 DIAGNOSIS — R69 Illness, unspecified: Secondary | ICD-10-CM | POA: Diagnosis not present

## 2022-03-03 DIAGNOSIS — F4310 Post-traumatic stress disorder, unspecified: Secondary | ICD-10-CM | POA: Diagnosis not present

## 2022-03-03 DIAGNOSIS — F1220 Cannabis dependence, uncomplicated: Secondary | ICD-10-CM | POA: Diagnosis not present

## 2022-03-03 DIAGNOSIS — F319 Bipolar disorder, unspecified: Secondary | ICD-10-CM | POA: Diagnosis not present

## 2022-03-03 DIAGNOSIS — F17200 Nicotine dependence, unspecified, uncomplicated: Secondary | ICD-10-CM | POA: Diagnosis not present

## 2022-03-03 DIAGNOSIS — F419 Anxiety disorder, unspecified: Secondary | ICD-10-CM | POA: Diagnosis not present

## 2022-04-08 ENCOUNTER — Other Ambulatory Visit: Payer: Self-pay

## 2022-04-08 ENCOUNTER — Encounter: Payer: Medicare HMO | Attending: Physical Medicine and Rehabilitation | Admitting: Physical Medicine and Rehabilitation

## 2022-04-08 ENCOUNTER — Emergency Department
Admission: EM | Admit: 2022-04-08 | Discharge: 2022-04-08 | Disposition: A | Discharge status: Home / Self Care | Payer: Medicare HMO | Attending: Emergency Medicine | Admitting: Emergency Medicine

## 2022-04-08 DIAGNOSIS — G40909 Epilepsy, unspecified, not intractable, without status epilepticus: Secondary | ICD-10-CM | POA: Diagnosis not present

## 2022-04-08 DIAGNOSIS — R69 Illness, unspecified: Secondary | ICD-10-CM | POA: Diagnosis not present

## 2022-04-08 DIAGNOSIS — F1210 Cannabis abuse, uncomplicated: Secondary | ICD-10-CM | POA: Diagnosis not present

## 2022-04-08 DIAGNOSIS — R5383 Other fatigue: Secondary | ICD-10-CM | POA: Diagnosis not present

## 2022-04-08 DIAGNOSIS — R569 Unspecified convulsions: Secondary | ICD-10-CM | POA: Diagnosis not present

## 2022-04-08 LAB — URINE DRUG SCREEN, QUALITATIVE (ARMC ONLY)
Amphetamines, Ur Screen: NOT DETECTED
Barbiturates, Ur Screen: NOT DETECTED
Benzodiazepine, Ur Scrn: NOT DETECTED
Cannabinoid 50 Ng, Ur ~~LOC~~: POSITIVE — AB
Cocaine Metabolite,Ur ~~LOC~~: NOT DETECTED
MDMA (Ecstasy)Ur Screen: NOT DETECTED
Methadone Scn, Ur: NOT DETECTED
Opiate, Ur Screen: NOT DETECTED
Phencyclidine (PCP) Ur S: NOT DETECTED
Tricyclic, Ur Screen: NOT DETECTED

## 2022-04-08 LAB — POC URINE PREG, ED: Preg Test, Ur: NEGATIVE

## 2022-04-08 LAB — CBG MONITORING, ED: Glucose-Capillary: 105 mg/dL — ABNORMAL HIGH (ref 70–99)

## 2022-04-08 NOTE — ED Triage Notes (Signed)
Pt bib significant other, pt reports "l remember lying on the couch, started shaking and drooling". Per boyfriend, pt's eyes were open but not talking. Pt states srugs trigger seizure but hasn't had any drugs for 1 month (THC pill). H/O Seizure, missed dose today. Pt ao x 4 in triage, reports feeling tired/dizzy.

## 2022-04-08 NOTE — ED Provider Notes (Signed)
   [facility] Provider Note    Event Date/Time   First MD Initiated Contact with Patient 04/08/22 1429     (approximate)   History   Seizures   HPI  Palyn Wedekind is a 27 y.o. female with a history of seizure disorder who takes Lamictal, reports compliance with her medications, think she had a seizure this morning around 11:00, typical seizure, feels fatigued now which is typical postictal phase for her.  Has no other complaints.     Physical Exam   Triage Vital Signs: ED Triage Vitals  Enc Vitals Group     BP 04/08/22 1318 118/78     Pulse Rate 04/08/22 1318 90     Resp 04/08/22 1318 15     Temp 04/08/22 1318 98 F (36.7 C)     Temp Source 04/08/22 1318 Oral     SpO2 04/08/22 1318 96 %     Weight 04/08/22 1317 59 kg (130 lb)     Height 04/08/22 1317 1.575 m (5' 2"$ )     Head Circumference --      Peak Flow --      Pain Score 04/08/22 1316 6     Pain Loc --      Pain Edu? --      Excl. in [place]? --     Most recent vital signs: Vitals:   04/08/22 1318  BP: 118/78  Pulse: 90  Resp: 15  Temp: 98 F (36.7 C)  SpO2: 96%     General: Awake, no distress.  CV:  Good peripheral perfusion.  Resp:  Normal effort.  Abd:  No distention.  Other:    ED Results / Procedures / Treatments   Labs (all labs ordered are listed, but only abnormal results are displayed) Labs Reviewed  CBG MONITORING, ED - Abnormal; Notable for the following components:      Result Value   Glucose-Capillary 105 (*)    All other components within normal limits  URINE DRUG SCREEN, QUALITATIVE ([facility] ONLY)  POC URINE PREG, ED     EKG     RADIOLOGY     PROCEDURES:  Critical Care performed:   Procedures   MEDICATIONS ORDERED IN ED: Medications - No data to display   IMPRESSION / MDM / [place] / ED COURSE  I reviewed the triage vital signs and the nursing notes. Patient's presentation is most consistent with exacerbation of chronic  illness.  Patient presents after likely seizure, she has a history of a seizure disorder and does take Lamictal.  Reportedly has been compliant but has not increased her dose to 50 mg yet.  She reports she is undergoing lots of stress because she is moving to [employer].  Denies drug use  No further seizure activity.  She has requested urine drug screen to prove to her parents that she does not use any drugs        FINAL CLINICAL IMPRESSION(S) / ED DIAGNOSES   Final diagnoses:  Seizure ([place])     Rx / DC Orders   ED Discharge Orders     None        Note:  This document was prepared using Dragon voice recognition software and may include unintentional dictation errors.   Lavonia Drafts, MD 04/08/22 [phone]

## 2022-04-16 ENCOUNTER — Telehealth: Payer: Self-pay

## 2022-04-16 NOTE — Telephone Encounter (Signed)
        Patient  visited [place] on 2/27     Telephone encounter attempt :  1st  A HIPAA compliant voice message was left requesting a return call.  Instructed patient to call back .    [place] [phone] [address], [place], [place] [address] Phone: [phone] Email: [email or web address]

## 2022-04-17 ENCOUNTER — Telehealth: Payer: Self-pay

## 2022-04-17 NOTE — Telephone Encounter (Signed)
        Patient  visited [place] on 2/27     Telephone encounter attempt :  2nd  A HIPAA compliant voice message was left requesting a return call.  Instructed patient to call back.    [place] [phone] [address], [place], [place] [address] Phone: [phone] Email: [email or web address]

## 2022-05-14 DIAGNOSIS — F1910 Other psychoactive substance abuse, uncomplicated: Secondary | ICD-10-CM | POA: Diagnosis not present

## 2022-05-14 DIAGNOSIS — R69 Illness, unspecified: Secondary | ICD-10-CM | POA: Diagnosis not present

## 2022-05-14 DIAGNOSIS — Z5181 Encounter for therapeutic drug level monitoring: Secondary | ICD-10-CM | POA: Diagnosis not present

## 2022-05-14 DIAGNOSIS — Z79899 Other long term (current) drug therapy: Secondary | ICD-10-CM | POA: Diagnosis not present

## 2022-05-14 DIAGNOSIS — G40909 Epilepsy, unspecified, not intractable, without status epilepticus: Secondary | ICD-10-CM | POA: Diagnosis not present

## 2022-05-14 DIAGNOSIS — E872 Acidosis, unspecified: Secondary | ICD-10-CM | POA: Diagnosis not present

## 2022-05-14 DIAGNOSIS — R000 Tachycardia, unspecified: Secondary | ICD-10-CM | POA: Diagnosis not present

## 2022-05-14 DIAGNOSIS — R569 Unspecified convulsions: Secondary | ICD-10-CM | POA: Diagnosis not present

## 2022-05-28 DIAGNOSIS — F1190 Opioid use, unspecified, uncomplicated: Secondary | ICD-10-CM | POA: Diagnosis not present

## 2022-05-28 DIAGNOSIS — R69 Illness, unspecified: Secondary | ICD-10-CM | POA: Diagnosis not present

## 2022-06-05 DIAGNOSIS — Z1331 Encounter for screening for depression: Secondary | ICD-10-CM | POA: Diagnosis not present

## 2022-06-05 DIAGNOSIS — K219 Gastro-esophageal reflux disease without esophagitis: Secondary | ICD-10-CM | POA: Diagnosis not present

## 2022-06-05 DIAGNOSIS — R569 Unspecified convulsions: Secondary | ICD-10-CM | POA: Diagnosis not present

## 2022-06-05 DIAGNOSIS — F319 Bipolar disorder, unspecified: Secondary | ICD-10-CM | POA: Diagnosis not present

## 2022-06-05 DIAGNOSIS — Z1330 Encounter for screening examination for mental health and behavioral disorders, unspecified: Secondary | ICD-10-CM | POA: Diagnosis not present

## 2022-06-05 DIAGNOSIS — I69354 Hemiplegia and hemiparesis following cerebral infarction affecting left non-dominant side: Secondary | ICD-10-CM | POA: Diagnosis not present

## 2022-06-05 DIAGNOSIS — F4010 Social phobia, unspecified: Secondary | ICD-10-CM | POA: Diagnosis not present

## 2022-06-05 DIAGNOSIS — M62838 Other muscle spasm: Secondary | ICD-10-CM | POA: Diagnosis not present

## 2022-06-09 DIAGNOSIS — F1190 Opioid use, unspecified, uncomplicated: Secondary | ICD-10-CM | POA: Diagnosis not present

## 2022-06-12 DIAGNOSIS — F1121 Opioid dependence, in remission: Secondary | ICD-10-CM | POA: Diagnosis not present

## 2022-06-30 DIAGNOSIS — F17210 Nicotine dependence, cigarettes, uncomplicated: Secondary | ICD-10-CM | POA: Diagnosis not present

## 2022-06-30 DIAGNOSIS — F17200 Nicotine dependence, unspecified, uncomplicated: Secondary | ICD-10-CM | POA: Diagnosis not present

## 2022-06-30 DIAGNOSIS — R002 Palpitations: Secondary | ICD-10-CM | POA: Diagnosis not present

## 2022-06-30 DIAGNOSIS — R0789 Other chest pain: Secondary | ICD-10-CM | POA: Diagnosis not present

## 2022-06-30 DIAGNOSIS — Z720 Tobacco use: Secondary | ICD-10-CM | POA: Diagnosis not present

## 2022-06-30 DIAGNOSIS — I6389 Other cerebral infarction: Secondary | ICD-10-CM | POA: Diagnosis not present

## 2022-07-10 DIAGNOSIS — F1420 Cocaine dependence, uncomplicated: Secondary | ICD-10-CM | POA: Diagnosis not present

## 2022-07-10 DIAGNOSIS — F1410 Cocaine abuse, uncomplicated: Secondary | ICD-10-CM | POA: Diagnosis not present

## 2022-07-16 DIAGNOSIS — F1420 Cocaine dependence, uncomplicated: Secondary | ICD-10-CM | POA: Diagnosis not present

## 2022-07-17 DIAGNOSIS — F1420 Cocaine dependence, uncomplicated: Secondary | ICD-10-CM | POA: Diagnosis not present

## 2022-07-29 DIAGNOSIS — F1420 Cocaine dependence, uncomplicated: Secondary | ICD-10-CM | POA: Diagnosis not present

## 2022-08-07 DIAGNOSIS — F1420 Cocaine dependence, uncomplicated: Secondary | ICD-10-CM | POA: Diagnosis not present

## 2022-08-11 DIAGNOSIS — R21 Rash and other nonspecific skin eruption: Secondary | ICD-10-CM | POA: Diagnosis not present

## 2022-08-18 DIAGNOSIS — F1420 Cocaine dependence, uncomplicated: Secondary | ICD-10-CM | POA: Diagnosis not present

## 2022-08-20 DIAGNOSIS — I6389 Other cerebral infarction: Secondary | ICD-10-CM | POA: Diagnosis not present

## 2022-08-20 DIAGNOSIS — Z720 Tobacco use: Secondary | ICD-10-CM | POA: Diagnosis not present

## 2022-08-20 DIAGNOSIS — R002 Palpitations: Secondary | ICD-10-CM | POA: Diagnosis not present

## 2022-08-20 DIAGNOSIS — F17200 Nicotine dependence, unspecified, uncomplicated: Secondary | ICD-10-CM | POA: Diagnosis not present

## 2022-08-20 DIAGNOSIS — R0789 Other chest pain: Secondary | ICD-10-CM | POA: Diagnosis not present

## 2022-08-28 DIAGNOSIS — F1420 Cocaine dependence, uncomplicated: Secondary | ICD-10-CM | POA: Diagnosis not present

## 2022-09-01 DIAGNOSIS — F1420 Cocaine dependence, uncomplicated: Secondary | ICD-10-CM | POA: Diagnosis not present

## 2022-09-02 DIAGNOSIS — F3161 Bipolar disorder, current episode mixed, mild: Secondary | ICD-10-CM | POA: Diagnosis not present

## 2022-09-04 DIAGNOSIS — F1420 Cocaine dependence, uncomplicated: Secondary | ICD-10-CM | POA: Diagnosis not present

## 2022-09-11 DIAGNOSIS — F1420 Cocaine dependence, uncomplicated: Secondary | ICD-10-CM | POA: Diagnosis not present

## 2022-09-15 DIAGNOSIS — F1420 Cocaine dependence, uncomplicated: Secondary | ICD-10-CM | POA: Diagnosis not present

## 2022-09-23 DIAGNOSIS — R569 Unspecified convulsions: Secondary | ICD-10-CM | POA: Diagnosis not present

## 2022-09-25 DIAGNOSIS — F1420 Cocaine dependence, uncomplicated: Secondary | ICD-10-CM | POA: Diagnosis not present

## 2022-09-29 DIAGNOSIS — F1420 Cocaine dependence, uncomplicated: Secondary | ICD-10-CM | POA: Diagnosis not present

## 2022-10-01 DIAGNOSIS — Z1331 Encounter for screening for depression: Secondary | ICD-10-CM | POA: Diagnosis not present

## 2022-10-01 DIAGNOSIS — Z1330 Encounter for screening examination for mental health and behavioral disorders, unspecified: Secondary | ICD-10-CM | POA: Diagnosis not present

## 2022-10-01 DIAGNOSIS — F3161 Bipolar disorder, current episode mixed, mild: Secondary | ICD-10-CM | POA: Diagnosis not present

## 2022-10-09 DIAGNOSIS — F1420 Cocaine dependence, uncomplicated: Secondary | ICD-10-CM | POA: Diagnosis not present

## 2022-12-03 ENCOUNTER — Ambulatory Visit (INDEPENDENT_AMBULATORY_CARE_PROVIDER_SITE_OTHER)
Admission: EM | Admit: 2022-12-03 | Discharge: 2022-12-04 | Disposition: A | Discharge status: Other Acute Inpatient Hospital | Payer: Medicare HMO | Source: Home / Self Care

## 2022-12-03 DIAGNOSIS — F339 Major depressive disorder, recurrent, unspecified: Secondary | ICD-10-CM | POA: Insufficient documentation

## 2022-12-03 DIAGNOSIS — F3130 Bipolar disorder, current episode depressed, mild or moderate severity, unspecified: Secondary | ICD-10-CM | POA: Insufficient documentation

## 2022-12-03 DIAGNOSIS — I69398 Other sequelae of cerebral infarction: Secondary | ICD-10-CM | POA: Diagnosis not present

## 2022-12-03 DIAGNOSIS — F1110 Opioid abuse, uncomplicated: Secondary | ICD-10-CM

## 2022-12-03 DIAGNOSIS — Z79899 Other long term (current) drug therapy: Secondary | ICD-10-CM | POA: Insufficient documentation

## 2022-12-03 DIAGNOSIS — R45851 Suicidal ideations: Secondary | ICD-10-CM | POA: Insufficient documentation

## 2022-12-03 DIAGNOSIS — R569 Unspecified convulsions: Secondary | ICD-10-CM | POA: Diagnosis present

## 2022-12-03 DIAGNOSIS — F419 Anxiety disorder, unspecified: Secondary | ICD-10-CM | POA: Diagnosis not present

## 2022-12-03 DIAGNOSIS — Z560 Unemployment, unspecified: Secondary | ICD-10-CM | POA: Insufficient documentation

## 2022-12-03 DIAGNOSIS — E785 Hyperlipidemia, unspecified: Secondary | ICD-10-CM | POA: Diagnosis not present

## 2022-12-03 DIAGNOSIS — F1994 Other psychoactive substance use, unspecified with psychoactive substance-induced mood disorder: Secondary | ICD-10-CM | POA: Diagnosis not present

## 2022-12-03 DIAGNOSIS — Z9151 Personal history of suicidal behavior: Secondary | ICD-10-CM | POA: Insufficient documentation

## 2022-12-03 DIAGNOSIS — F1420 Cocaine dependence, uncomplicated: Secondary | ICD-10-CM | POA: Insufficient documentation

## 2022-12-03 DIAGNOSIS — F1414 Cocaine abuse with cocaine-induced mood disorder: Secondary | ICD-10-CM | POA: Insufficient documentation

## 2022-12-03 DIAGNOSIS — I69354 Hemiplegia and hemiparesis following cerebral infarction affecting left non-dominant side: Secondary | ICD-10-CM | POA: Insufficient documentation

## 2022-12-03 DIAGNOSIS — F1220 Cannabis dependence, uncomplicated: Secondary | ICD-10-CM | POA: Insufficient documentation

## 2022-12-03 DIAGNOSIS — F1120 Opioid dependence, uncomplicated: Secondary | ICD-10-CM | POA: Insufficient documentation

## 2022-12-03 DIAGNOSIS — F4310 Post-traumatic stress disorder, unspecified: Secondary | ICD-10-CM | POA: Diagnosis not present

## 2022-12-03 DIAGNOSIS — G40909 Epilepsy, unspecified, not intractable, without status epilepticus: Secondary | ICD-10-CM | POA: Insufficient documentation

## 2022-12-03 DIAGNOSIS — F1410 Cocaine abuse, uncomplicated: Secondary | ICD-10-CM | POA: Diagnosis not present

## 2022-12-03 DIAGNOSIS — G629 Polyneuropathy, unspecified: Secondary | ICD-10-CM | POA: Insufficient documentation

## 2022-12-03 LAB — COMPREHENSIVE METABOLIC PANEL
ALT: 14 U/L (ref 0–44)
AST: 16 U/L (ref 15–41)
Albumin: 4.5 g/dL (ref 3.5–5.0)
Alkaline Phosphatase: 37 U/L — ABNORMAL LOW (ref 38–126)
Anion gap: 14 (ref 5–15)
BUN: 8 mg/dL (ref 6–20)
CO2: 19 mmol/L — ABNORMAL LOW (ref 22–32)
Calcium: 9.6 mg/dL (ref 8.9–10.3)
Chloride: 102 mmol/L (ref 98–111)
Creatinine, Ser: 0.46 mg/dL (ref 0.44–1.00)
GFR, Estimated: >60 mL/min (ref >60)
Glucose, Bld: 76 mg/dL (ref 70–99)
Potassium: 3.5 mmol/L (ref 3.5–5.1)
Sodium: 135 mmol/L (ref 135–145)
Total Bilirubin: 1.9 mg/dL — ABNORMAL HIGH (ref 0.3–1.2)
Total Protein: 6.4 g/dL — ABNORMAL LOW (ref 6.5–8.1)

## 2022-12-03 LAB — CBC WITH DIFFERENTIAL/PLATELET
Abs Immature Granulocytes: 0.02 10*3/uL (ref 0.00–0.07)
Basophils Absolute: 0.1 10*3/uL (ref 0.0–0.1)
Basophils Relative: 1 %
Eosinophils Absolute: 0.1 10*3/uL (ref 0.0–0.5)
Eosinophils Relative: 1 %
HCT: 39.2 % (ref 36.0–46.0)
Hemoglobin: 13.1 g/dL (ref 12.0–15.0)
Immature Granulocytes: 0 %
Lymphocytes Relative: 31 %
Lymphs Abs: 2.6 10*3/uL (ref 0.7–4.0)
MCH: 30.2 pg (ref 26.0–34.0)
MCHC: 33.4 g/dL (ref 30.0–36.0)
MCV: 90.3 fL (ref 80.0–100.0)
Monocytes Absolute: 0.7 10*3/uL (ref 0.1–1.0)
Monocytes Relative: 8 %
Neutro Abs: 4.9 10*3/uL (ref 1.7–7.7)
Neutrophils Relative %: 59 %
Platelets: 273 10*3/uL (ref 150–400)
RBC: 4.34 MIL/uL (ref 3.87–5.11)
RDW: 12.9 % (ref 11.5–15.5)
WBC: 8.4 10*3/uL (ref 4.0–10.5)
nRBC: 0 % (ref 0.0–0.2)

## 2022-12-03 LAB — ETHANOL: Alcohol, Ethyl (B): <10 mg/dL (ref <10)

## 2022-12-03 LAB — POCT URINE DRUG SCREEN - MANUAL ENTRY (I-SCREEN)
POC Amphetamine UR: NOT DETECTED
POC Buprenorphine (BUP): NOT DETECTED
POC Cocaine UR: NOT DETECTED
POC Marijuana UR: POSITIVE — AB
POC Methadone UR: NOT DETECTED
POC Methamphetamine UR: NOT DETECTED
POC Morphine: NOT DETECTED
POC Oxazepam (BZO): POSITIVE — AB
POC Oxycodone UR: NOT DETECTED
POC Secobarbital (BAR): NOT DETECTED

## 2022-12-03 LAB — TSH: TSH: 0.996 u[IU]/mL (ref 0.350–4.500)

## 2022-12-03 LAB — POC URINE PREG, ED: Preg Test, Ur: NEGATIVE

## 2022-12-03 MED ORDER — HYDROXYZINE HCL 25 MG PO TABS
50.0000 mg | ORAL_TABLET | Freq: Four times a day (QID) | ORAL | Status: DC | PRN
  Administered 2022-12-03: 50 mg via ORAL
  Filled 2022-12-03: qty 2

## 2022-12-03 MED ORDER — OLANZAPINE 10 MG PO TBDP: 10.0000 mg | ORAL_TABLET | Freq: Three times a day (TID) | ORAL | Status: DC | PRN

## 2022-12-03 MED ORDER — LOPERAMIDE HCL 2 MG PO CAPS: 2.0000 mg | ORAL_CAPSULE | ORAL | Status: DC | PRN

## 2022-12-03 MED ORDER — ALUM & MAG HYDROXIDE-SIMETH 200-200-20 MG/5ML PO SUSP: 30.0000 mL | ORAL | Status: DC | PRN

## 2022-12-03 MED ORDER — ONDANSETRON 4 MG PO TBDP: 4.0000 mg | ORAL_TABLET | Freq: Four times a day (QID) | ORAL | Status: DC | PRN

## 2022-12-03 MED ORDER — ZIPRASIDONE MESYLATE 20 MG IM SOLR: 20.0000 mg | INTRAMUSCULAR | Status: DC | PRN

## 2022-12-03 MED ORDER — NAPROXEN 500 MG PO TABS: 500.0000 mg | ORAL_TABLET | Freq: Two times a day (BID) | ORAL | Status: DC | PRN

## 2022-12-03 MED ORDER — HYDROXYZINE HCL 25 MG PO TABS: 25.0000 mg | ORAL_TABLET | Freq: Four times a day (QID) | ORAL | Status: DC | PRN

## 2022-12-03 MED ORDER — METHOCARBAMOL 500 MG PO TABS: 500.0000 mg | ORAL_TABLET | Freq: Three times a day (TID) | ORAL | Status: DC | PRN

## 2022-12-03 MED ORDER — ACETAMINOPHEN 325 MG PO TABS
650.0000 mg | ORAL_TABLET | Freq: Four times a day (QID) | ORAL | Status: DC | PRN
  Administered 2022-12-03: 650 mg via ORAL
  Filled 2022-12-03: qty 2

## 2022-12-03 MED ORDER — DICYCLOMINE HCL 20 MG PO TABS: 20.0000 mg | ORAL_TABLET | Freq: Four times a day (QID) | ORAL | Status: DC | PRN

## 2022-12-03 MED ORDER — MAGNESIUM HYDROXIDE 400 MG/5ML PO SUSP: 30.0000 mL | Freq: Every day | ORAL | Status: DC | PRN
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
## 2022-12-03 NOTE — ED Notes (Signed)
Called lab at [facility] to see if they could add on Lamotrigine level per provider order. They say they cannot see that order in the system. Will check back

## 2022-12-03 NOTE — ED Provider Notes (Signed)
[facility] Continuous Assessment Admission H&P  Date: 12/04/22 Patient Name: Jane Watkins MRN: [id number] Chief Complaint: suicidal with plan of OD  Diagnoses:  Final diagnoses:  Suicidal ideation  Opioid abuse (HCC)  Fentanyl dependence (HCC)  Cocaine abuse (HCC)    HPI: Jane Watkins "lydia",  27 y/o female with a history of Fentanyl abuse, suicidal ideation, bipolar disorder, anxiety, seizure disorder, polysubstance abuse.  Presented to [facility] accompanied by her boyfriend.  Per the patient she is suicidal with plans to OD on fentanyl.  According to patient she has been using fentanyl for a while, patient last use illicit drugs prior to coming in today.  Per the patient she also takes Lamictal and other medications which is prescribed by her psychiatrist that she sees at [facility].  Patient also see a therapist virtually at [facility].  Patient currently lives with her boyfriend, currently unemployed.  Face-to-face observation of patient, patient is alert and oriented x 4, speech is clear however patient does have a pause in her speech when talking.  Patient endorsed suicidal ideation that she had plans to overdose on fentanyl.  Patient denies HI, AVH or paranoia.  Patient denies alcohol use.  According to patient she does see a therapist once a week via virtual visit.  Patient does not seem to be influenced by external or internal stimuli.  According to patient she has tried outpatient detox program but they do not work.  Review of records show that patient was last inpatient here a year ago.  Because of patient current and active suicide ideation recommend inpatient observation with reassessment for [facility] when patient is more stable.  Total Time spent with patient: 30 minutes  Musculoskeletal  Strength & Muscle Tone: within normal limits Gait & Station: normal Patient leans: N/A  Psychiatric Specialty Exam  Presentation General Appearance:  Casual  Eye  Contact: Good  Speech: Clear and Coherent  Speech Volume: Normal  Handedness: Right   Mood and Affect  Mood: Anxious  Affect: Appropriate   Thought Process  Thought Processes: Coherent  Descriptions of Associations:Intact  Orientation:Full (Time, Place and Person)  Thought Content:WDL  Diagnosis of Schizophrenia or Schizoaffective disorder in past: No   Hallucinations:Hallucinations: None  Ideas of Reference:None  Suicidal Thoughts:Suicidal Thoughts: Yes, Passive SI Active Intent and/or Plan: With Intent; With Plan SI Passive Intent and/or Plan: With Intent; With Plan  Homicidal Thoughts:Homicidal Thoughts: No   Sensorium  Memory: Immediate Fair  Judgment: Poor  Insight: Fair   Chartered certified accountant: Good  Attention Span: Good  Recall: Good  Fund of Knowledge: Good  Language: Good   Psychomotor Activity  Psychomotor Activity: Psychomotor Activity: Normal   Assets  Assets: Desire for Improvement   Sleep  Sleep: Sleep: Fair Number of Hours of Sleep: 5   Nutritional Assessment (For OBS and FBC admissions only) Has the patient had a weight loss or gain of 10 pounds or more in the last 3 months?: No Has the patient had a decrease in food intake/or appetite?: No Does the patient have dental problems?: No Does the patient have eating habits or behaviors that may be indicators of an eating disorder including binging or inducing vomiting?: No Has the patient recently lost weight without trying?: 0 Has the patient been eating poorly because of a decreased appetite?: 0 Malnutrition Screening Tool Score: 0    Physical Exam HENT:     Head: Normocephalic.     Nose: Nose normal.  Eyes:  Pupils: Pupils are equal, round, and reactive to light.  Cardiovascular:     Rate and Rhythm: Normal rate.  Pulmonary:     Effort: Pulmonary effort is normal.  Musculoskeletal:        General: Normal range of motion.      Cervical back: Normal range of motion.  Skin:    General: Skin is warm.  Neurological:     General: No focal deficit present.     Mental Status: She is alert.  Psychiatric:        Mood and Affect: Mood normal.        Behavior: Behavior normal.        Thought Content: Thought content normal.        Judgment: Judgment normal.    Review of Systems  Constitutional: Negative.   HENT: Negative.    Eyes: Negative.   Respiratory: Negative.    Cardiovascular: Negative.   Gastrointestinal: Negative.   Genitourinary: Negative.   Musculoskeletal: Negative.   Skin: Negative.   Neurological: Negative.   Psychiatric/Behavioral:  Positive for substance abuse and suicidal ideas. The patient is nervous/anxious.     Blood pressure (!) 100/54, pulse 97, temperature 98.5 F (36.9 C), resp. rate (!) 22, SpO2 97%. There is no height or weight on file to calculate BMI.  Past Psychiatric History: opioid abuse,  Bipolar dx,  GAD   Is the patient at risk to self? Yes  Has the patient been a risk to self in the past 6 months? Yes .    Has the patient been a risk to self within the distant past? Yes   Is the patient a risk to others? No   Has the patient been a risk to others in the past 6 months? No   Has the patient been a risk to others within the distant past? No   Past Medical History: see chart   Family History: unknown   Social History: fentanyl abuse, cocaine abuse  Last Labs:  Admission on 12/03/2022, Discharged on 12/04/2022  Component Date Value Ref Range Status   WBC 12/03/2022 8.4  4.0 - 10.5 K/uL Final   RBC 12/03/2022 4.34  3.87 - 5.11 MIL/uL Final   Hemoglobin 12/03/2022 13.1  12.0 - 15.0 g/dL Final   HCT 46/96/2952 39.2  36.0 - 46.0 % Final   MCV 12/03/2022 90.3  80.0 - 100.0 fL Final   MCH 12/03/2022 30.2  26.0 - 34.0 pg Final   MCHC 12/03/2022 33.4  30.0 - 36.0 g/dL Final   RDW 84/13/2440 12.9  11.5 - 15.5 % Final   Platelets 12/03/2022 273  150 - 400 K/uL Final   nRBC  12/03/2022 0.0  0.0 - 0.2 % Final   Neutrophils Relative % 12/03/2022 59  % Final   Neutro Abs 12/03/2022 4.9  1.7 - 7.7 K/uL Final   Lymphocytes Relative 12/03/2022 31  % Final   Lymphs Abs 12/03/2022 2.6  0.7 - 4.0 K/uL Final   Monocytes Relative 12/03/2022 8  % Final   Monocytes Absolute 12/03/2022 0.7  0.1 - 1.0 K/uL Final   Eosinophils Relative 12/03/2022 1  % Final   Eosinophils Absolute 12/03/2022 0.1  0.0 - 0.5 K/uL Final   Basophils Relative 12/03/2022 1  % Final   Basophils Absolute 12/03/2022 0.1  0.0 - 0.1 K/uL Final   Immature Granulocytes 12/03/2022 0  % Final   Abs Immature Granulocytes 12/03/2022 0.02  0.00 - 0.07 K/uL Final   Performed at [facility], [address] [place], [place] [address]   Sodium 12/03/2022 135  135 - 145 mmol/L Final   Potassium 12/03/2022 3.5  3.5 - 5.1 mmol/L Final   Chloride 12/03/2022 102  98 - 111 mmol/L Final   CO2 12/03/2022 19 (L)  22 - 32 mmol/L Final   Glucose, Bld 12/03/2022 76  70 - 99 mg/dL Final   Glucose reference range applies only to samples taken after fasting for at least 8 hours.   BUN 12/03/2022 8  6 - 20 mg/dL Final   Creatinine, Ser 12/03/2022 0.46  0.44 - 1.00 mg/dL Final   Calcium 60/45/4098 9.6  8.9 - 10.3 mg/dL Final   Total Protein 11/91/4782 6.4 (L)  6.5 - 8.1 g/dL Final   Albumin 95/62/1308 4.5  3.5 - 5.0 g/dL Final   AST 65/78/4696 16  15 - 41 U/L Final   ALT 12/03/2022 14  0 - 44 U/L Final   Alkaline Phosphatase 12/03/2022 37 (L)  38 - 126 U/L Final   Total Bilirubin 12/03/2022 1.9 (H)  0.3 - 1.2 mg/dL Final   GFR, Estimated 12/03/2022 >60  >60 mL/min Final   Comment: (NOTE) Calculated using the CKD-EPI Creatinine Equation (2021)    Anion gap 12/03/2022 14  5 - 15 Final   Performed at [facility], [address] [place], [place] [address]   Alcohol, Ethyl (B) 12/03/2022 <10  <10 mg/dL Final   Comment: (NOTE) Lowest detectable limit for serum alcohol is 10 mg/dL.  For medical purposes  only. Performed at [facility], [address], [place], [place] [address]    TSH 12/03/2022 0.996  0.350 - 4.500 uIU/mL Final   Comment: Performed by a 3rd Generation assay with a functional sensitivity of <=0.01 uIU/mL. Performed at [facility], [address], [place], [place] [address]    Preg Test, Ur 12/03/2022 Negative  Negative Final   POC Amphetamine UR 12/03/2022 None Detected  NONE DETECTED (Cut Off Level 1000 ng/mL) Final   POC Secobarbital (BAR) 12/03/2022 None Detected  NONE DETECTED (Cut Off Level 300 ng/mL) Final   POC Buprenorphine (BUP) 12/03/2022 None Detected  NONE DETECTED (Cut Off Level 10 ng/mL) Final   POC Oxazepam (BZO) 12/03/2022 Positive (A)  NONE DETECTED (Cut Off Level 300 ng/mL) Final   POC Cocaine UR 12/03/2022 None Detected  NONE DETECTED (Cut Off Level 300 ng/mL) Final   POC Methamphetamine UR 12/03/2022 None Detected  NONE DETECTED (Cut Off Level 1000 ng/mL) Final   POC Morphine 12/03/2022 None Detected  NONE DETECTED (Cut Off Level 300 ng/mL) Final   POC Methadone UR 12/03/2022 None Detected  NONE DETECTED (Cut Off Level 300 ng/mL) Final   POC Oxycodone UR 12/03/2022 None Detected  NONE DETECTED (Cut Off Level 100 ng/mL) Final   POC Marijuana UR 12/03/2022 Positive (A)  NONE DETECTED (Cut Off Level 50 ng/mL) Final    Allergies: Pristiq [desvenlafaxine succinate er], Desvenlafaxine, Other, and Sulfa antibiotics  Medications:  PTA Medications  Medication Sig   naloxone (NARCAN) nasal spray 4 mg/0.1 mL ADMINISTER A SINGLE SPRAY IN ONE NOSTRIL UPON SIGNS OF OPIOID OVERDOSE. REPEAT EVERY 2 - 3 MINUTES IF NO RESPONSE IN ALTERNATING NOSTRIL TIL EMS ARRIVES.   SUMAtriptan (IMITREX) 50 MG tablet Take 1 tablet by mouth every 2 hours as needed for migraine. May repeat in 2 hours if headache persists or recurs.   busPIRone (BUSPAR) 10 MG tablet Take 1 tablet (10 mg  total) by mouth 3 (three) times daily.   hydrOXYzine (ATARAX) 50 MG tablet Take 1  tablet (50 mg total) by mouth every 6 (six) hours as needed for anxiety.   nicotine (NICODERM CQ - DOSED IN MG/24 HOURS) 14 mg/24hr patch Place 1 patch (14 mg total) onto the skin daily.   QUEtiapine (SEROQUEL) 50 MG tablet Take 1 tablet (50 mg total) by mouth at bedtime.   docusate sodium (COLACE) 100 MG capsule Take 1 capsule (100 mg total) by mouth 2 (two) times daily.   baclofen (LIORESAL) 10 MG tablet Take 1 tablet (10 mg total) by mouth 3 (three) times daily.   gabapentin (NEURONTIN) 300 MG capsule Take 1 capsule (300 mg total) by mouth at bedtime.   lamoTRIgine (LAMICTAL) 25 MG tablet Take 1 tablet (25 mg total) by mouth 2 (two) times daily.   OXcarbazepine (TRILEPTAL) 150 MG tablet Take 1 tablet (150 mg total) by mouth 2 (two) times daily.   acyclovir ointment (ZOVIRAX) 5 % Apply topically every 3 (three) hours.    Screenings    Flowsheet Row Most Recent Value  COWS Total Score 1       Medical Decision Making  Inpatient observation     Recommendations  Based on my evaluation the patient does not appear to have an emergency medical condition.  Sindy Guadeloupe, NP 12/04/22  5:33 AM

## 2022-12-03 NOTE — ED Notes (Addendum)
Pt states that she is here voluntarily d/t a relapse on illicit substances d/t family, financial and physical stressors. Pt had a stroke in 2020 that resulted in left-sided deficits. Pt has financial stress. Her boyfriend just started working again. Denies having issues with picking up or paying for her medication. Pt denies HI, AVH. Endorses SI with plan to overdose on fentanyl. Contracts for safety. Anxiety 5/10 and depression 8/10. Wants inpatient treatment. Pt has not been taking many of her psychiatric meds. Takes Lamictal 100 mg for her seizure disorder. Pt reports last seizure was 4 days ago. It woke her from her sleep. Takes hydroxyzine 50 for anxiety and sleep, propranolol 10 mg for anxiety. Lioresal 10 mg for pain. Pt last took Lamictal yesterday. Denies alcohol use. Tobacco use 1/2 ppd. Pt is cooperative with assessment. Given food and drink. Resting in Bed 2.

## 2022-12-03 NOTE — ED Notes (Signed)
Pt resting at this hour. No apparent distress. RR even and unlabored. Monitored for safety.  

## 2022-12-03 NOTE — Progress Notes (Signed)
   12/03/22 1959  [facility] Triage Screening (Walk-ins at [facility] only)  How Did You Hear About [place]? Family/Friend (significant other brought her to [facility])  What Is the Reason for Your Visit/Call Today? Pt came to [facility] with boyfriend because she said she is an addict and has been having SI.  Pt has plan to overdose to kill herself.  She has had a previous suicide attempt by overdosing on 01/03/22 and went to [facility].  She had attempted to overdose on phentanyl and cocaine.  She had two previous attempts in addition to last year, which were unreported.  Pt denies any HI  Patient denies any A/V hallucinations.  She does use cocaine and marijuana regularly.  Has used phentanyl once since last November and that was a week ago.  Patient has last used crack and marijuana today.  Pt denies access to a weapon or gun.  Pt goes to a IOP [facility] in [facility]  Pt sees a psychiatrist with [facility].  She has a neurologist through [facility].  Pt had a stroke in 2020 which affects her left side.  She is independent with ambulation.  Pt has a seizure d/o for which she takes lamictal  Pt feel slike she might benefit from inpatient care.  How Long Has This Been Causing You Problems? > than 6 months  Have You Recently Had Any Thoughts About Hurting Yourself? Yes  How long ago did you have thoughts about hurting yourself? Today  Are You Planning to Commit Suicide/Harm Yourself At This time? Yes  Have you Recently Had Thoughts About Hurting Someone Karolee Ohs? No  Are You Planning To Harm Someone At This Time? No  Are you currently experiencing any auditory, visual or other hallucinations? No  Have You Used Any Alcohol or Drugs in the Past 24 Hours? Yes  How long ago did you use Drugs or Alcohol? Today (12/03/22)  What Did You Use and How Much? Crack cocaine smoked about 1/2 gram; Smoked a blunt today also.

## 2022-12-04 ENCOUNTER — Other Ambulatory Visit: Payer: Self-pay

## 2022-12-04 ENCOUNTER — Emergency Department (HOSPITAL_COMMUNITY)
Admission: EM | Admit: 2022-12-04 | Discharge: 2022-12-04 | Disposition: A | Discharge status: Home / Self Care | Payer: Medicare HMO | Attending: Emergency Medicine | Admitting: Emergency Medicine

## 2022-12-04 ENCOUNTER — Other Ambulatory Visit (INDEPENDENT_AMBULATORY_CARE_PROVIDER_SITE_OTHER)
Admission: EM | Admit: 2022-12-04 | Discharge: 2022-12-06 | Disposition: A | Discharge status: Home / Self Care | Payer: Medicare HMO | Source: Home / Self Care | Admitting: Psychiatry

## 2022-12-04 ENCOUNTER — Ambulatory Visit (INDEPENDENT_AMBULATORY_CARE_PROVIDER_SITE_OTHER)
Admission: EM | Admit: 2022-12-04 | Discharge: 2022-12-04 | Disposition: A | Discharge status: Home / Self Care | Payer: Medicare HMO | Source: Home / Self Care

## 2022-12-04 DIAGNOSIS — R569 Unspecified convulsions: Secondary | ICD-10-CM

## 2022-12-04 DIAGNOSIS — F4310 Post-traumatic stress disorder, unspecified: Secondary | ICD-10-CM | POA: Insufficient documentation

## 2022-12-04 DIAGNOSIS — F319 Bipolar disorder, unspecified: Secondary | ICD-10-CM | POA: Insufficient documentation

## 2022-12-04 DIAGNOSIS — F1414 Cocaine abuse with cocaine-induced mood disorder: Secondary | ICD-10-CM | POA: Insufficient documentation

## 2022-12-04 DIAGNOSIS — I69354 Hemiplegia and hemiparesis following cerebral infarction affecting left non-dominant side: Secondary | ICD-10-CM | POA: Insufficient documentation

## 2022-12-04 DIAGNOSIS — R45851 Suicidal ideations: Secondary | ICD-10-CM | POA: Insufficient documentation

## 2022-12-04 DIAGNOSIS — I69398 Other sequelae of cerebral infarction: Secondary | ICD-10-CM | POA: Insufficient documentation

## 2022-12-04 DIAGNOSIS — F1210 Cannabis abuse, uncomplicated: Secondary | ICD-10-CM | POA: Insufficient documentation

## 2022-12-04 DIAGNOSIS — F339 Major depressive disorder, recurrent, unspecified: Secondary | ICD-10-CM | POA: Insufficient documentation

## 2022-12-04 DIAGNOSIS — G40909 Epilepsy, unspecified, not intractable, without status epilepticus: Secondary | ICD-10-CM | POA: Insufficient documentation

## 2022-12-04 DIAGNOSIS — E785 Hyperlipidemia, unspecified: Secondary | ICD-10-CM | POA: Insufficient documentation

## 2022-12-04 DIAGNOSIS — F1994 Other psychoactive substance use, unspecified with psychoactive substance-induced mood disorder: Secondary | ICD-10-CM | POA: Insufficient documentation

## 2022-12-04 DIAGNOSIS — F1920 Other psychoactive substance dependence, uncomplicated: Secondary | ICD-10-CM | POA: Diagnosis present

## 2022-12-04 DIAGNOSIS — Z79899 Other long term (current) drug therapy: Secondary | ICD-10-CM | POA: Insufficient documentation

## 2022-12-04 DIAGNOSIS — F1910 Other psychoactive substance abuse, uncomplicated: Secondary | ICD-10-CM

## 2022-12-04 DIAGNOSIS — G629 Polyneuropathy, unspecified: Secondary | ICD-10-CM | POA: Insufficient documentation

## 2022-12-04 DIAGNOSIS — F419 Anxiety disorder, unspecified: Secondary | ICD-10-CM | POA: Insufficient documentation

## 2022-12-04 DIAGNOSIS — F1410 Cocaine abuse, uncomplicated: Secondary | ICD-10-CM

## 2022-12-04 MED ORDER — ACETAMINOPHEN 325 MG PO TABS: 650.0000 mg | ORAL_TABLET | Freq: Four times a day (QID) | ORAL | Status: DC | PRN

## 2022-12-04 MED ORDER — ONDANSETRON 4 MG PO TBDP: 4.0000 mg | ORAL_TABLET | Freq: Four times a day (QID) | ORAL | Status: DC | PRN

## 2022-12-04 MED ORDER — MAGNESIUM HYDROXIDE 400 MG/5ML PO SUSP
30.0000 mL | Freq: Every day | ORAL | Status: DC | PRN
Start: 2022-12-04 — End: 2022-12-06

## 2022-12-04 MED ORDER — LAMOTRIGINE 25 MG PO TABS
100.0000 mg | ORAL_TABLET | Freq: Once | ORAL | Status: AC
  Administered 2022-12-04: 100 mg via ORAL
  Filled 2022-12-04: qty 4

## 2022-12-04 MED ORDER — LAMOTRIGINE 100 MG PO TABS: 100.0000 mg | ORAL_TABLET | Freq: Two times a day (BID) | ORAL | Status: DC

## 2022-12-04 MED ORDER — LOPERAMIDE HCL 2 MG PO CAPS: 2.0000 mg | ORAL_CAPSULE | ORAL | Status: DC | PRN

## 2022-12-04 MED ORDER — BACLOFEN 10 MG PO TABS
10.0000 mg | ORAL_TABLET | Freq: Three times a day (TID) | ORAL | Status: DC
  Administered 2022-12-04 – 2022-12-06 (×6): 10 mg via ORAL
  Filled 2022-12-04 (×6): qty 1

## 2022-12-04 MED ORDER — LAMOTRIGINE 100 MG PO TABS
100.0000 mg | ORAL_TABLET | Freq: Two times a day (BID) | ORAL | Status: DC
  Administered 2022-12-05 – 2022-12-06 (×3): 100 mg via ORAL
  Filled 2022-12-04 (×3): qty 1

## 2022-12-04 MED ORDER — PROPRANOLOL HCL 10 MG PO TABS
10.0000 mg | ORAL_TABLET | Freq: Every day | ORAL | Status: DC
  Administered 2022-12-04: 10 mg via ORAL
  Filled 2022-12-04: qty 1

## 2022-12-04 MED ORDER — LAMOTRIGINE 100 MG PO TABS
100.0000 mg | ORAL_TABLET | Freq: Every day | ORAL | Status: DC
  Administered 2022-12-04: 100 mg via ORAL
  Filled 2022-12-04: qty 1

## 2022-12-04 MED ORDER — LORAZEPAM 1 MG PO TABS: 1.0000 mg | ORAL_TABLET | ORAL | Status: DC | PRN

## 2022-12-04 MED ORDER — OLANZAPINE 10 MG PO TBDP: 10.0000 mg | ORAL_TABLET | Freq: Three times a day (TID) | ORAL | Status: DC | PRN

## 2022-12-04 MED ORDER — MAGNESIUM HYDROXIDE 400 MG/5ML PO SUSP: 30.0000 mL | Freq: Every day | ORAL | Status: DC | PRN

## 2022-12-04 MED ORDER — ALUM & MAG HYDROXIDE-SIMETH 200-200-20 MG/5ML PO SUSP: 30.0000 mL | ORAL | Status: DC | PRN

## 2022-12-04 MED ORDER — LAMOTRIGINE 100 MG PO TABS
100.0000 mg | ORAL_TABLET | Freq: Once | ORAL | Status: AC
  Administered 2022-12-04: 100 mg via ORAL
  Filled 2022-12-04: qty 1

## 2022-12-04 MED ORDER — HYDROXYZINE HCL 25 MG PO TABS: 50.0000 mg | ORAL_TABLET | Freq: Three times a day (TID) | ORAL | Status: DC | PRN

## 2022-12-04 MED ORDER — NICOTINE 14 MG/24HR TD PT24
14.0000 mg | MEDICATED_PATCH | Freq: Every day | TRANSDERMAL | Status: DC
  Administered 2022-12-04: 14 mg via TRANSDERMAL
  Filled 2022-12-04: qty 1

## 2022-12-04 MED ORDER — ZIPRASIDONE MESYLATE 20 MG IM SOLR: 20.0000 mg | INTRAMUSCULAR | Status: DC | PRN

## 2022-12-04 MED ORDER — LORAZEPAM 1 MG PO TABS: 1.0000 mg | ORAL_TABLET | Freq: Four times a day (QID) | ORAL | Status: DC | PRN

## 2022-12-04 NOTE — Progress Notes (Signed)
Pt stayed in her room but woke up for dinner. No distress noted or concerns voiced. Staff will monitor for pt's safety.

## 2022-12-04 NOTE — ED Provider Notes (Signed)
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   Patient reports that she has been frequently using crack cocaine as well as fentanyl and her boyfriend brought her here out of concern for her health. She says she uses fentanyl about twice a year, and her last use was the day before yesterday. She was last clean from January to March of this year and then relapsed. She has been using cocaine every day since March 2024. She also uses marijuana on and off. She reports that she wants to get high and end her life. She used to use Fentanyl more frequently back in 2018 and tried suboxone at that time, but feels like she doesn't need it now. She states that she does not want to be dependent on an opioid. She has been struggling with polysubstance use since she as 28 years old, and she believes this is because she "wants an escape." She also reports that she will be tempted by her friends to use again. She is currently in a relationship with her boyfriend who doesn't use, and would like to figure out some way for her to stop feeling uncomfortable in her own skin in the same way that drugs do. She spoke with her virtual therapist yesterday and they both  agreed she would be safest here at [facility].   She reports that her stepmother passed away and her father remarried quickly, and it feels as though he has a new life. She does not feel he is as close with her anymore, but he is still supportive. She reports her relationship with her new stepmother is "nonexistent."  She is not interested in residential treatment at this time.   Psychiatric ROS Mood Symptoms Depression: dysphoria, passive SI, anhedonia, guilt, trouble sleeping, mainly sleeps with Z-quil, hopelessness  Manic Symptoms Has in the past, unclear if substance induced or not  Anxiety Symptoms Denies excessive worry  Trauma Symptoms Exposure to actual or threatened death, serious injury, or sexual violence; intrusive symptoms (e.g., flashbacks, distressing memories, or dreams); avoidance of stimuli associated with the trauma; negative alterations in cognitions and mood (e.g., inability to remember aspects of the trauma, persistent negative beliefs, or emotional numbing); marked alterations in arousal and reactivity (e.g., hypervigilance, exaggerated startle response, irritability, or sleep disturbance)  Emotional, verbal, physical, sexual abuse  Psychosis Symptoms Denies AVH   Substance Use Hx: Alcohol: denies Tobacco: denies Cannabis:on and off Cocaine: daily since March 2024 Methamphetamines: denies Psilocybin (mushrooms): every couple weeks Ecstasy (MDMA / molly): denies LSD (acid): never tried: denies Opiates (fentanyl / heroin): fentanyl twice a year Benzos (Xanax, Klonopin): xanax occasionally Prescribed meds abuse: denies IV Drug Use Hx: denies, yes in the past last use 2020 Rehab hx: been through multiple rehabs but relapses quickly, most recent [facility] center in December last year  Past Psychiatric Hx: Current Psychiatrist:  at [facility], seen by Dr. Jola Babinski in 2020 Current Therapist: enrolled in virtual IOP (therapist Velda Shell) and group therapy at [facility] Previous Psychiatric Diagnoses: substance use, opiate abuse, MDD, bipolar disorder Current psychiatric medications: hydroxyzine 50 mg Q6H prn, buspirone 10 mg TID, lamictal 100 mg BID, trileptal 150 mg BID, seroquel 50 mg nightly Psychiatric medication history/compliance: trials of pristiq, zoloft, fluoxetine  Psychiatric Hospitalization hx: Has 2 prior psych hospitalizations, most recently discharged from [facility] (12/2021)  Psychotherapy hx: enrolled in virtual IOP and group therapy at [facility] Neuromodulation history: denies History of suicide: denies prior suicide attempts History of homicide or aggression: denies   Past Medical History: PCP: with [facility] in the past, looking to switch Medical Dx:TBI (2018), HIE (2018), CVA (2020), RMCA (2021), dyslipidemia, spastic hemiparesis, cardiac arrest (2023) Medications: baclofen 10 mg TID, atorvastatin 20 mg daily, gabapentin 300 mg nightly, protonix 40 mg daily, propranolol 10 mg BID Allergies: desvenlafaxine (rash), sulfa drugs (rash) Seizures: history of seizure disorder, on lamictal  POCT urine pregnancy test negative Contraceptives: IUD  Family Medical History: Anemia - father Lung Cancer - maternal grandmother, paternal grandfather Thyroid Cancer - maternal grandmother  Family Psychiatric History: None  Social History: Living Situation: lives with her boyfriend of 2 years in [place] Social Support: father, mother, boyfriend Education: graduated Careers information officer, 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 Occupational hx: on disability Marital Status: dating partner of 2 years Children: none Legal: finished probation October 1st 2024 for cocaine charges Military: denies  Access to firearms: denies   PHQ 2-9:  Flowsheet Row ED from 12/04/2022 in [facility] Office Visit from 05/24/2019 [facility]  Thoughts that you would be better off dead, or of hurting yourself in some  way Nearly every day Nearly every day  PHQ-9 Total Score 24 21       Flowsheet Row ED from 12/04/2022 in [facility] Most recent reading at 12/04/2022  5:45 AM ED from 12/04/2022 in [facility] Emergency Department at [facility] Most recent reading at 12/04/2022  1:23 AM ED from 12/03/2022 in [facility] Most recent reading at 12/03/2022 10:52 PM  C-SSRS RISK CATEGORY High Risk No Risk High Risk         Total Time spent with patient: 30 minutes  Musculoskeletal  Strength & Muscle Tone: patient had a CVA and has tonic constriction of L arm Gait & Station: impaired baseline gait due to CVA history and spastic L side hemiparesis Patient leans: left  Psychiatric Specialty Exam  Presentation General Appearance:  Disheveled  Eye Contact: Good  Speech: Clear and Coherent; Slow  Speech Volume: Normal  Handedness: Right   Mood and Affect  Mood: Depressed  Affect: Appropriate   Thought Process  Thought Processes: Coherent  Descriptions of Associations:Intact  Orientation:Full (Time, Place and Person)  Thought Content:WDL  Diagnosis of Schizophrenia or Schizoaffective disorder in past: No   Hallucinations:Hallucinations: None  Ideas of Reference:None  Suicidal Thoughts:Suicidal Thoughts: yes SI Passive Intent without plan "I just want to get high and go off and die"  Homicidal Thoughts:Homicidal Thoughts: No   Sensorium  Memory: Immediate Good; Recent Good  Judgment: Poor  Insight: Poor   Executive Functions  Concentration: Good  Attention Span: Good  Recall: Good  Fund of Knowledge: Good  Language: Good   Psychomotor Activity  Psychomotor Activity: Psychomotor Activity: post CVA limitations with L side spastic hemiparesis at baseline  Assets  Assets: Manufacturing systems engineer; Social Support; Housing   Sleep  Sleep: Sleep: Fair Number of Hours of Sleep:  5   Nutritional Assessment (For OBS and FBC admissions only) Has the patient had a weight loss or gain of 10 pounds or more in the last 3 months?: No Has the patient had a decrease in food intake/or appetite?: No Does the patient have dental problems?: No Does the patient have eating habits or behaviors that may be indicators of an eating disorder including binging or inducing vomiting?: No Has the patient recently lost weight without trying?: 0 Has the patient been eating poorly because of a decreased appetite?: 0 Malnutrition Screening Tool Score: 0    Physical Exam ROS  Blood pressure 105/66, pulse 70, temperature 98.5 F (36.9 C), temperature source Oral, resp. rate 18, SpO2 98%. There is no height or weight on file to calculate BMI.    Is the patient at risk to self? Yes  Has the patient been a risk to self in the past 6 months? Yes .    Has the patient been a risk to self within the distant past? Yes   Is the patient a risk to others? No   Has the patient been a risk to others in the past 6 months? No   Has the patient been a risk to others within the distant past? No     Last Labs:  Admission on 12/03/2022, Discharged on 12/04/2022  Component Date Value Ref Range Status   WBC 12/03/2022 8.4  4.0 - 10.5 K/uL Final   RBC 12/03/2022 4.34  3.87 - 5.11 MIL/uL Final   Hemoglobin 12/03/2022 13.1  12.0 - 15.0 g/dL Final   HCT 28/41/3244 39.2  36.0 - 46.0 % Final   MCV 12/03/2022 90.3  80.0 - 100.0 fL Final   MCH 12/03/2022 30.2  26.0 - 34.0 pg Final   MCHC 12/03/2022 33.4  30.0 - 36.0 g/dL Final   RDW 02/12/7251 12.9  11.5 - 15.5 % Final   Platelets 12/03/2022 273  150 - 400 K/uL Final   nRBC 12/03/2022 0.0  0.0 - 0.2 % Final   Neutrophils Relative % 12/03/2022 59  % Final   Neutro Abs 12/03/2022 4.9  1.7 - 7.7 K/uL Final   Lymphocytes Relative 12/03/2022 31  % Final   Lymphs Abs 12/03/2022 2.6  0.7 - 4.0 K/uL Final   Monocytes Relative 12/03/2022 8  % Final    Monocytes Absolute 12/03/2022 0.7  0.1 - 1.0 K/uL Final   Eosinophils Relative 12/03/2022 1  % Final   Eosinophils Absolute 12/03/2022 0.1  0.0 - 0.5 K/uL Final   Basophils Relative 12/03/2022 1  % Final   Basophils Absolute 12/03/2022 0.1  0.0 - 0.1 K/uL Final   Immature Granulocytes 12/03/2022 0  % Final   Abs Immature Granulocytes 12/03/2022 0.02  0.00 - 0.07 K/uL Final   Performed at [facility], [address], [place], [place] [address]   Sodium 12/03/2022 135  135 - 145 mmol/L Final   Potassium 12/03/2022 3.5  3.5 - 5.1 mmol/L Final   Chloride 12/03/2022 102  98 - 111 mmol/L Final   CO2 12/03/2022 19 (L)  22 - 32 mmol/L Final   Glucose, Bld 12/03/2022 76  70 - 99 mg/dL Final   Glucose reference range applies only to samples taken after fasting for at least 8 hours.   BUN 12/03/2022 8  6 - 20 mg/dL Final   Creatinine,  Ser 12/03/2022 0.46  0.44 - 1.00 mg/dL Final   Calcium 62/13/0865 9.6  8.9 - 10.3 mg/dL Final   Total Protein 78/46/9629 6.4 (L)  6.5 - 8.1 g/dL Final   Albumin 52/84/1324 4.5  3.5 - 5.0 g/dL Final   AST 40/11/2723 16  15 - 41 U/L Final   ALT 12/03/2022 14  0 - 44 U/L Final   Alkaline Phosphatase 12/03/2022 37 (L)  38 - 126 U/L Final   Total Bilirubin 12/03/2022 1.9 (H)  0.3 - 1.2 mg/dL Final   GFR, Estimated 12/03/2022 >60  >60 mL/min Final   Comment: (NOTE) Calculated using the CKD-EPI Creatinine Equation (2021)    Anion gap 12/03/2022 14  5 - 15 Final   Performed at [facility], [address], [place], [place] [address]   Alcohol, Ethyl (B) 12/03/2022 <10  <10 mg/dL Final   Comment: (NOTE) Lowest detectable limit for serum alcohol is 10 mg/dL.  For medical purposes only. Performed at [facility], [address] [place], [place] [address]    TSH 12/03/2022 0.996  0.350 - 4.500 uIU/mL Final   Comment: Performed by a 3rd Generation assay with a functional sensitivity of <=0.01 uIU/mL. Performed at [facility], [address], [place], [place] [address]    Preg Test, Ur 12/03/2022 Negative  Negative Final   POC Amphetamine UR 12/03/2022 None Detected  NONE DETECTED (Cut Off Level 1000 ng/mL) Final   POC Secobarbital (BAR) 12/03/2022 None Detected  NONE DETECTED (Cut Off Level 300 ng/mL) Final   POC Buprenorphine (BUP) 12/03/2022 None Detected  NONE DETECTED (Cut Off Level 10 ng/mL) Final   POC Oxazepam (BZO) 12/03/2022 Positive (A)  NONE DETECTED (Cut Off Level 300 ng/mL) Final   POC Cocaine UR 12/03/2022 None Detected  NONE DETECTED (Cut Off Level 300 ng/mL) Final   POC Methamphetamine UR 12/03/2022 None Detected  NONE DETECTED (Cut Off Level 1000 ng/mL) Final   POC Morphine 12/03/2022 None Detected  NONE DETECTED (Cut Off Level 300 ng/mL) Final   POC Methadone UR 12/03/2022 None Detected  NONE DETECTED (Cut Off Level 300 ng/mL) Final   POC Oxycodone UR 12/03/2022 None Detected  NONE DETECTED (Cut Off Level 100 ng/mL) Final   POC Marijuana UR 12/03/2022 Positive (A)  NONE DETECTED (Cut Off Level 50 ng/mL) Final    Allergies: Pristiq [desvenlafaxine succinate er], Desvenlafaxine, Other, and Sulfa antibiotics  Medications:  Facility Ordered Medications  Medication   [COMPLETED] lamoTRIgine (LAMICTAL) tablet 100 mg   acetaminophen (TYLENOL) tablet 650 mg   alum & mag hydroxide-simeth (MAALOX/MYLANTA) 200-200-20 MG/5ML suspension 30 mL   magnesium hydroxide (MILK OF MAGNESIA) suspension 30 mL   OLANZapine zydis (ZYPREXA) disintegrating tablet 10 mg   And   LORazepam (ATIVAN) tablet 1 mg   And   ziprasidone (GEODON) injection 20 mg   lamoTRIgine (LAMICTAL) tablet 100 mg   nicotine (NICODERM CQ - dosed in mg/24 hours) patch 14 mg   propranolol (INDERAL) tablet 10 mg   PTA Medications  Medication Sig   hydrOXYzine (ATARAX) 50 MG tablet Take 1 tablet (50 mg total) by mouth every 6 (six) hours as needed for anxiety.   baclofen (LIORESAL) 10 MG tablet Take 1 tablet (10 mg total) by mouth 3 (three) times daily.    busPIRone (BUSPAR) 10 MG tablet Take 1 tablet (10 mg total) by mouth 3 (three) times daily. (Patient not taking: Reported on 12/04/2022)   QUEtiapine (SEROQUEL) 50 MG tablet  Take 1 tablet (50 mg total) by mouth at bedtime. (Patient not taking: Reported on 12/04/2022)   gabapentin (NEURONTIN) 300 MG capsule Take 1 capsule (300 mg total) by mouth at bedtime. (Patient not taking: Reported on 12/04/2022)   OXcarbazepine (TRILEPTAL) 150 MG tablet Take 1 tablet (150 mg total) by mouth 2 (two) times daily. (Patient not taking: Reported on 12/04/2022)   pantoprazole (PROTONIX) 40 MG tablet Take 40 mg by mouth daily. (Patient not taking: Reported on 12/04/2022)   lamoTRIgine (LAMICTAL) 100 MG tablet Take 100 mg by mouth 2 (two) times daily. (Patient not taking: Reported on 12/04/2022)    Long Term Goals: Improvement in symptoms so as ready for discharge  Short Term Goals: Patient will verbalize feelings in meetings with treatment team members., Patient will attend at least of 50% of the groups daily., Pt will complete the PHQ9 on admission, day 3 and discharge., Patient will participate in completing the [place] Suicide Severity Rating Scale, Patient will score a low risk of violence for 24 hours prior to discharge, and Patient will take medications as prescribed daily.  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 Patient's presentation of dysphoria, anhedonia, hopelessness,  guilt, passive SI, and insomnia in the setting of polysubstance use is consistent with recurrent MDD in the context of polysubstance use including fentanyl, cocaine, and THC. It is unclear if this is a true MDD recurrent episode, versus substance induced mood disorder. We will restart patient's home medications at this time and monitor for seizure activity or withdrawal symptoms. Considering patient's failed trials of prozac, zoloft, and pristiq in the past, we will consider starting mirtazapine tomorrow evening to improve mood, appetite, and sleep.   Psychiatric Diagnoses and Treatment:   # Polysubstance Abuse Strongly encourage patient to stop all fentanyl, cocaine, and THC use Monitor vitals and symptoms for opioid withdrawal Start loperamide 2-4 mg prn for diarrhea or loose stools Start milk of magnesia 30 mL daily prn for constipation Start zofran 4 mg Q6H prn for nausea/vomiting Start ativan 1 mg Q6H prn for CIWA > 10   # Recurrent MDD, moderate vs Substance Induced Mood disorder Start hydroxyzine 50 mg prn for anxiety Consider starting mirtazapine tomorrow evening to improve mood, sleep, and appetite    # Seizure Disorder Start lamotrigine 100 mg BID for seizures and impulsivity Consider lipid panel tomorrow    # Neuropathic Pain Start baclofen 10 mg TID for nerve pain     Other Labs/Imaging Reviewed: UDS positive for benzodiazepines and THC BAL < 10  EKG on 12/03/2022: QTc 424   Disposition: will assess with primary team      Recommendations  Based on my evaluation the patient does not appear to have an emergency medical condition.  Carolan Shiver, Medical Student 12/04/22  11:14 AM   Carolan Shiver, Medical Student 12/04/22 [phone]

## 2022-12-04 NOTE — ED Notes (Signed)
Pt able to tolerate food with no issues. Pt also ambulated to the bathroom

## 2022-12-04 NOTE — ED Provider Notes (Signed)
[facility] Continuous Assessment Admission H&P  Date: 12/04/22 Patient Name: Jane Watkins MRN: [id number] Chief Complaint: suicidal ideation to OD   Diagnoses:  Final diagnoses:  Suicidal ideation  Polysubstance abuse (HCC)  Seizures (HCC)    HPI: Jane Watkins, 27 y/o female with a history of Fentanyl abuse, suicidal ideation, bipolar disorder, anxiety, seizure disorder, polysubstance abuse.  Patient was sent out to [facility]: ED for medical clearance due to seizure activity.  Patient is now medically cleared and has returned to [facility].  Patient was given Lamictal 100 mg in the ED.  New orders for Lamictal 100 mg daily and propranolol 10 mg is now scheduled daily.  Copied from [facility] notes: This is a 27 year old female with a history of seizures, right MCA stroke with left-sided residual deficits, polysubstance abuse, bipolar disorder who presents with concern for seizure-like activity. Patient was transferred from behavioral health urgent care. Concern for seizure activity over there. Patient states that she is on Lamictal. She did not take her Lamictal yesterday. She is on 100 mg daily. She states her last seizure prior to earlier this evening was 3 or 4 days ago. Typically her seizure activity corresponds with how compliant she is with her medications. She does not recall having a seizure at behavioral health urgent care. Per chart review, she had an episode of altered awareness with stiffening of the limbs and was noted to be postictal. Patient reports ongoing substance abuse including cocaine and narcotics. Denies alcohol abuse or withdrawal history. Patient reports that she reported to behavioral health urgent care because she no longer felt safe and was suicidal.    Face-to-face observation of patient, patient is alert and oriented x 4, speech is clear.  Patient reports she is feeling a little bit better.  Showed no sign of distress at this time.  We will continue plan of care patient to  return to [facility] due to suicidal ideation and polysubstance abuse. See prior admission notes  Recommend inpatient observation   Total Time spent with patient: 15 minutes  Musculoskeletal  Strength & Muscle Tone: within normal limits Gait & Station: normal Patient leans: N/A  Psychiatric Specialty Exam  Presentation General Appearance:  Casual  Eye Contact: Good  Speech: Clear and Coherent  Speech Volume: Normal  Handedness: Right   Mood and Affect  Mood: Anxious  Affect: Appropriate   Thought Process  Thought Processes: Coherent  Descriptions of Associations:Intact  Orientation:Full (Time, Place and Person)  Thought Content:WDL  Diagnosis of Schizophrenia or Schizoaffective disorder in past: No   Hallucinations:Hallucinations: None  Ideas of Reference:None  Suicidal Thoughts:Suicidal Thoughts: Yes, Passive SI Active Intent and/or Plan: With Intent; With Plan SI Passive Intent and/or Plan: With Intent; With Plan  Homicidal Thoughts:Homicidal Thoughts: No   Sensorium  Memory: Immediate Fair  Judgment: Poor  Insight: Fair   Chartered certified accountant: Good  Attention Span: Good  Recall: Good  Fund of Knowledge: Good  Language: Good   Psychomotor Activity  Psychomotor Activity: Psychomotor Activity: Normal   Assets  Assets: Desire for Improvement   Sleep  Sleep: Sleep: Fair Number of Hours of Sleep: 5   Nutritional Assessment (For OBS and FBC admissions only) Has the patient had a weight loss or gain of 10 pounds or more in the last 3 months?: No Has the patient had a decrease in food intake/or appetite?: No Does the patient have dental problems?: No Does the patient have eating habits or behaviors that may be indicators  of an eating disorder including binging or inducing vomiting?: No Has the patient recently lost weight without trying?: 0 Has the patient been eating poorly because of a decreased  appetite?: 0 Malnutrition Screening Tool Score: 0    Physical Exam HENT:     Head: Normocephalic.     Nose: Nose normal.  Eyes:     Pupils: Pupils are equal, round, and reactive to light.  Cardiovascular:     Rate and Rhythm: Normal rate.  Pulmonary:     Effort: Pulmonary effort is normal.  Musculoskeletal:        General: Normal range of motion.     Cervical back: Normal range of motion.  Neurological:     General: No focal deficit present.     Mental Status: She is alert.  Psychiatric:        Mood and Affect: Mood normal.        Behavior: Behavior normal.        Thought Content: Thought content normal.        Judgment: Judgment normal.    Review of Systems  Constitutional: Negative.   HENT: Negative.    Eyes: Negative.   Respiratory: Negative.    Cardiovascular: Negative.   Gastrointestinal: Negative.   Genitourinary: Negative.   Musculoskeletal: Negative.   Skin: Negative.   Neurological: Negative.   Psychiatric/Behavioral:  Positive for substance abuse and suicidal ideas. The patient is nervous/anxious.     Blood pressure 118/72, pulse 68, temperature 98.5 F (36.9 C), temperature source Oral, resp. rate 18, SpO2 98%. There is no height or weight on file to calculate BMI.  Past Psychiatric History: ploysubstance abuse abuse, fentanyl abuse, seizure disorder, bipolar disorder,  Is the patient at risk to self? Yes  Has the patient been a risk to self in the past 6 months? Yes .    Has the patient been a risk to self within the distant past? Yes   Is the patient a risk to others? No   Has the patient been a risk to others in the past 6 months? No   Has the patient been a risk to others within the distant past? No   Past Medical History: see chart   Family History: unknown  Social History: opioid abuse  Last Labs:  Admission on 12/03/2022, Discharged on 12/04/2022  Component Date Value Ref Range Status   WBC 12/03/2022 8.4  4.0 - 10.5 K/uL Final   RBC  12/03/2022 4.34  3.87 - 5.11 MIL/uL Final   Hemoglobin 12/03/2022 13.1  12.0 - 15.0 g/dL Final   HCT 56/38/7564 39.2  36.0 - 46.0 % Final   MCV 12/03/2022 90.3  80.0 - 100.0 fL Final   MCH 12/03/2022 30.2  26.0 - 34.0 pg Final   MCHC 12/03/2022 33.4  30.0 - 36.0 g/dL Final   RDW 33/29/5188 12.9  11.5 - 15.5 % Final   Platelets 12/03/2022 273  150 - 400 K/uL Final   nRBC 12/03/2022 0.0  0.0 - 0.2 % Final   Neutrophils Relative % 12/03/2022 59  % Final   Neutro Abs 12/03/2022 4.9  1.7 - 7.7 K/uL Final   Lymphocytes Relative 12/03/2022 31  % Final   Lymphs Abs 12/03/2022 2.6  0.7 - 4.0 K/uL Final   Monocytes Relative 12/03/2022 8  % Final   Monocytes Absolute 12/03/2022 0.7  0.1 - 1.0 K/uL Final   Eosinophils Relative 12/03/2022 1  % Final   Eosinophils Absolute 12/03/2022 0.1  0.0 -  0.5 K/uL Final   Basophils Relative 12/03/2022 1  % Final   Basophils Absolute 12/03/2022 0.1  0.0 - 0.1 K/uL Final   Immature Granulocytes 12/03/2022 0  % Final   Abs Immature Granulocytes 12/03/2022 0.02  0.00 - 0.07 K/uL Final   Performed at [facility], [address], [place], [place] [address]   Sodium 12/03/2022 135  135 - 145 mmol/L Final   Potassium 12/03/2022 3.5  3.5 - 5.1 mmol/L Final   Chloride 12/03/2022 102  98 - 111 mmol/L Final   CO2 12/03/2022 19 (L)  22 - 32 mmol/L Final   Glucose, Bld 12/03/2022 76  70 - 99 mg/dL Final   Glucose reference range applies only to samples taken after fasting for at least 8 hours.   BUN 12/03/2022 8  6 - 20 mg/dL Final   Creatinine, Ser 12/03/2022 0.46  0.44 - 1.00 mg/dL Final   Calcium 19/14/7829 9.6  8.9 - 10.3 mg/dL Final   Total Protein 56/21/3086 6.4 (L)  6.5 - 8.1 g/dL Final   Albumin 57/84/6962 4.5  3.5 - 5.0 g/dL Final   AST 95/28/4132 16  15 - 41 U/L Final   ALT 12/03/2022 14  0 - 44 U/L Final   Alkaline Phosphatase 12/03/2022 37 (L)  38 - 126 U/L Final   Total Bilirubin 12/03/2022 1.9 (H)  0.3 - 1.2 mg/dL Final   GFR, Estimated 12/03/2022  >60  >60 mL/min Final   Comment: (NOTE) Calculated using the CKD-EPI Creatinine Equation (2021)    Anion gap 12/03/2022 14  5 - 15 Final   Performed at [facility], [address], [place], [place] [address]   Alcohol, Ethyl (B) 12/03/2022 <10  <10 mg/dL Final   Comment: (NOTE) Lowest detectable limit for serum alcohol is 10 mg/dL.  For medical purposes only. Performed at [facility], [address] [place], [place] [address]    TSH 12/03/2022 0.996  0.350 - 4.500 uIU/mL Final   Comment: Performed by a 3rd Generation assay with a functional sensitivity of <=0.01 uIU/mL. Performed at [facility], [address], [place], [place] [address]    Preg Test, Ur 12/03/2022 Negative  Negative Final   POC Amphetamine UR 12/03/2022 None Detected  NONE DETECTED (Cut Off Level 1000 ng/mL) Final   POC Secobarbital (BAR) 12/03/2022 None Detected  NONE DETECTED (Cut Off Level 300 ng/mL) Final   POC Buprenorphine (BUP) 12/03/2022 None Detected  NONE DETECTED (Cut Off Level 10 ng/mL) Final   POC Oxazepam (BZO) 12/03/2022 Positive (A)  NONE DETECTED (Cut Off Level 300 ng/mL) Final   POC Cocaine UR 12/03/2022 None Detected  NONE DETECTED (Cut Off Level 300 ng/mL) Final   POC Methamphetamine UR 12/03/2022 None Detected  NONE DETECTED (Cut Off Level 1000 ng/mL) Final   POC Morphine 12/03/2022 None Detected  NONE DETECTED (Cut Off Level 300 ng/mL) Final   POC Methadone UR 12/03/2022 None Detected  NONE DETECTED (Cut Off Level 300 ng/mL) Final   POC Oxycodone UR 12/03/2022 None Detected  NONE DETECTED (Cut Off Level 100 ng/mL) Final   POC Marijuana UR 12/03/2022 Positive (A)  NONE DETECTED (Cut Off Level 50 ng/mL) Final    Allergies: Pristiq [desvenlafaxine succinate er], Desvenlafaxine, Other, and Sulfa antibiotics  Medications:  Facility Ordered Medications  Medication   [COMPLETED] lamoTRIgine (LAMICTAL) tablet 100 mg   acetaminophen (TYLENOL) tablet 650 mg   alum & mag  hydroxide-simeth (MAALOX/MYLANTA) 200-200-20 MG/5ML suspension  30 mL   magnesium hydroxide (MILK OF MAGNESIA) suspension 30 mL   OLANZapine zydis (ZYPREXA) disintegrating tablet 10 mg   And   LORazepam (ATIVAN) tablet 1 mg   And   ziprasidone (GEODON) injection 20 mg   lamoTRIgine (LAMICTAL) tablet 100 mg   nicotine (NICODERM CQ - dosed in mg/24 hours) patch 14 mg   propranolol (INDERAL) tablet 10 mg   PTA Medications  Medication Sig   naloxone (NARCAN) nasal spray 4 mg/0.1 mL ADMINISTER A SINGLE SPRAY IN ONE NOSTRIL UPON SIGNS OF OPIOID OVERDOSE. REPEAT EVERY 2 - 3 MINUTES IF NO RESPONSE IN ALTERNATING NOSTRIL TIL EMS ARRIVES.   SUMAtriptan (IMITREX) 50 MG tablet Take 1 tablet by mouth every 2 hours as needed for migraine. May repeat in 2 hours if headache persists or recurs.   busPIRone (BUSPAR) 10 MG tablet Take 1 tablet (10 mg total) by mouth 3 (three) times daily.   hydrOXYzine (ATARAX) 50 MG tablet Take 1 tablet (50 mg total) by mouth every 6 (six) hours as needed for anxiety.   nicotine (NICODERM CQ - DOSED IN MG/24 HOURS) 14 mg/24hr patch Place 1 patch (14 mg total) onto the skin daily.   QUEtiapine (SEROQUEL) 50 MG tablet Take 1 tablet (50 mg total) by mouth at bedtime.   docusate sodium (COLACE) 100 MG capsule Take 1 capsule (100 mg total) by mouth 2 (two) times daily.   baclofen (LIORESAL) 10 MG tablet Take 1 tablet (10 mg total) by mouth 3 (three) times daily.   gabapentin (NEURONTIN) 300 MG capsule Take 1 capsule (300 mg total) by mouth at bedtime.   lamoTRIgine (LAMICTAL) 25 MG tablet Take 1 tablet (25 mg total) by mouth 2 (two) times daily.   OXcarbazepine (TRILEPTAL) 150 MG tablet Take 1 tablet (150 mg total) by mouth 2 (two) times daily.   acyclovir ointment (ZOVIRAX) 5 % Apply topically every 3 (three) hours.      Medical Decision Making  Inpatient observation  Lab Orders  No laboratory test(s) ordered today      Meds ordered this encounter  Medications    acetaminophen (TYLENOL) tablet 650 mg   alum & mag hydroxide-simeth (MAALOX/MYLANTA) 200-200-20 MG/5ML suspension 30 mL   magnesium hydroxide (MILK OF MAGNESIA) suspension 30 mL   AND Linked Order Group    OLANZapine zydis (ZYPREXA) disintegrating tablet 10 mg    LORazepam (ATIVAN) tablet 1 mg    ziprasidone (GEODON) injection 20 mg   lamoTRIgine (LAMICTAL) tablet 100 mg   nicotine (NICODERM CQ - dosed in mg/24 hours) patch 14 mg   propranolol (INDERAL) tablet 10 mg     Recommendations  Based on my evaluation the patient does not appear to have an emergency medical condition.  Sindy Guadeloupe, NP 12/04/22  6:47 AM

## 2022-12-04 NOTE — ED Notes (Signed)
MHT notified this writer at 0000 that pt was having some involuntary movement. Assessed pt. Pt appears to be seizing. Rigidity in muscles. Kept pt on side and removed pillow. Stayed with pt and called for provider. Incident lasted from 0000 to 0002. Pt increased work of breathing. Pt VS being assessed. Pt in postictal period. Provider notified. Will send pt out for medical clearance.

## 2022-12-04 NOTE — Progress Notes (Signed)
Pt picked up by EMS for transport to [facility]. Pt awake and responding verbally. Understands that she will be transported to hospital.

## 2022-12-04 NOTE — ED Provider Notes (Signed)
Writer was notify that patient was having a seizure,  upon going to the unit patient was observe in bed,  vital sign were been taken by nursing staff. Vital sign WNL.upon assessment patient was given sternal rub.  Pt did open her yes and responded to stimuli.   Pt was till in he postictal stage.   [facility] doctor was notify that patient was going to be transported there.  Writer spoke with  Horton MD who will be accepting patient.  Pt can return to [facility] when medically cleared.

## 2022-12-04 NOTE — Progress Notes (Addendum)
Pt was transferred from [facility] and admitted to [facility] due to [facility] with plan to overdose and substance abuse. Pt currently denies and verbally contracts for safety on the unit. Pt was advised to inform staff when having intrusive thoughts to hurt self or others. Pt is alert and oriented X3. Pt is ambulatory with shuffling gait and left side deficit due to hx of stroke. Pt is oriented to staff/unit. Pt was cooperative with admission process and skin assessment. Pt denies pain and current HI/AVH. 15 minutes safety checks initiated per order. Staff will monitor for pt's safety.

## 2022-12-04 NOTE — ED Notes (Signed)
Patient  sleeping in no acute stress. RR even and unlabored .Environment secured .Will continue to monitor for safely. 

## 2022-12-04 NOTE — ED Notes (Addendum)
Pt is in the dayroom watching TV with peers. Pt finds it difficult to open chips sachet and fruit cups, writer helped her out and informed her to call for help whenever she needs. Pt has left sided weakness . Pt denies SI/HI/AVH. No acute distress noted. Will continue to monitor for safety and provide support whenever needed.

## 2022-12-04 NOTE — ED Provider Notes (Signed)
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 Patient states that she is on Lamictal. She did not take her Lamictal yesterday. She is on 100 mg daily. She states her last seizure prior to earlier this evening was 3 or 4 days ago. Typically her seizure activity corresponds with how compliant she is with her medications. Patient reports ongoing substance abuse including cocaine and narcotics. Denies alcohol abuse or withdrawal history. Patient states she lives with her boyfriend, and he wants her to get help with polysubstance abuse, she says he only abuses marijuana. She wants to be able to talk with someone to deal with what's causing her to get "hihg". She states she has been using for 8 yrs, and her longest sober period was a little under 2 years from 2021-2023, and her family is her biggest support, her mother and father. She relapses and has no idea what makes her relapse. Denies SI/HI/AVH.   Face-to-face observation of patient, patient is alert and oriented x 4, speech is clear.  Patient reports she is feeling a little bit better.  Showed no sign of distress at this time.  We will continue plan of care patient to return to [facility] due to suicidal ideation and polysubstance abuse. See prior admission notes    Total Time spent with patient: 15 minutes  Past Psychiatric History: ploysubstance abuse abuse, fentanyl abuse, seizure disorder, bipolar disorder,   Is the patient at  risk to self? Yes  Has the patient been a risk to self in the past 6 months? Yes .    Has the patient been a risk to self within the distant past? Yes   Is the patient a risk to others? No   Has the patient been a risk to others in the past 6 months? No   Has the patient been a risk to others within the distant past? No    Past Medical History: see chart    Family History: unknown   Social History: opioid abuse  Current Medications:  Current Facility-Administered Medications  Medication Dose Route Frequency Provider Last Rate Last Admin   acetaminophen (TYLENOL) tablet 650 mg  650 mg Oral Q6H PRN Sindy Guadeloupe, NP       alum & mag hydroxide-simeth (MAALOX/MYLANTA) 200-200-20 MG/5ML suspension 30 mL  30 mL Oral Q4H PRN Sindy Guadeloupe, NP       lamoTRIgine (LAMICTAL) tablet 100 mg  100 mg Oral Daily Sindy Guadeloupe, NP       OLANZapine zydis (ZYPREXA) disintegrating tablet 10 mg  10 mg Oral Q8H PRN Sindy Guadeloupe, NP       And   LORazepam (ATIVAN) tablet 1 mg  1 mg Oral PRN Sindy Guadeloupe, NP       And   ziprasidone (GEODON) injection 20 mg  20 mg Intramuscular PRN Sindy Guadeloupe, NP       magnesium hydroxide (MILK OF MAGNESIA) suspension 30 mL  30 mL Oral Daily PRN Sindy Guadeloupe, NP       nicotine (NICODERM CQ -  dosed in mg/24 hours) patch 14 mg  14 mg Transdermal Daily Sindy Guadeloupe, NP       propranolol (INDERAL) tablet 10 mg  10 mg Oral Daily Sindy Guadeloupe, NP       Current Outpatient Medications  Medication Sig Dispense Refill   baclofen (LIORESAL) 10 MG tablet Take 1 tablet (10 mg total) by mouth 3 (three) times daily. 90 tablet 0   hydrOXYzine (ATARAX) 50 MG tablet Take 1 tablet (50 mg total) by mouth every 6 (six) hours as needed for anxiety. 60 tablet 0   atorvastatin (LIPITOR) 20 MG tablet Take 20 mg by mouth daily. (Patient not taking: Reported on 12/04/2022)     busPIRone (BUSPAR) 10 MG tablet Take 1 tablet (10 mg total) by mouth 3 (three) times daily. (Patient not taking:  Reported on 12/04/2022) 90 tablet 0   gabapentin (NEURONTIN) 300 MG capsule Take 1 capsule (300 mg total) by mouth at bedtime. (Patient not taking: Reported on 12/04/2022) 30 capsule 0   lamoTRIgine (LAMICTAL) 100 MG tablet Take 100 mg by mouth 2 (two) times daily. (Patient not taking: Reported on 12/04/2022)     OXcarbazepine (TRILEPTAL) 150 MG tablet Take 1 tablet (150 mg total) by mouth 2 (two) times daily. (Patient not taking: Reported on 12/04/2022) 60 tablet 0   pantoprazole (PROTONIX) 40 MG tablet Take 40 mg by mouth daily. (Patient not taking: Reported on 12/04/2022)     propranolol (INDERAL) 10 MG tablet Take 10 mg by mouth 2 (two) times daily. (Patient not taking: Reported on 12/04/2022)     QUEtiapine (SEROQUEL) 50 MG tablet Take 1 tablet (50 mg total) by mouth at bedtime. (Patient not taking: Reported on 12/04/2022) 30 tablet 0    PTA Medications:  Facility Ordered Medications  Medication   [COMPLETED] lamoTRIgine (LAMICTAL) tablet 100 mg   acetaminophen (TYLENOL) tablet 650 mg   alum & mag hydroxide-simeth (MAALOX/MYLANTA) 200-200-20 MG/5ML suspension 30 mL   magnesium hydroxide (MILK OF MAGNESIA) suspension 30 mL   OLANZapine zydis (ZYPREXA) disintegrating tablet 10 mg   And   LORazepam (ATIVAN) tablet 1 mg   And   ziprasidone (GEODON) injection 20 mg   lamoTRIgine (LAMICTAL) tablet 100 mg   nicotine (NICODERM CQ - dosed in mg/24 hours) patch 14 mg   propranolol (INDERAL) tablet 10 mg   PTA Medications  Medication Sig   hydrOXYzine (ATARAX) 50 MG tablet Take 1 tablet (50 mg total) by mouth every 6 (six) hours as needed for anxiety.   baclofen (LIORESAL) 10 MG tablet Take 1 tablet (10 mg total) by mouth 3 (three) times daily.   busPIRone (BUSPAR) 10 MG tablet Take 1 tablet (10 mg total) by mouth 3 (three) times daily. (Patient not taking: Reported on 12/04/2022)   QUEtiapine (SEROQUEL) 50 MG tablet Take 1 tablet (50 mg total) by mouth at bedtime. (Patient not taking:  Reported on 12/04/2022)   gabapentin (NEURONTIN) 300 MG capsule Take 1 capsule (300 mg total) by mouth at bedtime. (Patient not taking: Reported on 12/04/2022)   OXcarbazepine (TRILEPTAL) 150 MG tablet Take 1 tablet (150 mg total) by mouth 2 (two) times daily. (Patient not taking: Reported on 12/04/2022)   pantoprazole (PROTONIX) 40 MG tablet Take 40 mg by mouth daily. (Patient not taking: Reported on 12/04/2022)   lamoTRIgine (LAMICTAL) 100 MG tablet Take 100 mg by mouth 2 (two) times daily. (Patient not taking: Reported on 12/04/2022)       12/04/2022   10:31 AM 10/11/2021  11:14 AM 02/08/2020    2:50 PM  Depression screen PHQ 2/9  Decreased Interest 1 1 3   Down, Depressed, Hopeless 1 1 3   PHQ - 2 Score 2 2 6   Altered sleeping 0    Tired, decreased energy 1    Change in appetite 0    Feeling bad or failure about yourself  1    Trouble concentrating 0    Moving slowly or fidgety/restless 0    Suicidal thoughts 0    PHQ-9 Score 4    Difficult doing work/chores Not difficult at all      Flowsheet Row ED from 12/04/2022 in [facility] Most recent reading at 12/04/2022  5:45 AM ED from 12/04/2022 in [facility] at [facility] Most recent reading at 12/04/2022  1:23 AM ED from 12/03/2022 in [facility] Most recent reading at 12/03/2022 10:52 PM  C-SSRS RISK CATEGORY High Risk No Risk High Risk       Musculoskeletal  Strength & Muscle Tone: within normal limits Gait & Station: normal Patient leans: N/A  Psychiatric Specialty Exam  Presentation  General Appearance:  Disheveled  Eye Contact: Good  Speech: Clear and Coherent; Slow  Speech Volume: Normal  Handedness: Right   Mood and Affect  Mood: Depressed  Affect: Appropriate   Thought Process  Thought Processes: Coherent  Descriptions of Associations:Intact  Orientation:Full (Time, Place and Person)  Thought  Content:WDL  Diagnosis of Schizophrenia or Schizoaffective disorder in past: No    Hallucinations:Hallucinations: None  Ideas of Reference:None  Suicidal Thoughts:Suicidal Thoughts: No SI Active Intent and/or Plan: With Intent; With Plan SI Passive Intent and/or Plan: With Intent; With Plan  Homicidal Thoughts:Homicidal Thoughts: No   Sensorium  Memory: Immediate Good; Recent Good  Judgment: Fair  Insight: Fair   Executive Functions  Concentration: Good  Attention Span: Good  Recall: Good  Fund of Knowledge: Good  Language: Good   Psychomotor Activity  Psychomotor Activity: Psychomotor Activity: Normal   Assets  Assets: Communication Skills; Desire for Improvement; Social Support; Housing   Sleep  Sleep: Sleep: Fair Number of Hours of Sleep: 5   Nutritional Assessment (For OBS and FBC admissions only) Has the patient had a weight loss or gain of 10 pounds or more in the last 3 months?: No Has the patient had a decrease in food intake/or appetite?: No Does the patient have dental problems?: No Does the patient have eating habits or behaviors that may be indicators of an eating disorder including binging or inducing vomiting?: No Has the patient recently lost weight without trying?: 0 Has the patient been eating poorly because of a decreased appetite?: 0 Malnutrition Screening Tool Score: 0    Physical Exam  Physical Exam Vitals and nursing note reviewed. Exam conducted with a chaperone present.  Neurological:     Mental Status: She is alert.  Psychiatric:        Attention and Perception: Attention normal.        Mood and Affect: Mood normal.        Speech: Speech is slurred.        Behavior: Behavior is cooperative.        Thought Content: Thought content normal.        Cognition and Memory: Memory normal.        Judgment: Judgment is inappropriate.    Review of Systems  Psychiatric/Behavioral:  Positive for substance abuse.     Blood pressure  118/72, pulse 68, temperature 98.5 F (36.9 C), temperature source Oral, resp. rate 18, SpO2 98%. There is no height or weight on file to calculate BMI.  Demographic Factors:  Caucasian and Unemployed  Loss Factors: Decline in physical health  Historical Factors: Impulsivity  Risk Reduction Factors:   Sense of responsibility to family, Living with another person, especially a relative, and Positive social support   Suicide Risk:  Minimal: No identifiable suicidal ideation.  Patients presenting with no risk factors but with morbid ruminations; may be classified as minimal risk based on the severity of the depressive symptoms   Disposition: Recommend admit to [facility]   Arcadia Gorgas MOTLEY-MANGRUM, PMHNP 12/04/2022, 10:32 AM

## 2022-12-04 NOTE — ED Notes (Signed)
Fbc rn states she will call when she is ready for patient  to come over.

## 2022-12-04 NOTE — ED Triage Notes (Signed)
Pt bib [facility] from [facility] patient had grandmal  seizure that lasted for two minutes starting at midnight. A&o x4 with ems. Pt has hx seizure Bp 101/60 Hr- 80 96% RA  Cbg 89

## 2022-12-04 NOTE — ED Notes (Signed)
Pt sleeping at present, no distress noted.  Monitoring for safety. 

## 2022-12-04 NOTE — ED Notes (Signed)
Pt was provided dinner.

## 2022-12-04 NOTE — ED Notes (Signed)
Pt discharged to [facility] via safe transport

## 2022-12-04 NOTE — ED Provider Notes (Signed)
[place] EMERGENCY DEPARTMENT AT [facility] Provider Note   CSN: [id number] Arrival date & time: 12/04/22  0111     History  Chief Complaint  Patient presents with   Seizures    Jane Watkins is a 27 y.o. female.  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  Patient was transferred from behavioral health urgent care.  Concern for seizure activity over there.  Patient states that she is on Lamictal.  She did not take her Lamictal yesterday.  She is on 100 mg daily.  She states her last seizure prior to earlier this evening was 3 or 4 days ago.  Typically her seizure activity corresponds with how compliant she is with her medications.  She does not recall having a seizure at behavioral health urgent care.  Per chart review, she had an episode of altered awareness with stiffening of the limbs and was noted to be postictal.  Patient reports ongoing substance abuse including cocaine and narcotics.  Denies alcohol abuse or withdrawal history.  Patient reports that she reported to behavioral health urgent care because she no longer felt safe and was suicidal.  Home Medications Prior to Admission medications   Medication Sig Start Date End Date Taking? Authorizing Provider  acyclovir ointment (ZOVIRAX) 5 % Apply topically every 3 (three) hours. 01/27/22   Clapacs, [name], MD  baclofen (LIORESAL) 10 MG tablet Take 1 tablet (10 mg total) by mouth 3 (three) times daily. 01/27/22   Clapacs, [name], MD  busPIRone (BUSPAR) 10 MG tablet Take 1 tablet (10 mg total) by mouth 3 (three) times daily. 01/27/22   Clapacs, [name], MD  docusate sodium (COLACE) 100 MG capsule Take 1 capsule (100 mg total) by mouth 2 (two) times daily. 01/27/22   Clapacs, [name], MD  gabapentin (NEURONTIN) 300 MG capsule Take 1 capsule (300 mg total) by  mouth at bedtime. 01/27/22   Clapacs, [name], MD  hydrOXYzine (ATARAX) 50 MG tablet Take 1 tablet (50 mg total) by mouth every 6 (six) hours as needed for anxiety. 01/27/22   Clapacs, [name], MD  lamoTRIgine (LAMICTAL) 25 MG tablet Take 1 tablet (25 mg total) by mouth 2 (two) times daily. 01/27/22   Clapacs, [name], MD  naloxone [facility]) nasal spray 4 mg/0.1 mL ADMINISTER A SINGLE SPRAY IN ONE NOSTRIL UPON SIGNS OF OPIOID OVERDOSE. REPEAT EVERY 2 - 3 MINUTES IF NO RESPONSE IN ALTERNATING NOSTRIL TIL EMS ARRIVES. 10/30/20   [provider]  nicotine (NICODERM CQ - DOSED IN MG/24 HOURS) 14 mg/24hr patch Place 1 patch (14 mg total) onto the skin daily. 01/28/22   Clapacs, [name], MD  OXcarbazepine (TRILEPTAL) 150 MG tablet Take 1 tablet (150 mg total) by mouth 2 (two) times daily. 01/27/22   Clapacs, [name], MD  QUEtiapine (SEROQUEL) 50 MG tablet Take 1 tablet (50 mg total) by mouth at bedtime. 01/27/22   Clapacs, [name], MD  SUMAtriptan (IMITREX) 50 MG tablet Take 1 tablet by mouth every 2 hours as needed for migraine. May repeat in 2 hours if headache persists or recurs. 01/27/22   Clapacs, [name], MD      Allergies    Pristiq [desvenlafaxine succinate er], Desvenlafaxine, Other, and Sulfa antibiotics    Review of Systems   Review of Systems  Constitutional:  Negative  for fever.  Respiratory:  Negative for shortness of breath.   Cardiovascular:  Negative for chest pain.  Neurological:  Positive for seizures.  All other systems reviewed and are negative.   Physical Exam Updated Vital Signs BP 110/60 (BP Location: Right Arm)   Pulse 82   Temp 98.2 F (36.8 C) (Oral)   SpO2 99%  Physical Exam Vitals and nursing note reviewed.  Constitutional:      Appearance: She is well-developed. She is not ill-appearing.  HENT:     Head: Normocephalic and atraumatic.     Mouth/Throat:     Mouth: Mucous membranes are moist.  Eyes:     Extraocular Movements: Extraocular movements intact.      Pupils: Pupils are equal, round, and reactive to light.  Cardiovascular:     Rate and Rhythm: Normal rate and regular rhythm.     Heart sounds: Normal heart sounds.  Pulmonary:     Effort: Pulmonary effort is normal. No respiratory distress.     Breath sounds: No wheezing.  Abdominal:     Palpations: Abdomen is soft.  Musculoskeletal:     Cervical back: Neck supple.  Skin:    General: Skin is warm and dry.  Neurological:     Mental Status: She is alert and oriented to person, place, and time.     Comments: Contracture left hand with decreased strength  Psychiatric:        Mood and Affect: Mood normal.     Comments: Flat affect     ED Results / Procedures / Treatments   Labs (all labs ordered are listed, but only abnormal results are displayed) Labs Reviewed - No data to display  EKG None  Radiology No results found.  Procedures Procedures    Medications Ordered in ED Medications  lamoTRIgine (LAMICTAL) tablet 100 mg (100 mg Oral Given 12/04/22 0138)    ED Course/ Medical Decision Making/ A&P                                 Medical Decision Making Risk Prescription drug management.   This patient presents to the ED for concern of seizure, this involves an extensive number of treatment options, and is a complaint that carries with it a high risk of complications and morbidity.  I considered the following differential and admission for this acute, potentially life threatening condition.  The differential diagnosis includes seizure, withdrawal, polysubstance abuse  MDM:    This is a 27 year old female who presents with seizure-like activity from behavioral health urgent care.  She is nontoxic and vital signs are reassuring.  She is awake, alert, oriented.  She has residual left-sided deficits which she states are her baseline.  She indicates that she has not taken her Lamictal in the last 24 hours.  She is supposed to be on 100 mg.  Last several Lamictal levels  have been low.  This indicates likely noncompliance.  She was given a dose of Lamictal and monitor closely for approximately 3 hours with no further events.  Labs were obtained at behavioral health urgent care and reviewed.  No significant metabolic derangements.  Alcohol level negative and doubt withdrawal.  As advised NP at urgent care that patient would be returning for further evaluation for her substance abuse issues.  Advised that she needs to be placed on her daily seizure medication.  (Labs, imaging, consults)  Labs: I Ordered, and personally interpreted labs.  The pertinent results include: Labs reviewed from urgent care  Imaging Studies ordered: I ordered imaging studies including N/A I independently visualized and interpreted imaging. I agree with the radiologist interpretation  Additional history obtained from chart review.  External records from outside source obtained and reviewed including urgent care notes  Cardiac Monitoring: The patient was maintained on a cardiac monitor.  If on the cardiac monitor, I personally viewed and interpreted the cardiac monitored which showed an underlying rhythm of: Sinus rhythm  Reevaluation: After the interventions noted above, I reevaluated the patient and found that they have :improved  Social Determinants of Health:  polysubstance abuse  Disposition: Transfer to behavioral health urgent care  Co morbidities that complicate the patient evaluation  Past Medical History:  Diagnosis Date   AKI (acute kidney injury) (HCC) 2018   "from overdose"   Anxiety    Chlamydia    Daily headache    Depression    Drug overdose 04/2016   [name] 05/01/2016   GERD (gastroesophageal reflux disease)    "when I was younger; gone now" (09/21/2017)   IV drug abuse (HCC) 09/20/2017   Migraine    "q couple weeks" (09/21/2017)   Opioid abuse (HCC)    Overdose    Stroke (HCC)      Medicines Meds ordered this encounter  Medications   lamoTRIgine  (LAMICTAL) tablet 100 mg    I have reviewed the patients home medicines and have made adjustments as needed  Problem List / ED Course: Problem List Items Addressed This Visit   None Visit Diagnoses     Seizure (HCC)    -  Primary   Relevant Medications   lamoTRIgine (LAMICTAL) tablet 100 mg (Completed)                   Final Clinical Impression(s) / ED Diagnoses Final diagnoses:  Seizure [facility])    Rx / DC Orders ED Discharge Orders     None         Keilly Fatula, Mayer Masker, MD 12/04/22 [phone]
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
## 2022-12-04 NOTE — ED Notes (Addendum)
Pt returned from ED. Pt states she is fatigued. Pt given fluids and is resting comfortably in Bed 2.

## 2022-12-04 NOTE — ED Notes (Signed)
Patient discharge to [facility] . Report givn to [place]

## 2022-12-04 NOTE — Progress Notes (Signed)
Pt responds when her name is called. Still in postictal period. Breathing unlabored. Pt resting. Waiting for EMS to arrive to take pt to [facility] for medical clearance.

## 2022-12-04 NOTE — ED Notes (Addendum)
Rn notified provider that patient had lamictal 100 mg at 138am this moring 12/04/22. Rn asked if provider wanted the patient have have the 10 am dose  of 100 mg of lamictal  since the medication is ordered once daily,provider states to give the 10 am dose even thought she had the dose at 138 am.

## 2022-12-04 NOTE — ED Notes (Signed)
Patient in milieu. Environment is secured. Will continue to monitor for safety. 

## 2022-12-04 NOTE — ED Notes (Signed)
Patient alert and oriented x 3. Denies SI/HI/AVH. Denies intent or plan to harm self or others. Routine conducted according to faculty protocol. Encourage patient to notify staff with any needs or concerns. Patient verbalized agreement and understanding. Will continue to monitor for safety. 

## 2022-12-05 DIAGNOSIS — I69398 Other sequelae of cerebral infarction: Secondary | ICD-10-CM | POA: Diagnosis not present

## 2022-12-05 DIAGNOSIS — G40909 Epilepsy, unspecified, not intractable, without status epilepticus: Secondary | ICD-10-CM | POA: Diagnosis not present

## 2022-12-05 MED ORDER — MIRTAZAPINE 7.5 MG PO TABS
7.5000 mg | ORAL_TABLET | Freq: Every day | ORAL | Status: DC
  Administered 2022-12-05: 7.5 mg via ORAL
  Filled 2022-12-05: qty 1

## 2022-12-05 NOTE — Group Note (Signed)
Group Topic: Recovery Basics  Group Date: 12/05/2022 Start Time: 1000 End Time: 1100 Facilitators: Londell Moh, NT  Department: [facility]  Number of Participants: 7  Group Focus: check in Treatment Modality:  Psychoeducation Interventions utilized were patient education and support Purpose: increase insight  Name: Jane Watkins Date of Birth: [birth year]  MR: [id number]    Level of Participation: active Quality of Participation: attentive Interactions with others: gave feedback Mood/Affect: appropriate Triggers (if applicable): n/a Cognition: coherent/clear Progress: Gaining insight Response: n/a Plan: patient will be encouraged to continue to attend groups  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   Cardiac arrest (HCC) 01/05/2022   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

## 2022-12-05 NOTE — ED Notes (Signed)
Pt resting in no acute distress. RR even and unlabored. Environment secured. Will continue to monitor for safety.

## 2022-12-05 NOTE — ED Notes (Signed)
Pt sleeping in no acute distress. RR even and unlabored. Environment secured. Will continue to monitor for safety. 

## 2022-12-05 NOTE — ED Notes (Signed)
 Pt was provided lunch

## 2022-12-05 NOTE — ED Notes (Signed)
Patient A&Ox4. Denies intent to harm self/others when asked. Denies A/VH. Patient denies any physical complaints when asked. No acute distress noted. Support and encouragement provided. Routine safety checks conducted according to facility protocol. Encouraged patient to notify staff if thoughts of harm toward self or others arise. Patient verbalize understanding and agreement. Will continue to monitor for safety.    

## 2022-12-05 NOTE — Group Note (Signed)
Group Topic: Change and Accountability  Group Date: 12/05/2022 Start Time: 0730 End Time: 0800 Facilitators: Hilma Favors, RN  Department: [facility]  Number of Participants: 3  Group Focus: acceptance Treatment Modality:  Behavior Modification Therapy Interventions utilized were support Purpose: express feelings  Name: Jane Watkins Date of Birth: 1995/04/13  MR: [id number]    Level of Participation: did not attend Quality of Participation: n/a Interactions with others: n/a Mood/Affect: n/a Triggers (if applicable): n/a Cognition: n/a Progress: None Response: n/a Plan: patient will be encouraged to attend groups  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   Cardiac arrest (HCC) 01/05/2022   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

## 2022-12-05 NOTE — ED Notes (Signed)
Pt is in his room resting in bed. Pt denies SI/HI/AVH. No acute distress noted. Will continue to monitor for safety and provide support.

## 2022-12-05 NOTE — ED Notes (Signed)
Pt was provided dinner.

## 2022-12-05 NOTE — Group Note (Signed)
Group Topic: Communication  Group Date: 12/05/2022 Start Time: 2000 End Time: 2030 Facilitators: Rae Lips B  Department: [facility]  Number of Participants: 1  Group Focus: abuse issues, acceptance, activities of daily living skills, communication, community group, coping skills, and daily focus Treatment Modality:  Exposure Therapy and Individual Therapy Interventions utilized were leisure development, problem solving, story telling, and support Purpose: enhance coping skills, express feelings, express irrational fears, improve communication skills, and relapse prevention strategies  Name: Jane Watkins Date of Birth: Mar 12, 1995  MR: [id number]    Level of Participation: PT DID NOT ATTEND GROUPS Quality of Participation: cooperative Interactions with others: gave feedback Mood/Affect: appropriate Triggers (if applicable): NA Cognition: coherent/clear Progress: Gaining insight Response: NA Plan: patient will be encouraged to go to groups.   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

## 2022-12-05 NOTE — ED Provider Notes (Signed)
Facility [facility] Crisis [facility] Progress Note  Date: 12/04/22 Patient Name: Jane Watkins MRN: [id number] Chief Complaint: "my boyfriend brought me here"  Diagnoses:  Final diagnoses:  Polysubstance abuse (HCC)  Episode of recurrent major depressive disorder, unspecified depression episode severity (HCC)  Seizure Disorder as sequela of cerebrovascular accident [facility])    HPI:  Chrislyn Gallatin, 27 y/o female with a history of Fentanyl abuse, CVA right MCA stroke 01/2019 with residual left spastic hemiparesis, hx of IV drug abuse with hospitalization 04/2016 with narcotic overdose and hypoxic ischemic encephalopathy with resultant cognitive impairment, anxiety with PTSD, and migraine headaches, insomnia, depression, polysubstance abuse, and hyperlipidemia who presents to the FBC/OBS with concern for seizure-like activity.   Patient seen this AM in her room. She was sleeping and roused easily to voice. Patient is requesting that she gets some clothes out of her bag that she brought with her to the hospital. She reports that she slept well and had no trouble falling asleep. She denies nightmares. Denies any side effects from medications or withdrawal symptoms: denies headaches, dizziness, changes in vision, nausea, vomiting, diarrhea, constipation. Denies signs of seizure activity: shakiness, tremor, lightheadedness. Reports she has had some dry mouth and watery eyes. Her chronic nerve pain this morning is rated 2/10. Denies SI/HI/AVH.    Psychiatric ROS Mood Symptoms Depression: dysphoria, passive SI, anhedonia, guilt, trouble sleeping, mainly sleeps with Z-quil, hopelessness  Manic Symptoms Has in the past, unclear if substance induced or not  Anxiety Symptoms Denies excessive worry  Trauma Symptoms Exposure to actual or threatened death, serious injury, or sexual violence; intrusive symptoms (e.g., flashbacks, distressing memories, or dreams); avoidance of stimuli associated with the trauma;  negative alterations in cognitions and mood (e.g., inability to remember aspects of the trauma, persistent negative beliefs, or emotional numbing); marked alterations in arousal and reactivity (e.g., hypervigilance, exaggerated startle response, irritability, or sleep disturbance)  Emotional, verbal, physical, sexual abuse  Psychosis Symptoms Denies AVH   Substance Use Hx: Alcohol: denies Tobacco: denies Cannabis:on and off Cocaine: daily since March 2024 Methamphetamines: denies Psilocybin (mushrooms): every couple weeks Ecstasy (MDMA / molly): denies LSD (acid): never tried: denies Opiates (fentanyl / heroin): fentanyl twice a year Benzos (Xanax, Klonopin): xanax occasionally Prescribed meds abuse: denies IV Drug Use Hx: denies, yes in the past last use 2020 Rehab hx: been through multiple rehabs but relapses quickly, most recent [facility] center in December last year  Past Psychiatric Hx: Current Psychiatrist: at [facility], seen by Dr. Jola Babinski in 2020 Current Therapist: enrolled in virtual IOP (therapist Velda Shell) and group therapy at [facility] Previous Psychiatric Diagnoses: substance use, opiate abuse, MDD, bipolar disorder Current psychiatric medications: hydroxyzine 50 mg Q6H prn, buspirone 10 mg TID, lamictal 100 mg BID, trileptal 150 mg BID, seroquel 50 mg nightly Psychiatric medication history/compliance: trials of pristiq, zoloft, fluoxetine  Psychiatric Hospitalization hx: Has 2 prior psych hospitalizations, most recently discharged from [facility] (12/2021)  Psychotherapy hx: enrolled in virtual IOP and group therapy at [facility] Neuromodulation history: denies History of suicide: denies prior suicide attempts History of homicide or aggression: denies   Past Medical History: PCP: with [facility] in the past, looking to switch Medical Dx:TBI (2018), HIE (2018), CVA (2020), RMCA (2021), dyslipidemia, spastic hemiparesis, cardiac arrest (2023) Medications: baclofen 10 mg  TID, atorvastatin 20 mg daily, gabapentin 300 mg nightly, protonix 40 mg daily, propranolol 10 mg BID Allergies: desvenlafaxine (rash), sulfa drugs (rash) Seizures: history of seizure disorder, on lamictal  POCT urine pregnancy  test negative Contraceptives: IUD  Family Medical History: Anemia - father Lung Cancer - maternal grandmother, paternal grandfather Thyroid Cancer - maternal grandmother  Family Psychiatric History: None  Social History: Living Situation: lives with her boyfriend of 2 years in [place] Social Support: father, mother, boyfriend Education: graduated [employer] information [employer], 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 Occupational hx: on disability Marital Status: dating partner of 2 years Children: none Legal: finished probation October 1st 2024 for cocaine charges Military: denies  Access to firearms: denies   PHQ 2-9:  Flowsheet Row ED from 12/04/2022 in [facility] Office Visit from 05/24/2019 in [facility]  Thoughts that you would be better off dead, or of hurting yourself in some way Nearly every day Nearly every day  PHQ-9 Total Score 24 21       Flowsheet Row ED from 12/04/2022 in [facility] Most recent reading at 12/04/2022  5:45 AM ED from 12/04/2022 in [facility] Emergency Department at [facility] Most recent reading at 12/04/2022  1:23 AM ED from 12/03/2022 in [facility] Most recent reading at 12/03/2022 10:52 PM  C-SSRS RISK CATEGORY High Risk No Risk High Risk         Total Time spent with patient: 30 minutes  Musculoskeletal  Strength & Muscle Tone: patient had a CVA and has tonic constriction of L arm Gait & Station: impaired baseline gait due to CVA history and spastic L side hemiparesis Patient leans: left  Psychiatric Specialty Exam  Presentation General Appearance:  Appropriate for environment  Eye  Contact: Good  Speech: Clear and Coherent  Speech Volume: Normal  Handedness: Spastic hemiparesis of L side   Mood and Affect  Mood: Dysphoric  Affect: Constricted   Thought Process  Thought Processes: Coherent  Descriptions of Associations:Intact  Orientation:Full (Time, Place and Person)  Thought Content:WDL  Diagnosis of Schizophrenia or Schizoaffective disorder in past: No   Hallucinations:Hallucinations: None  Ideas of Reference:None  Suicidal Thoughts:Suicidal Thoughts: no   Homicidal Thoughts:Homicidal Thoughts: No   Sensorium  Memory: Immediate Good  Judgment: Fair  Insight: Fair   Art therapist  Concentration: Good  Attention Span: Good  Recall: Good  Fund of Knowledge: Good  Language: Good   Psychomotor Activity  Psychomotor Activity: Psychomotor Activity:  L side spastic hemiparesis causes minor gait disturbance   Assets  Assets: Communication Skills; Social Support   Sleep  Sleep: Sleep: Good Number of Hours of Sleep: 8   Nutritional Assessment (For OBS and FBC admissions only) Has the patient had a weight loss or gain of 10 pounds or more in the last 3 months?: No Has the patient had a decrease in food intake/or appetite?: No Does the patient have dental problems?: No Does the patient have eating habits or behaviors that may be indicators of an eating disorder including binging or inducing vomiting?: No Has the patient recently lost weight without trying?: 0 Has the patient been eating poorly because of a decreased appetite?: 0 Malnutrition Screening Tool Score: 0   Review of Systems  All other systems reviewed and are negative. Physical Exam Constitutional:      Appearance: Normal appearance.  HENT:     Head: Normocephalic and atraumatic.  Pulmonary:     Effort: Pulmonary effort is normal.  Neurological:     Mental Status: She is alert and oriented to person, place, and time.     Comments: L  side spastic hemiparesis  from hx of CVA  Psychiatric:        Thought Content: Thought content normal.      Blood pressure 105/66, pulse 70, temperature 98.5 F (36.9 C), temperature source Oral, resp. rate 18, SpO2 98%. There is no height or weight on file to calculate BMI.    Is the patient at risk to self? Yes  Has the patient been a risk to self in the past 6 months? Yes .    Has the patient been a risk to self within the distant past? Yes   Is the patient a risk to others? No   Has the patient been a risk to others in the past 6 months? No   Has the patient been a risk to others within the distant past? No     Last Labs:  Admission on 12/03/2022, Discharged on 12/04/2022  Component Date Value Ref Range Status   WBC 12/03/2022 8.4  4.0 - 10.5 K/uL Final   RBC 12/03/2022 4.34  3.87 - 5.11 MIL/uL Final   Hemoglobin 12/03/2022 13.1  12.0 - 15.0 g/dL Final   HCT 78/46/9629 39.2  36.0 - 46.0 % Final   MCV 12/03/2022 90.3  80.0 - 100.0 fL Final   MCH 12/03/2022 30.2  26.0 - 34.0 pg Final   MCHC 12/03/2022 33.4  30.0 - 36.0 g/dL Final   RDW 52/84/1324 12.9  11.5 - 15.5 % Final   Platelets 12/03/2022 273  150 - 400 K/uL Final   nRBC 12/03/2022 0.0  0.0 - 0.2 % Final   Neutrophils Relative % 12/03/2022 59  % Final   Neutro Abs 12/03/2022 4.9  1.7 - 7.7 K/uL Final   Lymphocytes Relative 12/03/2022 31  % Final   Lymphs Abs 12/03/2022 2.6  0.7 - 4.0 K/uL Final   Monocytes Relative 12/03/2022 8  % Final   Monocytes Absolute 12/03/2022 0.7  0.1 - 1.0 K/uL Final   Eosinophils Relative 12/03/2022 1  % Final   Eosinophils Absolute 12/03/2022 0.1  0.0 - 0.5 K/uL Final   Basophils Relative 12/03/2022 1  % Final   Basophils Absolute 12/03/2022 0.1  0.0 - 0.1 K/uL Final   Immature Granulocytes 12/03/2022 0  % Final   Abs Immature Granulocytes 12/03/2022 0.02  0.00 - 0.07 K/uL Final   Performed at [facility], [address] [place], [place] [address]   Sodium 12/03/2022 135  135  - 145 mmol/L Final   Potassium 12/03/2022 3.5  3.5 - 5.1 mmol/L Final   Chloride 12/03/2022 102  98 - 111 mmol/L Final   CO2 12/03/2022 19 (L)  22 - 32 mmol/L Final   Glucose, Bld 12/03/2022 76  70 - 99 mg/dL Final   Glucose reference range applies only to samples taken after fasting for at least 8 hours.   BUN 12/03/2022 8  6 - 20 mg/dL Final   Creatinine, Ser 12/03/2022 0.46  0.44 - 1.00 mg/dL Final   Calcium 72/53/6644 9.6  8.9 - 10.3 mg/dL Final   Total Protein 03/47/4259 6.4 (L)  6.5 - 8.1 g/dL Final   Albumin 56/38/7564 4.5  3.5 - 5.0 g/dL Final   AST 33/29/5188 16  15 - 41 U/L Final   ALT 12/03/2022 14  0 - 44 U/L Final   Alkaline Phosphatase 12/03/2022 37 (L)  38 - 126 U/L Final   Total Bilirubin 12/03/2022 1.9 (H)  0.3 - 1.2 mg/dL Final   GFR, Estimated 12/03/2022 >60  >60 mL/min Final  Comment: (NOTE) Calculated using the CKD-EPI Creatinine Equation (2021)    Anion gap 12/03/2022 14  5 - 15 Final   Performed at [facility], [address], [place], [place] [address]   Alcohol, Ethyl (B) 12/03/2022 <10  <10 mg/dL Final   Comment: (NOTE) Lowest detectable limit for serum alcohol is 10 mg/dL.  For medical purposes only. Performed at [facility], [address] [place], [place] [address]    TSH 12/03/2022 0.996  0.350 - 4.500 uIU/mL Final   Comment: Performed by a 3rd Generation assay with a functional sensitivity of <=0.01 uIU/mL. Performed at [facility], [address] [place], [place] [address]    Preg Test, Ur 12/03/2022 Negative  Negative Final   POC Amphetamine UR 12/03/2022 None Detected  NONE DETECTED (Cut Off Level 1000 ng/mL) Final   POC Secobarbital (BAR) 12/03/2022 None Detected  NONE DETECTED (Cut Off Level 300 ng/mL) Final   POC Buprenorphine (BUP) 12/03/2022 None Detected  NONE DETECTED (Cut Off Level 10 ng/mL) Final   POC Oxazepam (BZO) 12/03/2022 Positive (A)  NONE DETECTED (Cut Off Level 300 ng/mL) Final   POC Cocaine UR  12/03/2022 None Detected  NONE DETECTED (Cut Off Level 300 ng/mL) Final   POC Methamphetamine UR 12/03/2022 None Detected  NONE DETECTED (Cut Off Level 1000 ng/mL) Final   POC Morphine 12/03/2022 None Detected  NONE DETECTED (Cut Off Level 300 ng/mL) Final   POC Methadone UR 12/03/2022 None Detected  NONE DETECTED (Cut Off Level 300 ng/mL) Final   POC Oxycodone UR 12/03/2022 None Detected  NONE DETECTED (Cut Off Level 100 ng/mL) Final   POC Marijuana UR 12/03/2022 Positive (A)  NONE DETECTED (Cut Off Level 50 ng/mL) Final    Allergies: Pristiq [desvenlafaxine succinate er], Desvenlafaxine, Other, and Sulfa antibiotics  Medications:  Facility Ordered Medications  Medication   [COMPLETED] lamoTRIgine (LAMICTAL) tablet 100 mg   acetaminophen (TYLENOL) tablet 650 mg   alum & mag hydroxide-simeth (MAALOX/MYLANTA) 200-200-20 MG/5ML suspension 30 mL   magnesium hydroxide (MILK OF MAGNESIA) suspension 30 mL   OLANZapine zydis (ZYPREXA) disintegrating tablet 10 mg   And   LORazepam (ATIVAN) tablet 1 mg   And   ziprasidone (GEODON) injection 20 mg   lamoTRIgine (LAMICTAL) tablet 100 mg   nicotine (NICODERM CQ - dosed in mg/24 hours) patch 14 mg   propranolol (INDERAL) tablet 10 mg   PTA Medications  Medication Sig   hydrOXYzine (ATARAX) 50 MG tablet Take 1 tablet (50 mg total) by mouth every 6 (six) hours as needed for anxiety.   baclofen (LIORESAL) 10 MG tablet Take 1 tablet (10 mg total) by mouth 3 (three) times daily.   busPIRone (BUSPAR) 10 MG tablet Take 1 tablet (10 mg total) by mouth 3 (three) times daily. (Patient not taking: Reported on 12/04/2022)   QUEtiapine (SEROQUEL) 50 MG tablet Take 1 tablet (50 mg total) by mouth at bedtime. (Patient not taking: Reported on 12/04/2022)   gabapentin (NEURONTIN) 300 MG capsule Take 1 capsule (300 mg total) by mouth at bedtime. (Patient not taking: Reported on 12/04/2022)   OXcarbazepine (TRILEPTAL) 150 MG tablet Take 1 tablet (150 mg total) by  mouth 2 (two) times daily. (Patient not taking: Reported on 12/04/2022)   pantoprazole (PROTONIX) 40 MG tablet Take 40 mg by mouth daily. (Patient not taking: Reported on 12/04/2022)   lamoTRIgine (LAMICTAL) 100 MG tablet Take 100 mg by mouth 2 (two) times daily. (Patient not taking: Reported on  12/04/2022)    Long Term Goals: Improvement in symptoms so as ready for discharge  Short Term Goals: Patient will verbalize feelings in meetings with treatment team members., Patient will attend at least of 50% of the groups daily., Pt will complete the PHQ9 on admission, day 3 and discharge., Patient will participate in completing the [place] Suicide Severity Rating Scale, Patient will score a low risk of violence for 24 hours prior to discharge, and Patient will take medications as prescribed daily.  Medical Decision Making  Psychiatric Diagnoses: Polysubstance Abuse Recurrent MDD vs Substance Induced Mood Disorder Seizure disorder  Oluwatofunmi Gironda, 27 y/o female with a history of Fentanyl abuse, CVA right MCA stroke 01/2019 with residual left spastic hemiparesis, hx of IV drug abuse with hospitalization 04/2016 with narcotic overdose and hypoxic ischemic encephalopathy with resultant cognitive impairment, anxiety with PTSD, and migraine headaches, insomnia, depression, polysubstance abuse, and hyperlipidemia who presents to the FBC/OBS with concern for seizure-like activity. Patient's presentation of dysphoria, anhedonia, hopelessness, guilt, passive SI, and insomnia in the setting of polysubstance use is consistent with recurrent MDD in the context of polysubstance use including fentanyl, cocaine, and THC. It is unclear if this is a true MDD recurrent episode, versus substance induced mood disorder. We will restart patient's home medications at this time and monitor for seizure activity or withdrawal symptoms. Considering patient's failed trials of prozac, zoloft, and pristiq in the past, we will start  mirtazapine this evening to improve mood, appetite, and sleep.   Psychiatric Diagnoses and Treatment:   # Polysubstance Abuse Strongly encourage patient to stop all fentanyl, cocaine, and THC use Monitor vitals and symptoms for opioid withdrawal Continue loperamide 2-4 mg prn for diarrhea or loose stools Continue milk of magnesia 30 mL daily prn for constipation Continue zofran 4 mg Q6H prn for nausea/vomiting Continue ativan 1 mg Q6H prn for CIWA > 10   # Recurrent MDD, moderate vs Substance Induced Mood disorder Start mirtazapine 7.5 mg nightly at bedtime for mood, sleep, and appetite Continue hydroxyzine 50 mg prn for anxiety   # Seizure Disorder Continue lamotrigine 100 mg BID for seizures and impulsivity Consider lipid panel tomorrow    # Neuropathic Pain Start baclofen 10 mg TID for nerve pain     Other Labs/Imaging Reviewed: UDS positive for benzodiazepines and THC BAL < 10  EKG on 12/03/2022: QTc 424   Disposition: will assess with primary team      Recommendations  Based on my evaluation the patient does not appear to have an emergency medical condition.      Carolan Shiver, Medical Student 12/05/22 [phone]

## 2022-12-05 NOTE — Group Note (Signed)
Group Topic: Overcoming Obstacles  Group Date: 12/05/2022 Start Time: 1700 End Time: 1730 Facilitators: Prentice Docker, RN  Department: [facility]  Number of Participants: 7  Group Focus: nursing group Treatment Modality:  Psychoeducation Interventions utilized were mental fitness Purpose: enhance coping skills  Name: Jane Watkins Date of Birth: 05-30-1995  MR: [id number]    Level of Participation: active Quality of Participation: cooperative Interactions with others: gave feedback Mood/Affect: appropriate Triggers (if applicable): none identified Cognition: coherent/clear Progress: Significant Response: "my coping skills to comfort me are my dog, cleaning the house and cooking" Plan: patient will be encouraged to utilize learned coping skills to manage substance cravings  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   Cardiac arrest (HCC) 01/05/2022   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

## 2022-12-05 NOTE — ED Notes (Addendum)
Patient is sleeping. Respirations equal and unlabored, skin warm and dry. No change in assessment or acuity. Routine safety checks conducted according to facility protocol. Will continue to monitor for safety.   

## 2022-12-06 ENCOUNTER — Inpatient Hospital Stay (HOSPITAL_COMMUNITY): Admit: 2022-12-06 | Payer: Medicare HMO | Admitting: Psychiatry

## 2022-12-06 DIAGNOSIS — F339 Major depressive disorder, recurrent, unspecified: Secondary | ICD-10-CM | POA: Diagnosis not present

## 2022-12-06 DIAGNOSIS — F1910 Other psychoactive substance abuse, uncomplicated: Secondary | ICD-10-CM | POA: Diagnosis not present

## 2022-12-06 DIAGNOSIS — G40909 Epilepsy, unspecified, not intractable, without status epilepticus: Secondary | ICD-10-CM | POA: Diagnosis not present

## 2022-12-06 DIAGNOSIS — I69398 Other sequelae of cerebral infarction: Secondary | ICD-10-CM

## 2022-12-06 MED ORDER — MIRTAZAPINE 7.5 MG PO TABS: 7.5000 mg | ORAL_TABLET | Freq: Every day | ORAL | 0 refills | Status: AC

## 2022-12-06 MED ORDER — NICOTINE 14 MG/24HR TD PT24
14.0000 mg | MEDICATED_PATCH | Freq: Every day | TRANSDERMAL | Status: DC
  Administered 2022-12-06: 14 mg via TRANSDERMAL
  Filled 2022-12-06: qty 1

## 2022-12-06 MED ORDER — NICOTINE 14 MG/24HR TD PT24: 14.0000 mg | MEDICATED_PATCH | Freq: Every day | TRANSDERMAL | 0 refills | Status: AC

## 2022-12-06 NOTE — Discharge Instructions (Signed)
Patient is instructed prior to discharge to:  Take all medications as prescribed by his/her mental healthcare provider. Report any adverse effects and or reactions from the medicines to his/her outpatient provider promptly. Keep all scheduled appointments, to ensure that you are getting refills on time and to avoid any interruption in your medication.  If you are unable to keep an appointment call to reschedule.  Be sure to follow-up with resources and follow-up appointments provided.  Patient has been instructed & cautioned: To not engage in alcohol and or illegal drug use while on prescription medicines. In the event of worsening symptoms, patient is instructed to call the crisis hotline, 911 and or go to the nearest ED for appropriate evaluation and treatment of symptoms. To follow-up with his/her primary care provider for your other medical issues, concerns and or health care needs.    

## 2022-12-06 NOTE — ED Notes (Signed)
Patient is sleeping. Respirations equal and unlabored, skin warm and dry, NAD. No change in assessment or acuity. Routine safety checks conducted according to facility protocol. Will continue to monitor for safety.   

## 2022-12-06 NOTE — ED Provider Notes (Signed)
[facility] ASAP Discharge Summary  Date and Time: 12/06/2022 2:07 PM  Name: Jane Watkins  MRN:  [id number]   Discharge Diagnoses:  Final diagnoses:  Seizure disorder as sequela of cerebrovascular accident [facility])  Substance induced mood disorder (HCC)  Cocaine abuse (HCC)    Subjective:  The patient is a 27 year old female with a past psychiatric history of opioid use disorder and CVA occurring in 2020, which has resulted in left-sided deficits.  Patient also has a seizure disorder.  She presented to the [facility] behavioral health urgent care accompanied by her partner requesting substance use treatment (specifically cocaine abuse).  At that time she also reported suicidal thoughts with a plan to overdose on medications.  It is notable that the patient had a serious suicide attempt via overdose on fentanyl in November 2023, which involved an ICU stay, and eventually psychiatric hospitalization.  Stay Summary:  The patient was admitted to the facility based crisis.  She was restarted on home medications.  Remeron 7.5 mg nightly was added to help with sleep.  The patient denied suicidal ideations after admission and the day following.  She requested discharge feeling that her stay was no longer productive for her mental health.  Because of the patient's history of a serious suicide attempt in 2023 and her presenting with suicidal thoughts, I contacted her partner to further discuss the patient's situation.  [name], [phone].  He confirms the patient's history, described as follows.  They have been in a relationship for approximately 1 to 2 years, and have been living together for the past 3 months.  He reports that he works a Youth worker job and is able to make ends meet.  He states that the patient went to [facility] treatment center following her hospitalization and did fairly well up until several weeks ago when she started abusing cocaine.  He states that the patient has not made any  suicidal statements or attempted suicide since the psychiatric hospitalization in November.  He denies the presence of any firearms in the home.  He does not have safety concerns with the patient coming home today.  During my interviews with the patient today, she has appeared future oriented and is interested in reducing her use of cocaine.  She denies experiencing any withdrawal symptoms and states that she feels she can come back for help if she should need it.  She denies experiencing any form of suicidal thoughts.  Because the patient is being discharged unexpectedly over the weekend, there is limited follow-up planning available because there is no Child psychotherapist on the unit.  The patient denies experiencing any rash recently, and she was told to present for care should she develop one in the near future.  Total Time spent with patient: 50 minutes  Past Psychiatric History: as above Past Medical History: as above Family History: none Family Psychiatric History: none Social History: as above Tobacco Cessation:  A prescription for an FDA-approved tobacco cessation medication provided at discharge   Current Medications:  Current Facility-Administered Medications  Medication Dose Route Frequency Provider Last Rate Last Admin   acetaminophen (TYLENOL) tablet 650 mg  650 mg Oral Q6H PRN Nelly Rout, MD       alum & mag hydroxide-simeth (MAALOX/MYLANTA) 200-200-20 MG/5ML suspension 30 mL  30 mL Oral Q4H PRN Nelly Rout, MD       baclofen (LIORESAL) tablet 10 mg  10 mg Oral TID Lorri Frederick, MD   10 mg at 12/06/22 [phone]  hydrOXYzine (ATARAX) tablet 50 mg  50 mg Oral TID PRN Carrion-Carrero, Karle Starch, MD       lamoTRIgine (LAMICTAL) tablet 100 mg  100 mg Oral BID Nelly Rout, MD   100 mg at 12/06/22 0950   loperamide (IMODIUM) capsule 2-4 mg  2-4 mg Oral PRN Carrion-Carrero, Margely, MD       LORazepam (ATIVAN) tablet 1 mg  1 mg Oral Q6H PRN Carrion-Carrero, Margely, MD        magnesium hydroxide (MILK OF MAGNESIA) suspension 30 mL  30 mL Oral Daily PRN Nelly Rout, MD       mirtazapine (REMERON) tablet 7.5 mg  7.5 mg Oral QHS Lauro Franklin, MD   7.5 mg at 12/05/22 2102   nicotine (NICODERM CQ - dosed in mg/24 hours) patch 14 mg  14 mg Transdermal Daily [name], MD   14 mg at 12/06/22 1009   ondansetron (ZOFRAN-ODT) disintegrating tablet 4 mg  4 mg Oral Q6H PRN Carrion-Carrero, Karle Starch, MD       Current Outpatient Medications  Medication Sig Dispense Refill   baclofen (LIORESAL) 10 MG tablet Take 1 tablet (10 mg total) by mouth 3 (three) times daily. 90 tablet 0   hydrOXYzine (ATARAX) 50 MG tablet Take 1 tablet (50 mg total) by mouth every 6 (six) hours as needed for anxiety. 60 tablet 0   lamoTRIgine (LAMICTAL) 100 MG tablet Take 100 mg by mouth 2 (two) times daily. (Patient not taking: Reported on 12/04/2022)     mirtazapine (REMERON) 7.5 MG tablet Take 1 tablet (7.5 mg total) by mouth at bedtime. 30 tablet 0   [START ON 12/07/2022] nicotine (NICODERM CQ - DOSED IN MG/24 HOURS) 14 mg/24hr patch Place 1 patch (14 mg total) onto the skin daily. 28 patch 0    PTA Medications:  Facility Ordered Medications  Medication   [COMPLETED] lamoTRIgine (LAMICTAL) tablet 100 mg   acetaminophen (TYLENOL) tablet 650 mg   alum & mag hydroxide-simeth (MAALOX/MYLANTA) 200-200-20 MG/5ML suspension 30 mL   magnesium hydroxide (MILK OF MAGNESIA) suspension 30 mL   [COMPLETED] lamoTRIgine (LAMICTAL) tablet 100 mg   lamoTRIgine (LAMICTAL) tablet 100 mg   baclofen (LIORESAL) tablet 10 mg   hydrOXYzine (ATARAX) tablet 50 mg   LORazepam (ATIVAN) tablet 1 mg   loperamide (IMODIUM) capsule 2-4 mg   ondansetron (ZOFRAN-ODT) disintegrating tablet 4 mg   mirtazapine (REMERON) tablet 7.5 mg   nicotine (NICODERM CQ - dosed in mg/24 hours) patch 14 mg   PTA Medications  Medication Sig   hydrOXYzine (ATARAX) 50 MG tablet Take 1 tablet (50 mg total) by mouth every 6  (six) hours as needed for anxiety.   baclofen (LIORESAL) 10 MG tablet Take 1 tablet (10 mg total) by mouth 3 (three) times daily.   mirtazapine (REMERON) 7.5 MG tablet Take 1 tablet (7.5 mg total) by mouth at bedtime.   [START ON 12/07/2022] nicotine (NICODERM CQ - DOSED IN MG/24 HOURS) 14 mg/24hr patch Place 1 patch (14 mg total) onto the skin daily.       12/06/2022    2:05 PM 12/04/2022    4:42 PM 12/04/2022    4:36 PM  Depression screen PHQ 2/9  Decreased Interest 1 3 3   Down, Depressed, Hopeless 1 3 3   PHQ - 2 Score 2 6 6   Altered sleeping 0 2 2  Tired, decreased energy 0 3 3  Change in appetite 0 2 2  Feeling bad or failure about yourself  0 3 3  Trouble concentrating 0 3 3  Moving slowly or fidgety/restless 0 2 2  Suicidal thoughts 0 3 3  PHQ-9 Score 2 24 24   Difficult doing work/chores  Extremely dIfficult Extremely dIfficult    Flowsheet Row ED from 12/04/2022 in [facility] Most recent reading at 12/04/2022  2:26 PM ED from 12/04/2022 in [facility] Most recent reading at 12/04/2022  5:45 AM ED from 12/04/2022 in [facility] Emergency Department at [facility] Most recent reading at 12/04/2022  1:23 AM  C-SSRS RISK CATEGORY No Risk High Risk No Risk      Musculoskeletal  Strength & Muscle Tone: within normal limits Gait & Station: normal Patient leans: N/A  Psychiatric Specialty Exam  Presentation General Appearance: Appropriate for Environment  Eye Contact:Fair  Speech:Clear and Coherent  Speech Volume:Normal  Handedness:-- (not assessed)   Mood and Affect  Mood:Euthymic  Affect:Congruent   Thought Process  Thought Processes:Coherent; Linear  Descriptions of Associations:Intact  Orientation:Full (Time, Place and Person)  Thought Content:Logical    Hallucinations:Hallucinations: None  Ideas of Reference:None  Suicidal Thoughts:Suicidal Thoughts: No  Homicidal  Thoughts:Homicidal Thoughts: No   Sensorium  Memory:Immediate Fair; Recent Fair; Remote Fair  Judgment:Fair  Insight:Fair   Executive Functions  Concentration:Fair  Attention Span:Fair  Recall:Fair  Fund of Knowledge:Fair  Language:Fair   Psychomotor Activity  Psychomotor Activity:Psychomotor Activity: Normal   Assets  Assets:Communication Skills; Resilience   Sleep  Sleep:Sleep: Fair    Physical Exam Constitutional:      Appearance: the patient is not toxic-appearing.  Pulmonary:     Effort: Pulmonary effort is normal.  Neurological:     General: No focal deficit present.     Mental Status: the patient is alert and oriented to person, place, and time.   Review of Systems  Respiratory:  Negative for shortness of breath.   Cardiovascular:  Negative for chest pain.  Gastrointestinal:  Negative for abdominal pain, constipation, diarrhea, nausea and vomiting.  Neurological:  Negative for headaches.    BP 104/65   Pulse 69   Temp 98 F (36.7 C) (Oral)   Resp 18   SpO2 99%   Demographic Factors:  None pertinent   Loss Factors: NA   Historical Factors: NA   Risk Reduction Factors:   Positive social support, Positive therapeutic relationship, and Positive coping skills or problem solving skills   Continued Clinical Symptoms:  Substance use    Cognitive Features That Contribute To Risk:  None     Suicide Risk:  Mild   Plan Of Care/Follow-up recommendations:  Activity as tolerated. Diet as recommended by PCP. Keep all scheduled follow-up appointments as recommended.   Patient is instructed to take all prescribed medications as recommended. Report any side effects or adverse reactions to your outpatient psychiatrist. Patient is instructed to abstain from alcohol and illegal drugs while on prescription medications. In the event of worsening symptoms, patient is instructed to call the crisis hotline, 911, or go to the nearest emergency  department for evaluation and treatment.     Patient is also instructed prior to discharge to: Take all medications as prescribed by mental healthcare provider. Report any adverse effects and or reactions from the medicines to outpatient provider promptly. Patient has been instructed & cautioned: To not engage in alcohol and or illegal drug use while on prescription medicines. In the event of worsening symptoms,  patient is instructed to call the crisis hotline, 911 and or go to the nearest  ED for appropriate evaluation and treatment of symptoms. To follow-up with primary care provider for other medical issues, concerns and or health care needs    Disposition: Self-care    [name], MD 12/06/2022, 2:07 PM

## 2022-12-06 NOTE — ED Notes (Signed)
Per MD, writer informed pt that she would be able to discharge around 1400 today as a safety plan has been discussed with her partner in the home. Pt verbalized understanding and agreement. Will continue to monitor for safety.

## 2022-12-06 NOTE — ED Notes (Signed)
Patient A&O x 4, ambulatory. Patient discharged in no acute distress. Patient denied SI/HI, A/VH upon discharge. Patient verbalized understanding of all discharge instructions explained by staff, to include follow up appointments and safety plan. Patient reported mood 10/10.  Pt belongings returned to patient from locker # 9 intact. Patient escorted to lobby via staff for transport to destination. Safety maintained.

## 2022-12-06 NOTE — Group Note (Signed)
Group Topic: Communication  Group Date: 12/06/2022 Start Time: 1400 End Time: 1415 Facilitators: Prentice Docker, RN  Department: [facility]  Number of Participants: 1  Group Focus: discharge education Treatment Modality:  Psychoeducation Interventions utilized were patient education Purpose: relapse prevention strategies  Name: Jane Watkins Date of Birth: [birth year]  MR: [id number]    Level of Participation: active Quality of Participation: attentive Interactions with others: gave feedback Mood/Affect: appropriate Triggers (if applicable): none identified Cognition: coherent/clear Progress: Significant Response: pt verbalized understanding and agreement to discharge education reviewed o AVS by staff Plan: patient will be encouraged to continue learned skills to manage cravings after discharge  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   Cardiac arrest (HCC) 01/05/2022   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

## 2022-12-06 NOTE — ED Notes (Signed)
Patient A&Ox4. Denies intent to harm self/others when asked. Denies A/VH. Patient denies any physical complaints when asked. Pt states, "I just get tired of the thoughts I have in my head when I'm not here. I use the drugs to block out things but I can't figure out why I keep going back to it. Maybe I need to be at [facility]. I'm not sure". Support and encouragement provided. Routine safety checks conducted according to facility protocol. Encouraged patient to notify staff if thoughts of harm toward self or others arise. Patient verbalize understanding and agreement. Will continue to monitor for safety.

## 2022-12-06 NOTE — ED Notes (Signed)
Patient is sleeping. Respirations equal and unlabored, skin warm and dry. No change in assessment or acuity. Routine safety checks conducted according to facility protocol. Will continue to monitor for safety.   

## 2023-01-27 ENCOUNTER — Encounter: Payer: Medicare HMO | Attending: Physical Medicine and Rehabilitation | Admitting: Registered Nurse

## 2023-09-14 ENCOUNTER — Ambulatory Visit: Admitting: Physical Medicine and Rehabilitation

## 2023-09-21 ENCOUNTER — Encounter: Attending: Physical Medicine and Rehabilitation | Admitting: Physical Medicine and Rehabilitation
